# Patient Record
Sex: Male | Born: 1950 | Race: White | Hispanic: No | Marital: Married | Smoking: Former smoker
Health system: Southern US, Community
[De-identification: ages and names within clinical notes are randomized; demographics above are authoritative.]

## PROBLEM LIST (undated history)

## (undated) DIAGNOSIS — I472 Ventricular tachycardia: Secondary | ICD-10-CM

## (undated) DIAGNOSIS — N186 End stage renal disease: Secondary | ICD-10-CM

## (undated) DIAGNOSIS — D497 Neoplasm of unspecified behavior of endocrine glands and other parts of nervous system: Secondary | ICD-10-CM

## (undated) DIAGNOSIS — Z95810 Presence of automatic (implantable) cardiac defibrillator: Secondary | ICD-10-CM

## (undated) DIAGNOSIS — E1142 Type 2 diabetes mellitus with diabetic polyneuropathy: Secondary | ICD-10-CM

## (undated) DIAGNOSIS — E1149 Type 2 diabetes mellitus with other diabetic neurological complication: Secondary | ICD-10-CM

## (undated) DIAGNOSIS — F32A Depression, unspecified: Secondary | ICD-10-CM

## (undated) DIAGNOSIS — I428 Other cardiomyopathies: Secondary | ICD-10-CM

## (undated) DIAGNOSIS — N184 Chronic kidney disease, stage 4 (severe): Secondary | ICD-10-CM

## (undated) DIAGNOSIS — IMO0002 Reserved for concepts with insufficient information to code with codable children: Secondary | ICD-10-CM

## (undated) DIAGNOSIS — F4310 Post-traumatic stress disorder, unspecified: Secondary | ICD-10-CM

## (undated) DIAGNOSIS — R55 Syncope and collapse: Secondary | ICD-10-CM

## (undated) DIAGNOSIS — Z992 Dependence on renal dialysis: Secondary | ICD-10-CM

## (undated) DIAGNOSIS — Z8719 Personal history of other diseases of the digestive system: Secondary | ICD-10-CM

## (undated) DIAGNOSIS — I2510 Atherosclerotic heart disease of native coronary artery without angina pectoris: Secondary | ICD-10-CM

## (undated) DIAGNOSIS — L89153 Pressure ulcer of sacral region, stage 3: Secondary | ICD-10-CM

## (undated) DIAGNOSIS — E669 Obesity, unspecified: Secondary | ICD-10-CM

## (undated) DIAGNOSIS — Z950 Presence of cardiac pacemaker: Secondary | ICD-10-CM

## (undated) DIAGNOSIS — F40240 Claustrophobia: Secondary | ICD-10-CM

## (undated) DIAGNOSIS — D472 Monoclonal gammopathy: Secondary | ICD-10-CM

## (undated) DIAGNOSIS — M1990 Unspecified osteoarthritis, unspecified site: Secondary | ICD-10-CM

## (undated) DIAGNOSIS — J9610 Chronic respiratory failure, unspecified whether with hypoxia or hypercapnia: Secondary | ICD-10-CM

## (undated) DIAGNOSIS — I739 Peripheral vascular disease, unspecified: Secondary | ICD-10-CM

## (undated) DIAGNOSIS — I480 Paroxysmal atrial fibrillation: Secondary | ICD-10-CM

## (undated) DIAGNOSIS — D649 Anemia, unspecified: Secondary | ICD-10-CM

## (undated) DIAGNOSIS — N529 Male erectile dysfunction, unspecified: Secondary | ICD-10-CM

## (undated) DIAGNOSIS — R760 Raised antibody titer: Secondary | ICD-10-CM

## (undated) DIAGNOSIS — J449 Chronic obstructive pulmonary disease, unspecified: Secondary | ICD-10-CM

## (undated) DIAGNOSIS — I219 Acute myocardial infarction, unspecified: Secondary | ICD-10-CM

## (undated) DIAGNOSIS — I639 Cerebral infarction, unspecified: Secondary | ICD-10-CM

## (undated) DIAGNOSIS — M549 Dorsalgia, unspecified: Secondary | ICD-10-CM

## (undated) DIAGNOSIS — I272 Pulmonary hypertension, unspecified: Secondary | ICD-10-CM

## (undated) DIAGNOSIS — D751 Secondary polycythemia: Secondary | ICD-10-CM

## (undated) DIAGNOSIS — E230 Hypopituitarism: Secondary | ICD-10-CM

## (undated) DIAGNOSIS — G4733 Obstructive sleep apnea (adult) (pediatric): Secondary | ICD-10-CM

## (undated) DIAGNOSIS — Z9289 Personal history of other medical treatment: Secondary | ICD-10-CM

## (undated) DIAGNOSIS — E039 Hypothyroidism, unspecified: Secondary | ICD-10-CM

## (undated) DIAGNOSIS — F329 Major depressive disorder, single episode, unspecified: Secondary | ICD-10-CM

## (undated) DIAGNOSIS — E11329 Type 2 diabetes mellitus with mild nonproliferative diabetic retinopathy without macular edema: Secondary | ICD-10-CM

## (undated) DIAGNOSIS — I10 Essential (primary) hypertension: Secondary | ICD-10-CM

## (undated) DIAGNOSIS — C44601 Unspecified malignant neoplasm of skin of unspecified upper limb, including shoulder: Secondary | ICD-10-CM

## (undated) DIAGNOSIS — E7800 Pure hypercholesterolemia, unspecified: Secondary | ICD-10-CM

## (undated) DIAGNOSIS — M542 Cervicalgia: Secondary | ICD-10-CM

## (undated) HISTORY — PX: COLONOSCOPY: SHX174

## (undated) HISTORY — DX: Ventricular tachycardia: I47.2

## (undated) HISTORY — DX: Type 2 diabetes mellitus with mild nonproliferative diabetic retinopathy without macular edema: E11.329

## (undated) HISTORY — PX: CARDIAC CATHETERIZATION: SHX172

## (undated) HISTORY — DX: Paroxysmal atrial fibrillation: I48.0

## (undated) HISTORY — DX: Obstructive sleep apnea (adult) (pediatric): G47.33

## (undated) HISTORY — DX: Depression, unspecified: F32.A

## (undated) HISTORY — DX: Pure hypercholesterolemia, unspecified: E78.00

## (undated) HISTORY — DX: Obesity, unspecified: E66.9

## (undated) HISTORY — PX: CORONARY ANGIOPLASTY: SHX604

## (undated) HISTORY — DX: Cervicalgia: M54.2

## (undated) HISTORY — DX: Reserved for concepts with insufficient information to code with codable children: IMO0002

## (undated) HISTORY — DX: Male erectile dysfunction, unspecified: N52.9

## (undated) HISTORY — DX: Major depressive disorder, single episode, unspecified: F32.9

## (undated) HISTORY — DX: Atherosclerotic heart disease of native coronary artery without angina pectoris: I25.10

## (undated) HISTORY — DX: Hypopituitarism: E23.0

## (undated) HISTORY — DX: Neoplasm of unspecified behavior of endocrine glands and other parts of nervous system: D49.7

## (undated) HISTORY — DX: Secondary polycythemia: D75.1

## (undated) HISTORY — PX: CATARACT EXTRACTION W/ INTRAOCULAR LENS  IMPLANT, BILATERAL: SHX1307

## (undated) HISTORY — DX: Type 2 diabetes mellitus with diabetic polyneuropathy: E11.42

## (undated) HISTORY — PX: INSERT / REPLACE / REMOVE PACEMAKER: SUR710

## (undated) HISTORY — PX: FRACTURE SURGERY: SHX138

## (undated) HISTORY — DX: Dorsalgia, unspecified: M54.9

## (undated) HISTORY — PX: PITUITARY EXCISION: SHX745

## (undated) HISTORY — PX: TONSILLECTOMY: SUR1361

## (undated) HISTORY — DX: Cerebral infarction, unspecified: I63.9

## (undated) HISTORY — DX: Type 2 diabetes mellitus with other diabetic neurological complication: E11.49

## (undated) HISTORY — DX: Personal history of other diseases of the digestive system: Z87.19

## (undated) HISTORY — DX: Monoclonal gammopathy: D47.2

---

## 2001-03-01 ENCOUNTER — Encounter: Admission: RE | Admit: 2001-03-01 | Discharge: 2001-05-30 | Payer: Self-pay | Admitting: *Deleted

## 2003-02-06 ENCOUNTER — Encounter: Admission: RE | Admit: 2003-02-06 | Discharge: 2003-05-07 | Payer: Self-pay | Admitting: *Deleted

## 2003-05-07 ENCOUNTER — Encounter: Admission: RE | Admit: 2003-05-07 | Discharge: 2003-08-05 | Payer: Self-pay | Admitting: *Deleted

## 2005-02-08 DIAGNOSIS — I639 Cerebral infarction, unspecified: Secondary | ICD-10-CM

## 2005-02-08 HISTORY — DX: Cerebral infarction, unspecified: I63.9

## 2005-09-07 ENCOUNTER — Encounter: Admission: RE | Admit: 2005-09-07 | Discharge: 2005-09-07 | Payer: Self-pay | Admitting: Endocrinology

## 2006-02-21 ENCOUNTER — Encounter: Admission: RE | Admit: 2006-02-21 | Discharge: 2006-02-21 | Payer: Self-pay | Admitting: Endocrinology

## 2007-03-17 ENCOUNTER — Encounter: Admission: RE | Admit: 2007-03-17 | Discharge: 2007-03-17 | Payer: Self-pay | Admitting: Endocrinology

## 2007-07-31 ENCOUNTER — Encounter: Admission: RE | Admit: 2007-07-31 | Discharge: 2007-07-31 | Payer: Self-pay | Admitting: Interventional Cardiology

## 2007-10-23 ENCOUNTER — Ambulatory Visit (HOSPITAL_COMMUNITY): Admission: RE | Admit: 2007-10-23 | Discharge: 2007-10-23 | Payer: Self-pay | Admitting: Interventional Cardiology

## 2007-12-19 ENCOUNTER — Encounter: Admission: RE | Admit: 2007-12-19 | Discharge: 2007-12-19 | Payer: Self-pay | Admitting: Neurosurgery

## 2008-07-27 ENCOUNTER — Emergency Department (HOSPITAL_COMMUNITY): Admission: EM | Admit: 2008-07-27 | Discharge: 2008-07-27 | Payer: Self-pay | Admitting: Emergency Medicine

## 2008-09-26 ENCOUNTER — Ambulatory Visit (HOSPITAL_COMMUNITY): Admission: RE | Admit: 2008-09-26 | Discharge: 2008-09-26 | Payer: Self-pay | Admitting: Family Medicine

## 2008-10-24 ENCOUNTER — Encounter: Admission: RE | Admit: 2008-10-24 | Discharge: 2008-10-24 | Payer: Self-pay | Admitting: Orthopedic Surgery

## 2009-12-26 ENCOUNTER — Encounter: Payer: Self-pay | Admitting: Internal Medicine

## 2010-01-21 ENCOUNTER — Ambulatory Visit: Payer: Self-pay | Admitting: Internal Medicine

## 2010-01-21 ENCOUNTER — Encounter: Payer: Self-pay | Admitting: Internal Medicine

## 2010-01-21 DIAGNOSIS — E669 Obesity, unspecified: Secondary | ICD-10-CM

## 2010-01-21 DIAGNOSIS — I5022 Chronic systolic (congestive) heart failure: Secondary | ICD-10-CM | POA: Insufficient documentation

## 2010-01-21 DIAGNOSIS — I428 Other cardiomyopathies: Secondary | ICD-10-CM | POA: Insufficient documentation

## 2010-01-21 DIAGNOSIS — R0602 Shortness of breath: Secondary | ICD-10-CM

## 2010-01-27 ENCOUNTER — Telehealth: Payer: Self-pay | Admitting: Internal Medicine

## 2010-02-05 ENCOUNTER — Encounter: Payer: Self-pay | Admitting: Internal Medicine

## 2010-02-10 ENCOUNTER — Encounter: Payer: Self-pay | Admitting: Internal Medicine

## 2010-02-12 ENCOUNTER — Other Ambulatory Visit: Payer: Self-pay | Admitting: Internal Medicine

## 2010-02-12 ENCOUNTER — Ambulatory Visit
Admission: RE | Admit: 2010-02-12 | Discharge: 2010-02-12 | Payer: Self-pay | Source: Home / Self Care | Attending: Internal Medicine | Admitting: Internal Medicine

## 2010-02-12 LAB — CBC WITH DIFFERENTIAL/PLATELET
Basophils Absolute: 0.1 10*3/uL (ref 0.0–0.1)
Basophils Relative: 1.1 % (ref 0.0–3.0)
Eosinophils Absolute: 0.2 10*3/uL (ref 0.0–0.7)
Eosinophils Relative: 3.6 % (ref 0.0–5.0)
HCT: 39.2 % (ref 39.0–52.0)
Hemoglobin: 13.6 g/dL (ref 13.0–17.0)
Lymphocytes Relative: 33.3 % (ref 12.0–46.0)
Lymphs Abs: 2.2 10*3/uL (ref 0.7–4.0)
MCHC: 34.8 g/dL (ref 30.0–36.0)
MCV: 96.6 fl (ref 78.0–100.0)
Monocytes Absolute: 0.7 10*3/uL (ref 0.1–1.0)
Monocytes Relative: 11 % (ref 3.0–12.0)
Neutro Abs: 3.4 10*3/uL (ref 1.4–7.7)
Neutrophils Relative %: 51 % (ref 43.0–77.0)
Platelets: 189 10*3/uL (ref 150.0–400.0)
RBC: 4.05 Mil/uL — ABNORMAL LOW (ref 4.22–5.81)
RDW: 14.1 % (ref 11.5–14.6)
WBC: 6.6 10*3/uL (ref 4.5–10.5)

## 2010-02-12 LAB — PROTIME-INR
INR: 1 ratio (ref 0.8–1.0)
Prothrombin Time: 10.7 s (ref 9.7–11.8)

## 2010-02-12 LAB — BASIC METABOLIC PANEL
BUN: 35 mg/dL — ABNORMAL HIGH (ref 6–23)
CO2: 30 mEq/L (ref 19–32)
Calcium: 8.7 mg/dL (ref 8.4–10.5)
Chloride: 100 mEq/L (ref 96–112)
Creatinine, Ser: 1.7 mg/dL — ABNORMAL HIGH (ref 0.4–1.5)
GFR: 45.83 mL/min — ABNORMAL LOW (ref >60.00)
Glucose, Bld: 155 mg/dL — ABNORMAL HIGH (ref 70–99)
Potassium: 4.8 mEq/L (ref 3.5–5.1)
Sodium: 137 mEq/L (ref 135–145)

## 2010-02-12 LAB — APTT: aPTT: 24.2 s (ref 21.7–28.8)

## 2010-02-16 ENCOUNTER — Telehealth: Payer: Self-pay | Admitting: Internal Medicine

## 2010-02-19 ENCOUNTER — Ambulatory Visit (HOSPITAL_COMMUNITY)
Admission: RE | Admit: 2010-02-19 | Discharge: 2010-02-20 | Payer: Self-pay | Source: Home / Self Care | Attending: Internal Medicine | Admitting: Internal Medicine

## 2010-02-19 HISTORY — PX: CARDIAC DEFIBRILLATOR PLACEMENT: SHX171

## 2010-02-23 ENCOUNTER — Encounter: Payer: Self-pay | Admitting: Internal Medicine

## 2010-02-23 LAB — GLUCOSE, CAPILLARY
Glucose-Capillary: 151 mg/dL — ABNORMAL HIGH (ref 70–99)
Glucose-Capillary: 159 mg/dL — ABNORMAL HIGH (ref 70–99)
Glucose-Capillary: 125 mg/dL — ABNORMAL HIGH (ref 70–99)
Glucose-Capillary: 235 mg/dL — ABNORMAL HIGH (ref 70–99)
Glucose-Capillary: 162 mg/dL — ABNORMAL HIGH (ref 70–99)
Glucose-Capillary: 225 mg/dL — ABNORMAL HIGH (ref 70–99)

## 2010-02-23 LAB — SURGICAL PCR SCREEN: MRSA, PCR: NEGATIVE

## 2010-03-05 ENCOUNTER — Encounter: Payer: Self-pay | Admitting: Internal Medicine

## 2010-03-05 ENCOUNTER — Ambulatory Visit: Admission: RE | Admit: 2010-03-05 | Discharge: 2010-03-05 | Payer: Self-pay | Source: Home / Self Care

## 2010-03-06 ENCOUNTER — Telehealth: Payer: Self-pay | Admitting: Internal Medicine

## 2010-03-12 NOTE — Progress Notes (Signed)
Summary: question about procedure  Phone Note Call from Patient Call back at Home Phone [phone]   Caller: Patient Summary of Call: question about procedure Initial call taken by: Judie Grieve,  February 16, 2010 4:16 PM  Follow-up for Phone Call        unable to reach pt or leave a message Deliah Goody, RN  February 16, 2010 5:12 PM  Pt returning call Judie Grieve  February 17, 2010 2:20 PM discussed w pt  everything is ok  he will be there at 10 and hold his Insulin and Furosemide prior Dennis Bast, RN, BSN  February 17, 2010 2:26 PM

## 2010-03-12 NOTE — Miscellaneous (Signed)
Summary: Device preload  Clinical Lists Changes  Observations: Added new observation of ICD INDICATN: NICM (02/23/2010 9:21) Added new observation of ICDLEADSTAT2: active (02/23/2010 9:21) Added new observation of ICDLEADSER2: [id number] (02/23/2010 9:21) Added new observation of ICDLEADMOD2: [id number] (02/23/2010 9:21) Added new observation of ICDLEADLOC2: RV (02/23/2010 9:21) Added new observation of ICDLEADSTAT1: active (02/23/2010 9:21) Added new observation of ICDLEADSER1: [id number] (02/23/2010 9:21) Added new observation of ICDLEADMOD1: [id number] (02/23/2010 9:21) Added new observation of ICDLEADLOC1: RA (02/23/2010 9:21) Added new observation of ICD IMP MD: Hillis Range, MD (02/23/2010 9:21) Added new observation of ICD RS STUDY: SJ-4 (02/23/2010 9:21) Added new observation of ICDLEADDOI2: 02/19/2010 (02/23/2010 9:21) Added new observation of ICDLEADDOI1: 02/19/2010 (02/23/2010 9:21) Added new observation of ICD IMPL DTE: 02/19/2010 (02/23/2010 9:21) Added new observation of ICD SERL#: [id number]  (02/23/2010 9:21) Added new observation of ICD MODL#: [id number]  (02/23/2010 8:41) Added new observation of ICDMANUFACTR: St Jude  (02/23/2010 9:21) Added new observation of ICD MD: Hillis Range, MD  (02/23/2010 9:21)       ICD Specifications Following MD:  Hillis Range, MD     ICD Vendor:  St Jude     ICD Model Number:  LK4401     ICD Serial Number:  [id number] ICD DOI:  02/19/2010     ICD Implanting MD:  Hillis Range, MD Research Study Name SJ-4  Lead 1:    Location: RA     DOI: 02/19/2010     Model #: [id number]     Serial #: [id number]     Status: active Lead 2:    Location: RV     DOI: 02/19/2010     Model #: [id number]     Serial #: [id number]     Status: active  Indications::  NICM

## 2010-03-12 NOTE — Letter (Signed)
Summary: Implantable Device Instructions  Architectural technologist, Main Office  [address]   [place], [place] [address]   Phone: [phone]  Fax: [phone]      Implantable Device Instructions  You are scheduled for:   _____ Implantable Cardioverter Defibrillator   on 02/19/2010  with Dr. Johney Frame.  1.  Please arrive at the Short Stay Center at [facility] at 10:00am on the day of your procedure.  2.  Do not eat or drink after midnight the night before your procedure.  3.  Complete lab work on 02/12/2010.    You do not have to be fasting.  4.  You may take your medications with small sip of water   5.  Plan for an overnight stay.  Bring your insurance cards and a list of your medications.  6.  Wash your chest and neck with antibacterial soap (any brand) the evening before and the morning of your procedure.  Rinse well.  7.  Education material received:             ICD _____            *If you have ANY questions after you get home, please call the office [phone]. Anselm Pancoast  *Every attempt is made to prevent procedures from being rescheduled.  Due to the nauture of Electrophysiology, rescheduling can happen.  The physician is always aware and directs the staff when this occurs.    Appended Document: Implantable Device Instructions will hold Furosemide and an Insulin

## 2010-03-12 NOTE — Progress Notes (Signed)
Summary: calling to schedule procedure/ defibrillator  Phone Note Call from Patient Call back at Home Phone [phone] Call back at cell [phone]   Caller: Patient Summary of Call: Pt calling to schedule procedure Initial call taken by: Judie Grieve,  January 27, 2010 2:26 PM  Follow-up for Phone Call        pt would like to talk to a nurse. re  defibrillator procedure. Follow-up by: Roe Coombs,  January 28, 2010 3:51 PM  Additional Follow-up for Phone Call Additional follow up Details #1::        will have OCD impalnat on 02/19/10 labs 02/12/10 at 10:00 Dennis Bast, RN, BSN  January 30, 2010 5:47 PM

## 2010-03-12 NOTE — Progress Notes (Signed)
Summary: question on meds  Phone Note Call from Patient Call back at Home Phone [phone]   Caller: Patient Reason for Call: Talk to Nurse Summary of Call: PT HAS QUESTION RE MEDS. PT IS HAVING SURGERY ON 03/11/10.  Pt needs to know if he can be off his aspirin for five days. Initial call taken by: Roe Coombs,  March 06, 2010 11:52 AM  Follow-up for Phone Call        lmom that pt needs to call his primary cardiologist Dr Frederich Cha but ok from our standpoint Dennis Bast, RN, BSN  March 06, 2010 12:30 PM

## 2010-03-12 NOTE — Cardiovascular Report (Signed)
Summary: Pre-Op Orders  Pre-Op Orders   Imported By: Marylou Mccoy 02/23/2010 15:08:47  _____________________________________________________________________  External Attachment:    Type:   Image     Comment:   External Document

## 2010-03-12 NOTE — Assessment & Plan Note (Signed)
Summary: NEP/EVAL FOR ICD PLACEMENT   Visit Type:  Initial Consult Referring Provider:  Dr Eldridge Dace Primary Provider:  Catha Gosselin, MD   History of Present Illness: Mr Larry [name] is a pleasant 60 yo WM with a h/o nonischemic CM (EF 25%), NYHA CLass II/III CHF, and DM who presents for EP consultation regarding risks of sudden death.  He reports initially being diagnosed with a "weak heart" in 2009.  He underwent LHC at that time which revealed EF 20% with nonobstructive CAD.  He has been treated with optimal medical therapy since that time, but continues to have a depressed ejection fraction. Presently, he remains active, but finds that he is SOB and fatigued upon walkingh 500 yards.  He is also limited by backpain.  He has 1-2 pillow orthopnea and occasional PND.  He reports that this was particularly worse last year.  He denies lower extremity edema.   The patient denies symptoms of palpitations, chest pain, dizziness, presyncope, syncope, or neurologic sequela. The patient is tolerating medications without difficulties and is otherwise without complaint today.   Current Medications (verified): 1)  Spironolactone 25 Mg Tabs (Spironolactone) .... Take One Tablet By Mouth Daily 2)  Caduet 5-40 Mg Tabs (Amlodipine-Atorvastatin) .... Once Daily 3)  Cymbalta 30 Mg Cpep (Duloxetine Hcl) .... 3 Caps Daily 4)  Metoprolol Succinate 100 Mg Xr24h-Tab (Metoprolol Succinate) .... Take One Tablet By Mouth Daily 5)  Androgel Pump 1.25 Gm/act (1%) Gel (Testosterone) .... Uad 6)  Diovan 160 Mg Tabs (Valsartan) .... Take One Tablet By Mouth Daily 7)  Furosemide 80 Mg Tabs (Furosemide) .... Take 2 Tablets By Mouth Daily. 8)  Oxycodone-Acetaminophen 10-325 Mg Tabs (Oxycodone-Acetaminophen) .... Uad 9)  Multivitamins   Tabs (Multiple Vitamin) .... Daily 10)  Aspirin 81 Mg Tbec (Aspirin) .... Take One Tablet By Mouth Daily 11)  Temazepam 30 Mg Caps (Temazepam) .... Uad 12)  Novolog 100 Unit/ml Soln (Insulin  Aspart) .... Uad 13)  Levemir 100 Unit/ml Soln (Insulin Detemir) .... 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   Vital Signs:  Patient profile:   60 year old male Height:      70 inches Weight:      253 pounds BMI:     36.43 Pulse rate:   59 / minute BP sitting:   120 / 82  (left arm)  Vitals Entered By: Laurance Flatten CMA (January 21, 2010 3:28 PM)  Physical Exam  General:  overweight, NAD Head:  normocephalic and atraumatic Eyes:  PERRLA/EOM intact; conjunctiva and lids normal. Mouth:  Teeth, gums and palate normal. Oral mucosa normal. Neck:  supple Lungs:  Clear bilaterally to auscultation and percussion. Heart:  Non-displaced PMI, chest non-tender; regular rate and rhythm, S1, S2 without murmurs, rubs or gallops. Carotid upstroke normal, no bruit. Normal abdominal aortic size, no bruits. Femorals normal pulses, no bruits. Pedals normal pulses. No edema, no varicosities. Abdomen:  Bowel  sounds positive; abdomen soft and non-tender without masses, organomegaly, or hernias noted. No hepatosplenomegaly. Msk:  Back normal, normal gait. Muscle strength and tone normal. Extremities:  No clubbing or cyanosis. Neurologic:  Alert and oriented x 3. Skin:  Intact without lesions or rashes. Psych:  Normal affect.   Echocardiogram  Procedure date:  12/26/2009  Findings:      LVEF 25-30% severe global  LV dysfunction normal RV size and function mild MR biatrial enlargement restrictive physiology   Comments:      Performed by Dr Sheilah Pigeon Cardiology  Echocardiogram  Procedure date:  12/26/2009  Findings:      sinus bradycardia 52 bpm, PR 220, QRS 102, QTc 460, inferior infarction  Echocardiogram  Procedure date:  01/21/2010  Findings:      sinus bradycardia 59 bpm PR 220, QRS 98, QTc 480, infereior infarction, otherwise normal ekg  Impression & Recommendations:  Problem # 1:  CHRONIC SYSTOLIC HEART FAILURE (ICD-428.22) Assessment Deteriorated The patient has a dilated ideopathic CM (EF 25-30%) and NYHA Class II/III CHF.  He meets SCD-HeFT criteria for ICD implantation for primary prevention of sudden death.  Risks, benefits, alternatives to ICD implantation were discussed in detail with the patient today.  He understands that the risks include but are not limited to bleeding, infection, pneumothorax, perforation, tamponade, vascular damage, renal failure, MI, stroke, death, inappropriate shocks, and lead dislodgement.  Given bradycardia, I would anticipate a dual chamber device.  He does not meet indication for resynchronization at this time. The patient states that he has an MRI of his back planned to evaluate back pain.  I have informed him that he cannot have an MRI after ICD implantation.  I have therefore encouraged him to have his back MRI obtained and then contact our office to arrange ICD implantation at the next time.  Patient Instructions: 1)  Your  physician has recommended that you have a defibrillator inserted.  An implantable cardioverter defibrillator (ICD) is a small device that is placed in your chest or, in rare cases, your abdomen. This device uses electrical pulses or shocks to help control life-threatening, irregular heartbeats that could lead the heart to suddenly stop beating (sudden cardiac arrest). Leads are attached to the ICD that goes into your heart. This is done in the hospital and usually requires an overnight stay. Please see the instruction sheet given to you today for more information. 2)  Patient needs MRI of back  wiil call us after he gets this

## 2010-03-18 NOTE — Procedures (Signed)
Summary: wch/st jude/lg   Current Medications (verified): 1)  Spironolactone 25 Mg Tabs (Spironolactone) .... Take One Tablet By Mouth Daily 2)  Caduet 5-40 Mg Tabs (Amlodipine-Atorvastatin) .... Once Daily 3)  Cymbalta 30 Mg Cpep (Duloxetine Hcl) .... 3 Caps Daily 4)  Metoprolol Succinate 100 Mg Xr24h-Tab (Metoprolol Succinate) .... Take One Tablet By Mouth Daily 5)  Androgel Pump 1.25 Gm/act (1%) Gel (Testosterone) .... Uad 6)  Diovan 160 Mg Tabs (Valsartan) .... Take One Tablet By Mouth Daily 7)  Furosemide 80 Mg Tabs (Furosemide) .... Take 2 Tablets By Mouth Daily. 8)  Oxycodone-Acetaminophen 10-325 Mg Tabs (Oxycodone-Acetaminophen) .... Uad 9)  Multivitamins   Tabs (Multiple Vitamin) .... Daily 10)  Aspirin 81 Mg Tbec (Aspirin) .... Take One Tablet By Mouth Daily 11)  Temazepam 30 Mg Caps (Temazepam) .... Uad 12)  Novolog 100 Unit/ml Soln (Insulin Aspart) .... Uad 13)  Levemir 100 Unit/ml Soln (Insulin Detemir) .... Uad  Allergies (verified): No Known Drug Allergies   ICD Specifications Following MD:  Hillis Range, MD     ICD Vendor:  St Jude     ICD Model Number:  [id number]     ICD Serial Number:  [id number] ICD DOI:  02/19/2010     ICD Implanting MD:  Hillis Range, MD Research Study Name SJ-4  Lead 1:    Location: RA     DOI: 02/19/2010     Model #: [id number]     Serial #: [id number]     Status: active Lead 2:    Location: RV     DOI: 02/19/2010     Model #: [id number]     Serial #: [id number]     Status: active  Indications::  NICM   ICD Follow Up Battery Voltage:  95% V     Charge Time:  8.3 seconds     Battery Est. Longevity:  5.2-6.8 yrs Underlying rhythm:  SR   ICD Device Measurements Atrium:  Amplitude: 4.7 mV, Impedance: 480 ohms, Threshold: 0.75 V at 0.5 msec Right Ventricle:  Amplitude: 11.3 mV, Impedance: 490 ohms, Threshold: 0.5 V at 0.5 msec Shock Impedance: 70 ohms   Episodes MS Episodes:  0     Shock:  0     ATP:  0     Nonsustained:  0     Atrial Therapies:  0 Atrial  Pacing:  73%     Ventricular Pacing:  <1%  Brady Parameters Mode DDI     Lower Rate Limit:  60     Upper Rate Limit 350  Tachy Zones VF:  222     VT:  179     Next Cardiology Appt Due:  05/11/2010 Tech Comments:  WOUND CHECK---STERI STRIPS REMOVED.  NO REDNESS OR SWELLING AT SITE.  NORMAL DEVICE FUNCTION. NO CHANGES MADE.  PT FEELS BETTER W/DEVICE--MORE ENERGY. ENROLLED PT IN [place].  ROV IN 3 MTHS W/JA. Vella Kohler  March 05, 2010 9:27 AM

## 2010-03-18 NOTE — Cardiovascular Report (Signed)
Summary: Office Visit   Office Visit   Imported By: Roderic Ovens 03/09/2010 16:05:08  _____________________________________________________________________  External Attachment:    Type:   Image     Comment:   External Document

## 2010-03-19 NOTE — Discharge Summary (Signed)
NAME:  Larry [name], Larry [name] NO.:  [id number]  MEDICAL RECORD NO.:  [id number]          PATIENT TYPE:  OIB  LOCATION:  4739                         FACILITY:  [facility]  PHYSICIAN:  Doylene Canning. Ladona Ridgel, MD    DATE OF BIRTH:  August 13, 1950  DATE OF ADMISSION:  02/19/2010 DATE OF DISCHARGE:  02/20/2010                              DISCHARGE SUMMARY   PRIMARY CARDIOLOGIST:  Corky Crafts, MD  ELECTROPHYSIOLOGIST:  Hillis Range, MD  PRIMARY CARE PROVIDER:  Anna Genre. Little, MD  DISCHARGE DIAGNOSES: 1. Nonischemic cardiomyopathy status post dual-chamber automatic     implantable cardioverter-defibrillator placement this admission,     chronic systolic congestive heart failure. 2. History of nonobstructive coronary artery disease by     catheterization in 2009. 3. History of cerebrovascular accident in 2007, without residual     deficit. 4. Chronic renal insufficiency. 5. Obesity. 6. History of pituitary tumor status post gamma knife surgery at [facility] in 2009. 7. Depression. 8. Erectile dysfunction. 9. Degenerative disk disease. 10.Hypogonadotropic hypogonadism. 11.Obstructive sleep apnea with continuous positive airway pressure     noncompliance. 12.History of neuropathy and retinopathy.  ALLERGIES:  No known drug allergies.  PROCEDURES:  Successful placement of a St. Jude Medical Fortify DR [phone] dual-chamber AICD.  HISTORY OF PRESENT ILLNESS:  A 60 year old male with prior history of nonischemic cardiomyopathy with an EF of approximately 25%.  He has been maintained on optimal medical therapy and most recently follow up with Dr. Johney Frame in clinic on January 21, 2010.  It was determined that given continue reduction of LV function despite medical therapy, the patient met criteria for ICD implantation.  HOSPITAL COURSE:  The patient was sent to the electrophysiologic lab on February 19, 2010, where he underwent successful placement of a St.  Jude Medical Fortify DR [phone] dual-chamber AICD serial [phone].  The patient tolerated procedure well and postprocedure has been ambulating without difficulty.  Postprocedure chest x-ray shows no evidence of pneumothorax and device interrogation shows normal function.  The patient will be discharged home today in good condition.  DISCHARGE LABS:  None.  DISPOSITION:  Patient is being discharged home today in good condition.  FOLLOWUP PLANS AND APPOINTMENTS:  The patient will follow up in [facility] on March 05, 2010, at 9 a.m.  Follow up with Dr. Johney Frame in approximately 3 months.  Follow up with Dr. Eldridge Dace and Dr. Clarene Duke as previously scheduled.  DISCHARGE MEDICATIONS: 1. Lasix 80 mg 2 tablets daily. 2. AndroGel 1.62% 12 pumps daily. 3. Aspirin 81 mg daily. 4. Caduet 5/40 mg daily. 5. Cymbalta 30 mg t.i.d. 6. Diovan 160 mg daily. 7. Levemir 60 units subcu daily. 8. Toprol-XL 100 mg bedtime. 9. Multivitamin 1 tab daily. 10.NovoLog FlexPen sliding scale t.i.d. with meals. 11.Oxycodone/acetaminophen 10/325 mg q.i.d. p.r.n. 12.Spironolactone 25 mg daily. 13.Temazepam 30 mg bedtime.  OUTSTANDING LABORATORY STUDIES:  None.  DURATION OF DISCHARGE ENCOUNTER:  Forty five minutes including physician time.     Nicolasa Ducking, ANP   ______________________________ Doylene Canning. Ladona Ridgel, MD    CB/MEDQ  D:  02/20/2010  T:  02/21/2010  Job:  [id number]  cc:   Corky Crafts, MD Anna Genre Little, M.D.  Electronically Signed by Nicolasa Ducking ANP on 03/09/2010 12:21:38 PM Electronically Signed by Lewayne Bunting MD on 03/19/2010 05:13:56 PM

## 2010-04-01 NOTE — Letter (Signed)
[facility] - 2009-2011  [facility] - 2009-2011   Imported By: Marylou Mccoy 03/23/2010 11:21:38  _____________________________________________________________________  External Attachment:    Type:   Image     Comment:   External Document

## 2010-04-16 ENCOUNTER — Telehealth (INDEPENDENT_AMBULATORY_CARE_PROVIDER_SITE_OTHER): Payer: Self-pay | Admitting: *Deleted

## 2010-04-21 NOTE — Progress Notes (Signed)
  DDS Request Received sent to [facility]. Cala Bradford Mesiemore  April 16, 2010 12:07 PM

## 2010-05-16 LAB — GLUCOSE, CAPILLARY: Glucose-Capillary: 269 mg/dL — ABNORMAL HIGH (ref 70–99)

## 2010-05-18 LAB — D-DIMER, QUANTITATIVE: D-Dimer, Quant: 0.48 ug/mL-FEU (ref 0.00–0.48)

## 2010-06-03 ENCOUNTER — Encounter: Payer: Self-pay | Admitting: Internal Medicine

## 2010-06-04 ENCOUNTER — Encounter: Payer: Self-pay | Admitting: Internal Medicine

## 2010-06-04 ENCOUNTER — Ambulatory Visit (INDEPENDENT_AMBULATORY_CARE_PROVIDER_SITE_OTHER): Payer: 59 | Admitting: Internal Medicine

## 2010-06-04 VITALS — BP 118/78 | HR 68 | Resp 18 | Ht 70.0 in | Wt 258.1 lb

## 2010-06-04 DIAGNOSIS — I428 Other cardiomyopathies: Secondary | ICD-10-CM

## 2010-06-04 DIAGNOSIS — I4891 Unspecified atrial fibrillation: Secondary | ICD-10-CM

## 2010-06-04 DIAGNOSIS — I10 Essential (primary) hypertension: Secondary | ICD-10-CM | POA: Insufficient documentation

## 2010-06-04 DIAGNOSIS — E669 Obesity, unspecified: Secondary | ICD-10-CM

## 2010-06-04 DIAGNOSIS — I4819 Other persistent atrial fibrillation: Secondary | ICD-10-CM | POA: Insufficient documentation

## 2010-06-04 DIAGNOSIS — I480 Paroxysmal atrial fibrillation: Secondary | ICD-10-CM

## 2010-06-04 DIAGNOSIS — I5022 Chronic systolic (congestive) heart failure: Secondary | ICD-10-CM

## 2010-06-04 HISTORY — DX: Paroxysmal atrial fibrillation: I48.0

## 2010-06-04 LAB — BASIC METABOLIC PANEL
BUN: 46 mg/dL — ABNORMAL HIGH (ref 6–23)
CO2: 30 mEq/L (ref 19–32)
Calcium: 9.1 mg/dL (ref 8.4–10.5)
Chloride: 101 mEq/L (ref 96–112)
Creatinine, Ser: 1.6 mg/dL — ABNORMAL HIGH (ref 0.4–1.5)
GFR: 48.13 mL/min — ABNORMAL LOW (ref >60.00)
Glucose, Bld: 119 mg/dL — ABNORMAL HIGH (ref 70–99)
Potassium: 4.7 mEq/L (ref 3.5–5.1)
Sodium: 140 mEq/L (ref 135–145)

## 2010-06-04 MED ORDER — RIVAROXABAN 20 MG PO TABS: 20.0000 mg | ORAL_TABLET | Freq: Every day | ORAL | Status: DC

## 2010-06-04 NOTE — Assessment & Plan Note (Signed)
Weight loss advised 

## 2010-06-04 NOTE — Patient Instructions (Signed)
Your physician recommends that you schedule a follow-up appointment in: Jan 2013 with Dr Johney Frame  Your physician has recommended you make the following change in your medication: start Xarelto 20mg  daily  Your physician recommends that you return for lab work in: [facility]

## 2010-06-04 NOTE — Progress Notes (Signed)
The patient presents today for routine electrophysiology followup.  Since last being seen in our clinic, the patient reports doing very well.  He continues to have dyspnea with moderate activity.  His ICD has healed without difficulty.  Though his ICD reveals episodes of afib, he is unaware of this.  Today, he denies symptoms of palpitations, chest pain, orthopnea, PND, lower extremity edema, dizziness, presyncope, syncope, or neurologic sequela.  The patient feels that he is tolerating medications without difficulties and is otherwise without complaint today.   Past Medical History  Diagnosis Date  . Cardiomyopathy     nonischemic (EF 25%)  . CAD (coronary artery disease)     nonobstructive CAD by cath 2009  . Obesity   . CVA (cerebral vascular accident)     CVA 2007 without residual deficit  . CRI (chronic renal insufficiency)     CRI (baseline creatinine 1.6)  . Pituitary tumor      (nonfunctionging pituitary microadenoma) s/p gamma knife surgery at [facility] 2009 with neuropathy and retinopathy  . Depression   . Erectile dysfunction   . DDD (degenerative disc disease)   . OSA (obstructive sleep apnea)   . Monoclonal gammopathy   . Atrial fibrillation 06/04/10    paroxysmal   Past Surgical History  Procedure Date  . Gamma knife surgery for pituitary tumor   . Tonsillectomy   . Back surgery   . Cardiac defibrillator placement 02/19/10    by [facility]    Current Outpatient Prescriptions  Medication Sig Dispense Refill  . amLODipine-atorvastatin (CADUET) 5-40 MG per tablet Take 1 tablet by mouth daily.        [name] aspirin 81 MG tablet Take 81 mg by mouth daily.        . DULoxetine (CYMBALTA) 30 MG capsule Take 30 mg by mouth 3 (three) times daily.        . furosemide (LASIX) 80 MG tablet 2 tab po qd       . insulin aspart (NOVOLOG) 100 UNIT/ML injection Inject into the skin as directed.        . insulin detemir (LEVEMIR) 100 UNIT/ML injection Inject into the skin as directed.        .  metoprolol (TOPROL-XL) 100 MG 24 hr tablet Take 100 mg by mouth daily.        . Multiple Vitamin (MULTIVITAMIN) capsule Take 1 capsule by mouth daily.        [name] oxyCODONE-acetaminophen (PERCOCET) 10-325 MG per tablet Take 1 tablet by mouth every 4 (four) hours as needed.        [name] spironolactone (ALDACTONE) 25 MG tablet Take 25 mg by mouth daily.        . temazepam (RESTORIL) 30 MG capsule Take 30 mg by mouth at bedtime as needed.        . Testosterone (ANDROGEL PUMP TD) Place onto the skin.        . valsartan (DIOVAN) 160 MG tablet Take 160 mg by mouth daily.        . Rivaroxaban (XARELTO) 20 MG TABS Take 20 mg by mouth daily.  90 tablet  3    Allergies not on file  History   Social History  . Marital Status: Married    Spouse Name: N/A    Number of Children: N/A  . Years of Education: N/A   Occupational History  . Not on file.   Social History Main Topics  . Smoking status: Former Smoker    Types:  Cigars  . Smokeless tobacco: Not on file   Comment: 25 yrs ago  . Alcohol Use: No  . Drug Use: No  . Sexually Active: Not on file   Other Topics Concern  . Not on file   Social History Narrative  . No narrative on file    Family History  Problem Relation Age of Onset  . Lung cancer      ROS-  All systems are reviewed and are negative except as outlined in the HPI above    Physical Exam: Filed Vitals:   06/04/10 1210  BP: 118/78  Pulse: 68  Resp: 18  Height: 5\' 10"  (1.778 m)  Weight: 258 lb 1.9 oz (117.082 kg)    GEN- The patient is well appearing, alert and oriented x 3 today.   Head- normocephalic, atraumatic Eyes-  Sclera clear, conjunctiva pink Ears- hearing intact Oropharynx- clear Neck- supple, no JVP Lymph- no cervical lymphadenopathy Lungs- Clear to ausculation bilaterally, normal work of breathing Chest- ICD pocket is well healed Heart- Regular rate and rhythm, no murmurs, rubs or gallops, PMI not laterally displaced GI- soft, NT, ND, +  BS Extremities- no clubbing, cyanosis, or edema MS- no significant deformity or atrophy Skin- no rash or lesion Psych- euthymic mood, full affect Neuro- strength and sensation are intact  ICD interrogation- reviewed in detail today,  See PACEART report  Assessment and Plan:

## 2010-06-04 NOTE — Assessment & Plan Note (Signed)
ICD interrogation today reveals episodes of atrial fibrillation.  He is rate controlled and minimally symptomatic.  We will therefore not start AAD at this time. His CHADS2 score is 2 (CHF and HTN).  AHA/ACC guidelines therefore recommend anticoagulation with coumadin or a novel anticoagulant.  We discussed the pros and cons of warfarin, pradaxa, and xarelto at [facility] today.  The patient is clear that his desire is to start Xarelto.  We will check CrCl today and start Xarelto 20mg  daily if CrCl > 50.

## 2010-06-04 NOTE — Assessment & Plan Note (Signed)
Stable No change required today We will check BMET today

## 2010-06-04 NOTE — Assessment & Plan Note (Signed)
Stable on good medical therapy Normal device function See Pace Art report No changes today

## 2010-06-05 ENCOUNTER — Other Ambulatory Visit: Payer: Self-pay | Admitting: Family Medicine

## 2010-06-08 ENCOUNTER — Other Ambulatory Visit: Payer: Self-pay | Admitting: *Deleted

## 2010-06-08 DIAGNOSIS — I4891 Unspecified atrial fibrillation: Secondary | ICD-10-CM

## 2010-06-08 DIAGNOSIS — I10 Essential (primary) hypertension: Secondary | ICD-10-CM

## 2010-06-08 DIAGNOSIS — I428 Other cardiomyopathies: Secondary | ICD-10-CM

## 2010-06-08 DIAGNOSIS — E669 Obesity, unspecified: Secondary | ICD-10-CM

## 2010-06-08 DIAGNOSIS — I5022 Chronic systolic (congestive) heart failure: Secondary | ICD-10-CM

## 2010-06-08 MED ORDER — RIVAROXABAN 20 MG PO TABS: 20.0000 mg | ORAL_TABLET | Freq: Every day | ORAL | Status: DC

## 2010-07-19 ENCOUNTER — Emergency Department (HOSPITAL_BASED_OUTPATIENT_CLINIC_OR_DEPARTMENT_OTHER)
Admission: EM | Admit: 2010-07-19 | Discharge: 2010-07-20 | Disposition: A | Discharge status: Home / Self Care | Payer: 59 | Attending: Emergency Medicine | Admitting: Emergency Medicine

## 2010-07-19 DIAGNOSIS — M549 Dorsalgia, unspecified: Secondary | ICD-10-CM | POA: Insufficient documentation

## 2010-07-19 DIAGNOSIS — G8929 Other chronic pain: Secondary | ICD-10-CM | POA: Insufficient documentation

## 2010-07-19 DIAGNOSIS — K5732 Diverticulitis of large intestine without perforation or abscess without bleeding: Secondary | ICD-10-CM | POA: Insufficient documentation

## 2010-07-19 DIAGNOSIS — M542 Cervicalgia: Secondary | ICD-10-CM | POA: Insufficient documentation

## 2010-07-19 DIAGNOSIS — Z794 Long term (current) use of insulin: Secondary | ICD-10-CM | POA: Insufficient documentation

## 2010-07-19 DIAGNOSIS — E119 Type 2 diabetes mellitus without complications: Secondary | ICD-10-CM | POA: Insufficient documentation

## 2010-07-19 DIAGNOSIS — I10 Essential (primary) hypertension: Secondary | ICD-10-CM | POA: Insufficient documentation

## 2010-07-19 DIAGNOSIS — Z79899 Other long term (current) drug therapy: Secondary | ICD-10-CM | POA: Insufficient documentation

## 2010-07-20 ENCOUNTER — Emergency Department (INDEPENDENT_AMBULATORY_CARE_PROVIDER_SITE_OTHER): Payer: 59

## 2010-07-20 DIAGNOSIS — K5732 Diverticulitis of large intestine without perforation or abscess without bleeding: Secondary | ICD-10-CM

## 2010-07-20 DIAGNOSIS — R109 Unspecified abdominal pain: Secondary | ICD-10-CM

## 2010-07-20 LAB — DIFFERENTIAL
Lymphocytes Relative: 9 % — ABNORMAL LOW (ref 12–46)
Lymphs Abs: 1 10*3/uL (ref 0.7–4.0)
Monocytes Absolute: 1.3 10*3/uL — ABNORMAL HIGH (ref 0.1–1.0)
Monocytes Relative: 11 % (ref 3–12)
Neutro Abs: 9.2 10*3/uL — ABNORMAL HIGH (ref 1.7–7.7)

## 2010-07-20 LAB — CBC
HCT: 41.4 % (ref 39.0–52.0)
Hemoglobin: 14.3 g/dL (ref 13.0–17.0)
MCH: 32.9 pg (ref 26.0–34.0)
MCHC: 34.5 g/dL (ref 30.0–36.0)
MCV: 95.4 fL (ref 78.0–100.0)

## 2010-07-20 LAB — COMPREHENSIVE METABOLIC PANEL
Alkaline Phosphatase: 75 U/L (ref 39–117)
BUN: 24 mg/dL — ABNORMAL HIGH (ref 6–23)
Calcium: 9.7 mg/dL (ref 8.4–10.5)
GFR calc Af Amer: 58 mL/min — ABNORMAL LOW (ref >60)
Glucose, Bld: 327 mg/dL — ABNORMAL HIGH (ref 70–99)
Potassium: 4.5 mEq/L (ref 3.5–5.1)
Total Protein: 7.5 g/dL (ref 6.0–8.3)

## 2010-07-20 LAB — URINALYSIS, ROUTINE W REFLEX MICROSCOPIC
Glucose, UA: >1000 mg/dL — AB
Hgb urine dipstick: NEGATIVE
Ketones, ur: 15 mg/dL — AB
Protein, ur: 30 mg/dL — AB
Urobilinogen, UA: 0.2 mg/dL (ref 0.0–1.0)

## 2010-07-20 LAB — LIPASE, BLOOD: Lipase: 14 U/L (ref 11–59)

## 2010-07-20 LAB — URINE MICROSCOPIC-ADD ON

## 2010-07-20 MED ORDER — IOHEXOL 300 MG/ML  SOLN
100.0000 mL | Freq: Once | INTRAMUSCULAR | Status: AC | PRN
  Administered 2010-07-20: 100 mL via INTRAVENOUS

## 2010-09-09 ENCOUNTER — Other Ambulatory Visit: Payer: Self-pay | Admitting: Physical Medicine and Rehabilitation

## 2010-09-09 DIAGNOSIS — M545 Low back pain, unspecified: Secondary | ICD-10-CM

## 2010-09-09 DIAGNOSIS — IMO0002 Reserved for concepts with insufficient information to code with codable children: Secondary | ICD-10-CM

## 2010-09-11 ENCOUNTER — Ambulatory Visit
Admission: RE | Admit: 2010-09-11 | Discharge: 2010-09-11 | Disposition: A | Discharge status: Home / Self Care | Payer: 59 | Source: Ambulatory Visit | Attending: Physical Medicine and Rehabilitation | Admitting: Physical Medicine and Rehabilitation

## 2010-09-11 DIAGNOSIS — M545 Low back pain: Secondary | ICD-10-CM

## 2010-09-11 DIAGNOSIS — IMO0002 Reserved for concepts with insufficient information to code with codable children: Secondary | ICD-10-CM

## 2010-09-11 MED ORDER — IOHEXOL 180 MG/ML  SOLN
1.0000 mL | Freq: Once | INTRAMUSCULAR | Status: AC | PRN
  Administered 2010-09-11: 1 mL via EPIDURAL

## 2010-09-11 MED ORDER — METHYLPREDNISOLONE ACETATE 40 MG/ML INJ SUSP (RADIOLOG
120.0000 mg | Freq: Once | INTRAMUSCULAR | Status: AC
  Administered 2010-09-11: 120 mg via EPIDURAL

## 2010-11-11 LAB — GLUCOSE, CAPILLARY
Glucose-Capillary: 134 — ABNORMAL HIGH
Glucose-Capillary: 151 — ABNORMAL HIGH

## 2010-12-16 ENCOUNTER — Emergency Department (HOSPITAL_BASED_OUTPATIENT_CLINIC_OR_DEPARTMENT_OTHER)
Admission: EM | Admit: 2010-12-16 | Discharge: 2010-12-16 | Disposition: A | Discharge status: Home / Self Care | Payer: 59 | Attending: Emergency Medicine | Admitting: Emergency Medicine

## 2010-12-16 ENCOUNTER — Encounter (HOSPITAL_BASED_OUTPATIENT_CLINIC_OR_DEPARTMENT_OTHER): Payer: Self-pay | Admitting: *Deleted

## 2010-12-16 DIAGNOSIS — M5412 Radiculopathy, cervical region: Secondary | ICD-10-CM | POA: Insufficient documentation

## 2010-12-16 DIAGNOSIS — N189 Chronic kidney disease, unspecified: Secondary | ICD-10-CM | POA: Insufficient documentation

## 2010-12-16 DIAGNOSIS — E669 Obesity, unspecified: Secondary | ICD-10-CM | POA: Insufficient documentation

## 2010-12-16 DIAGNOSIS — F3289 Other specified depressive episodes: Secondary | ICD-10-CM | POA: Insufficient documentation

## 2010-12-16 DIAGNOSIS — G4733 Obstructive sleep apnea (adult) (pediatric): Secondary | ICD-10-CM | POA: Insufficient documentation

## 2010-12-16 DIAGNOSIS — M542 Cervicalgia: Secondary | ICD-10-CM | POA: Insufficient documentation

## 2010-12-16 DIAGNOSIS — I2510 Atherosclerotic heart disease of native coronary artery without angina pectoris: Secondary | ICD-10-CM | POA: Insufficient documentation

## 2010-12-16 DIAGNOSIS — F329 Major depressive disorder, single episode, unspecified: Secondary | ICD-10-CM | POA: Insufficient documentation

## 2010-12-16 DIAGNOSIS — I4891 Unspecified atrial fibrillation: Secondary | ICD-10-CM | POA: Insufficient documentation

## 2010-12-16 MED ORDER — HYDROMORPHONE HCL PF 2 MG/ML IJ SOLN
2.0000 mg | Freq: Once | INTRAMUSCULAR | Status: AC
Start: 2010-12-16 — End: 2010-12-16
  Administered 2010-12-16: 2 mg via INTRAMUSCULAR
  Filled 2010-12-16: qty 1

## 2010-12-16 MED ORDER — OXYCODONE HCL 20 MG PO TB12: 20.0000 mg | ORAL_TABLET | Freq: Two times a day (BID) | ORAL | Status: AC

## 2010-12-16 NOTE — ED Notes (Signed)
Pt took oxycodone 1 tablet around 1am for pain relief.  Pt states has chronic mid-low back pain.  Pt rates pain as 10/10 currently

## 2010-12-16 NOTE — ED Notes (Signed)
Pt presents to ED today with c/o head and neck pain that started about 1 week ago.  Pt states "I just got tired of waiting for it to get better"  Pt reports no injuries

## 2010-12-16 NOTE — ED Provider Notes (Addendum)
History     CSN: [id number] Arrival date & time: 12/16/2010  5:40 AM   First MD Initiated Contact with Patient 12/16/10 0543      Chief Complaint  Patient presents with  .  Neck pain     (Consider location/radiation/quality/duration/timing/severity/associated sxs/prior treatment) HPI This is a 60 year old white male with history of chronic lower back pain. He complains of a about a week's history of pain in about the left C5 dermatomal distribution. The pain is sharp, has become severe and is worse with movement of the neck or palpation of the left neck or shoulder. There is no associated numbness or weakness. He has not had an injury to his neck. The pain has prevented him from sleeping the past several days. As an incidental note he had laser surgery on his left eye yesterday.  Past Medical History  Diagnosis Date  . Cardiomyopathy     nonischemic (EF 25%)  . CAD (coronary artery disease)     nonobstructive CAD by cath 2009  . Obesity   . CVA (cerebral vascular accident)     CVA 2007 without residual deficit  . CRI (chronic renal insufficiency)     CRI (baseline creatinine 1.6)  . 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 Depression   . Erectile dysfunction   . DDD (degenerative disc disease)   . OSA (obstructive sleep apnea)   . Monoclonal gammopathy   . Atrial fibrillation 06/04/10    paroxysmal    Past Surgical History  Procedure Date  . Gamma knife surgery for pituitary tumor   . Tonsillectomy   . Back surgery   . Cardiac defibrillator placement 02/19/10    by Fawn Kirk    Family History  Problem Relation Age of Onset  . Lung cancer      History  Substance Use Topics  . Smoking status: Former Smoker    Types: Cigars  . Smokeless tobacco: Not on file   Comment: 25 yrs ago  . Alcohol Use: No      Review of Systems  All other systems reviewed and are  negative.    Allergies  Review of patient's allergies indicates no known allergies.  Home Medications   Current Outpatient Rx  Name Route Sig Dispense Refill  . AMLODIPINE-ATORVASTATIN 5-40 MG PO TABS  TAKE 1 TABLET DAILY 90 tablet 2  . ASPIRIN 81 MG PO TABS Oral Take 81 mg by mouth daily.      . DULOXETINE HCL 30 MG PO CPEP Oral Take 30 mg by mouth 3 (three) times daily.      . FUROSEMIDE 80 MG PO TABS  2 tab po qd     . INSULIN ASPART 100 UNIT/ML [place] SOLN Subcutaneous Inject into the skin as directed.      . INSULIN DETEMIR 100 UNIT/ML [place] SOLN Subcutaneous Inject into the skin as directed.      [name] METOPROLOL SUCCINATE 100 MG PO TB24 Oral Take 100 mg by mouth daily.      . MULTIVITAMINS PO CAPS Oral Take 1 capsule by mouth daily.      . OXYCODONE-ACETAMINOPHEN 10-325 MG PO TABS Oral Take 1 tablet by mouth every 4 (four) hours as needed.      [name] RIVAROXABAN 20 MG PO TABS Oral Take 20 mg by mouth daily. 90 tablet 3  . SPIRONOLACTONE 25 MG PO TABS Oral Take 25 mg by  mouth daily.      [name] TEMAZEPAM 30 MG PO CAPS Oral Take 30 mg by mouth at bedtime as needed.      . ANDROGEL PUMP TD Transdermal Place onto the skin.      [name] VALSARTAN 160 MG PO TABS Oral Take 160 mg by mouth daily.        BP 133/81  Pulse 82  Temp(Src) 98.2 F (36.8 C) (Oral)  Resp 18  SpO2 93%  Physical Exam General: Well-developed, well-nourished male in no acute distress; appearance consistent with age of record HENT: normocephalic, atraumatic Eyes: Normal appearance; right pupil 3 mm and reactive, left pupil 4 mm and sluggish; extraocular muscles intact Neck: Decreased range of motion due to pain; neck pressure being held slightly rotated to the left; moderate to severe tenderness on the left side of the neck and shoulder in about the C5 dermatome Heart: regular rate and rhythm Lungs: clear to auscultation bilaterally Abdomen: soft; nondistended Extremities: No deformity; full range of motion Neurologic: Awake,  alert and oriented;motor function intact in all extremities and symmetric; no facial droop Skin: Warm and dry Psychiatric: Flat    ED Course  Procedures (including critical care time)     MDM  The patient is established with Dr. Ethelene Hal. He has an appointment pending but requests pain relief in the meantime.        Hanley Seamen, MD 12/16/10 0600  Hanley Seamen, MD 12/16/10 [phone]

## 2011-01-22 ENCOUNTER — Other Ambulatory Visit: Payer: Self-pay | Admitting: Orthopedic Surgery

## 2011-01-22 ENCOUNTER — Telehealth: Payer: Self-pay | Admitting: Internal Medicine

## 2011-01-22 DIAGNOSIS — M4802 Spinal stenosis, cervical region: Secondary | ICD-10-CM

## 2011-01-22 DIAGNOSIS — M545 Low back pain: Secondary | ICD-10-CM

## 2011-01-22 NOTE — Telephone Encounter (Signed)
Walk in pt Form " Pt Would Like Permission to Stop His Blood Medication" sent to [facility]  01/22/11/km

## 2011-01-26 ENCOUNTER — Other Ambulatory Visit: Payer: 59

## 2011-01-27 ENCOUNTER — Ambulatory Visit
Admission: RE | Admit: 2011-01-27 | Discharge: 2011-01-27 | Disposition: A | Discharge status: Home / Self Care | Payer: 59 | Source: Ambulatory Visit | Attending: Orthopedic Surgery | Admitting: Orthopedic Surgery

## 2011-01-27 VITALS — BP 135/72 | HR 102

## 2011-01-27 DIAGNOSIS — M545 Low back pain: Secondary | ICD-10-CM

## 2011-01-27 DIAGNOSIS — M4802 Spinal stenosis, cervical region: Secondary | ICD-10-CM

## 2011-01-27 MED ORDER — IOHEXOL 300 MG/ML  SOLN
10.0000 mL | Freq: Once | INTRAMUSCULAR | Status: AC | PRN
  Administered 2011-01-27: 10 mL via INTRATHECAL

## 2011-01-27 MED ORDER — DIAZEPAM 5 MG PO TABS
10.0000 mg | ORAL_TABLET | Freq: Once | ORAL | Status: AC
  Administered 2011-01-27: 10 mg via ORAL

## 2011-01-27 MED ORDER — ONDANSETRON HCL 4 MG/2ML IJ SOLN
4.0000 mg | Freq: Once | INTRAMUSCULAR | Status: AC
  Administered 2011-01-27: 4 mg via INTRAMUSCULAR

## 2011-01-27 MED ORDER — HYDROMORPHONE HCL PF 2 MG/ML IJ SOLN
2.0000 mg | Freq: Once | INTRAMUSCULAR | Status: AC
  Administered 2011-01-27: 2 mg via INTRAMUSCULAR

## 2011-01-27 NOTE — Progress Notes (Signed)
Patient states he has been off Xarelto for the past day and Cymbalta for the past two days.   jkl

## 2011-01-27 NOTE — Patient Instructions (Signed)
Myelogram  Discharge Instructions  1. Go home and rest quietly for the next 24 hours.  It is important to lie flat for the next 24 hours.  Get up only to go to the restroom.  You may lie in the bed or on a couch on your back, your stomach, your left side or your right side.  You may have one pillow under your head.  You may have pillows between your knees while you are on your side or under your knees while you are on your back.  2. DO NOT drive today.  Recline the seat as far back as it will go, while still wearing your seat belt, on the way home.  3. You may get up to go to the bathroom as needed.  You may sit up for 10 minutes to eat.  You may resume your normal diet and medications unless otherwise indicated.  4. The incidence of headache, nausea, or vomiting is about 5% (one in 20 patients).  If you develop a headache, lie flat and drink plenty of fluids until the headache goes away.  Caffeinated beverages may be helpful.  If you develop severe nausea and vomiting or a headache that does not go away with flat bed rest, call [phone].  5. You may resume normal activities after your 24 hours of bed rest is over; however, do not exert yourself strongly or do any heavy lifting tomorrow.  6. Call your physician for a follow-up appointment.  The results of your myelogram will be sent directly to your physician by the following day.  7. If you have any questions or if complications develop after you arrive home, please call [phone].  Discharge instructions have been explained to the patient.  The patient, or the person responsible for the patient, fully understands these instructions.   Resume xarelto today. Resume cymbalta on 01/28/11, after 9:30 am.

## 2011-02-07 ENCOUNTER — Other Ambulatory Visit: Payer: Self-pay | Admitting: Family Medicine

## 2011-02-15 ENCOUNTER — Ambulatory Visit (INDEPENDENT_AMBULATORY_CARE_PROVIDER_SITE_OTHER): Payer: 59 | Admitting: Internal Medicine

## 2011-02-15 ENCOUNTER — Encounter: Payer: Self-pay | Admitting: Internal Medicine

## 2011-02-15 DIAGNOSIS — I5022 Chronic systolic (congestive) heart failure: Secondary | ICD-10-CM

## 2011-02-15 DIAGNOSIS — I10 Essential (primary) hypertension: Secondary | ICD-10-CM

## 2011-02-15 DIAGNOSIS — I428 Other cardiomyopathies: Secondary | ICD-10-CM

## 2011-02-15 DIAGNOSIS — I4891 Unspecified atrial fibrillation: Secondary | ICD-10-CM

## 2011-02-15 LAB — ICD DEVICE OBSERVATION
AL AMPLITUDE: 2.8 mv
AL IMPEDENCE ICD: 450 Ohm
ATRIAL PACING ICD: 22 pct
DEVICE MODEL ICD: 622169
FVT: 0
HV IMPEDENCE: 65 Ohm
PACEART VT: 0
RV LEAD AMPLITUDE: 11.6 mv
RV LEAD IMPEDENCE ICD: 412.5 Ohm
TOT-0008: 0
TOT-0009: 1
TOT-0010: 4
TZON-0003SLOWVT: 335 ms
TZON-0010SLOWVT: 40 ms
VF: 0

## 2011-02-15 MED ORDER — METOPROLOL SUCCINATE ER 100 MG PO TB24: ORAL_TABLET | ORAL | Status: DC

## 2011-02-15 NOTE — Assessment & Plan Note (Signed)
Stable No change required today  

## 2011-02-15 NOTE — Assessment & Plan Note (Signed)
Normal ICD function See Pace Art report No changes today  Merlin checks every 3 months, return in 12 months

## 2011-02-15 NOTE — Progress Notes (Signed)
The patient presents today for routine electrophysiology followup.  Since last being seen in our clinic, the patient reports doing very well. He remains asymptomatic with afib.  Today, he denies symptoms of palpitations, chest pain, orthopnea, PND, lower extremity edema, dizziness, presyncope, syncope, or neurologic sequela.  His exertional SOB is stable. The patient feels that he is tolerating medications without difficulties and is otherwise without complaint today.   Past Medical History  Diagnosis Date  . Cardiomyopathy     nonischemic (EF 25%)  . CAD (coronary artery disease)     nonobstructive CAD by cath 2009  . Obesity   . CVA (cerebral vascular accident)     CVA 2007 without residual deficit  . CRI (chronic renal insufficiency)     CRI (baseline creatinine 1.6)  . 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 Depression   . Erectile dysfunction   . DDD (degenerative disc disease)   . OSA (obstructive sleep apnea)   . Monoclonal gammopathy   . Atrial fibrillation 06/04/10    paroxysmal   Past Surgical History  Procedure Date  . Gamma knife surgery for pituitary tumor   . Tonsillectomy   . Back surgery   . Cardiac defibrillator placement 02/19/10    by [facility]    Current Outpatient Prescriptions  Medication Sig Dispense Refill  . amLODipine-atorvastatin (CADUET) 5-40 MG per tablet TAKE 1 TABLET DAILY  90 tablet  2  . DULoxetine (CYMBALTA) 30 MG capsule 4 TIMES PO QD      . furosemide (LASIX) 80 MG tablet 2 tab po qd       . insulin aspart (NOVOLOG) 100 UNIT/ML injection Inject into the skin as directed.        . insulin detemir (LEVEMIR) 100 UNIT/ML injection Inject into the skin as directed.        . metoprolol (TOPROL-XL) 100 MG 24 hr tablet Take one tablet by mouth in the morning and take 1/2 tablet by mouth at night  135 tablet  3  . Multiple Vitamin (MULTIVITAMIN) capsule Take  1 capsule by mouth daily.        [name] oxyCODONE-acetaminophen (PERCOCET) 10-325 MG per tablet Take 1 tablet by mouth every 4 (four) hours as needed.        . Rivaroxaban (XARELTO) 20 MG TABS Take 20 mg by mouth daily.  90 tablet  3  . spironolactone (ALDACTONE) 25 MG tablet Take 25 mg by mouth daily.        . temazepam (RESTORIL) 30 MG capsule Take 30 mg by mouth at bedtime as needed.        . Testosterone (ANDROGEL PUMP TD) Place onto the skin.        . valsartan (DIOVAN) 160 MG tablet Take 160 mg by mouth daily.        [name] DISCONTD: metoprolol (TOPROL-XL) 100 MG 24 hr tablet Take 100 mg by mouth daily.          No Known Allergies  History   Social History  . Marital Status: Married    Spouse Name: N/A    Number of Children: N/A  . Years of Education: N/A   Occupational History  . Not on file.   Social History Main Topics  . Smoking status: Former Smoker    Types: Cigars  . Smokeless tobacco: Not on file   Comment: 25 yrs ago  . Alcohol  Use: No  . Drug Use: No  . Sexually Active: Not on file   Other Topics Concern  . Not on file   Social History Narrative  . No narrative on file    Family History  Problem Relation Age of Onset  . Lung cancer      ROS-  All systems are reviewed and are negative except as outlined in the HPI above    Physical Exam: Filed Vitals:   02/15/11 1222  BP: 120/78  Pulse: 70  Height: 5\' 10"  (1.778 m)  Weight: 266 lb (120.657 kg)    GEN- The patient is well appearing, alert and oriented x 3 today.   Head- normocephalic, atraumatic Eyes-  Sclera clear, conjunctiva pink Ears- hearing intact Oropharynx- clear Neck- supple, no JVP Lymph- no cervical lymphadenopathy Lungs- Clear to ausculation bilaterally, normal work of breathing Chest- ICD pocket is well healed Heart- Regular rate and rhythm, no murmurs, rubs or gallops, PMI not laterally displaced GI- soft, NT, ND, + BS Extremities- no clubbing, cyanosis, or edema MS- no significant  deformity or atrophy Skin- no rash or lesion Psych- euthymic mood, full affect Neuro- strength and sensation are intact  ICD interrogation- reviewed in detail today,  See PACEART report  Assessment and Plan:

## 2011-02-15 NOTE — Assessment & Plan Note (Signed)
By ICD interrogation, afib burden is low (2%) however during afib, his has significantly elevated heart rates  Increase toprol XL to 150mg  daily Continue xarelto for stroke prevention

## 2011-02-15 NOTE — Patient Instructions (Signed)
Remote monitoring is used to monitor your Pacemaker of ICD from home. This monitoring reduces the number of office visits required to check your device to one time per year. It allows [place] to keep an eye on the functioning of your device to ensure it is working properly. You are scheduled for a device check from home on 05/20/2011. You may send your transmission at any time that day. If you have a wireless device, the transmission will be sent automatically. After your physician reviews your transmission, you will receive a postcard with your next transmission date.  Your physician wants you to follow-up in: 12 months with Dr Jacquiline Doe will receive a reminder letter in the mail two months in advance. If you don't receive a letter, please call our office to schedule the follow-up appointment.  Your physician has recommended you make the following change in your medication:  1) Increase Metoprolol to 100mg  in the morning and 50mg  (1/2 tablet) at night

## 2011-05-20 ENCOUNTER — Ambulatory Visit (INDEPENDENT_AMBULATORY_CARE_PROVIDER_SITE_OTHER): Payer: 59 | Admitting: *Deleted

## 2011-05-20 ENCOUNTER — Encounter: Payer: Self-pay | Admitting: Internal Medicine

## 2011-05-20 DIAGNOSIS — I5022 Chronic systolic (congestive) heart failure: Secondary | ICD-10-CM

## 2011-05-20 DIAGNOSIS — I428 Other cardiomyopathies: Secondary | ICD-10-CM

## 2011-05-20 DIAGNOSIS — I4891 Unspecified atrial fibrillation: Secondary | ICD-10-CM

## 2011-05-21 LAB — REMOTE ICD DEVICE
AL IMPEDENCE ICD: 440 Ohm
DEV-0020ICD: NEGATIVE
TZON-0004SLOWVT: 25
TZON-0005SLOWVT: 6
TZON-0010SLOWVT: 40 ms
VENTRICULAR PACING ICD: 1 pct

## 2011-05-27 NOTE — Progress Notes (Signed)
Remote icd check w/icm  

## 2011-06-01 ENCOUNTER — Other Ambulatory Visit: Payer: Self-pay

## 2011-06-01 DIAGNOSIS — I5022 Chronic systolic (congestive) heart failure: Secondary | ICD-10-CM

## 2011-06-01 DIAGNOSIS — I428 Other cardiomyopathies: Secondary | ICD-10-CM

## 2011-06-01 DIAGNOSIS — I10 Essential (primary) hypertension: Secondary | ICD-10-CM

## 2011-06-01 DIAGNOSIS — I4891 Unspecified atrial fibrillation: Secondary | ICD-10-CM

## 2011-06-01 DIAGNOSIS — E669 Obesity, unspecified: Secondary | ICD-10-CM

## 2011-06-01 MED ORDER — RIVAROXABAN 20 MG PO TABS: 20.0000 mg | ORAL_TABLET | Freq: Every day | ORAL | Status: DC

## 2011-06-01 NOTE — Telephone Encounter (Signed)
..   Requested Prescriptions   Signed Prescriptions Disp Refills  . Rivaroxaban (XARELTO) 20 MG TABS 90 tablet 3    Sig: Take 20 mg by mouth daily.    Authorizing Provider: Hillis Range    Ordering User: Lacie Scotts

## 2011-06-11 ENCOUNTER — Encounter: Payer: Self-pay | Admitting: *Deleted

## 2011-06-18 ENCOUNTER — Emergency Department (HOSPITAL_COMMUNITY)
Admission: EM | Admit: 2011-06-18 | Discharge: 2011-06-19 | Disposition: A | Discharge status: Home / Self Care | Payer: 59 | Attending: Emergency Medicine | Admitting: Emergency Medicine

## 2011-06-18 ENCOUNTER — Encounter (HOSPITAL_COMMUNITY): Payer: Self-pay | Admitting: *Deleted

## 2011-06-18 DIAGNOSIS — G4733 Obstructive sleep apnea (adult) (pediatric): Secondary | ICD-10-CM | POA: Insufficient documentation

## 2011-06-18 DIAGNOSIS — I2510 Atherosclerotic heart disease of native coronary artery without angina pectoris: Secondary | ICD-10-CM | POA: Insufficient documentation

## 2011-06-18 DIAGNOSIS — F3289 Other specified depressive episodes: Secondary | ICD-10-CM | POA: Insufficient documentation

## 2011-06-18 DIAGNOSIS — R070 Pain in throat: Secondary | ICD-10-CM | POA: Insufficient documentation

## 2011-06-18 DIAGNOSIS — Z794 Long term (current) use of insulin: Secondary | ICD-10-CM | POA: Insufficient documentation

## 2011-06-18 DIAGNOSIS — E119 Type 2 diabetes mellitus without complications: Secondary | ICD-10-CM | POA: Insufficient documentation

## 2011-06-18 DIAGNOSIS — J189 Pneumonia, unspecified organism: Secondary | ICD-10-CM | POA: Insufficient documentation

## 2011-06-18 DIAGNOSIS — I4891 Unspecified atrial fibrillation: Secondary | ICD-10-CM | POA: Insufficient documentation

## 2011-06-18 DIAGNOSIS — Z79899 Other long term (current) drug therapy: Secondary | ICD-10-CM | POA: Insufficient documentation

## 2011-06-18 DIAGNOSIS — R059 Cough, unspecified: Secondary | ICD-10-CM | POA: Insufficient documentation

## 2011-06-18 DIAGNOSIS — R0602 Shortness of breath: Secondary | ICD-10-CM | POA: Insufficient documentation

## 2011-06-18 DIAGNOSIS — G8929 Other chronic pain: Secondary | ICD-10-CM | POA: Insufficient documentation

## 2011-06-18 DIAGNOSIS — F329 Major depressive disorder, single episode, unspecified: Secondary | ICD-10-CM | POA: Insufficient documentation

## 2011-06-18 DIAGNOSIS — M549 Dorsalgia, unspecified: Secondary | ICD-10-CM | POA: Insufficient documentation

## 2011-06-18 DIAGNOSIS — R509 Fever, unspecified: Secondary | ICD-10-CM | POA: Insufficient documentation

## 2011-06-18 DIAGNOSIS — R51 Headache: Secondary | ICD-10-CM | POA: Insufficient documentation

## 2011-06-18 DIAGNOSIS — R05 Cough: Secondary | ICD-10-CM | POA: Insufficient documentation

## 2011-06-18 DIAGNOSIS — R5381 Other malaise: Secondary | ICD-10-CM | POA: Insufficient documentation

## 2011-06-18 DIAGNOSIS — Z8673 Personal history of transient ischemic attack (TIA), and cerebral infarction without residual deficits: Secondary | ICD-10-CM | POA: Insufficient documentation

## 2011-06-18 LAB — GLUCOSE, CAPILLARY: Glucose-Capillary: 115 mg/dL — ABNORMAL HIGH (ref 70–99)

## 2011-06-18 LAB — CBC
HCT: 54.5 % — ABNORMAL HIGH (ref 39.0–52.0)
MCV: 91.6 fL (ref 78.0–100.0)
RDW: 15.7 % — ABNORMAL HIGH (ref 11.5–15.5)
WBC: 12.3 10*3/uL — ABNORMAL HIGH (ref 4.0–10.5)

## 2011-06-18 LAB — DIFFERENTIAL
Basophils Absolute: 0 10*3/uL (ref 0.0–0.1)
Eosinophils Relative: 1 % (ref 0–5)
Lymphocytes Relative: 8 % — ABNORMAL LOW (ref 12–46)
Lymphs Abs: 0.9 10*3/uL (ref 0.7–4.0)
Monocytes Absolute: 1.5 10*3/uL — ABNORMAL HIGH (ref 0.1–1.0)

## 2011-06-18 MED ORDER — OXYCODONE-ACETAMINOPHEN 5-325 MG PO TABS
2.0000 | ORAL_TABLET | Freq: Once | ORAL | Status: AC
  Administered 2011-06-18: 2 via ORAL
  Filled 2011-06-18: qty 2

## 2011-06-18 NOTE — ED Notes (Signed)
Pt c/o back pain, n/v. "pressure" in shoulders, states is diabetic and has not eaten today.

## 2011-06-18 NOTE — ED Notes (Signed)
Pt unable to use the restroom at this time.  

## 2011-06-19 ENCOUNTER — Emergency Department (HOSPITAL_COMMUNITY): Payer: 59

## 2011-06-19 LAB — BASIC METABOLIC PANEL
CO2: 25 mEq/L (ref 19–32)
Chloride: 94 mEq/L — ABNORMAL LOW (ref 96–112)
Creatinine, Ser: 1.41 mg/dL — ABNORMAL HIGH (ref 0.50–1.35)
Glucose, Bld: 149 mg/dL — ABNORMAL HIGH (ref 70–99)

## 2011-06-19 LAB — URINALYSIS, ROUTINE W REFLEX MICROSCOPIC
Glucose, UA: NEGATIVE mg/dL
Ketones, ur: 15 mg/dL — AB
pH: 6 (ref 5.0–8.0)

## 2011-06-19 LAB — URINE MICROSCOPIC-ADD ON

## 2011-06-19 MED ORDER — BENZONATATE 100 MG PO CAPS: 100.0000 mg | ORAL_CAPSULE | Freq: Three times a day (TID) | ORAL | Status: AC | PRN

## 2011-06-19 MED ORDER — MOXIFLOXACIN HCL 400 MG PO TABS
400.0000 mg | ORAL_TABLET | Freq: Once | ORAL | Status: AC
  Administered 2011-06-19: 400 mg via ORAL
  Filled 2011-06-19: qty 1

## 2011-06-19 MED ORDER — MOXIFLOXACIN HCL 400 MG PO TABS: 400.0000 mg | ORAL_TABLET | Freq: Every day | ORAL | Status: AC

## 2011-06-19 NOTE — ED Provider Notes (Signed)
History     CSN: [id number]  Arrival date & time 06/18/11  2044   First MD Initiated Contact with Patient 06/18/11 2258      Chief Complaint  Patient presents with  . Headache    (Consider location/radiation/quality/duration/timing/severity/associated sxs/prior treatment) HPI T61-year-old male presents to emergency department with complaint of 2 days of pressure and tightness across his back along with cough and congestion. Patient has had subjective fevers. Patient reports overall feeling tired. He has not eaten today due to sleeping most of the day. He denies any sick contacts. Patient reports he's had streaks of blood in his cough today, reports sore throat both secondary to coughing and with swallowing. Patient reports he has chronic back pain, has not taken his normal pain medicines do to being asleep today. Patient denies any anterior chest pain. He has been short of breath at times but not currently. He denies any leg swelling history of blood clots. Patient has a history of intermittent A. fib and is on anticoagulants for this. Past Medical History  Diagnosis Date  . Cardiomyopathy     nonischemic (EF 25%)  . CAD (coronary artery disease)     nonobstructive CAD by cath 2009  . Obesity   . CVA (cerebral vascular accident)     CVA 2007 without residual deficit  . CRI (chronic renal insufficiency)     CRI (baseline creatinine 1.6)  . 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 Depression   . Erectile dysfunction   . DDD (degenerative disc disease)   . OSA (obstructive sleep apnea)   . Monoclonal gammopathy   . Atrial fibrillation 06/04/10    paroxysmal  . Diabetes mellitus     Past Surgical History  Procedure Date  . Gamma knife surgery for pituitary tumor   . Tonsillectomy   . Back surgery   . Cardiac defibrillator placement 02/19/10    by Fawn Kirk  . Cardiac defibrillator  placement     Family History  Problem Relation Age of Onset  . Lung cancer      History  Substance Use Topics  . Smoking status: Former Smoker    Types: Cigars  . Smokeless tobacco: Not on file   Comment: 25 yrs ago  . Alcohol Use: No      Review of Systems  All other systems reviewed and are negative.    Allergies  Review of patient's allergies indicates no known allergies.  Home Medications   Current Outpatient Rx  Name Route Sig Dispense Refill  . AMLODIPINE-ATORVASTATIN 5-40 MG PO TABS Oral Take 1 tablet by mouth 4 (four) times daily.     . DULOXETINE HCL 30 MG PO CPEP Oral Take 30 mg by mouth 4 (four) times daily.     . FUROSEMIDE 80 MG PO TABS Oral Take 80 mg by mouth daily.     . INSULIN ASPART 100 UNIT/ML [place] SOLN Subcutaneous Inject 83 Units into the skin daily.     . INSULIN DETEMIR 100 UNIT/ML [place] SOLN Subcutaneous Inject 38 Units into the skin 3 (three) times daily. Per sliding scale    . METOPROLOL SUCCINATE ER 100 MG PO TB24 Oral Take 100 mg by mouth daily.     . ADULT MULTIVITAMIN W/MINERALS CH Oral Take 1 tablet by mouth daily.    . OXYCODONE-ACETAMINOPHEN 10-325 MG PO TABS Oral Take 1 tablet by mouth every 4 (four)  hours as needed. For pain    . RIVAROXABAN 20 MG PO TABS Oral Take 20 mg by mouth daily.    [name] SPIRONOLACTONE 25 MG PO TABS Oral Take 25 mg by mouth daily.      [name] VALSARTAN 160 MG PO TABS Oral Take 160 mg by mouth 2 (two) times daily.     [name] ZOLPIDEM TARTRATE 10 MG PO TABS Oral Take 10 mg by mouth at bedtime as needed. For sleep      BP 144/98  Pulse 87  Temp(Src) 99.1 F (37.3 C) (Oral)  Resp 16  SpO2 93%  Physical Exam  Nursing note and vitals reviewed. Constitutional: He is oriented to person, place, and time. He appears well-developed and well-nourished.       Fatigued appearing  HENT:  Head: Normocephalic and atraumatic.  Nose: Nose normal.  Mouth/Throat: Oropharynx is clear and moist.  Neck: Normal range of motion. Neck supple.  No JVD present. No tracheal deviation present. No thyromegaly present.  Cardiovascular: Normal rate, regular rhythm, normal heart sounds and intact distal pulses.  Exam reveals no gallop and no friction rub.   No murmur heard. Pulmonary/Chest: Effort normal and breath sounds normal. No stridor. No respiratory distress. He has no wheezes. He has no rales. He exhibits no tenderness.       Coughing noted  Abdominal: Soft. Bowel sounds are normal. He exhibits no distension and no mass. There is no tenderness. There is no rebound and no guarding.  Musculoskeletal: Normal range of motion. He exhibits no edema and no tenderness.  Lymphadenopathy:    He has no cervical adenopathy.  Neurological: He is alert and oriented to person, place, and time. He exhibits normal muscle tone. Coordination normal.  Skin: Skin is warm and dry. No rash noted. No erythema. No pallor.  Psychiatric: He has a normal mood and affect. His behavior is normal. Judgment and thought content normal.    ED Course  Procedures (including critical care time)  Labs Reviewed  GLUCOSE, CAPILLARY - Abnormal; Notable for the following:    Glucose-Capillary 115 (*)    All other components within normal limits  BASIC METABOLIC PANEL - Abnormal; Notable for the following:    Sodium 132 (*)    Chloride 94 (*)    Glucose, Bld 149 (*)    Creatinine, Ser 1.41 (*)    GFR calc non Af Amer 53 (*)    GFR calc Af Amer 62 (*)    All other components within normal limits  CBC - Abnormal; Notable for the following:    WBC 12.3 (*)    RBC 5.95 (*)    Hemoglobin 19.3 (*)    HCT 54.5 (*)    RDW 15.7 (*)    All other components within normal limits  DIFFERENTIAL - Abnormal; Notable for the following:    Neutrophils Relative 79 (*)    Neutro Abs 9.7 (*)    Lymphocytes Relative 8 (*)    Monocytes Relative 13 (*)    Monocytes Absolute 1.5 (*)    All other components within normal limits  URINALYSIS, ROUTINE W REFLEX MICROSCOPIC - Abnormal;  Notable for the following:    Color, Urine AMBER (*) BIOCHEMICALS MAY BE AFFECTED BY COLOR   APPearance CLOUDY (*)    Hgb urine dipstick SMALL (*)    Bilirubin Urine SMALL (*)    Ketones, ur 15 (*)    Protein, ur >300 (*)    Leukocytes, UA TRACE (*)  All other components within normal limits  LACTIC ACID, PLASMA  URINE MICROSCOPIC-ADD ON   Dg Chest 2 View  06/19/2011  *RADIOLOGY REPORT*  Clinical Data: Productive cough with hemoptysis.  CHEST - 2 VIEW  Comparison: 02/20/2010  Findings: Two lead cardiac pacemaker without change in position. Borderline heart size with normal pulmonary vascularity.  Interval development of focal infiltration in the left mid lung with linear collection that could represent infiltration or fluid in the fissure.  Pneumonia is suggested. The given history of hemorrhage, focal alveolar hemorrhage is not excluded.  No blunting of costophrenic angles.  No pneumothorax.  IMPRESSION: Interval development of focal airspace disease in the left mid lung most suggestive of pneumonia.  Original Report Authenticated By: Marlon Pel, M.D.    Date: 06/18/2011  Rate: 88  Rhythm: sinus arrhythmia withpvc, 1st degree block  QRS Axis: left  Intervals: PR prolonged  ST/T Wave abnormalities: normal  Conduction Disutrbances:first-degree A-V block   Narrative Interpretation:   Old EKG Reviewed: unchanged    1. Community acquired pneumonia       MDM  61 year old male with back pain cough subjective fevers concern for pneumonia. We'll check lab work to include lactate. Will need chest x-ray. EKG without significant changes.  1:17 AM Chest x-ray consistent with pneumonia. We'll treat with Avelox and follow up with primary care doctor on Monday.        Olivia Mackie, MD 06/19/11 [phone]

## 2011-06-19 NOTE — Discharge Instructions (Signed)
Take antibiotic as prescribed. Followup with your doctor on Monday for recheck. Return to the emergency department for worsening condition or new concerning symptoms  Pneumonia, Adult Pneumonia is an infection of the lungs.  CAUSES Pneumonia may be caused by bacteria or a virus. Usually, these infections are caused by breathing infectious particles into the lungs (respiratory tract). SYMPTOMS   Cough.   Fever.   Chest pain.   Increased rate of breathing.   Wheezing.   Mucus production.  DIAGNOSIS  If you have the common symptoms of pneumonia, your caregiver will typically confirm the diagnosis with a chest X-ray. The X-ray will show an abnormality in the lung (pulmonary infiltrate) if you have pneumonia. Other tests of your blood, urine, or sputum may be done to find the specific cause of your pneumonia. Your caregiver may also do tests (blood gases or pulse oximetry) to see how well your lungs are working. TREATMENT  Some forms of pneumonia may be spread to other people when you cough or sneeze. You may be asked to wear a mask before and during your exam. Pneumonia that is caused by bacteria is treated with antibiotic medicine. Pneumonia that is caused by the influenza virus may be treated with an antiviral medicine. Most other viral infections must run their course. These infections will not respond to antibiotics.  PREVENTION A pneumococcal shot (vaccine) is available to prevent a common bacterial cause of pneumonia. This is usually suggested for:  People over 23 years old.   Patients on chemotherapy.   People with chronic lung problems, such as bronchitis or emphysema.   People with immune system problems.  If you are over 65 or have a high risk condition, you may receive the pneumococcal vaccine if you have not received it before. In some countries, a routine influenza vaccine is also recommended. This vaccine can help prevent some cases of pneumonia.You may be offered the  influenza vaccine as part of your care. If you smoke, it is time to quit. You may receive instructions on how to stop smoking. Your caregiver can provide medicines and counseling to help you quit. HOME CARE INSTRUCTIONS   Cough suppressants may be used if you are losing too much rest. However, coughing protects you by clearing your lungs. You should avoid using cough suppressants if you can.   Your caregiver may have prescribed medicine if he or she thinks your pneumonia is caused by a bacteria or influenza. Finish your medicine even if you start to feel better.   Your caregiver may also prescribe an expectorant. This loosens the mucus to be coughed up.   Only take over-the-counter or prescription medicines for pain, discomfort, or fever as directed by your caregiver.   Do not smoke. Smoking is a common cause of bronchitis and can contribute to pneumonia. If you are a smoker and continue to smoke, your cough may last several weeks after your pneumonia has cleared.   A cold steam vaporizer or humidifier in your room or home may help loosen mucus.   Coughing is often worse at night. Sleeping in a semi-upright position in a recliner or using a couple pillows under your head will help with this.   Get rest as you feel it is needed. Your body will usually let you know when you need to rest.  SEEK IMMEDIATE MEDICAL CARE IF:   Your illness becomes worse. This is especially true if you are elderly or weakened from any other disease.   You cannot control  your cough with suppressants and are losing sleep.   You begin coughing up blood.   You develop pain which is getting worse or is uncontrolled with medicines.   You have a fever.   Any of the symptoms which initially brought you in for treatment are getting worse rather than better.   You develop shortness of breath or chest pain.  MAKE SURE YOU:   Understand these instructions.   Will watch your condition.   Will get help right away  if you are not doing well or get worse.  Document Released: 01/25/2005 Document Revised: 01/14/2011 Document Reviewed: 04/16/2010 [facility] Patient Information 2012 [place], [place].

## 2011-08-26 ENCOUNTER — Ambulatory Visit (INDEPENDENT_AMBULATORY_CARE_PROVIDER_SITE_OTHER): Payer: 59 | Admitting: *Deleted

## 2011-08-26 DIAGNOSIS — I428 Other cardiomyopathies: Secondary | ICD-10-CM

## 2011-08-27 ENCOUNTER — Encounter: Payer: Self-pay | Admitting: Internal Medicine

## 2011-08-27 LAB — REMOTE ICD DEVICE
AL AMPLITUDE: 4.4 mv
ATRIAL PACING ICD: 37 pct
DEVICE MODEL ICD: 622169
HV IMPEDENCE: 81 Ohm
RV LEAD IMPEDENCE ICD: 450 Ohm
TZON-0004SLOWVT: 25
TZON-0010SLOWVT: 40 ms

## 2011-09-03 ENCOUNTER — Other Ambulatory Visit: Payer: Self-pay | Admitting: Orthopedic Surgery

## 2011-09-03 DIAGNOSIS — M4800 Spinal stenosis, site unspecified: Secondary | ICD-10-CM

## 2011-09-06 ENCOUNTER — Other Ambulatory Visit: Payer: Self-pay | Admitting: Orthopedic Surgery

## 2011-09-06 ENCOUNTER — Encounter: Payer: Self-pay | Admitting: *Deleted

## 2011-09-06 DIAGNOSIS — M4800 Spinal stenosis, site unspecified: Secondary | ICD-10-CM

## 2011-09-06 DIAGNOSIS — M5030 Other cervical disc degeneration, unspecified cervical region: Secondary | ICD-10-CM

## 2011-09-13 ENCOUNTER — Ambulatory Visit
Admission: RE | Admit: 2011-09-13 | Discharge: 2011-09-13 | Disposition: A | Discharge status: Home / Self Care | Payer: 59 | Source: Ambulatory Visit | Attending: Orthopedic Surgery | Admitting: Orthopedic Surgery

## 2011-09-13 VITALS — BP 112/70 | HR 74

## 2011-09-13 DIAGNOSIS — M4800 Spinal stenosis, site unspecified: Secondary | ICD-10-CM

## 2011-09-13 DIAGNOSIS — M5030 Other cervical disc degeneration, unspecified cervical region: Secondary | ICD-10-CM

## 2011-09-13 MED ORDER — DIAZEPAM 5 MG PO TABS
10.0000 mg | ORAL_TABLET | Freq: Once | ORAL | Status: AC
  Administered 2011-09-13: 10 mg via ORAL

## 2011-09-13 MED ORDER — MORPHINE SULFATE 4 MG/ML IJ SOLN
6.0000 mg | Freq: Once | INTRAMUSCULAR | Status: AC
  Administered 2011-09-13: 6 mg via INTRAMUSCULAR

## 2011-09-13 MED ORDER — HYDROXYZINE HCL 50 MG/ML IM SOLN
25.0000 mg | Freq: Once | INTRAMUSCULAR | Status: AC
  Administered 2011-09-13: 25 mg via INTRAMUSCULAR

## 2011-09-13 MED ORDER — IOHEXOL 300 MG/ML  SOLN: 10.0000 mL | Freq: Once | INTRAMUSCULAR | Status: DC | PRN

## 2011-09-13 NOTE — Progress Notes (Signed)
Patient states he has been off Xarelto for one day and Cymbalta for the past two days.  Discharge instructions explained to patient.  Donell Sievert, RN

## 2011-10-13 ENCOUNTER — Telehealth: Payer: Self-pay | Admitting: Internal Medicine

## 2011-10-13 NOTE — Telephone Encounter (Signed)
Walk In pt Form " Pt Needs someone to call [facility]" Sent to [facility] 10/13/11/KM

## 2011-10-21 ENCOUNTER — Telehealth: Payer: Self-pay | Admitting: Internal Medicine

## 2011-10-21 NOTE — Telephone Encounter (Signed)
Pt is awaiting a call back from [place] regarding his ability to get a steroid injection in his back.

## 2011-10-21 NOTE — Telephone Encounter (Signed)
New Problem:    Patient called in wanting to know what the status was for clearance his pain management shot.  Please call back.

## 2011-10-25 NOTE — Telephone Encounter (Signed)
This was faxed on 10/19/11 and we have refaxed today

## 2011-11-25 ENCOUNTER — Ambulatory Visit: Payer: 59 | Attending: Physical Medicine and Rehabilitation | Admitting: Physical Therapy

## 2011-11-25 DIAGNOSIS — M2550 Pain in unspecified joint: Secondary | ICD-10-CM | POA: Insufficient documentation

## 2011-11-25 DIAGNOSIS — R293 Abnormal posture: Secondary | ICD-10-CM | POA: Insufficient documentation

## 2011-11-25 DIAGNOSIS — M2560 Stiffness of unspecified joint, not elsewhere classified: Secondary | ICD-10-CM | POA: Insufficient documentation

## 2011-11-25 DIAGNOSIS — IMO0001 Reserved for inherently not codable concepts without codable children: Secondary | ICD-10-CM | POA: Insufficient documentation

## 2011-11-29 ENCOUNTER — Encounter: Payer: Self-pay | Admitting: Internal Medicine

## 2011-11-29 ENCOUNTER — Encounter: Payer: Self-pay | Admitting: *Deleted

## 2011-11-29 ENCOUNTER — Ambulatory Visit (INDEPENDENT_AMBULATORY_CARE_PROVIDER_SITE_OTHER): Payer: 59 | Admitting: *Deleted

## 2011-11-29 DIAGNOSIS — I5022 Chronic systolic (congestive) heart failure: Secondary | ICD-10-CM

## 2011-11-29 DIAGNOSIS — Z95810 Presence of automatic (implantable) cardiac defibrillator: Secondary | ICD-10-CM | POA: Insufficient documentation

## 2011-11-29 DIAGNOSIS — I428 Other cardiomyopathies: Secondary | ICD-10-CM

## 2011-11-29 LAB — REMOTE ICD DEVICE
AL AMPLITUDE: 2.9 mv
AL IMPEDENCE ICD: 380 Ohm
ATRIAL PACING ICD: 31 pct
BAMS-0001: 170 {beats}/min
DEV-0020ICD: NEGATIVE
DEVICE MODEL ICD: 622169
TZON-0003SLOWVT: 335 ms
TZON-0004SLOWVT: 25

## 2011-12-02 ENCOUNTER — Ambulatory Visit: Payer: 59 | Admitting: Physical Therapy

## 2011-12-06 ENCOUNTER — Encounter: Payer: Self-pay | Admitting: *Deleted

## 2011-12-06 ENCOUNTER — Ambulatory Visit: Payer: 59 | Admitting: Physical Therapy

## 2011-12-08 ENCOUNTER — Ambulatory Visit: Payer: 59 | Admitting: Physical Therapy

## 2011-12-13 ENCOUNTER — Encounter: Payer: 59 | Admitting: Physical Therapy

## 2011-12-16 ENCOUNTER — Ambulatory Visit: Payer: 59 | Attending: Physical Medicine and Rehabilitation | Admitting: Physical Therapy

## 2011-12-16 DIAGNOSIS — M2550 Pain in unspecified joint: Secondary | ICD-10-CM | POA: Insufficient documentation

## 2011-12-16 DIAGNOSIS — IMO0001 Reserved for inherently not codable concepts without codable children: Secondary | ICD-10-CM | POA: Insufficient documentation

## 2011-12-16 DIAGNOSIS — R293 Abnormal posture: Secondary | ICD-10-CM | POA: Insufficient documentation

## 2011-12-16 DIAGNOSIS — M2560 Stiffness of unspecified joint, not elsewhere classified: Secondary | ICD-10-CM | POA: Insufficient documentation

## 2011-12-20 ENCOUNTER — Encounter: Payer: 59 | Admitting: Physical Therapy

## 2011-12-21 ENCOUNTER — Ambulatory Visit: Payer: 59 | Admitting: Physical Therapy

## 2011-12-23 ENCOUNTER — Ambulatory Visit: Payer: 59 | Admitting: Physical Therapy

## 2011-12-28 ENCOUNTER — Other Ambulatory Visit: Payer: Self-pay | Admitting: *Deleted

## 2011-12-28 ENCOUNTER — Ambulatory Visit: Payer: 59 | Admitting: Physical Therapy

## 2011-12-28 MED ORDER — METOPROLOL SUCCINATE ER 100 MG PO TB24: ORAL_TABLET | ORAL | Status: DC

## 2011-12-30 ENCOUNTER — Encounter: Payer: 59 | Admitting: Physical Therapy

## 2012-01-04 ENCOUNTER — Ambulatory Visit: Payer: 59 | Admitting: Physical Therapy

## 2012-02-15 ENCOUNTER — Telehealth: Payer: Self-pay | Admitting: Internal Medicine

## 2012-02-15 ENCOUNTER — Other Ambulatory Visit: Payer: Self-pay | Admitting: *Deleted

## 2012-02-15 MED ORDER — METOPROLOL SUCCINATE ER 100 MG PO TB24: ORAL_TABLET | ORAL | Status: DC

## 2012-02-15 NOTE — Telephone Encounter (Signed)
Walk In pt Form " [facility]" Dropped off by Pt sent to Sherri Customer service manager) T[facility] End[facility] Out  02/15/12/KM

## 2012-03-09 ENCOUNTER — Ambulatory Visit (INDEPENDENT_AMBULATORY_CARE_PROVIDER_SITE_OTHER): Payer: 59 | Admitting: Internal Medicine

## 2012-03-09 ENCOUNTER — Encounter: Payer: Self-pay | Admitting: Internal Medicine

## 2012-03-09 VITALS — BP 122/80 | HR 80 | Ht 70.0 in | Wt 238.1 lb

## 2012-03-09 DIAGNOSIS — I5022 Chronic systolic (congestive) heart failure: Secondary | ICD-10-CM

## 2012-03-09 DIAGNOSIS — I10 Essential (primary) hypertension: Secondary | ICD-10-CM

## 2012-03-09 DIAGNOSIS — I428 Other cardiomyopathies: Secondary | ICD-10-CM

## 2012-03-09 DIAGNOSIS — I4891 Unspecified atrial fibrillation: Secondary | ICD-10-CM

## 2012-03-09 LAB — ICD DEVICE OBSERVATION
AL IMPEDENCE ICD: 412.5 Ohm
ATRIAL PACING ICD: 37 pct
BAMS-0001: 170 {beats}/min
DEV-0020ICD: NEGATIVE
RV LEAD AMPLITUDE: 11.3 mv
RV LEAD IMPEDENCE ICD: 350 Ohm
TOT-0006: 20120112000000
TOT-0007: 1
TOT-0008: 0
TOT-0009: 1
TZON-0003SLOWVT: 335 ms
TZON-0004SLOWVT: 25
TZON-0005SLOWVT: 6
VENTRICULAR PACING ICD: 1.2 pct

## 2012-03-09 NOTE — Assessment & Plan Note (Signed)
By ICD interrogation, afib burden is 9.7% V rates are improved  Continue xarelto for stroke prevention

## 2012-03-09 NOTE — Assessment & Plan Note (Signed)
Stable No change required today  

## 2012-03-09 NOTE — Assessment & Plan Note (Signed)
Normal ICD function See Pace Art report No changes today  Merlin every 3 months Return in 1year

## 2012-03-09 NOTE — Progress Notes (Signed)
PCP: Mickie Hillier, MD Primary Cardiologist:  Dr Eldridge Dace  The patient presents today for routine electrophysiology followup.  Since last being seen in our clinic, the patient reports doing very well. He remains asymptomatic with afib (afib burden is 9.7%).  He buys/ [employer] and is really enjoying this recently.   Today, he denies symptoms of palpitations, chest pain, orthopnea, PND, lower extremity edema, dizziness, presyncope, syncope, or neurologic sequela.  His exertional SOB is stable. The patient feels that he is tolerating medications without difficulties and is otherwise without complaint today.   Past Medical History  Diagnosis Date  . Cardiomyopathy     nonischemic (EF 25%)  . CAD (coronary artery disease)     nonobstructive CAD by cath 2009  . Obesity   . CVA (cerebral vascular accident)     CVA 2007 without residual deficit  . CRI (chronic renal insufficiency)     CRI (baseline creatinine 1.6)  . 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 Depression   . Erectile dysfunction   . DDD (degenerative disc disease)   . OSA (obstructive sleep apnea)   . Monoclonal gammopathy   . Atrial fibrillation 06/04/10    paroxysmal  . Diabetes mellitus    Past Surgical History  Procedure Date  . Gamma knife surgery for pituitary tumor   . Tonsillectomy   . Back surgery   . Cardiac defibrillator placement 02/19/10    by Fawn Kirk  . Cardiac defibrillator placement     Current Outpatient Prescriptions  Medication Sig Dispense Refill  . ALPRAZolam (XANAX) 0.5 MG tablet       . amLODipine-atorvastatin (CADUET) 5-40 MG per tablet Take 1 tablet by mouth 4 (four) times daily.       . B-D ULTRAFINE III SHORT PEN 31G X 8 MM MISC       . DULoxetine (CYMBALTA) 30 MG capsule Take 30 mg by mouth 4 (four) times daily.       . furosemide (LASIX) 80 MG tablet Take 80 mg by mouth daily.       [name]  HUMALOG KWIKPEN 100 UNIT/ML injection       . insulin detemir (LEVEMIR) 100 UNIT/ML injection Inject 38 Units into the skin 3 (three) times daily. Per sliding scale      . INVOKANA 300 MG TABS       . metoprolol succinate (TOPROL-XL) 100 MG 24 hr tablet Take one tablet in the morning and one half tablet at night by mouth daily  135 tablet  3  . Multiple Vitamin (MULITIVITAMIN WITH MINERALS) TABS Take 1 tablet by mouth daily.      [name] oxyCODONE-acetaminophen (PERCOCET) 10-325 MG per tablet Take 1 tablet by mouth every 4 (four) hours as needed. For pain      . Rivaroxaban 20 MG TABS Take 20 mg by mouth daily.      [name] spironolactone (ALDACTONE) 25 MG tablet Take 25 mg by mouth daily.        . traZODone (DESYREL) 50 MG tablet       . zolpidem (AMBIEN) 10 MG tablet Take 10 mg by mouth at bedtime as needed. For sleep        No Known Allergies  History   Social History  . Marital Status: Married    Spouse Name: N/A    Number of Children: N/A  . Years of Education: N/A  Occupational History  . Not on file.   Social History Main Topics  . Smoking status: Former Smoker    Types: Cigars  . Smokeless tobacco: Not on file     Comment: 25 yrs ago  . Alcohol Use: No  . Drug Use: No  . Sexually Active: Not on file   Other Topics Concern  . Not on file   Social History Narrative  . No narrative on file    Family History  Problem Relation Age of Onset  . Lung cancer       Physical Exam: Filed Vitals:   03/09/12 1003  BP: 122/80  Pulse: 80  Height: 5\' 10"  (1.778 m)  Weight: 238 lb 1.9 oz (108.011 kg)    GEN- The patient is well appearing, alert and oriented x 3 today.   Head- normocephalic, atraumatic Eyes-  Sclera clear, conjunctiva pink Ears- hearing intact Oropharynx- clear Neck- supple, no JVP Lymph- no cervical lymphadenopathy Lungs- Clear to ausculation bilaterally, normal work of breathing Chest- ICD pocket is well healed Heart- Regular rate and rhythm, no murmurs,  rubs or gallops, PMI not laterally displaced GI- soft, NT, ND, + BS Extremities- no clubbing, cyanosis, or edema MS- no significant deformity or atrophy Skin- no rash or lesion Psych- euthymic mood, full affect Neuro- strength and sensation are intact  ICD interrogation- reviewed in detail today,  See PACEART report  Assessment and Plan:

## 2012-03-09 NOTE — Patient Instructions (Addendum)
Your physician wants you to follow-up in: 12 months with Dr Jacquiline Doe will receive a reminder letter in the mail two months in advance. If you don't receive a letter, please call our office to schedule the follow-up appointment.    Remote monitoring is used to monitor your Pacemaker of ICD from home. This monitoring reduces the number of office visits required to check your device to one time per year. It allows [place] to keep an eye on the functioning of your device to ensure it is working properly. You are scheduled for a device check from home on 06/05/2012. You may send your transmission at any time that day. If you have a wireless device, the transmission will be sent automatically. After your physician reviews your transmission, you will receive a postcard with your next transmission date.

## 2012-04-11 ENCOUNTER — Other Ambulatory Visit: Payer: Self-pay | Admitting: Emergency Medicine

## 2012-04-11 MED ORDER — RIVAROXABAN 20 MG PO TABS: 20.0000 mg | ORAL_TABLET | Freq: Every day | ORAL | Status: DC

## 2012-04-17 ENCOUNTER — Telehealth: Payer: Self-pay | Admitting: Internal Medicine

## 2012-04-17 NOTE — Telephone Encounter (Signed)
New Problem:    Patient called in because his pharmacy claims they never received his Rivaroxaban (XARELTO) 20 MG TABS refill request that was sent out on 04/11/12.

## 2012-04-17 NOTE — Telephone Encounter (Signed)
Called patient to verify Pharmacy. In chart states refill was [facility] mail [facility]. But did not get an answer.   Micki Riley, CMA

## 2012-05-10 ENCOUNTER — Telehealth: Payer: Self-pay | Admitting: Internal Medicine

## 2012-05-10 NOTE — Telephone Encounter (Signed)
Needs to go to his primary cardiologist Dr Eldridge Dace

## 2012-05-10 NOTE — Telephone Encounter (Signed)
New problem    Upcoming epidural injection need to stop xarelto 5 days. Need something in written

## 2012-06-05 ENCOUNTER — Encounter: Payer: Self-pay | Admitting: Internal Medicine

## 2012-06-05 ENCOUNTER — Other Ambulatory Visit: Payer: Self-pay | Admitting: Internal Medicine

## 2012-06-05 ENCOUNTER — Ambulatory Visit (INDEPENDENT_AMBULATORY_CARE_PROVIDER_SITE_OTHER): Payer: 59 | Admitting: *Deleted

## 2012-06-05 DIAGNOSIS — I428 Other cardiomyopathies: Secondary | ICD-10-CM

## 2012-06-05 DIAGNOSIS — Z95810 Presence of automatic (implantable) cardiac defibrillator: Secondary | ICD-10-CM

## 2012-06-05 DIAGNOSIS — I5022 Chronic systolic (congestive) heart failure: Secondary | ICD-10-CM

## 2012-06-05 LAB — REMOTE ICD DEVICE
AL IMPEDENCE ICD: 430 Ohm
HV IMPEDENCE: 87 Ohm
RV LEAD AMPLITUDE: 11.3 mv
TZON-0004SLOWVT: 25
TZON-0005SLOWVT: 6

## 2012-06-28 ENCOUNTER — Encounter: Payer: Self-pay | Admitting: *Deleted

## 2012-09-04 ENCOUNTER — Encounter: Payer: Self-pay | Admitting: Internal Medicine

## 2012-09-04 ENCOUNTER — Ambulatory Visit (INDEPENDENT_AMBULATORY_CARE_PROVIDER_SITE_OTHER): Payer: 59 | Admitting: *Deleted

## 2012-09-04 DIAGNOSIS — Z95810 Presence of automatic (implantable) cardiac defibrillator: Secondary | ICD-10-CM

## 2012-09-04 DIAGNOSIS — I5022 Chronic systolic (congestive) heart failure: Secondary | ICD-10-CM

## 2012-09-04 DIAGNOSIS — I428 Other cardiomyopathies: Secondary | ICD-10-CM

## 2012-09-04 LAB — REMOTE ICD DEVICE
AL IMPEDENCE ICD: 440 Ohm
RV LEAD AMPLITUDE: 11.3 mv
RV LEAD IMPEDENCE ICD: 380 Ohm
TZON-0005SLOWVT: 6

## 2012-12-04 ENCOUNTER — Ambulatory Visit (INDEPENDENT_AMBULATORY_CARE_PROVIDER_SITE_OTHER): Payer: 59 | Admitting: *Deleted

## 2012-12-04 DIAGNOSIS — I5022 Chronic systolic (congestive) heart failure: Secondary | ICD-10-CM

## 2012-12-04 DIAGNOSIS — I4891 Unspecified atrial fibrillation: Secondary | ICD-10-CM

## 2012-12-04 DIAGNOSIS — I428 Other cardiomyopathies: Secondary | ICD-10-CM

## 2012-12-11 ENCOUNTER — Ambulatory Visit (INDEPENDENT_AMBULATORY_CARE_PROVIDER_SITE_OTHER): Payer: 59 | Admitting: *Deleted

## 2012-12-11 ENCOUNTER — Encounter: Payer: Self-pay | Admitting: Internal Medicine

## 2012-12-11 DIAGNOSIS — I4891 Unspecified atrial fibrillation: Secondary | ICD-10-CM

## 2012-12-11 DIAGNOSIS — I428 Other cardiomyopathies: Secondary | ICD-10-CM

## 2012-12-11 DIAGNOSIS — I5022 Chronic systolic (congestive) heart failure: Secondary | ICD-10-CM

## 2012-12-11 LAB — REMOTE ICD DEVICE
AL AMPLITUDE: 3 mv
ATRIAL PACING ICD: 59 pct
DEVICE MODEL ICD: 622169
HV IMPEDENCE: 64 Ohm
RV LEAD IMPEDENCE ICD: 330 Ohm
TZON-0010SLOWVT: 40 ms

## 2012-12-12 ENCOUNTER — Telehealth: Payer: Self-pay

## 2012-12-12 NOTE — Telephone Encounter (Signed)
New problem     Need a letter stating patient to come off xarelto for epidural steroid injection-  Lumbar .

## 2012-12-12 NOTE — Telephone Encounter (Signed)
To Dr. Varanasi. 

## 2012-12-14 NOTE — Progress Notes (Signed)
Remote defib received  

## 2012-12-15 NOTE — Telephone Encounter (Signed)
F/u    Calling about previous message. Please advise

## 2012-12-15 NOTE — Telephone Encounter (Signed)
Spoke to Vivian and let her know this has been sent to Dr Eldridge Dace .  This has not been scheduled yet

## 2012-12-18 ENCOUNTER — Encounter: Payer: Self-pay | Admitting: *Deleted

## 2012-12-18 NOTE — Telephone Encounter (Signed)
Ok to stop Xarelto 3 days prior to injection.

## 2012-12-19 NOTE — Telephone Encounter (Signed)
[facility] Manon [facility]

## 2013-01-01 ENCOUNTER — Telehealth: Payer: Self-pay | Admitting: *Deleted

## 2013-01-01 NOTE — Telephone Encounter (Signed)
[facility] xarelto tablet for 1 year, PA # [id number]

## 2013-02-14 ENCOUNTER — Encounter: Payer: Self-pay | Admitting: Internal Medicine

## 2013-02-15 ENCOUNTER — Encounter: Payer: 59 | Admitting: Internal Medicine

## 2013-02-15 ENCOUNTER — Encounter: Payer: Self-pay | Admitting: Internal Medicine

## 2013-02-15 ENCOUNTER — Ambulatory Visit (INDEPENDENT_AMBULATORY_CARE_PROVIDER_SITE_OTHER): Payer: 59 | Admitting: Internal Medicine

## 2013-02-15 VITALS — BP 119/72 | HR 64 | Ht 70.0 in | Wt 247.4 lb

## 2013-02-15 DIAGNOSIS — I4891 Unspecified atrial fibrillation: Secondary | ICD-10-CM

## 2013-02-15 DIAGNOSIS — Z95810 Presence of automatic (implantable) cardiac defibrillator: Secondary | ICD-10-CM

## 2013-02-15 DIAGNOSIS — I10 Essential (primary) hypertension: Secondary | ICD-10-CM

## 2013-02-15 DIAGNOSIS — I5022 Chronic systolic (congestive) heart failure: Secondary | ICD-10-CM

## 2013-02-15 NOTE — Progress Notes (Signed)
PCP: [name], MD Primary Cardiologist: Manual Navarra is a 63 y.o. male who presents today for routine electrophysiology followup.  Since last being seen in our clinic, the patient reports doing very well.  He did choke on a piece of steak about 2 weeks ago and received the Heimleich maneuver from a friend.  He remains asymptomatic with his atrial fibrillation (6.5%).  He does state that his energy level has decreased recently.  He is A pacing 60% of the time and rate response was turned on today.   Today, he denies symptoms of palpitations, chest pain,  lower extremity edema, dizziness, presyncope, syncope, or ICD shocks.  The patient is otherwise without complaint today.   Past Medical History  Diagnosis Date  . Cardiomyopathy     nonischemic (EF 25%)  . CAD (coronary artery disease)     nonobstructive CAD by cath 2009  . Obesity   . CVA (cerebral vascular accident)     CVA 2007 without residual deficit  . 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 Depression   . Erectile dysfunction   . DDD (degenerative disc disease)   . OSA (obstructive sleep apnea)   . Monoclonal gammopathy   . CRI (chronic renal insufficiency)     CRI (baseline creatinine 1.6)  . CKD (chronic kidney disease), stage III     moderate  . 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 Hypogonadotropic hypogonadism   . Subclinical hypothyroidism   . Polycythemia, secondary     improved/resolved  . Hypercholesterolemia   . Back pain     F/B [name]  . Monoclonal gammopathy of unknown significance     per [name]  . 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 Neck pain     F/B [name]  . PAF (paroxysmal atrial fibrillation) 06/04/10    Hx of, has device  . History of diverticulitis of colon   . Type II or unspecified type diabetes mellitus with  neurological manifestations, uncontrolled(250.62) 2000  . 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 Gamma knife surgery for pituitary tumor    . Tonsillectomy    . Back surgery    . Cardiac defibrillator placement  02/19/10    By JA.     Current Outpatient Prescriptions  Medication Sig Dispense Refill  . ALPRAZolam (XANAX) 0.5 MG tablet Take 0.5 mg by mouth 3 (three) times daily as needed.       [name] amLODipine-atorvastatin (CADUET) 5-40 MG per tablet Take 1 tablet by mouth daily.       . DULoxetine (CYMBALTA) 30 MG capsule Take 30 mg by mouth 3 (three) times daily.       . furosemide (LASIX) 80 MG tablet Take 80 mg by mouth 2 (two) times daily.       . insulin lispro protamine-lispro (HUMALOG 50/50 MIX) (50-50) 100 UNIT/ML SUSP injection Inject 60 Units into the skin 3 (three) times daily before meals. Pt only taking once a day most days      . INVOKANA 300 MG TABS Take 1 tablet by mouth every morning.       . metoprolol succinate (TOPROL-XL) 100 MG 24 hr tablet Take one tablet in the morning and one half tablet at night by mouth daily  135 tablet  3  . Multiple Vitamin (MULITIVITAMIN WITH MINERALS) TABS Take 1 tablet by mouth daily.      [name] oxyCODONE-acetaminophen (PERCOCET) 10-325 MG per tablet Take 1 tablet by mouth every 4 (four) hours as needed. For pain      . Rivaroxaban (XARELTO) 20 MG TABS Take 1 tablet (20 mg total) by mouth daily.  30 tablet  5  . spironolactone (ALDACTONE) 25 MG tablet Take 25 mg by mouth every other day.       . traZODone (DESYREL) 50 MG tablet Take 50 mg by mouth at bedtime.       . valsartan (DIOVAN) 160 MG tablet Take 160 mg by mouth daily.      [name] zolpidem (AMBIEN) 10 MG tablet Take 10 mg by mouth at bedtime as needed. For sleep       No current facility-administered medications for this visit.    Physical Exam: Filed Vitals:   02/15/13 1225  BP: 119/72  Pulse: 64  Height: 5\' 10"  (1.778 m)   Weight: 247 lb 6.4 oz (112.22 kg)    GEN- The patient is well appearing, alert and oriented x 3 today.   Head- normocephalic, atraumatic Eyes-  Sclera clear, conjunctiva pink Ears- hearing intact Oropharynx- clear Lungs- Clear to ausculation bilaterally, normal work of breathing Chest- ICD pocket is well healed Heart- Regular rate and rhythm, no murmurs, rubs or gallops, PMI not laterally displaced GI- soft, NT, ND, + BS Extremities- no clubbing, cyanosis, or edema  ICD interrogation- reviewed in detail today,  See PACEART report  Assessment and Plan:  1.  Chronic systolic dysfunction euvolemic today Stable on an appropriate medical regimen Normal ICD function See Pace Art report No changes today  2.  Hypertension Stable No change required today  3.  Atrial fibrillation Continue xarelto for stroke prevention (CHADS2VASC score is at least 5) Should have CBC and BMET obtained by PCP at least twice per year.  Merlin Return in 1 year

## 2013-02-15 NOTE — Patient Instructions (Signed)
Remote monitoring is used to monitor your Pacemaker of ICD from home. This monitoring reduces the number of office visits required to check your device to one time per year. It allows [place] to keep an eye on the functioning of your device to ensure it is working properly. You are scheduled for a device check from home on March 19, 2013 (heart failure nurse transmission). You may send your transmission at any time that day. If you have a wireless device, the transmission will be sent automatically. After your physician reviews your transmission, you will receive a postcard with your next transmission date.  Your physician wants you to follow-up in: 1 year with Dr Rayann Heman. You will receive a reminder letter in the mail two months in advance. If you don't receive a letter, please call our office to schedule the follow-up appointment.

## 2013-03-19 ENCOUNTER — Ambulatory Visit (INDEPENDENT_AMBULATORY_CARE_PROVIDER_SITE_OTHER): Payer: 59 | Admitting: *Deleted

## 2013-03-19 DIAGNOSIS — I5022 Chronic systolic (congestive) heart failure: Secondary | ICD-10-CM

## 2013-03-19 DIAGNOSIS — Z95810 Presence of automatic (implantable) cardiac defibrillator: Secondary | ICD-10-CM

## 2013-03-19 NOTE — Progress Notes (Signed)
EPIC Encounter for ICM Monitoring  Patient Name: Larry [name] is a 63 y.o. male Date: 03/19/2013 Primary Care Physican: [name], MD Primary Cardiologist: Irish Lack Electrophysiologist: Allred Dry Weight: 244 lbs       In the past month, have you:  1. Gained more than 2 pounds in a day or more than 5 pounds in a week? Possibly. The patient does not weigh daily, but reports that his weights were around 228 lbs in November. He admits to some dietary indiscretion over the holidays, but has noticed that since the beginning of the year, his weights are steadily increasing. He reports edema to his lower extremities during the day, but props them on pillows at night and this seems to resolve for him. He does feel that his clothes are fitting tighter, but feels he is maintaining an appropriate diet. His corvue levels were up for about 14 days from mid to late January to early February. He is currently taking furosemide 80 mg BID and spironolactone 25 mg one tablet every other day.   2. Had changes in your medications (with verification of current medications)? no  3. Had more shortness of breath than is usual for you? Yes, progressively worse with activity.   4. Limited your activity because of shortness of breath? no  5. Not been able to sleep because of shortness of breath? no  6. Had increased swelling in your feet or ankles? no  7. Had symptoms of dehydration (dizziness, dry mouth, increased thirst, decreased urine output) no  8. Had changes in sodium restriction? no  9. Been compliant with medication? Yes   ICM trend:   Follow-up plan: ICM clinic phone appointment: 04/19/13  ( Will forward to Dr. Rayann Heman and Dr. Irish Lack for review of patient symptoms and recommendations).   Copy of note sent to patient's primary care physician, primary cardiologist, and device following physician.  Alvis Lemmings, RN, BSN 03/19/2013 12:06 PM

## 2013-03-23 ENCOUNTER — Other Ambulatory Visit: Payer: Self-pay | Admitting: Internal Medicine

## 2013-03-28 NOTE — Progress Notes (Signed)
Is he reporting SHOB?

## 2013-03-29 NOTE — Progress Notes (Signed)
lmtrc

## 2013-03-30 NOTE — Progress Notes (Signed)
Spoke with pt and he is feeling pretty good. He states he has gained 20 lbs since October. Pt has SOB with exertion, but he is fine with short walks. Pt has swelling during the day and it will go down throughout the night when he sleeps. Pt is taking Lasix 40 mg 2 tablets daily. Pt thinks he has a follow up with [place] next month.

## 2013-03-30 NOTE — Progress Notes (Signed)
Pt has appt 04/23/13.

## 2013-04-06 NOTE — Progress Notes (Signed)
Pt notified to keep appt on 04/23/13.

## 2013-04-13 ENCOUNTER — Encounter: Payer: Self-pay | Admitting: Interventional Cardiology

## 2013-04-13 ENCOUNTER — Encounter (INDEPENDENT_AMBULATORY_CARE_PROVIDER_SITE_OTHER): Payer: Self-pay

## 2013-04-13 ENCOUNTER — Ambulatory Visit (INDEPENDENT_AMBULATORY_CARE_PROVIDER_SITE_OTHER): Payer: 59 | Admitting: Interventional Cardiology

## 2013-04-13 VITALS — BP 142/69 | HR 74 | Ht 70.0 in | Wt 253.2 lb

## 2013-04-13 DIAGNOSIS — E669 Obesity, unspecified: Secondary | ICD-10-CM

## 2013-04-13 DIAGNOSIS — R0609 Other forms of dyspnea: Secondary | ICD-10-CM

## 2013-04-13 DIAGNOSIS — R0989 Other specified symptoms and signs involving the circulatory and respiratory systems: Secondary | ICD-10-CM

## 2013-04-13 DIAGNOSIS — I10 Essential (primary) hypertension: Secondary | ICD-10-CM

## 2013-04-13 DIAGNOSIS — R0600 Dyspnea, unspecified: Secondary | ICD-10-CM

## 2013-04-13 DIAGNOSIS — I428 Other cardiomyopathies: Secondary | ICD-10-CM

## 2013-04-13 DIAGNOSIS — I4891 Unspecified atrial fibrillation: Secondary | ICD-10-CM

## 2013-04-13 LAB — BASIC METABOLIC PANEL
BUN: 25 mg/dL — AB (ref 6–23)
CO2: 28 mEq/L (ref 19–32)
CREATININE: 1.5 mg/dL (ref 0.4–1.5)
Calcium: 9.2 mg/dL (ref 8.4–10.5)
Chloride: 101 mEq/L (ref 96–112)
GFR: 51.4 mL/min — AB (ref >60.00)
GLUCOSE: 217 mg/dL — AB (ref 70–99)
POTASSIUM: 4.1 meq/L (ref 3.5–5.1)
Sodium: 136 mEq/L (ref 135–145)

## 2013-04-13 LAB — BRAIN NATRIURETIC PEPTIDE: PRO B NATRI PEPTIDE: 164 pg/mL — AB (ref 0.0–100.0)

## 2013-04-13 NOTE — Progress Notes (Signed)
Patient ID: Larry [name], male   DOB: Oct 12, 1950, 63 y.o.   MRN: [id number]    [name], [name] [name], [name]  [address] Phone: [phone] Fax:  [phone]  Date:  04/13/2013   ID:  Larry [name], DOB January 10, 1951, MRN [id number]  PCP:  [name], MD      History of Present Illness: Larry [name] is a 63 y.o. male who has a nonischemic cardiomyopathy. He has gained weight since his last visit. He has difficultly with his diabetes. He is following up with Dr. Buddy Duty. Diet is better. No bleeding problems.  AICD showed increaesd fluid in January. He has DOE with walking up steps.  He takes his meds well and follows a low salt diet. Unsure why he has gained weight.  He continues to walk regularly.  Cardiomyopathy:  c/o Leg edema more at the end of the day.  Denies : Chest pain.  Shortness of breath.  Dizziness.  Orthopnea.  Paroxysmal nocturnal dyspnea.  Syncope.  walks regularly when weather is good.    Wt Readings from Last 3 Encounters:  04/13/13 253 lb 3.2 oz (114.851 kg)  02/15/13 247 lb 6.4 oz (112.22 kg)  03/09/12 238 lb 1.9 oz (108.011 kg)     Past Medical History  Diagnosis Date  . Cardiomyopathy     nonischemic (EF 25%)  . CAD (coronary artery disease)     nonobstructive CAD by cath 2009  . Obesity   . CVA (cerebral vascular accident)     CVA 2007 without residual deficit  . 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 Depression   . Erectile dysfunction   . DDD (degenerative disc disease)   . OSA (obstructive sleep apnea)   . Monoclonal gammopathy   . CRI (chronic renal insufficiency)     CRI (baseline creatinine 1.6)  . CKD (chronic kidney disease), stage III     moderate  . 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 Hypogonadotropic hypogonadism   . Subclinical hypothyroidism   . Polycythemia, secondary     improved/resolved  .  Hypercholesterolemia   . Back pain     F/B [name]  . Monoclonal gammopathy of unknown significance     per [name]  . 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 Neck pain     F/B [name]  . PAF (paroxysmal atrial fibrillation) 06/04/10    Hx of, has device  . History of diverticulitis of colon   . Type II or unspecified type diabetes mellitus with neurological manifestations, uncontrolled 2000  . Nonproliferative diabetic retinopathy XLK(440.10)     Current Outpatient Prescriptions  Medication Sig Dispense Refill  . ALPRAZolam (XANAX) 0.5 MG tablet Take 0.5 mg by mouth 3 (three) times daily as needed.       [name] amLODipine-atorvastatin (CADUET) 5-40 MG per tablet Take 1 tablet by mouth daily.       . DULoxetine (CYMBALTA) 30 MG capsule Take 30 mg by mouth 3 (three) times daily.       . furosemide (LASIX) 80 MG tablet Take 80 mg by mouth 2 (two) times daily.       . insulin lispro (HUMALOG) 100 UNIT/ML injection Inject 30 Units into the skin daily.      . INVOKANA 300 MG TABS Take 1 tablet by mouth every  morning.       . metoprolol succinate (TOPROL-XL) 100 MG 24 hr tablet TAKE 1 TABLET IN THE MORNING AND ONE-HALF TABLET AT NIGHT DAILY  135 tablet  2  . Multiple Vitamin (MULITIVITAMIN WITH MINERALS) TABS Take 1 tablet by mouth daily.      [name] oxyCODONE-acetaminophen (PERCOCET) 10-325 MG per tablet Take 1 tablet by mouth every 4 (four) hours as needed. For pain      . Rivaroxaban (XARELTO) 20 MG TABS Take 1 tablet (20 mg total) by mouth daily.  30 tablet  5  . spironolactone (ALDACTONE) 25 MG tablet Take 25 mg by mouth every other day.       . traZODone (DESYREL) 50 MG tablet Take 50 mg by mouth at bedtime.       . valsartan (DIOVAN) 160 MG tablet Take 160 mg by mouth daily.      [name] zolpidem (AMBIEN) 10 MG tablet Take 10 mg by mouth at bedtime as needed. For sleep       No current facility-administered medications for this visit.    Allergies:      Allergies  Allergen Reactions  . Bydureon [Exenatide] Other (See Comments)    sweating  . Losartan Potassium Other (See Comments)    insomnia    Social History:  The patient  reports that he quit smoking about 30 years ago. His smoking use included Cigars. He does not have any smokeless tobacco history on file. He reports that he does not drink alcohol or use illicit drugs.   Family History:  The patient's family history includes Cancer in his mother; Dementia in his mother; Depression in his mother; Diabetes in his brother and paternal uncle; Heart disease in his brother and father; Hypertension in his brother, father, and sister; Lung cancer in his father, paternal uncle, and another family member; Obesity in his brother, father, and sister.   ROS:  Please see the history of present illness.  No nausea, vomiting.  No fevers, chills.  No focal weakness.  No dysuria. Obesity, weight gain.  All other systems reviewed and negative.   PHYSICAL EXAM: VS:  BP 142/69  Pulse 74  Ht 5\' 10"  (1.778 m)  Wt 253 lb 3.2 oz (114.851 kg)  BMI 36.33 kg/m2 Well nourished, well developed, in no acute distress HEENT: normal Neck: no JVD, no carotid bruits Cardiac:  normal S1, S2; RRR;  Lungs:  clear to auscultation bilaterally, no wheezing, rhonchi or rales Abd: soft, nontender, no hepatomegaly Ext: no edema Skin: warm and dry Neuro:   no focal abnormalities noted  EKG:  Atrial pacemaker, IVCD, poor R wave progression    ASSESSMENT AND PLAN:  Cardiomyopathy  Continue Lasix Tablet, 80 mg, 1 tab, Orally, Twice a day Continue Diovan Tablet, 160 MG, 1 tablet, Orally, Once a day Continue Spironolactone Tablet, 25 MG, 1 tablet, Orally, Once a day Continue Metoprolol Succinate Tablet Extended Release 24 Hour, 100 MG, 1 tab am and 1/2 tab in the pm, Orally, as directed Notes: AICD has been placed because of cardiomyopathy. DOE may be more from deconditioning. No orthopnea or PND. No overt heart failure.   I don't think his weight increase is from fluid.    2. Mixed hyperlipidemia  Refill Metoprolol Succinate Tablet Extended Release 24 Hour, 100 MG, 1 tab am and 1/2 tab in the pm, Orally, as directed Continue Caduet Tablet, 5-40 MG, 1 tablet, Orally, Twice a day Notes: LDL 103, TG increased at last check. COntinue caduet.  Only insulin has changed. COnsider fish oil to help to reduce TG.  LDL 103, triglycerides 294 in March 2014. Is being managed by Dr. Buddy Duty.    3. Obesity  Notes: COntinue to try to lose weight. Gained 15 lbs recently. Following diabetic diet will be beneficial for heart and weight loss. He was concerned that the recent weight gain was fluid. He denies any significant lower extremity edema. He has no orthopnea.    4. Atrial fibrillation  Notes: Xarelto for anticoagulation to decrease risk of stroke. Need to dose Xarelto based on Creatine clearance.  Check labs today.    Preventive Medicine  Adult topics discussed:  Exercise: at least 30 minutes of aerobic exercise, 5 days a week.    Follow Up     Signed, Mina Marble, MD, [facility] 04/13/2013 12:03 PM

## 2013-04-13 NOTE — Patient Instructions (Signed)
Your physician recommends that you return for lab work today for BNP and BMet.  Your physician has requested that you have an echocardiogram. Echocardiography is a painless test that uses sound waves to create images of your heart. It provides your doctor with information about the size and shape of your heart and how well your heart's chambers and valves are working. This procedure takes approximately one hour. There are no restrictions for this procedure.

## 2013-04-17 ENCOUNTER — Other Ambulatory Visit: Payer: Self-pay | Admitting: Interventional Cardiology

## 2013-04-23 ENCOUNTER — Ambulatory Visit: Payer: 59 | Admitting: Interventional Cardiology

## 2013-04-26 ENCOUNTER — Ambulatory Visit (INDEPENDENT_AMBULATORY_CARE_PROVIDER_SITE_OTHER): Payer: 59 | Admitting: *Deleted

## 2013-04-26 DIAGNOSIS — Z95810 Presence of automatic (implantable) cardiac defibrillator: Secondary | ICD-10-CM

## 2013-04-26 DIAGNOSIS — I5022 Chronic systolic (congestive) heart failure: Secondary | ICD-10-CM

## 2013-04-26 NOTE — Progress Notes (Signed)
EPIC Encounter for ICM Monitoring  Patient Name: Larry [name] is a 63 y.o. male Date: 04/26/2013 Primary Care Physican: [name], MD Primary Cardiologist: Irish Lack Electrophysiologist: Allred Dry Weight: 253 lbs       In the past month, have you:  1. Gained more than 2 pounds in a day or more than 5 pounds in a week? No. (Per the patient's report, he continues to have a steady increase in his weights. This was discussed with Dr. Irish Lack at his most recent office visit. Echo pending in 05/02/13).  2. Had changes in your medications (with verification of current medications)? no  3. Had more shortness of breath than is usual for you? No. (Does have continued SOB, but not worse).   4. Limited your activity because of shortness of breath? no  5. Not been able to sleep because of shortness of breath? no  6. Had increased swelling in your feet or ankles? no  7. Had symptoms of dehydration (dizziness, dry mouth, increased thirst, decreased urine output) no  8. Had changes in sodium restriction? no  9. Been compliant with medication? Yes   ICM trend:   Follow-up plan: ICM clinic phone appointment: 05/28/13. Echo pending- 05/02/13.  Copy of note sent to patient's primary care physician, primary cardiologist, and device following physician.  Alvis Lemmings, RN, BSN 04/26/2013 3:22 PM

## 2013-05-02 ENCOUNTER — Ambulatory Visit (HOSPITAL_COMMUNITY): Payer: 59 | Attending: Cardiology | Admitting: Cardiology

## 2013-05-02 ENCOUNTER — Other Ambulatory Visit (HOSPITAL_COMMUNITY): Payer: 59 | Admitting: *Deleted

## 2013-05-02 DIAGNOSIS — R0600 Dyspnea, unspecified: Secondary | ICD-10-CM

## 2013-05-02 DIAGNOSIS — I428 Other cardiomyopathies: Secondary | ICD-10-CM | POA: Insufficient documentation

## 2013-05-02 DIAGNOSIS — I4891 Unspecified atrial fibrillation: Secondary | ICD-10-CM | POA: Insufficient documentation

## 2013-05-02 DIAGNOSIS — R0609 Other forms of dyspnea: Secondary | ICD-10-CM | POA: Insufficient documentation

## 2013-05-02 DIAGNOSIS — I5022 Chronic systolic (congestive) heart failure: Secondary | ICD-10-CM | POA: Insufficient documentation

## 2013-05-02 DIAGNOSIS — R0989 Other specified symptoms and signs involving the circulatory and respiratory systems: Principal | ICD-10-CM | POA: Insufficient documentation

## 2013-05-02 MED ORDER — PERFLUTREN PROTEIN A MICROSPH IV SUSP
3.0000 mL | Freq: Once | INTRAVENOUS | Status: AC
  Administered 2013-05-02: 3 mL via INTRAVENOUS

## 2013-05-02 NOTE — Progress Notes (Signed)
Echo performed. 

## 2013-05-03 ENCOUNTER — Telehealth: Payer: Self-pay | Admitting: Internal Medicine

## 2013-05-03 NOTE — Telephone Encounter (Signed)
New message     Need to come off xarelto - lumbar esi .

## 2013-05-04 ENCOUNTER — Telehealth: Payer: Self-pay | Admitting: Cardiology

## 2013-05-04 DIAGNOSIS — I428 Other cardiomyopathies: Secondary | ICD-10-CM

## 2013-05-04 DIAGNOSIS — I5022 Chronic systolic (congestive) heart failure: Secondary | ICD-10-CM

## 2013-05-04 NOTE — Telephone Encounter (Signed)
Message copied by Alcario Drought on Fri May 04, 2013  3:02 PM ------      Message from: Jettie Booze      Created: Fri May 04, 2013  2:15 PM       EF still low.  Please refer to Dr. Haroldine Laws non-urgent heart failure eval. ------

## 2013-05-04 NOTE — Telephone Encounter (Signed)
Ok to come off for a short period of time. What would you recommend

## 2013-05-04 NOTE — Telephone Encounter (Signed)
Ok to follow Larry [name]'s instructions.

## 2013-05-04 NOTE — Telephone Encounter (Signed)
Referral sent 

## 2013-05-04 NOTE — Telephone Encounter (Signed)
Hold Xarelto 2 days prior to epidural injection.  Would wait a full 24 hours before restarting Xarelto.  Restart Xarelto the following day (take a night with food).

## 2013-05-04 NOTE — Telephone Encounter (Signed)
To Dr. Varanasi, please advise.  

## 2013-05-05 ENCOUNTER — Other Ambulatory Visit: Payer: Self-pay | Admitting: Internal Medicine

## 2013-05-07 NOTE — Telephone Encounter (Signed)
Pt notified. Faxed to [facility].

## 2013-05-08 ENCOUNTER — Telehealth: Payer: Self-pay | Admitting: Internal Medicine

## 2013-05-08 NOTE — Telephone Encounter (Signed)
New problem   Want to know the status of the request that was sent over on 04/05/13 concerning pt coming off of Blood Thinner for an steroid injection. Please call

## 2013-05-08 NOTE — Telephone Encounter (Signed)
Faxed info yesterday

## 2013-05-08 NOTE — Telephone Encounter (Signed)
Lm with [facility].

## 2013-05-09 ENCOUNTER — Other Ambulatory Visit: Payer: Self-pay

## 2013-05-09 MED ORDER — RIVAROXABAN 20 MG PO TABS: ORAL_TABLET | ORAL | Status: DC

## 2013-05-15 ENCOUNTER — Ambulatory Visit (HOSPITAL_COMMUNITY)
Admission: RE | Admit: 2013-05-15 | Discharge: 2013-05-15 | Disposition: A | Discharge status: Home / Self Care | Payer: 59 | Source: Ambulatory Visit | Attending: Cardiology | Admitting: Cardiology

## 2013-05-15 ENCOUNTER — Encounter (HOSPITAL_COMMUNITY): Payer: Self-pay

## 2013-05-15 VITALS — BP 129/62 | HR 71 | Resp 20 | Wt 255.0 lb

## 2013-05-15 DIAGNOSIS — N183 Chronic kidney disease, stage 3 unspecified: Secondary | ICD-10-CM | POA: Insufficient documentation

## 2013-05-15 DIAGNOSIS — I5022 Chronic systolic (congestive) heart failure: Secondary | ICD-10-CM | POA: Insufficient documentation

## 2013-05-15 DIAGNOSIS — I4891 Unspecified atrial fibrillation: Secondary | ICD-10-CM | POA: Insufficient documentation

## 2013-05-15 MED ORDER — METOLAZONE 5 MG PO TABS: ORAL_TABLET | ORAL | Status: DC

## 2013-05-15 MED ORDER — METOPROLOL SUCCINATE ER 100 MG PO TB24: 100.0000 mg | ORAL_TABLET | Freq: Two times a day (BID) | ORAL | Status: DC

## 2013-05-15 MED ORDER — POTASSIUM CHLORIDE ER 10 MEQ PO TBCR: EXTENDED_RELEASE_TABLET | ORAL | Status: DC

## 2013-05-15 NOTE — Patient Instructions (Addendum)
Your Toprol-XL dosge has changed-100mg  twice daily.  START Metolazone 2.5mg  take one Wed 4/8 and Thurs 4/9, then take twice a week on Tuesdays and Fridays  Potassium 20 MeQ on Tuesdays and Fridays   Moderate exercise 30 minutes a day and a low sodium diet with less than 2L fluid intake is key to optimal care of heart failure.  [facility] test. WITH LABS-BMET, pBNP  Follow up with [place] in 2 WEEKS

## 2013-05-15 NOTE — Progress Notes (Signed)
Patient ID: Larry [name], male   DOB: 17-Jan-1951, 63 y.o.   MRN: [id number] PCP: Dr. Rex Kras Referring MD: Dr. Irish Lack  63 yo with history of chronic systolic CHF (nonischemic cardiomyopathy), CKD, prior CVA, and paroxysmal atrial fibrillation presents for CHF clinic evaluation.  Patient says that he has been known to have a low EF for years.  He has a [employer] ICD.  He had LHC in 2009 with nonobstructive disease. Last echo in 3/15 showed moderate LV dilation with EF 25-30%.  Patient states that his weight is up 30 lbs since 10/14.  Some of this he attributes to changes in his diabetes meds and to dietary indiscretion/lack of exercise.  He can walk several hundred feet in stores then has to stop with fatigue and dyspnea.  He is short of breath after walking up 1 flight of steps.  No bendopnea, orthopnea, or PND.  No chest pain.  Symptoms are relatively stable.  He has a lot of daytime fatigue and takes frequent naps.  He has OSA but was unable to tolerate Bipap/CPAP.  No tachypalpitations to suggest atrial fibrillation recurrence.    I checked patient's Corevue today.  This showed a steady downtrend in the thoracic impedance over the last few weeks.  ICD check showed 2.8% atrial fibrillation, no VT/VF.   Labs (2/15): LDL 103 Labs (3/15): K 4.1, creatinine 1.5, BNP 164  ECG (3/15): a-paced, narrow QRS, poor RWP, inferior Qs.  PMH: 1. Chronic systolic CHF: Nonischemic cardiomyopathy.  Most recent echo in 3/15 with EF 25-30%, moderate LV dilation, mild LVH, moderate diastolic dysfunction, IVC normal, mildly decreased RV systolic function.  Prior echo in 2011 also with EF 25-30%.  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  [employer] Jude ICD.  2. Type II diabetes 3. CKD 4. Monoclonal gammopathy of uncertain etiology.  5. CVA 2007 6. Degenerative disc disease.  7. Pituitary microadenoma: Nonfunctioning, s/p surgery in 2009.  8. OSA: Unable to tolerate Bipap/CPAP. 9. Depression 10. Hypothyroidism 11.  Hyperlipidemia 12. Atrial fibrillation: Paroxysmal.  Patient is on Xarelto.   SH: Married, former cigar smoker, unemployed.   FH: Father with MI x 2 and CHF, strong history of CAD on his father's side.   ROS: All systems reviewed and negative except as per HPI.   Current Outpatient Prescriptions  Medication Sig Dispense Refill  . ALPRAZolam (XANAX) 0.5 MG tablet Take 0.5 mg by mouth 3 (three) times daily as needed.       [name] amLODipine-atorvastatin (CADUET) 5-40 MG per tablet Take 1 tablet by mouth daily.       . DULoxetine (CYMBALTA) 30 MG capsule Take 30 mg by mouth 3 (three) times daily.       . furosemide (LASIX) 80 MG tablet Take 80 mg by mouth 2 (two) times daily.       . insulin lispro (HUMALOG) 100 UNIT/ML injection Inject 30 Units into the skin daily.      . INVOKANA 300 MG TABS Take 1 tablet by mouth every morning.       . metoprolol succinate (TOPROL-XL) 100 MG 24 hr tablet Take 1 tablet (100 mg total) by mouth 2 (two) times daily.  135 tablet  2  . Multiple Vitamin (MULITIVITAMIN WITH MINERALS) TABS Take 1 tablet by mouth daily.      [name] oxyCODONE-acetaminophen (PERCOCET) 10-325 MG per tablet Take 1 tablet by mouth every 4 (four) hours as needed. For pain      . Rivaroxaban (XARELTO) 20 MG TABS tablet  TAKE 1 TABLET DAILY  90 tablet  3  . spironolactone (ALDACTONE) 25 MG tablet Take 25 mg by mouth every other day.       . traZODone (DESYREL) 50 MG tablet Take 50 mg by mouth at bedtime.       . valsartan (DIOVAN) 160 MG tablet TAKE 1 TABLET DAILY  90 tablet  2  . zolpidem (AMBIEN) 10 MG tablet Take 10 mg by mouth at bedtime as needed. For sleep      . metolazone (ZAROXOLYN) 5 MG tablet TAKE ONE-HALF EVERY Tuesday AND Thursday 30 MINS PRIOR TO LASIX DOSE  90 tablet  3  . potassium chloride (K-DUR) 10 MEQ tablet TAKE TWO TABS (20 MEQ) ON TUESDAYS AND THURSDAYS  15 tablet  3   No current facility-administered medications for this encounter.    BP 129/62  Pulse 71  Resp 20  Wt 255  lb (115.667 kg)  SpO2 98% General: NAD Neck: Thick, JVP difficult but cannot detect elevation, no thyromegaly or thyroid nodule.  Lungs: Clear to auscultation bilaterally with normal respiratory effort. CV: Nondisplaced PMI.  Heart regular S1/S2, no S3/S4, no murmur.  No peripheral edema.  No carotid bruit.  Normal pedal pulses.  Abdomen: Soft, nontender, no hepatosplenomegaly, no distention.  Skin: Intact without lesions or rashes.  Neurologic: Alert and oriented x 3.  Psych: Normal affect. Extremities: No clubbing or cyanosis.   Assessment/plan: 1. Chronic systolic CHF: Nonischemic cardiomyopathy, known for years.  EF has been stable in the 25-30% range.  NYHA class III symptoms, relatively stable.  However, he has gained 30 lbs since 10/14.  Exam did not indicate significant volume overload, but Corevue showed declining thoracici impedance suggestive of fluid buildup. Diet is fairly high in sodium.  - Can increase Toprol XL to 100 mg bid (goal dose) - Continue Current valsartan and spironolactone.  - We discussed trying to cut back on dietary sodium and the need for regular exercise (ideally 30 minute walk 5 days/week).   - Given volume overload by Corevue and weight gain, I will have him take 2.5 mg metolazone 30 minutes before morning Lasix tomorrow and Thursday.  He will then take metolazone every Tuesday and Friday (twice a week).    - I will set him up for CPX to get objective evaluation of functional status.  He is relatively stable now but may need advanced therapies in the future.  - BMET next week, followup in office in 2 wks.  2. CKD: Will repeat BMET next week with use of metolazone.  If GFR decreases further, will need to decrease Xarelto to 15 mg daily.  3. Atrial fibrillation: Paroxysmal.  Patient had 2.8% atrial fibrillation on ICD interrogation today.  Continue Xarelto.   Loralie Champagne 05/15/2013

## 2013-05-16 ENCOUNTER — Other Ambulatory Visit (HOSPITAL_COMMUNITY): Payer: Self-pay | Admitting: *Deleted

## 2013-05-16 MED ORDER — METOPROLOL SUCCINATE ER 100 MG PO TB24: 100.0000 mg | ORAL_TABLET | Freq: Two times a day (BID) | ORAL | Status: DC

## 2013-05-19 ENCOUNTER — Emergency Department (HOSPITAL_COMMUNITY): Payer: 59

## 2013-05-19 ENCOUNTER — Encounter (HOSPITAL_COMMUNITY): Payer: Self-pay | Admitting: Emergency Medicine

## 2013-05-19 ENCOUNTER — Observation Stay (HOSPITAL_COMMUNITY)
Admission: EM | Admit: 2013-05-19 | Discharge: 2013-05-22 | Disposition: A | Discharge status: Home / Self Care | Payer: 59 | Attending: Internal Medicine | Admitting: Internal Medicine

## 2013-05-19 ENCOUNTER — Telehealth: Payer: Self-pay | Admitting: Physician Assistant

## 2013-05-19 DIAGNOSIS — Z794 Long term (current) use of insulin: Secondary | ICD-10-CM | POA: Insufficient documentation

## 2013-05-19 DIAGNOSIS — Z79899 Other long term (current) drug therapy: Secondary | ICD-10-CM | POA: Insufficient documentation

## 2013-05-19 DIAGNOSIS — I10 Essential (primary) hypertension: Secondary | ICD-10-CM | POA: Diagnosis present

## 2013-05-19 DIAGNOSIS — Z8673 Personal history of transient ischemic attack (TIA), and cerebral infarction without residual deficits: Secondary | ICD-10-CM | POA: Insufficient documentation

## 2013-05-19 DIAGNOSIS — Y939 Activity, unspecified: Secondary | ICD-10-CM | POA: Insufficient documentation

## 2013-05-19 DIAGNOSIS — W2209XA Striking against other stationary object, initial encounter: Secondary | ICD-10-CM | POA: Insufficient documentation

## 2013-05-19 DIAGNOSIS — F3289 Other specified depressive episodes: Secondary | ICD-10-CM | POA: Insufficient documentation

## 2013-05-19 DIAGNOSIS — I4891 Unspecified atrial fibrillation: Secondary | ICD-10-CM

## 2013-05-19 DIAGNOSIS — Z87891 Personal history of nicotine dependence: Secondary | ICD-10-CM | POA: Insufficient documentation

## 2013-05-19 DIAGNOSIS — S0990XA Unspecified injury of head, initial encounter: Secondary | ICD-10-CM | POA: Insufficient documentation

## 2013-05-19 DIAGNOSIS — E7800 Pure hypercholesterolemia, unspecified: Secondary | ICD-10-CM | POA: Insufficient documentation

## 2013-05-19 DIAGNOSIS — F411 Generalized anxiety disorder: Secondary | ICD-10-CM | POA: Insufficient documentation

## 2013-05-19 DIAGNOSIS — I5022 Chronic systolic (congestive) heart failure: Secondary | ICD-10-CM

## 2013-05-19 DIAGNOSIS — N529 Male erectile dysfunction, unspecified: Secondary | ICD-10-CM | POA: Insufficient documentation

## 2013-05-19 DIAGNOSIS — F329 Major depressive disorder, single episode, unspecified: Secondary | ICD-10-CM | POA: Insufficient documentation

## 2013-05-19 DIAGNOSIS — I428 Other cardiomyopathies: Secondary | ICD-10-CM | POA: Insufficient documentation

## 2013-05-19 DIAGNOSIS — G4733 Obstructive sleep apnea (adult) (pediatric): Secondary | ICD-10-CM | POA: Insufficient documentation

## 2013-05-19 DIAGNOSIS — D751 Secondary polycythemia: Secondary | ICD-10-CM | POA: Insufficient documentation

## 2013-05-19 DIAGNOSIS — E11329 Type 2 diabetes mellitus with mild nonproliferative diabetic retinopathy without macular edema: Secondary | ICD-10-CM | POA: Insufficient documentation

## 2013-05-19 DIAGNOSIS — R55 Syncope and collapse: Principal | ICD-10-CM

## 2013-05-19 DIAGNOSIS — N183 Chronic kidney disease, stage 3 unspecified: Secondary | ICD-10-CM

## 2013-05-19 DIAGNOSIS — IMO0002 Reserved for concepts with insufficient information to code with codable children: Secondary | ICD-10-CM | POA: Insufficient documentation

## 2013-05-19 DIAGNOSIS — N289 Disorder of kidney and ureter, unspecified: Secondary | ICD-10-CM

## 2013-05-19 DIAGNOSIS — E119 Type 2 diabetes mellitus without complications: Secondary | ICD-10-CM | POA: Diagnosis present

## 2013-05-19 DIAGNOSIS — E1149 Type 2 diabetes mellitus with other diabetic neurological complication: Secondary | ICD-10-CM

## 2013-05-19 DIAGNOSIS — I2510 Atherosclerotic heart disease of native coronary artery without angina pectoris: Secondary | ICD-10-CM | POA: Insufficient documentation

## 2013-05-19 DIAGNOSIS — D472 Monoclonal gammopathy: Secondary | ICD-10-CM | POA: Insufficient documentation

## 2013-05-19 DIAGNOSIS — E038 Other specified hypothyroidism: Secondary | ICD-10-CM | POA: Insufficient documentation

## 2013-05-19 DIAGNOSIS — I959 Hypotension, unspecified: Secondary | ICD-10-CM

## 2013-05-19 DIAGNOSIS — R0602 Shortness of breath: Secondary | ICD-10-CM

## 2013-05-19 DIAGNOSIS — Y929 Unspecified place or not applicable: Secondary | ICD-10-CM | POA: Insufficient documentation

## 2013-05-19 DIAGNOSIS — E236 Other disorders of pituitary gland: Secondary | ICD-10-CM | POA: Insufficient documentation

## 2013-05-19 DIAGNOSIS — E669 Obesity, unspecified: Secondary | ICD-10-CM | POA: Insufficient documentation

## 2013-05-19 DIAGNOSIS — R42 Dizziness and giddiness: Secondary | ICD-10-CM | POA: Insufficient documentation

## 2013-05-19 DIAGNOSIS — E1142 Type 2 diabetes mellitus with diabetic polyneuropathy: Secondary | ICD-10-CM | POA: Insufficient documentation

## 2013-05-19 DIAGNOSIS — D497 Neoplasm of unspecified behavior of endocrine glands and other parts of nervous system: Secondary | ICD-10-CM | POA: Insufficient documentation

## 2013-05-19 LAB — URINALYSIS, ROUTINE W REFLEX MICROSCOPIC
BILIRUBIN URINE: NEGATIVE
GLUCOSE, UA: 250 mg/dL — AB
Hgb urine dipstick: NEGATIVE
KETONES UR: NEGATIVE mg/dL
Leukocytes, UA: NEGATIVE
NITRITE: NEGATIVE
PH: 5.5 (ref 5.0–8.0)
Protein, ur: NEGATIVE mg/dL
Specific Gravity, Urine: 1.015 (ref 1.005–1.030)
UROBILINOGEN UA: 0.2 mg/dL (ref 0.0–1.0)

## 2013-05-19 LAB — HEPATIC FUNCTION PANEL
ALK PHOS: 80 U/L (ref 39–117)
ALT: 17 U/L (ref 0–53)
AST: 14 U/L (ref 0–37)
Albumin: 3.9 g/dL (ref 3.5–5.2)
Total Bilirubin: 0.4 mg/dL (ref 0.3–1.2)
Total Protein: 7.9 g/dL (ref 6.0–8.3)

## 2013-05-19 LAB — CBC
HEMATOCRIT: 46 % (ref 39.0–52.0)
Hemoglobin: 16.5 g/dL (ref 13.0–17.0)
MCH: 33.8 pg (ref 26.0–34.0)
MCHC: 35.9 g/dL (ref 30.0–36.0)
MCV: 94.3 fL (ref 78.0–100.0)
Platelets: 232 10*3/uL (ref 150–400)
RBC: 4.88 MIL/uL (ref 4.22–5.81)
RDW: 12.9 % (ref 11.5–15.5)
WBC: 8.7 10*3/uL (ref 4.0–10.5)

## 2013-05-19 LAB — BASIC METABOLIC PANEL
BUN: 76 mg/dL — AB (ref 6–23)
CHLORIDE: 90 meq/L — AB (ref 96–112)
CO2: 22 mEq/L (ref 19–32)
Calcium: 9.4 mg/dL (ref 8.4–10.5)
Creatinine, Ser: 3.28 mg/dL — ABNORMAL HIGH (ref 0.50–1.35)
GFR calc Af Amer: 22 mL/min — ABNORMAL LOW (ref >90)
GFR, EST NON AFRICAN AMERICAN: 19 mL/min — AB (ref >90)
GLUCOSE: 202 mg/dL — AB (ref 70–99)
Potassium: 3.6 mEq/L — ABNORMAL LOW (ref 3.7–5.3)
Sodium: 135 mEq/L — ABNORMAL LOW (ref 137–147)

## 2013-05-19 LAB — GLUCOSE, CAPILLARY: Glucose-Capillary: 93 mg/dL (ref 70–99)

## 2013-05-19 LAB — PROTIME-INR
INR: 1.55 — AB (ref 0.00–1.49)
Prothrombin Time: 18.2 seconds — ABNORMAL HIGH (ref 11.6–15.2)

## 2013-05-19 LAB — I-STAT TROPONIN, ED: Troponin i, poc: 0.06 ng/mL (ref 0.00–0.08)

## 2013-05-19 MED ORDER — SODIUM CHLORIDE 0.9 % IV BOLUS (SEPSIS)
500.0000 mL | Freq: Once | INTRAVENOUS | Status: AC
  Administered 2013-05-19: 500 mL via INTRAVENOUS

## 2013-05-19 MED ORDER — ADULT MULTIVITAMIN W/MINERALS CH
1.0000 | ORAL_TABLET | Freq: Every day | ORAL | Status: DC
  Administered 2013-05-20 – 2013-05-22 (×3): 1 via ORAL
  Filled 2013-05-19 (×3): qty 1

## 2013-05-19 MED ORDER — OXYCODONE-ACETAMINOPHEN 10-325 MG PO TABS: 1.0000 | ORAL_TABLET | ORAL | Status: DC | PRN

## 2013-05-19 MED ORDER — ZOLPIDEM TARTRATE 5 MG PO TABS: 5.0000 mg | ORAL_TABLET | Freq: Every evening | ORAL | Status: DC | PRN

## 2013-05-19 MED ORDER — ALPRAZOLAM 0.5 MG PO TABS: 0.5000 mg | ORAL_TABLET | Freq: Three times a day (TID) | ORAL | Status: DC | PRN

## 2013-05-19 MED ORDER — OXYCODONE-ACETAMINOPHEN 5-325 MG PO TABS
1.0000 | ORAL_TABLET | ORAL | Status: DC | PRN
  Administered 2013-05-19 – 2013-05-21 (×6): 1 via ORAL
  Filled 2013-05-19 (×6): qty 1

## 2013-05-19 MED ORDER — INSULIN ASPART 100 UNIT/ML ~~LOC~~ SOLN: 0.0000 [IU] | Freq: Every day | SUBCUTANEOUS | Status: DC

## 2013-05-19 MED ORDER — RIVAROXABAN 15 MG PO TABS
15.0000 mg | ORAL_TABLET | Freq: Every day | ORAL | Status: DC
  Administered 2013-05-19 – 2013-05-21 (×3): 15 mg via ORAL
  Filled 2013-05-19 (×4): qty 1

## 2013-05-19 MED ORDER — DULOXETINE HCL 30 MG PO CPEP
30.0000 mg | ORAL_CAPSULE | Freq: Three times a day (TID) | ORAL | Status: DC
  Administered 2013-05-19 – 2013-05-22 (×8): 30 mg via ORAL
  Filled 2013-05-19 (×10): qty 1

## 2013-05-19 MED ORDER — METOPROLOL SUCCINATE ER 100 MG PO TB24
100.0000 mg | ORAL_TABLET | Freq: Two times a day (BID) | ORAL | Status: DC
  Administered 2013-05-19: 50 mg via ORAL
  Administered 2013-05-20 – 2013-05-22 (×5): 100 mg via ORAL
  Filled 2013-05-19 (×7): qty 1

## 2013-05-19 MED ORDER — ATORVASTATIN CALCIUM 40 MG PO TABS
40.0000 mg | ORAL_TABLET | Freq: Every day | ORAL | Status: DC
  Administered 2013-05-20 – 2013-05-22 (×3): 40 mg via ORAL
  Filled 2013-05-19 (×3): qty 1

## 2013-05-19 MED ORDER — OXYCODONE HCL 5 MG PO TABS
5.0000 mg | ORAL_TABLET | ORAL | Status: DC | PRN
  Administered 2013-05-19 – 2013-05-21 (×6): 5 mg via ORAL
  Filled 2013-05-19 (×6): qty 1

## 2013-05-19 MED ORDER — LORAZEPAM 1 MG PO TABS
0.5000 mg | ORAL_TABLET | Freq: Once | ORAL | Status: AC
  Administered 2013-05-19: 0.5 mg via ORAL
  Filled 2013-05-19: qty 1

## 2013-05-19 MED ORDER — AMLODIPINE BESYLATE 5 MG PO TABS
5.0000 mg | ORAL_TABLET | Freq: Every day | ORAL | Status: DC
  Administered 2013-05-20 – 2013-05-22 (×3): 5 mg via ORAL
  Filled 2013-05-19 (×3): qty 1

## 2013-05-19 MED ORDER — INSULIN ASPART 100 UNIT/ML ~~LOC~~ SOLN
0.0000 [IU] | Freq: Three times a day (TID) | SUBCUTANEOUS | Status: DC
  Administered 2013-05-20: 2 [IU] via SUBCUTANEOUS
  Administered 2013-05-20: 3 [IU] via SUBCUTANEOUS
  Administered 2013-05-20 – 2013-05-21 (×3): 2 [IU] via SUBCUTANEOUS
  Administered 2013-05-21: 3 [IU] via SUBCUTANEOUS
  Administered 2013-05-22: 5 [IU] via SUBCUTANEOUS

## 2013-05-19 NOTE — H&P (Signed)
History and Physical  Patient ID: Larry [name] MRN: [id number], DOB: [birth year] Date of Encounter: 05/19/2013, 8:09 PM Primary Physician: [name], MD Primary Cardiologist:   Chief Complaint: Syncope Reason for Admission: AKI , hypotension   HPI: 63 yo with history of chronic systolic CHF (nonischemic cardiomyopathy, EF 25-30 % ), CKD, prior CVA, and paroxysmal atrial fibrillation presents with syncope    Pt recently seen by Dr Aundra Dubin in heart failure clinic. Pt apparently complainedned of weight gain , and did not tell his physician that he had indavertently been taking his lasix 80 QD instead of the prescribed 80 mg BID. The office prescribed metolazone. Additionally pt realized his mistake and started taking his lasix at the original dose.  He felt excessively dizzy, lightheaded and orthostatic . Today while standing up he had syncopal event and decided to come to the ER . Pt denied any ICD shocks , palpitations or chest pain during this .  Pt  Also denies any SOB , orthopnea, PND , LE edema , Syncope ,claudcation , focal weakness, or bleeding diathesis .     Past Medical History  Diagnosis Date  . Cardiomyopathy     nonischemic (EF 25%)  . CAD (coronary artery disease)     nonobstructive CAD by cath 2009  . Obesity   . CVA (cerebral vascular accident)     CVA 2007 without residual deficit  . Pituitary tumor      (nonfunctionging pituitary microadenoma) s/p gamma knife surgery at [facility] 2009 with neuropathy and retinopathy  . Depression   . Erectile dysfunction   . DDD (degenerative disc disease)   . OSA (obstructive sleep apnea)   . Monoclonal gammopathy   . CRI (chronic renal insufficiency)     CRI (baseline creatinine 1.6)  . CKD (chronic kidney disease), stage III     moderate  . 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 Hypogonadotropic hypogonadism   . Subclinical hypothyroidism   . Polycythemia, secondary     improved/resolved  .  Hypercholesterolemia   . Back pain     F/B [name]  . Monoclonal gammopathy of unknown significance     per [name]  . 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 Neck pain     F/B [name]  . PAF (paroxysmal atrial fibrillation) 06/04/10    Hx of, has device  . History of diverticulitis of colon   . Type II or unspecified type diabetes mellitus with neurological manifestations, uncontrolled 2000  . Nonproliferative diabetic retinopathy [id number])      Most Recent Cardiac Studies:    Surgical History:  Past Surgical History  Procedure Laterality Date  . Gamma knife surgery for pituitary tumor    . Tonsillectomy    . Back surgery    . Cardiac defibrillator placement  02/19/10    By JA.      Home Meds: Prior to Admission medications   Medication Sig Start Date End Date Taking? Authorizing Provider  ALPRAZolam Duanne Moron) 0.5 MG tablet Take 0.5 mg by mouth 3 (three) times daily as needed for anxiety.  02/18/12  Yes Historical Provider, MD  amLODipine-atorvastatin (CADUET) 5-40 MG per tablet Take 1 tablet by mouth daily.    Yes Historical Provider, MD  DULoxetine (CYMBALTA) 30 MG capsule Take 30 mg by mouth 3 (three) times daily.    Yes Historical Provider, MD  furosemide (LASIX) 80 MG tablet Take 80 mg by mouth  2 (two) times daily.    Yes Historical Provider, MD  insulin lispro (HUMALOG) 100 UNIT/ML injection Inject 30 Units into the skin daily.   Yes Historical Provider, MD  INVOKANA 300 MG TABS Take 300 mg by mouth every morning.  12/11/11  Yes Historical Provider, MD  metolazone (ZAROXOLYN) 5 MG tablet Take 2.5 mg by mouth 2 (two) times a week. On tuesdays and thursdays 30 mins prior to lasix   Yes Historical Provider, MD  metoprolol succinate (TOPROL-XL) 100 MG 24 hr tablet Take 1 tablet (100 mg total) by mouth 2 (two) times daily. 05/16/13  Yes [name], MD  Multiple Vitamin (MULITIVITAMIN WITH MINERALS) TABS Take 1 tablet by mouth  daily.   Yes Historical Provider, MD  oxyCODONE-acetaminophen (PERCOCET) 10-325 MG per tablet Take 1 tablet by mouth every 4 (four) hours as needed. For pain   Yes Historical Provider, MD  potassium chloride (K-DUR) 10 MEQ tablet Take 20 mEq by mouth 2 (two) times a week. On tuesdays and thursdays   Yes Historical Provider, MD  Rivaroxaban (XARELTO) 20 MG TABS tablet Take 20 mg by mouth daily with supper.   Yes Historical Provider, MD  spironolactone (ALDACTONE) 25 MG tablet Take 25 mg by mouth every other day.    Yes Historical Provider, MD  valsartan (DIOVAN) 160 MG tablet Take 160 mg by mouth daily.   Yes Historical Provider, MD  zolpidem (AMBIEN) 10 MG tablet Take 10 mg by mouth at bedtime as needed. For sleep   Yes Historical Provider, MD    Allergies:  Allergies  Allergen Reactions  . Bydureon [Exenatide] Other (See Comments)    sweating  . Losartan Potassium Other (See Comments)    insomnia    History   Social History  . Marital Status: Married    Spouse Name: N/A    Number of Children: N/A  . Years of Education: N/A   Occupational History  . Not on file.   Social History Main Topics  . Smoking status: Former Smoker    Types: Cigars    Quit date: 02/09/1983  . Smokeless tobacco: Not on file     Comment: 25 yrs ago  . Alcohol Use: No  . Drug Use: No  . Sexual Activity: Not on file   Other Topics Concern  . Not on file   Social History Narrative  . No narrative on file     Family History  Problem Relation Age of Onset  . Lung cancer    . Cancer Mother     skin  . Dementia Mother   . Depression Mother   . Heart disease Father   . Hypertension Father   . Lung cancer Father   . Obesity Father   . Obesity Sister   . Hypertension Sister   . Diabetes Brother   . Hypertension Brother   . Heart disease Brother   . Obesity Brother   . Lung cancer Paternal Uncle   . Diabetes Paternal Uncle     requiring leg amputations     Review of Systems: General:  negative for chills, fever, night sweats or weight changes.  Cardiovascular: see HPI  Dermatological: negative for rash Respiratory: negative for cough or wheezing Urologic: negative for hematuria Abdominal: negative for nausea, vomiting, diarrhea, bright red blood per rectum, melena, or hematemesis Neurologic: negative for visual changes, All other systems reviewed and are otherwise negative except as noted above.  Labs:   Lab Results  Component Value Date  WBC 8.7 05/19/2013   HGB 16.5 05/19/2013   HCT 46.0 05/19/2013   MCV 94.3 05/19/2013   PLT 232 05/19/2013    Recent Labs Lab 05/19/13 1623 05/19/13 1646  NA 135*  --   K 3.6*  --   CL 90*  --   CO2 22  --   BUN 76*  --   CREATININE 3.28*  --   CALCIUM 9.4  --   PROT  --  7.9  BILITOT  --  0.4  ALKPHOS  --  80  ALT  --  17  AST  --  14  GLUCOSE 202*  --    No results found for this basename: CKTOTAL, CKMB, TROPONINI,  in the last 72 hours No results found for this basename: CHOL, HDL, LDLCALC, TRIG   Lab Results  Component Value Date   DDIMER  Value: 0.48        AT THE INHOUSE ESTABLISHED CUTOFF VALUE OF 0.48 ug/mL FEU, THIS ASSAY HAS BEEN DOCUMENTED IN THE LITERATURE TO HAVE A SENSITIVITY AND NEGATIVE PREDICTIVE VALUE OF AT LEAST 98 TO 99%.  THE TEST RESULT SHOULD BE CORRELATED WITH AN ASSESSMENT OF THE CLINICAL PROBABILITY OF DVT / VTE. 07/27/2008    Radiology/Studies:  Dg Chest 2 View  05/19/2013   CLINICAL DATA:  Syncope today  EXAM: CHEST  2 VIEW  COMPARISON:  DG CHEST 2V dated 02/15/2012  FINDINGS: Stable mild cardiac enlargement. Two lead pacer stable. Vascular pattern normal. Lungs clear.  IMPRESSION: No active cardiopulmonary disease.   Electronically Signed   By: Skipper Cliche M.D.   On: 05/19/2013 18:02   Ct Head Wo Contrast  05/19/2013   CLINICAL DATA:  Syncope, head injury, + Xeralto  EXAM: CT HEAD WITHOUT CONTRAST  TECHNIQUE: Contiguous axial images were obtained from the base of the skull through the  vertex without intravenous contrast.  COMPARISON:  CT C SPINE W/CM dated 09/13/2011; CT C SPINE W/CM dated 01/27/2011; MR HEAD WO/W CM dated 12/19/2007  FINDINGS: There is no evidence of acute hemorrhage, mass effect nor hydrocephalus. An area of encephalomalacic change within the right posterior parietal region consistent with an area of chronic MCA distribution infarction. There are areas of mild low attenuation within the subcortical, deep, and periventricular white matter regions consistent with mild small vessel white matter ischaemia. The patient is pituitary adenoma is again appreciated.  The calvarium is unremarkable.  The visualized paranasal sinuses and mastoid air cells are patent.  IMPRESSION: Chronic and involutional changes without evidence of acute abnormalities.   Electronically Signed   By: Margaree Mackintosh M.D.   On: 05/19/2013 19:53     EKG:   Physical Exam: Blood pressure 96/59, pulse 61, temperature 97.3 F (36.3 C), temperature source Oral, resp. rate 11, height 5' 10.5" (1.791 m), weight 113.399 kg (250 lb), SpO2 97.00%. General: Well developed, well nourished, in no acute distress. Head: Normocephalic, atraumatic, sclera non-icteric, no xanthomas, nares are without discharge.  Neck: Negative for carotid bruits. JVD not elevated. Lungs: Clear bilaterally to auscultation without wheezes, rales, or rhonchi. Breathing is unlabored. Heart: RRR with S1 S2. No murmurs, rubs, or gallops appreciated. Abdomen: Soft, non-tender, non-distended with normoactive bowel sounds. No hepatomegaly. No rebound/guarding. No obvious abdominal masses. Msk:  Strength and tone appear normal for age. Extremities: No clubbing or cyanosis. No edema.  Distal pedal pulses are 2+ and equal bilaterally. Neuro: Alert and oriented X 3. No focal deficit. No facial asymmetry. Moves all extremities spontaneously. Psych:  Responds to questions appropriately with a normal affect.    ASSESSMENT AND PLAN:   Ass Syncope  CMP  AKI  DM type II HTN    Plan  -Hold diuretics , ARB and aldactone in face of AKI , hypotension  -Monitor Cr , recheck orthostatics in am and will start medication accordingly  -decrease Xarelto to 15 mg QD, if GFR decreases further will need to hold this    Signed, Grafton Folk M.D  05/19/2013, 8:09 PM

## 2013-05-19 NOTE — ED Notes (Addendum)
Pt wanted to correct Metoprolol increase he previously stated earlier.  The cardiologist wanted to increase med but called pt back and told him to stay on previous dose which is 100 mg in am, 50 mg in pm. PA made aware.

## 2013-05-19 NOTE — ED Notes (Signed)
When standing patient for ortho vitals he became very dizzy and agitated had to sit before we could complete

## 2013-05-19 NOTE — ED Provider Notes (Signed)
CSN: [id number]     Arrival date & time 05/19/13  1614 History   First MD Initiated Contact with Patient 05/19/13 1639     Chief Complaint  Patient presents with  . Loss of Consciousness     (Consider location/radiation/quality/duration/timing/severity/associated sxs/prior Treatment) HPI  63 yo with history of chronic systolic CHF (nonischemic cardiomyopathy), CKD, prior CVA, and paroxysmal atrial fibrillation presents for evaluation of a syncopal episode prior to arrival. At his recent cardiology visit on 05/15/2013 his medications changes are as below.  Per patient and Family member Increased 40 mg lasix BID --> 80 mg lasix BID Increased Torprol from 75 mg BID  --> 100 mg BID Added on Metolazone 5mg  half dose on Tues and Thurs before lasix.  He reports that he has been feeling dizzy and lightheaded since then. Today he noticed his blood pressures were low. He went from sitting to standing, felt very dizzy, paused, then took a couple of steps and synopsized, hitting his head, according to wife. He immediately awoke and was mildly confused for < 5 seconds according to wife. Since then he has been back at baseline. He has no complaints aside from feeling thirsty. His CBG is 360 in the ED. He admits that he does not typically check his sugar but has not seen it this high before.  Pt has a pacemaker and denies feeling any shocks.  Past Medical History  Diagnosis Date  . Cardiomyopathy     nonischemic (EF 25%)  . CAD (coronary artery disease)     nonobstructive CAD by cath 2009  . Obesity   . CVA (cerebral vascular accident)     CVA 2007 without residual deficit  . Pituitary tumor      (nonfunctionging pituitary microadenoma) s/p gamma knife surgery at [facility] 2009 with neuropathy and retinopathy  . Depression   . Erectile dysfunction   . DDD (degenerative disc disease)   . OSA (obstructive sleep apnea)   . Monoclonal gammopathy   . CRI (chronic renal insufficiency)     CRI  (baseline creatinine 1.6)  . CKD (chronic kidney disease), stage III     moderate  . 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 Hypogonadotropic hypogonadism   . Subclinical hypothyroidism   . Polycythemia, secondary     improved/resolved  . Hypercholesterolemia   . Back pain     F/B [name]  . Monoclonal gammopathy of unknown significance     per [name]  . 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 Neck pain     F/B [name]  . PAF (paroxysmal atrial fibrillation) 06/04/10    Hx of, has device  . History of diverticulitis of colon   . Type II or unspecified type diabetes mellitus with neurological manifestations, uncontrolled 2000  . Nonproliferative diabetic retinopathy QQP(619.50)    Past Surgical History  Procedure Laterality Date  . Gamma knife surgery for pituitary tumor    . Tonsillectomy    . Back surgery    . Cardiac defibrillator placement  02/19/10    By JA.    Family History  Problem Relation Age of Onset  . Lung cancer    . Cancer Mother     skin  . Dementia Mother   . Depression Mother   . Heart disease Father   . Hypertension Father   . Lung cancer Father   . Obesity Father   . Obesity Sister   .  Hypertension Sister   . Diabetes Brother   . Hypertension Brother   . Heart disease Brother   . Obesity Brother   . Lung cancer Paternal Uncle   . Diabetes Paternal Uncle     requiring leg amputations    History  Substance Use Topics  . Smoking status: Former Smoker    Types: Cigars    Quit date: 02/09/1983  . Smokeless tobacco: Not on file     Comment: 25 yrs ago  . Alcohol Use: No    Review of Systems   Review of Systems  Gen: no weight loss, fevers, chills, night sweats, +syncope Eyes: no discharge or drainage, no occular pain or visual changes  Nose: no epistaxis or rhinorrhea  Mouth: no dental pain, no sore throat  Neck: no neck pain  Lungs:No wheezing, coughing or hemoptysis CV: no chest pain,  palpitations, dependent edema or orthopnea  Abd: no abdominal pain, nausea, vomiting, diarrhea GU: no dysuria or gross hematuria  MSK:  No muscle weakness or pain Neuro: no headache, no focal neurologic deficits, +head injury Skin: no rash or wounds Psyche: + anxiety    Allergies  Bydureon and Losartan potassium  Home Medications   Current Outpatient Rx  Name  Route  Sig  Dispense  Refill  . ALPRAZolam (XANAX) 0.5 MG tablet   Oral   Take 0.5 mg by mouth 3 (three) times daily as needed for anxiety.          [name] amLODipine-atorvastatin (CADUET) 5-40 MG per tablet   Oral   Take 1 tablet by mouth daily.          . DULoxetine (CYMBALTA) 30 MG capsule   Oral   Take 30 mg by mouth 3 (three) times daily.          . furosemide (LASIX) 80 MG tablet   Oral   Take 80 mg by mouth 2 (two) times daily.          . insulin lispro (HUMALOG) 100 UNIT/ML injection   Subcutaneous   Inject 30 Units into the skin daily.         . INVOKANA 300 MG TABS   Oral   Take 300 mg by mouth every morning.          . metolazone (ZAROXOLYN) 5 MG tablet   Oral   Take 2.5 mg by mouth 2 (two) times a week. On tuesdays and thursdays 30 mins prior to lasix         . metoprolol succinate (TOPROL-XL) 100 MG 24 hr tablet   Oral   Take 1 tablet (100 mg total) by mouth 2 (two) times daily.   180 tablet   2   . Multiple Vitamin (MULITIVITAMIN WITH MINERALS) TABS   Oral   Take 1 tablet by mouth daily.         [name] oxyCODONE-acetaminophen (PERCOCET) 10-325 MG per tablet   Oral   Take 1 tablet by mouth every 4 (four) hours as needed. For pain         . potassium chloride (K-DUR) 10 MEQ tablet   Oral   Take 20 mEq by mouth 2 (two) times a week. On tuesdays and thursdays         . Rivaroxaban (XARELTO) 20 MG TABS tablet   Oral   Take 20 mg by mouth daily with supper.         [name] spironolactone (ALDACTONE) 25 MG tablet   Oral   Take 25  mg by mouth every other day.          .  valsartan (DIOVAN) 160 MG tablet   Oral   Take 160 mg by mouth daily.         [name] zolpidem (AMBIEN) 10 MG tablet   Oral   Take 10 mg by mouth at bedtime as needed. For sleep          BP 96/54  Pulse 64  Temp(Src) 97.3 F (36.3 C) (Oral)  Resp 9  Ht 5' 10.5" (1.791 m)  Wt 250 lb (113.399 kg)  BMI 35.35 kg/m2  SpO2 98% Physical Exam  Nursing note and vitals reviewed. Constitutional: He is oriented to person, place, and time. He appears well-developed and well-nourished. No distress.  HENT:  Head: Normocephalic and atraumatic.  Eyes: Pupils are equal, round, and reactive to light.  Neck: Normal range of motion. Neck supple.  Cardiovascular: Normal rate and regular rhythm.   Pt is orthostatic  Pulmonary/Chest: Effort normal. No accessory muscle usage. No respiratory distress. He has no decreased breath sounds. He has no rales.  No coughing  Abdominal: Soft.  Musculoskeletal:  No significant lower extremity edema  Neurological: He is alert and oriented to person, place, and time. No cranial nerve deficit or sensory deficit.  Pt is generally weak. Unable to tolerate standing  Skin: Skin is warm and dry.  Psychiatric:  Pt is anxious. Pts wife reports this is baseline for him    ED Course  Procedures (including critical care time) Labs Review Labs Reviewed  BASIC METABOLIC PANEL - Abnormal; Notable for the following:    Sodium 135 (*)    Potassium 3.6 (*)    Chloride 90 (*)    Glucose, Bld 202 (*)    BUN 76 (*)    Creatinine, Ser 3.28 (*)    GFR calc non Af Amer 19 (*)    GFR calc Af Amer 22 (*)    All other components within normal limits  PROTIME-INR - Abnormal; Notable for the following:    Prothrombin Time 18.2 (*)    INR 1.55 (*)    All other components within normal limits  CBC  HEPATIC FUNCTION PANEL  URINALYSIS, ROUTINE W REFLEX MICROSCOPIC  I-STAT TROPOININ, ED   Imaging Review Dg Chest 2 View  05/19/2013   CLINICAL DATA:  Syncope today  EXAM: CHEST   2 VIEW  COMPARISON:  DG CHEST 2V dated 02/15/2012  FINDINGS: Stable mild cardiac enlargement. Two lead pacer stable. Vascular pattern normal. Lungs clear.  IMPRESSION: No active cardiopulmonary disease.   Electronically Signed   By: Skipper Cliche M.D.   On: 05/19/2013 18:02     EKG Interpretation None     CHANDAN, BEAULIEU [id number] 19-May-2013 19:09:00 [facility] -[facility]/ED ROUTINE RECORD [id number] yr)Room:E47C  Male Caucasian  Sinus rhythm Ventricular premature complex Prolonged PR interval Extensive anterior infarct, old Prolonged QT interval 34mm/s 15mm/mV 100Hz  8.0.1 CID: [id number] Referred by: Unconfirmed Vent. rate 61 BPM PR interval 293 ms QRS duration 113 ms QT/QTc 574/578 ms P-R-T axes 50 226 18 Loc:11 Technician: [id number] Test ind: EID: EDT: ORDER: ACCOUNT: [id number]  MDM   Final diagnoses:  Renal insufficiency  Syncope  Hypotension     The patients blood pressure has been responding well to gentle fluids. He was given 511ml IV saline and then re-evaluated. NO coughing, no SOB, no CP. Therefore a second 500 mL saline IV ordered. Pt has been monitored very closely.  His renal function is decreased, pt most likely dry from increased beta blocker and diuretic. I spoke with cardiology who recommended not to give more than a liter of fluids, since patients EF is so low, risk of fluid overload. He has agreed to come see the patient. Delay in Head CT due to patient being anxious and needing to return to room from scan. Given 0.5 mg PO Ativan for this. Pt will retry scan when able. Head CT ordered due to being on Xeralto and wife reports he hit his head when he fell.  7:07pm Cardiology will advise if they plan to admit or if they will ask me to contact medicine for admission.   7: 43 pm End of shift, patient hand off to [employer], PA-C. Waiting for cardiology to advise if they will admit or if we need to have medicine admit.   Linus Mako,  PA-C 05/19/13 1944

## 2013-05-19 NOTE — ED Notes (Signed)
Patient transported to CT 

## 2013-05-19 NOTE — Telephone Encounter (Signed)
Larry [name] is a 63 y.o. male with a hx of systolic CHF, NICM, AFib.  He established in the CHF clinic recently. His Toprol was adjusted and metolazone was added to his diuretic regimen for volume excess. Patient has become weak. His wife called the answering service today. He had difficulty walking in the store today. Of note, he did have breakfast this morning that consisted of pancakes, bacon and hash browns. He is not complaining of increased dyspnea. He has been dizzy. He fell earlier today. It is not clear if he lost consciousness. His sugar was in the 300s. Blood pressure was recorded at 80/64. I have recommended that the patient to the emergency room for further evaluation. His wife agrees with this plan. Richardson Dopp, PA-C   05/19/2013 3:15 PM

## 2013-05-19 NOTE — ED Notes (Signed)
Patient transported to X-ray 

## 2013-05-19 NOTE — ED Notes (Signed)
Pt feels he is ready to go to CT.  Called and notified Mickel Baas in [place].

## 2013-05-19 NOTE — ED Notes (Signed)
Pt has felt tired this past week since medication changes and additions.  Onset 2:30p pt got up and when started walking pt lost consciousness and fell on floor, hitting back of head on carpeted floor.  LOC for several seconds.  After LOC wife took BP 95/68, BS 360. At Cardiologist office Tuesday MD added medications: Metolazone  Mg 1/2 tab q Tuesday and Thursday' Klor Kon 10 meq 2 tabs on  Tues and Thur and increased Metropolol 100 mg to 2 tabs q day from 1.5 tabs qd.

## 2013-05-19 NOTE — ED Notes (Signed)
Nurse tech doing orthostatic VS, on the standing VS pt got dizzy, diaphoretic.  Pt back to lying position and after few minutes starts to feel little better.  PA made aware.

## 2013-05-19 NOTE — ED Notes (Signed)
Pt reports syncopal episode around 1430 today. Pt reports that his heart Dr recently changed some of his medications and since he has been experiencing dizziness and lightheadedness upon standing. Pt reports feeling weak this morning when he woke up today. Pt currently AAOx4. Visitor reports that pt had low BP this morning and elevated blood glucose 360 this afternoon around 1500.

## 2013-05-20 DIAGNOSIS — I4891 Unspecified atrial fibrillation: Secondary | ICD-10-CM

## 2013-05-20 DIAGNOSIS — I5022 Chronic systolic (congestive) heart failure: Secondary | ICD-10-CM

## 2013-05-20 DIAGNOSIS — N183 Chronic kidney disease, stage 3 unspecified: Secondary | ICD-10-CM

## 2013-05-20 DIAGNOSIS — N289 Disorder of kidney and ureter, unspecified: Secondary | ICD-10-CM

## 2013-05-20 LAB — GLUCOSE, CAPILLARY
Glucose-Capillary: 196 mg/dL — ABNORMAL HIGH (ref 70–99)
Glucose-Capillary: 197 mg/dL — ABNORMAL HIGH (ref 70–99)
Glucose-Capillary: 205 mg/dL — ABNORMAL HIGH (ref 70–99)
Glucose-Capillary: 182 mg/dL — ABNORMAL HIGH (ref 70–99)

## 2013-05-20 LAB — CBC
HCT: 43.1 % (ref 39.0–52.0)
Hemoglobin: 15.2 g/dL (ref 13.0–17.0)
MCH: 33.7 pg (ref 26.0–34.0)
MCHC: 35.3 g/dL (ref 30.0–36.0)
MCV: 95.6 fL (ref 78.0–100.0)
PLATELETS: 195 10*3/uL (ref 150–400)
RBC: 4.51 MIL/uL (ref 4.22–5.81)
RDW: 12.8 % (ref 11.5–15.5)
WBC: 9.4 10*3/uL (ref 4.0–10.5)

## 2013-05-20 LAB — BASIC METABOLIC PANEL
BUN: 81 mg/dL — AB (ref 6–23)
CALCIUM: 9 mg/dL (ref 8.4–10.5)
CO2: 24 mEq/L (ref 19–32)
Chloride: 95 mEq/L — ABNORMAL LOW (ref 96–112)
Creatinine, Ser: 2.67 mg/dL — ABNORMAL HIGH (ref 0.50–1.35)
GFR calc Af Amer: 28 mL/min — ABNORMAL LOW (ref >90)
GFR, EST NON AFRICAN AMERICAN: 24 mL/min — AB (ref >90)
Glucose, Bld: 206 mg/dL — ABNORMAL HIGH (ref 70–99)
Potassium: 3.9 mEq/L (ref 3.7–5.3)
Sodium: 137 mEq/L (ref 137–147)

## 2013-05-20 NOTE — Progress Notes (Signed)
UR Completed.  Larry [name] [id number] 05/20/2013

## 2013-05-20 NOTE — Progress Notes (Signed)
Subjective: No complaints other than back pain.   Objective: Vital signs in last 24 hours: Temp:  [97.3 F (36.3 C)-98.3 F (36.8 C)] 98 F (36.7 C) (04/12 0500) Pulse Rate:  [58-79] 78 (04/12 1026) Resp:  [7-19] 16 (04/12 0500) BP: (80-131)/(54-81) 107/69 mmHg (04/12 1026) SpO2:  [94 %-99 %] 95 % (04/12 0500) Weight:  [245 lb (111.131 kg)-250 lb (113.399 kg)] 245 lb (111.131 kg) (04/12 0500)    Intake/Output from previous day: 04/11 0701 - 04/12 0700 In: 1360 [P.O.:360; I.V.:1000] Out: 1700 [Urine:1700] Intake/Output this shift:   Medications . amLODipine  5 mg Oral Daily  . atorvastatin  40 mg Oral q1800  . DULoxetine  30 mg Oral TID  . insulin aspart  0-5 Units Subcutaneous QHS  . insulin aspart  0-9 Units Subcutaneous TID WC  . metoprolol succinate  100 mg Oral BID  . multivitamin with minerals  1 tablet Oral Daily  . Rivaroxaban  15 mg Oral Q supper   PE: General appearance: alert, cooperative and no distress Lungs: clear to auscultation bilaterally Heart: regular rate and rhythm, S1, S2 normal, no murmur, click, rub or gallop Abdomen: +BS nontender Extremities: No LEE Pulses: 2+ and symmetric Skin: Warm and dry Neurologic: Grossly normal  Lab Results:   Recent Labs  05/19/13 1623 05/20/13 0550  WBC 8.7 9.4  HGB 16.5 15.2  HCT 46.0 43.1  PLT 232 195   BMET  Recent Labs  05/19/13 1623 05/20/13 0550  NA 135* 137  K 3.6* 3.9  CL 90* 95*  CO2 22 24  GLUCOSE 202* 206*  BUN 76* 81*  CREATININE 3.28* 2.67*  CALCIUM 9.4 9.0   PT/INR  Recent Labs  05/19/13 1646  LABPROT 18.2*  INR 1.55*   Echo: 05/02/2013 Study Conclusions  - Left ventricle: The cavity size was moderately dilated. Wall thickness was increased in a pattern of mild LVH. Systolic function was severely reduced. The estimated ejection fraction was in the range of 25% to 30%. Diffuse hypokinesis. Features are consistent with a pseudonormal left ventricular filling pattern,  with concomitant abnormal relaxation and increased filling pressure (grade 2 diastolic dysfunction). E/medial e' > 15 suggests LV end diastolic pressure at least 20 mmHg. - Aortic valve: There was no stenosis. Trivial regurgitation. - Aorta: Aortic root dimension:60mm (ED). - Mitral valve: Mildly calcified annulus. Mildly calcified leaflets . Trivial regurgitation. - Left atrium: The atrium was moderately dilated. - Right ventricle: The cavity size was normal. Pacer wire or catheter noted in right ventricle. Systolic function was mildly reduced. - Right atrium: The atrium was mildly dilated. - Pulmonary arteries: No complete TR doppler jet so unable to estimate PA systolic pressure. - Inferior vena cava: The vessel was normal in size; the respirophasic diameter changes were in the normal range (= 50%); findings are consistent with normal central venous pressure. Impressions:  - Moderately dilated LV with mild LV hypertrophy. Global hypokinesis with EF 25-30%. Moderate diastolic dysfunction with evidence for elevated LV filling pressure. Normal RV size with mildly decreased systolic function. No significant valvular abnormalities.     Assessment/Plan  "Larry [name] is a 63 y.o. male with a hx of systolic CHF, NICM, AFib. He established in the CHF clinic recently. His Toprol was adjusted and metolazone was added to his diuretic regimen for volume excess. Patient has become weak. His wife called the answering service today. He had difficulty walking in the store today. Of note, he did have breakfast this morning that  consisted of pancakes, bacon and hash browns. He is not complaining of increased dyspnea. He has been dizzy. He fell earlier today. It is not clear if he lost consciousness. His sugar was in the 300s. Blood pressure was recorded at 80/64. I have recommended that the patient to the emergency room for further evaluation. His wife agrees with this plan."   Active  Problems:   Syncope and collapse   Hypotension   Acute on Chronic Kidney injury   Hypokalemia   DM   Plan:  Syncope likely from orthostatic hypotension.  Feels better.  Holding diuretics, ARB and aldactone in face of AKI, hypotension.  Recheck orthostatic vitals now.   Xarelto was decreased to 15mg  due to AKI.  As kidney function improves will need to readjust.  SCr has improved with IV fluids.  3.28>>2.67.   BP improved.  Getting amlodipine 5 and Toprol 100 bid.  K+ improved.  CT head negative for acute changes.   SS insulin.  We discussed daily weight monitoring.  Will need to readjust diuretic therapy at discharge.   Strict I/O.     LOS: 1 day   Tarri Fuller PA-C 05/20/2013 11:44 AM  The patient was seen, examined and discussed with Tarri Fuller, PA-C and I agree with the above.   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 He has acute on chronic CKD due to dehydration with some improvement with overnight hydration 3.28  --> 2.67 (baseline on 04/13/13 1.5). We will continue careful hydration and follow crea.   Dorothy Spark 05/20/2013

## 2013-05-20 NOTE — Discharge Instructions (Addendum)
Lab work on Friday, 04/17, BMET and BNP. Scheduled at [facility], [address], [address]. Come anytime, earlier is better, do not have to be fasting. Can take all usual morning medications.  Information on my medicine - XARELTO (Rivaroxaban)  This medication education was reviewed with me or my healthcare representative as part of my discharge preparation.  The pharmacist that spoke with me during my hospital stay was:  Jefferson, RPH  Why was Xarelto prescribed for you? Xarelto was prescribed for you to reduce the risk of a blood clot forming that can cause a stroke if you have a medical condition called atrial fibrillation (a type of irregular heartbeat).  What do you need to know about xarelto ? Take your Xarelto ONCE DAILY at the same time every day with your evening meal. If you have difficulty swallowing the tablet whole, you may crush it and mix in applesauce just prior to taking your dose.  Take Xarelto exactly as prescribed by your doctor and DO NOT stop taking Xarelto without talking to the doctor who prescribed the medication.  Stopping without other stroke prevention medication to take the place of Xarelto may increase your risk of developing a clot that causes a stroke.  Refill your prescription before you run out.  After discharge, you should have regular check-up appointments with your healthcare provider that is prescribing your Xarelto.  In the future your dose may need to be changed if your kidney function or weight changes by a significant amount.  What do you do if you miss a dose? If you are taking Xarelto ONCE DAILY and you miss a dose, take it as soon as you remember on the same day then continue your regularly scheduled once daily regimen the next day. Do not take two doses of Xarelto at the same time or on the same day.   Important Safety Information A possible side effect of Xarelto is bleeding. You should call your healthcare provider right away  if you experience any of the following:   Bleeding from an injury or your nose that does not stop.   Unusual colored urine (red or dark brown) or unusual colored stools (red or black).   Unusual bruising for unknown reasons.   A serious fall or if you hit your head (even if there is no bleeding).  Some medicines may interact with Xarelto and might increase your risk of bleeding while on Xarelto. To help avoid this, consult your healthcare provider or pharmacist prior to using any new prescription or non-prescription medications, including herbals, vitamins, non-steroidal anti-inflammatory drugs (NSAIDs) and supplements.  This website has more information on Xarelto: [email or web address]

## 2013-05-20 NOTE — ED Provider Notes (Signed)
Medical screening examination/treatment/procedure(s) were performed by non-physician practitioner and as supervising physician I was immediately available for consultation/collaboration.   EKG Interpretation   Date/Time:  Saturday May 19 2013 19:09:00 EDT Ventricular Rate:  61 PR Interval:  293 QRS Duration: 113 QT Interval:  574 QTC Calculation: 578 R Axis:   -134 Text Interpretation:  Sinus rhythm Ventricular premature complex Prolonged  PR interval Extensive anterior infarct, old Prolonged QT interval  Confirmed by Beau Fanny  MD, Rosabelle Jupin ([id number]) on 05/20/2013 10:58:44 AM       Veryl Speak, MD 05/20/13 1059

## 2013-05-21 LAB — GLUCOSE, CAPILLARY
GLUCOSE-CAPILLARY: 178 mg/dL — AB (ref 70–99)
GLUCOSE-CAPILLARY: 189 mg/dL — AB (ref 70–99)
Glucose-Capillary: 213 mg/dL — ABNORMAL HIGH (ref 70–99)
Glucose-Capillary: 291 mg/dL — ABNORMAL HIGH (ref 70–99)

## 2013-05-21 LAB — BASIC METABOLIC PANEL
BUN: 80 mg/dL — ABNORMAL HIGH (ref 6–23)
CALCIUM: 9.6 mg/dL (ref 8.4–10.5)
CO2: 27 meq/L (ref 19–32)
CREATININE: 2.12 mg/dL — AB (ref 0.50–1.35)
Chloride: 94 mEq/L — ABNORMAL LOW (ref 96–112)
GFR calc Af Amer: 37 mL/min — ABNORMAL LOW (ref >90)
GFR calc non Af Amer: 32 mL/min — ABNORMAL LOW (ref >90)
GLUCOSE: 281 mg/dL — AB (ref 70–99)
Potassium: 4.9 mEq/L (ref 3.7–5.3)
Sodium: 137 mEq/L (ref 137–147)

## 2013-05-21 MED ORDER — SODIUM CHLORIDE 0.45 % IV SOLN
INTRAVENOUS | Status: AC
  Administered 2013-05-21: 10:00:00 via INTRAVENOUS

## 2013-05-21 NOTE — Progress Notes (Signed)
Patient ID: Larry [name], male   DOB: [birth year], 63 y.o.   MRN: [id number]   Subjective: No complaints  Wants to go home   Objective: Vital signs in last 24 hours: Temp:  [98 F (36.7 C)-99.1 F (37.3 C)] 98 F (36.7 C) (04/13 0500) Pulse Rate:  [60-78] 70 (04/13 0500) Resp:  [16-18] 16 (04/13 0500) BP: (95-112)/(52-77) 108/75 mmHg (04/13 0500) SpO2:  [95 %-96 %] 96 % (04/13 0500) Weight:  [243 lb (110.224 kg)] 243 lb (110.224 kg) (04/13 0500)    Intake/Output from previous day: 04/12 0701 - 04/13 0700 In: 480 [P.O.:480] Out: 850 [Urine:850] Intake/Output this shift:   Medications . amLODipine  5 mg Oral Daily  . atorvastatin  40 mg Oral q1800  . DULoxetine  30 mg Oral TID  . insulin aspart  0-5 Units Subcutaneous QHS  . insulin aspart  0-9 Units Subcutaneous TID WC  . metoprolol succinate  100 mg Oral BID  . multivitamin with minerals  1 tablet Oral Daily  . Rivaroxaban  15 mg Oral Q supper   PE: General appearance: alert, cooperative and no distress Lungs: clear to auscultation bilaterally Heart: regular rate and rhythm, S1, S2 normal, no murmur, click, rub or gallop Abdomen: +BS nontender Extremities: No LEE Pulses: 2+ and symmetric Skin: Warm and dry Neurologic: Grossly normal  Lab Results:   Recent Labs  05/19/13 1623 05/20/13 0550  WBC 8.7 9.4  HGB 16.5 15.2  HCT 46.0 43.1  PLT 232 195   BMET  Recent Labs  05/19/13 1623 05/20/13 0550  NA 135* 137  K 3.6* 3.9  CL 90* 95*  CO2 22 24  GLUCOSE 202* 206*  BUN 76* 81*  CREATININE 3.28* 2.67*  CALCIUM 9.4 9.0   PT/INR  Recent Labs  05/19/13 1646  LABPROT 18.2*  INR 1.55*   Echo: 05/02/2013 Study Conclusions  - Left ventricle: The cavity size was moderately dilated. Wall thickness was increased in a pattern of mild LVH. Systolic function was severely reduced. The estimated ejection fraction was in the range of 25% to 30%. Diffuse hypokinesis. Features are consistent with a  pseudonormal left ventricular filling pattern, with concomitant abnormal relaxation and increased filling pressure (grade 2 diastolic dysfunction). E/medial e' > 15 suggests LV end diastolic pressure at least 20 mmHg. - Aortic valve: There was no stenosis. Trivial regurgitation. - Aorta: Aortic root dimension:49mm (ED). - Mitral valve: Mildly calcified annulus. Mildly calcified leaflets . Trivial regurgitation. - Left atrium: The atrium was moderately dilated. - Right ventricle: The cavity size was normal. Pacer wire or catheter noted in right ventricle. Systolic function was mildly reduced. - Right atrium: The atrium was mildly dilated. - Pulmonary arteries: No complete TR doppler jet so unable to estimate PA systolic pressure. - Inferior vena cava: The vessel was normal in size; the respirophasic diameter changes were in the normal range (= 50%); findings are consistent with normal central venous pressure. Impressions:  - Moderately dilated LV with mild LV hypertrophy. Global hypokinesis with EF 25-30%. Moderate diastolic dysfunction with evidence for elevated LV filling pressure. Normal RV size with mildly decreased systolic function. No significant valvular abnormalities.     Assessment/Plan  "Larry [name] is a 63 y.o. male with a hx of systolic CHF, NICM, AFib. He established in the CHF clinic recently. His Toprol was adjusted and metolazone was added to his diuretic regimen for volume excess. Patient has become weak. His wife called the answering service today. He had difficulty  walking in the store today. Of note, he did have breakfast this morning that consisted of pancakes, bacon and hash browns. He is not complaining of increased dyspnea. He has been dizzy. He fell earlier today. It is not clear if he lost consciousness. His sugar was in the 300s. Blood pressure was recorded at 80/64. I have recommended that the patient to the emergency room for further evaluation. His  wife agrees with this plan."   Active Problems:   Syncope and collapse   Hypotension   Acute on Chronic Kidney injury   Hypokalemia   DM   Plan:  Losartan, and both diuretics still held  Will hydrate for 12 hours today and if Cr less than 2 d/c in am  Will likely d/c with ARB and slowly add back to normal lasix dose with no zaroxyln

## 2013-05-21 NOTE — Progress Notes (Signed)
Inpatient Diabetes Program Recommendations  AACE/ADA: New Consensus Statement on Inpatient Glycemic Control (2013)  Target Ranges:  Prepandial:   less than 140 mg/dL      Peak postprandial:   less than 180 mg/dL (1-2 hours)      Critically ill patients:  140 - 180 mg/dL     Results for Larry [name], Larry [name] (MRN [id number]) as of 05/21/2013 08:23  Ref. Range 05/20/2013 07:53 05/20/2013 11:45 05/20/2013 16:44 05/20/2013 21:20  Glucose-Capillary Latest Range: 70-99 mg/dL 196 (H) 197 (H) 205 (H) 182 (H)    Results for Larry [name], Larry [name] (MRN [id number]) as of 05/21/2013 08:23  Ref. Range 05/21/2013 07:36  Glucose-Capillary Latest Range: 70-99 mg/dL 178 (H)     Admitted with syncope and hypotension.  Has history of DM2, CKD3, CVA, CHF.  Home DM meds: Humalog 30 units daily + Invokana 300 mg daily   MD- Please consider increasing Novolog SSI to Moderate scale tid ac + HS (curently getting Novolog Sensitive SSI)   Will follow Wyn Quaker RN, MSN, CDE Diabetes Coordinator Inpatient Diabetes Program Team Pager: [phone] (8a-10p)

## 2013-05-22 ENCOUNTER — Other Ambulatory Visit: Payer: Self-pay | Admitting: Physician Assistant

## 2013-05-22 DIAGNOSIS — N189 Chronic kidney disease, unspecified: Secondary | ICD-10-CM

## 2013-05-22 DIAGNOSIS — E119 Type 2 diabetes mellitus without complications: Secondary | ICD-10-CM | POA: Diagnosis present

## 2013-05-22 LAB — BASIC METABOLIC PANEL
BUN: 79 mg/dL — ABNORMAL HIGH (ref 6–23)
CO2: 26 meq/L (ref 19–32)
Calcium: 9.5 mg/dL (ref 8.4–10.5)
Chloride: 93 mEq/L — ABNORMAL LOW (ref 96–112)
Creatinine, Ser: 1.94 mg/dL — ABNORMAL HIGH (ref 0.50–1.35)
GFR calc non Af Amer: 36 mL/min — ABNORMAL LOW (ref >90)
GFR, EST AFRICAN AMERICAN: 42 mL/min — AB (ref >90)
Glucose, Bld: 290 mg/dL — ABNORMAL HIGH (ref 70–99)
POTASSIUM: 4.8 meq/L (ref 3.7–5.3)
SODIUM: 133 meq/L — AB (ref 137–147)

## 2013-05-22 LAB — GLUCOSE, CAPILLARY
Glucose-Capillary: 253 mg/dL — ABNORMAL HIGH (ref 70–99)
Glucose-Capillary: 243 mg/dL — ABNORMAL HIGH (ref 70–99)

## 2013-05-22 MED ORDER — VALSARTAN 160 MG PO TABS: 160.0000 mg | ORAL_TABLET | Freq: Every day | ORAL | Status: DC

## 2013-05-22 MED ORDER — VALSARTAN 160 MG PO TABS: 160.0000 mg | ORAL_TABLET | ORAL | Status: DC

## 2013-05-22 MED ORDER — RIVAROXABAN 15 MG PO TABS: 15.0000 mg | ORAL_TABLET | Freq: Every day | ORAL | Status: DC

## 2013-05-22 NOTE — Progress Notes (Signed)
D/c orders received;IV removed with gauze on, pt remains in stable condition, pt meds and instructions reviewed and given to pt; pt d/c to home 

## 2013-05-22 NOTE — Progress Notes (Signed)
Patient ID: Larry [name], male   DOB: [birth year], 63 y.o.   MRN: [id number]   Subjective: No complaints  Wants to go home   Objective: Vital signs in last 24 hours: Temp:  [98.2 F (36.8 C)-98.3 F (36.8 C)] 98.2 F (36.8 C) (04/14 0500) Pulse Rate:  [63-92] 92 (04/14 0500) Resp:  [16-17] 16 (04/14 0500) BP: (108-120)/(62-85) 114/85 mmHg (04/14 0500) SpO2:  [96 %-98 %] 98 % (04/14 0500) Weight:  [245 lb (111.131 kg)] 245 lb (111.131 kg) (04/14 0500) Last BM Date: 05/22/13  Intake/Output from previous day: 04/13 0701 - 04/14 0700 In: 710 [P.O.:710] Out: -  Intake/Output this shift:   Medications . amLODipine  5 mg Oral Daily  . atorvastatin  40 mg Oral q1800  . DULoxetine  30 mg Oral TID  . insulin aspart  0-5 Units Subcutaneous QHS  . insulin aspart  0-9 Units Subcutaneous TID WC  . metoprolol succinate  100 mg Oral BID  . multivitamin with minerals  1 tablet Oral Daily  . rivaroxaban  15 mg Oral Q supper   PE: General appearance: alert, cooperative and no distress Lungs: clear to auscultation bilaterally Heart: regular rate and rhythm, S1, S2 normal, no murmur, click, rub or gallop Abdomen: +BS nontender Extremities: No LEE Pulses: 2+ and symmetric Skin: Warm and dry Neurologic: Grossly normal  Lab Results:   Recent Labs  05/19/13 1623 05/20/13 0550  WBC 8.7 9.4  HGB 16.5 15.2  HCT 46.0 43.1  PLT 232 195   BMET  Recent Labs  05/19/13 1623 05/20/13 0550 05/21/13 0946  NA 135* 137 137  K 3.6* 3.9 4.9  CL 90* 95* 94*  CO2 22 24 27   GLUCOSE 202* 206* 281*  BUN 76* 81* 80*  CREATININE 3.28* 2.67* 2.12*  CALCIUM 9.4 9.0 9.6   PT/INR  Recent Labs  05/19/13 1646  LABPROT 18.2*  INR 1.55*   Echo: 05/02/2013 Study Conclusions  - Left ventricle: The cavity size was moderately dilated. Wall thickness was increased in a pattern of mild LVH. Systolic function was severely reduced. The estimated ejection fraction was in the range of 25% to 30%.  Diffuse hypokinesis. Features are consistent with a pseudonormal left ventricular filling pattern, with concomitant abnormal relaxation and increased filling pressure (grade 2 diastolic dysfunction). E/medial e' > 15 suggests LV end diastolic pressure at least 20 mmHg. - Aortic valve: There was no stenosis. Trivial regurgitation. - Aorta: Aortic root dimension:34mm (ED). - Mitral valve: Mildly calcified annulus. Mildly calcified leaflets . Trivial regurgitation. - Left atrium: The atrium was moderately dilated. - Right ventricle: The cavity size was normal. Pacer wire or catheter noted in right ventricle. Systolic function was mildly reduced. - Right atrium: The atrium was mildly dilated. - Pulmonary arteries: No complete TR doppler jet so unable to estimate PA systolic pressure. - Inferior vena cava: The vessel was normal in size; the respirophasic diameter changes were in the normal range (= 50%); findings are consistent with normal central venous pressure. Impressions:  - Moderately dilated LV with mild LV hypertrophy. Global hypokinesis with EF 25-30%. Moderate diastolic dysfunction with evidence for elevated LV filling pressure. Normal RV size with mildly decreased systolic function. No significant valvular abnormalities.     Assessment/Plan  "Larry [name] is a 63 y.o. male with a hx of systolic CHF, NICM, AFib. He established in the CHF clinic recently. His Toprol was adjusted and metolazone was added to his diuretic regimen for volume excess. Patient has  become weak. His wife called the answering service today. He had difficulty walking in the store today. Of note, he did have breakfast this morning that consisted of pancakes, bacon and hash browns. He is not complaining of increased dyspnea. He has been dizzy. He fell earlier today. It is not clear if he lost consciousness. His sugar was in the 300s. Blood pressure was recorded at 80/64. I have recommended that the  patient to the emergency room for further evaluation. His wife agrees with this plan."   Active Problems:   Syncope and collapse   Hypotension   Acute on Chronic Kidney injury   Hypokalemia   DM   Plan:  BMET pending but should be fine with the bit of hydration he got yesterday  D/C home on previous dose of lasix 80 bid  Aldactone 25 qod and diovan 160 daily No zaroxyln F/U BMET and BNP next week.  And JV next available

## 2013-05-22 NOTE — Discharge Summary (Signed)
CARDIOLOGY DISCHARGE SUMMARY   Patient ID: Larry [name] MRN: [id number] DOB/AGE: 04/30/1950 63 y.o.  Admit date: 05/19/2013 Discharge date: 05/22/2013  PCP: [name], MD Primary Cardiologist: JV/DM   Primary Discharge Diagnosis:   Syncope and collapse - possibly orthostatic in nature secondary to dehydration  Secondary Discharge Diagnosis:    Essential hypertension, benign   Acute on chronic kidney disease, secondary to dehydration   CKD (chronic kidney disease) stage 3, GFR 30-59 ml/min   Type II or unspecified type diabetes mellitus with neurological manifestations, uncontrolled  Procedures: CT of the head without contrast  Hospital Course: Larry [name] is a 63 y.o. male with a history of CAD, ICM, CHF. He had recently been placed on Toprol and had metolazone added when necessary for volume overload. He developed weakness and fell, possibly with a loss of consciousness. He was transported to the emergency room with a systolic blood pressure in the 80s and a blood sugar in the 300s. He was admitted for further evaluation and treatment.  Orthostatics were performed and were positive. His BUN and creatinine were higher than usual. He was hydrated gently and his blood pressure improved. His BUN and creatinine improved slightly also. This will have to be followed closely as an outpatient. His blood sugars were controlled with combination of home medications and sliding scale insulin. He will be encouraged to stick more closely to a low sodium diabetic diet. He admits to poor dietary choices and agreed to attend outpatient DM classes.   His Lasix, Aldactone, Diovan and Zaroxolyn were all held on admission. It was decided to take him off the Zaroxolyn completely at this time. He was taking potassium with the Zaroxolyn and it was discontinued as well. He was felt able to continue on the beta blocker, Lasix and other home medications.  On 4/14, he was seen by Dr. Johnsie Cancel in  all data were reviewed. Final medication adjustments were made and no further inpatient workup was indicated. He is considered stable for discharge, to follow up closely with laboratory later this week and then see Dr. Beau Fanny.  Labs:   Lab Results  Component Value Date   WBC 9.4 05/20/2013   HGB 15.2 05/20/2013   HCT 43.1 05/20/2013   MCV 95.6 05/20/2013   PLT 195 05/20/2013    Recent Labs Lab 05/19/13 1646  05/22/13 0845  NA  --   < > 133*  K  --   < > 4.8  CL  --   < > 93*  CO2  --   < > 26  BUN  --   < > 79*  CREATININE  --   < > 1.94*  CALCIUM  --   < > 9.5  PROT 7.9  --   --   BILITOT 0.4  --   --   ALKPHOS 80  --   --   ALT 17  --   --   AST 14  --   --   GLUCOSE  --   < > 290*  < > = values in this interval not displayed.   Recent Labs  05/19/13 1646  INR 1.55*    Radiology: Dg Chest 2 View 05/19/2013   CLINICAL DATA:  Syncope today  EXAM: CHEST  2 VIEW  COMPARISON:  DG CHEST 2V dated 02/15/2012  FINDINGS: Stable mild cardiac enlargement. Two lead pacer stable. Vascular pattern normal. Lungs clear.  IMPRESSION: No active cardiopulmonary disease.   Electronically Signed  By: Skipper Cliche M.D.   On: 05/19/2013 18:02   Ct Head Wo Contrast 05/19/2013   CLINICAL DATA:  Syncope, head injury, + Xeralto  EXAM: CT HEAD WITHOUT CONTRAST  TECHNIQUE: Contiguous axial images were obtained from the base of the skull through the vertex without intravenous contrast.  COMPARISON:  CT C SPINE W/CM dated 09/13/2011; CT C SPINE W/CM dated 01/27/2011; MR HEAD WO/W CM dated 12/19/2007  FINDINGS: There is no evidence of acute hemorrhage, mass effect nor hydrocephalus. An area of encephalomalacic change within the right posterior parietal region consistent with an area of chronic MCA distribution infarction. There are areas of mild low attenuation within the subcortical, deep, and periventricular white matter regions consistent with mild small vessel white matter ischaemia. The patient is  pituitary adenoma is again appreciated.  The calvarium is unremarkable.  The visualized paranasal sinuses and mastoid air cells are patent.  IMPRESSION: Chronic and involutional changes without evidence of acute abnormalities.   Electronically Signed   By: Margaree Mackintosh M.D.   On: 05/19/2013 19:53   EKG: 19-May-2013 19:09:00   Sinus rhythm Ventricular premature complex Prolonged PR interval Extensive anterior infarct, old Prolonged QT interval Vent. rate 61 BPM PR interval 293 ms QRS duration 113 ms QT/QTc 574/578 ms P-R-T axes 50 226 18  FOLLOW UP PLANS AND APPOINTMENTS Allergies  Allergen Reactions  . Bydureon [Exenatide] Other (See Comments)    sweating  . Losartan Potassium Other (See Comments)    insomnia     Medication List    STOP taking these medications       metolazone 5 MG tablet  Commonly known as:  ZAROXOLYN     potassium chloride 10 MEQ tablet  Commonly known as:  K-DUR      TAKE these medications       ALPRAZolam 0.5 MG tablet  Commonly known as:  XANAX  Take 0.5 mg by mouth 3 (three) times daily as needed for anxiety.     amLODipine-atorvastatin 5-40 MG per tablet  Commonly known as:  CADUET  Take 1 tablet by mouth daily.     DULoxetine 30 MG capsule  Commonly known as:  CYMBALTA  Take 30 mg by mouth 3 (three) times daily.     furosemide 80 MG tablet  Commonly known as:  LASIX  Take 80 mg by mouth 2 (two) times daily.     insulin lispro 100 UNIT/ML injection  Commonly known as:  HUMALOG  Inject 30 Units into the skin daily.     INVOKANA 300 MG Tabs  Generic drug:  Canagliflozin  Take 300 mg by mouth every morning.     metoprolol succinate 100 MG 24 hr tablet  Commonly known as:  TOPROL-XL  Take 1 tablet (100 mg total) by mouth 2 (two) times daily.     multivitamin with minerals Tabs tablet  Take 1 tablet by mouth daily.     oxyCODONE-acetaminophen 10-325 MG per tablet  Commonly known as:  PERCOCET  Take 1 tablet by mouth every 4  (four) hours as needed. For pain     Rivaroxaban 15 MG Tabs tablet  Commonly known as:  XARELTO  Take 1 tablet (15 mg total) by mouth daily with supper.     spironolactone 25 MG tablet  Commonly known as:  ALDACTONE  Take 25 mg by mouth every other day.     valsartan 160 MG tablet  Commonly known as:  DIOVAN  Take 1 tablet (160 mg total) by  mouth daily.     zolpidem 10 MG tablet  Commonly known as:  AMBIEN  Take 10 mg by mouth at bedtime as needed. For sleep        Discharge Orders   Future Appointments Provider Department Dept Phone   05/28/2013 11:25 AM [facility] [place] [phone]   05/31/2013 9:40 AM [place] [phone]   06/04/2013 8:30 AM [facility] [place] CARDIOPULMONARY [phone]   06/07/2013 9:30 AM Jettie Booze, MD [facility] [phone]   Future Orders Complete By Expires   (HEART FAILURE PATIENTS) Call MD:  Anytime you have any of the following symptoms: 1) 3 pound weight gain in 24 hours or 5 pounds in 1 week 2) shortness of breath, with or without a dry hacking cough 3) swelling in the hands, feet or stomach 4) if you have to sleep on extra pillows at night in order to breathe.  As directed    Diet - low sodium heart healthy  As directed    Diet Carb Modified  As directed    Increase activity slowly  As directed      Follow-up Information   Follow up with Jettie Booze., MD On 06/07/2013. (at 9:30 am)    Specialty:  Interventional Cardiology   Contact information:   [address] [place] [address] [phone]       Follow up with [facility] On 05/31/2013. (at 9:40 am)    Specialty:  Cardiology   Contact information:   [address] [phone] [place]  [phone]      Follow up with [facility] On 05/28/2013.  (Remote device check at 9:40 am)       Follow up with [facility] On 06/04/2013. (at 8:30 am)       BRING ALL MEDICATIONS WITH YOU TO FOLLOW UP APPOINTMENTS  Time spent with patient to include physician time: 46 min Signed: Lonn Georgia, PA-C 05/22/2013, 11:39 AM [facility] MD

## 2013-05-25 ENCOUNTER — Encounter: Payer: Self-pay | Admitting: Internal Medicine

## 2013-05-25 ENCOUNTER — Other Ambulatory Visit (INDEPENDENT_AMBULATORY_CARE_PROVIDER_SITE_OTHER): Payer: 59

## 2013-05-25 DIAGNOSIS — N189 Chronic kidney disease, unspecified: Secondary | ICD-10-CM

## 2013-05-25 LAB — BASIC METABOLIC PANEL
BUN: 62 mg/dL — ABNORMAL HIGH (ref 6–23)
CALCIUM: 9 mg/dL (ref 8.4–10.5)
CO2: 27 mEq/L (ref 19–32)
Chloride: 95 mEq/L — ABNORMAL LOW (ref 96–112)
Creatinine, Ser: 2.1 mg/dL — ABNORMAL HIGH (ref 0.4–1.5)
GFR: 33.94 mL/min — AB (ref >60.00)
GLUCOSE: 227 mg/dL — AB (ref 70–99)
POTASSIUM: 3.5 meq/L (ref 3.5–5.1)
SODIUM: 133 meq/L — AB (ref 135–145)

## 2013-05-25 LAB — BRAIN NATRIURETIC PEPTIDE: PRO B NATRI PEPTIDE: 96 pg/mL (ref 0.0–100.0)

## 2013-05-28 ENCOUNTER — Ambulatory Visit (INDEPENDENT_AMBULATORY_CARE_PROVIDER_SITE_OTHER): Payer: 59 | Admitting: *Deleted

## 2013-05-28 ENCOUNTER — Encounter (HOSPITAL_COMMUNITY): Payer: 59

## 2013-05-28 ENCOUNTER — Encounter: Payer: Self-pay | Admitting: Internal Medicine

## 2013-05-28 DIAGNOSIS — I5022 Chronic systolic (congestive) heart failure: Secondary | ICD-10-CM

## 2013-05-28 DIAGNOSIS — I428 Other cardiomyopathies: Secondary | ICD-10-CM

## 2013-05-28 LAB — MDC_IDC_ENUM_SESS_TYPE_REMOTE
Battery Remaining Longevity: 59 mo
Brady Statistic AP VP Percent: 7.3 %
Brady Statistic AP VS Percent: 74 %
Brady Statistic AS VP Percent: 1 %
Brady Statistic RA Percent Paced: 81 %
Brady Statistic RV Percent Paced: 7.8 %
HighPow Impedance: 74 Ohm
HighPow Impedance: 74 Ohm
Implantable Pulse Generator Serial Number: 622169
Lead Channel Impedance Value: 450 Ohm
Lead Channel Pacing Threshold Amplitude: 0.75 V
Lead Channel Pacing Threshold Pulse Width: 0.5 ms
Lead Channel Pacing Threshold Pulse Width: 0.5 ms
Lead Channel Sensing Intrinsic Amplitude: 11.3 mV
Lead Channel Setting Pacing Amplitude: 2 V
MDC IDC MSMT BATTERY REMAINING PERCENTAGE: 65 %
MDC IDC MSMT BATTERY VOLTAGE: 2.95 V
MDC IDC MSMT LEADCHNL RA SENSING INTR AMPL: 4.4 mV
MDC IDC MSMT LEADCHNL RV IMPEDANCE VALUE: 340 Ohm
MDC IDC MSMT LEADCHNL RV PACING THRESHOLD AMPLITUDE: 1 V
MDC IDC SESS DTM: 20150420060020
MDC IDC SET LEADCHNL RV PACING AMPLITUDE: 2.5 V
MDC IDC SET LEADCHNL RV PACING PULSEWIDTH: 0.5 ms
MDC IDC SET LEADCHNL RV SENSING SENSITIVITY: 0.5 mV
MDC IDC STAT BRADY AS VS PERCENT: 17 %
Zone Setting Detection Interval: 270 ms
Zone Setting Detection Interval: 335 ms

## 2013-05-31 ENCOUNTER — Ambulatory Visit (HOSPITAL_COMMUNITY)
Admission: RE | Admit: 2013-05-31 | Discharge: 2013-05-31 | Disposition: A | Discharge status: Home / Self Care | Payer: 59 | Source: Ambulatory Visit | Attending: Cardiology | Admitting: Cardiology

## 2013-05-31 ENCOUNTER — Encounter: Payer: Self-pay | Admitting: *Deleted

## 2013-05-31 VITALS — BP 108/64 | HR 68 | Wt 253.0 lb

## 2013-05-31 DIAGNOSIS — I4891 Unspecified atrial fibrillation: Secondary | ICD-10-CM

## 2013-05-31 DIAGNOSIS — N183 Chronic kidney disease, stage 3 unspecified: Secondary | ICD-10-CM

## 2013-05-31 DIAGNOSIS — I5022 Chronic systolic (congestive) heart failure: Secondary | ICD-10-CM

## 2013-05-31 MED ORDER — RIVAROXABAN 15 MG PO TABS: 15.0000 mg | ORAL_TABLET | Freq: Every day | ORAL | Status: DC

## 2013-05-31 MED ORDER — FUROSEMIDE 80 MG PO TABS: ORAL_TABLET | ORAL | Status: DC

## 2013-05-31 NOTE — Patient Instructions (Signed)
Change Xarelto to 15 mg daily  Decrease Furosemide (Lasix) to 80 mg in AM and 40 mg (1/2 tab) in PM  Lab in 1 week with CPX  Your physician recommends that you schedule a follow-up appointment in: 1 month

## 2013-05-31 NOTE — Progress Notes (Signed)
Patient ID: Larry [name], male   DOB: [birth year], 63 y.o.   MRN: [id number] PCP: Dr. Rex Kras Referring MD: Dr. Irish Lack  63 yo with history of chronic systolic CHF (nonischemic cardiomyopathy), CKD, prior CVA, and paroxysmal atrial fibrillation presents for CHF clinic evaluation.  Patient says that he has been known to have a low EF for years.  He has a [employer], political [employer] ICD.  He had LHC in 2009 with nonobstructive disease. Last echo in 3/15 showed moderate LV dilation with EF 25-30%.  At last appointment, he reported NYHA class III symptoms.  Corevue evaluation suggested volume overload.  His medication list indicated that he was taking Lasix 80 mg bid.  Therefore, I started him on metolazone 2.5 mg twice a week.  When he got home, he realized he had only been taking Lasix 80 mg once a day.  He started taking Lasix bid at that time and also started the metolazone.  The combination led to dehydration with orthostatic symptoms and a syncopal episode.  He also had a syncopal episode and developed AKI.  He was admitted, and metolazone was stopped.  He was sent home taking Lasix 80 mg bid.    Patient currently has no dyspnea walking on flat ground.  He is short of breath walking up steps.  No bendopnea, orthopnea, or PND.  No chest pain.  Weight is down 2 lbs.  He has a lot of daytime fatigue and takes frequent naps.  He has OSA but was unable to tolerate Bipap/CPAP.  No tachypalpitations to suggest atrial fibrillation recurrence.    Corevue checked earlier this week did not suggest volume overload.    Labs (2/15): LDL 103 Labs (3/15): K 4.1, creatinine 1.5, BNP 164 Labs (4/15): K 3.5, creatinine 3.3 => 1.9 => 2.1, BNP 96  PMH: 1. Chronic systolic CHF: Nonischemic cardiomyopathy.  Most recent echo in 3/15 with EF 25-30%, moderate LV dilation, mild LVH, moderate diastolic dysfunction, IVC normal, mildly decreased RV systolic function.  Prior echo in 2011 also with EF 25-30%.  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  [employer] ICD.  2. Type II diabetes 3. CKD 4. Monoclonal gammopathy of uncertain etiology.  5. CVA 2007 6. Degenerative disc disease.  7. Pituitary microadenoma: Nonfunctioning, s/p surgery in 2009.  8. OSA: Unable to tolerate Bipap/CPAP. 9. Depression 10. Hypothyroidism 11. Hyperlipidemia 12. Atrial fibrillation: Paroxysmal.  Patient is on Xarelto.   SH: Married, former cigar smoker, unemployed.   FH: Father with MI x 2 and CHF, strong history of CAD on his father's side.   ROS: All systems reviewed and negative except as per HPI.   Current Outpatient Prescriptions  Medication Sig Dispense Refill  . ALPRAZolam (XANAX) 0.5 MG tablet Take 0.5 mg by mouth 3 (three) times daily as needed for anxiety.       [name] amLODipine-atorvastatin (CADUET) 5-40 MG per tablet Take 1 tablet by mouth daily.       . DULoxetine (CYMBALTA) 30 MG capsule Take 60 mg by mouth 2 (two) times daily.       . furosemide (LASIX) 80 MG tablet Take 1 tab in AM and 1/2 tab in PM      . insulin glargine (LANTUS) 100 UNIT/ML injection Inject 70 Units into the skin daily with supper.      . insulin lispro (HUMALOG) 100 UNIT/ML injection Inject 30 Units into the skin daily.      . metoprolol succinate (TOPROL-XL) 100 MG 24 hr tablet Take 1 tab  in AM and 1/2 tab in PM      . Multiple Vitamin (MULITIVITAMIN WITH MINERALS) TABS Take 1 tablet by mouth daily.      [name] oxyCODONE-acetaminophen (PERCOCET) 10-325 MG per tablet Take 1 tablet by mouth every 4 (four) hours as needed. For pain      . rivaroxaban (XARELTO) 15 MG TABS tablet Take 1 tablet (15 mg total) by mouth daily with supper.  30 tablet    . spironolactone (ALDACTONE) 25 MG tablet Take 25 mg by mouth every other day.       . valsartan (DIOVAN) 160 MG tablet Take 1 tablet (160 mg total) by mouth daily.  30 tablet  11  . zolpidem (AMBIEN) 10 MG tablet Take 10 mg by mouth at bedtime as needed. For sleep       No current facility-administered medications for this encounter.     BP 108/64  Pulse 68  Wt 253 lb (114.76 kg)  SpO2 97% General: NAD Neck: Thick, JVP difficult but cannot detect elevation, no thyromegaly or thyroid nodule.  Lungs: Clear to auscultation bilaterally with normal respiratory effort. CV: Nondisplaced PMI.  Heart regular S1/S2, no S3/S4, no murmur.  No peripheral edema.  No carotid bruit.  Normal pedal pulses.  Abdomen: Soft, nontender, no hepatosplenomegaly, no distention.  Skin: Intact without lesions or rashes.  Neurologic: Alert and oriented x 3.  Psych: Normal affect. Extremities: No clubbing or cyanosis.   Assessment/plan: 1. Chronic systolic CHF: Nonischemic cardiomyopathy, known for years.  EF has been stable in the 25-30% range.  NYHA class III symptoms, relatively stable.  Exam and Corevue evaluation do not suggest volume overload.  It is going to be important in the future that we have a good handle on how [name] is taking his medications given recent confusion with Lasix.   - Continue current Toprol XL, valsartan, and spironolactone.  - As creatinine remains up from baseline, will cut back Lasix to 80 qam, and 40 qpm.  BMET in 1 week. - We again discussed trying to cut back on dietary sodium and the need for regular exercise (ideally 30 minute walk 5 days/week).   - He is scheduled for CPX to get objective evaluation of functional status.  He is relatively stable now but may need advanced therapies in the future.  2. CKD: Creatinine worsened in setting of dehydration.  It has improved but is not yet back to baseline. 3. Atrial fibrillation: Paroxysmal.  Continue Xarelto.  As GFR < 50, will cut dose back to 15 mg daily.    [name] 05/31/2013

## 2013-06-04 ENCOUNTER — Ambulatory Visit (HOSPITAL_COMMUNITY): Payer: 59

## 2013-06-04 ENCOUNTER — Ambulatory Visit (HOSPITAL_COMMUNITY)
Admission: RE | Admit: 2013-06-04 | Discharge: 2013-06-04 | Disposition: A | Discharge status: Home / Self Care | Payer: 59 | Source: Ambulatory Visit | Attending: Internal Medicine | Admitting: Internal Medicine

## 2013-06-04 DIAGNOSIS — I5022 Chronic systolic (congestive) heart failure: Secondary | ICD-10-CM

## 2013-06-04 LAB — BASIC METABOLIC PANEL
BUN: 31 mg/dL — ABNORMAL HIGH (ref 6–23)
CO2: 24 mEq/L (ref 19–32)
Calcium: 9.2 mg/dL (ref 8.4–10.5)
Chloride: 93 mEq/L — ABNORMAL LOW (ref 96–112)
Creatinine, Ser: 1.43 mg/dL — ABNORMAL HIGH (ref 0.50–1.35)
GFR calc non Af Amer: 52 mL/min — ABNORMAL LOW (ref >90)
GFR, EST AFRICAN AMERICAN: 60 mL/min — AB (ref >90)
Glucose, Bld: 308 mg/dL — ABNORMAL HIGH (ref 70–99)
Potassium: 4.3 mEq/L (ref 3.7–5.3)
Sodium: 135 mEq/L — ABNORMAL LOW (ref 137–147)

## 2013-06-05 ENCOUNTER — Encounter: Payer: Self-pay | Admitting: *Deleted

## 2013-06-05 DIAGNOSIS — I5022 Chronic systolic (congestive) heart failure: Secondary | ICD-10-CM

## 2013-06-07 ENCOUNTER — Ambulatory Visit (INDEPENDENT_AMBULATORY_CARE_PROVIDER_SITE_OTHER): Payer: 59 | Admitting: Interventional Cardiology

## 2013-06-07 ENCOUNTER — Encounter: Payer: Self-pay | Admitting: Interventional Cardiology

## 2013-06-07 VITALS — BP 120/68 | HR 66 | Ht 70.0 in | Wt 256.0 lb

## 2013-06-07 DIAGNOSIS — I10 Essential (primary) hypertension: Secondary | ICD-10-CM

## 2013-06-07 DIAGNOSIS — I5022 Chronic systolic (congestive) heart failure: Secondary | ICD-10-CM

## 2013-06-07 DIAGNOSIS — I428 Other cardiomyopathies: Secondary | ICD-10-CM

## 2013-06-07 NOTE — Progress Notes (Signed)
Patient ID: Larry [name], male   DOB: [birth year], 63 y.o.   MRN: [id number] Patient ID: Larry [name], male   DOB: 03-Aug-1950, 63 y.o.   MRN: [id number]    [place], [place], [place]  [address] Phone: [phone] Fax:  [phone]  Date:  06/07/2013   ID:  Larry [name], DOB Apr 03, 1950, MRN [id number]  PCP:  [name], MD      History of Present Illness: Larry [name] is a 63 y.o. male who has a nonischemic cardiomyopathy. He has gained weight since his last visit. He has difficultly with his diabetes. He is following up with Dr. Buddy Duty. Diet is better. No bleeding problems.  AICD showed increaesd fluid in January. He has DOE with walking up steps.  He takes his meds well and follows a low salt diet. Unsure why he has gained weight.  He continues to walk regularly.  Cardiomyopathy:  c/o Leg edema more at the end of the day.  Denies : Chest pain.  Shortness of breath.  Dizziness.  Orthopnea.  Paroxysmal nocturnal dyspnea.  Syncope.  walks regularly when weather is good.  He was hospitalized for syncope recently, likely related to dehydration.    Wt Readings from Last 3 Encounters:  06/07/13 256 lb (116.121 kg)  05/31/13 253 lb (114.76 kg)  05/22/13 245 lb (111.131 kg)     Past Medical History  Diagnosis Date  . Cardiomyopathy     nonischemic (EF 25%)  . CAD (coronary artery disease)     nonobstructive CAD by cath 2009  . Obesity   . CVA (cerebral vascular accident)     CVA 2007 without residual deficit  . Pituitary tumor      (nonfunctionging pituitary microadenoma) s/p gamma knife surgery at [facility] 2009 with neuropathy and retinopathy  . Depression   . Erectile dysfunction   . DDD (degenerative disc disease)   . OSA (obstructive sleep apnea)   . Monoclonal gammopathy   . CRI (chronic renal insufficiency)     CRI (baseline creatinine 1.6)  . CKD (chronic kidney disease), stage III     moderate  . Polyneuropathy in diabetes(357.2)    . Hypogonadotropic hypogonadism   . Subclinical hypothyroidism   . Polycythemia, secondary     improved/resolved  . Hypercholesterolemia   . Back pain     F/B [name]  . Monoclonal gammopathy of unknown significance     per [name]  . 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 Neck pain     F/B [name]  . PAF (paroxysmal atrial fibrillation) 06/04/10    Hx of, has device  . History of diverticulitis of colon   . Type II or unspecified type diabetes mellitus with neurological manifestations, uncontrolled 2000  . Nonproliferative diabetic retinopathy PYP(950.93)     Current Outpatient Prescriptions  Medication Sig Dispense Refill  . ALPRAZolam (XANAX) 0.5 MG tablet Take 0.5 mg by mouth 3 (three) times daily as needed for anxiety.       [name] amLODipine-atorvastatin (CADUET) 5-40 MG per tablet Take 1 tablet by mouth daily.       . DULoxetine (CYMBALTA) 30 MG capsule Take 60 mg by mouth 2 (two) times daily.       . furosemide (LASIX) 80 MG tablet Take 1 tab in AM and 1/2 tab in PM      . insulin glargine (LANTUS) 100 UNIT/ML injection  Inject 70 Units into the skin daily with supper.      . insulin lispro (HUMALOG) 100 UNIT/ML injection Inject 30 Units into the skin daily.      . metoprolol succinate (TOPROL-XL) 100 MG 24 hr tablet Take 1 tab in AM and 1/2 tab in PM      . Multiple Vitamin (MULITIVITAMIN WITH MINERALS) TABS Take 1 tablet by mouth daily.      [name] oxyCODONE-acetaminophen (PERCOCET) 10-325 MG per tablet Take 1 tablet by mouth every 4 (four) hours as needed. For pain      . rivaroxaban (XARELTO) 15 MG TABS tablet Take 1 tablet (15 mg total) by mouth daily with supper.  30 tablet    . spironolactone (ALDACTONE) 25 MG tablet Take 25 mg by mouth every other day.       . valsartan (DIOVAN) 160 MG tablet Take 1 tablet (160 mg total) by mouth daily.  30 tablet  11  . zolpidem (AMBIEN) 10 MG tablet Take 10 mg by mouth at bedtime as needed. For  sleep       No current facility-administered medications for this visit.    Allergies:    Allergies  Allergen Reactions  . Bydureon [Exenatide] Other (See Comments)    sweating  . Losartan Potassium Other (See Comments)    insomnia    Social History:  The patient  reports that he quit smoking about 30 years ago. His smoking use included Cigars. He does not have any smokeless tobacco history on file. He reports that he does not drink alcohol or use illicit drugs.   Family History:  The patient's family history includes Cancer in his mother; Dementia in his mother; Depression in his mother; Diabetes in his brother and paternal uncle; Heart disease in his brother and father; Hypertension in his brother, father, and sister; Lung cancer in his father, paternal uncle, and another family member; Obesity in his brother, father, and sister.   ROS:  Please see the history of present illness.  No nausea, vomiting.  No fevers, chills.  No focal weakness.  No dysuria. Obesity, weight gain.  All other systems reviewed and negative.   PHYSICAL EXAM: VS:  BP 120/68  Pulse 66  Ht 5\' 10"  (1.778 m)  Wt 256 lb (116.121 kg)  BMI 36.73 kg/m2 Well nourished, well developed, in no acute distress HEENT: normal Neck: no JVD, no carotid bruits Cardiac:  normal S1, S2; RRR;  Lungs:  clear to auscultation bilaterally, no wheezing, rhonchi or rales Abd: soft, nontender, no hepatomegaly Ext: no edema Skin: warm and dry Neuro:   no focal abnormalities noted  EKG:  Atrial pacemaker, IVCD, poor R wave progression    ASSESSMENT AND PLAN:  Cardiomyopathy  Continue Lasix Tablet, 80 mg, 1 tab, Orally, Twice a day Continue Diovan Tablet, 160 MG, 1 tablet, Orally, Once a day Continue Spironolactone Tablet, 25 MG, 1 tablet, Orally, Once a day Continue Metoprolol Succinate Tablet Extended Release 24 Hour, 100 MG, 1 tab am and 1/2 tab in the pm, Orally, as directed Notes: AICD has been placed because of  cardiomyopathy. Fluid overload by AICD check, followed by the CHF clinic.  Appears euvolemic.  No lightheadedness.   2. Mixed hyperlipidemia  Refill Metoprolol Succinate Tablet Extended Release 24 Hour, 100 MG, 1 tab am and 1/2 tab in the pm, Orally, as directed Continue Caduet Tablet, 5-40 MG, 1 tablet, Orally, Twice a day Notes: LDL 103, TG increased at last check. COntinue caduet. Only  insulin has changed. COnsider fish oil to help to reduce TG.  LDL 103, triglycerides 294 in March 2014. Is being managed by Dr. Buddy Duty.    3. Obesity  Notes: COntinue to try to lose weight. Gained weight since hospital discharge recently. Following diabetic diet will be beneficial for heart and weight loss. He was concerned that the recent weight gain was fluid. He denies any significant lower extremity edema. He has no orthopnea.  He does not feel signfiicant fluid overload.   4. Atrial fibrillation  Notes: Xarelto for anticoagulation to decrease risk of stroke. Need to dose Xarelto based on Creatine clearance.     Preventive Medicine  Adult topics discussed:  Exercise: at least 30 minutes of aerobic exercise, 5 days a week.    Follow  With CHF clinic     Signed, Mina Marble, MD, [facility] 06/07/2013 10:06 AM

## 2013-06-07 NOTE — Patient Instructions (Signed)
Your physician recommends that you follow up with CHF clinic as scheduled.  Your physician recommends that you continue on your current medications as directed. Please refer to the Current Medication list given to you today.

## 2013-06-11 NOTE — Progress Notes (Signed)
ICD remote with ICM 

## 2013-06-13 ENCOUNTER — Other Ambulatory Visit: Payer: Self-pay | Admitting: Interventional Cardiology

## 2013-06-18 ENCOUNTER — Encounter (HOSPITAL_COMMUNITY): Payer: 59

## 2013-06-25 ENCOUNTER — Encounter: Payer: Self-pay | Admitting: Internal Medicine

## 2013-07-04 ENCOUNTER — Encounter (HOSPITAL_COMMUNITY): Payer: Self-pay

## 2013-07-04 ENCOUNTER — Ambulatory Visit (HOSPITAL_COMMUNITY)
Admission: RE | Admit: 2013-07-04 | Discharge: 2013-07-04 | Disposition: A | Discharge status: Home / Self Care | Payer: 59 | Source: Ambulatory Visit | Attending: Internal Medicine | Admitting: Internal Medicine

## 2013-07-04 VITALS — BP 114/68 | HR 98 | Wt 258.5 lb

## 2013-07-04 DIAGNOSIS — E669 Obesity, unspecified: Secondary | ICD-10-CM | POA: Insufficient documentation

## 2013-07-04 DIAGNOSIS — I509 Heart failure, unspecified: Secondary | ICD-10-CM | POA: Insufficient documentation

## 2013-07-04 DIAGNOSIS — N189 Chronic kidney disease, unspecified: Secondary | ICD-10-CM | POA: Insufficient documentation

## 2013-07-04 DIAGNOSIS — Z87891 Personal history of nicotine dependence: Secondary | ICD-10-CM | POA: Insufficient documentation

## 2013-07-04 DIAGNOSIS — Z8673 Personal history of transient ischemic attack (TIA), and cerebral infarction without residual deficits: Secondary | ICD-10-CM | POA: Insufficient documentation

## 2013-07-04 DIAGNOSIS — I5022 Chronic systolic (congestive) heart failure: Secondary | ICD-10-CM | POA: Insufficient documentation

## 2013-07-04 DIAGNOSIS — D472 Monoclonal gammopathy: Secondary | ICD-10-CM | POA: Insufficient documentation

## 2013-07-04 DIAGNOSIS — E039 Hypothyroidism, unspecified: Secondary | ICD-10-CM | POA: Insufficient documentation

## 2013-07-04 DIAGNOSIS — Z7901 Long term (current) use of anticoagulants: Secondary | ICD-10-CM | POA: Insufficient documentation

## 2013-07-04 DIAGNOSIS — F3289 Other specified depressive episodes: Secondary | ICD-10-CM | POA: Insufficient documentation

## 2013-07-04 DIAGNOSIS — E119 Type 2 diabetes mellitus without complications: Secondary | ICD-10-CM | POA: Insufficient documentation

## 2013-07-04 DIAGNOSIS — Z794 Long term (current) use of insulin: Secondary | ICD-10-CM | POA: Insufficient documentation

## 2013-07-04 DIAGNOSIS — I4891 Unspecified atrial fibrillation: Secondary | ICD-10-CM | POA: Insufficient documentation

## 2013-07-04 DIAGNOSIS — I428 Other cardiomyopathies: Secondary | ICD-10-CM | POA: Insufficient documentation

## 2013-07-04 DIAGNOSIS — F329 Major depressive disorder, single episode, unspecified: Secondary | ICD-10-CM | POA: Insufficient documentation

## 2013-07-04 DIAGNOSIS — G4733 Obstructive sleep apnea (adult) (pediatric): Secondary | ICD-10-CM | POA: Insufficient documentation

## 2013-07-04 LAB — BASIC METABOLIC PANEL
BUN: 22 mg/dL (ref 6–23)
CHLORIDE: 96 meq/L (ref 96–112)
CO2: 30 mEq/L (ref 19–32)
Calcium: 9.5 mg/dL (ref 8.4–10.5)
Creatinine, Ser: 1.35 mg/dL (ref 0.50–1.35)
GFR calc non Af Amer: 56 mL/min — ABNORMAL LOW (ref >90)
GFR, EST AFRICAN AMERICAN: 64 mL/min — AB (ref >90)
Glucose, Bld: 287 mg/dL — ABNORMAL HIGH (ref 70–99)
POTASSIUM: 5.1 meq/L (ref 3.7–5.3)
Sodium: 140 mEq/L (ref 137–147)

## 2013-07-04 MED ORDER — METOPROLOL SUCCINATE ER 100 MG PO TB24: 100.0000 mg | ORAL_TABLET | Freq: Two times a day (BID) | ORAL | Status: DC

## 2013-07-04 NOTE — Patient Instructions (Signed)
Increase your Toprol to 100 mg (1 tablet) in the morning and 100 mg (1 tablet) in the evening. Call if you have any dizziness.  If your weight starts trending up take an extra 40 mg of lasix in the afternoon. Call if you are needing to take extra lasix frequently because we will need to check your kidney function.  Try to be as active as possible.  Watch your diet and try to cut back on carbs and watch your portion control  Bring all your medications to all your visits.  Follow up in 1 month  Do the following things EVERYDAY: 1) Weigh yourself in the morning before breakfast. Write it down and keep it in a log. 2) Take your medicines as prescribed 3) Eat low salt foods-Limit salt (sodium) to 2000 mg per day.  4) Stay as active as you can everyday 5) Limit all fluids for the day to less than 2 liters 6)

## 2013-07-04 NOTE — Progress Notes (Signed)
Patient ID: Larry [name], male   DOB: [birth year], 63 y.o.   MRN: [id number] PCP: Dr. Rex Kras Referring MD: Dr. Irish Lack  63 yo with history of chronic systolic CHF (nonischemic cardiomyopathy), CKD, prior CVA, and paroxysmal atrial fibrillation.  Patient says that he has been known to have a low EF for years.  He has a Research officer, political party ICD.  He had LHC in 2009 with nonobstructive disease. Last echo in 3/15 showed moderate LV dilation with EF 25-30%.    CPX 06/05/13: FVC 3.89 (82%), FEV1 2.99 (83%), FEV1/FVC 77%, Peak HR 118 (74% predicted max), Peak VO2 12.4 (51% of predicted; when corrected to IBW =17.7), VE/VCO2 29.9, OUES 1.39, Peak RER 1.21  Follow up for Heart Failure: Overall doing ok. SOB with minimal movement or activity. Not able to go around the whole grocery store without stopping. Can go about 12 steps. Denies PND, orthopnea or CP. Not using Bipap at night. Has not weighed in a couple of weeks. Trying to follow a low salt diet and drinking less than 2L day. Once he was discharged from the hospital was supposed to be on Toprol 100 mg BID, however has been taking 100 mg q am and 50 mg q pm.    Labs (2/15): LDL 103 Labs (3/15): K 4.1, creatinine 1.5, BNP 164 Labs (4/15): K 3.5, creatinine 3.3 => 1.9 => 2.1, BNP 96 Labs (4/15): K 4.3, creatinine 1.43, glucose 308  PMH: 1. Chronic systolic CHF: Nonischemic cardiomyopathy.  Most recent echo in 3/15 with EF 25-30%, moderate LV dilation, mild LVH, moderate diastolic dysfunction, IVC normal, mildly decreased RV systolic function.  Prior echo in 2011 also with EF 25-30%.  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  [employer] Jude ICD. CPX 05/2013: FVC 3.89 (82%), FEV1 2.99 (83%), FEV1/FVC 77%, Peak HR 118 (74% predicted max), Peak VO2 12.4 (51% of predicted; when corrected to IBW =17.7), VE/VCO2 29.9, OUES 1.39, Peak RER 1.21 2. Type II diabetes 3. CKD 4. Monoclonal gammopathy of uncertain etiology.  5. CVA 2007 6. Degenerative disc disease.  7. Pituitary  microadenoma: Nonfunctioning, s/p surgery in 2009.  8. OSA: Unable to tolerate Bipap/CPAP. 9. Depression 10. Hypothyroidism 11. Hyperlipidemia 12. Atrial fibrillation: Paroxysmal.  Patient is on Xarelto.   SH: Married, former cigar smoker, unemployed.   FH: Father with MI x 2 and CHF, strong history of CAD on his father's side.   ROS: All systems reviewed and negative except as per HPI.   Current Outpatient Prescriptions  Medication Sig Dispense Refill  . ALPRAZolam (XANAX) 0.5 MG tablet Take 0.5 mg by mouth 3 (three) times daily as needed for anxiety.       [name] amLODipine-atorvastatin (CADUET) 5-40 MG per tablet Take 1 tablet by mouth daily.       . DULoxetine (CYMBALTA) 30 MG capsule Take 60 mg by mouth 2 (two) times daily.       . furosemide (LASIX) 80 MG tablet Take 1 tab in AM and 1/2 tab in PM      . insulin glargine (LANTUS) 100 UNIT/ML injection Inject 70 Units into the skin daily with supper.      . insulin lispro (HUMALOG) 100 UNIT/ML injection Inject 40 Units into the skin daily.       . metoprolol succinate (TOPROL-XL) 100 MG 24 hr tablet Take 1 tab in AM and 1/2 tab in PM      . Multiple Vitamin (MULITIVITAMIN WITH MINERALS) TABS Take 1 tablet by mouth daily.      [name]  oxyCODONE-acetaminophen (PERCOCET) 10-325 MG per tablet Take 1 tablet by mouth every 4 (four) hours as needed. For pain      . rivaroxaban (XARELTO) 15 MG TABS tablet Take 1 tablet (15 mg total) by mouth daily with supper.  30 tablet    . spironolactone (ALDACTONE) 25 MG tablet Take 25 mg by mouth every other day.      . valsartan (DIOVAN) 160 MG tablet Take 1 tablet (160 mg total) by mouth daily.  30 tablet  11  . zolpidem (AMBIEN) 10 MG tablet Take 10 mg by mouth at bedtime as needed. For sleep       No current facility-administered medications for this encounter.    Filed Vitals:   07/04/13 1036  BP: 114/68  Pulse: 98  Weight: 258 lb 8 oz (117.255 kg)  SpO2: 96%   General: NAD Neck: Thick, JVP  difficult to assess d/t body habitus but appears mildly elevated; no thyromegaly or thyroid nodule.  Lungs: Clear to auscultation bilaterally with normal respiratory effort. CV: Nondisplaced PMI.  Heart regular S1/S2, no S3/S4, no murmur.  No peripheral edema.  No carotid bruit.  Normal pedal pulses.  Abdomen: Soft, tender to palpation, obese, no hepatosplenomegaly, mildly distented  Skin: Intact without lesions or rashes.  Neurologic: Alert and oriented x 3.  Psych: Normal affect. Extremities: No clubbing or cyanosis.   Assessment/plan: 1. Chronic systolic CHF: Nonischemic cardiomyopathy, known for years.  EF has been stable in the 25-30% (04/2013) NYHA class III symptoms and volume status stable. Will continue lasix 80 mg q am and 40 mg q pm. Discussed sliding scale diuretics and the need to weigh daily. If weight starts trending up can take an extra 40 mg of lasix.  - Will increase Toprol XL to 100 mg BID. Continue valsartan and spironolactone at current doses. Can try to increase valsartan next visit. Check BMET today. - Dr. Haroldine Laws discussed CPX test today with patient and wife - Reinforced the need and importance of daily weights, a low sodium diet, and fluid restriction (less than 2 L a day). Instructed to call the HF clinic if weight increases more than 3 lbs overnight or 5 lbs in a week.  2. CKD: Had AKI in 05/2013 d/t dehydration and metolazone was stopped. Last Cr stable will recheck today.  3. Atrial fibrillation: Paroxysmal.  Continue Xarelto. If GFR >50 will increase Xarelto back to 50 mg daily.   4. Obesity: - Discussed to try and cut back on portion control and to follow low carb diet. 5. DM2 - Blood sugars have been elevated and is supposed to see diabetes educator. Continue to watch sugars closely and follow with PCP.    F/U 1 month Rande Brunt NP 07/04/2013  Patient seen and examined with Junie Bame, NP. We discussed all aspects of the encounter. I agree with the  assessment and plan as stated above. Overall doing well. CPX reviewed with him and his wife at length. He has moderate limitation mostly due to his obesity and component of HF. Long talk about need to diet (low carb diet) and lose weight. Agree with increasing Toprol. Consider increasing valsartan to 160/80 at next visit as tolerated.   Total time personally spent 40 minutes. Over half that time spent discussing above.   Shaune Pascal Neyland Pettengill,MD 5:33 PM

## 2013-07-10 ENCOUNTER — Telehealth: Payer: Self-pay | Admitting: Interventional Cardiology

## 2013-07-10 NOTE — Telephone Encounter (Signed)
New message     Faxed clearance for pt to stop xarelto prior to a shot-----he needs to be off xarelto for 48hrs prior to shot.  Is this ok?  Fax to [phone]

## 2013-07-10 NOTE — Telephone Encounter (Signed)
Called Tiffany at [facility] to let them know Dr. Irish Lack is not in clinic today but message request will be forwarded to him.

## 2013-07-11 ENCOUNTER — Encounter: Payer: 59 | Attending: Internal Medicine | Admitting: *Deleted

## 2013-07-11 ENCOUNTER — Encounter: Payer: Self-pay | Admitting: *Deleted

## 2013-07-11 VITALS — Ht 70.0 in | Wt 254.7 lb

## 2013-07-11 DIAGNOSIS — F3289 Other specified depressive episodes: Secondary | ICD-10-CM | POA: Insufficient documentation

## 2013-07-11 DIAGNOSIS — E1149 Type 2 diabetes mellitus with other diabetic neurological complication: Secondary | ICD-10-CM

## 2013-07-11 DIAGNOSIS — F411 Generalized anxiety disorder: Secondary | ICD-10-CM | POA: Insufficient documentation

## 2013-07-11 DIAGNOSIS — E119 Type 2 diabetes mellitus without complications: Secondary | ICD-10-CM | POA: Insufficient documentation

## 2013-07-11 DIAGNOSIS — F329 Major depressive disorder, single episode, unspecified: Secondary | ICD-10-CM | POA: Insufficient documentation

## 2013-07-11 DIAGNOSIS — Z713 Dietary counseling and surveillance: Secondary | ICD-10-CM | POA: Insufficient documentation

## 2013-07-11 NOTE — Telephone Encounter (Signed)
OK to stop Xarelto for 48 hours prior to injection.

## 2013-07-11 NOTE — Progress Notes (Signed)
Appt start time: 1000 end time:  1100.  Unable to have complete session due to depression and anxiety.  Assessment:  Patient was seen on  07/11/13 for individual diabetes education. He presents with his wife. Larry [name] was recently hospitalized with BP and DM concerns. He was referred here "to lose weight" in relationship to DM management  "I Want to try better". He is on medical disability. High Anxiety, panic attacks,  utilizes Xanax PRN and @ HS.The family has experience a High degree of personal loss. They were both tearful as they discussed their losses. 45 minutes into our appointment Larry [name] was feeling anxious and "sweaty". Glucose was 340 mg/dl .  We opened the door, repositioned him to be able to look out the door and he took a Xanax. Within 15 minutes he requested to go home due to his anxiety. He would like to return for a f/u visit soon. Larry [name] does most of the shopping and cooking. Due to his wife's late work schedule to do eat out  a good amount.   Current HbA1c: 7.4%  Per patient.    Preferred Learning Style:   No preference indicated   Learning Readiness:   Contemplating  MEDICATIONS: Lantus, Humalog  DIETARY INTAKE: Incomplete due to shortened session.  B ( AM): [employer] Mc[employer]uffin  Snk ( AM):   L ( PM): cheese burger, fries Snk ( PM):  D ( PM):  Snk ( PM):  Beverages: water, regular coke  Usual physical activity: none, not motivated, lack energy    Intervention:  Nutrition counseling provided.  Discussed diabetes disease process and treatment options.  Discussed physiology of diabetes and role of obesity on insulin resistance.  Encouraged moderate weight reduction to improve glucose levels.  Discussed role of medications and diet in glucose control  Provided education on macronutrients on glucose levels.  Provided education on carb counting, importance of regularly scheduled meals/snacks, and meal planning  Discussed effects of physical activity on glucose levels and  long-term glucose control.  Recommended 150 minutes of physical activity/week.  Reviewed patient medications.  Discussed role of medication on blood glucose and possible side effects  Discussed blood glucose monitoring and interpretation.  Discussed recommended target ranges and individual ranges.    Described short-term complications: hyper- and hypo-glycemia.  Discussed causes,symptoms, and treatment options.  Discussed prevention, detection, and treatment of long-term complications.  Discussed the role of prolonged elevated glucose levels on body systems.  Discussed role of stress on blood glucose levels and discussed strategies to manage psychosocial issues.  Discussed recommendations for long-term diabetes self-care.  Established checklist for medical, dental, and emotional self-care.  Plan:  Aim for 3 Carb Choices per meal (45 grams) +/- 1 either way  Aim for 0-15 Carbs per snack if hungry  Include protein in moderation with your meals and snacks Consider reading food labels for Total Carbohydrate and Fat Grams of foods Consider  increasing your activity level by walking for 10 minutes daily as tolerated Consider checking BG at alternate times per day as directed by MD  Continue taking medication as directed by MD  Walk in the morning for 10 minutes about 2 times per week to gradual increase of 10 minutes 5 times per week. Begin testing glucose 1 time per day and log in book. Consider Fasting and/or 2 hrs after a meal.  Teaching Method Utilized:  Visual Auditory Hands on  Handouts given during visit include: Carb Counting and Food Label handouts Meal Plan Card My Plate Snack Sheet  Glucose log book  Barriers to learning/adherence to lifestyle change: Motivation, depression, anxiety  Diabetes self-care support plan:   [facility] support group  Demonstrated degree of understanding via:  Teach Back   Monitoring/Evaluation:  Dietary intake, exercise, test glucose, and body  weight in 4 week(s).

## 2013-07-11 NOTE — Patient Instructions (Signed)
Plan:  Aim for 3 Carb Choices per meal (45 grams) +/- 1 either way  Aim for 0-15 Carbs per snack if hungry  Include protein in moderation with your meals and snacks Consider reading food labels for Total Carbohydrate and Fat Grams of foods Consider  increasing your activity level by walking for 10 minutes daily as tolerated Consider checking BG at alternate times per day as directed by MD  Continue taking medication as directed by MD  Walk in the morning for 10 minutes about 2 times per week to gradual increase of current of of 10 minutes 5 times per week. Begin testing glucose 1 time per day and log in book. Consider Fasting and/or 2 hrs after a meal.

## 2013-07-12 ENCOUNTER — Other Ambulatory Visit (HOSPITAL_COMMUNITY): Payer: Self-pay

## 2013-07-12 NOTE — Telephone Encounter (Signed)
Lm at [facility].

## 2013-07-12 NOTE — Telephone Encounter (Signed)
Faxed to [facility].

## 2013-07-31 NOTE — Progress Notes (Signed)
Patient ID: Larry [name], male   DOB: Apr 25, 1950, 63 y.o.   MRN: [id number] PCP: Dr. Rex Kras Referring MD: Dr. Irish Lack Endocrinologist: Dr Buddy Duty  63 yo with history of chronic systolic CHF (nonischemic cardiomyopathy), CKD, prior CVA, and paroxysmal atrial fibrillation.  Patient says that he has been known to have a low EF for years.  He has a Research officer, political party ICD.  He had LHC in 2009 with nonobstructive disease. Last echo in 3/15 showed moderate LV dilation with EF 25-30%.    He returns for follow up. Last visit Toprol XL increased to 100 mg bid. Complains of fatigue. Remains SOB with exertion. Occasionally SOB with ADLs. Able  towalk 400 feet. Weight at home 244-250 pounds. Not weighing daily. Appetite ok. Snaking through out the day. Limiting fluid intake to < 2 liters per day. Taking all medications. Has follow up with Dr Buddy Duty next week. Can not use CPAP due to claustrophobia. Disabled since 2007.    CPX 06/05/13: FVC 3.89 (82%), FEV1 2.99 (83%), FEV1/FVC 77%, Peak HR 118 (74% predicted max), Peak VO2 12.4 (51% of predicted; when corrected to IBW =17.7), VE/VCO2 29.9, OUES 1.39, Peak RER 1.21   Labs (2/15): LDL 103 Labs (3/15): K 4.1, creatinine 1.5, BNP 164 Labs (4/15): K 3.5, creatinine 3.3 => 1.9 => 2.1, BNP 96 Labs (4/15): K 4.3, creatinine 1.43, glucose 308 Labs 07/04/13 K 5.1 Creatinine 1.35   PMH: 1. Chronic systolic CHF: Nonischemic cardiomyopathy.  Most recent echo in 3/15 with EF 25-30%, moderate LV dilation, mild LVH, moderate diastolic dysfunction, IVC normal, mildly decreased RV systolic function.  Prior echo in 2011 also with EF 25-30%.  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  St Jude ICD. CPX 05/2013: FVC 3.89 (82%), FEV1 2.99 (83%), FEV1/FVC 77%, Peak HR 118 (74% predicted max), Peak VO2 12.4 (51% of predicted; when corrected to IBW =17.7), VE/VCO2 29.9, OUES 1.39, Peak RER 1.21 2. Type II diabetes 3. CKD 4. Monoclonal gammopathy of uncertain etiology.  5. CVA 2007 6. Degenerative disc  disease.  7. Pituitary microadenoma: Nonfunctioning, s/p surgery in 2009.  8. OSA: Unable to tolerate Bipap/CPAP. 9. Depression 10. Hypothyroidism 11. Hyperlipidemia 12. Atrial fibrillation: Paroxysmal.  Patient is on Xarelto.   SH: Married, former cigar smoker, unemployed.   FH: Father with MI x 2 and CHF, strong history of CAD on his father's side.   ROS: All systems reviewed and negative except as per HPI.   Current Outpatient Prescriptions  Medication Sig Dispense Refill  . ALPRAZolam (XANAX) 0.5 MG tablet Take 0.5 mg by mouth 3 (three) times daily as needed for anxiety.       [name] amLODipine-atorvastatin (CADUET) 5-40 MG per tablet Take 1 tablet by mouth daily.       . DULoxetine (CYMBALTA) 30 MG capsule Take 60 mg by mouth 2 (two) times daily.       . furosemide (LASIX) 80 MG tablet 80 mg 2 (two) times daily.      . insulin glargine (LANTUS) 100 UNIT/ML injection Inject 70 Units into the skin daily with supper.      . insulin lispro (HUMALOG) 100 UNIT/ML injection Inject 40 Units into the skin daily.       . metoprolol succinate (TOPROL-XL) 100 MG 24 hr tablet Take 1 tablet (100 mg total) by mouth 2 (two) times daily.  60 tablet  3  . Multiple Vitamin (MULITIVITAMIN WITH MINERALS) TABS Take 1 tablet by mouth daily.      [name] oxyCODONE-acetaminophen (PERCOCET) 10-325  MG per tablet Take 1 tablet by mouth every 4 (four) hours as needed. For pain      . rivaroxaban (XARELTO) 15 MG TABS tablet Take 1 tablet (15 mg total) by mouth daily with supper.  30 tablet    . spironolactone (ALDACTONE) 25 MG tablet Take 25 mg by mouth every other day.      . valsartan (DIOVAN) 160 MG tablet Take 1 tablet (160 mg total) by mouth daily.  30 tablet  11  . zolpidem (AMBIEN) 10 MG tablet Take 10 mg by mouth at bedtime as needed. For sleep       No current facility-administered medications for this encounter.    Filed Vitals:   08/01/13 1123  BP: 134/87  Pulse: 84  Weight: 254 lb (115.214 kg)  SpO2:  96%   General: NAD Neck: Thick, JVP difficult to assess d/t body habitus but appears mildly elevated; no thyromegaly or thyroid nodule.  Lungs: Clear to auscultation bilaterally with normal respiratory effort. CV: Nondisplaced PMI.  Heart regular S1/S2, no S3/S4, no murmur.  No peripheral edema.  No carotid bruit.  Normal pedal pulses.  Abdomen: Obese, Soft, tender to palpation, obese, no hepatosplenomegaly, non distented  Skin: Intact without lesions or rashes.  Neurologic: Alert and oriented x 3.  Psych: Flat affect. Extremities: No clubbing or cyanosis.   Assessment/plan: 1. Chronic systolic CHF: Nonischemic cardiomyopathy, known for years.  EF has been stable in the 25-30% (04/2013) NYHA class III symptoms and volume status stable. Will continue lasix 80 mg q am and 40 mg q pm. He is currently noncompliant with daily weights.  Encouraged with weigh and record daily.  - Continue Toprol XL to 100 mg BID.  Continue valsartan and spironolactone at current doses will not increase as most recent potassium 5.1  - Reinforced the need and importance of daily weights, a low sodium diet, and fluid restriction (less than 2 L a day). Instructed to call the HF clinic if weight increases more than 3 lbs overnight or 5 lbs in a week.  2. CKD: Had AKI in 05/2013 d/t dehydration and metolazone was stopped. 3. Atrial fibrillation: Paroxysmal.  Continue Xarelto 15 mg daily.  4. Obesity: - Discussed to try and cut back on portion control and to follow low carb diet. Suggested [employer].  5. DM2- has follow up with Dr Buddy Duty  Next week.  6. Depression- referred to HFSW. Has follow up with therapist in the next few weeks.  -   Follow up in 6 weeks and check BMET at that time.  CLEGG,AMY NP-C 08/01/2013

## 2013-08-01 ENCOUNTER — Encounter (HOSPITAL_COMMUNITY): Payer: Self-pay

## 2013-08-01 ENCOUNTER — Ambulatory Visit (HOSPITAL_COMMUNITY)
Admission: RE | Admit: 2013-08-01 | Discharge: 2013-08-01 | Disposition: A | Discharge status: Home / Self Care | Payer: 59 | Source: Ambulatory Visit | Attending: Adult Health | Admitting: Adult Health

## 2013-08-01 ENCOUNTER — Inpatient Hospital Stay (HOSPITAL_BASED_OUTPATIENT_CLINIC_OR_DEPARTMENT_OTHER)
Admission: RE | Admit: 2013-08-01 | Discharge: 2013-08-01 | Disposition: A | Discharge status: Home / Self Care | Payer: 59 | Source: Ambulatory Visit

## 2013-08-01 ENCOUNTER — Encounter: Payer: Self-pay | Admitting: Licensed Clinical Social Worker

## 2013-08-01 VITALS — BP 134/76 | HR 84 | Resp 18 | Wt 254.0 lb

## 2013-08-01 VITALS — BP 134/87 | HR 84 | Wt 254.0 lb

## 2013-08-01 DIAGNOSIS — N183 Chronic kidney disease, stage 3 unspecified: Secondary | ICD-10-CM

## 2013-08-01 DIAGNOSIS — E039 Hypothyroidism, unspecified: Secondary | ICD-10-CM | POA: Insufficient documentation

## 2013-08-01 DIAGNOSIS — N189 Chronic kidney disease, unspecified: Secondary | ICD-10-CM | POA: Insufficient documentation

## 2013-08-01 DIAGNOSIS — E669 Obesity, unspecified: Secondary | ICD-10-CM

## 2013-08-01 DIAGNOSIS — I5022 Chronic systolic (congestive) heart failure: Secondary | ICD-10-CM | POA: Insufficient documentation

## 2013-08-01 DIAGNOSIS — I4891 Unspecified atrial fibrillation: Secondary | ICD-10-CM | POA: Insufficient documentation

## 2013-08-01 DIAGNOSIS — Z7982 Long term (current) use of aspirin: Secondary | ICD-10-CM | POA: Insufficient documentation

## 2013-08-01 DIAGNOSIS — I480 Paroxysmal atrial fibrillation: Secondary | ICD-10-CM

## 2013-08-01 DIAGNOSIS — Z8673 Personal history of transient ischemic attack (TIA), and cerebral infarction without residual deficits: Secondary | ICD-10-CM | POA: Insufficient documentation

## 2013-08-01 DIAGNOSIS — E785 Hyperlipidemia, unspecified: Secondary | ICD-10-CM | POA: Insufficient documentation

## 2013-08-01 DIAGNOSIS — Z87891 Personal history of nicotine dependence: Secondary | ICD-10-CM | POA: Insufficient documentation

## 2013-08-01 DIAGNOSIS — E119 Type 2 diabetes mellitus without complications: Secondary | ICD-10-CM | POA: Insufficient documentation

## 2013-08-01 DIAGNOSIS — F418 Other specified anxiety disorders: Secondary | ICD-10-CM

## 2013-08-01 DIAGNOSIS — I2589 Other forms of chronic ischemic heart disease: Secondary | ICD-10-CM | POA: Insufficient documentation

## 2013-08-01 DIAGNOSIS — E1149 Type 2 diabetes mellitus with other diabetic neurological complication: Secondary | ICD-10-CM

## 2013-08-01 DIAGNOSIS — Z79899 Other long term (current) drug therapy: Secondary | ICD-10-CM | POA: Insufficient documentation

## 2013-08-01 DIAGNOSIS — F341 Dysthymic disorder: Secondary | ICD-10-CM

## 2013-08-01 DIAGNOSIS — I509 Heart failure, unspecified: Secondary | ICD-10-CM | POA: Insufficient documentation

## 2013-08-01 DIAGNOSIS — Z8249 Family history of ischemic heart disease and other diseases of the circulatory system: Secondary | ICD-10-CM | POA: Insufficient documentation

## 2013-08-01 NOTE — Patient Instructions (Signed)
Follow up in 6 weeks  Do the following things EVERYDAY: 1) Weigh yourself in the morning before breakfast. Write it down and keep it in a log. 2) Take your medicines as prescribed 3) Eat low salt foods-Limit salt (sodium) to 2000 mg per day.  4) Stay as active as you can everyday 5) Limit all fluids for the day to less than 2 liters 

## 2013-08-01 NOTE — Progress Notes (Signed)
CSW referred to assist with coping strategies due to depression. Patient was seen in clinic with his wife present. Patient states that he is depressed and is currently on medication. Patient shared multiple stressors of loss and disability. Patient and wife became tearful during visit as they were sharing life events and current health status. Patient reports he sits home most days and lacks energy or motivation. 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 Patient stated he had a dog of 14 years who passed away a year ago and was very difficult. Patient spoke of various social activities that he previously attended but has slowly reduced his involvement. Wife very supportive in conversation and encouraging patient to reengage in some of these activities. CSW provided supportive intervention and discussed coping strategies as well. Patient states that he was previously seeing a psychologist although retired and patient has appointment in August with new psychologist. Patient verbalizes understanding of visit and coping strategies discussed. Patient appreciative of time spent and will call CSW if further needs arise. Raquel Sarna, [place]

## 2013-08-29 ENCOUNTER — Ambulatory Visit (INDEPENDENT_AMBULATORY_CARE_PROVIDER_SITE_OTHER): Payer: 59 | Admitting: *Deleted

## 2013-08-29 ENCOUNTER — Ambulatory Visit: Payer: 59 | Attending: Anesthesiology | Admitting: Physical Therapy

## 2013-08-29 DIAGNOSIS — M542 Cervicalgia: Secondary | ICD-10-CM | POA: Insufficient documentation

## 2013-08-29 DIAGNOSIS — M545 Low back pain, unspecified: Secondary | ICD-10-CM | POA: Insufficient documentation

## 2013-08-29 DIAGNOSIS — I5022 Chronic systolic (congestive) heart failure: Secondary | ICD-10-CM

## 2013-08-29 DIAGNOSIS — I428 Other cardiomyopathies: Secondary | ICD-10-CM

## 2013-08-29 LAB — MDC_IDC_ENUM_SESS_TYPE_REMOTE
Battery Remaining Longevity: 58 mo
Battery Remaining Percentage: 62 %
Battery Voltage: 2.95 V
Brady Statistic AP VP Percent: 6.7 %
Brady Statistic AS VP Percent: 1 %
Brady Statistic AS VS Percent: 18 %
Brady Statistic RV Percent Paced: 7 %
Date Time Interrogation Session: 20150722074447
HIGH POWER IMPEDANCE MEASURED VALUE: 74 Ohm
HIGH POWER IMPEDANCE MEASURED VALUE: 74 Ohm
Implantable Pulse Generator Serial Number: 622169
Lead Channel Impedance Value: 380 Ohm
Lead Channel Impedance Value: 490 Ohm
Lead Channel Sensing Intrinsic Amplitude: 11.3 mV
Lead Channel Sensing Intrinsic Amplitude: 5 mV
Lead Channel Setting Pacing Amplitude: 2 V
Lead Channel Setting Pacing Amplitude: 2.5 V
Lead Channel Setting Pacing Pulse Width: 0.5 ms
Lead Channel Setting Sensing Sensitivity: 0.5 mV
MDC IDC SET ZONE DETECTION INTERVAL: 270 ms
MDC IDC STAT BRADY AP VS PERCENT: 75 %
MDC IDC STAT BRADY RA PERCENT PACED: 80 %
Zone Setting Detection Interval: 335 ms

## 2013-08-29 NOTE — Progress Notes (Signed)
Remote ICD transmission.   

## 2013-08-30 ENCOUNTER — Encounter: Payer: Self-pay | Admitting: Internal Medicine

## 2013-08-31 ENCOUNTER — Encounter: Payer: Self-pay | Admitting: *Deleted

## 2013-09-03 ENCOUNTER — Other Ambulatory Visit: Payer: Self-pay | Admitting: Physical Medicine and Rehabilitation

## 2013-09-04 ENCOUNTER — Ambulatory Visit: Payer: 59 | Admitting: Physical Therapy

## 2013-09-04 DIAGNOSIS — M542 Cervicalgia: Secondary | ICD-10-CM | POA: Diagnosis not present

## 2013-09-07 ENCOUNTER — Ambulatory Visit: Payer: 59 | Admitting: Physical Therapy

## 2013-09-07 ENCOUNTER — Other Ambulatory Visit: Payer: Self-pay | Admitting: Physical Medicine and Rehabilitation

## 2013-09-07 DIAGNOSIS — M545 Low back pain, unspecified: Secondary | ICD-10-CM

## 2013-09-07 DIAGNOSIS — M542 Cervicalgia: Secondary | ICD-10-CM | POA: Diagnosis not present

## 2013-09-11 ENCOUNTER — Ambulatory Visit
Admission: RE | Admit: 2013-09-11 | Discharge: 2013-09-11 | Disposition: A | Discharge status: Home / Self Care | Payer: 59 | Source: Ambulatory Visit | Attending: Physical Medicine and Rehabilitation | Admitting: Physical Medicine and Rehabilitation

## 2013-09-11 ENCOUNTER — Telehealth (HOSPITAL_COMMUNITY): Payer: Self-pay | Admitting: Vascular Surgery

## 2013-09-11 DIAGNOSIS — M545 Low back pain, unspecified: Secondary | ICD-10-CM

## 2013-09-11 NOTE — Telephone Encounter (Signed)
Per Junie Bame, NP hold Arlyce Harman tomorrow and keep f/u as sch on 8/6, pt's wife is aware and agreeable she states his SOB seems to be better, advised it could be r/t pain as his wt is down and he has no edema, she will continue to monitor and bring him to his appt on Thur

## 2013-09-11 NOTE — Telephone Encounter (Signed)
Spoke w/pt's wife she states pt has started coughing and having increased SOB with activity, he is also having a lot of back pain and is sch for CT scan today, he does not weigh himself daily but did weigh yesterday and wt was 245 which is down several lbs for him, she denies edema or abd distention, she state he occasionally gets dizzy with standing, does not check BP at home.  He is currently taking Lasix 80 mg in Am and 40 mg in PM and spiro 25 mg QOD, he is sch for f/u with [place] on 8/6 will discuss with MD and call her back

## 2013-09-11 NOTE — Telephone Encounter (Signed)
PT wife call pt is coughing,dizzy and sob .[name] Please Advise

## 2013-09-12 NOTE — Progress Notes (Signed)
Patient ID: Larry [name], male   DOB: 08/30/1950, 63 y.o.   MRN: [id number] PCP: Dr. Rex Kras Referring MD: Dr. Irish Lack Endocrinologist: Dr Buddy Duty  63 yo with history of chronic systolic CHF (nonischemic cardiomyopathy), CKD, prior CVA, and paroxysmal atrial fibrillation.  Patient says that he has been known to have a low EF for years.  He has a [employer], political [employer] ICD.  He had LHC in 2009 with nonobstructive disease. Last echo in 3/15 showed moderate LV dilation with EF 25-30%.    He returns for follow up.  Earlier this week he had an acute episode of dyspnea that resolved spontaneously.  Overall feels bad. SOB with steps. SOB with ADLs that just started one week ago. Not weighing daily. Able to walk slowly 400 feet. Limiting fluid intake to < 2 liters per day. Taking all medications.  Can not use CPAP due to claustrophobia. Disabled since 2007.    CPX 06/05/13: FVC 3.89 (82%), FEV1 2.99 (83%), FEV1/FVC 77%, Peak HR 118 (74% predicted max), Peak VO2 12.4 (51% of predicted; when corrected to IBW =17.7), VE/VCO2 29.9, OUES 1.39, Peak RER 1.21  Labs (2/15): LDL 103 Labs (3/15): K 4.1, creatinine 1.5, BNP 164 Labs (4/15): K 3.5, creatinine 3.3 => 1.9 => 2.1, BNP 96 Labs (4/15): K 4.3, creatinine 1.43, glucose 308 Labs 07/04/13 K 5.1 Creatinine 1.35   PMH: 1. Chronic systolic CHF: Nonischemic cardiomyopathy.  Most recent echo in 3/15 with EF 25-30%, moderate LV dilation, mild LVH, moderate diastolic dysfunction, IVC normal, mildly decreased RV systolic function.  Prior echo in 2011 also with EF 25-30%.  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  St Jude ICD. CPX 05/2013: FVC 3.89 (82%), FEV1 2.99 (83%), FEV1/FVC 77%, Peak HR 118 (74% predicted max), Peak VO2 12.4 (51% of predicted; when corrected to IBW =17.7), VE/VCO2 29.9, OUES 1.39, Peak RER 1.21 2. Type II diabetes 3. CKD 4. Monoclonal gammopathy of uncertain etiology.  5. CVA 2007 6. Degenerative disc disease.  7. Pituitary microadenoma: Nonfunctioning, s/p  surgery in 2009.  8. OSA: Unable to tolerate Bipap/CPAP. 9. Depression 10. Hypothyroidism 11. Hyperlipidemia 12. Atrial fibrillation: Paroxysmal.  Patient is on Xarelto.   SH: Married, former cigar smoker, unemployed.   FH: Father with MI x 2 and CHF, strong history of CAD on his father's side.   ROS: All systems reviewed and negative except as per HPI.   Current Outpatient Prescriptions  Medication Sig Dispense Refill  . ALPRAZolam (XANAX) 0.5 MG tablet Take 0.5 mg by mouth 3 (three) times daily as needed for anxiety.       [name] amLODipine-atorvastatin (CADUET) 5-40 MG per tablet Take 1 tablet by mouth daily.       . DULoxetine (CYMBALTA) 30 MG capsule Take 60 mg by mouth 2 (two) times daily.       . furosemide (LASIX) 80 MG tablet 80 mg 2 (two) times daily.      . insulin glargine (LANTUS) 100 UNIT/ML injection Inject 70 Units into the skin daily with supper.      . insulin lispro (HUMALOG) 100 UNIT/ML injection Inject 40 Units into the skin daily.       . metoprolol succinate (TOPROL-XL) 100 MG 24 hr tablet Take 1 tablet (100 mg total) by mouth 2 (two) times daily.  60 tablet  3  . Multiple Vitamin (MULITIVITAMIN WITH MINERALS) TABS Take 1 tablet by mouth daily.      [name] oxyCODONE-acetaminophen (PERCOCET) 10-325 MG per tablet Take 1 tablet by mouth every  4 (four) hours as needed. For pain      . rivaroxaban (XARELTO) 15 MG TABS tablet Take 1 tablet (15 mg total) by mouth daily with supper.  30 tablet    . senna (SENOKOT) 8.6 MG tablet Take 1 tablet by mouth 2 (two) times daily.      [name] spironolactone (ALDACTONE) 25 MG tablet Take 25 mg by mouth every other day.      . valsartan (DIOVAN) 160 MG tablet Take 1 tablet (160 mg total) by mouth daily.  30 tablet  11  . zolpidem (AMBIEN) 10 MG tablet Take 10 mg by mouth at bedtime as needed. For sleep       No current facility-administered medications for this encounter.    Filed Vitals:   09/13/13 0938  BP: 120/80  Pulse: 104  Weight: 250  lb 6.4 oz (113.581 kg)  SpO2: 94%   General: NAD Wife present  Neck: Thick, JVP difficult to assess d/t body habitus but does not appear elelvated. No thyromegaly or thyroid nodule.  Lungs: Clear to auscultation bilaterally with normal respiratory effort. CV: Nondisplaced PMI.  Heart regular S1/S2, no S3/S4, no murmur.  No peripheral edema.  No carotid bruit.  Normal pedal pulses.  Abdomen: Obese, Soft, tender to palpation, obese, no hepatosplenomegaly, non distented  Skin: Intact without lesions or rashes.  Neurologic: Alert and oriented x 3.  Psych: Flat affect. Extremities: No clubbing or cyanosis.   Assessment/plan: 1. Chronic systolic CHF: Nonischemic cardiomyopathy, known for years.  EF has been stable in the 25-30% (04/2013). Has ST Jude ICD.  Today he is reporting a functional decline over the last week. Now with dyspnea with ADLs and exertion. On exam he does not appear to have volume overload. Will set up [place] for next week to evaluate hemodynamics. This could represent low output and if that is the case will admit post cath. He and his wife understand and agreeable with RHC next week.  Check Pre-cath labs now. 09/13/13 K 4.8 Creatinine 1.22 Pro BNP 2047 INR 1.47  - Continue lasix 80 mg q am and 40 mg q pm as well as 25 mg spironolactone daily. He is currently noncompliant with daily weights. I have stressed the importance of daily weights.   - Continue Toprol XL to 100 mg BID.  - Continue valsartan 160 mg daily. - Reinforced the need and importance of daily weights, a low sodium diet, and fluid restriction (less than 2 L a day). Instructed to call the HF clinic if weight increases more than 3 lbs overnight or 5 lbs in a week.  2. CKD: Had AKI in 05/2013 d/t dehydration and metolazone was stopped. Followed by Dr Mercy Moore.  3. Atrial fibrillation: Paroxysmal.  Continue Xarelto 15 mg daily.  -ST Jude Device Interrogated. Had an episode of A fib July 29 with controlled rate. No ventricular  arrythmia's noted Fluid ok.  4. Obesity:- Needs to lose weight.  Asked him to cut back on his portions ans try low carb diet. 5. DM2- has follow up with Dr Buddy Duty. Glucose uncontrolled at home.  Today glucose 565 but he has not had insulin today. I have asked to take insulin as ordered and make an appointment with Dr Buddy Duty.  6. Depression- Followed by therapy  Follow up in 4 weeks with Dr Precious Bard NP-C 09/13/2013

## 2013-09-13 ENCOUNTER — Encounter (HOSPITAL_COMMUNITY): Payer: Self-pay | Admitting: Surgery

## 2013-09-13 ENCOUNTER — Ambulatory Visit (HOSPITAL_COMMUNITY)
Admission: RE | Admit: 2013-09-13 | Discharge: 2013-09-13 | Disposition: A | Discharge status: Home / Self Care | Payer: 59 | Source: Ambulatory Visit | Attending: Cardiology | Admitting: Cardiology

## 2013-09-13 ENCOUNTER — Encounter (HOSPITAL_COMMUNITY): Payer: Self-pay

## 2013-09-13 ENCOUNTER — Telehealth (HOSPITAL_COMMUNITY): Payer: Self-pay | Admitting: Surgery

## 2013-09-13 VITALS — BP 120/80 | HR 104 | Wt 250.4 lb

## 2013-09-13 DIAGNOSIS — I10 Essential (primary) hypertension: Secondary | ICD-10-CM

## 2013-09-13 DIAGNOSIS — I4891 Unspecified atrial fibrillation: Secondary | ICD-10-CM

## 2013-09-13 DIAGNOSIS — N183 Chronic kidney disease, stage 3 unspecified: Secondary | ICD-10-CM

## 2013-09-13 DIAGNOSIS — I5022 Chronic systolic (congestive) heart failure: Secondary | ICD-10-CM | POA: Insufficient documentation

## 2013-09-13 DIAGNOSIS — F341 Dysthymic disorder: Secondary | ICD-10-CM

## 2013-09-13 DIAGNOSIS — R0602 Shortness of breath: Secondary | ICD-10-CM

## 2013-09-13 DIAGNOSIS — I480 Paroxysmal atrial fibrillation: Secondary | ICD-10-CM

## 2013-09-13 DIAGNOSIS — F418 Other specified anxiety disorders: Secondary | ICD-10-CM

## 2013-09-13 LAB — CBC
HEMATOCRIT: 41.2 % (ref 39.0–52.0)
HEMOGLOBIN: 14.2 g/dL (ref 13.0–17.0)
MCH: 33.6 pg (ref 26.0–34.0)
MCHC: 34.5 g/dL (ref 30.0–36.0)
MCV: 97.4 fL (ref 78.0–100.0)
Platelets: 180 10*3/uL (ref 150–400)
RBC: 4.23 MIL/uL (ref 4.22–5.81)
RDW: 13.2 % (ref 11.5–15.5)
WBC: 6.2 10*3/uL (ref 4.0–10.5)

## 2013-09-13 LAB — BASIC METABOLIC PANEL
Anion gap: 13 (ref 5–15)
BUN: 22 mg/dL (ref 6–23)
CHLORIDE: 92 meq/L — AB (ref 96–112)
CO2: 25 meq/L (ref 19–32)
Calcium: 9.1 mg/dL (ref 8.4–10.5)
Creatinine, Ser: 1.22 mg/dL (ref 0.50–1.35)
GFR calc Af Amer: 72 mL/min — ABNORMAL LOW (ref >90)
GFR, EST NON AFRICAN AMERICAN: 62 mL/min — AB (ref >90)
GLUCOSE: 565 mg/dL — AB (ref 70–99)
POTASSIUM: 4.8 meq/L (ref 3.7–5.3)
SODIUM: 130 meq/L — AB (ref 137–147)

## 2013-09-13 LAB — PROTIME-INR
INR: 1.47 (ref 0.00–1.49)
PROTHROMBIN TIME: 17.8 s — AB (ref 11.6–15.2)

## 2013-09-13 LAB — PRO B NATRIURETIC PEPTIDE: PRO B NATRI PEPTIDE: 2047 pg/mL — AB (ref 0–125)

## 2013-09-13 NOTE — Telephone Encounter (Signed)
Called to inform patient about his blood sugar resulted labs collected at todays office visit and was elevated at 565.  He says that he had not taken his insulin today.  He was encouraged to take his insulin at next appropriate time and follow up with Dr. Buddy Duty.  He acknowledges understanding.

## 2013-09-13 NOTE — Patient Instructions (Signed)
Follow up in 4 weeks   Do the following things EVERYDAY: 1) Weigh yourself in the morning before breakfast. Write it down and keep it in a log. 2) Take your medicines as prescribed 3) Eat low salt foods-Limit salt (sodium) to 2000 mg per day.  4) Stay as active as you can everyday 5) Limit all fluids for the day to less than 2 liters

## 2013-09-14 ENCOUNTER — Telehealth (HOSPITAL_COMMUNITY): Payer: Self-pay | Admitting: Cardiology

## 2013-09-14 ENCOUNTER — Encounter (HOSPITAL_COMMUNITY): Payer: Self-pay | Admitting: Pharmacy Technician

## 2013-09-14 NOTE — Telephone Encounter (Signed)
Pt scheduled for RHC on 09/17/13 Cpt code 93453 icd 9 425.4 With pts current insurance -[employer] No pre cert is required

## 2013-09-17 ENCOUNTER — Telehealth: Payer: Self-pay | Admitting: Internal Medicine

## 2013-09-17 ENCOUNTER — Ambulatory Visit (HOSPITAL_COMMUNITY)
Admission: RE | Admit: 2013-09-17 | Discharge: 2013-09-17 | Disposition: A | Discharge status: Home / Self Care | Payer: 59 | Source: Ambulatory Visit | Attending: Cardiology | Admitting: Cardiology

## 2013-09-17 ENCOUNTER — Encounter (HOSPITAL_COMMUNITY)
Admission: RE | Disposition: A | Discharge status: Home / Self Care | Payer: Self-pay | Source: Ambulatory Visit | Attending: Cardiology

## 2013-09-17 DIAGNOSIS — I4891 Unspecified atrial fibrillation: Secondary | ICD-10-CM | POA: Insufficient documentation

## 2013-09-17 DIAGNOSIS — E785 Hyperlipidemia, unspecified: Secondary | ICD-10-CM | POA: Insufficient documentation

## 2013-09-17 DIAGNOSIS — Z87891 Personal history of nicotine dependence: Secondary | ICD-10-CM | POA: Diagnosis not present

## 2013-09-17 DIAGNOSIS — Z79899 Other long term (current) drug therapy: Secondary | ICD-10-CM | POA: Insufficient documentation

## 2013-09-17 DIAGNOSIS — E119 Type 2 diabetes mellitus without complications: Secondary | ICD-10-CM | POA: Diagnosis not present

## 2013-09-17 DIAGNOSIS — I509 Heart failure, unspecified: Secondary | ICD-10-CM | POA: Insufficient documentation

## 2013-09-17 DIAGNOSIS — E039 Hypothyroidism, unspecified: Secondary | ICD-10-CM | POA: Insufficient documentation

## 2013-09-17 DIAGNOSIS — I5022 Chronic systolic (congestive) heart failure: Secondary | ICD-10-CM | POA: Diagnosis not present

## 2013-09-17 DIAGNOSIS — Z6835 Body mass index (BMI) 35.0-35.9, adult: Secondary | ICD-10-CM | POA: Diagnosis not present

## 2013-09-17 DIAGNOSIS — G4733 Obstructive sleep apnea (adult) (pediatric): Secondary | ICD-10-CM | POA: Insufficient documentation

## 2013-09-17 DIAGNOSIS — F329 Major depressive disorder, single episode, unspecified: Secondary | ICD-10-CM | POA: Insufficient documentation

## 2013-09-17 DIAGNOSIS — Z8249 Family history of ischemic heart disease and other diseases of the circulatory system: Secondary | ICD-10-CM | POA: Insufficient documentation

## 2013-09-17 DIAGNOSIS — D472 Monoclonal gammopathy: Secondary | ICD-10-CM | POA: Diagnosis not present

## 2013-09-17 DIAGNOSIS — Z8673 Personal history of transient ischemic attack (TIA), and cerebral infarction without residual deficits: Secondary | ICD-10-CM | POA: Diagnosis not present

## 2013-09-17 DIAGNOSIS — F3289 Other specified depressive episodes: Secondary | ICD-10-CM | POA: Diagnosis not present

## 2013-09-17 DIAGNOSIS — I2789 Other specified pulmonary heart diseases: Secondary | ICD-10-CM | POA: Insufficient documentation

## 2013-09-17 DIAGNOSIS — N189 Chronic kidney disease, unspecified: Secondary | ICD-10-CM | POA: Insufficient documentation

## 2013-09-17 DIAGNOSIS — I428 Other cardiomyopathies: Secondary | ICD-10-CM | POA: Insufficient documentation

## 2013-09-17 DIAGNOSIS — Z95810 Presence of automatic (implantable) cardiac defibrillator: Secondary | ICD-10-CM | POA: Diagnosis not present

## 2013-09-17 DIAGNOSIS — E669 Obesity, unspecified: Secondary | ICD-10-CM | POA: Insufficient documentation

## 2013-09-17 DIAGNOSIS — Z7901 Long term (current) use of anticoagulants: Secondary | ICD-10-CM | POA: Diagnosis not present

## 2013-09-17 DIAGNOSIS — Z794 Long term (current) use of insulin: Secondary | ICD-10-CM | POA: Diagnosis not present

## 2013-09-17 HISTORY — PX: RIGHT HEART CATHETERIZATION: SHX5447

## 2013-09-17 LAB — BASIC METABOLIC PANEL
ANION GAP: 12 (ref 5–15)
BUN: 23 mg/dL (ref 6–23)
CHLORIDE: 99 meq/L (ref 96–112)
CO2: 27 mEq/L (ref 19–32)
Calcium: 9 mg/dL (ref 8.4–10.5)
Creatinine, Ser: 1.19 mg/dL (ref 0.50–1.35)
GFR calc Af Amer: 74 mL/min — ABNORMAL LOW (ref >90)
GFR calc non Af Amer: 64 mL/min — ABNORMAL LOW (ref >90)
Glucose, Bld: 297 mg/dL — ABNORMAL HIGH (ref 70–99)
Potassium: 5 mEq/L (ref 3.7–5.3)
SODIUM: 138 meq/L (ref 137–147)

## 2013-09-17 LAB — CBC
HCT: 38.5 % — ABNORMAL LOW (ref 39.0–52.0)
Hemoglobin: 13.3 g/dL (ref 13.0–17.0)
MCH: 32.9 pg (ref 26.0–34.0)
MCHC: 34.5 g/dL (ref 30.0–36.0)
MCV: 95.3 fL (ref 78.0–100.0)
Platelets: 200 K/uL (ref 150–400)
RBC: 4.04 MIL/uL — ABNORMAL LOW (ref 4.22–5.81)
RDW: 13 % (ref 11.5–15.5)
WBC: 5.3 K/uL (ref 4.0–10.5)

## 2013-09-17 LAB — POCT I-STAT 3, VENOUS BLOOD GAS (G3P V)
Acid-Base Excess: 1 mmol/L (ref 0.0–2.0)
Bicarbonate: 26.8 mEq/L — ABNORMAL HIGH (ref 20.0–24.0)
O2 Saturation: 58 %
PH VEN: 7.367 — AB (ref 7.250–7.300)
TCO2: 28 mmol/L (ref 0–100)
pCO2, Ven: 46.7 mmHg (ref 45.0–50.0)
pO2, Ven: 32 mmHg (ref 30.0–45.0)

## 2013-09-17 LAB — GLUCOSE, CAPILLARY: GLUCOSE-CAPILLARY: 347 mg/dL — AB (ref 70–99)

## 2013-09-17 LAB — PROTIME-INR
INR: 1.07 (ref 0.00–1.49)
Prothrombin Time: 13.9 s (ref 11.6–15.2)

## 2013-09-17 SURGERY — RIGHT HEART CATH
Anesthesia: LOCAL

## 2013-09-17 MED ORDER — ONDANSETRON HCL 4 MG/2ML IJ SOLN: 4.0000 mg | Freq: Four times a day (QID) | INTRAMUSCULAR | Status: DC | PRN

## 2013-09-17 MED ORDER — SODIUM CHLORIDE 0.9 % IV SOLN: 250.0000 mL | INTRAVENOUS | Status: DC | PRN

## 2013-09-17 MED ORDER — SODIUM CHLORIDE 0.9 % IJ SOLN: 3.0000 mL | Freq: Two times a day (BID) | INTRAMUSCULAR | Status: DC

## 2013-09-17 MED ORDER — SODIUM CHLORIDE 0.9 % IV SOLN: INTRAVENOUS | Status: DC

## 2013-09-17 MED ORDER — SODIUM CHLORIDE 0.9 % IJ SOLN: 3.0000 mL | INTRAMUSCULAR | Status: DC | PRN

## 2013-09-17 MED ORDER — MIDAZOLAM HCL 2 MG/2ML IJ SOLN
INTRAMUSCULAR | Status: AC
  Filled 2013-09-17: qty 2

## 2013-09-17 MED ORDER — FENTANYL CITRATE 0.05 MG/ML IJ SOLN
INTRAMUSCULAR | Status: AC
  Filled 2013-09-17: qty 2

## 2013-09-17 MED ORDER — LIDOCAINE HCL (PF) 1 % IJ SOLN
INTRAMUSCULAR | Status: AC
  Filled 2013-09-17: qty 30

## 2013-09-17 MED ORDER — ACETAMINOPHEN 325 MG PO TABS: 650.0000 mg | ORAL_TABLET | ORAL | Status: DC | PRN

## 2013-09-17 NOTE — CV Procedure (Addendum)
    Cardiac Catheterization Procedure Note  Name: Larry [name] MRN: [id number] DOB: [birth year]  Procedure: Right Heart Cath  Indication: Cardiomyopathy, assess filling pressure and output.    Procedural Details: The right AC was prepped, draped, and anesthetized with 1% lidocaine. There was a pre-existing peripheral IV in the right AC.  This was exchanged out for a 5 French venous sheath. A Swan-Ganz catheter was used for the right heart catheterization. Standard protocol was followed for recording of right heart pressures and sampling of oxygen saturations. Fick cardiac output was calculated. There were no immediate procedural complications. The patient was transferred to the post catheterization recovery area for further monitoring.  Procedural Findings: Hemodynamics (mmHg) RA mean 6 RV 64/7 PA 60/21, mean 36 PCWP mean 21  Oxygen saturations: PA 58% AO 96% PVR   Cardiac Output (Fick) 4.15  Cardiac Index (Fick) 1.81  PVR 3.6 WU  Final Conclusions:  Mildly elevated left heart filling pressure with moderate pulmonary hypertension.  By PVR, this is probably primarily pulmonary venous hypertension.  Cardiac output is low.   Recommendations: Given low output, I am going to start the patient on digoxin 0.125 mg daily.  I am also going to stop Lasix 80 mg po bid and have him start torsemide 80 mg daily to see if we can lower his PCWP further.  He will need followup in 1 week in CHF clinic with BMET.   May restart Xarelto tonight.   Loralie Champagne 09/17/2013, 10:17 AM

## 2013-09-17 NOTE — Interval H&P Note (Signed)
History and Physical Interval Note:  09/17/2013 9:49 AM  Larry [name]  has presented today for surgery, with the diagnosis of heart failure  The various methods of treatment have been discussed with the patient and family. After consideration of risks, benefits and other options for treatment, the patient has consented to  Procedure(s): RIGHT HEART CATH (N/A) as a surgical intervention .  The patient's history has been reviewed, patient examined, no change in status, stable for surgery.  I have reviewed the patient's chart and labs.  Questions were answered to the patient's satisfaction.     [name] [employer]

## 2013-09-17 NOTE — Telephone Encounter (Signed)
New message          Office is requesting that pt come off xarelto 2 days prior to his procedure occuring on 9/17 / Is this ok? / Office needs letter with written approval faxed to [phone]

## 2013-09-17 NOTE — Telephone Encounter (Signed)
Spoke with Tiffany at [facility] and pt scheduled for radiofrequency ablation for back pain. Procedure date of 9/17 and fax number confirmed. Tiffany aware Dr. Rayann Heman out of office until later this week and note will be sent to him to address.

## 2013-09-17 NOTE — H&P (View-Only) (Signed)
Patient ID: Larry [name], male   DOB: 1951-02-08, 63 y.o.   MRN: [id number] PCP: Dr. Rex Kras Referring MD: Dr. Irish Lack Endocrinologist: Dr Buddy Duty  63 yo with history of chronic systolic CHF (nonischemic cardiomyopathy), CKD, prior CVA, and paroxysmal atrial fibrillation.  Patient says that he has been known to have a low EF for years.  He has a [employer] officer, political party ICD.  He had LHC in 2009 with nonobstructive disease. Last echo in 3/15 showed moderate LV dilation with EF 25-30%.    He returns for follow up.  Earlier this week he had an acute episode of dyspnea that resolved spontaneously.  Overall feels bad. SOB with steps. SOB with ADLs that just started one week ago. Not weighing daily. Able to walk slowly 400 feet. Limiting fluid intake to < 2 liters per day. Taking all medications.  Can not use CPAP due to claustrophobia. Disabled since 2007.    CPX 06/05/13: FVC 3.89 (82%), FEV1 2.99 (83%), FEV1/FVC 77%, Peak HR 118 (74% predicted max), Peak VO2 12.4 (51% of predicted; when corrected to IBW =17.7), VE/VCO2 29.9, OUES 1.39, Peak RER 1.21  Labs (2/15): LDL 103 Labs (3/15): K 4.1, creatinine 1.5, BNP 164 Labs (4/15): K 3.5, creatinine 3.3 => 1.9 => 2.1, BNP 96 Labs (4/15): K 4.3, creatinine 1.43, glucose 308 Labs 07/04/13 K 5.1 Creatinine 1.35   PMH: 1. Chronic systolic CHF: Nonischemic cardiomyopathy.  Most recent echo in 3/15 with EF 25-30%, moderate LV dilation, mild LVH, moderate diastolic dysfunction, IVC normal, mildly decreased RV systolic function.  Prior echo in 2011 also with EF 25-30%.  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  St Jude ICD. CPX 05/2013: FVC 3.89 (82%), FEV1 2.99 (83%), FEV1/FVC 77%, Peak HR 118 (74% predicted max), Peak VO2 12.4 (51% of predicted; when corrected to IBW =17.7), VE/VCO2 29.9, OUES 1.39, Peak RER 1.21 2. Type II diabetes 3. CKD 4. Monoclonal gammopathy of uncertain etiology.  5. CVA 2007 6. Degenerative disc disease.  7. Pituitary microadenoma: Nonfunctioning, s/p  surgery in 2009.  8. OSA: Unable to tolerate Bipap/CPAP. 9. Depression 10. Hypothyroidism 11. Hyperlipidemia 12. Atrial fibrillation: Paroxysmal.  Patient is on Xarelto.   SH: Married, former cigar smoker, unemployed.   FH: Father with MI x 2 and CHF, strong history of CAD on his father's side.   ROS: All systems reviewed and negative except as per HPI.   Current Outpatient Prescriptions  Medication Sig Dispense Refill  . ALPRAZolam (XANAX) 0.5 MG tablet Take 0.5 mg by mouth 3 (three) times daily as needed for anxiety.       [name] amLODipine-atorvastatin (CADUET) 5-40 MG per tablet Take 1 tablet by mouth daily.       . DULoxetine (CYMBALTA) 30 MG capsule Take 60 mg by mouth 2 (two) times daily.       . furosemide (LASIX) 80 MG tablet 80 mg 2 (two) times daily.      . insulin glargine (LANTUS) 100 UNIT/ML injection Inject 70 Units into the skin daily with supper.      . insulin lispro (HUMALOG) 100 UNIT/ML injection Inject 40 Units into the skin daily.       . metoprolol succinate (TOPROL-XL) 100 MG 24 hr tablet Take 1 tablet (100 mg total) by mouth 2 (two) times daily.  60 tablet  3  . Multiple Vitamin (MULITIVITAMIN WITH MINERALS) TABS Take 1 tablet by mouth daily.      [name] oxyCODONE-acetaminophen (PERCOCET) 10-325 MG per tablet Take 1 tablet by mouth every  4 (four) hours as needed. For pain      . rivaroxaban (XARELTO) 15 MG TABS tablet Take 1 tablet (15 mg total) by mouth daily with supper.  30 tablet    . senna (SENOKOT) 8.6 MG tablet Take 1 tablet by mouth 2 (two) times daily.      [name] spironolactone (ALDACTONE) 25 MG tablet Take 25 mg by mouth every other day.      . valsartan (DIOVAN) 160 MG tablet Take 1 tablet (160 mg total) by mouth daily.  30 tablet  11  . zolpidem (AMBIEN) 10 MG tablet Take 10 mg by mouth at bedtime as needed. For sleep       No current facility-administered medications for this encounter.    Filed Vitals:   09/13/13 0938  BP: 120/80  Pulse: 104  Weight: 250  lb 6.4 oz (113.581 kg)  SpO2: 94%   General: NAD Wife present  Neck: Thick, JVP difficult to assess d/t body habitus but does not appear elelvated. No thyromegaly or thyroid nodule.  Lungs: Clear to auscultation bilaterally with normal respiratory effort. CV: Nondisplaced PMI.  Heart regular S1/S2, no S3/S4, no murmur.  No peripheral edema.  No carotid bruit.  Normal pedal pulses.  Abdomen: Obese, Soft, tender to palpation, obese, no hepatosplenomegaly, non distented  Skin: Intact without lesions or rashes.  Neurologic: Alert and oriented x 3.  Psych: Flat affect. Extremities: No clubbing or cyanosis.   Assessment/plan: 1. Chronic systolic CHF: Nonischemic cardiomyopathy, known for years.  EF has been stable in the 25-30% (04/2013). Has ST Jude ICD.  Today he is reporting a functional decline over the last week. Now with dyspnea with ADLs and exertion. On exam he does not appear to have volume overload. Will set up [place] for next week to evaluate hemodynamics. This could represent low output and if that is the case will admit post cath. He and his wife understand and agreeable with RHC next week.  Check Pre-cath labs now. 09/13/13 K 4.8 Creatinine 1.22 Pro BNP 2047 INR 1.47  - Continue lasix 80 mg q am and 40 mg q pm as well as 25 mg spironolactone daily. He is currently noncompliant with daily weights. I have stressed the importance of daily weights.   - Continue Toprol XL to 100 mg BID.  - Continue valsartan 160 mg daily. - Reinforced the need and importance of daily weights, a low sodium diet, and fluid restriction (less than 2 L a day). Instructed to call the HF clinic if weight increases more than 3 lbs overnight or 5 lbs in a week.  2. CKD: Had AKI in 05/2013 d/t dehydration and metolazone was stopped. Followed by Dr Mercy Moore.  3. Atrial fibrillation: Paroxysmal.  Continue Xarelto 15 mg daily.  -ST Jude Device Interrogated. Had an episode of A fib July 29 with controlled rate. No ventricular  arrythmia's noted Fluid ok.  4. Obesity:- Needs to lose weight.  Asked him to cut back on his portions ans try low carb diet. 5. DM2- has follow up with Dr Buddy Duty. Glucose uncontrolled at home.  Today glucose 565 but he has not had insulin today. I have asked to take insulin as ordered and make an appointment with Dr Buddy Duty.  6. Depression- Followed by therapy  Follow up in 4 weeks with Dr Precious Bard NP-C 09/13/2013

## 2013-09-17 NOTE — Discharge Instructions (Signed)
Arteriogram Care After These instructions give you information on caring for yourself after your procedure. Your doctor may also give you more specific instructions. Call your doctor if you have any problems or questions after your procedure. HOME CARE  Do not bathe, swim, or use a hot tub until directed by your doctor. You can shower.  Do not lift anything heavier than 10 pounds (about a gallon of milk) for 2 days.  Return to normal activities in 24 hours or as told by your doctor. Finding out the results of your test Ask when your test results will be ready. Make sure you get your test results. GET HELP RIGHT AWAY IF:   You have fever.  The site that was cut is:  Bleeding.  Puffy (swollen) or red.  Cold.  Pale or changes color.  Weak.  Tingly or numb. If you go to the Emergency Room, tell your nurse that you have had an arteriogram. Take this paper with you to show the nurse. MAKE SURE YOU:  Understand these instructions.  Will watch your condition.  Will get help right away if you are not doing well or get worse. Document Released: 04/23/2008 Document Revised: 01/30/2013 Document Reviewed: 04/23/2008 [facility] Patient Information 2015 [place], [place]. This information is not intended to replace advice given to you by your health care provider. Make sure you discuss any questions you have with your health care provider.

## 2013-09-20 ENCOUNTER — Encounter: Payer: Self-pay | Admitting: Cardiology

## 2013-09-23 NOTE — Telephone Encounter (Signed)
Dr Irish Lack is his primary cardiologist.  I will forward to him.

## 2013-09-24 ENCOUNTER — Ambulatory Visit (HOSPITAL_COMMUNITY)
Admit: 2013-09-24 | Discharge: 2013-09-24 | Disposition: A | Discharge status: Home / Self Care | Payer: 59 | Source: Ambulatory Visit | Attending: Cardiology | Admitting: Cardiology

## 2013-09-24 ENCOUNTER — Encounter (HOSPITAL_COMMUNITY): Payer: Self-pay

## 2013-09-24 ENCOUNTER — Encounter: Payer: Self-pay | Admitting: Cardiology

## 2013-09-24 VITALS — BP 118/74 | HR 88 | Wt 244.8 lb

## 2013-09-24 DIAGNOSIS — N189 Chronic kidney disease, unspecified: Secondary | ICD-10-CM | POA: Insufficient documentation

## 2013-09-24 DIAGNOSIS — E785 Hyperlipidemia, unspecified: Secondary | ICD-10-CM | POA: Diagnosis not present

## 2013-09-24 DIAGNOSIS — I480 Paroxysmal atrial fibrillation: Secondary | ICD-10-CM

## 2013-09-24 DIAGNOSIS — F329 Major depressive disorder, single episode, unspecified: Secondary | ICD-10-CM | POA: Diagnosis not present

## 2013-09-24 DIAGNOSIS — I5022 Chronic systolic (congestive) heart failure: Secondary | ICD-10-CM | POA: Insufficient documentation

## 2013-09-24 DIAGNOSIS — R5381 Other malaise: Secondary | ICD-10-CM | POA: Insufficient documentation

## 2013-09-24 DIAGNOSIS — E669 Obesity, unspecified: Secondary | ICD-10-CM | POA: Insufficient documentation

## 2013-09-24 DIAGNOSIS — F3289 Other specified depressive episodes: Secondary | ICD-10-CM | POA: Diagnosis not present

## 2013-09-24 DIAGNOSIS — I428 Other cardiomyopathies: Secondary | ICD-10-CM | POA: Diagnosis not present

## 2013-09-24 DIAGNOSIS — G4733 Obstructive sleep apnea (adult) (pediatric): Secondary | ICD-10-CM | POA: Insufficient documentation

## 2013-09-24 DIAGNOSIS — I4891 Unspecified atrial fibrillation: Secondary | ICD-10-CM | POA: Diagnosis not present

## 2013-09-24 DIAGNOSIS — Z794 Long term (current) use of insulin: Secondary | ICD-10-CM | POA: Diagnosis not present

## 2013-09-24 DIAGNOSIS — R5383 Other fatigue: Secondary | ICD-10-CM

## 2013-09-24 DIAGNOSIS — D353 Benign neoplasm of craniopharyngeal duct: Secondary | ICD-10-CM

## 2013-09-24 DIAGNOSIS — Z8673 Personal history of transient ischemic attack (TIA), and cerebral infarction without residual deficits: Secondary | ICD-10-CM | POA: Insufficient documentation

## 2013-09-24 DIAGNOSIS — D352 Benign neoplasm of pituitary gland: Secondary | ICD-10-CM | POA: Diagnosis not present

## 2013-09-24 DIAGNOSIS — Z87891 Personal history of nicotine dependence: Secondary | ICD-10-CM | POA: Diagnosis not present

## 2013-09-24 DIAGNOSIS — D472 Monoclonal gammopathy: Secondary | ICD-10-CM | POA: Diagnosis not present

## 2013-09-24 DIAGNOSIS — Z95810 Presence of automatic (implantable) cardiac defibrillator: Secondary | ICD-10-CM | POA: Diagnosis not present

## 2013-09-24 DIAGNOSIS — E039 Hypothyroidism, unspecified: Secondary | ICD-10-CM | POA: Insufficient documentation

## 2013-09-24 DIAGNOSIS — N183 Chronic kidney disease, stage 3 unspecified: Secondary | ICD-10-CM

## 2013-09-24 DIAGNOSIS — E119 Type 2 diabetes mellitus without complications: Secondary | ICD-10-CM | POA: Diagnosis not present

## 2013-09-24 DIAGNOSIS — IMO0002 Reserved for concepts with insufficient information to code with codable children: Secondary | ICD-10-CM | POA: Insufficient documentation

## 2013-09-24 DIAGNOSIS — I509 Heart failure, unspecified: Secondary | ICD-10-CM | POA: Insufficient documentation

## 2013-09-24 LAB — BASIC METABOLIC PANEL
Anion gap: 15 (ref 5–15)
BUN: 44 mg/dL — AB (ref 6–23)
CO2: 24 mEq/L (ref 19–32)
CREATININE: 1.57 mg/dL — AB (ref 0.50–1.35)
Calcium: 9.5 mg/dL (ref 8.4–10.5)
Chloride: 95 mEq/L — ABNORMAL LOW (ref 96–112)
GFR calc non Af Amer: 46 mL/min — ABNORMAL LOW (ref >90)
GFR, EST AFRICAN AMERICAN: 53 mL/min — AB (ref >90)
Glucose, Bld: 382 mg/dL — ABNORMAL HIGH (ref 70–99)
POTASSIUM: 4.7 meq/L (ref 3.7–5.3)
Sodium: 134 mEq/L — ABNORMAL LOW (ref 137–147)

## 2013-09-24 LAB — DIGOXIN LEVEL: Digoxin Level: 1.1 ng/mL (ref 0.8–2.0)

## 2013-09-24 MED ORDER — ATORVASTATIN CALCIUM 40 MG PO TABS
40.0000 mg | ORAL_TABLET | Freq: Every day | ORAL | Status: DC
Start: 2013-09-24 — End: 2013-10-11

## 2013-09-24 MED ORDER — TORSEMIDE 20 MG PO TABS: 60.0000 mg | ORAL_TABLET | Freq: Every day | ORAL | Status: DC

## 2013-09-24 NOTE — Patient Instructions (Signed)
Stop Caduet  Start Atorvastatin 40 mg daily  Decrease Torsemide to 60 mg (3 tabs) daily  Labs today  Your physician recommends that you schedule a follow-up appointment in: 2 weeks

## 2013-09-24 NOTE — Telephone Encounter (Signed)
OK with me. I will send to Dr. Aundra Dubin who seees him for heart failure, to get his approval.

## 2013-09-24 NOTE — Telephone Encounter (Signed)
Spoke with him today, should be ok.

## 2013-09-24 NOTE — Progress Notes (Signed)
Patient ID: Larry [name], male   DOB: [birth year], 63 y.o.   MRN: [id number] PCP: Dr. Rex Kras Referring MD: Dr. Irish Lack Endocrinologist: Dr Buddy Duty  63 yo with history of chronic systolic CHF (nonischemic cardiomyopathy), CKD, prior CVA, and paroxysmal atrial fibrillation.  Patient says that he has been known to have a low EF for years.  He has a Research officer, political party ICD.  He had LHC in 2009 with nonobstructive disease. Last echo in 3/15 showed moderate LV dilation with EF 25-30%.    Patient had RHC earlier this month showing low cardiac output, mildly elevated PCWP and primarily pulmonary venous hypertension.  He was transitioned from Lasix to torsemide and started on digoxin.  Patient thinks that his breathing has improved a bit.  He is now short of breath after walking about 500 feet.  He is still profoundly fatigued and also has daytime sleepiness.  Can not use CPAP due to claustrophobia.  He has been getting lightheaded with standing since I switched him from Lasix to torsemide.  Labs today showed that creatinine is up.  Disabled since 2007.  In general, he is not very active due to back pain.   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 Chronic systolic CHF: Nonischemic cardiomyopathy.  Most recent echo in 3/15 with EF 25-30%, moderate LV dilation, mild LVH, moderate diastolic dysfunction, IVC normal, mildly decreased RV systolic function.  Prior echo in 2011 also with EF 25-30%.  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  [employer] Jude ICD. CPX 05/2013: FVC 3.89 (82%), FEV1 2.99 (83%), FEV1/FVC 77%, Peak HR 118 (74% predicted max), Peak VO2 12.4 (51% of predicted; when corrected to IBW =17.7), VE/VCO2 29.9, OUES 1.39, Peak RER 1.21.  RHC  (8/15) with mean RA 6, PA 60/21 mean 36, mean PCWP 21, CI 1.81, PVR 3.6 WU.  2. Type II diabetes 3. CKD 4. Monoclonal gammopathy of uncertain etiology.  5. CVA 2007 6. Degenerative disc disease.  7. Pituitary microadenoma: Nonfunctioning, s/p surgery in 2009.  8. OSA: Unable to tolerate Bipap/CPAP. 9. Depression 10. Hypothyroidism 11. Hyperlipidemia 12. Atrial fibrillation: Paroxysmal.  Patient is on Xarelto.   SH: Married, former cigar smoker, unemployed.   FH: Father with MI x 2 and CHF, strong history of CAD on his father's side.   ROS: All systems reviewed and negative except as per HPI.   Current Outpatient Prescriptions  Medication Sig Dispense Refill  . ALPRAZolam (XANAX) 0.5 MG tablet Take 0.5 mg by mouth 3 (three) times daily as needed for anxiety.       . digoxin (LANOXIN) 0.125 MG tablet Take 0.125 mg by mouth daily.      . DULoxetine (CYMBALTA) 30 MG capsule Take 60 mg by mouth 2 (two) times daily.       . insulin aspart (NOVOLOG) 100 UNIT/ML injection Inject 60 Units into the skin 3 (three) times daily before meals.      . metoprolol succinate (TOPROL-XL) 100 MG 24 hr tablet Take 100 mg by mouth daily. 100 mg in am and 50 mg in pm      . Multiple Vitamin (MULITIVITAMIN WITH MINERALS) TABS Take 1 tablet by mouth daily.      [name]  oxyCODONE-acetaminophen (PERCOCET) 10-325 MG per tablet Take 1 tablet by mouth every 4 (four) hours as needed for pain. For pain      . rivaroxaban (XARELTO) 15 MG TABS tablet Take 1 tablet (15 mg total) by mouth daily with supper.  30 tablet    . senna (SENOKOT) 8.6 MG tablet Take 1 tablet by mouth 2 (two) times daily.      [name] spironolactone (ALDACTONE) 25 MG tablet Take 25 mg by mouth every other day.      . torsemide (DEMADEX) 20 MG tablet Take 3 tablets (60 mg total) by mouth daily.      . valsartan (DIOVAN) 160 MG tablet Take 1 tablet (160 mg total) by mouth daily.  30 tablet  11  . zolpidem (AMBIEN) 10 MG tablet Take 10 mg by mouth at bedtime as  needed for sleep. For sleep      . atorvastatin (LIPITOR) 40 MG tablet Take 1 tablet (40 mg total) by mouth daily.  30 tablet  3   No current facility-administered medications for this encounter.    Filed Vitals:   09/24/13 1005  BP: 118/74  Pulse: 88  Weight: 244 lb 12.8 oz (111.041 kg)  SpO2: 96%   General: NAD Wife present  Neck: Thick, JVP difficult to assess d/t body habitus but does not appear elevated. No thyromegaly or thyroid nodule.  Lungs: Clear to auscultation bilaterally with normal respiratory effort. CV: Nondisplaced PMI.  Heart regular S1/S2, no S3/S4, no murmur.  No peripheral edema.  No carotid bruit.  Normal pedal pulses.  Abdomen: Obese, Soft, tender to palpation, obese, no hepatosplenomegaly, non distented  Skin: Intact without lesions or rashes.  Neurologic: Alert and oriented x 3.  Psych: Flat affect. Extremities: No clubbing or cyanosis.   Assessment/plan: 1. Chronic systolic CHF: Nonischemic cardiomyopathy, known for years.  EF has been stable at 25-30% (04/2013). Has ST Jude ICD, narrow QRS so not CRT candidate. CPX VO2 low but slope ok.  NYHA class II-III symptoms (some improvement with torsemide).  RHC with low cardiac output, started on digoxin.  Today, he is not volume overloaded on exam and with orthostatic dizziness may be a bit overdiuresed.  Creatinine is elevated from his baseline.  - Hold torsemide for a day and decrease to 40 mg daily.  - Continue digoxin give low output on RHC.  Digoxin level 1.1 but creatinine is up.  Would recheck when creatinine back to baseline.  - Continue Toprol XL 100 qam/50 qpm. - Continue valsartan 160 mg daily for now, when creatinine is stabilized would like to transition to [place]. - Continue current spironolactone, K is ok today.  - With orthostatic symptoms, I am going to have him stop [place] 5/40 and take just atorvastatin 40 mg daily instead.  - We discussed the fact that he may be nearing the need for advanced  therapies.  Weight will be an issue for transplant.  Will see how he does with med adjustments but may need to move towards LVAD vs transplant in near future.  - Reinforced the need and importance of daily weights, a low sodium diet, and fluid restriction (less than 2 L a day). Instructed to call the HF clinic if weight increases more than 3 lbs overnight or 5 lbs in a week.  2. CKD: As above, cutting back on torsemide and will repeat BMET at 2 wk followup.  3. Atrial fibrillation: Paroxysmal.  Continue Xarelto 15 mg daily. He is not in atrial fibrillation  today.  4. Obesity:  Needs to lose weight.  Asked him to cut back on his portions and try low carb diet. 5. DM2: Continue to follow with endocrinology.   6. Depression: Seems to be ongoing issue.  7. Fatigue: In addition to CHF, I think that OSA (that is untreated as he is unable to wear CPAP) and depression play a large role.   Followup in 2 wks.   Loralie Champagne 09/24/2013

## 2013-09-24 NOTE — Telephone Encounter (Signed)
Faxed to [place] [facility].

## 2013-09-25 ENCOUNTER — Telehealth (HOSPITAL_COMMUNITY): Payer: Self-pay | Admitting: *Deleted

## 2013-09-25 DIAGNOSIS — I5022 Chronic systolic (congestive) heart failure: Secondary | ICD-10-CM

## 2013-09-25 MED ORDER — TORSEMIDE 20 MG PO TABS: 40.0000 mg | ORAL_TABLET | Freq: Every day | ORAL | Status: DC

## 2013-09-25 NOTE — Telephone Encounter (Signed)
Message copied by Scarlette Calico on Tue Sep 25, 2013 10:30 AM ------      Message from: [name]      Created: Mon Sep 24, 2013 12:28 PM       Creatinine higher.  Hold torsemide tomorrow and then decrease torsemide to 40 mg daily.  Will need BMET next week. ------

## 2013-09-25 NOTE — Telephone Encounter (Signed)
Pt agreeable, order placed

## 2013-10-02 ENCOUNTER — Other Ambulatory Visit (INDEPENDENT_AMBULATORY_CARE_PROVIDER_SITE_OTHER): Payer: 59

## 2013-10-02 DIAGNOSIS — I5022 Chronic systolic (congestive) heart failure: Secondary | ICD-10-CM

## 2013-10-02 LAB — BASIC METABOLIC PANEL
BUN: 25 mg/dL — AB (ref 6–23)
CALCIUM: 8.9 mg/dL (ref 8.4–10.5)
CO2: 28 mEq/L (ref 19–32)
Chloride: 98 mEq/L (ref 96–112)
Creatinine, Ser: 1.6 mg/dL — ABNORMAL HIGH (ref 0.4–1.5)
GFR: 45.58 mL/min — ABNORMAL LOW (ref >60.00)
GLUCOSE: 271 mg/dL — AB (ref 70–99)
Potassium: 3.8 mEq/L (ref 3.5–5.1)
Sodium: 136 mEq/L (ref 135–145)

## 2013-10-03 ENCOUNTER — Other Ambulatory Visit: Payer: 59

## 2013-10-03 ENCOUNTER — Encounter: Payer: Self-pay | Admitting: Internal Medicine

## 2013-10-09 ENCOUNTER — Ambulatory Visit (HOSPITAL_COMMUNITY)
Admission: RE | Admit: 2013-10-09 | Discharge: 2013-10-09 | Disposition: A | Discharge status: Home / Self Care | Payer: 59 | Source: Ambulatory Visit | Attending: Cardiology | Admitting: Cardiology

## 2013-10-09 ENCOUNTER — Encounter (HOSPITAL_COMMUNITY): Payer: Self-pay

## 2013-10-09 VITALS — BP 124/78 | HR 82 | Wt 247.4 lb

## 2013-10-09 DIAGNOSIS — I509 Heart failure, unspecified: Secondary | ICD-10-CM | POA: Insufficient documentation

## 2013-10-09 DIAGNOSIS — G4733 Obstructive sleep apnea (adult) (pediatric): Secondary | ICD-10-CM | POA: Diagnosis not present

## 2013-10-09 DIAGNOSIS — F329 Major depressive disorder, single episode, unspecified: Secondary | ICD-10-CM | POA: Insufficient documentation

## 2013-10-09 DIAGNOSIS — R0602 Shortness of breath: Secondary | ICD-10-CM

## 2013-10-09 DIAGNOSIS — E669 Obesity, unspecified: Secondary | ICD-10-CM | POA: Insufficient documentation

## 2013-10-09 DIAGNOSIS — Z87891 Personal history of nicotine dependence: Secondary | ICD-10-CM | POA: Diagnosis not present

## 2013-10-09 DIAGNOSIS — N183 Chronic kidney disease, stage 3 unspecified: Secondary | ICD-10-CM | POA: Diagnosis not present

## 2013-10-09 DIAGNOSIS — I5022 Chronic systolic (congestive) heart failure: Secondary | ICD-10-CM | POA: Insufficient documentation

## 2013-10-09 DIAGNOSIS — R5383 Other fatigue: Secondary | ICD-10-CM

## 2013-10-09 DIAGNOSIS — F3289 Other specified depressive episodes: Secondary | ICD-10-CM | POA: Diagnosis not present

## 2013-10-09 DIAGNOSIS — Z8673 Personal history of transient ischemic attack (TIA), and cerebral infarction without residual deficits: Secondary | ICD-10-CM | POA: Diagnosis not present

## 2013-10-09 DIAGNOSIS — R5381 Other malaise: Secondary | ICD-10-CM | POA: Insufficient documentation

## 2013-10-09 DIAGNOSIS — I4891 Unspecified atrial fibrillation: Secondary | ICD-10-CM | POA: Diagnosis not present

## 2013-10-09 DIAGNOSIS — E119 Type 2 diabetes mellitus without complications: Secondary | ICD-10-CM | POA: Diagnosis not present

## 2013-10-09 DIAGNOSIS — G4730 Sleep apnea, unspecified: Secondary | ICD-10-CM

## 2013-10-09 DIAGNOSIS — Z8249 Family history of ischemic heart disease and other diseases of the circulatory system: Secondary | ICD-10-CM | POA: Insufficient documentation

## 2013-10-09 LAB — DIGOXIN LEVEL: DIGOXIN LVL: 1.2 ng/mL (ref 0.8–2.0)

## 2013-10-09 LAB — BASIC METABOLIC PANEL
ANION GAP: 15 (ref 5–15)
BUN: 21 mg/dL (ref 6–23)
CALCIUM: 8.9 mg/dL (ref 8.4–10.5)
CO2: 28 mEq/L (ref 19–32)
CREATININE: 1.32 mg/dL (ref 0.50–1.35)
Chloride: 93 mEq/L — ABNORMAL LOW (ref 96–112)
GFR, EST AFRICAN AMERICAN: 66 mL/min — AB (ref >90)
GFR, EST NON AFRICAN AMERICAN: 57 mL/min — AB (ref >90)
Glucose, Bld: 385 mg/dL — ABNORMAL HIGH (ref 70–99)
Potassium: 4.1 mEq/L (ref 3.7–5.3)
Sodium: 136 mEq/L — ABNORMAL LOW (ref 137–147)

## 2013-10-09 LAB — PRO B NATRIURETIC PEPTIDE: Pro B Natriuretic peptide (BNP): 1561 pg/mL — ABNORMAL HIGH (ref 0–125)

## 2013-10-09 MED ORDER — SPIRONOLACTONE 25 MG PO TABS: 25.0000 mg | ORAL_TABLET | Freq: Every day | ORAL | Status: DC

## 2013-10-09 MED ORDER — SACUBITRIL-VALSARTAN 24-26 MG PO TABS: 1.0000 | ORAL_TABLET | Freq: Two times a day (BID) | ORAL | Status: DC

## 2013-10-09 NOTE — Patient Instructions (Signed)
Stop Valsartan  Start Entresto 24/26 mg Twice daily   Increase Spironolactone to 25 mg daily  Labs today and in 2 weeks (bmet) at [facility] have been referred to cardiac rehab, they will contact you to schedule appointment  Your physician recommends that you schedule a follow-up appointment in: 1 month

## 2013-10-09 NOTE — Progress Notes (Signed)
Patient ID: Larry [name], male   DOB: 06-Apr-1950, 63 y.o.   MRN: [id number] PCP: Dr. Rex Kras Referring MD: Dr. Irish Lack Endocrinologist: Dr Buddy Duty  63 yo with history of chronic systolic CHF (nonischemic cardiomyopathy), CKD, prior CVA, and paroxysmal atrial fibrillation.  Patient says that he has been known to have a low EF for years.  He has a Research officer, political party ICD.  He had LHC in 2009 with nonobstructive disease. Last echo in 3/15 showed moderate LV dilation with EF 25-30%.    Patient had [place] 8/15 showing low cardiac output, mildly elevated PCWP and primarily pulmonary venous hypertension.  He was transitioned from Lasix to torsemide and started on digoxin.  Creatinine rose and I had to cut back on his torsemide.  Currently, he is doing well.  Mild lightheadedness with standing on occasion.  No orthopnea/PND.  No chest pain.  No significant dyspnea walking on flat ground.  He does get fatigued walking up steps or walking around in the grocery store.  He has not been able to wear CPAP in the past.  Wife reports apneas at night up to 30 seconds.  Weight is up 3 lbs.   Labs (2/15): LDL 103 Labs (3/15): K 4.1, creatinine 1.5, BNP 164 Labs (4/15): K 3.5, creatinine 3.3 => 1.9 => 2.1, BNP 96 Labs (4/15): K 4.3, creatinine 1.43, glucose 308 Labs (5/15): K 5.1 Creatinine 1.35  Labs (8/15): K 5 => 4.7 => 3.8, creatinine 1.19 => 1.57 => 1.6, digoxin 1.1  PMH: 1. Chronic systolic CHF: Nonischemic cardiomyopathy.  Most recent echo in 3/15 with EF 25-30%, moderate LV dilation, mild LVH, moderate diastolic dysfunction, IVC normal, mildly decreased RV systolic function.  Prior echo in 2011 also with EF 25-30%.  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  St Jude ICD. CPX 05/2013: FVC 3.89 (82%), FEV1 2.99 (83%), FEV1/FVC 77%, Peak HR 118 (74% predicted max), Peak VO2 12.4 (51% of predicted; when corrected to IBW =17.7), VE/VCO2 29.9, OUES 1.39, Peak RER 1.21.  RHC (8/15) with mean RA 6, PA 60/21 mean 36, mean PCWP 21, CI 1.81, PVR  3.6 WU.  2. Type II diabetes 3. CKD 4. Monoclonal gammopathy of uncertain etiology.  5. CVA 2007 6. Degenerative disc disease.  7. Pituitary microadenoma: Nonfunctioning, s/p surgery in 2009.  8. OSA: Unable to tolerate Bipap/CPAP. 9. Depression 10. Hypothyroidism 11. Hyperlipidemia 12. Atrial fibrillation: Paroxysmal.  Patient is on Xarelto.   SH: Married, former cigar smoker, unemployed.   FH: Father with MI x 2 and CHF, strong history of CAD on his father's side.   ROS: All systems reviewed and negative except as per HPI.   Current Outpatient Prescriptions  Medication Sig Dispense Refill  . ALPRAZolam (XANAX) 0.5 MG tablet Take 0.5 mg by mouth 3 (three) times daily as needed for anxiety.       [name] atorvastatin (LIPITOR) 40 MG tablet Take 1 tablet (40 mg total) by mouth daily.  30 tablet  3  . digoxin (LANOXIN) 0.125 MG tablet Take 0.125 mg by mouth daily.      . DULoxetine (CYMBALTA) 30 MG capsule Take 60 mg by mouth 2 (two) times daily.       . insulin aspart (NOVOLOG) 100 UNIT/ML injection Inject 60 Units into the skin 3 (three) times daily before meals.      . metoprolol succinate (TOPROL-XL) 100 MG 24 hr tablet Take 100 mg by mouth daily. 100 mg in am and 50 mg in pm      .  Multiple Vitamin (MULITIVITAMIN WITH MINERALS) TABS Take 1 tablet by mouth daily.      [name] oxyCODONE-acetaminophen (PERCOCET) 10-325 MG per tablet Take 1 tablet by mouth every 4 (four) hours as needed for pain. For pain      . rivaroxaban (XARELTO) 15 MG TABS tablet Take 1 tablet (15 mg total) by mouth daily with supper.  30 tablet    . senna (SENOKOT) 8.6 MG tablet Take 1 tablet by mouth 2 (two) times daily.      [name] spironolactone (ALDACTONE) 25 MG tablet Take 1 tablet (25 mg total) by mouth daily.  30 tablet  6  . torsemide (DEMADEX) 20 MG tablet Take 2 tablets (40 mg total) by mouth daily.      [name] zolpidem (AMBIEN) 10 MG tablet Take 10 mg by mouth at bedtime as needed for sleep. For sleep      .  Sacubitril-Valsartan (ENTRESTO) 24-26 MG TABS Take 1 tablet by mouth 2 (two) times daily.  60 tablet  6   No current facility-administered medications for this encounter.    Filed Vitals:   10/09/13 0905  BP: 124/78  Pulse: 82  Weight: 247 lb 6.4 oz (112.22 kg)  SpO2: 95%   General: NAD Wife present  Neck: Thick, JVP difficult to assess d/t body habitus but does not appear elevated. No thyromegaly or thyroid nodule.  Lungs: Clear to auscultation bilaterally with normal respiratory effort. CV: Nondisplaced PMI.  Heart regular S1/S2, no S3/S4, no murmur.  No peripheral edema.  No carotid bruit.  Normal pedal pulses.  Abdomen: Obese, Soft, tender to palpation, obese, no hepatosplenomegaly, non distented  Skin: Intact without lesions or rashes.  Neurologic: Alert and oriented x 3.  Psych: Flat affect. Extremities: No clubbing or cyanosis.   Assessment/plan: 1. Chronic systolic CHF: Nonischemic cardiomyopathy, known for years.  EF has been stable at 25-30% (04/2013). Has ST Jude ICD, narrow QRS so not CRT candidate. CPX VO2 low but slope ok.  NYHA class II-III symptoms (some improvement with torsemide).  RHC with low cardiac output, started on digoxin.  Today, he is not significantly volume overloaded on exam but does have significant fatigue. - Continue torsemide 40 mg daily, BMET today.  - Continue digoxin give low output on RHC.  Digoxin level 1.1 but creatinine was up.  Recheck digoxin level today. - Continue Toprol XL 100 qam/50 qpm. - Continue valsartan 160 mg daily for now, will work on getting him transitioned to [employer] 24/26 bid (will stop valsartan when this is started).  - Increase spironolactone to 25 mg daily with BMET/BNP in 2 wks.   - We discussed the fact that he may be nearing the need for advanced therapies.  Weight will be an issue for transplant.  Will see how he does with med adjustments but may need to move towards LVAD vs transplant in near future.  - Reinforced the  need and importance of daily weights, a low sodium diet, and fluid restriction (less than 2 L a day). Instructed to call the HF clinic if weight increases more than 3 lbs overnight or 5 lbs in a week.  2. CKD: Most recent creatinine stable after rise, will repeat today.  3. Atrial fibrillation: Paroxysmal.  Continue Xarelto 15 mg daily. He is not in atrial fibrillation today.  4. Obesity:  Needs to lose weight.  Asked him to cut back on his portions and try low carb diet. 5. DM2: Continue to follow with endocrinology.   6. Depression:  Seems to be ongoing issue.  7. Fatigue: In addition to CHF, I think that OSA (that is untreated as he is unable to wear CPAP) and depression play a large role. I am going to have him go back to pulmonary to see if anything can be done for OSA (30 second apneas per wife).    Followup in 1 month  Loralie Champagne 10/09/2013

## 2013-10-11 ENCOUNTER — Other Ambulatory Visit: Payer: Self-pay

## 2013-10-11 ENCOUNTER — Encounter: Payer: Self-pay | Admitting: Internal Medicine

## 2013-10-11 ENCOUNTER — Telehealth (HOSPITAL_COMMUNITY): Payer: Self-pay | Admitting: *Deleted

## 2013-10-11 MED ORDER — ATORVASTATIN CALCIUM 40 MG PO TABS: 40.0000 mg | ORAL_TABLET | Freq: Every day | ORAL | Status: DC

## 2013-10-11 MED ORDER — DIGOXIN 125 MCG PO TABS: 0.1250 mg | ORAL_TABLET | Freq: Every day | ORAL | Status: DC

## 2013-10-11 MED ORDER — ATORVASTATIN CALCIUM 40 MG PO TABS
40.0000 mg | ORAL_TABLET | Freq: Every day | ORAL | Status: DC
Start: 2013-10-11 — End: 2013-10-11

## 2013-10-11 NOTE — Telephone Encounter (Signed)
Received form from pharmacy that pt needs PA for [facility] called insurance at [phone] (id # [id number]) med approved from 09/11/13-10/11/14 approval # [id number], [facility] aware

## 2013-10-12 ENCOUNTER — Telehealth (HOSPITAL_COMMUNITY): Payer: Self-pay | Admitting: *Deleted

## 2013-10-12 DIAGNOSIS — I5022 Chronic systolic (congestive) heart failure: Secondary | ICD-10-CM

## 2013-10-12 NOTE — Telephone Encounter (Signed)
Repeat 9/15 pt's wife aware and he will not take Digoxin that am

## 2013-10-12 NOTE — Telephone Encounter (Signed)
Message copied by Scarlette Calico on Fri Oct 12, 2013 10:27 AM ------      Message from: [name]      Created: Fri Oct 12, 2013  8:25 AM       Let's repeat digoxin level prior to his am dose some time in the next couple of weeks when convenient to him. ------

## 2013-10-23 ENCOUNTER — Telehealth: Payer: Self-pay | Admitting: *Deleted

## 2013-10-23 ENCOUNTER — Other Ambulatory Visit: Payer: Self-pay | Admitting: *Deleted

## 2013-10-23 ENCOUNTER — Other Ambulatory Visit (INDEPENDENT_AMBULATORY_CARE_PROVIDER_SITE_OTHER): Payer: 59

## 2013-10-23 DIAGNOSIS — I480 Paroxysmal atrial fibrillation: Secondary | ICD-10-CM

## 2013-10-23 DIAGNOSIS — I5022 Chronic systolic (congestive) heart failure: Secondary | ICD-10-CM

## 2013-10-23 DIAGNOSIS — R0602 Shortness of breath: Secondary | ICD-10-CM

## 2013-10-23 LAB — BASIC METABOLIC PANEL
BUN: 33 mg/dL — AB (ref 6–23)
CO2: 25 mEq/L (ref 19–32)
CREATININE: 1.9 mg/dL — AB (ref 0.4–1.5)
Calcium: 8.7 mg/dL (ref 8.4–10.5)
Chloride: 94 mEq/L — ABNORMAL LOW (ref 96–112)
GFR: 39.17 mL/min — AB (ref >60.00)
GLUCOSE: 572 mg/dL — AB (ref 70–99)
Potassium: 4.4 mEq/L (ref 3.5–5.1)
Sodium: 129 mEq/L — ABNORMAL LOW (ref 135–145)

## 2013-10-23 NOTE — Telephone Encounter (Signed)
Lab called with critical on pt with a BS of 572.  Reported this critical lab to pts Primary Cardiologist Dr Aundra Dubin and nurse at 4:57pm.

## 2013-10-23 NOTE — Telephone Encounter (Signed)
Per Dr Aundra Dubin repeat Digoxin level not needed in 1 week  since one was done today but not resulted yet.

## 2013-10-24 ENCOUNTER — Telehealth (HOSPITAL_COMMUNITY): Payer: Self-pay | Admitting: Vascular Surgery

## 2013-10-24 LAB — DIGOXIN LEVEL: Digoxin Level: 0.6 ng/mL — ABNORMAL LOW (ref 0.8–2.0)

## 2013-10-24 NOTE — Telephone Encounter (Signed)
Pt wife returning call to Nira Conn about husbands medication changes made yesterday due to lab results.[name]

## 2013-10-24 NOTE — Telephone Encounter (Signed)
Spoke w/pt's wife advised per Dr Aundra Dubin pt to hold Torsemide for 2 days then restart at 20 mg daily, she will make changes to pill box, she states pt called Dr Cindra Eves office last night regarding glucose and they made changes to his insulin and this AM glucose was 330

## 2013-10-25 ENCOUNTER — Telehealth: Payer: Self-pay | Admitting: *Deleted

## 2013-10-25 DIAGNOSIS — I472 Ventricular tachycardia, unspecified: Secondary | ICD-10-CM

## 2013-10-25 HISTORY — DX: Ventricular tachycardia: I47.2

## 2013-10-25 HISTORY — DX: Ventricular tachycardia, unspecified: I47.20

## 2013-10-25 NOTE — Telephone Encounter (Signed)
Pt received shock on 10-25-13 around 1242 am. Spoke w/pt--pt was sleeping before shock. EGM shows AF also. Pt is on Xarelto. Episode will be reviewed by JA on 10-26-13/kwm

## 2013-10-30 ENCOUNTER — Other Ambulatory Visit (INDEPENDENT_AMBULATORY_CARE_PROVIDER_SITE_OTHER): Payer: 59

## 2013-10-30 DIAGNOSIS — I5022 Chronic systolic (congestive) heart failure: Secondary | ICD-10-CM

## 2013-10-30 DIAGNOSIS — I4891 Unspecified atrial fibrillation: Secondary | ICD-10-CM

## 2013-10-30 DIAGNOSIS — I480 Paroxysmal atrial fibrillation: Secondary | ICD-10-CM

## 2013-10-30 LAB — BASIC METABOLIC PANEL
BUN: 14 mg/dL (ref 6–23)
CO2: 25 meq/L (ref 19–32)
CREATININE: 1.1 mg/dL (ref 0.4–1.5)
Calcium: 8.9 mg/dL (ref 8.4–10.5)
Chloride: 104 mEq/L (ref 96–112)
GFR: 70.77 mL/min (ref >60.00)
GLUCOSE: 160 mg/dL — AB (ref 70–99)
Potassium: 4.6 mEq/L (ref 3.5–5.1)
SODIUM: 137 meq/L (ref 135–145)

## 2013-10-31 LAB — DIGOXIN LEVEL: Digoxin Level: 1.2 ng/mL (ref 0.8–2.0)

## 2013-11-14 ENCOUNTER — Ambulatory Visit (HOSPITAL_COMMUNITY)
Admission: RE | Admit: 2013-11-14 | Discharge: 2013-11-14 | Disposition: A | Discharge status: Home / Self Care | Payer: 59 | Source: Ambulatory Visit | Attending: Internal Medicine | Admitting: Internal Medicine

## 2013-11-14 ENCOUNTER — Encounter (HOSPITAL_COMMUNITY): Payer: Self-pay

## 2013-11-14 VITALS — BP 116/68 | HR 79 | Wt 252.4 lb

## 2013-11-14 DIAGNOSIS — E118 Type 2 diabetes mellitus with unspecified complications: Secondary | ICD-10-CM | POA: Insufficient documentation

## 2013-11-14 DIAGNOSIS — R5383 Other fatigue: Secondary | ICD-10-CM | POA: Insufficient documentation

## 2013-11-14 DIAGNOSIS — E039 Hypothyroidism, unspecified: Secondary | ICD-10-CM | POA: Insufficient documentation

## 2013-11-14 DIAGNOSIS — M5135 Other intervertebral disc degeneration, thoracolumbar region: Secondary | ICD-10-CM | POA: Insufficient documentation

## 2013-11-14 DIAGNOSIS — N189 Chronic kidney disease, unspecified: Secondary | ICD-10-CM | POA: Insufficient documentation

## 2013-11-14 DIAGNOSIS — I429 Cardiomyopathy, unspecified: Secondary | ICD-10-CM | POA: Insufficient documentation

## 2013-11-14 DIAGNOSIS — E669 Obesity, unspecified: Secondary | ICD-10-CM | POA: Insufficient documentation

## 2013-11-14 DIAGNOSIS — E785 Hyperlipidemia, unspecified: Secondary | ICD-10-CM | POA: Insufficient documentation

## 2013-11-14 DIAGNOSIS — G4733 Obstructive sleep apnea (adult) (pediatric): Secondary | ICD-10-CM | POA: Diagnosis not present

## 2013-11-14 DIAGNOSIS — I129 Hypertensive chronic kidney disease with stage 1 through stage 4 chronic kidney disease, or unspecified chronic kidney disease: Secondary | ICD-10-CM | POA: Diagnosis not present

## 2013-11-14 DIAGNOSIS — N183 Chronic kidney disease, stage 3 unspecified: Secondary | ICD-10-CM

## 2013-11-14 DIAGNOSIS — D472 Monoclonal gammopathy: Secondary | ICD-10-CM | POA: Insufficient documentation

## 2013-11-14 DIAGNOSIS — I4891 Unspecified atrial fibrillation: Secondary | ICD-10-CM

## 2013-11-14 DIAGNOSIS — F329 Major depressive disorder, single episode, unspecified: Secondary | ICD-10-CM | POA: Diagnosis not present

## 2013-11-14 DIAGNOSIS — Z8673 Personal history of transient ischemic attack (TIA), and cerebral infarction without residual deficits: Secondary | ICD-10-CM | POA: Diagnosis not present

## 2013-11-14 DIAGNOSIS — Z794 Long term (current) use of insulin: Secondary | ICD-10-CM | POA: Insufficient documentation

## 2013-11-14 DIAGNOSIS — Z87891 Personal history of nicotine dependence: Secondary | ICD-10-CM | POA: Insufficient documentation

## 2013-11-14 DIAGNOSIS — I5022 Chronic systolic (congestive) heart failure: Secondary | ICD-10-CM

## 2013-11-14 LAB — BASIC METABOLIC PANEL
Anion gap: 11 (ref 5–15)
BUN: 23 mg/dL (ref 6–23)
CALCIUM: 9.2 mg/dL (ref 8.4–10.5)
CO2: 28 mEq/L (ref 19–32)
Chloride: 98 mEq/L (ref 96–112)
Creatinine, Ser: 1.36 mg/dL — ABNORMAL HIGH (ref 0.50–1.35)
GFR calc non Af Amer: 55 mL/min — ABNORMAL LOW (ref >90)
GFR, EST AFRICAN AMERICAN: 63 mL/min — AB (ref >90)
GLUCOSE: 281 mg/dL — AB (ref 70–99)
Potassium: 4.5 mEq/L (ref 3.7–5.3)
SODIUM: 137 meq/L (ref 137–147)

## 2013-11-14 LAB — PRO B NATRIURETIC PEPTIDE: Pro B Natriuretic peptide (BNP): 1261 pg/mL — ABNORMAL HIGH (ref 0–125)

## 2013-11-14 NOTE — Progress Notes (Signed)
Patient ID: Larry [name], male   DOB: 1950/12/21, 63 y.o.   MRN: [id number] PCP: Dr. Rex Kras Referring MD: Dr. Irish Lack Endocrinologist: Dr Buddy Duty  63 yo with history of chronic systolic CHF (nonischemic cardiomyopathy), CKD, prior CVA, and paroxysmal atrial fibrillation.  Patient says that he has been known to have a low EF for years.  He has a Research officer, political party ICD.  He had LHC in 2009 with nonobstructive disease. Last echo in 3/15 showed moderate LV dilation with EF 25-30%.    Patient had [place] 8/15 showing low cardiac output, mildly elevated PCWP and primarily pulmonary venous hypertension.  He was transitioned from Lasix to torsemide and started on digoxin.  At last appointment, [name] was started and valsartan stopped.    Creatinine rose after this in the setting of hyperglycemia but came back down. Currently, he is symptomatically stable.  Not lightheaded.  No orthopnea/PND.  No chest pain.  He gets fatigued/short of breath walking up steps or walking around in the grocery store.  He has not been able to wear CPAP in the past.  Wife reports apneas at night up to 30 seconds.  He is going to start cardiac rehab soon.   On 9/18, patient's ICD fired.  Per notes, it appears that interrogation showed atrial fibrillation.  He is not in atrial fibrillation today.   ECG: a-paced, v-sensed (versus v-paced also, ?small v-pacing spikes).    Labs (2/15): LDL 103 Labs (3/15): K 4.1, creatinine 1.5, BNP 164 Labs (4/15): K 3.5, creatinine 3.3 => 1.9 => 2.1, BNP 96 Labs (4/15): K 4.3, creatinine 1.43, glucose 308 Labs (5/15): K 5.1 Creatinine 1.35  Labs (8/15): K 5 => 4.7 => 3.8, creatinine 1.19 => 1.57 => 1.6, digoxin 1.1 Labs (9/15): K 4.6, creatinine 1.32 => 1.9 => 1.1, digoxin 0.6 => 1.2 (not trough)  PMH: 1. Chronic systolic CHF: Nonischemic cardiomyopathy.  Most recent echo in 3/15 with EF 25-30%, moderate LV dilation, mild LVH, moderate diastolic dysfunction, IVC normal, mildly decreased RV systolic  function.  Prior echo in 2011 also with EF 25-30%.  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  St Jude ICD. CPX 05/2013: FVC 3.89 (82%), FEV1 2.99 (83%), FEV1/FVC 77%, Peak HR 118 (74% predicted max), Peak VO2 12.4 (51% of predicted; when corrected to IBW =17.7), VE/VCO2 29.9, OUES 1.39, Peak RER 1.21.  RHC (8/15) with mean RA 6, PA 60/21 mean 36, mean PCWP 21, CI 1.81, PVR 3.6 WU.  2. Type II diabetes 3. CKD 4. Monoclonal gammopathy of uncertain etiology.  5. CVA 2007 6. Degenerative disc disease.  7. Pituitary microadenoma: Nonfunctioning, s/p surgery in 2009.  8. OSA: Unable to tolerate Bipap/CPAP. 9. Depression 10. Hypothyroidism 11. Hyperlipidemia 12. Atrial fibrillation: Paroxysmal.  Patient is on Xarelto.   SH: Married, former cigar smoker, unemployed.   FH: Father with MI x 2 and CHF, strong history of CAD on his father's side.   ROS: All systems reviewed and negative except as per HPI.   Current Outpatient Prescriptions  Medication Sig Dispense Refill  . ALPRAZolam (XANAX) 0.5 MG tablet Take 0.5 mg by mouth 3 (three) times daily as needed for anxiety.       [name] atorvastatin (LIPITOR) 40 MG tablet Take 1 tablet (40 mg total) by mouth daily.  90 tablet  1  . digoxin (LANOXIN) 0.125 MG tablet Take 1 tablet (0.125 mg total) by mouth daily.  90 tablet  1  . DULoxetine (CYMBALTA) 30 MG capsule Take 60 mg by mouth 2 (  two) times daily.       . insulin aspart (NOVOLOG) 100 UNIT/ML injection Inject 60 Units into the skin 3 (three) times daily before meals.      . metoprolol succinate (TOPROL-XL) 100 MG 24 hr tablet Take 100 mg by mouth daily. 100 mg in am and 50 mg in pm      . Multiple Vitamin (MULITIVITAMIN WITH MINERALS) TABS Take 1 tablet by mouth daily.      [name] oxyCODONE-acetaminophen (PERCOCET) 10-325 MG per tablet Take 1 tablet by mouth every 4 (four) hours as needed for pain. For pain      . rivaroxaban (XARELTO) 15 MG TABS tablet Take 1 tablet (15 mg total) by mouth daily with supper.  30  tablet    . Sacubitril-Valsartan (ENTRESTO) 24-26 MG TABS Take 1 tablet by mouth 2 (two) times daily.  60 tablet  6  . senna (SENOKOT) 8.6 MG tablet Take 1 tablet by mouth 2 (two) times daily.      [name] spironolactone (ALDACTONE) 25 MG tablet Take 1 tablet (25 mg total) by mouth daily.  30 tablet  6  . torsemide (DEMADEX) 20 MG tablet Take 1 tablet (20 mg total) by mouth daily.      [name] zolpidem (AMBIEN) 10 MG tablet Take 10 mg by mouth at bedtime as needed for sleep. For sleep       No current facility-administered medications for this encounter.    Filed Vitals:   11/14/13 0856  BP: 116/68  Pulse: 79  Weight: 252 lb 6.4 oz (114.488 kg)  SpO2: 94%   General: NAD Wife present  Neck: Thick, JVP difficult to assess d/t body habitus but does not appear elevated. No thyromegaly or thyroid nodule.  Lungs: Clear to auscultation bilaterally with normal respiratory effort. CV: Nondisplaced PMI.  Heart regular S1/S2, no S3/S4, no murmur.  No peripheral edema.  No carotid bruit.  Normal pedal pulses.  Abdomen: Obese, Soft, tender to palpation, obese, no hepatosplenomegaly, non-distended Skin: Intact without lesions or rashes.  Neurologic: Alert and oriented x 3.  Psych: Flat affect. Extremities: No clubbing or cyanosis.   Assessment/plan: 1. Chronic systolic CHF: Nonischemic cardiomyopathy, known for years.  EF has been stable at 25-30% (04/2013). Has ST Jude ICD, narrow QRS so not CRT candidate. CPX VO2 low but slope ok.  NYHA class III symptoms (some improvement with torsemide).  RHC with low cardiac output, started on digoxin.  Today, he is not significantly volume overloaded on exam but continues to have have significant fatigue. - Continue torsemide 40 mg daily, BMET today.  - Continue digoxin give low output on RHC.  Last digoxin level 1.2 but not a trough.  - Continue Toprol XL 100 qam/50 qpm and current spironolactone. - Continue Entresto, if creatinine is stable today will increase Entresto.    - We have discussed the fact that he may be nearing the need for advanced therapies.  Weight will be an issue for transplant.  Will see how he does with med adjustments but may need to move towards LVAD vs transplant in near future.  - It is hard to tell today if he is v-paced or not (probably not).  He sees Dr Rayann Heman Monday for ICD followup.  Will need to see if he is having a significant amount of RV pacing.  - Reinforced the need and importance of daily weights, a low sodium diet, and fluid restriction (less than 2 L a day). Instructed to call the HF clinic  if weight increases more than 3 lbs overnight or 5 lbs in a week.  2. CKD: Most recent creatinine stable after rise, will repeat today.  3. Atrial fibrillation: Paroxysmal.  Continue Xarelto 15 mg daily. He is not in atrial fibrillation today though it appears that he had a run of atrial fibrillation in 9/15 that triggered an ICD discharge.  He will see Dr Rayann Heman on Monday, may need reprogramming for better discrimination.  4. Obesity:  Needs to lose weight.  Asked him to cut back on his portions and try low carb diet.  He is going to do cardiac rehab.  5. DM2: Continue to follow with endocrinology.  Glucose control still not ideal.  6. Depression: Seems to be ongoing issue.  7. Fatigue: In addition to CHF, I think that OSA (that is untreated as he is unable to wear CPAP) and depression play a large role. He has an appointment to go back to pulmonary to see if anything can be done for OSA (30 second apneas per wife).    Followup in 2 months.   Loralie Champagne 11/14/2013

## 2013-11-14 NOTE — Patient Instructions (Signed)
Continue current medications  Labs today, we will call you with results  Your physician recommends that you schedule a follow-up appointment in: 2 months

## 2013-11-15 ENCOUNTER — Encounter (HOSPITAL_COMMUNITY): Payer: Self-pay | Admitting: Cardiology

## 2013-11-15 ENCOUNTER — Inpatient Hospital Stay (HOSPITAL_COMMUNITY): Admission: RE | Admit: 2013-11-15 | Payer: 59 | Source: Ambulatory Visit

## 2013-11-15 NOTE — Addendum Note (Signed)
Encounter addended by: Asencion Gowda, CCT on: 11/15/2013 11:49 AM<BR>     Documentation filed: Charges VN

## 2013-11-16 ENCOUNTER — Ambulatory Visit (INDEPENDENT_AMBULATORY_CARE_PROVIDER_SITE_OTHER): Payer: 59 | Admitting: Pulmonary Disease

## 2013-11-16 ENCOUNTER — Encounter: Payer: Self-pay | Admitting: Pulmonary Disease

## 2013-11-16 ENCOUNTER — Other Ambulatory Visit: Payer: Self-pay

## 2013-11-16 VITALS — BP 122/70 | HR 64 | Temp 98.3°F | Ht 70.5 in | Wt 257.0 lb

## 2013-11-16 DIAGNOSIS — G4733 Obstructive sleep apnea (adult) (pediatric): Secondary | ICD-10-CM

## 2013-11-16 NOTE — Progress Notes (Signed)
Chief Complaint  Patient presents with  . SLEEP CONSULT    Referred by Dr Aundra Dubin. Sleep study x 7 years ago. Tried and failed CPAP--claustrophic. Epworth Score: 6    History of Present Illness: Larry [name] is a 63 y.o. male for evaluation of sleep problems.  She is followed by cardiology for ischemic cardiomyopathy with systolic heart failure.  She had a sleep study done about 6 years ago which showed sleep apnea.  She was tried on CPAP, but was not able to tolerate this.  As a result she has not been on therapy for sleep apnea for several years.  Her cardiologist was concerned about how her sleep disordered breathing could be affecting her heart function, and she was advised to have further assessment.  She snores, and has been told she stops breathing while asleep.  She will have to sometimes sleep in a recliner.  She can fall asleep when sitting quiet.  He goes to sleep at 11 pm.  He falls asleep after 20 minutes.  He wakes up two times to use the bathroom.  He gets out of bed at 9 am.  He feels tired in the morning.  He denies morning headache.  He does not use anything to help him stay awake.  She will take xanax and ambien before going to bed.  He denies sleep walking, sleep talking, bruxism, or nightmares.  There is no history of restless legs.  He denies sleep hallucinations, sleep paralysis, or cataplexy.  The Epworth score is 6 out of 24.  Tests: Echo 05/02/13 >> EF 25 to 93%, grade 2 diastolic dysfx  Larry [name]  has a past medical history of Cardiomyopathy; CAD (coronary artery disease); Obesity; CVA (cerebral vascular accident); Pituitary tumor; Depression; Erectile dysfunction; DDD (degenerative disc disease); OSA (obstructive sleep apnea); Monoclonal gammopathy; CRI (chronic renal insufficiency); CKD (chronic kidney disease), stage III; Polyneuropathy in diabetes(357.2); Hypogonadotropic hypogonadism; Subclinical hypothyroidism; Polycythemia, secondary;  Hypercholesterolemia; Back pain; Monoclonal gammopathy of unknown significance; CHF (congestive heart failure); Neck pain; PAF (paroxysmal atrial fibrillation) (06/04/10); History of diverticulitis of colon; Type II or unspecified type diabetes mellitus with neurological manifestations, uncontrolled (2000); and Nonproliferative diabetic retinopathy NOS(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  Larry [name]  has past surgical history that includes gamma knife surgery for pituitary tumor; Tonsillectomy; Back surgery; and Cardiac defibrillator placement (02/19/10).  Prior to Admission medications   Medication Sig Start Date End Date Taking? Authorizing Provider  ALPRAZolam Duanne Moron) 0.5 MG tablet Take 0.5 mg by mouth 3 (three) times daily as needed for anxiety.  02/18/12  Yes Historical Provider, MD  atorvastatin (LIPITOR) 40 MG tablet Take 1 tablet (40 mg total) by mouth daily. 10/11/13  Yes [name], MD  digoxin (LANOXIN) 0.125 MG tablet Take 1 tablet (0.125 mg total) by mouth daily. 10/11/13  Yes [name], MD  DULoxetine (CYMBALTA) 30 MG capsule Take 60 mg by mouth 2 (two) times daily.    Yes Historical Provider, MD  insulin aspart (NOVOLOG) 100 UNIT/ML injection Inject 60 Units into the skin 3 (three) times daily before meals.   Yes Historical Provider, MD  metoprolol succinate (TOPROL-XL) 100 MG 24 hr tablet 100 mg. 100 mg in am and 50 mg in pm 07/04/13  Yes Rande Brunt, NP  Multiple Vitamin (MULITIVITAMIN WITH MINERALS) TABS Take 1 tablet by mouth daily.   Yes Historical Provider, MD  oxyCODONE-acetaminophen (PERCOCET) 10-325 MG per tablet Take 1 tablet by mouth every 4 (four) hours as needed for pain.  For pain   Yes Historical Provider, MD  rivaroxaban (XARELTO) 15 MG TABS tablet Take 1 tablet (15 mg total) by mouth daily with supper. 05/31/13  Yes [name], MD  Sacubitril-Valsartan (ENTRESTO) 24-26 MG TABS Take 1 tablet by mouth 2 (two) times daily. 10/09/13  Yes [name], MD  senna (SENOKOT) 8.6 MG tablet  Take 1 tablet by mouth 2 (two) times daily.   Yes Historical Provider, MD  spironolactone (ALDACTONE) 25 MG tablet Take 1 tablet (25 mg total) by mouth daily. 10/09/13  Yes [name], MD  torsemide (DEMADEX) 20 MG tablet Take 1 tablet (20 mg total) by mouth daily. 10/23/13  Yes [name], MD  zolpidem (AMBIEN) 10 MG tablet Take 10 mg by mouth at bedtime as needed for sleep. For sleep   Yes Historical Provider, MD    Allergies  Allergen Reactions  . Bydureon [Exenatide] Other (See Comments)    sweating  . Losartan Potassium Other (See Comments)    insomnia    His family history includes Cancer in his mother; Dementia in his mother; Depression in his mother; Diabetes in his brother and paternal uncle; Heart disease in his brother and father; Hypertension in his brother, father, and sister; Lung cancer in his father, paternal uncle, and another family member; Obesity in his brother, father, and sister.  He  reports that he quit smoking about 30 years ago. His smoking use included Cigars. He does not have any smokeless tobacco history on file. He reports that he does not drink alcohol or use illicit drugs.  Review of Systems  Constitutional: Negative for fever and unexpected weight change.  HENT: Positive for dental problem. Negative for congestion, ear pain, nosebleeds, postnasal drip, rhinorrhea, sinus pressure, sneezing, sore throat and trouble swallowing.   Eyes: Negative for redness and itching.  Respiratory: Positive for shortness of breath. Negative for cough, chest tightness and wheezing.   Cardiovascular: Negative for palpitations and leg swelling.  Gastrointestinal: Negative for nausea and vomiting.  Genitourinary: Negative for dysuria.  Musculoskeletal: Positive for arthralgias and joint swelling.  Skin: Negative for rash.  Neurological: Negative for headaches.  Hematological: Does not bruise/bleed easily.  Psychiatric/Behavioral: Positive for dysphoric mood. The patient  is nervous/anxious.    Physical Exam:  General - No distress ENT - No sinus tenderness, no oral exudate, no LAN, no thyromegaly, TM clear, pupils equal/reactive, poor dentition, MP 3, scalloped tongue Cardiac - s1s2 regular, no murmur, pulses symmetric Chest - No wheeze/rales/dullness, good air entry, normal respiratory excursion Back - No focal tenderness Abd - Soft, non-tender, no organomegaly, + bowel sounds Ext - No edema Neuro - Normal strength, cranial nerves intact Skin - No rashes Psych - Normal mood, and behavior  Assessment/plan:  Chesley Mires, M.D. Pager [phone]

## 2013-11-16 NOTE — Progress Notes (Deleted)
   Subjective:    Patient ID: Larry [name], male    DOB: November 20, 1950, 63 y.o.   MRN: [id number]  HPI    Review of Systems  Constitutional: Negative for fever and unexpected weight change.  HENT: Positive for dental problem. Negative for congestion, ear pain, nosebleeds, postnasal drip, rhinorrhea, sinus pressure, sneezing, sore throat and trouble swallowing.   Eyes: Negative for redness and itching.  Respiratory: Positive for shortness of breath. Negative for cough, chest tightness and wheezing.   Cardiovascular: Negative for palpitations and leg swelling.  Gastrointestinal: Negative for nausea and vomiting.  Genitourinary: Negative for dysuria.  Musculoskeletal: Positive for arthralgias and joint swelling.  Skin: Negative for rash.  Neurological: Negative for headaches.  Hematological: Does not bruise/bleed easily.  Psychiatric/Behavioral: Positive for dysphoric mood. The patient is nervous/anxious.        Objective:   Physical Exam        Assessment & Plan:

## 2013-11-16 NOTE — Patient Instructions (Signed)
Will arrange for sleep study Will call to arrange for follow up after sleep study reviewed 

## 2013-11-18 DIAGNOSIS — G4733 Obstructive sleep apnea (adult) (pediatric): Secondary | ICD-10-CM | POA: Insufficient documentation

## 2013-11-18 NOTE — Assessment & Plan Note (Signed)
She has snoring, sleep disruption, witnessed apnea, and daytime sleepiness.  She has hx of CAD, HTN, a fib, and systolic heart failure.  She has prior history of sleep apnea.  She has gained weight since her original sleep study.  I am concerned that she still has obstructive sleep apnea, and could also have central sleep apnea in the setting of systolic heart failure.  We discussed how sleep apnea can affect various health problems including risks for hypertension, cardiovascular disease, and diabetes.  We also discussed how sleep disruption can increase risks for accident, such as while driving.  Weight loss as a means of improving sleep apnea was also reviewed.  Additional treatment options discussed were CPAP therapy, oral appliance, and surgical intervention.  To further assess will arrange for in lab sleep study.

## 2013-11-19 ENCOUNTER — Encounter: Payer: Self-pay | Admitting: Internal Medicine

## 2013-11-19 ENCOUNTER — Ambulatory Visit (INDEPENDENT_AMBULATORY_CARE_PROVIDER_SITE_OTHER): Payer: 59 | Admitting: Internal Medicine

## 2013-11-19 VITALS — BP 120/70 | HR 66 | Ht 70.0 in | Wt 251.0 lb

## 2013-11-19 DIAGNOSIS — I5022 Chronic systolic (congestive) heart failure: Secondary | ICD-10-CM

## 2013-11-19 DIAGNOSIS — Z95810 Presence of automatic (implantable) cardiac defibrillator: Secondary | ICD-10-CM

## 2013-11-19 DIAGNOSIS — G4733 Obstructive sleep apnea (adult) (pediatric): Secondary | ICD-10-CM

## 2013-11-19 DIAGNOSIS — I472 Ventricular tachycardia, unspecified: Secondary | ICD-10-CM

## 2013-11-19 DIAGNOSIS — E669 Obesity, unspecified: Secondary | ICD-10-CM

## 2013-11-19 DIAGNOSIS — I480 Paroxysmal atrial fibrillation: Secondary | ICD-10-CM

## 2013-11-19 LAB — MDC_IDC_ENUM_SESS_TYPE_INCLINIC
Brady Statistic RA Percent Paced: 74 %
Date Time Interrogation Session: 20151012155610
HIGH POWER IMPEDANCE MEASURED VALUE: 56.25 Ohm
Implantable Pulse Generator Serial Number: 622169
Lead Channel Impedance Value: 337.5 Ohm
Lead Channel Pacing Threshold Amplitude: 0.75 V
Lead Channel Pacing Threshold Amplitude: 1 V
Lead Channel Pacing Threshold Amplitude: 1 V
Lead Channel Pacing Threshold Pulse Width: 0.5 ms
Lead Channel Pacing Threshold Pulse Width: 0.5 ms
Lead Channel Sensing Intrinsic Amplitude: 11.3 mV
Lead Channel Sensing Intrinsic Amplitude: 3.5 mV
Lead Channel Setting Sensing Sensitivity: 0.5 mV
MDC IDC MSMT BATTERY REMAINING LONGEVITY: 56.4 mo
MDC IDC MSMT LEADCHNL RA IMPEDANCE VALUE: 425 Ohm
MDC IDC MSMT LEADCHNL RA PACING THRESHOLD AMPLITUDE: 0.75 V
MDC IDC MSMT LEADCHNL RA PACING THRESHOLD PULSEWIDTH: 0.5 ms
MDC IDC MSMT LEADCHNL RV PACING THRESHOLD PULSEWIDTH: 0.5 ms
MDC IDC SET LEADCHNL RA PACING AMPLITUDE: 2 V
MDC IDC SET LEADCHNL RV PACING AMPLITUDE: 2.5 V
MDC IDC SET LEADCHNL RV PACING PULSEWIDTH: 0.5 ms
MDC IDC STAT BRADY RV PERCENT PACED: 20 %
Zone Setting Detection Interval: 270 ms
Zone Setting Detection Interval: 335 ms

## 2013-11-19 NOTE — Patient Instructions (Signed)
Your physician wants you to follow-up in: 6 months with Dr Vallery Ridge will receive a reminder letter in the mail two months in advance. If you don't receive a letter, please call our office to schedule the follow-up appointment.  Remote monitoring is used to monitor your Pacemaker or ICD from home. This monitoring reduces the number of office visits required to check your device to one time per year. It allows [place] to keep an eye on the functioning of your device to ensure it is working properly. You are scheduled for a device check from home on 02/20/14. You may send your transmission at any time that day. If you have a wireless device, the transmission will be sent automatically. After your physician reviews your transmission, you will receive a postcard with your next transmission date.

## 2013-11-19 NOTE — Progress Notes (Signed)
PCP: [name], MD Primary Cardiologist: Irish Lack Also seen by Dr Aundra Dubin in the CHF clinic  Larry [name] is a 63 y.o. male who presents today for electrophysiology followup.  Since last being seen in our clinic, the patient reports doing reasonably  well.  His energy level remains depressed.  He has been evaluated by CHF clinic and has sleep study planned.  His CHF continues to be an issue.  He was prescribed entresto but could not take this medicine due to insurance coverage issues.  He is starting cardiac rehab soon and is excited about this.  SOB remains his primary concern. He did have an ICD shock 10/25/13 which woke him from sleep.  He reports feeling well at the time.  He has had no arrhythmias or shocks since that time. Today, he denies symptoms of palpitations or chest pain..  The patient is otherwise without complaint today.   Past Medical History  Diagnosis Date  . Cardiomyopathy     nonischemic (EF 25%)  . CAD (coronary artery disease)     nonobstructive CAD by cath 2009  . Obesity   . CVA (cerebral vascular accident)     CVA 2007 without residual deficit  . Pituitary tumor      (nonfunctionging pituitary microadenoma) s/p gamma knife surgery at [facility] 2009 with neuropathy and retinopathy  . Depression   . Erectile dysfunction   . DDD (degenerative disc disease)   . OSA (obstructive sleep apnea)   . Monoclonal gammopathy   . CRI (chronic renal insufficiency)     CRI (baseline creatinine 1.6)  . CKD (chronic kidney disease), stage III     moderate  . 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 Hypogonadotropic hypogonadism   . Subclinical hypothyroidism   . Polycythemia, secondary     improved/resolved  . Hypercholesterolemia   . Back pain     F/B [name]  . Monoclonal gammopathy of unknown significance     per [name]  . 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 Neck pain     F/B [name]  . PAF (paroxysmal  atrial fibrillation) 06/04/10    Hx of, has device  . History of diverticulitis of colon   . Type II or unspecified type diabetes mellitus with neurological manifestations, uncontrolled 2000  . Nonproliferative diabetic retinopathy RKY(706.23)    Past Surgical History  Procedure Laterality Date  . Gamma knife surgery for pituitary tumor    . Tonsillectomy    . Back surgery    . Cardiac defibrillator placement  02/19/10    By JA.     Current Outpatient Prescriptions  Medication Sig Dispense Refill  . ALPRAZolam (XANAX) 0.5 MG tablet Take 0.5 mg by mouth 3 (three) times daily as needed for anxiety.       [name] atorvastatin (LIPITOR) 40 MG tablet Take 1 tablet (40 mg total) by mouth daily.  90 tablet  1  . digoxin (LANOXIN) 0.125 MG tablet Take 1 tablet (0.125 mg total) by mouth daily.  90 tablet  1  . DULoxetine (CYMBALTA) 30 MG capsule Take 60 mg by mouth 2 (two) times daily.       . insulin aspart (NOVOLOG) 100 UNIT/ML injection Inject 60 Units into the skin 3 (three) times daily before meals.      [name] LANTUS SOLOSTAR 100 UNIT/ML Solostar Pen Inject 60 Units into the skin daily.      . metoprolol succinate (  TOPROL-XL) 100 MG 24 hr tablet 100 mg in am and 50 mg in pm      . Multiple Vitamin (MULITIVITAMIN WITH MINERALS) TABS Take 1 tablet by mouth daily.      [name] oxyCODONE-acetaminophen (PERCOCET) 10-325 MG per tablet Take 1 tablet by mouth every 4 (four) hours as needed for pain. For pain      . rivaroxaban (XARELTO) 15 MG TABS tablet Take 1 tablet (15 mg total) by mouth daily with supper.  30 tablet    . senna (SENOKOT) 8.6 MG tablet Take 1 tablet by mouth 2 (two) times daily.      [name] spironolactone (ALDACTONE) 25 MG tablet Take 1 tablet (25 mg total) by mouth daily.  30 tablet  6  . torsemide (DEMADEX) 20 MG tablet Take 1 tablet (20 mg total) by mouth daily.      [name] zolpidem (AMBIEN) 10 MG tablet Take 10 mg by mouth at bedtime as needed for sleep. For sleep      . Sacubitril-Valsartan (ENTRESTO)  24-26 MG TABS Take 1 tablet by mouth 2 (two) times daily.  60 tablet  6   No current facility-administered medications for this visit.    Physical Exam: Filed Vitals:   11/19/13 1142  BP: 120/70  Pulse: 66  Height: 5\' 10"  (1.778 m)  Weight: 251 lb (113.853 kg)    GEN- The patient is well appearing, alert and oriented x 3 today.   Head- normocephalic, atraumatic Eyes-  Sclera clear, conjunctiva pink Ears- hearing intact Oropharynx- clear Lungs- Clear to ausculation bilaterally, normal work of breathing Chest- ICD pocket is well healed Heart- Regular rate and rhythm, no murmurs, rubs or gallops, PMI not laterally displaced GI- soft, NT, ND, + BS Extremities- no clubbing, cyanosis, or edema  ICD interrogation- reviewed in detail today,  See PACEART report ekg today reveals atrial pacing at 66 bpm, PR 282, QRS 102, Qtc 429  Assessment and Plan:  1.  Chronic systolic dysfunction euvolemic today Stable on an appropriate medical regimen Normal ICD function  See Pace Art report No changes today V pacing is 20%  2.  Hypertension Stable No change required today  3.  Atrial fibrillation Continue xarelto for stroke prevention (CHADS2VASC score is at least 5)  4. VT- new diagnosis ICD interrogation from 10/25/13 reveals that he had underyling Afib but did also have VT with CL of 242 msec for which he was appropriately converted to sinus rhythm with a single 25J shock delivered No driving x 6 months (pt aware)   5. Morbid obesity Body mass index is 36.01 kg/(m^2). Weight loss is advised  Merlin Return in 6 months

## 2013-11-21 ENCOUNTER — Encounter (HOSPITAL_COMMUNITY): Payer: 59

## 2013-11-22 ENCOUNTER — Encounter: Payer: Self-pay | Admitting: Internal Medicine

## 2013-11-23 ENCOUNTER — Other Ambulatory Visit (HOSPITAL_COMMUNITY): Payer: Self-pay

## 2013-11-23 ENCOUNTER — Encounter (HOSPITAL_COMMUNITY): Payer: 59

## 2013-11-23 ENCOUNTER — Telehealth (HOSPITAL_COMMUNITY): Payer: Self-pay

## 2013-11-23 MED ORDER — SACUBITRIL-VALSARTAN 49-51 MG PO TABS: 1.0000 | ORAL_TABLET | Freq: Two times a day (BID) | ORAL | Status: DC

## 2013-11-23 NOTE — Telephone Encounter (Signed)
Left detailed message for patient to increase entresto dose per Dr. Aundra Dubin and that this was sent to pharmacy.  Asked to return call if he has any further questions. Renee Pain

## 2013-11-26 NOTE — Telephone Encounter (Signed)
Open in error

## 2013-11-28 ENCOUNTER — Encounter (HOSPITAL_COMMUNITY): Payer: 59

## 2013-11-29 ENCOUNTER — Encounter (HOSPITAL_COMMUNITY)
Admission: RE | Admit: 2013-11-29 | Discharge: 2013-11-29 | Disposition: A | Discharge status: Home / Self Care | Payer: 59 | Source: Ambulatory Visit | Attending: Cardiology | Admitting: Cardiology

## 2013-11-29 DIAGNOSIS — F329 Major depressive disorder, single episode, unspecified: Secondary | ICD-10-CM | POA: Insufficient documentation

## 2013-11-29 DIAGNOSIS — E118 Type 2 diabetes mellitus with unspecified complications: Secondary | ICD-10-CM | POA: Insufficient documentation

## 2013-11-29 DIAGNOSIS — R5383 Other fatigue: Secondary | ICD-10-CM | POA: Insufficient documentation

## 2013-11-29 DIAGNOSIS — Z5189 Encounter for other specified aftercare: Secondary | ICD-10-CM | POA: Insufficient documentation

## 2013-11-29 DIAGNOSIS — E039 Hypothyroidism, unspecified: Secondary | ICD-10-CM | POA: Insufficient documentation

## 2013-11-29 DIAGNOSIS — Z6836 Body mass index (BMI) 36.0-36.9, adult: Secondary | ICD-10-CM | POA: Insufficient documentation

## 2013-11-29 DIAGNOSIS — I5022 Chronic systolic (congestive) heart failure: Secondary | ICD-10-CM | POA: Insufficient documentation

## 2013-11-29 DIAGNOSIS — Z87891 Personal history of nicotine dependence: Secondary | ICD-10-CM | POA: Insufficient documentation

## 2013-11-29 DIAGNOSIS — Z95810 Presence of automatic (implantable) cardiac defibrillator: Secondary | ICD-10-CM | POA: Insufficient documentation

## 2013-11-29 DIAGNOSIS — Z8673 Personal history of transient ischemic attack (TIA), and cerebral infarction without residual deficits: Secondary | ICD-10-CM | POA: Insufficient documentation

## 2013-11-29 DIAGNOSIS — E785 Hyperlipidemia, unspecified: Secondary | ICD-10-CM | POA: Insufficient documentation

## 2013-11-29 DIAGNOSIS — I472 Ventricular tachycardia: Secondary | ICD-10-CM | POA: Insufficient documentation

## 2013-11-29 DIAGNOSIS — I429 Cardiomyopathy, unspecified: Secondary | ICD-10-CM | POA: Insufficient documentation

## 2013-11-29 DIAGNOSIS — Z7901 Long term (current) use of anticoagulants: Secondary | ICD-10-CM | POA: Insufficient documentation

## 2013-11-29 DIAGNOSIS — N189 Chronic kidney disease, unspecified: Secondary | ICD-10-CM | POA: Insufficient documentation

## 2013-11-29 DIAGNOSIS — I4891 Unspecified atrial fibrillation: Secondary | ICD-10-CM | POA: Insufficient documentation

## 2013-11-29 NOTE — Progress Notes (Signed)
Cardiac Rehab Medication Review by a Pharmacist  Does the patient  feel that his/her medications are working for him/her?  yes  Has the patient been experiencing any side effects to the medications prescribed?  Yes - hypoglycemia with associated sweating and lightheadedness.   Does the patient measure his/her own blood pressure or blood glucose at home?  yes   Does the patient have any problems obtaining medications due to transportation or finances?  no  Understanding of regimen: fair Understanding of indications: fair Potential of compliance: fair   Pharmacist comments: Patient feels that his medications are working fine for him and he has no questions or concerns.  Patient admitted to having blood glucose levels in the 500 recently, where he was hospitalized for dehydration.  He also says he has a few episodes of hypoglycemia with associated lightheadedness and sweating.  Patient has no barriers to obtaining his medications and measures his blood pressure and blood glucose at home.   Cassie L. Nicole Kindred, PharmD Clinical Pharmacy Resident Pager: [phone] 11/29/2013 8:22 AM

## 2013-11-30 ENCOUNTER — Encounter (HOSPITAL_COMMUNITY): Payer: 59

## 2013-12-03 ENCOUNTER — Encounter (HOSPITAL_COMMUNITY)
Admission: RE | Admit: 2013-12-03 | Discharge: 2013-12-03 | Disposition: A | Discharge status: Home / Self Care | Payer: 59 | Source: Ambulatory Visit | Attending: Cardiology | Admitting: Cardiology

## 2013-12-03 DIAGNOSIS — E039 Hypothyroidism, unspecified: Secondary | ICD-10-CM | POA: Diagnosis not present

## 2013-12-03 DIAGNOSIS — I472 Ventricular tachycardia: Secondary | ICD-10-CM | POA: Diagnosis not present

## 2013-12-03 DIAGNOSIS — Z95810 Presence of automatic (implantable) cardiac defibrillator: Secondary | ICD-10-CM | POA: Diagnosis not present

## 2013-12-03 DIAGNOSIS — Z5189 Encounter for other specified aftercare: Secondary | ICD-10-CM | POA: Diagnosis present

## 2013-12-03 DIAGNOSIS — Z7901 Long term (current) use of anticoagulants: Secondary | ICD-10-CM | POA: Diagnosis not present

## 2013-12-03 DIAGNOSIS — E118 Type 2 diabetes mellitus with unspecified complications: Secondary | ICD-10-CM | POA: Diagnosis not present

## 2013-12-03 DIAGNOSIS — I429 Cardiomyopathy, unspecified: Secondary | ICD-10-CM | POA: Diagnosis not present

## 2013-12-03 DIAGNOSIS — R5383 Other fatigue: Secondary | ICD-10-CM | POA: Diagnosis not present

## 2013-12-03 DIAGNOSIS — Z87891 Personal history of nicotine dependence: Secondary | ICD-10-CM | POA: Diagnosis not present

## 2013-12-03 DIAGNOSIS — Z6836 Body mass index (BMI) 36.0-36.9, adult: Secondary | ICD-10-CM | POA: Diagnosis not present

## 2013-12-03 DIAGNOSIS — F329 Major depressive disorder, single episode, unspecified: Secondary | ICD-10-CM | POA: Diagnosis not present

## 2013-12-03 DIAGNOSIS — Z8673 Personal history of transient ischemic attack (TIA), and cerebral infarction without residual deficits: Secondary | ICD-10-CM | POA: Diagnosis not present

## 2013-12-03 DIAGNOSIS — I5022 Chronic systolic (congestive) heart failure: Secondary | ICD-10-CM | POA: Diagnosis not present

## 2013-12-03 DIAGNOSIS — N189 Chronic kidney disease, unspecified: Secondary | ICD-10-CM | POA: Diagnosis not present

## 2013-12-03 DIAGNOSIS — E785 Hyperlipidemia, unspecified: Secondary | ICD-10-CM | POA: Diagnosis not present

## 2013-12-03 DIAGNOSIS — I4891 Unspecified atrial fibrillation: Secondary | ICD-10-CM | POA: Diagnosis not present

## 2013-12-03 LAB — GLUCOSE, CAPILLARY
Glucose-Capillary: 191 mg/dL — ABNORMAL HIGH (ref 70–99)
Glucose-Capillary: 137 mg/dL — ABNORMAL HIGH (ref 70–99)

## 2013-12-03 NOTE — Progress Notes (Addendum)
Pt started cardiac rehab today.  Pt tolerated light exercise without difficulty. Telemetry rhythm Sinus with a first degree heart block. Vital sign stable. Will continue to monitor the patient throughout  the program. Larry [name]'s PHQ score =1. Larry [name] says he had some depression but it is controlled with the antidepressant medication he takes on a daily basis. Larry [name] did complain of feeling tired while walking the track today. Patient encouraged to take sitting rest breaks as needed. Patient denied feeling dizzy this morning. Oxygen saturation 97% on room air. Larry [name]'s short and long term goals are to lose weight and to learn about his heart.

## 2013-12-05 ENCOUNTER — Encounter (HOSPITAL_COMMUNITY): Payer: 59

## 2013-12-05 ENCOUNTER — Encounter (HOSPITAL_COMMUNITY)
Admission: RE | Admit: 2013-12-05 | Discharge: 2013-12-05 | Disposition: A | Discharge status: Home / Self Care | Payer: 59 | Source: Ambulatory Visit | Attending: Cardiology | Admitting: Cardiology

## 2013-12-05 DIAGNOSIS — Z5189 Encounter for other specified aftercare: Secondary | ICD-10-CM | POA: Diagnosis not present

## 2013-12-05 LAB — GLUCOSE, CAPILLARY
Glucose-Capillary: 195 mg/dL — ABNORMAL HIGH (ref 70–99)
Glucose-Capillary: 190 mg/dL — ABNORMAL HIGH (ref 70–99)

## 2013-12-05 NOTE — Progress Notes (Signed)
Reviewed Larry [name]'s quality of life questionnaire he scored low in all areas.  Larry [name] he is dissatisfied with his health due to his heart and back problems. Larry [name] says he is not depressed and is seeing a psychiatrist. Larry [name] agreed to talk with out hospital chaplain. Appointment made for the patient to see Jeanella Craze on November 5 th. Permission granted by the patient to forward his quality of life questionnaire to Dr Ace Gins.

## 2013-12-07 ENCOUNTER — Encounter (HOSPITAL_COMMUNITY)
Admission: RE | Admit: 2013-12-07 | Discharge: 2013-12-07 | Disposition: A | Discharge status: Home / Self Care | Payer: 59 | Source: Ambulatory Visit | Attending: Cardiology | Admitting: Cardiology

## 2013-12-07 ENCOUNTER — Encounter (HOSPITAL_COMMUNITY): Payer: 59

## 2013-12-07 DIAGNOSIS — Z5189 Encounter for other specified aftercare: Secondary | ICD-10-CM | POA: Diagnosis not present

## 2013-12-07 LAB — GLUCOSE, CAPILLARY: Glucose-Capillary: 161 mg/dL — ABNORMAL HIGH (ref 70–99)

## 2013-12-10 ENCOUNTER — Encounter (HOSPITAL_COMMUNITY)
Admission: RE | Admit: 2013-12-10 | Discharge: 2013-12-10 | Disposition: A | Discharge status: Home / Self Care | Payer: 59 | Source: Ambulatory Visit | Attending: Cardiology | Admitting: Cardiology

## 2013-12-10 ENCOUNTER — Other Ambulatory Visit (HOSPITAL_COMMUNITY): Payer: Self-pay | Admitting: Cardiology

## 2013-12-10 DIAGNOSIS — I5022 Chronic systolic (congestive) heart failure: Secondary | ICD-10-CM | POA: Insufficient documentation

## 2013-12-10 DIAGNOSIS — Z87891 Personal history of nicotine dependence: Secondary | ICD-10-CM | POA: Insufficient documentation

## 2013-12-10 DIAGNOSIS — I472 Ventricular tachycardia: Secondary | ICD-10-CM | POA: Insufficient documentation

## 2013-12-10 DIAGNOSIS — E785 Hyperlipidemia, unspecified: Secondary | ICD-10-CM | POA: Diagnosis not present

## 2013-12-10 DIAGNOSIS — R5383 Other fatigue: Secondary | ICD-10-CM | POA: Diagnosis not present

## 2013-12-10 DIAGNOSIS — E118 Type 2 diabetes mellitus with unspecified complications: Secondary | ICD-10-CM | POA: Insufficient documentation

## 2013-12-10 DIAGNOSIS — E039 Hypothyroidism, unspecified: Secondary | ICD-10-CM | POA: Insufficient documentation

## 2013-12-10 DIAGNOSIS — I429 Cardiomyopathy, unspecified: Secondary | ICD-10-CM | POA: Diagnosis not present

## 2013-12-10 DIAGNOSIS — F329 Major depressive disorder, single episode, unspecified: Secondary | ICD-10-CM | POA: Diagnosis not present

## 2013-12-10 DIAGNOSIS — Z6836 Body mass index (BMI) 36.0-36.9, adult: Secondary | ICD-10-CM | POA: Diagnosis not present

## 2013-12-10 DIAGNOSIS — Z5189 Encounter for other specified aftercare: Secondary | ICD-10-CM | POA: Diagnosis present

## 2013-12-10 DIAGNOSIS — I4891 Unspecified atrial fibrillation: Secondary | ICD-10-CM | POA: Insufficient documentation

## 2013-12-10 DIAGNOSIS — Z8673 Personal history of transient ischemic attack (TIA), and cerebral infarction without residual deficits: Secondary | ICD-10-CM | POA: Diagnosis not present

## 2013-12-10 DIAGNOSIS — Z95810 Presence of automatic (implantable) cardiac defibrillator: Secondary | ICD-10-CM | POA: Insufficient documentation

## 2013-12-10 DIAGNOSIS — Z7901 Long term (current) use of anticoagulants: Secondary | ICD-10-CM | POA: Insufficient documentation

## 2013-12-10 DIAGNOSIS — N189 Chronic kidney disease, unspecified: Secondary | ICD-10-CM | POA: Insufficient documentation

## 2013-12-10 LAB — GLUCOSE, CAPILLARY
GLUCOSE-CAPILLARY: 265 mg/dL — AB (ref 70–99)
Glucose-Capillary: 180 mg/dL — ABNORMAL HIGH (ref 70–99)
Glucose-Capillary: 113 mg/dL — ABNORMAL HIGH (ref 70–99)

## 2013-12-10 MED ORDER — SACUBITRIL-VALSARTAN 49-51 MG PO TABS: 1.0000 | ORAL_TABLET | Freq: Two times a day (BID) | ORAL | Status: DC

## 2013-12-10 NOTE — Telephone Encounter (Signed)
pts wife called to request a new rx to reflect new doses. New rx sent into pharmacy

## 2013-12-12 ENCOUNTER — Encounter (HOSPITAL_COMMUNITY): Payer: 59

## 2013-12-12 ENCOUNTER — Encounter (HOSPITAL_COMMUNITY)
Admission: RE | Admit: 2013-12-12 | Discharge: 2013-12-12 | Disposition: A | Discharge status: Home / Self Care | Payer: 59 | Source: Ambulatory Visit | Attending: Cardiology | Admitting: Cardiology

## 2013-12-12 DIAGNOSIS — Z5189 Encounter for other specified aftercare: Secondary | ICD-10-CM | POA: Diagnosis not present

## 2013-12-12 LAB — GLUCOSE, CAPILLARY: GLUCOSE-CAPILLARY: 204 mg/dL — AB (ref 70–99)

## 2013-12-12 NOTE — Progress Notes (Signed)
Reviewed home exercise with pt today.  Pt plans to walk at home for exercise.  Reviewed THR, pulse, RPE, sign and symptoms, and when to call 911 or MD.  Pt voiced understanding. Athanasia Stanwood, MA, ACSM RCEP   

## 2013-12-14 ENCOUNTER — Encounter (HOSPITAL_COMMUNITY): Payer: 59

## 2013-12-14 ENCOUNTER — Encounter (HOSPITAL_COMMUNITY)
Admission: RE | Admit: 2013-12-14 | Discharge: 2013-12-14 | Disposition: A | Discharge status: Home / Self Care | Payer: 59 | Source: Ambulatory Visit | Attending: Cardiology | Admitting: Cardiology

## 2013-12-14 DIAGNOSIS — Z5189 Encounter for other specified aftercare: Secondary | ICD-10-CM | POA: Diagnosis not present

## 2013-12-17 LAB — GLUCOSE, CAPILLARY: GLUCOSE-CAPILLARY: 231 mg/dL — AB (ref 70–99)

## 2013-12-19 ENCOUNTER — Other Ambulatory Visit (HOSPITAL_COMMUNITY): Payer: Self-pay | Admitting: Cardiology

## 2013-12-19 ENCOUNTER — Encounter (HOSPITAL_COMMUNITY): Payer: 59

## 2013-12-19 ENCOUNTER — Encounter (HOSPITAL_COMMUNITY)
Admission: RE | Admit: 2013-12-19 | Discharge: 2013-12-19 | Disposition: A | Discharge status: Home / Self Care | Payer: 59 | Source: Ambulatory Visit | Attending: Cardiology | Admitting: Cardiology

## 2013-12-19 DIAGNOSIS — I5022 Chronic systolic (congestive) heart failure: Secondary | ICD-10-CM

## 2013-12-19 DIAGNOSIS — Z5189 Encounter for other specified aftercare: Secondary | ICD-10-CM | POA: Diagnosis not present

## 2013-12-19 LAB — GLUCOSE, CAPILLARY: GLUCOSE-CAPILLARY: 237 mg/dL — AB (ref 70–99)

## 2013-12-19 MED ORDER — SACUBITRIL-VALSARTAN 49-51 MG PO TABS: 1.0000 | ORAL_TABLET | Freq: Two times a day (BID) | ORAL | Status: DC

## 2013-12-19 NOTE — Progress Notes (Signed)
Larry [name] 63 y.o. male Nutrition Note Spoke with pt. Nutrition Plan and Nutrition Survey goals reviewed with pt. Pt is working toward following the Therapeutic Lifestyle Changes diet. Pt wants to lose wt. Wt loss tips reviewed.  Pt is diabetic. Last A1c indicates blood glucose poorly controlled. Pt states he checks his CBG's once daily "although I know I should probably be checking my sugar more." Fasting CBG's reportedly 120-130 mg/dL. Per discussion, pt has good insurance to cover his insulin. Pt reports injecting insulin into red welts on his abdomen. Pt educated re: the need to rotate sites on his belly and not to use welts to inject insulin. Pt expressed understanding. Barnet Pall, RN, notified of pt's reported welts. Per RN, pt abdomen examined and showed no signs of raised welts. This Probation officer went over Diabetes Education test results. Pt expressed understanding of the information reviewed. Pt aware of nutrition education classes offered.  Nutrition Diagnosis ? Food-and nutrition-related knowledge deficit related to lack of exposure to information as related to diagnosis of: ? CVD ? DM (A1c 11.1)  ? Obesity related to excessive energy intake as evidenced by a BMI of 35.5  Nutrition RX/ Estimated Daily Nutrition Needs for: wt loss  1700-2200 Kcal, 45-60 gm fat, 11-15 gm sat fat, 1.7-2.2 gm trans-fat, <1500 mg sodium, 250 gm CHO   Nutrition Intervention ? Pt's individual nutrition plan reviewed with pt. ? Benefits of adopting Therapeutic Lifestyle Changes discussed when Medficts reviewed. ? Pt to attend the Portion Distortion class ? Pt to attend the  ? Nutrition I class                     ? Nutrition II class        ? Diabetes Blitz class       ? Diabetes Q & A class ? Pt given handouts for: ? Nutrition I class ? Nutrition II class ? Diabetes Blitz class  ? Continue client-centered nutrition education by RD, as part of interdisciplinary care. Goal(s) ? Pt to identify and limit  food sources of saturated fat, trans fat, and cholesterol ? Pt to identify food quantities necessary to achieve: ? wt loss to a goal wt of 102.2-110.4 kg (224-242 lb) at graduation from cardiac rehab.  ? CBG concentrations in the normal range or as close to normal as is safely possible. Monitor and Evaluate progress toward nutrition goal with team. Nutrition Risk: Change to Moderate Derek Mound, M.Ed, RD, LDN, CDE 12/19/2013 10:40 AM

## 2013-12-21 ENCOUNTER — Encounter (HOSPITAL_COMMUNITY): Payer: 59

## 2013-12-21 ENCOUNTER — Encounter (HOSPITAL_COMMUNITY)
Admission: RE | Admit: 2013-12-21 | Discharge: 2013-12-21 | Disposition: A | Discharge status: Home / Self Care | Payer: 59 | Source: Ambulatory Visit | Attending: Cardiology | Admitting: Cardiology

## 2013-12-21 DIAGNOSIS — Z5189 Encounter for other specified aftercare: Secondary | ICD-10-CM | POA: Diagnosis not present

## 2013-12-21 LAB — GLUCOSE, CAPILLARY: Glucose-Capillary: 200 mg/dL — ABNORMAL HIGH (ref 70–99)

## 2013-12-26 ENCOUNTER — Encounter (HOSPITAL_COMMUNITY): Payer: 59

## 2013-12-28 ENCOUNTER — Encounter (HOSPITAL_COMMUNITY): Payer: 59

## 2013-12-28 ENCOUNTER — Encounter (HOSPITAL_COMMUNITY)
Admission: RE | Admit: 2013-12-28 | Discharge: 2013-12-28 | Disposition: A | Discharge status: Home / Self Care | Payer: 59 | Source: Ambulatory Visit | Attending: Cardiology | Admitting: Cardiology

## 2013-12-28 DIAGNOSIS — Z5189 Encounter for other specified aftercare: Secondary | ICD-10-CM | POA: Diagnosis not present

## 2013-12-28 LAB — GLUCOSE, CAPILLARY: GLUCOSE-CAPILLARY: 272 mg/dL — AB (ref 70–99)

## 2014-01-02 ENCOUNTER — Encounter (HOSPITAL_COMMUNITY): Payer: 59

## 2014-01-04 ENCOUNTER — Encounter (HOSPITAL_COMMUNITY): Payer: 59

## 2014-01-09 ENCOUNTER — Encounter (HOSPITAL_COMMUNITY): Payer: 59

## 2014-01-11 ENCOUNTER — Encounter (HOSPITAL_COMMUNITY): Payer: 59

## 2014-01-14 ENCOUNTER — Encounter (HOSPITAL_COMMUNITY): Payer: Self-pay

## 2014-01-14 ENCOUNTER — Ambulatory Visit (HOSPITAL_COMMUNITY)
Admission: RE | Admit: 2014-01-14 | Discharge: 2014-01-14 | Disposition: A | Discharge status: Home / Self Care | Payer: 59 | Source: Ambulatory Visit | Attending: Internal Medicine | Admitting: Internal Medicine

## 2014-01-14 VITALS — BP 164/90 | HR 90 | Wt 264.8 lb

## 2014-01-14 DIAGNOSIS — N189 Chronic kidney disease, unspecified: Secondary | ICD-10-CM | POA: Insufficient documentation

## 2014-01-14 DIAGNOSIS — D472 Monoclonal gammopathy: Secondary | ICD-10-CM | POA: Insufficient documentation

## 2014-01-14 DIAGNOSIS — I5022 Chronic systolic (congestive) heart failure: Secondary | ICD-10-CM | POA: Diagnosis not present

## 2014-01-14 DIAGNOSIS — I429 Cardiomyopathy, unspecified: Secondary | ICD-10-CM | POA: Insufficient documentation

## 2014-01-14 DIAGNOSIS — Z8249 Family history of ischemic heart disease and other diseases of the circulatory system: Secondary | ICD-10-CM | POA: Diagnosis not present

## 2014-01-14 DIAGNOSIS — I480 Paroxysmal atrial fibrillation: Secondary | ICD-10-CM | POA: Diagnosis not present

## 2014-01-14 DIAGNOSIS — Z794 Long term (current) use of insulin: Secondary | ICD-10-CM | POA: Insufficient documentation

## 2014-01-14 DIAGNOSIS — N183 Chronic kidney disease, stage 3 unspecified: Secondary | ICD-10-CM

## 2014-01-14 DIAGNOSIS — F329 Major depressive disorder, single episode, unspecified: Secondary | ICD-10-CM | POA: Diagnosis not present

## 2014-01-14 DIAGNOSIS — Z7901 Long term (current) use of anticoagulants: Secondary | ICD-10-CM | POA: Insufficient documentation

## 2014-01-14 DIAGNOSIS — E1165 Type 2 diabetes mellitus with hyperglycemia: Secondary | ICD-10-CM | POA: Insufficient documentation

## 2014-01-14 DIAGNOSIS — R079 Chest pain, unspecified: Secondary | ICD-10-CM | POA: Insufficient documentation

## 2014-01-14 DIAGNOSIS — I129 Hypertensive chronic kidney disease with stage 1 through stage 4 chronic kidney disease, or unspecified chronic kidney disease: Secondary | ICD-10-CM | POA: Insufficient documentation

## 2014-01-14 DIAGNOSIS — E785 Hyperlipidemia, unspecified: Secondary | ICD-10-CM | POA: Diagnosis not present

## 2014-01-14 DIAGNOSIS — Z87891 Personal history of nicotine dependence: Secondary | ICD-10-CM | POA: Diagnosis not present

## 2014-01-14 DIAGNOSIS — E669 Obesity, unspecified: Secondary | ICD-10-CM | POA: Insufficient documentation

## 2014-01-14 DIAGNOSIS — Z79899 Other long term (current) drug therapy: Secondary | ICD-10-CM | POA: Diagnosis not present

## 2014-01-14 DIAGNOSIS — G4733 Obstructive sleep apnea (adult) (pediatric): Secondary | ICD-10-CM

## 2014-01-14 DIAGNOSIS — E118 Type 2 diabetes mellitus with unspecified complications: Secondary | ICD-10-CM | POA: Insufficient documentation

## 2014-01-14 DIAGNOSIS — G8929 Other chronic pain: Secondary | ICD-10-CM

## 2014-01-14 DIAGNOSIS — Z8673 Personal history of transient ischemic attack (TIA), and cerebral infarction without residual deficits: Secondary | ICD-10-CM | POA: Diagnosis not present

## 2014-01-14 DIAGNOSIS — E039 Hypothyroidism, unspecified: Secondary | ICD-10-CM | POA: Diagnosis not present

## 2014-01-14 DIAGNOSIS — R0789 Other chest pain: Secondary | ICD-10-CM

## 2014-01-14 DIAGNOSIS — M549 Dorsalgia, unspecified: Secondary | ICD-10-CM

## 2014-01-14 DIAGNOSIS — E8770 Fluid overload, unspecified: Secondary | ICD-10-CM | POA: Diagnosis not present

## 2014-01-14 LAB — BASIC METABOLIC PANEL
ANION GAP: 14 (ref 5–15)
BUN: 24 mg/dL — ABNORMAL HIGH (ref 6–23)
CALCIUM: 8.6 mg/dL (ref 8.4–10.5)
CO2: 21 meq/L (ref 19–32)
CREATININE: 1.42 mg/dL — AB (ref 0.50–1.35)
Chloride: 101 mEq/L (ref 96–112)
GFR calc Af Amer: 60 mL/min — ABNORMAL LOW (ref >90)
GFR, EST NON AFRICAN AMERICAN: 52 mL/min — AB (ref >90)
Glucose, Bld: 378 mg/dL — ABNORMAL HIGH (ref 70–99)
Potassium: 4.6 mEq/L (ref 3.7–5.3)
Sodium: 136 mEq/L — ABNORMAL LOW (ref 137–147)

## 2014-01-14 LAB — C-REACTIVE PROTEIN: CRP: 0.7 mg/dL — AB (ref <0.60)

## 2014-01-14 LAB — RHEUMATOID FACTOR: Rhuematoid fact SerPl-aCnc: <10 IU/mL (ref <=14)

## 2014-01-14 LAB — PRO B NATRIURETIC PEPTIDE: Pro B Natriuretic peptide (BNP): 5769 pg/mL — ABNORMAL HIGH (ref 0–125)

## 2014-01-14 LAB — SEDIMENTATION RATE: Sed Rate: 28 mm/hr — ABNORMAL HIGH (ref 0–16)

## 2014-01-14 LAB — DIGOXIN LEVEL: Digoxin Level: 0.8 ng/mL (ref 0.8–2.0)

## 2014-01-14 MED ORDER — TORSEMIDE 20 MG PO TABS: 40.0000 mg | ORAL_TABLET | Freq: Every day | ORAL | Status: DC

## 2014-01-14 NOTE — Progress Notes (Signed)
Patient ID: Larry [name], male   DOB: 03/10/1950, 63 y.o.   MRN: [id number] PCP: Dr. Rex Kras Referring MD: Dr. Irish Lack Endocrinologist: Dr Buddy Duty  63 yo with history of chronic systolic CHF (nonischemic cardiomyopathy), CKD, prior CVA, and paroxysmal atrial fibrillation.  Patient says that he has been known to have a low EF for years.  He has a [employer], political party ICD.  He had LHC in 2009 with nonobstructive disease. Last echo in 3/15 showed moderate LV dilation with EF 25-30%.    Patient had [place] 8/15 showing low cardiac output, mildly elevated PCWP and primarily pulmonary venous hypertension.  He was transitioned from Lasix to torsemide and started on digoxin.  At a prior appointment, [name] was started and valsartan stopped.  He is currently taking only 20 mg torsemide, we had thought he was on 40 mg daily. Weight is up about 12 lbs.    Patient is short of breath after walking about 1/2 block.  He is short of breath walking around the grocery store.  No orthopnea/PND.  No lightheadedness or syncope.  He has been having left-sided aching chest pain that will last about 5 minutes at a time.  It happens about once a week and has not definite trigger (not clearly exertional).  He started cardiac rehab but is not able to drive now because of a cataract so had to drop out.  Blood glucose is still not under good control.  He is awaiting a repeat sleep study.   Labs (2/15): LDL 103 Labs (3/15): K 4.1, creatinine 1.5, BNP 164 Labs (4/15): K 3.5, creatinine 3.3 => 1.9 => 2.1, BNP 96 Labs (4/15): K 4.3, creatinine 1.43, glucose 308 Labs (5/15): K 5.1 Creatinine 1.35  Labs (8/15): K 5 => 4.7 => 3.8, creatinine 1.19 => 1.57 => 1.6, digoxin 1.1 Labs (9/15): K 4.6, creatinine 1.32 => 1.9 => 1.1, digoxin 0.6 => 1.2 (not trough) Labs (10/15): K 4.5, creatinine 1.36, pro-BNP 1261  PMH: 1. Chronic systolic CHF: Nonischemic cardiomyopathy.  Most recent echo in 3/15 with EF 25-30%, moderate LV dilation, mild LVH, moderate  diastolic dysfunction, IVC normal, mildly decreased RV systolic function.  Prior echo in 2011 also with EF 25-30%.  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  [employer] Jude ICD. CPX 05/2013: FVC 3.89 (82%), FEV1 2.99 (83%), FEV1/FVC 77%, Peak HR 118 (74% predicted max), Peak VO2 12.4 (51% of predicted; when corrected to IBW =17.7), VE/VCO2 29.9, OUES 1.39, Peak RER 1.21.  RHC (8/15) with mean RA 6, PA 60/21 mean 36, mean PCWP 21, CI 1.81, PVR 3.6 WU.  2. Type II diabetes 3. CKD 4. Monoclonal gammopathy of uncertain etiology.  5. CVA 2007 6. Degenerative disc disease.  7. Pituitary microadenoma: Nonfunctioning, s/p surgery in 2009.  8. OSA: Unable to tolerate Bipap/CPAP. 9. Depression 10. Hypothyroidism 11. Hyperlipidemia 12. Atrial fibrillation: Paroxysmal.  Patient is on Xarelto.   SH: Married, former cigar smoker, unemployed.   FH: Father with MI x 2 and CHF, strong history of CAD on his father's side.   ROS: All systems reviewed and negative except as per HPI.   Current Outpatient Prescriptions  Medication Sig Dispense Refill  . atorvastatin (LIPITOR) 40 MG tablet Take 1 tablet (40 mg total) by mouth daily. 90 tablet 1  . digoxin (LANOXIN) 0.125 MG tablet Take 1 tablet (0.125 mg total) by mouth daily. 90 tablet 1  . DULoxetine (CYMBALTA) 30 MG capsule Take 60 mg by mouth 2 (two) times daily.     [name]  gabapentin (NEURONTIN) 300 MG capsule Take 300 mg by mouth at bedtime. One to two tablets at bedtime as needed    . insulin aspart (NOVOLOG) 100 UNIT/ML injection Inject 60 Units into the skin 3 (three) times daily before meals.     [name] LANTUS SOLOSTAR 100 UNIT/ML Solostar Pen Inject 60 Units into the skin daily.    . metoprolol succinate (TOPROL-XL) 100 MG 24 hr tablet 100 mg in am and 50 mg in pm    . Multiple Vitamin (MULITIVITAMIN WITH MINERALS) TABS Take 1 tablet by mouth daily.    [name] oxyCODONE-acetaminophen (PERCOCET) 10-325 MG per tablet Take 1 tablet by mouth every 4 (four) hours as needed for pain.  For pain    . rivaroxaban (XARELTO) 20 MG TABS tablet Take 20 mg by mouth daily with supper.    . Sacubitril-Valsartan 49-51 MG TABS Take 1 tablet by mouth 2 (two) times daily. 180 tablet 2  . senna (SENOKOT) 8.6 MG tablet Take 1 tablet by mouth 2 (two) times daily.    [name] spironolactone (ALDACTONE) 25 MG tablet Take 1 tablet (25 mg total) by mouth daily. 30 tablet 6  . torsemide (DEMADEX) 20 MG tablet Take 2 tablets (40 mg total) by mouth daily.    [name] zolpidem (AMBIEN) 10 MG tablet Take 10 mg by mouth at bedtime as needed for sleep. For sleep     No current facility-administered medications for this encounter.    Filed Vitals:   01/14/14 1041  BP: 164/90  Pulse: 90  Weight: 264 lb 12.8 oz (120.112 kg)  SpO2: 93%   General: NAD Wife present  Neck: Thick, JVP 10 cm. No thyromegaly or thyroid nodule.  Lungs: Clear to auscultation bilaterally with normal respiratory effort. CV: Nondisplaced PMI.  Heart regular S1/S2, no S3/S4, no murmur.  1+ edema 1/2 up lower legs bilaterally.  No carotid bruit.  Normal pedal pulses.  Abdomen: Obese, Soft, tender to palpation, obese, no hepatosplenomegaly, non-distended Skin: Intact without lesions or rashes.  Neurologic: Alert and oriented x 3.  Psych: Flat affect. Extremities: No clubbing or cyanosis.   Assessment/plan: 1. Chronic systolic CHF: Nonischemic cardiomyopathy, known for years.  EF has been stable at 25-30% (04/2013). Has ST Jude ICD, narrow QRS so not CRT candidate. CPX VO2 low but slope ok.  NYHA class III symptoms, somewhat worsened with volume overload on exam.  Weight is up 12 lbs.  He is only taking 20 mg torsemide daily, which is less than I had thought he was taking.   - Increase torsemide to 60 mg daily x 4 days then decrease to 40 mg daily.   - Continue digoxin give low output on RHC.  Last digoxin level 1.2 but not a trough. Will check trough today.  - Continue Toprol XL 100 qam/50 qpm and current spironolactone. - Continue Entresto  49/51 bid.  - We have discussed the fact that he may be nearing the need for advanced therapies.  Weight will be an issue for transplant.  Will see how he does with med adjustments but may need to move towards LVAD vs transplant in near future.   - Reinforced the need and importance of daily weights, a low sodium diet, and fluid restriction (less than 2 L a day). Instructed to call the HF clinic if weight increases more than 3 lbs overnight or 5 lbs in a week.  2. CKD: Most recent creatinine stable, will repeat today and check again in 2 wks with increase in  torsemide.   3. Atrial fibrillation: Paroxysmal.  Continue Xarelto 20 mg daily. He is not in atrial fibrillation today though it appears that he had a run of atrial fibrillation in 9/15 by ICD interrogation.  4. Obesity:  Needs to lose weight.  Asked him to cut back on his portions and try low carb diet.   5. DM2: Continue to follow with endocrinology.  Glucose control still not ideal.  6. Depression: Seems to be ongoing issue.  7. Fatigue: In addition to CHF, I think that OSA (that is untreated as he has had a hard time with CPAP) and depression play a large role. He has an appointment for a repeat sleep study.   8. Chest pain: Patient has been having episodes of somewhat atypical chest pain.  I am going to arrange for ETT-Cardiolite to assess for ischemia given his multiple risk factors (uncontrolled DM, HTN, hyperlipidemia).   Followup in 2-3 week in office.   Loralie Champagne 01/14/2014

## 2014-01-14 NOTE — Patient Instructions (Signed)
Take Torsemide 60 mg (3 tabs) daily for 4 days then DECREASE to 40 mg (2 tabs) daily,  If you were not currently taking 20 mg (1 tab) daily at home please call us back and let us know  Labs today  Your physician has requested that you have en exercise stress myoview. For further information please visit [email or web address]. Please follow instruction sheet, as given.  Your physician recommends that you schedule a follow-up appointment in: 3 weeks with Dr Aundra Dubin

## 2014-01-15 LAB — ANA: Anti Nuclear Antibody(ANA): NEGATIVE

## 2014-01-16 ENCOUNTER — Encounter (HOSPITAL_COMMUNITY): Payer: 59

## 2014-01-17 ENCOUNTER — Ambulatory Visit (HOSPITAL_COMMUNITY): Payer: 59 | Attending: Cardiovascular Disease | Admitting: Radiology

## 2014-01-17 ENCOUNTER — Encounter (HOSPITAL_COMMUNITY): Payer: Self-pay | Admitting: Cardiology

## 2014-01-17 DIAGNOSIS — R079 Chest pain, unspecified: Secondary | ICD-10-CM | POA: Insufficient documentation

## 2014-01-17 DIAGNOSIS — I480 Paroxysmal atrial fibrillation: Secondary | ICD-10-CM

## 2014-01-17 DIAGNOSIS — R0789 Other chest pain: Secondary | ICD-10-CM

## 2014-01-17 MED ORDER — ADENOSINE (DIAGNOSTIC) 3 MG/ML IV SOLN
0.5600 mg/kg | Freq: Once | INTRAVENOUS | Status: AC
  Administered 2014-01-17: 60 mg via INTRAVENOUS

## 2014-01-17 MED ORDER — TECHNETIUM TC 99M SESTAMIBI GENERIC - CARDIOLITE
11.0000 | Freq: Once | INTRAVENOUS | Status: AC | PRN
  Administered 2014-01-17: 11 via INTRAVENOUS

## 2014-01-17 MED ORDER — TECHNETIUM TC 99M SESTAMIBI GENERIC - CARDIOLITE
33.0000 | Freq: Once | INTRAVENOUS | Status: AC | PRN
  Administered 2014-01-17: 33 via INTRAVENOUS

## 2014-01-17 NOTE — Progress Notes (Addendum)
[facility] NUCLEAR MED [address] [place], [place] [address] [phone]    Cardiology Nuclear Med Study  Larry [name] is a 63 y.o. male     MRN : [id number]     DOB: January 15, 1951  Procedure Date: 01/17/2014  Nuclear Med Background Indication for Stress Test:  Evaluation for Ischemia and Follow-Up CAD History:  CAD, MPI, AICD, Afib Cardiac Risk Factors: Hypertension and IDDM   Symptoms:  Chest Pain (last date of chest discomfort was two weeks ago) and DOE   Nuclear Pre-Procedure Caffeine/Decaff Intake:  None> 12 hrs NPO After: 8:00pm   Lungs:  clear O2 Sat: 93% on room air. IV 0.9% NS with Angio Cath:  22g  IV Site: R Antecubital x 1, tolerated well IV Started by:  Irven Baltimore, RN  Chest Size (in):  52 Cup Size: n/a  Height: 5\' 10"  (1.778 m)  Weight:  256 lb (116.121 kg)  BMI:  Body mass index is 36.73 kg/(m^2). Tech Comments:  1/2 dose Insulin last night, no insulin today. CBG on arrival was 166.Patient held Toprol x 24 hrs. Patient brought xanax tablets and took 2 tablets on arrival for claustrophobia. Wife to drive the patient home. Irven Baltimore, RN.    Nuclear Med Study 1 or 2 day study: 1 day  Stress Test Type:  Adenosine  Reading MD: N/A  Order Authorizing Provider:  Loralie Champagne, MD  Resting Radionuclide: Technetium 92m Sestamibi  Resting Radionuclide Dose: 11.0 mCi   Stress Radionuclide:  Technetium 66m Sestamibi  Stress Radionuclide Dose: 33.0 mCi           Stress Protocol Rest HR: 75 Stress HR: 97  Rest BP: 116/68 Stress BP: 110/69  Exercise Time (min): n/a METS: n/a   Predicted Max HR: 159 bpm % Max HR: 61.01 bpm Rate Pressure Product: 11252   Dose of Adenosine (mg):  60 mg Dose of Lexiscan: n/a mg  Dose of Atropine (mg): n/a Dose of Dobutamine: n/a mcg/kg/min (at max HR)  Stress Test Technologist: Glade Lloyd, BS-ES  Nuclear Technologist:  Annye Rusk, CNMT     Rest Procedure:  Myocardial perfusion imaging was performed at  rest 45 minutes following the intravenous administration of Technetium 71m Sestamibi. Rest ECG: Atrial fib, previous ant. MI  , demand pacing  Stress Procedure:  The patient received IV adenosine at 140 mcg/kg/min for 4 minutes.  Technetium 45m Sestamibi was injected at the 2 minute mark and quantitative spect images were obtained after a 45 minute delay.  During the infusion of Adenosine the patient complained of chest pain and fatigue. These symptoms resolved in recovery.  Stress ECG: No significant change from baseline ECG  QPS Raw Data Images:  Normal; no motion artifact; normal heart/lung ratio.  The LV is markedly enlarged.  Stress Images:  There is a large, severe defect in the distal anterior, apical, distal inferior and inferolateral walls.   Rest Images:  There is a large, severe defect in the distal anterior, apical, distal inferior and inferolateral walls. Subtraction (SDS):  No evidence of ischemia.  There is a large scar most consistent with a previous MI  Transient Ischemic Dilatation (Normal <1.22):  1.02 Lung/Heart Ratio (Normal <0.45):  0.43  Quantitative Gated Spect Images QGS EDV:  231 ml QGS ESV:  178 ml  Impression Exercise Capacity:  Adenosine study with no exercise. BP Response:  Normal blood pressure response. Clinical Symptoms:  No significant symptoms noted. ECG Impression:  No significant ST  segment change suggestive of ischemia. Comparison with Prior Nuclear Study: No images to compare  Overall Impression:  High risk stress nuclear study because of the LV dysfunction and previous large MI.  There is no ischemia..  LV Ejection Fraction: 23%.  LV Wall Motion:  The LV is markedly enlarged with distal anterior , apical, distal inferior and inferolaterlal akinesis.        Thayer Headings, Brooke Bonito., MD, [facility] 01/17/2014, 2:48 PM [address],  [place] Pager [phone]  Prior Infarct with no ischemia.  Please report to patient.     Loralie Champagne 01/20/2014

## 2014-01-18 ENCOUNTER — Encounter (HOSPITAL_COMMUNITY): Payer: 59

## 2014-01-21 ENCOUNTER — Telehealth: Payer: Self-pay

## 2014-01-21 ENCOUNTER — Ambulatory Visit (HOSPITAL_COMMUNITY)
Admission: RE | Admit: 2014-01-21 | Discharge: 2014-01-21 | Disposition: A | Discharge status: Home / Self Care | Payer: 59 | Source: Ambulatory Visit | Attending: Internal Medicine | Admitting: Internal Medicine

## 2014-01-21 ENCOUNTER — Telehealth (HOSPITAL_COMMUNITY): Payer: Self-pay | Admitting: *Deleted

## 2014-01-21 DIAGNOSIS — I5022 Chronic systolic (congestive) heart failure: Secondary | ICD-10-CM | POA: Insufficient documentation

## 2014-01-21 LAB — BASIC METABOLIC PANEL
Anion gap: 12 (ref 5–15)
BUN: 37 mg/dL — ABNORMAL HIGH (ref 6–23)
CHLORIDE: 100 meq/L (ref 96–112)
CO2: 26 mEq/L (ref 19–32)
Calcium: 8.9 mg/dL (ref 8.4–10.5)
Creatinine, Ser: 1.53 mg/dL — ABNORMAL HIGH (ref 0.50–1.35)
GFR calc Af Amer: 55 mL/min — ABNORMAL LOW (ref >90)
GFR, EST NON AFRICAN AMERICAN: 47 mL/min — AB (ref >90)
GLUCOSE: 196 mg/dL — AB (ref 70–99)
POTASSIUM: 3.9 meq/L (ref 3.7–5.3)
Sodium: 138 mEq/L (ref 137–147)

## 2014-01-21 NOTE — Telephone Encounter (Signed)
Discussed lab results with pt and his wife, she states another MD had recently stopped his Xanax and started him on Neurontin, advised that could part of the issue and recommended she f/u with that MD regarding med changes, pt seems ok at this time and is not confused, wife states it mainly is very early in the AM around 5 or 6 that he is confused, again they will f/u with other MD.  If edema is not better in a day or 2 they will call me back, we did have a long discuss on salt and fluid intake and it seems that he does go over 2 L daily, he will cut back.  Advised the CP and SOB could also be due to anxiety since Xanax was stopped and pt does report having a lot of anxiety and on the "verge of a panic attack" again they will f/u with other MD

## 2014-01-21 NOTE — Telephone Encounter (Signed)
Pt's wife called earlier this AM very concerned about pt, she states that he has been getting increasingly more confused since last week and this week has been really bad, she states that he is very confused on the time, doesn't know how to drive the car and seems like a person who is "drunk" she states his wt has not really changed any from last week and he is still very SOB with activity, she states he is sleeping a lot more, even riding in the car he falls a sleep, overall she feels he is going down hill and is very concerned.  Discussed with Dr Aundra Dubin and he would like pt to have stat bmet to check kidney function, if ok he may need to go to ER for eval, wife agreeable and will bring him in for labs

## 2014-01-21 NOTE — Progress Notes (Signed)
Pt's wife aware.

## 2014-01-21 NOTE — Telephone Encounter (Signed)
Labs drawn today show worsening renal functions from 1 week ago. BUN 37, Creatinine 1.53. Per Dr. Mare Ferrari. DOD, decrease Torsemide to 40mg  daily, alt with 20mg  qod. Has followup Bmet in 2 weeks. Spoke with wife, and she voiced good understanding. Also advised her that he needs to weigh daily. Will forward these labs to [name], nephrologist.

## 2014-01-22 ENCOUNTER — Encounter: Payer: Self-pay | Admitting: Internal Medicine

## 2014-01-22 ENCOUNTER — Telehealth (HOSPITAL_COMMUNITY): Payer: Self-pay | Admitting: Vascular Surgery

## 2014-01-22 ENCOUNTER — Telehealth (HOSPITAL_COMMUNITY): Payer: Self-pay | Admitting: *Deleted

## 2014-01-22 NOTE — Telephone Encounter (Signed)
Pt's wife called concerned, she states Dr Sherryl Barters office called her last night with recommendations from the lab work from yesterday and she was unsure if she should decrease Torsemide as they recommended or continue the 40 mg daily as Dr Aundra Dubin had instructed, advised that with his edema and weight he needs to continue the 40 mg daily as advised by Dr Aundra Dubin, kidney function slightly elevated but he was on a higher Torsemide dose last week, pt's wife aware and agreeable

## 2014-01-22 NOTE — Telephone Encounter (Signed)
Open in error

## 2014-01-23 ENCOUNTER — Encounter (HOSPITAL_COMMUNITY): Payer: 59

## 2014-01-24 ENCOUNTER — Telehealth (HOSPITAL_COMMUNITY): Payer: Self-pay | Admitting: *Deleted

## 2014-01-25 ENCOUNTER — Encounter (HOSPITAL_COMMUNITY): Payer: 59

## 2014-01-30 ENCOUNTER — Encounter (HOSPITAL_COMMUNITY): Payer: 59

## 2014-01-30 ENCOUNTER — Encounter (HOSPITAL_BASED_OUTPATIENT_CLINIC_OR_DEPARTMENT_OTHER): Payer: 59

## 2014-02-01 ENCOUNTER — Inpatient Hospital Stay (HOSPITAL_COMMUNITY)
Admission: EM | Admit: 2014-02-01 | Discharge: 2014-02-03 | DRG: 292 | Disposition: A | Discharge status: Home / Self Care | Payer: 59 | Attending: Cardiology | Admitting: Cardiology

## 2014-02-01 ENCOUNTER — Encounter (HOSPITAL_COMMUNITY): Payer: 59

## 2014-02-01 ENCOUNTER — Encounter (HOSPITAL_COMMUNITY): Payer: Self-pay | Admitting: Emergency Medicine

## 2014-02-01 ENCOUNTER — Emergency Department (HOSPITAL_COMMUNITY): Payer: 59

## 2014-02-01 DIAGNOSIS — E1142 Type 2 diabetes mellitus with diabetic polyneuropathy: Secondary | ICD-10-CM | POA: Diagnosis present

## 2014-02-01 DIAGNOSIS — Z8673 Personal history of transient ischemic attack (TIA), and cerebral infarction without residual deficits: Secondary | ICD-10-CM

## 2014-02-01 DIAGNOSIS — G4733 Obstructive sleep apnea (adult) (pediatric): Secondary | ICD-10-CM | POA: Diagnosis present

## 2014-02-01 DIAGNOSIS — I472 Ventricular tachycardia, unspecified: Secondary | ICD-10-CM

## 2014-02-01 DIAGNOSIS — F418 Other specified anxiety disorders: Secondary | ICD-10-CM | POA: Diagnosis present

## 2014-02-01 DIAGNOSIS — R0602 Shortness of breath: Secondary | ICD-10-CM

## 2014-02-01 DIAGNOSIS — Z794 Long term (current) use of insulin: Secondary | ICD-10-CM | POA: Diagnosis not present

## 2014-02-01 DIAGNOSIS — Z801 Family history of malignant neoplasm of trachea, bronchus and lung: Secondary | ICD-10-CM

## 2014-02-01 DIAGNOSIS — E039 Hypothyroidism, unspecified: Secondary | ICD-10-CM | POA: Diagnosis present

## 2014-02-01 DIAGNOSIS — N183 Chronic kidney disease, stage 3 unspecified: Secondary | ICD-10-CM | POA: Diagnosis present

## 2014-02-01 DIAGNOSIS — I2510 Atherosclerotic heart disease of native coronary artery without angina pectoris: Secondary | ICD-10-CM | POA: Diagnosis present

## 2014-02-01 DIAGNOSIS — Z7901 Long term (current) use of anticoagulants: Secondary | ICD-10-CM

## 2014-02-01 DIAGNOSIS — I4891 Unspecified atrial fibrillation: Secondary | ICD-10-CM | POA: Diagnosis not present

## 2014-02-01 DIAGNOSIS — R778 Other specified abnormalities of plasma proteins: Secondary | ICD-10-CM | POA: Insufficient documentation

## 2014-02-01 DIAGNOSIS — I5023 Acute on chronic systolic (congestive) heart failure: Principal | ICD-10-CM | POA: Diagnosis present

## 2014-02-01 DIAGNOSIS — I480 Paroxysmal atrial fibrillation: Secondary | ICD-10-CM | POA: Diagnosis present

## 2014-02-01 DIAGNOSIS — F419 Anxiety disorder, unspecified: Secondary | ICD-10-CM | POA: Diagnosis present

## 2014-02-01 DIAGNOSIS — R7989 Other specified abnormal findings of blood chemistry: Secondary | ICD-10-CM | POA: Diagnosis present

## 2014-02-01 DIAGNOSIS — Z8249 Family history of ischemic heart disease and other diseases of the circulatory system: Secondary | ICD-10-CM

## 2014-02-01 DIAGNOSIS — I420 Dilated cardiomyopathy: Secondary | ICD-10-CM | POA: Diagnosis present

## 2014-02-01 DIAGNOSIS — Z833 Family history of diabetes mellitus: Secondary | ICD-10-CM

## 2014-02-01 DIAGNOSIS — I272 Other secondary pulmonary hypertension: Secondary | ICD-10-CM | POA: Diagnosis present

## 2014-02-01 DIAGNOSIS — I5022 Chronic systolic (congestive) heart failure: Secondary | ICD-10-CM

## 2014-02-01 DIAGNOSIS — I509 Heart failure, unspecified: Secondary | ICD-10-CM

## 2014-02-01 DIAGNOSIS — E11649 Type 2 diabetes mellitus with hypoglycemia without coma: Secondary | ICD-10-CM | POA: Diagnosis present

## 2014-02-01 DIAGNOSIS — Z95810 Presence of automatic (implantable) cardiac defibrillator: Secondary | ICD-10-CM

## 2014-02-01 DIAGNOSIS — G629 Polyneuropathy, unspecified: Secondary | ICD-10-CM | POA: Diagnosis present

## 2014-02-01 DIAGNOSIS — R410 Disorientation, unspecified: Secondary | ICD-10-CM | POA: Diagnosis present

## 2014-02-01 DIAGNOSIS — Z808 Family history of malignant neoplasm of other organs or systems: Secondary | ICD-10-CM | POA: Diagnosis not present

## 2014-02-01 DIAGNOSIS — R0789 Other chest pain: Secondary | ICD-10-CM

## 2014-02-01 DIAGNOSIS — Z87891 Personal history of nicotine dependence: Secondary | ICD-10-CM

## 2014-02-01 DIAGNOSIS — I4819 Other persistent atrial fibrillation: Secondary | ICD-10-CM | POA: Diagnosis present

## 2014-02-01 DIAGNOSIS — E119 Type 2 diabetes mellitus without complications: Secondary | ICD-10-CM

## 2014-02-01 DIAGNOSIS — Z888 Allergy status to other drugs, medicaments and biological substances status: Secondary | ICD-10-CM | POA: Diagnosis not present

## 2014-02-01 DIAGNOSIS — R079 Chest pain, unspecified: Secondary | ICD-10-CM | POA: Diagnosis present

## 2014-02-01 DIAGNOSIS — F329 Major depressive disorder, single episode, unspecified: Secondary | ICD-10-CM | POA: Diagnosis present

## 2014-02-01 DIAGNOSIS — E780 Pure hypercholesterolemia: Secondary | ICD-10-CM | POA: Diagnosis present

## 2014-02-01 HISTORY — DX: Essential (primary) hypertension: I10

## 2014-02-01 HISTORY — DX: Presence of cardiac pacemaker: Z95.0

## 2014-02-01 HISTORY — DX: Presence of automatic (implantable) cardiac defibrillator: Z95.810

## 2014-02-01 LAB — CBC WITH DIFFERENTIAL/PLATELET
BASOS ABS: 0.1 10*3/uL (ref 0.0–0.1)
BASOS PCT: 1 % (ref 0–1)
Eosinophils Absolute: 0.2 10*3/uL (ref 0.0–0.7)
Eosinophils Relative: 3 % (ref 0–5)
HCT: 46 % (ref 39.0–52.0)
Hemoglobin: 14.6 g/dL (ref 13.0–17.0)
Lymphocytes Relative: 28 % (ref 12–46)
Lymphs Abs: 2.3 10*3/uL (ref 0.7–4.0)
MCH: 32 pg (ref 26.0–34.0)
MCHC: 31.7 g/dL (ref 30.0–36.0)
MCV: 100.9 fL — AB (ref 78.0–100.0)
MONO ABS: 0.7 10*3/uL (ref 0.1–1.0)
Monocytes Relative: 9 % (ref 3–12)
NEUTROS ABS: 4.9 10*3/uL (ref 1.7–7.7)
Neutrophils Relative %: 59 % (ref 43–77)
Platelets: 249 10*3/uL (ref 150–400)
RBC: 4.56 MIL/uL (ref 4.22–5.81)
RDW: 14.9 % (ref 11.5–15.5)
WBC: 8.2 10*3/uL (ref 4.0–10.5)

## 2014-02-01 LAB — COMPREHENSIVE METABOLIC PANEL
ALBUMIN: 3.9 g/dL (ref 3.5–5.2)
ALT: 44 U/L (ref 0–53)
ANION GAP: 10 (ref 5–15)
AST: 38 U/L — AB (ref 0–37)
Alkaline Phosphatase: 88 U/L (ref 39–117)
BILIRUBIN TOTAL: 0.8 mg/dL (ref 0.3–1.2)
BUN: 35 mg/dL — AB (ref 6–23)
CALCIUM: 9.7 mg/dL (ref 8.4–10.5)
CO2: 27 mmol/L (ref 19–32)
CREATININE: 1.73 mg/dL — AB (ref 0.50–1.35)
Chloride: 100 mEq/L (ref 96–112)
GFR calc Af Amer: 47 mL/min — ABNORMAL LOW (ref >90)
GFR calc non Af Amer: 41 mL/min — ABNORMAL LOW (ref >90)
Glucose, Bld: 142 mg/dL — ABNORMAL HIGH (ref 70–99)
Potassium: 4.7 mmol/L (ref 3.5–5.1)
Sodium: 137 mmol/L (ref 135–145)
Total Protein: 8.3 g/dL (ref 6.0–8.3)

## 2014-02-01 LAB — TSH: TSH: 7.088 u[IU]/mL — AB (ref 0.350–4.500)

## 2014-02-01 LAB — GLUCOSE, CAPILLARY
Glucose-Capillary: 150 mg/dL — ABNORMAL HIGH (ref 70–99)
Glucose-Capillary: 162 mg/dL — ABNORMAL HIGH (ref 70–99)

## 2014-02-01 LAB — URINE MICROSCOPIC-ADD ON

## 2014-02-01 LAB — URINALYSIS, ROUTINE W REFLEX MICROSCOPIC
BILIRUBIN URINE: NEGATIVE
GLUCOSE, UA: NEGATIVE mg/dL
Ketones, ur: NEGATIVE mg/dL
Leukocytes, UA: NEGATIVE
NITRITE: NEGATIVE
PH: 5.5 (ref 5.0–8.0)
Protein, ur: >300 mg/dL — AB
Specific Gravity, Urine: 1.015 (ref 1.005–1.030)
Urobilinogen, UA: 0.2 mg/dL (ref 0.0–1.0)

## 2014-02-01 LAB — TROPONIN I
Troponin I: 0.18 ng/mL — ABNORMAL HIGH (ref <0.031)
Troponin I: 0.12 ng/mL — ABNORMAL HIGH (ref <0.031)
Troponin I: 0.14 ng/mL — ABNORMAL HIGH (ref <0.031)

## 2014-02-01 LAB — DIGOXIN LEVEL: Digoxin Level: 0.5 ng/mL — ABNORMAL LOW (ref 0.8–2.0)

## 2014-02-01 LAB — BRAIN NATRIURETIC PEPTIDE: B NATRIURETIC PEPTIDE 5: 2312.7 pg/mL — AB (ref 0.0–100.0)

## 2014-02-01 LAB — MAGNESIUM: Magnesium: 2.2 mg/dL (ref 1.5–2.5)

## 2014-02-01 LAB — PROTIME-INR
INR: 1.47 (ref 0.00–1.49)
Prothrombin Time: 18 seconds — ABNORMAL HIGH (ref 11.6–15.2)

## 2014-02-01 LAB — CBG MONITORING, ED: GLUCOSE-CAPILLARY: 122 mg/dL — AB (ref 70–99)

## 2014-02-01 MED ORDER — OXYCODONE-ACETAMINOPHEN 10-325 MG PO TABS: 1.0000 | ORAL_TABLET | Freq: Three times a day (TID) | ORAL | Status: DC

## 2014-02-01 MED ORDER — INSULIN ASPART 100 UNIT/ML ~~LOC~~ SOLN
0.0000 [IU] | Freq: Three times a day (TID) | SUBCUTANEOUS | Status: DC
  Administered 2014-02-01: 2 [IU] via SUBCUTANEOUS
  Administered 2014-02-02: 3 [IU] via SUBCUTANEOUS
  Administered 2014-02-02: 2 [IU] via SUBCUTANEOUS
  Administered 2014-02-03 (×2): 3 [IU] via SUBCUTANEOUS
  Filled 2014-02-01: qty 1

## 2014-02-01 MED ORDER — ZOLPIDEM TARTRATE 5 MG PO TABS
10.0000 mg | ORAL_TABLET | Freq: Every day | ORAL | Status: DC
  Administered 2014-02-01 – 2014-02-02 (×2): 10 mg via ORAL
  Filled 2014-02-01 (×2): qty 2

## 2014-02-01 MED ORDER — ASPIRIN EC 81 MG PO TBEC
81.0000 mg | DELAYED_RELEASE_TABLET | Freq: Every day | ORAL | Status: DC
  Administered 2014-02-01: 81 mg via ORAL
  Filled 2014-02-01 (×2): qty 1

## 2014-02-01 MED ORDER — SENNA 8.6 MG PO TABS
1.0000 | ORAL_TABLET | Freq: Two times a day (BID) | ORAL | Status: DC
  Administered 2014-02-01 – 2014-02-03 (×3): 8.6 mg via ORAL
  Filled 2014-02-01 (×5): qty 1

## 2014-02-01 MED ORDER — SODIUM CHLORIDE 0.9 % IJ SOLN: 3.0000 mL | INTRAMUSCULAR | Status: DC | PRN

## 2014-02-01 MED ORDER — RIVAROXABAN 20 MG PO TABS
20.0000 mg | ORAL_TABLET | Freq: Every day | ORAL | Status: DC
  Administered 2014-02-01 – 2014-02-02 (×2): 20 mg via ORAL
  Filled 2014-02-01 (×3): qty 1

## 2014-02-01 MED ORDER — SPIRONOLACTONE 25 MG PO TABS
25.0000 mg | ORAL_TABLET | Freq: Every day | ORAL | Status: DC
  Administered 2014-02-01 – 2014-02-03 (×2): 25 mg via ORAL
  Filled 2014-02-01 (×3): qty 1

## 2014-02-01 MED ORDER — DIGOXIN 125 MCG PO TABS
0.1250 mg | ORAL_TABLET | Freq: Every day | ORAL | Status: DC
  Administered 2014-02-01 – 2014-02-03 (×3): 0.125 mg via ORAL
  Filled 2014-02-01 (×3): qty 1

## 2014-02-01 MED ORDER — OXYCODONE-ACETAMINOPHEN 5-325 MG PO TABS
1.0000 | ORAL_TABLET | Freq: Three times a day (TID) | ORAL | Status: DC
  Administered 2014-02-01 – 2014-02-03 (×6): 1 via ORAL
  Filled 2014-02-01 (×6): qty 1

## 2014-02-01 MED ORDER — ATORVASTATIN CALCIUM 40 MG PO TABS
40.0000 mg | ORAL_TABLET | Freq: Every day | ORAL | Status: DC
  Administered 2014-02-01 – 2014-02-02 (×2): 40 mg via ORAL
  Filled 2014-02-01 (×3): qty 1

## 2014-02-01 MED ORDER — INSULIN ASPART 100 UNIT/ML ~~LOC~~ SOLN: 0.0000 [IU] | Freq: Every day | SUBCUTANEOUS | Status: DC

## 2014-02-01 MED ORDER — DULOXETINE HCL 60 MG PO CPEP
60.0000 mg | ORAL_CAPSULE | Freq: Two times a day (BID) | ORAL | Status: DC
  Administered 2014-02-01 – 2014-02-03 (×4): 60 mg via ORAL
  Filled 2014-02-01 (×5): qty 1

## 2014-02-01 MED ORDER — OXYCODONE-ACETAMINOPHEN 5-325 MG PO TABS
1.0000 | ORAL_TABLET | Freq: Once | ORAL | Status: AC
  Administered 2014-02-01: 1 via ORAL
  Filled 2014-02-01: qty 1

## 2014-02-01 MED ORDER — METOPROLOL SUCCINATE ER 50 MG PO TB24
50.0000 mg | ORAL_TABLET | Freq: Every day | ORAL | Status: DC
  Administered 2014-02-01 – 2014-02-02 (×2): 50 mg via ORAL
  Filled 2014-02-01 (×3): qty 1

## 2014-02-01 MED ORDER — ACETAMINOPHEN 325 MG PO TABS: 650.0000 mg | ORAL_TABLET | ORAL | Status: DC | PRN

## 2014-02-01 MED ORDER — METOPROLOL SUCCINATE ER 100 MG PO TB24
100.0000 mg | ORAL_TABLET | Freq: Every day | ORAL | Status: DC
  Filled 2014-02-01: qty 1

## 2014-02-01 MED ORDER — OXYCODONE HCL 5 MG PO TABS
5.0000 mg | ORAL_TABLET | Freq: Three times a day (TID) | ORAL | Status: DC
  Administered 2014-02-01 – 2014-02-03 (×6): 5 mg via ORAL
  Filled 2014-02-01: qty 1
  Filled 2014-02-01: qty 2
  Filled 2014-02-01: qty 1
  Filled 2014-02-01: qty 2
  Filled 2014-02-01 (×3): qty 1

## 2014-02-01 MED ORDER — ONDANSETRON HCL 4 MG/2ML IJ SOLN: 4.0000 mg | Freq: Four times a day (QID) | INTRAMUSCULAR | Status: DC | PRN

## 2014-02-01 MED ORDER — SODIUM CHLORIDE 0.9 % IJ SOLN
3.0000 mL | Freq: Two times a day (BID) | INTRAMUSCULAR | Status: DC
  Administered 2014-02-01 – 2014-02-03 (×5): 3 mL via INTRAVENOUS

## 2014-02-01 MED ORDER — INSULIN GLARGINE 100 UNIT/ML ~~LOC~~ SOLN
60.0000 [IU] | Freq: Every day | SUBCUTANEOUS | Status: DC
  Administered 2014-02-01 – 2014-02-03 (×3): 60 [IU] via SUBCUTANEOUS
  Filled 2014-02-01 (×4): qty 0.6

## 2014-02-01 MED ORDER — SODIUM CHLORIDE 0.9 % IV SOLN: 250.0000 mL | INTRAVENOUS | Status: DC | PRN

## 2014-02-01 MED ORDER — FUROSEMIDE 10 MG/ML IJ SOLN
80.0000 mg | Freq: Two times a day (BID) | INTRAMUSCULAR | Status: DC
  Administered 2014-02-01 – 2014-02-03 (×5): 80 mg via INTRAVENOUS
  Filled 2014-02-01 (×7): qty 8

## 2014-02-01 MED ORDER — ADULT MULTIVITAMIN W/MINERALS CH
1.0000 | ORAL_TABLET | Freq: Every day | ORAL | Status: DC
  Administered 2014-02-01 – 2014-02-03 (×3): 1 via ORAL
  Filled 2014-02-01 (×3): qty 1

## 2014-02-01 MED ORDER — ALPRAZOLAM 0.5 MG PO TABS: 0.5000 mg | ORAL_TABLET | Freq: Three times a day (TID) | ORAL | Status: DC | PRN

## 2014-02-01 MED ORDER — SACUBITRIL-VALSARTAN 49-51 MG PO TABS
1.0000 | ORAL_TABLET | Freq: Two times a day (BID) | ORAL | Status: DC
  Administered 2014-02-01 – 2014-02-03 (×4): 1 via ORAL
  Filled 2014-02-01 (×5): qty 1

## 2014-02-01 NOTE — ED Notes (Signed)
Patient transported to X-ray 

## 2014-02-01 NOTE — ED Notes (Signed)
RN notified CBG 122

## 2014-02-01 NOTE — ED Notes (Signed)
Bilateral leg pain for several weeks, a/ox4, intermittent SHOB when climbing stairs. 8/10 pain

## 2014-02-01 NOTE — ED Notes (Signed)
Pt sitting in bed with legs elevated.

## 2014-02-01 NOTE — ED Provider Notes (Signed)
CSN: [id number]     Arrival date & time 02/01/14  2774 History   First MD Initiated Contact with Patient 02/01/14 [id number]     Chief Complaint  Patient presents with  . Ankle Pain  . Shortness of Breath     (Consider location/radiation/quality/duration/timing/severity/associated sxs/prior Treatment) Patient is a 63 y.o. male presenting with shortness of breath. The history is provided by the patient.  Shortness of Breath Severity:  Mild Onset quality:  Gradual Timing:  Constant Progression:  Worsening Chronicity: chronic w/ acute worsening. Context: activity   Relieved by:  Rest Worsened by:  Nothing tried Ineffective treatments:  None tried Associated symptoms: no abdominal pain, no chest pain, no cough, no headaches and no vomiting     Past Medical History  Diagnosis Date  . Cardiomyopathy     nonischemic (EF 25%)  . CAD (coronary artery disease)     nonobstructive CAD by cath 2009  . Obesity   . CVA (cerebral vascular accident)     CVA 2007 without residual deficit  . 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 Depression   . Erectile dysfunction   . DDD (degenerative disc disease)   . OSA (obstructive sleep apnea)   . Monoclonal gammopathy   . CRI (chronic renal insufficiency)     CRI (baseline creatinine 1.6)  . CKD (chronic kidney disease), stage III     moderate  . 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 Hypogonadotropic hypogonadism   . Subclinical hypothyroidism   . Polycythemia, secondary     improved/resolved  . Hypercholesterolemia   . Back pain     F/B [name]  . Monoclonal gammopathy of unknown significance     per [name]  . 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 Neck pain     F/B [name]  . PAF (paroxysmal atrial fibrillation) 06/04/10    Hx of, has device  . History of diverticulitis of colon   . Type  II or unspecified type diabetes mellitus with neurological manifestations, uncontrolled 2000  . Nonproliferative diabetic retinopathy NOS(362.03)   . Ventricular tachycardia 10/25/13    appropriate ICD shock, VT CL 230-240 msec   Past Surgical History  Procedure Laterality Date  . Gamma knife surgery for pituitary tumor    . Tonsillectomy    . Back surgery    . Cardiac defibrillator placement  02/19/10    By JA.   . Right heart catheterization N/A 09/17/2013    Procedure: RIGHT HEART CATH;  Surgeon: [name], MD;  Location: [facility] CATH LAB;  Service: Cardiovascular;  Laterality: N/A;   Family History  Problem Relation Age of Onset  . Lung cancer    . Cancer Mother     skin  . Dementia Mother   . Depression Mother   . Heart disease Father   . Hypertension Father   . Lung cancer Father   . Obesity Father   . Obesity Sister   . Hypertension Sister   . Diabetes Brother   . Hypertension Brother   . Heart disease Brother   . Obesity Brother   . Lung cancer Paternal Uncle   . Diabetes Paternal Uncle     requiring leg amputations    History  Substance Use Topics  . Smoking status: Former Smoker    Types: Cigars  Quit date: 02/09/1983  . Smokeless tobacco: Not on file     Comment: 25 yrs ago  . Alcohol Use: No    Review of Systems  HENT: Negative for drooling and rhinorrhea.   Eyes: Negative for pain.  Respiratory: Positive for chest tightness (with exertion) and shortness of breath. Negative for cough.   Cardiovascular: Negative for chest pain and leg swelling.  Gastrointestinal: Negative for nausea, vomiting, abdominal pain and diarrhea.  Genitourinary: Negative for dysuria and hematuria.  Musculoskeletal: Negative for gait problem.  Skin: Negative for color change.  Neurological: Negative for numbness and headaches.  Hematological: Negative for adenopathy.  Psychiatric/Behavioral: Negative for behavioral problems.  All other systems reviewed and are  negative.     Allergies  Bydureon and Losartan potassium  Home Medications   Prior to Admission medications   Medication Sig Start Date End Date Taking? Authorizing Provider  ALPRAZolam Duanne Moron) 0.5 MG tablet Take 0.5 mg by mouth 3 (three) times daily as needed for anxiety or sleep.   Yes Historical Provider, MD  atorvastatin (LIPITOR) 40 MG tablet Take 1 tablet (40 mg total) by mouth daily. 10/11/13  Yes [name], MD  digoxin (LANOXIN) 0.125 MG tablet Take 1 tablet (0.125 mg total) by mouth daily. 10/11/13  Yes [name], MD  DULoxetine (CYMBALTA) 30 MG capsule Take 60 mg by mouth 2 (two) times daily.    Yes Historical Provider, MD  insulin aspart (NOVOLOG) 100 UNIT/ML injection Inject 30 Units into the skin 3 (three) times daily before meals.    Yes Historical Provider, MD  LANTUS SOLOSTAR 100 UNIT/ML Solostar Pen Inject 60 Units into the skin daily. 11/03/13  Yes Historical Provider, MD  metoprolol succinate (TOPROL-XL) 100 MG 24 hr tablet 100 mg in am and 50 mg in pm 07/04/13  Yes Rande Brunt, NP  Multiple Vitamin (MULITIVITAMIN WITH MINERALS) TABS Take 1 tablet by mouth daily.   Yes Historical Provider, MD  oxyCODONE-acetaminophen (PERCOCET) 10-325 MG per tablet Take 1-2 tablets by mouth every 8 (eight) hours. For pain   Yes Historical Provider, MD  rivaroxaban (XARELTO) 20 MG TABS tablet Take 20 mg by mouth daily with supper.   Yes Historical Provider, MD  Sacubitril-Valsartan 49-51 MG TABS Take 1 tablet by mouth 2 (two) times daily. 12/19/13  Yes [name], MD  senna (SENOKOT) 8.6 MG tablet Take 1 tablet by mouth 2 (two) times daily.   Yes Historical Provider, MD  spironolactone (ALDACTONE) 25 MG tablet Take 1 tablet (25 mg total) by mouth daily. 10/09/13  Yes [name], MD  torsemide (DEMADEX) 20 MG tablet Take 2 tablets (40 mg total) by mouth daily. Patient taking differently: Take 20 mg by mouth 2 (two) times daily.  01/14/14  Yes [name], MD   zolpidem (AMBIEN) 10 MG tablet Take 10 mg by mouth at bedtime. For sleep   Yes Historical Provider, MD  gabapentin (NEURONTIN) 300 MG capsule Take 300 mg by mouth at bedtime. One to two tablets at bedtime as needed    Historical Provider, MD  GLUCAGON EMERGENCY 1 MG injection Inject 1 mg as directed once. For hypotension 01/30/14   Historical Provider, MD   BP 141/84 mmHg  Pulse 74  Temp(Src) 97.5 F (36.4 C) (Oral)  Resp 24  Ht 5\' 10"  (1.778 m)  Wt 275 lb (124.739 kg)  BMI 39.46 kg/m2  SpO2 96% Physical Exam  Constitutional: He is oriented to person, place, and time. He appears  well-developed and well-nourished.  obese  HENT:  Head: Normocephalic and atraumatic.  Right Ear: External ear normal.  Left Ear: External ear normal.  Nose: Nose normal.  Mouth/Throat: Oropharynx is clear and moist. No oropharyngeal exudate.  Eyes: Conjunctivae and EOM are normal. Pupils are equal, round, and reactive to light.  Neck: Normal range of motion. Neck supple.  Cardiovascular: Normal rate, regular rhythm, normal heart sounds and intact distal pulses.  Exam reveals no gallop and no friction rub.   No murmur heard. Pulmonary/Chest: Effort normal and breath sounds normal. No respiratory distress. He has no wheezes.  Abdominal: Soft. Bowel sounds are normal. He exhibits no distension. There is no tenderness. There is no rebound and no guarding.  Musculoskeletal: Normal range of motion. He exhibits edema (mild to moderate pitting edema bilateral lower extremities.). He exhibits no tenderness.  Neurological: He is alert and oriented to person, place, and time.  alert, oriented x3 speech: normal in context and clarity memory: intact grossly cranial nerves II-XII: intact motor strength: full proximally and distally no involuntary movements or tremors sensation: intact to light touch diffusely  cerebellar: finger-to-nose intact gait: normal forwards and backwards  Skin: Skin is warm and dry.   Psychiatric: He has a normal mood and affect. His behavior is normal.  Nursing note and vitals reviewed.   ED Course  Procedures (including critical care time) Labs Review Labs Reviewed  CBC WITH DIFFERENTIAL - Abnormal; Notable for the following:    MCV 100.9 (*)    All other components within normal limits  COMPREHENSIVE METABOLIC PANEL - Abnormal; Notable for the following:    Glucose, Bld 142 (*)    BUN 35 (*)    Creatinine, Ser 1.73 (*)    AST 38 (*)    GFR calc non Af Amer 41 (*)    GFR calc Af Amer 47 (*)    All other components within normal limits  TROPONIN I - Abnormal; Notable for the following:    Troponin I 0.18 (*)    All other components within normal limits  BRAIN NATRIURETIC PEPTIDE - Abnormal; Notable for the following:    B Natriuretic Peptide 2312.7 (*)    All other components within normal limits  URINALYSIS, ROUTINE W REFLEX MICROSCOPIC - Abnormal; Notable for the following:    Hgb urine dipstick SMALL (*)    Protein, ur >300 (*)    All other components within normal limits  DIGOXIN LEVEL - Abnormal; Notable for the following:    Digoxin Level 0.5 (*)    All other components within normal limits  URINE MICROSCOPIC-ADD ON - Abnormal; Notable for the following:    Casts HYALINE CASTS (*)    All other components within normal limits  CBG MONITORING, ED - Abnormal; Notable for the following:    Glucose-Capillary 122 (*)    All other components within normal limits    Imaging Review Dg Chest 2 View  02/01/2014   CLINICAL DATA:  Right ankle swelling, shortness of breath and chest pain  EXAM: CHEST  2 VIEW  COMPARISON:  05/19/2013  FINDINGS: Cardiac shadow is mildly enlarged. A pacing device is again seen and stable. Lungs are well aerated bilaterally without focal infiltrate or sizable effusion. No acute bony abnormality is seen.  IMPRESSION: No change from the prior exam.  No acute abnormality noted.   Electronically Signed   By: Inez Catalina M.D.   On:  02/01/2014 08:30   Ct Head Wo Contrast  02/01/2014  CLINICAL DATA:  Confusion.  EXAM: CT HEAD WITHOUT CONTRAST  TECHNIQUE: Contiguous axial images were obtained from the base of the skull through the vertex without intravenous contrast.  COMPARISON:  Head CT scan 05/19/2013.  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 There is no evidence of acute abnormality including infarct, hemorrhage, mass lesion, mass effect, midline shift or abnormal extra-axial fluid collection. The calvarium is intact. Imaged paranasal sinuses and mastoid air cells are clear.  IMPRESSION: No acute abnormality.  Pituitary adenoma, chronic microvascular ischemic change and remote right MCA territory infarct, unchanged.   Electronically Signed   By: Inge Rise M.D.   On: 02/01/2014 12:39     EKG Interpretation None      Date: 02/01/2014  Rate: 85  Rhythm: normal sinus rhythm  QRS Axis: left  Intervals: PR prolonged  ST/T Wave abnormalities: normal  Conduction Disutrbances:first-degree A-V block   Narrative Interpretation: no significant change  Old EKG Reviewed: unchanged  CRITICAL CARE Performed by: Pamella Pert, S Total critical care time: 30 min Critical care time was exclusive of separately billable procedures and treating other patients. Critical care was necessary to treat or prevent imminent or life-threatening deterioration. Critical care was time spent personally by me on the following activities: development of treatment plan with patient and/or surrogate as well as nursing, discussions with consultants, evaluation of patient's response to treatment, examination of patient, obtaining history from patient or surrogate, ordering and performing treatments and interventions, ordering and review of laboratory studies, ordering and review of radiographic studies, pulse oximetry and re-evaluation of patient's  condition.  MDM   Final diagnoses:  SOB (shortness of breath)  Confusion  CHF exacerbation  Elevated troponin    7:58 AM 63 y.o. male w hx of CAD, A fib on xarelto, CHF w/ ICD on digoxin, DM who presents with multiple complaints over the last few weeks. He notes increased shortness of breath with exertion and also chest tightness with exertion. He states that he has had orthopnea at night and is only able to lie down for several hours. His wife also notes occasional and intermittent confusion. He is a/o x 3 here w/ normal neuro exam, I suspect this is d/t hypoxia or metabolic in nature. He denies any fevers, cough, or chest pain currently. He states that his chest pain after exertion usually resolves after several minutes. He also notes increase peripheral edema on his lower extremities over the last few weeks. He appears well on exam, alert and oriented 3, neurologically intact. Vital signs are unremarkable here. We'll get screening labs and imaging.  Recent nuclear stress test 2 weeks ago showed: High risk stress nuclear study because of the LV dysfunction and previous large MI. There is no ischemia. LV Ejection Fraction: 23%. LV Wall Motion: The LV is markedly enlarged with distal anterior , apical, distal inferior and inferolaterlal akinesis.  Cards consulted who will admit. Newly elevated troponin.   Pamella Pert, MD 02/01/14 1332

## 2014-02-01 NOTE — ED Notes (Signed)
Ordered patient carb modified, low salt diet.

## 2014-02-01 NOTE — H&P (Signed)
Larry [name] is an 63 y.o. male.    Primary Cardiologist:Dr. Irish Lack Dr. Aundra Dubin in HF clinic Dr. Rayann Heman for EP  DGU:[id number] Marigene Ehlers, MD  Chief Complaint: Shortness of breath, leg pain  HPI: 63 yo with history of chronic systolic CHF (nonischemic cardiomyopathy), CKD, prior CVA, and paroxysmal atrial fibrillation. Patient says that he has been known to have a low EF for years. He has a [employer] officer, political party ICD. He had LHC in 2009 with nonobstructive disease. Last echo in 3/15 showed moderate LV dilation with EF 25-30%.   Patient had [place] 8/15 showing low cardiac output, mildly elevated PCWP and primarily pulmonary venous hypertension. He was transitioned from Lasix to torsemide and started on digoxin. Creatinine rose and Dr. Aundra Dubin had to cut back on his torsemide. He has not been able to wear CPAP in the past. Wife reports apneas at night up to 30 seconds. He had recent nuc study 01/17/14  High risk stress nuclear study because of the LV dysfunction and previous large MI. There is no ischemia..LV Ejection Fraction: 23%. LV Wall Motion: The LV is markedly enlarged with distal anterior , apical, distal inferior and inferolaterlal akinesis.  Pt presents to ER with complaints of DOE, not at rest, though approx 15 pound wt gain per pt. Wt 264.12 on the 7th now 275  Per records.  The swelling of abd and legs over last 3 weeks.  He is not aware of increased salt intake.  He does develop chest pressure with his DOE.  He is also diaphoretic.  He has been having lower glucose levels as well and recently saw Dr. Buddy Duty with decrease in insulin.  Another issue is recent confusion.  He had been placed on gabapentin for nueropathy and became confused so gabapentin was stopped but confusion continues at night.  He is also on xanax though this had been stopped on the gabapentin but now back on.  He takes ambien at [facility] and percocet for back pain.  He is also on Xarelto for PAF. (CHADS2VASC score is  at least 5)        EKG SR with large 1st degree AV block of 267 ms which is improved, no acute changes and he does pace at times. On last ICD check it had discharged once for VT.  In Sept.  He was instructed not to drive.     Dig level is 0.50. Troponin is 0.18.  Pro BNP 2312 CXR stable.   Past Medical History  Diagnosis Date  . Cardiomyopathy     nonischemic (EF 25%)  . CAD (coronary artery disease)     nonobstructive CAD by cath 2009  . Obesity   . CVA (cerebral vascular accident)     CVA 2007 without residual deficit  . 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 Depression   . Erectile dysfunction   . DDD (degenerative disc disease)   . OSA (obstructive sleep apnea)   . Monoclonal gammopathy   . CRI (chronic renal insufficiency)     CRI (baseline creatinine 1.6)  . CKD (chronic kidney disease), stage III     moderate  . 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 Hypogonadotropic hypogonadism   . Subclinical hypothyroidism   . Polycythemia, secondary     improved/resolved  . Hypercholesterolemia   . Back pain     F/B [name]  .  Monoclonal gammopathy of unknown significance     per [name]  . 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 Neck pain     F/B [name]  . PAF (paroxysmal atrial fibrillation) 06/04/10    Hx of, has device  . History of diverticulitis of colon   . Type II or unspecified type diabetes mellitus with neurological manifestations, uncontrolled 2000  . Nonproliferative diabetic retinopathy NOS(362.03)   . Ventricular tachycardia 10/25/13    appropriate ICD shock, VT CL 230-240 msec  . Confusion 02/01/2014    Past Surgical History  Procedure Laterality Date  . Gamma knife surgery for pituitary tumor    . Tonsillectomy    . Back surgery    . Cardiac defibrillator placement  02/19/10    By JA.   . Right heart  catheterization N/A 09/17/2013    Procedure: RIGHT HEART CATH;  Surgeon: [name], MD;  Location: [facility] CATH LAB;  Service: Cardiovascular;  Laterality: N/A;    Family History  Problem Relation Age of Onset  . Lung cancer    . Cancer Mother     skin  . Dementia Mother   . Depression Mother   . Heart disease Father   . Hypertension Father   . Lung cancer Father   . Obesity Father   . Obesity Sister   . Hypertension Sister   . Diabetes Brother   . Hypertension Brother   . Heart disease Brother   . Obesity Brother   . Lung cancer Paternal Uncle   . Diabetes Paternal Uncle     requiring leg amputations    Social History:  reports that he quit smoking about 31 years ago. His smoking use included Cigars. He does not have any smokeless tobacco history on file. He reports that he does not drink alcohol or use illicit drugs. Lives with his wife.  Allergies:  Allergies  Allergen Reactions  . Bydureon [Exenatide] Other (See Comments)    sweating  . Losartan Potassium Other (See Comments)    insomnia    OUT PT MEDS: No current facility-administered medications on file prior to encounter.   Current Outpatient Prescriptions on File Prior to Encounter  Medication Sig Dispense Refill  . atorvastatin (LIPITOR) 40 MG tablet Take 1 tablet (40 mg total) by mouth daily. 90 tablet 1  . digoxin (LANOXIN) 0.125 MG tablet Take 1 tablet (0.125 mg total) by mouth daily. 90 tablet 1  . DULoxetine (CYMBALTA) 30 MG capsule Take 60 mg by mouth 2 (two) times daily.     . insulin aspart (NOVOLOG) 100 UNIT/ML injection Inject 30 Units into the skin 3 (three) times daily before meals.     [name] LANTUS SOLOSTAR 100 UNIT/ML Solostar Pen Inject 60 Units into the skin daily.    . metoprolol succinate (TOPROL-XL) 100 MG 24 hr tablet 100 mg in am and 50 mg in pm    . Multiple Vitamin (MULITIVITAMIN WITH MINERALS) TABS Take 1 tablet by mouth daily.    [name] oxyCODONE-acetaminophen (PERCOCET) 10-325 MG per tablet  Take 1-2 tablets by mouth every 8 (eight) hours. For pain    . rivaroxaban (XARELTO) 20 MG TABS tablet Take 20 mg by mouth daily with supper.    . Sacubitril-Valsartan 49-51 MG TABS Take 1 tablet by mouth 2 (two) times daily. 180 tablet 2  . senna (SENOKOT) 8.6 MG tablet Take 1 tablet by mouth 2 (two) times daily.    [name]  spironolactone (ALDACTONE) 25 MG tablet Take 1 tablet (25 mg total) by mouth daily. 30 tablet 6  . torsemide (DEMADEX) 20 MG tablet Take 2 tablets (40 mg total) by mouth daily. (Patient taking differently: Take 20 mg by mouth 2 (two) times daily. )    . zolpidem (AMBIEN) 10 MG tablet Take 10 mg by mouth at bedtime. For sleep    . gabapentin (NEURONTIN) 300 MG capsule Take 300 mg by mouth at bedtime. One to two tablets at bedtime as needed     NO LONGER ON GABAPENTIN   Results for orders placed or performed during the hospital encounter of 02/01/14 (from the past 48 hour(s))  CBC with Differential     Status: Abnormal   Collection Time: 02/01/14  7:58 AM  Result Value Ref Range   WBC 8.2 4.0 - 10.5 K/uL   RBC 4.56 4.22 - 5.81 MIL/uL   Hemoglobin 14.6 13.0 - 17.0 g/dL   HCT 46.0 39.0 - 52.0 %   MCV 100.9 (H) 78.0 - 100.0 fL   MCH 32.0 26.0 - 34.0 pg   MCHC 31.7 30.0 - 36.0 g/dL   RDW 14.9 11.5 - 15.5 %   Platelets 249 150 - 400 K/uL   Neutrophils Relative % 59 43 - 77 %   Neutro Abs 4.9 1.7 - 7.7 K/uL   Lymphocytes Relative 28 12 - 46 %   Lymphs Abs 2.3 0.7 - 4.0 K/uL   Monocytes Relative 9 3 - 12 %   Monocytes Absolute 0.7 0.1 - 1.0 K/uL   Eosinophils Relative 3 0 - 5 %   Eosinophils Absolute 0.2 0.0 - 0.7 K/uL   Basophils Relative 1 0 - 1 %   Basophils Absolute 0.1 0.0 - 0.1 K/uL  Comprehensive metabolic panel     Status: Abnormal   Collection Time: 02/01/14  7:58 AM  Result Value Ref Range   Sodium 137 135 - 145 mmol/L    Comment: Please note change in reference range.   Potassium 4.7 3.5 - 5.1 mmol/L    Comment: Please note change in reference range.    Chloride 100 96 - 112 mEq/L   CO2 27 19 - 32 mmol/L   Glucose, Bld 142 (H) 70 - 99 mg/dL   BUN 35 (H) 6 - 23 mg/dL   Creatinine, Ser 1.73 (H) 0.50 - 1.35 mg/dL   Calcium 9.7 8.4 - 10.5 mg/dL   Total Protein 8.3 6.0 - 8.3 g/dL   Albumin 3.9 3.5 - 5.2 g/dL   AST 38 (H) 0 - 37 U/L   ALT 44 0 - 53 U/L   Alkaline Phosphatase 88 39 - 117 U/L   Total Bilirubin 0.8 0.3 - 1.2 mg/dL   GFR calc non Af Amer 41 (L) >90 mL/min   GFR calc Af Amer 47 (L) >90 mL/min    Comment: (NOTE) The eGFR has been calculated using the CKD EPI equation. This calculation has not been validated in all clinical situations. eGFR's persistently <90 mL/min signify possible Chronic Kidney Disease.    Anion gap 10 5 - 15  Troponin I     Status: Abnormal   Collection Time: 02/01/14  7:58 AM  Result Value Ref Range   Troponin I 0.18 (H) <0.031 ng/mL    Comment:        PERSISTENTLY INCREASED TROPONIN VALUES IN THE RANGE OF 0.04-0.49 ng/mL CAN BE SEEN IN:       -UNSTABLE ANGINA       -  CONGESTIVE HEART FAILURE       -MYOCARDITIS       -CHEST TRAUMA       -ARRYHTHMIAS       -LATE PRESENTING MYOCARDIAL INFARCTION       -COPD   CLINICAL FOLLOW-UP RECOMMENDED. Please note change in reference range.   Brain natriuretic peptide     Status: Abnormal   Collection Time: 02/01/14  7:58 AM  Result Value Ref Range   B Natriuretic Peptide 2312.7 (H) 0.0 - 100.0 pg/mL    Comment: Please note change in reference range.  Digoxin level     Status: Abnormal   Collection Time: 02/01/14  7:58 AM  Result Value Ref Range   Digoxin Level 0.5 (L) 0.8 - 2.0 ng/mL  Urinalysis, Routine w reflex microscopic     Status: Abnormal   Collection Time: 02/01/14  8:05 AM  Result Value Ref Range   Color, Urine YELLOW YELLOW   APPearance CLEAR CLEAR   Specific Gravity, Urine 1.015 1.005 - 1.030   pH 5.5 5.0 - 8.0   Glucose, UA NEGATIVE NEGATIVE mg/dL   Hgb urine dipstick SMALL (A) NEGATIVE   Bilirubin Urine NEGATIVE NEGATIVE   Ketones,  ur NEGATIVE NEGATIVE mg/dL   Protein, ur >300 (A) NEGATIVE mg/dL   Urobilinogen, UA 0.2 0.0 - 1.0 mg/dL   Nitrite NEGATIVE NEGATIVE   Leukocytes, UA NEGATIVE NEGATIVE  Urine microscopic-add on     Status: Abnormal   Collection Time: 02/01/14  8:05 AM  Result Value Ref Range   RBC / HPF 0-2 <3 RBC/hpf   Casts HYALINE CASTS (A) NEGATIVE   Dg Chest 2 View  02/01/2014   CLINICAL DATA:  Right ankle swelling, shortness of breath and chest pain  EXAM: CHEST  2 VIEW  COMPARISON:  05/19/2013  FINDINGS: Cardiac shadow is mildly enlarged. A pacing device is again seen and stable. Lungs are well aerated bilaterally without focal infiltrate or sizable effusion. No acute bony abnormality is seen.  IMPRESSION: No change from the prior exam.  No acute abnormality noted.   Electronically Signed   By: Inez Catalina M.D.   On: 02/01/2014 08:30    ROS: General:no colds or fevers, 15 lb weight increase per pt Skin:no rashes or ulcers, areas of irritation that pt picks HEENT:no blurred vision, no congestion CV:see HPI PUL:see HPI GI:+ diarrhea over last 1-2 days 4 stools a day, no constipation or melena, no indigestion GU:no hematuria, no dysuria MS:no joint pain, no claudication Neuro:no syncope, no lightheadedness Endo:+ hypoglycemic diabetes, no thyroid disease   Blood pressure 149/89, pulse 77, temperature 97.5 F (36.4 C), temperature source Oral, resp. rate 14, height $RemoveBe'5\' 10"'iPFHTCNNa$  (1.778 m), weight 275 lb (124.739 kg), SpO2 96 %. PE: General:Pleasant affect, NAD, not comfortable on stretcher, walking in room Skin:Warm and dry, brisk capillary refill HEENT:normocephalic, sclera clear, mucus membranes moist Neck:supple, mild JVD, no bruits, no adenopathy  Heart:muffled heart sounds S1S2 RRR without murmur, gallup, rub or click Lungs: with few rales in the bases, no rhonchi, or wheezes GQQ:PYPPJ, soft, non tender, + BS, do not palpate liver spleen or masses Ext:2+ lower ext edema to just below the knees,  2+ pedal pulses, 2+ radial pulses Neuro:alert and oriented X 3, MAE, follows commands, + facial symmetry, tongue midline, upper extremity strength strong and equal, lower ext. Strength strong and equal.   EKG:   EKG reveals normal sinus rhythm. There is no acute change.  Assessment/Plan Principal Problem:   Acute on chronic  systolic congestive heart failure, NYHA class 3 will admit and diuresis with IV lasix monitor kidney function  Active Problems:   Chest pain of uncertain etiology- elevated troponin may be demand ischemia, recent nuc study without ischemia, will follow tropnins.     Automatic implantable cardioverter-defibrillator in situ- last interrogated in OCt.     CKD (chronic kidney disease) stage 3, GFR 30-59 ml/min    DM2 (diabetes mellitus, type 2)- has been hypoglycemic, with decrease in insulin- will hold meal coverage for now and add SSI but if glucose elevated may need to resume meal coverage     Depression with anxiety- takes xanax and Cymbalta     OSA (obstructive sleep apnea), intolerant to CPAP/BIPAP    Ventricular tachycardia- with ICD shock in Sept- approp    Chronic anticoagulation, on Xarelto, CHADSVASC 5 score- continue    Confusion- new since he was placed on gabapentin, now this has been stopped but confused during the  Night  May be due to hypoxia then will monitor sp02 at night. Has not tolerated CPAP and BiPAP but may need home oxygen at HS, though he does forget things during the day as well. Check CT head to make sure no abnormalities, may need neurology consult.    History of paroxysmal atrial fibrillation.......... the patient is anticoagulated. Currently there is sinus rhythm.    [place] Practitioner Certified [place] Pager [phone] or after 5pm or weekends call [phone] 02/01/2014, 11:45 AM Patient seen and examined. I agree with the assessment and plan as detailed above. See also my additional thoughts  below.   I have added my thoughts to the assessment/plan above. The patient is definitely total body volume overloaded. I'm hopeful that we will be able to diurese him. CT of the head will be done to be sure that there is been no acute change as the basis of some of his mental status changes. I spoke with the patient and his wife in the room.   Dola Argyle, MD, [facility] 02/01/2014 12:18 PM

## 2014-02-01 NOTE — Progress Notes (Signed)
The patient had a 5 beat run of V tach.  The patient was asymptomatic and his VS are stable.  The MD was notified.

## 2014-02-02 DIAGNOSIS — R7989 Other specified abnormal findings of blood chemistry: Secondary | ICD-10-CM

## 2014-02-02 DIAGNOSIS — R778 Other specified abnormalities of plasma proteins: Secondary | ICD-10-CM | POA: Insufficient documentation

## 2014-02-02 DIAGNOSIS — N183 Chronic kidney disease, stage 3 (moderate): Secondary | ICD-10-CM

## 2014-02-02 LAB — BASIC METABOLIC PANEL
Anion gap: 9 (ref 5–15)
Anion gap: 7 (ref 5–15)
BUN: 35 mg/dL — ABNORMAL HIGH (ref 6–23)
BUN: 32 mg/dL — ABNORMAL HIGH (ref 6–23)
CALCIUM: 8.8 mg/dL (ref 8.4–10.5)
CO2: 27 mmol/L (ref 19–32)
CO2: 30 mmol/L (ref 19–32)
Calcium: 8.8 mg/dL (ref 8.4–10.5)
Chloride: 98 mEq/L (ref 96–112)
Chloride: 99 mEq/L (ref 96–112)
Creatinine, Ser: 1.63 mg/dL — ABNORMAL HIGH (ref 0.50–1.35)
Creatinine, Ser: 1.67 mg/dL — ABNORMAL HIGH (ref 0.50–1.35)
GFR calc Af Amer: 51 mL/min — ABNORMAL LOW (ref >90)
GFR calc Af Amer: 49 mL/min — ABNORMAL LOW (ref >90)
GFR calc non Af Amer: 43 mL/min — ABNORMAL LOW (ref >90)
GFR, EST NON AFRICAN AMERICAN: 44 mL/min — AB (ref >90)
GLUCOSE: 156 mg/dL — AB (ref 70–99)
Glucose, Bld: 174 mg/dL — ABNORMAL HIGH (ref 70–99)
POTASSIUM: 4.4 mmol/L (ref 3.5–5.1)
Potassium: 3.9 mmol/L (ref 3.5–5.1)
SODIUM: 134 mmol/L — AB (ref 135–145)
Sodium: 136 mmol/L (ref 135–145)

## 2014-02-02 LAB — DIGOXIN LEVEL: Digoxin Level: 0.6 ng/mL — ABNORMAL LOW (ref 0.8–2.0)

## 2014-02-02 LAB — GLUCOSE, CAPILLARY
GLUCOSE-CAPILLARY: 153 mg/dL — AB (ref 70–99)
GLUCOSE-CAPILLARY: 150 mg/dL — AB (ref 70–99)
GLUCOSE-CAPILLARY: 166 mg/dL — AB (ref 70–99)
Glucose-Capillary: 97 mg/dL (ref 70–99)

## 2014-02-02 LAB — CLOSTRIDIUM DIFFICILE BY PCR: Toxigenic C. Difficile by PCR: NEGATIVE

## 2014-02-02 LAB — TROPONIN I: Troponin I: 0.14 ng/mL — ABNORMAL HIGH (ref <0.031)

## 2014-02-02 MED ORDER — METOPROLOL SUCCINATE ER 100 MG PO TB24
100.0000 mg | ORAL_TABLET | Freq: Every day | ORAL | Status: DC
  Administered 2014-02-03: 100 mg via ORAL
  Filled 2014-02-02 (×2): qty 1

## 2014-02-02 MED ORDER — INFLUENZA VAC SPLIT QUAD 0.5 ML IM SUSY
0.5000 mL | PREFILLED_SYRINGE | INTRAMUSCULAR | Status: AC
  Administered 2014-02-03: 0.5 mL via INTRAMUSCULAR
  Filled 2014-02-02: qty 0.5

## 2014-02-02 NOTE — Progress Notes (Signed)
Echocardiogram 2D Echocardiogram has been performed.  Joelene Millin 02/02/2014, 10:45 AM

## 2014-02-02 NOTE — Progress Notes (Addendum)
Patient ID: Larry [name], male   DOB: [birth year], 63 y.o.   MRN: [id number]   SUBJECTIVE: Patient diuresed well yesterday, weight is down, creatinine stable.    Filed Vitals:   02/01/14 2206 02/02/14 0015 02/02/14 0510 02/02/14 0626  BP: 112/69 119/66  121/85  Pulse: 84 83  80  Temp: 97.6 F (36.4 C) 98.5 F (36.9 C)  97.1 F (36.2 C)  TempSrc: Oral Oral  Oral  Resp: 20     Height:      Weight:   262 lb 14.4 oz (119.251 kg)   SpO2: 95% 96%      Intake/Output Summary (Last 24 hours) at 02/02/14 0830 Last data filed at 02/02/14 0700  Gross per 24 hour  Intake    720 ml  Output   2425 ml  Net  -1705 ml    LABS: Basic Metabolic Panel:  Recent Labs  02/01/14 0758 02/01/14 1537 02/02/14 0139  NA 137  --  134*  K 4.7  --  4.4  CL 100  --  98  CO2 27  --  27  GLUCOSE 142*  --  156*  BUN 35*  --  35*  CREATININE 1.73*  --  1.63*  CALCIUM 9.7  --  8.8  MG  --  2.2  --    Liver Function Tests:  Recent Labs  02/01/14 0758  AST 38*  ALT 44  ALKPHOS 88  BILITOT 0.8  PROT 8.3  ALBUMIN 3.9   No results for input(s): LIPASE, AMYLASE in the last 72 hours. CBC:  Recent Labs  02/01/14 0758  WBC 8.2  NEUTROABS 4.9  HGB 14.6  HCT 46.0  MCV 100.9*  PLT 249   Cardiac Enzymes:  Recent Labs  02/01/14 1342 02/01/14 1947 02/02/14 0133  TROPONINI 0.12* 0.14* 0.14*   BNP: Invalid input(s): POCBNP D-Dimer: No results for input(s): DDIMER in the last 72 hours. Hemoglobin A1C: No results for input(s): HGBA1C in the last 72 hours. Fasting Lipid Panel: No results for input(s): CHOL, HDL, LDLCALC, TRIG, CHOLHDL, LDLDIRECT in the last 72 hours. Thyroid Function Tests:  Recent Labs  02/01/14 1537  TSH 7.088*   Anemia Panel: No results for input(s): VITAMINB12, FOLATE, FERRITIN, TIBC, IRON, RETICCTPCT in the last 72 hours.  RADIOLOGY: Dg Chest 2 View  02/01/2014   CLINICAL DATA:  Right ankle swelling, shortness of breath and chest pain  EXAM: CHEST  2  VIEW  COMPARISON:  05/19/2013  FINDINGS: Cardiac shadow is mildly enlarged. A pacing device is again seen and stable. Lungs are well aerated bilaterally without focal infiltrate or sizable effusion. No acute bony abnormality is seen.  IMPRESSION: No change from the prior exam.  No acute abnormality noted.   Electronically Signed   By: Inez Catalina M.D.   On: 02/01/2014 08:30   Ct Head Wo Contrast  02/01/2014   CLINICAL DATA:  Confusion.  EXAM: CT HEAD WITHOUT CONTRAST  TECHNIQUE: Contiguous axial images were obtained from the base of the skull through the vertex without intravenous contrast.  COMPARISON:  Head CT scan 05/19/2013.  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 There is no evidence of acute abnormality including infarct, hemorrhage, mass lesion, mass effect, midline shift or abnormal extra-axial fluid collection. The calvarium is intact. Imaged paranasal sinuses and mastoid air cells are clear.  IMPRESSION: No acute abnormality.  Pituitary adenoma, chronic microvascular ischemic change and remote right  MCA territory infarct, unchanged.   Electronically Signed   By: Inge Rise M.D.   On: 02/01/2014 12:39    PHYSICAL EXAM General: NAD Neck: JVP 10 cm, no thyromegaly or thyroid nodule.  Lungs: Mildly decreased breath sounds at bases. CV: Nondisplaced PMI.  Heart regular S1/S2, no S3/S4, no murmur.  1+ edema to knees.   Abdomen: Soft, nontender, no hepatosplenomegaly, no distention.  Neurologic: Alert and oriented x 3.  Psych: Normal affect. Extremities: No clubbing or cyanosis.   TELEMETRY: Reviewed telemetry pt in NSR  ASSESSMENT AND PLAN: 1. Acute on chronic systolic CHF: Nonischemic cardiomyopathy, known for years. EF has been stable at 25-30% (04/2013). Has ST Jude ICD, narrow QRS so not CRT candidate. CPX VO2 low but slope ok. Interestingly, he had a Cardiolite done earlier this  month with evidence for prior infarct but no ischemia (EF 23%).  Cardiac cath not done with CKD and no ischemia.  He was admitted on 12/25 with weight gain, dyspnea, and lower extremity swelling.  - Continue Lasix 80 mg IV bid.  Diuresed well yesterday, still volume overloaded.  Will likely need more diuretic at home. Will see how he does with diuresis, may need RHC prior to discharge.  - Continue digoxin give low output on prior RHC. Digoxin level ok this admission. - Continue Toprol XL 100 qam/50 qpm and current spironolactone. - Continue Entresto 49/51 bid.  - We have discussed the fact that he may be nearing the need for advanced therapies. Weight will be an issue for transplant. Will see how he does with med adjustments but may need to move towards LVAD vs transplant in near future.  2. CKD: Creatinine stable, follow closely.  3. Atrial fibrillation: Paroxysmal. He has not been in atrial fibrillation this admission.  Has prior history of CVA.  He is on Xarelto, creatinine clearance borderline, may need to decrease to 15 mg daily.  Will follow creatinine here.  4. DM2: Continue home regimen.  Control has not been ideal.  Will involve diabetes coordinator.   5. Depression: Seems to be ongoing issue.  6. OSA: Has not tolerated CPAP in the past.  Was supposed to have repeat sleep study 12/23 but rescheduled.  Will arrange for overnight oximetry in the hospital this admission, may be able to at least start him on nocturnal oxygen at home.  7. Elevated troponin: Mild elevation, no trend.  Suspect demand ischemia due to volume overload.  However, had large area of scar on recent Cardiolite.  Had been assumed to have nonischemic cardiomyopathy based on 2009 cath but I am concerned that he has significant CAD.  Given lack of ischemia on recent Cardiolite and no chest pain now, would not do angiography given elevated creatinine.  At some point in the future when creatinine stable, would be helpful to  assess coronaries.   Loralie Champagne 02/02/2014 8:38 AM

## 2014-02-03 DIAGNOSIS — I480 Paroxysmal atrial fibrillation: Secondary | ICD-10-CM

## 2014-02-03 DIAGNOSIS — I4891 Unspecified atrial fibrillation: Secondary | ICD-10-CM | POA: Diagnosis not present

## 2014-02-03 LAB — BASIC METABOLIC PANEL
Anion gap: 9 (ref 5–15)
BUN: 35 mg/dL — ABNORMAL HIGH (ref 6–23)
CO2: 34 mmol/L — ABNORMAL HIGH (ref 19–32)
Calcium: 9.4 mg/dL (ref 8.4–10.5)
Chloride: 96 mEq/L (ref 96–112)
Creatinine, Ser: 1.71 mg/dL — ABNORMAL HIGH (ref 0.50–1.35)
GFR, EST AFRICAN AMERICAN: 48 mL/min — AB (ref >90)
GFR, EST NON AFRICAN AMERICAN: 41 mL/min — AB (ref >90)
GLUCOSE: 178 mg/dL — AB (ref 70–99)
Potassium: 4.5 mmol/L (ref 3.5–5.1)
SODIUM: 139 mmol/L (ref 135–145)

## 2014-02-03 LAB — CBC
HCT: 44.1 % (ref 39.0–52.0)
Hemoglobin: 13.8 g/dL (ref 13.0–17.0)
MCH: 31.4 pg (ref 26.0–34.0)
MCHC: 31.3 g/dL (ref 30.0–36.0)
MCV: 100.5 fL — ABNORMAL HIGH (ref 78.0–100.0)
PLATELETS: 245 10*3/uL (ref 150–400)
RBC: 4.39 MIL/uL (ref 4.22–5.81)
RDW: 14.8 % (ref 11.5–15.5)
WBC: 6.2 10*3/uL (ref 4.0–10.5)

## 2014-02-03 LAB — T3, FREE: T3, Free: 2.9 pg/mL (ref 2.3–4.2)

## 2014-02-03 LAB — GLUCOSE, CAPILLARY
GLUCOSE-CAPILLARY: 162 mg/dL — AB (ref 70–99)
GLUCOSE-CAPILLARY: 170 mg/dL — AB (ref 70–99)

## 2014-02-03 LAB — TSH: TSH: 8.596 u[IU]/mL — AB (ref 0.350–4.500)

## 2014-02-03 LAB — T4, FREE: Free T4: 0.87 ng/dL (ref 0.80–1.80)

## 2014-02-03 MED ORDER — AMIODARONE HCL 200 MG PO TABS: 200.0000 mg | ORAL_TABLET | Freq: Two times a day (BID) | ORAL | Status: DC

## 2014-02-03 MED ORDER — AMIODARONE HCL 200 MG PO TABS
200.0000 mg | ORAL_TABLET | Freq: Two times a day (BID) | ORAL | Status: DC
  Administered 2014-02-03: 200 mg via ORAL
  Filled 2014-02-03 (×2): qty 1

## 2014-02-03 MED ORDER — TORSEMIDE 20 MG PO TABS: 60.0000 mg | ORAL_TABLET | Freq: Every day | ORAL | Status: DC

## 2014-02-03 MED ORDER — ACETAMINOPHEN 325 MG PO TABS: 650.0000 mg | ORAL_TABLET | ORAL | Status: DC | PRN

## 2014-02-03 MED ORDER — TORSEMIDE 20 MG PO TABS: 60.0000 mg | ORAL_TABLET | Freq: Two times a day (BID) | ORAL | Status: DC

## 2014-02-03 NOTE — Progress Notes (Signed)
Patient was placed on an overnight pulse ox at approximately 23:05.

## 2014-02-03 NOTE — Discharge Summary (Signed)
Physician Discharge Summary       Patient ID: Larry [name] MRN: [id number] DOB/AGE: [birth year] 63 y.o.  Admit date: 02/01/2014 Discharge date: 02/03/2014   Primary Cardiologist:Dr. Aundra Dubin   Discharge Diagnoses:  Principal Problem:   Acute on chronic systolic congestive heart failure, NYHA class 3 Active Problems:   Chest pain of uncertain etiology   PAF (paroxysmal atrial fibrillation)   Automatic implantable cardioverter-defibrillator in situ   CKD (chronic kidney disease) stage 3, GFR 30-59 ml/min   DM2 (diabetes mellitus, type 2)   Depression with anxiety   OSA (obstructive sleep apnea), intolerant to CPAP/BIPAP   Ventricular tachycardia   Chronic anticoagulation, on Xarelto, CHADSVASC 5 score   Confusion   Acute on chronic systolic HF (heart failure)   Elevated troponin   Atrial fibrillation- pt went into a fib at discharge 02/03/14   Discharged Condition: good  Procedures: none  Hospital Course: 63 yo with history of chronic systolic CHF (nonischemic cardiomyopathy), CKD, prior CVA, and paroxysmal atrial fibrillation. Patient says that he has been known to have a low EF for years. He has a [employer], [employer] ICD. He had LHC in 2009 with nonobstructive disease. Last echo in 3/15 showed moderate LV dilation with EF 25-30%.   Patient had [place] 8/15 showing low cardiac output, mildly elevated PCWP and primarily pulmonary venous hypertension. He was transitioned from Lasix to torsemide and started on digoxin. Creatinine rose and Dr. Aundra Dubin had to cut back on his torsemide. He has not been able to wear CPAP in the past. Wife reports apneas at night up to 30 seconds. He had recent nuc study 01/17/14 High risk stress nuclear study because of the LV dysfunction and previous large MI. There is no ischemia..LV Ejection Fraction: 23%. LV Wall Motion: The LV is markedly enlarged with distal anterior , apical, distal inferior and inferolaterlal akinesis.  Pt presented to ER  02/01/14 with complaints of DOE, not at rest, though approx 15 pound wt gain per pt. Wt 264.12 on the 7th now 275 Per records. The swelling of abd and legs over last 3 weeks. He is not aware of increased salt intake. He does develop chest pressure with his DOE. He is also diaphoretic. He has been having lower glucose levels as well and recently saw Dr. Buddy Duty with decrease in insulin. Another issue is recent confusion. He had been placed on gabapentin for nueropathy and became confused so gabapentin was stopped but confusion continues at night. He is also on xanax though this had been stopped on the gabapentin but now back on. He [facility] ambien at [facility] and percocet for back pain. He is also on Xarelto for PAF. (CHADS2VASC score is at least 5)     EKG SR with large 1st degree AV block of 267 ms which is improved, no acute changes and he does pace at times. On last ICD check it had discharged once for VT. In Sept. He was instructed not to drive.   Dig level is 0.50. Troponin is 0.18. Pro BNP 2312 CXR stable.  1. Acute on chronic systolic CHF: Nonischemic cardiomyopathy, known for years. Echo this admission with moderate to severe LV dilation, EF 20-25%. Has St Jude ICD, narrow QRS so not CRT candidate. CPX VO2 low but slope ok. Interestingly, he had a Cardiolite done earlier this month with evidence for prior infarct but no ischemia (EF 23%). Cardiac cath not done with CKD and no ischemia. He was admitted on 12/25 with weight gain, dyspnea, and lower  extremity swelling.  - He diuresed well yesterday, weight is down considerably and he feels better. Still some volume but improved, he wants to go home. Will give IV Lasix this morning then increase his home torsemide dose to 60 mg daily, starting tomorrow.  - Continue digoxin give low output on prior RHC. Digoxin level ok this admission. - Continue Toprol XL 100 qam/50 qpm and current spironolactone. - Continue Entresto 49/51 bid.  -  We have discussed the fact that he may be nearing the need for advanced therapies. Weight will be an issue for transplant. Will see how he does with med adjustments but may need to move towards LVAD vs transplant in near future.   2. CKD: Creatinine stable, follow closely.   3. Atrial fibrillation: Paroxysmal. He is on Xarelto. He is in atrial fibrillation with rate around 100 this morning (NSR at admission). By prior ICD interrogations, he has occasional episodes of atrial fibrillation that tend to be brief.He was started on amiodarone 200 mg bid. If he does not come out of atrial fibrillation on his own by followup in 1 week in the office, Dr. Vilinda Flake arrange DCCV. He has been on Xarelto without missing doses.   4. DM2: Continue home regimen. Control has not been ideal. Needs continued work.   5. Depression: Seems to be ongoing issue.   6. OSA: Has not tolerated CPAP in the past. Was supposed to have repeat sleep study 12/23 but rescheduled. Overnight oximetry in the hospital did not show sats < 94% on room air, so does not appear to be a role for nocturnal oxygen.   7. Elevated troponin: Mild elevation, no trend. Suspect demand ischemia due to volume overload. However, had large area of scar on recent Cardiolite. Had been assumed to have nonischemic cardiomyopathy based on 2009 cath but I am concerned that he has significant CAD. Given lack of ischemia on recent Cardiolite and no chest pain now, would not do angiography given elevated creatinine. At some point in the future when creatinine stable, would be helpful to assess coronaries.   8. Hypothyroidism: Mildly elevated TSH. Will repeat TSH and free T4/free T3- pending at discharge. Will need to follow closely with initiation of amiodarone, may need thyroid replacement.   9. Confusion- CT of his head without acute abnormalities.  10.  ICD  I&O for admit -5385  Discharge wt: 256.6 down from admit of 275   63  will follow up in 1 week with Heart Failure clinic.  Will need BMP at that time and review of his thyroid.  Labs pending at discharge.      Consults: cardiology  Significant Diagnostic Studies:  BMET    Component Value Date/Time   NA 139 02/03/2014 0355   K 4.5 02/03/2014 0355   CL 96 02/03/2014 0355   CO2 34* 02/03/2014 0355   GLUCOSE 178* 02/03/2014 0355   BUN 35* 02/03/2014 0355   CREATININE 1.71* 02/03/2014 0355   CALCIUM 9.4 02/03/2014 0355   GFRNONAA 41* 02/03/2014 0355   GFRAA 48* 02/03/2014 0355    CBC    Component Value Date/Time   WBC 6.2 02/03/2014 0355   RBC 4.39 02/03/2014 0355   HGB 13.8 02/03/2014 0355   HCT 44.1 02/03/2014 0355   PLT 245 02/03/2014 0355   MCV 100.5* 02/03/2014 0355   MCH 31.4 02/03/2014 0355   MCHC 31.3 02/03/2014 0355   RDW 14.8 02/03/2014 0355   LYMPHSABS 2.3 02/01/2014 0758   MONOABS 0.7 02/01/2014 0758  EOSABS 0.2 02/01/2014 0758   BASOSABS 0.1 02/01/2014 0758   Dig level 0.6  Troponin 0.28 to 0.14 see note above  TSH 7.088  Free T4 and T3 pending  BNP (last 3 results)  Recent Labs  10/09/13 0926 11/14/13 0926 01/14/14 1112  PROBNP 1561.0* 1261.0* 5769.0*   Hepatic Function Latest Ref Rng 02/01/2014 05/19/2013 07/20/2010  Total Protein 6.0 - 8.3 g/dL 8.3 7.9 7.5  Albumin 3.5 - 5.2 g/dL 3.9 3.9 3.8  AST 0 - 37 U/L 38(H) 14 15  ALT 0 - 53 U/L 44 17 21  Alk Phosphatase 39 - 117 U/L 88 80 75  Total Bilirubin 0.3 - 1.2 mg/dL 0.8 0.4 0.9  Bilirubin, Direct 0.0 - 0.3 mg/dL - <0.2 -      CRP 0.7  CT Head for confusion FINDINGS: Remote posterior MCA territory infarct on the right, chronic microvascular ischemic change and pituitary adenoma are again seen. There is no evidence of acute abnormality including infarct, hemorrhage, mass lesion, mass effect, midline shift or abnormal extra-axial fluid collection. The calvarium is intact. Imaged paranasal sinuses and mastoid air cells are clear. IMPRESSION: No acute  abnormality. Pituitary adenoma, chronic microvascular ischemic change and remote right MCA territory infarct, unchanged.   EKG: Atrial fibrillation with rapid ventricular response- new day of discharge Septal infarct , age undetermined Lateral infarct , age undetermined Inferior infarct , age undetermined Abnormal ECG   ECHO:  - Left ventricle: The cavity size was moderately to severely dilated. Systolic function was severely reduced. The estimated ejection fraction was in the range of 20% to 25%. Akinesis of the inferior myocardium. - Aorta: The aorta was mildly dilated. - Left atrium: The atrium was severely dilated.  Discharge Exam: Blood pressure 102/62, pulse 59, temperature 98.3 F (36.8 C), temperature source Oral, resp. rate 20, height 5' 10.5" (1.791 m), weight 256 lb 6.3 oz (116.3 kg), SpO2 97 %.  Disposition: 01-Home or Self Care     Medication List    TAKE these medications        acetaminophen 325 MG tablet  Commonly known as:  TYLENOL  Take 2 tablets (650 mg total) by mouth every 4 (four) hours as needed for headache or mild pain.     ALPRAZolam 0.5 MG tablet  Commonly known as:  XANAX  Take 0.5 mg by mouth 3 (three) times daily as needed for anxiety or sleep.     amiodarone 200 MG tablet  Commonly known as:  PACERONE  Take 1 tablet (200 mg total) by mouth 2 (two) times daily.     atorvastatin 40 MG tablet  Commonly known as:  LIPITOR  Take 1 tablet (40 mg total) by mouth daily.     digoxin 0.125 MG tablet  Commonly known as:  LANOXIN  Take 1 tablet (0.125 mg total) by mouth daily.     DULoxetine 30 MG capsule  Commonly known as:  CYMBALTA  Take 60 mg by mouth 2 (two) times daily.     GLUCAGON EMERGENCY 1 MG injection  Generic drug:  glucagon  Inject 1 mg as directed once. For hypotension     insulin aspart 100 UNIT/ML injection  Commonly known as:  novoLOG  Inject 30 Units into the skin 3 (three) times daily before meals.      LANTUS SOLOSTAR 100 UNIT/ML Solostar Pen  Generic drug:  Insulin Glargine  Inject 60 Units into the skin daily.     metoprolol succinate 100 MG 24 hr tablet  Commonly known as:  TOPROL-XL  100 mg in am and 50 mg in pm     multivitamin with minerals Tabs tablet  Take 1 tablet by mouth daily.     oxyCODONE-acetaminophen 10-325 MG per tablet  Commonly known as:  PERCOCET  Take 1-2 tablets by mouth every 8 (eight) hours. For pain     rivaroxaban 20 MG Tabs tablet  Commonly known as:  XARELTO  Take 20 mg by mouth daily with supper.     sacubitril-valsartan 49-51 MG  Commonly known as:  ENTRESTO  Take 1 tablet by mouth 2 (two) times daily.     senna 8.6 MG tablet  Commonly known as:  SENOKOT  Take 1 tablet by mouth 2 (two) times daily.     spironolactone 25 MG tablet  Commonly known as:  ALDACTONE  Take 1 tablet (25 mg total) by mouth daily.     torsemide 20 MG tablet  Commonly known as:  DEMADEX  Take 3 tablets (60 mg total) by mouth daily.     zolpidem 10 MG tablet  Commonly known as:  AMBIEN  Take 10 mg by mouth at bedtime. For sleep       Follow-up Information    Follow up with Loralie Champagne, MD.   Specialty:  Cardiology   Why:  you will be seen in one week.  they will call Monday and give your appt.      Contact information:   [address] [place] [id number] [phone]        Discharge Instructions: We added amiodarone to control your atrial fib and maybe it will convert.  Take twice a day.  Take your xarelto daily.  Your torsemide is now at 60 mg daily.   You will be seen in 1 week in the Heart Failure clinic.  They will call you tomorrow to arrange.  Weigh daily Call [phone] or [phone] if weight climbs more than 3 pounds in a day or 5 pounds in a week. No salt to very little salt in your diet.  No more than 2000 mg in a day. Call if increased shortness of breath or increased swelling.  Monitor your glucose closely, you may  need further adjustments with your insulin, if so notify Dr. Buddy Duty.  Any questions please call.   No torsemide today, begin tomorrow the 28th of December  Signed: Isaiah Serge Nurse Practitioner-Certified [facility]: [facility] 02/03/2014, 9:54 AM  Time spent on discharge : >35 minutes.

## 2014-02-03 NOTE — Progress Notes (Signed)
Patient ID: Larry [name], male   DOB: 1950-11-07, 63 y.o.   MRN: [id number]   SUBJECTIVE: Patient diuresed well yesterday, weight is down, creatinine stable.  He is breathing much better.  He is back in atrial fibrillation today, rate around 100 bpm.    Filed Vitals:   02/02/14 2033 02/03/14 0147 02/03/14 0630 02/03/14 0650  BP: 131/65 134/94 126/94 128/84  Pulse: 80 87 76   Temp: 98 F (36.7 C) 98 F (36.7 C) 98.3 F (36.8 C)   TempSrc: Oral Oral Oral   Resp: 20 20 20    Height:      Weight:   256 lb 6.3 oz (116.3 kg)   SpO2: 97% 97% 97%     Intake/Output Summary (Last 24 hours) at 02/03/14 9326 Last data filed at 02/03/14 0655  Gross per 24 hour  Intake    920 ml  Output   4600 ml  Net  -3680 ml    LABS: Basic Metabolic Panel:  Recent Labs  02/01/14 1537  02/02/14 0953 02/03/14 0355  NA  --   < > 136 139  K  --   < > 3.9 4.5  CL  --   < > 99 96  CO2  --   < > 30 34*  GLUCOSE  --   < > 174* 178*  BUN  --   < > 32* 35*  CREATININE  --   < > 1.67* 1.71*  CALCIUM  --   < > 8.8 9.4  MG 2.2  --   --   --   < > = values in this interval not displayed. Liver Function Tests:  Recent Labs  02/01/14 0758  AST 38*  ALT 44  ALKPHOS 88  BILITOT 0.8  PROT 8.3  ALBUMIN 3.9   No results for input(s): LIPASE, AMYLASE in the last 72 hours. CBC:  Recent Labs  02/01/14 0758 02/03/14 0355  WBC 8.2 6.2  NEUTROABS 4.9  --   HGB 14.6 13.8  HCT 46.0 44.1  MCV 100.9* 100.5*  PLT 249 245   Cardiac Enzymes:  Recent Labs  02/01/14 1342 02/01/14 1947 02/02/14 0133  TROPONINI 0.12* 0.14* 0.14*   BNP: Invalid input(s): POCBNP D-Dimer: No results for input(s): DDIMER in the last 72 hours. Hemoglobin A1C: No results for input(s): HGBA1C in the last 72 hours. Fasting Lipid Panel: No results for input(s): CHOL, HDL, LDLCALC, TRIG, CHOLHDL, LDLDIRECT in the last 72 hours. Thyroid Function Tests:  Recent Labs  02/01/14 1537  TSH 7.088*   Anemia Panel: No  results for input(s): VITAMINB12, FOLATE, FERRITIN, TIBC, IRON, RETICCTPCT in the last 72 hours.  RADIOLOGY: Dg Chest 2 View  02/01/2014   CLINICAL DATA:  Right ankle swelling, shortness of breath and chest pain  EXAM: CHEST  2 VIEW  COMPARISON:  05/19/2013  FINDINGS: Cardiac shadow is mildly enlarged. A pacing device is again seen and stable. Lungs are well aerated bilaterally without focal infiltrate or sizable effusion. No acute bony abnormality is seen.  IMPRESSION: No change from the prior exam.  No acute abnormality noted.   Electronically Signed   By: Inez Catalina M.D.   On: 02/01/2014 08:30   Ct Head Wo Contrast  02/01/2014   CLINICAL DATA:  Confusion.  EXAM: CT HEAD WITHOUT CONTRAST  TECHNIQUE: Contiguous axial images were obtained from the base of the skull through the vertex without intravenous contrast.  COMPARISON:  Head CT scan 05/19/2013.  FINDINGS: Remote posterior MCA  territory infarct on the right, chronic microvascular ischemic change and pituitary adenoma are again seen. There is no evidence of acute abnormality including infarct, hemorrhage, mass lesion, mass effect, midline shift or abnormal extra-axial fluid collection. The calvarium is intact. Imaged paranasal sinuses and mastoid air cells are clear.  IMPRESSION: No acute abnormality.  Pituitary adenoma, chronic microvascular ischemic change and remote right MCA territory infarct, unchanged.   Electronically Signed   By: Inge Rise M.D.   On: 02/01/2014 12:39    PHYSICAL EXAM General: NAD Neck: JVP 8 cm, no thyromegaly or thyroid nodule.  Lungs: Mildly decreased breath sounds at bases. CV: Nondisplaced PMI.  Heart regular S1/S2, no S3/S4, no murmur.  1+ edema 1/2 to knees bilaterally.   Abdomen: Soft, nontender, no hepatosplenomegaly, no distention.  Neurologic: Alert and oriented x 3.  Psych: Normal affect. Extremities: No clubbing or cyanosis.   TELEMETRY: Reviewed telemetry pt in NSR  ASSESSMENT AND PLAN: 1.  Acute on chronic systolic CHF: Nonischemic cardiomyopathy, known for years. Echo this admission with moderate to severe LV dilation, EF 20-25%.  Has St Jude ICD, narrow QRS so not CRT candidate. CPX VO2 low but slope ok. Interestingly, he had a Cardiolite done earlier this month with evidence for prior infarct but no ischemia (EF 23%).  Cardiac cath not done with CKD and no ischemia.  He was admitted on 12/25 with weight gain, dyspnea, and lower extremity swelling.  - He diuresed well yesterday, weight is down considerably and he feels better.  Still some volume but improved, he wants to go home.  Will give IV Lasix this morning then increase his home torsemide dose to 60 mg daily, starting tomorrow.  - Continue digoxin give low output on prior RHC. Digoxin level ok this admission. - Continue Toprol XL 100 qam/50 qpm and current spironolactone. - Continue Entresto 49/51 bid.  - We have discussed the fact that he may be nearing the need for advanced therapies. Weight will be an issue for transplant. Will see how he does with med adjustments but may need to move towards LVAD vs transplant in near future.  2. CKD: Creatinine stable, follow closely.  3. Atrial fibrillation: Paroxysmal. He is on Xarelto.  He is in atrial fibrillation with rate around 100 this morning (NSR at admission).  By prior ICD interrogations, he has occasional episodes of atrial fibrillation that tend to be brief.  I am going to start him on amiodarone 200 mg bid.  If he does not come out of atrial fibrillation on his own by followup in 1 week in the office, I will arrange DCCV.  He has been on Xarelto without missing doses.  4. DM2: Continue home regimen.  Control has not been ideal.  Needs continued work.  5. Depression: Seems to be ongoing issue.  6. OSA: Has not tolerated CPAP in the past.  Was supposed to have repeat sleep study 12/23 but rescheduled.  Overnight oximetry in the hospital did not show sats < 94% on room  air, so does not appear to be a role for nocturnal oxygen.  7. Elevated troponin: Mild elevation, no trend.  Suspect demand ischemia due to volume overload.  However, had large area of scar on recent Cardiolite.  Had been assumed to have nonischemic cardiomyopathy based on 2009 cath but I am concerned that he has significant CAD.  Given lack of ischemia on recent Cardiolite and no chest pain now, would not do angiography given elevated creatinine.  At some  point in the future when creatinine stable, would be helpful to assess coronaries.  8. Hypothyroidism: Mildly elevated TSH.  Will repeat TSH and free T4/free T3. Will need to follow closely with initiation of amiodarone, may need thyroid replacement.  9. Disposition: I will let him go home today.  He will continue prior home medications.  Only changes will be addition of amiodarone 200 mg bid and increase in home torsemide dose to 60 mg daily.  He will need to followup in CHF clinic in 1 week.   Loralie Champagne 02/03/2014 8:22 AM

## 2014-02-03 NOTE — Discharge Instructions (Signed)
We added amiodarone to control your atrial fib and maybe it will convert.  Take twice a day.  Take your xarelto daily.  Your torsemide is now at 60 mg daily.   You will be seen in 1 week in the Heart Failure clinic.  They will call you tomorrow to arrange.  Weigh daily Call [phone] or [phone] if weight climbs more than 3 pounds in a day or 5 pounds in a week. No salt to very little salt in your diet.  No more than 2000 mg in a day. Call if increased shortness of breath or increased swelling.  Monitor your glucose closely, you may need further adjustments with your insulin, if so notify Dr. Buddy Duty.  Any questions please call.   No torsemide today, begin tomorrow the 28th of December

## 2014-02-03 NOTE — Progress Notes (Signed)
The patient was on a continuous pulse ox overnight.  His low alarm was set at 91% and the machine never went off overnight.  His O2 sats were never below 94% when the RN checked on him while he was asleep.

## 2014-02-05 ENCOUNTER — Encounter: Payer: 59 | Attending: Internal Medicine | Admitting: *Deleted

## 2014-02-05 DIAGNOSIS — E119 Type 2 diabetes mellitus without complications: Secondary | ICD-10-CM | POA: Insufficient documentation

## 2014-02-05 DIAGNOSIS — Z4689 Encounter for fitting and adjustment of other specified devices: Secondary | ICD-10-CM | POA: Diagnosis not present

## 2014-02-05 DIAGNOSIS — E1121 Type 2 diabetes mellitus with diabetic nephropathy: Secondary | ICD-10-CM

## 2014-02-06 ENCOUNTER — Encounter (HOSPITAL_COMMUNITY): Payer: 59

## 2014-02-06 ENCOUNTER — Telehealth (HOSPITAL_COMMUNITY): Payer: Self-pay | Admitting: Vascular Surgery

## 2014-02-06 NOTE — Progress Notes (Signed)
UR completed - retro   Alexandre Lightsey K. Kaydin Karbowski, RN, BSN, [place], CCM  02/06/2014 5:38 PM

## 2014-02-06 NOTE — Telephone Encounter (Signed)
Pt wife called she wants to get a referral for husband to see a psychiatrist ASAP.[name] PLEASE ADVISE

## 2014-02-06 NOTE — Progress Notes (Signed)
Professional CGM Set Up Time arrived:1400  Time left: 1500  Patient here for placement of Dexcom Continuous Glucose Monitor  Explained to patient purpose of wearing the Dexcom per MD orders. They expressed verbal understanding.  Sensor inserted into skin at least 2 inches from any insulin injection sites. Patient instructed to check BG at least 2 times each day @ every 12 hours Explained to patient how to mark food intake, exercise and any diabetes medications on Dexcom Receiver.  Patient instructed to return on Tuesday, January 5th for removal of CGM Dexcom transmitter attached to sensor and taped down.  Patient informed that when Dexcom and sensor are connected, the system is waterproof and that they are to wear it consistently until they return to this office.  Patient to call this office if any questions or concerns prior to appointment to return the [facility] for downloading.  Patient had questions about insulin pump therapy so I provided Intro to Pumping information and demonstrated several pumps and infusion sets. He plans to discuss further with his MD before making a decision on moving towards pump therapy.

## 2014-02-07 ENCOUNTER — Ambulatory Visit (HOSPITAL_COMMUNITY)
Admission: RE | Admit: 2014-02-07 | Discharge: 2014-02-07 | Disposition: A | Discharge status: Home / Self Care | Payer: 59 | Source: Ambulatory Visit | Attending: Internal Medicine | Admitting: Internal Medicine

## 2014-02-07 ENCOUNTER — Telehealth (HOSPITAL_COMMUNITY): Payer: Self-pay | Admitting: Cardiology

## 2014-02-07 ENCOUNTER — Encounter (HOSPITAL_COMMUNITY): Payer: Self-pay

## 2014-02-07 VITALS — BP 88/58 | HR 76 | Wt 259.4 lb

## 2014-02-07 DIAGNOSIS — I5022 Chronic systolic (congestive) heart failure: Secondary | ICD-10-CM | POA: Diagnosis not present

## 2014-02-07 DIAGNOSIS — F419 Anxiety disorder, unspecified: Secondary | ICD-10-CM | POA: Diagnosis not present

## 2014-02-07 DIAGNOSIS — Z794 Long term (current) use of insulin: Secondary | ICD-10-CM | POA: Diagnosis not present

## 2014-02-07 DIAGNOSIS — G4733 Obstructive sleep apnea (adult) (pediatric): Secondary | ICD-10-CM | POA: Diagnosis not present

## 2014-02-07 DIAGNOSIS — F329 Major depressive disorder, single episode, unspecified: Secondary | ICD-10-CM | POA: Diagnosis not present

## 2014-02-07 DIAGNOSIS — I429 Cardiomyopathy, unspecified: Secondary | ICD-10-CM | POA: Insufficient documentation

## 2014-02-07 DIAGNOSIS — E119 Type 2 diabetes mellitus without complications: Secondary | ICD-10-CM | POA: Insufficient documentation

## 2014-02-07 DIAGNOSIS — I5023 Acute on chronic systolic (congestive) heart failure: Secondary | ICD-10-CM

## 2014-02-07 DIAGNOSIS — E669 Obesity, unspecified: Secondary | ICD-10-CM | POA: Diagnosis not present

## 2014-02-07 DIAGNOSIS — E039 Hypothyroidism, unspecified: Secondary | ICD-10-CM | POA: Diagnosis not present

## 2014-02-07 DIAGNOSIS — Z7901 Long term (current) use of anticoagulants: Secondary | ICD-10-CM | POA: Insufficient documentation

## 2014-02-07 DIAGNOSIS — N189 Chronic kidney disease, unspecified: Secondary | ICD-10-CM | POA: Insufficient documentation

## 2014-02-07 DIAGNOSIS — F4310 Post-traumatic stress disorder, unspecified: Secondary | ICD-10-CM | POA: Diagnosis not present

## 2014-02-07 DIAGNOSIS — Z87891 Personal history of nicotine dependence: Secondary | ICD-10-CM | POA: Diagnosis not present

## 2014-02-07 DIAGNOSIS — Z8673 Personal history of transient ischemic attack (TIA), and cerebral infarction without residual deficits: Secondary | ICD-10-CM | POA: Insufficient documentation

## 2014-02-07 DIAGNOSIS — Z79899 Other long term (current) drug therapy: Secondary | ICD-10-CM | POA: Insufficient documentation

## 2014-02-07 DIAGNOSIS — I480 Paroxysmal atrial fibrillation: Secondary | ICD-10-CM | POA: Diagnosis not present

## 2014-02-07 DIAGNOSIS — E785 Hyperlipidemia, unspecified: Secondary | ICD-10-CM | POA: Diagnosis not present

## 2014-02-07 LAB — BASIC METABOLIC PANEL
Anion gap: 9 (ref 5–15)
BUN: 44 mg/dL — AB (ref 6–23)
CALCIUM: 8.6 mg/dL (ref 8.4–10.5)
CO2: 27 mmol/L (ref 19–32)
Chloride: 98 mEq/L (ref 96–112)
Creatinine, Ser: 2.13 mg/dL — ABNORMAL HIGH (ref 0.50–1.35)
GFR calc Af Amer: 37 mL/min — ABNORMAL LOW (ref >90)
GFR, EST NON AFRICAN AMERICAN: 32 mL/min — AB (ref >90)
Glucose, Bld: 143 mg/dL — ABNORMAL HIGH (ref 70–99)
POTASSIUM: 4.7 mmol/L (ref 3.5–5.1)
Sodium: 134 mmol/L — ABNORMAL LOW (ref 135–145)

## 2014-02-07 MED ORDER — SACUBITRIL-VALSARTAN 24-26 MG PO TABS: 1.0000 | ORAL_TABLET | Freq: Two times a day (BID) | ORAL | Status: DC

## 2014-02-07 NOTE — Telephone Encounter (Signed)
Patient being seen in clinic today, will defer to Dr. Aundra Dubin. Renee Pain

## 2014-02-07 NOTE — Telephone Encounter (Signed)
Pt scheduled for DCCV Cpt 92960 With pts current insurance- [employer] No pre cert required

## 2014-02-07 NOTE — Patient Instructions (Addendum)
We have set you up for a cardioversion next Thursday 02/14/2014 at 1:00 pm. Check into main entrance A of hospital at 12 noon. Nothing to eat or drink after midnight day of procedure. HOLD INSULIN AND TORSEMIDE DAY OF PROCEDURE. TAKE XARELTO MORNING OF PROCEDURE.  Follow up in 2 weeks.  Do the following things EVERYDAY: 1) Weigh yourself in the morning before breakfast. Write it down and keep it in a log. 2) Take your medicines as prescribed 3) Eat low salt foods-Limit salt (sodium) to 2000 mg per day.  4) Stay as active as you can everyday 5) Limit all fluids for the day to less than 2 liters

## 2014-02-08 ENCOUNTER — Encounter (HOSPITAL_COMMUNITY): Payer: 59

## 2014-02-09 NOTE — Progress Notes (Signed)
Patient ID: DAEMON DOWTY, male   DOB: Sep 16, 1950, 64 y.o.   MRN: [id number] PCP: Dr. Rex Kras Referring MD: Dr. Irish Lack Endocrinologist: Dr Buddy Duty  64 yo with history of chronic systolic CHF (nonischemic cardiomyopathy), CKD, prior CVA, and paroxysmal atrial fibrillation.  Patient says that he has been known to have a low EF for years.  He has a Research officer, political party ICD.  He had LHC in 2009 with nonobstructive disease. Last echo in 3/15 showed moderate LV dilation with EF 25-30%.    Patient had [place] 8/15 showing low cardiac output, mildly elevated PCWP and primarily pulmonary venous hypertension.  He was transitioned from Lasix to torsemide and started on digoxin.  At a prior appointment, [name] was started and valsartan stopped.  He is currently taking only 20 mg torsemide, we had thought he was on 40 mg daily. Weight is up about 12 lbs.    After last appointment, adenosine Cardiolite was done showing large area of apical scar but no ischemia.  This raised concern for possible ischemic cardiomyopathy.  He was admitted later in 12/15 with acute on chronic systolic CHF.  He also was noted to have gone into atrial fibrillation during this hospitalization, amiodarone was started in preparation for possible DCCV.  Troponin was mildly elevated in the hospital in the absence of chest pain, likely demand ischemia.  He was diuresed with IV Lasix and was discharged soon after.   Today, weight is down 5 lbs compared to last appointment.  He is short of breath walking up a flight of steps but can get to the top. Dyspnea after walking about 500 feet. BP low today but no lightheadedness, BP usually higher.  Wife says he is very anxious and is unable to sleep.  She wants him to be referred to a psychiatrist.    Labs (2/15): LDL 103 Labs (3/15): K 4.1, creatinine 1.5, BNP 164 Labs (4/15): K 3.5, creatinine 3.3 => 1.9 => 2.1, BNP 96 Labs (4/15): K 4.3, creatinine 1.43, glucose 308 Labs (5/15): K 5.1 Creatinine 1.35  Labs  (8/15): K 5 => 4.7 => 3.8, creatinine 1.19 => 1.57 => 1.6, digoxin 1.1 Labs (9/15): K 4.6, creatinine 1.32 => 1.9 => 1.1, digoxin 0.6 => 1.2 (not trough) Labs (10/15): K 4.5, creatinine 1.36, pro-BNP 1261 Labs (12/15): TSH 8.6 (elevated), free T3/free T4 normal, K 4.5, creatinine 1.71, digoxin 0.6, HCT 44.1, AST 38, ALT 44  ECG: atrial fibrillation, v-paced  PMH: 1. Chronic systolic CHF: ? Nonischemic cardiomyopathy.  Most recent echo in 3/15 with EF 25-30%, moderate LV dilation, mild LVH, moderate diastolic dysfunction, IVC normal, mildly decreased RV systolic function.  Prior echo in 2011 also with EF 25-30%.  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  [employer] Jude ICD. CPX 05/2013: FVC 3.89 (82%), FEV1 2.99 (83%), FEV1/FVC 77%, Peak HR 118 (74% predicted max), Peak VO2 12.4 (51% of predicted; when corrected to IBW =17.7), VE/VCO2 29.9, OUES 1.39, Peak RER 1.21.  RHC (8/15) with mean RA 6, PA 60/21 mean 36, mean PCWP 21, CI 1.81, PVR 3.6 WU.  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   2. Type II diabetes 3. CKD 4. Monoclonal gammopathy of uncertain etiology.  5. CVA 2007 6. Degenerative disc disease.  7. Pituitary microadenoma: Nonfunctioning, s/p surgery in 2009.  8. OSA: Unable to tolerate Bipap/CPAP. 9. Depression 10. Hypothyroidism 11. Hyperlipidemia 12. Atrial fibrillation: Paroxysmal.  Patient is on Xarelto.  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   FH: Father with MI x 2 and CHF, strong history of CAD on his father's side.   ROS: All systems reviewed and negative except as per HPI.   Current Outpatient Prescriptions  Medication Sig Dispense Refill  . ALPRAZolam (XANAX) 0.5 MG tablet Take 0.5 mg by mouth 3 (three) times daily as needed for anxiety or sleep.    [name] amiodarone (PACERONE) 200 MG tablet Take 1 tablet (200 mg total) by mouth 2 (two) times  daily. 60 tablet 6  . atorvastatin (LIPITOR) 40 MG tablet Take 1 tablet (40 mg total) by mouth daily. 90 tablet 1  . digoxin (LANOXIN) 0.125 MG tablet Take 1 tablet (0.125 mg total) by mouth daily. 90 tablet 1  . DULoxetine (CYMBALTA) 30 MG capsule Take 60 mg by mouth 2 (two) times daily.     [name] GLUCAGON EMERGENCY 1 MG injection Inject 1 mg as directed once. For hypotension    . insulin aspart (NOVOLOG) 100 UNIT/ML injection Inject 30 Units into the skin 3 (three) times daily before meals.     [name] LANTUS SOLOSTAR 100 UNIT/ML Solostar Pen Inject 60 Units into the skin daily.    . metoprolol succinate (TOPROL-XL) 100 MG 24 hr tablet 100 mg in am and 50 mg in pm    . Multiple Vitamin (MULITIVITAMIN WITH MINERALS) TABS Take 1 tablet by mouth daily.    [name] oxyCODONE-acetaminophen (PERCOCET) 10-325 MG per tablet Take 1-2 tablets by mouth every 8 (eight) hours. For pain    . rivaroxaban (XARELTO) 20 MG TABS tablet Take 20 mg by mouth daily with supper.    . senna (SENOKOT) 8.6 MG tablet Take 1 tablet by mouth 2 (two) times daily.    [name] spironolactone (ALDACTONE) 25 MG tablet Take 1 tablet (25 mg total) by mouth daily. 30 tablet 6  . torsemide (DEMADEX) 20 MG tablet Take 3 tablets (60 mg total) by mouth daily. 90 tablet 6  . sacubitril-valsartan (ENTRESTO) 24-26 MG Take 1 tablet by mouth 2 (two) times daily. 60 tablet 6   No current facility-administered medications for this encounter.    Filed Vitals:   02/07/14 1503  BP: 88/58  Pulse: 76  Weight: 259 lb 6.4 oz (117.663 kg)  SpO2: 95%   General: NAD Wife present  Neck: Thick, JVP 8 cm. No thyromegaly or thyroid nodule.  Lungs: Clear to auscultation bilaterally with normal respiratory effort. CV: Nondisplaced PMI.  Heart regular S1/S2, no S3/S4, no murmur.  1+ ankle edema bilaterally.  No carotid bruit.  Normal pedal pulses.  Abdomen: Obese, Soft, tender to palpation, obese, no hepatosplenomegaly, non-distended Skin: Intact without lesions or  rashes.  Neurologic: Alert and oriented x 3.  Psych: Flat affect. Extremities: No clubbing or cyanosis.   Assessment/plan: 1. Chronic systolic CHF: Cardiomyopathy x years.  Thought to be nonischemic but Cardiolite in 12/15 showed apical scar (no ischemia).  EF has been stable at 25-30% (04/2013). Has [employer] Jude ICD, narrow QRS so not CRT candidate. CPX VO2 low but slope ok.  NYHA class III symptoms.  Weight is down since recent admission with diuresis.  - Continue current torsemide, 60 mg daily.  - Continue digoxin give low output on RHC.  Last digoxin level ok. - Continue Toprol XL 100 qam/50 qpm and current spironolactone. - Continue Entresto 49/51 bid and spironolactone.  - We have discussed the fact that he may be nearing the need for advanced therapies.  Weight will be an issue for transplant.  He may need LVAD in the near future.  - BMET today.   - Reinforced the need and importance of daily weights, a low sodium diet, and fluid restriction (less than 2 L a day). Instructed to call the HF clinic if weight increases more than 3 lbs overnight or 5 lbs in a week.  2. CKD: Last creatinine 1.7, repeat today.  If GFR < 50, may need to decrease Xarelto to 15 mg daily.   3. Atrial fibrillation: Paroxysmal.  Continue Xarelto. He remains in atrial fibrillation today.  He has not missed any doses.  I will arrange for DCCV next week.   Continue amiodarone to maintain NSR after DCCV.  T3 and T4 ok recently, mild elevation in ALT.  Will need to follow closely.  4. Obesity:  Needs to lose weight.  Asked him to cut back on his portions and try low carb diet.   5. DM2: Continue to follow with endocrinology.  Glucose control still not ideal.  6. Depression/anxiety: Seems to be ongoing issue.  He is very anxious and unable to sleep.  I agree that he needs evaluation by psychiatry.  I will try to arrange.  7. Fatigue: In addition to CHF, I think that OSA (that is untreated as he has had a hard time with CPAP) and  depression play a large role. He has an appointment for a repeat sleep study but not until March.   I will try to arrange sooner.   8. CAD: Though to have nonischemic CMP, but Cardiolite showed large apical scar.  Given no ischemia or chest pain, would not do cardiac cath at this time given CKD.  Continue statin.  Not on ASA given Xarelto use.   Followup in 2 wks  Loralie Champagne 02/09/2014

## 2014-02-11 ENCOUNTER — Telehealth (HOSPITAL_COMMUNITY): Payer: Self-pay

## 2014-02-11 ENCOUNTER — Other Ambulatory Visit (HOSPITAL_COMMUNITY): Payer: Self-pay

## 2014-02-11 ENCOUNTER — Telehealth (HOSPITAL_COMMUNITY): Payer: Self-pay | Admitting: Vascular Surgery

## 2014-02-11 MED ORDER — RIVAROXABAN 15 MG PO TABS: 15.0000 mg | ORAL_TABLET | Freq: Every day | ORAL | Status: DC

## 2014-02-11 NOTE — Telephone Encounter (Signed)
Spoke w/pt's wife she states pt is just not sleeping, he sleeps about 4 hours then is awake, she states they stopped the Ambien as Dr Aundra Dubin told them to, his sleep is still the same with or without the Ambien, she wants to try and give him Melatonin but wanted to make sure it was ok with other meds, advised ok to try, pt is also sch to see psych on Fri

## 2014-02-11 NOTE — Telephone Encounter (Signed)
Pt wife left message .[name] Pt is not sleeping she wanted to know if he can get something to help him sleep, pt is very weak he cis struggling with being extremely cold and hot... Please advise

## 2014-02-11 NOTE — Telephone Encounter (Signed)
Per Dr. Aundra Dubin, advised to decrease Xarelto to 15mg  once daily.  Rx updated to preferred pharmacy electronically.  Patient aware and agreeable.  Renee Pain

## 2014-02-13 ENCOUNTER — Encounter (HOSPITAL_COMMUNITY): Payer: 59

## 2014-02-13 MED ORDER — SODIUM CHLORIDE 0.9 % IV SOLN
INTRAVENOUS | Status: DC
  Administered 2014-02-14: 14:00:00 via INTRAVENOUS

## 2014-02-14 ENCOUNTER — Ambulatory Visit (HOSPITAL_COMMUNITY): Payer: 59 | Admitting: Anesthesiology

## 2014-02-14 ENCOUNTER — Ambulatory Visit (HOSPITAL_COMMUNITY)
Admission: RE | Admit: 2014-02-14 | Discharge: 2014-02-14 | Disposition: A | Discharge status: Home / Self Care | Payer: 59 | Source: Ambulatory Visit | Attending: Cardiology | Admitting: Cardiology

## 2014-02-14 ENCOUNTER — Encounter (HOSPITAL_COMMUNITY)
Admission: RE | Disposition: A | Discharge status: Home / Self Care | Payer: Self-pay | Source: Ambulatory Visit | Attending: Cardiology

## 2014-02-14 ENCOUNTER — Encounter (HOSPITAL_COMMUNITY): Payer: Self-pay | Admitting: *Deleted

## 2014-02-14 DIAGNOSIS — F4310 Post-traumatic stress disorder, unspecified: Secondary | ICD-10-CM | POA: Diagnosis not present

## 2014-02-14 DIAGNOSIS — Z87891 Personal history of nicotine dependence: Secondary | ICD-10-CM | POA: Diagnosis not present

## 2014-02-14 DIAGNOSIS — I4891 Unspecified atrial fibrillation: Secondary | ICD-10-CM

## 2014-02-14 DIAGNOSIS — E669 Obesity, unspecified: Secondary | ICD-10-CM | POA: Diagnosis not present

## 2014-02-14 DIAGNOSIS — Z8673 Personal history of transient ischemic attack (TIA), and cerebral infarction without residual deficits: Secondary | ICD-10-CM | POA: Diagnosis not present

## 2014-02-14 DIAGNOSIS — I2510 Atherosclerotic heart disease of native coronary artery without angina pectoris: Secondary | ICD-10-CM | POA: Insufficient documentation

## 2014-02-14 DIAGNOSIS — N289 Disorder of kidney and ureter, unspecified: Secondary | ICD-10-CM | POA: Insufficient documentation

## 2014-02-14 DIAGNOSIS — E039 Hypothyroidism, unspecified: Secondary | ICD-10-CM | POA: Diagnosis not present

## 2014-02-14 DIAGNOSIS — Z6838 Body mass index (BMI) 38.0-38.9, adult: Secondary | ICD-10-CM | POA: Insufficient documentation

## 2014-02-14 DIAGNOSIS — E785 Hyperlipidemia, unspecified: Secondary | ICD-10-CM | POA: Diagnosis not present

## 2014-02-14 DIAGNOSIS — E119 Type 2 diabetes mellitus without complications: Secondary | ICD-10-CM | POA: Insufficient documentation

## 2014-02-14 DIAGNOSIS — I429 Cardiomyopathy, unspecified: Secondary | ICD-10-CM | POA: Diagnosis not present

## 2014-02-14 DIAGNOSIS — G4733 Obstructive sleep apnea (adult) (pediatric): Secondary | ICD-10-CM | POA: Insufficient documentation

## 2014-02-14 DIAGNOSIS — Z794 Long term (current) use of insulin: Secondary | ICD-10-CM | POA: Diagnosis not present

## 2014-02-14 DIAGNOSIS — F418 Other specified anxiety disorders: Secondary | ICD-10-CM | POA: Diagnosis not present

## 2014-02-14 DIAGNOSIS — I509 Heart failure, unspecified: Secondary | ICD-10-CM | POA: Insufficient documentation

## 2014-02-14 HISTORY — DX: Peripheral vascular disease, unspecified: I73.9

## 2014-02-14 HISTORY — PX: CARDIOVERSION: SHX1299

## 2014-02-14 LAB — POCT I-STAT 4, (NA,K, GLUC, HGB,HCT)
GLUCOSE: 191 mg/dL — AB (ref 70–99)
HCT: 47 % (ref 39.0–52.0)
Hemoglobin: 16 g/dL (ref 13.0–17.0)
POTASSIUM: 5.3 mmol/L — AB (ref 3.5–5.1)
SODIUM: 137 mmol/L (ref 135–145)

## 2014-02-14 LAB — COMPREHENSIVE METABOLIC PANEL
ALT: 53 U/L (ref 0–53)
AST: 37 U/L (ref 0–37)
Albumin: 3.4 g/dL — ABNORMAL LOW (ref 3.5–5.2)
Alkaline Phosphatase: 81 U/L (ref 39–117)
Anion gap: 8 (ref 5–15)
BILIRUBIN TOTAL: 1 mg/dL (ref 0.3–1.2)
BUN: 37 mg/dL — AB (ref 6–23)
CO2: 26 mmol/L (ref 19–32)
CREATININE: 1.96 mg/dL — AB (ref 0.50–1.35)
Calcium: 8.7 mg/dL (ref 8.4–10.5)
Chloride: 99 mEq/L (ref 96–112)
GFR calc non Af Amer: 35 mL/min — ABNORMAL LOW (ref >90)
GFR, EST AFRICAN AMERICAN: 41 mL/min — AB (ref >90)
Glucose, Bld: 186 mg/dL — ABNORMAL HIGH (ref 70–99)
Potassium: 5.1 mmol/L (ref 3.5–5.1)
Sodium: 133 mmol/L — ABNORMAL LOW (ref 135–145)
TOTAL PROTEIN: 6.8 g/dL (ref 6.0–8.3)

## 2014-02-14 LAB — MAGNESIUM: MAGNESIUM: 2.5 mg/dL (ref 1.5–2.5)

## 2014-02-14 SURGERY — CARDIOVERSION
Anesthesia: Monitor Anesthesia Care

## 2014-02-14 MED ORDER — PROPOFOL 10 MG/ML IV BOLUS
INTRAVENOUS | Status: DC | PRN
Start: 2014-02-14 — End: 2014-02-14
  Administered 2014-02-14: 70 mg via INTRAVENOUS

## 2014-02-14 MED ORDER — FENTANYL CITRATE 0.05 MG/ML IJ SOLN: 25.0000 ug | INTRAMUSCULAR | Status: DC | PRN

## 2014-02-14 MED ORDER — MEPERIDINE HCL 100 MG/ML IJ SOLN: 6.2500 mg | INTRAMUSCULAR | Status: DC | PRN

## 2014-02-14 MED ORDER — LIDOCAINE HCL (CARDIAC) 20 MG/ML IV SOLN
INTRAVENOUS | Status: DC | PRN
  Administered 2014-02-14: 40 mg via INTRAVENOUS

## 2014-02-14 MED ORDER — PROMETHAZINE HCL 25 MG/ML IJ SOLN: 6.2500 mg | INTRAMUSCULAR | Status: DC | PRN

## 2014-02-14 NOTE — Transfer of Care (Signed)
Immediate Anesthesia Transfer of Care Note  Patient: Larry [name]  Procedure(s) Performed: Procedure(s): CARDIOVERSION (N/A)  Patient Location: Endoscopy Unit  Anesthesia Type:MAC  Level of Consciousness: awake, alert  and oriented  Airway & Oxygen Therapy: Patient Spontanous Breathing and Patient connected to nasal cannula oxygen  Post-op Assessment: Report given to PACU RN and Post -op Vital signs reviewed and stable  Post vital signs: Reviewed and stable  Complications: No apparent anesthesia complications

## 2014-02-14 NOTE — H&P (View-Only) (Signed)
Patient ID: Larry [name], male   DOB: [birth year], 64 y.o.   MRN: [id number] PCP: Dr. Rex Kras Referring MD: Dr. Irish Lack Endocrinologist: Dr Buddy Duty  64 yo with history of chronic systolic CHF (nonischemic cardiomyopathy), CKD, prior CVA, and paroxysmal atrial fibrillation.  Patient says that he has been known to have a low EF for years.  He has a Research officer, political party ICD.  He had LHC in 2009 with nonobstructive disease. Last echo in 3/15 showed moderate LV dilation with EF 25-30%.    Patient had [place] 8/15 showing low cardiac output, mildly elevated PCWP and primarily pulmonary venous hypertension.  He was transitioned from Lasix to torsemide and started on digoxin.  At a prior appointment, [name] was started and valsartan stopped.  He is currently taking only 20 mg torsemide, we had thought he was on 40 mg daily. Weight is up about 12 lbs.    After last appointment, adenosine Cardiolite was done showing large area of apical scar but no ischemia.  This raised concern for possible ischemic cardiomyopathy.  He was admitted later in 12/15 with acute on chronic systolic CHF.  He also was noted to have gone into atrial fibrillation during this hospitalization, amiodarone was started in preparation for possible DCCV.  Troponin was mildly elevated in the hospital in the absence of chest pain, likely demand ischemia.  He was diuresed with IV Lasix and was discharged soon after.   Today, weight is down 5 lbs compared to last appointment.  He is short of breath walking up a flight of steps but can get to the top. Dyspnea after walking about 500 feet. BP low today but no lightheadedness, BP usually higher.  Wife says he is very anxious and is unable to sleep.  She wants him to be referred to a psychiatrist.    Labs (2/15): LDL 103 Labs (3/15): K 4.1, creatinine 1.5, BNP 164 Labs (4/15): K 3.5, creatinine 3.3 => 1.9 => 2.1, BNP 96 Labs (4/15): K 4.3, creatinine 1.43, glucose 308 Labs (5/15): K 5.1 Creatinine 1.35  Labs  (8/15): K 5 => 4.7 => 3.8, creatinine 1.19 => 1.57 => 1.6, digoxin 1.1 Labs (9/15): K 4.6, creatinine 1.32 => 1.9 => 1.1, digoxin 0.6 => 1.2 (not trough) Labs (10/15): K 4.5, creatinine 1.36, pro-BNP 1261 Labs (12/15): TSH 8.6 (elevated), free T3/free T4 normal, K 4.5, creatinine 1.71, digoxin 0.6, HCT 44.1, AST 38, ALT 44  ECG: atrial fibrillation, v-paced  PMH: 1. Chronic systolic CHF: ? Nonischemic cardiomyopathy.  Most recent echo in 3/15 with EF 25-30%, moderate LV dilation, mild LVH, moderate diastolic dysfunction, IVC normal, mildly decreased RV systolic function.  Prior echo in 2011 also with EF 25-30%.  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  [employer] Jude ICD. CPX 05/2013: FVC 3.89 (82%), FEV1 2.99 (83%), FEV1/FVC 77%, Peak HR 118 (74% predicted max), Peak VO2 12.4 (51% of predicted; when corrected to IBW =17.7), VE/VCO2 29.9, OUES 1.39, Peak RER 1.21.  RHC (8/15) with mean RA 6, PA 60/21 mean 36, mean PCWP 21, CI 1.81, PVR 3.6 WU.  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   2. Type II diabetes 3. CKD 4. Monoclonal gammopathy of uncertain etiology.  5. CVA 2007 6. Degenerative disc disease.  7. Pituitary microadenoma: Nonfunctioning, s/p surgery in 2009.  8. OSA: Unable to tolerate Bipap/CPAP. 9. Depression 10. Hypothyroidism 11. Hyperlipidemia 12. Atrial fibrillation: Paroxysmal.  Patient is on Xarelto.  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   FH: Father with MI x 2 and CHF, strong history of CAD on his father's side.   ROS: All systems reviewed and negative except as per HPI.   Current Outpatient Prescriptions  Medication Sig Dispense Refill  . ALPRAZolam (XANAX) 0.5 MG tablet Take 0.5 mg by mouth 3 (three) times daily as needed for anxiety or sleep.    [name] amiodarone (PACERONE) 200 MG tablet Take 1 tablet (200 mg total) by mouth 2 (two) times  daily. 60 tablet 6  . atorvastatin (LIPITOR) 40 MG tablet Take 1 tablet (40 mg total) by mouth daily. 90 tablet 1  . digoxin (LANOXIN) 0.125 MG tablet Take 1 tablet (0.125 mg total) by mouth daily. 90 tablet 1  . DULoxetine (CYMBALTA) 30 MG capsule Take 60 mg by mouth 2 (two) times daily.     [name] GLUCAGON EMERGENCY 1 MG injection Inject 1 mg as directed once. For hypotension    . insulin aspart (NOVOLOG) 100 UNIT/ML injection Inject 30 Units into the skin 3 (three) times daily before meals.     [name] LANTUS SOLOSTAR 100 UNIT/ML Solostar Pen Inject 60 Units into the skin daily.    . metoprolol succinate (TOPROL-XL) 100 MG 24 hr tablet 100 mg in am and 50 mg in pm    . Multiple Vitamin (MULITIVITAMIN WITH MINERALS) TABS Take 1 tablet by mouth daily.    [name] oxyCODONE-acetaminophen (PERCOCET) 10-325 MG per tablet Take 1-2 tablets by mouth every 8 (eight) hours. For pain    . rivaroxaban (XARELTO) 20 MG TABS tablet Take 20 mg by mouth daily with supper.    . senna (SENOKOT) 8.6 MG tablet Take 1 tablet by mouth 2 (two) times daily.    [name] spironolactone (ALDACTONE) 25 MG tablet Take 1 tablet (25 mg total) by mouth daily. 30 tablet 6  . torsemide (DEMADEX) 20 MG tablet Take 3 tablets (60 mg total) by mouth daily. 90 tablet 6  . sacubitril-valsartan (ENTRESTO) 24-26 MG Take 1 tablet by mouth 2 (two) times daily. 60 tablet 6   No current facility-administered medications for this encounter.    Filed Vitals:   02/07/14 1503  BP: 88/58  Pulse: 76  Weight: 259 lb 6.4 oz (117.663 kg)  SpO2: 95%   General: NAD Wife present  Neck: Thick, JVP 8 cm. No thyromegaly or thyroid nodule.  Lungs: Clear to auscultation bilaterally with normal respiratory effort. CV: Nondisplaced PMI.  Heart regular S1/S2, no S3/S4, no murmur.  1+ ankle edema bilaterally.  No carotid bruit.  Normal pedal pulses.  Abdomen: Obese, Soft, tender to palpation, obese, no hepatosplenomegaly, non-distended Skin: Intact without lesions or  rashes.  Neurologic: Alert and oriented x 3.  Psych: Flat affect. Extremities: No clubbing or cyanosis.   Assessment/plan: 1. Chronic systolic CHF: Cardiomyopathy x years.  Thought to be nonischemic but Cardiolite in 12/15 showed apical scar (no ischemia).  EF has been stable at 25-30% (04/2013). Has [employer] Jude ICD, narrow QRS so not CRT candidate. CPX VO2 low but slope ok.  NYHA class III symptoms.  Weight is down since recent admission with diuresis.  - Continue current torsemide, 60 mg daily.  - Continue digoxin give low output on RHC.  Last digoxin level ok. - Continue Toprol XL 100 qam/50 qpm and current spironolactone. - Continue Entresto 49/51 bid and spironolactone.  - We have discussed the fact that he may be nearing the need for advanced therapies.  Weight will be an issue for transplant.  He may need LVAD in the near future.  - BMET today.   - Reinforced the need and importance of daily weights, a low sodium diet, and fluid restriction (less than 2 L a day). Instructed to call the HF clinic if weight increases more than 3 lbs overnight or 5 lbs in a week.  2. CKD: Last creatinine 1.7, repeat today.  If GFR < 50, may need to decrease Xarelto to 15 mg daily.   3. Atrial fibrillation: Paroxysmal.  Continue Xarelto. He remains in atrial fibrillation today.  He has not missed any doses.  I will arrange for DCCV next week.   Continue amiodarone to maintain NSR after DCCV.  T3 and T4 ok recently, mild elevation in ALT.  Will need to follow closely.  4. Obesity:  Needs to lose weight.  Asked him to cut back on his portions and try low carb diet.   5. DM2: Continue to follow with endocrinology.  Glucose control still not ideal.  6. Depression/anxiety: Seems to be ongoing issue.  He is very anxious and unable to sleep.  I agree that he needs evaluation by psychiatry.  I will try to arrange.  7. Fatigue: In addition to CHF, I think that OSA (that is untreated as he has had a hard time with CPAP) and  depression play a large role. He has an appointment for a repeat sleep study but not until March.   I will try to arrange sooner.   8. CAD: Though to have nonischemic CMP, but Cardiolite showed large apical scar.  Given no ischemia or chest pain, would not do cardiac cath at this time given CKD.  Continue statin.  Not on ASA given Xarelto use.   Followup in 2 wks  Loralie Champagne 02/09/2014

## 2014-02-14 NOTE — Anesthesia Postprocedure Evaluation (Signed)
  Anesthesia Post-op Note  Patient: Larry [name]  Procedure(s) Performed: Procedure(s): CARDIOVERSION (N/A)  Patient Location: PACU  Anesthesia Type:MAC  Level of Consciousness: awake  Airway and Oxygen Therapy: Patient Spontanous Breathing  Post-op Pain: none  Post-op Assessment: Post-op Vital signs reviewed  Post-op Vital Signs: Reviewed and stable  Last Vitals:  Filed Vitals:   02/14/14 1335  BP: 037/54  Resp:     Complications: No apparent anesthesia complications

## 2014-02-14 NOTE — Interval H&P Note (Signed)
History and Physical Interval Note:  02/14/2014 1:13 PM  Larry [name]  has presented today for surgery, with the diagnosis of a fib  The various methods of treatment have been discussed with the patient and family. After consideration of risks, benefits and other options for treatment, the patient has consented to  Procedure(s): CARDIOVERSION (N/A) as a surgical intervention .  The patient's history has been reviewed, patient examined, no change in status, stable for surgery.  I have reviewed the patient's chart and labs.  Questions were answered to the patient's satisfaction.     Tejah Brekke [employer]

## 2014-02-14 NOTE — Discharge Instructions (Signed)
Conscious Sedation Sedation is the use of medicines to promote relaxation and relieve discomfort and anxiety. Conscious sedation is a type of sedation. Under conscious sedation you are less alert than normal but are still able to respond to instructions or stimulation. Conscious sedation is used during short medical and dental procedures. It is milder than deep sedation or general anesthesia and allows you to return to your regular activities sooner.  LET [facility] CARE PROVIDER KNOW ABOUT:   Any allergies you have.  All medicines you are taking, including vitamins, herbs, eye drops, creams, and over-the-counter medicines.  Use of steroids (by mouth or creams).  Previous problems you or members of your family have had with the use of anesthetics.  Any blood disorders you have.  Previous surgeries you have had.  Medical conditions you have.  Possibility of pregnancy, if this applies.  Use of cigarettes, alcohol, or illegal drugs. RISKS AND COMPLICATIONS Generally, this is a safe procedure. However, as with any procedure, problems can occur. Possible problems include:  Oversedation.  Trouble breathing on your own. You may need to have a breathing tube until you are awake and breathing on your own.  Allergic reaction to any of the medicines used for the procedure. BEFORE THE PROCEDURE  You may have blood tests done. These tests can help show how well your kidneys and liver are working. They can also show how well your blood clots.  A physical exam will be done.  Only take medicines as directed by your health care provider. You may need to stop taking medicines (such as blood thinners, aspirin, or nonsteroidal anti-inflammatory drugs) before the procedure.   Do not eat or drink at least 6 hours before the procedure or as directed by your health care provider.  Arrange for a responsible adult, family member, or friend to take you home after the procedure. He or she should stay  with you for at least 24 hours after the procedure, until the medicine has worn off. PROCEDURE   An intravenous (IV) catheter will be inserted into one of your veins. Medicine will be able to flow directly into your body through this catheter. You may be given medicine through this tube to help prevent pain and help you relax.  The medical or dental procedure will be done. AFTER THE PROCEDURE  You will stay in a recovery area until the medicine has worn off. Your blood pressure and pulse will be checked.   Depending on the procedure you had, you may be allowed to go home when you can tolerate liquids and your pain is under control. Document Released: 10/20/2000 Document Revised: 01/30/2013 Document Reviewed: 10/02/2012 [facility] Patient Information 2015 [place], [place]. This information is not intended to replace advice given to you by your health care provider. Make sure you discuss any questions you have with your health care provider. Electrical Cardioversion Electrical cardioversion is the delivery of a jolt of electricity to change the rhythm of the heart. Sticky patches or metal paddles are placed on the chest to deliver the electricity from a device. This is done to restore a normal rhythm. A rhythm that is too fast or not regular keeps the heart from pumping well. Electrical cardioversion is done in an emergency if:   There is low or no blood pressure as a result of the heart rhythm.   Normal rhythm must be restored as fast as possible to protect the brain and heart from further damage.   It may save a  life. Cardioversion may be done for heart rhythms that are not immediately life threatening, such as atrial fibrillation or flutter, in which:   The heart is beating too fast or is not regular.   Medicine to change the rhythm has not worked.   It is safe to wait in order to allow time for preparation.  Symptoms of the abnormal rhythm are bothersome.  The risk of stroke and  other serious problems can be reduced. LET [facility] CARE PROVIDER KNOW ABOUT:   Any allergies you have.  All medicines you are taking, including vitamins, herbs, eye drops, creams, and over-the-counter medicines.  Previous problems you or members of your family have had with the use of anesthetics.   Any blood disorders you have.   Previous surgeries you have had.   Medical conditions you have. RISKS AND COMPLICATIONS  Generally, this is a safe procedure. However, problems can occur and include:   Breathing problems related to the anesthetic used.  A blood clot that breaks free and travels to other parts of your body. This could cause a stroke or other problems. The risk of this is lowered by use of blood-thinning medicine (anticoagulant) prior to the procedure.  Cardiac arrest (rare). BEFORE THE PROCEDURE   You may have tests to detect blood clots in your heart and to evaluate heart function.  You may start taking anticoagulants so your blood does not clot as easily.   Medicines may be given to help stabilize your heart rate and rhythm. PROCEDURE  You will be given medicine through an IV tube to reduce discomfort and make you sleepy (sedative).   An electrical shock will be delivered. AFTER THE PROCEDURE Your heart rhythm will be watched to make sure it does not change.  Document Released: 01/15/2002 Document Revised: 06/11/2013 Document Reviewed: 08/09/2012 [facility] Patient Information 2015 [place], [place]. This information is not intended to replace advice given to you by your health care provider. Make sure you discuss any questions you have with your health care provider.

## 2014-02-14 NOTE — Anesthesia Preprocedure Evaluation (Addendum)
Anesthesia Evaluation  Patient identified by MRN, date of birth, ID band Patient awake    Reviewed: Allergy & Precautions, NPO status , Patient's Chart, lab work & pertinent test results, reviewed documented beta blocker date and time   Airway Mallampati: II   Neck ROM: Full    Dental  (+) Missing, Chipped, Poor Dentition, Dental Advisory Given,    Pulmonary sleep apnea , former smoker (Quit years ago),  breath sounds clear to auscultation        Cardiovascular Exercise Tolerance: Poor hypertension, Pt. on medications + angina + CAD + dysrhythmias Atrial Fibrillation + pacemaker + Cardiac Defibrillator Rhythm:Irregular  ECHO 04/2013 EF 25-30%, non ischemic cardiomyopathy, CATH 04/2013 pulmonary HTN   Neuro/Psych Anxiety Depression CVA    GI/Hepatic   Endo/Other  diabetes, Poorly Controlled, Insulin Dependent  Renal/GU Renal InsufficiencyRenal diseaseGFR 48     Musculoskeletal   Abdominal (+) + obese,   Peds  Hematology   Anesthesia Other Findings   Reproductive/Obstetrics                           Anesthesia Physical Anesthesia Plan  ASA: III  Anesthesia Plan: MAC   Post-op Pain Management:    Induction: Intravenous  Airway Management Planned: Nasal Cannula  Additional Equipment:   Intra-op Plan:   Post-operative Plan:   Informed Consent: I have reviewed the patients History and Physical, chart, labs and discussed the procedure including the risks, benefits and alternatives for the proposed anesthesia with the patient or authorized representative who has indicated his/her understanding and acceptance.     Plan Discussed with:   Anesthesia Plan Comments:         Anesthesia Quick Evaluation

## 2014-02-14 NOTE — Anesthesia Postprocedure Evaluation (Signed)
  Anesthesia Post-op Note  Patient: Larry [name]  Procedure(s) Performed: Procedure(s): CARDIOVERSION (N/A)  Patient Location: Endoscopy Unit  Anesthesia Type:MAC  Level of Consciousness: awake, alert  and oriented  Airway and Oxygen Therapy: Patient Spontanous Breathing and Patient connected to nasal cannula oxygen  Post-op Pain: none  Post-op Assessment: Post-op Vital signs reviewed, Patient's Cardiovascular Status Stable, Respiratory Function Stable and Patent Airway  Post-op Vital Signs: Reviewed and stable  Last Vitals:  Filed Vitals:   02/14/14 1335  BP: 712/52  Resp:     Complications: No apparent anesthesia complications

## 2014-02-14 NOTE — Procedures (Signed)
Electrical Cardioversion Procedure Note Larry [name] [id number] 1950/09/21  Procedure: Electrical Cardioversion Indications:  Atrial Fibrillation  Procedure Details Consent: Risks of procedure as well as the alternatives and risks of each were explained to the (patient/caregiver).  Consent for procedure obtained. Time Out: Verified patient identification, verified procedure, site/side was marked, verified correct patient position, special equipment/implants available, medications/allergies/relevent history reviewed, required imaging and test results available.  Performed  Patient placed on cardiac monitor, pulse oximetry, supplemental oxygen as necessary.  Sedation given: Propofol per anesthesiology Pacer pads placed anterior and posterior chest.  Cardioverted 1 time(s).  Cardioverted at Larry [name].  Evaluation Findings: Post procedure EKG shows: a-paced, v-paced (out of atrial fibrillation).  Complications: None Patient did tolerate procedure well.  He is requiring RV pacing most of the time, will talk to Dr. Rayann Heman about BiV upgrade.    Larry [name] 02/14/2014, 1:51 PM

## 2014-02-15 ENCOUNTER — Encounter (HOSPITAL_COMMUNITY): Payer: 59

## 2014-02-15 ENCOUNTER — Other Ambulatory Visit: Payer: Self-pay | Admitting: Interventional Cardiology

## 2014-02-15 ENCOUNTER — Telehealth (HOSPITAL_COMMUNITY): Payer: Self-pay | Admitting: Vascular Surgery

## 2014-02-15 ENCOUNTER — Encounter (HOSPITAL_COMMUNITY): Payer: Self-pay | Admitting: Cardiology

## 2014-02-15 NOTE — Telephone Encounter (Signed)
Pt wife called she has question about pt medications.. Please advise

## 2014-02-20 ENCOUNTER — Ambulatory Visit (INDEPENDENT_AMBULATORY_CARE_PROVIDER_SITE_OTHER): Payer: 59 | Admitting: *Deleted

## 2014-02-20 ENCOUNTER — Encounter: Payer: Self-pay | Admitting: Internal Medicine

## 2014-02-20 ENCOUNTER — Encounter (HOSPITAL_COMMUNITY): Payer: 59

## 2014-02-20 DIAGNOSIS — I480 Paroxysmal atrial fibrillation: Secondary | ICD-10-CM

## 2014-02-20 DIAGNOSIS — I5022 Chronic systolic (congestive) heart failure: Secondary | ICD-10-CM

## 2014-02-20 DIAGNOSIS — Z95810 Presence of automatic (implantable) cardiac defibrillator: Secondary | ICD-10-CM

## 2014-02-20 LAB — MDC_IDC_ENUM_SESS_TYPE_REMOTE
Battery Remaining Longevity: 49 mo
Battery Voltage: 2.93 V
Brady Statistic AP VS Percent: 1 %
Brady Statistic RA Percent Paced: 80 %
Brady Statistic RV Percent Paced: 90 %
Date Time Interrogation Session: 20160113070011
HighPow Impedance: 60 Ohm
HighPow Impedance: 60 Ohm
Implantable Pulse Generator Serial Number: 622169
Lead Channel Impedance Value: 380 Ohm
Lead Channel Pacing Threshold Amplitude: 1 V
Lead Channel Pacing Threshold Pulse Width: 0.5 ms
Lead Channel Pacing Threshold Pulse Width: 0.5 ms
Lead Channel Sensing Intrinsic Amplitude: 11.3 mV
Lead Channel Setting Pacing Amplitude: 2.5 V
Lead Channel Setting Pacing Pulse Width: 0.5 ms
Lead Channel Setting Sensing Sensitivity: 0.5 mV
MDC IDC MSMT BATTERY REMAINING PERCENTAGE: 56 %
MDC IDC MSMT LEADCHNL RA PACING THRESHOLD AMPLITUDE: 0.75 V
MDC IDC MSMT LEADCHNL RA SENSING INTR AMPL: 2.6 mV
MDC IDC MSMT LEADCHNL RV IMPEDANCE VALUE: 340 Ohm
MDC IDC SET LEADCHNL RA PACING AMPLITUDE: 2 V
MDC IDC STAT BRADY AP VP PERCENT: 80 %
MDC IDC STAT BRADY AS VP PERCENT: 9.8 %
MDC IDC STAT BRADY AS VS PERCENT: 8.5 %
Zone Setting Detection Interval: 270 ms
Zone Setting Detection Interval: 335 ms

## 2014-02-20 NOTE — Progress Notes (Signed)
ICD remote received. Sensing, impedances consistent with previous device measurements. Histograms appropriate for patient and level of activity. 1.0% AT/AF + xarelto. No ventricular arrhythmias recorded. All other diagnostic data reviewed---CorVue abnormal late Nov/2015 x15d. Real time EGM demonstrates appropriate sensing and capture. Estimated longevity 4.35yrs. Due to see JA 4/16.

## 2014-02-22 ENCOUNTER — Encounter (HOSPITAL_COMMUNITY): Payer: 59

## 2014-02-22 ENCOUNTER — Ambulatory Visit (HOSPITAL_COMMUNITY)
Admission: RE | Admit: 2014-02-22 | Discharge: 2014-02-22 | Disposition: A | Discharge status: Home / Self Care | Payer: 59 | Source: Ambulatory Visit | Attending: Cardiology | Admitting: Cardiology

## 2014-02-22 VITALS — BP 96/60 | HR 60 | Wt 249.2 lb

## 2014-02-22 DIAGNOSIS — Z7901 Long term (current) use of anticoagulants: Secondary | ICD-10-CM | POA: Insufficient documentation

## 2014-02-22 DIAGNOSIS — N189 Chronic kidney disease, unspecified: Secondary | ICD-10-CM | POA: Insufficient documentation

## 2014-02-22 DIAGNOSIS — I480 Paroxysmal atrial fibrillation: Secondary | ICD-10-CM | POA: Insufficient documentation

## 2014-02-22 DIAGNOSIS — R5383 Other fatigue: Secondary | ICD-10-CM | POA: Insufficient documentation

## 2014-02-22 DIAGNOSIS — N183 Chronic kidney disease, stage 3 unspecified: Secondary | ICD-10-CM

## 2014-02-22 DIAGNOSIS — I2510 Atherosclerotic heart disease of native coronary artery without angina pectoris: Secondary | ICD-10-CM | POA: Diagnosis not present

## 2014-02-22 DIAGNOSIS — I5022 Chronic systolic (congestive) heart failure: Secondary | ICD-10-CM

## 2014-02-22 DIAGNOSIS — Z794 Long term (current) use of insulin: Secondary | ICD-10-CM | POA: Diagnosis not present

## 2014-02-22 DIAGNOSIS — E669 Obesity, unspecified: Secondary | ICD-10-CM | POA: Insufficient documentation

## 2014-02-22 DIAGNOSIS — F418 Other specified anxiety disorders: Secondary | ICD-10-CM | POA: Insufficient documentation

## 2014-02-22 DIAGNOSIS — G4733 Obstructive sleep apnea (adult) (pediatric): Secondary | ICD-10-CM

## 2014-02-22 DIAGNOSIS — E119 Type 2 diabetes mellitus without complications: Secondary | ICD-10-CM | POA: Diagnosis not present

## 2014-02-22 LAB — BASIC METABOLIC PANEL
ANION GAP: 11 (ref 5–15)
BUN: 33 mg/dL — AB (ref 6–23)
CO2: 27 mmol/L (ref 19–32)
Calcium: 8.9 mg/dL (ref 8.4–10.5)
Chloride: 96 mEq/L (ref 96–112)
Creatinine, Ser: 1.94 mg/dL — ABNORMAL HIGH (ref 0.50–1.35)
GFR, EST AFRICAN AMERICAN: 41 mL/min — AB (ref >90)
GFR, EST NON AFRICAN AMERICAN: 36 mL/min — AB (ref >90)
Glucose, Bld: 244 mg/dL — ABNORMAL HIGH (ref 70–99)
Potassium: 5.1 mmol/L (ref 3.5–5.1)
Sodium: 134 mmol/L — ABNORMAL LOW (ref 135–145)

## 2014-02-22 LAB — BRAIN NATRIURETIC PEPTIDE: B NATRIURETIC PEPTIDE 5: 952.2 pg/mL — AB (ref 0.0–100.0)

## 2014-02-22 MED ORDER — AMIODARONE HCL 200 MG PO TABS: 200.0000 mg | ORAL_TABLET | Freq: Every day | ORAL | Status: DC

## 2014-02-22 NOTE — Patient Instructions (Signed)
Decrease Amiodarone to 200 mg daily  Labs today  Your physician discussed the importance of regular exercise and recommended that you start walking for 15 minutes daily  Your physician recommends that you schedule a follow-up appointment in: 1 month

## 2014-02-22 NOTE — Progress Notes (Signed)
Patient ID: Larry [name], male   DOB: Nov 03, 1950, 64 y.o.   MRN: [id number] PCP: Dr. Rex Kras Referring MD: Dr. Irish Lack Endocrinologist: Dr Buddy Duty  64 yo with history of chronic systolic CHF (nonischemic cardiomyopathy), CKD, prior CVA, and paroxysmal atrial fibrillation.  Patient says that he has been known to have a low EF for years.  He has a [employer], [employer] ICD.  He had LHC in 2009 with nonobstructive disease. Last echo in 3/15 showed moderate LV dilation with EF 25-30%.    Patient had Scalp Level 8/15 showing low cardiac output, mildly elevated PCWP and primarily pulmonary venous hypertension.  He was transitioned from Lasix to torsemide and started on digoxin.  At a prior appointment, [name] was started and valsartan stopped.  He is currently taking only 20 mg torsemide, we had thought he was on 40 mg daily. Weight is up about 12 lbs.    After last appointment, adenosine Cardiolite was done showing large area of apical scar but no ischemia.  This raised concern for possible ischemic cardiomyopathy.  He was admitted later in 12/15 with acute on chronic systolic CHF.  He also was noted to have gone into atrial fibrillation during this hospitalization, amiodarone was started in preparation for possible DCCV.  Troponin was mildly elevated in the hospital in the absence of chest pain, likely demand ischemia.  He was diuresed with IV Lasix and was discharged soon after.  He was cardioverted back to NSR on 02/14/14.   Today, weight is down 10 lbs compared to last appointment.  He remains in NSR after recent DCCV.  His wife says he is doing better.  Less short of breath walking up steps. Dyspnea after walking about 500 feet. No chest pain. He has been seeing a psychologist, this seems to help his anxiety.     I had the [employer] representative interrogate his device today because he has been RV pacing a significant portion of the time.  Interrogation showed 84% RV pacing.  AV delay was increased with much less RV pacing.   Corevue showed impedance trending upwards.   Labs (2/15): LDL 103 Labs (3/15): K 4.1, creatinine 1.5, BNP 164 Labs (4/15): K 3.5, creatinine 3.3 => 1.9 => 2.1, BNP 96 Labs (4/15): K 4.3, creatinine 1.43, glucose 308 Labs (5/15): K 5.1 Creatinine 1.35  Labs (8/15): K 5 => 4.7 => 3.8, creatinine 1.19 => 1.57 => 1.6, digoxin 1.1 Labs (9/15): K 4.6, creatinine 1.32 => 1.9 => 1.1, digoxin 0.6 => 1.2 (not trough) Labs (10/15): K 4.5, creatinine 1.36, pro-BNP 1261 Labs (12/15): TSH 8.6 (elevated), free T3/free T4 normal, K 4.5, creatinine 1.71, digoxin 0.6, HCT 44.1, AST 38, ALT 44, TSH normal Labs (1/16): K 5.1, creatinine 1.96, LFTs normal  PMH: 1. Chronic systolic CHF: ? Nonischemic cardiomyopathy.  Most recent echo in 3/15 with EF 25-30%, moderate LV dilation, mild LVH, moderate diastolic dysfunction, IVC normal, mildly decreased RV systolic function.  Prior echo in 2011 also with EF 25-30%.  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  [employer] ICD. CPX 05/2013: FVC 3.89 (82%), FEV1 2.99 (83%), FEV1/FVC 77%, Peak HR 118 (74% predicted max), Peak VO2 12.4 (51% of predicted; when corrected to IBW =17.7), VE/VCO2 29.9, OUES 1.39, Peak RER 1.21.  RHC (8/15) with mean RA 6, PA 60/21 mean 36, mean PCWP 21, CI 1.81, PVR 3.6 WU.  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   2. Type II  diabetes 3. CKD 4. Monoclonal gammopathy of uncertain etiology.  5. CVA 2007 6. Degenerative disc disease.  7. Pituitary microadenoma: Nonfunctioning, s/p surgery in 2009.  8. OSA: Unable to tolerate Bipap/CPAP. 9. Depression 10. Hypothyroidism 11. Hyperlipidemia 12. Atrial fibrillation: Paroxysmal.  Patient is on Xarelto. DCCV 02/14/14 to NSR.  13. 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   FH: Father with MI x 2 and CHF, strong history of CAD on his father's side.   ROS: All systems  reviewed and negative except as per HPI.   Current Outpatient Prescriptions  Medication Sig Dispense Refill  . ALPRAZolam (XANAX) 0.5 MG tablet Take 0.5 mg by mouth 3 (three) times daily as needed for anxiety or sleep.    [name] amiodarone (PACERONE) 200 MG tablet Take 1 tablet (200 mg total) by mouth daily. 60 tablet 6  . atorvastatin (LIPITOR) 40 MG tablet Take 1 tablet (40 mg total) by mouth daily. 90 tablet 1  . digoxin (LANOXIN) 0.125 MG tablet Take 1 tablet (0.125 mg total) by mouth daily. 90 tablet 1  . DULoxetine (CYMBALTA) 30 MG capsule Take 60 mg by mouth 2 (two) times daily.     [name] GLUCAGON EMERGENCY 1 MG injection Inject 1 mg as directed once. For hypotension    . insulin aspart (NOVOLOG) 100 UNIT/ML injection Inject 30 Units into the skin 3 (three) times daily before meals.     [name] LANTUS SOLOSTAR 100 UNIT/ML Solostar Pen Inject 60 Units into the skin daily.    . metoprolol succinate (TOPROL-XL) 100 MG 24 hr tablet 100 mg in am and 50 mg in pm    . Multiple Vitamin (MULITIVITAMIN WITH MINERALS) TABS Take 1 tablet by mouth daily.    [name] oxyCODONE-acetaminophen (PERCOCET) 10-325 MG per tablet Take 1-2 tablets by mouth every 8 (eight) hours. For pain    . rivaroxaban (XARELTO) 15 MG TABS tablet Take 1 tablet (15 mg total) by mouth daily with supper. 30 tablet 3  . sacubitril-valsartan (ENTRESTO) 24-26 MG Take 1 tablet by mouth 2 (two) times daily. 60 tablet 6  . senna (SENOKOT) 8.6 MG tablet Take 1 tablet by mouth 2 (two) times daily.    [name] spironolactone (ALDACTONE) 25 MG tablet Take 1 tablet (25 mg total) by mouth daily. 30 tablet 6  . torsemide (DEMADEX) 20 MG tablet Take 3 tablets (60 mg total) by mouth daily. 90 tablet 6   No current facility-administered medications for this encounter.    Filed Vitals:   02/22/14 1119  BP: 96/60  Pulse: 60  Weight: 249 lb 4 oz (113.059 kg)  SpO2: 96%   General: NAD Wife present  Neck: Thick, JVP 7 cm. No thyromegaly or thyroid nodule.  Lungs:  Clear to auscultation bilaterally with normal respiratory effort. CV: Nondisplaced PMI.  Heart regular S1/S2, no S3/S4, no murmur.  No edema.  No carotid bruit.  Normal pedal pulses.  Abdomen: Obese, Soft, tender to palpation, obese, no hepatosplenomegaly, non-distended Skin: Intact without lesions or rashes.  Neurologic: Alert and oriented x 3.  Psych: Flat affect. Extremities: No clubbing or cyanosis.   Assessment/plan: 1. Chronic systolic CHF: Cardiomyopathy x years.  Thought to be nonischemic but Cardiolite in 12/15 showed apical scar (no ischemia).  EF has been stable at 25-30% (04/2013). Has [employer] ICD, narrow QRS so not CRT candidate. CPX VO2 low but slope ok.  NYHA class III symptoms, improved now that he is in NSR.  Weight is down and  exam and Corevue suggest improved volume status. He has been RV pacing 84% of the time.  - Pacemaker reprogrammed to limit RV pacing.   - Continue current torsemide, 60 mg daily.  - Continue digoxin give low output on RHC.  Last digoxin level ok. - Continue Toprol XL 100 qam/50 qpm and current spironolactone. - Continue Entresto 49/51 bid. - We have discussed the fact that he may be nearing the need for advanced therapies.  Weight will be an issue for transplant.  He may need LVAD in the near future.  - BMET/BNP today.   - Reinforced the need and importance of daily weights, a low sodium diet, and fluid restriction (less than 2 L a day). Instructed to call the HF clinic if weight increases more than 3 lbs overnight or 5 lbs in a week.  2. CKD: BMET today.    3. Atrial fibrillation: Paroxysmal.  Continue Xarelto (renally dosed). He is in NSR after DCCV.   Continue amiodarone to maintain NSR.  TSH and LFTs recently normal.  He will need a yearly eye exam. 4. Obesity:  Needs to lose weight.  Asked him to cut back on his portions and try low carb diet.   5. DM2: Continue to follow with endocrinology.  Glucose control still not ideal.  6. Depression/anxiety:  Seems to be ongoing issue.  He is very anxious and is now seeing a psychologist.  7. Fatigue: In addition to CHF, I think that OSA (that is untreated as he has had a hard time with CPAP) and depression play a large role. He has an appointment for a repeat sleep study but not until March.   I will try to arrange sooner.   8. CAD: Though to have nonischemic CMP, but Cardiolite showed large apical scar.  Given no ischemia or chest pain, would not do cardiac cath at this time given CKD.  Continue statin.  Not on ASA given Xarelto use.   Followup in 4 wks  Loralie Champagne 02/22/2014

## 2014-02-27 ENCOUNTER — Encounter (HOSPITAL_COMMUNITY): Payer: 59

## 2014-02-27 NOTE — Telephone Encounter (Signed)
Called pt to schedule Merlin remote transmission to recheck VP % per JA. Ok with pt for 03/13/14.

## 2014-03-01 ENCOUNTER — Encounter (HOSPITAL_COMMUNITY): Payer: 59

## 2014-03-06 ENCOUNTER — Encounter (HOSPITAL_COMMUNITY): Payer: 59

## 2014-03-08 ENCOUNTER — Encounter (HOSPITAL_COMMUNITY): Payer: 59

## 2014-03-13 ENCOUNTER — Telehealth: Payer: Self-pay | Admitting: *Deleted

## 2014-03-13 ENCOUNTER — Other Ambulatory Visit (HOSPITAL_COMMUNITY): Payer: Self-pay | Admitting: Cardiology

## 2014-03-13 ENCOUNTER — Encounter: Payer: Self-pay | Admitting: Internal Medicine

## 2014-03-13 ENCOUNTER — Encounter (HOSPITAL_COMMUNITY): Payer: 59

## 2014-03-13 ENCOUNTER — Ambulatory Visit (INDEPENDENT_AMBULATORY_CARE_PROVIDER_SITE_OTHER): Payer: 59 | Admitting: *Deleted

## 2014-03-13 DIAGNOSIS — I5023 Acute on chronic systolic (congestive) heart failure: Secondary | ICD-10-CM

## 2014-03-13 DIAGNOSIS — Z95810 Presence of automatic (implantable) cardiac defibrillator: Secondary | ICD-10-CM

## 2014-03-13 LAB — MDC_IDC_ENUM_SESS_TYPE_REMOTE
Battery Remaining Longevity: 50 mo
Battery Remaining Percentage: 55 %
Brady Statistic AS VP Percent: 4.2 %
Brady Statistic RA Percent Paced: 12 %
Brady Statistic RV Percent Paced: 75 %
Date Time Interrogation Session: 20160203085622
HighPow Impedance: 65 Ohm
HighPow Impedance: 65 Ohm
Implantable Pulse Generator Serial Number: 622169
Lead Channel Pacing Threshold Pulse Width: 0.5 ms
Lead Channel Pacing Threshold Pulse Width: 0.5 ms
Lead Channel Sensing Intrinsic Amplitude: 11.3 mV
Lead Channel Sensing Intrinsic Amplitude: 3.1 mV
Lead Channel Setting Pacing Amplitude: 2 V
Lead Channel Setting Pacing Amplitude: 2.5 V
Lead Channel Setting Pacing Pulse Width: 0.5 ms
Lead Channel Setting Sensing Sensitivity: 0.5 mV
MDC IDC MSMT BATTERY VOLTAGE: 2.93 V
MDC IDC MSMT LEADCHNL RA IMPEDANCE VALUE: 390 Ohm
MDC IDC MSMT LEADCHNL RA PACING THRESHOLD AMPLITUDE: 0.75 V
MDC IDC MSMT LEADCHNL RV IMPEDANCE VALUE: 350 Ohm
MDC IDC MSMT LEADCHNL RV PACING THRESHOLD AMPLITUDE: 1 V
MDC IDC SET ZONE DETECTION INTERVAL: 270 ms
MDC IDC SET ZONE DETECTION INTERVAL: 335 ms
MDC IDC STAT BRADY AP VP PERCENT: 11 %
MDC IDC STAT BRADY AP VS PERCENT: 63 %
MDC IDC STAT BRADY AS VS PERCENT: 22 %

## 2014-03-13 MED ORDER — SACUBITRIL-VALSARTAN 49-51 MG PO TABS: 1.0000 | ORAL_TABLET | Freq: Two times a day (BID) | ORAL | Status: DC

## 2014-03-13 NOTE — Telephone Encounter (Signed)
Left message regarding need for follow up with JA about remote transmission and to expect call from Lorenda Hatchet to schedule.

## 2014-03-13 NOTE — Progress Notes (Signed)
Remote ICD transmission.   

## 2014-03-15 ENCOUNTER — Encounter (HOSPITAL_COMMUNITY): Payer: 59

## 2014-03-17 ENCOUNTER — Other Ambulatory Visit: Payer: Self-pay | Admitting: Cardiology

## 2014-03-20 ENCOUNTER — Encounter (HOSPITAL_COMMUNITY): Payer: 59

## 2014-03-22 ENCOUNTER — Encounter (HOSPITAL_COMMUNITY): Payer: 59

## 2014-03-25 ENCOUNTER — Encounter: Payer: Self-pay | Admitting: Internal Medicine

## 2014-03-25 ENCOUNTER — Ambulatory Visit (INDEPENDENT_AMBULATORY_CARE_PROVIDER_SITE_OTHER): Payer: 59 | Admitting: Internal Medicine

## 2014-03-25 ENCOUNTER — Other Ambulatory Visit (HOSPITAL_COMMUNITY): Payer: Self-pay | Admitting: *Deleted

## 2014-03-25 VITALS — BP 110/72 | HR 71 | Ht 69.5 in | Wt 239.8 lb

## 2014-03-25 DIAGNOSIS — I5022 Chronic systolic (congestive) heart failure: Secondary | ICD-10-CM

## 2014-03-25 DIAGNOSIS — I4819 Other persistent atrial fibrillation: Secondary | ICD-10-CM

## 2014-03-25 DIAGNOSIS — I472 Ventricular tachycardia, unspecified: Secondary | ICD-10-CM

## 2014-03-25 DIAGNOSIS — I481 Persistent atrial fibrillation: Secondary | ICD-10-CM

## 2014-03-25 DIAGNOSIS — I480 Paroxysmal atrial fibrillation: Secondary | ICD-10-CM

## 2014-03-25 DIAGNOSIS — R55 Syncope and collapse: Secondary | ICD-10-CM

## 2014-03-25 DIAGNOSIS — Z7901 Long term (current) use of anticoagulants: Secondary | ICD-10-CM

## 2014-03-25 LAB — MDC_IDC_ENUM_SESS_TYPE_INCLINIC
Implantable Pulse Generator Serial Number: 622169
Lead Channel Pacing Threshold Pulse Width: 0.5 ms
Lead Channel Sensing Intrinsic Amplitude: 1.7 mV
Lead Channel Setting Pacing Amplitude: 2 V
Lead Channel Setting Pacing Amplitude: 2.5 V
Lead Channel Setting Pacing Pulse Width: 0.5 ms
MDC IDC MSMT LEADCHNL RV PACING THRESHOLD AMPLITUDE: 1 V
MDC IDC MSMT LEADCHNL RV SENSING INTR AMPL: 11.3 mV
MDC IDC SET LEADCHNL RV SENSING SENSITIVITY: 0.5 mV
Zone Setting Detection Interval: 270 ms
Zone Setting Detection Interval: 335 ms

## 2014-03-25 MED ORDER — METOPROLOL SUCCINATE ER 100 MG PO TB24: ORAL_TABLET | ORAL | Status: DC

## 2014-03-25 MED ORDER — AMIODARONE HCL 200 MG PO TABS: 200.0000 mg | ORAL_TABLET | Freq: Every day | ORAL | Status: DC

## 2014-03-25 MED ORDER — TORSEMIDE 20 MG PO TABS: 60.0000 mg | ORAL_TABLET | Freq: Every day | ORAL | Status: DC

## 2014-03-25 NOTE — Progress Notes (Signed)
Electrophysiology Office Note   Date:  03/25/2014   ID:  Larry [name], DOB 1950/04/15, MRN [id number]  PCP:  [name], MD  Cardiologist:  Dr Aundra Dubin Primary Electrophysiologist: Thompson Grayer, MD    Chief Complaint  Patient presents with  . Atrial Fibrillation     History of Present Illness: Larry [name] is a 64 y.o. male who presents today for electrophysiology evaluation.   The patient has had difficulty with atrial fibrillation recently.  He underwent cardioversion in early January after presenting with progressive CHF.  He has done "much better" since then but continues to have dypsnea and fatigue.  When clearly in sinus rhythm, he reported SOB with walking for 2-3 minutes.  He has since returned to afib but is actually unaware that he is back in afib at this time.  He does not have palpitations.  He has fatigue. Today, he denies symptoms of chest pain, shortness of breath, orthopnea, PND, lower extremity edema, claudication, dizziness, presyncope, syncope, bleeding, or neurologic sequela. The patient is tolerating medications without difficulties and is otherwise without complaint today.    Past Medical History  Diagnosis Date  . Cardiomyopathy     nonischemic (EF 25%)  . CAD (coronary artery disease)     nonobstructive CAD by cath 2009  . Obesity   . CVA (cerebral vascular accident)     CVA 2007 without residual deficit  . Pituitary tumor      (nonfunctionging pituitary microadenoma) s/p gamma knife surgery at [facility] 2009 with neuropathy and retinopathy  . Depression   . Erectile dysfunction   . DDD (degenerative disc disease)   . OSA (obstructive sleep apnea)   . Monoclonal gammopathy   . CRI (chronic renal insufficiency)     CRI (baseline creatinine 1.6)  . CKD (chronic kidney disease), stage III     moderate  . 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 Hypogonadotropic hypogonadism   . Polycythemia, secondary     improved/resolved  .  Hypercholesterolemia   . Back pain     F/B [name]  . Monoclonal gammopathy of unknown significance     per [name]  . 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 Neck pain     F/B [name]  . PAF (paroxysmal atrial fibrillation) 06/04/10    Hx of, has device  . History of diverticulitis of colon   . Type II or unspecified type diabetes mellitus with neurological manifestations, uncontrolled 2000  . Nonproliferative diabetic retinopathy NOS(362.03)   . Ventricular tachycardia 10/25/13    appropriate ICD shock, VT CL 230-240 msec  . Confusion 02/01/2014  . Hypertension   . Anginal pain   . Dysrhythmia   . AICD (automatic cardioverter/defibrillator) present   . Presence of permanent cardiac pacemaker   . Anxiety   . Shortness of breath dyspnea   . Peripheral vascular disease     2007   Past Surgical History  Procedure Laterality Date  . Gamma knife surgery for pituitary tumor    . Tonsillectomy    . Cardiac defibrillator placement  02/19/10    By JA.   . Right heart catheterization N/A 09/17/2013    Procedure: RIGHT HEART CATH;  Surgeon: [name], MD;  Location: [facility] CATH LAB;  Service: Cardiovascular;  Laterality: N/A;  . Cardiac catheterization    . Brain surgery    . Insert / replace / remove pacemaker    .  Cardioversion N/A 02/14/2014    Procedure: CARDIOVERSION;  Surgeon: [name], MD;  Location: [place];  Service: Cardiovascular;  Laterality: N/A;     Current Outpatient Prescriptions  Medication Sig Dispense Refill  . ALPRAZolam (XANAX) 0.5 MG tablet Take 0.5 mg by mouth 3 (three) times daily as needed for anxiety or sleep.    [name] amiodarone (PACERONE) 200 MG tablet Take 1 tablet (200 mg total) by mouth daily. 90 tablet 3  . atorvastatin (LIPITOR) 40 MG tablet TAKE 1 TABLET DAILY (Patient taking differently: TAKE 1 TABLET BY MOUTH DAILY) 90 tablet 3  . digoxin (LANOXIN) 0.125 MG tablet TAKE 1 TABLET DAILY (Patient  taking differently: TAKE 1 TABLET BY MOUTH DAILY) 90 tablet 3  . DULoxetine (CYMBALTA) 30 MG capsule Take 60 mg by mouth 2 (two) times daily.     [name] ENTRESTO 24-26 MG Take 1 tablet by mouth 2 (two) times daily.  6  . GLUCAGON EMERGENCY 1 MG injection Inject 1 mg as directed once. For hypotension    . insulin aspart (NOVOLOG) 100 UNIT/ML injection Inject 30 Units into the skin 3 (three) times daily before meals.     [name] LANTUS SOLOSTAR 100 UNIT/ML Solostar Pen Inject 60 Units into the skin daily.    [name] levothyroxine (SYNTHROID, LEVOTHROID) 25 MCG tablet Take 1 tablet by mouth 30 minutes prior to breakfast daily  5  . metoprolol succinate (TOPROL-XL) 100 MG 24 hr tablet Take 50 mg by mouth in the am and take 50 mg by mouth in the pm    . Multiple Vitamin (MULITIVITAMIN WITH MINERALS) TABS Take 1 tablet by mouth daily.    [name] oxyCODONE-acetaminophen (PERCOCET) 10-325 MG per tablet Take 1 tablet by mouth every 4 (four) hours as needed for pain. For pain    . rivaroxaban (XARELTO) 15 MG TABS tablet Take 1 tablet (15 mg total) by mouth daily with supper. 30 tablet 3  . senna (SENOKOT) 8.6 MG tablet Take 1 tablet by mouth 2 (two) times daily.    [name] spironolactone (ALDACTONE) 25 MG tablet Take 1 tablet (25 mg total) by mouth daily. 30 tablet 6  . torsemide (DEMADEX) 20 MG tablet Take 3 tablets (60 mg total) by mouth daily. 270 tablet 3  . brimonidine (ALPHAGAN) 0.2 % ophthalmic solution Place 1 drop into the left eye as directed. 48 hours prior to surgery  1  . ketorolac (ACULAR) 0.5 % ophthalmic solution Place 1 drop into the left eye as directed. Start 1 week prior to surgery  4  . ofloxacin (OCUFLOX) 0.3 % ophthalmic solution Place 1 drop into both eyes as directed. Start 72 hours prior to surgery  1  . prednisoLONE acetate (PRED FORTE) 1 % ophthalmic suspension Place 1 drop into the left eye as directed. Four times a day. Start after surgery  1   No current facility-administered medications for this visit.      Allergies:   Bydureon and Losartan potassium   Social History:  The patient  reports that he quit smoking about 31 years ago. His smoking use included Cigars. He does not have any smokeless tobacco history on file. He reports that he does not drink alcohol or use illicit drugs.   Family History:  The patient's family history includes Cancer in his mother; Dementia in his mother; Depression in his mother; Diabetes in his brother and paternal uncle; Heart disease in his brother and father; Hypertension in his brother, father, and sister; Lung cancer in his  father, paternal uncle, and another family member; Obesity in his brother, father, and sister.    ROS:  Please see the history of present illness.   All other systems are reviewed and negative.    PHYSICAL EXAM: VS:  BP 110/72 mmHg  Pulse 71  Ht 5' 9.5" (1.765 m)  Wt 239 lb 12.8 oz (108.773 kg)  BMI 34.92 kg/m2 , BMI Body mass index is 34.92 kg/(m^2). GEN: Well nourished, well developed, in no acute distress HEENT: normal Neck: no JVD, carotid bruits, or masses Cardiac: irregularly irregular  Respiratory:  clear to auscultation bilaterally, normal work of breathing GI: soft, nontender, nondistended, + BS MS: no deformity or atrophy Skin: warm and dry, device pocket is well healed Neuro:  Strength and sensation are intact Psych: euthymic mood, full affect  EKG:  EKG is ordered today. The ekg ordered today shows afib, V pacing  Device interrogation is reviewed today in detail.  See PaceArt for details.   Recent Labs: 01/14/2014: Pro B Natriuretic peptide (BNP) 5769.0* 02/03/2014: Platelets 245; TSH 8.596* 02/14/2014: ALT 53; Hemoglobin 16.0; Magnesium 2.5 02/22/2014: B Natriuretic Peptide 952.2*; BUN 33*; Creatinine 1.94*; Potassium 5.1; Sodium 134*    Lipid Panel  No results found for: CHOL, TRIG, HDL, CHOLHDL, VLDL, LDLCALC, LDLDIRECT   Wt Readings from Last 3 Encounters:  03/25/14 239 lb 12.8 oz (108.773 kg)  02/22/14  249 lb 4 oz (113.059 kg)  02/03/14 256 lb 6.3 oz (116.3 kg)     ASSESSMENT AND PLAN:  1.  Persistent afib This is a very complicated situation requiring a very high level of decision making.  Dr Aundra Dubin and I discussed the patient at length in the office today.  He has significant LA enlargement and has failed cardioversion on amiodarone.  I therefore worry that our long term ability to maintain sinus rhythm is quite low.  Therapeutic strategies for afib including medicine and ablation were discussed in detail with the patient today. Risk, benefits, and alternatives to EP study and radiofrequency ablation for afib were also discussed in detail today. I think that our options at this time are 1. Rate control long term, 2. GENETIC AF evaluation (as an alternative option), 3. Increase amiodarone to 200mg  BID x 2-3 weeks and then repeat cardioversion.  (If we decide on this approach then I would check an amiodarone level once stable on 200mg  BID as maybe he requires this dose to achieve a therapeutic level), or 4. Ablation.  After long discussion with the patient today, he would like to be evaluated for Genetic AF.  IF he qualifies then we will proceed according.  IF he does not then we will likely increase amiodarone to 200mg  BID and repeat cardioversion.  IF he fails repeat cardioversion then we will consider rate control vs ablation as our final strategy.  Continue anticoagulation  2. Chronic systolic dysfunction He has stable NYHA Class III CHF I have reduced lower pacing rate to 50 bpm today and reduced metoprolol to 50mg  BID to allow for intrinsic conduction as his intrinsic QRS is narrow Until we achieve/ maintain sinus, I do not feel that there is an indication for CRT.  3. Obesity Weight loss is advised  4. Hypertensive cardiovascular disease with CHF Reduce metoprolol as above  Follow-up with Dr Aundra Dubin in 2 weeks.  Either will be enrolled in Genetic AF by then or will need to increase  amidoarone on that visit  I will see again in 6 weeks to follow-up.  I  have introduced him to Roderic Palau NP today in case he needs her services in the future.  Current medicines are reviewed at length with the patient today.   The patient has concerns regarding his medicines.  The following changes were made today:  none  Labs/ tests ordered today include:  Orders Placed This Encounter  Procedures  . Implantable device check  . EKG 12-Lead     Signed, Thompson Grayer, MD  03/25/2014 5:06 PM     [facility] [address] [place] [address] [phone] (office) [phone] (fax)

## 2014-03-25 NOTE — Patient Instructions (Signed)
Your physician recommends that you schedule a follow-up appointment on 04/11/14 with Dr Aundra Dubin at the [facility]  Your physician has recommended you make the following change in your medication:  1) Decrease Metoprolol to 50mg  twice daily

## 2014-03-27 ENCOUNTER — Encounter (HOSPITAL_COMMUNITY): Payer: 59

## 2014-03-27 ENCOUNTER — Encounter: Payer: Self-pay | Admitting: Cardiology

## 2014-03-29 ENCOUNTER — Encounter (HOSPITAL_COMMUNITY): Payer: 59

## 2014-04-03 ENCOUNTER — Encounter (HOSPITAL_COMMUNITY): Payer: 59

## 2014-04-05 ENCOUNTER — Encounter (HOSPITAL_COMMUNITY): Payer: 59

## 2014-04-11 ENCOUNTER — Encounter (HOSPITAL_COMMUNITY): Payer: Self-pay

## 2014-04-11 ENCOUNTER — Ambulatory Visit (HOSPITAL_COMMUNITY)
Admission: RE | Admit: 2014-04-11 | Discharge: 2014-04-11 | Disposition: A | Discharge status: Home / Self Care | Payer: 59 | Source: Ambulatory Visit | Attending: Cardiology | Admitting: Cardiology

## 2014-04-11 VITALS — BP 100/60 | HR 60 | Wt 240.0 lb

## 2014-04-11 DIAGNOSIS — I2510 Atherosclerotic heart disease of native coronary artery without angina pectoris: Secondary | ICD-10-CM | POA: Diagnosis not present

## 2014-04-11 DIAGNOSIS — N189 Chronic kidney disease, unspecified: Secondary | ICD-10-CM | POA: Insufficient documentation

## 2014-04-11 DIAGNOSIS — I5022 Chronic systolic (congestive) heart failure: Secondary | ICD-10-CM

## 2014-04-11 DIAGNOSIS — R5383 Other fatigue: Secondary | ICD-10-CM | POA: Insufficient documentation

## 2014-04-11 DIAGNOSIS — E669 Obesity, unspecified: Secondary | ICD-10-CM | POA: Diagnosis not present

## 2014-04-11 DIAGNOSIS — F418 Other specified anxiety disorders: Secondary | ICD-10-CM | POA: Diagnosis not present

## 2014-04-11 DIAGNOSIS — I4891 Unspecified atrial fibrillation: Secondary | ICD-10-CM | POA: Diagnosis not present

## 2014-04-11 DIAGNOSIS — E119 Type 2 diabetes mellitus without complications: Secondary | ICD-10-CM | POA: Insufficient documentation

## 2014-04-11 DIAGNOSIS — I481 Persistent atrial fibrillation: Secondary | ICD-10-CM

## 2014-04-11 DIAGNOSIS — I4819 Other persistent atrial fibrillation: Secondary | ICD-10-CM

## 2014-04-11 MED ORDER — AMIODARONE HCL 200 MG PO TABS: 200.0000 mg | ORAL_TABLET | Freq: Two times a day (BID) | ORAL | Status: DC

## 2014-04-11 NOTE — Patient Instructions (Addendum)
Increase Amiodarone to 200 mg Twice daily   Your physician recommends that you schedule a follow-up appointment in: 2-3 weeks with Dr Aundra Dubin

## 2014-04-11 NOTE — Progress Notes (Signed)
Patient ID: Larry [name], male   DOB: [birth year], 64 y.o.   MRN: [id number] PCP: Dr. Rex Kras Referring MD: Dr. Irish Lack Endocrinologist: Dr Buddy Duty  64 yo with history of chronic systolic CHF (nonischemic cardiomyopathy), CKD, prior CVA, and paroxysmal atrial fibrillation.  Patient says that he has been known to have a low EF for years.  He has a [employer], [employer] ICD.  He had LHC in 2009 with nonobstructive disease. Last echo in 3/15 showed moderate LV dilation with EF 25-30%.    Patient had [place] 8/15 showing low cardiac output, mildly elevated PCWP and primarily pulmonary venous hypertension.  He was transitioned from Lasix to torsemide and started on digoxin.  At a prior appointment, [name] was started and valsartan stopped.  He is currently taking only 20 mg torsemide, we had thought he was on 40 mg daily. Weight is up about 12 lbs.    After last appointment, adenosine Cardiolite was done showing large area of apical scar but no ischemia.  This raised concern for possible ischemic cardiomyopathy.  He was admitted later in 12/15 with acute on chronic systolic CHF.  He also was noted to have gone into atrial fibrillation during this hospitalization, amiodarone was started in preparation for possible DCCV.  Troponin was mildly elevated in the hospital in the absence of chest pain, likely demand ischemia.  He was diuresed with IV Lasix and was discharged soon after.  He was cardioverted back to NSR on 02/14/14.   He returns for follow up. Limited mobility. Unable to walk 5 minutes at a time due to fatigue.  SOB with steps. Fatigue walking in the clinic. Sleep study plan for next week. Saw Dr Rayann Heman in mid February with discussed plan for repeat DC-CV vs genetic AF trial - they chose the latter.  Weight at home 239 pounds. Trying to cut back portions and watching salt intake.    Labs (2/15): LDL 103 Labs (3/15): K 4.1, creatinine 1.5, BNP 164 Labs (4/15): K 3.5, creatinine 3.3 => 1.9 => 2.1, BNP 96 Labs (4/15): K  4.3, creatinine 1.43, glucose 308 Labs (5/15): K 5.1 Creatinine 1.35  Labs (8/15): K 5 => 4.7 => 3.8, creatinine 1.19 => 1.57 => 1.6, digoxin 1.1 Labs (9/15): K 4.6, creatinine 1.32 => 1.9 => 1.1, digoxin 0.6 => 1.2 (not trough) Labs (10/15): K 4.5, creatinine 1.36, pro-BNP 1261 Labs (12/15): TSH 8.6 (elevated), free T3/free T4 normal, K 4.5, creatinine 1.71, digoxin 0.6, HCT 44.1, AST 38, ALT 44, TSH normal Labs (1/16): K 5.1, creatinine 1.96, LFTs normal  PMH: 1. Chronic systolic CHF: ? Nonischemic cardiomyopathy.  Most recent echo in 3/15 with EF 25-30%, moderate LV dilation, mild LVH, moderate diastolic dysfunction, IVC normal, mildly decreased RV systolic function.  Prior echo in 2011 also with EF 25-30%.  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  St Jude ICD. CPX 05/2013: FVC 3.89 (82%), FEV1 2.99 (83%), FEV1/FVC 77%, Peak HR 118 (74% predicted max), Peak VO2 12.4 (51% of predicted; when corrected to IBW =17.7), VE/VCO2 29.9, OUES 1.39, Peak RER 1.21.  RHC (8/15) with mean RA 6, PA 60/21 mean 36, mean PCWP 21, CI 1.81, PVR 3.6 WU.  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   2. Type II diabetes 3. CKD 4. Monoclonal gammopathy of uncertain etiology.  5. CVA 2007 6. Degenerative disc disease.  7. Pituitary microadenoma: Nonfunctioning, s/p surgery in 2009.  8. OSA: Unable to tolerate Bipap/CPAP.  9. Depression 10. Hypothyroidism 11. Hyperlipidemia 12. Atrial fibrillation:   Patient is on Xarelto. DCCV 02/14/14 to NSR.  13. 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   FH: Father with MI x 2 and CHF, strong history of CAD on his father's side.   ROS: All systems reviewed and negative except as per HPI.   Current Outpatient Prescriptions  Medication Sig Dispense Refill  . ALPRAZolam (XANAX) 0.5 MG tablet Take 0.5 mg by mouth 3 (three) times daily as needed for  anxiety or sleep.    [name] amiodarone (PACERONE) 200 MG tablet Take 1 tablet (200 mg total) by mouth daily. 90 tablet 3  . atorvastatin (LIPITOR) 40 MG tablet TAKE 1 TABLET DAILY (Patient taking differently: TAKE 1 TABLET BY MOUTH DAILY) 90 tablet 3  . brimonidine (ALPHAGAN) 0.2 % ophthalmic solution Place 1 drop into the left eye as directed. 48 hours prior to surgery  1  . digoxin (LANOXIN) 0.125 MG tablet TAKE 1 TABLET DAILY (Patient taking differently: TAKE 1 TABLET BY MOUTH DAILY) 90 tablet 3  . DULoxetine (CYMBALTA) 30 MG capsule Take 60 mg by mouth 2 (two) times daily.     [name] ENTRESTO 24-26 MG Take 1 tablet by mouth 2 (two) times daily.  6  . GLUCAGON EMERGENCY 1 MG injection Inject 1 mg as directed once. For hypotension    . insulin aspart (NOVOLOG) 100 UNIT/ML injection Inject 24 Units into the skin 3 (three) times daily before meals.     [name] ketorolac (ACULAR) 0.5 % ophthalmic solution Place 1 drop into the left eye as directed. Start 1 week prior to surgery  4  . LANTUS SOLOSTAR 100 UNIT/ML Solostar Pen Inject 36 Units into the skin daily.     [name] levothyroxine (SYNTHROID, LEVOTHROID) 25 MCG tablet Take 1 tablet by mouth 30 minutes prior to breakfast daily  5  . metoprolol succinate (TOPROL-XL) 100 MG 24 hr tablet Take 50 mg by mouth in the am and take 50 mg by mouth in the pm    . Multiple Vitamin (MULITIVITAMIN WITH MINERALS) TABS Take 1 tablet by mouth daily.    [name] ofloxacin (OCUFLOX) 0.3 % ophthalmic solution Place 1 drop into both eyes as directed. Start 72 hours prior to surgery  1  . oxyCODONE-acetaminophen (PERCOCET) 10-325 MG per tablet Take 1 tablet by mouth every 4 (four) hours as needed for pain. For pain    . prednisoLONE acetate (PRED FORTE) 1 % ophthalmic suspension Place 1 drop into the left eye as directed. Four times a day. Start after surgery  1  . rivaroxaban (XARELTO) 15 MG TABS tablet Take 1 tablet (15 mg total) by mouth daily with supper. 30 tablet 3  . senna (SENOKOT) 8.6  MG tablet Take 1 tablet by mouth 2 (two) times daily.    [name] spironolactone (ALDACTONE) 25 MG tablet Take 1 tablet (25 mg total) by mouth daily. 30 tablet 6  . torsemide (DEMADEX) 20 MG tablet Take 3 tablets (60 mg total) by mouth daily. 270 tablet 3  . zolpidem (AMBIEN) 10 MG tablet Take 10 mg by mouth at bedtime as needed for sleep.     No current facility-administered medications for this encounter.    Filed Vitals:   04/11/14 1455  BP: 100/60  Pulse: 60  Weight: 227 lb (102.967 kg)  SpO2: 97%   General: NAD Wife present  Neck: Thick, JVP 7 cm. No thyromegaly or thyroid nodule.  Lungs: Clear to  auscultation bilaterally with normal respiratory effort. CV: Nondisplaced PMI.  Heart regular S1/S2, no S3/S4, no murmur.  No edema.  No carotid bruit.  Normal pedal pulses.  Abdomen: Obese, Soft, tender to palpation, obese, no hepatosplenomegaly, non-distended Skin: Intact without lesions or rashes.  Neurologic: Alert and oriented x 3.  Psych: Flat affect. Extremities: No clubbing or cyanosis.   Assessment/plan: 1. Chronic systolic CHF: Cardiomyopathy x years.  Thought to be nonischemic but Cardiolite in 12/15 showed apical scar (no ischemia).  EF has been stable at 25-30% (04/2013). Has St Jude ICD, narrow QRS so not CRT candidate. CPX VO2 low but slope ok.  NYHA class III symptoms - Continue current torsemide, 60 mg daily.  - Continue digoxin give low output on RHC.  Last digoxin level ok. - Continue Toprol XL  50 mg twice a day.  - Continue 25 mg daily spironolactone. - Continue Entresto 24/26 mg twice a day.  - We have discussed the fact that he may be nearing the need for advanced therapies.  Weight will be an issue for transplant.  He may need LVAD in the near future.  - Reinforced the need and importance of daily weights, a low sodium diet, and fluid restriction (less than 2 L a day). Instructed to call the HF clinic if weight increases more than 3 lbs overnight or 5 lbs in a week.   2. CKD:  3. Atrial fibrillation: Back in A fib today.   Continue Xarelto (renally dosed). He is in NSR after DCCV.   Continue amiodarone to maintain NSR.  TSH and LFTs recently normal.  He will need a yearly eye exam. 4. Obesity:  Needs to lose weight.  Asked him to cut back on his portions and try low carb diet.   5. DM2: Continue to follow with endocrinology.  Glucose control still not ideal.  6. Depression/anxiety: Seems to be ongoing issue.  He is very anxious and is now seeing a psychologist.  7. Fatigue: In addition to CHF, I think that OSA (that is untreated as he has had a hard time with CPAP) and depression play a large role. He has an appointment for a repeat sleep study but not until March.   I will try to arrange sooner.   8. CAD: Though to have nonischemic CMP, but Cardiolite showed large apical scar.  Given no ischemia or chest pain, would not do cardiac cath at this time given CKD.  Continue statin.  Not on ASA given Xarelto use.   Follow up 3 weeks  CLEGG,AMY NP-C 04/11/2014  Patient seen and examined with Darrick Grinder, NP. We discussed all aspects of the encounter. I agree with the assessment and plan as stated above.   Very difficult situation. He has advanced HF symptoms now NYHA III-IIIB. He failed recent DC-CV and Drs. Allred and Aundra Dubin have been discussing options. He has considered GENETIC-AF but that is being deferred as he is quite symptomatic and repeat DC-CV is being planned. He is not felt to be candidate for AF ablation. He has a narrow QRS but is RV pacing > 80% of the time.  It is thought that restoring NSR may limit RV pacing and improve symptoms. However, he was fairly clear that when he was in NSR he didn't feel much better.   I have discussed the plan with Drs. Allred and Mclean. Will increase amio to 200 bid and plan repeat DC-CV in several weeks followed by RHC as needed. My suspicion is that  this may not be enough to ameliorate his symptoms even if he does hold  NSR. Although QRS is narrow if he continues with high burden of RV pacing may benefit from CRT upgrade. Finally, I talked to him and his wife about possible RHC and advanced therapies most likely IV inotropes. They would be willing to consider as needed. He is not Tx candidate and VAD team has been concerned about his candidacy due to depression.   Total time spent 45 minutes. Over half that time spent discussing above.   Benay Spice 5:47 PM

## 2014-04-13 NOTE — Addendum Note (Signed)
Encounter addended by: Georga Kaufmann, CCT on: 04/13/2014 11:12 AM<BR>     Documentation filed: Charges VN

## 2014-04-16 ENCOUNTER — Ambulatory Visit (HOSPITAL_BASED_OUTPATIENT_CLINIC_OR_DEPARTMENT_OTHER): Payer: 59 | Attending: Pulmonary Disease | Admitting: Radiology

## 2014-04-17 NOTE — Sleep Study (Unsigned)
Larry [name], Olaf      Was unable to do CPAP pressure of 5, 6, and 7 cm H2o.   He was switch over to Bipap pressure of 8/4 cm H20 with easy breath on.  He, still was unable to do the titration.  He said he cloud not breath with the mask.  The tech tried  Several mask ([place], Airfit  N-10,  Eason,  Mirage FX, Airfit  F-10 , Simplus FFM)  on him .   He would like  to come back to be desensitization so someone can show and help him with  breathing with the mask on.  Pt also said his back was hurting a lot and wanted to reschedule the study.

## 2014-04-23 ENCOUNTER — Ambulatory Visit (HOSPITAL_BASED_OUTPATIENT_CLINIC_OR_DEPARTMENT_OTHER): Payer: 59 | Admitting: Radiology

## 2014-04-26 ENCOUNTER — Telehealth: Payer: Self-pay | Admitting: Internal Medicine

## 2014-04-26 NOTE — Telephone Encounter (Signed)
Called pt and attempted to schedule new patient appt.  left message.   Dx: Chronic kidney disease, Stage III/IgG monoclonal gammopathy of uncertain significance.   Referring: [name]

## 2014-04-30 ENCOUNTER — Ambulatory Visit (HOSPITAL_COMMUNITY)
Admission: RE | Admit: 2014-04-30 | Discharge: 2014-04-30 | Disposition: A | Discharge status: Home / Self Care | Payer: 59 | Source: Ambulatory Visit | Attending: Cardiology | Admitting: Cardiology

## 2014-04-30 ENCOUNTER — Encounter (HOSPITAL_COMMUNITY): Payer: Self-pay

## 2014-04-30 VITALS — BP 112/68 | HR 60 | Wt 238.5 lb

## 2014-04-30 DIAGNOSIS — I481 Persistent atrial fibrillation: Secondary | ICD-10-CM

## 2014-04-30 DIAGNOSIS — E669 Obesity, unspecified: Secondary | ICD-10-CM | POA: Diagnosis not present

## 2014-04-30 DIAGNOSIS — N189 Chronic kidney disease, unspecified: Secondary | ICD-10-CM | POA: Insufficient documentation

## 2014-04-30 DIAGNOSIS — E119 Type 2 diabetes mellitus without complications: Secondary | ICD-10-CM | POA: Insufficient documentation

## 2014-04-30 DIAGNOSIS — R5383 Other fatigue: Secondary | ICD-10-CM | POA: Diagnosis not present

## 2014-04-30 DIAGNOSIS — I5022 Chronic systolic (congestive) heart failure: Secondary | ICD-10-CM | POA: Diagnosis present

## 2014-04-30 DIAGNOSIS — I472 Ventricular tachycardia, unspecified: Secondary | ICD-10-CM

## 2014-04-30 DIAGNOSIS — I4819 Other persistent atrial fibrillation: Secondary | ICD-10-CM

## 2014-04-30 DIAGNOSIS — I5023 Acute on chronic systolic (congestive) heart failure: Secondary | ICD-10-CM | POA: Diagnosis not present

## 2014-04-30 DIAGNOSIS — F418 Other specified anxiety disorders: Secondary | ICD-10-CM | POA: Diagnosis not present

## 2014-04-30 DIAGNOSIS — I4891 Unspecified atrial fibrillation: Secondary | ICD-10-CM | POA: Diagnosis not present

## 2014-04-30 DIAGNOSIS — N183 Chronic kidney disease, stage 3 unspecified: Secondary | ICD-10-CM

## 2014-04-30 DIAGNOSIS — I2510 Atherosclerotic heart disease of native coronary artery without angina pectoris: Secondary | ICD-10-CM | POA: Diagnosis not present

## 2014-04-30 LAB — CBC
HCT: 45.3 % (ref 39.0–52.0)
Hemoglobin: 15.1 g/dL (ref 13.0–17.0)
MCH: 31.5 pg (ref 26.0–34.0)
MCHC: 33.3 g/dL (ref 30.0–36.0)
MCV: 94.6 fL (ref 78.0–100.0)
Platelets: 181 10*3/uL (ref 150–400)
RBC: 4.79 MIL/uL (ref 4.22–5.81)
RDW: 14.7 % (ref 11.5–15.5)
WBC: 7.2 10*3/uL (ref 4.0–10.5)

## 2014-04-30 LAB — COMPREHENSIVE METABOLIC PANEL
ALT: 22 U/L (ref 0–53)
AST: 21 U/L (ref 0–37)
Albumin: 3.8 g/dL (ref 3.5–5.2)
Alkaline Phosphatase: 72 U/L (ref 39–117)
Anion gap: 9 (ref 5–15)
BILIRUBIN TOTAL: 0.7 mg/dL (ref 0.3–1.2)
BUN: 32 mg/dL — ABNORMAL HIGH (ref 6–23)
CHLORIDE: 99 mmol/L (ref 96–112)
CO2: 29 mmol/L (ref 19–32)
CREATININE: 2.35 mg/dL — AB (ref 0.50–1.35)
Calcium: 9.4 mg/dL (ref 8.4–10.5)
GFR calc Af Amer: 33 mL/min — ABNORMAL LOW (ref >90)
GFR, EST NON AFRICAN AMERICAN: 28 mL/min — AB (ref >90)
Glucose, Bld: 141 mg/dL — ABNORMAL HIGH (ref 70–99)
POTASSIUM: 4.7 mmol/L (ref 3.5–5.1)
Sodium: 137 mmol/L (ref 135–145)
Total Protein: 7.1 g/dL (ref 6.0–8.3)

## 2014-04-30 LAB — TSH: TSH: 16.87 u[IU]/mL — ABNORMAL HIGH (ref 0.350–4.500)

## 2014-04-30 LAB — BRAIN NATRIURETIC PEPTIDE: B Natriuretic Peptide: 780.1 pg/mL — ABNORMAL HIGH (ref 0.0–100.0)

## 2014-04-30 LAB — DIGOXIN LEVEL: Digoxin Level: 1.8 ng/mL (ref 0.8–2.0)

## 2014-04-30 NOTE — Progress Notes (Signed)
Patient ID: Larry [name], male   DOB: 11-Feb-1950, 64 y.o.   MRN: [id number] PCP: Dr. Rex Kras Endocrinologist: Dr Buddy Duty  64 yo with history of chronic systolic CHF (nonischemic cardiomyopathy), CKD, prior CVA, and paroxysmal atrial fibrillation.  Patient says that he has been known to have a low EF for years.  He has a [employer], [employer] ICD.  He had LHC in 2009 with nonobstructive disease. Last echo in 3/15 showed moderate LV dilation with EF 25-30%.    Patient had [place] 8/15 showing low cardiac output, mildly elevated PCWP and primarily pulmonary venous hypertension.  He was transitioned from Lasix to torsemide and started on digoxin.  At a prior appointment, [name] was started and valsartan stopped.   Adenosine Cardiolite was done in 12/15 showing large area of apical scar but no ischemia.  This raised concern for possible ischemic cardiomyopathy.  He was admitted later in 12/15 with acute on chronic systolic CHF.  He also was noted to have gone into atrial fibrillation during this hospitalization, amiodarone was started in preparation for possible DCCV.  Troponin was mildly elevated in the hospital in the absence of chest pain, likely demand ischemia.  He was diuresed with IV Lasix and was discharged soon after.  He was cardioverted back to NSR on 02/14/14.   He was back in atrial fibrillation at last appointment.  Today, he is in atrial fibrillation as well with some RV pacing.  He is still working on getting on CPAP, waiting for a new mask.  Generally feels a bit better symptomatically.  Weight is up 3 lbs but can walk about 500 feet before becoming short of breath.  Short of breath going up inclines.  No orthopnea/PND.  Mood is better.  No chest pain.    ECG: atrial fibrillation with RV pacing  Labs (2/15): LDL 103 Labs (3/15): K 4.1, creatinine 1.5, BNP 164 Labs (4/15): K 3.5, creatinine 3.3 => 1.9 => 2.1, BNP 96 Labs (4/15): K 4.3, creatinine 1.43, glucose 308 Labs (5/15): K 5.1 Creatinine 1.35  Labs  (8/15): K 5 => 4.7 => 3.8, creatinine 1.19 => 1.57 => 1.6, digoxin 1.1 Labs (9/15): K 4.6, creatinine 1.32 => 1.9 => 1.1, digoxin 0.6 => 1.2 (not trough) Labs (10/15): K 4.5, creatinine 1.36, pro-BNP 1261 Labs (12/15): TSH 8.6 (elevated), free T3/free T4 normal, K 4.5, creatinine 1.71, digoxin 0.6, HCT 44.1, AST 38, ALT 44, TSH normal Labs (1/16): K 5.1, creatinine 1.96, LFTs normal, BNP 952  PMH: 1. Chronic systolic CHF: ? Nonischemic cardiomyopathy.  Most recent echo in 3/15 with EF 25-30%, moderate LV dilation, mild LVH, moderate diastolic dysfunction, IVC normal, mildly decreased RV systolic function.  Prior echo in 2011 also with EF 25-30%.  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  St Jude ICD. CPX 05/2013: FVC 3.89 (82%), FEV1 2.99 (83%), FEV1/FVC 77%, Peak HR 118 (74% predicted max), Peak VO2 12.4 (51% of predicted; when corrected to IBW =17.7), VE/VCO2 29.9, OUES 1.39, Peak RER 1.21.  RHC (8/15) with mean RA 6, PA 60/21 mean 36, mean PCWP 21, CI 1.81, PVR 3.6 WU.  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   2. Type II diabetes 3. CKD 4. Monoclonal gammopathy of uncertain etiology.  5. CVA 2007 6. Degenerative disc disease.  7. Pituitary microadenoma: Nonfunctioning, s/p surgery in 2009.  8. OSA: Unable to tolerate Bipap/CPAP. 9. Depression 10. Hypothyroidism 11. Hyperlipidemia 12. Atrial fibrillation:   Patient  is on Xarelto. DCCV 02/14/14 to NSR.  13. 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   FH: Father with MI x 2 and CHF, strong history of CAD on his father's side.   ROS: All systems reviewed and negative except as per HPI.   Current Outpatient Prescriptions  Medication Sig Dispense Refill  . ALPRAZolam (XANAX) 0.5 MG tablet Take 0.5 mg by mouth 3 (three) times daily as needed for anxiety or sleep.    [name] amiodarone (PACERONE) 200 MG tablet Take 1  tablet (200 mg total) by mouth 2 (two) times daily. 60 tablet 3  . atorvastatin (LIPITOR) 40 MG tablet TAKE 1 TABLET DAILY (Patient taking differently: TAKE 1 TABLET BY MOUTH DAILY) 90 tablet 3  . brimonidine (ALPHAGAN) 0.2 % ophthalmic solution Place 1 drop into the left eye 2 (two) times daily. 48 hours prior to surgery  1  . digoxin (LANOXIN) 0.125 MG tablet TAKE 1 TABLET DAILY (Patient taking differently: TAKE 1 TABLET BY MOUTH DAILY) 90 tablet 3  . DULoxetine (CYMBALTA) 30 MG capsule Take 60 mg by mouth 2 (two) times daily.     [name] ENTRESTO 24-26 MG Take 1 tablet by mouth 2 (two) times daily.  6  . GLUCAGON EMERGENCY 1 MG injection Inject 1 mg as directed once. For hypotension    . insulin aspart (NOVOLOG) 100 UNIT/ML injection Inject 24 Units into the skin 3 (three) times daily before meals.     [name] ketorolac (ACULAR) 0.5 % ophthalmic solution Place 1 drop into the left eye 2 (two) times daily. Start 1 week prior to surgery  4  . LANTUS SOLOSTAR 100 UNIT/ML Solostar Pen Inject 36 Units into the skin at bedtime.     [name] levothyroxine (SYNTHROID, LEVOTHROID) 25 MCG tablet Take 50 mcg by mouth daily before breakfast.   5  . metoprolol succinate (TOPROL-XL) 100 MG 24 hr tablet Take 50 mg by mouth in the am and take 50 mg by mouth in the pm    . Multiple Vitamin (MULITIVITAMIN WITH MINERALS) TABS Take 1 tablet by mouth daily.    [name] ofloxacin (OCUFLOX) 0.3 % ophthalmic solution Place 1 drop into both eyes 2 (two) times daily. Start 72 hours prior to surgery  1  . oxyCODONE-acetaminophen (PERCOCET) 10-325 MG per tablet Take 1 tablet by mouth every 4 (four) hours as needed for pain. For pain    . prednisoLONE acetate (PRED FORTE) 1 % ophthalmic suspension Place 1 drop into the left eye 2 (two) times daily. Four times a day. Start after surgery  1  . rivaroxaban (XARELTO) 15 MG TABS tablet Take 1 tablet (15 mg total) by mouth daily with supper. 30 tablet 3  . senna (SENOKOT) 8.6 MG tablet Take 1 tablet by  mouth at bedtime.     [name] spironolactone (ALDACTONE) 25 MG tablet Take 1 tablet (25 mg total) by mouth daily. 30 tablet 6  . torsemide (DEMADEX) 20 MG tablet Take 3 tablets (60 mg total) by mouth daily. 270 tablet 3  . Melatonin 10 MG TABS Take 10 mg by mouth at bedtime.     No current facility-administered medications for this encounter.    Filed Vitals:   04/30/14 0829  BP: 112/68  Pulse: 60  Weight: 238 lb 8 oz (108.183 kg)  SpO2: 96%   General: NAD Wife present  Neck: Thick, JVP 7 cm. No thyromegaly or thyroid nodule.  Lungs: Clear to auscultation bilaterally with normal respiratory effort.  CV: Nondisplaced PMI.  Heart regular S1/S2, no S3/S4, no murmur.  No edema.  No carotid bruit.  Normal pedal pulses.  Abdomen: Obese, Soft, tender to palpation, obese, no hepatosplenomegaly, non-distended Skin: Intact without lesions or rashes.  Neurologic: Alert and oriented x 3.  Psych: Flat affect. Extremities: No clubbing or cyanosis.   Assessment/plan: 1. Chronic systolic CHF: Cardiomyopathy x years.  Thought to be nonischemic but Cardiolite in 12/15 showed apical scar (no ischemia).  EF has been stable at 25-30% (04/2013). Has St Jude ICD, narrow QRS so has not been CRT candidate.  However, now that he is in persistent atrial fibrillation, he is doing a significant amount of RV pacing. CPX (4/15) VO2 low but slope ok.  NYHA class III symptoms, seems a bit improved.  He does not appear volume overloaded on exam.  - Continue current torsemide, 60 mg daily.  - Continue digoxin give low output on RHC.  Check digoxin level today.  - Continue Toprol XL 50 mg twice a day.  - Continue 25 mg daily spironolactone. - Continue Entresto 24/26 mg twice a day.  - If we cannot keep him in NSR, given heavy burden of RV pacing would consider BiV upgrade (+/- AV nodal ablation).  He will follow with Dr. Rayann Heman. - We have discussed the fact that he may be nearing the need for advanced therapies.  Weight will  be an issue for transplant.  He may need LVAD in the near future => I am going to get repeat CPX and RHC next week (after cardioverting this week).   - Reinforced the need and importance of daily weights, a low sodium diet, and fluid restriction (less than 2 L a day). Instructed to call the HF clinic if weight increases more than 3 lbs overnight or 5 lbs in a week.  2. CKD: BMET today.  3. Atrial fibrillation: Persistent.  He has not missed Xarelto doses.  He is on amiodarone 200 mg bid.  Will check LFTs and TSH today, he will need regular eye exams.  Will get CBC today as well.  I will arrange for DCCV later this week.  If this fails, will need to consider CRT upgrade. 4. Obesity:  Needs to lose weight.  Asked him to cut back on his portions and try low carb diet.   5. DM2: Continue to follow with endocrinology.  Glucose control still not ideal.  6. Depression/anxiety: Seems to be ongoing issue.  Doing better now that he is seeing a psychologist.   7. Fatigue: In addition to CHF, I think that OSA (that is untreated as he has had a hard time with CPAP) and depression play a large role. He is still working on getting a CPAP mask that works.   8. CAD: Though to have nonischemic CMP, but Cardiolite showed large apical scar.  Given no ischemia or chest pain, would not do cardiac cath at this time given CKD.  Continue statin.  Not on ASA given Xarelto use.   Follow up 2 weeks  Loralie Champagne  04/30/2014

## 2014-04-30 NOTE — Patient Instructions (Addendum)
You have been scheduled for a Cardioversion on Thursday, March 24th at 11:30 am. No eating or drinking after midnight. Hold all morning medications that morning EXCEPT XARELTO (take this before you arrive!) Please arrive at Main Entr[facility] A of [facility] at 9:30 am. Make sure you have someone to drive you home after this procedure.  You have been scheduled for a Right Heart Catheterization on Wednesday, March 30th at 9:00 am. No eating or drinking after midnight. Hold ALL medications the morning of procedure INCLUDING Insulin. Only take HALF of Lantus insulin (18 units) day before procedure. Please arrive at [place] [facility] at 7:00 am. Make sure you have someone to drive you home after this procedure.  You have been scheduled for a Cardiopulmonary Exercise Test on Friday, April 8th at 10:30 am. Please eat a light breakfast and avoid caffeine 24 hours before this test. Wear comfortable clothes and shoes for this test.  Follow up 3 weeks.  Do the following things EVERYDAY: 1) Weigh yourself in the morning before breakfast. Write it down and keep it in a log. 2) Take your medicines as prescribed 3) Eat low salt foods-Limit salt (sodium) to 2000 mg per day.  4) Stay as active as you can everyday 5) Limit all fluids for the day to less than 2 liters

## 2014-05-01 ENCOUNTER — Telehealth (HOSPITAL_COMMUNITY): Payer: Self-pay | Admitting: Vascular Surgery

## 2014-05-01 DIAGNOSIS — I5022 Chronic systolic (congestive) heart failure: Secondary | ICD-10-CM

## 2014-05-01 DIAGNOSIS — I480 Paroxysmal atrial fibrillation: Secondary | ICD-10-CM

## 2014-05-01 MED ORDER — DIGOXIN 125 MCG PO TABS: 0.0625 mg | ORAL_TABLET | Freq: Every day | ORAL | Status: DC

## 2014-05-01 MED ORDER — SODIUM CHLORIDE 0.9 % IV SOLN
INTRAVENOUS | Status: DC
  Administered 2014-05-02: 500 mL via INTRAVENOUS

## 2014-05-01 MED ORDER — TORSEMIDE 20 MG PO TABS: ORAL_TABLET | ORAL | Status: DC

## 2014-05-01 NOTE — Telephone Encounter (Signed)
Left message for patient or wife to call us back.  We did not make any med changes at yesterdays appointment but may be asking about meds to hold before upcoming procedures we scheduled.  Renee Pain

## 2014-05-01 NOTE — Telephone Encounter (Signed)
Spoke w/pt's wife regarding lab results and med changes that need to be made, she is aware and agreeable, will recheck labs 3/30 when pt comes for cath

## 2014-05-01 NOTE — Telephone Encounter (Signed)
Pt wife called she would like to speak to a nurse about medication changes.. Please advise

## 2014-05-01 NOTE — Telephone Encounter (Signed)
Pt wife called she would like to speak to [facility] about medication changes before pt procedure tomorrow.. Please advise

## 2014-05-01 NOTE — Addendum Note (Signed)
Encounter addended by: Yehuda Mao on: 05/01/2014  8:42 AM<BR>     Documentation filed: Charges VN

## 2014-05-02 ENCOUNTER — Ambulatory Visit (HOSPITAL_COMMUNITY): Payer: 59 | Admitting: Anesthesiology

## 2014-05-02 ENCOUNTER — Encounter (HOSPITAL_COMMUNITY)
Admission: RE | Disposition: A | Discharge status: Home / Self Care | Payer: Self-pay | Source: Ambulatory Visit | Attending: Cardiology

## 2014-05-02 ENCOUNTER — Encounter (HOSPITAL_COMMUNITY): Payer: Self-pay | Admitting: *Deleted

## 2014-05-02 ENCOUNTER — Ambulatory Visit (HOSPITAL_COMMUNITY)
Admission: RE | Admit: 2014-05-02 | Discharge: 2014-05-02 | Disposition: A | Discharge status: Home / Self Care | Payer: 59 | Source: Ambulatory Visit | Attending: Cardiology | Admitting: Cardiology

## 2014-05-02 DIAGNOSIS — Z794 Long term (current) use of insulin: Secondary | ICD-10-CM | POA: Diagnosis not present

## 2014-05-02 DIAGNOSIS — I2510 Atherosclerotic heart disease of native coronary artery without angina pectoris: Secondary | ICD-10-CM | POA: Insufficient documentation

## 2014-05-02 DIAGNOSIS — I429 Cardiomyopathy, unspecified: Secondary | ICD-10-CM | POA: Insufficient documentation

## 2014-05-02 DIAGNOSIS — G4733 Obstructive sleep apnea (adult) (pediatric): Secondary | ICD-10-CM | POA: Insufficient documentation

## 2014-05-02 DIAGNOSIS — E669 Obesity, unspecified: Secondary | ICD-10-CM | POA: Diagnosis not present

## 2014-05-02 DIAGNOSIS — E039 Hypothyroidism, unspecified: Secondary | ICD-10-CM | POA: Insufficient documentation

## 2014-05-02 DIAGNOSIS — E785 Hyperlipidemia, unspecified: Secondary | ICD-10-CM | POA: Insufficient documentation

## 2014-05-02 DIAGNOSIS — E119 Type 2 diabetes mellitus without complications: Secondary | ICD-10-CM | POA: Diagnosis not present

## 2014-05-02 DIAGNOSIS — I739 Peripheral vascular disease, unspecified: Secondary | ICD-10-CM | POA: Diagnosis not present

## 2014-05-02 DIAGNOSIS — I5022 Chronic systolic (congestive) heart failure: Secondary | ICD-10-CM | POA: Insufficient documentation

## 2014-05-02 DIAGNOSIS — Z87891 Personal history of nicotine dependence: Secondary | ICD-10-CM | POA: Diagnosis not present

## 2014-05-02 DIAGNOSIS — Z8673 Personal history of transient ischemic attack (TIA), and cerebral infarction without residual deficits: Secondary | ICD-10-CM | POA: Diagnosis not present

## 2014-05-02 DIAGNOSIS — I4891 Unspecified atrial fibrillation: Secondary | ICD-10-CM

## 2014-05-02 DIAGNOSIS — I480 Paroxysmal atrial fibrillation: Secondary | ICD-10-CM | POA: Diagnosis not present

## 2014-05-02 DIAGNOSIS — F329 Major depressive disorder, single episode, unspecified: Secondary | ICD-10-CM | POA: Diagnosis not present

## 2014-05-02 DIAGNOSIS — F419 Anxiety disorder, unspecified: Secondary | ICD-10-CM | POA: Insufficient documentation

## 2014-05-02 DIAGNOSIS — N189 Chronic kidney disease, unspecified: Secondary | ICD-10-CM | POA: Insufficient documentation

## 2014-05-02 HISTORY — PX: CARDIOVERSION: SHX1299

## 2014-05-02 LAB — GLUCOSE, CAPILLARY: GLUCOSE-CAPILLARY: 228 mg/dL — AB (ref 70–99)

## 2014-05-02 SURGERY — CARDIOVERSION
Anesthesia: Monitor Anesthesia Care

## 2014-05-02 MED ORDER — SODIUM CHLORIDE 0.9 % IJ SOLN: 3.0000 mL | Freq: Two times a day (BID) | INTRAMUSCULAR | Status: DC

## 2014-05-02 MED ORDER — SODIUM CHLORIDE 0.9 % IV SOLN: 250.0000 mL | INTRAVENOUS | Status: DC

## 2014-05-02 MED ORDER — PROPOFOL 10 MG/ML IV BOLUS
INTRAVENOUS | Status: DC | PRN
  Administered 2014-05-02: 70 mg via INTRAVENOUS

## 2014-05-02 MED ORDER — LIDOCAINE HCL (CARDIAC) 20 MG/ML IV SOLN
INTRAVENOUS | Status: DC | PRN
  Administered 2014-05-02: 50 mg via INTRAVENOUS

## 2014-05-02 MED ORDER — SODIUM CHLORIDE 0.9 % IJ SOLN: 3.0000 mL | INTRAMUSCULAR | Status: DC | PRN

## 2014-05-02 MED ORDER — HYDROCORTISONE 1 % EX CREA
1.0000 "application " | TOPICAL_CREAM | Freq: Three times a day (TID) | CUTANEOUS | Status: DC | PRN
  Filled 2014-05-02: qty 28

## 2014-05-02 NOTE — Anesthesia Preprocedure Evaluation (Addendum)
Anesthesia Evaluation  Patient identified by MRN, date of birth, ID band Patient awake    Reviewed: Allergy & Precautions, NPO status , Patient's Chart, lab work & pertinent test results  Airway Mallampati: II  TM Distance: >3 FB Neck ROM: Full    Dental no notable dental hx.    Pulmonary neg pulmonary ROS, shortness of breath, sleep apnea , former smoker,  breath sounds clear to auscultation  Pulmonary exam normal       Cardiovascular hypertension, Pt. on home beta blockers and Pt. on medications + angina + CAD, + Peripheral Vascular Disease and +CHF + dysrhythmias + pacemaker + Cardiac Defibrillator Rhythm:Regular Rate:Normal     Neuro/Psych PSYCHIATRIC DISORDERS Anxiety Depression CVA    GI/Hepatic negative GI ROS, Neg liver ROS,   Endo/Other  negative endocrine ROSdiabetes, Type 2, Insulin Dependent, Oral Hypoglycemic Agents  Renal/GU CRFRenal disease     Musculoskeletal  (+) Arthritis -,   Abdominal   Peds  Hematology negative hematology ROS (+)   Anesthesia Other Findings   Reproductive/Obstetrics                            Anesthesia Physical Anesthesia Plan  ASA: III  Anesthesia Plan: MAC   Post-op Pain Management:    Induction: Intravenous  Airway Management Planned: Mask  Additional Equipment: None  Intra-op Plan:   Post-operative Plan: Extubation in OR  Informed Consent: I have reviewed the patients History and Physical, chart, labs and discussed the procedure including the risks, benefits and alternatives for the proposed anesthesia with the patient or authorized representative who has indicated his/her understanding and acceptance.   Dental advisory given  Plan Discussed with: CRNA  Anesthesia Plan Comments:         Anesthesia Quick Evaluation                                  Anesthesia Evaluation  Patient identified by MRN, date of birth, ID band Patient  awake    Reviewed: Allergy & Precautions, NPO status , Patient's Chart, lab work & pertinent test results, reviewed documented beta blocker date and time   Airway Mallampati: II   Neck ROM: Full    Dental  (+) Missing, Chipped, Poor Dentition, Dental Advisory Given,    Pulmonary sleep apnea , former smoker (Quit years ago),  breath sounds clear to auscultation        Cardiovascular Exercise Tolerance: Poor hypertension, Pt. on medications + angina + CAD + dysrhythmias Atrial Fibrillation + pacemaker + Cardiac Defibrillator Rhythm:Irregular  ECHO 04/2013 EF 25-30%, non ischemic cardiomyopathy, CATH 04/2013 pulmonary HTN   Neuro/Psych Anxiety Depression CVA    GI/Hepatic   Endo/Other  diabetes, Poorly Controlled, Insulin Dependent  Renal/GU Renal InsufficiencyRenal diseaseGFR 48     Musculoskeletal   Abdominal (+) + obese,   Peds  Hematology   Anesthesia Other Findings   Reproductive/Obstetrics                           Anesthesia Physical Anesthesia Plan  ASA: III  Anesthesia Plan: MAC   Post-op Pain Management:    Induction: Intravenous  Airway Management Planned: Nasal Cannula  Additional Equipment:   Intra-op Plan:   Post-operative Plan:   Informed Consent: I have reviewed the patients History and Physical, chart, labs and discussed the procedure including the  risks, benefits and alternatives for the proposed anesthesia with the patient or authorized representative who has indicated his/her understanding and acceptance.     Plan Discussed with:   Anesthesia Plan Comments:         Anesthesia Quick Evaluation                                   Anesthesia Evaluation  Patient identified by MRN, date of birth, ID band Patient awake    Reviewed: Allergy & Precautions, NPO status , Patient's Chart, lab work & pertinent test results, reviewed documented beta blocker date and time   Airway Mallampati:  II   Neck ROM: Full    Dental  (+) Missing, Chipped, Poor Dentition, Dental Advisory Given,    Pulmonary sleep apnea , former smoker (Quit years ago),  breath sounds clear to auscultation        Cardiovascular Exercise Tolerance: Poor hypertension, Pt. on medications + angina + CAD + dysrhythmias Atrial Fibrillation + pacemaker + Cardiac Defibrillator Rhythm:Irregular  ECHO 04/2013 EF 25-30%, non ischemic cardiomyopathy, CATH 04/2013 pulmonary HTN   Neuro/Psych Anxiety Depression CVA    GI/Hepatic   Endo/Other  diabetes, Poorly Controlled, Insulin Dependent  Renal/GU Renal InsufficiencyRenal diseaseGFR 48     Musculoskeletal   Abdominal (+) + obese,   Peds  Hematology   Anesthesia Other Findings   Reproductive/Obstetrics                           Anesthesia Physical Anesthesia Plan  ASA: III  Anesthesia Plan: MAC   Post-op Pain Management:    Induction: Intravenous  Airway Management Planned: Nasal Cannula  Additional Equipment:   Intra-op Plan:   Post-operative Plan:   Informed Consent: I have reviewed the patients History and Physical, chart, labs and discussed the procedure including the risks, benefits and alternatives for the proposed anesthesia with the patient or authorized representative who has indicated his/her understanding and acceptance.     Plan Discussed with:   Anesthesia Plan Comments:         Anesthesia Quick Evaluation                                   Anesthesia Evaluation  Patient identified by MRN, date of birth, ID band Patient awake    Reviewed: Allergy & Precautions, NPO status , Patient's Chart, lab work & pertinent test results, reviewed documented beta blocker date and time   Airway Mallampati: II   Neck ROM: Full    Dental  (+) Missing, Chipped, Poor Dentition, Dental Advisory Given,    Pulmonary sleep apnea , former smoker (Quit years ago),  breath sounds clear to  auscultation        Cardiovascular Exercise Tolerance: Poor hypertension, Pt. on medications + angina + CAD + dysrhythmias Atrial Fibrillation + pacemaker + Cardiac Defibrillator Rhythm:Irregular  ECHO 04/2013 EF 25-30%, non ischemic cardiomyopathy, CATH 04/2013 pulmonary HTN   Neuro/Psych Anxiety Depression CVA    GI/Hepatic   Endo/Other  diabetes, Poorly Controlled, Insulin Dependent  Renal/GU Renal InsufficiencyRenal diseaseGFR 48     Musculoskeletal   Abdominal (+) + obese,   Peds  Hematology   Anesthesia Other Findings   Reproductive/Obstetrics  Anesthesia Physical Anesthesia Plan  ASA: III  Anesthesia Plan: MAC   Post-op Pain Management:    Induction: Intravenous  Airway Management Planned: Nasal Cannula  Additional Equipment:   Intra-op Plan:   Post-operative Plan:   Informed Consent: I have reviewed the patients History and Physical, chart, labs and discussed the procedure including the risks, benefits and alternatives for the proposed anesthesia with the patient or authorized representative who has indicated his/her understanding and acceptance.     Plan Discussed with:   Anesthesia Plan Comments:         Anesthesia Quick Evaluation

## 2014-05-02 NOTE — H&P (View-Only) (Signed)
Patient ID: Larry [name], male   DOB: 12-03-1950, 64 y.o.   MRN: [id number] PCP: Dr. Rex [name] Endocrinologist: Dr Larry [name]  64 yo with history of chronic systolic CHF (nonischemic cardiomyopathy), CKD, prior CVA, and paroxysmal atrial fibrillation.  Patient says that he has been known to have a low EF for years.  He has a [employer], [employer] ICD.  He had LHC in 2009 with nonobstructive disease. Last echo in 3/15 showed moderate LV dilation with EF 25-30%.    Patient had [place] 8/15 showing low cardiac output, mildly elevated PCWP and primarily pulmonary venous hypertension.  He was transitioned from Lasix to torsemide and started on digoxin.  At a prior appointment, Larry [name] was started and valsartan stopped.   Adenosine Cardiolite was done in 12/15 showing large area of apical scar but no ischemia.  This raised concern for possible ischemic cardiomyopathy.  He was admitted later in 12/15 with acute on chronic systolic CHF.  He also was noted to have gone into atrial fibrillation during this hospitalization, amiodarone was started in preparation for possible DCCV.  Troponin was mildly elevated in the hospital in the absence of chest pain, likely demand ischemia.  He was diuresed with IV Lasix and was discharged soon after.  He was cardioverted back to NSR on 02/14/14.   He was back in atrial fibrillation at last appointment.  Today, he is in atrial fibrillation as well with some RV pacing.  He is still working on getting on CPAP, waiting for a new mask.  Generally feels a bit better symptomatically.  Weight is up 3 lbs but can walk about 500 feet before becoming short of breath.  Short of breath going up inclines.  No orthopnea/PND.  Mood is better.  No chest pain.    ECG: atrial fibrillation with RV pacing  Labs (2/15): LDL 103 Labs (3/15): K 4.1, creatinine 1.5, BNP 164 Labs (4/15): K 3.5, creatinine 3.3 => 1.9 => 2.1, BNP 96 Labs (4/15): K 4.3, creatinine 1.43, glucose 308 Labs (5/15): K 5.1 Creatinine 1.35  Labs  (8/15): K 5 => 4.7 => 3.8, creatinine 1.19 => 1.57 => 1.6, digoxin 1.1 Labs (9/15): K 4.6, creatinine 1.32 => 1.9 => 1.1, digoxin 0.6 => 1.2 (not trough) Labs (10/15): K 4.5, creatinine 1.36, pro-BNP 1261 Labs (12/15): TSH 8.6 (elevated), free T3/free T4 normal, K 4.5, creatinine 1.71, digoxin 0.6, HCT 44.1, AST 38, ALT 44, TSH normal Labs (1/16): K 5.1, creatinine 1.96, LFTs normal, BNP 952  PMH: 1. Chronic systolic CHF: ? Nonischemic cardiomyopathy.  Most recent echo in 3/15 with EF 25-30%, moderate LV dilation, mild LVH, moderate diastolic dysfunction, IVC normal, mildly decreased RV systolic function.  Prior echo in 2011 also with EF 25-30%.  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  St Jude ICD. CPX 05/2013: FVC 3.89 (82%), FEV1 2.99 (83%), FEV1/FVC 77%, Peak HR 118 (74% predicted max), Peak VO2 12.4 (51% of predicted; when corrected to IBW =17.7), VE/VCO2 29.9, OUES 1.39, Peak RER 1.21.  RHC (8/15) with mean RA 6, PA 60/21 mean 36, mean PCWP 21, CI 1.81, PVR 3.6 WU.  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   2. Type II diabetes 3. CKD 4. Monoclonal gammopathy of uncertain etiology.  5. CVA 2007 6. Degenerative disc disease.  7. Pituitary microadenoma: Nonfunctioning, s/p surgery in 2009.  8. OSA: Unable to tolerate Bipap/CPAP. 9. Depression 10. Hypothyroidism 11. Hyperlipidemia 12. Atrial fibrillation:   Patient  is on Xarelto. DCCV 02/14/14 to NSR.  13. 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   FH: Father with MI x 2 and CHF, strong history of CAD on his father's side.   ROS: All systems reviewed and negative except as per HPI.   Current Outpatient Prescriptions  Medication Sig Dispense Refill  . ALPRAZolam (XANAX) 0.5 MG tablet Take 0.5 mg by mouth 3 (three) times daily as needed for anxiety or sleep.    [name] amiodarone (PACERONE) 200 MG tablet Take 1  tablet (200 mg total) by mouth 2 (two) times daily. 60 tablet 3  . atorvastatin (LIPITOR) 40 MG tablet TAKE 1 TABLET DAILY (Patient taking differently: TAKE 1 TABLET BY MOUTH DAILY) 90 tablet 3  . brimonidine (ALPHAGAN) 0.2 % ophthalmic solution Place 1 drop into the left eye 2 (two) times daily. 48 hours prior to surgery  1  . digoxin (LANOXIN) 0.125 MG tablet TAKE 1 TABLET DAILY (Patient taking differently: TAKE 1 TABLET BY MOUTH DAILY) 90 tablet 3  . DULoxetine (CYMBALTA) 30 MG capsule Take 60 mg by mouth 2 (two) times daily.     [name] ENTRESTO 24-26 MG Take 1 tablet by mouth 2 (two) times daily.  6  . GLUCAGON EMERGENCY 1 MG injection Inject 1 mg as directed once. For hypotension    . insulin aspart (NOVOLOG) 100 UNIT/ML injection Inject 24 Units into the skin 3 (three) times daily before meals.     [name] ketorolac (ACULAR) 0.5 % ophthalmic solution Place 1 drop into the left eye 2 (two) times daily. Start 1 week prior to surgery  4  . LANTUS SOLOSTAR 100 UNIT/ML Solostar Pen Inject 36 Units into the skin at bedtime.     [name] levothyroxine (SYNTHROID, LEVOTHROID) 25 MCG tablet Take 50 mcg by mouth daily before breakfast.   5  . metoprolol succinate (TOPROL-XL) 100 MG 24 hr tablet Take 50 mg by mouth in the am and take 50 mg by mouth in the pm    . Multiple Vitamin (MULITIVITAMIN WITH MINERALS) TABS Take 1 tablet by mouth daily.    [name] ofloxacin (OCUFLOX) 0.3 % ophthalmic solution Place 1 drop into both eyes 2 (two) times daily. Start 72 hours prior to surgery  1  . oxyCODONE-acetaminophen (PERCOCET) 10-325 MG per tablet Take 1 tablet by mouth every 4 (four) hours as needed for pain. For pain    . prednisoLONE acetate (PRED FORTE) 1 % ophthalmic suspension Place 1 drop into the left eye 2 (two) times daily. Four times a day. Start after surgery  1  . rivaroxaban (XARELTO) 15 MG TABS tablet Take 1 tablet (15 mg total) by mouth daily with supper. 30 tablet 3  . senna (SENOKOT) 8.6 MG tablet Take 1 tablet by  mouth at bedtime.     [name] spironolactone (ALDACTONE) 25 MG tablet Take 1 tablet (25 mg total) by mouth daily. 30 tablet 6  . torsemide (DEMADEX) 20 MG tablet Take 3 tablets (60 mg total) by mouth daily. 270 tablet 3  . Melatonin 10 MG TABS Take 10 mg by mouth at bedtime.     No current facility-administered medications for this encounter.    Filed Vitals:   04/30/14 0829  BP: 112/68  Pulse: 60  Weight: 238 lb 8 oz (108.183 kg)  SpO2: 96%   General: NAD Wife present  Neck: Thick, JVP 7 cm. No thyromegaly or thyroid nodule.  Lungs: Clear to auscultation bilaterally with normal respiratory effort.  CV: Nondisplaced PMI.  Heart regular S1/S2, no S3/S4, no murmur.  No edema.  No carotid bruit.  Normal pedal pulses.  Abdomen: Obese, Soft, tender to palpation, obese, no hepatosplenomegaly, non-distended Skin: Intact without lesions or rashes.  Neurologic: Alert and oriented x 3.  Psych: Flat affect. Extremities: No clubbing or cyanosis.   Assessment/plan: 1. Chronic systolic CHF: Cardiomyopathy x years.  Thought to be nonischemic but Cardiolite in 12/15 showed apical scar (no ischemia).  EF has been stable at 25-30% (04/2013). Has St Jude ICD, narrow QRS so has not been CRT candidate.  However, now that he is in persistent atrial fibrillation, he is doing a significant amount of RV pacing. CPX (4/15) VO2 low but slope ok.  NYHA class III symptoms, seems a bit improved.  He does not appear volume overloaded on exam.  - Continue current torsemide, 60 mg daily.  - Continue digoxin give low output on RHC.  Check digoxin level today.  - Continue Toprol XL 50 mg twice a day.  - Continue 25 mg daily spironolactone. - Continue Entresto 24/26 mg twice a day.  - If we cannot keep him in NSR, given heavy burden of RV pacing would consider BiV upgrade (+/- AV nodal ablation).  He will follow with Dr. Rayann Heman. - We have discussed the fact that he may be nearing the need for advanced therapies.  Weight will  be an issue for transplant.  He may need LVAD in the near future => I am going to get repeat CPX and RHC next week (after cardioverting this week).   - Reinforced the need and importance of daily weights, a low sodium diet, and fluid restriction (less than 2 L a day). Instructed to call the HF clinic if weight increases more than 3 lbs overnight or 5 lbs in a week.  2. CKD: BMET today.  3. Atrial fibrillation: Persistent.  He has not missed Xarelto doses.  He is on amiodarone 200 mg bid.  Will check LFTs and TSH today, he will need regular eye exams.  Will get CBC today as well.  I will arrange for DCCV later this week.  If this fails, will need to consider CRT upgrade. 4. Obesity:  Needs to lose weight.  Asked him to cut back on his portions and try low carb diet.   5. DM2: Continue to follow with endocrinology.  Glucose control still not ideal.  6. Depression/anxiety: Seems to be ongoing issue.  Doing better now that he is seeing a psychologist.   7. Fatigue: In addition to CHF, I think that OSA (that is untreated as he has had a hard time with CPAP) and depression play a large role. He is still working on getting a CPAP mask that works.   8. CAD: Though to have nonischemic CMP, but Cardiolite showed large apical scar.  Given no ischemia or chest pain, would not do cardiac cath at this time given CKD.  Continue statin.  Not on ASA given Xarelto use.   Follow up 2 weeks  Loralie Champagne  04/30/2014

## 2014-05-02 NOTE — Anesthesia Postprocedure Evaluation (Signed)
Anesthesia Post Note  Patient: Larry [name]  Procedure(s) Performed: Procedure(s) (LRB): CARDIOVERSION (N/A)  Anesthesia type: General/mask  Patient location: PACU  Post pain: Pain level controlled  Post assessment: Post-op Vital signs reviewed  Last Vitals: BP 150/74 mmHg  Pulse 59  Temp(Src) 36.6 C (Oral)  Resp 17  SpO2 97%  Post vital signs: Reviewed  Level of consciousness: sedated  Complications: No apparent anesthesia complications

## 2014-05-02 NOTE — Transfer of Care (Signed)
Immediate Anesthesia Transfer of Care Note  Patient: Larry [name]  Procedure(s) Performed: Procedure(s): CARDIOVERSION (N/A)  Patient Location: Endoscopy Unit  Anesthesia Type:MAC  Level of Consciousness: awake, alert , oriented and patient cooperative  Airway & Oxygen Therapy: Patient Spontanous Breathing and Patient connected to nasal cannula oxygen  Post-op Assessment: Report given to RN and Post -op Vital signs reviewed and stable  Post vital signs: Reviewed  Last Vitals:  Filed Vitals:   05/02/14 1201  BP:   Pulse:   Temp:   Resp: 18    Complications: No apparent anesthesia complications

## 2014-05-02 NOTE — CV Procedure (Signed)
     DIRECT CURRENT CARDIOVERSION  NAME:  Larry [name]   MRN: [id number] DOB:  06/13/1950   ADMIT DATE: 05/02/2014   INDICATIONS: Atrial fibrillation    PROCEDURE:   Informed consent was obtained prior to the procedure. The risks, benefits and alternatives for the procedure were discussed and the patient comprehended these risks. Once an appropriate time out was taken, the patient had the defibrillator pads placed in the anterior and posterior position. The patient then underwent sedation by the anesthesia service. Once an appropriate level of sedation was achieved, the patient received a single biphasic, synchronized 200J shock with prompt conversion to sinus rhythm with AV pacing. No apparent complications.   CONLCUSION:   1. Successful DC-CV of AF.   Avacyn Kloosterman,MD 12:00 PM

## 2014-05-02 NOTE — Interval H&P Note (Signed)
History and Physical Interval Note:  05/02/2014 11:42 AM  Larry [name]  has presented today for surgery, with the diagnosis of AFIB  The various methods of treatment have been discussed with the patient and family. After consideration of risks, benefits and other options for treatment, the patient has consented to  Procedure(s): CARDIOVERSION (N/A) as a surgical intervention .  The patient's history has been reviewed, patient examined, no change in status, stable for surgery.  I have reviewed the patient's chart and labs.  Questions were answered to the patient's satisfaction.     Daniel Bensimhon

## 2014-05-02 NOTE — Anesthesia Procedure Notes (Signed)
Procedure Name: MAC Date/Time: 05/02/2014 11:53 AM Performed by: Jenne Campus Pre-anesthesia Checklist: Patient identified, Emergency Drugs available, Suction available, Patient being monitored and Timeout performed Patient Re-evaluated:Patient Re-evaluated prior to inductionOxygen Delivery Method: Ambu bag

## 2014-05-02 NOTE — Discharge Instructions (Signed)

## 2014-05-06 ENCOUNTER — Encounter (HOSPITAL_COMMUNITY): Payer: Self-pay | Admitting: Internal Medicine

## 2014-05-08 ENCOUNTER — Encounter (HOSPITAL_COMMUNITY)
Admission: RE | Disposition: A | Discharge status: Home / Self Care | Payer: Self-pay | Source: Ambulatory Visit | Attending: Cardiology

## 2014-05-08 ENCOUNTER — Encounter (HOSPITAL_COMMUNITY): Payer: Self-pay | Admitting: Cardiology

## 2014-05-08 ENCOUNTER — Ambulatory Visit (HOSPITAL_COMMUNITY)
Admission: RE | Admit: 2014-05-08 | Discharge: 2014-05-08 | Disposition: A | Discharge status: Home / Self Care | Payer: 59 | Source: Ambulatory Visit | Attending: Cardiology | Admitting: Cardiology

## 2014-05-08 DIAGNOSIS — I5022 Chronic systolic (congestive) heart failure: Secondary | ICD-10-CM | POA: Diagnosis not present

## 2014-05-08 DIAGNOSIS — I481 Persistent atrial fibrillation: Secondary | ICD-10-CM | POA: Insufficient documentation

## 2014-05-08 DIAGNOSIS — E119 Type 2 diabetes mellitus without complications: Secondary | ICD-10-CM | POA: Diagnosis not present

## 2014-05-08 DIAGNOSIS — I129 Hypertensive chronic kidney disease with stage 1 through stage 4 chronic kidney disease, or unspecified chronic kidney disease: Secondary | ICD-10-CM | POA: Insufficient documentation

## 2014-05-08 DIAGNOSIS — E039 Hypothyroidism, unspecified: Secondary | ICD-10-CM | POA: Insufficient documentation

## 2014-05-08 DIAGNOSIS — F329 Major depressive disorder, single episode, unspecified: Secondary | ICD-10-CM | POA: Diagnosis not present

## 2014-05-08 DIAGNOSIS — Z6833 Body mass index (BMI) 33.0-33.9, adult: Secondary | ICD-10-CM | POA: Insufficient documentation

## 2014-05-08 DIAGNOSIS — Z794 Long term (current) use of insulin: Secondary | ICD-10-CM | POA: Diagnosis not present

## 2014-05-08 DIAGNOSIS — F419 Anxiety disorder, unspecified: Secondary | ICD-10-CM | POA: Insufficient documentation

## 2014-05-08 DIAGNOSIS — Z87891 Personal history of nicotine dependence: Secondary | ICD-10-CM | POA: Insufficient documentation

## 2014-05-08 DIAGNOSIS — Z8673 Personal history of transient ischemic attack (TIA), and cerebral infarction without residual deficits: Secondary | ICD-10-CM | POA: Diagnosis not present

## 2014-05-08 DIAGNOSIS — I509 Heart failure, unspecified: Secondary | ICD-10-CM | POA: Diagnosis not present

## 2014-05-08 DIAGNOSIS — G4733 Obstructive sleep apnea (adult) (pediatric): Secondary | ICD-10-CM | POA: Insufficient documentation

## 2014-05-08 DIAGNOSIS — I4891 Unspecified atrial fibrillation: Secondary | ICD-10-CM | POA: Diagnosis present

## 2014-05-08 DIAGNOSIS — E785 Hyperlipidemia, unspecified: Secondary | ICD-10-CM | POA: Insufficient documentation

## 2014-05-08 DIAGNOSIS — I429 Cardiomyopathy, unspecified: Secondary | ICD-10-CM | POA: Insufficient documentation

## 2014-05-08 DIAGNOSIS — E669 Obesity, unspecified: Secondary | ICD-10-CM | POA: Diagnosis not present

## 2014-05-08 DIAGNOSIS — I2510 Atherosclerotic heart disease of native coronary artery without angina pectoris: Secondary | ICD-10-CM | POA: Diagnosis not present

## 2014-05-08 DIAGNOSIS — N189 Chronic kidney disease, unspecified: Secondary | ICD-10-CM | POA: Diagnosis not present

## 2014-05-08 HISTORY — PX: RIGHT HEART CATHETERIZATION: SHX5447

## 2014-05-08 LAB — POCT I-STAT 3, VENOUS BLOOD GAS (G3P V)
ACID-BASE EXCESS: 1 mmol/L (ref 0.0–2.0)
Acid-Base Excess: 2 mmol/L (ref 0.0–2.0)
BICARBONATE: 26.3 meq/L — AB (ref 20.0–24.0)
Bicarbonate: 26.8 mEq/L — ABNORMAL HIGH (ref 20.0–24.0)
O2 SAT: 55 %
O2 SAT: 56 %
PH VEN: 7.391 — AB (ref 7.250–7.300)
PO2 VEN: 30 mmHg (ref 30.0–45.0)
TCO2: 28 mmol/L (ref 0–100)
TCO2: 28 mmol/L (ref 0–100)
pCO2, Ven: 43.4 mmHg — ABNORMAL LOW (ref 45.0–50.0)
pCO2, Ven: 43.5 mmHg — ABNORMAL LOW (ref 45.0–50.0)
pH, Ven: 7.398 — ABNORMAL HIGH (ref 7.250–7.300)
pO2, Ven: 29 mmHg — CL (ref 30.0–45.0)

## 2014-05-08 LAB — BASIC METABOLIC PANEL
ANION GAP: 13 (ref 5–15)
BUN: 36 mg/dL — ABNORMAL HIGH (ref 6–23)
CHLORIDE: 99 mmol/L (ref 96–112)
CO2: 26 mmol/L (ref 19–32)
CREATININE: 2.06 mg/dL — AB (ref 0.50–1.35)
Calcium: 9.2 mg/dL (ref 8.4–10.5)
GFR calc Af Amer: 38 mL/min — ABNORMAL LOW (ref >90)
GFR, EST NON AFRICAN AMERICAN: 33 mL/min — AB (ref >90)
GLUCOSE: 179 mg/dL — AB (ref 70–99)
Potassium: 4.6 mmol/L (ref 3.5–5.1)
Sodium: 138 mmol/L (ref 135–145)

## 2014-05-08 LAB — PROTIME-INR
INR: 2.08 — AB (ref 0.00–1.49)
Prothrombin Time: 23.6 seconds — ABNORMAL HIGH (ref 11.6–15.2)

## 2014-05-08 LAB — GLUCOSE, CAPILLARY
GLUCOSE-CAPILLARY: 166 mg/dL — AB (ref 70–99)
Glucose-Capillary: 176 mg/dL — ABNORMAL HIGH (ref 70–99)

## 2014-05-08 SURGERY — RIGHT HEART CATH
Anesthesia: LOCAL

## 2014-05-08 MED ORDER — INSULIN ASPART 100 UNIT/ML ~~LOC~~ SOLN
0.0000 [IU] | SUBCUTANEOUS | Status: DC
  Administered 2014-05-08: 2 [IU] via SUBCUTANEOUS

## 2014-05-08 MED ORDER — ONDANSETRON HCL 4 MG/2ML IJ SOLN: 4.0000 mg | Freq: Four times a day (QID) | INTRAMUSCULAR | Status: DC | PRN

## 2014-05-08 MED ORDER — SODIUM CHLORIDE 0.9 % IV SOLN: 250.0000 mL | INTRAVENOUS | Status: DC | PRN

## 2014-05-08 MED ORDER — SODIUM CHLORIDE 0.9 % IJ SOLN: 3.0000 mL | INTRAMUSCULAR | Status: DC | PRN

## 2014-05-08 MED ORDER — FENTANYL CITRATE 0.05 MG/ML IJ SOLN
INTRAMUSCULAR | Status: AC
  Filled 2014-05-08: qty 2

## 2014-05-08 MED ORDER — SODIUM CHLORIDE 0.9 % IJ SOLN: 3.0000 mL | Freq: Two times a day (BID) | INTRAMUSCULAR | Status: DC

## 2014-05-08 MED ORDER — MIDAZOLAM HCL 2 MG/2ML IJ SOLN
INTRAMUSCULAR | Status: AC
  Filled 2014-05-08: qty 2

## 2014-05-08 MED ORDER — SODIUM CHLORIDE 0.9 % IJ SOLN
3.0000 mL | Freq: Two times a day (BID) | INTRAMUSCULAR | Status: DC
Start: 2014-05-08 — End: 2014-05-08

## 2014-05-08 MED ORDER — HEPARIN (PORCINE) IN NACL 2-0.9 UNIT/ML-% IJ SOLN
INTRAMUSCULAR | Status: AC
  Filled 2014-05-08: qty 1000

## 2014-05-08 MED ORDER — LIDOCAINE HCL (PF) 1 % IJ SOLN
INTRAMUSCULAR | Status: AC
  Filled 2014-05-08: qty 30

## 2014-05-08 MED ORDER — SODIUM CHLORIDE 0.9 % IV SOLN: INTRAVENOUS | Status: DC

## 2014-05-08 MED ORDER — ACETAMINOPHEN 325 MG PO TABS: 650.0000 mg | ORAL_TABLET | ORAL | Status: DC | PRN

## 2014-05-08 NOTE — CV Procedure (Signed)
    Cardiac Catheterization Procedure Note  Name: Larry [name] MRN: [id number] DOB: 03-02-1950  Procedure: Right Heart Cath  Indication: CHF   Procedural Details: The right brachial area was prepped, draped, and anesthetized with 1% lidocaine. There was a pre-existing peripheral IV in the right brachial area.  This was replaced with a 5 [place] sheath. A Swan-Ganz catheter was used for the right heart catheterization. Standard protocol was followed for recording of right heart pressures and sampling of oxygen saturations. Fick cardiac output was calculated. There were no immediate procedural complications. The patient was transferred to the post catheterization recovery area for further monitoring.  Procedural Findings: Hemodynamics (mmHg) RA mean 7 RV 80/12 PA 76/27, mean 43 PCWP mean 25  Oxygen saturations: PA 56% AO 93%  Cardiac Output (Fick) 3.89  Cardiac Index (Fick) 1.75  PVR 4.62  Final Conclusions:  Elevated left heart filling pressure with severe pulmonary hypertension.  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  I suspect that severe untreated OSA is playing a role.  Cardiac index is low, last RHC 1 year ago had index 1.8 so not much different.   Recommendations: We need to consider [name] for LVAD.  He is set up for CPX.  I am not going to start him on milrinone at this point, cardiac index is similar to prior 1 year ago and he is now holding NSR.  RV-pacing with atrial fibrillation has been a problem, and I am concerned he would be back in atrial fibrillation on milrinone.   - I will begin LVAD workup.  - Increase torsemide to 80 mg daily.  - Has repeat sleep study this week for CPAP titration.  This will be very important given elevated PA pressure and severe OSA.   Loralie Champagne 05/08/2014, 9:34 AM

## 2014-05-08 NOTE — Interval H&P Note (Signed)
History and Physical Interval Note:  05/08/2014 9:02 AM  Larry [name]  has presented today for surgery, with the diagnosis of heart failure   The various methods of treatment have been discussed with the patient and family. After consideration of risks, benefits and other options for treatment, the patient has consented to  Procedure(s): RIGHT HEART CATH (N/A) as a surgical intervention .  The patient's history has been reviewed, patient examined, no change in status, stable for surgery.  I have reviewed the patient's chart and labs.  Questions were answered to the patient's satisfaction.     Larry [name] [employer]

## 2014-05-08 NOTE — Progress Notes (Signed)
Site area: right brachial a 5 french venous brachial sheath was removed  Site Prior to Removal:  Level 0  Pressure Applied For 15 MINUTES    Minutes Beginning at 0945a  Manual:   Yes.    Patient Status During Pull:  stable  Post Pull Groin Site:  Level 0  Post Pull Instructions Given:  Yes.    Post Pull Pulses Present:  Yes.    Dressing Applied:  Yes.    Comments:  VS remain stable during sheath pull.  Pt denies any discomfort at site.

## 2014-05-08 NOTE — H&P (View-Only) (Signed)
Patient ID: Larry [name], male   DOB: 1950/03/05, 64 y.o.   MRN: [id number] PCP: Dr. Rex Kras Endocrinologist: Dr Buddy Duty  64 yo with history of chronic systolic CHF (nonischemic cardiomyopathy), CKD, prior CVA, and paroxysmal atrial fibrillation.  Patient says that he has been known to have a low EF for years.  He has a Research officer, political party ICD.  He had LHC in 2009 with nonobstructive disease. Last echo in 3/15 showed moderate LV dilation with EF 25-30%.    Patient had [place] 8/15 showing low cardiac output, mildly elevated PCWP and primarily pulmonary venous hypertension.  He was transitioned from Lasix to torsemide and started on digoxin.  At a prior appointment, [name] was started and valsartan stopped.   Adenosine Cardiolite was done in 12/15 showing large area of apical scar but no ischemia.  This raised concern for possible ischemic cardiomyopathy.  He was admitted later in 12/15 with acute on chronic systolic CHF.  He also was noted to have gone into atrial fibrillation during this hospitalization, amiodarone was started in preparation for possible DCCV.  Troponin was mildly elevated in the hospital in the absence of chest pain, likely demand ischemia.  He was diuresed with IV Lasix and was discharged soon after.  He was cardioverted back to NSR on 02/14/14.   He was back in atrial fibrillation at last appointment.  Today, he is in atrial fibrillation as well with some RV pacing.  He is still working on getting on CPAP, waiting for a new mask.  Generally feels a bit better symptomatically.  Weight is up 3 lbs but can walk about 500 feet before becoming short of breath.  Short of breath going up inclines.  No orthopnea/PND.  Mood is better.  No chest pain.    ECG: atrial fibrillation with RV pacing  Labs (2/15): LDL 103 Labs (3/15): K 4.1, creatinine 1.5, BNP 164 Labs (4/15): K 3.5, creatinine 3.3 => 1.9 => 2.1, BNP 96 Labs (4/15): K 4.3, creatinine 1.43, glucose 308 Labs (5/15): K 5.1 Creatinine 1.35  Labs  (8/15): K 5 => 4.7 => 3.8, creatinine 1.19 => 1.57 => 1.6, digoxin 1.1 Labs (9/15): K 4.6, creatinine 1.32 => 1.9 => 1.1, digoxin 0.6 => 1.2 (not trough) Labs (10/15): K 4.5, creatinine 1.36, pro-BNP 1261 Labs (12/15): TSH 8.6 (elevated), free T3/free T4 normal, K 4.5, creatinine 1.71, digoxin 0.6, HCT 44.1, AST 38, ALT 44, TSH normal Labs (1/16): K 5.1, creatinine 1.96, LFTs normal, BNP 952  PMH: 1. Chronic systolic CHF: ? Nonischemic cardiomyopathy.  Most recent echo in 3/15 with EF 25-30%, moderate LV dilation, mild LVH, moderate diastolic dysfunction, IVC normal, mildly decreased RV systolic function.  Prior echo in 2011 also with EF 25-30%.  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  St Jude ICD. CPX 05/2013: FVC 3.89 (82%), FEV1 2.99 (83%), FEV1/FVC 77%, Peak HR 118 (74% predicted max), Peak VO2 12.4 (51% of predicted; when corrected to IBW =17.7), VE/VCO2 29.9, OUES 1.39, Peak RER 1.21.  RHC (8/15) with mean RA 6, PA 60/21 mean 36, mean PCWP 21, CI 1.81, PVR 3.6 WU.  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   2. Type II diabetes 3. CKD 4. Monoclonal gammopathy of uncertain etiology.  5. CVA 2007 6. Degenerative disc disease.  7. Pituitary microadenoma: Nonfunctioning, s/p surgery in 2009.  8. OSA: Unable to tolerate Bipap/CPAP. 9. Depression 10. Hypothyroidism 11. Hyperlipidemia 12. Atrial fibrillation:   Patient  is on Xarelto. DCCV 02/14/14 to NSR.  13. 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   FH: Father with MI x 2 and CHF, strong history of CAD on his father's side.   ROS: All systems reviewed and negative except as per HPI.   Current Outpatient Prescriptions  Medication Sig Dispense Refill  . ALPRAZolam (XANAX) 0.5 MG tablet Take 0.5 mg by mouth 3 (three) times daily as needed for anxiety or sleep.    [name] amiodarone (PACERONE) 200 MG tablet Take 1  tablet (200 mg total) by mouth 2 (two) times daily. 60 tablet 3  . atorvastatin (LIPITOR) 40 MG tablet TAKE 1 TABLET DAILY (Patient taking differently: TAKE 1 TABLET BY MOUTH DAILY) 90 tablet 3  . brimonidine (ALPHAGAN) 0.2 % ophthalmic solution Place 1 drop into the left eye 2 (two) times daily. 48 hours prior to surgery  1  . digoxin (LANOXIN) 0.125 MG tablet TAKE 1 TABLET DAILY (Patient taking differently: TAKE 1 TABLET BY MOUTH DAILY) 90 tablet 3  . DULoxetine (CYMBALTA) 30 MG capsule Take 60 mg by mouth 2 (two) times daily.     [name] ENTRESTO 24-26 MG Take 1 tablet by mouth 2 (two) times daily.  6  . GLUCAGON EMERGENCY 1 MG injection Inject 1 mg as directed once. For hypotension    . insulin aspart (NOVOLOG) 100 UNIT/ML injection Inject 24 Units into the skin 3 (three) times daily before meals.     [name] ketorolac (ACULAR) 0.5 % ophthalmic solution Place 1 drop into the left eye 2 (two) times daily. Start 1 week prior to surgery  4  . LANTUS SOLOSTAR 100 UNIT/ML Solostar Pen Inject 36 Units into the skin at bedtime.     [name] levothyroxine (SYNTHROID, LEVOTHROID) 25 MCG tablet Take 50 mcg by mouth daily before breakfast.   5  . metoprolol succinate (TOPROL-XL) 100 MG 24 hr tablet Take 50 mg by mouth in the am and take 50 mg by mouth in the pm    . Multiple Vitamin (MULITIVITAMIN WITH MINERALS) TABS Take 1 tablet by mouth daily.    [name] ofloxacin (OCUFLOX) 0.3 % ophthalmic solution Place 1 drop into both eyes 2 (two) times daily. Start 72 hours prior to surgery  1  . oxyCODONE-acetaminophen (PERCOCET) 10-325 MG per tablet Take 1 tablet by mouth every 4 (four) hours as needed for pain. For pain    . prednisoLONE acetate (PRED FORTE) 1 % ophthalmic suspension Place 1 drop into the left eye 2 (two) times daily. Four times a day. Start after surgery  1  . rivaroxaban (XARELTO) 15 MG TABS tablet Take 1 tablet (15 mg total) by mouth daily with supper. 30 tablet 3  . senna (SENOKOT) 8.6 MG tablet Take 1 tablet by  mouth at bedtime.     [name] spironolactone (ALDACTONE) 25 MG tablet Take 1 tablet (25 mg total) by mouth daily. 30 tablet 6  . torsemide (DEMADEX) 20 MG tablet Take 3 tablets (60 mg total) by mouth daily. 270 tablet 3  . Melatonin 10 MG TABS Take 10 mg by mouth at bedtime.     No current facility-administered medications for this encounter.    Filed Vitals:   04/30/14 0829  BP: 112/68  Pulse: 60  Weight: 238 lb 8 oz (108.183 kg)  SpO2: 96%   General: NAD Wife present  Neck: Thick, JVP 7 cm. No thyromegaly or thyroid nodule.  Lungs: Clear to auscultation bilaterally with normal respiratory effort.  CV: Nondisplaced PMI.  Heart regular S1/S2, no S3/S4, no murmur.  No edema.  No carotid bruit.  Normal pedal pulses.  Abdomen: Obese, Soft, tender to palpation, obese, no hepatosplenomegaly, non-distended Skin: Intact without lesions or rashes.  Neurologic: Alert and oriented x 3.  Psych: Flat affect. Extremities: No clubbing or cyanosis.   Assessment/plan: 1. Chronic systolic CHF: Cardiomyopathy x years.  Thought to be nonischemic but Cardiolite in 12/15 showed apical scar (no ischemia).  EF has been stable at 25-30% (04/2013). Has St Jude ICD, narrow QRS so has not been CRT candidate.  However, now that he is in persistent atrial fibrillation, he is doing a significant amount of RV pacing. CPX (4/15) VO2 low but slope ok.  NYHA class III symptoms, seems a bit improved.  He does not appear volume overloaded on exam.  - Continue current torsemide, 60 mg daily.  - Continue digoxin give low output on RHC.  Check digoxin level today.  - Continue Toprol XL 50 mg twice a day.  - Continue 25 mg daily spironolactone. - Continue Entresto 24/26 mg twice a day.  - If we cannot keep him in NSR, given heavy burden of RV pacing would consider BiV upgrade (+/- AV nodal ablation).  He will follow with Dr. Rayann Heman. - We have discussed the fact that he may be nearing the need for advanced therapies.  Weight will  be an issue for transplant.  He may need LVAD in the near future => I am going to get repeat CPX and RHC next week (after cardioverting this week).   - Reinforced the need and importance of daily weights, a low sodium diet, and fluid restriction (less than 2 L a day). Instructed to call the HF clinic if weight increases more than 3 lbs overnight or 5 lbs in a week.  2. CKD: BMET today.  3. Atrial fibrillation: Persistent.  He has not missed Xarelto doses.  He is on amiodarone 200 mg bid.  Will check LFTs and TSH today, he will need regular eye exams.  Will get CBC today as well.  I will arrange for DCCV later this week.  If this fails, will need to consider CRT upgrade. 4. Obesity:  Needs to lose weight.  Asked him to cut back on his portions and try low carb diet.   5. DM2: Continue to follow with endocrinology.  Glucose control still not ideal.  6. Depression/anxiety: Seems to be ongoing issue.  Doing better now that he is seeing a psychologist.   7. Fatigue: In addition to CHF, I think that OSA (that is untreated as he has had a hard time with CPAP) and depression play a large role. He is still working on getting a CPAP mask that works.   8. CAD: Though to have nonischemic CMP, but Cardiolite showed large apical scar.  Given no ischemia or chest pain, would not do cardiac cath at this time given CKD.  Continue statin.  Not on ASA given Xarelto use.   Follow up 2 weeks  Loralie Champagne  04/30/2014

## 2014-05-08 NOTE — Discharge Instructions (Signed)
CALL DR MCLEAN'S OFFICE IF ANY PROBLEMS,QUESTIONS OR CONCERNS; CALL IF ANY BLEEDING,DRAINAGE,FEVER,PAIN,SWELLING OR REDNESS RIGHT BRACHIAL SITE

## 2014-05-09 ENCOUNTER — Telehealth: Payer: Self-pay | Admitting: Internal Medicine

## 2014-05-09 NOTE — Telephone Encounter (Signed)
Called pt to attempt to r/s appt he has on 06/04/14 @ 11am.  left message.

## 2014-05-16 ENCOUNTER — Encounter: Payer: Self-pay | Admitting: Cardiology

## 2014-05-17 ENCOUNTER — Telehealth (HOSPITAL_COMMUNITY): Payer: Self-pay | Admitting: Infectious Diseases

## 2014-05-17 ENCOUNTER — Ambulatory Visit (HOSPITAL_COMMUNITY): Payer: 59 | Attending: Family Medicine

## 2014-05-17 ENCOUNTER — Telehealth: Payer: Self-pay | Admitting: Internal Medicine

## 2014-05-17 DIAGNOSIS — I5022 Chronic systolic (congestive) heart failure: Secondary | ICD-10-CM | POA: Diagnosis not present

## 2014-05-17 NOTE — Telephone Encounter (Signed)
Called patient to apologize for not being able to meet with him this afternoon after CPX to discuss VAD therapy. Requested a call back to reschedule.

## 2014-05-17 NOTE — Telephone Encounter (Signed)
LEFT MESSAGE FOR PATIENT TO RETURN CALL TO RESCHEDULE NP APPT DUE TO MD ON-CALL CONTACT INFORMATION WAS LEFT.

## 2014-05-22 ENCOUNTER — Ambulatory Visit (HOSPITAL_COMMUNITY)
Admission: RE | Admit: 2014-05-22 | Discharge: 2014-05-22 | Disposition: A | Discharge status: Home / Self Care | Payer: 59 | Source: Ambulatory Visit | Attending: Internal Medicine | Admitting: Internal Medicine

## 2014-05-22 ENCOUNTER — Telehealth (HOSPITAL_COMMUNITY): Payer: Self-pay | Admitting: *Deleted

## 2014-05-22 ENCOUNTER — Encounter (HOSPITAL_COMMUNITY): Payer: 59

## 2014-05-22 ENCOUNTER — Encounter (HOSPITAL_COMMUNITY): Payer: Self-pay

## 2014-05-22 ENCOUNTER — Ambulatory Visit (HOSPITAL_BASED_OUTPATIENT_CLINIC_OR_DEPARTMENT_OTHER): Payer: 59 | Admitting: Radiology

## 2014-05-22 VITALS — Ht 70.5 in | Wt 240.0 lb

## 2014-05-22 VITALS — BP 110/62 | HR 61 | Wt 240.5 lb

## 2014-05-22 DIAGNOSIS — E119 Type 2 diabetes mellitus without complications: Secondary | ICD-10-CM | POA: Diagnosis not present

## 2014-05-22 DIAGNOSIS — I5022 Chronic systolic (congestive) heart failure: Secondary | ICD-10-CM | POA: Diagnosis not present

## 2014-05-22 DIAGNOSIS — G4733 Obstructive sleep apnea (adult) (pediatric): Secondary | ICD-10-CM

## 2014-05-22 DIAGNOSIS — I480 Paroxysmal atrial fibrillation: Secondary | ICD-10-CM | POA: Diagnosis not present

## 2014-05-22 DIAGNOSIS — Z7902 Long term (current) use of antithrombotics/antiplatelets: Secondary | ICD-10-CM | POA: Diagnosis not present

## 2014-05-22 DIAGNOSIS — Z8249 Family history of ischemic heart disease and other diseases of the circulatory system: Secondary | ICD-10-CM | POA: Diagnosis not present

## 2014-05-22 DIAGNOSIS — I429 Cardiomyopathy, unspecified: Secondary | ICD-10-CM | POA: Diagnosis not present

## 2014-05-22 DIAGNOSIS — E785 Hyperlipidemia, unspecified: Secondary | ICD-10-CM | POA: Diagnosis not present

## 2014-05-22 DIAGNOSIS — N183 Chronic kidney disease, stage 3 unspecified: Secondary | ICD-10-CM

## 2014-05-22 DIAGNOSIS — I2510 Atherosclerotic heart disease of native coronary artery without angina pectoris: Secondary | ICD-10-CM | POA: Diagnosis not present

## 2014-05-22 DIAGNOSIS — Z8673 Personal history of transient ischemic attack (TIA), and cerebral infarction without residual deficits: Secondary | ICD-10-CM | POA: Insufficient documentation

## 2014-05-22 DIAGNOSIS — R079 Chest pain, unspecified: Secondary | ICD-10-CM

## 2014-05-22 DIAGNOSIS — Z6834 Body mass index (BMI) 34.0-34.9, adult: Secondary | ICD-10-CM | POA: Diagnosis not present

## 2014-05-22 DIAGNOSIS — E669 Obesity, unspecified: Secondary | ICD-10-CM | POA: Diagnosis not present

## 2014-05-22 DIAGNOSIS — I481 Persistent atrial fibrillation: Secondary | ICD-10-CM | POA: Diagnosis not present

## 2014-05-22 DIAGNOSIS — D472 Monoclonal gammopathy: Secondary | ICD-10-CM | POA: Diagnosis not present

## 2014-05-22 DIAGNOSIS — E039 Hypothyroidism, unspecified: Secondary | ICD-10-CM | POA: Insufficient documentation

## 2014-05-22 DIAGNOSIS — R0789 Other chest pain: Secondary | ICD-10-CM

## 2014-05-22 DIAGNOSIS — I4819 Other persistent atrial fibrillation: Secondary | ICD-10-CM

## 2014-05-22 DIAGNOSIS — N189 Chronic kidney disease, unspecified: Secondary | ICD-10-CM | POA: Diagnosis not present

## 2014-05-22 LAB — BASIC METABOLIC PANEL
ANION GAP: 6 (ref 5–15)
BUN: 43 mg/dL — ABNORMAL HIGH (ref 6–23)
CO2: 30 mmol/L (ref 19–32)
CREATININE: 2.28 mg/dL — AB (ref 0.50–1.35)
Calcium: 9.1 mg/dL (ref 8.4–10.5)
Chloride: 100 mmol/L (ref 96–112)
GFR calc non Af Amer: 29 mL/min — ABNORMAL LOW (ref >90)
GFR, EST AFRICAN AMERICAN: 34 mL/min — AB (ref >90)
Glucose, Bld: 118 mg/dL — ABNORMAL HIGH (ref 70–99)
Potassium: 4.3 mmol/L (ref 3.5–5.1)
Sodium: 136 mmol/L (ref 135–145)

## 2014-05-22 LAB — DIGOXIN LEVEL: Digoxin Level: 0.5 ng/mL — ABNORMAL LOW (ref 0.8–2.0)

## 2014-05-22 LAB — BRAIN NATRIURETIC PEPTIDE: B NATRIURETIC PEPTIDE 5: 935.2 pg/mL — AB (ref 0.0–100.0)

## 2014-05-22 MED ORDER — ISOSORBIDE MONONITRATE ER 30 MG PO TB24: 30.0000 mg | ORAL_TABLET | Freq: Every day | ORAL | Status: DC

## 2014-05-22 MED ORDER — HYDRALAZINE HCL 25 MG PO TABS: 25.0000 mg | ORAL_TABLET | Freq: Three times a day (TID) | ORAL | Status: DC

## 2014-05-22 MED ORDER — METOPROLOL SUCCINATE ER 50 MG PO TB24: 50.0000 mg | ORAL_TABLET | Freq: Every day | ORAL | Status: DC

## 2014-05-22 NOTE — Patient Instructions (Signed)
YOUR provider has requested you be admitted to [facility] Monday 05/27/2014.  DECREASE Toprol to 50 mg daily.  LABS today. (digoxin, bmet,bnp)

## 2014-05-22 NOTE — Telephone Encounter (Signed)
-----   Message from [name], MD sent at 05/22/2014  4:50 PM EDT ----- Digoxin level ok, creatinine higher.  For now, let's have him stop Entresto and start on hydralazine 25 mg tid + Imdur 30 daily.  Please call today.

## 2014-05-22 NOTE — Progress Notes (Signed)
Patient ID: Larry [name], male   DOB: September 03, 1950, 64 y.o.   MRN: [id number] PCP: Dr. Rex Kras Endocrinologist: Dr Buddy Duty  64 yo with history of chronic systolic CHF (nonischemic cardiomyopathy), CKD, prior CVA, and paroxysmal atrial fibrillation.  Patient says that he has been known to have a low EF for years.  He has a Research officer, political party ICD.  He had LHC in 2009 with nonobstructive disease. Last echo in 12/15 showed EF 20-25% with normal RV size and systolic function.     Patient had [place] 8/15 showing low cardiac output, mildly elevated PCWP and primarily pulmonary venous hypertension.  He was transitioned from Lasix to torsemide and started on digoxin.  Entresto was started and valsartan stopped.   Adenosine Cardiolite was done in 12/15 showing large area of apical scar but no ischemia.  This raised concern for possible ischemic cardiomyopathy.  He was admitted later in 12/15 with acute on chronic systolic CHF.  He also was noted to have gone into atrial fibrillation during this hospitalization, amiodarone was started in preparation for possible DCCV.  Troponin was mildly elevated in the hospital in the absence of chest pain, likely demand ischemia.  He was diuresed with IV Lasix and was discharged soon after.  He was cardioverted back to NSR on 02/14/14.  He was cardioverted again to NSR in 3/16.  He had RHC done 05/08/14 with mixed pulmonary hypertension and PWCP 25, CI 1.75.  CPX done 4/16 showed severe functional limitation.   He returns for followup today.  He is A-V sequential pacing.  Weight is up 2 lbs.  He feels about the same.  He is short of breath walking up steps, walking in the grocery store, and with walking about 1/2 block.  No orthopnea/PND.  No palpitations.  No lightheadedness/syncope.  No chest pain.  Digoxin recently decreased due to elevated serum level.   ECG: A-V sequential pacing  Labs (2/15): LDL 103 Labs (3/15): K 4.1, creatinine 1.5, BNP 164 Labs (4/15): K 3.5, creatinine 3.3 => 1.9 =>  2.1, BNP 96 Labs (4/15): K 4.3, creatinine 1.43, glucose 308 Labs (5/15): K 5.1 Creatinine 1.35  Labs (8/15): K 5 => 4.7 => 3.8, creatinine 1.19 => 1.57 => 1.6, digoxin 1.1 Labs (9/15): K 4.6, creatinine 1.32 => 1.9 => 1.1, digoxin 0.6 => 1.2 (not trough) Labs (10/15): K 4.5, creatinine 1.36, pro-BNP 1261 Labs (12/15): TSH 8.6 (elevated), free T3/free T4 normal, K 4.5, creatinine 1.71, digoxin 0.6, HCT 44.1, AST 38, ALT 44, TSH normal Labs (1/16): K 5.1, creatinine 1.96, LFTs normal, BNP 952 Labs (3/16): K 4.6, creatinine 2.06, LFTs normal, digoxin 1.8 (decreased digoxin to 0.0625)  PMH: 1. Chronic systolic CHF: ? Nonischemic cardiomyopathy.  Most recent echo in 3/15 with EF 25-30%, moderate LV dilation, mild LVH, moderate diastolic dysfunction, IVC normal, mildly decreased RV systolic function.  Prior echo in 2011 also with EF 25-30%.  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  St Jude ICD. CPX 05/2013: FVC 3.89 (82%), FEV1 2.99 (83%), FEV1/FVC 77%, Peak HR 118 (74% predicted max), Peak VO2 12.4 (51% of predicted; when corrected to IBW =17.7), VE/VCO2 29.9, OUES 1.39, Peak RER 1.21.  RHC (8/15) with mean RA 6, PA 60/21 mean 36, mean PCWP 21, CI 1.81, PVR 3.6 WU.  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  Echo (12/15) with EF 20-25%, inferior akinesis, normal RV size and systolic function.  RHC (3/16) with mean RA  7, PA 76/24 mean 43, mean PCWP 25, CI 1.75, PVR 4.62. CPX (4/16) with RER 0.99, peak VO2 7.1, slope 36 => severely decreased functional capacity though submaximal.  2. Type II diabetes 3. CKD 4. Monoclonal gammopathy of uncertain etiology.  5. CVA 2007 6. Degenerative disc disease.  7. Pituitary microadenoma: Nonfunctioning, s/p surgery in 2009.  8. OSA: Unable to tolerate Bipap/CPAP. 9. Depression 10. Hypothyroidism 11. Hyperlipidemia 12. Atrial fibrillation:   Patient is on Xarelto. DCCV  02/14/14 to NSR.  13. 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   FH: Father with MI x 2 and CHF, strong history of CAD on his father's side.   ROS: All systems reviewed and negative except as per HPI.   Current Outpatient Prescriptions  Medication Sig Dispense Refill  . ALPRAZolam (XANAX) 0.5 MG tablet Take 0.5 mg by mouth 3 (three) times daily as needed for anxiety or sleep.    [name] amiodarone (PACERONE) 200 MG tablet Take 1 tablet (200 mg total) by mouth 2 (two) times daily. 60 tablet 3  . atorvastatin (LIPITOR) 40 MG tablet TAKE 1 TABLET DAILY (Patient taking differently: TAKE 1 TABLET BY MOUTH DAILY) 90 tablet 3  . digoxin (LANOXIN) 0.125 MG tablet Take 0.5 tablets (0.0625 mg total) by mouth daily. 90 tablet 3  . DULoxetine (CYMBALTA) 30 MG capsule Take 60 mg by mouth 2 (two) times daily.     [name] ENTRESTO 24-26 MG Take 1 tablet by mouth 2 (two) times daily.  6  . GLUCAGON EMERGENCY 1 MG injection Inject 1 mg as directed once. For hypotension    . insulin aspart (NOVOLOG) 100 UNIT/ML injection Inject 24 Units into the skin 3 (three) times daily before meals.     [name] ketorolac (ACULAR) 0.5 % ophthalmic solution Place 1 drop into the left eye 2 (two) times daily. Start 1 week prior to surgery  4  . LANTUS SOLOSTAR 100 UNIT/ML Solostar Pen Inject 36 Units into the skin at bedtime.     [name] levothyroxine (SYNTHROID, LEVOTHROID) 25 MCG tablet Take 50 mcg by mouth daily before breakfast.   5  . Melatonin 10 MG TABS Take 10 mg by mouth at bedtime.    . metoprolol succinate (TOPROL-XL) 50 MG 24 hr tablet Take 1 tablet (50 mg total) by mouth daily. Take 50 mg by mouth in the am and take 50 mg by mouth in the pm 30 tablet 3  . Multiple Vitamin (MULITIVITAMIN WITH MINERALS) TABS Take 1 tablet by mouth daily.    [name] ofloxacin (OCUFLOX) 0.3 % ophthalmic solution Place 1 drop into both eyes 2 (two) times daily. Start 72 hours prior to surgery  1  . oxyCODONE-acetaminophen  (PERCOCET) 10-325 MG per tablet Take 1 tablet by mouth every 4 (four) hours as needed for pain. For pain    . prednisoLONE acetate (PRED FORTE) 1 % ophthalmic suspension Place 1 drop into the left eye 2 (two) times daily. Four times a day. Start after surgery  1  . rivaroxaban (XARELTO) 15 MG TABS tablet Take 1 tablet (15 mg total) by mouth daily with supper. 30 tablet 3  . senna (SENOKOT) 8.6 MG tablet Take 1 tablet by mouth at bedtime.     [name] spironolactone (ALDACTONE) 25 MG tablet Take 1 tablet (25 mg total) by mouth daily. 30 tablet 6  . torsemide (DEMADEX) 20 MG tablet Take 2 tabs (11m) every other day alternating with 3 tabs (631m every other day 270  tablet 3  . brimonidine (ALPHAGAN) 0.2 % ophthalmic solution Place 1 drop into the left eye 2 (two) times daily. 48 hours prior to surgery  1   No current facility-administered medications for this encounter.    Filed Vitals:   05/22/14 0911  BP: 110/62  Pulse: 61  Weight: 240 lb 8 oz (109.09 kg)  SpO2: 96%   General: NAD Wife present  Neck: Thick, JVP 7 cm. No thyromegaly or thyroid nodule.  Lungs: Clear to auscultation bilaterally with normal respiratory effort. CV: Nondisplaced PMI.  Heart regular S1/S2, no S3/S4, no murmur.  No edema.  No carotid bruit.  Normal pedal pulses.  Abdomen: Obese, Soft, tender to palpation, obese, no hepatosplenomegaly, non-distended Skin: Intact without lesions or rashes.  Neurologic: Alert and oriented x 3.  Psych: Flat affect. Extremities: No clubbing or cyanosis.   Assessment/plan: 1. Chronic systolic CHF: Cardiomyopathy x years.  Thought to be nonischemic but Cardiolite in 12/15 showed apical scar (no ischemia).  EF 20-25% with normal RV size and systolic function on 16/07 echo. Has St Jude ICD, narrow QRS at baseline so has not been CRT candidate.  When he was in atrial fibrillation, he was doing a lot of RV pacing so we cardioverted him.  He is now out of atrial fibrillation but is AV  sequentially paced (still RV pacing).  RHC in 3/16 showed low cardiac output with mixed pulmonary venous and pulmonary arterial hypertension ([place] may be due to vascular remodeling over time).  PCWP was elevated but not markedly so.  RA pressure was normal, suggesting preserved RV function.  CPX in 4/16 showed severe functional impairment but was submaximal.  NYHA class III symptoms, stable.  He does not appear volume overloaded on exam.  Creatinine has started to slowly rise, concern for progressive cardiorenal syndrome.  - I think we have reached the end of our ability to manage him with po medications. He will meet with Dr Prescott Gum. He met with the LVAD coordinator today and had a long discussion.  He is interested in pursuing LVAD.  He is not a transplant candidate at this point (weight, pulmonary hypertension).  Given low output with rising creatinine, I am going to bring him in on Monday to place PICC and initiate milrinone.  Will see how his renal function responds to milrinone and repeat RHC in a few weeks to see if PA pressure comes down.  Revatio would be an option as well. Would like to see PA pressure lower and creatinine lower for LVAD.  His RV function appears preserved by echo and by RHC assessment.  - Continue current torsemide, 60 mg daily.  - Continue lower dose of digoxin, check digoxin level today.   - Given low output and chronotropic incompetence on CPX, decrease Toprol XL to 50 mg daily.  - Continue 25 mg daily spironolactone. - Check BMET today.  If creatinine higher than prior level, will stop Entresto and start hydralazine 25 mg tid + Imdur 30 daily.  - I have spoken with Dr Rayann Heman about upgrading to CRT-D.  We had thought that keeping him in NSR would keep him from RV pacing.  Unfortunately, he is currently out of atrial fibrillation and AV sequential pacing (80+% RV pacing by interrogation today).  I do not think that CRT upgrade, however, is likely to help [place] enough to avoid LVAD.   2. CKD: BMET today.  As above, will stop Entresto and transition to hydralazine/nitrates if creatinine is higher.  3. Atrial fibrillation: He is now out of atrial fibrillation.  He is on Xarelto and amiodarone 200 mg bid.  Recent LFTs ok.  TSH high but followed by endocrinology for known hypothyroidism.  There is a good chance that he will go back into atrial fibrillation on milrinone, but I do not think that maintenance of NSR over the long-term is going to do enough to avoid advanced therapies.  4. Obesity:  Needs to lose weight.  Asked him to cut back on his portions and try low carb diet.   5. DM2: Continue to follow with endocrinology.   6. Depression/anxiety:  Doing better now that he is seeing a psychologist.   7. Fatigue: In addition to CHF, I think that OSA (that is untreated as he has had a hard time with CPAP) and depression play a large role. He is still working on getting a CPAP mask that works.  He has a sleep study this week.  8. CAD: Though to have nonischemic CMP, but Cardiolite showed large apical scar.  No chest pain.  Continue statin.  Not on ASA given Xarelto use.  If creatinine improves significantly on milrinone, may do coronary angiography with RHC in the future.  However, would like to avoid the dye load if possible. Will discuss with colleagues.   Followup on Monday for milrinone admission.   Loralie Champagne  05/22/2014

## 2014-05-22 NOTE — Progress Notes (Signed)
  Larry [name] is a 64 y.o. with  has a past medical history of Cardiomyopathy; CAD (coronary artery disease); Obesity; CVA (cerebral vascular accident); Pituitary tumor; Depression; Erectile dysfunction; DDD (degenerative disc disease); OSA (obstructive sleep apnea); Monoclonal gammopathy; CRI (chronic renal insufficiency); CKD (chronic kidney disease), stage III; Polyneuropathy in diabetes(357.2); Hypogonadotropic hypogonadism; Polycythemia, secondary; Hypercholesterolemia; Back pain; Monoclonal gammopathy of unknown significance; CHF (congestive heart failure); Neck pain; PAF (paroxysmal atrial fibrillation) (06/04/10); History of diverticulitis of colon; Type II or unspecified type diabetes mellitus with neurological manifestations, uncontrolled (2000); 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 Currently being evaluated for advanced therapies which include Left Ventricular Assist Device implantation.   Have had lengthy discussion with patient and wife about the indications and alternatives to Heartmate II VAD placement. I have reviewed in full the following: caregiver role, life style changes, follow up care, discharge planning, rehabilitation, VAD dressing supplies and driveline management care. The patient was provided with educational resources to take home and read more about the device offered. The patient understands that from this discussion it does not mean that they will receive the device, but that depends on an extensive evaluation process. The patient is aware of the fact that if at anytime they want to stop the evaluation process they can.   Zada Girt, RN

## 2014-05-22 NOTE — Telephone Encounter (Signed)
Pt's wife aware, new rx sent in

## 2014-05-23 NOTE — Addendum Note (Signed)
Encounter addended by: Martie Lee on: 05/23/2014 10:07 AM<BR>     Documentation filed: Charges VN

## 2014-05-24 ENCOUNTER — Encounter (HOSPITAL_COMMUNITY): Payer: 59

## 2014-05-27 ENCOUNTER — Observation Stay (HOSPITAL_COMMUNITY): Payer: 59

## 2014-05-27 ENCOUNTER — Other Ambulatory Visit (HOSPITAL_COMMUNITY): Payer: Self-pay | Admitting: *Deleted

## 2014-05-27 ENCOUNTER — Encounter (HOSPITAL_COMMUNITY): Payer: Self-pay | Admitting: *Deleted

## 2014-05-27 ENCOUNTER — Observation Stay (HOSPITAL_COMMUNITY)
Admission: AD | Admit: 2014-05-27 | Discharge: 2014-05-28 | Disposition: A | Discharge status: Home / Self Care | Payer: 59 | Source: Ambulatory Visit | Attending: Cardiology | Admitting: Cardiology

## 2014-05-27 DIAGNOSIS — Z8673 Personal history of transient ischemic attack (TIA), and cerebral infarction without residual deficits: Secondary | ICD-10-CM | POA: Diagnosis not present

## 2014-05-27 DIAGNOSIS — G4733 Obstructive sleep apnea (adult) (pediatric): Secondary | ICD-10-CM | POA: Insufficient documentation

## 2014-05-27 DIAGNOSIS — I5022 Chronic systolic (congestive) heart failure: Secondary | ICD-10-CM | POA: Diagnosis not present

## 2014-05-27 DIAGNOSIS — I480 Paroxysmal atrial fibrillation: Secondary | ICD-10-CM | POA: Diagnosis not present

## 2014-05-27 DIAGNOSIS — Z7901 Long term (current) use of anticoagulants: Secondary | ICD-10-CM | POA: Diagnosis not present

## 2014-05-27 DIAGNOSIS — F418 Other specified anxiety disorders: Secondary | ICD-10-CM | POA: Insufficient documentation

## 2014-05-27 DIAGNOSIS — F411 Generalized anxiety disorder: Secondary | ICD-10-CM | POA: Diagnosis present

## 2014-05-27 DIAGNOSIS — Z87891 Personal history of nicotine dependence: Secondary | ICD-10-CM | POA: Diagnosis not present

## 2014-05-27 DIAGNOSIS — I129 Hypertensive chronic kidney disease with stage 1 through stage 4 chronic kidney disease, or unspecified chronic kidney disease: Secondary | ICD-10-CM | POA: Insufficient documentation

## 2014-05-27 DIAGNOSIS — Z950 Presence of cardiac pacemaker: Secondary | ICD-10-CM | POA: Diagnosis not present

## 2014-05-27 DIAGNOSIS — I5023 Acute on chronic systolic (congestive) heart failure: Secondary | ICD-10-CM | POA: Diagnosis present

## 2014-05-27 DIAGNOSIS — E669 Obesity, unspecified: Secondary | ICD-10-CM | POA: Insufficient documentation

## 2014-05-27 DIAGNOSIS — E780 Pure hypercholesterolemia: Secondary | ICD-10-CM | POA: Diagnosis not present

## 2014-05-27 DIAGNOSIS — I2510 Atherosclerotic heart disease of native coronary artery without angina pectoris: Secondary | ICD-10-CM | POA: Insufficient documentation

## 2014-05-27 DIAGNOSIS — I429 Cardiomyopathy, unspecified: Secondary | ICD-10-CM | POA: Insufficient documentation

## 2014-05-27 DIAGNOSIS — I739 Peripheral vascular disease, unspecified: Secondary | ICD-10-CM | POA: Insufficient documentation

## 2014-05-27 DIAGNOSIS — N183 Chronic kidney disease, stage 3 (moderate): Secondary | ICD-10-CM | POA: Diagnosis not present

## 2014-05-27 DIAGNOSIS — Z794 Long term (current) use of insulin: Secondary | ICD-10-CM | POA: Diagnosis not present

## 2014-05-27 DIAGNOSIS — E119 Type 2 diabetes mellitus without complications: Secondary | ICD-10-CM | POA: Insufficient documentation

## 2014-05-27 DIAGNOSIS — Z515 Encounter for palliative care: Secondary | ICD-10-CM

## 2014-05-27 DIAGNOSIS — Z7189 Other specified counseling: Secondary | ICD-10-CM

## 2014-05-27 DIAGNOSIS — Z01818 Encounter for other preprocedural examination: Secondary | ICD-10-CM

## 2014-05-27 LAB — CBC WITH DIFFERENTIAL/PLATELET
BASOS ABS: 0.1 10*3/uL (ref 0.0–0.1)
BASOS PCT: 1 % (ref 0–1)
Eosinophils Absolute: 0.2 10*3/uL (ref 0.0–0.7)
Eosinophils Relative: 3 % (ref 0–5)
HEMATOCRIT: 36.9 % — AB (ref 39.0–52.0)
Hemoglobin: 12.1 g/dL — ABNORMAL LOW (ref 13.0–17.0)
Lymphocytes Relative: 21 % (ref 12–46)
Lymphs Abs: 1.6 10*3/uL (ref 0.7–4.0)
MCH: 31.9 pg (ref 26.0–34.0)
MCHC: 32.8 g/dL (ref 30.0–36.0)
MCV: 97.4 fL (ref 78.0–100.0)
Monocytes Absolute: 0.8 10*3/uL (ref 0.1–1.0)
Monocytes Relative: 11 % (ref 3–12)
NEUTROS ABS: 4.9 10*3/uL (ref 1.7–7.7)
Neutrophils Relative %: 64 % (ref 43–77)
PLATELETS: 207 10*3/uL (ref 150–400)
RBC: 3.79 MIL/uL — ABNORMAL LOW (ref 4.22–5.81)
RDW: 16.3 % — AB (ref 11.5–15.5)
WBC: 7.6 10*3/uL (ref 4.0–10.5)

## 2014-05-27 LAB — COMPREHENSIVE METABOLIC PANEL
ALT: 15 U/L (ref 0–53)
ANION GAP: 9 (ref 5–15)
AST: 21 U/L (ref 0–37)
Albumin: 3.4 g/dL — ABNORMAL LOW (ref 3.5–5.2)
Alkaline Phosphatase: 63 U/L (ref 39–117)
BILIRUBIN TOTAL: 1.2 mg/dL (ref 0.3–1.2)
BUN: 35 mg/dL — ABNORMAL HIGH (ref 6–23)
CALCIUM: 8.8 mg/dL (ref 8.4–10.5)
CHLORIDE: 99 mmol/L (ref 96–112)
CO2: 28 mmol/L (ref 19–32)
Creatinine, Ser: 1.95 mg/dL — ABNORMAL HIGH (ref 0.50–1.35)
GFR calc Af Amer: 41 mL/min — ABNORMAL LOW (ref >90)
GFR, EST NON AFRICAN AMERICAN: 35 mL/min — AB (ref >90)
Glucose, Bld: 163 mg/dL — ABNORMAL HIGH (ref 70–99)
Potassium: 4.3 mmol/L (ref 3.5–5.1)
Sodium: 136 mmol/L (ref 135–145)
Total Protein: 6.7 g/dL (ref 6.0–8.3)

## 2014-05-27 LAB — TSH: TSH: 10.12 u[IU]/mL — ABNORMAL HIGH (ref 0.350–4.500)

## 2014-05-27 LAB — GLUCOSE, CAPILLARY
Glucose-Capillary: 131 mg/dL — ABNORMAL HIGH (ref 70–99)
Glucose-Capillary: 76 mg/dL (ref 70–99)
Glucose-Capillary: 93 mg/dL (ref 70–99)

## 2014-05-27 LAB — MAGNESIUM: Magnesium: 2.3 mg/dL (ref 1.5–2.5)

## 2014-05-27 LAB — TYPE AND SCREEN
ABO/RH(D): A POS
Antibody Screen: NEGATIVE

## 2014-05-27 LAB — ABO/RH: ABO/RH(D): A POS

## 2014-05-27 LAB — DIGOXIN LEVEL: Digoxin Level: 0.6 ng/mL — ABNORMAL LOW (ref 0.8–2.0)

## 2014-05-27 LAB — MRSA PCR SCREENING: MRSA by PCR: NEGATIVE

## 2014-05-27 LAB — BRAIN NATRIURETIC PEPTIDE: B Natriuretic Peptide: 1627.8 pg/mL — ABNORMAL HIGH (ref 0.0–100.0)

## 2014-05-27 MED ORDER — BRIMONIDINE TARTRATE 0.2 % OP SOLN: 1.0000 [drp] | Freq: Two times a day (BID) | OPHTHALMIC | Status: DC

## 2014-05-27 MED ORDER — ONDANSETRON HCL 4 MG/2ML IJ SOLN: 4.0000 mg | Freq: Four times a day (QID) | INTRAMUSCULAR | Status: DC | PRN

## 2014-05-27 MED ORDER — METOPROLOL SUCCINATE ER 25 MG PO TB24
25.0000 mg | ORAL_TABLET | Freq: Two times a day (BID) | ORAL | Status: DC
  Administered 2014-05-27 – 2014-05-28 (×2): 25 mg via ORAL
  Filled 2014-05-27 (×4): qty 1

## 2014-05-27 MED ORDER — SENNOSIDES 8.6 MG PO TABS: 1.0000 | ORAL_TABLET | Freq: Every day | ORAL | Status: DC

## 2014-05-27 MED ORDER — MELATONIN 10 MG PO TABS: 10.0000 mg | ORAL_TABLET | Freq: Every day | ORAL | Status: DC

## 2014-05-27 MED ORDER — SODIUM CHLORIDE 0.9 % IJ SOLN: 10.0000 mL | INTRAMUSCULAR | Status: DC | PRN

## 2014-05-27 MED ORDER — INSULIN GLARGINE 100 UNIT/ML SOLOSTAR PEN: 36.0000 [IU] | PEN_INJECTOR | Freq: Every day | SUBCUTANEOUS | Status: DC

## 2014-05-27 MED ORDER — SODIUM CHLORIDE 0.9 % IJ SOLN
10.0000 mL | Freq: Two times a day (BID) | INTRAMUSCULAR | Status: DC
  Administered 2014-05-27 (×2): 10 mL

## 2014-05-27 MED ORDER — INSULIN ASPART 100 UNIT/ML ~~LOC~~ SOLN: 0.0000 [IU] | Freq: Three times a day (TID) | SUBCUTANEOUS | Status: DC

## 2014-05-27 MED ORDER — SODIUM CHLORIDE 0.9 % IV SOLN
250.0000 mL | INTRAVENOUS | Status: DC | PRN
  Administered 2014-05-27: 500 mL via INTRAVENOUS

## 2014-05-27 MED ORDER — KETOROLAC TROMETHAMINE 0.5 % OP SOLN
1.0000 [drp] | Freq: Two times a day (BID) | OPHTHALMIC | Status: DC
  Administered 2014-05-27 – 2014-05-28 (×2): 1 [drp] via OPHTHALMIC
  Filled 2014-05-27: qty 3

## 2014-05-27 MED ORDER — ADULT MULTIVITAMIN W/MINERALS CH
1.0000 | ORAL_TABLET | Freq: Every day | ORAL | Status: DC
  Administered 2014-05-28: 1 via ORAL
  Filled 2014-05-27: qty 1

## 2014-05-27 MED ORDER — DULOXETINE HCL 60 MG PO CPEP
60.0000 mg | ORAL_CAPSULE | Freq: Two times a day (BID) | ORAL | Status: DC
  Administered 2014-05-27 – 2014-05-28 (×2): 60 mg via ORAL
  Filled 2014-05-27 (×4): qty 1

## 2014-05-27 MED ORDER — METOPROLOL SUCCINATE ER 50 MG PO TB24: 50.0000 mg | ORAL_TABLET | Freq: Every day | ORAL | Status: DC

## 2014-05-27 MED ORDER — PREDNISOLONE ACETATE 1 % OP SUSP: 1.0000 [drp] | Freq: Two times a day (BID) | OPHTHALMIC | Status: DC

## 2014-05-27 MED ORDER — OXYCODONE HCL 5 MG PO TABS
5.0000 mg | ORAL_TABLET | ORAL | Status: DC | PRN
  Administered 2014-05-27 – 2014-05-28 (×2): 5 mg via ORAL
  Filled 2014-05-27 (×2): qty 1

## 2014-05-27 MED ORDER — ALPRAZOLAM 0.5 MG PO TABS
0.5000 mg | ORAL_TABLET | Freq: Three times a day (TID) | ORAL | Status: DC | PRN
  Administered 2014-05-27 (×2): 0.5 mg via ORAL
  Filled 2014-05-27 (×2): qty 1

## 2014-05-27 MED ORDER — RIVAROXABAN 15 MG PO TABS
15.0000 mg | ORAL_TABLET | Freq: Every day | ORAL | Status: DC
  Administered 2014-05-27: 15 mg via ORAL
  Filled 2014-05-27 (×2): qty 1

## 2014-05-27 MED ORDER — OFLOXACIN 0.3 % OP SOLN
1.0000 [drp] | Freq: Two times a day (BID) | OPHTHALMIC | Status: DC
Start: 2014-05-27 — End: 2014-05-28
  Administered 2014-05-27 – 2014-05-28 (×2): 1 [drp] via OPHTHALMIC
  Filled 2014-05-27: qty 5

## 2014-05-27 MED ORDER — INSULIN GLARGINE 100 UNIT/ML ~~LOC~~ SOLN
32.0000 [IU] | Freq: Every day | SUBCUTANEOUS | Status: DC
  Administered 2014-05-27: 32 [IU] via SUBCUTANEOUS
  Filled 2014-05-27 (×3): qty 0.32

## 2014-05-27 MED ORDER — INSULIN ASPART 100 UNIT/ML ~~LOC~~ SOLN
30.0000 [IU] | Freq: Three times a day (TID) | SUBCUTANEOUS | Status: DC
Start: 2014-05-27 — End: 2014-05-28
  Administered 2014-05-27 – 2014-05-28 (×4): 30 [IU] via SUBCUTANEOUS

## 2014-05-27 MED ORDER — OXYCODONE-ACETAMINOPHEN 5-325 MG PO TABS
1.0000 | ORAL_TABLET | ORAL | Status: DC | PRN
  Administered 2014-05-27 – 2014-05-28 (×2): 1 via ORAL
  Filled 2014-05-27 (×2): qty 1

## 2014-05-27 MED ORDER — TORSEMIDE 20 MG PO TABS
20.0000 mg | ORAL_TABLET | Freq: Every day | ORAL | Status: DC
Start: 2014-05-27 — End: 2014-05-27

## 2014-05-27 MED ORDER — MILRINONE IN DEXTROSE 20 MG/100ML IV SOLN
0.2500 ug/kg/min | INTRAVENOUS | Status: DC
  Administered 2014-05-27 – 2014-05-28 (×3): 0.25 ug/kg/min via INTRAVENOUS
  Filled 2014-05-27 (×3): qty 100

## 2014-05-27 MED ORDER — TORSEMIDE 20 MG PO TABS
40.0000 mg | ORAL_TABLET | Freq: Every day | ORAL | Status: DC
  Administered 2014-05-28: 40 mg via ORAL
  Filled 2014-05-27: qty 2

## 2014-05-27 MED ORDER — ISOSORBIDE MONONITRATE ER 30 MG PO TB24
30.0000 mg | ORAL_TABLET | Freq: Every day | ORAL | Status: DC
  Administered 2014-05-28: 30 mg via ORAL
  Filled 2014-05-27: qty 1

## 2014-05-27 MED ORDER — SODIUM CHLORIDE 0.9 % IJ SOLN
3.0000 mL | Freq: Two times a day (BID) | INTRAMUSCULAR | Status: DC
  Administered 2014-05-27: 3 mL via INTRAVENOUS

## 2014-05-27 MED ORDER — SENNA 8.6 MG PO TABS
1.0000 | ORAL_TABLET | Freq: Every day | ORAL | Status: DC
  Administered 2014-05-27: 8.6 mg via ORAL
  Filled 2014-05-27 (×2): qty 1

## 2014-05-27 MED ORDER — SPIRONOLACTONE 25 MG PO TABS
25.0000 mg | ORAL_TABLET | Freq: Every day | ORAL | Status: DC
  Filled 2014-05-27: qty 1

## 2014-05-27 MED ORDER — ACETAMINOPHEN 325 MG PO TABS: 650.0000 mg | ORAL_TABLET | ORAL | Status: DC | PRN

## 2014-05-27 MED ORDER — OXYCODONE-ACETAMINOPHEN 10-325 MG PO TABS: 1.0000 | ORAL_TABLET | ORAL | Status: DC | PRN

## 2014-05-27 MED ORDER — DIGOXIN 0.0625 MG HALF TABLET
0.0625 mg | ORAL_TABLET | Freq: Every day | ORAL | Status: DC
  Administered 2014-05-28: 0.0625 mg via ORAL
  Filled 2014-05-27: qty 1

## 2014-05-27 MED ORDER — LEVOTHYROXINE SODIUM 50 MCG PO TABS
50.0000 ug | ORAL_TABLET | Freq: Every day | ORAL | Status: DC
  Administered 2014-05-28: 50 ug via ORAL
  Filled 2014-05-27 (×2): qty 1

## 2014-05-27 MED ORDER — SODIUM CHLORIDE 0.9 % IJ SOLN: 3.0000 mL | INTRAMUSCULAR | Status: DC | PRN

## 2014-05-27 MED ORDER — AMIODARONE HCL 200 MG PO TABS
200.0000 mg | ORAL_TABLET | Freq: Two times a day (BID) | ORAL | Status: DC
  Administered 2014-05-27 – 2014-05-28 (×2): 200 mg via ORAL
  Filled 2014-05-27 (×3): qty 1

## 2014-05-27 MED ORDER — HYDRALAZINE HCL 25 MG PO TABS
25.0000 mg | ORAL_TABLET | Freq: Three times a day (TID) | ORAL | Status: DC
  Administered 2014-05-27 – 2014-05-28 (×3): 25 mg via ORAL
  Filled 2014-05-27 (×5): qty 1

## 2014-05-27 NOTE — Progress Notes (Signed)
[facility]  Patient Status: New patient for [facility] this admission  [facility] is providing the following services: HHRN and Home Infusion [facility] Team for Home Milrinone. [facility] hospital infusion coordinator will provide in hospital teaching regarding POC for home milrinone.  [facility] hospital team will follow Larry [name] while inpatient to support transition home.  Teaching visit planned for 11 AM 05-28-14 on with pt and wife   If patient discharges after hours, please call [phone].   Larry [name] 05/27/2014, 5:22 PM

## 2014-05-27 NOTE — Progress Notes (Signed)
Peripherally Inserted Central Catheter/Midline Placement  The IV Nurse has discussed with the patient and/or persons authorized to consent for the patient, the purpose of this procedure and the potential benefits and risks involved with this procedure.  The benefits include less needle sticks, lab draws from the catheter and patient may be discharged home with the catheter.  Risks include, but not limited to, infection, bleeding, blood clot (thrombus formation), and puncture of an artery; nerve damage and irregular heat beat.  Alternatives to this procedure were also discussed.  PICC/Midline Placement Documentation  PICC / Midline Double Lumen 84/13/24 PICC Right Basilic 42 cm 0 cm (Active)  Indication for Insertion or Continuance of Line Vasoactive infusions 05/27/2014 11:00 AM  Exposed Catheter (cm) 0 cm 05/27/2014 11:00 AM  Dressing Change Due 06/03/14 05/27/2014 11:00 AM       Larry [name] 05/27/2014, 11:34 AM

## 2014-05-27 NOTE — Progress Notes (Addendum)
CARDIAC REHAB PHASE I   PRE:  Rate/Rhythm:60 SR  BP:  Sitting: 128/67        SaO2: 95 RA  MODE:  Ambulation: 500 ft   POST:  Rate/Rhythm: 68 SR  BP:  Sitting: 150/80         SaO2: 95 RA  Pt ambulated 6 minute walk test, 500 ft, one assist, IV, mostly steady gait, tolerated fair. Standing rest x 2 at 250 for 1 minute and 450 ft for 30 seconds. Pt c/o SOB and feeling like his "legs are gonna give out."  Pt states this feels "normal" for him.  VSS. Pt wife at bedside, pt returned to chair with call light within reach.  [phone]   Lenna Sciara, RN, BSN 05/27/2014 3:28 PM

## 2014-05-27 NOTE — Progress Notes (Deleted)
Ambulated 1050 feet with pt around unit. Denied any SOB and was able to hold a conversation. Heart rate remained stable (in the 70's) throughout the walk. Pt stated he felt a little tired towards the end, but insisted on finishing the lap without sitting down. Returned pt to room and to bed with call light within reach.

## 2014-05-27 NOTE — Care Management Note (Addendum)
    Page 1 of 2   05/28/2014     11:10:14 AM CARE MANAGEMENT NOTE 05/28/2014  Patient:  Larry [name], Larry [name]   Account Number:  [id number]  Date Initiated:  05/27/2014  Documentation initiated by:  Elissa Hefty  Subjective/Objective Assessment:   adm w heart failure     Action/Plan:   lives w wife, pcp dr Lennette Bihari little   Anticipated DC Date:  05/28/2014   Anticipated DC Plan:  [place]  CM consult      [place] Choice  [place]   Choice offered to / List presented to:  C-1 Patient   DME arranged  IV PUMP/EQUIPMENT      DME agency  [place] arranged  HH-1 RN  [place].   Status of service:   Medicare Important Message given?   (If response is "NO", the following Medicare IM given date fields will be blank) Date Medicare IM given:   Medicare IM given by:   Date Additional Medicare IM given:   Additional Medicare IM given by:    Discharge Disposition:  [place]  Per UR Regulation:  Reviewed for med. necessity/level of care/duration of stay  If discussed at [place] of Stay Meetings, dates discussed:    Comments:  4/19  1108 debbie Zakk Borgen rn,bsn spoke w pam w [facility]. they are aware pt for dc home w iv milrinone today. [facility] will hook up to home pump prior to leaving hosp.  4/18 1501 debbie Shandy Checo rn,bsn spoke w pt and wife. went over hhc agency list. no pref as long as hhc agency take [facility] ins. ref to debbie and pam w [facility] for home iv milrinone.

## 2014-05-27 NOTE — H&P (Signed)
Advanced [facility] Failure Team History and Physical Note   Primary Physician: Dr Rex Kras  Primary Cardiologist:  Dr Aundra Dubin   Reason for Admission: Milrinone/LVAD work up   HPI:   64 yo with history of chronic systolic CHF (nonischemic cardiomyopathy), CKD, prior CVA, and paroxysmal atrial fibrillation. Patient says that he has been known to have a low EF for years. He has a [employer], [employer] ICD. He had LHC in 2009 with nonobstructive disease. Last echo in 12/15 showed EF 20-25% with normal RV size and systolic function.   Patient had [facility] 8/15 showing low cardiac output, mildly elevated PCWP and primarily pulmonary venous hypertension. He was transitioned from Lasix to torsemide and started on digoxin. Entresto was started and valsartan stopped.   Adenosine Cardiolite was done in 12/15 showing large area of apical scar but no ischemia. This raised concern for possible ischemic cardiomyopathy. He was admitted later in 12/15 with acute on chronic systolic CHF. He also was noted to have gone into atrial fibrillation during this hospitalization, amiodarone was started in preparation for possible DCCV. Troponin was mildly elevated in the hospital in the absence of chest pain, likely demand ischemia. He was diuresed with IV Lasix and was discharged soon after. He was cardioverted back to NSR on 02/14/14. He was cardioverted again to NSR in 3/16. He had RHC done 05/08/14 with mixed pulmonary hypertension and PWCP 25, CI 1.75. CPX done 4/16 showed severe functional limitation.   Admitted today to start milrinone and continue LVAD work up. Remains fatigued and dyspneic with exertion. Lives at home with wife and she assists with medications. He has met with LVAD coordinators and would like to pursue LVAD if indicated. . Weight at home  230-238 pounds. Complains of fatigue and dyspnea with exertion.    RHC 05/08/2014 RA mean 7 RV 80/12 PA 76/27, mean 43 PCWP mean 25 Oxygen saturations: PA 56% AO  93% Cardiac Output (Fick) 3.89  Cardiac Index (Fick) 1.75 PVR 4.62  Review of Systems: [y] = yes, [ ] = no   General: Weight gain [ ]; Weight loss [ ]; Anorexia [ ]; Fatigue [Y ]; Fever [ ]; Chills [ ]; Weakness [Y ]  Cardiac: Chest pain/pressure [ ]; Resting SOB [ ]; Exertional SOB [Y ]; Orthopnea [ ]; Pedal Edema [ ]; Palpitations [ ]; Syncope [ ]; Presyncope [ ]; Paroxysmal nocturnal dyspnea[ ]  Pulmonary: Cough [ ]; Wheezing[ ]; Hemoptysis[ ]; Sputum [ ]; Snoring [ ]  GI: Vomiting[ ]; Dysphagia[ ]; Melena[ ]; Hematochezia [ ]; Heartburn[ ]; Abdominal pain [ ]; Constipation [ ]; Diarrhea [ ]; BRBPR [ ]  GU: Hematuria[ ]; Dysuria [ ]; Nocturia[ ]  Vascular: Pain in legs with walking [ ]; Pain in feet with lying flat [ ]; Non-healing sores [ ]; Stroke [ ]; TIA [ ]; Slurred speech [ ];  Neuro: Headaches[ ]; Vertigo[ ]; Seizures[ ]; Paresthesias[ ];Blurred vision [ ]; Diplopia [ ]; Vision changes [ ]  Ortho/Skin: Arthritis [ ]; Joint pain Jazmín.Cullens ]; Muscle pain [ ]; Joint swelling [ ]; Back Pain [ ]; Rash [ ]  Psych: Depression[Y ]; Anxiety[Y ]  Heme: Bleeding problems [ ]; Clotting disorders [ ]; Anemia [ ]  Endocrine: Diabetes [Y ]; Thyroid dysfunction[ ]  Home Medications Prior to Admission medications   Medication Sig Start Date End Date Taking? Authorizing Provider  ALPRAZolam Duanne Moron) 0.5 MG tablet Take 0.5 mg by mouth 3 (three) times daily as needed for anxiety or  sleep.    Historical Provider, MD  amiodarone (PACERONE) 200 MG tablet Take 1 tablet (200 mg total) by mouth 2 (two) times daily. 04/11/14   [name], MD  atorvastatin (LIPITOR) 40 MG tablet TAKE 1 TABLET DAILY Patient taking differently: TAKE 1 TABLET BY MOUTH DAILY 03/18/14   [name], MD  brimonidine (ALPHAGAN) 0.2 % ophthalmic solution Place 1 drop into the left eye 2 (two) times daily. 48 hours prior to surgery 02/26/14   Historical Provider, MD  digoxin (LANOXIN) 0.125 MG tablet Take 0.5 tablets  (0.0625 mg total) by mouth daily. 05/01/14   [name], MD  DULoxetine (CYMBALTA) 30 MG capsule Take 60 mg by mouth 2 (two) times daily.     Historical Provider, MD  GLUCAGON EMERGENCY 1 MG injection Inject 1 mg as directed once. For hypotension 01/30/14   Historical Provider, MD  hydrALAZINE (APRESOLINE) 25 MG tablet Take 1 tablet (25 mg total) by mouth 3 (three) times daily. 05/22/14   [name], MD  insulin aspart (NOVOLOG) 100 UNIT/ML injection Inject 24 Units into the skin 3 (three) times daily before meals.     Historical Provider, MD  isosorbide mononitrate (IMDUR) 30 MG 24 hr tablet Take 1 tablet (30 mg total) by mouth daily. 05/22/14   [name], MD  ketorolac (ACULAR) 0.5 % ophthalmic solution Place 1 drop into the left eye 2 (two) times daily. Start 1 week prior to surgery 02/26/14   Historical Provider, MD  LANTUS SOLOSTAR 100 UNIT/ML Solostar Pen Inject 36 Units into the skin at bedtime.  11/03/13   Historical Provider, MD  levothyroxine (SYNTHROID, LEVOTHROID) 25 MCG tablet Take 50 mcg by mouth daily before breakfast.  03/06/14   Historical Provider, MD  Melatonin 10 MG TABS Take 10 mg by mouth at bedtime.    Historical Provider, MD  metoprolol succinate (TOPROL-XL) 50 MG 24 hr tablet Take 1 tablet (50 mg total) by mouth daily. Take 50 mg by mouth in the am and take 50 mg by mouth in the pm 05/27/14   [name], MD  Multiple Vitamin (MULITIVITAMIN WITH MINERALS) TABS Take 1 tablet by mouth daily.    Historical Provider, MD  ofloxacin (OCUFLOX) 0.3 % ophthalmic solution Place 1 drop into both eyes 2 (two) times daily. Start 72 hours prior to surgery 02/26/14   Historical Provider, MD  oxyCODONE-acetaminophen (PERCOCET) 10-325 MG per tablet Take 1 tablet by mouth every 4 (four) hours as needed for pain. For pain    Historical Provider, MD  prednisoLONE acetate (PRED FORTE) 1 % ophthalmic suspension Place 1 drop into the left eye 2 (two) times daily. Four times a day. Start  after surgery 02/26/14   Historical Provider, MD  rivaroxaban (XARELTO) 15 MG TABS tablet Take 1 tablet (15 mg total) by mouth daily with supper. 02/11/14   [name], MD  senna (SENOKOT) 8.6 MG tablet Take 1 tablet by mouth at bedtime.     Historical Provider, MD  spironolactone (ALDACTONE) 25 MG tablet Take 1 tablet (25 mg total) by mouth daily. 10/09/13   [name], MD  torsemide (DEMADEX) 20 MG tablet Take 2 tabs (49m) every other day alternating with 3 tabs (667m every other day 05/01/14   DaLarey DresserMD    Past Medical History: Past Medical History  Diagnosis Date  . Cardiomyopathy     nonischemic (EF 25%)  . CAD (coronary artery disease)     nonobstructive  CAD by cath 2009  . Obesity   . CVA (cerebral vascular accident)     CVA 2007 without residual deficit  . 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 Depression   . Erectile dysfunction   . DDD (degenerative disc disease)   . OSA (obstructive sleep apnea)   . Monoclonal gammopathy   . CRI (chronic renal insufficiency)     CRI (baseline creatinine 1.6)  . CKD (chronic kidney disease), stage III     moderate  . 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 Hypogonadotropic hypogonadism   . Polycythemia, secondary     improved/resolved  . Hypercholesterolemia   . Back pain     F/B [name]  . Monoclonal gammopathy of unknown significance     per [name]  . 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 Neck pain     F/B [name]  . PAF (paroxysmal atrial fibrillation) 06/04/10    Hx of, has device  . History of diverticulitis of colon   . Type II or unspecified type diabetes mellitus with neurological manifestations, uncontrolled 2000  . Nonproliferative diabetic retinopathy NOS(362.03)   . Ventricular tachycardia 10/25/13    appropriate ICD shock, VT CL 230-240 msec  .  Confusion 02/01/2014  . Hypertension   . Anginal pain   . Dysrhythmia   . AICD (automatic cardioverter/defibrillator) present   . Presence of permanent cardiac pacemaker   . Anxiety   . Shortness of breath dyspnea   . Peripheral vascular disease     2007    Past Surgical History: Past Surgical History  Procedure Laterality Date  . Gamma knife surgery for pituitary tumor    . Tonsillectomy    . Cardiac defibrillator placement  02/19/10    By JA.   . Right heart catheterization N/A 09/17/2013    Procedure: RIGHT HEART CATH;  Surgeon: [name], MD;  Location: [facility] CATH LAB;  Service: Cardiovascular;  Laterality: N/A;  . Cardiac catheterization    . Brain surgery    . Insert / replace / remove pacemaker    . Cardioversion N/A 02/14/2014    Procedure: CARDIOVERSION;  Surgeon: [name], MD;  Location: [place];  Service: Cardiovascular;  Laterality: N/A;  . Cardioversion N/A 05/02/2014    Procedure: CARDIOVERSION;  Surgeon: [name], MD;  Location: [facility] ENDOSCOPY;  Service: Cardiovascular;  Laterality: N/A;  . Right heart catheterization N/A 05/08/2014    Procedure: RIGHT HEART CATH;  Surgeon: [name], MD;  Location: [facility] CATH LAB;  Service: Cardiovascular;  Laterality: N/A;    Family History: Family History  Problem Relation Age of Onset  . Lung cancer    . Cancer Mother     skin  . Dementia Mother   . Depression Mother   . Heart disease Father   . Hypertension Father   . Lung cancer Father   . Obesity Father   . Obesity Sister   . Hypertension Sister   . Diabetes Brother   . Hypertension Brother   . Heart disease Brother   . Obesity Brother   . Lung cancer Paternal Uncle   . Diabetes Paternal Uncle     requiring leg amputations     Social History: History   Social History  . Marital Status: Married    Spouse Name: N/A  .  Number of Children: N/A  . Years of Education: N/A   Occupational History  . Disabled    Social History Main  Topics  . Smoking status: Former Smoker    Types: Cigars    Quit date: 02/09/1983  . Smokeless tobacco: Not on file     Comment: 25 yrs ago  . Alcohol Use: No  . Drug Use: No  . Sexual Activity: No   Other Topics Concern  . Not on file   Social History Narrative    Allergies:  Allergies  Allergen Reactions  . Bydureon [Exenatide] Other (See Comments)    sweating  . Losartan Potassium Other (See Comments)    insomnia    Objective:    Vital Signs:   Temp:  [98.5 F (36.9 C)] 98.5 F (36.9 C) (04/18 0930) Pulse Rate:  [67] 67 (04/18 0930) Resp:  [15] 15 (04/18 0930) BP: (122)/(66) 122/66 mmHg (04/18 0930) SpO2:  [99 %] 99 % (04/18 0930) Weight:  [238 lb 8.6 oz (108.2 kg)] 238 lb 8.6 oz (108.2 kg) (04/18 0930)   Filed Weights   05/27/14 0930  Weight: 238 lb 8.6 oz (108.2 kg)    Physical Exam: General:  Well appearing. No resp difficulty HEENT: normal Neck: supple. JVP 5-6. Carotids 2+ bilat; no bruits. No lymphadenopathy or thryomegaly appreciated. Cor: PMI nondisplaced. Regular rate & rhythm. No rubs, gallops or murmurs. Lungs: clear Abdomen: obese, soft, nontender, nondistended. No hepatosplenomegaly. No bruits or masses. Good bowel sounds. Extremities: no cyanosis, clubbing, rash, edema Neuro: alert & orientedx3, cranial nerves grossly intact. moves all 4 extremities w/o difficulty. Affect pleasant  Telemetry: SR 60  Labs: Basic Metabolic Panel:  Recent Labs Lab 05/22/14 1028  NA 136  K 4.3  CL 100  CO2 30  GLUCOSE 118*  BUN 43*  CREATININE 2.28*  CALCIUM 9.1    Liver Function Tests: No results for input(s): AST, ALT, ALKPHOS, BILITOT, PROT, ALBUMIN in the last 168 hours. No results for input(s): LIPASE, AMYLASE in the last 168 hours. No results for input(s): AMMONIA in the last 168 hours.  CBC: No results for input(s): WBC, NEUTROABS, HGB, HCT, MCV, PLT in the last 168 hours.  Cardiac Enzymes: No results for input(s): CKTOTAL, CKMB,  CKMBINDEX, TROPONINI in the last 168 hours.  BNP: BNP (last 3 results)  Recent Labs  02/22/14 1135 04/30/14 0830 05/22/14 1029  BNP 952.2* 780.1* 935.2*    ProBNP (last 3 results)  Recent Labs  10/09/13 0926 11/14/13 0926 01/14/14 1112  PROBNP 1561.0* 1261.0* 5769.0*     CBG: No results for input(s): GLUCAP in the last 168 hours.  Coagulation Studies: No results for input(s): LABPROT, INR in the last 72 hours.  Other results: EKG:   Imaging:  No results found.      Assessment/Plan    Admitted for initiation of milrinone and to continue LVAD work up.   1. Chronic Systolic HF- nonischemic but Cardiolite in 12/15 showed apical scar (no ischemia). EF 20-25% with normal RV size and systolic function on 62/22 echo. Has [employer] ICD . Most recent [place] in 04/2014 showed low cardiac output with mixed pulmonary venous and pulmonary arterial hypertension ([place] may be due to vascular remodeling over time). CPX in 4/16 showed severe functional impairment but was submaximal. NYHA IIIB. Today we will place PICC for milrinone 0.25 mcg.  Check CO-OX after milrinone started. Will likely need home milrinone. Consult case manager for home milrinone.  - Hold BB for now. Continue  dig 0.125 mg dialy check dig level now.  - Continue hydralazine 25 mg tid and imdur 30 mg daily.  - No ace or spiro with ckd.  Check Labs now.  2. PAF: Maintaining NSR. On amiodarone 200 mg twice a day and Xarelto 15 mg daily 3. CKD: Check BMET now. No Ace or Spiro  4.  CAD: Cardiolite showed large apical scar. No chest pain. Continue statin. Not on ASA given Xarelto use. If creatinine improves significantly on milrinone, may do coronary angiography with RHC in the future. However, would like to avoid the dye load if possible. 5. DM2: Continue home regimen. Check hgb A1C. Sliding scale ordered 6. Depression/Anxiety: Continue home regimen  7. Obesity:   Length of Stay: 0  CLEGG,AMY NP-C  05/27/2014,  10:08 AM  Advanced Heart Failure Team Pager [phone] (M-F; [place])  Please contact [facility] for night-coverage after hours (4p -7a ) and weekends on [email or web address]  Patient seen with NP, agree with the above note.  He is here for milrinone initiation due to depressed cardiac index.  Creatinine better today off ACEI.  Will follow CVP and co-ox, home tomorrow if doing well on meds.   Will initiate LVAD workup.  Will need to get hold of cath films.  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  Cardiac amyloidosis less likely given chronicity of symptoms (EF about 20% since at least 2009).   Loralie Champagne 05/27/2014 2:13 PM

## 2014-05-28 ENCOUNTER — Observation Stay (HOSPITAL_COMMUNITY): Payer: 59

## 2014-05-28 DIAGNOSIS — Z7901 Long term (current) use of anticoagulants: Secondary | ICD-10-CM | POA: Diagnosis not present

## 2014-05-28 DIAGNOSIS — F411 Generalized anxiety disorder: Secondary | ICD-10-CM | POA: Diagnosis present

## 2014-05-28 DIAGNOSIS — Z515 Encounter for palliative care: Secondary | ICD-10-CM

## 2014-05-28 DIAGNOSIS — I480 Paroxysmal atrial fibrillation: Secondary | ICD-10-CM | POA: Diagnosis not present

## 2014-05-28 DIAGNOSIS — Z7189 Other specified counseling: Secondary | ICD-10-CM

## 2014-05-28 DIAGNOSIS — I2589 Other forms of chronic ischemic heart disease: Secondary | ICD-10-CM | POA: Diagnosis not present

## 2014-05-28 DIAGNOSIS — Z794 Long term (current) use of insulin: Secondary | ICD-10-CM | POA: Diagnosis not present

## 2014-05-28 DIAGNOSIS — N183 Chronic kidney disease, stage 3 (moderate): Secondary | ICD-10-CM | POA: Diagnosis not present

## 2014-05-28 DIAGNOSIS — I5022 Chronic systolic (congestive) heart failure: Secondary | ICD-10-CM | POA: Diagnosis not present

## 2014-05-28 LAB — GLUCOSE, CAPILLARY
Glucose-Capillary: 177 mg/dL — ABNORMAL HIGH (ref 70–99)
Glucose-Capillary: 171 mg/dL — ABNORMAL HIGH (ref 70–99)

## 2014-05-28 LAB — PROTIME-INR
INR: 2.69 — ABNORMAL HIGH (ref 0.00–1.49)
PROTHROMBIN TIME: 28.8 s — AB (ref 11.6–15.2)

## 2014-05-28 LAB — CARBOXYHEMOGLOBIN
Carboxyhemoglobin: 2 % — ABNORMAL HIGH (ref 0.5–1.5)
METHEMOGLOBIN: 1.2 % (ref 0.0–1.5)
O2 SAT: 74.4 %
Total hemoglobin: 12.3 g/dL — ABNORMAL LOW (ref 13.5–18.0)

## 2014-05-28 LAB — PULMONARY FUNCTION TEST
DL/VA % PRED: 83 %
DL/VA: 3.86 ml/min/mmHg/L
DLCO COR % PRED: 67 %
DLCO COR: 21.7 ml/min/mmHg
DLCO unc % pred: 61 %
DLCO unc: 20 ml/min/mmHg
FEF 25-75 Post: 2.6 L/sec
FEF 25-75 Pre: 1.93 L/sec
FEF2575-%Change-Post: 35 %
FEF2575-%PRED-POST: 90 %
FEF2575-%Pred-Pre: 66 %
FEV1-%Change-Post: 8 %
FEV1-%PRED-POST: 81 %
FEV1-%PRED-PRE: 75 %
FEV1-PRE: 2.67 L
FEV1-Post: 2.9 L
FEV1FVC-%Change-Post: 3 %
FEV1FVC-%PRED-PRE: 97 %
FEV6-%Change-Post: 4 %
FEV6-%Pred-Post: 84 %
FEV6-%Pred-Pre: 80 %
FEV6-Post: 3.8 L
FEV6-Pre: 3.62 L
FEV6FVC-%Change-Post: 0 %
FEV6FVC-%Pred-Post: 104 %
FEV6FVC-%Pred-Pre: 104 %
FVC-%Change-Post: 4 %
FVC-%PRED-PRE: 77 %
FVC-%Pred-Post: 81 %
FVC-Post: 3.82 L
FVC-Pre: 3.65 L
POST FEV1/FVC RATIO: 76 %
POST FEV6/FVC RATIO: 99 %
PRE FEV6/FVC RATIO: 99 %
Pre FEV1/FVC ratio: 73 %
RV % PRED: 150 %
RV: 3.43 L
TLC % PRED: 103 %
TLC: 7.23 L

## 2014-05-28 LAB — BASIC METABOLIC PANEL
ANION GAP: 9 (ref 5–15)
BUN: 36 mg/dL — AB (ref 6–23)
CALCIUM: 8.5 mg/dL (ref 8.4–10.5)
CHLORIDE: 98 mmol/L (ref 96–112)
CO2: 27 mmol/L (ref 19–32)
CREATININE: 1.83 mg/dL — AB (ref 0.50–1.35)
GFR, EST AFRICAN AMERICAN: 44 mL/min — AB (ref >90)
GFR, EST NON AFRICAN AMERICAN: 38 mL/min — AB (ref >90)
Glucose, Bld: 241 mg/dL — ABNORMAL HIGH (ref 70–99)
Potassium: 3.5 mmol/L (ref 3.5–5.1)
Sodium: 134 mmol/L — ABNORMAL LOW (ref 135–145)

## 2014-05-28 LAB — URIC ACID: URIC ACID, SERUM: 9 mg/dL — AB (ref 4.0–7.8)

## 2014-05-28 LAB — LACTATE DEHYDROGENASE: LDH: 176 U/L (ref 94–250)

## 2014-05-28 LAB — HEPATITIS C ANTIBODY: HCV Ab: NEGATIVE

## 2014-05-28 LAB — LIPID PANEL
CHOLESTEROL: 92 mg/dL (ref 0–200)
HDL: 28 mg/dL — ABNORMAL LOW (ref >39)
LDL Cholesterol: 37 mg/dL (ref 0–99)
Total CHOL/HDL Ratio: 3.3 RATIO
Triglycerides: 137 mg/dL (ref <150)
VLDL: 27 mg/dL (ref 0–40)

## 2014-05-28 LAB — ANTITHROMBIN III: AntiThromb III Func: 92 % (ref 75–120)

## 2014-05-28 LAB — HEMOGLOBIN A1C
Hgb A1c MFr Bld: 10.1 % — ABNORMAL HIGH (ref 4.8–5.6)
Mean Plasma Glucose: 243 mg/dL

## 2014-05-28 LAB — HIV ANTIBODY (ROUTINE TESTING W REFLEX): HIV Screen 4th Generation wRfx: NONREACTIVE

## 2014-05-28 LAB — HEPATITIS B CORE ANTIBODY, IGM: Hep B C IgM: NONREACTIVE

## 2014-05-28 LAB — HEPATITIS B SURFACE ANTIGEN: Hepatitis B Surface Ag: NEGATIVE

## 2014-05-28 LAB — T4, FREE: Free T4: 1.08 ng/dL (ref 0.80–1.80)

## 2014-05-28 MED ORDER — KETOROLAC TROMETHAMINE 0.5 % OP SOLN
1.0000 [drp] | Freq: Two times a day (BID) | OPHTHALMIC | Status: DC
  Filled 2014-05-28: qty 3

## 2014-05-28 MED ORDER — MILRINONE IN DEXTROSE 20 MG/100ML IV SOLN: 0.2500 ug/kg/min | INTRAVENOUS | Status: DC

## 2014-05-28 MED ORDER — ALBUTEROL SULFATE (2.5 MG/3ML) 0.083% IN NEBU
2.5000 mg | INHALATION_SOLUTION | Freq: Once | RESPIRATORY_TRACT | Status: AC
  Administered 2014-05-28: 2.5 mg via RESPIRATORY_TRACT

## 2014-05-28 MED ORDER — POTASSIUM CHLORIDE CRYS ER 20 MEQ PO TBCR
40.0000 meq | EXTENDED_RELEASE_TABLET | Freq: Once | ORAL | Status: AC
  Administered 2014-05-28: 40 meq via ORAL
  Filled 2014-05-28: qty 2

## 2014-05-28 MED ORDER — OFLOXACIN 0.3 % OP SOLN
1.0000 [drp] | Freq: Two times a day (BID) | OPHTHALMIC | Status: DC
  Filled 2014-05-28: qty 5

## 2014-05-28 MED ORDER — TORSEMIDE 20 MG PO TABS: 40.0000 mg | ORAL_TABLET | Freq: Every day | ORAL | Status: DC

## 2014-05-28 MED ORDER — METOPROLOL SUCCINATE ER 25 MG PO TB24: 25.0000 mg | ORAL_TABLET | Freq: Two times a day (BID) | ORAL | Status: DC

## 2014-05-28 MED ORDER — POTASSIUM CHLORIDE ER 20 MEQ PO TBCR: 20.0000 meq | EXTENDED_RELEASE_TABLET | Freq: Every day | ORAL | Status: DC

## 2014-05-28 NOTE — Progress Notes (Signed)
Advanced Heart Failure Rounding Note   Subjective:    Admitted from home for milrinone and PICC placement. Also for LVAD pre testing.    Denies SOB   CO-OX  74%   Objective:   Weight Range:  Vital Signs:   Temp:  [97.4 F (36.3 C)-98.5 F (36.9 C)] 97.9 F (36.6 C) (04/19 0300) Pulse Rate:  [60-67] 60 (04/18 1700) Resp:  [13-21] 16 (04/19 0743) BP: (90-150)/(38-83) 129/53 mmHg (04/19 0743) SpO2:  [89 %-100 %] 95 % (04/19 0743) Weight:  [238 lb 1.6 oz (108.001 kg)-238 lb 8.6 oz (108.2 kg)] 238 lb 1.6 oz (108.001 kg) (04/19 0300)    Weight change: Filed Weights   05/27/14 0930 05/28/14 0300  Weight: 238 lb 8.6 oz (108.2 kg) 238 lb 1.6 oz (108.001 kg)    Intake/Output:   Intake/Output Summary (Last 24 hours) at 05/28/14 0750 Last data filed at 05/28/14 0700  Gross per 24 hour  Intake 882.77 ml  Output   1300 ml  Net -417.23 ml     Physical Exam: CVP  General:  Well appearing. No resp difficulty. In Chair HEENT: normal Neck: supple. JVP flat. Carotids 2+ bilat; no bruits. No lymphadenopathy or thryomegaly appreciated. Cor: PMI nondisplaced. Regular rate & rhythm. No rubs, gallops or murmurs. Lungs: clear Abdomen: soft, nontender, nondistended. No hepatosplenomegaly. No bruits or masses. Good bowel sounds. Extremities: no cyanosis, clubbing, rash, edema RUE PICC Neuro: alert & orientedx3, cranial nerves grossly intact. moves all 4 extremities w/o difficulty. Affect pleasant  Telemetry: SR 60s   Labs: Basic Metabolic Panel:  Recent Labs Lab 05/22/14 1028 05/27/14 1126 05/28/14 0330  NA 136 136 134*  K 4.3 4.3 3.5  CL 100 99 98  CO2 30 28 27   GLUCOSE 118* 163* 241*  BUN 43* 35* 36*  CREATININE 2.28* 1.95* 1.83*  CALCIUM 9.1 8.8 8.5  MG  --  2.3  --     Liver Function Tests:  Recent Labs Lab 05/27/14 1126  AST 21  ALT 15  ALKPHOS 63  BILITOT 1.2  PROT 6.7  ALBUMIN 3.4*   No results for input(s): LIPASE, AMYLASE in the last 168 hours. No  results for input(s): AMMONIA in the last 168 hours.  CBC:  Recent Labs Lab 05/27/14 1126  WBC 7.6  NEUTROABS 4.9  HGB 12.1*  HCT 36.9*  MCV 97.4  PLT 207    Cardiac Enzymes: No results for input(s): CKTOTAL, CKMB, CKMBINDEX, TROPONINI in the last 168 hours.  BNP: BNP (last 3 results)  Recent Labs  04/30/14 0830 05/22/14 1029 05/27/14 1126  BNP 780.1* 935.2* 1627.8*    ProBNP (last 3 results)  Recent Labs  10/09/13 0926 11/14/13 0926 01/14/14 1112  PROBNP 1561.0* 1261.0* 5769.0*      Other results:  Imaging: Ct Abdomen Pelvis Wo Contrast  05/27/2014   CLINICAL DATA:  Preoperative evaluation prior to placement of left ventricular assist device  EXAM: CT CHEST, ABDOMEN AND PELVIS WITHOUT CONTRAST  TECHNIQUE: Multidetector CT imaging of the chest, abdomen and pelvis was performed following the standard protocol without IV contrast.  COMPARISON:  No similar prior exam is available at this institution for comparison or on [facility].  FINDINGS: CT CHEST FINDINGS  Mediastinum/Nodes: Left-sided pacer in place. Extensive coronary arterial calcification noted. Mild atheromatous aortic calcification. Great vessels are normal in caliber.  Mild cardiomegaly. Trace pericardial fluid. Pretracheal node measures 1.4 cm image 26. Representative AP window lymph node 1.0 cm image 26. No hilar lymphadenopathy.  Lungs/Pleura: Small bilateral pleural effusions are identified. Associated adjacent presumed compressive atelectasis is noted. Mild central bronchial wall thickening is identified. A few areas of interlobular septal thickening likely indicate mild interstitial edema. No alveolar consolidation, nodule, or mass.  Upper abdomen: Please see dedicated report below  Musculoskeletal: Mild disc degenerative change noted. No compression deformity. No acute intrathoracic osseous abnormality.  CT ABDOMEN AND PELVIS FINDINGS  Lower chest:  Please see dedicated report above  Hepatobiliary: Liver  and gallbladder appear normal.  Pancreas: Normal  Spleen: Normal  Adrenals/Urinary Tract: Mild bilateral nonspecific perinephric fluid. Adrenal glands are normal. No hydroureteronephrosis. 2.1 cm left lower renal pole exophytic lesion measuring 91 Hounsfield units is compatible with a hemorrhagic cyst by Hounsfield unit density criteria.  Stomach/Bowel: No bowel wall thickening or focal segmental dilatation is identified. Appendix appears normal. Stomach is normal in appearance. Colonic diverticuli noted without evidence for diverticulitis.  Vascular/Lymphatic: Moderate atheromatous aortic calcification without aneurysm. No lymphadenopathy.  Reproductive: Prostate appears normal.  Other: Fat containing umbilical hernia.  No free air or fluid.  Musculoskeletal: Lobar spine disc degenerative change noted.  IMPRESSION: Small bilateral pleural effusions and bibasilar minimal interlobular septal thickening likely indicating interstitial edema.  Mild mediastinal lymphadenopathy is most likely reactive in the setting of interstitial pulmonary edema and without any other evidence for systemic malignancy/metastatic disease elsewhere.  No acute intra-abdominal or pelvic pathology or focal abnormality.   Electronically Signed   By: Conchita Paris M.D.   On: 05/27/2014 16:55   Dg Orthopantogram  05/27/2014   CLINICAL DATA:  Preoperative evaluation  EXAM: ORTHOPANTOGRAM/PANORAMIC  COMPARISON:  None  FINDINGS: Scattered dental amalgam.  Suspect fractured LEFT maxillary tooth with missing substance, potentially #1 or #2.  Minimal scattered periodontal lucencies.  Diffuse osseous demineralization.  No other focal mandibular or maxillary abnormalities.  IMPRESSION: Osseous demineralization with minimal scattered periodontal lucency.  Suspected fracture with missing substance at a LEFT maxillary molar, question tooth #1 or #2.   Electronically Signed   By: Lavonia Dana M.D.   On: 05/27/2014 16:56   Ct Chest Wo  Contrast  05/27/2014   CLINICAL DATA:  Preoperative evaluation prior to placement of left ventricular assist device  EXAM: CT CHEST, ABDOMEN AND PELVIS WITHOUT CONTRAST  TECHNIQUE: Multidetector CT imaging of the chest, abdomen and pelvis was performed following the standard protocol without IV contrast.  COMPARISON:  No similar prior exam is available at this institution for comparison or on [facility].  FINDINGS: CT CHEST FINDINGS  Mediastinum/Nodes: Left-sided pacer in place. Extensive coronary arterial calcification noted. Mild atheromatous aortic calcification. Great vessels are normal in caliber.  Mild cardiomegaly. Trace pericardial fluid. Pretracheal node measures 1.4 cm image 26. Representative AP window lymph node 1.0 cm image 26. No hilar lymphadenopathy.  Lungs/Pleura: Small bilateral pleural effusions are identified. Associated adjacent presumed compressive atelectasis is noted. Mild central bronchial wall thickening is identified. A few areas of interlobular septal thickening likely indicate mild interstitial edema. No alveolar consolidation, nodule, or mass.  Upper abdomen: Please see dedicated report below  Musculoskeletal: Mild disc degenerative change noted. No compression deformity. No acute intrathoracic osseous abnormality.  CT ABDOMEN AND PELVIS FINDINGS  Lower chest:  Please see dedicated report above  Hepatobiliary: Liver and gallbladder appear normal.  Pancreas: Normal  Spleen: Normal  Adrenals/Urinary Tract: Mild bilateral nonspecific perinephric fluid. Adrenal glands are normal. No hydroureteronephrosis. 2.1 cm left lower renal pole exophytic lesion measuring 91 Hounsfield units is compatible with a hemorrhagic cyst by Hounsfield  unit density criteria.  Stomach/Bowel: No bowel wall thickening or focal segmental dilatation is identified. Appendix appears normal. Stomach is normal in appearance. Colonic diverticuli noted without evidence for diverticulitis.  Vascular/Lymphatic: Moderate  atheromatous aortic calcification without aneurysm. No lymphadenopathy.  Reproductive: Prostate appears normal.  Other: Fat containing umbilical hernia.  No free air or fluid.  Musculoskeletal: Lobar spine disc degenerative change noted.  IMPRESSION: Small bilateral pleural effusions and bibasilar minimal interlobular septal thickening likely indicating interstitial edema.  Mild mediastinal lymphadenopathy is most likely reactive in the setting of interstitial pulmonary edema and without any other evidence for systemic malignancy/metastatic disease elsewhere.  No acute intra-abdominal or pelvic pathology or focal abnormality.   Electronically Signed   By: Conchita Paris M.D.   On: 05/27/2014 16:55   Dg Chest Port 1 View  05/27/2014   CLINICAL DATA:  PICC line placement.  EXAM: PORTABLE CHEST - 1 VIEW  COMPARISON:  Two-view chest 02/01/2014  FINDINGS: The heart size is exaggerated by low lung volumes. Mild pulmonary vascular congestion is present. Pacing wires and AICD are stable in position. An a right-sided PICC line is in place. The tip is just above the cavoatrial junction. There is no pneumothorax. Mild dependent atelectasis is present bilaterally. The lungs are otherwise clear.  IMPRESSION: 1. Borderline cardiomegaly and mild pulmonary vascular congestion. 2. Interval placement of right-sided PICC line. The tip is just above the cavoatrial junction.   Electronically Signed   By: San Morelle M.D.   On: 05/27/2014 12:25      Medications:     Scheduled Medications: . amiodarone  200 mg Oral BID  . digoxin  0.0625 mg Oral Daily  . DULoxetine  60 mg Oral BID  . hydrALAZINE  25 mg Oral TID  . insulin aspart  30 Units Subcutaneous TID AC  . insulin glargine  32 Units Subcutaneous QHS  . isosorbide mononitrate  30 mg Oral Daily  . ketorolac  1 drop Left Eye BID  . levothyroxine  50 mcg Oral QAC breakfast  . metoprolol succinate  25 mg Oral BID AC  . multivitamin with minerals  1 tablet  Oral Daily  . ofloxacin  1 drop Both Eyes BID  . rivaroxaban  15 mg Oral Q supper  . senna  1 tablet Oral QHS  . sodium chloride  10-40 mL Intracatheter Q12H  . sodium chloride  3 mL Intravenous Q12H  . spironolactone  25 mg Oral Daily  . torsemide  40 mg Oral Daily     Infusions: . milrinone 0.25 mcg/kg/min (05/27/14 2253)     PRN Medications:  sodium chloride, acetaminophen, ALPRAZolam, ondansetron (ZOFRAN) IV, oxyCODONE-acetaminophen **AND** oxyCODONE, sodium chloride, sodium chloride   Assessment/Plan    1. Chronic Systolic HF- nonischemic but Cardiolite in 12/15 showed apical scar (no ischemia). EF 20-25% with normal RV size and systolic function on 13/24 echo. Has [employer] ICD . 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 CPX in 4/16 showed severe functional impairment but was submaximal. NYHA IIIB. Today we will place PICC for milrinone 0.25 mcg.  CO-OX after milrinone started. Will need home milrinone at 0.25 mcg.  - Can continue Toprol XL 25 bid. Continue dig 0.125 mg daily check dig level --> 0.6 .  - Continue hydralazine 25 mg tid and imdur 30 mg daily.  - No ace or spiro with ckd.  2. PAF:  Maintaining NSR. On amiodarone 200 mg twice a day and Xarelto 15 mg daily 3. CKD: No Ace or Spiro. Renal function a little better.   4. CAD: Cardiolite showed large apical scar. No chest pain. Continue statin. Not on ASA given Xarelto use. If creatinine improves significantly on milrinone, may do coronary angiography with RHC in the future. However, would like to avoid the dye load if possible. 5. DM2: Continue home regimen. Check hgb A1C. Sliding scale ordered 6. Depression/Anxiety: Continue home regimen  7. Obesity:   Disposition- Home today after PFTs and Home Milrinone set up. Will need milrinone 0.25 mcg. Home Health HF orders placed.   Length  of Stay: 1  CLEGG,AMY NP-C  05/28/2014, 7:50 AM  Advanced Heart Failure Team Pager [phone] (M-F; [place])  Please contact [facility] for night-coverage after hours (4p -7a ) and weekends on [email or web address]  Patient seen with NP, agree with the above note.  He is doing well today, remains in NSR on milrinone (not pacing).  Creatinine better.  Co-ox improved, he feels better.  CVP not elevated.  He will be getting PFTs today and then can go home later on today with home milrinone.  He will need followup in 1 week in CHF clinic and weekly labs.  He will continue his current home medication regimen, would leave on Toprol XL + amiodarone for now given propensity towards atrial fibrillation.  Will plan RHC in 1 month, will follow creatinine and discuss with colleagues regarding coronary angiography.  Plan to work towards LVAD.    Loralie Champagne 05/28/2014 8:34 AM

## 2014-05-28 NOTE — Consult Note (Signed)
[address]       [place] [address]             [phone]      Cardiothoracic Surgery Consultation   Reason for Consult: Non-ischemic cardiomyopathy with NYHA Class IIIB heartfailure: assess for LVAD Referring Physician: Dr. Loralie Champagne  Larry [name] is an 64 y.o. male.  HPI:   The patient has a history of non-ischemic cardiomyopathy and chronic systolic heart failure for many years. He had a left heart cath in 2009 that showed non-obstructive coronary disease. The first echo that is in [place] from 12/2009 at [facility] showed an EF of 25-30% with global hypokinesis. He reports doing well until about 8-9 months ago when he began having progressive exertional fatigue and tiredness. He denies shortness of breath or chest discomfort. He had a RHC in 09/2013 that showed a low cardiac index of 1.81 with a PA sat of 58%, PAP of 60/21 with a wedge of 21, RA 6, RV 64/7 and PVR of 3.6 WU. His medications were adjusted. An Adenosine Cardiolite in 01/2014 showed a large area of apical scar but no ischemia. He was admitted later that month with acute on chronic systolic heart failure and was noted to be in atrial fibrillation. He was started on amiodarone and improved with IV lasix. He was cardioverted to NSR on 02/14/2014. He was cardioverted again to NSR in 04/2014. His last RHC on 05/08/2014 showed a CI of 1.75 with PA 76/27, PCWP 25, RV 80/12, RA 7, PA sat 56%, PVR 4.62. He had a CPX done 05/17/2014 that showed severe functional limitation primarily due to severe cardiac impairment. He was admitted 05/27/2014 to start Milrinone and continued LVAD work up.  Past Medical History  Diagnosis Date  . Cardiomyopathy     nonischemic (EF 25%)  . CAD (coronary artery disease)     nonobstructive CAD by cath 2009  . Obesity   . CVA (cerebral vascular accident)     CVA 2007 without residual deficit  . 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 2009 with neuropathy and retinopathy  . Depression   . Erectile dysfunction   . DDD (degenerative disc disease)   . OSA (obstructive sleep apnea)   . Monoclonal gammopathy   . CRI (chronic renal insufficiency)     CRI (baseline creatinine 1.6)  . CKD (chronic kidney disease), stage III     moderate  . 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 Hypogonadotropic hypogonadism   . Polycythemia, secondary     improved/resolved  . Hypercholesterolemia   . Back pain     F/B [name]  . Monoclonal gammopathy of unknown significance     per [name]  . 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 Neck pain     F/B [name]  . PAF (paroxysmal atrial fibrillation) 06/04/10    Hx of, has device  . History of diverticulitis of colon   . Type II or unspecified type diabetes mellitus with neurological manifestations, uncontrolled 2000  . Nonproliferative diabetic retinopathy NOS(362.03)   . Ventricular tachycardia 10/25/13    appropriate ICD shock, VT CL 230-240 msec  . Confusion 02/01/2014  . Hypertension   . Anginal pain   . Dysrhythmia   . AICD (automatic cardioverter/defibrillator) present   . Presence of permanent cardiac pacemaker   .  Anxiety   . Shortness of breath dyspnea   . Peripheral vascular disease     2007    Past Surgical History  Procedure Laterality Date  . Gamma knife surgery for pituitary tumor    . Tonsillectomy    . Cardiac defibrillator placement  02/19/10    By JA.   . Right heart catheterization N/A 09/17/2013    Procedure: RIGHT HEART CATH;  Surgeon: [name], MD;  Location: [facility] CATH LAB;  Service: Cardiovascular;  Laterality: N/A;  . Cardiac catheterization    . Brain surgery    . Insert / replace / remove pacemaker    . Cardioversion N/A 02/14/2014    Procedure: CARDIOVERSION;  Surgeon: [name], MD;  Location: [facility] [place];  Service: Cardiovascular;  Laterality: N/A;  . Cardioversion  N/A 05/02/2014    Procedure: CARDIOVERSION;  Surgeon: [name], MD;  Location: [facility] ENDOSCOPY;  Service: Cardiovascular;  Laterality: N/A;  . Right heart catheterization N/A 05/08/2014    Procedure: RIGHT HEART CATH;  Surgeon: [name], MD;  Location: [facility] CATH LAB;  Service: Cardiovascular;  Laterality: N/A;    Family History  Problem Relation Age of Onset  . Lung cancer    . Cancer Mother     skin  . Dementia Mother   . Depression Mother   . Heart disease Father   . Hypertension Father   . Lung cancer Father   . Obesity Father   . Obesity Sister   . Hypertension Sister   . Diabetes Brother   . Hypertension Brother   . Heart disease Brother   . Obesity Brother   . Lung cancer Paternal Uncle   . Diabetes Paternal Uncle     requiring leg amputations     Social History:  reports that he quit smoking about 31 years ago. His smoking use included Cigars. He does not have any smokeless tobacco history on file. He reports that he rarely drinks alcohol now but did in the past. He takes 4 oxycodone ills every day for his chronic back pain.   Lives with his wife who is in good health, reliable, and would be his primary caregiver. He has been on disability due to cervical and lower back spine disease and cardiomyopathy  Allergies:  Allergies  Allergen Reactions  . Bydureon [Exenatide] Other (See Comments)    sweating  . Losartan Potassium Other (See Comments)    insomnia    Medications:  I have reviewed the patient's current medications. Prior to Admission:  Prescriptions prior to admission  Medication Sig Dispense Refill Last Dose  . ALPRAZolam (XANAX) 0.5 MG tablet Take 0.5 mg by mouth 3 (three) times daily as needed for anxiety or sleep.   05/26/2014 at Unknown time  . amiodarone (PACERONE) 200 MG tablet Take 1 tablet (200 mg total) by mouth 2 (two) times daily. 60 tablet 3 05/27/2014 at Unknown time  . atorvastatin (LIPITOR) 40 MG tablet TAKE 1 TABLET DAILY (Patient  taking differently: TAKE 1 TABLET BY MOUTH DAILY) 90 tablet 3 05/27/2014 at Unknown time  . digoxin (LANOXIN) 0.125 MG tablet Take 0.5 tablets (0.0625 mg total) by mouth daily. 90 tablet 3 05/27/2014 at Unknown time  . DULoxetine (CYMBALTA) 30 MG capsule Take 60 mg by mouth 2 (two) times daily.    05/26/2014 at Unknown time  . GLUCAGON EMERGENCY 1 MG injection Inject 1 mg as directed once. For hypotension   05/27/2014 at morning  . hydrALAZINE (APRESOLINE) 25  MG tablet Take 1 tablet (25 mg total) by mouth 3 (three) times daily. 90 tablet 3 05/27/2014 at Unknown time  . insulin aspart (NOVOLOG) 100 UNIT/ML injection Inject 30 Units into the skin 3 (three) times daily before meals.    05/27/2014 at Unknown time  . isosorbide mononitrate (IMDUR) 30 MG 24 hr tablet Take 1 tablet (30 mg total) by mouth daily. 30 tablet 3 05/27/2014 at Unknown time  . ketorolac (ACULAR) 0.5 % ophthalmic solution Place 1 drop into the left eye 2 (two) times daily. Start 1 week prior to surgery  4 05/27/2014 at Unknown time  . LANTUS SOLOSTAR 100 UNIT/ML Solostar Pen Inject 32 Units into the skin at bedtime.    05/26/2014 at Unknown time  . levothyroxine (SYNTHROID, LEVOTHROID) 25 MCG tablet Take 50 mcg by mouth daily before breakfast.   5 05/27/2014 at Unknown time  . Melatonin 10 MG TABS Take 10 mg by mouth at bedtime.   05/26/2014 at Unknown time  . metoprolol succinate (TOPROL-XL) 50 MG 24 hr tablet Take 1 tablet (50 mg total) by mouth daily. Take 50 mg by mouth in the am and take 50 mg by mouth in the pm (Patient taking differently: Take 50 mg by mouth 3 (three) times daily. ) 30 tablet 3 05/27/2014 at Unknown time  . Multiple Vitamin (MULITIVITAMIN WITH MINERALS) TABS Take 1 tablet by mouth daily.   05/27/2014 at Unknown time  . ofloxacin (OCUFLOX) 0.3 % ophthalmic solution Place 1 drop into the right eye 2 (two) times daily. Start 72 hours prior to surgery  1 05/27/2014 at Unknown time  . oxyCODONE-acetaminophen (PERCOCET) 10-325 MG  per tablet Take 1 tablet by mouth every 4 (four) hours as needed for pain. For pain   05/27/2014 at Unknown time  . rivaroxaban (XARELTO) 15 MG TABS tablet Take 1 tablet (15 mg total) by mouth daily with supper. 30 tablet 3 05/26/2014 at Unknown time  . senna (SENOKOT) 8.6 MG tablet Take 1 tablet by mouth at bedtime.    05/26/2014 at Unknown time  . spironolactone (ALDACTONE) 25 MG tablet Take 1 tablet (25 mg total) by mouth daily. 30 tablet 6 05/27/2014 at Unknown time  . [DISCONTINUED] torsemide (DEMADEX) 20 MG tablet Take 2 tabs ($Remov'40mg'MOiIZk$ ) every other day alternating with 3 tabs ($Remov'60mg'pLwqAw$ ) every other day 270 tablet 3 05/27/2014 at Unknown time   Scheduled: . amiodarone  200 mg Oral BID  . digoxin  0.0625 mg Oral Daily  . DULoxetine  60 mg Oral BID  . hydrALAZINE  25 mg Oral TID  . insulin aspart  30 Units Subcutaneous TID AC  . insulin glargine  32 Units Subcutaneous QHS  . isosorbide mononitrate  30 mg Oral Daily  . ketorolac  1 drop Both Eyes BID  . levothyroxine  50 mcg Oral QAC breakfast  . metoprolol succinate  25 mg Oral BID AC  . multivitamin with minerals  1 tablet Oral Daily  . ofloxacin  1 drop Right Eye BID  . rivaroxaban  15 mg Oral Q supper  . senna  1 tablet Oral QHS  . sodium chloride  10-40 mL Intracatheter Q12H  . sodium chloride  3 mL Intravenous Q12H  . torsemide  40 mg Oral Daily   Continuous: . milrinone 0.25 mcg/kg/min (05/28/14 1203)   RNH:AFBXUX chloride, acetaminophen, ALPRAZolam, ondansetron (ZOFRAN) IV, oxyCODONE-acetaminophen **AND** oxyCODONE, sodium chloride, sodium chloride Anti-infectives    None      Results for orders placed or performed  during the hospital encounter of 05/27/14 (from the past 48 hour(s))  MRSA PCR Screening     Status: None   Collection Time: 05/27/14  9:30 AM  Result Value Ref Range   MRSA by PCR NEGATIVE NEGATIVE    Comment:        The GeneXpert MRSA Assay (FDA approved for NASAL specimens only), is one component of  a comprehensive MRSA colonization surveillance program. It is not intended to diagnose MRSA infection nor to guide or monitor treatment for MRSA infections.   CBC WITH DIFFERENTIAL     Status: Abnormal   Collection Time: 05/27/14 11:26 AM  Result Value Ref Range   WBC 7.6 4.0 - 10.5 K/uL   RBC 3.79 (L) 4.22 - 5.81 MIL/uL   Hemoglobin 12.1 (L) 13.0 - 17.0 g/dL   HCT 36.9 (L) 39.0 - 52.0 %   MCV 97.4 78.0 - 100.0 fL   MCH 31.9 26.0 - 34.0 pg   MCHC 32.8 30.0 - 36.0 g/dL   RDW 16.3 (H) 11.5 - 15.5 %   Platelets 207 150 - 400 K/uL   Neutrophils Relative % 64 43 - 77 %   Neutro Abs 4.9 1.7 - 7.7 K/uL   Lymphocytes Relative 21 12 - 46 %   Lymphs Abs 1.6 0.7 - 4.0 K/uL   Monocytes Relative 11 3 - 12 %   Monocytes Absolute 0.8 0.1 - 1.0 K/uL   Eosinophils Relative 3 0 - 5 %   Eosinophils Absolute 0.2 0.0 - 0.7 K/uL   Basophils Relative 1 0 - 1 %   Basophils Absolute 0.1 0.0 - 0.1 K/uL  Comprehensive metabolic panel     Status: Abnormal   Collection Time: 05/27/14 11:26 AM  Result Value Ref Range   Sodium 136 135 - 145 mmol/L   Potassium 4.3 3.5 - 5.1 mmol/L   Chloride 99 96 - 112 mmol/L   CO2 28 19 - 32 mmol/L   Glucose, Bld 163 (H) 70 - 99 mg/dL   BUN 35 (H) 6 - 23 mg/dL   Creatinine, Ser 1.95 (H) 0.50 - 1.35 mg/dL   Calcium 8.8 8.4 - 10.5 mg/dL   Total Protein 6.7 6.0 - 8.3 g/dL   Albumin 3.4 (L) 3.5 - 5.2 g/dL   AST 21 0 - 37 U/L   ALT 15 0 - 53 U/L   Alkaline Phosphatase 63 39 - 117 U/L   Total Bilirubin 1.2 0.3 - 1.2 mg/dL   GFR calc non Af Amer 35 (L) >90 mL/min   GFR calc Af Amer 41 (L) >90 mL/min    Comment: (NOTE) The eGFR has been calculated using the CKD EPI equation. This calculation has not been validated in all clinical situations. eGFR's persistently <90 mL/min signify possible Chronic Kidney Disease.    Anion gap 9 5 - 15  Magnesium     Status: None   Collection Time: 05/27/14 11:26 AM  Result Value Ref Range   Magnesium 2.3 1.5 - 2.5 mg/dL  Brain  natriuretic peptide     Status: Abnormal   Collection Time: 05/27/14 11:26 AM  Result Value Ref Range   B Natriuretic Peptide 1627.8 (H) 0.0 - 100.0 pg/mL  Digoxin level     Status: Abnormal   Collection Time: 05/27/14 11:26 AM  Result Value Ref Range   Digoxin Level 0.6 (L) 0.8 - 2.0 ng/mL  TSH     Status: Abnormal   Collection Time: 05/27/14 11:26 AM  Result Value Ref Range  TSH 10.120 (H) 0.350 - 4.500 uIU/mL  Hemoglobin A1c     Status: Abnormal   Collection Time: 05/27/14 11:26 AM  Result Value Ref Range   Hgb A1c MFr Bld 10.1 (H) 4.8 - 5.6 %    Comment: (NOTE)         Pre-diabetes: 5.7 - 6.4         Diabetes: >6.4         Glycemic control for adults with diabetes: <7.0    Mean Plasma Glucose 243 mg/dL    Comment: (NOTE) Performed At: [facility] [place], [place] [id number] Lindon Romp MD [id number]   Glucose, capillary     Status: Abnormal   Collection Time: 05/27/14 11:57 AM  Result Value Ref Range   Glucose-Capillary 131 (H) 70 - 99 mg/dL   Comment 1 Capillary Specimen   Glucose, capillary     Status: None   Collection Time: 05/27/14  5:26 PM  Result Value Ref Range   Glucose-Capillary 76 70 - 99 mg/dL  Type and screen     Status: None   Collection Time: 05/27/14  8:00 PM  Result Value Ref Range   ABO/RH(D) A POS    Antibody Screen NEG    Sample Expiration 05/30/2014   ABO/Rh     Status: None   Collection Time: 05/27/14  8:00 PM  Result Value Ref Range   ABO/RH(D) A POS   Glucose, capillary     Status: None   Collection Time: 05/27/14  9:04 PM  Result Value Ref Range   Glucose-Capillary 93 70 - 99 mg/dL   Comment 1 Capillary Specimen   Basic metabolic panel     Status: Abnormal   Collection Time: 05/28/14  3:30 AM  Result Value Ref Range   Sodium 134 (L) 135 - 145 mmol/L   Potassium 3.5 3.5 - 5.1 mmol/L    Comment: DELTA CHECK NOTED   Chloride 98 96 - 112 mmol/L   CO2 27 19 - 32 mmol/L   Glucose, Bld 241 (H) 70 - 99  mg/dL   BUN 36 (H) 6 - 23 mg/dL   Creatinine, Ser 1.83 (H) 0.50 - 1.35 mg/dL   Calcium 8.5 8.4 - 10.5 mg/dL   GFR calc non Af Amer 38 (L) >90 mL/min   GFR calc Af Amer 44 (L) >90 mL/min    Comment: (NOTE) The eGFR has been calculated using the CKD EPI equation. This calculation has not been validated in all clinical situations. eGFR's persistently <90 mL/min signify possible Chronic Kidney Disease.    Anion gap 9 5 - 15  Lactate dehydrogenase     Status: None   Collection Time: 05/28/14  3:30 AM  Result Value Ref Range   LDH 176 94 - 250 U/L  Uric acid     Status: Abnormal   Collection Time: 05/28/14  3:30 AM  Result Value Ref Range   Uric Acid, Serum 9.0 (H) 4.0 - 7.8 mg/dL  T4, free     Status: None   Collection Time: 05/28/14  3:30 AM  Result Value Ref Range   Free T4 1.08 0.80 - 1.80 ng/dL    Comment: Performed at [facility]  Lipid panel     Status: Abnormal   Collection Time: 05/28/14  3:30 AM  Result Value Ref Range   Cholesterol 92 0 - 200 mg/dL   Triglycerides 137 <150 mg/dL   HDL 28 (L) >39 mg/dL   Total CHOL/HDL Ratio  3.3 RATIO   VLDL 27 0 - 40 mg/dL   LDL Cholesterol 37 0 - 99 mg/dL    Comment:        Total Cholesterol/HDL:CHD Risk Coronary Heart Disease Risk Table                     Men   Women  1/2 Average Risk   3.4   3.3  Average Risk       5.0   4.4  2 X Average Risk   9.6   7.1  3 X Average Risk  23.4   11.0        Use the calculated Patient Ratio above and the CHD Risk Table to determine the patient's CHD Risk.        ATP III CLASSIFICATION (LDL):  <100     mg/dL   Optimal  100-129  mg/dL   Near or Above                    Optimal  130-159  mg/dL   Borderline  160-189  mg/dL   High  >190     mg/dL   Very High   Antithrombin III     Status: None   Collection Time: 05/28/14  3:30 AM  Result Value Ref Range   AntiThromb III Func 92 75 - 120 %  Protime-INR     Status: Abnormal   Collection Time: 05/28/14  3:30 AM  Result Value Ref  Range   Prothrombin Time 28.8 (H) 11.6 - 15.2 seconds   INR 2.69 (H) 0.00 - 1.49  HIV antibody     Status: None   Collection Time: 05/28/14  3:30 AM  Result Value Ref Range   HIV Screen 4th Generation wRfx Non Reactive Non Reactive    Comment: (NOTE) Performed At: [facility] [address] [place], [place] [id number] Lindon Romp MD [id number]   Hepatitis B core antibody, IgM     Status: None   Collection Time: 05/28/14  3:30 AM  Result Value Ref Range   Hep B C IgM NON REACTIVE NON REACTIVE    Comment: (NOTE) High levels of Hepatitis B Core IgM antibody are detectable during the acute stage of Hepatitis B. This antibody is used to differentiate current from past HBV infection. Performed at [facility]   Hepatitis B surface antigen     Status: None   Collection Time: 05/28/14  3:30 AM  Result Value Ref Range   Hepatitis B Surface Ag NEGATIVE NEGATIVE    Comment: Performed at [facility]  Hepatitis C antibody     Status: None   Collection Time: 05/28/14  3:30 AM  Result Value Ref Range   HCV Ab NEGATIVE NEGATIVE    Comment: Performed at [facility]     Status: Abnormal   Collection Time: 05/28/14  3:55 AM  Result Value Ref Range   Total hemoglobin 12.3 (L) 13.5 - 18.0 g/dL   O2 Saturation 74.4 %   Carboxyhemoglobin 2.0 (H) 0.5 - 1.5 %   Methemoglobin 1.2 0.0 - 1.5 %  Glucose, capillary     Status: Abnormal   Collection Time: 05/28/14  7:55 AM  Result Value Ref Range   Glucose-Capillary 177 (H) 70 - 99 mg/dL   Comment 1 Capillary Specimen   Glucose, capillary     Status: Abnormal   Collection Time: 05/28/14 11:53 AM  Result Value Ref Range  Glucose-Capillary 171 (H) 70 - 99 mg/dL   Comment 1 Capillary Specimen     Ct Abdomen Pelvis Wo Contrast  05/27/2014   CLINICAL DATA:  Preoperative evaluation prior to placement of left ventricular assist device  EXAM: CT CHEST, ABDOMEN AND PELVIS WITHOUT CONTRAST   TECHNIQUE: Multidetector CT imaging of the chest, abdomen and pelvis was performed following the standard protocol without IV contrast.  COMPARISON:  No similar prior exam is available at this institution for comparison or on [facility].  FINDINGS: CT CHEST FINDINGS  Mediastinum/Nodes: Left-sided pacer in place. Extensive coronary arterial calcification noted. Mild atheromatous aortic calcification. Great vessels are normal in caliber.  Mild cardiomegaly. Trace pericardial fluid. Pretracheal node measures 1.4 cm image 26. Representative AP window lymph node 1.0 cm image 26. No hilar lymphadenopathy.  Lungs/Pleura: Small bilateral pleural effusions are identified. Associated adjacent presumed compressive atelectasis is noted. Mild central bronchial wall thickening is identified. A few areas of interlobular septal thickening likely indicate mild interstitial edema. No alveolar consolidation, nodule, or mass.  Upper abdomen: Please see dedicated report below  Musculoskeletal: Mild disc degenerative change noted. No compression deformity. No acute intrathoracic osseous abnormality.  CT ABDOMEN AND PELVIS FINDINGS  Lower chest:  Please see dedicated report above  Hepatobiliary: Liver and gallbladder appear normal.  Pancreas: Normal  Spleen: Normal  Adrenals/Urinary Tract: Mild bilateral nonspecific perinephric fluid. Adrenal glands are normal. No hydroureteronephrosis. 2.1 cm left lower renal pole exophytic lesion measuring 91 Hounsfield units is compatible with a hemorrhagic cyst by Hounsfield unit density criteria.  Stomach/Bowel: No bowel wall thickening or focal segmental dilatation is identified. Appendix appears normal. Stomach is normal in appearance. Colonic diverticuli noted without evidence for diverticulitis.  Vascular/Lymphatic: Moderate atheromatous aortic calcification without aneurysm. No lymphadenopathy.  Reproductive: Prostate appears normal.  Other: Fat containing umbilical hernia.  No free air or  fluid.  Musculoskeletal: Lobar spine disc degenerative change noted.  IMPRESSION: Small bilateral pleural effusions and bibasilar minimal interlobular septal thickening likely indicating interstitial edema.  Mild mediastinal lymphadenopathy is most likely reactive in the setting of interstitial pulmonary edema and without any other evidence for systemic malignancy/metastatic disease elsewhere.  No acute intra-abdominal or pelvic pathology or focal abnormality.   Electronically Signed   By: Conchita Paris M.D.   On: 05/27/2014 16:55   Dg Orthopantogram  05/27/2014   CLINICAL DATA:  Preoperative evaluation  EXAM: ORTHOPANTOGRAM/PANORAMIC  COMPARISON:  None  FINDINGS: Scattered dental amalgam.  Suspect fractured LEFT maxillary tooth with missing substance, potentially #1 or #2.  Minimal scattered periodontal lucencies.  Diffuse osseous demineralization.  No other focal mandibular or maxillary abnormalities.  IMPRESSION: Osseous demineralization with minimal scattered periodontal lucency.  Suspected fracture with missing substance at a LEFT maxillary molar, question tooth #1 or #2.   Electronically Signed   By: Lavonia Dana M.D.   On: 05/27/2014 16:56   Ct Chest Wo Contrast  05/27/2014   CLINICAL DATA:  Preoperative evaluation prior to placement of left ventricular assist device  EXAM: CT CHEST, ABDOMEN AND PELVIS WITHOUT CONTRAST  TECHNIQUE: Multidetector CT imaging of the chest, abdomen and pelvis was performed following the standard protocol without IV contrast.  COMPARISON:  No similar prior exam is available at this institution for comparison or on [facility].  FINDINGS: CT CHEST FINDINGS  Mediastinum/Nodes: Left-sided pacer in place. Extensive coronary arterial calcification noted. Mild atheromatous aortic calcification. Great vessels are normal in caliber.  Mild cardiomegaly. Trace pericardial fluid. Pretracheal node measures 1.4 cm image  26. Representative AP window lymph node 1.0 cm image 26. No hilar  lymphadenopathy.  Lungs/Pleura: Small bilateral pleural effusions are identified. Associated adjacent presumed compressive atelectasis is noted. Mild central bronchial wall thickening is identified. A few areas of interlobular septal thickening likely indicate mild interstitial edema. No alveolar consolidation, nodule, or mass.  Upper abdomen: Please see dedicated report below  Musculoskeletal: Mild disc degenerative change noted. No compression deformity. No acute intrathoracic osseous abnormality.  CT ABDOMEN AND PELVIS FINDINGS  Lower chest:  Please see dedicated report above  Hepatobiliary: Liver and gallbladder appear normal.  Pancreas: Normal  Spleen: Normal  Adrenals/Urinary Tract: Mild bilateral nonspecific perinephric fluid. Adrenal glands are normal. No hydroureteronephrosis. 2.1 cm left lower renal pole exophytic lesion measuring 91 Hounsfield units is compatible with a hemorrhagic cyst by Hounsfield unit density criteria.  Stomach/Bowel: No bowel wall thickening or focal segmental dilatation is identified. Appendix appears normal. Stomach is normal in appearance. Colonic diverticuli noted without evidence for diverticulitis.  Vascular/Lymphatic: Moderate atheromatous aortic calcification without aneurysm. No lymphadenopathy.  Reproductive: Prostate appears normal.  Other: Fat containing umbilical hernia.  No free air or fluid.  Musculoskeletal: Lobar spine disc degenerative change noted.  IMPRESSION: Small bilateral pleural effusions and bibasilar minimal interlobular septal thickening likely indicating interstitial edema.  Mild mediastinal lymphadenopathy is most likely reactive in the setting of interstitial pulmonary edema and without any other evidence for systemic malignancy/metastatic disease elsewhere.  No acute intra-abdominal or pelvic pathology or focal abnormality.   Electronically Signed   By: Conchita Paris M.D.   On: 05/27/2014 16:55   Dg Chest Port 1 View  05/27/2014   CLINICAL DATA:   PICC line placement.  EXAM: PORTABLE CHEST - 1 VIEW  COMPARISON:  Two-view chest 02/01/2014  FINDINGS: The heart size is exaggerated by low lung volumes. Mild pulmonary vascular congestion is present. Pacing wires and AICD are stable in position. An a right-sided PICC line is in place. The tip is just above the cavoatrial junction. There is no pneumothorax. Mild dependent atelectasis is present bilaterally. The lungs are otherwise clear.  IMPRESSION: 1. Borderline cardiomegaly and mild pulmonary vascular congestion. 2. Interval placement of right-sided PICC line. The tip is just above the cavoatrial junction.   Electronically Signed   By: San Morelle M.D.   On: 05/27/2014 12:25    Review of Systems  Constitutional: Positive for weight loss and malaise/fatigue. Negative for fever and chills.       Has lost about 40 lbs over the last year by watching diet closely.  HENT:       Has not seen a dentist in years. Was told in the past that he had gingivitis and needed to have his teeth pulled and dentures made but did not do this due to the cost.  Eyes: Negative.   Respiratory:       He probably has exertional dyspnea but refers to it as being exhausted with ambulation of less than 100 ft.  Cardiovascular: Positive for palpitations. Negative for chest pain, orthopnea, claudication, leg swelling and PND.  Gastrointestinal: Negative for abdominal pain, diarrhea, constipation, blood in stool and melena.       Had colonoscopy in the past but probably 7 years ago.  Genitourinary: Negative.   Musculoskeletal: Positive for back pain and neck pain.       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   Neurological: Negative for dizziness and focal weakness.  Endo/Heme/Allergies: Negative.   Psychiatric/Behavioral:  History of depression   Blood pressure 114/63, pulse 60, temperature 98.4 F (36.9 C), temperature source Oral, resp. rate 19, height $RemoveBe'5\' 10"'mSvhXvjww$  (1.778 m), weight  108.001 kg (238 lb 1.6 oz), SpO2 98 %. Physical Exam  Constitutional: He is oriented to person, place, and time.  Obese white male in no distress  HENT:  Head: Normocephalic and atraumatic.  Multiple missing teeth, in poor condition  Eyes: EOM are normal. Pupils are equal, round, and reactive to light.  Neck: Normal range of motion. Neck supple. No JVD present. No thyromegaly present.  Cardiovascular: Normal rate, regular rhythm, normal heart sounds and intact distal pulses.   No murmur heard. Respiratory: Effort normal and breath sounds normal. No respiratory distress. He has no wheezes. He has no rales.  GI: Soft. Bowel sounds are normal. He exhibits no distension. There is no tenderness.  obese  Musculoskeletal: Normal range of motion. He exhibits no edema or tenderness.  Lymphadenopathy:    He has no cervical adenopathy.  Neurological: He is alert and oriented to person, place, and time. He has normal strength. No cranial nerve deficit or sensory deficit.  Skin: Skin is warm and dry. No rash noted. No erythema.  Psychiatric: He has a normal mood and affect.   Referred for: Functional Assessment of CHF  Procedure: This patient underwent staged symptom-limited exercise testing using a individualized bike protocol with expired gas analysis metabolic evaluation during exercise.  Demographics  Age: 76 Ht. (in.) 71 Wt. (lb) 235 BMI: 33.7   Predicted Peak VO2: 22.5 ml/kg/min  Gender: Male Ht (cm) 177.8 Wt. (kg) 106.6    Results  Pre-Exercise PFTs   FVC 3.78 (86%)     FEV1 2.74 (78%)      FEV1/FVC 72 (92%)      MVV 110 (79%)      Exercise Time:  3:11   Watts: 30  RPE: 17  Reason stopped: Patient ended test due to dyspnea (9/10) and chest pain (2/5).   Additional symptoms: Patient had low back pain that was also limiting during exercise (6/10).   Resting HR: 60 Peak HR: 66  (42% age predicted max HR)  BP rest: 96/64 BP peak: 90/58  Peak  VO2: 7.1 (31.6% predicted peak VO2)  VE/VCO2 slope: 36  OUES: 0.84  Peak RER: 0.99  Ventilatory Threshold: Indeterminate   Peak RR 28  Peak Ventilation: 28.4  VE/MVV: 25.8%  PETCO2 at peak: 29  O2pulse:  11 (77% predicted O2pulse)   Interpretation  Notes: Patient gave a good effort although dyspnea and coughing occurred early in the exercise resulting in early test termination. Pulse-oximetry remained 98-100% throughout the exercise. Exercise was performed on a cycle ergometer starting at [facility] and increasing by 10W/min.  ECG: Resting ECG dual chamber paced in sinus rhythm with right bundle branch block and left bifascicular block. BP response was severely blunted (not likely beta-blockade factor). Patient was mostly RV paced pre and during exercise. BP was hypotensive at rest, during exercise and increased above resting during recovery.   PFT: Pre-exercise spirometry demonstrates a very mild obstruction. MVV was just below normal and suggests a mild restrictive pattern.   CPX: Exercise testing with gas exchange demonstrates a serverely reduced peak VO2 of 7.1 ml/kg/min (31.6% of the age/gender/weight matched sedentary norms). The RER of 0.99 indicates a submaximal effort. When adjusted to the patient's ideal body weight of 175.8 lb (79.8 kg) the peak VO2 is 9.5 ml/kg (ibw)/min (30.5% of the ibw-adjusted predicted). The VE/VCO2 slope  is elevated and indicates excessive dead space ventilation. The oxygen uptake efficiency slope (OUES) is severely reduced and is reflective of the patient's measured functional capacity. The VO2 at the ventilatory threshold was indeterminate with the given workload and time of exercise. At peak exercise, the ventilation reached 25.8% of the measured MVV indicating ventilatory reserve remained. The O2pulse (a surrogate for stroke volume) increased at the onset of exercise but remained flat through the exercise at 11 ml/beat (77%  predicted) .    Conclusion: Interpretation of this test is limited by a submaximal effort. Based on available data, exercise testing with gas-exchange demonstrates a severe functional impairment due primarily to a severe cardiac limitation. There was severe chronotropic incompetence with little change in resting and exercise HR and a flat BP response. Consideration should be given to advanced HF therapies.     Test, report and preliminary impression by: Landis Martins, MS, ACSM-RCEP 05/17/2014 4:44 PM  I have edited the conclusion with my interpretation.   Daniel Bensimhon,MD 2:05 PM  -----------------------------------------------------------------------------------------------------------------------------------------------------------          *[address], [place] [address]              [id number]  ------------------------------------------------------------------- Transthoracic Echocardiography  Patient:  Calvyn, Kurtzman MR #:    [id number] Study Date: 02/02/2014 Gender:   M Age:    34 Height:   177.8 cm Weight:   118.8 kg BSA:    2.47 m^2 Pt. Status: Room:    3E21C  ATTENDING  Dola Argyle, MD ORDERING   Cecilie Kicks R REFERRING  [place], [place], [place] SONOGRAPHER Jimmy Reel, RDCS PERFORMING  [facility], Inpatient  cc:  ------------------------------------------------------------------- LV EF: 20% -  25%  ------------------------------------------------------------------- Indications:   CHF - 428.0. Dyspnea 786.09.  ------------------------------------------------------------------- History:  PMH:  Atrial fibrillation. Congestive heart failure. Risk factors: Hypertension. Diabetes mellitus.  Dyslipidemia.  ------------------------------------------------------------------- Study Conclusions  - Left ventricle: The cavity size was moderately to severely dilated. Systolic function was severely reduced. The estimated ejection fraction was in the range of 20% to 25%. Akinesis of the inferior myocardium. - Aorta: The aorta was mildly dilated. - Left atrium: The atrium was severely dilated.  ------------------------------------------------------------------- Labs, prior tests, procedures, and surgery: Permanent pacemaker system implantation.  Transthoracic echocardiography. M-mode, complete 2D, spectral Doppler, and color Doppler. Birthdate: Patient birthdate: 01-15-1951. Age: Patient is 64 yr old. Sex: Gender: male. BMI: 37.6 kg/m^2. Blood pressure:   121/85 Patient status: Inpatient. Study date: Study date: 02/02/2014. Study time: 10:49 AM. Location: Bedside.  -------------------------------------------------------------------  ------------------------------------------------------------------- Left ventricle: The cavity size was moderately to severely dilated. Systolic function was severely reduced. The estimated ejection fraction was in the range of 20% to 25%. Regional wall motion abnormalities:  Akinesis of the inferior myocardium.  ------------------------------------------------------------------- Aortic valve:  Trileaflet; normal thickness, mildly calcified leaflets. Mobility was not restricted. Doppler: Transvalvular velocity was within the normal range. There was no stenosis. There was no regurgitation.  ------------------------------------------------------------------- Aorta: The aorta was mildly dilated. Aortic root: The aortic root was normal in size.  ------------------------------------------------------------------- Mitral valve:  Structurally normal valve.  Mobility was not restricted. Doppler: Transvalvular velocity  was within the normal range. There was no evidence for stenosis. There was no regurgitation.  ------------------------------------------------------------------- Left atrium: The atrium was  severely dilated.  ------------------------------------------------------------------- Right ventricle: The cavity size was normal. Wall thickness was normal. Pacer wire or catheter noted in right ventricle. Systolic function was normal.  ------------------------------------------------------------------- Pulmonic valve:  Not well visualized. The valve appears to be grossly normal.  Doppler: Transvalvular velocity was within the normal range. There was no evidence for stenosis.  ------------------------------------------------------------------- Tricuspid valve: Not well visualized. The valve appears to be grossly normal.  Doppler: Transvalvular velocity was within the normal range. There was no regurgitation.  ------------------------------------------------------------------- Pulmonary artery:  The main pulmonary artery was normal-sized.  ------------------------------------------------------------------- Right atrium: The atrium was normal in size.  ------------------------------------------------------------------- Pericardium: There was no pericardial effusion.  ------------------------------------------------------------------- Systemic veins: Inferior vena cava: The vessel was normal in size.  ------------------------------------------------------------------- Post procedure conclusions Ascending Aorta:  - The aorta was mildly dilated.  ------------------------------------------------------------------- Measurements  Left ventricle             Value    Reference LV ID, ED, PLAX chordal    (H)   65.2 mm   43 - 52 LV ID, ES, PLAX chordal    (H)   58.7 mm   23 - 38 LV fx shortening, PLAX chordal (L)   10  %   >=29 LV  PW thickness, ED          12.4 mm   --------- IVS/LV PW ratio, ED          1.01     <=1.3  Ventricular septum           Value    Reference IVS thickness, ED           12.5 mm   ---------  Aorta                 Value    Reference Aortic root ID, ED           37  mm   ---------  Left atrium              Value    Reference LA ID, A-P, ES             47  mm   --------- LA ID/bsa, A-P             1.9  cm/m^2 <=2.2 LA volume, ES, 1-p A4C         102  ml   --------- LA volume/bsa, ES, 1-p A4C       41.3 ml/m^2 ---------  Systemic veins             Value    Reference Estimated CVP             3   mm Hg ---------  Legend: (L) and (H) mark values outside specified reference range.  ------------------------------------------------------------------- Prepared and Electronically Authenticated by  Ezzard Standing, MD [facility] 2015-12-26T12:10:26  -------------------------------------------------------------------------------------------------------------------------------------------------------  Cardiac Catheterization Procedure Note  Name: TERRAN KLINKE MRN: [id number] DOB: 21-Feb-1950  Procedure: Right Heart Cath  Indication: CHF  Procedural Details: The right brachial area was prepped, draped, and anesthetized with 1% lidocaine. There was a pre-existing peripheral IV in the right brachial area. This was replaced with a 5 [place] sheath. A Swan-Ganz catheter was used for the right heart catheterization. Standard protocol was followed for recording of right heart pressures and sampling of oxygen saturations. Fick cardiac output was calculated. There were no immediate procedural complications. The patient was transferred to the post catheterization recovery area for further  monitoring.  Procedural  Findings: Hemodynamics (mmHg) RA mean 7 RV 80/12 PA 76/27, mean 43 PCWP mean 25  Oxygen saturations: PA 56% AO 93%  Cardiac Output (Fick) 3.89  Cardiac Index (Fick) 1.75 PVR 4.62  Final Conclusions: Elevated left heart filling pressure with severe pulmonary hypertension. 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 I suspect that severe untreated OSA is playing a role. Cardiac index is low, last RHC 1 year ago had index 1.8 so not much different.   Recommendations: We need to consider Mr Colomb for LVAD. He is set up for CPX. I am not going to start him on milrinone at this point, cardiac index is similar to prior 1 year ago and he is now holding NSR. RV-pacing with atrial fibrillation has been a problem, and I am concerned he would be back in atrial fibrillation on milrinone.  - I will begin LVAD workup.  - Increase torsemide to 80 mg daily.  - Has repeat sleep study this week for CPAP titration. This will be very important given elevated PA pressure and severe OSA.   Marca Ancona 05/08/2014, 9:34 AM             Ref Range 8:06 AM    FVC-Pre L 3.65P   FVC-%Pred-Pre % 77P   FVC-Post L 3.82P   FVC-%Pred-Post % 81P   FVC-%Change-Post % 4P   FEV1-Pre L 2.67P   FEV1-%Pred-Pre % 75P   FEV1-Post L 2.90P   FEV1-%Pred-Post % 81P   FEV1-%Change-Post % 8P   FEV6-Pre L 3.62P   FEV6-%Pred-Pre % 80P   FEV6-Post L 3.80P   FEV6-%Pred-Post % 84P   FEV6-%Change-Post % 4P   Pre FEV1/FVC ratio % 73P   FEV1FVC-%Pred-Pre % 97P   Post FEV1/FVC ratio % 76P   FEV1FVC-%Change-Post % 3P   Pre FEV6/FVC Ratio % 99P   FEV6FVC-%Pred-Pre % 104P   Post FEV6/FVC ratio % 99P   FEV6FVC-%Pred-Post % 104P   FEV6FVC-%Change-Post % 0P   FEF 25-75 Pre L/sec 1.93P   FEF2575-%Pred-Pre % 66P   FEF 25-75 Post L/sec 2.60P   FEF2575-%Pred-Post % 90P   FEF2575-%Change-Post % 35P    RV L 3.43P   RV % pred % 150P   TLC L 7.23P   TLC % pred % 103P   DLCO unc ml/min/mmHg 20.00P   DLCO unc % pred % 61P   DLCO cor ml/min/mmHg 21.70P   DLCO cor % pred % 67P   DL/VA ml/min/mmHg/L 3.38V   DL/VA % pred % 77Q   Resulting Agency [facility]       Specimen Collected: 05/28/14 8:06 AM Last Resulted: 05/28/14 1:35 PM         BMET    Component Value Date/Time   NA 134* 05/28/2014 0330   K 3.5 05/28/2014 0330   CL 98 05/28/2014 0330   CO2 27 05/28/2014 0330   GLUCOSE 241* 05/28/2014 0330   BUN 36* 05/28/2014 0330   CREATININE 1.83* 05/28/2014 0330   CALCIUM 8.5 05/28/2014 0330   GFRNONAA 38* 05/28/2014 0330   GFRAA 44* 05/28/2014 0330    CBC    Component Value Date/Time   WBC 7.6 05/27/2014 1126   RBC 3.79* 05/27/2014 1126   HGB 12.1* 05/27/2014 1126   HCT 36.9* 05/27/2014 1126   PLT 207 05/27/2014 1126   MCV 97.4 05/27/2014 1126   MCH 31.9 05/27/2014 1126   MCHC 32.8 05/27/2014 1126   RDW 16.3* 05/27/2014 1126   LYMPHSABS 1.6 05/27/2014 1126  MONOABS 0.8 05/27/2014 1126   EOSABS 0.2 05/27/2014 1126   BASOSABS 0.1 05/27/2014 1126    CMP Latest Ref Rng 05/28/2014 05/27/2014 05/22/2014  Glucose 70 - 99 mg/dL 241(H) 163(H) 118(H)  BUN 6 - 23 mg/dL 36(H) 35(H) 43(H)  Creatinine 0.50 - 1.35 mg/dL 1.83(H) 1.95(H) 2.28(H)  Sodium 135 - 145 mmol/L 134(L) 136 136  Potassium 3.5 - 5.1 mmol/L 3.5 4.3 4.3  Chloride 96 - 112 mmol/L 98 99 100  CO2 19 - 32 mmol/L $RemoveB'27 28 30  'wnbMNdBY$ Calcium 8.4 - 10.5 mg/dL 8.5 8.8 9.1  Total Protein 6.0 - 8.3 g/dL - 6.7 -  Total Bilirubin 0.3 - 1.2 mg/dL - 1.2 -  Alkaline Phos 39 - 117 U/L - 63 -  AST 0 - 37 U/L - 21 -  ALT 0 - 53 U/L - 15 -     HgbA1c 10.1 on 05/27/2014  ------------------------------------------------------------------------------------------------------------------------------------------------------  CLINICAL DATA: Preoperative evaluation prior to placement of left ventricular assist  device  EXAM: CT CHEST, ABDOMEN AND PELVIS WITHOUT CONTRAST  TECHNIQUE: Multidetector CT imaging of the chest, abdomen and pelvis was performed following the standard protocol without IV contrast.  COMPARISON: No similar prior exam is available at this institution for comparison or on [facility].  FINDINGS: CT CHEST FINDINGS  Mediastinum/Nodes: Left-sided pacer in place. Extensive coronary arterial calcification noted. Mild atheromatous aortic calcification. Great vessels are normal in caliber.  Mild cardiomegaly. Trace pericardial fluid. Pretracheal node measures 1.4 cm image 26. Representative AP window lymph node 1.0 cm image 26. No hilar lymphadenopathy.  Lungs/Pleura: Small bilateral pleural effusions are identified. Associated adjacent presumed compressive atelectasis is noted. Mild central bronchial wall thickening is identified. A few areas of interlobular septal thickening likely indicate mild interstitial edema. No alveolar consolidation, nodule, or mass.  Upper abdomen: Please see dedicated report below  Musculoskeletal: Mild disc degenerative change noted. No compression deformity. No acute intrathoracic osseous abnormality.  CT ABDOMEN AND PELVIS FINDINGS  Lower chest: Please see dedicated report above  Hepatobiliary: Liver and gallbladder appear normal.  Pancreas: Normal  Spleen: Normal  Adrenals/Urinary Tract: Mild bilateral nonspecific perinephric fluid. Adrenal glands are normal. No hydroureteronephrosis. 2.1 cm left lower renal pole exophytic lesion measuring 91 Hounsfield units is compatible with a hemorrhagic cyst by Hounsfield unit density criteria.  Stomach/Bowel: No bowel wall thickening or focal segmental dilatation is identified. Appendix appears normal. Stomach is normal in appearance. Colonic diverticuli noted without evidence for diverticulitis.  Vascular/Lymphatic: Moderate atheromatous aortic  calcification without aneurysm. No lymphadenopathy.  Reproductive: Prostate appears normal.  Other: Fat containing umbilical hernia. No free air or fluid.  Musculoskeletal: Lobar spine disc degenerative change noted.  IMPRESSION: Small bilateral pleural effusions and bibasilar minimal interlobular septal thickening likely indicating interstitial edema.  Mild mediastinal lymphadenopathy is most likely reactive in the setting of interstitial pulmonary edema and without any other evidence for systemic malignancy/metastatic disease elsewhere.  No acute intra-abdominal or pelvic pathology or focal abnormality.   Electronically Signed  By: Conchita Paris M.D.  On: 05/27/2014 16:55  CLINICAL DATA: Confusion.  EXAM: CT HEAD WITHOUT CONTRAST  TECHNIQUE: Contiguous axial images were obtained from the base of the skull through the vertex without intravenous contrast.  COMPARISON: Head CT scan 05/19/2013.  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 There is no evidence of acute abnormality including infarct, hemorrhage, mass lesion, mass effect, midline shift or abnormal extra-axial fluid collection. The calvarium is intact.  Imaged paranasal sinuses and mastoid air cells are clear.  IMPRESSION: No acute abnormality.  Pituitary adenoma, chronic microvascular ischemic change and remote right MCA territory infarct, unchanged.   Electronically Signed  By: Inge Rise M.D.  On: 02/01/2014 12:39   -------------------------------------------------------------------------------------------------------------------------------------------------------------   CLINICAL DATA: Preoperative evaluation  EXAM: ORTHOPANTOGRAM/PANORAMIC  COMPARISON: None  FINDINGS: Scattered dental amalgam.  Suspect fractured LEFT maxillary tooth with missing  substance, potentially #1 or #2.  Minimal scattered periodontal lucencies.  Diffuse osseous demineralization.  No other focal mandibular or maxillary abnormalities.  IMPRESSION: Osseous demineralization with minimal scattered periodontal lucency.  Suspected fracture with missing substance at a LEFT maxillary molar, question tooth #1 or #2.   Electronically Signed  By: Lavonia Dana M.D.  On: 05/27/2014 16:56       Assessment/Plan:  He has a long history of non-ischemic cardiomyopathy with progressive symptoms of exertional fatigue and dyspnea and tiredness that are now NYHA class IIIB. He has severe functional limitation on CPX due to cardiac disease and his last RHC showed low cardiac output and pulmonary hypertension. He has been started on Milrinone and says that he feels better already with more energy. He is fairly young and would be an acceptable LVAD candidate for destination therapy although at some increased risk due to obesity, poorly controlled DM, stage III chronic kidney disease and possible sleep apnea. For these same reasons I don't think he would be a heart transplant candidate. Hopefully a period on home Milrinone will allow him to be tuned up with better diabetes control, improvement in his kidney function and more mobilization. He does have fractured molar and poor dentition overall and has not seen a dentist in years so he should be seen by Dr. Enrique Sack for dental evaluation. We need to get his records from prior colonoscopy. His chest CT shows severe left main and proximal coronary artery calcification but he only had apical scar on his Cardiolyte exam in December. If his creatinine improves a left heart cath could be done but we will need to think about that. He does have calcified plaque in the ascending aorta that may make clamping and anastomosis of the outflow graft more difficult and increase stroke risk. I spent a long time discussing LVAD therapy and the  surgical procedure with the patient and his wife. I answered all of their questions and told them that I would see them again in the heart failure clinic when they return. They are both very interested in pursuing LVAD therapy if the team decides that he is a candidate.  I spent 80 minutes performing this consultation and > 50% of this time was spent face to face counseling and coordinating the care of this patient's chronic systolic heart failure.  Gaye Pollack 05/28/2014, 3:25 PM

## 2014-05-28 NOTE — Consult Note (Signed)
Patient Larry [name]:VVOHY A Fulfer      DOB: 01-20-1951      WVP:[id number]     Consult Note from the Palliative Medicine Team at [facility] Requested by: Dr. Aundra Dubin     PCP: [name], MD Reason for Consultation: VAD eval     Phone Number:[phone]  Assessment of patients Current state: I met today with Larry [name] and his wife at bedside. We discussed VAD risks/benefits/expectations. Wife is very tearful as we discuss advance directives as she says her brother just passed about a month ago and they had to make the decision to take him off of life support. This is all very fresh in their minds. I offered support as they told me what a bad experience they had with him. I explained that if Larry [name] were to find himself in that situation at any point in time it would not have to be like that - encouraged them that myself as well as the heart failure team can be very instrumental/supportive to make end of life as comfortable and peaceful as possible. With this recent experience they are hesitant to make decisions and Larry [name] is worried if they complete a Advance Directive care may be terminated or delayed and I explained that the purpose is for the patient to have a voice in the direction of care and make wishes known and also may take some of the burden off her shoulders for this to be his decision. Encouraged her to express her wishes as well to normalize the conversation. Larry [name] is very quite during this conversation. 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    Goals of Care: 1.  Code Status: FULL   2. Disposition: Home when stable   3. Symptom Management:   1. Chronic lower back pain: Continue percocet 10-325 mg every 4 hours prn. Continue cymbalta.  2. Sleep Disturbance: Hoping that when he is set up with CPAP  sleep will improve - f/u with pulmonary for recommendations.  3. Anxiety: Agree with alprazolam 0.5 mg TID prn.  4. Dyspnea: Continue management/milrinone per heart failure team.   4. Psychosocial: Emotional support provided to patient and family at bedside. They have one daughter 73 yo living in [place].    Patient Documents Completed or Given: Document Given Completed  Advanced Directives Pkt yes   MOST    DNR    Gone from [facility]    Hard Choices      Brief HPI: 64 year old with a chronic systolic CHF (nonischemic cardiomyopathy), CKD, prior CVA, and paroxysmal atrial fibrillation. He has a Research officer, political party ICD. He had LHC in 2009 with nonobstructive disease. Last echo in 12/15 showed EF 20-25% with normal RV size and systolic function.Admitted for initiation of milrinone and pre LVAD testing. PICC line placed in the RUE by IV therapy and milrinone was started at 0.25 mcg. Increased CO-OX and cardiac output noted. From HF perspective he remained stable. Plan home with milrinone and continued evaluation for VAD. PMH reviewed.    ROS: + anxiety, + sleep disturbance, + chronic lower back pain    PMH:  Past Medical History  Diagnosis Date  . Cardiomyopathy     nonischemic (EF 25%)  . CAD (coronary artery disease)     nonobstructive CAD  by cath 2009  . Obesity   . CVA (cerebral vascular accident)     CVA 2007 without residual deficit  . Pituitary tumor      (nonfunctionging pituitary microadenoma) s/p gamma knife surgery at [facility] 2009 with neuropathy and retinopathy  . Depression   . Erectile dysfunction   . DDD (degenerative disc disease)   . OSA (obstructive sleep apnea)   . Monoclonal gammopathy   . CRI (chronic renal insufficiency)     CRI (baseline creatinine 1.6)  . CKD (chronic kidney disease), stage III     moderate  . 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 Hypogonadotropic hypogonadism   . Polycythemia, secondary     improved/resolved  . Hypercholesterolemia    . Back pain     F/B [name]  . Monoclonal gammopathy of unknown significance     per [name]  . 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 Neck pain     F/B [name]  . PAF (paroxysmal atrial fibrillation) 06/04/10    Hx of, has device  . History of diverticulitis of colon   . Type II or unspecified type diabetes mellitus with neurological manifestations, uncontrolled 2000  . Nonproliferative diabetic retinopathy NOS(362.03)   . Ventricular tachycardia 10/25/13    appropriate ICD shock, VT CL 230-240 msec  . Confusion 02/01/2014  . Hypertension   . Anginal pain   . Dysrhythmia   . AICD (automatic cardioverter/defibrillator) present   . Presence of permanent cardiac pacemaker   . Anxiety   . Shortness of breath dyspnea   . Peripheral vascular disease     2007     PSH: Past Surgical History  Procedure Laterality Date  . Gamma knife surgery for pituitary tumor    . Tonsillectomy    . Cardiac defibrillator placement  02/19/10    By JA.   . Right heart catheterization N/A 09/17/2013    Procedure: RIGHT HEART CATH;  Surgeon: [name], MD;  Location: [facility] CATH LAB;  Service: Cardiovascular;  Laterality: N/A;  . Cardiac catheterization    . Brain surgery    . Insert / replace / remove pacemaker    . Cardioversion N/A 02/14/2014    Procedure: CARDIOVERSION;  Surgeon: [name], MD;  Location: [place];  Service: Cardiovascular;  Laterality: N/A;  . Cardioversion N/A 05/02/2014    Procedure: CARDIOVERSION;  Surgeon: [name], MD;  Location: [facility] ENDOSCOPY;  Service: Cardiovascular;  Laterality: N/A;  . Right heart catheterization N/A 05/08/2014    Procedure: RIGHT HEART CATH;  Surgeon: [name], MD;  Location: [facility] CATH LAB;  Service: Cardiovascular;  Laterality: N/A;   I have reviewed the FH and SH and  If appropriate update it with new information. Allergies  Allergen Reactions  . Bydureon [Exenatide] Other (See  Comments)    sweating  . Losartan Potassium Other (See Comments)    insomnia   Scheduled Meds: . amiodarone  200 mg Oral BID  . digoxin  0.0625 mg Oral Daily  . DULoxetine  60 mg Oral BID  . hydrALAZINE  25 mg Oral TID  . insulin aspart  30 Units Subcutaneous TID AC  . insulin glargine  32 Units Subcutaneous QHS  . isosorbide mononitrate  30 mg Oral Daily  . ketorolac  1 drop Both Eyes BID  . levothyroxine  50 mcg Oral QAC breakfast  . metoprolol succinate  25 mg Oral BID  AC  . multivitamin with minerals  1 tablet Oral Daily  . ofloxacin  1 drop Right Eye BID  . rivaroxaban  15 mg Oral Q supper  . senna  1 tablet Oral QHS  . sodium chloride  10-40 mL Intracatheter Q12H  . sodium chloride  3 mL Intravenous Q12H  . torsemide  40 mg Oral Daily   Continuous Infusions: . milrinone 0.25 mcg/kg/min (05/28/14 1203)   PRN Meds:.sodium chloride, acetaminophen, ALPRAZolam, ondansetron (ZOFRAN) IV, oxyCODONE-acetaminophen **AND** oxyCODONE, sodium chloride, sodium chloride    BP 114/63 mmHg  Pulse 60  Temp(Src) 98.4 F (36.9 C) (Oral)  Resp 19  Ht $R'5\' 10"'Rk$  (1.778 m)  Wt 108.001 kg (238 lb 1.6 oz)  BMI 34.16 kg/m2  SpO2 98%   PPS: 50%   Intake/Output Summary (Last 24 hours) at 05/28/14 1339 Last data filed at 05/28/14 1200  Gross per 24 hour  Intake 863.27 ml  Output    600 ml  Net 263.27 ml    Physical Exam:  General: NAD, sitting up in recliner HEENT:  [place]/AT, no JVD, moist mucous membranes Chest: CTA throughout, no labored breathing, symmetric CVS: RRR Abdomen: Soft, NT, ND Ext: MAE, no edema, warm to touch Neuro: Awake, alert, oriented x 3, affect pleasant but somewhat flat  Labs: CBC    Component Value Date/Time   WBC 7.6 05/27/2014 1126   RBC 3.79* 05/27/2014 1126   HGB 12.1* 05/27/2014 1126   HCT 36.9* 05/27/2014 1126   PLT 207 05/27/2014 1126   MCV 97.4 05/27/2014 1126   MCH 31.9 05/27/2014 1126   MCHC 32.8 05/27/2014 1126   RDW 16.3* 05/27/2014 1126    LYMPHSABS 1.6 05/27/2014 1126   MONOABS 0.8 05/27/2014 1126   EOSABS 0.2 05/27/2014 1126   BASOSABS 0.1 05/27/2014 1126    BMET    Component Value Date/Time   NA 134* 05/28/2014 0330   K 3.5 05/28/2014 0330   CL 98 05/28/2014 0330   CO2 27 05/28/2014 0330   GLUCOSE 241* 05/28/2014 0330   BUN 36* 05/28/2014 0330   CREATININE 1.83* 05/28/2014 0330   CALCIUM 8.5 05/28/2014 0330   GFRNONAA 38* 05/28/2014 0330   GFRAA 44* 05/28/2014 0330    CMP     Component Value Date/Time   NA 134* 05/28/2014 0330   K 3.5 05/28/2014 0330   CL 98 05/28/2014 0330   CO2 27 05/28/2014 0330   GLUCOSE 241* 05/28/2014 0330   BUN 36* 05/28/2014 0330   CREATININE 1.83* 05/28/2014 0330   CALCIUM 8.5 05/28/2014 0330   PROT 6.7 05/27/2014 1126   ALBUMIN 3.4* 05/27/2014 1126   AST 21 05/27/2014 1126   ALT 15 05/27/2014 1126   ALKPHOS 63 05/27/2014 1126   BILITOT 1.2 05/27/2014 1126   GFRNONAA 38* 05/28/2014 0330   GFRAA 44* 05/28/2014 0330    Time In Time Out Total Time Spent with Patient Total Overall Time  0940 1100 62min 33min    Greater than 50%  of this time was spent counseling and coordinating care related to the above assessment and plan.  Vinie Sill, NP Palliative Medicine Team Pager # [phone] (M-F 8a-5p) Team Phone # [phone] (Nights/Weekends)

## 2014-05-28 NOTE — Progress Notes (Signed)
Discharge instructions went over very thoroughly with patient and his wife. Patient discharged home. Home with milrinone.

## 2014-05-28 NOTE — Progress Notes (Signed)
CSW met with patient and wife at bedside to discuss LVAD evaluation. Patient with various tests today and other assessments in progress. CSW discussed completing LVAD assessment on outpatient appointment next week. Patient and wife agreeable to meet with CSW at that time. CSW scheduled outpatient LVAD assessment for next week. Raquel Sarna, [place]

## 2014-05-28 NOTE — Progress Notes (Signed)
UR completed 

## 2014-05-28 NOTE — Discharge Summary (Signed)
Advanced Heart Failure Team  Discharge Summary   Patient ID: Larry [name] MRN: [id number], DOB/AGE: [birth year] 64 y.o. Admit date: 05/27/2014 D/C date:     05/28/2014   Primary Discharge Diagnoses:  1. Chronic Systolic HF- nonischemic but Cardiolite in 12/15 showed apical scar (no ischemia). EF 20-25% with normal RV size and systolic function on 18/84 echo. Has [employer] ICD .  2. PAF: Maintained NSR. On amiodarone 200 mg twice a day and Xarelto 15 mg daily 3. CKD: No Ace or Spiro. 4. CAD: Cardiolite showed large apical scar.  5. DM2: Continue home regimen. Check hgb A1C. Sliding scale ordered 6. Depression/Anxiety: Continue home regimen  7. Obesity  Hospital Course:  Mr Ribeiro is a 64 year old with a historchronic systolic CHF (nonischemic cardiomyopathy), CKD, prior CVA, and paroxysmal atrial fibrillation. Patient says that he has been known to have a low EF for years. He has a Research officer, political party ICD. He had LHC in 2009 with nonobstructive disease. Last echo in 12/15 showed EF 20-25% with normal RV size and systolic function.  Admitted for initiation of milrinone and pre LVAD testing. PICC line placed in the RUE by IV therapy and milrinone was started at 0.25 mcg.  Increased CO-OX and cardiac output noted. From HF perspective he remained stable. He is being discharged on a reduced metoprolol xl 25 mg twice a day and torsemide 40 mg daily (CVP 5).  Also spiro was discontinued with CKD. He will continue on hydralazine and imdur for afterload reduction.    Pre LVAD testing was completed and will be reviewed by Dr Aundra Dubin and Dr Cyndia Bent. He will follow up with HF SW next week to complete evaluation. He will be discussed at [place] next week.   He maintained at NSR and will continue on amiodarone 200 mg twice a day + Xarleto 15 mg daily.   He was referred to [facility] for home milrinone at 0.25 mcg. He will also receive weekly bmet. He will contine to be followed closely in the HF  clinic and has follow up next week.   Discharge Weight Range:  238 pounds.  Discharge Vitals: Blood pressure 114/63, pulse 60, temperature 98.4 F (36.9 C), temperature source Oral, resp. rate 19, height 5\' 10"  (1.778 m), weight 238 lb 1.6 oz (108.001 kg), SpO2 98 %.  Labs: Lab Results  Component Value Date   WBC 7.6 05/27/2014   HGB 12.1* 05/27/2014   HCT 36.9* 05/27/2014   MCV 97.4 05/27/2014   PLT 207 05/27/2014     Recent Labs Lab 05/27/14 1126 05/28/14 0330  NA 136 134*  K 4.3 3.5  CL 99 98  CO2 28 27  BUN 35* 36*  CREATININE 1.95* 1.83*  CALCIUM 8.8 8.5  PROT 6.7  --   BILITOT 1.2  --   ALKPHOS 63  --   ALT 15  --   AST 21  --   GLUCOSE 163* 241*   Lab Results  Component Value Date   CHOL 92 05/28/2014   HDL 28* 05/28/2014   LDLCALC 37 05/28/2014   TRIG 137 05/28/2014   BNP (last 3 results)  Recent Labs  04/30/14 0830 05/22/14 1029 05/27/14 1126  BNP 780.1* 935.2* 1627.8*    ProBNP (last 3 results)  Recent Labs  10/09/13 0926 11/14/13 0926 01/14/14 1112  PROBNP 1561.0* 1261.0* 5769.0*     Diagnostic Studies/Procedures   Ct Abdomen Pelvis Wo Contrast  05/27/2014   CLINICAL DATA:  Preoperative  evaluation prior to placement of left ventricular assist device  EXAM: CT CHEST, ABDOMEN AND PELVIS WITHOUT CONTRAST  TECHNIQUE: Multidetector CT imaging of the chest, abdomen and pelvis was performed following the standard protocol without IV contrast.  COMPARISON:  No similar prior exam is available at this institution for comparison or on [facility].  FINDINGS: CT CHEST FINDINGS  Mediastinum/Nodes: Left-sided pacer in place. Extensive coronary arterial calcification noted. Mild atheromatous aortic calcification. Great vessels are normal in caliber.  Mild cardiomegaly. Trace pericardial fluid. Pretracheal node measures 1.4 cm image 26. Representative AP window lymph node 1.0 cm image 26. No hilar lymphadenopathy.  Lungs/Pleura: Small bilateral pleural  effusions are identified. Associated adjacent presumed compressive atelectasis is noted. Mild central bronchial wall thickening is identified. A few areas of interlobular septal thickening likely indicate mild interstitial edema. No alveolar consolidation, nodule, or mass.  Upper abdomen: Please see dedicated report below  Musculoskeletal: Mild disc degenerative change noted. No compression deformity. No acute intrathoracic osseous abnormality.  CT ABDOMEN AND PELVIS FINDINGS  Lower chest:  Please see dedicated report above  Hepatobiliary: Liver and gallbladder appear normal.  Pancreas: Normal  Spleen: Normal  Adrenals/Urinary Tract: Mild bilateral nonspecific perinephric fluid. Adrenal glands are normal. No hydroureteronephrosis. 2.1 cm left lower renal pole exophytic lesion measuring 91 Hounsfield units is compatible with a hemorrhagic cyst by Hounsfield unit density criteria.  Stomach/Bowel: No bowel wall thickening or focal segmental dilatation is identified. Appendix appears normal. Stomach is normal in appearance. Colonic diverticuli noted without evidence for diverticulitis.  Vascular/Lymphatic: Moderate atheromatous aortic calcification without aneurysm. No lymphadenopathy.  Reproductive: Prostate appears normal.  Other: Fat containing umbilical hernia.  No free air or fluid.  Musculoskeletal: Lobar spine disc degenerative change noted.  IMPRESSION: Small bilateral pleural effusions and bibasilar minimal interlobular septal thickening likely indicating interstitial edema.  Mild mediastinal lymphadenopathy is most likely reactive in the setting of interstitial pulmonary edema and without any other evidence for systemic malignancy/metastatic disease elsewhere.  No acute intra-abdominal or pelvic pathology or focal abnormality.   Electronically Signed   By: Conchita Paris M.D.   On: 05/27/2014 16:55   Dg Orthopantogram  05/27/2014   CLINICAL DATA:  Preoperative evaluation  EXAM: ORTHOPANTOGRAM/PANORAMIC   COMPARISON:  None  FINDINGS: Scattered dental amalgam.  Suspect fractured LEFT maxillary tooth with missing substance, potentially #1 or #2.  Minimal scattered periodontal lucencies.  Diffuse osseous demineralization.  No other focal mandibular or maxillary abnormalities.  IMPRESSION: Osseous demineralization with minimal scattered periodontal lucency.  Suspected fracture with missing substance at a LEFT maxillary molar, question tooth #1 or #2.   Electronically Signed   By: Lavonia Dana M.D.   On: 05/27/2014 16:56   Ct Chest Wo Contrast  05/27/2014   CLINICAL DATA:  Preoperative evaluation prior to placement of left ventricular assist device  EXAM: CT CHEST, ABDOMEN AND PELVIS WITHOUT CONTRAST  TECHNIQUE: Multidetector CT imaging of the chest, abdomen and pelvis was performed following the standard protocol without IV contrast.  COMPARISON:  No similar prior exam is available at this institution for comparison or on [facility].  FINDINGS: CT CHEST FINDINGS  Mediastinum/Nodes: Left-sided pacer in place. Extensive coronary arterial calcification noted. Mild atheromatous aortic calcification. Great vessels are normal in caliber.  Mild cardiomegaly. Trace pericardial fluid. Pretracheal node measures 1.4 cm image 26. Representative AP window lymph node 1.0 cm image 26. No hilar lymphadenopathy.  Lungs/Pleura: Small bilateral pleural effusions are identified. Associated adjacent presumed compressive atelectasis is noted. Mild central  bronchial wall thickening is identified. A few areas of interlobular septal thickening likely indicate mild interstitial edema. No alveolar consolidation, nodule, or mass.  Upper abdomen: Please see dedicated report below  Musculoskeletal: Mild disc degenerative change noted. No compression deformity. No acute intrathoracic osseous abnormality.  CT ABDOMEN AND PELVIS FINDINGS  Lower chest:  Please see dedicated report above  Hepatobiliary: Liver and gallbladder appear normal.  Pancreas:  Normal  Spleen: Normal  Adrenals/Urinary Tract: Mild bilateral nonspecific perinephric fluid. Adrenal glands are normal. No hydroureteronephrosis. 2.1 cm left lower renal pole exophytic lesion measuring 91 Hounsfield units is compatible with a hemorrhagic cyst by Hounsfield unit density criteria.  Stomach/Bowel: No bowel wall thickening or focal segmental dilatation is identified. Appendix appears normal. Stomach is normal in appearance. Colonic diverticuli noted without evidence for diverticulitis.  Vascular/Lymphatic: Moderate atheromatous aortic calcification without aneurysm. No lymphadenopathy.  Reproductive: Prostate appears normal.  Other: Fat containing umbilical hernia.  No free air or fluid.  Musculoskeletal: Lobar spine disc degenerative change noted.  IMPRESSION: Small bilateral pleural effusions and bibasilar minimal interlobular septal thickening likely indicating interstitial edema.  Mild mediastinal lymphadenopathy is most likely reactive in the setting of interstitial pulmonary edema and without any other evidence for systemic malignancy/metastatic disease elsewhere.  No acute intra-abdominal or pelvic pathology or focal abnormality.   Electronically Signed   By: Conchita Paris M.D.   On: 05/27/2014 16:55   Dg Chest Port 1 View  05/27/2014   CLINICAL DATA:  PICC line placement.  EXAM: PORTABLE CHEST - 1 VIEW  COMPARISON:  Two-view chest 02/01/2014  FINDINGS: The heart size is exaggerated by low lung volumes. Mild pulmonary vascular congestion is present. Pacing wires and AICD are stable in position. An a right-sided PICC line is in place. The tip is just above the cavoatrial junction. There is no pneumothorax. Mild dependent atelectasis is present bilaterally. The lungs are otherwise clear.  IMPRESSION: 1. Borderline cardiomegaly and mild pulmonary vascular congestion. 2. Interval placement of right-sided PICC line. The tip is just above the cavoatrial junction.   Electronically Signed   By:  San Morelle M.D.   On: 05/27/2014 12:25    Discharge Medications     Medication List    STOP taking these medications        spironolactone 25 MG tablet  Commonly known as:  ALDACTONE      TAKE these medications        ALPRAZolam 0.5 MG tablet  Commonly known as:  XANAX  Take 0.5 mg by mouth 3 (three) times daily as needed for anxiety or sleep.     amiodarone 200 MG tablet  Commonly known as:  PACERONE  Take 1 tablet (200 mg total) by mouth 2 (two) times daily.     atorvastatin 40 MG tablet  Commonly known as:  LIPITOR  TAKE 1 TABLET DAILY     digoxin 0.125 MG tablet  Commonly known as:  LANOXIN  Take 0.5 tablets (0.0625 mg total) by mouth daily.     DULoxetine 30 MG capsule  Commonly known as:  CYMBALTA  Take 60 mg by mouth 2 (two) times daily.     GLUCAGON EMERGENCY 1 MG injection  Generic drug:  glucagon  Inject 1 mg as directed once. For hypotension     hydrALAZINE 25 MG tablet  Commonly known as:  APRESOLINE  Take 1 tablet (25 mg total) by mouth 3 (three) times daily.     insulin aspart 100 UNIT/ML injection  Commonly  known as:  novoLOG  Inject 30 Units into the skin 3 (three) times daily before meals.     isosorbide mononitrate 30 MG 24 hr tablet  Commonly known as:  IMDUR  Take 1 tablet (30 mg total) by mouth daily.     ketorolac 0.5 % ophthalmic solution  Commonly known as:  ACULAR  Place 1 drop into the left eye 2 (two) times daily. Start 1 week prior to surgery     LANTUS SOLOSTAR 100 UNIT/ML Solostar Pen  Generic drug:  Insulin Glargine  Inject 32 Units into the skin at bedtime.     levothyroxine 25 MCG tablet  Commonly known as:  SYNTHROID, LEVOTHROID  Take 50 mcg by mouth daily before breakfast.     Melatonin 10 MG Tabs  Take 10 mg by mouth at bedtime.     metoprolol succinate 25 MG 24 hr tablet  Commonly known as:  TOPROL-XL  Take 1 tablet (25 mg total) by mouth 2 (two) times daily before a meal.     milrinone 20 MG/100ML  Soln infusion  Commonly known as:  PRIMACOR  Inject 27.05 mcg/min into the vein continuous.     multivitamin with minerals Tabs tablet  Take 1 tablet by mouth daily.     ofloxacin 0.3 % ophthalmic solution  Commonly known as:  OCUFLOX  Place 1 drop into the right eye 2 (two) times daily. Start 72 hours prior to surgery     oxyCODONE-acetaminophen 10-325 MG per tablet  Commonly known as:  PERCOCET  Take 1 tablet by mouth every 4 (four) hours as needed for pain. For pain     Potassium Chloride ER 20 MEQ Tbcr  Take 20 mEq by mouth daily. 20 meq daily and an extra 20 meq if extra torsemide given.     Rivaroxaban 15 MG Tabs tablet  Commonly known as:  XARELTO  Take 1 tablet (15 mg total) by mouth daily with supper.     senna 8.6 MG tablet  Commonly known as:  SENOKOT  Take 1 tablet by mouth at bedtime.     torsemide 20 MG tablet  Commonly known as:  DEMADEX  Take 2 tablets (40 mg total) by mouth daily.        Disposition   The patient will be discharged in stable condition to home. Discharge Instructions    ACE Inhibitor / ARB already ordered    Complete by:  As directed      Diet - low sodium heart healthy    Complete by:  As directed      Heart Failure patients record your daily weight using the same scale at the same time of day    Complete by:  As directed      Increase activity slowly    Complete by:  As directed           Follow-up Information    Follow up with Loralie Champagne, MD On 06/04/2014.   Specialty:  Cardiology   Why:  at 3:40pm in the Advanced Heart Failure Clinic--gate code 0007--please bring all medications    Contact information:   [address] [place] [address] [phone]         Duration of Discharge Encounter: Greater than 35 minutes   Signed, CLEGG,AMY  NP-C  05/28/2014, 3:03 PM

## 2014-05-28 NOTE — Progress Notes (Addendum)
VASCULAR LAB PRELIMINARY  PRELIMINARY  PRELIMINARY  PRELIMINARY  Pre-op Cardiac Surgery  Carotid Findings:  1-39% carotid stenosis bilaterally.  Vertebral arteries antegrade bilaterally. BLEV:   Negative DVT bilaterally.  Edson Snowball RVT 05/28/2014  Upper Extremity Right Left  Brachial Pressures Restricted arm band 137 triphasic        Lower  Extremity Right Left  Dorsalis Pedis 114 triphasic 147 triphasic  Anterior Tibial    Posterior Tibial 152 triphasic 120 triphasic  Ankle/Brachial Indices >1.0 >1.0    Findings:     BIGGS, SANDRA, RVT 05/28/2014, 2:42 PM

## 2014-05-28 NOTE — Progress Notes (Signed)
INITIAL NUTRITION ASSESSMENT  DOCUMENTATION CODES Per approved criteria  -Obesity Unspecified   INTERVENTION: -Continue with current nutrition plan of care  NUTRITION DIAGNOSIS: Less than optimal intake of types of carbohydrate related to hx of uncontrolled DM as evidenced by Hgb A1c: 10.6.   Goal: Pt will meet >90% of estimated nutritional needs  Monitor:  PO intake, labs, weight changes, I/O's  Reason for Assessment: Consult for LVAD evaluation  64 y.o. male  Admitting Dx: <principal problem not specified>  30 yo with history of chronic systolic CHF (nonischemic cardiomyopathy), CKD, prior CVA, and paroxysmal atrial fibrillation. Patient says that he has been known to have a low EF for years. He has a [employer] officer, political party ICD. He had LHC in 2009 with nonobstructive disease. Last echo in 12/15 showed EF 20-25% with normal RV size and systolic function.  Admitted today to start milrinone and continue LVAD work up. Remains fatigued and dyspneic with exertion. Lives at home with wife and she assists with medications. He has met with LVAD coordinators and would like to pursue LVAD if indicated. . Weight at home 230-238 pounds. Complains of fatigue and dyspnea with exertion.   ASSESSMENT: RD received consult for LVAD evaluation. Attempted to see pt x 3, however in with other members of healthcare team at times of visit (MD, Palliative Care and EKG). Pt is expect to be discharged home later today once home amiodarone is arranged.  Pt with good appetite. Noted 100% meal completion.  Noted wt loss trend over the past 6 months, which is intentional. Pt has been seen by both RD at [place] and [place] in 01/2014 and received education on nutritional management for heart failure, weight loss, and diabetes. Per [facility] note, pt is contemplating transitioning to an insulin pump. Pt with hx of poorly controlled DM (Hgb A1c: 10.6).  Unable to complete  nutrition-focused physical exam at this time, however, do not expect pt has any evidence for subcutaneous fat or muscle depletion. Labs reviewed. Na: 134, BUN/Creat: 36/1.83, Glucose: 241. CBGS: 76-131.   Height: Ht Readings from Last 1 Encounters:  05/27/14 $RemoveB'5\' 10"'WeMkYhEq$  (1.778 m)    Weight: Wt Readings from Last 1 Encounters:  05/28/14 238 lb 1.6 oz (108.001 kg)    Ideal Body Weight: 166#  % Ideal Body Weight: 143%  Wt Readings from Last 20 Encounters:  05/28/14 238 lb 1.6 oz (108.001 kg)  05/22/14 240 lb (108.863 kg)  04/16/14 237 lb (107.502 kg)  03/25/14 239 lb 12.8 oz (108.773 kg)  02/22/14 249 lb 4 oz (113.059 kg)  02/03/14 256 lb 6.3 oz (116.3 kg)  01/17/14 256 lb (116.121 kg)  11/29/13 249 lb 5.4 oz (113.1 kg)  11/19/13 251 lb (113.853 kg)  11/16/13 257 lb (116.574 kg)  09/24/13 244 lb 12.8 oz (111.041 kg)  09/17/13 250 lb (113.399 kg)  08/01/13 254 lb (115.214 kg)  07/11/13 254 lb 11.2 oz (115.531 kg)  06/07/13 256 lb (116.121 kg)  05/22/13 245 lb (111.131 kg)  04/13/13 253 lb 3.2 oz (114.851 kg)  02/15/13 247 lb 6.4 oz (112.22 kg)  03/09/12 238 lb 1.9 oz (108.011 kg)  02/15/11 266 lb (120.657 kg)   Usual Body Weight: 250#  % Usual Body Weight: 95%  BMI:  Body mass index is 34.16 kg/(m^2). Obesity, class I  Estimated Nutritional Needs: Kcal: 2000-2200 Protein: 105-115 grams Fluid: 2.0-2.2 L  Skin: WDL  Diet Order: Diet 2 gram sodium Room service appropriate?: Yes; Fluid  consistency:: Thin Diet Carb Modified Fluid consistency:: Thin; Room service appropriate?: Yes; Fluid restriction:: 2000 mL Fluid  EDUCATION NEEDS: -No education needs identified at this time   Intake/Output Summary (Last 24 hours) at 05/28/14 0956 Last data filed at 05/28/14 0800  Gross per 24 hour  Intake 1050.87 ml  Output   1300 ml  Net -249.13 ml    Last BM: PTA  Labs:   Recent Labs Lab 05/22/14 1028 05/27/14 1126 05/28/14 0330  NA 136 136 134*  K 4.3 4.3 3.5  CL  100 99 98  CO2 $Re'30 28 27  'bBJ$ BUN 43* 35* 36*  CREATININE 2.28* 1.95* 1.83*  CALCIUM 9.1 8.8 8.5  MG  --  2.3  --   GLUCOSE 118* 163* 241*    CBG (last 3)   Recent Labs  05/27/14 1157 05/27/14 1726 05/27/14 2104  GLUCAP 131* 76 93   Lab Results  Component Value Date   HGBA1C 10.1* 05/27/2014   Scheduled Meds: . amiodarone  200 mg Oral BID  . digoxin  0.0625 mg Oral Daily  . DULoxetine  60 mg Oral BID  . hydrALAZINE  25 mg Oral TID  . insulin aspart  30 Units Subcutaneous TID AC  . insulin glargine  32 Units Subcutaneous QHS  . isosorbide mononitrate  30 mg Oral Daily  . ketorolac  1 drop Left Eye BID  . levothyroxine  50 mcg Oral QAC breakfast  . metoprolol succinate  25 mg Oral BID AC  . multivitamin with minerals  1 tablet Oral Daily  . ofloxacin  1 drop Both Eyes BID  . rivaroxaban  15 mg Oral Q supper  . senna  1 tablet Oral QHS  . sodium chloride  10-40 mL Intracatheter Q12H  . sodium chloride  3 mL Intravenous Q12H  . torsemide  40 mg Oral Daily    Continuous Infusions: . milrinone 0.25 mcg/kg/min (05/27/14 2253)    Past Medical History  Diagnosis Date  . Cardiomyopathy     nonischemic (EF 25%)  . CAD (coronary artery disease)     nonobstructive CAD by cath 2009  . Obesity   . CVA (cerebral vascular accident)     CVA 2007 without residual deficit  . 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 Depression   . Erectile dysfunction   . DDD (degenerative disc disease)   . OSA (obstructive sleep apnea)   . Monoclonal gammopathy   . CRI (chronic renal insufficiency)     CRI (baseline creatinine 1.6)  . CKD (chronic kidney disease), stage III     moderate  . 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 Hypogonadotropic hypogonadism   . Polycythemia, secondary     improved/resolved  . Hypercholesterolemia   . Back pain     F/B [name]  . Monoclonal gammopathy  of unknown significance     per [name]  . 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 Neck pain     F/B [name]  . PAF (paroxysmal atrial fibrillation) 06/04/10    Hx of, has device  . History of diverticulitis of colon   . Type II or unspecified type diabetes mellitus with neurological manifestations, uncontrolled 2000  . Nonproliferative diabetic retinopathy NOS(362.03)   . Ventricular tachycardia 10/25/13    appropriate ICD shock, VT CL 230-240 msec  . Confusion  02/01/2014  . Hypertension   . Anginal pain   . Dysrhythmia   . AICD (automatic cardioverter/defibrillator) present   . Presence of permanent cardiac pacemaker   . Anxiety   . Shortness of breath dyspnea   . Peripheral vascular disease     2007    Past Surgical History  Procedure Laterality Date  . Gamma knife surgery for pituitary tumor    . Tonsillectomy    . Cardiac defibrillator placement  02/19/10    By JA.   . Right heart catheterization N/A 09/17/2013    Procedure: RIGHT HEART CATH;  Surgeon: [name], MD;  Location: [facility] CATH LAB;  Service: Cardiovascular;  Laterality: N/A;  . Cardiac catheterization    . Brain surgery    . Insert / replace / remove pacemaker    . Cardioversion N/A 02/14/2014    Procedure: CARDIOVERSION;  Surgeon: [name], MD;  Location: [place];  Service: Cardiovascular;  Laterality: N/A;  . Cardioversion N/A 05/02/2014    Procedure: CARDIOVERSION;  Surgeon: [name], MD;  Location: [facility] ENDOSCOPY;  Service: Cardiovascular;  Laterality: N/A;  . Right heart catheterization N/A 05/08/2014    Procedure: RIGHT HEART CATH;  Surgeon: [name], MD;  Location: [facility] CATH LAB;  Service: Cardiovascular;  Laterality: N/A;    Baer Hinton A. Jimmye Norman, RD, LDN, CDE Pager: [phone] After hours Pager: [phone]

## 2014-05-29 ENCOUNTER — Encounter: Payer: 59 | Admitting: Cardiothoracic Surgery

## 2014-05-29 ENCOUNTER — Other Ambulatory Visit (HOSPITAL_COMMUNITY): Payer: Self-pay | Admitting: *Deleted

## 2014-05-29 LAB — HEPATITIS B SURFACE ANTIBODY, QUANTITATIVE: Hepatitis B-Post: <3.1 m[IU]/mL — ABNORMAL LOW (ref >9.9)

## 2014-05-29 LAB — PSA: PSA: 0.18 ng/mL (ref <=4.00)

## 2014-05-29 LAB — PREALBUMIN: PREALBUMIN: 20 mg/dL — AB (ref 21–43)

## 2014-05-29 MED ORDER — METOPROLOL SUCCINATE ER 25 MG PO TB24: 25.0000 mg | ORAL_TABLET | Freq: Two times a day (BID) | ORAL | Status: DC

## 2014-05-30 ENCOUNTER — Other Ambulatory Visit (HOSPITAL_COMMUNITY): Payer: Self-pay | Admitting: *Deleted

## 2014-05-30 ENCOUNTER — Telehealth: Payer: Self-pay | Admitting: Pulmonary Disease

## 2014-05-30 ENCOUNTER — Encounter: Payer: 59 | Admitting: Cardiothoracic Surgery

## 2014-05-30 DIAGNOSIS — K032 Erosion of teeth: Secondary | ICD-10-CM

## 2014-05-30 DIAGNOSIS — G4733 Obstructive sleep apnea (adult) (pediatric): Secondary | ICD-10-CM | POA: Diagnosis not present

## 2014-05-30 LAB — LUPUS ANTICOAGULANT PANEL
DRVVT: 79.3 s — ABNORMAL HIGH (ref 0.0–55.1)
PTT LA: 59.6 s — AB (ref 0.0–50.0)

## 2014-05-30 LAB — DRVVT CONFIRM: DRVVT CONFIRM: 0.8 ratio (ref 0.0–1.4)

## 2014-05-30 LAB — DRVVT MIX: dRVVT Mix: 66.1 s — ABNORMAL HIGH (ref 0.0–45.4)

## 2014-05-30 LAB — PTT-LA MIX: PTT-LA MIX: 45.5 s (ref 0.0–50.0)

## 2014-05-30 LAB — PTT-LA INCUB MIX: PTT-LA INCUB MIX: 61.4 s — AB (ref 0.0–50.0)

## 2014-05-30 LAB — HEXAGONAL PHASE PHOSPHOLIPID: Hexagonal Phase Phospholipid: 15.6 s — ABNORMAL HIGH (ref 0.0–8.0)

## 2014-05-30 NOTE — Telephone Encounter (Signed)
PSG 05/22/14 >> AHI 62.4, SaO2 low 76%.  Sup-optimal titration >> OSA/CSA with CPAP, not enough time with BiPAP.  Will have my nurse inform pt that he has severe complex sleep apnea.  Options at this time are 1) in lab BiPAP titration study now, or 2) ROV first.  If he is agreeable to titration study, then send order to do titration starting starting with BiPAP 13/9 cm H2O, titrate up as needed, and add supplemental oxygen as needed.

## 2014-05-30 NOTE — Sleep Study (Signed)
[place]  NAME: FAIRLEY COPHER DATE OF BIRTH:  26-Mar-1950 MEDICAL RECORD NUMBER [id number]  LOCATION: [facility]  PHYSICIAN: Chesley Mires, M.D. DATE OF STUDY: 05/22/2014  SLEEP STUDY TYPE: Split night protocol               REFERRING PHYSICIAN: Chesley Mires, MD  INDICATION FOR STUDY:  Larry [name] is a 64 y.o. male who presents to the sleep lab for evaluation of hypersomnia with obstructive sleep apnea.  He reports snoring, sleep disruption, apnea, and daytime sleepiness.  He has hx of CAD, HTN, A fib, and systolic heart failure.  EPWORTH SLEEPINESS SCORE: 19. HEIGHT: 5' 10.5" (179.1 cm)  WEIGHT: 240 lb (108.863 kg)    Body mass index is 33.94 kg/(m^2).  NECK SIZE: 16.75 in.  MEDICATIONS:  Current Outpatient Prescriptions on File Prior to Visit  Medication Sig Dispense Refill  . ALPRAZolam (XANAX) 0.5 MG tablet Take 0.5 mg by mouth 3 (three) times daily as needed for anxiety or sleep.    [name] amiodarone (PACERONE) 200 MG tablet Take 1 tablet (200 mg total) by mouth 2 (two) times daily. 60 tablet 3  . atorvastatin (LIPITOR) 40 MG tablet TAKE 1 TABLET DAILY (Patient taking differently: TAKE 1 TABLET BY MOUTH DAILY) 90 tablet 3  . digoxin (LANOXIN) 0.125 MG tablet Take 0.5 tablets (0.0625 mg total) by mouth daily. 90 tablet 3  . DULoxetine (CYMBALTA) 30 MG capsule Take 60 mg by mouth 2 (two) times daily.     [name] GLUCAGON EMERGENCY 1 MG injection Inject 1 mg as directed once. For hypotension    . insulin aspart (NOVOLOG) 100 UNIT/ML injection Inject 30 Units into the skin 3 (three) times daily before meals.     [name] ketorolac (ACULAR) 0.5 % ophthalmic solution Place 1 drop into the left eye 2 (two) times daily. Start 1 week prior to surgery  4  . LANTUS SOLOSTAR 100 UNIT/ML Solostar Pen Inject 32 Units into the skin at bedtime.     [name] levothyroxine (SYNTHROID, LEVOTHROID) 25 MCG tablet Take 50 mcg by mouth daily before breakfast.   5  . Melatonin  10 MG TABS Take 10 mg by mouth at bedtime.    . Multiple Vitamin (MULITIVITAMIN WITH MINERALS) TABS Take 1 tablet by mouth daily.    [name] ofloxacin (OCUFLOX) 0.3 % ophthalmic solution Place 1 drop into the right eye 2 (two) times daily. Start 72 hours prior to surgery  1  . oxyCODONE-acetaminophen (PERCOCET) 10-325 MG per tablet Take 1 tablet by mouth every 4 (four) hours as needed for pain. For pain    . rivaroxaban (XARELTO) 15 MG TABS tablet Take 1 tablet (15 mg total) by mouth daily with supper. 30 tablet 3  . senna (SENOKOT) 8.6 MG tablet Take 1 tablet by mouth at bedtime.      No current facility-administered medications on file prior to visit.    SLEEP ARCHITECTURE:  Diagnostic portion: Total recording time: 161 minutes.  Total sleep time was: 123 minutes.  Sleep efficiency: 76.4%.  Sleep latency: 18 minutes.  REM latency: N/A minutes.  Stage N1: 14.5%.  Stage N2: 108.5%.  Stage N3: 0%.  Stage R:  0%.  Supine sleep: 0 minutes.  Non-supine sleep: 123 minutes.  Titration portion: Total recording time: 247 minutes.  Total sleep time was: 151.5 minutes.  Sleep efficiency: 61.3%.  Sleep latency: 32.5 minutes.  REM latency: 191.5 minutes.  Stage N1: 16.5%.  Stage N2:  80.9%.  Stage N3: 0%.  Stage R:  2.6%.  Supine sleep: 92.5 minutes.  Non-supine sleep: 59 minutes.  CARDIAC DATA:  Average heart rate: 60 beats per minute. Rhythm strip: sinus rhythm with PVCs.  RESPIRATORY DATA: Average respiratory rate: 19. Snoring: mild.  Diagnostic portion: Average AHI: 62.4.   Apnea index: 27.3.  Hypopnea index: 35.1. Obstructive apnea index: 13.2.  Central apnea index: 12.7.  Mixed apnea index: 1.5. REM AHI: N/A.  NREM AHI: 62.4. Supine AHI: N/A. Non-supine AHI: 62.4.  Titration portion: He was started on CPAP 5 and increased to 12 cm H2O.  He had persistent obstructive events, but also developed central events with higher pressure settings.    He was transitioned to BiPAP starting at 8/4  and increased to 16/7 cm H2O.  He again had persistent obstructive and central events.  MOVEMENT/PARASOMNIA:  Diagnostic portion: Periodic limb movement: 0.  Period limb movements with arousals: 0.  Titration portion: Periodic limb movement: 0.  Period limb movements with arousals: 0.  Restroom trips: 1.  OXYGEN DATA:  Baseline oxygenation: 91%. Lowest SaO2: 76%. Time spent below SaO2 90%: 135.9 minutes. Supplemental oxygen used: none.  IMPRESSION/ RECOMMENDATION:   This study shows severe complex sleep apnea with an AHI of 62.4, and SaO2 low of 76%.  He had both obstructive and central apneic events.  He had a sub-optimal titration portion of the study.  CPAP was proven to be ineffective for him since he had persistence of obstructive events, but also developed worsening central events with increasing CPAP pressures.  He was not adequately titrated with BiPAP due to the time constraints during split night protocol.  I recommend he return to the sleep lab for full night titration study.  He should start on BiPAP 13/9 cm H2O and adjust up as needed.  Could also, then determine if he would benefit from supplemental oxygen at night.     Chesley Mires, M.D. Diplomate, Tax adviser of Sleep Medicine  ELECTRONICALLY SIGNED ON:  05/30/2014, 10:58 AM [place] PH: [phone]   FX: [phone] [place]

## 2014-05-31 LAB — FACTOR 5 LEIDEN

## 2014-06-03 ENCOUNTER — Ambulatory Visit (HOSPITAL_BASED_OUTPATIENT_CLINIC_OR_DEPARTMENT_OTHER): Payer: 59

## 2014-06-03 ENCOUNTER — Ambulatory Visit (HOSPITAL_BASED_OUTPATIENT_CLINIC_OR_DEPARTMENT_OTHER): Payer: 59 | Admitting: Hematology and Oncology

## 2014-06-03 VITALS — BP 116/67 | HR 70 | Temp 97.4°F | Resp 18 | Ht 70.0 in | Wt 236.6 lb

## 2014-06-03 DIAGNOSIS — R760 Raised antibody titer: Secondary | ICD-10-CM

## 2014-06-03 NOTE — Progress Notes (Signed)
[name] CONSULT NOTE  Patient Care Team: Hulan Fess, MD as PCP - General (Family Medicine)  CHIEF COMPLAINTS/PURPOSE OF CONSULTATION:  Antiphospholipid antibody  HISTORY OF PRESENTING ILLNESS:  Larry [name] 65 y.o. male is here because of recent blood work done by nephrology which revealed antiphospholipid antibody. Patient has a history of heart disease for which he was recently admitted with congestive heart failure. He is currently on milrinone drip. He is accompanied today by his wife. They appeared to be very anxious and worried about coming to the cancer center. She had a family member who died of lung cancer. Patient has a prior history of stroke diagnosed in 2007 and was initially treated with aspirin therapy. Later anticoagulation with Coumadin therapy was started and over the past 3 years he was switched to Zarontin for ease of use. He has never had a blood clot in his life. Never had a DVT in his arm and leg or lungs. He appears to understand fully the risks and benefits of anticoagulation and is willing to continue with it for life.  On recent blood work by his nephrologist, he underwent testing for antiphospholipid antibodies which came back positive. He has been referred to [place] to discuss the significance of this antibodies.  I reviewed her records extensively and collaborated the history with the patient.  MEDICAL HISTORY:  Past Medical History  Diagnosis Date  . Cardiomyopathy     nonischemic (EF 25%)  . CAD (coronary artery disease)     nonobstructive CAD by cath 2009  . Obesity   . CVA (cerebral vascular accident)     CVA 2007 without residual deficit  . Pituitary tumor      (nonfunctionging pituitary microadenoma) s/p gamma knife surgery at [facility] 2009 with neuropathy and retinopathy  . Depression   . Erectile dysfunction   . DDD (degenerative disc disease)   . OSA (obstructive sleep apnea)   . Monoclonal gammopathy   . CRI (chronic  renal insufficiency)     CRI (baseline creatinine 1.6)  . CKD (chronic kidney disease), stage III     moderate  . 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 Hypogonadotropic hypogonadism   . Polycythemia, secondary     improved/resolved  . Hypercholesterolemia   . Back pain     F/B [name]  . Monoclonal gammopathy of unknown significance     per [name]  . 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 Neck pain     F/B [name]  . PAF (paroxysmal atrial fibrillation) 06/04/10    Hx of, has device  . History of diverticulitis of colon   . Type II or unspecified type diabetes mellitus with neurological manifestations, uncontrolled 2000  . Nonproliferative diabetic retinopathy NOS(362.03)   . Ventricular tachycardia 10/25/13    appropriate ICD shock, VT CL 230-240 msec  . Confusion 02/01/2014  . Hypertension   . Anginal pain   . Dysrhythmia   . AICD (automatic cardioverter/defibrillator) present   . Presence of permanent cardiac pacemaker   . Anxiety   . Shortness of breath dyspnea   . Peripheral vascular disease     2007    SURGICAL HISTORY: Past Surgical History  Procedure Laterality Date  . Gamma knife surgery for pituitary tumor    . Tonsillectomy    . Cardiac defibrillator placement  02/19/10    By JA.   . Right heart catheterization N/A 09/17/2013  Procedure: RIGHT HEART CATH;  Surgeon: [name], MD;  Location: [facility] CATH LAB;  Service: Cardiovascular;  Laterality: N/A;  . Cardiac catheterization    . Brain surgery    . Insert / replace / remove pacemaker    . Cardioversion N/A 02/14/2014    Procedure: CARDIOVERSION;  Surgeon: [name], MD;  Location: [place];  Service: Cardiovascular;  Laterality: N/A;  . Cardioversion N/A 05/02/2014    Procedure: CARDIOVERSION;  Surgeon: [name], MD;  Location: [facility] ENDOSCOPY;  Service: Cardiovascular;  Laterality: N/A;  . Right heart catheterization N/A 05/08/2014     Procedure: RIGHT HEART CATH;  Surgeon: [name], MD;  Location: [facility] CATH LAB;  Service: Cardiovascular;  Laterality: N/A;    SOCIAL HISTORY: History   Social History  . Marital Status: Married    Spouse Name: N/A  . Number of Children: N/A  . Years of Education: N/A   Occupational History  . Disabled    Social History Main Topics  . Smoking status: Former Smoker    Types: Cigars    Quit date: 02/09/1983  . Smokeless tobacco: Not on file     Comment: 25 yrs ago  . Alcohol Use: No  . Drug Use: No  . Sexual Activity: No   Other Topics Concern  . Not on file   Social History Narrative    FAMILY HISTORY: Family History  Problem Relation Age of Onset  . Lung cancer    . Cancer Mother     skin  . Dementia Mother   . Depression Mother   . Heart disease Father   . Hypertension Father   . Lung cancer Father   . Obesity Father   . Obesity Sister   . Hypertension Sister   . Diabetes Brother   . Hypertension Brother   . Heart disease Brother   . Obesity Brother   . Lung cancer Paternal Uncle   . Diabetes Paternal Uncle     requiring leg amputations     ALLERGIES:  is allergic to bydureon and losartan potassium.  MEDICATIONS:  Current Outpatient Prescriptions  Medication Sig Dispense Refill  . ALPRAZolam (XANAX) 0.5 MG tablet Take 0.5 mg by mouth 3 (three) times daily as needed for anxiety or sleep.    [name] amiodarone (PACERONE) 200 MG tablet Take 1 tablet (200 mg total) by mouth 2 (two) times daily. 60 tablet 3  . atorvastatin (LIPITOR) 40 MG tablet TAKE 1 TABLET DAILY (Patient taking differently: TAKE 1 TABLET BY MOUTH DAILY) 90 tablet 3  . digoxin (LANOXIN) 0.125 MG tablet Take 0.5 tablets (0.0625 mg total) by mouth daily. 90 tablet 3  . DULoxetine (CYMBALTA) 30 MG capsule Take 60 mg by mouth 2 (two) times daily.     [name] GLUCAGON EMERGENCY 1 MG injection Inject 1 mg as directed once. For hypotension    . hydrALAZINE (APRESOLINE) 25 MG tablet Take 1 tablet (25 mg  total) by mouth 3 (three) times daily. 90 tablet 3  . insulin aspart (NOVOLOG) 100 UNIT/ML injection Inject 30 Units into the skin 3 (three) times daily before meals.     . isosorbide mononitrate (IMDUR) 30 MG 24 hr tablet Take 1 tablet (30 mg total) by mouth daily. 30 tablet 3  . ketorolac (ACULAR) 0.5 % ophthalmic solution Place 1 drop into the left eye 2 (two) times daily. Start 1 week prior to surgery  4  . LANTUS SOLOSTAR 100 UNIT/ML Solostar Pen Inject 32 Units  into the skin at bedtime.     [name] levothyroxine (SYNTHROID, LEVOTHROID) 25 MCG tablet Take 50 mcg by mouth daily before breakfast.   5  . Melatonin 10 MG TABS Take 10 mg by mouth at bedtime.    . metoprolol succinate (TOPROL-XL) 25 MG 24 hr tablet Take 1 tablet (25 mg total) by mouth 2 (two) times daily before a meal. 60 tablet 6  . milrinone (PRIMACOR) 20 MG/100ML SOLN infusion Inject 27.05 mcg/min into the vein continuous. 100 mL 6  . Multiple Vitamin (MULITIVITAMIN WITH MINERALS) TABS Take 1 tablet by mouth daily.    [name] ofloxacin (OCUFLOX) 0.3 % ophthalmic solution Place 1 drop into the right eye 2 (two) times daily. Start 72 hours prior to surgery  1  . oxyCODONE-acetaminophen (PERCOCET) 10-325 MG per tablet Take 1 tablet by mouth every 4 (four) hours as needed for pain. For pain    . potassium chloride 20 MEQ TBCR Take 20 mEq by mouth daily. 20 meq daily and an extra 20 meq if extra torsemide given. 45 tablet 6  . rivaroxaban (XARELTO) 15 MG TABS tablet Take 1 tablet (15 mg total) by mouth daily with supper. 30 tablet 3  . senna (SENOKOT) 8.6 MG tablet Take 1 tablet by mouth at bedtime.     . torsemide (DEMADEX) 20 MG tablet Take 2 tablets (40 mg total) by mouth daily. 270 tablet 3   No current facility-administered medications for this visit.    REVIEW OF SYSTEMS:   Constitutional: Denies fevers, chills or abnormal night sweats Eyes: Denies blurriness of vision, double vision or watery eyes Ears, nose, mouth, throat, and face:  Denies mucositis or sore throat Respiratory: Denies cough, dyspnea or wheezes Cardiovascular: shortness of breath, cough Gastrointestinal:  Denies nausea, heartburn or change in bowel habits Skin: Denies abnormal skin rashes Lymphatics: Denies new lymphadenopathy or easy bruising Neurological:Denies numbness, tingling or new weaknesses Behavioral/Psych: Mood is stable, no new changes  All other systems were reviewed with the patient and are negative.  PHYSICAL EXAMINATION: ECOG PERFORMANCE STATUS: 1 - Symptomatic but completely ambulatory  Filed Vitals:   06/03/14 1255  BP: 116/67  Pulse: 70  Temp: 97.4 F (36.3 C)  Resp: 18   Filed Weights   06/03/14 1255  Weight: 236 lb 9 oz (107.304 kg)    GENERAL:alert, no distress and comfortable SKIN: skin color, texture, turgor are normal, no rashes or significant lesions EYES: normal, conjunctiva are pink and non-injected, sclera clear OROPHARYNX:no exudate, no erythema and lips, buccal mucosa, and tongue normal  NECK: supple, thyroid normal size, non-tender, without nodularity LYMPH:  no palpable lymphadenopathy in the cervical, axillary or inguinal LUNGS: clear to auscultation and percussion with normal breathing effort HEART: pansystolic murmur ABDOMEN:abdomen soft, non-tender and normal bowel sounds Musculoskeletal:no cyanosis of digits and no clubbing  PSYCH: alert & oriented x 3 with fluent speech NEURO: no focal motor/sensory deficits   LABORATORY DATA:  I have reviewed the data as listed Lab Results  Component Value Date   WBC 7.6 05/27/2014   HGB 12.1* 05/27/2014   HCT 36.9* 05/27/2014   MCV 97.4 05/27/2014   PLT 207 05/27/2014   Lab Results  Component Value Date   NA 134* 05/28/2014   K 3.5 05/28/2014   CL 98 05/28/2014   CO2 27 05/28/2014    ASSESSMENT AND PLAN:  Antiphospholipid antibody positive Antiphospholipid antibody: I discussed with the patient extensively about the significance of  antiphospholipid antibodies. I also discussed with them  how they increase the risk of clots. Presence of antibody puts the patient at a slightly higher risk of blood clots. He has never had any prior DVT. He did have a stroke in 2007. It is unclear the etiology of stroke as it could be related to underlying cardiomyopathy/irregular heart rate. So I would not call the patient as having antiphospholipid antibody syndrome. I explained to the patient the difference between the syndrome and the antibody  Plan: Since the patient will remain on anticoagulation for life there is no need to recheck his antibody levels in the future. I instructed that if he needs to come off anticoagulation he may want to consider using low molecular weight heparin. The patient also understands that not everyone with the antiphospholipid antibody will dull up blood clots.  The remainder of hypercoagulability panel including antithrombin III as well as factor V Leiden were normal. Patient has multiple comorbidities including hypertension, cardiomyopathy, congestive heart failure on milrinone drip. Patient might undergo additional surgical procedures in the future for his heart.  I would see the patient on as-needed basis. Thank you very much for your consultation.   All questions were answered. The patient knows to call the clinic with any problems, questions or concerns.    Rulon Eisenmenger, MD 1:31 PM

## 2014-06-03 NOTE — Assessment & Plan Note (Signed)
Antiphospholipid antibody: I discussed with the patient extensively about the significance of antiphospholipid antibodies. I also discussed with them how they increase the risk of clots. Presence of antibody puts the patient at a slightly higher risk of blood clots. He has never had any prior DVT. He did have a stroke in 2007. It is unclear the etiology of stroke as it could be related to underlying cardiomyopathy/irregular heart rate. So I would not call the patient as having antiphospholipid antibody syndrome. I explained to the patient the difference between the syndrome and the antibody  Plan: Since the patient will remain on anticoagulation for life there is no need to recheck his antibody levels in the future. I instructed that if he needs to come off anticoagulation he may want to consider using low molecular weight heparin. The patient also understands that not everyone with the antiphospholipid antibody will dull up blood clots.  The remainder of hypercoagulability panel including antithrombin III as well as factor V Leiden were normal. Patient has multiple comorbidities including hypertension, cardiomyopathy, congestive heart failure on milrinone drip. Patient might undergo additional surgical procedures in the future for his heart.  I would see the patient on as-needed basis. Thank you very much for your consultation.

## 2014-06-04 ENCOUNTER — Ambulatory Visit: Payer: 59

## 2014-06-04 ENCOUNTER — Ambulatory Visit (HOSPITAL_COMMUNITY)
Admission: RE | Admit: 2014-06-04 | Discharge: 2014-06-04 | Disposition: A | Discharge status: Home / Self Care | Payer: 59 | Source: Ambulatory Visit | Attending: Cardiology | Admitting: Cardiology

## 2014-06-04 ENCOUNTER — Encounter (HOSPITAL_COMMUNITY): Payer: Self-pay

## 2014-06-04 ENCOUNTER — Ambulatory Visit: Payer: 59 | Admitting: Internal Medicine

## 2014-06-04 ENCOUNTER — Other Ambulatory Visit: Payer: 59

## 2014-06-04 VITALS — BP 122/42 | HR 72 | Wt 238.4 lb

## 2014-06-04 DIAGNOSIS — Z794 Long term (current) use of insulin: Secondary | ICD-10-CM | POA: Diagnosis not present

## 2014-06-04 DIAGNOSIS — E119 Type 2 diabetes mellitus without complications: Secondary | ICD-10-CM | POA: Diagnosis not present

## 2014-06-04 DIAGNOSIS — Z95810 Presence of automatic (implantable) cardiac defibrillator: Secondary | ICD-10-CM | POA: Insufficient documentation

## 2014-06-04 DIAGNOSIS — I10 Essential (primary) hypertension: Secondary | ICD-10-CM

## 2014-06-04 DIAGNOSIS — Z79899 Other long term (current) drug therapy: Secondary | ICD-10-CM | POA: Diagnosis not present

## 2014-06-04 DIAGNOSIS — I2510 Atherosclerotic heart disease of native coronary artery without angina pectoris: Secondary | ICD-10-CM | POA: Diagnosis not present

## 2014-06-04 DIAGNOSIS — Z87891 Personal history of nicotine dependence: Secondary | ICD-10-CM | POA: Diagnosis not present

## 2014-06-04 DIAGNOSIS — F419 Anxiety disorder, unspecified: Secondary | ICD-10-CM | POA: Insufficient documentation

## 2014-06-04 DIAGNOSIS — Z7902 Long term (current) use of antithrombotics/antiplatelets: Secondary | ICD-10-CM | POA: Diagnosis not present

## 2014-06-04 DIAGNOSIS — F329 Major depressive disorder, single episode, unspecified: Secondary | ICD-10-CM | POA: Insufficient documentation

## 2014-06-04 DIAGNOSIS — N189 Chronic kidney disease, unspecified: Secondary | ICD-10-CM | POA: Diagnosis not present

## 2014-06-04 DIAGNOSIS — E039 Hypothyroidism, unspecified: Secondary | ICD-10-CM | POA: Diagnosis not present

## 2014-06-04 DIAGNOSIS — E669 Obesity, unspecified: Secondary | ICD-10-CM | POA: Diagnosis not present

## 2014-06-04 DIAGNOSIS — N183 Chronic kidney disease, stage 3 unspecified: Secondary | ICD-10-CM

## 2014-06-04 DIAGNOSIS — I5022 Chronic systolic (congestive) heart failure: Secondary | ICD-10-CM | POA: Diagnosis not present

## 2014-06-04 DIAGNOSIS — G4733 Obstructive sleep apnea (adult) (pediatric): Secondary | ICD-10-CM | POA: Diagnosis not present

## 2014-06-04 DIAGNOSIS — I440 Atrioventricular block, first degree: Secondary | ICD-10-CM | POA: Diagnosis not present

## 2014-06-04 DIAGNOSIS — Z8673 Personal history of transient ischemic attack (TIA), and cerebral infarction without residual deficits: Secondary | ICD-10-CM | POA: Insufficient documentation

## 2014-06-04 DIAGNOSIS — I5023 Acute on chronic systolic (congestive) heart failure: Secondary | ICD-10-CM

## 2014-06-04 DIAGNOSIS — E785 Hyperlipidemia, unspecified: Secondary | ICD-10-CM | POA: Insufficient documentation

## 2014-06-04 DIAGNOSIS — I4891 Unspecified atrial fibrillation: Secondary | ICD-10-CM | POA: Diagnosis not present

## 2014-06-04 DIAGNOSIS — R531 Weakness: Secondary | ICD-10-CM | POA: Diagnosis not present

## 2014-06-04 DIAGNOSIS — I429 Cardiomyopathy, unspecified: Secondary | ICD-10-CM | POA: Diagnosis not present

## 2014-06-04 MED ORDER — ZOLPIDEM TARTRATE 5 MG PO TABS: 5.0000 mg | ORAL_TABLET | Freq: Every evening | ORAL | Status: DC | PRN

## 2014-06-04 NOTE — Patient Instructions (Signed)
We have scheduled you for a right and left heart cath Wednesday, May 18th at 8:30 am. See instruction sheet for further details.  Will order home health physical therapy with Larry [name].  Follow up 2 weeks.  Do the following things EVERYDAY: 1) Weigh yourself in the morning before breakfast. Write it down and keep it in a log. 2) Take your medicines as prescribed 3) Eat low salt foods-Limit salt (sodium) to 2000 mg per day.  4) Stay as active as you can everyday 5) Limit all fluids for the day to less than 2 liters

## 2014-06-04 NOTE — Progress Notes (Signed)
Patient ID: Larry [name], male   DOB: January 22, 1951, 64 y.o.   MRN: [id number] PCP: Dr. Rex Kras Endocrinologist: Dr Buddy Duty  64 yo with history of chronic systolic CHF (nonischemic cardiomyopathy), CKD, prior CVA, and paroxysmal atrial fibrillation.  Patient says that he has been known to have a low EF for years.  He has a [employer], [employer] ICD.  He had LHC in 2009 with nonobstructive disease. Last echo in 12/15 showed EF 20-25% with normal RV size and systolic function.     Patient had [place] 8/15 showing low cardiac output, mildly elevated PCWP and primarily pulmonary venous hypertension.  He was transitioned from Lasix to torsemide and started on digoxin.  Entresto was started and valsartan stopped.   Adenosine Cardiolite was done in 12/15 showing large area of apical scar but no ischemia.  This raised concern for possible ischemic cardiomyopathy.  He was admitted later in 12/15 with acute on chronic systolic CHF.  He also was noted to have gone into atrial fibrillation during this hospitalization, amiodarone was started in preparation for possible DCCV.  Troponin was mildly elevated in the hospital in the absence of chest pain, likely demand ischemia.  He was diuresed with IV Lasix and was discharged soon after.  He was cardioverted back to NSR on 02/14/14.  He was cardioverted again to NSR in 3/16.  He had RHC done 05/08/14 with mixed pulmonary hypertension and PWCP 25, CI 1.75.  CPX done 4/16 showed severe functional limitation.  In 4/16, he was started on home milrinone via PICC at 0.25 mcg/kg/min.  Co-ox increased from 55% => 72%.  Entresto stopped with elevated creatinine.   He returns for followup today.  He remains in NSR.  He feels like the milrinone helps.  He is more alert.  No dyspnea walking on flat ground, some dyspnea going up steps.  He is able to walk around stores.  No orthopnea/PND.  No chest pain.  He has chronic low back pain controlled with Percocet. Weight is down 2 lbs.    ECG: NSR, 1st degree AV  block, IVCD  Labs (2/15): LDL 103 Labs (3/15): K 4.1, creatinine 1.5, BNP 164 Labs (4/15): K 3.5, creatinine 3.3 => 1.9 => 2.1, BNP 96 Labs (4/15): K 4.3, creatinine 1.43, glucose 308 Labs (5/15): K 5.1 Creatinine 1.35  Labs (8/15): K 5 => 4.7 => 3.8, creatinine 1.19 => 1.57 => 1.6, digoxin 1.1 Labs (9/15): K 4.6, creatinine 1.32 => 1.9 => 1.1, digoxin 0.6 => 1.2 (not trough) Labs (10/15): K 4.5, creatinine 1.36, pro-BNP 1261 Labs (12/15): TSH 8.6 (elevated), free T3/free T4 normal, K 4.5, creatinine 1.71, digoxin 0.6, HCT 44.1, AST 38, ALT 44, TSH normal Labs (1/16): K 5.1, creatinine 1.96, LFTs normal, BNP 952 Labs (3/16): K 4.6, creatinine 2.06, LFTs normal, digoxin 1.8 (decreased digoxin to 0.0625) Labs (4/16): K 3.5, creatinine 1.83, LDL 37, HDL 28, HIV negative, lupus anticoagulant +.   PMH: 1. Chronic systolic CHF: ? Nonischemic cardiomyopathy.  Most recent echo in 3/15 with EF 25-30%, moderate LV dilation, mild LVH, moderate diastolic dysfunction, IVC normal, mildly decreased RV systolic function.  Prior echo in 2011 also with EF 25-30%.  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  [employer] ICD. CPX 05/2013: FVC 3.89 (82%), FEV1 2.99 (83%), FEV1/FVC 77%, Peak HR 118 (74% predicted max), Peak VO2 12.4 (51% of predicted; when corrected to IBW =17.7), VE/VCO2 29.9, OUES 1.39, Peak RER 1.21.  RHC (8/15) with mean RA 6, PA 60/21 mean 36, mean PCWP  21, CI 1.81, PVR 3.6 WU.  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  Echo (12/15) with EF 20-25%, inferior akinesis, normal RV size and systolic function.  RHC (3/16) with mean RA 7, PA 76/24 mean 43, mean PCWP 25, CI 1.75, PVR 4.62. CPX (4/16) with RER 0.99, peak VO2 7.1, slope 36 => severely decreased functional capacity though submaximal. Home milrinone at 0.25 mcg/kg/min begun 4/16.  2. Type II diabetes 3. CKD 4. Monoclonal gammopathy of uncertain  etiology.  5. CVA 2007 6. Degenerative disc disease: Chronic low back pain.   7. Pituitary microadenoma: Nonfunctioning, s/p surgery in 2009.  8. OSA: Unable to tolerate Bipap/CPAP. 9. Depression 10. Hypothyroidism 11. Hyperlipidemia 12. Atrial fibrillation:   Patient is on Xarelto. DCCV 02/14/14 to NSR.  13. 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  Saw Dr Lindi Adie with hematology.   SH: Married, former cigar smoker, unemployed.   FH: Father with MI x 2 and CHF, strong history of CAD on his father's side.   ROS: All systems reviewed and negative except as per HPI.   Current Outpatient Prescriptions  Medication Sig Dispense Refill  . ALPRAZolam (XANAX) 0.5 MG tablet Take 0.5 mg by mouth 3 (three) times daily as needed for anxiety or sleep.    [name] amiodarone (PACERONE) 200 MG tablet Take 1 tablet (200 mg total) by mouth 2 (two) times daily. 60 tablet 3  . atorvastatin (LIPITOR) 40 MG tablet TAKE 1 TABLET DAILY (Patient taking differently: TAKE 1 TABLET BY MOUTH DAILY) 90 tablet 3  . digoxin (LANOXIN) 0.125 MG tablet Take 0.5 tablets (0.0625 mg total) by mouth daily. 90 tablet 3  . DULoxetine (CYMBALTA) 30 MG capsule Take 60 mg by mouth 2 (two) times daily.     [name] GLUCAGON EMERGENCY 1 MG injection Inject 1 mg as directed once. For hypotension    . hydrALAZINE (APRESOLINE) 25 MG tablet Take 1 tablet (25 mg total) by mouth 3 (three) times daily. 90 tablet 3  . insulin aspart (NOVOLOG) 100 UNIT/ML injection Inject 30 Units into the skin 3 (three) times daily before meals.     . isosorbide mononitrate (IMDUR) 30 MG 24 hr tablet Take 1 tablet (30 mg total) by mouth daily. 30 tablet 3  . ketorolac (ACULAR) 0.5 % ophthalmic solution Place 1 drop into the left eye 2 (two) times daily. Start 1 week prior to surgery  4  . LANTUS SOLOSTAR 100 UNIT/ML Solostar Pen Inject 32 Units into the skin at bedtime.     [name] levothyroxine (SYNTHROID, LEVOTHROID) 25 MCG tablet Take 50 mcg  by mouth daily before breakfast.   5  . Melatonin 10 MG TABS Take 10 mg by mouth at bedtime.    . metoprolol succinate (TOPROL-XL) 25 MG 24 hr tablet Take 1 tablet (25 mg total) by mouth 2 (two) times daily before a meal. 60 tablet 6  . milrinone (PRIMACOR) 20 MG/100ML SOLN infusion Inject 27.05 mcg/min into the vein continuous. 100 mL 6  . Multiple Vitamin (MULITIVITAMIN WITH MINERALS) TABS Take 1 tablet by mouth daily.    [name] ofloxacin (OCUFLOX) 0.3 % ophthalmic solution Place 1 drop into the right eye 2 (two) times daily. Start 72 hours prior to surgery  1  . oxyCODONE-acetaminophen (PERCOCET) 10-325 MG per tablet Take 1 tablet by mouth every 4 (four) hours as needed for pain. For pain    . potassium  chloride 20 MEQ TBCR Take 20 mEq by mouth daily. 20 meq daily and an extra 20 meq if extra torsemide given. 45 tablet 6  . rivaroxaban (XARELTO) 15 MG TABS tablet Take 1 tablet (15 mg total) by mouth daily with supper. 30 tablet 3  . senna (SENOKOT) 8.6 MG tablet Take 1 tablet by mouth at bedtime.     . torsemide (DEMADEX) 20 MG tablet Take 2 tablets (40 mg total) by mouth daily. 270 tablet 3  . zolpidem (AMBIEN) 5 MG tablet Take 1 tablet (5 mg total) by mouth at bedtime as needed for sleep. 30 tablet 0   No current facility-administered medications for this encounter.    Filed Vitals:   06/04/14 1248  BP: 122/42  Pulse: 72  Weight: 238 lb 6.4 oz (108.138 kg)  SpO2: 94%   General: NAD Wife present  Neck: Thick, JVP 7 cm. No thyromegaly or thyroid nodule.  Lungs: Clear to auscultation bilaterally with normal respiratory effort. CV: Lateral PMI.  Heart regular S1/S2, no S3/S4, no murmur.  No edema.  No carotid bruit.  Normal pedal pulses.  Abdomen: Obese, Soft, tender to palpation, obese, no hepatosplenomegaly, non-distended Skin: Intact without lesions or rashes.  Neurologic: Alert and oriented x 3.  Psych: Flat affect. Extremities: No clubbing or cyanosis.   Assessment/plan: 1. Chronic  systolic CHF: Cardiomyopathy x years.  Thought to be nonischemic but Cardiolite in 12/15 showed apical scar (no ischemia).  EF 20-25% with normal RV size and systolic function on 79/02 echo. Has [employer] ICD, narrow QRS at baseline, more recently nonspecific IVCD so has not been a good CRT candidate.  RHC in 3/16 showed low cardiac output with mixed pulmonary venous and pulmonary arterial hypertension ([place] may be due to vascular remodeling over time in the face of persistently elevated left atrial pressure).  PCWP was elevated but not markedly so.  RA pressure was normal, suggesting preserved RV function.  CPX in 4/16 showed severe functional impairment but was submaximal.  He has started on milrinone gtt in 4/16 with improvement in symptoms and creatinine, now NYHA class II-III symptoms. - We discussed LVAD workup further today.  He is not a transplant candidate at this point (weight, pulmonary hypertension). I will continue the milrinone for a month then bring him back in for repeat RHC to see if PA pressure improves.  Revatio would be an option if PA pressure remains high.  His RV function appears preserved by echo and by last RHC assessment. If creatinine also improves, I will do coronary angiography as well to rule out significant new coronary disease prior to LVAD (last cath in 2009).  Barriers to LVAD are pulmonary HTN (as above), elevated creatinine (seems to be improving with milrinone), and chronic back pain.  The back pain appears controllable with Percocent currently.  Finally, I would like to see him walking more (suggested 15 minutes twice a day).  - Continue current torsemide, 40 mg daily.  - Continue current digoxin, Toprol XL, and hydralazine/Imdur (off Entresto with elevated creatinine).     - Check BMET today.   2. CKD: BMET today. Now off Entresto.  Renal function seems to be starting to improve with milrinone.  3. Atrial fibrillation: He is now out of atrial fibrillation.  He is on Xarelto  and amiodarone 200 mg bid.  Recent LFTs ok.  TSH high but followed by endocrinology for known hypothyroidism.  There is a good chance that he will go back into atrial  fibrillation on milrinone, but I do not think that maintenance of NSR over the long-term is going to do enough to avoid advanced therapies.  4. Obesity:  Needs to lose weight.  Asked him to cut back on his portions and try low carb diet.   5. DM2: Continue to follow with endocrinology.   6. Depression/anxiety:  Doing better now that he is seeing a psychologist.   7. Fatigue: In addition to CHF, I think that OSA (that is untreated as he has had a hard time with CPAP) and depression play a large role. He is still working on getting a CPAP mask that works.   8. CAD: Though to have nonischemic CMP, but Cardiolite showed large apical scar.  No chest pain.  Continue statin.  Not on ASA given Xarelto use.  Plan coronary angiography prior to LVAD.  Will give 1 month of milrinone to hopefully allow improved renal function, then will plan to hold Xarelto for 2 days followed by cath.  We discussed risks/benefits of this including risk of renal function worsening.   Loralie Champagne  06/04/2014

## 2014-06-05 ENCOUNTER — Ambulatory Visit (HOSPITAL_COMMUNITY): Payer: Self-pay | Admitting: Dentistry

## 2014-06-05 ENCOUNTER — Encounter (HOSPITAL_COMMUNITY): Payer: Self-pay | Admitting: Dentistry

## 2014-06-05 VITALS — BP 135/62 | HR 75 | Temp 97.7°F

## 2014-06-05 DIAGNOSIS — M270 Developmental disorders of jaws: Secondary | ICD-10-CM | POA: Insufficient documentation

## 2014-06-05 DIAGNOSIS — K045 Chronic apical periodontitis: Secondary | ICD-10-CM

## 2014-06-05 DIAGNOSIS — K029 Dental caries, unspecified: Secondary | ICD-10-CM | POA: Insufficient documentation

## 2014-06-05 DIAGNOSIS — K036 Deposits [accretions] on teeth: Secondary | ICD-10-CM

## 2014-06-05 DIAGNOSIS — K08409 Partial loss of teeth, unspecified cause, unspecified class: Secondary | ICD-10-CM

## 2014-06-05 DIAGNOSIS — Z01818 Encounter for other preprocedural examination: Secondary | ICD-10-CM

## 2014-06-05 DIAGNOSIS — K0530 Chronic periodontitis, unspecified: Secondary | ICD-10-CM

## 2014-06-05 DIAGNOSIS — Z7901 Long term (current) use of anticoagulants: Secondary | ICD-10-CM

## 2014-06-05 DIAGNOSIS — IMO0002 Reserved for concepts with insufficient information to code with codable children: Secondary | ICD-10-CM

## 2014-06-05 DIAGNOSIS — K083 Retained dental root: Secondary | ICD-10-CM

## 2014-06-05 DIAGNOSIS — I428 Other cardiomyopathies: Secondary | ICD-10-CM

## 2014-06-05 DIAGNOSIS — I429 Cardiomyopathy, unspecified: Secondary | ICD-10-CM

## 2014-06-05 DIAGNOSIS — M264 Malocclusion, unspecified: Secondary | ICD-10-CM

## 2014-06-05 DIAGNOSIS — K0889 Other specified disorders of teeth and supporting structures: Secondary | ICD-10-CM

## 2014-06-05 DIAGNOSIS — Z9189 Other specified personal risk factors, not elsewhere classified: Secondary | ICD-10-CM

## 2014-06-05 NOTE — Progress Notes (Signed)
DENTAL CONSULTATION  Date of Consultation:  06/05/2014 Patient Name:   Larry [name] Date of Birth:   Mar 12, 1950 Medical Record Number: [id number]  VITALS: BP 135/62 mmHg  Pulse 75  Temp(Src) 97.7 F (36.5 C) (Oral)  CHIEF COMPLAINT: Patient referred by Dr. Cyndia Bent for a dental consultation.  HPI: Larry [name] is a 64 year old male with cardiomyopathy and chronic systolic heart failure. Patient with anticipated LVAD placement. Patient is now seen as part of a medically necessary pre-LVAD placement dental protocol examination rule out dental infection that may affect the patient's systemic health and anticipated LVAD placement.  The patient currently denies acute toothaches, swellings, or abscesses. The patient was last seen by a dentist proximally 5 years ago to have a tooth pulled. Patient denies any complications from that dental extraction. Prior to that the patient had not been seen for an additional 5 years. Patient indicates that he saw Dr. Apolinar Junes at that time. Patient does have a history of periodontal therapy with scaling and root planing with a periodontist. Patient denies having any partial dentures. Patient did have a conversation with a prosthodontist about implant therapy and complete denture fabrication but could not afford the $20,000 required for that dental care by his report. He patient is not interested in implant therapy at this time. The patient is not interested in referral to a prosthodontist at this time.  PROBLEM LIST: Patient Active Problem List   Diagnosis Date Noted  . Retained dental root 06/05/2014  . Chronic apical periodontitis 06/05/2014  . Dental caries 06/05/2014  . Bilateral mandibular lingual tori 06/05/2014  . Chronic periodontitis 06/05/2014  . Antiphospholipid antibody positive 06/03/2014  . Acute on chronic systolic CHF (congestive heart failure), NYHA class 3 05/27/2014  . Elevated troponin   . Acute on chronic systolic congestive  heart failure, NYHA class 3 02/01/2014  . Chronic anticoagulation, on Xarelto, CHADSVASC 5 score 02/01/2014  . Confusion 02/01/2014  . Acute on chronic systolic HF (heart failure) 02/01/2014  . Chest pain of uncertain etiology 97/35/3299  . Ventricular tachycardia 11/19/2013  . OSA (obstructive sleep apnea), intolerant to CPAP/BIPAP 11/18/2013  . Depression with anxiety 08/01/2013  . DM2 (diabetes mellitus, type 2)   . Syncope and collapse  05/19/2013  . CKD (chronic kidney disease) stage 3, GFR 30-59 ml/min 05/15/2013  . Automatic implantable cardioverter-defibrillator in situ 11/29/2011  . Essential hypertension, benign 06/04/2010  . Persistent atrial fibrillation 06/04/2010  . Obesity 01/21/2010  . CARDIOMYOPATHY 01/21/2010  . CHRONIC SYSTOLIC HEART FAILURE 24/26/8341  . SHORTNESS OF BREATH 01/21/2010    PMH: Past Medical History  Diagnosis Date  . Cardiomyopathy     nonischemic (EF 25%)  . CAD (coronary artery disease)     nonobstructive CAD by cath 2009  . Obesity   . CVA (cerebral vascular accident)     CVA 2007 without residual deficit  . 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 Depression   . Erectile dysfunction   . DDD (degenerative disc disease)   . OSA (obstructive sleep apnea)   . Monoclonal gammopathy   . CRI (chronic renal insufficiency)     CRI (baseline creatinine 1.6)  . CKD (chronic kidney disease), stage III     moderate  . 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 Hypogonadotropic hypogonadism   . Polycythemia, secondary     improved/resolved  . Hypercholesterolemia   . Back pain  F/B [name]  . Monoclonal gammopathy of unknown significance     per [name]  . 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 Neck pain     F/B [name]  . PAF (paroxysmal atrial fibrillation) 06/04/10    Hx of, has device  .  History of diverticulitis of colon   . Type II or unspecified type diabetes mellitus with neurological manifestations, uncontrolled 2000  . Nonproliferative diabetic retinopathy NOS(362.03)   . Ventricular tachycardia 10/25/13    appropriate ICD shock, VT CL 230-240 msec  . Confusion 02/01/2014  . Hypertension   . Anginal pain   . Dysrhythmia   . AICD (automatic cardioverter/defibrillator) present   . Presence of permanent cardiac pacemaker   . Anxiety   . Shortness of breath dyspnea   . Peripheral vascular disease     2007    PSH: Past Surgical History  Procedure Laterality Date  . Gamma knife surgery for pituitary tumor    . Tonsillectomy    . Cardiac defibrillator placement  02/19/10    By JA.   . Right heart catheterization N/A 09/17/2013    Procedure: RIGHT HEART CATH;  Surgeon: [name], MD;  Location: [facility] CATH LAB;  Service: Cardiovascular;  Laterality: N/A;  . Cardiac catheterization    . Brain surgery    . Insert / replace / remove pacemaker    . Cardioversion N/A 02/14/2014    Procedure: CARDIOVERSION;  Surgeon: [name], MD;  Location: [place];  Service: Cardiovascular;  Laterality: N/A;  . Cardioversion N/A 05/02/2014    Procedure: CARDIOVERSION;  Surgeon: [name], MD;  Location: [facility] ENDOSCOPY;  Service: Cardiovascular;  Laterality: N/A;  . Right heart catheterization N/A 05/08/2014    Procedure: RIGHT HEART CATH;  Surgeon: [name], MD;  Location: [facility] CATH LAB;  Service: Cardiovascular;  Laterality: N/A;    ALLERGIES: Allergies  Allergen Reactions  . Bydureon [Exenatide] Other (See Comments)    sweating  . Losartan Potassium Other (See Comments)    insomnia    MEDICATIONS: Current Outpatient Prescriptions  Medication Sig Dispense Refill  . ALPRAZolam (XANAX) 0.5 MG tablet Take 0.5 mg by mouth 3 (three) times daily as needed for anxiety or sleep.    [name] amiodarone (PACERONE) 200 MG tablet Take 1 tablet (200 mg total) by mouth 2  (two) times daily. 60 tablet 3  . atorvastatin (LIPITOR) 40 MG tablet TAKE 1 TABLET DAILY (Patient taking differently: TAKE 1 TABLET BY MOUTH DAILY) 90 tablet 3  . digoxin (LANOXIN) 0.125 MG tablet Take 0.5 tablets (0.0625 mg total) by mouth daily. 90 tablet 3  . DULoxetine (CYMBALTA) 30 MG capsule Take 60 mg by mouth 2 (two) times daily.     [name] GLUCAGON EMERGENCY 1 MG injection Inject 1 mg as directed once. For hypotension    . hydrALAZINE (APRESOLINE) 25 MG tablet Take 1 tablet (25 mg total) by mouth 3 (three) times daily. 90 tablet 3  . insulin aspart (NOVOLOG) 100 UNIT/ML injection Inject 30 Units into the skin 3 (three) times daily before meals.     . isosorbide mononitrate (IMDUR) 30 MG 24 hr tablet Take 1 tablet (30 mg total) by mouth daily. 30 tablet 3  . ketorolac (ACULAR) 0.5 % ophthalmic solution Place 1 drop into the left eye 2 (two) times daily. Start 1 week prior to surgery  4  . LANTUS SOLOSTAR 100 UNIT/ML Solostar Pen  Inject 32 Units into the skin at bedtime.     [name] levothyroxine (SYNTHROID, LEVOTHROID) 25 MCG tablet Take 50 mcg by mouth daily before breakfast.   5  . Melatonin 10 MG TABS Take 10 mg by mouth at bedtime.    . metoprolol succinate (TOPROL-XL) 25 MG 24 hr tablet Take 1 tablet (25 mg total) by mouth 2 (two) times daily before a meal. 60 tablet 6  . milrinone (PRIMACOR) 20 MG/100ML SOLN infusion Inject 27.05 mcg/min into the vein continuous. 100 mL 6  . Multiple Vitamin (MULITIVITAMIN WITH MINERALS) TABS Take 1 tablet by mouth daily.    [name] ofloxacin (OCUFLOX) 0.3 % ophthalmic solution Place 1 drop into the right eye 2 (two) times daily. Start 72 hours prior to surgery  1  . oxyCODONE-acetaminophen (PERCOCET) 10-325 MG per tablet Take 1 tablet by mouth every 4 (four) hours as needed for pain. For pain    . potassium chloride 20 MEQ TBCR Take 20 mEq by mouth daily. 20 meq daily and an extra 20 meq if extra torsemide given. 45 tablet 6  . rivaroxaban (XARELTO) 15 MG TABS  tablet Take 1 tablet (15 mg total) by mouth daily with supper. 30 tablet 3  . senna (SENOKOT) 8.6 MG tablet Take 1 tablet by mouth at bedtime.     . torsemide (DEMADEX) 20 MG tablet Take 2 tablets (40 mg total) by mouth daily. 270 tablet 3  . zolpidem (AMBIEN) 5 MG tablet Take 1 tablet (5 mg total) by mouth at bedtime as needed for sleep. 30 tablet 0   No current facility-administered medications for this visit.    LABS: Lab Results  Component Value Date   WBC 7.6 05/27/2014   HGB 12.1* 05/27/2014   HCT 36.9* 05/27/2014   MCV 97.4 05/27/2014   PLT 207 05/27/2014      Component Value Date/Time   NA 134* 05/28/2014 0330   K 3.5 05/28/2014 0330   CL 98 05/28/2014 0330   CO2 27 05/28/2014 0330   GLUCOSE 241* 05/28/2014 0330   BUN 36* 05/28/2014 0330   CREATININE 1.83* 05/28/2014 0330   CALCIUM 8.5 05/28/2014 0330   GFRNONAA 38* 05/28/2014 0330   GFRAA 44* 05/28/2014 0330   Lab Results  Component Value Date   INR 2.69* 05/28/2014   INR 2.08* 05/08/2014   INR 1.47 02/01/2014   No results found for: PTT  SOCIAL HISTORY: History   Social History  . Marital Status: Married    Spouse Name: N/A  . Number of Children: 1  . Years of Education: N/A   Occupational History  . Disabled    Social History Main Topics  . Smoking status: Former Smoker    Types: Cigars    Quit date: 02/09/1983  . Smokeless tobacco: Never Used     Comment: 25 yrs ago  . Alcohol Use: No  . Drug Use: No  . Sexual Activity: No   Other Topics Concern  . Not on file   Social History Narrative    FAMILY HISTORY: Family History  Problem Relation Age of Onset  . Lung cancer    . Cancer Mother     skin  . Dementia Mother   . Depression Mother   . Heart disease Father   . Hypertension Father   . Lung cancer Father   . Obesity Father   . Obesity Sister   . Hypertension Sister   . Diabetes Brother   . Hypertension Brother   .  Heart disease Brother   . Obesity Brother   . Lung cancer  Paternal Uncle   . Diabetes Paternal Uncle     requiring leg amputations     REVIEW OF SYSTEMS: Reviewed with the patient and is included in the dental record.  DENTAL HISTORY: CHIEF COMPLAINT: Patient referred by Dr. Cyndia Bent for a dental consultation.  HPI: Larry [name] is a 64 year old male with cardiomyopathy and chronic systolic heart failure. Patient with anticipated LVAD placement. Patient is now seen as part of a medically necessary pre-LVAD placement dental protocol examination rule out dental infection that may affect the patient's systemic health and anticipated LVAD placement.  The patient currently denies acute toothaches, swellings, or abscesses. The patient was last seen by a dentist proximally 5 years ago to have a tooth pulled. Patient denies any complications from that dental extraction. Prior to that the patient had not been seen for an additional 5 years. Patient indicates that he saw Dr. Apolinar Junes at that time. Patient does have a history of periodontal therapy with scaling and root planing with a periodontist. Patient denies having any partial dentures. Patient did have a conversation with a prosthodontist about implant therapy and complete denture fabrication but could not afford the $20,000 required for that dental care by his report. He patient is not interested in implant therapy at this time. The patient is not interested in referral to a prosthodontist at this time.  DENTAL EXAMINATION: GENERAL: The patient is a well-developed, well-nourished male in no acute distress. HEAD AND NECK: There is no palpable submandibular lymphadenopathy. The patient denies acute TMJ symptoms. INTRAORAL EXAM: The patient has normal saliva. Patient has bilateral mandibular lingual tori. I do not see any evidence of oral abscess formation. DENTITION: The patient is missing tooth numbers 1, 2, 4, 8, 15, 16, 17, 18, 19, 24, 25, 26, 31, and 32. There are retained roots in the area tooth  #3. PERIODONTAL: Patient has chronic, advanced periodontal disease with plaque and calculus accumulations, generalized gingival recession, and generalized tooth mobility as per dental charting form. There is moderate to severe bone loss noted. DENTAL CARIES/SUBOPTIMAL RESTORATIONS: Dental caries are noted.  ENDODONTIC: Patient currently denies acute pulpitis symptoms. The patient does have multiple areas of periapical pathology and radiolucency. Patient apparently had a previous root canal therapy associated with tooth #3 that is now exposed to the oral environment. CROWN AND BRIDGE: There are no crown restorations noted. PROSTHODONTIC: Patient denies having any partial dentures. OCCLUSION: Patient has a poor occlusal scheme secondary to multiple missing teeth, retained root segments, and lack of replacement of the missing teeth with dental prostheses.  RADIOGRAPHIC INTERPRETATION: An orthopantogram was taken and supplemented with a full series of dental radiographs. There are multiple missing teeth. There are retained roots in the area tooth #3. There are multiple areas of periapical pathology and radiolucency. Dental caries are noted. There is supra-eruption and drifting of the unopposed teeth into the edentulous areas. There is moderate to severe bone loss. Multiple diastemas are noted. There are radiopacities of the lower arch consistent with bilateral mandibular lingual tori.  ASSESSMENTS: 1. Cardiomyopathy 2. Pre-LVAD dental protocol examination 3. Xarelto therapy with the risk for bleeding with invasive dental procedures 4. Multiple retained root segments 5. Chronic apical periodontitis 6. Dental caries 7. Bilateral mandibular lingual tori 8. Chronic periodontitis with bone loss 9. Generalized gingival recession 10. Generalized tooth mobility 11. Multiple missing teeth 12. Supra-eruption and drifting of the unopposed teeth into the edentulous areas  13. No history of partial  dentures 14. Poor occlusal scheme and malocclusion 15. Significant cardiovascular compromise with the risk for complications up to and including death with anticipated dental extraction procedures in the operating room with general anesthesia   PLAN/RECOMMENDATIONS: 1. I discussed the risks, benefits, and complications of various treatment options with the patient in relationship to his medical and dental conditions, cardiomyopathy, anticipated LVAD placement, anticoagulant therapy, and risk for endocarditis. We discussed various treatment options to include no treatment, multiple extractions with alveoloplasty, pre-prosthetic surgery as indicated, periodontal therapy, dental restorations, root canal therapy, crown and bridge therapy, implant therapy, and replacement of missing teeth as indicated. We also discussed referral to an oral surgeon if so desired. The patient currently wishes to proceed with extraction of remaining teeth with alveoloplasty and pre-prosthetic surgery as indicated in the operating room with general anesthesia. This has been scheduled to coordinate with the cardiac catheterization currently scheduled for 06/26/2014 with Dr. Benjamine Mola. The patient will then be admitted by Dr. Aundra Dubin with dental extractions procedures to occur on Thursday, 06/27/2014 at 7:30 AM. Patient will then be kept for an additional night of observation before discharge due to his history of sleep apnea. Xarelto will be withheld 2 days prior to the cardiac catheterization and continued to be held until after the dental extractions. Patient will then follow-up with a dentist of his choice for fabrication of upper lower complete dentures after adequate healing and once medically stable from the anticipated LVAD placement.   2. Discussion of findings with medical team and coordination of future medical and dental care as needed.  I spent in excess of  120 minutes during the conduct of this consultation and >50% of  this time involved direct face-to-face encounter for counseling and/or coordination of the patient's care.    Lenn Cal, DDS

## 2014-06-05 NOTE — Patient Instructions (Signed)
[place]    Department of Dental Medicine     DR. KULINSKI      HEART DISEASE AND MOUTH CARE:  FACTS:   If you have any infection in your mouth, it can infect your heart valve or LVAD.  If you heart valve or LVAD is infected, you will be seriously ill.  Infections in the mouth can be SILENT and do not always cause pain.  Examples of infections in the mouth are gum disease, dental cavities, and abscesses.  Some possible signs of infection are: Bad breath, bleeding gums, or teeth that are sensitive to sweets, hot, and/or cold. There are many other signs as well.  WHAT YOU HAVE TO DO:   Brush your teeth after meals and at bedtime. Spend at least 2 minutes brushing well, especially behind your back teeth and all around your teeth that stand alone. Brush at the gumline also.  Do not go to bed without brushing your teeth and flossing.  If you gums bleed when you brush or floss, do NOT stop brushing or flossing. It usually means that your gums need more attention and better cleaning.   If your Dentist or Dr. Enrique Sack gave you a prescription mouthwash to use, make sure to use it as directed. If you run out of the medication, get a refill at the pharmacy.   If you were given any other medications or directions by your Dentist, please follow them. If you did not understand the directions or forget what you were told, please call. We will be happy to refresh her memory.  If you need antibiotics before dental procedures, make sure you take them one hour prior to every dental visit as directed.   Get a dental checkup every 4-6 months in order to keep your mouth healthy, or to find and treat any new infection. You will most likely need your teeth cleaned or gums treated at the same time.  If you are not able to come in for your scheduled appointment, call your Dentist as soon as possible to reschedule.  If you have a problem in between dental visits, call your Dentist.

## 2014-06-07 ENCOUNTER — Telehealth (HOSPITAL_COMMUNITY): Payer: Self-pay | Admitting: Surgery

## 2014-06-07 NOTE — Telephone Encounter (Signed)
Placed a phone call to [facility] per Dr. Aundra Dubin regarding patient's lab results from Monday lab draw.  Per [facility] the labs were drawn and samples were rejected by the lab--however the labs have not been redrawn yet.  CBC and Cmet to be redrawn tomorrow --Saturday 06/08/14.  Dr. Aundra Dubin made aware.

## 2014-06-07 NOTE — Telephone Encounter (Signed)
LMTCB x 1 

## 2014-06-10 NOTE — Telephone Encounter (Signed)
Pt aware of results. Requests to see Dr Halford Chessman before any additional testing.  Pt scheduled for appt with [facility] on 07/03/14 at 2pm. Will send to Dr Halford Chessman as Juluis Rainier. Nothing further needed.

## 2014-06-18 ENCOUNTER — Telehealth (HOSPITAL_COMMUNITY): Payer: Self-pay | Admitting: Vascular Surgery

## 2014-06-18 NOTE — Telephone Encounter (Signed)
Nurse from [facility] nurse was unable to get blood from his PICC line from either [place] .Larry [name] PICC flushing fine and medication is infusing fine... Pt is going to be in the office tomorrow nurse wants to know if we can try to get BMET.Larry [name] PLEASE ADVISE

## 2014-06-18 NOTE — Telephone Encounter (Signed)
Pt is sch to see [place] in the AM will attempt to get labs then

## 2014-06-19 ENCOUNTER — Encounter: Payer: Self-pay | Admitting: Licensed Clinical Social Worker

## 2014-06-19 ENCOUNTER — Encounter (HOSPITAL_COMMUNITY): Payer: Self-pay

## 2014-06-19 ENCOUNTER — Ambulatory Visit (HOSPITAL_COMMUNITY)
Admission: RE | Admit: 2014-06-19 | Discharge: 2014-06-19 | Disposition: A | Discharge status: Home / Self Care | Payer: 59 | Source: Ambulatory Visit | Attending: Cardiology | Admitting: Cardiology

## 2014-06-19 ENCOUNTER — Telehealth (HOSPITAL_COMMUNITY): Payer: Self-pay | Admitting: Cardiology

## 2014-06-19 ENCOUNTER — Other Ambulatory Visit (HOSPITAL_COMMUNITY): Payer: Self-pay

## 2014-06-19 VITALS — BP 131/69 | HR 67 | Resp 18 | Wt 245.2 lb

## 2014-06-19 DIAGNOSIS — N183 Chronic kidney disease, stage 3 unspecified: Secondary | ICD-10-CM

## 2014-06-19 DIAGNOSIS — I2510 Atherosclerotic heart disease of native coronary artery without angina pectoris: Secondary | ICD-10-CM | POA: Diagnosis not present

## 2014-06-19 DIAGNOSIS — E785 Hyperlipidemia, unspecified: Secondary | ICD-10-CM | POA: Insufficient documentation

## 2014-06-19 DIAGNOSIS — F329 Major depressive disorder, single episode, unspecified: Secondary | ICD-10-CM | POA: Diagnosis not present

## 2014-06-19 DIAGNOSIS — Z87891 Personal history of nicotine dependence: Secondary | ICD-10-CM | POA: Diagnosis not present

## 2014-06-19 DIAGNOSIS — N189 Chronic kidney disease, unspecified: Secondary | ICD-10-CM | POA: Insufficient documentation

## 2014-06-19 DIAGNOSIS — R0602 Shortness of breath: Secondary | ICD-10-CM

## 2014-06-19 DIAGNOSIS — E039 Hypothyroidism, unspecified: Secondary | ICD-10-CM | POA: Insufficient documentation

## 2014-06-19 DIAGNOSIS — M545 Low back pain: Secondary | ICD-10-CM | POA: Diagnosis not present

## 2014-06-19 DIAGNOSIS — G4733 Obstructive sleep apnea (adult) (pediatric): Secondary | ICD-10-CM | POA: Diagnosis not present

## 2014-06-19 DIAGNOSIS — F419 Anxiety disorder, unspecified: Secondary | ICD-10-CM | POA: Insufficient documentation

## 2014-06-19 DIAGNOSIS — I480 Paroxysmal atrial fibrillation: Secondary | ICD-10-CM | POA: Diagnosis not present

## 2014-06-19 DIAGNOSIS — Z794 Long term (current) use of insulin: Secondary | ICD-10-CM | POA: Diagnosis not present

## 2014-06-19 DIAGNOSIS — Z7902 Long term (current) use of antithrombotics/antiplatelets: Secondary | ICD-10-CM | POA: Diagnosis not present

## 2014-06-19 DIAGNOSIS — E669 Obesity, unspecified: Secondary | ICD-10-CM | POA: Diagnosis not present

## 2014-06-19 DIAGNOSIS — D472 Monoclonal gammopathy: Secondary | ICD-10-CM | POA: Insufficient documentation

## 2014-06-19 DIAGNOSIS — I5022 Chronic systolic (congestive) heart failure: Secondary | ICD-10-CM | POA: Diagnosis present

## 2014-06-19 DIAGNOSIS — I429 Cardiomyopathy, unspecified: Secondary | ICD-10-CM | POA: Diagnosis not present

## 2014-06-19 DIAGNOSIS — T82898A Other specified complication of vascular prosthetic devices, implants and grafts, initial encounter: Secondary | ICD-10-CM

## 2014-06-19 DIAGNOSIS — Z8249 Family history of ischemic heart disease and other diseases of the circulatory system: Secondary | ICD-10-CM | POA: Insufficient documentation

## 2014-06-19 DIAGNOSIS — E119 Type 2 diabetes mellitus without complications: Secondary | ICD-10-CM | POA: Insufficient documentation

## 2014-06-19 DIAGNOSIS — I272 Other secondary pulmonary hypertension: Secondary | ICD-10-CM | POA: Insufficient documentation

## 2014-06-19 DIAGNOSIS — Z79899 Other long term (current) drug therapy: Secondary | ICD-10-CM | POA: Diagnosis not present

## 2014-06-19 DIAGNOSIS — Z8673 Personal history of transient ischemic attack (TIA), and cerebral infarction without residual deficits: Secondary | ICD-10-CM | POA: Diagnosis not present

## 2014-06-19 LAB — BASIC METABOLIC PANEL
ANION GAP: 14 (ref 5–15)
BUN: 31 mg/dL — ABNORMAL HIGH (ref 6–20)
CHLORIDE: 101 mmol/L (ref 101–111)
CO2: 22 mmol/L (ref 22–32)
Calcium: 9.1 mg/dL (ref 8.9–10.3)
Creatinine, Ser: 1.87 mg/dL — ABNORMAL HIGH (ref 0.61–1.24)
GFR calc Af Amer: 43 mL/min — ABNORMAL LOW (ref >60)
GFR calc non Af Amer: 37 mL/min — ABNORMAL LOW (ref >60)
Glucose, Bld: 136 mg/dL — ABNORMAL HIGH (ref 70–99)
POTASSIUM: 4.8 mmol/L (ref 3.5–5.1)
Sodium: 137 mmol/L (ref 135–145)

## 2014-06-19 LAB — HEPATIC FUNCTION PANEL
ALBUMIN: 3.6 g/dL (ref 3.5–5.0)
ALK PHOS: 82 U/L (ref 38–126)
ALT: 31 U/L (ref 17–63)
AST: 23 U/L (ref 15–41)
BILIRUBIN DIRECT: 0.3 mg/dL (ref 0.1–0.5)
BILIRUBIN TOTAL: 1.2 mg/dL (ref 0.3–1.2)
Indirect Bilirubin: 0.9 mg/dL (ref 0.3–0.9)
Total Protein: 8.3 g/dL — ABNORMAL HIGH (ref 6.5–8.1)

## 2014-06-19 LAB — BRAIN NATRIURETIC PEPTIDE: B Natriuretic Peptide: 738.9 pg/mL — ABNORMAL HIGH (ref 0.0–100.0)

## 2014-06-19 LAB — DIGOXIN LEVEL: DIGOXIN LVL: 0.8 ng/mL (ref 0.8–2.0)

## 2014-06-19 MED ORDER — TORSEMIDE 20 MG PO TABS: 60.0000 mg | ORAL_TABLET | Freq: Every day | ORAL | Status: DC

## 2014-06-19 MED ORDER — AMIODARONE HCL 200 MG PO TABS: 200.0000 mg | ORAL_TABLET | Freq: Every day | ORAL | Status: DC

## 2014-06-19 MED ORDER — HYDRALAZINE HCL 25 MG PO TABS: 37.5000 mg | ORAL_TABLET | Freq: Three times a day (TID) | ORAL | Status: DC

## 2014-06-19 MED ORDER — ALTEPLASE 100 MG IV SOLR: 2.0000 mg | Freq: Once | INTRAVENOUS | Status: DC

## 2014-06-19 MED ORDER — ISOSORBIDE MONONITRATE ER 60 MG PO TB24: 60.0000 mg | ORAL_TABLET | Freq: Every day | ORAL | Status: DC

## 2014-06-19 NOTE — Telephone Encounter (Signed)
Pt scheduled for R/L HC on 06/26/2014 with Dr. Aundra Dubin Cpt [phone] With pts current insurance- [employer] No pre cert required

## 2014-06-19 NOTE — Progress Notes (Signed)
Patient ID: Larry [name], male   DOB: [birth year], 64 y.o.   MRN: [id number] PCP: Dr. Rex Kras Endocrinologist: Dr Buddy Duty  64 yo with history of chronic systolic CHF (nonischemic cardiomyopathy), CKD, prior CVA, and paroxysmal atrial fibrillation.  Patient says that he has been known to have a low EF for years.  He has a [employer], [employer] ICD.  He had LHC in 2009 with nonobstructive disease. Last echo in 12/15 showed EF 20-25% with normal RV size and systolic function.     Patient had [place] 8/15 showing low cardiac output, mildly elevated PCWP and primarily pulmonary venous hypertension.  He was transitioned from Lasix to torsemide and started on digoxin.  Entresto was started and valsartan stopped.   Adenosine Cardiolite was done in 12/15 showing large area of apical scar but no ischemia.  This raised concern for possible ischemic cardiomyopathy.  He was admitted later in 12/15 with acute on chronic systolic CHF.  He also was noted to have gone into atrial fibrillation during this hospitalization, amiodarone was started in preparation for possible DCCV.  Troponin was mildly elevated in the hospital in the absence of chest pain, likely demand ischemia.  He was diuresed with IV Lasix and was discharged soon after.  He was cardioverted back to NSR on 02/14/14.  He was cardioverted again to NSR in 3/16.  He had RHC done 05/08/14 with mixed pulmonary hypertension and PWCP 25, CI 1.75.  CPX done 4/16 showed severe functional limitation.  In 4/16, he was started on home milrinone via PICC at 0.25 mcg/kg/min.  Co-ox increased from 55% => 72%.  Entresto stopped with elevated creatinine.   He returns for followup today.  He remains in NSR.  He remains on milrinone gtt.  He has been more short of breath over the last couple of weeks.  He is tired/dyspneic after walking 100-200 feet. Short of breath with steps. He is on a lower torsemide dose than in the past.  Weight is up 7 lbs.  No chest pain.  Stable low back pain, still difficult  to lie flat.  He sleeps on his side.  He has significant anxiety and has been seeing a psychologist Haematologist).   ECG: NSR, inferior/anterior/anterolateral Qs  Labs (2/15): LDL 103 Labs (3/15): K 4.1, creatinine 1.5, BNP 164 Labs (4/15): K 3.5, creatinine 3.3 => 1.9 => 2.1, BNP 96 Labs (4/15): K 4.3, creatinine 1.43, glucose 308 Labs (5/15): K 5.1 Creatinine 1.35  Labs (8/15): K 5 => 4.7 => 3.8, creatinine 1.19 => 1.57 => 1.6, digoxin 1.1 Labs (9/15): K 4.6, creatinine 1.32 => 1.9 => 1.1, digoxin 0.6 => 1.2 (not trough) Labs (10/15): K 4.5, creatinine 1.36, pro-BNP 1261 Labs (12/15): TSH 8.6 (elevated), free T3/free T4 normal, K 4.5, creatinine 1.71, digoxin 0.6, HCT 44.1, AST 38, ALT 44, TSH normal Labs (1/16): K 5.1, creatinine 1.96, LFTs normal, BNP 952 Labs (3/16): K 4.6, creatinine 2.06, LFTs normal, digoxin 1.8 (decreased digoxin to 0.0625) Labs (4/16): K 3.5, creatinine 1.83, LDL 37, HDL 28, HIV negative, lupus anticoagulant +.  Labs (5/16): K 4.6, creatinine 1.9  PMH: 1. Chronic systolic CHF: ? Nonischemic cardiomyopathy.  Most recent echo in 3/15 with EF 25-30%, moderate LV dilation, mild LVH, moderate diastolic dysfunction, IVC normal, mildly decreased RV systolic function.  Prior echo in 2011 also with EF 25-30%.  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  [employer] Jude ICD. CPX 05/2013: FVC 3.89 (82%), FEV1 2.99 (83%), FEV1/FVC 77%, Peak HR 118 (74% predicted max),  Peak VO2 12.4 (51% of predicted; when corrected to IBW =17.7), VE/VCO2 29.9, OUES 1.39, Peak RER 1.21.  RHC (8/15) with mean RA 6, PA 60/21 mean 36, mean PCWP 21, CI 1.81, PVR 3.6 WU.  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  Echo (12/15) with EF 20-25%, inferior akinesis, normal RV size and systolic function.  RHC (3/16) with mean RA 7, PA 76/24 mean 43, mean PCWP 25, CI 1.75, PVR 4.62. CPX (4/16) with RER 0.99, peak VO2 7.1,  slope 36 => severely decreased functional capacity though submaximal. Home milrinone at 0.25 mcg/kg/min begun 4/16.  2. Type II diabetes 3. CKD 4. Monoclonal gammopathy of uncertain etiology.  5. CVA 2007 6. Degenerative disc disease: Chronic low back pain.   7. Pituitary microadenoma: Nonfunctioning, s/p surgery in 2009.  8. OSA: 4/16 sleep study with severe OSA, needs Bipap 9. Depression 10. Hypothyroidism 11. Hyperlipidemia 12. Atrial fibrillation:   Patient is on Xarelto. DCCV 02/14/14 to NSR.  13. 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  Saw Dr Lindi Adie with hematology.   SH: Married, former cigar smoker, unemployed.   FH: Father with MI x 2 and CHF, strong history of CAD on his father's side.   ROS: All systems reviewed and negative except as per HPI.   Current Outpatient Prescriptions  Medication Sig Dispense Refill  . ALPRAZolam (XANAX) 0.5 MG tablet Take 0.5 mg by mouth 3 (three) times daily as needed for anxiety or sleep.    [name] amiodarone (PACERONE) 200 MG tablet Take 1 tablet (200 mg total) by mouth daily. 30 tablet 3  . atorvastatin (LIPITOR) 40 MG tablet TAKE 1 TABLET DAILY (Patient taking differently: TAKE 1 TABLET BY MOUTH DAILY) 90 tablet 3  . digoxin (LANOXIN) 0.125 MG tablet Take 0.5 tablets (0.0625 mg total) by mouth daily. 90 tablet 3  . DULoxetine (CYMBALTA) 30 MG capsule Take 60 mg by mouth 2 (two) times daily.     [name] GLUCAGON EMERGENCY 1 MG injection Inject 1 mg as directed once. For hypotension    . hydrALAZINE (APRESOLINE) 25 MG tablet Take 1.5 tablets (37.5 mg total) by mouth 3 (three) times daily. 135 tablet 3  . insulin aspart (NOVOLOG) 100 UNIT/ML injection Inject 30 Units into the skin 3 (three) times daily before meals.     . isosorbide mononitrate (IMDUR) 60 MG 24 hr tablet Take 1 tablet (60 mg total) by mouth daily. 30 tablet 3  . ketorolac (ACULAR) 0.5 % ophthalmic solution Place 1 drop into the left eye 2 (two) times  daily. Start 1 week prior to surgery  4  . LANTUS SOLOSTAR 100 UNIT/ML Solostar Pen Inject 32 Units into the skin at bedtime.     [name] levothyroxine (SYNTHROID, LEVOTHROID) 25 MCG tablet Take 50 mcg by mouth daily before breakfast.   5  . Melatonin 10 MG TABS Take 10 mg by mouth at bedtime.    . metoprolol succinate (TOPROL-XL) 25 MG 24 hr tablet Take 1 tablet (25 mg total) by mouth 2 (two) times daily before a meal. 60 tablet 6  . milrinone (PRIMACOR) 20 MG/100ML SOLN infusion Inject 27.05 mcg/min into the vein continuous. 100 mL 6  . Multiple Vitamin (MULITIVITAMIN WITH MINERALS) TABS Take 1 tablet by mouth daily.    [name] ofloxacin (OCUFLOX) 0.3 % ophthalmic solution Place 1 drop into the right eye 2 (two) times daily. Start 72 hours prior  to surgery  1  . oxyCODONE-acetaminophen (PERCOCET) 10-325 MG per tablet Take 1 tablet by mouth every 4 (four) hours as needed for pain. For pain    . potassium chloride 20 MEQ TBCR Take 20 mEq by mouth daily. 20 meq daily and an extra 20 meq if extra torsemide given. 45 tablet 6  . rivaroxaban (XARELTO) 15 MG TABS tablet Take 1 tablet (15 mg total) by mouth daily with supper. 30 tablet 3  . senna (SENOKOT) 8.6 MG tablet Take 1 tablet by mouth at bedtime.     . torsemide (DEMADEX) 20 MG tablet Take 3 tablets (60 mg total) by mouth daily. 270 tablet 3  . zolpidem (AMBIEN) 5 MG tablet Take 1 tablet (5 mg total) by mouth at bedtime as needed for sleep. 30 tablet 0   No current facility-administered medications for this encounter.   Facility-Administered Medications Ordered in Other Encounters  Medication Dose Route Frequency Provider Last Rate Last Dose  . [START ON 06/20/2014] alteplase (ACTIVASE) injection 2 mg  2 mg Intracatheter Once [name], MD        Filed Vitals:   06/19/14 0927  BP: 131/69  Pulse: 67  Resp: 18  Weight: 245 lb 4 oz (111.245 kg)  SpO2: 91%   General: NAD Wife present  Neck: Thick, JVP 9-10 cm. No thyromegaly or thyroid nodule.   Lungs: Clear to auscultation bilaterally with normal respiratory effort. CV: Lateral PMI.  Heart regular S1/S2, no S3/S4, no murmur.  1+ ankle edema.  No carotid bruit.  Normal pedal pulses.  Abdomen: Obese, Soft, tender to palpation, obese, no hepatosplenomegaly, non-distended Skin: Intact without lesions or rashes.  Neurologic: Alert and oriented x 3.  Psych: Flat affect. Extremities: No clubbing or cyanosis.   Assessment/plan: 1. Chronic systolic CHF: Cardiomyopathy x years.  Thought to be nonischemic but Cardiolite in 12/15 showed apical scar (no ischemia).  EF 20-25% with normal RV size and systolic function on 83/66 echo. Has St Jude ICD, narrow QRS at baseline, more recently nonspecific IVCD so has not been a good CRT candidate.  RHC in 3/16 showed low cardiac output with mixed pulmonary venous and pulmonary arterial hypertension (Downs may be due to vascular remodeling over time in the face of persistently elevated left atrial pressure).  PCWP was elevated but not markedly so.  RA pressure was normal, suggesting preserved RV function.  CPX in 4/16 showed severe functional impairment but was submaximal.  He has started on milrinone gtt in 4/16 with some improvement in symptoms and creatinine.  Currently NYHA class III symptoms => he appears more volume overloaded on exam today (he is on a lower dose of torsemide than in the past). - We spent additional time discussing LVAD today.  He is not a transplant candidate at this point (weight, pulmonary hypertension). He also has significant barriers to LVAD (elevated creatinine, severe anxiety, severe low back pain, pulmonary hypertension).  He has been on milrinone for about a month now.  I will bring him in next week for repeat RHC to see if PA pressure has improved.  Revatio would be an option if PA pressure remains high.  His RV function appears preserved by echo and by last RHC assessment.  I will also do coronary angiography along with RHC to rule  out significant new coronary disease prior to LVAD (last cath in 2009, he had possible evidence for scar on Cardiolite).  We discussed risks/benefits of cath => he understands these (especially risk  of renal function worsening) and is willing to go forward with the study.  He will hold Xarelto 2 days prior to cath.  - Increase torsemide to 60 mg daily. BMET/BNP today and repeat BMET in 10 days.    - Continue current digoxin and Toprol XL - Increase hydralazine to 37.5 mg tid, increase Imdur to 60 daily.  - Off Entresto with elevated creatinine.   2. CKD: BMET today. Now off Entresto.  Renal function now < 2 on milrinone but still high enough that it is concerning in terms of LVAD placement.   3. Atrial fibrillation: He is now out of atrial fibrillation.  He is on Xarelto and amiodarone 200 mg bid.  Can decrease amiodarone to 200 mg daily. Check LFTs today.  TSH high but followed by endocrinology for known hypothyroidism.  There is a good chance that he will go back into atrial fibrillation on milrinone, but I do not think that maintenance of NSR over the long-term is going to do enough to avoid advanced therapies.  4. Obesity:  Needs to lose weight.  Asked him to cut back on his portions and try low carb diet.   5. DM2: Continue to follow with endocrinology.   6. Depression/anxiety:  Doing better now that he is seeing a psychologist.  On Xanax and Cymbalta.   7. Fatigue: In addition to CHF, I think that OSA (that is untreated as he has had a hard time with CPAP) and depression play a large role. Severe OSA on sleep study, he needs to be set up with Bipap.     8. CAD: Though to have nonischemic CMP, but Cardiolite showed large apical scar.  No chest pain.  Continue statin.  Not on ASA given Xarelto use.  Plan coronary angiography prior to LVAD.    Loralie Champagne  06/19/2014

## 2014-06-19 NOTE — Patient Instructions (Signed)
DECREASE Amiodarone to 200mg  (1 tablet) once daily.  INCREASE Hydralazine to 37.5mg  (1.5 tablets) three times daily.  INCREASE Imdur to 60mg  once daily.  INCREASE Torsemide to 40mg  twice daily TODAY ONLY, then tomorrow to 60mg  (3 tablets) once daily.  Follow up 2 weeks.  Do the following things EVERYDAY: 1) Weigh yourself in the morning before breakfast. Write it down and keep it in a log. 2) Take your medicines as prescribed 3) Eat low salt foods-Limit salt (sodium) to 2000 mg per day.  4) Stay as active as you can everyday 5) Limit all fluids for the day to less than 2 liters

## 2014-06-20 ENCOUNTER — Ambulatory Visit (HOSPITAL_COMMUNITY)
Admission: RE | Admit: 2014-06-20 | Discharge: 2014-06-20 | Disposition: A | Discharge status: Home / Self Care | Payer: 59 | Source: Ambulatory Visit | Attending: Cardiology | Admitting: Cardiology

## 2014-06-20 ENCOUNTER — Encounter: Payer: Self-pay | Admitting: Licensed Clinical Social Worker

## 2014-06-20 ENCOUNTER — Telehealth (HOSPITAL_COMMUNITY): Payer: Self-pay | Admitting: *Deleted

## 2014-06-20 DIAGNOSIS — Z4689 Encounter for fitting and adjustment of other specified devices: Secondary | ICD-10-CM | POA: Diagnosis not present

## 2014-06-20 MED ORDER — ALTEPLASE 100 MG IV SOLR
2.0000 mg | Freq: Once | INTRAVENOUS | Status: AC
  Administered 2014-06-20: 2 mg
  Filled 2014-06-20 (×3): qty 2

## 2014-06-20 NOTE — Progress Notes (Signed)
CSW met with patient and wife in the clinic to complete the LVAD Assessment yesterday. CSW introduced patient and wife to current LVAD patient Larry [name] and LVAD caregiver Larry [name] with their permission. Patient and wife appeared grateful for the opportunity to ask questions and meet with a LVAD patient. CSW will enter complete LVAD assessment in separate encounter. Larry [name], Larry [name]

## 2014-06-20 NOTE — Telephone Encounter (Signed)
Pts wife said today during pts picc line [place] she was told he should use saline instead of heparin bc he has a procedure next week (only procedure i saw was a heart cath). I messaged Melissa with [facility] about what to do about getting them saline or if it was even needed. Awaiting response from [facility].

## 2014-06-21 ENCOUNTER — Telehealth: Payer: Self-pay | Admitting: Pulmonary Disease

## 2014-06-21 NOTE — Telephone Encounter (Signed)
lmtcb x1 for pt's wife. 

## 2014-06-24 ENCOUNTER — Ambulatory Visit (INDEPENDENT_AMBULATORY_CARE_PROVIDER_SITE_OTHER): Payer: 59 | Admitting: *Deleted

## 2014-06-24 ENCOUNTER — Encounter: Payer: Self-pay | Admitting: Internal Medicine

## 2014-06-24 DIAGNOSIS — I472 Ventricular tachycardia, unspecified: Secondary | ICD-10-CM

## 2014-06-24 DIAGNOSIS — I5022 Chronic systolic (congestive) heart failure: Secondary | ICD-10-CM | POA: Diagnosis not present

## 2014-06-24 NOTE — Telephone Encounter (Signed)
lmtcb x2 for pt's wife. 

## 2014-06-24 NOTE — Progress Notes (Signed)
Remote ICD transmission.   

## 2014-06-25 NOTE — Telephone Encounter (Signed)
lmtcb for pt.  

## 2014-06-26 ENCOUNTER — Encounter (HOSPITAL_COMMUNITY)
Admission: RE | Disposition: A | Discharge status: Home / Self Care | Payer: 59 | Source: Ambulatory Visit | Attending: Internal Medicine

## 2014-06-26 ENCOUNTER — Inpatient Hospital Stay (HOSPITAL_COMMUNITY)
Admission: RE | Admit: 2014-06-26 | Discharge: 2014-06-29 | DRG: 287 | Disposition: A | Discharge status: Home / Self Care | Payer: 59 | Source: Ambulatory Visit | Attending: Internal Medicine | Admitting: Internal Medicine

## 2014-06-26 ENCOUNTER — Encounter (HOSPITAL_COMMUNITY): Payer: Self-pay | Admitting: Certified Registered Nurse Anesthetist

## 2014-06-26 ENCOUNTER — Ambulatory Visit (HOSPITAL_COMMUNITY): Admission: RE | Admit: 2014-06-26 | Payer: 59 | Source: Ambulatory Visit | Admitting: Cardiology

## 2014-06-26 ENCOUNTER — Encounter (HOSPITAL_COMMUNITY): Admission: RE | Payer: Self-pay | Source: Ambulatory Visit

## 2014-06-26 ENCOUNTER — Encounter (HOSPITAL_COMMUNITY): Payer: Self-pay | Admitting: Cardiology

## 2014-06-26 DIAGNOSIS — F419 Anxiety disorder, unspecified: Secondary | ICD-10-CM | POA: Diagnosis present

## 2014-06-26 DIAGNOSIS — Z79891 Long term (current) use of opiate analgesic: Secondary | ICD-10-CM

## 2014-06-26 DIAGNOSIS — Z8249 Family history of ischemic heart disease and other diseases of the circulatory system: Secondary | ICD-10-CM

## 2014-06-26 DIAGNOSIS — I2510 Atherosclerotic heart disease of native coronary artery without angina pectoris: Secondary | ICD-10-CM | POA: Diagnosis present

## 2014-06-26 DIAGNOSIS — I270 Primary pulmonary hypertension: Secondary | ICD-10-CM | POA: Diagnosis not present

## 2014-06-26 DIAGNOSIS — Z7901 Long term (current) use of anticoagulants: Secondary | ICD-10-CM

## 2014-06-26 DIAGNOSIS — Z87891 Personal history of nicotine dependence: Secondary | ICD-10-CM

## 2014-06-26 DIAGNOSIS — N183 Chronic kidney disease, stage 3 (moderate): Secondary | ICD-10-CM | POA: Diagnosis present

## 2014-06-26 DIAGNOSIS — I480 Paroxysmal atrial fibrillation: Secondary | ICD-10-CM | POA: Diagnosis present

## 2014-06-26 DIAGNOSIS — Z794 Long term (current) use of insulin: Secondary | ICD-10-CM

## 2014-06-26 DIAGNOSIS — N189 Chronic kidney disease, unspecified: Secondary | ICD-10-CM | POA: Diagnosis present

## 2014-06-26 DIAGNOSIS — E119 Type 2 diabetes mellitus without complications: Secondary | ICD-10-CM | POA: Diagnosis present

## 2014-06-26 DIAGNOSIS — G4733 Obstructive sleep apnea (adult) (pediatric): Secondary | ICD-10-CM | POA: Diagnosis present

## 2014-06-26 DIAGNOSIS — E039 Hypothyroidism, unspecified: Secondary | ICD-10-CM | POA: Diagnosis present

## 2014-06-26 DIAGNOSIS — E785 Hyperlipidemia, unspecified: Secondary | ICD-10-CM | POA: Diagnosis present

## 2014-06-26 DIAGNOSIS — I129 Hypertensive chronic kidney disease with stage 1 through stage 4 chronic kidney disease, or unspecified chronic kidney disease: Secondary | ICD-10-CM | POA: Diagnosis present

## 2014-06-26 DIAGNOSIS — I5022 Chronic systolic (congestive) heart failure: Secondary | ICD-10-CM | POA: Diagnosis present

## 2014-06-26 DIAGNOSIS — I5023 Acute on chronic systolic (congestive) heart failure: Secondary | ICD-10-CM | POA: Diagnosis present

## 2014-06-26 DIAGNOSIS — Z8673 Personal history of transient ischemic attack (TIA), and cerebral infarction without residual deficits: Secondary | ICD-10-CM | POA: Diagnosis not present

## 2014-06-26 DIAGNOSIS — I429 Cardiomyopathy, unspecified: Secondary | ICD-10-CM | POA: Diagnosis present

## 2014-06-26 DIAGNOSIS — I272 Other secondary pulmonary hypertension: Secondary | ICD-10-CM | POA: Diagnosis present

## 2014-06-26 DIAGNOSIS — Z79899 Other long term (current) drug therapy: Secondary | ICD-10-CM | POA: Diagnosis not present

## 2014-06-26 DIAGNOSIS — E669 Obesity, unspecified: Secondary | ICD-10-CM | POA: Diagnosis present

## 2014-06-26 DIAGNOSIS — F329 Major depressive disorder, single episode, unspecified: Secondary | ICD-10-CM | POA: Diagnosis present

## 2014-06-26 DIAGNOSIS — M545 Low back pain: Secondary | ICD-10-CM | POA: Diagnosis present

## 2014-06-26 DIAGNOSIS — I509 Heart failure, unspecified: Secondary | ICD-10-CM | POA: Diagnosis not present

## 2014-06-26 HISTORY — DX: Post-traumatic stress disorder, unspecified: F43.10

## 2014-06-26 HISTORY — PX: CARDIAC CATHETERIZATION: SHX172

## 2014-06-26 HISTORY — DX: Pulmonary hypertension, unspecified: I27.20

## 2014-06-26 HISTORY — DX: Hypothyroidism, unspecified: E03.9

## 2014-06-26 HISTORY — DX: Other cardiomyopathies: I42.8

## 2014-06-26 HISTORY — DX: Raised antibody titer: R76.0

## 2014-06-26 LAB — POCT I-STAT 3, VENOUS BLOOD GAS (G3P V)
ACID-BASE EXCESS: 1 mmol/L (ref 0.0–2.0)
Acid-Base Excess: 1 mmol/L (ref 0.0–2.0)
BICARBONATE: 26.2 meq/L — AB (ref 20.0–24.0)
Bicarbonate: 26.1 mEq/L — ABNORMAL HIGH (ref 20.0–24.0)
O2 Saturation: 47 %
O2 Saturation: 50 %
PH VEN: 7.403 — AB (ref 7.250–7.300)
PO2 VEN: 26 mmHg — AB (ref 30.0–45.0)
PO2 VEN: 27 mmHg — AB (ref 30.0–45.0)
TCO2: 27 mmol/L (ref 0–100)
TCO2: 27 mmol/L (ref 0–100)
pCO2, Ven: 41.4 mmHg — ABNORMAL LOW (ref 45.0–50.0)
pCO2, Ven: 42 mmHg — ABNORMAL LOW (ref 45.0–50.0)
pH, Ven: 7.408 — ABNORMAL HIGH (ref 7.250–7.300)

## 2014-06-26 LAB — CUP PACEART REMOTE DEVICE CHECK
Brady Statistic RV Percent Paced: 49 %
Lead Channel Setting Pacing Amplitude: 2 V
Lead Channel Setting Pacing Amplitude: 2.5 V
Lead Channel Setting Pacing Pulse Width: 0.5 ms
Lead Channel Setting Sensing Sensitivity: 0.5 mV
MDC IDC MSMT LEADCHNL RA SENSING INTR AMPL: 1.6 mV
MDC IDC MSMT LEADCHNL RV SENSING INTR AMPL: 11.3 mV
MDC IDC PG SERIAL: 622169
MDC IDC SESS DTM: 20160518093511
MDC IDC SET ZONE DETECTION INTERVAL: 270 ms
MDC IDC SET ZONE DETECTION INTERVAL: 335 ms
MDC IDC STAT BRADY RA PERCENT PACED: 39 %

## 2014-06-26 LAB — GLUCOSE, CAPILLARY
GLUCOSE-CAPILLARY: 215 mg/dL — AB (ref 65–99)
GLUCOSE-CAPILLARY: 115 mg/dL — AB (ref 65–99)
Glucose-Capillary: 187 mg/dL — ABNORMAL HIGH (ref 65–99)
Glucose-Capillary: 252 mg/dL — ABNORMAL HIGH (ref 65–99)

## 2014-06-26 LAB — BASIC METABOLIC PANEL
Anion gap: 11 (ref 5–15)
BUN: 33 mg/dL — ABNORMAL HIGH (ref 6–20)
CHLORIDE: 97 mmol/L — AB (ref 101–111)
CO2: 26 mmol/L (ref 22–32)
Calcium: 9 mg/dL (ref 8.9–10.3)
Creatinine, Ser: 2.17 mg/dL — ABNORMAL HIGH (ref 0.61–1.24)
GFR calc Af Amer: 36 mL/min — ABNORMAL LOW (ref >60)
GFR calc non Af Amer: 31 mL/min — ABNORMAL LOW (ref >60)
Glucose, Bld: 240 mg/dL — ABNORMAL HIGH (ref 65–99)
POTASSIUM: 3.9 mmol/L (ref 3.5–5.1)
SODIUM: 134 mmol/L — AB (ref 135–145)

## 2014-06-26 LAB — CBC
HEMATOCRIT: 34 % — AB (ref 39.0–52.0)
HEMOGLOBIN: 10.9 g/dL — AB (ref 13.0–17.0)
MCH: 32.3 pg (ref 26.0–34.0)
MCHC: 32.1 g/dL (ref 30.0–36.0)
MCV: 100.9 fL — ABNORMAL HIGH (ref 78.0–100.0)
Platelets: 286 10*3/uL (ref 150–400)
RBC: 3.37 MIL/uL — ABNORMAL LOW (ref 4.22–5.81)
RDW: 16.4 % — ABNORMAL HIGH (ref 11.5–15.5)
WBC: 7.1 10*3/uL (ref 4.0–10.5)

## 2014-06-26 LAB — POCT ACTIVATED CLOTTING TIME: Activated Clotting Time: 153 seconds

## 2014-06-26 SURGERY — RIGHT/LEFT HEART CATH AND CORONARY ANGIOGRAPHY

## 2014-06-26 SURGERY — LEFT AND RIGHT HEART CATHETERIZATION WITH CORONARY ANGIOGRAM
Anesthesia: LOCAL

## 2014-06-26 MED ORDER — ATORVASTATIN CALCIUM 40 MG PO TABS
40.0000 mg | ORAL_TABLET | Freq: Every day | ORAL | Status: DC
  Administered 2014-06-27 – 2014-06-29 (×3): 40 mg via ORAL
  Filled 2014-06-26 (×3): qty 1

## 2014-06-26 MED ORDER — DULOXETINE HCL 60 MG PO CPEP
60.0000 mg | ORAL_CAPSULE | Freq: Two times a day (BID) | ORAL | Status: DC
  Administered 2014-06-26 – 2014-06-29 (×6): 60 mg via ORAL
  Filled 2014-06-26 (×7): qty 1

## 2014-06-26 MED ORDER — SODIUM CHLORIDE 0.9 % IJ SOLN: 3.0000 mL | INTRAMUSCULAR | Status: DC | PRN

## 2014-06-26 MED ORDER — SENNA 8.6 MG PO TABS
1.0000 | ORAL_TABLET | Freq: Every day | ORAL | Status: DC
  Filled 2014-06-26 (×4): qty 1

## 2014-06-26 MED ORDER — ADULT MULTIVITAMIN W/MINERALS CH
1.0000 | ORAL_TABLET | Freq: Every day | ORAL | Status: DC
  Administered 2014-06-27 – 2014-06-29 (×3): 1 via ORAL
  Filled 2014-06-26 (×3): qty 1

## 2014-06-26 MED ORDER — INSULIN GLARGINE 100 UNIT/ML ~~LOC~~ SOLN
32.0000 [IU] | Freq: Every day | SUBCUTANEOUS | Status: DC
  Administered 2014-06-27: 15 [IU] via SUBCUTANEOUS
  Administered 2014-06-27 – 2014-06-28 (×2): 32 [IU] via SUBCUTANEOUS
  Filled 2014-06-26 (×4): qty 0.32

## 2014-06-26 MED ORDER — OXYCODONE HCL 5 MG PO TABS
5.0000 mg | ORAL_TABLET | ORAL | Status: DC | PRN
  Administered 2014-06-26 – 2014-06-28 (×7): 5 mg via ORAL
  Filled 2014-06-26 (×7): qty 1

## 2014-06-26 MED ORDER — LIDOCAINE HCL (PF) 1 % IJ SOLN
INTRAMUSCULAR | Status: AC
  Filled 2014-06-26: qty 30

## 2014-06-26 MED ORDER — MELATONIN 10 MG PO TABS: 10.0000 mg | ORAL_TABLET | Freq: Every day | ORAL | Status: DC

## 2014-06-26 MED ORDER — HYDRALAZINE HCL 25 MG PO TABS
37.5000 mg | ORAL_TABLET | Freq: Three times a day (TID) | ORAL | Status: DC
  Administered 2014-06-26: 37.5 mg via ORAL
  Filled 2014-06-26 (×3): qty 1.5

## 2014-06-26 MED ORDER — OFLOXACIN 0.3 % OP SOLN
1.0000 [drp] | Freq: Two times a day (BID) | OPHTHALMIC | Status: DC
  Administered 2014-06-26 – 2014-06-29 (×6): 1 [drp] via OPHTHALMIC
  Filled 2014-06-26: qty 5

## 2014-06-26 MED ORDER — ALPRAZOLAM 0.5 MG PO TABS: 0.5000 mg | ORAL_TABLET | Freq: Three times a day (TID) | ORAL | Status: DC | PRN

## 2014-06-26 MED ORDER — ACETAMINOPHEN 325 MG PO TABS: 650.0000 mg | ORAL_TABLET | ORAL | Status: DC | PRN

## 2014-06-26 MED ORDER — HEPARIN SODIUM (PORCINE) 1000 UNIT/ML IJ SOLN
INTRAMUSCULAR | Status: AC
  Filled 2014-06-26: qty 1

## 2014-06-26 MED ORDER — SODIUM CHLORIDE 0.9 % IV SOLN: INTRAVENOUS | Status: DC

## 2014-06-26 MED ORDER — HYDRALAZINE HCL 50 MG PO TABS
50.0000 mg | ORAL_TABLET | Freq: Three times a day (TID) | ORAL | Status: DC
  Administered 2014-06-26 – 2014-06-27 (×4): 50 mg via ORAL
  Filled 2014-06-26 (×7): qty 1

## 2014-06-26 MED ORDER — OXYCODONE-ACETAMINOPHEN 5-325 MG PO TABS
1.0000 | ORAL_TABLET | ORAL | Status: DC | PRN
  Administered 2014-06-26 – 2014-06-29 (×9): 1 via ORAL
  Filled 2014-06-26 (×9): qty 1

## 2014-06-26 MED ORDER — FENTANYL CITRATE (PF) 100 MCG/2ML IJ SOLN
INTRAMUSCULAR | Status: DC | PRN
  Administered 2014-06-26 (×3): 25 ug via INTRAVENOUS

## 2014-06-26 MED ORDER — SODIUM CHLORIDE 0.9 % IV SOLN: 250.0000 mL | INTRAVENOUS | Status: DC | PRN

## 2014-06-26 MED ORDER — DIGOXIN 0.0625 MG HALF TABLET
0.0625 mg | ORAL_TABLET | Freq: Every day | ORAL | Status: DC
  Administered 2014-06-27 – 2014-06-29 (×3): 0.0625 mg via ORAL
  Filled 2014-06-26 (×3): qty 1

## 2014-06-26 MED ORDER — ASPIRIN 81 MG PO CHEW: 324.0000 mg | CHEWABLE_TABLET | ORAL | Status: DC

## 2014-06-26 MED ORDER — MILRINONE IN DEXTROSE 20 MG/100ML IV SOLN
0.3750 ug/kg/min | INTRAVENOUS | Status: DC
  Administered 2014-06-26 – 2014-06-29 (×8): 0.375 ug/kg/min via INTRAVENOUS
  Filled 2014-06-26 (×10): qty 100

## 2014-06-26 MED ORDER — AMIODARONE HCL 200 MG PO TABS
200.0000 mg | ORAL_TABLET | Freq: Every day | ORAL | Status: DC
  Administered 2014-06-27 – 2014-06-29 (×3): 200 mg via ORAL
  Filled 2014-06-26 (×3): qty 1

## 2014-06-26 MED ORDER — INSULIN ASPART 100 UNIT/ML ~~LOC~~ SOLN
0.0000 [IU] | Freq: Three times a day (TID) | SUBCUTANEOUS | Status: DC
  Administered 2014-06-26: 5 [IU] via SUBCUTANEOUS
  Administered 2014-06-26: 8 [IU] via SUBCUTANEOUS
  Administered 2014-06-27 – 2014-06-29 (×6): 3 [IU] via SUBCUTANEOUS
  Filled 2014-06-26: qty 0.15

## 2014-06-26 MED ORDER — RIVAROXABAN 15 MG PO TABS: 15.0000 mg | ORAL_TABLET | Freq: Every day | ORAL | Status: DC

## 2014-06-26 MED ORDER — MIDAZOLAM HCL 2 MG/2ML IJ SOLN
INTRAMUSCULAR | Status: DC | PRN
  Administered 2014-06-26: 1 mg via INTRAVENOUS

## 2014-06-26 MED ORDER — MIDAZOLAM HCL 2 MG/2ML IJ SOLN
INTRAMUSCULAR | Status: AC
  Filled 2014-06-26: qty 2

## 2014-06-26 MED ORDER — LEVOTHYROXINE SODIUM 50 MCG PO TABS
50.0000 ug | ORAL_TABLET | Freq: Every day | ORAL | Status: DC
  Administered 2014-06-27 – 2014-06-29 (×3): 50 ug via ORAL
  Filled 2014-06-26 (×4): qty 1

## 2014-06-26 MED ORDER — SENNOSIDES 8.6 MG PO TABS: 1.0000 | ORAL_TABLET | Freq: Every day | ORAL | Status: DC

## 2014-06-26 MED ORDER — HEPARIN (PORCINE) IN NACL 2-0.9 UNIT/ML-% IJ SOLN
INTRAMUSCULAR | Status: AC
  Filled 2014-06-26: qty 1000

## 2014-06-26 MED ORDER — FUROSEMIDE 10 MG/ML IJ SOLN
10.0000 mg/h | INTRAVENOUS | Status: DC
  Administered 2014-06-26 – 2014-06-27 (×2): 10 mg/h via INTRAVENOUS
  Filled 2014-06-26 (×5): qty 25

## 2014-06-26 MED ORDER — INSULIN ASPART 100 UNIT/ML ~~LOC~~ SOLN
30.0000 [IU] | Freq: Three times a day (TID) | SUBCUTANEOUS | Status: DC
  Administered 2014-06-26 – 2014-06-29 (×9): 30 [IU] via SUBCUTANEOUS

## 2014-06-26 MED ORDER — SODIUM CHLORIDE 0.9 % IJ SOLN: 3.0000 mL | Freq: Two times a day (BID) | INTRAMUSCULAR | Status: DC

## 2014-06-26 MED ORDER — RIVAROXABAN 15 MG PO TABS
15.0000 mg | ORAL_TABLET | Freq: Every day | ORAL | Status: DC
  Administered 2014-06-26: 15 mg via ORAL
  Filled 2014-06-26 (×2): qty 1

## 2014-06-26 MED ORDER — INSULIN GLARGINE 100 UNIT/ML SOLOSTAR PEN: 32.0000 [IU] | PEN_INJECTOR | Freq: Every day | SUBCUTANEOUS | Status: DC

## 2014-06-26 MED ORDER — KETOROLAC TROMETHAMINE 0.5 % OP SOLN
1.0000 [drp] | Freq: Two times a day (BID) | OPHTHALMIC | Status: DC
  Administered 2014-06-26 – 2014-06-29 (×6): 1 [drp] via OPHTHALMIC
  Filled 2014-06-26: qty 3

## 2014-06-26 MED ORDER — HYDRALAZINE HCL 50 MG PO TABS
50.0000 mg | ORAL_TABLET | Freq: Once | ORAL | Status: AC
  Administered 2014-06-26: 25 mg via ORAL
  Filled 2014-06-26: qty 1

## 2014-06-26 MED ORDER — HEPARIN SODIUM (PORCINE) 1000 UNIT/ML IJ SOLN
INTRAMUSCULAR | Status: DC | PRN
  Administered 2014-06-26: 5000 [IU] via INTRAVENOUS

## 2014-06-26 MED ORDER — INSULIN ASPART 100 UNIT/ML ~~LOC~~ SOLN
SUBCUTANEOUS | Status: AC
  Filled 2014-06-26: qty 1

## 2014-06-26 MED ORDER — SODIUM CHLORIDE 0.9 % IJ SOLN
3.0000 mL | Freq: Two times a day (BID) | INTRAMUSCULAR | Status: DC
Start: 2014-06-26 — End: 2014-06-26

## 2014-06-26 MED ORDER — SODIUM CHLORIDE 0.9 % IJ SOLN
3.0000 mL | Freq: Two times a day (BID) | INTRAMUSCULAR | Status: DC
  Administered 2014-06-26: 3 mL via INTRAVENOUS

## 2014-06-26 MED ORDER — FENTANYL CITRATE (PF) 100 MCG/2ML IJ SOLN
INTRAMUSCULAR | Status: AC
  Filled 2014-06-26: qty 2

## 2014-06-26 MED ORDER — POTASSIUM CHLORIDE ER 10 MEQ PO TBCR
20.0000 meq | EXTENDED_RELEASE_TABLET | Freq: Two times a day (BID) | ORAL | Status: DC
  Administered 2014-06-26 – 2014-06-29 (×6): 20 meq via ORAL
  Filled 2014-06-26 (×9): qty 2

## 2014-06-26 MED ORDER — ZOLPIDEM TARTRATE 5 MG PO TABS
5.0000 mg | ORAL_TABLET | Freq: Every evening | ORAL | Status: DC | PRN
  Administered 2014-06-26: 5 mg via ORAL
  Filled 2014-06-26: qty 1

## 2014-06-26 MED ORDER — IOHEXOL 350 MG/ML SOLN
INTRAVENOUS | Status: DC | PRN
  Administered 2014-06-26: 30 mL via INTRA_ARTERIAL

## 2014-06-26 MED ORDER — CEFAZOLIN SODIUM-DEXTROSE 2-3 GM-% IV SOLR: 2.0000 g | INTRAVENOUS | Status: DC

## 2014-06-26 MED ORDER — ONDANSETRON HCL 4 MG/2ML IJ SOLN: 4.0000 mg | Freq: Four times a day (QID) | INTRAMUSCULAR | Status: DC | PRN

## 2014-06-26 MED ORDER — VERAPAMIL HCL 2.5 MG/ML IV SOLN
INTRAVENOUS | Status: DC | PRN
  Administered 2014-06-26: 09:00:00 via INTRA_ARTERIAL

## 2014-06-26 MED ORDER — VERAPAMIL HCL 2.5 MG/ML IV SOLN
INTRAVENOUS | Status: AC
  Filled 2014-06-26: qty 2

## 2014-06-26 MED ORDER — NITROGLYCERIN 1 MG/10 ML FOR IR/CATH LAB
INTRA_ARTERIAL | Status: AC
  Filled 2014-06-26: qty 10

## 2014-06-26 MED ORDER — HYDRALAZINE HCL 25 MG PO TABS
12.5000 mg | ORAL_TABLET | Freq: Once | ORAL | Status: AC
  Administered 2014-06-26: 12.5 mg via ORAL
  Filled 2014-06-26: qty 0.5

## 2014-06-26 MED ORDER — FUROSEMIDE 10 MG/ML IJ SOLN: 10.0000 mg/h | INTRAMUSCULAR | Status: DC

## 2014-06-26 MED ORDER — ISOSORBIDE MONONITRATE ER 60 MG PO TB24: 60.0000 mg | ORAL_TABLET | Freq: Every day | ORAL | Status: DC

## 2014-06-26 MED ORDER — OXYCODONE-ACETAMINOPHEN 10-325 MG PO TABS: 1.0000 | ORAL_TABLET | ORAL | Status: DC | PRN

## 2014-06-26 MED ORDER — SILDENAFIL CITRATE 20 MG PO TABS
20.0000 mg | ORAL_TABLET | Freq: Three times a day (TID) | ORAL | Status: DC
  Administered 2014-06-26 (×2): 20 mg via ORAL
  Filled 2014-06-26 (×5): qty 1

## 2014-06-26 MED ORDER — METOPROLOL SUCCINATE ER 25 MG PO TB24
25.0000 mg | ORAL_TABLET | Freq: Two times a day (BID) | ORAL | Status: DC
  Administered 2014-06-26 – 2014-06-29 (×7): 25 mg via ORAL
  Filled 2014-06-26 (×8): qty 1

## 2014-06-26 SURGICAL SUPPLY — 19 items
CATH BALLN WEDGE 5F 110CM (CATHETERS) ×3 IMPLANT
CATH INFINITI 5 FR JL3.5 (CATHETERS) ×3 IMPLANT
CATH INFINITI 5FR ANG PIGTAIL (CATHETERS) ×3 IMPLANT
CATH INFINITI JR4 5F (CATHETERS) ×3 IMPLANT
CATH SWAN GANZ 7F STRAIGHT (CATHETERS) ×3 IMPLANT
DEVICE RAD COMP TR BAND LRG (VASCULAR PRODUCTS) ×3 IMPLANT
GLIDESHEATH SLEND SS 6F .021 (SHEATH) ×3 IMPLANT
GUIDEWIRE .025 260CM (WIRE) ×3 IMPLANT
KIT HEART LEFT (KITS) ×3 IMPLANT
KIT HEART RIGHT NAMIC (KITS) ×3 IMPLANT
PACK CARDIAC CATHETERIZATION (CUSTOM PROCEDURE TRAY) ×3 IMPLANT
SHEATH FAST CATH BRACH 5F 5CM (SHEATH) ×3 IMPLANT
SHEATH PINNACLE 7F 10CM (SHEATH) ×3 IMPLANT
SYR MEDRAD MARK V 150ML (SYRINGE) ×3 IMPLANT
TRANSDUCER W/STOPCOCK (MISCELLANEOUS) ×3 IMPLANT
TUBING CIL FLEX 10 FLL-RA (TUBING) ×3 IMPLANT
WIRE EMERALD 3MM-J .025X260CM (WIRE) ×3 IMPLANT
WIRE HI TORQ VERSACORE-J 145CM (WIRE) ×3 IMPLANT
WIRE SAFE-T 1.5MM-J .035X260CM (WIRE) ×6 IMPLANT

## 2014-06-26 NOTE — Care Management Note (Signed)
Case Management Note  Patient Details  Name: Larry [name] MRN: [id number] Date of Birth: October 03, 1950  Subjective/Objective:     Adm w heart failure              Action/Plan: lives w wife, pcp dr Lennette Bihari [place]   Expected Discharge Date:                  Expected Discharge Plan:  [place]  In-House Referral:     Discharge planning Services  CM Consult  Post Acute Care Choice:  Resumption of Svcs/PTA Provider Choice offered to:     DME Arranged:  IV pump/equipment DME Agency:  [place]:  RN (social work) [place]:  [place]  Status of Service:     Medicare Important Message Given:    Date Medicare IM Given:    Medicare IM give by:    Date Additional Medicare IM Given:    Additional Medicare Important Message give by:     If discussed at [place] of Stay Meetings, dates discussed:    Additional Comments:ur review done, alerted donna w adv homecare of pt's adm. Pt get home milrinone and sw .  Lacretia Leigh, RN 06/26/2014, 1:35 PM

## 2014-06-26 NOTE — H&P (View-Only) (Signed)
Patient ID: Larry [name], male   DOB: 1950/04/04, 64 y.o.   MRN: [id number] PCP: Dr. Rex Kras Endocrinologist: Dr Buddy Duty  64 yo with history of chronic systolic CHF (nonischemic cardiomyopathy), CKD, prior CVA, and paroxysmal atrial fibrillation.  Patient says that he has been known to have a low EF for years.  He has a Research officer, political party ICD.  He had LHC in 2009 with nonobstructive disease. Last echo in 12/15 showed EF 20-25% with normal RV size and systolic function.     Patient had Ricardo 8/15 showing low cardiac output, mildly elevated PCWP and primarily pulmonary venous hypertension.  He was transitioned from Lasix to torsemide and started on digoxin.  Entresto was started and valsartan stopped.   Adenosine Cardiolite was done in 12/15 showing large area of apical scar but no ischemia.  This raised concern for possible ischemic cardiomyopathy.  He was admitted later in 12/15 with acute on chronic systolic CHF.  He also was noted to have gone into atrial fibrillation during this hospitalization, amiodarone was started in preparation for possible DCCV.  Troponin was mildly elevated in the hospital in the absence of chest pain, likely demand ischemia.  He was diuresed with IV Lasix and was discharged soon after.  He was cardioverted back to NSR on 02/14/14.  He was cardioverted again to NSR in 3/16.  He had RHC done 05/08/14 with mixed pulmonary hypertension and PWCP 25, CI 1.75.  CPX done 4/16 showed severe functional limitation.  In 4/16, he was started on home milrinone via PICC at 0.25 mcg/kg/min.  Co-ox increased from 55% => 72%.  Entresto stopped with elevated creatinine.   He returns for followup today.  He remains in NSR.  He remains on milrinone gtt.  He has been more short of breath over the last couple of weeks.  He is tired/dyspneic after walking 100-200 feet. Short of breath with steps. He is on a lower torsemide dose than in the past.  Weight is up 7 lbs.  No chest pain.  Stable low back pain, still difficult  to lie flat.  He sleeps on his side.  He has significant anxiety and has been seeing a psychologist Haematologist).   ECG: NSR, inferior/anterior/anterolateral Qs  Labs (2/15): LDL 103 Labs (3/15): K 4.1, creatinine 1.5, BNP 164 Labs (4/15): K 3.5, creatinine 3.3 => 1.9 => 2.1, BNP 96 Labs (4/15): K 4.3, creatinine 1.43, glucose 308 Labs (5/15): K 5.1 Creatinine 1.35  Labs (8/15): K 5 => 4.7 => 3.8, creatinine 1.19 => 1.57 => 1.6, digoxin 1.1 Labs (9/15): K 4.6, creatinine 1.32 => 1.9 => 1.1, digoxin 0.6 => 1.2 (not trough) Labs (10/15): K 4.5, creatinine 1.36, pro-BNP 1261 Labs (12/15): TSH 8.6 (elevated), free T3/free T4 normal, K 4.5, creatinine 1.71, digoxin 0.6, HCT 44.1, AST 38, ALT 44, TSH normal Labs (1/16): K 5.1, creatinine 1.96, LFTs normal, BNP 952 Labs (3/16): K 4.6, creatinine 2.06, LFTs normal, digoxin 1.8 (decreased digoxin to 0.0625) Labs (4/16): K 3.5, creatinine 1.83, LDL 37, HDL 28, HIV negative, lupus anticoagulant +.  Labs (5/16): K 4.6, creatinine 1.9  PMH: 1. Chronic systolic CHF: ? Nonischemic cardiomyopathy.  Most recent echo in 3/15 with EF 25-30%, moderate LV dilation, mild LVH, moderate diastolic dysfunction, IVC normal, mildly decreased RV systolic function.  Prior echo in 2011 also with EF 25-30%.  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  St Jude ICD. CPX 05/2013: FVC 3.89 (82%), FEV1 2.99 (83%), FEV1/FVC 77%, Peak HR 118 (74% predicted max),  Peak VO2 12.4 (51% of predicted; when corrected to IBW =17.7), VE/VCO2 29.9, OUES 1.39, Peak RER 1.21.  RHC (8/15) with mean RA 6, PA 60/21 mean 36, mean PCWP 21, CI 1.81, PVR 3.6 WU.  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  Echo (12/15) with EF 20-25%, inferior akinesis, normal RV size and systolic function.  RHC (3/16) with mean RA 7, PA 76/24 mean 43, mean PCWP 25, CI 1.75, PVR 4.62. CPX (4/16) with RER 0.99, peak VO2 7.1,  slope 36 => severely decreased functional capacity though submaximal. Home milrinone at 0.25 mcg/kg/min begun 4/16.  2. Type II diabetes 3. CKD 4. Monoclonal gammopathy of uncertain etiology.  5. CVA 2007 6. Degenerative disc disease: Chronic low back pain.   7. Pituitary microadenoma: Nonfunctioning, s/p surgery in 2009.  8. OSA: 4/16 sleep study with severe OSA, needs Bipap 9. Depression 10. Hypothyroidism 11. Hyperlipidemia 12. Atrial fibrillation:   Patient is on Xarelto. DCCV 02/14/14 to NSR.  13. 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  Saw Dr Lindi Adie with hematology.   SH: Married, former cigar smoker, unemployed.   FH: Father with MI x 2 and CHF, strong history of CAD on his father's side.   ROS: All systems reviewed and negative except as per HPI.   Current Outpatient Prescriptions  Medication Sig Dispense Refill  . ALPRAZolam (XANAX) 0.5 MG tablet Take 0.5 mg by mouth 3 (three) times daily as needed for anxiety or sleep.    [name] amiodarone (PACERONE) 200 MG tablet Take 1 tablet (200 mg total) by mouth daily. 30 tablet 3  . atorvastatin (LIPITOR) 40 MG tablet TAKE 1 TABLET DAILY (Patient taking differently: TAKE 1 TABLET BY MOUTH DAILY) 90 tablet 3  . digoxin (LANOXIN) 0.125 MG tablet Take 0.5 tablets (0.0625 mg total) by mouth daily. 90 tablet 3  . DULoxetine (CYMBALTA) 30 MG capsule Take 60 mg by mouth 2 (two) times daily.     [name] GLUCAGON EMERGENCY 1 MG injection Inject 1 mg as directed once. For hypotension    . hydrALAZINE (APRESOLINE) 25 MG tablet Take 1.5 tablets (37.5 mg total) by mouth 3 (three) times daily. 135 tablet 3  . insulin aspart (NOVOLOG) 100 UNIT/ML injection Inject 30 Units into the skin 3 (three) times daily before meals.     . isosorbide mononitrate (IMDUR) 60 MG 24 hr tablet Take 1 tablet (60 mg total) by mouth daily. 30 tablet 3  . ketorolac (ACULAR) 0.5 % ophthalmic solution Place 1 drop into the left eye 2 (two) times  daily. Start 1 week prior to surgery  4  . LANTUS SOLOSTAR 100 UNIT/ML Solostar Pen Inject 32 Units into the skin at bedtime.     [name] levothyroxine (SYNTHROID, LEVOTHROID) 25 MCG tablet Take 50 mcg by mouth daily before breakfast.   5  . Melatonin 10 MG TABS Take 10 mg by mouth at bedtime.    . metoprolol succinate (TOPROL-XL) 25 MG 24 hr tablet Take 1 tablet (25 mg total) by mouth 2 (two) times daily before a meal. 60 tablet 6  . milrinone (PRIMACOR) 20 MG/100ML SOLN infusion Inject 27.05 mcg/min into the vein continuous. 100 mL 6  . Multiple Vitamin (MULITIVITAMIN WITH MINERALS) TABS Take 1 tablet by mouth daily.    [name] ofloxacin (OCUFLOX) 0.3 % ophthalmic solution Place 1 drop into the right eye 2 (two) times daily. Start 72 hours prior  to surgery  1  . oxyCODONE-acetaminophen (PERCOCET) 10-325 MG per tablet Take 1 tablet by mouth every 4 (four) hours as needed for pain. For pain    . potassium chloride 20 MEQ TBCR Take 20 mEq by mouth daily. 20 meq daily and an extra 20 meq if extra torsemide given. 45 tablet 6  . rivaroxaban (XARELTO) 15 MG TABS tablet Take 1 tablet (15 mg total) by mouth daily with supper. 30 tablet 3  . senna (SENOKOT) 8.6 MG tablet Take 1 tablet by mouth at bedtime.     . torsemide (DEMADEX) 20 MG tablet Take 3 tablets (60 mg total) by mouth daily. 270 tablet 3  . zolpidem (AMBIEN) 5 MG tablet Take 1 tablet (5 mg total) by mouth at bedtime as needed for sleep. 30 tablet 0   No current facility-administered medications for this encounter.   Facility-Administered Medications Ordered in Other Encounters  Medication Dose Route Frequency Provider Last Rate Last Dose  . [START ON 06/20/2014] alteplase (ACTIVASE) injection 2 mg  2 mg Intracatheter Once [name], MD        Filed Vitals:   06/19/14 0927  BP: 131/69  Pulse: 67  Resp: 18  Weight: 245 lb 4 oz (111.245 kg)  SpO2: 91%   General: NAD Wife present  Neck: Thick, JVP 9-10 cm. No thyromegaly or thyroid nodule.   Lungs: Clear to auscultation bilaterally with normal respiratory effort. CV: Lateral PMI.  Heart regular S1/S2, no S3/S4, no murmur.  1+ ankle edema.  No carotid bruit.  Normal pedal pulses.  Abdomen: Obese, Soft, tender to palpation, obese, no hepatosplenomegaly, non-distended Skin: Intact without lesions or rashes.  Neurologic: Alert and oriented x 3.  Psych: Flat affect. Extremities: No clubbing or cyanosis.   Assessment/plan: 1. Chronic systolic CHF: Cardiomyopathy x years.  Thought to be nonischemic but Cardiolite in 12/15 showed apical scar (no ischemia).  EF 20-25% with normal RV size and systolic function on 61/60 echo. Has St Jude ICD, narrow QRS at baseline, more recently nonspecific IVCD so has not been a good CRT candidate.  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  PCWP was elevated but not markedly so.  RA pressure was normal, suggesting preserved RV function.  CPX in 4/16 showed severe functional impairment but was submaximal.  He has started on milrinone gtt in 4/16 with some improvement in symptoms and creatinine.  Currently NYHA class III symptoms => he appears more volume overloaded on exam today (he is on a lower dose of torsemide than in the past). - We spent additional time discussing LVAD today.  He is not a transplant candidate at this point (weight, pulmonary hypertension). He also has significant barriers to LVAD (elevated creatinine, severe anxiety, severe low back pain, pulmonary hypertension).  He has been on milrinone for about a month now.  I will bring him in next week for repeat RHC to see if PA pressure has improved.  Revatio would be an option if PA pressure remains high.  His RV function appears preserved by echo and by last RHC assessment.  I will also do coronary angiography along with RHC to rule  out significant new coronary disease prior to LVAD (last cath in 2009, he had possible evidence for scar on Cardiolite).  We discussed risks/benefits of cath => he understands these (especially risk  of renal function worsening) and is willing to go forward with the study.  He will hold Xarelto 2 days prior to cath.  - Increase torsemide to 60 mg daily. BMET/BNP today and repeat BMET in 10 days.    - Continue current digoxin and Toprol XL - Increase hydralazine to 37.5 mg tid, increase Imdur to 60 daily.  - Off Entresto with elevated creatinine.   2. CKD: BMET today. Now off Entresto.  Renal function now < 2 on milrinone but still high enough that it is concerning in terms of LVAD placement.   3. Atrial fibrillation: He is now out of atrial fibrillation.  He is on Xarelto and amiodarone 200 mg bid.  Can decrease amiodarone to 200 mg daily. Check LFTs today.  TSH high but followed by endocrinology for known hypothyroidism.  There is a good chance that he will go back into atrial fibrillation on milrinone, but I do not think that maintenance of NSR over the long-term is going to do enough to avoid advanced therapies.  4. Obesity:  Needs to lose weight.  Asked him to cut back on his portions and try low carb diet.   5. DM2: Continue to follow with endocrinology.   6. Depression/anxiety:  Doing better now that he is seeing a psychologist.  On Xanax and Cymbalta.   7. Fatigue: In addition to CHF, I think that OSA (that is untreated as he has had a hard time with CPAP) and depression play a large role. Severe OSA on sleep study, he needs to be set up with Bipap.     8. CAD: Though to have nonischemic CMP, but Cardiolite showed large apical scar.  No chest pain.  Continue statin.  Not on ASA given Xarelto use.  Plan coronary angiography prior to LVAD.    Loralie Champagne  06/19/2014

## 2014-06-26 NOTE — Progress Notes (Signed)
ANTICOAGULATION CONSULT NOTE - Initial Consult  Pharmacy Consult for xarelto Indication: atrial fibrillation  Allergies  Allergen Reactions  . Bydureon [Exenatide] Other (See Comments)    sweating  . Losartan Potassium Other (See Comments)    insomnia    Patient Measurements:     Vital Signs: Temp: 97.5 F (36.4 C) (05/18 0700) Temp Source: Oral (05/18 0700) BP: 147/124 mmHg (05/18 1110) Pulse Rate: 69 (05/18 1110)  Labs:  Recent Labs  06/26/14 0733 06/26/14 0748  HGB 10.9*  --   HCT 34.0*  --   PLT 286  --   CREATININE  --  2.17*    Estimated Creatinine Clearance: 44.2 mL/min (by C-G formula based on Cr of 2.17).   Medical History: Past Medical History  Diagnosis Date  . Cardiomyopathy     nonischemic (EF 25%)  . CAD (coronary artery disease)     nonobstructive CAD by cath 2009  . Obesity   . CVA (cerebral vascular accident)     CVA 2007 without residual deficit  . Pituitary tumor      (nonfunctionging pituitary microadenoma) s/p gamma knife surgery at [facility] 2009 with neuropathy and retinopathy  . Depression   . Erectile dysfunction   . DDD (degenerative disc disease)   . OSA (obstructive sleep apnea)   . Monoclonal gammopathy   . CRI (chronic renal insufficiency)     CRI (baseline creatinine 1.6)  . CKD (chronic kidney disease), stage III     moderate  . 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 Hypogonadotropic hypogonadism   . Polycythemia, secondary     improved/resolved  . Hypercholesterolemia   . Back pain     F/B [name]  . Monoclonal gammopathy of unknown significance     per [name]  . 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 Neck pain     F/B [name]  . PAF (paroxysmal atrial fibrillation) 06/04/10    Hx of, has device  . History of diverticulitis of colon   . Type II or unspecified type diabetes mellitus with neurological manifestations, uncontrolled 2000  . Nonproliferative  diabetic retinopathy NOS(362.03)   . Ventricular tachycardia 10/25/13    appropriate ICD shock, VT CL 230-240 msec  . Confusion 02/01/2014  . Hypertension   . Anginal pain   . Dysrhythmia   . AICD (automatic cardioverter/defibrillator) present   . Presence of permanent cardiac pacemaker   . Anxiety   . Shortness of breath dyspnea   . Peripheral vascular disease     2007   Assessment: 64 yo with history of chronic systolic CHF (nonischemic cardiomyopathy), CKD, prior CVA, and paroxysmal atrial fibrillation. Patient says that he has been known to have a low EF for years. He has a Research officer, political party ICD. He had LHC in 2009 with nonobstructive disease. Last echo in 12/15 showed EF 20-25% with normal RV size and systolic function.   Patient was admitted post heart cath to CCU. Patient is on reduced dose xarelto for his PAF. Orders to restart his xarelto 8 hours post sheath pull (~1000). No bleeding/hematoma noted post cath. Baseline hgb 10.9. CKD noted with scr 2.17; est crcl ~55 - will discuss increasing dose with cardiology.  This patients CHA2DS2-VASc Score and unadjusted Ischemic Stroke Rate (% per year) is equal to 7.2 % stroke rate/year from a score of 5  Above score calculated as 1 point each if present [CHF, HTN, DM, Vascular=MI/PAD/Aortic Plaque,  Age if 18-74, or Male] Above score calculated as 2 points each if present [Age > 75, or Stroke/TIA/TE]  Goal of Therapy:  Monitor platelets by anticoagulation protocol: Yes   Plan:  Resume Xarelto 15mg  with supper daily CBC in am  Erin Hearing PharmD., BCPS Clinical Pharmacist Pager [phone] 06/26/2014 2:09 PM

## 2014-06-26 NOTE — Interval H&P Note (Signed)
History and Physical Interval Note:  06/26/2014 8:38 AM  Larry [name]  has presented today for surgery, with the diagnosis of hf  The various methods of treatment have been discussed with the patient and family. After consideration of risks, benefits and other options for treatment, the patient has consented to  Procedure(s): Right/Left Heart Cath And Coronary Angiography (N/A) as a surgical intervention .  The patient's history has been reviewed, patient examined, no change in status, stable for surgery.  I have reviewed the patient's chart and labs.  Questions were answered to the patient's satisfaction.     Bonham Zingale [employer]

## 2014-06-26 NOTE — Progress Notes (Signed)
TR BAND REMOVAL  LOCATION:    right radial   DEFLATED PER PROTOCOL:    Yes.    TIME BAND OFF / DRESSING APPLIED:    1300   SITE UPON ARRIVAL:    Level 0  SITE AFTER BAND REMOVAL:    Level 0  REVERSE ALLEN'S TEST:     positive  CIRCULATION SENSATION AND MOVEMENT:    Within Normal Limits   Yes.    COMMENTS:   VS remain stable No problem at site.

## 2014-06-26 NOTE — Progress Notes (Signed)
Patient stated Picc Line dressing changed today 06-26-2014. Documented next change date in flowsheet for 07/03/2014. Will continue to monitor closely.

## 2014-06-26 NOTE — Telephone Encounter (Signed)
lmtcb X3

## 2014-06-26 NOTE — Progress Notes (Signed)
Site area:right groin a 7 french venous sheath was removed  Site Prior to Removal:  Level 0  Pressure Applied For 15 MINUTES    Minutes Beginning at 1040a  Manual:   Yes.    Patient Status During Pull:  stable  Post Pull Groin Site:  Level 0  Post Pull Instructions Given:  Yes.    Post Pull Pulses Present:  Yes.    Dressing Applied:  Yes.    Comments:  VS remain stable during sheath pull.  Pt denies any discomfort at site at this time

## 2014-06-26 NOTE — Progress Notes (Signed)
Site area: Left brachial 5 french venous sheath was removed  Site Prior to Removal:  Level 0  Pressure Applied For 10 MINUTES    Minutes Beginning at 1020a  Manual:   Yes.    Patient Status During Pull:  stable  Post Pull Groin Site:  Level 0  Post Pull Instructions Given:  Yes.    Post Pull Pulses Present:  Yes.    Dressing Applied:  Yes.    Comments:  VS remain stable during sheath pull.  Pt denies any discomfort at site at this time

## 2014-06-27 ENCOUNTER — Telehealth: Payer: Self-pay | Admitting: Pulmonary Disease

## 2014-06-27 ENCOUNTER — Inpatient Hospital Stay (HOSPITAL_COMMUNITY): Admission: RE | Admit: 2014-06-27 | Payer: 59 | Source: Ambulatory Visit | Admitting: Dentistry

## 2014-06-27 ENCOUNTER — Encounter (HOSPITAL_COMMUNITY): Admission: RE | Payer: Self-pay | Source: Ambulatory Visit

## 2014-06-27 DIAGNOSIS — N183 Chronic kidney disease, stage 3 (moderate): Secondary | ICD-10-CM

## 2014-06-27 DIAGNOSIS — I270 Primary pulmonary hypertension: Secondary | ICD-10-CM

## 2014-06-27 DIAGNOSIS — I5023 Acute on chronic systolic (congestive) heart failure: Principal | ICD-10-CM

## 2014-06-27 LAB — CBC WITH DIFFERENTIAL/PLATELET
BASOS PCT: 1 % (ref 0–1)
Basophils Absolute: 0.1 10*3/uL (ref 0.0–0.1)
EOS ABS: 0.4 10*3/uL (ref 0.0–0.7)
EOS PCT: 6 % — AB (ref 0–5)
HEMATOCRIT: 31.9 % — AB (ref 39.0–52.0)
Hemoglobin: 10.1 g/dL — ABNORMAL LOW (ref 13.0–17.0)
LYMPHS ABS: 0.8 10*3/uL (ref 0.7–4.0)
Lymphocytes Relative: 10 % — ABNORMAL LOW (ref 12–46)
MCH: 32.2 pg (ref 26.0–34.0)
MCHC: 31.7 g/dL (ref 30.0–36.0)
MCV: 101.6 fL — AB (ref 78.0–100.0)
MONO ABS: 0.8 10*3/uL (ref 0.1–1.0)
MONOS PCT: 10 % (ref 3–12)
Neutro Abs: 5.7 10*3/uL (ref 1.7–7.7)
Neutrophils Relative %: 73 % (ref 43–77)
Platelets: 254 10*3/uL (ref 150–400)
RBC: 3.14 MIL/uL — ABNORMAL LOW (ref 4.22–5.81)
RDW: 16.4 % — ABNORMAL HIGH (ref 11.5–15.5)
WBC: 7.8 10*3/uL (ref 4.0–10.5)

## 2014-06-27 LAB — BASIC METABOLIC PANEL
Anion gap: 10 (ref 5–15)
BUN: 26 mg/dL — ABNORMAL HIGH (ref 6–20)
CALCIUM: 7.7 mg/dL — AB (ref 8.9–10.3)
CO2: 24 mmol/L (ref 22–32)
Chloride: 93 mmol/L — ABNORMAL LOW (ref 101–111)
Creatinine, Ser: 1.61 mg/dL — ABNORMAL HIGH (ref 0.61–1.24)
GFR calc non Af Amer: 45 mL/min — ABNORMAL LOW (ref >60)
GFR, EST AFRICAN AMERICAN: 52 mL/min — AB (ref >60)
Glucose, Bld: 404 mg/dL — ABNORMAL HIGH (ref 65–99)
POTASSIUM: 3.5 mmol/L (ref 3.5–5.1)
Sodium: 127 mmol/L — ABNORMAL LOW (ref 135–145)

## 2014-06-27 LAB — GLUCOSE, RANDOM: Glucose, Bld: 209 mg/dL — ABNORMAL HIGH (ref 65–99)

## 2014-06-27 LAB — GLUCOSE, CAPILLARY
GLUCOSE-CAPILLARY: 155 mg/dL — AB (ref 65–99)
Glucose-Capillary: 190 mg/dL — ABNORMAL HIGH (ref 65–99)
Glucose-Capillary: 182 mg/dL — ABNORMAL HIGH (ref 65–99)
Glucose-Capillary: 169 mg/dL — ABNORMAL HIGH (ref 65–99)

## 2014-06-27 LAB — CARBOXYHEMOGLOBIN
CARBOXYHEMOGLOBIN: 1.8 % — AB (ref 0.5–1.5)
METHEMOGLOBIN: 1 % (ref 0.0–1.5)
O2 Saturation: 59.4 %
TOTAL HEMOGLOBIN: 10.4 g/dL — AB (ref 13.5–18.0)

## 2014-06-27 LAB — HEMOGLOBIN A1C
HEMOGLOBIN A1C: 7.7 % — AB (ref 4.8–5.6)
Mean Plasma Glucose: 174 mg/dL

## 2014-06-27 SURGERY — RIGHT/LEFT HEART CATH AND CORONARY ANGIOGRAPHY

## 2014-06-27 SURGERY — DENTAL RESTORATION/EXTRACTIONS
Anesthesia: General

## 2014-06-27 MED ORDER — RIVAROXABAN 20 MG PO TABS
20.0000 mg | ORAL_TABLET | Freq: Every day | ORAL | Status: DC
  Administered 2014-06-27 – 2014-06-29 (×3): 20 mg via ORAL
  Filled 2014-06-27 (×3): qty 1

## 2014-06-27 MED ORDER — SILDENAFIL CITRATE 20 MG PO TABS
40.0000 mg | ORAL_TABLET | Freq: Three times a day (TID) | ORAL | Status: DC
  Administered 2014-06-27 – 2014-06-29 (×8): 40 mg via ORAL
  Filled 2014-06-27 (×9): qty 2

## 2014-06-27 MED ORDER — POTASSIUM CHLORIDE CRYS ER 20 MEQ PO TBCR
40.0000 meq | EXTENDED_RELEASE_TABLET | Freq: Once | ORAL | Status: AC
  Administered 2014-06-27: 40 meq via ORAL

## 2014-06-27 MED FILL — Lidocaine HCl Local Preservative Free (PF) Inj 1%: INTRAMUSCULAR | Qty: 60 | Status: AC

## 2014-06-27 MED FILL — Heparin Sodium (Porcine) 2 Unit/ML in Sodium Chloride 0.9%: INTRAMUSCULAR | Qty: 1000 | Status: AC

## 2014-06-27 NOTE — Telephone Encounter (Signed)
lmtcb for pt's wife on both numbers. Will sign off per triage protocol.

## 2014-06-27 NOTE — Progress Notes (Signed)
Patient ID: Larry [name], male   DOB: [birth year], 64 y.o.   MRN: [id number]   SUBJECTIVE: Feeling better this morning.  Co-ox 59%, CVP still high around 12 this morning.  He diuresed well yesterday, weight down 3 lbs.  Unable to measure UOP accurately yesterday, doing better on this today.  Creatinine lower today.   RHC/LHC:  Coronary angiography: Nonobstructive CAD RHC Hemodynamics (mmHg) RA mean 17 RV 105/18 PA 102/37, mean 60 PCWP mean 37 LV 134/32 AO 122/72 Oxygen saturations: PA 48% AO 94% Cardiac Output (Fick) 4.41 Cardiac Index (Fick) 1.95 PVR 5.2 WU   Scheduled Meds: . amiodarone  200 mg Oral Daily  . atorvastatin  40 mg Oral Daily  . digoxin  0.0625 mg Oral Daily  . DULoxetine  60 mg Oral BID  . hydrALAZINE  50 mg Oral TID  . insulin aspart  0-15 Units Subcutaneous TID WC  . insulin aspart  30 Units Subcutaneous TID AC  . insulin glargine  32 Units Subcutaneous QHS  . ketorolac  1 drop Left Eye BID  . levothyroxine  50 mcg Oral QAC breakfast  . metoprolol succinate  25 mg Oral BID AC  . multivitamin with minerals  1 tablet Oral Daily  . ofloxacin  1 drop Right Eye BID  . potassium chloride  20 mEq Oral BID  . potassium chloride  40 mEq Oral Once  . rivaroxaban  15 mg Oral Q supper  . senna  1 tablet Oral QHS  . sildenafil  40 mg Oral TID   Continuous Infusions: . furosemide (LASIX) infusion 10 mg/hr (06/26/14 1340)  . milrinone 0.375 mcg/kg/min (06/27/14 0500)   PRN Meds:.acetaminophen, ALPRAZolam, ondansetron (ZOFRAN) IV, oxyCODONE-acetaminophen **AND** oxyCODONE, zolpidem    Filed Vitals:   06/27/14 0028 06/27/14 0344 06/27/14 0400 06/27/14 0500  BP:  123/46    Pulse: 72 70 70   Temp:  97.3 F (36.3 C)    TempSrc:  Oral    Resp: 24 20 19    Height:      Weight:    238 lb 15.7 oz (108.4 kg)  SpO2: 95% 93% 92%     Intake/Output Summary (Last 24 hours) at 06/27/14 0750 Last data filed at 06/27/14 0700  Gross per 24 hour  Intake 698.83 ml    Output   1151 ml  Net -452.17 ml    LABS: Basic Metabolic Panel:  Recent Labs  06/26/14 0748 06/27/14 0410 06/27/14 0546  NA 134* 127*  --   K 3.9 3.5  --   CL 97* 93*  --   CO2 26 24  --   GLUCOSE 240* 404* 209*  BUN 33* 26*  --   CREATININE 2.17* 1.61*  --   CALCIUM 9.0 7.7*  --    Liver Function Tests: No results for input(s): AST, ALT, ALKPHOS, BILITOT, PROT, ALBUMIN in the last 72 hours. No results for input(s): LIPASE, AMYLASE in the last 72 hours. CBC:  Recent Labs  06/26/14 0733 06/27/14 0410  WBC 7.1 7.8  NEUTROABS  --  5.7  HGB 10.9* 10.1*  HCT 34.0* 31.9*  MCV 100.9* 101.6*  PLT 286 254   Cardiac Enzymes: No results for input(s): CKTOTAL, CKMB, CKMBINDEX, TROPONINI in the last 72 hours. BNP: Invalid input(s): POCBNP D-Dimer: No results for input(s): DDIMER in the last 72 hours. Hemoglobin A1C: No results for input(s): HGBA1C in the last 72 hours. Fasting Lipid Panel: No results for input(s): CHOL, HDL, LDLCALC, TRIG, CHOLHDL, LDLDIRECT in the  last 72 hours. Thyroid Function Tests: No results for input(s): TSH, T4TOTAL, T3FREE, THYROIDAB in the last 72 hours.  Invalid input(s): FREET3 Anemia Panel: No results for input(s): VITAMINB12, FOLATE, FERRITIN, TIBC, IRON, RETICCTPCT in the last 72 hours.  RADIOLOGY: No results found.  PHYSICAL EXAM General: NAD Neck: JVP 10-12 cm, no thyromegaly or thyroid nodule.  Lungs: Clear to auscultation bilaterally with normal respiratory effort. CV: Nondisplaced PMI.  Heart regular S1/S2, no S3/S4, no murmur.  Trace ankle edema.   Abdomen: Soft, nontender, no hepatosplenomegaly, no distention.  Neurologic: Alert and oriented x 3.  Psych: Normal affect. Extremities: No clubbing or cyanosis.   TELEMETRY: Reviewed telemetry pt in NSR  ASSESSMENT AND PLAN: 64 yo with history of nonischemic cardiomyopathy/chronic systolic CHF on home milrinone, CKD, diabetes, paroxysmal atrial fibrillation was admitted on  5/18 after RHC/LHC with high filling pressures and pulmonary hypertension and persistently low cardiac index.  1. Acute on chronic systolic CHF: EF 56-38% on last echo.  Nonischemic cardiomyopathy confirmed by coronary angiography this admission.  Milrinone increased to 0.375 with co-ox 59% this morning.  CVP 17 but diuresing well and weight down 3 lbs.  Creatinine better today likely with decongestion.  - Continue current milrinone, co-ox in am.  - Continue Lasix gtt at 10 mg/hr - Continue hydralazine, digoxin, Toprol XL.  Will check digoxin level in am.  - Can add spironolactone 12.5 daily.  2. CKD: 25-30 cc contrast only with angiography yesterday.  Creatinine better today with decongestion and increased milrinone.  Continue to follow closely.  3. CAD: Nonobstructive.  Continue statin.  4. Atrial fibrillation: Paroxysmal.  He is in NSR, continue amiodarone.  5. 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  Now on Revatio, tolerated well.  Increase to 40 mg tid.  6. Diabetes: Will consult diabetes coordinator.  Check hemoglobin A1c.  7. Ambulate.   Loralie Champagne 06/27/2014 7:58 AM

## 2014-06-27 NOTE — Progress Notes (Signed)
Noted lab work today for glucose reading 404. Checked chem stick and read 190. Will advise day shift of difference and monitor patient closely.

## 2014-06-27 NOTE — Progress Notes (Signed)
Inpatient Diabetes Program Recommendations  AACE/ADA: New Consensus Statement on Inpatient Glycemic Control (2013)  Target Ranges:  Prepandial:   less than 140 mg/dL      Peak postprandial:   less than 180 mg/dL (1-2 hours)      Critically ill patients:  140 - 180 mg/dL   This coordinator met with patient to encourage good glycemic management at home.  Patient's A1C has improved in 1 month from 10.1 down to 7.7 and pt states he has been eating more "natural food" and is happy to hear that he has improved his A1C in such a short time.  He states that his insulin regimen has remained the same thus far.  No questions/concerns at the end of our visit. Thank you  Raoul Pitch BSN, RN,CDE Inpatient Diabetes Coordinator [phone] (team pager)

## 2014-06-27 NOTE — Telephone Encounter (Signed)
Spoke with patient's wife, advised of Dr. Juanetta Gosling recommendations.  Patient's wife says that they have an appointment with Dr. Halford Chessman on 07/03/2014 to discuss sleep study results.  She said that they will keep that appointment to go over what needs to be done.  Nothing further needed.

## 2014-06-27 NOTE — Progress Notes (Signed)
Random glucose sent to recheck level. Noted IV drips Milrinone and Lasix are mixed in D5W, They are attached to CVP line blood sample was drawn from. Monitoring closely.

## 2014-06-27 NOTE — Telephone Encounter (Signed)
Spoke with pt's wife.  Pt is in [facility] step down room 19 after having right and left heart cath with some post op fluid build up.  He was due to have complete dental extraction while he is in hospital but this was postponed due to complications. Dr Aundra Dubin thinks pt needs to be put on bipap while in hospital and especially if he has dental extraction because he will be under complete sedation.  Dr Aundra Dubin wants Dr Juanetta Gosling recommendations regarding settings.  Per Phone Note 05/30/14:    Expand All Collapse All   PSG 05/22/14 >> AHI 62.4, SaO2 low 76%.  Sup-optimal titration >> OSA/CSA with CPAP, not enough time with BiPAP.  Will have my nurse inform pt that he has severe complex sleep apnea.  Options at this time are 1) in lab BiPAP titration study now, or 2) ROV first.  If he is agreeable to titration study, then send order to do titration starting starting with BiPAP 13/9 cm H2O, titrate up as needed, and add supplemental oxygen as needed.   Please advise Dr Halford Chessman

## 2014-06-27 NOTE — Progress Notes (Addendum)
Patient stated he was not going to take scheduled dose of Lantus due to blood sugar reading of 115. He stated if he was home he would take 1/2 of that dose. Only 15 units given tonight.Dr. Aundra Dubin notified with text.  Will continue to monitor closely.

## 2014-06-27 NOTE — Progress Notes (Signed)
[facility]  Patient Status: Active pt with [facility] prior to this admission  [facility] is providing the following services: HHRN and [facility] team for home Milrinone prior to this readmission. [facility] hospital team will follow [name] while inpatient and support with home care services as ordered at [place].  If patient discharges after hours, please call [phone].   Larry [name] 06/27/2014, 8:19 AM

## 2014-06-27 NOTE — Telephone Encounter (Signed)
Sent message to Dr. Aundra Dubin about sleep study results.  Advised that pulmonary consult can be requested while Larry [name] is in hospital to assist with therapy.  Otherwise, will need BiPAP titration study scheduled once he is discharged from hospital.

## 2014-06-28 LAB — CARBOXYHEMOGLOBIN
CARBOXYHEMOGLOBIN: 1.9 % — AB (ref 0.5–1.5)
Methemoglobin: 1.2 % (ref 0.0–1.5)
O2 Saturation: 63.6 %
TOTAL HEMOGLOBIN: 11.1 g/dL — AB (ref 13.5–18.0)

## 2014-06-28 LAB — GLUCOSE, CAPILLARY
GLUCOSE-CAPILLARY: 97 mg/dL (ref 65–99)
Glucose-Capillary: 189 mg/dL — ABNORMAL HIGH (ref 65–99)
Glucose-Capillary: 158 mg/dL — ABNORMAL HIGH (ref 65–99)

## 2014-06-28 LAB — BASIC METABOLIC PANEL
Anion gap: 8 (ref 5–15)
BUN: 31 mg/dL — ABNORMAL HIGH (ref 6–20)
CO2: 24 mmol/L (ref 22–32)
Calcium: 8.9 mg/dL (ref 8.9–10.3)
Chloride: 100 mmol/L — ABNORMAL LOW (ref 101–111)
Creatinine, Ser: 1.97 mg/dL — ABNORMAL HIGH (ref 0.61–1.24)
GFR calc Af Amer: 40 mL/min — ABNORMAL LOW (ref >60)
GFR calc non Af Amer: 35 mL/min — ABNORMAL LOW (ref >60)
Glucose, Bld: 264 mg/dL — ABNORMAL HIGH (ref 65–99)
Potassium: 3.9 mmol/L (ref 3.5–5.1)
SODIUM: 132 mmol/L — AB (ref 135–145)

## 2014-06-28 LAB — CBC
HCT: 32.7 % — ABNORMAL LOW (ref 39.0–52.0)
Hemoglobin: 10.6 g/dL — ABNORMAL LOW (ref 13.0–17.0)
MCH: 32.5 pg (ref 26.0–34.0)
MCHC: 32.4 g/dL (ref 30.0–36.0)
MCV: 100.3 fL — AB (ref 78.0–100.0)
PLATELETS: 268 10*3/uL (ref 150–400)
RBC: 3.26 MIL/uL — ABNORMAL LOW (ref 4.22–5.81)
RDW: 16 % — ABNORMAL HIGH (ref 11.5–15.5)
WBC: 7 10*3/uL (ref 4.0–10.5)

## 2014-06-28 LAB — DIGOXIN LEVEL: Digoxin Level: 0.4 ng/mL — ABNORMAL LOW (ref 0.8–2.0)

## 2014-06-28 LAB — CLOSTRIDIUM DIFFICILE BY PCR: Toxigenic C. Difficile by PCR: NEGATIVE

## 2014-06-28 MED ORDER — TORSEMIDE 20 MG PO TABS
60.0000 mg | ORAL_TABLET | Freq: Every day | ORAL | Status: DC
  Administered 2014-06-28 – 2014-06-29 (×2): 60 mg via ORAL
  Filled 2014-06-28 (×2): qty 3

## 2014-06-28 MED ORDER — SPIRONOLACTONE 12.5 MG HALF TABLET
12.5000 mg | ORAL_TABLET | Freq: Every day | ORAL | Status: DC
  Administered 2014-06-28 – 2014-06-29 (×2): 12.5 mg via ORAL
  Filled 2014-06-28 (×2): qty 1

## 2014-06-28 MED ORDER — HYDRALAZINE HCL 50 MG PO TABS
75.0000 mg | ORAL_TABLET | Freq: Three times a day (TID) | ORAL | Status: DC
  Administered 2014-06-28 – 2014-06-29 (×5): 75 mg via ORAL
  Filled 2014-06-28 (×6): qty 1

## 2014-06-28 NOTE — Progress Notes (Signed)
Pt. Refused bipap. RT informed pt. To notify if he changes his mind.

## 2014-06-28 NOTE — Progress Notes (Signed)
5/20 1025a left pt 0.00 copay card for brand name revatio. No copay til end 2017 for revatio. Card left in room.

## 2014-06-28 NOTE — Progress Notes (Signed)
Patient ID: Larry [name], male   DOB: 1950-10-24, 64 y.o.   MRN: [id number]   SUBJECTIVE: Feeling better this morning.  Co-ox 64%, CVP 5 today.  I/Os very difficult to record but weight down another 5 lbs.  Creatinine 1.97.   RHC/LHC:  Coronary angiography: Nonobstructive CAD RHC Hemodynamics (mmHg) RA mean 17 RV 105/18 PA 102/37, mean 60 PCWP mean 37 LV 134/32 AO 122/72 Oxygen saturations: PA 48% AO 94% Cardiac Output (Fick) 4.41 Cardiac Index (Fick) 1.95 PVR 5.2 WU   Scheduled Meds: . amiodarone  200 mg Oral Daily  . atorvastatin  40 mg Oral Daily  . digoxin  0.0625 mg Oral Daily  . DULoxetine  60 mg Oral BID  . hydrALAZINE  75 mg Oral TID  . insulin aspart  0-15 Units Subcutaneous TID WC  . insulin aspart  30 Units Subcutaneous TID AC  . insulin glargine  32 Units Subcutaneous QHS  . ketorolac  1 drop Left Eye BID  . levothyroxine  50 mcg Oral QAC breakfast  . metoprolol succinate  25 mg Oral BID AC  . multivitamin with minerals  1 tablet Oral Daily  . ofloxacin  1 drop Right Eye BID  . potassium chloride  20 mEq Oral BID  . rivaroxaban  20 mg Oral Q supper  . senna  1 tablet Oral QHS  . sildenafil  40 mg Oral TID  . torsemide  60 mg Oral Daily   Continuous Infusions: . milrinone 0.375 mcg/kg/min (06/28/14 0506)   PRN Meds:.acetaminophen, ALPRAZolam, ondansetron (ZOFRAN) IV, oxyCODONE-acetaminophen **AND** oxyCODONE, zolpidem    Filed Vitals:   06/28/14 0440 06/28/14 0518 06/28/14 0748 06/28/14 0805  BP:   160/60 130/90  Pulse: 77 74 74 74  Temp:   97.7 F (36.5 C)   TempSrc:   Oral   Resp: 19 19 17    Height:      Weight:      SpO2: 97% 89% 96%     Intake/Output Summary (Last 24 hours) at 06/28/14 0846 Last data filed at 06/28/14 0400  Gross per 24 hour  Intake    566 ml  Output   1300 ml  Net   -734 ml    LABS: Basic Metabolic Panel:  Recent Labs  06/27/14 0410 06/27/14 0546 06/28/14 0430  NA 127*  --  132*  K 3.5  --  3.9  CL 93*   --  100*  CO2 24  --  24  GLUCOSE 404* 209* 264*  BUN 26*  --  31*  CREATININE 1.61*  --  1.97*  CALCIUM 7.7*  --  8.9   Liver Function Tests: No results for input(s): AST, ALT, ALKPHOS, BILITOT, PROT, ALBUMIN in the last 72 hours. No results for input(s): LIPASE, AMYLASE in the last 72 hours. CBC:  Recent Labs  06/27/14 0410 06/28/14 0430  WBC 7.8 7.0  NEUTROABS 5.7  --   HGB 10.1* 10.6*  HCT 31.9* 32.7*  MCV 101.6* 100.3*  PLT 254 268   Cardiac Enzymes: No results for input(s): CKTOTAL, CKMB, CKMBINDEX, TROPONINI in the last 72 hours. BNP: Invalid input(s): POCBNP D-Dimer: No results for input(s): DDIMER in the last 72 hours. Hemoglobin A1C:  Recent Labs  06/26/14 1645  HGBA1C 7.7*   Fasting Lipid Panel: No results for input(s): CHOL, HDL, LDLCALC, TRIG, CHOLHDL, LDLDIRECT in the last 72 hours. Thyroid Function Tests: No results for input(s): TSH, T4TOTAL, T3FREE, THYROIDAB in the last 72 hours.  Invalid input(s): FREET3 Anemia  Panel: No results for input(s): VITAMINB12, FOLATE, FERRITIN, TIBC, IRON, RETICCTPCT in the last 72 hours.  RADIOLOGY: No results found.  PHYSICAL EXAM General: NAD Neck: JVP 10-12 cm, no thyromegaly or thyroid nodule.  Lungs: Clear to auscultation bilaterally with normal respiratory effort. CV: Nondisplaced PMI.  Heart regular S1/S2, no S3/S4, no murmur.  Trace ankle edema.   Abdomen: Soft, nontender, no hepatosplenomegaly, no distention.  Neurologic: Alert and oriented x 3.  Psych: Normal affect. Extremities: No clubbing or cyanosis.   TELEMETRY: Reviewed telemetry pt in NSR  ASSESSMENT AND PLAN: 64 yo with history of nonischemic cardiomyopathy/chronic systolic CHF on home milrinone, CKD, diabetes, paroxysmal atrial fibrillation was admitted on 5/18 after RHC/LHC with high filling pressures and pulmonary hypertension and persistently low cardiac index.  1. Acute on chronic systolic CHF: EF 22-33% on last echo.  Nonischemic  cardiomyopathy confirmed by coronary angiography this admission.  Milrinone increased to 0.375 with co-ox 64% this morning.  CVP 5 today and weight down another 5 lbs.   - Continue current milrinone, co-ox in am.  - Stop Lasix, transition to torsemide 60 mg daily for home.  - Continue digoxin, Toprol XL.  Digoxin level ok.  - Can add spironolactone 12.5 daily.  - Increase hydralazine to 75 mg tid.  - We have been considering him for LVAD.  Elevated creatinine and very high PA pressure on RHC this admission are barriers.  2. CKD: 25-30 cc contrast only with angiography this admission.  Creatinine remains below his initial creatinine (2.1).  3. CAD: Nonobstructive.  Continue statin.  4. Atrial fibrillation: Paroxysmal.  He is in NSR, continue amiodarone.  5. 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  Now on Revatio at 40 mg tid, tolerated well.    6. Diabetes: Will consult diabetes coordinator.  Check hemoglobin A1c.  7. Ambulate.  Hopefully home tomorrow morning.  Will plan to continue him on new medication regimen and repeat RHC to look for improvement after about a month.   Loralie Champagne 06/28/2014 8:46 AM

## 2014-06-28 NOTE — Discharge Instructions (Signed)

## 2014-06-28 NOTE — Progress Notes (Signed)
Placed pt on 14/10 BIPAP per MD order. Pt wore for a few minutes and stated to take it off because it he was having surgery soon on his teeth and it was putting to much pressure on them. Attempted to adjust mask and make it looser for patient. Again pt tolerated for a few minutes before asking to take the mask off again. Pt stated he cant tolerate it and that he would rather not wear it and talk to his MD in the morning. RT made pt aware that wearing BIPAP was important and pt still refused. Wife at bedside. RT made pt aware that if he would like to try again later to call. RT will continue to monitor.

## 2014-06-29 ENCOUNTER — Encounter (HOSPITAL_COMMUNITY): Payer: Self-pay | Admitting: Physician Assistant

## 2014-06-29 LAB — CBC
HEMATOCRIT: 34 % — AB (ref 39.0–52.0)
Hemoglobin: 10.8 g/dL — ABNORMAL LOW (ref 13.0–17.0)
MCH: 31.8 pg (ref 26.0–34.0)
MCHC: 31.8 g/dL (ref 30.0–36.0)
MCV: 100 fL (ref 78.0–100.0)
Platelets: 264 10*3/uL (ref 150–400)
RBC: 3.4 MIL/uL — ABNORMAL LOW (ref 4.22–5.81)
RDW: 16 % — AB (ref 11.5–15.5)
WBC: 6.4 10*3/uL (ref 4.0–10.5)

## 2014-06-29 LAB — BASIC METABOLIC PANEL
ANION GAP: 11 (ref 5–15)
BUN: 35 mg/dL — ABNORMAL HIGH (ref 6–20)
CO2: 29 mmol/L (ref 22–32)
CREATININE: 1.8 mg/dL — AB (ref 0.61–1.24)
Calcium: 8.8 mg/dL — ABNORMAL LOW (ref 8.9–10.3)
Chloride: 97 mmol/L — ABNORMAL LOW (ref 101–111)
GFR calc Af Amer: 45 mL/min — ABNORMAL LOW (ref >60)
GFR, EST NON AFRICAN AMERICAN: 39 mL/min — AB (ref >60)
Glucose, Bld: 166 mg/dL — ABNORMAL HIGH (ref 65–99)
Potassium: 3.3 mmol/L — ABNORMAL LOW (ref 3.5–5.1)
Sodium: 137 mmol/L (ref 135–145)

## 2014-06-29 LAB — GLUCOSE, CAPILLARY
GLUCOSE-CAPILLARY: 91 mg/dL (ref 65–99)
GLUCOSE-CAPILLARY: 191 mg/dL — AB (ref 65–99)
Glucose-Capillary: 77 mg/dL (ref 65–99)
Glucose-Capillary: 185 mg/dL — ABNORMAL HIGH (ref 65–99)
Glucose-Capillary: 103 mg/dL — ABNORMAL HIGH (ref 65–99)

## 2014-06-29 LAB — CARBOXYHEMOGLOBIN
CARBOXYHEMOGLOBIN: 2.2 % — AB (ref 0.5–1.5)
Methemoglobin: 1.2 % (ref 0.0–1.5)
O2 SAT: 71.2 %
Total hemoglobin: 10.8 g/dL — ABNORMAL LOW (ref 13.5–18.0)

## 2014-06-29 MED ORDER — RIVAROXABAN 20 MG PO TABS: 20.0000 mg | ORAL_TABLET | Freq: Every day | ORAL | Status: DC

## 2014-06-29 MED ORDER — POTASSIUM CHLORIDE ER 20 MEQ PO TBCR: 20.0000 meq | EXTENDED_RELEASE_TABLET | Freq: Two times a day (BID) | ORAL | Status: DC

## 2014-06-29 MED ORDER — HYDRALAZINE HCL 50 MG PO TABS: 75.0000 mg | ORAL_TABLET | Freq: Three times a day (TID) | ORAL | Status: DC

## 2014-06-29 MED ORDER — SILDENAFIL CITRATE 20 MG PO TABS: 40.0000 mg | ORAL_TABLET | Freq: Three times a day (TID) | ORAL | Status: DC

## 2014-06-29 MED ORDER — MILRINONE IN DEXTROSE 20 MG/100ML IV SOLN: 0.3750 ug/kg/min | INTRAVENOUS | Status: DC

## 2014-06-29 MED ORDER — SPIRONOLACTONE 25 MG PO TABS: 12.5000 mg | ORAL_TABLET | Freq: Every day | ORAL | Status: DC

## 2014-06-29 NOTE — Progress Notes (Signed)
Patient ID: Larry [name], male   DOB: [birth year], 64 y.o.   MRN: [id number]   SUBJECTIVE: Feels a lot better this morning.  Co-ox 71%, CVP 3 today.  I/Os very difficult to record but weight down another pound.  Creatinine down to 1.8   RHC/LHC:  Coronary angiography: Nonobstructive CAD RHC Hemodynamics (mmHg) RA mean 17 RV 105/18 PA 102/37, mean 60 PCWP mean 37 LV 134/32 AO 122/72 Oxygen saturations: PA 48% AO 94% Cardiac Output (Fick) 4.41 Cardiac Index (Fick) 1.95 PVR 5.2 WU   Scheduled Meds: . amiodarone  200 mg Oral Daily  . atorvastatin  40 mg Oral Daily  . digoxin  0.0625 mg Oral Daily  . DULoxetine  60 mg Oral BID  . hydrALAZINE  75 mg Oral TID  . insulin aspart  0-15 Units Subcutaneous TID WC  . insulin aspart  30 Units Subcutaneous TID AC  . insulin glargine  32 Units Subcutaneous QHS  . ketorolac  1 drop Left Eye BID  . levothyroxine  50 mcg Oral QAC breakfast  . metoprolol succinate  25 mg Oral BID AC  . multivitamin with minerals  1 tablet Oral Daily  . ofloxacin  1 drop Right Eye BID  . potassium chloride  20 mEq Oral BID  . rivaroxaban  20 mg Oral Q supper  . senna  1 tablet Oral QHS  . sildenafil  40 mg Oral TID  . spironolactone  12.5 mg Oral Daily  . torsemide  60 mg Oral Daily   Continuous Infusions: . milrinone 0.375 mcg/kg/min (06/29/14 1100)   PRN Meds:.acetaminophen, ALPRAZolam, ondansetron (ZOFRAN) IV, oxyCODONE-acetaminophen **AND** oxyCODONE, zolpidem    Filed Vitals:   06/29/14 0800 06/29/14 0835 06/29/14 1050 06/29/14 1223  BP:  124/70 113/50 113/40  Pulse: 78 82  77  Temp:  97.7 F (36.5 C)  98.5 F (36.9 C)  TempSrc:  Oral  Oral  Resp: 18 17  21   Height:      Weight:      SpO2: 93% 94%  96%    Intake/Output Summary (Last 24 hours) at 06/29/14 1505 Last data filed at 06/29/14 1300  Gross per 24 hour  Intake  750.6 ml  Output   1700 ml  Net -949.4 ml    LABS: Basic Metabolic Panel:  Recent Labs  06/28/14 0430  06/29/14 0500  NA 132* 137  K 3.9 3.3*  CL 100* 97*  CO2 24 29  GLUCOSE 264* 166*  BUN 31* 35*  CREATININE 1.97* 1.80*  CALCIUM 8.9 8.8*   Liver Function Tests: No results for input(s): AST, ALT, ALKPHOS, BILITOT, PROT, ALBUMIN in the last 72 hours. No results for input(s): LIPASE, AMYLASE in the last 72 hours. CBC:  Recent Labs  06/27/14 0410 06/28/14 0430 06/29/14 0830  WBC 7.8 7.0 6.4  NEUTROABS 5.7  --   --   HGB 10.1* 10.6* 10.8*  HCT 31.9* 32.7* 34.0*  MCV 101.6* 100.3* 100.0  PLT 254 268 264   Cardiac Enzymes: No results for input(s): CKTOTAL, CKMB, CKMBINDEX, TROPONINI in the last 72 hours. BNP: Invalid input(s): POCBNP D-Dimer: No results for input(s): DDIMER in the last 72 hours. Hemoglobin A1C:  Recent Labs  06/26/14 1645  HGBA1C 7.7*   Fasting Lipid Panel: No results for input(s): CHOL, HDL, LDLCALC, TRIG, CHOLHDL, LDLDIRECT in the last 72 hours. Thyroid Function Tests: No results for input(s): TSH, T4TOTAL, T3FREE, THYROIDAB in the last 72 hours.  Invalid input(s): FREET3 Anemia Panel: No results for  input(s): VITAMINB12, FOLATE, FERRITIN, TIBC, IRON, RETICCTPCT in the last 72 hours.  RADIOLOGY: No results found.  PHYSICAL EXAM General: NAD Neck: JVP flat cm, no thyromegaly or thyroid nodule.  Lungs: Clear to auscultation bilaterally with normal respiratory effort. CV: Nondisplaced PMI.  Heart regular S1/S2, no S3/S4, no murmur.  Trace ankle edema.   Abdomen: Soft, nontender, no hepatosplenomegaly, no distention.  Neurologic: Alert and oriented x 3.  Psych: Normal affect. Extremities: No clubbing or cyanosis.   TELEMETRY: Reviewed telemetry pt in NSR  ASSESSMENT AND PLAN: 64 yo with history of nonischemic cardiomyopathy/chronic systolic CHF on home milrinone, CKD, diabetes, paroxysmal atrial fibrillation was admitted on 5/18 after RHC/LHC with high filling pressures and pulmonary hypertension and persistently low cardiac index.  1. Acute  on chronic systolic CHF: EF 09-98% on last echo.  Nonischemic cardiomyopathy confirmed by coronary angiography this admission.  Milrinone increased to 0.375 with co-ox 64% this morning.  CVP 3 today and weight down to 232. Co-ox great.  - Can go home today on torsemide 60 mg daily for home. Take metolazone 2.5mg  with kcl 20 for weight 236 or greater.  - Continue digoxin, Toprol XL.  Digoxin level ok.  - Continue spironolactone 12.5 daily (new) - Hydralazine increased to 75 mg tid.  - We have been considering him for LVAD.  Elevated creatinine and very high PA pressure on RHC this admission are barriers. Will continue home milrinone and continue to follow in [facility] Clinic. 2. CKD: 25-30 cc contrast only with angiography this admission.  Creatinine remains below his initial creatinine (2.1).  3. CAD: Nonobstructive by cath this admit  Continue statin.  4. Atrial fibrillation: Paroxysmal.  He is in NSR, continue amiodarone.  5. 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  Now on Revatio at 40 mg tid, tolerated well.    6. Diabetes: Will consult diabetes coordinator.  Check hemoglobin A1c.  7. Dispo: Home today.  Will plan to continue him on new medication regimen and repeat RHC to look for improvement after about a month.   Glori Bickers MD 06/29/2014 3:05 PM

## 2014-06-29 NOTE — Discharge Summary (Signed)
Discharge Summary   Patient ID: XAIDYN KEPNER MRN: [id number], DOB/AGE: 1950/05/05 64 y.o. Admit date: 06/26/2014 D/C date:     06/29/2014  Primary Care Provider: [name], MD Primary Cardiologist: [facility]  Primary Discharge Diagnoses:  1. Acute on chronic systolic CHF 2. CKD stage III  3. 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 Paroxysmal atrial fibrillation, maintaining NSR on amiodarone 5. 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  6. Diabetes mellitus 7. H/o ICD implantation  Comprehensive PMH  Past Medical History  Diagnosis Date  . NICM (nonischemic cardiomyopathy)     a. NICM. EF 2011 25-30%. b. LHC 2009 nonobstructive CAD. c. h/o [employer] Jude ICD 2012. d. Echo 04/2013 - EF 25-30% with mildly decreased RV systolic function. e. Echo 01/2014: EF 20-25%, inf akinesis, RHC. e. Home milrinone started 4/16.  [name] CAD (coronary artery disease)     a. nonobstructive CAD by cath 2009 & 06/2014.  . Obesity   . CVA (cerebral vascular accident)     CVA 2007 without residual deficit  . Pituitary tumor      (nonfunctionging pituitary microadenoma) s/p gamma knife surgery at [facility] 2009 with neuropathy and retinopathy  . Depression   . Erectile dysfunction   . DDD (degenerative disc disease)     Chronic low back pain.   . OSA (obstructive sleep apnea)     a. 4/16 sleep study with severe OSA  . Monoclonal gammopathy   . CKD (chronic kidney disease), stage III   . 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 Hypogonadotropic hypogonadism   . Polycythemia, secondary     improved/resolved  . Hypercholesterolemia   . Back pain     F/B [name]  . Monoclonal gammopathy of unknown significance     per [name]  . Neck pain     F/B [name]  . PAF (paroxysmal atrial fibrillation) 06/04/10    a. On Xarelto. b. DCCV 02/2014 to NSR.  [name] History of diverticulitis of colon   . Type II or unspecified type diabetes mellitus with  neurological manifestations, uncontrolled   . Nonproliferative diabetic retinopathy NOS(362.03)   . Ventricular tachycardia 10/25/13    appropriate ICD shock, VT CL 230-240 msec  . Confusion 02/01/2014  . Hypertension   . AICD (automatic cardioverter/defibrillator) present   . Anxiety   . Peripheral vascular disease     2007  . Chronic systolic CHF (congestive heart failure)   . Hypothyroidism   . PTSD (post-traumatic stress disorder)     a. Anxiety/PTSD from ICD shock  . Lupus anticoagulant positive     a. No h/o DVT. Saw Dr Lindi Adie with hematology.   . Pulmonary hypertension     Hospital Course: Mr. Hiltner is a 64 y/o M with history of chronic systolic CHF (nonischemic cardiomyopathy) s/p ICD, CKD III, prior CVA, paroxysmal atrial fibrillation, lupus anticoagulant positive, anxiety/PTSD from ICD shock, severe OSA, DDD, monoclonal gammopathy of uncertain etiology, DM, hypothyroidism, HLD, nonobstructive CAD by cath in 2009 who was admitted 06/26/2014 with dyspnea.  Patient had [place] 8/15 showing low cardiac output, mildly elevated PCWP and primarily pulmonary venous hypertension. He was transitioned from Lasix to torsemide and started on digoxin. Entresto was started and valsartan stopped.   Adenosine Cardiolite was done in 12/15 showing large area of apical scar but no ischemia. This raised concern for possible ischemic cardiomyopathy. He was admitted later in 12/15 with acute on chronic systolic CHF. He also was noted  to have gone into atrial fibrillation during this hospitalization, amiodarone was started in preparation for possible DCCV. Troponin was mildly elevated in the hospital in the absence of chest pain, likely demand ischemia. He was diuresed with IV Lasix and was discharged soon after. He was cardioverted back to NSR on 02/14/14. He was cardioverted again to NSR in 3/16. He had RHC done 05/08/14 with mixed pulmonary hypertension and PWCP 25, CI 1.75. CPX done 4/16 showed  severe functional limitation. In 4/16, he was started on home milrinone via PICC at 0.25 mcg/kg/min. Co-ox increased from 55% => 72%. Entresto stopped with elevated creatinine.   He presented to the office 06/19/14 with increasing SOB over the last few weeks. He reported fatigue and dyspnea after walking 100-200 feet. Short of breath with steps. He was on a lower torsemide dose than in the past. Weight was up 7 lbs. No chest pain.He has significant anxiety and has been seeing a psychologist Haematologist). Dr. Aundra Dubin spent time discusing LVAD with the patient. Dr. Aundra Dubin reported he is not a transplant candidate at this point (weight, pulmonary hypertension). He also has significant barriers to LVAD (elevated creatinine, severe anxiety, severe low back pain, pulmonary hypertension).Dr. Aundra Dubin increased tosemide, hydralazine and Imdur and brought the patient in for [facility] on 06/26/14 which showed:  1. Nonobstructive CAD: Nonischemic cardiomyopathy.  2. Elevated left and right heart filling pressures. Cardiac index by Fick remains quite low (thermodilution was very variable so think Fick more accurate).  3. 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  4. 30 cc contrast used RHC Hemodynamics (mmHg) RA mean 17 RV 105/18 PA 102/37, mean 60 PCWP mean 37 LV 134/32 AO 122/72 Oxygen saturations: PA 48% AO 94% Cardiac Output (Fick) 4.41 Cardiac Index (Fick) 1.95 PVR 5.2 WU   Dr. Aundra Dubin recommended to admit, increase milrinone, diurese and add Revatio TID. Imdur stopped because Revatio started. He was placed on a Lasix drip. Spironolactone 12.5mg  dialy was added. Revatio was further titrated. Yesterday he had diuresed well and was able to transition back to oral torsmide. Hydralazine was further. Dr. Aundra Dubin reported that with respect to LVAD, elevated creatinine and very high PA pressure on RHC this admission are barriers. His Cr remained stable during admission, at times lower  than baseline of 2.1. Today his CVP is 3 and Co-Ox 71%. I/O's have been difficult to measure but weight went from 241 on admission to 232 by day of discharge. The plan is to continue him on the new regimen and repeat RHC in about a month to look for improvement. He will get close follow-up in the HF clinic. I have sent a message to the HF clinic nurse/scheduler requesting a follow-up appointment, and office will call the patient with this information. He will go home on 0.351mcg/kg/min of milrinone.  Of note, given the patient's weight and age, his CrCl is actually 73ml/min and pharmacy has increased his Xarelto to 20mg  qsupper. I verified the plan to continue this higher dose at discharge with Dr. Haroldine Laws since he now meets parameters for this. Since the patient uses a Investment banker, corporate, we discussed the best way to arrange his home medications is to give him a printed 30 day supply. If he is doing well on this new regimen at follow-up he will need rx sent in to his mail-order. The prescriptions that were changed are hydralazine, potassium, Xarelto, sildenafil, spironolactone. Milrinone was changed.   Discharge Vitals: Blood pressure 127/50, pulse 77, temperature 98.5 F (36.9 C), temperature  source Oral, resp. rate 21, height 5\' 10"  (1.778 m), weight 232 lb 4.8 oz (105.371 kg), SpO2 96 %.  Labs: Lab Results  Component Value Date   WBC 6.4 06/29/2014   HGB 10.8* 06/29/2014   HCT 34.0* 06/29/2014   MCV 100.0 06/29/2014   PLT 264 06/29/2014    Recent Labs Lab 06/29/14 0500  NA 137  K 3.3*  CL 97*  CO2 29  BUN 35*  CREATININE 1.80*  CALCIUM 8.8*  GLUCOSE 166*    Lab Results  Component Value Date   CHOL 92 05/28/2014   HDL 28* 05/28/2014   LDLCALC 37 05/28/2014   TRIG 137 05/28/2014     Diagnostic Studies/Procedures   L/RHC as above.  Discharge Medications   Current Discharge Medication List    START taking these medications   Details  sildenafil (REVATIO) 20 MG  tablet Take 2 tablets (40 mg total) by mouth 3 (three) times daily. Qty: 180 tablet, Refills: 0    spironolactone (ALDACTONE) 25 MG tablet Take 0.5 tablets (12.5 mg total) by mouth daily. Qty: 30 tablet, Refills: 0      CONTINUE these medications which have CHANGED   Details  hydrALAZINE (APRESOLINE) 50 MG tablet Take 1.5 tablets (75 mg total) by mouth 3 (three) times daily. Qty: 135 tablet, Refills: 0    milrinone (PRIMACOR) 20 MG/100ML SOLN infusion Inject 39.525 mcg/min into the vein continuous. (0.346mcg/kg/min)    Potassium Chloride ER 20 MEQ TBCR Take 20 mEq by mouth 2 (two) times daily. Qty: 60 tablet, Refills: 0    Rivaroxaban (XARELTO) 20 MG TABS tablet Take 1 tablet (20 mg total) by mouth daily with supper. Qty: 30 tablet, Refills: 0      CONTINUE these medications which have NOT CHANGED   Details  ALPRAZolam (XANAX) 0.5 MG tablet Take 0.5 mg by mouth 3 (three) times daily as needed for anxiety or sleep.    amiodarone (PACERONE) 200 MG tablet Take 1 tablet (200 mg total) by mouth daily.     atorvastatin (LIPITOR) 40 MG tablet TAKE 1 TABLET DAILY     digoxin (LANOXIN) 0.125 MG tablet Take 0.5 tablets (0.0625 mg total) by mouth daily.     DULoxetine (CYMBALTA) 30 MG capsule Take 60 mg by mouth 2 (two) times daily.     insulin aspart (NOVOLOG) 100 UNIT/ML injection Inject 30 Units into the skin 3 (three) times daily before meals.     ketorolac (ACULAR) 0.5 % ophthalmic solution Place 1 drop into the left eye 2 (two) times daily. Start 1 week prior to surgery     LANTUS SOLOSTAR 100 UNIT/ML Solostar Pen Inject 32 Units into the skin at bedtime.     levothyroxine (SYNTHROID, LEVOTHROID) 25 MCG tablet Take 50 mcg by mouth daily before breakfast.      Melatonin 10 MG TABS Take 10 mg by mouth at bedtime.    metoprolol succinate (TOPROL-XL) 25 MG 24 hr tablet Take 1 tablet (25 mg total) by mouth 2 (two) times daily before a meal.     Multiple Vitamin (MULITIVITAMIN  WITH MINERALS) TABS Take 1 tablet by mouth daily.    ofloxacin (OCUFLOX) 0.3 % ophthalmic solution Place 1 drop into the right eye 2 (two) times daily. Start 72 hours prior to surgery     oxyCODONE-acetaminophen (PERCOCET) 10-325 MG per tablet Take 1 tablet by mouth every 4 (four) hours as needed for pain. For pain    senna (SENOKOT) 8.6 MG tablet Take 1 tablet  by mouth at bedtime.     torsemide (DEMADEX) 20 MG tablet Take 3 tablets (60 mg total) by mouth daily.    Associated Diagnoses: Chronic systolic heart failure; Paroxysmal atrial fibrillation    zolpidem (AMBIEN) 5 MG tablet Take 1 tablet (5 mg total) by mouth at bedtime as needed for sleep.     GLUCAGON EMERGENCY 1 MG injection Inject 1 mg as directed once. For hypotension      STOP taking these medications     isosorbide mononitrate (IMDUR) 60 MG 24 hr tablet         Disposition   The patient will be discharged in stable condition to home. Discharge Instructions    Diet - low sodium heart healthy    Complete by:  As directed      Increase activity slowly    Complete by:  As directed   No lifting over 5 lbs for 1 week. No sexual activity for 1 week. Keep procedure site clean & dry. If you notice increased pain, swelling, bleeding or pus, call/return!  You may shower, but no soaking baths/hot tubs/pools for 1 week.  The following medication doses have changed: Xarelto, milrinone, potassium, hydralazine The following medications are new: spironolactone and Revatio. Your IMDUR (isosorbide mononitrate) was stopped. Do not take ANY medications that contain nitroglycerin or nitrates.          Follow-up Information    Follow up with Loralie Champagne, MD.   Specialty:  Cardiology   Why:  Office will call you for your followup appointment. Call office if you have not heard back in 3 days.   Contact information:   [address] [place] [address] [phone]         Duration of Discharge Encounter:  Greater than 30 minutes including physician and PA time.  SignedMelina Copa PA-C 06/29/2014, 4:31 PM  Patient seen and examined with Melina Copa, PA-C. We discussed all aspects of the encounter. I agree with the assessment and plan as stated above.   Agree. He is ready for discharge on increased dose of milrinone. Will f/u in HF Clinic.   Bensimhon, Daniel,MD 3:08 PM

## 2014-06-29 NOTE — Care Management Note (Signed)
Case Management Note  Patient Details  Name: Larry [name] MRN: [id number] Date of Birth: 04-08-1950  Subjective/Objective:                    Action/Plan:   Expected Discharge Date:                  Expected Discharge Plan:  [place]  In-House Referral:     Discharge planning Services  CM Consult  Post Acute Care Choice:  Resumption of Svcs/PTA Provider Choice offered to:     DME Arranged:  IV pump/equipment DME Agency:  [place]:  RN (social work) [place]:  [place]  Status of Service:     Medicare Important Message Given:    Date Medicare IM Given:    Medicare IM give by:    Date Additional Medicare IM Given:    Additional Medicare Important Message give by:     If discussed at [place] of Stay Meetings, dates discussed:    Additional Comments: CM met Dr. B on unit who placed the gtt rate on the [facility] order for resumption of milrinone.  Cm spoke with [facility] rep, tiffany who is arranging with Pam chandler the Rn to come to the unit to hook up pt to milrinone so pt can be discharged.  No other CM needs were communicated. Dellie Catholic, RN 06/29/2014, 4:22 PM

## 2014-06-29 NOTE — Progress Notes (Signed)
[place] for xarelto Indication: atrial fibrillation  Allergies  Allergen Reactions  . Bydureon [Exenatide] Other (See Comments)    sweating  . Losartan Potassium Other (See Comments)    insomnia    Patient Measurements: Height: 5\' 10"  (177.8 cm) Weight: 232 lb 4.8 oz (105.371 kg) IBW/kg (Calculated) : 73   Vital Signs: Temp: 97.7 F (36.5 C) (05/21 0835) Temp Source: Oral (05/21 0835) BP: 113/50 mmHg (05/21 1050) Pulse Rate: 82 (05/21 0835)  Labs:  Recent Labs  06/27/14 0410 06/28/14 0430 06/29/14 0500 06/29/14 0830  HGB 10.1* 10.6*  --  10.8*  HCT 31.9* 32.7*  --  34.0*  PLT 254 268  --  264  CREATININE 1.61* 1.97* 1.80*  --     Estimated Creatinine Clearance: 52.4 mL/min (by C-G formula based on Cr of 1.8).   Medical History: Past Medical History  Diagnosis Date  . Cardiomyopathy     nonischemic (EF 25%)  . CAD (coronary artery disease)     nonobstructive CAD by cath 2009  . Obesity   . CVA (cerebral vascular accident)     CVA 2007 without residual deficit  . Pituitary tumor      (nonfunctionging pituitary microadenoma) s/p gamma knife surgery at [facility] 2009 with neuropathy and retinopathy  . Depression   . Erectile dysfunction   . DDD (degenerative disc disease)   . OSA (obstructive sleep apnea)   . Monoclonal gammopathy   . CRI (chronic renal insufficiency)     CRI (baseline creatinine 1.6)  . CKD (chronic kidney disease), stage III     moderate  . 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 Hypogonadotropic hypogonadism   . Polycythemia, secondary     improved/resolved  . Hypercholesterolemia   . Back pain     F/B [name]  . Monoclonal gammopathy of unknown significance     per [name]  . 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 Neck pain     F/B [name]  . PAF (paroxysmal atrial fibrillation) 06/04/10    Hx of, has device  . History of diverticulitis  of colon   . Type II or unspecified type diabetes mellitus with neurological manifestations, uncontrolled 2000  . Nonproliferative diabetic retinopathy NOS(362.03)   . Ventricular tachycardia 10/25/13    appropriate ICD shock, VT CL 230-240 msec  . Confusion 02/01/2014  . Hypertension   . Anginal pain   . Dysrhythmia   . AICD (automatic cardioverter/defibrillator) present   . Presence of permanent cardiac pacemaker   . Anxiety   . Shortness of breath dyspnea   . Peripheral vascular disease     2007   Assessment: 64 yo continuing on xarelto for PAF hx post heart cath. Patient is on reduced dose xarelto for his PAF pta. No bleeding/hematoma noted post cath. Baseline hgb 10.9. CKD noted with scr 2.17 on admit; est crcl ~55 - Discussed increasing dose with cards and agreed. CHADSVASC 5  SCr fluctuating, decreased on 5/21, 2.1>>1.6>>1.97>>1.8, CrCl~52, UOP good 0.9. CBC stable. No bleed noted.  Continue to monitor renal function and decrease back to pta 15 mg daily dose if appropriate   Goal of Therapy:  Monitor platelets by anticoagulation protocol: Yes   Plan:  Xarelto 20mg  qsupper - Mon SCr trend and decrease xarelto back to 15 if appropriate (home dose 15mg  qd)  CBC q72h  Mon s/sx bleed   Elicia Lamp, PharmD  Clinical Pharmacist - Resident Pager [phone] 06/29/2014 12:15 PM

## 2014-06-29 NOTE — Progress Notes (Addendum)
Capped of cvp line, and stopped Milrinone as Home health RN hooked up home milrinone drip.  Pt alert and oriented will be transported down to car via wheelchair with RN. Larry [name]

## 2014-07-02 NOTE — Progress Notes (Signed)
[place] CLINICAL SOCIAL WORK DOCUMENTATION LVAD (Left Ventricular Assist Device) Psychosocial Screening Please remember that all information is confidential within the members of the VAD team and [facility]  06/19/2014 8:30AM  Patient:  DERRIS MILLAN  MRN:  [id number]  Account:  [id number]  Clinical Social Worker:  Ulla Gallo, LCSW Date/Time Initiated:   06/19/2014 08:30 AM Referral Source:    Zada Girt, [place] Coordinator  Referral Reason:   LVAD Placement  Source of Information:   Patient, wife and medical record   PATIENT DEMOGRAPHICS NAME:   Gurshan Settlemire     DOB:  Dec 03, 1950  SS#:  [id number] Address:   [address]  [place]  [place] [address] Home Phone:  [phone]  Cell Phone:   [phone] Marital Status:   married     Primary Language:  ENGLISH  Faith:  [employer] Adherence with Medical Regimen:   compliant  Medication Adherence:   compliant  Physician appointment attendance:   compliant   Do you have a Living Will or Medical POA?  N Would you like to complete a Living Will and Medical POA prior to surgery?  Y Are you currently a DNR?  N Do you have a MOST form?  N Would you like to review one?  N Do you have goals of care?  Y   Have you had a consult with the palliative care team at [facility]?  Y Comment:  Patient and wife working on Financial controller.  Psychological Health Appearance:   Patient was dressed neatly and well groomed.  Mental status:   alert and oriented  Eye Contact:   good  Thought Content:   Coherent  Speech:   monotone  Mood:   Patient was interactive with discussion but flat.  Affect:   Patient has flat affect  Insight:   Patient asked appropriate questions.  Judgment:   Good  Interaction Style:   Patient asked appropriate questions.   Family/Social Information Who lives in your home? Name9  Georganna Skeans    Relationship to you9  wife (married 32 years)    Do you have a plan for  child care if relevant?   n/a  List family members outside the home (parents, friends, Theme park manager, etc..) Name2  Amber Donia Ast  Ed Dunne  Margaret Wilmouth   Age2  64yo   Relationship to [place]  daughter Lady Gary)  sister  brother in law  mother in law    Please list people who give you emotional support (family, parents, friends, pastor, etc..) Name3  Ray Calhoun    Relationship to [phone]  pastor    Who is your primary and backup support pre and post-surgery? Explain the relationships i.e. strengths/weakness, etc.:   Patient's wife Peter Congo will be the primary caregiver. Patient and wife appear to have a very close relationship and have been married for 35 years.  Secondary Caregiver identified:   Patient and wife report multiple back up caregivers: Daughter, sister and mother in law.   Legal Do you currently have any legal issues/problems?   Yes, Patient is POA for elderly mother and has a pending lawsuit over money borrowed by family member from mother.  Durable POA or Legal Next of Kin:   wife- Clements Toro  [phone]   Living situation Travel distance to [facility]:  23 miles  Second Hand Smoke Exposure:   no  Self- Care level:   Independent  Ambulation:   Independent  Transportation:  drives self  Limitations:   no  Barriers impacting ability to participate in care:   no   Community Are you active with community agencies/resources?   [facility]  Are you active in a church, synagogue, mosque, or other faith based community?   [employer]  What other sources do you have for spiritual support?   none  Are you active in any clubs or social organizations?   none  What do you do for fun? hobbies, interests?  play golf, shoot guns   Education/Work information What is the last grade of school you completed?  12th  Preferred method of learning? (Written, verbal, hands on):   Combo- hands on and written   Do you have any problems with reading or writing?  no  Are you currently employed? If no, when were you last employed?   2007 last employed  Name of employer:   [employer]  Please describe what kind of work you do/did?   repaired knitting machines and loaded trucks  How long have you worked there?   30 years  If you are not currently working, do you plan to return to work after VAD surgery?   no  If yes, what type of employment do you hope to find?  Are you interested in job training or learning new skills?   no  Did you serve in the [place]? If so, what branch?   no   Financial Information What is your source of income?    SSD and wife is employed  Do you have difficulty meeting your monthly expenses?   no  If yes, which ones?   How do you usually cope with this?    n/a  Primary Health Insurance:    [employer]:    pending Medicare  Have you applied for Medicaid?    no  Have you applied for Social Security Disability?    receives  Do you have prescription coverage?    yes  What are your prescription co-pays?    varies  Are you required to use a certain pharmacy?    [facility]  Do you have a mail order option for your prescriptions?    [facility]  If yes, what pharmacy do you use for mail order?    [facility]  Have you ever refused medication due to cost?    no  Discuss monthly cost for dressing supplies post procedure $150-300    Discussed and reviewed  Can you budget for this monthly expense?    yes   Medical Information Briefly describe your medical history, surgeries and why you are here for evaluation.    Patient and wife shared long history beginning with DM in 2006. Patient suffered a stroke in 2007 along with a brain tumor. he was diagnosed with HTN at age 82. He had a cath in 2009, pacemaker in 2010 and decline in heart failure lead to milirone in 2016.  Are you able to complete your ADL's?    Independent  Do you have any  history of emotional, medical, physical or verbal trauma?    none noted  Do you have any family history of heart problems?    Mother and Father had heart disease. Brother died at age 42 from MI.  Do you smoke? If so, what is the amount and frequency?    no  Do you drink alcohol? If so, how many drinks a day/week?    No- quit in 2010.  he states he was "very social on weekends". Drink of choice was crown royal.  Have you ever used illegal drugs or misused medications?    no  If yes, what drugs do you use and how often?   Have you ever been treated for substance abuse?    no  If yes, where and when were you treated?   Are you currently using illegal substances?    no   Mental Health History Have you ever had problems with depression, anxiety or other mental health issues?   Patient reports significant history of Depression and Anxiety. Patient also mentioned sleep issues and claustrophobia.  Wife reports that in October, 2015 he started OCD like behaviors. She states he has a nightly routine and "things must always be in order". Patient does not have a diagnosis of OCD and wife attributes some of it to his anxiety.  If yes, have you seen a counselor, psychiatrist or therapist?    Patient reports he is seeing Dr. Enis Gash (psychologist) monthly for therapy.  If you are currently experiencing problems are you interested in talking with a professional?    Already in active therapy  Would you be interested in participating in an LVAD support group?  Y LVAD support group for: Patient Caregivers patient Have you or are you taking any medications for anxiety/depression or any mental health concerns?    Yes for depression and anxiety.  If yes, Please list the medications?    Cymbalta and Xanax  How have you been feeling in the past year?    "terrible, tired and high anxiety"  How do you handle stressful situations?   "turn up the air conditioning, take a xanax and do my breathing"  What are your  coping strategies? Please List:    move to a different temperature and utilize breathing exercises.  Have you had any past or current thoughts of suicide?    no  How many hours do you sleep at night?    4-5 hours  How is your appetite?   good  PHQ2- Depression Screen:    1  PHQ9 Depression screen (only complete if the PHQ2 is positive):    n/a   Plan for VAD Implementation Do you know and understand what happens during VAD surgery?  Please describe your thoughts:   Patient verbalizes understanding of what happens during surgery and post op.  What do you know about the risks of any major surgery or use of general anesthesia?    Patient verbalizes understanding of risks of stroke, infection and death.  What do you know about the risks and side effects associated with VAD surgery?    Patient verbalizes understanding of stroke, infection and death. Patient's understanding was accurate and appropriate.  Explain what will happen right after surgery?    Patient understands transfer from OR to recovery to ICU. He understands intubation and recovery process at length. Patient shared concerns about his anxiety, back pain and claustrophobia as it relates to intubation and recovery. Patient concerned about lengthy time spent on his back.  Information obtained from:    Patient, wife, MD, LVAD Coordinator and brochures.  What is your plan for transportation for the first 8 weeks post-surgery? (Patients are not recommended to drive post-surgery for 8 weeks)    Patient's wife will be primary driver.  Driver: Archie Patten  Valid license:    yes  Working Conservator, museum/gallery:    2007 Silverado  2014 Nissan Altima  Airbags:  yes  Do you plan to disarm the airbags- there is a risk of discharging the device if the airbag were to deploy.   will consider disarming.  What do you know about your diet post-surgery?    heart healthy  How do you plan to monitor your medications, current and  future?    pill box wife prepours  How do you plan to complete ADL's post-surgery (Shower, dress, etc.)?    wife assists  Will it be difficult to ask for help for your caregivers? If so, explain:    no  Please explain what you hope will be improved about your life as a result of receiving the VAD    "will be able to do more"  Please tell me your biggest concern or fear about receiving the VAD?    "won't work" "it willl fail" "infections"  How do you cope with your concerns/fears?    "I am ok as long as I am not hurting"  Please explain your understanding of how your body will change? Are you worried about these changes?    "I had some anxiety at first after my pacemaker but the longer I had it the easier it became."  Do you see any barriers to your surgery or follow up? If yes, please explain:   Patient denies any barriers.     Understanding of LVAD Surgical procedures and risks:    Discussed and reviewed  Electrical need for LVAD:    Discussed and reviewed  Safety precautions with LVAD (Water, etc.):   Discussed and reviewed  Potential side effects with LVAD:    Discussed and reviewed  Types of Advanced heart failure therapies available:    Discussed and reviewed  LVAD daily self-care (dressing changes, computer check, extra supplies):    Discussed and reviewed  Outpatient follow-up (follow-up in LVAD clinic; monitoring blood thinners)    discussed and reviewed  Need for emergency planning:    Discussed and reviewed  Expectations for LVAD:    "get up and jump around"  Current level of motivation to prepare for LVAD:    Patient is willing and motivated  Patient's perception of need for LVAD:    Patient is ready  Present level of consent for LVAD:    Motivated and ready  Reasons for seeking LVAD:    " a better life"   Psychosocial Protective Factors  Patient has a loving and supportive wife who is very involved with his care needs. He reports he is compliant with medical  regimen and denies any concerns with follow up. He is in process of completing Advanced Directives and has met with Palliative Care. He is well groomed and despite flat affect was very interactive and asked appropriate questions regarding LVAD implantation. He has good family support to assist with back up caregiving according to his wife. He has no issues with transportation.  He is involved with his church for spiritual support. He denies any financial concerns as he receives SSD and wife is employed and carries insurance. He has a good prescription plan and denies any concerns with obtaining medications. He does not smoke and quit drinking alcohol in 2010. He denies any history of trauma or any substance abuse. Psychosocial Risk Factors  Patient admits to significant history of anxiety and depression. Although he scored a 1 on the PHQ2 he sees a psychologist monthly. He takes cymbalta and xanax and states that he also has claustrophobia at times. He carries his xanax and a bottle  of water at all times in the event of an anxiety attack. He was very open about his mental health and feels he has good strategies to cope with his illness. Wife states that she too has some anxiety/depression and takes zoloft and "another pill".  Clinical Intervention: CSW recommends further discussion with patient's psychologist Dr. Enis Gash to gather additional information on mental health and needed coping mechanisms. CSW will provide ongoing support to patient  and wife and encourage attendance at support group.   Educated patient/family on the following Caregiver(s) role responsibilities:   Discussed and reviewed. Wife appears motivated as she states "I am tired of all the balls in the air" She is motivated to support him to "feel better".  Financial planning for LVAD:    discussed and reviewed  Role of Clinical Social Worker:    discussed and reviewed  Signs of depression and anxiety:    Discussed and reviewed. Patient  well aware of signs and has plans in place for coping.  Support planning for LVAD:    Discussed and reviewed  LVAD process:    Discussed and reviewed  Caregiver contract/agreement:   Discussed and reviewed. Contract agreement reviewed with patient and caregiver by LVAD Coordinator.  Discussed Referral(s) to:    CSW discussed support group and contact with Dr. Enis Gash for additional information and support.  Community Resources:    Discussed and patient denies need at this time but open to attendance at support group post implantation.  Clinical Impression Recommendations:    Mr. Waybright is a good candidate for LVAD implantation. CSW requests additional support and coordination with psychologist Dr. Enis Gash to help support patient in recovery process due to anxiety and depression. His wife is very supportive and motivated for recovery and "a better life". Wife is able to take FMLA for limited time (2 weeks) but noted additional family support. CSW will continue to monitor and provide support throughout recovery. Raquel Sarna, [place]

## 2014-07-03 ENCOUNTER — Encounter: Payer: Self-pay | Admitting: Cardiology

## 2014-07-03 ENCOUNTER — Telehealth: Payer: Self-pay

## 2014-07-03 ENCOUNTER — Encounter: Payer: Self-pay | Admitting: Pulmonary Disease

## 2014-07-03 ENCOUNTER — Ambulatory Visit (INDEPENDENT_AMBULATORY_CARE_PROVIDER_SITE_OTHER): Payer: 59 | Admitting: Pulmonary Disease

## 2014-07-03 VITALS — BP 142/82 | HR 69 | Ht 70.0 in | Wt 237.4 lb

## 2014-07-03 DIAGNOSIS — G4737 Central sleep apnea in conditions classified elsewhere: Secondary | ICD-10-CM | POA: Diagnosis not present

## 2014-07-03 DIAGNOSIS — G4733 Obstructive sleep apnea (adult) (pediatric): Secondary | ICD-10-CM | POA: Diagnosis not present

## 2014-07-03 DIAGNOSIS — G4731 Primary central sleep apnea: Secondary | ICD-10-CM

## 2014-07-03 NOTE — Telephone Encounter (Signed)
Prior auth obtained for Sildenafil 20mg  tabs thru [facility]. Sent to his local pharmacy.

## 2014-07-03 NOTE — Patient Instructions (Signed)
Will arrange for BiPAP titration study in sleep lab Will call to schedule follow up after sleep study reviewed

## 2014-07-03 NOTE — Progress Notes (Signed)
Chief Complaint  Patient presents with  . Follow-up    Review sleep study from 05/22/2014. Still using Ambien 5mg  as needed alternating with Melatonin.     History of Present Illness: Larry [name] is a 64 y.o. male with obstructive sleep apnea.  He is here to review his sleep study.  This showed severe sleep apnea.  He had sub optimal titration due to obstructive and central events.   TESTS: Echo 05/02/13 >> EF 25 to 14%, grade 2 diastolic dysfx PSG 4/81/85 >> AHI 62.4, SaO2 low 76%.  Sup-optimal titration >> OSA/CSA with CPAP, not enough time with BiPAP.  Past medical hx >> NICM, CAD, s/p AICD, HLD, PAF, HTN, PVD, CVA, Pituitary adenoma, Depression, MUGA, CKD 3, DM with neuropathy, Hypothyroidism, Lupus anticoagulant positive  Past surgical hx, Medications, Allergies, Family hx, Social hx all reviewed.   Physical Exam: Blood pressure 142/82, pulse 69, height 5\' 10"  (1.778 m), weight 237 lb 6.4 oz (107.684 kg), SpO2 96 %. Body mass index is 34.06 kg/(m^2).  General - No distress ENT - No sinus tenderness, no oral exudate, no LAN Cardiac - s1s2 regular, no murmur Chest - No wheeze/rales/dullness Back - No focal tenderness Abd - Soft, non-tender Ext - No edema Neuro - Normal strength Skin - No rashes Psych - normal mood, and behavior   Assessment/Plan:  Complex sleep apnea. I have reviewed the recent sleep study results with the patient.  We discussed how sleep apnea can affect various health problems including risks for hypertension, cardiovascular disease, and diabetes.  We also discussed how sleep disruption can increase risks for accident, such as while driving.  Weight loss as a means of improving sleep apnea was also reviewed.  Additional treatment options discussed were CPAP therapy, oral appliance, and surgical intervention. Plan: - will arrange for in lab BiPAP titration study   Chesley Mires, MD [place] Pager:   [phone]

## 2014-07-04 ENCOUNTER — Ambulatory Visit (HOSPITAL_COMMUNITY)
Admission: RE | Admit: 2014-07-04 | Discharge: 2014-07-04 | Disposition: A | Discharge status: Home / Self Care | Payer: 59 | Source: Ambulatory Visit | Attending: Cardiology | Admitting: Cardiology

## 2014-07-04 ENCOUNTER — Telehealth (HOSPITAL_COMMUNITY): Payer: Self-pay | Admitting: *Deleted

## 2014-07-04 VITALS — BP 136/58 | HR 72 | Wt 238.2 lb

## 2014-07-04 DIAGNOSIS — N183 Chronic kidney disease, stage 3 unspecified: Secondary | ICD-10-CM

## 2014-07-04 DIAGNOSIS — R9431 Abnormal electrocardiogram [ECG] [EKG]: Secondary | ICD-10-CM | POA: Diagnosis not present

## 2014-07-04 DIAGNOSIS — I5022 Chronic systolic (congestive) heart failure: Secondary | ICD-10-CM

## 2014-07-04 DIAGNOSIS — I480 Paroxysmal atrial fibrillation: Secondary | ICD-10-CM

## 2014-07-04 NOTE — Progress Notes (Signed)
Patient ID: Larry [name], male   DOB: 1950/02/11, 64 y.o.   MRN: [id number] PCP: Dr. Rex Kras Endocrinologist: Dr Buddy Duty  64 yo with history of chronic systolic CHF (nonischemic cardiomyopathy), CKD, prior CVA, and paroxysmal atrial fibrillation.  Patient says that he has been known to have a low EF for years.  He has a [employer], political party ICD.  He had LHC in 2009 with nonobstructive disease. Last echo in 12/15 showed EF 20-25% with normal RV size and systolic function.     Patient had [place] 8/15 showing low cardiac output, mildly elevated PCWP and primarily pulmonary venous hypertension.  He was transitioned from Lasix to torsemide and started on digoxin.  Entresto was started and valsartan stopped.   Adenosine Cardiolite was done in 12/15 showing large area of apical scar but no ischemia.  This raised concern for possible ischemic cardiomyopathy.  He was admitted later in 12/15 with acute on chronic systolic CHF.  He also was noted to have gone into atrial fibrillation during this hospitalization, amiodarone was started in preparation for possible DCCV.  Troponin was mildly elevated in the hospital in the absence of chest pain, likely demand ischemia.  He was diuresed with IV Lasix and was discharged soon after.  He was cardioverted back to NSR on 02/14/14.  He was cardioverted again to NSR in 3/16.  He had RHC done 05/08/14 with mixed pulmonary hypertension and PWCP 25, CI 1.75.  CPX done 4/16 showed severe functional limitation.  In 4/16, he was started on home milrinone via PICC at 0.25 mcg/kg/min.  Co-ox increased from 55% => 72%.  Entresto stopped with elevated creatinine.   Larry [name] initially remained breathless with NYHA class III symptoms even after starting milrinone.  He came back for LHC/RHC in 5/16.  This showed nonobstructive CAD and elevated filling pressures including severe pulmonary hypertension.  CI was 1.95 by Fick.  I increased milrinone to 0.375, started him on Revatio, and diuresed him.   Today,  weight is down 7 lbs. He feels much better.  No dyspnea walking on flat ground. He can climb steps without much problem.  He is still waiting to get approval for Revatio at home. He has complex sleep apnea and needs Bipap titration.  No orthopnea/PND.  No chest pain. He remains in NSR today.    ECG: NSR, inferior and anterior Qs  Labs (2/15): LDL 103 Labs (3/15): K 4.1, creatinine 1.5, BNP 164 Labs (4/15): K 3.5, creatinine 3.3 => 1.9 => 2.1, BNP 96 Labs (4/15): K 4.3, creatinine 1.43, glucose 308 Labs (5/15): K 5.1 Creatinine 1.35  Labs (8/15): K 5 => 4.7 => 3.8, creatinine 1.19 => 1.57 => 1.6, digoxin 1.1 Labs (9/15): K 4.6, creatinine 1.32 => 1.9 => 1.1, digoxin 0.6 => 1.2 (not trough) Labs (10/15): K 4.5, creatinine 1.36, pro-BNP 1261 Labs (12/15): TSH 8.6 (elevated), free T3/free T4 normal, K 4.5, creatinine 1.71, digoxin 0.6, HCT 44.1, AST 38, ALT 44, TSH normal Labs (1/16): K 5.1, creatinine 1.96, LFTs normal, BNP 952 Labs (3/16): K 4.6, creatinine 2.06, LFTs normal, digoxin 1.8 (decreased digoxin to 0.0625) Labs (4/16): K 3.5, creatinine 1.83, LDL 37, HDL 28, HIV negative, lupus anticoagulant +.  Labs (5/16): K 3.3, creatinine 1.8, digoxin 0.4, HCT 34  PMH: 1. Chronic systolic CHF: ? Nonischemic cardiomyopathy.  Most recent echo in 3/15 with EF 25-30%, moderate LV dilation, mild LVH, moderate diastolic dysfunction, IVC normal, mildly decreased RV systolic function.  Prior echo in 2011 also with EF  25-30%.  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  St Jude ICD. CPX 05/2013: FVC 3.89 (82%), FEV1 2.99 (83%), FEV1/FVC 77%, Peak HR 118 (74% predicted max), Peak VO2 12.4 (51% of predicted; when corrected to IBW =17.7), VE/VCO2 29.9, OUES 1.39, Peak RER 1.21.  RHC (8/15) with mean RA 6, PA 60/21 mean 36, mean PCWP 21, CI 1.81, PVR 3.6 WU.  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  Echo (12/15) with  EF 20-25%, inferior akinesis, normal RV size and systolic function.  RHC (3/16) with mean RA 7, PA 76/24 mean 43, mean PCWP 25, CI 1.75, PVR 4.62. CPX (4/16) with RER 0.99, peak VO2 7.1, slope 36 => severely decreased functional capacity though submaximal. Home milrinone at 0.25 mcg/kg/min begun 4/16, increased to 0.375 in 5/16.  LHC/RHC (5/16) with nonobstructive CAD, mean RA 17, PA 102/37 mean 60, mean PCWP 37, CI 1.95 Fick/2.59 thermo, PVR 5 WU.  2. Type II diabetes 3. CKD 4. Monoclonal gammopathy of uncertain etiology.  5. CVA 2007 6. Degenerative disc disease: Chronic low back pain.   7. Pituitary microadenoma: Nonfunctioning, s/p surgery in 2009.  8. OSA: Complex, awaiting Bipap titration. 9. Depression 10. Hypothyroidism 11. Hyperlipidemia 12. Atrial fibrillation:   Patient is on Xarelto. DCCV 02/14/14 to NSR.  13. 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  Saw Dr Lindi Adie with hematology.   SH: Married, former cigar smoker, unemployed.   FH: Father with MI x 2 and CHF, strong history of CAD on his father's side.   ROS: All systems reviewed and negative except as per HPI.   Current Outpatient Prescriptions  Medication Sig Dispense Refill  . ALPRAZolam (XANAX) 0.5 MG tablet Take 0.5 mg by mouth 3 (three) times daily as needed for anxiety or sleep.    [name] amiodarone (PACERONE) 200 MG tablet Take 1 tablet (200 mg total) by mouth daily. 30 tablet 3  . atorvastatin (LIPITOR) 40 MG tablet TAKE 1 TABLET DAILY 90 tablet 3  . digoxin (LANOXIN) 0.125 MG tablet Take 0.5 tablets (0.0625 mg total) by mouth daily. 90 tablet 3  . DULoxetine (CYMBALTA) 30 MG capsule Take 60 mg by mouth 2 (two) times daily.     [name] GLUCAGON EMERGENCY 1 MG injection Inject 1 mg as directed once. For hypotension    . hydrALAZINE (APRESOLINE) 50 MG tablet Take 1.5 tablets (75 mg total) by mouth 3 (three) times daily. 135 tablet 0  . insulin aspart (NOVOLOG) 100 UNIT/ML injection Inject 30  Units into the skin 3 (three) times daily before meals.     [name] ketorolac (ACULAR) 0.5 % ophthalmic solution Place 1 drop into the left eye 2 (two) times daily. Start 1 week prior to surgery  4  . LANTUS SOLOSTAR 100 UNIT/ML Solostar Pen Inject 32 Units into the skin at bedtime.     [name] levothyroxine (SYNTHROID, LEVOTHROID) 25 MCG tablet Take 50 mcg by mouth daily before breakfast.   5  . Melatonin 10 MG TABS Take 10 mg by mouth at bedtime.    . metoprolol succinate (TOPROL-XL) 25 MG 24 hr tablet Take 1 tablet (25 mg total) by mouth 2 (two) times daily before a meal. 60 tablet 6  . milrinone (PRIMACOR) 20 MG/100ML SOLN infusion Inject 39.525 mcg/min into the vein continuous. (0.375mcg/kg/min)    . Multiple Vitamin (MULITIVITAMIN WITH MINERALS) TABS Take 1 tablet by mouth daily.    [name] ofloxacin (  OCUFLOX) 0.3 % ophthalmic solution Place 1 drop into the right eye 2 (two) times daily. Start 72 hours prior to surgery  1  . oxyCODONE-acetaminophen (PERCOCET) 10-325 MG per tablet Take 1 tablet by mouth every 4 (four) hours as needed for pain. For pain    . Potassium Chloride ER 20 MEQ TBCR Take 20 mEq by mouth 2 (two) times daily. 60 tablet 0  . Rivaroxaban (XARELTO) 20 MG TABS tablet Take 1 tablet (20 mg total) by mouth daily with supper. 30 tablet 0  . senna (SENOKOT) 8.6 MG tablet Take 1 tablet by mouth at bedtime.     . sildenafil (REVATIO) 20 MG tablet Take 2 tablets (40 mg total) by mouth 3 (three) times daily. 180 tablet 0  . spironolactone (ALDACTONE) 25 MG tablet Take 0.5 tablets (12.5 mg total) by mouth daily. 30 tablet 0  . torsemide (DEMADEX) 20 MG tablet Take 3 tablets (60 mg total) by mouth daily. 270 tablet 3  . zolpidem (AMBIEN) 5 MG tablet Take 1 tablet (5 mg total) by mouth at bedtime as needed for sleep. 30 tablet 0   No current facility-administered medications for this encounter.   Facility-Administered Medications Ordered in Other Encounters  Medication Dose Route Frequency Provider  Last Rate Last Dose  . alteplase (ACTIVASE) injection 2 mg  2 mg Intracatheter Once [name], MD        Filed Vitals:   07/04/14 0934  BP: 136/58  Pulse: 72  Weight: 238 lb 4 oz (108.069 kg)  SpO2: 93%   General: NAD Wife present  Neck: Thick, JVP 7 cm. No thyromegaly or thyroid nodule.  Lungs: Clear to auscultation bilaterally with normal respiratory effort. CV: Lateral PMI.  Heart regular S1/S2, no S3/S4, no murmur.  No edema.  No carotid bruit.  Normal pedal pulses.  Abdomen: Obese, Soft, tender to palpation, obese, no hepatosplenomegaly, non-distended Skin: Intact without lesions or rashes.  Neurologic: Alert and oriented x 3.  Psych: Flat affect. Extremities: No clubbing or cyanosis.   Assessment/plan: 1. Chronic systolic CHF:  Nonischemic cardiomyopathy x years.  EF 20-25% with normal RV size and systolic function on 06/23 echo. Has St Jude ICD, narrow QRS so has not been a good CRT candidate.  RHC in 3/16 showed low cardiac output with mixed pulmonary venous and pulmonary arterial hypertension (O'Neill may be due to vascular remodeling over time in the face of persistently elevated left atrial pressure).   CPX in 4/16 showed severe functional impairment but was submaximal.  He has started on milrinone gtt in 4/16 but remained symptomatic.  He returned in 5/16 for RHC/LHC.  This showed nonobstructive CAD, severely elevated PCWP, and severe pulmonary hypertension (again, mixed PAH/PVH).  Milrinone was increased, Revatio was started, and he was diuresed.  He feels much better today and weight is down 8 lbs. He does not look volume overloaded.  NYHA class II symptoms. - We discussed LVAD workup further today. Barriers to LVAD at this point are pulmonary HTN (as above), elevated creatinine (seems to be improving with milrinone), and chronic back pain.  The back pain appears controllable with Percocent currently.  I am going to bring him back again in a month for RHC.  I will try to get  him on Revatio at home prior to that and get him on Bipap.  If PA pressures come down, then I think we may be able to proceed with LVAD.   - Continue current torsemide, 60 mg daily.  -  Continue current digoxin, Toprol XL, spironolactone, and hydralazine/Imdur (off Entresto with elevated creatinine).     - Had BMET yesterday with [facility], will get results.   2. CKD: I will get yesterday's BMET from [facility].   3. Atrial fibrillation: Paroxysmal.  He is in NSR.  He is on Xarelto and amiodarone 200 mg daily.  Recent LFTs ok.  TSH high but followed by endocrinology for known hypothyroidism.  There is a good chance that he will go back into atrial fibrillation on milrinone, but I do not think that maintenance of NSR over the long-term is going to do enough to avoid advanced therapies.  4. Obesity:  Needs to lose weight.  Asked him to cut back on his portions and try low carb diet.   5. DM2: Continue to follow with endocrinology.   6. Depression/anxiety:  Doing better now that he is seeing a psychologist.   7. OSA: Complex.  He is awaiting Bipap titration. I would like him on Bipap prior to repeat RHC in a month. I will contact Dr Halford Chessman to see if bipap titration can be expedited.  8. 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  Continue statin.  9. 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  PVR 5.  He has been diuresed, and weight is down 8 lbs.  He was started on Revatio in the hospital but we are still waiting on approval as outpatient.  Would like to see him on Revatio 40 mg tid prior to repeat RHC.  I also want him on Bipap at night prior to repeat RHC.  Need lower PA pressure prior to LVAD placement.   Followup with me in 1 month, will set up [facility] at that time.   Loralie Champagne  07/04/2014

## 2014-07-04 NOTE — Telephone Encounter (Signed)
Per pt's wife still unable to get Sildenafil, per telephone encounter in chart med was approved 5/25, called [facility] pharmacy they state med is still rejecting when they run it.  Called [facility] at [phone], med is approved but has to come from specialty pharmacy can not come from retail pharmacy.  Have completed enrollment form for [facility] specialty pharmacy and sent in, pt's wife is aware

## 2014-07-09 ENCOUNTER — Ambulatory Visit (HOSPITAL_BASED_OUTPATIENT_CLINIC_OR_DEPARTMENT_OTHER): Payer: 59 | Attending: Pulmonary Disease | Admitting: Radiology

## 2014-07-09 VITALS — Ht 70.0 in | Wt 230.0 lb

## 2014-07-10 ENCOUNTER — Telehealth: Payer: Self-pay | Admitting: Pulmonary Disease

## 2014-07-10 ENCOUNTER — Encounter: Payer: Self-pay | Admitting: Internal Medicine

## 2014-07-10 NOTE — Sleep Study (Unsigned)
07/09/2014 2250 Patient was unable to complete the study because of complaints of claustrophobia, high anxiety, tooth pain, and over all discomfort. Technologist tried multiple masks including the one that was used during patient's last study. However after being refitted with nasal pillows, nasal masks, and full face masks, patient decide that he would prefer to use the full face mask again a Resmed Airfit F-10  Size small, instead of the [employer] Pilairo Q used during the last study in April 2016. Also technologist tried several attempts to make patient comfortable including giving patient a wedge to prop up with and allowing patient to have extra time to process the mask and Bilevel due to his high anxiety, and feelings of claustrophobia. Note, after all of this patient still felt that he would not be able to complete study and decided that he would like to go home with the mask and build up his tolerance to wear the device before he would be able to complete having a Bilevel titration.  Note: Patient has been fitted with multiple masks and has even had a daytime desensitization with each time having to restart from scratch due to his feelings of claustrophobia and levels of high anxiety. Patient may need to be a one on one upon returning to allow patient time to adjust.

## 2014-07-10 NOTE — Telephone Encounter (Signed)
Received information from sleep lab that pt was not able to do titration study >> was not able to use mask.  Will have my nurse call to schedule him for ROV to discuss issues with using CPAP/BiPAP mask.

## 2014-07-11 ENCOUNTER — Encounter: Payer: Self-pay | Admitting: Cardiology

## 2014-07-12 NOTE — Telephone Encounter (Signed)
Results have been explained to patient, pt expressed understanding. Pt scheduled for OV 07/23/14 at 11:00 with VS to discuss.  Nothing further needed.

## 2014-07-16 ENCOUNTER — Encounter: Payer: Self-pay | Admitting: Cardiology

## 2014-07-16 ENCOUNTER — Telehealth (HOSPITAL_COMMUNITY): Payer: Self-pay | Admitting: *Deleted

## 2014-07-16 DIAGNOSIS — I480 Paroxysmal atrial fibrillation: Secondary | ICD-10-CM

## 2014-07-16 DIAGNOSIS — I5022 Chronic systolic (congestive) heart failure: Secondary | ICD-10-CM

## 2014-07-16 MED ORDER — TORSEMIDE 20 MG PO TABS: ORAL_TABLET | ORAL | Status: DC

## 2014-07-16 NOTE — Telephone Encounter (Signed)
-----   Message from [name], MD sent at 07/12/2014  9:15 PM EDT ----- Creatinine is higher.  Would alternate torsemide 60 mg daily with torsemide 40 mg daily.  BMET and digoxin level in 10 days.

## 2014-07-16 NOTE — Telephone Encounter (Signed)
Notes Recorded by Scarlette Calico, RN on 07/16/2014 at 3:19 PM Pt and his wife aware and will make changes, pt is getting weekly labs every Wed with [facility], will have them add dig level to next draw

## 2014-07-17 ENCOUNTER — Encounter: Payer: Self-pay | Admitting: Cardiology

## 2014-07-22 ENCOUNTER — Telehealth (HOSPITAL_COMMUNITY): Payer: Self-pay | Admitting: *Deleted

## 2014-07-22 NOTE — Telephone Encounter (Signed)
Pt's wife called concerned about pt, she states he finally received the Sildenafil on Wed and started taking it on Thursday (40 mg TID) and on Friday he started having abd pain/nausea/diarrhea.  She states these symptoms have continued all weekend and pt is not feeing well.  She states he feels ok when he first gets up but an hour or 2 after taking meds he begins to have the symptoms, diarrhea multiple times a day, small amounts each time and c/o feeling nauseated and of abd pain in the top part of his stomach.  She states his wt is stable running from 223-228, he is at 227 today, denies SOB or edema.  She is concerned symptoms are from Sildenafil, will send to Dr Aundra Dubin to review and call her back later today.

## 2014-07-22 NOTE — Telephone Encounter (Signed)
Spoke w/pt's wife, she is aware and agreeable to plan, will c/b if symptoms do not resolve

## 2014-07-22 NOTE — Telephone Encounter (Signed)
Left message to call back  

## 2014-07-22 NOTE — Telephone Encounter (Signed)
Drop sildenafil down to 20 mg tid and see how he does on lower dose.

## 2014-07-23 ENCOUNTER — Ambulatory Visit (INDEPENDENT_AMBULATORY_CARE_PROVIDER_SITE_OTHER): Payer: 59 | Admitting: Pulmonary Disease

## 2014-07-23 ENCOUNTER — Encounter: Payer: Self-pay | Admitting: Pulmonary Disease

## 2014-07-23 VITALS — BP 136/78 | HR 67 | Ht 70.0 in | Wt 230.8 lb

## 2014-07-23 DIAGNOSIS — G4737 Central sleep apnea in conditions classified elsewhere: Secondary | ICD-10-CM

## 2014-07-23 DIAGNOSIS — G4733 Obstructive sleep apnea (adult) (pediatric): Secondary | ICD-10-CM | POA: Diagnosis not present

## 2014-07-23 DIAGNOSIS — G4731 Primary central sleep apnea: Secondary | ICD-10-CM

## 2014-07-23 NOTE — Patient Instructions (Signed)
Will arrange for auto BiPAP machine set up Can look at following company websites for BiPAP masks: Resmed, [employer], [employer], [employer]  Follow up in 2 months

## 2014-07-23 NOTE — Progress Notes (Signed)
Chief Complaint  Patient presents with  . Follow-up    Discuss BiPAP mask and study.     History of Present Illness: SHA AMER is a 64 y.o. male with complex sleep apnea.  He went for BiPAP titration study.  He was not able to tolerate mask during study.  He was tried on nasal pillow, and then had chin strap added.  He felt like his jaw was getting shifted.  As a result he was not able to complete the in lab titration.  He continues to have snoring, sleep disruption, and daytime sleepiness.  He falls asleep during the day.   TESTS: Echo 05/02/13 >> EF 25 to 53%, grade 2 diastolic dysfx PSG 03/13/31 >> AHI 62.4, SaO2 low 76%.  Sup-optimal titration >> OSA/CSA with CPAP, not enough time with BiPAP.  Past medical hx >> NICM, CAD, s/p AICD, HLD, PAF, HTN, PVD, CVA, Pituitary adenoma, Depression, MUGA, CKD 3, DM with neuropathy, Hypothyroidism, Lupus anticoagulant positive  Past surgical hx, Medications, Allergies, Family hx, Social hx all reviewed.   Physical Exam: Blood pressure 136/78, pulse 67, height 5\' 10"  (1.778 m), weight 230 lb 12.8 oz (104.69 kg), SpO2 97 %. Body mass index is 33.12 kg/(m^2).  General - No distress ENT - No sinus tenderness, no oral exudate, no LAN, poor dentition Cardiac - s1s2 regular, no murmur Chest - No wheeze/rales/dullness Back - No focal tenderness Abd - Soft, non-tender Ext - No edema Neuro - Normal strength Skin - No rashes Psych - normal mood, and behavior  Discussion: He has severe sleep apnea.  During split night study in April 2016 he developed significant central apneas while using CPAP.  Plan was to proceed with in lab BiPAP titration study.  Unfortunately he was not able to tolerate mask used during study, and did not complete study.  Given severity of his systolic and diastolic heart failure, I advised that it is paramount that he get on therapy for his sleep apnea.  Assessment/Plan:  Complex sleep apnea. Plan: - will see if he  can get better set up at home with auto BiPAP range 5 to 20 cm H2O - will get download from BiPAP after he uses device for one month - have advised him to go to manufacturing web-sites to review mask options >> he can call to have order placed if he finds particular mask he would like to try  Obesity. Plan: - discussed importance of weight loss   Chesley Mires, MD [facility] Pager:  [phone]

## 2014-07-24 ENCOUNTER — Telehealth (HOSPITAL_COMMUNITY): Payer: Self-pay | Admitting: Cardiology

## 2014-07-24 NOTE — Telephone Encounter (Signed)
Called to request verbal order to continue seeing patient for [facility] services, home milrinone patient Verbal order given to continue services

## 2014-07-31 ENCOUNTER — Telehealth (HOSPITAL_COMMUNITY): Payer: Self-pay | Admitting: *Deleted

## 2014-07-31 NOTE — Telephone Encounter (Signed)
HHRN called stated pt had a 3lb weight gain overnight she drew labs today but pt is asymptomatic. Spoke with pt he said he feels "fine" and that he thinks the scale may be off.   Will wait to see what his weight is tomorrow and lab results.

## 2014-08-07 ENCOUNTER — Encounter: Payer: Self-pay | Admitting: Internal Medicine

## 2014-08-07 ENCOUNTER — Encounter (HOSPITAL_COMMUNITY): Payer: Self-pay | Admitting: *Deleted

## 2014-08-07 ENCOUNTER — Encounter (HOSPITAL_COMMUNITY): Payer: Self-pay

## 2014-08-07 ENCOUNTER — Other Ambulatory Visit (HOSPITAL_COMMUNITY): Payer: Self-pay | Admitting: Dentistry

## 2014-08-07 ENCOUNTER — Other Ambulatory Visit: Payer: Self-pay

## 2014-08-07 ENCOUNTER — Ambulatory Visit (HOSPITAL_COMMUNITY)
Admission: RE | Admit: 2014-08-07 | Discharge: 2014-08-07 | Disposition: A | Discharge status: Home / Self Care | Payer: 59 | Source: Ambulatory Visit | Attending: Cardiology | Admitting: Cardiology

## 2014-08-07 VITALS — BP 102/64 | HR 63 | Wt 227.8 lb

## 2014-08-07 DIAGNOSIS — I480 Paroxysmal atrial fibrillation: Secondary | ICD-10-CM | POA: Insufficient documentation

## 2014-08-07 DIAGNOSIS — N189 Chronic kidney disease, unspecified: Secondary | ICD-10-CM | POA: Diagnosis not present

## 2014-08-07 DIAGNOSIS — E039 Hypothyroidism, unspecified: Secondary | ICD-10-CM | POA: Diagnosis not present

## 2014-08-07 DIAGNOSIS — Z794 Long term (current) use of insulin: Secondary | ICD-10-CM | POA: Diagnosis not present

## 2014-08-07 DIAGNOSIS — G4733 Obstructive sleep apnea (adult) (pediatric): Secondary | ICD-10-CM | POA: Diagnosis not present

## 2014-08-07 DIAGNOSIS — I2510 Atherosclerotic heart disease of native coronary artery without angina pectoris: Secondary | ICD-10-CM | POA: Diagnosis not present

## 2014-08-07 DIAGNOSIS — D472 Monoclonal gammopathy: Secondary | ICD-10-CM | POA: Insufficient documentation

## 2014-08-07 DIAGNOSIS — I272 Other secondary pulmonary hypertension: Secondary | ICD-10-CM | POA: Insufficient documentation

## 2014-08-07 DIAGNOSIS — Z87891 Personal history of nicotine dependence: Secondary | ICD-10-CM | POA: Insufficient documentation

## 2014-08-07 DIAGNOSIS — I429 Cardiomyopathy, unspecified: Secondary | ICD-10-CM | POA: Diagnosis not present

## 2014-08-07 DIAGNOSIS — Z8673 Personal history of transient ischemic attack (TIA), and cerebral infarction without residual deficits: Secondary | ICD-10-CM | POA: Insufficient documentation

## 2014-08-07 DIAGNOSIS — N183 Chronic kidney disease, stage 3 unspecified: Secondary | ICD-10-CM

## 2014-08-07 DIAGNOSIS — Z79899 Other long term (current) drug therapy: Secondary | ICD-10-CM | POA: Insufficient documentation

## 2014-08-07 DIAGNOSIS — I5022 Chronic systolic (congestive) heart failure: Secondary | ICD-10-CM | POA: Diagnosis not present

## 2014-08-07 DIAGNOSIS — Z7902 Long term (current) use of antithrombotics/antiplatelets: Secondary | ICD-10-CM | POA: Insufficient documentation

## 2014-08-07 DIAGNOSIS — E119 Type 2 diabetes mellitus without complications: Secondary | ICD-10-CM | POA: Diagnosis not present

## 2014-08-07 DIAGNOSIS — I440 Atrioventricular block, first degree: Secondary | ICD-10-CM | POA: Diagnosis not present

## 2014-08-07 DIAGNOSIS — E785 Hyperlipidemia, unspecified: Secondary | ICD-10-CM | POA: Diagnosis not present

## 2014-08-07 DIAGNOSIS — Z8249 Family history of ischemic heart disease and other diseases of the circulatory system: Secondary | ICD-10-CM | POA: Insufficient documentation

## 2014-08-07 LAB — COMPREHENSIVE METABOLIC PANEL
ALBUMIN: 4.1 g/dL (ref 3.5–5.0)
ALK PHOS: 78 U/L (ref 38–126)
ALT: 30 U/L (ref 17–63)
ANION GAP: 10 (ref 5–15)
AST: 27 U/L (ref 15–41)
BUN: 41 mg/dL — AB (ref 6–20)
CO2: 26 mmol/L (ref 22–32)
Calcium: 9.4 mg/dL (ref 8.9–10.3)
Chloride: 99 mmol/L — ABNORMAL LOW (ref 101–111)
Creatinine, Ser: 2.4 mg/dL — ABNORMAL HIGH (ref 0.61–1.24)
GFR calc Af Amer: 32 mL/min — ABNORMAL LOW (ref >60)
GFR calc non Af Amer: 27 mL/min — ABNORMAL LOW (ref >60)
Glucose, Bld: 196 mg/dL — ABNORMAL HIGH (ref 65–99)
POTASSIUM: 5 mmol/L (ref 3.5–5.1)
SODIUM: 135 mmol/L (ref 135–145)
Total Bilirubin: 1 mg/dL (ref 0.3–1.2)
Total Protein: 8.6 g/dL — ABNORMAL HIGH (ref 6.5–8.1)

## 2014-08-07 LAB — DIGOXIN LEVEL: DIGOXIN LVL: 0.7 ng/mL — AB (ref 0.8–2.0)

## 2014-08-07 NOTE — Progress Notes (Signed)
VAD coordinator met with pt and wife during CHF clinic visit re: VAD evaluation.  Discussed ongoing issues for completion of evaluation:  1.  Home bipap per Dr. Halford Chessman; pt is looking at alternative mask options.  2.  Repeat RHC after starting Sildenafil.  3.  Admission at time of [facility] for dental extractions; wife states Dr. Enrique Sack says pt will need "bone reconstruction" and was told he will need "6 months to heal".    4.  Pt is "thinking about VAD" and wants to proceed if it is an option for him after completing evaluation.    Dr. Darcey Nora updated re: dental extractions and other oral surgery; he will speak with Dr. Enrique Sack about plan.  All questions answered, asked pt and wife to contact me or Janene Madeira if any questions re: VAD.

## 2014-08-07 NOTE — Progress Notes (Signed)
Patient ID: Larry [name], male   DOB: March 16, 1950, 64 y.o.   MRN: [id number] PCP: Dr. Rex Kras Endocrinologist: Dr Buddy Duty  64 yo with history of chronic systolic CHF (nonischemic cardiomyopathy), CKD, prior CVA, and paroxysmal atrial fibrillation.  Patient says that he has been known to have a low EF for years.  He has a [employer], [employer] ICD.  He had LHC in 2009 with nonobstructive disease. Last echo in 12/15 showed EF 20-25% with normal RV size and systolic function.     Patient had [place] 8/15 showing low cardiac output, mildly elevated PCWP and primarily pulmonary venous hypertension.  He was transitioned from Lasix to torsemide and started on digoxin.  Entresto was started and valsartan stopped.   Adenosine Cardiolite was done in 12/15 showing large area of apical scar but no ischemia.  This raised concern for possible ischemic cardiomyopathy.  He was admitted later in 12/15 with acute on chronic systolic CHF.  He also was noted to have gone into atrial fibrillation during this hospitalization, amiodarone was started in preparation for possible DCCV.  Troponin was mildly elevated in the hospital in the absence of chest pain, likely demand ischemia.  He was diuresed with IV Lasix and was discharged soon after.  He was cardioverted back to NSR on 02/14/14.  He was cardioverted again to NSR in 3/16.  He had RHC done 05/08/14 with mixed pulmonary hypertension and PWCP 25, CI 1.75.  CPX done 4/16 showed severe functional limitation.  In 4/16, he was started on home milrinone via PICC at 0.25 mcg/kg/min.  Co-ox increased from 55% => 72%.  Entresto stopped with elevated creatinine.   Mr Torre initially remained breathless with NYHA class III symptoms even after starting milrinone.  He came back for LHC/RHC in 5/16.  This showed nonobstructive CAD and elevated filling pressures including severe pulmonary hypertension.  CI was 1.95 by Fick.  I increased milrinone to 0.375, started him on Revatio, and diuresed him.   Today,  weight is down 11 lbs. He is stable clinically. No dyspnea walking on flat ground but tires after walking about 200 feet (stops 3 times walking 500 feet). Fatigue with inclines generally.  He can climb steps without much problem.  He has complex sleep apnea and still waiting for Bipap titration => will be soon.  No orthopnea/PND.  No chest pain. He remains in NSR today.    Corevue reviewed: Recent evidence for volume overload but appears to have resolved over the last few days.   ECG: NSR, 1st degree AVB, IVCD with QRS 166 msec  Labs (2/15): LDL 103 Labs (3/15): K 4.1, creatinine 1.5, BNP 164 Labs (4/15): K 3.5, creatinine 3.3 => 1.9 => 2.1, BNP 96 Labs (4/15): K 4.3, creatinine 1.43, glucose 308 Labs (5/15): K 5.1 Creatinine 1.35  Labs (8/15): K 5 => 4.7 => 3.8, creatinine 1.19 => 1.57 => 1.6, digoxin 1.1 Labs (9/15): K 4.6, creatinine 1.32 => 1.9 => 1.1, digoxin 0.6 => 1.2 (not trough) Labs (10/15): K 4.5, creatinine 1.36, pro-BNP 1261 Labs (12/15): TSH 8.6 (elevated), free T3/free T4 normal, K 4.5, creatinine 1.71, digoxin 0.6, HCT 44.1, AST 38, ALT 44, TSH normal Labs (1/16): K 5.1, creatinine 1.96, LFTs normal, BNP 952 Labs (3/16): K 4.6, creatinine 2.06, LFTs normal, digoxin 1.8 (decreased digoxin to 0.0625) Labs (4/16): K 3.5, creatinine 1.83, LDL 37, HDL 28, HIV negative, lupus anticoagulant +.  Labs (5/16): K 3.3, creatinine 1.8, digoxin 0.4, HCT 34 Labs (6/16): K 4.7 => 5,  creatinine 2.11 => 2.4, hgb 11.4   PMH: 1. Chronic systolic CHF: ? Nonischemic cardiomyopathy.  Most recent echo in 3/15 with EF 25-30%, moderate LV dilation, mild LVH, moderate diastolic dysfunction, IVC normal, mildly decreased RV systolic function.  Prior echo in 2011 also with EF 25-30%.  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  [employer] Jude ICD. CPX 05/2013: FVC 3.89 (82%), FEV1 2.99 (83%), FEV1/FVC 77%, Peak HR 118 (74% predicted max), Peak VO2 12.4 (51% of predicted; when corrected to IBW =17.7), VE/VCO2 29.9, OUES  1.39, Peak RER 1.21.  RHC (8/15) with mean RA 6, PA 60/21 mean 36, mean PCWP 21, CI 1.81, PVR 3.6 WU.  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  Echo (12/15) with EF 20-25%, inferior akinesis, normal RV size and systolic function.  RHC (3/16) with mean RA 7, PA 76/24 mean 43, mean PCWP 25, CI 1.75, PVR 4.62. CPX (4/16) with RER 0.99, peak VO2 7.1, slope 36 => severely decreased functional capacity though submaximal. Home milrinone at 0.25 mcg/kg/min begun 4/16, increased to 0.375 in 5/16.  LHC/RHC (5/16) with nonobstructive CAD, mean RA 17, PA 102/37 mean 60, mean PCWP 37, CI 1.95 Fick/2.59 thermo, PVR 5 WU.  2. Type II diabetes 3. CKD 4. Monoclonal gammopathy of uncertain etiology.  5. CVA 2007 6. Degenerative disc disease: Chronic low back pain.   7. Pituitary microadenoma: Nonfunctioning, s/p surgery in 2009.  8. OSA: Complex, awaiting Bipap titration. 9. Depression 10. Hypothyroidism 11. Hyperlipidemia 12. Atrial fibrillation:   Patient is on Xarelto. DCCV 02/14/14 to NSR.  13. 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  Saw Dr Lindi Adie with hematology.   SH: Married, former cigar smoker, unemployed.   FH: Father with MI x 2 and CHF, strong history of CAD on his father's side.   ROS: All systems reviewed and negative except as per HPI.   Current Outpatient Prescriptions  Medication Sig Dispense Refill  . ALPRAZolam (XANAX) 0.5 MG tablet Take 0.5 mg by mouth 3 (three) times daily as needed for anxiety or sleep.    [name] amiodarone (PACERONE) 200 MG tablet Take 1 tablet (200 mg total) by mouth daily. 30 tablet 3  . atorvastatin (LIPITOR) 40 MG tablet TAKE 1 TABLET DAILY 90 tablet 3  . digoxin (LANOXIN) 0.125 MG tablet Take 0.5 tablets (0.0625 mg total) by mouth daily. 90 tablet 3  . DULoxetine (CYMBALTA) 30 MG capsule Take 60 mg by mouth 2 (two) times daily.      [name] GLUCAGON EMERGENCY 1 MG injection Inject 1 mg as directed once. For hypotension    . hydrALAZINE (APRESOLINE) 50 MG tablet Take 1.5 tablets (75 mg total) by mouth 3 (three) times daily. 135 tablet 0  . insulin aspart (NOVOLOG) 100 UNIT/ML injection Inject 30 Units into the skin 3 (three) times daily before meals.     [name] ketorolac (ACULAR) 0.5 % ophthalmic solution Place 1 drop into the left eye 2 (two) times daily. Start 1 week prior to surgery  4  . LANTUS SOLOSTAR 100 UNIT/ML Solostar Pen Inject 32 Units into the skin at bedtime.     [name] levothyroxine (SYNTHROID, LEVOTHROID) 25 MCG tablet Take 50 mcg by mouth daily before breakfast.   5  . Melatonin 10 MG TABS Take 10 mg by mouth at bedtime.    . metoprolol succinate (TOPROL-XL) 25 MG 24 hr tablet Take 1 tablet (25 mg total) by  mouth 2 (two) times daily before a meal. 60 tablet 6  . milrinone (PRIMACOR) 20 MG/100ML SOLN infusion Inject 39.525 mcg/min into the vein continuous. (0.376mcg/kg/min)    . Multiple Vitamin (MULITIVITAMIN WITH MINERALS) TABS Take 1 tablet by mouth daily.    [name] ofloxacin (OCUFLOX) 0.3 % ophthalmic solution Place 1 drop into the right eye 2 (two) times daily. Start 72 hours prior to surgery  1  . oxyCODONE-acetaminophen (PERCOCET) 10-325 MG per tablet Take 1 tablet by mouth every 4 (four) hours as needed for pain. For pain    . Potassium Chloride ER 20 MEQ TBCR Take 20 mEq by mouth 2 (two) times daily. 60 tablet 0  . Rivaroxaban (XARELTO) 20 MG TABS tablet Take 1 tablet (20 mg total) by mouth daily with supper. 30 tablet 0  . senna (SENOKOT) 8.6 MG tablet Take 1 tablet by mouth at bedtime.     . sildenafil (REVATIO) 20 MG tablet Take 2 tablets (40 mg total) by mouth 3 (three) times daily. (Patient taking differently: Take 20 mg by mouth 3 (three) times daily. ) 180 tablet 0  . spironolactone (ALDACTONE) 25 MG tablet Take 0.5 tablets (12.5 mg total) by mouth daily. 30 tablet 0  . torsemide (DEMADEX) 20 MG tablet Take 3 tabs  every other day alternating with 2 tabs every other day 270 tablet 3  . zolpidem (AMBIEN) 5 MG tablet Take 1 tablet (5 mg total) by mouth at bedtime as needed for sleep. (Patient not taking: Reported on 08/07/2014) 30 tablet 0   No current facility-administered medications for this encounter.   Facility-Administered Medications Ordered in Other Encounters  Medication Dose Route Frequency Provider Last Rate Last Dose  . alteplase (ACTIVASE) injection 2 mg  2 mg Intracatheter Once [name], MD        Filed Vitals:   08/07/14 0850  BP: 102/64  Pulse: 63  Weight: 227 lb 12 oz (103.307 kg)  SpO2: 97%   General: NAD Wife present  Neck: Thick, no JVD. No thyromegaly or thyroid nodule.  Lungs: Clear to auscultation bilaterally with normal respiratory effort. CV: Lateral PMI.  Heart regular S1/S2, no S3/S4, no murmur.  No edema.  No carotid bruit.  Normal pedal pulses.  Abdomen: Obese, Soft, tender to palpation, obese, no hepatosplenomegaly, non-distended Skin: Intact without lesions or rashes.  Neurologic: Alert and oriented x 3.  Psych: Flat affect. Extremities: No clubbing or cyanosis.   Assessment/plan: 1. Chronic systolic CHF:  Nonischemic cardiomyopathy x years.  EF 20-25% with normal RV size and systolic function on 00/86 echo. Has St Jude ICD and has wide QRS (166 msec today) but not true LBBB pattern so suspect benefit from CRT upgrade would not be marked.  RHC in 3/16 showed low cardiac output with mixed pulmonary venous and pulmonary arterial hypertension ([place] may be due to vascular remodeling over time in the face of persistently elevated left atrial pressure).   CPX in 4/16 showed severe functional impairment but was submaximal.  He has started on milrinone gtt in 4/16 but remained symptomatic.  He returned in 5/16 for RHC/LHC.  This showed nonobstructive CAD, severely elevated PCWP, and severe pulmonary hypertension (again, mixed PAH/PVH).  Milrinone was increased, Revatio was  started, and he was diuresed.  Stable NYHA class III symptoms.  He is not volume overloaded on exam today, Corevue showed recently resolved fluid elevation.  His weight is coming down nicely.  Unfortunately, creatinine is higher today at 2.4. - Continue current  milrinone gtt 0.375.  - We discussed LVAD workup further today. Barriers to LVAD at this point are pulmonary HTN (as above), elevated creatinine (seems to be improving with milrinone), and chronic back pain.  The back pain appears controllable with Percocet currently.  I am going to arrange RHC in the next couple of weeks to be followed by tooth extractions.  If PA pressures have come down by RHC, then I think we may be able to proceed with LVAD if we can get his renal function to stabilize out.   - Decrease torsemide to 40 mg bid with elevated creatinine.   - Continue current digoxin, Toprol XL, spironolactone, and hydralazine/Imdur (off Entresto with elevated creatinine).  Check digoxin level today.    - Had BMET yesterday with [facility], will get results.   2. CKD: Creatinine unfortunately higher.  I will decrease torsemide as above and repeat BMET next week.    3. Atrial fibrillation: Paroxysmal.  He is in NSR.  He is on Xarelto and amiodarone 200 mg daily.  TSH followed by endocrinology for known hypothyroidism.  Check LFTs today.  Need to decrease Xarelto to 15 mg daily based on creatinine clearance.  There is a good chance that he will go back into atrial fibrillation on milrinone, but I do not think that maintenance of NSR over the long-term is going to do enough to avoid advanced therapies.  4. Obesity:  Weight is coming down.  5. DM2: Continue to follow with endocrinology.   6. Depression/anxiety:  Continue to see psychologist.    7. OSA: Complex.  He is awaiting Bipap titration, has seen Dr Halford Chessman.   8. 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  Continue statin.  9. 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  PVR 5.  He is now on Revatio.  He also needs to start on Bipap for OSA.  Needs lower PA pressure prior to LVAD placement. As above, will arrange repeat RHC.   Loralie Champagne  08/07/2014

## 2014-08-07 NOTE — Patient Instructions (Signed)
Routine lab work today. Will notify you of abnormal results, otherwise no news is good news!  You have been scheduled for a heart catheterization, followed by direct admission to hospital for next-day teeth extraction on Wednesday July 13th. See instruction sheet for further details.  Follow up 3 weeks.  Do the following things EVERYDAY: 1) Weigh yourself in the morning before breakfast. Write it down and keep it in a log. 2) Take your medicines as prescribed 3) Eat low salt foods-Limit salt (sodium) to 2000 mg per day.  4) Stay as active as you can everyday 5) Limit all fluids for the day to less than 2 liters

## 2014-08-10 ENCOUNTER — Other Ambulatory Visit (HOSPITAL_COMMUNITY): Payer: Self-pay | Admitting: Infectious Diseases

## 2014-08-12 ENCOUNTER — Other Ambulatory Visit: Payer: Self-pay | Admitting: Interventional Cardiology

## 2014-08-12 ENCOUNTER — Other Ambulatory Visit: Payer: Self-pay | Admitting: Physician Assistant

## 2014-08-13 ENCOUNTER — Telehealth (HOSPITAL_COMMUNITY): Payer: Self-pay | Admitting: *Deleted

## 2014-08-13 ENCOUNTER — Other Ambulatory Visit: Payer: Self-pay

## 2014-08-13 DIAGNOSIS — I480 Paroxysmal atrial fibrillation: Secondary | ICD-10-CM

## 2014-08-13 DIAGNOSIS — I5022 Chronic systolic (congestive) heart failure: Secondary | ICD-10-CM

## 2014-08-13 MED ORDER — TORSEMIDE 20 MG PO TABS: 40.0000 mg | ORAL_TABLET | Freq: Two times a day (BID) | ORAL | Status: DC

## 2014-08-13 MED ORDER — POTASSIUM CHLORIDE ER 20 MEQ PO TBCR: 20.0000 meq | EXTENDED_RELEASE_TABLET | Freq: Two times a day (BID) | ORAL | Status: DC

## 2014-08-13 MED ORDER — RIVAROXABAN 15 MG PO TABS: 15.0000 mg | ORAL_TABLET | Freq: Every day | ORAL | Status: DC

## 2014-08-13 NOTE — Telephone Encounter (Signed)
Notes Recorded by Scarlette Calico, RN on 08/13/2014 at 5:29 PM Pt's wife aware and agreeable, note sent to [facility] to check labs on 7/11

## 2014-08-13 NOTE — Telephone Encounter (Signed)
-----   Message from [name], MD sent at 08/07/2014 11:02 AM EDT ----- Decrease torsemide to 40 bid, decrease Xarelto to 15 mg daily.  Repeat BMET 1 week.

## 2014-08-14 ENCOUNTER — Telehealth (HOSPITAL_COMMUNITY): Payer: Self-pay | Admitting: Cardiology

## 2014-08-14 ENCOUNTER — Other Ambulatory Visit: Payer: Self-pay | Admitting: Physician Assistant

## 2014-08-14 NOTE — Telephone Encounter (Signed)
Pt scheduled for RHC per Dr.McLean on 08/21/14 Cpt code 93453 With pts current insurance- no pre cert required

## 2014-08-15 ENCOUNTER — Other Ambulatory Visit: Payer: Self-pay

## 2014-08-15 MED ORDER — POTASSIUM CHLORIDE ER 20 MEQ PO TBCR: 20.0000 meq | EXTENDED_RELEASE_TABLET | Freq: Two times a day (BID) | ORAL | Status: DC

## 2014-08-20 ENCOUNTER — Telehealth (HOSPITAL_COMMUNITY): Payer: Self-pay | Admitting: *Deleted

## 2014-08-20 MED ORDER — SILDENAFIL CITRATE 20 MG PO TABS: 20.0000 mg | ORAL_TABLET | Freq: Three times a day (TID) | ORAL | Status: DC

## 2014-08-20 NOTE — Telephone Encounter (Signed)
[facility] called to clarify pt's rx for Sildenafil, she states the rx they have is for 2 tabs Three times a day but the pt told them he was decreased to 1 tab Three times a day, per review of the chart med was decreased on 6/13 by Dr Aundra Dubin, new order given

## 2014-08-21 ENCOUNTER — Other Ambulatory Visit: Payer: Self-pay

## 2014-08-21 ENCOUNTER — Encounter (HOSPITAL_COMMUNITY)
Admission: RE | Disposition: A | Discharge status: Home Health | Payer: 59 | Source: Ambulatory Visit | Attending: Cardiology

## 2014-08-21 ENCOUNTER — Encounter: Payer: Self-pay | Admitting: Cardiology

## 2014-08-21 ENCOUNTER — Inpatient Hospital Stay (HOSPITAL_COMMUNITY)
Admission: RE | Admit: 2014-08-21 | Discharge: 2014-08-24 | DRG: 129 | Disposition: A | Discharge status: Home Health | Payer: Commercial Managed Care - HMO | Source: Ambulatory Visit | Attending: Cardiology | Admitting: Cardiology

## 2014-08-21 DIAGNOSIS — Z01811 Encounter for preprocedural respiratory examination: Secondary | ICD-10-CM

## 2014-08-21 DIAGNOSIS — D472 Monoclonal gammopathy: Secondary | ICD-10-CM | POA: Diagnosis present

## 2014-08-21 DIAGNOSIS — I129 Hypertensive chronic kidney disease with stage 1 through stage 4 chronic kidney disease, or unspecified chronic kidney disease: Secondary | ICD-10-CM | POA: Diagnosis present

## 2014-08-21 DIAGNOSIS — G4733 Obstructive sleep apnea (adult) (pediatric): Secondary | ICD-10-CM | POA: Diagnosis present

## 2014-08-21 DIAGNOSIS — Z8673 Personal history of transient ischemic attack (TIA), and cerebral infarction without residual deficits: Secondary | ICD-10-CM | POA: Diagnosis not present

## 2014-08-21 DIAGNOSIS — I272 Other secondary pulmonary hypertension: Secondary | ICD-10-CM | POA: Diagnosis present

## 2014-08-21 DIAGNOSIS — E669 Obesity, unspecified: Secondary | ICD-10-CM | POA: Diagnosis present

## 2014-08-21 DIAGNOSIS — I480 Paroxysmal atrial fibrillation: Secondary | ICD-10-CM | POA: Diagnosis present

## 2014-08-21 DIAGNOSIS — Z7902 Long term (current) use of antithrombotics/antiplatelets: Secondary | ICD-10-CM

## 2014-08-21 DIAGNOSIS — J9610 Chronic respiratory failure, unspecified whether with hypoxia or hypercapnia: Secondary | ICD-10-CM | POA: Diagnosis present

## 2014-08-21 DIAGNOSIS — Z87891 Personal history of nicotine dependence: Secondary | ICD-10-CM | POA: Diagnosis not present

## 2014-08-21 DIAGNOSIS — K045 Chronic apical periodontitis: Secondary | ICD-10-CM | POA: Diagnosis present

## 2014-08-21 DIAGNOSIS — E780 Pure hypercholesterolemia: Secondary | ICD-10-CM | POA: Diagnosis present

## 2014-08-21 DIAGNOSIS — Z6832 Body mass index (BMI) 32.0-32.9, adult: Secondary | ICD-10-CM | POA: Diagnosis not present

## 2014-08-21 DIAGNOSIS — I5023 Acute on chronic systolic (congestive) heart failure: Secondary | ICD-10-CM | POA: Diagnosis not present

## 2014-08-21 DIAGNOSIS — E1149 Type 2 diabetes mellitus with other diabetic neurological complication: Secondary | ICD-10-CM | POA: Diagnosis present

## 2014-08-21 DIAGNOSIS — N183 Chronic kidney disease, stage 3 (moderate): Secondary | ICD-10-CM | POA: Diagnosis present

## 2014-08-21 DIAGNOSIS — F4310 Post-traumatic stress disorder, unspecified: Secondary | ICD-10-CM | POA: Diagnosis present

## 2014-08-21 DIAGNOSIS — Z95810 Presence of automatic (implantable) cardiac defibrillator: Secondary | ICD-10-CM | POA: Diagnosis not present

## 2014-08-21 DIAGNOSIS — E230 Hypopituitarism: Secondary | ICD-10-CM | POA: Diagnosis present

## 2014-08-21 DIAGNOSIS — E11329 Type 2 diabetes mellitus with mild nonproliferative diabetic retinopathy without macular edema: Secondary | ICD-10-CM | POA: Diagnosis present

## 2014-08-21 DIAGNOSIS — I5022 Chronic systolic (congestive) heart failure: Secondary | ICD-10-CM | POA: Diagnosis present

## 2014-08-21 DIAGNOSIS — Z794 Long term (current) use of insulin: Secondary | ICD-10-CM

## 2014-08-21 DIAGNOSIS — K083 Retained dental root: Secondary | ICD-10-CM | POA: Diagnosis present

## 2014-08-21 DIAGNOSIS — I2510 Atherosclerotic heart disease of native coronary artery without angina pectoris: Secondary | ICD-10-CM | POA: Diagnosis present

## 2014-08-21 DIAGNOSIS — Z8249 Family history of ischemic heart disease and other diseases of the circulatory system: Secondary | ICD-10-CM

## 2014-08-21 DIAGNOSIS — I428 Other cardiomyopathies: Secondary | ICD-10-CM | POA: Diagnosis present

## 2014-08-21 DIAGNOSIS — IMO0002 Reserved for concepts with insufficient information to code with codable children: Secondary | ICD-10-CM

## 2014-08-21 DIAGNOSIS — F329 Major depressive disorder, single episode, unspecified: Secondary | ICD-10-CM | POA: Diagnosis present

## 2014-08-21 DIAGNOSIS — E1142 Type 2 diabetes mellitus with diabetic polyneuropathy: Secondary | ICD-10-CM | POA: Diagnosis present

## 2014-08-21 DIAGNOSIS — E039 Hypothyroidism, unspecified: Secondary | ICD-10-CM | POA: Diagnosis present

## 2014-08-21 DIAGNOSIS — I509 Heart failure, unspecified: Secondary | ICD-10-CM

## 2014-08-21 DIAGNOSIS — I270 Primary pulmonary hypertension: Secondary | ICD-10-CM

## 2014-08-21 DIAGNOSIS — F419 Anxiety disorder, unspecified: Secondary | ICD-10-CM | POA: Diagnosis present

## 2014-08-21 DIAGNOSIS — K029 Dental caries, unspecified: Secondary | ICD-10-CM | POA: Diagnosis present

## 2014-08-21 DIAGNOSIS — E1151 Type 2 diabetes mellitus with diabetic peripheral angiopathy without gangrene: Secondary | ICD-10-CM | POA: Diagnosis present

## 2014-08-21 DIAGNOSIS — M278 Other specified diseases of jaws: Secondary | ICD-10-CM | POA: Diagnosis present

## 2014-08-21 DIAGNOSIS — M5136 Other intervertebral disc degeneration, lumbar region: Secondary | ICD-10-CM | POA: Diagnosis present

## 2014-08-21 HISTORY — DX: Chronic respiratory failure, unspecified whether with hypoxia or hypercapnia: J96.10

## 2014-08-21 HISTORY — PX: CARDIAC CATHETERIZATION: SHX172

## 2014-08-21 LAB — POCT I-STAT 3, VENOUS BLOOD GAS (G3P V)
ACID-BASE DEFICIT: 2 mmol/L (ref 0.0–2.0)
ACID-BASE DEFICIT: 2 mmol/L (ref 0.0–2.0)
Bicarbonate: 23 mEq/L (ref 20.0–24.0)
Bicarbonate: 23.6 mEq/L (ref 20.0–24.0)
O2 SAT: 53 %
O2 SAT: 58 %
PO2 VEN: 32 mmHg (ref 30.0–45.0)
TCO2: 24 mmol/L (ref 0–100)
TCO2: 25 mmol/L (ref 0–100)
pCO2, Ven: 41.4 mmHg — ABNORMAL LOW (ref 45.0–50.0)
pCO2, Ven: 42.1 mmHg — ABNORMAL LOW (ref 45.0–50.0)
pH, Ven: 7.352 — ABNORMAL HIGH (ref 7.250–7.300)
pH, Ven: 7.358 — ABNORMAL HIGH (ref 7.250–7.300)
pO2, Ven: 30 mmHg (ref 30.0–45.0)

## 2014-08-21 LAB — BASIC METABOLIC PANEL
Anion gap: 9 (ref 5–15)
BUN: 39 mg/dL — ABNORMAL HIGH (ref 6–20)
CO2: 24 mmol/L (ref 22–32)
Calcium: 8.9 mg/dL (ref 8.9–10.3)
Chloride: 99 mmol/L — ABNORMAL LOW (ref 101–111)
Creatinine, Ser: 2.1 mg/dL — ABNORMAL HIGH (ref 0.61–1.24)
GFR calc Af Amer: 37 mL/min — ABNORMAL LOW (ref >60)
GFR, EST NON AFRICAN AMERICAN: 32 mL/min — AB (ref >60)
GLUCOSE: 247 mg/dL — AB (ref 65–99)
Potassium: 4 mmol/L (ref 3.5–5.1)
SODIUM: 132 mmol/L — AB (ref 135–145)

## 2014-08-21 LAB — GLUCOSE, CAPILLARY
GLUCOSE-CAPILLARY: 166 mg/dL — AB (ref 65–99)
GLUCOSE-CAPILLARY: 221 mg/dL — AB (ref 65–99)
Glucose-Capillary: 197 mg/dL — ABNORMAL HIGH (ref 65–99)

## 2014-08-21 LAB — MRSA PCR SCREENING: MRSA by PCR: NEGATIVE

## 2014-08-21 SURGERY — RIGHT HEART CATH

## 2014-08-21 MED ORDER — ZOLPIDEM TARTRATE 5 MG PO TABS
5.0000 mg | ORAL_TABLET | Freq: Every evening | ORAL | Status: DC | PRN
Start: 2014-08-21 — End: 2014-08-24
  Administered 2014-08-21 – 2014-08-22 (×2): 5 mg via ORAL
  Filled 2014-08-21 (×2): qty 1

## 2014-08-21 MED ORDER — HEPARIN (PORCINE) IN NACL 2-0.9 UNIT/ML-% IJ SOLN
INTRAMUSCULAR | Status: AC
  Filled 2014-08-21: qty 500

## 2014-08-21 MED ORDER — MIDAZOLAM HCL 2 MG/2ML IJ SOLN
INTRAMUSCULAR | Status: AC
  Filled 2014-08-21: qty 2

## 2014-08-21 MED ORDER — ALTEPLASE 2 MG IJ SOLR
2.0000 mg | Freq: Once | INTRAMUSCULAR | Status: AC
  Administered 2014-08-22: 2 mg
  Filled 2014-08-21: qty 2

## 2014-08-21 MED ORDER — INSULIN ASPART 100 UNIT/ML ~~LOC~~ SOLN
30.0000 [IU] | Freq: Three times a day (TID) | SUBCUTANEOUS | Status: DC
  Administered 2014-08-22 – 2014-08-24 (×3): 30 [IU] via SUBCUTANEOUS

## 2014-08-21 MED ORDER — SPIRONOLACTONE 25 MG PO TABS
25.0000 mg | ORAL_TABLET | Freq: Every day | ORAL | Status: DC
  Administered 2014-08-22 – 2014-08-24 (×3): 25 mg via ORAL
  Filled 2014-08-21 (×3): qty 1

## 2014-08-21 MED ORDER — SODIUM CHLORIDE 0.9 % IV SOLN: INTRAVENOUS | Status: DC

## 2014-08-21 MED ORDER — LIDOCAINE HCL (PF) 1 % IJ SOLN
INTRAMUSCULAR | Status: DC | PRN
  Administered 2014-08-21: 30 mL via SUBCUTANEOUS

## 2014-08-21 MED ORDER — MIDAZOLAM HCL 2 MG/2ML IJ SOLN
INTRAMUSCULAR | Status: DC | PRN
  Administered 2014-08-21: 1 mg via INTRAVENOUS

## 2014-08-21 MED ORDER — OXYCODONE HCL 5 MG PO TABS
5.0000 mg | ORAL_TABLET | ORAL | Status: DC | PRN
  Administered 2014-08-21 – 2014-08-24 (×10): 5 mg via ORAL
  Filled 2014-08-21 (×9): qty 1

## 2014-08-21 MED ORDER — FENTANYL CITRATE (PF) 100 MCG/2ML IJ SOLN
INTRAMUSCULAR | Status: DC | PRN
  Administered 2014-08-21: 25 ug via INTRAVENOUS

## 2014-08-21 MED ORDER — FUROSEMIDE 10 MG/ML IJ SOLN
INTRAMUSCULAR | Status: AC
  Filled 2014-08-21: qty 8

## 2014-08-21 MED ORDER — SODIUM CHLORIDE 0.9 % IJ SOLN
3.0000 mL | Freq: Two times a day (BID) | INTRAMUSCULAR | Status: DC
  Administered 2014-08-21: 3 mL via INTRAVENOUS

## 2014-08-21 MED ORDER — HYDRALAZINE HCL 50 MG PO TABS
75.0000 mg | ORAL_TABLET | Freq: Three times a day (TID) | ORAL | Status: DC
  Administered 2014-08-21 – 2014-08-22 (×3): 75 mg via ORAL
  Filled 2014-08-21 (×7): qty 1

## 2014-08-21 MED ORDER — ATORVASTATIN CALCIUM 40 MG PO TABS
40.0000 mg | ORAL_TABLET | Freq: Every day | ORAL | Status: DC
  Administered 2014-08-22 – 2014-08-24 (×2): 40 mg via ORAL
  Filled 2014-08-21 (×3): qty 1

## 2014-08-21 MED ORDER — ONDANSETRON HCL 4 MG/2ML IJ SOLN: 4.0000 mg | Freq: Four times a day (QID) | INTRAMUSCULAR | Status: DC | PRN

## 2014-08-21 MED ORDER — SODIUM CHLORIDE 0.9 % IJ SOLN: 3.0000 mL | Freq: Two times a day (BID) | INTRAMUSCULAR | Status: DC

## 2014-08-21 MED ORDER — ACETAMINOPHEN 325 MG PO TABS: 650.0000 mg | ORAL_TABLET | ORAL | Status: DC | PRN

## 2014-08-21 MED ORDER — SODIUM CHLORIDE 0.9 % IV SOLN: 250.0000 mL | INTRAVENOUS | Status: DC | PRN

## 2014-08-21 MED ORDER — LEVOTHYROXINE SODIUM 75 MCG PO TABS
75.0000 ug | ORAL_TABLET | Freq: Every day | ORAL | Status: DC
  Administered 2014-08-22 – 2014-08-24 (×3): 75 ug via ORAL
  Filled 2014-08-21 (×5): qty 1

## 2014-08-21 MED ORDER — ADULT MULTIVITAMIN W/MINERALS CH
1.0000 | ORAL_TABLET | Freq: Every day | ORAL | Status: DC
  Administered 2014-08-23: 1 via ORAL
  Filled 2014-08-21 (×3): qty 1

## 2014-08-21 MED ORDER — FENTANYL CITRATE (PF) 100 MCG/2ML IJ SOLN
25.0000 ug | Freq: Once | INTRAMUSCULAR | Status: AC
  Administered 2014-08-21: 25 ug via INTRAVENOUS

## 2014-08-21 MED ORDER — FENTANYL CITRATE (PF) 100 MCG/2ML IJ SOLN
INTRAMUSCULAR | Status: AC
  Filled 2014-08-21: qty 2

## 2014-08-21 MED ORDER — SODIUM CHLORIDE 0.9 % IJ SOLN: 3.0000 mL | INTRAMUSCULAR | Status: DC | PRN

## 2014-08-21 MED ORDER — SPIRONOLACTONE 25 MG PO TABS
25.0000 mg | ORAL_TABLET | Freq: Every day | ORAL | Status: DC
  Filled 2014-08-21: qty 1

## 2014-08-21 MED ORDER — DEXTROSE 5 % IV SOLN
3.0000 g | INTRAVENOUS | Status: DC
  Filled 2014-08-21: qty 3000

## 2014-08-21 MED ORDER — SODIUM CHLORIDE 0.9 % IJ SOLN
10.0000 mL | Freq: Two times a day (BID) | INTRAMUSCULAR | Status: DC
  Administered 2014-08-22 – 2014-08-23 (×2): 10 mL

## 2014-08-21 MED ORDER — INSULIN GLARGINE 100 UNIT/ML ~~LOC~~ SOLN
32.0000 [IU] | Freq: Every day | SUBCUTANEOUS | Status: DC
  Administered 2014-08-21 – 2014-08-23 (×3): 32 [IU] via SUBCUTANEOUS
  Filled 2014-08-21 (×4): qty 0.32

## 2014-08-21 MED ORDER — DIGOXIN 0.0625 MG HALF TABLET
0.0625 mg | ORAL_TABLET | Freq: Every day | ORAL | Status: DC
  Administered 2014-08-22 – 2014-08-24 (×3): 0.0625 mg via ORAL
  Filled 2014-08-21 (×3): qty 1

## 2014-08-21 MED ORDER — METOPROLOL SUCCINATE ER 25 MG PO TB24
25.0000 mg | ORAL_TABLET | Freq: Two times a day (BID) | ORAL | Status: DC
  Administered 2014-08-22 – 2014-08-24 (×5): 25 mg via ORAL
  Filled 2014-08-21 (×7): qty 1

## 2014-08-21 MED ORDER — SILDENAFIL CITRATE 20 MG PO TABS
40.0000 mg | ORAL_TABLET | Freq: Three times a day (TID) | ORAL | Status: DC
  Administered 2014-08-21: 40 mg via ORAL
  Filled 2014-08-21 (×4): qty 2

## 2014-08-21 MED ORDER — SENNA 8.6 MG PO TABS
1.0000 | ORAL_TABLET | Freq: Every day | ORAL | Status: DC
  Administered 2014-08-21 – 2014-08-23 (×2): 8.6 mg via ORAL
  Filled 2014-08-21 (×4): qty 1

## 2014-08-21 MED ORDER — POTASSIUM CHLORIDE ER 10 MEQ PO TBCR
20.0000 meq | EXTENDED_RELEASE_TABLET | Freq: Two times a day (BID) | ORAL | Status: DC
  Administered 2014-08-21 – 2014-08-24 (×6): 20 meq via ORAL
  Filled 2014-08-21 (×10): qty 2

## 2014-08-21 MED ORDER — OXYCODONE-ACETAMINOPHEN 5-325 MG PO TABS
1.0000 | ORAL_TABLET | ORAL | Status: DC | PRN
  Administered 2014-08-21 – 2014-08-24 (×10): 1 via ORAL
  Filled 2014-08-21 (×10): qty 1

## 2014-08-21 MED ORDER — SODIUM CHLORIDE 0.9 % IJ SOLN
3.0000 mL | Freq: Two times a day (BID) | INTRAMUSCULAR | Status: DC
  Administered 2014-08-21 – 2014-08-23 (×2): 3 mL via INTRAVENOUS

## 2014-08-21 MED ORDER — MILRINONE IN DEXTROSE 20 MG/100ML IV SOLN
0.5000 ug/kg/min | INTRAVENOUS | Status: DC
  Administered 2014-08-21 – 2014-08-22 (×3): 0.5 ug/kg/min via INTRAVENOUS
  Administered 2014-08-22: 08:00:00 via INTRAVENOUS
  Administered 2014-08-22 – 2014-08-24 (×6): 0.5 ug/kg/min via INTRAVENOUS
  Filled 2014-08-21 (×11): qty 100

## 2014-08-21 MED ORDER — INSULIN ASPART 100 UNIT/ML ~~LOC~~ SOLN: 0.0000 [IU] | SUBCUTANEOUS | Status: DC

## 2014-08-21 MED ORDER — MELATONIN 10 MG PO TABS: 10.0000 mg | ORAL_TABLET | Freq: Every day | ORAL | Status: DC

## 2014-08-21 MED ORDER — LIDOCAINE HCL (PF) 1 % IJ SOLN
INTRAMUSCULAR | Status: AC
  Filled 2014-08-21: qty 30

## 2014-08-21 MED ORDER — OXYCODONE-ACETAMINOPHEN 10-325 MG PO TABS: 1.0000 | ORAL_TABLET | ORAL | Status: DC | PRN

## 2014-08-21 MED ORDER — SODIUM CHLORIDE 0.9 % IJ SOLN
10.0000 mL | INTRAMUSCULAR | Status: DC | PRN
  Administered 2014-08-22: 10 mL
  Filled 2014-08-21: qty 40

## 2014-08-21 MED ORDER — ALPRAZOLAM 0.5 MG PO TABS
0.5000 mg | ORAL_TABLET | Freq: Three times a day (TID) | ORAL | Status: DC | PRN
  Administered 2014-08-23: 0.5 mg via ORAL
  Filled 2014-08-21: qty 1

## 2014-08-21 MED ORDER — SODIUM CHLORIDE 0.9 % IJ SOLN
3.0000 mL | Freq: Two times a day (BID) | INTRAMUSCULAR | Status: DC
Start: 2014-08-21 — End: 2014-08-22
  Administered 2014-08-21 – 2014-08-22 (×2): 3 mL via INTRAVENOUS

## 2014-08-21 MED ORDER — SODIUM CHLORIDE 0.9 % IJ SOLN
3.0000 mL | INTRAMUSCULAR | Status: DC | PRN
Start: 2014-08-21 — End: 2014-08-24

## 2014-08-21 MED ORDER — SODIUM CHLORIDE 0.9 % IJ SOLN
3.0000 mL | INTRAMUSCULAR | Status: DC | PRN
  Administered 2014-08-21: 3 mL via INTRAVENOUS
  Filled 2014-08-21: qty 3

## 2014-08-21 MED ORDER — DULOXETINE HCL 60 MG PO CPEP
60.0000 mg | ORAL_CAPSULE | Freq: Two times a day (BID) | ORAL | Status: DC
  Administered 2014-08-21 – 2014-08-24 (×6): 60 mg via ORAL
  Filled 2014-08-21 (×8): qty 1

## 2014-08-21 MED ORDER — AMIODARONE HCL 200 MG PO TABS
200.0000 mg | ORAL_TABLET | Freq: Every day | ORAL | Status: DC
  Administered 2014-08-22 – 2014-08-24 (×3): 200 mg via ORAL
  Filled 2014-08-21 (×3): qty 1

## 2014-08-21 MED ORDER — FUROSEMIDE 10 MG/ML IJ SOLN
80.0000 mg | Freq: Two times a day (BID) | INTRAMUSCULAR | Status: DC
  Administered 2014-08-21 – 2014-08-22 (×3): 80 mg via INTRAVENOUS
  Filled 2014-08-21 (×4): qty 8

## 2014-08-21 SURGICAL SUPPLY — 11 items
CATH SWAN GANZ 7F STRAIGHT (CATHETERS) ×3 IMPLANT
COVER PRB 48X5XTLSCP FOLD TPE (BAG) ×1 IMPLANT
COVER PROBE 5X48 (BAG) ×2
HOVERMATT SINGLE USE (MISCELLANEOUS) ×3 IMPLANT
KIT HEART LEFT (KITS) ×3 IMPLANT
PACK CARDIAC CATHETERIZATION (CUSTOM PROCEDURE TRAY) ×3 IMPLANT
PROTECTION STATION PRESSURIZED (MISCELLANEOUS) ×3
SHEATH PINNACLE 7F 10CM (SHEATH) ×3 IMPLANT
STATION PROTECTION PRESSURIZED (MISCELLANEOUS) ×1 IMPLANT
TRANSDUCER W/STOPCOCK (MISCELLANEOUS) ×3 IMPLANT
WIRE EMERALD 3MM-J .035X150CM (WIRE) ×3 IMPLANT

## 2014-08-21 NOTE — Interval H&P Note (Signed)
History and Physical Interval Note:  08/21/2014 4:04 PM  Larry [name]  has presented today for surgery, with the diagnosis of hf  The various methods of treatment have been discussed with the patient and family. After consideration of risks, benefits and other options for treatment, the patient has consented to  Procedure(s): Right Heart Cath (N/A) as a surgical intervention .  The patient's history has been reviewed, patient examined, no change in status, stable for surgery.  I have reviewed the patient's chart and labs.  Questions were answered to the patient's satisfaction.     [name] [employer]

## 2014-08-21 NOTE — H&P (View-Only) (Signed)
Patient ID: FARLEY CROOKER, male   DOB: [birth year], 64 y.o.   MRN: [id number] PCP: Dr. Rex Kras Endocrinologist: Dr Buddy Duty  64 yo with history of chronic systolic CHF (nonischemic cardiomyopathy), CKD, prior CVA, and paroxysmal atrial fibrillation.  Patient says that he has been known to have a low EF for years.  He has a [employer], [employer] ICD.  He had LHC in 2009 with nonobstructive disease. Last echo in 12/15 showed EF 20-25% with normal RV size and systolic function.     Patient had [place] 8/15 showing low cardiac output, mildly elevated PCWP and primarily pulmonary venous hypertension.  He was transitioned from Lasix to torsemide and started on digoxin.  Entresto was started and valsartan stopped.   Adenosine Cardiolite was done in 12/15 showing large area of apical scar but no ischemia.  This raised concern for possible ischemic cardiomyopathy.  He was admitted later in 12/15 with acute on chronic systolic CHF.  He also was noted to have gone into atrial fibrillation during this hospitalization, amiodarone was started in preparation for possible DCCV.  Troponin was mildly elevated in the hospital in the absence of chest pain, likely demand ischemia.  He was diuresed with IV Lasix and was discharged soon after.  He was cardioverted back to NSR on 02/14/14.  He was cardioverted again to NSR in 3/16.  He had RHC done 05/08/14 with mixed pulmonary hypertension and PWCP 25, CI 1.75.  CPX done 4/16 showed severe functional limitation.  In 4/16, he was started on home milrinone via PICC at 0.25 mcg/kg/min.  Co-ox increased from 55% => 72%.  Entresto stopped with elevated creatinine.   Mr Grieshop initially remained breathless with NYHA class III symptoms even after starting milrinone.  He came back for LHC/RHC in 5/16.  This showed nonobstructive CAD and elevated filling pressures including severe pulmonary hypertension.  CI was 1.95 by Fick.  I increased milrinone to 0.375, started him on Revatio, and diuresed him.   Today,  weight is down 11 lbs. He is stable clinically. No dyspnea walking on flat ground but tires after walking about 200 feet (stops 3 times walking 500 feet). Fatigue with inclines generally.  He can climb steps without much problem.  He has complex sleep apnea and still waiting for Bipap titration => will be soon.  No orthopnea/PND.  No chest pain. He remains in NSR today.    Corevue reviewed: Recent evidence for volume overload but appears to have resolved over the last few days.   ECG: NSR, 1st degree AVB, IVCD with QRS 166 msec  Labs (2/15): LDL 103 Labs (3/15): K 4.1, creatinine 1.5, BNP 164 Labs (4/15): K 3.5, creatinine 3.3 => 1.9 => 2.1, BNP 96 Labs (4/15): K 4.3, creatinine 1.43, glucose 308 Labs (5/15): K 5.1 Creatinine 1.35  Labs (8/15): K 5 => 4.7 => 3.8, creatinine 1.19 => 1.57 => 1.6, digoxin 1.1 Labs (9/15): K 4.6, creatinine 1.32 => 1.9 => 1.1, digoxin 0.6 => 1.2 (not trough) Labs (10/15): K 4.5, creatinine 1.36, pro-BNP 1261 Labs (12/15): TSH 8.6 (elevated), free T3/free T4 normal, K 4.5, creatinine 1.71, digoxin 0.6, HCT 44.1, AST 38, ALT 44, TSH normal Labs (1/16): K 5.1, creatinine 1.96, LFTs normal, BNP 952 Labs (3/16): K 4.6, creatinine 2.06, LFTs normal, digoxin 1.8 (decreased digoxin to 0.0625) Labs (4/16): K 3.5, creatinine 1.83, LDL 37, HDL 28, HIV negative, lupus anticoagulant +.  Labs (5/16): K 3.3, creatinine 1.8, digoxin 0.4, HCT 34 Labs (6/16): K 4.7 => 5,  creatinine 2.11 => 2.4, hgb 11.4   PMH: 1. Chronic systolic CHF: ? Nonischemic cardiomyopathy.  Most recent echo in 3/15 with EF 25-30%, moderate LV dilation, mild LVH, moderate diastolic dysfunction, IVC normal, mildly decreased RV systolic function.  Prior echo in 2011 also with EF 25-30%.  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  St Jude ICD. CPX 05/2013: FVC 3.89 (82%), FEV1 2.99 (83%), FEV1/FVC 77%, Peak HR 118 (74% predicted max), Peak VO2 12.4 (51% of predicted; when corrected to IBW =17.7), VE/VCO2 29.9, OUES  1.39, Peak RER 1.21.  RHC (8/15) with mean RA 6, PA 60/21 mean 36, mean PCWP 21, CI 1.81, PVR 3.6 WU.  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  Echo (12/15) with EF 20-25%, inferior akinesis, normal RV size and systolic function.  RHC (3/16) with mean RA 7, PA 76/24 mean 43, mean PCWP 25, CI 1.75, PVR 4.62. CPX (4/16) with RER 0.99, peak VO2 7.1, slope 36 => severely decreased functional capacity though submaximal. Home milrinone at 0.25 mcg/kg/min begun 4/16, increased to 0.375 in 5/16.  LHC/RHC (5/16) with nonobstructive CAD, mean RA 17, PA 102/37 mean 60, mean PCWP 37, CI 1.95 Fick/2.59 thermo, PVR 5 WU.  2. Type II diabetes 3. CKD 4. Monoclonal gammopathy of uncertain etiology.  5. CVA 2007 6. Degenerative disc disease: Chronic low back pain.   7. Pituitary microadenoma: Nonfunctioning, s/p surgery in 2009.  8. OSA: Complex, awaiting Bipap titration. 9. Depression 10. Hypothyroidism 11. Hyperlipidemia 12. Atrial fibrillation:   Patient is on Xarelto. DCCV 02/14/14 to NSR.  13. 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  Saw Dr Lindi Adie with hematology.   SH: Married, former cigar smoker, unemployed.   FH: Father with MI x 2 and CHF, strong history of CAD on his father's side.   ROS: All systems reviewed and negative except as per HPI.   Current Outpatient Prescriptions  Medication Sig Dispense Refill  . ALPRAZolam (XANAX) 0.5 MG tablet Take 0.5 mg by mouth 3 (three) times daily as needed for anxiety or sleep.    [name] amiodarone (PACERONE) 200 MG tablet Take 1 tablet (200 mg total) by mouth daily. 30 tablet 3  . atorvastatin (LIPITOR) 40 MG tablet TAKE 1 TABLET DAILY 90 tablet 3  . digoxin (LANOXIN) 0.125 MG tablet Take 0.5 tablets (0.0625 mg total) by mouth daily. 90 tablet 3  . DULoxetine (CYMBALTA) 30 MG capsule Take 60 mg by mouth 2 (two) times daily.      [name] GLUCAGON EMERGENCY 1 MG injection Inject 1 mg as directed once. For hypotension    . hydrALAZINE (APRESOLINE) 50 MG tablet Take 1.5 tablets (75 mg total) by mouth 3 (three) times daily. 135 tablet 0  . insulin aspart (NOVOLOG) 100 UNIT/ML injection Inject 30 Units into the skin 3 (three) times daily before meals.     [name] ketorolac (ACULAR) 0.5 % ophthalmic solution Place 1 drop into the left eye 2 (two) times daily. Start 1 week prior to surgery  4  . LANTUS SOLOSTAR 100 UNIT/ML Solostar Pen Inject 32 Units into the skin at bedtime.     [name] levothyroxine (SYNTHROID, LEVOTHROID) 25 MCG tablet Take 50 mcg by mouth daily before breakfast.   5  . Melatonin 10 MG TABS Take 10 mg by mouth at bedtime.    . metoprolol succinate (TOPROL-XL) 25 MG 24 hr tablet Take 1 tablet (25 mg total) by  mouth 2 (two) times daily before a meal. 60 tablet 6  . milrinone (PRIMACOR) 20 MG/100ML SOLN infusion Inject 39.525 mcg/min into the vein continuous. (0.358mcg/kg/min)    . Multiple Vitamin (MULITIVITAMIN WITH MINERALS) TABS Take 1 tablet by mouth daily.    [name] ofloxacin (OCUFLOX) 0.3 % ophthalmic solution Place 1 drop into the right eye 2 (two) times daily. Start 72 hours prior to surgery  1  . oxyCODONE-acetaminophen (PERCOCET) 10-325 MG per tablet Take 1 tablet by mouth every 4 (four) hours as needed for pain. For pain    . Potassium Chloride ER 20 MEQ TBCR Take 20 mEq by mouth 2 (two) times daily. 60 tablet 0  . Rivaroxaban (XARELTO) 20 MG TABS tablet Take 1 tablet (20 mg total) by mouth daily with supper. 30 tablet 0  . senna (SENOKOT) 8.6 MG tablet Take 1 tablet by mouth at bedtime.     . sildenafil (REVATIO) 20 MG tablet Take 2 tablets (40 mg total) by mouth 3 (three) times daily. (Patient taking differently: Take 20 mg by mouth 3 (three) times daily. ) 180 tablet 0  . spironolactone (ALDACTONE) 25 MG tablet Take 0.5 tablets (12.5 mg total) by mouth daily. 30 tablet 0  . torsemide (DEMADEX) 20 MG tablet Take 3 tabs  every other day alternating with 2 tabs every other day 270 tablet 3  . zolpidem (AMBIEN) 5 MG tablet Take 1 tablet (5 mg total) by mouth at bedtime as needed for sleep. (Patient not taking: Reported on 08/07/2014) 30 tablet 0   No current facility-administered medications for this encounter.   Facility-Administered Medications Ordered in Other Encounters  Medication Dose Route Frequency Provider Last Rate Last Dose  . alteplase (ACTIVASE) injection 2 mg  2 mg Intracatheter Once [name], MD        Filed Vitals:   08/07/14 0850  BP: 102/64  Pulse: 63  Weight: 227 lb 12 oz (103.307 kg)  SpO2: 97%   General: NAD Wife present  Neck: Thick, no JVD. No thyromegaly or thyroid nodule.  Lungs: Clear to auscultation bilaterally with normal respiratory effort. CV: Lateral PMI.  Heart regular S1/S2, no S3/S4, no murmur.  No edema.  No carotid bruit.  Normal pedal pulses.  Abdomen: Obese, Soft, tender to palpation, obese, no hepatosplenomegaly, non-distended Skin: Intact without lesions or rashes.  Neurologic: Alert and oriented x 3.  Psych: Flat affect. Extremities: No clubbing or cyanosis.   Assessment/plan: 1. Chronic systolic CHF:  Nonischemic cardiomyopathy x years.  EF 20-25% with normal RV size and systolic function on 81/85 echo. Has St Jude ICD and has wide QRS (166 msec today) but not true LBBB pattern so suspect benefit from CRT upgrade would not be marked.  RHC in 3/16 showed low cardiac output with mixed pulmonary venous and pulmonary arterial hypertension ([place] may be due to vascular remodeling over time in the face of persistently elevated left atrial pressure).   CPX in 4/16 showed severe functional impairment but was submaximal.  He has started on milrinone gtt in 4/16 but remained symptomatic.  He returned in 5/16 for RHC/LHC.  This showed nonobstructive CAD, severely elevated PCWP, and severe pulmonary hypertension (again, mixed PAH/PVH).  Milrinone was increased, Revatio was  started, and he was diuresed.  Stable NYHA class III symptoms.  He is not volume overloaded on exam today, Corevue showed recently resolved fluid elevation.  His weight is coming down nicely.  Unfortunately, creatinine is higher today at 2.4. - Continue current  milrinone gtt 0.375.  - We discussed LVAD workup further today. Barriers to LVAD at this point are pulmonary HTN (as above), elevated creatinine (seems to be improving with milrinone), and chronic back pain.  The back pain appears controllable with Percocet currently.  I am going to arrange RHC in the next couple of weeks to be followed by tooth extractions.  If PA pressures have come down by RHC, then I think we may be able to proceed with LVAD if we can get his renal function to stabilize out.   - Decrease torsemide to 40 mg bid with elevated creatinine.   - Continue current digoxin, Toprol XL, spironolactone, and hydralazine/Imdur (off Entresto with elevated creatinine).  Check digoxin level today.    - Had BMET yesterday with AHC, will get results.   2. CKD: Creatinine unfortunately higher.  I will decrease torsemide as above and repeat BMET next week.    3. Atrial fibrillation: Paroxysmal.  He is in NSR.  He is on Xarelto and amiodarone 200 mg daily.  TSH followed by endocrinology for known hypothyroidism.  Check LFTs today.  Need to decrease Xarelto to 15 mg daily based on creatinine clearance.  There is a good chance that he will go back into atrial fibrillation on milrinone, but I do not think that maintenance of NSR over the long-term is going to do enough to avoid advanced therapies.  4. Obesity:  Weight is coming down.  5. DM2: Continue to follow with endocrinology.   6. Depression/anxiety:  Continue to see psychologist.    7. OSA: Complex.  He is awaiting Bipap titration, has seen Dr Halford Chessman.   8. 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  Continue statin.  9. 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  PVR 5.  He is now on Revatio.  He also needs to start on Bipap for OSA.  Needs lower PA pressure prior to LVAD placement. As above, will arrange repeat RHC.   Loralie Champagne  08/07/2014

## 2014-08-21 NOTE — Progress Notes (Addendum)
Site area: right groin a 7 french venous sheath was removed  Site Prior to Removal:  Level 0  Pressure Applied For 15 MINUTES    Minutes Beginning at 1735am  Manual:   Yes.    Patient Status During Pull:  stable  Post Pull Groin Site:  Level 0  Post Pull Instructions Given:  Yes.    Post Pull Pulses Present:  Yes.    Dressing Applied:  Yes.    Comments:  VS remain stable during sheath pull..  Fentanyl 25 IV given for lower back pain. Pain level at level 6 after pain med.

## 2014-08-21 NOTE — Progress Notes (Signed)
Patient admitted after [place].    He will need dental extractions tomorrow.   He is being worked up for LVAD.  Please see RHC report.  Unfortunately, still has elevated filling pressures, severe pulmonary hypertension (mixed pulmonary venous and pulmonary arterial hypertension), and marginal cardiac output.   Plan:  1. Monitor CVP and co-ox via PICC, will go to step down.  2. Needs dental extractions tomorrow.  3. Increase milrinone to 0.5 mcg/kg/min 4. Lasix 80 mg IV bid  5. Will plan to titrate up on Revatio, possibly add ERA with severe pulmonary hypertension, PVR > 5 WU.   Loralie Champagne 08/21/2014 5:17 PM

## 2014-08-22 ENCOUNTER — Encounter (HOSPITAL_COMMUNITY)
Admission: RE | Disposition: A | Discharge status: Home Health | Payer: Self-pay | Source: Ambulatory Visit | Attending: Cardiology

## 2014-08-22 ENCOUNTER — Inpatient Hospital Stay (HOSPITAL_COMMUNITY): Payer: Commercial Managed Care - HMO | Admitting: Anesthesiology

## 2014-08-22 ENCOUNTER — Inpatient Hospital Stay (HOSPITAL_COMMUNITY): Payer: Commercial Managed Care - HMO

## 2014-08-22 ENCOUNTER — Inpatient Hospital Stay (HOSPITAL_COMMUNITY): Admission: RE | Admit: 2014-08-22 | Payer: 59 | Source: Ambulatory Visit | Admitting: Dentistry

## 2014-08-22 ENCOUNTER — Encounter (HOSPITAL_COMMUNITY): Payer: Self-pay | Admitting: Anesthesiology

## 2014-08-22 DIAGNOSIS — I270 Primary pulmonary hypertension: Secondary | ICD-10-CM

## 2014-08-22 DIAGNOSIS — K029 Dental caries, unspecified: Secondary | ICD-10-CM

## 2014-08-22 DIAGNOSIS — K045 Chronic apical periodontitis: Secondary | ICD-10-CM

## 2014-08-22 DIAGNOSIS — I5023 Acute on chronic systolic (congestive) heart failure: Secondary | ICD-10-CM

## 2014-08-22 HISTORY — PX: MULTIPLE EXTRACTIONS WITH ALVEOLOPLASTY: SHX5342

## 2014-08-22 LAB — CBC WITH DIFFERENTIAL/PLATELET
Basophils Absolute: 0 10*3/uL (ref 0.0–0.1)
Basophils Relative: 1 % (ref 0–1)
Eosinophils Absolute: 0.3 10*3/uL (ref 0.0–0.7)
Eosinophils Relative: 5 % (ref 0–5)
HCT: 30.3 % — ABNORMAL LOW (ref 39.0–52.0)
Hemoglobin: 9.7 g/dL — ABNORMAL LOW (ref 13.0–17.0)
Lymphocytes Relative: 18 % (ref 12–46)
Lymphs Abs: 0.9 10*3/uL (ref 0.7–4.0)
MCH: 31.9 pg (ref 26.0–34.0)
MCHC: 32 g/dL (ref 30.0–36.0)
MCV: 99.7 fL (ref 78.0–100.0)
Monocytes Absolute: 0.6 10*3/uL (ref 0.1–1.0)
Monocytes Relative: 11 % (ref 3–12)
NEUTROS ABS: 3.4 10*3/uL (ref 1.7–7.7)
Neutrophils Relative %: 65 % (ref 43–77)
PLATELETS: 235 10*3/uL (ref 150–400)
RBC: 3.04 MIL/uL — AB (ref 4.22–5.81)
RDW: 15.1 % (ref 11.5–15.5)
WBC: 5.1 10*3/uL (ref 4.0–10.5)

## 2014-08-22 LAB — GLUCOSE, CAPILLARY
GLUCOSE-CAPILLARY: 212 mg/dL — AB (ref 65–99)
GLUCOSE-CAPILLARY: 212 mg/dL — AB (ref 65–99)
Glucose-Capillary: 213 mg/dL — ABNORMAL HIGH (ref 65–99)
Glucose-Capillary: 103 mg/dL — ABNORMAL HIGH (ref 65–99)

## 2014-08-22 LAB — BASIC METABOLIC PANEL
ANION GAP: 8 (ref 5–15)
BUN: 39 mg/dL — ABNORMAL HIGH (ref 6–20)
CHLORIDE: 97 mmol/L — AB (ref 101–111)
CO2: 26 mmol/L (ref 22–32)
CREATININE: 2.06 mg/dL — AB (ref 0.61–1.24)
Calcium: 8.4 mg/dL — ABNORMAL LOW (ref 8.9–10.3)
GFR calc non Af Amer: 33 mL/min — ABNORMAL LOW (ref >60)
GFR, EST AFRICAN AMERICAN: 38 mL/min — AB (ref >60)
GLUCOSE: 255 mg/dL — AB (ref 65–99)
POTASSIUM: 3.7 mmol/L (ref 3.5–5.1)
SODIUM: 131 mmol/L — AB (ref 135–145)

## 2014-08-22 LAB — CARBOXYHEMOGLOBIN
CARBOXYHEMOGLOBIN: 1.6 % — AB (ref 0.5–1.5)
Methemoglobin: 0.9 % (ref 0.0–1.5)
O2 Saturation: 64.9 %
Total hemoglobin: 9.6 g/dL — ABNORMAL LOW (ref 13.5–18.0)

## 2014-08-22 LAB — DIGOXIN LEVEL: DIGOXIN LVL: 0.7 ng/mL — AB (ref 0.8–2.0)

## 2014-08-22 LAB — SURGICAL PCR SCREEN
MRSA, PCR: NEGATIVE
STAPHYLOCOCCUS AUREUS: NEGATIVE

## 2014-08-22 SURGERY — MULTIPLE EXTRACTION WITH ALVEOLOPLASTY
Anesthesia: General | Site: Mouth

## 2014-08-22 MED ORDER — FENTANYL CITRATE (PF) 100 MCG/2ML IJ SOLN
INTRAMUSCULAR | Status: DC | PRN
  Administered 2014-08-22: 100 ug via INTRAVENOUS
  Administered 2014-08-22 (×2): 50 ug via INTRAVENOUS

## 2014-08-22 MED ORDER — LIDOCAINE-EPINEPHRINE 2 %-1:100000 IJ SOLN
INTRAMUSCULAR | Status: AC
  Filled 2014-08-22: qty 10.2

## 2014-08-22 MED ORDER — EPHEDRINE SULFATE 50 MG/ML IJ SOLN
INTRAMUSCULAR | Status: AC
  Filled 2014-08-22: qty 1

## 2014-08-22 MED ORDER — FENTANYL CITRATE (PF) 100 MCG/2ML IJ SOLN: 25.0000 ug | INTRAMUSCULAR | Status: DC | PRN

## 2014-08-22 MED ORDER — SODIUM CHLORIDE 0.9 % IJ SOLN
INTRAMUSCULAR | Status: AC
  Filled 2014-08-22: qty 10

## 2014-08-22 MED ORDER — HEMOSTATIC AGENTS (NO CHARGE) OPTIME
TOPICAL | Status: DC | PRN
  Administered 2014-08-22: 1 via TOPICAL

## 2014-08-22 MED ORDER — ONDANSETRON HCL 4 MG/2ML IJ SOLN
INTRAMUSCULAR | Status: DC | PRN
  Administered 2014-08-22: 4 mg via INTRAVENOUS

## 2014-08-22 MED ORDER — BUPIVACAINE-EPINEPHRINE (PF) 0.5% -1:200000 IJ SOLN
INTRAMUSCULAR | Status: AC
  Filled 2014-08-22: qty 3.6

## 2014-08-22 MED ORDER — ROCURONIUM BROMIDE 100 MG/10ML IV SOLN
INTRAVENOUS | Status: DC | PRN
  Administered 2014-08-22: 20 mg via INTRAVENOUS

## 2014-08-22 MED ORDER — OXYCODONE HCL 5 MG PO TABS
5.0000 mg | ORAL_TABLET | Freq: Once | ORAL | Status: DC | PRN
  Filled 2014-08-22: qty 1

## 2014-08-22 MED ORDER — BUPIVACAINE-EPINEPHRINE 0.5% -1:200000 IJ SOLN
INTRAMUSCULAR | Status: DC | PRN
  Administered 2014-08-22: 1.7 mL

## 2014-08-22 MED ORDER — ONDANSETRON HCL 4 MG/2ML IJ SOLN: 4.0000 mg | Freq: Four times a day (QID) | INTRAMUSCULAR | Status: DC | PRN

## 2014-08-22 MED ORDER — CEFAZOLIN SODIUM 10 G IJ SOLR
3.0000 g | INTRAMUSCULAR | Status: DC | PRN
  Administered 2014-08-22: 3 g via INTRAVENOUS

## 2014-08-22 MED ORDER — ETOMIDATE 2 MG/ML IV SOLN
INTRAVENOUS | Status: AC
  Filled 2014-08-22: qty 10

## 2014-08-22 MED ORDER — ETOMIDATE 2 MG/ML IV SOLN
INTRAVENOUS | Status: DC | PRN
  Administered 2014-08-22: 20 mg via INTRAVENOUS

## 2014-08-22 MED ORDER — OXYMETAZOLINE HCL 0.05 % NA SOLN
NASAL | Status: AC
  Filled 2014-08-22: qty 15

## 2014-08-22 MED ORDER — LACTATED RINGERS IV SOLN: INTRAVENOUS | Status: DC

## 2014-08-22 MED ORDER — CETYLPYRIDINIUM CHLORIDE 0.05 % MT LIQD
7.0000 mL | Freq: Two times a day (BID) | OROMUCOSAL | Status: DC
  Administered 2014-08-23 (×2): 7 mL via OROMUCOSAL

## 2014-08-22 MED ORDER — PROPOFOL 10 MG/ML IV BOLUS
INTRAVENOUS | Status: AC
  Filled 2014-08-22: qty 20

## 2014-08-22 MED ORDER — GLYCOPYRROLATE 0.2 MG/ML IJ SOLN
INTRAMUSCULAR | Status: DC | PRN
  Administered 2014-08-22: 0.6 mg via INTRAVENOUS

## 2014-08-22 MED ORDER — ROCURONIUM BROMIDE 50 MG/5ML IV SOLN
INTRAVENOUS | Status: AC
  Filled 2014-08-22: qty 1

## 2014-08-22 MED ORDER — MORPHINE SULFATE 2 MG/ML IJ SOLN
2.0000 mg | INTRAMUSCULAR | Status: DC | PRN
  Administered 2014-08-23 (×2): 2 mg via INTRAVENOUS
  Filled 2014-08-22: qty 2

## 2014-08-22 MED ORDER — FENTANYL CITRATE (PF) 250 MCG/5ML IJ SOLN
INTRAMUSCULAR | Status: AC
  Filled 2014-08-22: qty 5

## 2014-08-22 MED ORDER — MIDAZOLAM HCL 5 MG/5ML IJ SOLN
INTRAMUSCULAR | Status: DC | PRN
Start: 2014-08-22 — End: 2014-08-22
  Administered 2014-08-22: 1 mg via INTRAVENOUS

## 2014-08-22 MED ORDER — NEOSTIGMINE METHYLSULFATE 10 MG/10ML IV SOLN
INTRAVENOUS | Status: DC | PRN
  Administered 2014-08-22: 4 mg via INTRAVENOUS

## 2014-08-22 MED ORDER — BOOST / RESOURCE BREEZE PO LIQD
1.0000 | Freq: Three times a day (TID) | ORAL | Status: DC
  Administered 2014-08-22 – 2014-08-23 (×3): 1 via ORAL

## 2014-08-22 MED ORDER — MIDAZOLAM HCL 2 MG/2ML IJ SOLN
INTRAMUSCULAR | Status: AC
  Filled 2014-08-22: qty 2

## 2014-08-22 MED ORDER — PHENYLEPHRINE 40 MCG/ML (10ML) SYRINGE FOR IV PUSH (FOR BLOOD PRESSURE SUPPORT)
PREFILLED_SYRINGE | INTRAVENOUS | Status: AC
  Filled 2014-08-22: qty 10

## 2014-08-22 MED ORDER — OXYCODONE HCL 5 MG/5ML PO SOLN: 5.0000 mg | Freq: Once | ORAL | Status: DC | PRN

## 2014-08-22 MED ORDER — LACTATED RINGERS IV SOLN
INTRAVENOUS | Status: DC | PRN
  Administered 2014-08-22: 07:00:00 via INTRAVENOUS

## 2014-08-22 MED ORDER — SUCCINYLCHOLINE CHLORIDE 20 MG/ML IJ SOLN
INTRAMUSCULAR | Status: AC
  Filled 2014-08-22: qty 2

## 2014-08-22 MED ORDER — ONDANSETRON HCL 4 MG/2ML IJ SOLN
INTRAMUSCULAR | Status: AC
  Filled 2014-08-22: qty 2

## 2014-08-22 MED ORDER — SUCCINYLCHOLINE CHLORIDE 20 MG/ML IJ SOLN
INTRAMUSCULAR | Status: DC | PRN
  Administered 2014-08-22: 120 mg via INTRAVENOUS

## 2014-08-22 MED ORDER — 0.9 % SODIUM CHLORIDE (POUR BTL) OPTIME
TOPICAL | Status: DC | PRN
  Administered 2014-08-22: 1000 mL

## 2014-08-22 MED ORDER — GLYCOPYRROLATE 0.2 MG/ML IJ SOLN
INTRAMUSCULAR | Status: AC
  Filled 2014-08-22: qty 3

## 2014-08-22 MED ORDER — PHENYLEPHRINE HCL 10 MG/ML IJ SOLN
10.0000 mg | INTRAMUSCULAR | Status: DC | PRN
  Administered 2014-08-22: 30 ug/min via INTRAVENOUS

## 2014-08-22 MED ORDER — CETYLPYRIDINIUM CHLORIDE 0.05 % MT LIQD
7.0000 mL | Freq: Two times a day (BID) | OROMUCOSAL | Status: DC
  Administered 2014-08-23 – 2014-08-24 (×3): 7 mL via OROMUCOSAL

## 2014-08-22 MED ORDER — LIDOCAINE-EPINEPHRINE 2 %-1:100000 IJ SOLN
INTRAMUSCULAR | Status: DC | PRN
Start: 2014-08-22 — End: 2014-08-22
  Administered 2014-08-22: 3.4 mL via INTRADERMAL

## 2014-08-22 MED ORDER — AMINOCAPROIC ACID SOLUTION 5% (50 MG/ML)
10.0000 mL | ORAL | Status: AC
  Administered 2014-08-22 (×5): 10 mL via ORAL
  Filled 2014-08-22 (×2): qty 100

## 2014-08-22 MED ORDER — SILDENAFIL CITRATE 20 MG PO TABS
60.0000 mg | ORAL_TABLET | Freq: Three times a day (TID) | ORAL | Status: DC
  Administered 2014-08-22 (×2): 60 mg via ORAL
  Filled 2014-08-22 (×5): qty 3

## 2014-08-22 MED ORDER — LIDOCAINE HCL (CARDIAC) 20 MG/ML IV SOLN
INTRAVENOUS | Status: AC
  Filled 2014-08-22: qty 5

## 2014-08-22 MED ORDER — ARTIFICIAL TEARS OP OINT
TOPICAL_OINTMENT | OPHTHALMIC | Status: DC | PRN
Start: 2014-08-22 — End: 2014-08-22
  Administered 2014-08-22: 1 via OPHTHALMIC

## 2014-08-22 MED FILL — Heparin Sodium (Porcine) 2 Unit/ML in Sodium Chloride 0.9%: INTRAMUSCULAR | Qty: 500 | Status: AC

## 2014-08-22 SURGICAL SUPPLY — 34 items
ALCOHOL 70% 16 OZ (MISCELLANEOUS) ×3 IMPLANT
ATTRACTOMAT 16X20 MAGNETIC DRP (DRAPES) ×3 IMPLANT
BLADE SURG 15 STRL LF DISP TIS (BLADE) ×2 IMPLANT
BLADE SURG 15 STRL SS (BLADE) ×4
COVER SURGICAL LIGHT HANDLE (MISCELLANEOUS) ×6 IMPLANT
GAUZE PACKING FOLDED 2  STR (GAUZE/BANDAGES/DRESSINGS) ×2
GAUZE PACKING FOLDED 2 STR (GAUZE/BANDAGES/DRESSINGS) ×1 IMPLANT
GAUZE SPONGE 4X4 16PLY XRAY LF (GAUZE/BANDAGES/DRESSINGS) ×6 IMPLANT
GLOVE BIOGEL PI IND STRL 6 (GLOVE) ×1 IMPLANT
GLOVE BIOGEL PI INDICATOR 6 (GLOVE) ×2
GLOVE SURG ORTHO 8.0 STRL STRW (GLOVE) ×3 IMPLANT
GLOVE SURG SS PI 6.0 STRL IVOR (GLOVE) ×3 IMPLANT
GOWN STRL REUS W/ TWL LRG LVL3 (GOWN DISPOSABLE) ×1 IMPLANT
GOWN STRL REUS W/TWL 2XL LVL3 (GOWN DISPOSABLE) ×3 IMPLANT
GOWN STRL REUS W/TWL LRG LVL3 (GOWN DISPOSABLE) ×2
HEMOSTAT SURGICEL 2X14 (HEMOSTASIS) ×3 IMPLANT
KIT BASIN OR (CUSTOM PROCEDURE TRAY) ×3 IMPLANT
KIT ROOM TURNOVER OR (KITS) ×3 IMPLANT
MANIFOLD NEPTUNE WASTE (CANNULA) ×3 IMPLANT
NEEDLE BLUNT 16X1.5 OR ONLY (NEEDLE) ×3 IMPLANT
NS IRRIG 1000ML POUR BTL (IV SOLUTION) ×3 IMPLANT
PACK EENT II TURBAN DRAPE (CUSTOM PROCEDURE TRAY) ×3 IMPLANT
PAD ARMBOARD 7.5X6 YLW CONV (MISCELLANEOUS) ×3 IMPLANT
SPONGE GAUZE 4X4 12PLY STER LF (GAUZE/BANDAGES/DRESSINGS) ×3 IMPLANT
SPONGE SURGIFOAM ABS GEL 100 (HEMOSTASIS) ×3 IMPLANT
SPONGE SURGIFOAM ABS GEL 12-7 (HEMOSTASIS) IMPLANT
SPONGE SURGIFOAM ABS GEL SZ50 (HEMOSTASIS) IMPLANT
SUCTION FRAZIER TIP 10 FR DISP (SUCTIONS) IMPLANT
SUT CHROMIC 3 0 PS 2 (SUTURE) ×12 IMPLANT
SYR 50ML SLIP (SYRINGE) ×3 IMPLANT
TOWEL OR 17X26 10 PK STRL BLUE (TOWEL DISPOSABLE) ×3 IMPLANT
TUBE CONNECTING 12'X1/4 (SUCTIONS) ×1
TUBE CONNECTING 12X1/4 (SUCTIONS) ×2 IMPLANT
YANKAUER SUCT BULB TIP NO VENT (SUCTIONS) ×3 IMPLANT

## 2014-08-22 NOTE — Progress Notes (Signed)
Advanced Heart Failure Rounding Note   Subjective:    Sitting up in bed, remains drowsy and in pain from dental extraction.  Denies any SOB,  Wife at bedside.  Said he had been fine at home.  Initial reason for this encounter RHC and dental extraction.  Weight appears down from weight in clinic several weeks ago.  227 lbs -> 225  RHC Procedural Findings: Hemodynamics (mmHg) RA mean 14 RV 92/21 PA 91/34, mean 51 PCWP mean 27 Oxygen saturations: PA 55% AO 97% Cardiac Output (Fick) 4.75  Cardiac Index (Fick) 2.18 PVR 5.1 WU Cardiac Output (Thermo) 4.18 Cardiac Index (Thermo) 1.92 PVR 5.7 WU   Objective:   Weight Range: 224 lb 14.4 oz (102.014 kg) Body mass index is 32.27 kg/(m^2).   Vital Signs:   Temp:  [97.6 F (36.4 C)-98.2 F (36.8 C)] 98 F (36.7 C) (07/14 1024) Pulse Rate:  [0-128] 78 (07/14 1145) Resp:  [7-22] 19 (07/14 1145) BP: (106-152)/(51-85) 130/63 mmHg (07/14 1025) SpO2:  [87 %-99 %] 96 % (07/14 1145) Arterial Line BP: (163-200)/(61-85) 179/67 mmHg (07/14 1145) Weight:  [222 lb (100.699 kg)-224 lb 14.4 oz (102.014 kg)] 224 lb 14.4 oz (102.014 kg) (07/14 0500) Last BM Date:  (PTA)  Weight change: Filed Weights   08/21/14 1350 08/21/14 1845 08/22/14 0500  Weight: 222 lb (100.699 kg) 224 lb 13.9 oz (102 kg) 224 lb 14.4 oz (102.014 kg)    Intake/Output:   Intake/Output Summary (Last 24 hours) at 08/22/14 1205 Last data filed at 08/22/14 1100  Gross per 24 hour  Intake 721.95 ml  Output   1925 ml  Net -1203.05 ml     Physical Exam: General: NAD. Neuro: Alert and oriented X 3. Moves all extremities spontaneously. Psych: Flat affect, appropriate s/p dental extraction HEENT: Slightly swollen s/p extraction. Dried blood at corner of mouth Neck: Supple without bruits. JVP not appreciated, but pt seated on end of bed currently. Lungs: Resp regular and unlabored. Bibasilar crackles, L > R.  Heart: RRR no s3, s4, or murmurs appreciated Abdomen:  Soft, non-tender, non-distended, BS + x 4.  Extremities: No clubbing, cyanosis. 0-trace edema bilaterally. DP/PT/Radials 2+ and equal bilaterally.   Telemetry: SR  Labs: CBC  Recent Labs  08/22/14 0506  WBC 5.1  NEUTROABS 3.4  HGB 9.7*  HCT 30.3*  MCV 99.7  PLT 829   Basic Metabolic Panel  Recent Labs  08/21/14 2235 08/22/14 0500  NA 132* 131*  K 4.0 3.7  CL 99* 97*  CO2 24 26  GLUCOSE 247* 255*  BUN 39* 39*  CALCIUM 8.9 8.4*   Liver Function Tests No results for input(s): AST, ALT, ALKPHOS, BILITOT, PROT, ALBUMIN in the last 72 hours. No results for input(s): LIPASE, AMYLASE in the last 72 hours. Cardiac Enzymes No results for input(s): CKTOTAL, CKMB, CKMBINDEX, TROPONINI in the last 72 hours.  BNP: BNP (last 3 results)  Recent Labs  05/22/14 1029 05/27/14 1126 06/19/14 1030  BNP 935.2* 1627.8* 738.9*    ProBNP (last 3 results)  Recent Labs  10/09/13 0926 11/14/13 0926 01/14/14 1112  PROBNP 1561.0* 1261.0* 5769.0*     D-Dimer No results for input(s): DDIMER in the last 72 hours. Hemoglobin A1C No results for input(s): HGBA1C in the last 72 hours. Fasting Lipid Panel No results for input(s): CHOL, HDL, LDLCALC, TRIG, CHOLHDL, LDLDIRECT in the last 72 hours. Thyroid Function Tests No results for input(s): TSH, T4TOTAL, T3FREE, THYROIDAB in the last 72 hours.  Invalid input(s): FREET3  Other results:     Imaging/Studies:  Dg Chest Port 1 View  08/22/2014   CLINICAL DATA:  Pre operative respiratory exam.  EXAM: PORTABLE CHEST - 1 VIEW  COMPARISON:  05/27/2014  FINDINGS: AICD in place. PICC tip in the superior vena cava in good position. Heart size and pulmonary vascularity are normal. Lungs are clear. No acute osseous abnormality.  IMPRESSION: No acute abnormality.   Electronically Signed   By: Lorriane Shire M.D.   On: 08/22/2014 07:07     Latest Echo  Latest Cath   Medications:     Scheduled Medications: . aminocaproic acid   10 mL Oral Q1H  . amiodarone  200 mg Oral Daily  . atorvastatin  40 mg Oral Daily  . digoxin  0.0625 mg Oral Daily  . DULoxetine  60 mg Oral BID  . furosemide  80 mg Intravenous BID  . hydrALAZINE  75 mg Oral TID  . insulin aspart  30 Units Subcutaneous TID AC  . insulin glargine  32 Units Subcutaneous QHS  . levothyroxine  75 mcg Oral QAC breakfast  . metoprolol succinate  25 mg Oral BID AC  . multivitamin with minerals  1 tablet Oral Daily  . potassium chloride  20 mEq Oral BID  . senna  1 tablet Oral QHS  . sildenafil  40 mg Oral TID  . sodium chloride  10-40 mL Intracatheter Q12H  . sodium chloride  3 mL Intravenous Q12H  . sodium chloride  3 mL Intravenous Q12H  . sodium chloride  3 mL Intravenous Q12H  . spironolactone  25 mg Oral Daily     Infusions: . lactated ringers    . milrinone 0.5 mcg/kg/min (08/22/14 1100)     PRN Medications:  sodium chloride, sodium chloride, sodium chloride, acetaminophen, ALPRAZolam, fentaNYL (SUBLIMAZE) injection, morphine injection, ondansetron (ZOFRAN) IV, ondansetron (ZOFRAN) IV, oxyCODONE-acetaminophen **AND** oxyCODONE, oxyCODONE **OR** oxyCODONE, sodium chloride, sodium chloride, sodium chloride, sodium chloride, zolpidem   Assessment/Plan   1. Chronic systolic CHF, Stable NYHA class III symptoms. NICM EF 20-25% with normal RV size and systolic function on 09/98 echo.  - No marked fluid overload - St Jude ICD has wide QRS  but no true LBBB pattern so benefit from CRT likely minimal - RHC yesterday continues to show severe Pulmonary hypertension with PVR > 5WU as below. - Milrinone increase to gtt 0.5.  - In process of LVAD work up. - Continue current digoxin, Toprol XL, spironolactone, and hydralazine/Imdur - No ACEI/ARB with renal insufficiency. Was on Entresto. - CVP 10 currently,  Coox 64.9  - Continue IV lasix 80 mg BID.  May be able to d/c with CVP 10, vs continuing today and decrease tomorrow. Will consult MD. 2. CKD,  Stage 3 - Cr 2.1 -> 2.06. Stable currently. - Will follow with diuresis  3. Atrial fibrillation: Paroxysmal.  - He is in NSRcurrently on Xarelto and amiodarone 200 mg daily.  4. DM2: - Continue home meds  5. Depression/anxiety:  6. OSA:  - Awaiting bipap at home. Will consult MD if anything can be started while inpatient.  Likely not a good time s/p dental extracion. 7. CAD:  -Nonobstructive on coronary angiography in 5/16.  - Continue statin.  8. Pulmonary hypertension:  - Mixed pulmonary venous hypertension and PAH - Plan to titrate up Revatio per Dr. Aundra Dubin, possibly add ERA with severe hypertension PVR > 5WU  Length of Stay: 1  Shirley Friar PA-C 08/22/2014, 12:05 PM  Advanced Heart Failure  Team Pager [phone] (M-F; [place])  Please contact [facility] for night-coverage after hours (4p -7a ) and weekends on [email or web address]  Patient seen with PA, agree with the above note.   He is being worked up for LVAD. Please see RHC report. Unfortunately, still has elevated filling pressures, severe pulmonary hypertension (mixed pulmonary venous and pulmonary arterial hypertension), and marginal cardiac output.   Creatinine down to 2.09 today.   Plan:  1. Monitor CVP and co-ox via PICC.  2. Dental extractions done, hopefully resume Xarelto in am.   3. Increased milrinone to 0.5 mcg/kg/min => co-ox better today at 66%.  4. Lasix 80 mg IV bid => Continue today, CVP 10.  5. Will plan to titrate up on Revatio (to 60 tid today), possibly add ERA with severe pulmonary hypertension, PVR > 5 WU.  6. Will keep him in hospital for now to optimize, possible repeat RHC early next week.   Loralie Champagne 08/22/2014 12:50 PM

## 2014-08-22 NOTE — Care Management Note (Signed)
Case Management Note  Patient Details  Name: Larry [name] MRN: [id number] Date of Birth: 09/29/1950  Subjective/Objective:      Adm w heart failure              Action/Plan: lives w fam, pcp dr Lennette Bihari [place]   Expected Discharge Date:                  Expected Discharge Plan:  [place]  In-House Referral:     Discharge planning Services  CM Consult  Post Acute Care Choice:  Resumption of Svcs/PTA Provider Choice offered to:     DME Arranged:    DME Agency:     HH Arranged:  RN, Disease Management [place] Agency:  [place]  Status of Service:     Medicare Important Message Given:    Date Medicare IM Given:    Medicare IM give by:    Date Additional Medicare IM Given:    Additional Medicare Important Message give by:     If discussed at [place] of Stay Meetings, dates discussed:    Additional Comments:ur review done, alerted donna w ahc that pt had been adm  Lacretia Leigh, RN 08/22/2014, 11:10 AM

## 2014-08-22 NOTE — Discharge Instructions (Signed)

## 2014-08-22 NOTE — Progress Notes (Signed)
Inpatient Diabetes Program Recommendations  AACE/ADA: New Consensus Statement on Inpatient Glycemic Control (2013)  Target Ranges:  Prepandial:   less than 140 mg/dL      Peak postprandial:   less than 180 mg/dL (1-2 hours)      Critically ill patients:  140 - 180 mg/dL   Results for DAMAREON, LANNI (MRN [id number]) as of 08/22/2014 09:25  Ref. Range 08/21/2014 14:00 08/21/2014 17:37 08/21/2014 22:23 08/22/2014 05:45  Glucose-Capillary Latest Ref Range: 65-99 mg/dL 197 (H) 166 (H) 221 (H) 212 (H)    Diabetes history: DM2 Outpatient Diabetes medications: Lantus 32 units QHS, Novolog 30 units TID with meals Current orders for Inpatient glycemic control: Lantus 32 units QHS, Novolog 30 units TID with meals  Inpatient Diabetes Program Recommendations Correction (SSI): While inpatient, please consider ordering Novolog correction scale (in addition to Novolog meal coverage).  Thanks, Barnie Alderman, RN, MSN, CCRN, CDE Diabetes Coordinator Inpatient Diabetes Program [phone] (Team Pager from [place] to [place]) [phone] ([facility] office) [phone] [facility] office) [phone] [facility] office)

## 2014-08-22 NOTE — Anesthesia Preprocedure Evaluation (Signed)
Anesthesia Evaluation  Patient identified by MRN, date of birth, ID band Patient awake    Reviewed: Allergy & Precautions, NPO status , Patient's Chart, lab work & pertinent test results  Airway Mallampati: II   Neck ROM: full    Dental   Pulmonary sleep apnea , former smoker,  Pulmonary HTN. breath sounds clear to auscultation        Cardiovascular hypertension, + CAD, + Peripheral Vascular Disease and +CHF + dysrhythmias Atrial Fibrillation + Cardiac Defibrillator Rhythm:regular Rate:Normal  Non-obstructive CAD.  Pt is candidate for VAD. LVEF 25%.   Neuro/Psych Anxiety Depression CVA, No Residual Symptoms    GI/Hepatic   Endo/Other  diabetes, Type 2Hypothyroidism obese  Renal/GU Renal InsufficiencyRenal disease     Musculoskeletal  (+) Arthritis -,   Abdominal   Peds  Hematology   Anesthesia Other Findings   Reproductive/Obstetrics                             Anesthesia Physical Anesthesia Plan  ASA: IV  Anesthesia Plan: General   Post-op Pain Management:    Induction: Intravenous  Airway Management Planned: Oral ETT  Additional Equipment:   Intra-op Plan:   Post-operative Plan: Extubation in OR  Informed Consent: I have reviewed the patients History and Physical, chart, labs and discussed the procedure including the risks, benefits and alternatives for the proposed anesthesia with the patient or authorized representative who has indicated his/her understanding and acceptance.     Plan Discussed with: CRNA, Anesthesiologist and Surgeon  Anesthesia Plan Comments:         Anesthesia Quick Evaluation

## 2014-08-22 NOTE — Progress Notes (Signed)
PRE-OPERATIVE NOTE:  08/22/2014   Sherrilee Gilles [id number]  VITALS: BP 133/58 mmHg  Pulse 65  Temp(Src) 97.6 F (36.4 C) (Oral)  Resp 20  Ht 5\' 10"  (1.778 m)  Wt 224 lb 14.4 oz (102.014 kg)  BMI 32.27 kg/m2  SpO2 92%  Lab Results  Component Value Date   WBC 5.1 08/22/2014   HGB 9.7* 08/22/2014   HCT 30.3* 08/22/2014   MCV 99.7 08/22/2014   PLT 235 08/22/2014   BMET    Component Value Date/Time   NA 131* 08/22/2014 0500   K 3.7 08/22/2014 0500   CL 97* 08/22/2014 0500   CO2 26 08/22/2014 0500   GLUCOSE 255* 08/22/2014 0500   BUN 39* 08/22/2014 0500   CREATININE 2.06* 08/22/2014 0500   CALCIUM 8.4* 08/22/2014 0500   GFRNONAA 33* 08/22/2014 0500   GFRAA 38* 08/22/2014 0500    Lab Results  Component Value Date   INR 2.69* 05/28/2014   INR 2.08* 05/08/2014   INR 1.47 02/01/2014   No results found for: PTT   Sherrilee Gilles presents for extraction remaining teeth with alveoloplasty and pre-prosthetic surgery as indicated in the operating room with general anesthesia.  Patient had cardiac catheterization with Dr. Aundra Dubin yesterday. Patient is cleared for dental extractions today.  SUBJECTIVE: The patient denies any acute medical or dental changes and agrees to proceed with treatment as planned.  EXAM: No sign of acute dental changes.  ASSESSMENT: Patient is affected by multiple retained root segments, chronic apical periodontitis, chronic periodontitis, dental caries, and bilateral mandibular lingual tori.  PLAN: Patient agrees to proceed with treatment as planned in the operating room as previously discussed and accepts the risks, benefits, and complications of the proposed treatment. Patient is aware of the risk for bleeding, bruising, swelling, infection, pain, nerve damage, soft tissue damage, sinus involvement, root tip fracture, mandible fracture, and the risks of complications associated with the anesthesia. Patient is aware of the potential for  complications up to and including death due to his overall cardiovascular and respiratory compromise. Patient also is aware of the potential for other complications not mentioned above.   Lenn Cal, DDS

## 2014-08-22 NOTE — Anesthesia Postprocedure Evaluation (Signed)
Anesthesia Post Note  Patient: Larry [name]  Procedure(s) Performed: Procedure(s) (LRB): Extraction of toothy #'s 3,5,6,7,9,10,11,12,13,14,20,21,22,23,27,28,29,30 with alveoloplasty and bilateral mandibular tori reductions (N/A)  Anesthesia type: General  Patient location: PACU  Post pain: Pain level controlled and Adequate analgesia  Post assessment: Post-op Vital signs reviewed, Patient's Cardiovascular Status Stable, Respiratory Function Stable, Patent Airway and Pain level controlled  Last Vitals:  Filed Vitals:   08/22/14 1024  BP:   Pulse:   Temp: 36.7 C  Resp:     Post vital signs: Reviewed and stable  Level of consciousness: awake, alert  and oriented  Complications: No apparent anesthesia complications

## 2014-08-22 NOTE — Transfer of Care (Signed)
Immediate Anesthesia Transfer of Care Note  Patient: Larry [name]  Procedure(s) Performed: Procedure(s): Extraction of toothy #'s 3,5,6,7,9,10,11,12,13,14,20,21,22,23,27,28,29,30 with alveoloplasty and bilateral mandibular tori reductions (N/A)  Patient Location: PACU  Anesthesia Type:General  Level of Consciousness: awake, alert  and oriented  Airway & Oxygen Therapy: Patient Spontanous Breathing and Patient connected to face mask oxygen  Post-op Assessment: Report given to RN and Post -op Vital signs reviewed and stable  Post vital signs: Reviewed and stable  Last Vitals:  Filed Vitals:   08/22/14 0609  BP:   Pulse: 65  Temp:   Resp:     Complications: No apparent anesthesia complications

## 2014-08-22 NOTE — Anesthesia Procedure Notes (Signed)
Procedure Name: Intubation Date/Time: 08/22/2014 7:42 AM Performed by: Susa Loffler Pre-anesthesia Checklist: Patient identified, Timeout performed, Emergency Drugs available, Suction available and Patient being monitored Patient Re-evaluated:Patient Re-evaluated prior to inductionOxygen Delivery Method: Circle system utilized Preoxygenation: Pre-oxygenation with 100% oxygen Intubation Type: IV induction Ventilation: Mask ventilation without difficulty and Oral airway inserted - appropriate to patient size Laryngoscope Size: Mac and 4 Grade View: Grade I Tube type: Oral Tube size: 7.5 mm Number of attempts: 1 Airway Equipment and Method: Stylet and Oral airway Placement Confirmation: ETT inserted through vocal cords under direct vision,  positive ETCO2 and breath sounds checked- equal and bilateral Secured at: 22 cm Tube secured with: Tape Dental Injury: Teeth and Oropharynx as per pre-operative assessment

## 2014-08-22 NOTE — Op Note (Signed)
OPERATIVE REPORT  Patient:            Larry [name] Date of Birth:  [birth year] MRN:                [id number]   DATE OF PROCEDURE:  08/22/2014  PREOPERATIVE DIAGNOSES: 1. Cardiomyopathy 2. Pre-LVAD placement dental protocol 3. Chronic apical periodontitis 4. Retained root segments 5. Dental caries 6. Chronic periodontitis 7. Tooth mobility 8. Bilateral mandibular lingual tori  POSTOPERATIVE DIAGNOSES: 1. Cardiomyopathy 2. Pre-LVAD placement dental protocol 3. Chronic apical periodontitis 4. Retained root segments 5. Dental caries 6. Chronic periodontitis 7. Tooth mobility 8. Bilateral mandibular lingual tori  OPERATIONS: 1. Multiple extraction of tooth numbers 3, 5, 6, 7, 9, 10, 11, 12, 13, 14, 20, 21, 22, 23, 27, 28, 29, and 30 2. 4 Quadrants of alveoloplasty 3. Bilateral mandibular lingual tori reductions   SURGEON: Lenn Cal, DDS  ASSISTANT: Camie Patience, (dental assistant)  ANESTHESIA: General anesthesia via oral endotracheal tube.  MEDICATIONS: 1. Ancef 3 g IV prior to invasive dental procedures. 2. Local anesthesia with a total utilization of 6 carpules each containing 34 mg of lidocaine with 0.017 mg of epinephrine as well as 2 carpules each containing 9 mg of bupivacaine with 0.009 mg of epinephrine.  SPECIMENS: There are 18 teeth that were discarded.  DRAINS: None  CULTURES: None  COMPLICATIONS: None   ESTIMATED BLOOD LOSS: 100 mLs.  INTRAVENOUS FLUIDS: 500 mLs of Lactated ringers solution.  INDICATIONS: The patient was previously diagnosed with significant cardiomyopathy requiring left ventricular assist device placement.   A medically necessary dental consultation was then requested to rule out dental infection that may affect the patient's systemic health and anticipated left ventricular assist device placement.  The patient was examined and treatment planned for extraction of remaining teeth with alveoloplasty and pre-prosthetic  surgery as indicated.    OPERATIVE FINDINGS: Patient was examined operating room number 11.  The teeth were identified for extraction. The patient was noted be affected by chronic periodontitis, chronic apical periodontitis, multiple retained root segments, dental caries, tooth mobility, and bilateral mandibular lingual tori.   DESCRIPTION OF PROCEDURE: Patient was brought to the main operating room number 11. Patient was then placed in the supine position on the operating table. General anesthesia was then induced per the anesthesia team. The patient was then prepped and draped in the usual manner for dental medicine procedure. A timeout was performed. The patient was identified and procedures were verified. A throat pack was placed at this time. The oral cavity was then thoroughly examined with the findings noted above. The patient was then ready for dental medicine procedure as follows:  Local anesthesia was then administered sequentially with a total utilization of 6 carpules each containing 34 mg of lidocaine with 0.017 mg of epinephrine as well as 2 carpules  each containing 9 mg bupivacaine with 0.009 mg of epinephrine.  The Maxillary left and right quadrants first approached. Anesthesia was then delivered utilizing infiltration with lidocaine with epinephrine. A #15 blade incision was then made from the maxillary right tuberosity and extended to the maxillary left tuberosity.  A  surgical flap was then carefully reflected. Appropriate amounts of buccal and interseptal bone were then removed utilizing a surgical handpiece and bur and copious amounts of sterile water.  The maxillary teeth were then subluxated with a series of straight elevators. Tooth numbers 3, 5, 6, 7, 9, 10, 11, 12, 13, 14 were then removed with a 150 forceps without  complications. Alveoloplasty was then performed utilizing a ronguers and bone file. The tissues were approximated and trimmed appropriately. The surgical site was  then irrigated with copious amounts of sterile saline. A piece of Surgicel was then placed in the extraction sockets appropriately. The maxillary right surgical site was then closed from the maxillary right tuberosity and extended the mesial #8 utilizing chromic gut suture in a continuous interrupted suture technique 1. The maxillary left surgical site was then closed from the maxillary left tuberosity and extended to the mesial #9 utilizing 3-0 chromic gut suture in a continuous interrupted suture technique 1. 2 individual interrupted sutures were then placed to further close the surgical site as needed.  At this point time, the mandibular quadrants were approached. The patient was given bilateral inferior alveolar nerve blocks and long buccal nerve blocks utilizing the bupivacaine with epinephrine. Further infiltration was then achieved utilizing the lidocaine with epinephrine. A 15 blade incision was then made from the distal of number 19 and extended to the distal of #31. A surgical flap was then carefully reflected. Appropriate amounts of buccal and interseptal bone were then removed utilizing a surgical handpiece and copious amount of sterile water. The teeth were then subluxated with a series straight elevators. Tooth #30 was then removed with a 17 forceps without complications. Tooth numbers 29, 28, 27, 23, 22, 21, and 20 were then removed with a 151 forceps without complications. Alveoloplasty was then performed utilizing a rongeurs and bone file. The tissues were approximated and trimmed appropriately. The mandibular right and mandibular left lingual tori then visualized and reduced utilizing a surgical handpiece and bur and copious postural saline. Further alveoloplasty was then performed utilizing a rongeur and bone file as needed. The mandibular left and mandibular right surgical site was then irrigated with copious amounts sterile saline 4. The tissues were then approximated and trimmed  appropriately. A piece of Surgicel was then placed in the extraction sockets appropriately. The mandibular left surgical site was then closed from the distal of  19 and extended to the mesial #24 utilizing 3-0 chromic gut suture in a continuous interrupted suture technique 1. The mandibular right surgical site was then closed from the distal of #31 and extended to the mesial #25 utilizing 3-0 chromic gut suture in a continuous interrupted suture technique 1.   At this point time, the entire mouth was irrigated with copious amounts of sterile saline. The patient was examined for complications, seeing none, the dental medicine procedure was deemed to be complete. The throat pack was removed at this time. An oral airway was then placed at the request of the anesthesia team. A series of 4 x 4 gauze moistened with Amicar 5% oral rinse was placed in the mouth to aid hemostasis. The patient was then handed over to the anesthesia team for final disposition. After an appropriate amount of time, the patient was extubated and taken to the postanesthsia care unit in good condition. All counts were correct for the dental medicine procedure. The patient is to continue Amicar 5% oral rinses for the next 10 hours. Patient is to rinse with 10 mLs every hour for 10 hours in a swish and spit manner.  The patient is to have anticoagulation therapy held until tomorrow and then restarted as per cardiology orders if no significant oral oozing is present. Patient to be kept overnight as an inpatient for observation and then discharged by Cardiology as indicated. Patient is to be discharged with Percocet 5/325 pain medication.Patient is  to use one to 2 tablets every 4 hours by mouth as needed for moderate to severe pain. Patient return to dental medicine for evaluation for suture removal in 7-10 days.   Lenn Cal, DDS.

## 2014-08-22 NOTE — Progress Notes (Signed)
Pt came to and dc from PACU on Milrinone drip at 0.5 mcg/kg/min.

## 2014-08-22 NOTE — Progress Notes (Signed)
Initial Nutrition Assessment  DOCUMENTATION CODES:   Obesity unspecified  INTERVENTION:   -Boost Breeze po TID, each supplement provides 250 kcal and 9 grams of protein -RD will follow for diet advancement and tolerance with textures as appropriate  NUTRITION DIAGNOSIS:   Inadequate oral intake related to  (masticatory difficulty) as evidenced by  (s/p tooth extractions, on clear liquid diet).   GOAL:   Patient will meet greater than or equal to 90% of their needs   MONITOR:   PO intake, Supplement acceptance, Diet advancement, Labs, Weight trends, Skin, I & O's  REASON FOR ASSESSMENT:   Consult  (edentulous)  ASSESSMENT:   Pt is a 64 year old male with hx of CHF, s/p rt heart cath. Per cardiolgy, being worked-up for LVAD.  Pt s/p rt heart cath on 08/21/14.   S/p Procedures on 08/22/14: 1. Multiple extraction of tooth numbers 3, 5, 6, 7, 9, 10, 11, 12, 13, 14, 20, 21, 22, 23, 27, 28, 29, and 30 2. 4 Quadrants of alveoloplasty 3. Bilateral mandibular lingual tori reductions   Pt very drowsy at time of visit, unable to participate in interview at this time.   Weight hx reviewed. UBW around 240#; per MD, notes weights trending down due to diuresis.   Nutrition-Focused physical exam completed. Findings are no fat depletion, no muscle depletion, and mild edema.   Diet Order:  Diet clear liquid Room service appropriate?: No; Fluid consistency:: Thin  Skin:  Reviewed, no issues  Last BM:  PTA  Height:   Ht Readings from Last 1 Encounters:  08/21/14 5\' 10"  (1.778 m)    Weight:   Wt Readings from Last 1 Encounters:  08/22/14 224 lb 14.4 oz (102.014 kg)    Ideal Body Weight:  74.5 kg  Wt Readings from Last 10 Encounters:  08/22/14 224 lb 14.4 oz (102.014 kg)  08/07/14 227 lb 12 oz (103.307 kg)  07/23/14 230 lb 12.8 oz (104.69 kg)  07/09/14 230 lb (104.327 kg)  07/04/14 238 lb 4 oz (108.069 kg)  07/03/14 237 lb 6.4 oz (107.684 kg)  06/29/14 232 lb 4.8 oz  (105.371 kg)  06/20/14 240 lb (108.863 kg)  06/19/14 245 lb 4 oz (111.245 kg)  06/04/14 238 lb 6.4 oz (108.138 kg)    BMI:  Body mass index is 32.27 kg/(m^2).  Estimated Nutritional Needs:   Kcal:  2000-2200  Protein:  105-115 grams  Fluid:  2.0-2.2 L  EDUCATION NEEDS:   No education needs identified at this time  Graylon Amory A. Jimmye Norman, RD, LDN, CDE Pager: [phone] After hours Pager: [phone]

## 2014-08-23 ENCOUNTER — Encounter (HOSPITAL_COMMUNITY): Payer: Self-pay | Admitting: Dentistry

## 2014-08-23 DIAGNOSIS — K08109 Complete loss of teeth, unspecified cause, unspecified class: Secondary | ICD-10-CM

## 2014-08-23 LAB — CBC WITH DIFFERENTIAL/PLATELET
Basophils Absolute: 0 10*3/uL (ref 0.0–0.1)
Basophils Relative: 1 % (ref 0–1)
Eosinophils Absolute: 0.3 10*3/uL (ref 0.0–0.7)
Eosinophils Relative: 4 % (ref 0–5)
HCT: 33 % — ABNORMAL LOW (ref 39.0–52.0)
Hemoglobin: 10.5 g/dL — ABNORMAL LOW (ref 13.0–17.0)
LYMPHS ABS: 0.6 10*3/uL — AB (ref 0.7–4.0)
Lymphocytes Relative: 9 % — ABNORMAL LOW (ref 12–46)
MCH: 31.3 pg (ref 26.0–34.0)
MCHC: 31.8 g/dL (ref 30.0–36.0)
MCV: 98.5 fL (ref 78.0–100.0)
Monocytes Absolute: 1.1 10*3/uL — ABNORMAL HIGH (ref 0.1–1.0)
Monocytes Relative: 15 % — ABNORMAL HIGH (ref 3–12)
NEUTROS PCT: 71 % (ref 43–77)
Neutro Abs: 5.2 10*3/uL (ref 1.7–7.7)
Platelets: 257 10*3/uL (ref 150–400)
RBC: 3.35 MIL/uL — AB (ref 4.22–5.81)
RDW: 15.1 % (ref 11.5–15.5)
WBC: 7.2 10*3/uL (ref 4.0–10.5)

## 2014-08-23 LAB — GLUCOSE, CAPILLARY
Glucose-Capillary: 223 mg/dL — ABNORMAL HIGH (ref 65–99)
Glucose-Capillary: 195 mg/dL — ABNORMAL HIGH (ref 65–99)
Glucose-Capillary: 99 mg/dL (ref 65–99)
Glucose-Capillary: 132 mg/dL — ABNORMAL HIGH (ref 65–99)

## 2014-08-23 LAB — BASIC METABOLIC PANEL
ANION GAP: 6 (ref 5–15)
BUN: 34 mg/dL — ABNORMAL HIGH (ref 6–20)
CALCIUM: 8.7 mg/dL — AB (ref 8.9–10.3)
CHLORIDE: 101 mmol/L (ref 101–111)
CO2: 27 mmol/L (ref 22–32)
CREATININE: 1.99 mg/dL — AB (ref 0.61–1.24)
GFR calc Af Amer: 40 mL/min — ABNORMAL LOW (ref >60)
GFR calc non Af Amer: 34 mL/min — ABNORMAL LOW (ref >60)
Glucose, Bld: 222 mg/dL — ABNORMAL HIGH (ref 65–99)
Potassium: 4.4 mmol/L (ref 3.5–5.1)
SODIUM: 134 mmol/L — AB (ref 135–145)

## 2014-08-23 LAB — CARBOXYHEMOGLOBIN
Carboxyhemoglobin: 1.4 % (ref 0.5–1.5)
METHEMOGLOBIN: 1.1 % (ref 0.0–1.5)
O2 Saturation: 70.3 %
Total hemoglobin: 10.7 g/dL — ABNORMAL LOW (ref 13.5–18.0)

## 2014-08-23 LAB — MAGNESIUM: MAGNESIUM: 2.3 mg/dL (ref 1.7–2.4)

## 2014-08-23 MED ORDER — RIVAROXABAN 15 MG PO TABS
15.0000 mg | ORAL_TABLET | Freq: Every day | ORAL | Status: DC
  Administered 2014-08-23: 15 mg via ORAL
  Filled 2014-08-23 (×2): qty 1

## 2014-08-23 MED ORDER — HYDRALAZINE HCL 50 MG PO TABS
100.0000 mg | ORAL_TABLET | Freq: Three times a day (TID) | ORAL | Status: DC
  Administered 2014-08-23 – 2014-08-24 (×4): 100 mg via ORAL
  Filled 2014-08-23 (×6): qty 2

## 2014-08-23 MED ORDER — TORSEMIDE 20 MG PO TABS
40.0000 mg | ORAL_TABLET | Freq: Every day | ORAL | Status: DC
  Administered 2014-08-23: 40 mg via ORAL
  Filled 2014-08-23 (×2): qty 2

## 2014-08-23 MED ORDER — BACITRACIN ZINC 500 UNIT/GM EX OINT
TOPICAL_OINTMENT | Freq: Three times a day (TID) | CUTANEOUS | Status: DC
  Administered 2014-08-23 – 2014-08-24 (×4): via TOPICAL
  Filled 2014-08-23: qty 28.35

## 2014-08-23 MED ORDER — SILDENAFIL CITRATE 20 MG PO TABS
80.0000 mg | ORAL_TABLET | Freq: Three times a day (TID) | ORAL | Status: DC
  Administered 2014-08-23 – 2014-08-24 (×4): 80 mg via ORAL
  Filled 2014-08-23 (×6): qty 4

## 2014-08-23 MED ORDER — TORSEMIDE 20 MG PO TABS
60.0000 mg | ORAL_TABLET | Freq: Every day | ORAL | Status: DC
  Administered 2014-08-23 – 2014-08-24 (×2): 60 mg via ORAL
  Filled 2014-08-23 (×3): qty 3

## 2014-08-23 MED ORDER — WHITE PETROLATUM GEL
Status: AC
  Administered 2014-08-23: 0.2
  Filled 2014-08-23: qty 1

## 2014-08-23 MED ORDER — HYDROMORPHONE HCL 1 MG/ML IJ SOLN
1.0000 mg | INTRAMUSCULAR | Status: DC | PRN
  Administered 2014-08-23 (×2): 1 mg via INTRAVENOUS
  Filled 2014-08-23 (×2): qty 1

## 2014-08-23 NOTE — Progress Notes (Signed)
Saw that pt maybe dc on sat 7-16. Have alerted pam c w adv homecare that may be dc home. On home iv milrinone.

## 2014-08-23 NOTE — Progress Notes (Signed)
Advanced Heart Failure Rounding Note   Subjective:    Says he is overall feeling worse today but only in regards to his pain from the dental extraction as his numbing has worn off.  Getting pain meds q4hr.  Denies any SOB, CP, palpitations.  Doesn't have any positional lightheadedness or dizziness.   227 lbs -> 224 lbs this admission.   I/O + yesterday with IV fluid from procedure.  Volume status stable.  Coox 70% this morning. CVP 6  RHC Procedural Findings: Hemodynamics (mmHg) RA mean 14 RV 92/21 PA 91/34, mean 51 PCWP mean 27 Oxygen saturations: PA 55% AO 97% Cardiac Output (Fick) 4.75  Cardiac Index (Fick) 2.18 PVR 5.1 WU Cardiac Output (Thermo) 4.18 Cardiac Index (Thermo) 1.92 PVR 5.7 WU   Objective:   Weight Range: 224 lb 4.8 oz (101.742 kg) Body mass index is 32.18 kg/(m^2).   Vital Signs:   Temp:  [97.5 F (36.4 C)-98.2 F (36.8 C)] 98.1 F (36.7 C) (07/15 0400) Pulse Rate:  [71-86] 77 (07/15 0600) Resp:  [13-23] 18 (07/15 0600) BP: (128-153)/(51-68) 145/51 mmHg (07/15 0400) SpO2:  [91 %-99 %] 93 % (07/15 0600) Arterial Line BP: (135-200)/(58-103) 147/59 mmHg (07/15 0600) Weight:  [224 lb 4.8 oz (101.742 kg)] 224 lb 4.8 oz (101.742 kg) (07/15 0400) Last BM Date: 08/21/14  Weight change: Filed Weights   08/21/14 1845 08/22/14 0500 08/23/14 0400  Weight: 224 lb 13.9 oz (102 kg) 224 lb 14.4 oz (102.014 kg) 224 lb 4.8 oz (101.742 kg)    Intake/Output:   Intake/Output Summary (Last 24 hours) at 08/23/14 0717 Last data filed at 08/23/14 0600  Gross per 24 hour  Intake   1341 ml  Output   1025 ml  Net    316 ml     Physical Exam: General: NAD. Neuro: Alert and oriented X 3. Moves all extremities spontaneously. Psych: Flat affect, appropriate s/p dental extraction HEENT: Slightly swollen s/p extraction. Dried blood at corner of mouth Neck: Supple without bruits. JVP not appreciated, but pt seated on end of bed currently. Lungs: Resp regular and  unlabored. Bibasilar crackles, L > R.  Heart: RRR no s3, s4, or murmurs appreciated Abdomen: Soft, non-tender, non-distended, BS + x 4.  Extremities: No clubbing, cyanosis. 0-trace edema bilaterally. DP/PT/Radials 2+ and equal bilaterally.   Telemetry: SR  Labs: CBC  Recent Labs  08/22/14 0506 08/23/14 0500  WBC 5.1 7.2  NEUTROABS 3.4 5.2  HGB 9.7* 10.5*  HCT 30.3* 33.0*  MCV 99.7 98.5  PLT 235 106   Basic Metabolic Panel  Recent Labs  08/22/14 0500 08/23/14 0500  NA 131* 134*  K 3.7 4.4  CL 97* 101  CO2 26 27  GLUCOSE 255* 222*  BUN 39* 34*  CALCIUM 8.4* 8.7*   Liver Function Tests No results for input(s): AST, ALT, ALKPHOS, BILITOT, PROT, ALBUMIN in the last 72 hours. No results for input(s): LIPASE, AMYLASE in the last 72 hours. Cardiac Enzymes No results for input(s): CKTOTAL, CKMB, CKMBINDEX, TROPONINI in the last 72 hours.  BNP: BNP (last 3 results)  Recent Labs  05/22/14 1029 05/27/14 1126 06/19/14 1030  BNP 935.2* 1627.8* 738.9*    ProBNP (last 3 results)  Recent Labs  10/09/13 0926 11/14/13 0926 01/14/14 1112  PROBNP 1561.0* 1261.0* 5769.0*     D-Dimer No results for input(s): DDIMER in the last 72 hours. Hemoglobin A1C No results for input(s): HGBA1C in the last 72 hours. Fasting Lipid Panel No results for input(s): CHOL,  HDL, LDLCALC, TRIG, CHOLHDL, LDLDIRECT in the last 72 hours. Thyroid Function Tests No results for input(s): TSH, T4TOTAL, T3FREE, THYROIDAB in the last 72 hours.  Invalid input(s): FREET3  Other results:     Imaging/Studies:  Dg Chest Port 1 View  08/22/2014   CLINICAL DATA:  Pre operative respiratory exam.  EXAM: PORTABLE CHEST - 1 VIEW  COMPARISON:  05/27/2014  FINDINGS: AICD in place. PICC tip in the superior vena cava in good position. Heart size and pulmonary vascularity are normal. Lungs are clear. No acute osseous abnormality.  IMPRESSION: No acute abnormality.   Electronically Signed   By: Lorriane Shire M.D.   On: 08/22/2014 07:07    Latest Echo  Latest Cath   Medications:     Scheduled Medications: . amiodarone  200 mg Oral Daily  . antiseptic oral rinse  7 mL Mouth Rinse q12n4p  . antiseptic oral rinse  7 mL Mouth Rinse q12n4p  . atorvastatin  40 mg Oral Daily  . digoxin  0.0625 mg Oral Daily  . DULoxetine  60 mg Oral BID  . feeding supplement  1 Container Oral TID BM  . furosemide  80 mg Intravenous BID  . hydrALAZINE  75 mg Oral TID  . insulin aspart  30 Units Subcutaneous TID AC  . insulin glargine  32 Units Subcutaneous QHS  . levothyroxine  75 mcg Oral QAC breakfast  . metoprolol succinate  25 mg Oral BID AC  . multivitamin with minerals  1 tablet Oral Daily  . potassium chloride  20 mEq Oral BID  . senna  1 tablet Oral QHS  . sildenafil  60 mg Oral TID  . sodium chloride  10-40 mL Intracatheter Q12H  . sodium chloride  3 mL Intravenous Q12H  . spironolactone  25 mg Oral Daily    Infusions: . lactated ringers    . milrinone 0.5 mcg/kg/min (08/23/14 0240)    PRN Medications: sodium chloride, acetaminophen, ALPRAZolam, morphine injection, ondansetron (ZOFRAN) IV, oxyCODONE-acetaminophen **AND** oxyCODONE, sodium chloride, sodium chloride, zolpidem   Assessment/Plan   1. Chronic systolic CHF, Stable NYHA class III symptoms. NICM EF 20-25% with normal RV size and systolic function on 00/93 echo.  - Volume status stable - St Jude ICD has wide QRS  but no true LBBB pattern so benefit from CRT likely minimal - Milrinone increased to gtt 0.5 08/21/14 - In process of LVAD work up. - RHC yesterday continues to show severe Pulmonary hypertension with PVR > 5WU as below. - Continue current digoxin, Toprol XL, spironolactone, and hydralazine/Imdur - No ACEI/ARB with renal insufficiency. Was on Entresto. - CVP 6  Coox 70.3% - Will place back on home diuresis, slightly increased.  Torsemide 60 mg am 40 mg pm. - K 4.4 2. CKD, Stage 3 - Cr 2.1 -> 2.06 -> 1.99.  Improving - Will follow with diuresis  3. Atrial fibrillation: Paroxysmal.  - He is in NSRcurrently on Xarelto and amiodarone 200 mg daily.  4. DM2: - Continue home meds  5. Depression/anxiety:  6. OSA:  - Awaiting bipap at home. Will consult MD if anything can be started while inpatient.  Likely would not tolerate s/p dental extraction. 7. CAD:  -Nonobstructive on coronary angiography in 5/16.  - Continue statin.  8. Pulmonary hypertension:  - Mixed pulmonary venous hypertension and PAH - Increase Revatio to 80 tid  - Possibly add ERA with severe hypertension PVR > 5WU  Length of Stay: 2  Shirley Friar PA-C 08/23/2014,  7:17 AM  Advanced Heart Failure Team Pager [phone] (M-F; 7a - 4p)  Please contact [facility] for night-coverage after hours (4p -7a ) and weekends on [email or web address]  Patient seen with PA, agree with the above note.   Co-ox up to 70% on higher dose of milrinone.  CVP down to 6, creatinine down to 1.99.  He is tolerating sildenafil 60 tid, will increase to 80 mg tid today.  Will try to aggressively treat PAH as outpatient as well, will aim to add ERA.  BP higher today, likely related to mouth pain.  Can increase hydralazine to 100 tid for afterload reduction. Will transition to po torsemide, 60 qam/40 qpm.   No significant mouth bleeding, restart Xarelto 15 mg daily.   I am going to observe Mr Miklos in the hospital today, likely home tomorrow.  On RHC at time of admission, still with PAH (+PVH) and low output.  Reticent to plan LVAD with PA pressure so high.  Creatinine now better, however.  - Needs to be on Bipap => should be getting next week.  - Now on higher dose milrinone and titrated up Revatio.  Will try to initiate ERA as well as outpatient.  - After he is on this regimen, will aim to bring back in, place swan, see if he is optimized enough for LVAD placement.  Will discuss with colleagues.    Loralie Champagne 08/23/2014 8:41 AM

## 2014-08-23 NOTE — Progress Notes (Signed)
Pt. States he does not wear bipap. Pt. States he has issues with anxiety and being claustrophobic. Pt. Also has soreness around his mouth from having teeth pulled along with some stitches as stated by pt. Pt. Will just wear his oxygen tonight. RT informed pt. To notify if he changes his mind and decides to try the bipap.

## 2014-08-23 NOTE — Progress Notes (Signed)
POST OPERATIVE NOTE:  08/23/2014   Larry [name] [id number]  VITALS: BP 143/55 mmHg  Pulse 77  Temp(Src) 98.2 F (36.8 C) (Oral)  Resp 18  Ht 5\' 10"  (1.778 m)  Wt 224 lb 4.8 oz (101.742 kg)  BMI 32.18 kg/m2  SpO2 93%  LABS:  Lab Results  Component Value Date   WBC 7.2 08/23/2014   HGB 10.5* 08/23/2014   HCT 33.0* 08/23/2014   MCV 98.5 08/23/2014   PLT 257 08/23/2014   BMET    Component Value Date/Time   NA 134* 08/23/2014 0500   K 4.4 08/23/2014 0500   CL 101 08/23/2014 0500   CO2 27 08/23/2014 0500   GLUCOSE 222* 08/23/2014 0500   BUN 34* 08/23/2014 0500   CREATININE 1.99* 08/23/2014 0500   CALCIUM 8.7* 08/23/2014 0500   GFRNONAA 34* 08/23/2014 0500   GFRAA 40* 08/23/2014 0500    Lab Results  Component Value Date   INR 2.69* 05/28/2014   INR 2.08* 05/08/2014   INR 1.47 02/01/2014   No results found for: PTT   Larry [name] is status post extraction of remaining teeth with alveoloplasty and pre-prosthetic surgery as indicated in the operating room with general anesthesia.  SUBJECTIVE: Patient is complaining of oral discomfort after the dental extractions. Patient denies having any significant bleeding. Lips are chapped.  EXAM: There is no sign of infection, heme, or ooze. Sutures are intact. Clots are present. Patient has intraoral and extraoral ecchymoses and swelling consistent with dental surgery. Lips are dry,chapped.   ASSESSMENT: Post operative course is consistent with dental procedures performed in the operating room. Patient is now edentulous. Patient with nutritional consultation scheduled for advancement to full liquids and soft diet as indicated   PLAN: 1. Chlorhexidine gluconate rinses twice daily after breakfast and at bedtime. 2. Salt water rinses every 2 hours in between the chlorhexidine rinses. 3. Advance diet as tolerated with assistance from nutritional consultation 4. Return to dental medicine for evaluation for suture  removal in 7-10 days.  5. Discharge per cardiology at their discretion. 6. Cardiology to provide prescription for pain medication at time of discharge: Usual prescription for dental extractions is Percocet 5/325:1-2 tablets every 4-6 hours as needed for pain dispense #40.   Lenn Cal, DDS

## 2014-08-24 ENCOUNTER — Encounter (HOSPITAL_COMMUNITY): Payer: Self-pay | Admitting: Physician Assistant

## 2014-08-24 DIAGNOSIS — N183 Chronic kidney disease, stage 3 (moderate): Secondary | ICD-10-CM

## 2014-08-24 LAB — CARBOXYHEMOGLOBIN
Carboxyhemoglobin: 1.5 % (ref 0.5–1.5)
Methemoglobin: 1.1 % (ref 0.0–1.5)
O2 Saturation: 65.4 %
TOTAL HEMOGLOBIN: 11.3 g/dL — AB (ref 13.5–18.0)

## 2014-08-24 LAB — CBC WITH DIFFERENTIAL/PLATELET
BASOS PCT: 1 % (ref 0–1)
Basophils Absolute: 0.1 10*3/uL (ref 0.0–0.1)
Eosinophils Absolute: 0.2 10*3/uL (ref 0.0–0.7)
Eosinophils Relative: 2 % (ref 0–5)
HEMATOCRIT: 33.3 % — AB (ref 39.0–52.0)
HEMOGLOBIN: 10.8 g/dL — AB (ref 13.0–17.0)
LYMPHS ABS: 0.7 10*3/uL (ref 0.7–4.0)
Lymphocytes Relative: 8 % — ABNORMAL LOW (ref 12–46)
MCH: 32 pg (ref 26.0–34.0)
MCHC: 32.4 g/dL (ref 30.0–36.0)
MCV: 98.8 fL (ref 78.0–100.0)
MONO ABS: 1.1 10*3/uL — AB (ref 0.1–1.0)
MONOS PCT: 12 % (ref 3–12)
NEUTROS PCT: 77 % (ref 43–77)
Neutro Abs: 7.2 10*3/uL (ref 1.7–7.7)
Platelets: 285 10*3/uL (ref 150–400)
RBC: 3.37 MIL/uL — AB (ref 4.22–5.81)
RDW: 14.9 % (ref 11.5–15.5)
WBC: 9.3 10*3/uL (ref 4.0–10.5)

## 2014-08-24 LAB — GLUCOSE, CAPILLARY
GLUCOSE-CAPILLARY: 266 mg/dL — AB (ref 65–99)
GLUCOSE-CAPILLARY: 135 mg/dL — AB (ref 65–99)
Glucose-Capillary: 215 mg/dL — ABNORMAL HIGH (ref 65–99)

## 2014-08-24 LAB — BASIC METABOLIC PANEL
ANION GAP: 7 (ref 5–15)
ANION GAP: 8 (ref 5–15)
BUN: 31 mg/dL — ABNORMAL HIGH (ref 6–20)
BUN: 31 mg/dL — ABNORMAL HIGH (ref 6–20)
CALCIUM: 8.8 mg/dL — AB (ref 8.9–10.3)
CO2: 26 mmol/L (ref 22–32)
CO2: 27 mmol/L (ref 22–32)
CREATININE: 1.87 mg/dL — AB (ref 0.61–1.24)
Calcium: 8.9 mg/dL (ref 8.9–10.3)
Chloride: 98 mmol/L — ABNORMAL LOW (ref 101–111)
Chloride: 95 mmol/L — ABNORMAL LOW (ref 101–111)
Creatinine, Ser: 1.81 mg/dL — ABNORMAL HIGH (ref 0.61–1.24)
GFR calc Af Amer: 43 mL/min — ABNORMAL LOW (ref >60)
GFR calc Af Amer: 45 mL/min — ABNORMAL LOW (ref >60)
GFR calc non Af Amer: 37 mL/min — ABNORMAL LOW (ref >60)
GFR, EST NON AFRICAN AMERICAN: 38 mL/min — AB (ref >60)
GLUCOSE: 232 mg/dL — AB (ref 65–99)
GLUCOSE: 280 mg/dL — AB (ref 65–99)
Potassium: 4.7 mmol/L (ref 3.5–5.1)
Potassium: 4.6 mmol/L (ref 3.5–5.1)
SODIUM: 130 mmol/L — AB (ref 135–145)
Sodium: 131 mmol/L — ABNORMAL LOW (ref 135–145)

## 2014-08-24 MED ORDER — TORSEMIDE 20 MG PO TABS: 60.0000 mg | ORAL_TABLET | Freq: Two times a day (BID) | ORAL | Status: DC

## 2014-08-24 MED ORDER — HYDRALAZINE HCL 100 MG PO TABS: 100.0000 mg | ORAL_TABLET | Freq: Three times a day (TID) | ORAL | Status: DC

## 2014-08-24 MED ORDER — MILRINONE IN DEXTROSE 20 MG/100ML IV SOLN: 0.5000 ug/kg/min | INTRAVENOUS | Status: DC

## 2014-08-24 MED ORDER — TORSEMIDE 20 MG PO TABS
60.0000 mg | ORAL_TABLET | Freq: Two times a day (BID) | ORAL | Status: DC
  Filled 2014-08-24 (×2): qty 3

## 2014-08-24 MED ORDER — CHLORHEXIDINE GLUCONATE 0.12 % MT SOLN: OROMUCOSAL | Status: AC

## 2014-08-24 MED ORDER — OXYCODONE-ACETAMINOPHEN 5-325 MG PO TABS: ORAL_TABLET | ORAL | Status: DC

## 2014-08-24 MED ORDER — SILDENAFIL CITRATE 20 MG PO TABS: 80.0000 mg | ORAL_TABLET | Freq: Three times a day (TID) | ORAL | Status: AC

## 2014-08-24 NOTE — Progress Notes (Signed)
[facility]  Active  Patient with [facility]. Mr. Nace is on home Milrinone with [facility]. Note:  VM message left for family that they will need to bring his pump and pouch as well as the bag of Milrinone at home to the hospital to hook up patient prior to DC home when ordered.  Attempted to get orders on Friday for weekend DC, but unable to obtain.  [facility] will need resumption of Milrinone script. Thank you.  If patient discharges after hours, please call [phone].   Larry Sierras 08/24/2014, 8:46 AM

## 2014-08-24 NOTE — Progress Notes (Signed)
Patient ID: Larry [name], male   DOB: 06-21-1950, 64 y.o.   MRN: [id number]  Advanced Heart Failure Rounding Note   Subjective:    Stable today.  Still with mouth pain s/p extractions.  Revatio titrated up to 80 mg tid and milrinone now up to 0.5.  Co-ox 65% today and CVP 8. Noted to desaturate with ambulation.  Creatinine down to 1.87.   RHC Procedural Findings: Hemodynamics (mmHg) RA mean 14 RV 92/21 PA 91/34, mean 51 PCWP mean 27 Oxygen saturations: PA 55% AO 97% Cardiac Output (Fick) 4.75  Cardiac Index (Fick) 2.18 PVR 5.1 WU Cardiac Output (Thermo) 4.18 Cardiac Index (Thermo) 1.92 PVR 5.7 WU   Objective:   Weight Range: 225 lb 12 oz (102.4 kg) Body mass index is 32.39 kg/(m^2).   Vital Signs:   Temp:  [97.5 F (36.4 C)-98.7 F (37.1 C)] 97.9 F (36.6 C) (07/16 0750) Pulse Rate:  [74-91] 77 (07/16 0750) Resp:  [16-22] 20 (07/16 0750) BP: (132-162)/(52-62) 162/60 mmHg (07/16 0750) SpO2:  [88 %-99 %] 92 % (07/16 0750) Arterial Line BP: (147)/(61) 147/61 mmHg (07/15 1100) Weight:  [225 lb 12 oz (102.4 kg)] 225 lb 12 oz (102.4 kg) (07/16 0500) Last BM Date: 08/21/14  Weight change: Filed Weights   08/22/14 0500 08/23/14 0400 08/24/14 0500  Weight: 224 lb 14.4 oz (102.014 kg) 224 lb 4.8 oz (101.742 kg) 225 lb 12 oz (102.4 kg)    Intake/Output:   Intake/Output Summary (Last 24 hours) at 08/24/14 0908 Last data filed at 08/24/14 0800  Gross per 24 hour  Intake 1354.2 ml  Output   1150 ml  Net  204.2 ml     Physical Exam: General: NAD. Neuro: Alert and oriented X 3. Moves all extremities spontaneously. Psych: Flat affect, appropriate s/p dental extraction HEENT: Slightly swollen s/p extraction. Dried blood at corner of mouth Neck: Supple without bruits. No JVD.  Lungs: Resp regular and unlabored. Slight crackles at bases bilaterally.   Heart: RRR no s3, s4, or murmurs appreciated Abdomen: Soft, non-tender, non-distended, BS + x 4.  Extremities:  No clubbing, cyanosis. No edema. DP/PT/Radials 2+ and equal bilaterally.   Telemetry: SR  Labs: CBC  Recent Labs  08/23/14 0500 08/24/14 0530  WBC 7.2 9.3  NEUTROABS 5.2 7.2  HGB 10.5* 10.8*  HCT 33.0* 33.3*  MCV 98.5 98.8  PLT 257 678   Basic Metabolic Panel  Recent Labs  08/23/14 0420 08/23/14 0500 08/24/14 0530  NA  --  134* 131*  K  --  4.4 4.7  CL  --  101 98*  CO2  --  27 26  GLUCOSE  --  222* 232*  BUN  --  34* 31*  CALCIUM  --  8.7* 8.9  MG 2.3  --   --    Liver Function Tests No results for input(s): AST, ALT, ALKPHOS, BILITOT, PROT, ALBUMIN in the last 72 hours. No results for input(s): LIPASE, AMYLASE in the last 72 hours. Cardiac Enzymes No results for input(s): CKTOTAL, CKMB, CKMBINDEX, TROPONINI in the last 72 hours.  BNP: BNP (last 3 results)  Recent Labs  05/22/14 1029 05/27/14 1126 06/19/14 1030  BNP 935.2* 1627.8* 738.9*    ProBNP (last 3 results)  Recent Labs  10/09/13 0926 11/14/13 0926 01/14/14 1112  PROBNP 1561.0* 1261.0* 5769.0*     D-Dimer No results for input(s): DDIMER in the last 72 hours. Hemoglobin A1C No results for input(s): HGBA1C in the last 72 hours. Fasting Lipid Panel  No results for input(s): CHOL, HDL, LDLCALC, TRIG, CHOLHDL, LDLDIRECT in the last 72 hours. Thyroid Function Tests No results for input(s): TSH, T4TOTAL, T3FREE, THYROIDAB in the last 72 hours.  Invalid input(s): FREET3  Other results:     Imaging/Studies:  No results found.  Latest Echo  Latest Cath   Medications:     Scheduled Medications: . amiodarone  200 mg Oral Daily  . antiseptic oral rinse  7 mL Mouth Rinse q12n4p  . antiseptic oral rinse  7 mL Mouth Rinse q12n4p  . atorvastatin  40 mg Oral Daily  . bacitracin   Topical TID  . digoxin  0.0625 mg Oral Daily  . DULoxetine  60 mg Oral BID  . feeding supplement  1 Container Oral TID BM  . hydrALAZINE  100 mg Oral TID  . insulin aspart  30 Units Subcutaneous TID AC   . insulin glargine  32 Units Subcutaneous QHS  . levothyroxine  75 mcg Oral QAC breakfast  . metoprolol succinate  25 mg Oral BID AC  . multivitamin with minerals  1 tablet Oral Daily  . potassium chloride  20 mEq Oral BID  . rivaroxaban  15 mg Oral Q supper  . senna  1 tablet Oral QHS  . sildenafil  80 mg Oral TID  . sodium chloride  10-40 mL Intracatheter Q12H  . sodium chloride  3 mL Intravenous Q12H  . spironolactone  25 mg Oral Daily  . torsemide  60 mg Oral BID    Infusions: . lactated ringers    . milrinone 0.5 mcg/kg/min (08/24/14 0548)    PRN Medications: sodium chloride, acetaminophen, ALPRAZolam, HYDROmorphone (DILAUDID) injection, morphine injection, ondansetron (ZOFRAN) IV, oxyCODONE-acetaminophen **AND** oxyCODONE, sodium chloride, sodium chloride, zolpidem   Assessment/Plan   1. Chronic systolic CHF:  Stable NYHA class III symptoms. NICM with EF 20-25% and normal RV size and systolic function on 90/24 echo. St Jude ICD has wide QRS  but no true LBBB pattern so benefit from CRT likely minimal.  Has been on home milrinone.  Admitted for RHC to evaluate for LVAD as well as teeth removal by Dr Enrique Sack.  Meds adjusted post-cath, increased milrinone, Revatio, hydralazine, and torsemide.  Today, CVP 8 and co-ox 65%.  - Continue milrinone 0.5.  - Continue current digoxin, Toprol XL, spironolactone, and hydralazine.  - No ACEI/ARB with renal insufficiency.  - Continue torsemide 60 mg bid (increased from home dose).  - Will need to continue the current regimen at home.  Need to bring down PA pressure prior to LVAD placement.  Think renal function is going to be ok.  May add ERA as outpatient.  We will discuss him Monday in [place].  May need to bring back in yet again for RHC/swan when meds are optimized to re-evaluate, arrange possible LVAD placement.   2. CKD, Stage 3: Creatinine down to 1.87 today.  Think renal function will not be limiting for LVAD.  3. Atrial  fibrillation: Paroxysmal. He is in NSRcurrently on Xarelto and amiodarone 200 mg daily.  4. DM2: Continue home meds  5. Depression/anxiety:  6. OSA: Awaiting bipap at home. Too much mouth pain to use last night.  Needs to get on this prior to next cath (severe OSA).  He also will need oxygen for exertion while at home (desaturates currently with exertion, may be related to pain meds for mouth).  7. 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 Continue statin.  8. Pulmonary hypertension: Mixed pulmonary venous hypertension  and PAH.  This is going to be an issue for LVAD, concerned about eventual RV failure if this cannot be brought down.  - Increased Revatio to 80 tid.  Will need to continue this dose as outpatient.  - Possibly add ERA with severe hypertension PVR > 5WU 9. Dental extractions: May send home on pain meds per dental note from yesterday. 10. Disposition: Home today.  Will need home health for IV milrinone (was on prior to admission but need to increase).  He will need home oxygen for exertion (at least for now).  Needs followup with me in 10 days in CHF clinic.  Meds for discharge: milrinone 0.5 mcg/kg/min, torsemide 60 mg bid, Revatio 80 mg tid, hydralazine 100 mg tid, amiodarone 200 daily, atorvastatin 40 daily, digoxin 0.0625 daily, KCl 20 bid, Toprol XL 25 bid, Xarelto 15 daily, spironolactone 25 daily.   Length of Stay: 3  Loralie Champagne 08/24/2014, 9:08 AM  Advanced Heart Failure Team Pager [phone] (M-F; 7a - 4p)  Please contact [facility] for night-coverage after hours (4p -7a ) and weekends on [email or web address]

## 2014-08-24 NOTE — Progress Notes (Signed)
SATURATION QUALIFICATIONS: (This note is used to comply with regulatory documentation for home oxygen)  Patient Saturations on Room Air at Rest = 90-94%  Patient Saturations on Room Air while Ambulating = 85%  Patient Saturations on 2 Liters of oxygen while Ambulating = 90-94%  Please briefly explain why patient needs home oxygen: Patient desaturates 85 with activity. Patient needs oxygen with activity.  Roxan Hockey, RN

## 2014-08-24 NOTE — Progress Notes (Signed)
Care management called to help assist with home oxygen and home [facility] needs.   Roxan Hockey, RN

## 2014-08-24 NOTE — Care Management Note (Signed)
Case Management Note  Patient Details  Name: Larry [name] MRN: [id number] Date of Birth: 07/02/1950  Subjective/Objective:                  Chronic systolic CHF  Action/Plan:  Discharge planning Expected Discharge Date:  08/24/14               Expected Discharge Plan:  [place]  In-House Referral:     Discharge planning Services  CM Consult  Post Acute Care Choice:  Resumption of Svcs/PTA Provider, Home Health Choice offered to:     DME Arranged:  Oxygen, IV pump/equipment DME Agency:  [place]:  RN, Disease Management [place] Agency:  [place]  Status of Service:     Medicare Important Message Given:    Date Medicare IM Given:    Medicare IM give by:    Date Additional Medicare IM Given:    Additional Medicare Important Message give by:     If discussed at [place] of Stay Meetings, dates discussed:    Additional Comments: CM received call from RN to please arrange milrinone resumption.   CM requested new prescription, orders for [facility] resumption, orders and pulmonary saturation note for home O2.  CM called [facility] rep, Tiffany to set up RN for in hospital hook up.  CM called daughter of pt, Ms. Larry [name] to please bring the milrinone to room for hook up.  CM called [facility] DME rep, Germaine to please deliver O2 to room for discharge. CM faxed new prescription with today's wt to [facility] pharmacy.  No other CM needs were communicated.  Dellie Catholic, RN 08/24/2014, 12:17 PM

## 2014-08-24 NOTE — Progress Notes (Signed)
Went over discharge instructions with patient and wife. Educated on medications, follow up appointments, and oxygen tank. Patient will go home with home O2 with activity. [facility] dropped off portable tank. [place] nurse from [facility] came to switch patient over to their milrinone IV equipment. p IV removed. PICC remains in place. Patient taken off the monitor. Patient discharged home with family.   Roxan Hockey, RN

## 2014-08-24 NOTE — Discharge Summary (Signed)
Discharge Summary   Patient ID: Larry [name] MRN: [id number], DOB/AGE: 1950-06-28 64 y.o. Admit date: 08/21/2014 D/C date:     08/24/2014  Primary Care Provider: [name], MD Primary Cardiologist: CHF clinic  Primary Discharge Diagnoses:  1. Chronic systolic CHF 2. CKD stage III 3. Paroxysmal atrial fibrillation requiring DCCV 02/2014, 04/2014 4. Diabetes mellitus type 2 5. Depression/anxiety 6. OSA, complex 7. Pulmonary HTN - Mixed pulmonary venous hypertension and PAH 8. Dental extractions this admission 9. Obesity Body mass index is 32.39 kg/(m^2). 10. Probable chronic respiratory failure - requiring home O2 with ambulation as of this admission  PMH:  Past Medical History  Diagnosis Date  . NICM (nonischemic cardiomyopathy)     a. NICM. EF 2011 25-30%. b. LHC 2009 nonobstructive CAD. c. h/o St. Jude ICD 2012. d. Echo 04/2013 - EF 25-30% with mildly decreased RV systolic function. e. Echo 01/2014: EF 20-25%, inf akinesis, RHC. e. Home milrinone started 4/16.  [name] CAD (coronary artery disease)     a. nonobstructive CAD by cath 2009 & 06/2014.  . Obesity   . CVA (cerebral vascular accident)     CVA 2007 without residual deficit  . 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 Depression   . Erectile dysfunction   . DDD (degenerative disc disease)     Chronic low back pain.   . OSA (obstructive sleep apnea)     a. 4/16 sleep study with severe OSA  . Monoclonal gammopathy   . CKD (chronic kidney disease), stage III   . 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 Hypogonadotropic hypogonadism   . Polycythemia, secondary     improved/resolved  . Hypercholesterolemia   . Back pain     F/B [name]  . Monoclonal gammopathy of unknown significance     per [name]  . Neck pain     F/B [name]  . PAF (paroxysmal atrial fibrillation) 06/04/10    a. On Xarelto. b. DCCV  02/2014, 04/2014 to NSR.  [name] History of diverticulitis of colon   . Type II or unspecified type diabetes mellitus with neurological manifestations, uncontrolled   . Nonproliferative diabetic retinopathy NOS(362.03)   . Ventricular tachycardia 10/25/13    appropriate ICD shock, VT CL 230-240 msec  . Confusion 02/01/2014  . Hypertension   . AICD (automatic cardioverter/defibrillator) present   . Anxiety   . Peripheral vascular disease     2007  . Chronic systolic CHF (congestive heart failure)   . Hypothyroidism   . PTSD (post-traumatic stress disorder)     a. Anxiety/PTSD from ICD shock  . Lupus anticoagulant positive     a. No h/o DVT. Saw Dr Lindi Adie with hematology.   . Pulmonary hypertension     a. Mixed pulmonary venous hypertension and PAH  . Chronic respiratory failure     a. As of 08/2014 requiring home O2 with ambulation due to desat.     Hospital Course: Larry [name] is a 64 y/o man with history of chronic systolic CHF (nonischemic cardiomyopathy) s/p St. Jude ICD, nonobstructive CAD by cath in 2009 & 06/2014, CKD, prior CVA, paroxysmal atrial fibrillation, and pulmonary HTN who was admitted 7/13-7/15.Patient says that he has been known to have a low EF for years. Last echo in 12/15 showed EF 20-25% with normal RV size and systolic function.RHC 8/15 showing low cardiac output, mildly elevated PCWP and primarily  pulmonary venous hypertension.He was transitioned from Lasix to torsemide and started on digoxin.Entresto was started and valsartan stopped. Adenosine Cardiolite was done in 12/15 showing large area of apical scar but no ischemia. This raised concern for possible ischemic cardiomyopathy. He was admitted later in 12/15 with acute on chronic systolic CHF. He also was noted to have gone into atrial fibrillation during this hospitalization, amiodarone was started in preparation for possible DCCV. Troponin was mildly elevated in the hospital in the absence of chest pain, likely  demand ischemia. He was diuresed with IV Lasix and was discharged soon after. He was cardioverted back to NSR on 02/14/14. He was cardioverted again to NSR in 3/16. He had RHC done 05/08/14 with mixed pulmonary hypertension and PWCP 25, CI 1.75. CPX done 4/16 showed severe functional limitation. In 4/16, he was started on home milrinone via PICC at 0.25 mcg/kg/min. Co-ox increased from 55% => 72%. Entresto stopped with elevated creatinine.   Larry [name] initially remained breathless with NYHA class III symptoms even after starting milrinone. He came back for LHC/RHC in 5/16. This showed nonobstructive CAD and elevated filling pressures including severe pulmonary hypertension. CI was 1.95 by Fick. We increased milrinone to 0.375, started him on Revatio, and diuresed him.   Dr. Aundra Dubin has been in recent discussions with him regarding LVAD. He arranged for admission for RHC and dental extractions. He was brought in for this on 08/21/14 (this admission) with findings below:  RHC Procedural Findings: Hemodynamics (mmHg) RA mean 14 RV 92/21 PA 91/34, mean 51 PCWP mean 27 Oxygen saturations: PA 55% AO 97% Cardiac Output (Fick) 4.75  Cardiac Index (Fick) 2.18 PVR 5.1 WU Cardiac Output (Thermo) 4.18 Cardiac Index (Thermo) 1.92 PVR 5.7 WU  It was felt that he required further med titration thus was placed on Lasix 80mg  IV BID, increased Revatio, and increased milrinone to 0.29mcg/kg/min. On 08/22/14 he underwent multiple extraction of tooth numbers 3, 5, 6, 7, 9, 10, 11, 12, 13, 14, 20, 21, 22, 23, 27, 28, 29, and 30, 4 Quadrants of alveoloplasty, and bilateral mandibular lingual tori reductions. By 08/23/14, his CVP and Cr were improving thus he was transitioned to oral torsemide. Today it is down to 1.81. He is noted to desat with ambulation down to 85% with activity thus will require 2L O2 with ambulation at home. Rest O2 sats 90-94%. Of note he is also currently already awaiting bipap arrangement  at home as well. Dr. Aundra Dubin recommends to continue the med changes that have occurred in-house, which include titration of hydralazine, milrinone, revatio, torsemide. Will need to bring down PA pressure prior to LVAD placement.He may add ERA as outpatient. He plans to discuss the patient Monday in [place] and may need to bring back in yet again for RHC/swan when meds are optimized to re-evaluate, arrange possible LVAD placement.Barriers to LVAD at this point are pulmonary HTN (as above), elevated creatinine (seems to be improving with milrinone), and chronic back pain. He will need follow-up in 10 days in CHF clinic. I have sent a message to CHF clinic requesting a follow-up appointment, and the office will call the patient with this information. Dr. Aundra Dubin has seen and examined the patient today and feels he is stable for discharge.  Dr. Enrique Sack left the following instructions, and also placed d/c instructions in the patient's AVS:  1. Chlorhexidine gluconate rinses twice daily after breakfast and at bedtime. 2. Salt water rinses every 2 hours in between the chlorhexidine rinses. 3. Advance diet  as tolerated with assistance from nutritional consultation 4. Return to dental medicine for evaluation for suture removal in 7-10 days. (he left office information on the chart for the patient to call) 5. Discharge per cardiology at their discretion. 6. Cardiology to provide prescription for pain medication at time of discharge: Usual prescription for dental extractions is Percocet 5/325:1-2 tablets every 4-6 hours as needed for pain dispense #40.  Discharge Vitals: Blood pressure 142/60, pulse 74, temperature 98 F (36.7 C), temperature source Oral, resp. rate 21, height 5\' 10"  (1.778 m), weight 225 lb 12 oz (102.4 kg), SpO2 92 %.  Labs: Lab Results  Component Value Date   WBC 9.3 08/24/2014   HGB 10.8* 08/24/2014   HCT 33.3* 08/24/2014   MCV 98.8 08/24/2014   PLT 285 08/24/2014    Recent  Labs Lab 08/24/14 1000  NA 130*  K 4.6  CL 95*  CO2 27  BUN 31*  CREATININE 1.81*  CALCIUM 8.8*  GLUCOSE 280*    Lab Results  Component Value Date   CHOL 92 05/28/2014   HDL 28* 05/28/2014   LDLCALC 37 05/28/2014   TRIG 137 05/28/2014    Diagnostic Studies/Procedures   Dg Chest Port 1 View  08/22/2014   CLINICAL DATA:  Pre operative respiratory exam.  EXAM: PORTABLE CHEST - 1 VIEW  COMPARISON:  05/27/2014  FINDINGS: AICD in place. PICC tip in the superior vena cava in good position. Heart size and pulmonary vascularity are normal. Lungs are clear. No acute osseous abnormality.  IMPRESSION: No acute abnormality.   Electronically Signed   By: Lorriane Shire M.D.   On: 08/22/2014 07:07    RHC this admission - see above  Dental extractions - see op report  Discharge Medications   Current Discharge Medication List    START taking these medications   Details  chlorhexidine (PERIDEX) 0.12 % solution Perform mouth rinses twice daily after breakfast and at bedtime. Qty: 120 mL, Refills: 0    oxyCODONE-acetaminophen (PERCOCET/ROXICET) 5-325 MG per tablet Take 1-2 tablets by mouth every 4-6 hours as needed for moderate-severe pain. Qty: 40 tablet, Refills: 0      CONTINUE these medications which have CHANGED   Details  hydrALAZINE (APRESOLINE) 100 MG tablet Take 1 tablet (100 mg total) by mouth 3 (three) times daily. Qty: 90 tablet, Refills: 1    milrinone (PRIMACOR) 20 MG/100ML SOLN infusion Inject 51.2 mcg/min into the vein continuous. (0.28mcg/kg/min)    sildenafil (REVATIO) 20 MG tablet Take 4 tablets (80 mg total) by mouth 3 (three) times daily. Qty: 360 tablet, Refills: 1    torsemide (DEMADEX) 20 MG tablet Take 3 tablets (60 mg total) by mouth 2 (two) times daily. Qty: 180 tablet, Refills: 1   Associated Diagnoses: Chronic systolic heart failure; Paroxysmal atrial fibrillation      CONTINUE these medications which have NOT CHANGED   Details  ALPRAZolam (XANAX)  0.5 MG tablet Take 0.5 mg by mouth 3 (three) times daily as needed for anxiety.    amiodarone (PACERONE) 200 MG tablet Take 1 tablet (200 mg total) by mouth daily.     atorvastatin (LIPITOR) 40 MG tablet TAKE 1 TABLET DAILY     digoxin (LANOXIN) 0.125 MG tablet Take 0.5 tablets (0.0625 mg total) by mouth daily.     DULoxetine (CYMBALTA) 30 MG capsule Take 60 mg by mouth 2 (two) times daily.     insulin aspart (NOVOLOG) 100 UNIT/ML injection Inject 30 Units into the skin 3 (three) times  daily before meals.     LANTUS SOLOSTAR 100 UNIT/ML Solostar Pen Inject 32 Units into the skin at bedtime.     levothyroxine (SYNTHROID, LEVOTHROID) 25 MCG tablet Take 75 mcg by mouth daily before breakfast.      metoprolol succinate (TOPROL-XL) 25 MG 24 hr tablet Take 1 tablet (25 mg total) by mouth 2 (two) times daily before a meal.     Multiple Vitamin (MULITIVITAMIN WITH MINERALS) TABS Take 1 tablet by mouth daily.    Potassium Chloride ER 20 MEQ TBCR Take 20 mEq by mouth 2 (two) times daily.     rivaroxaban (XARELTO) 15 MG TABS tablet Take 1 tablet (15 mg total) by mouth daily with supper.     senna (SENOKOT) 8.6 MG tablet Take 1 tablet by mouth at bedtime.     spironolactone (ALDACTONE) 25 MG tablet TAKE 1 TABLET DAILY     zolpidem (AMBIEN) 5 MG tablet Take 1 tablet (5 mg total) by mouth at bedtime as needed for sleep.     GLUCAGON EMERGENCY 1 MG injection Inject 1 mg as directed once. For hypotension    Melatonin 10 MG TABS Take 10 mg by mouth at bedtime.      STOP taking these medications     oxyCODONE-acetaminophen (PERCOCET) 10-325 MG per tablet - duplicate (see rx above)         Disposition   The patient will be discharged in stable condition to home. Discharge Instructions    Diet - low sodium heart healthy    Complete by:  As directed      Increase activity slowly    Complete by:  As directed   No lifting over 5 lbs for 1 week. No sexual activity for 1 week.  Keep  procedure site clean & dry. If you notice increased pain, swelling, bleeding or pus, call/return!  You may shower, but no soaking baths/hot tubs/pools for 1 week.   Your dentist gives you the following instructions: 1. Chlorhexidine gluconate rinses twice daily after breakfast and at bedtime. 2. Salt water rinses every 2 hours in between the chlorhexidine rinses. 3. Advance diet as tolerated.          Follow-up Information    Follow up with Lenn Cal, DDS. Schedule an appointment as soon as possible for a visit on 09/02/2014.   Specialty:  Dentistry   Why:  For suture removal   Contact information:   [place] [address] [phone]       Follow up with Loralie Champagne, MD.   Specialty:  Cardiology   Why:  Office will call you for your followup appointment. Call office if you have not heard back in 3 days.   Contact information:   [address] [place] [address] [phone]         Duration of Discharge Encounter: Greater than 30 minutes including physician and PA time.  Rudean Hitt, Aviendha Azbell PA-C 08/24/2014, 12:02 PM

## 2014-08-25 ENCOUNTER — Inpatient Hospital Stay (HOSPITAL_COMMUNITY): Payer: Commercial Managed Care - HMO

## 2014-08-25 ENCOUNTER — Inpatient Hospital Stay (HOSPITAL_COMMUNITY)
Admission: EM | Admit: 2014-08-25 | Discharge: 2014-10-11 | DRG: 480 | Disposition: A | Discharge status: Skilled Nursing Facility | Payer: Commercial Managed Care - HMO | Attending: Internal Medicine | Admitting: Internal Medicine

## 2014-08-25 ENCOUNTER — Emergency Department (HOSPITAL_COMMUNITY): Payer: Commercial Managed Care - HMO

## 2014-08-25 ENCOUNTER — Encounter (HOSPITAL_COMMUNITY): Payer: Self-pay | Admitting: Emergency Medicine

## 2014-08-25 ENCOUNTER — Other Ambulatory Visit: Payer: Self-pay

## 2014-08-25 ENCOUNTER — Other Ambulatory Visit (HOSPITAL_COMMUNITY): Payer: Self-pay

## 2014-08-25 DIAGNOSIS — I739 Peripheral vascular disease, unspecified: Secondary | ICD-10-CM | POA: Diagnosis present

## 2014-08-25 DIAGNOSIS — Z791 Long term (current) use of non-steroidal anti-inflammatories (NSAID): Secondary | ICD-10-CM

## 2014-08-25 DIAGNOSIS — I2510 Atherosclerotic heart disease of native coronary artery without angina pectoris: Secondary | ICD-10-CM | POA: Diagnosis present

## 2014-08-25 DIAGNOSIS — E861 Hypovolemia: Secondary | ICD-10-CM | POA: Diagnosis not present

## 2014-08-25 DIAGNOSIS — Z8673 Personal history of transient ischemic attack (TIA), and cerebral infarction without residual deficits: Secondary | ICD-10-CM

## 2014-08-25 DIAGNOSIS — E1165 Type 2 diabetes mellitus with hyperglycemia: Secondary | ICD-10-CM

## 2014-08-25 DIAGNOSIS — I482 Chronic atrial fibrillation, unspecified: Secondary | ICD-10-CM | POA: Insufficient documentation

## 2014-08-25 DIAGNOSIS — Z515 Encounter for palliative care: Secondary | ICD-10-CM | POA: Diagnosis not present

## 2014-08-25 DIAGNOSIS — I10 Essential (primary) hypertension: Secondary | ICD-10-CM | POA: Diagnosis not present

## 2014-08-25 DIAGNOSIS — E669 Obesity, unspecified: Secondary | ICD-10-CM | POA: Diagnosis present

## 2014-08-25 DIAGNOSIS — F410 Panic disorder [episodic paroxysmal anxiety] without agoraphobia: Secondary | ICD-10-CM | POA: Diagnosis present

## 2014-08-25 DIAGNOSIS — N179 Acute kidney failure, unspecified: Secondary | ICD-10-CM

## 2014-08-25 DIAGNOSIS — I5023 Acute on chronic systolic (congestive) heart failure: Secondary | ICD-10-CM | POA: Diagnosis present

## 2014-08-25 DIAGNOSIS — R578 Other shock: Secondary | ICD-10-CM | POA: Insufficient documentation

## 2014-08-25 DIAGNOSIS — W19XXXA Unspecified fall, initial encounter: Secondary | ICD-10-CM

## 2014-08-25 DIAGNOSIS — Z794 Long term (current) use of insulin: Secondary | ICD-10-CM

## 2014-08-25 DIAGNOSIS — N185 Chronic kidney disease, stage 5: Secondary | ICD-10-CM | POA: Diagnosis not present

## 2014-08-25 DIAGNOSIS — R112 Nausea with vomiting, unspecified: Secondary | ICD-10-CM

## 2014-08-25 DIAGNOSIS — E876 Hypokalemia: Secondary | ICD-10-CM | POA: Diagnosis present

## 2014-08-25 DIAGNOSIS — I472 Ventricular tachycardia, unspecified: Secondary | ICD-10-CM

## 2014-08-25 DIAGNOSIS — R238 Other skin changes: Secondary | ICD-10-CM | POA: Diagnosis not present

## 2014-08-25 DIAGNOSIS — E230 Hypopituitarism: Secondary | ICD-10-CM | POA: Diagnosis present

## 2014-08-25 DIAGNOSIS — Z833 Family history of diabetes mellitus: Secondary | ICD-10-CM | POA: Diagnosis not present

## 2014-08-25 DIAGNOSIS — T148XXA Other injury of unspecified body region, initial encounter: Secondary | ICD-10-CM

## 2014-08-25 DIAGNOSIS — N189 Chronic kidney disease, unspecified: Secondary | ICD-10-CM

## 2014-08-25 DIAGNOSIS — Z95810 Presence of automatic (implantable) cardiac defibrillator: Secondary | ICD-10-CM | POA: Diagnosis present

## 2014-08-25 DIAGNOSIS — I272 Other secondary pulmonary hypertension: Secondary | ICD-10-CM | POA: Diagnosis present

## 2014-08-25 DIAGNOSIS — I480 Paroxysmal atrial fibrillation: Secondary | ICD-10-CM | POA: Diagnosis present

## 2014-08-25 DIAGNOSIS — S72302A Unspecified fracture of shaft of left femur, initial encounter for closed fracture: Secondary | ICD-10-CM | POA: Diagnosis present

## 2014-08-25 DIAGNOSIS — G4700 Insomnia, unspecified: Secondary | ICD-10-CM | POA: Diagnosis present

## 2014-08-25 DIAGNOSIS — S72142A Displaced intertrochanteric fracture of left femur, initial encounter for closed fracture: Principal | ICD-10-CM

## 2014-08-25 DIAGNOSIS — F4310 Post-traumatic stress disorder, unspecified: Secondary | ICD-10-CM | POA: Diagnosis present

## 2014-08-25 DIAGNOSIS — E871 Hypo-osmolality and hyponatremia: Secondary | ICD-10-CM | POA: Diagnosis not present

## 2014-08-25 DIAGNOSIS — D472 Monoclonal gammopathy: Secondary | ICD-10-CM | POA: Diagnosis present

## 2014-08-25 DIAGNOSIS — Z87891 Personal history of nicotine dependence: Secondary | ICD-10-CM

## 2014-08-25 DIAGNOSIS — N170 Acute kidney failure with tubular necrosis: Secondary | ICD-10-CM | POA: Diagnosis not present

## 2014-08-25 DIAGNOSIS — Z79899 Other long term (current) drug therapy: Secondary | ICD-10-CM | POA: Diagnosis not present

## 2014-08-25 DIAGNOSIS — E11649 Type 2 diabetes mellitus with hypoglycemia without coma: Secondary | ICD-10-CM | POA: Diagnosis not present

## 2014-08-25 DIAGNOSIS — L89152 Pressure ulcer of sacral region, stage 2: Secondary | ICD-10-CM | POA: Diagnosis not present

## 2014-08-25 DIAGNOSIS — Z888 Allergy status to other drugs, medicaments and biological substances status: Secondary | ICD-10-CM

## 2014-08-25 DIAGNOSIS — E872 Acidosis: Secondary | ICD-10-CM | POA: Diagnosis not present

## 2014-08-25 DIAGNOSIS — I509 Heart failure, unspecified: Secondary | ICD-10-CM

## 2014-08-25 DIAGNOSIS — F329 Major depressive disorder, single episode, unspecified: Secondary | ICD-10-CM | POA: Diagnosis present

## 2014-08-25 DIAGNOSIS — Z8249 Family history of ischemic heart disease and other diseases of the circulatory system: Secondary | ICD-10-CM

## 2014-08-25 DIAGNOSIS — I5022 Chronic systolic (congestive) heart failure: Secondary | ICD-10-CM | POA: Diagnosis not present

## 2014-08-25 DIAGNOSIS — N184 Chronic kidney disease, stage 4 (severe): Secondary | ICD-10-CM | POA: Diagnosis not present

## 2014-08-25 DIAGNOSIS — I481 Persistent atrial fibrillation: Secondary | ICD-10-CM | POA: Diagnosis not present

## 2014-08-25 DIAGNOSIS — E1142 Type 2 diabetes mellitus with diabetic polyneuropathy: Secondary | ICD-10-CM | POA: Diagnosis present

## 2014-08-25 DIAGNOSIS — R0602 Shortness of breath: Secondary | ICD-10-CM

## 2014-08-25 DIAGNOSIS — E1149 Type 2 diabetes mellitus with other diabetic neurological complication: Secondary | ICD-10-CM | POA: Diagnosis present

## 2014-08-25 DIAGNOSIS — Z7901 Long term (current) use of anticoagulants: Secondary | ICD-10-CM

## 2014-08-25 DIAGNOSIS — Z01810 Encounter for preprocedural cardiovascular examination: Secondary | ICD-10-CM | POA: Diagnosis not present

## 2014-08-25 DIAGNOSIS — K913 Postprocedural intestinal obstruction: Secondary | ICD-10-CM | POA: Diagnosis not present

## 2014-08-25 DIAGNOSIS — W06XXXA Fall from bed, initial encounter: Secondary | ICD-10-CM | POA: Diagnosis present

## 2014-08-25 DIAGNOSIS — I429 Cardiomyopathy, unspecified: Secondary | ICD-10-CM | POA: Diagnosis present

## 2014-08-25 DIAGNOSIS — Z452 Encounter for adjustment and management of vascular access device: Secondary | ICD-10-CM

## 2014-08-25 DIAGNOSIS — K661 Hemoperitoneum: Secondary | ICD-10-CM | POA: Diagnosis not present

## 2014-08-25 DIAGNOSIS — A419 Sepsis, unspecified organism: Secondary | ICD-10-CM | POA: Diagnosis not present

## 2014-08-25 DIAGNOSIS — Z6832 Body mass index (BMI) 32.0-32.9, adult: Secondary | ICD-10-CM

## 2014-08-25 DIAGNOSIS — E875 Hyperkalemia: Secondary | ICD-10-CM | POA: Diagnosis not present

## 2014-08-25 DIAGNOSIS — Z79891 Long term (current) use of opiate analgesic: Secondary | ICD-10-CM

## 2014-08-25 DIAGNOSIS — E039 Hypothyroidism, unspecified: Secondary | ICD-10-CM | POA: Diagnosis present

## 2014-08-25 DIAGNOSIS — S72142S Displaced intertrochanteric fracture of left femur, sequela: Secondary | ICD-10-CM | POA: Diagnosis not present

## 2014-08-25 DIAGNOSIS — Z419 Encounter for procedure for purposes other than remedying health state, unspecified: Secondary | ICD-10-CM

## 2014-08-25 DIAGNOSIS — I132 Hypertensive heart and chronic kidney disease with heart failure and with stage 5 chronic kidney disease, or end stage renal disease: Secondary | ICD-10-CM | POA: Diagnosis present

## 2014-08-25 DIAGNOSIS — K5900 Constipation, unspecified: Secondary | ICD-10-CM

## 2014-08-25 DIAGNOSIS — J189 Pneumonia, unspecified organism: Secondary | ICD-10-CM | POA: Diagnosis not present

## 2014-08-25 DIAGNOSIS — F4322 Adjustment disorder with anxiety: Secondary | ICD-10-CM | POA: Diagnosis present

## 2014-08-25 DIAGNOSIS — D62 Acute posthemorrhagic anemia: Secondary | ICD-10-CM | POA: Diagnosis not present

## 2014-08-25 DIAGNOSIS — N186 End stage renal disease: Secondary | ICD-10-CM

## 2014-08-25 DIAGNOSIS — E860 Dehydration: Secondary | ICD-10-CM | POA: Diagnosis present

## 2014-08-25 DIAGNOSIS — S301XXA Contusion of abdominal wall, initial encounter: Secondary | ICD-10-CM | POA: Diagnosis not present

## 2014-08-25 DIAGNOSIS — I493 Ventricular premature depolarization: Secondary | ICD-10-CM | POA: Diagnosis not present

## 2014-08-25 DIAGNOSIS — D638 Anemia in other chronic diseases classified elsewhere: Secondary | ICD-10-CM | POA: Diagnosis present

## 2014-08-25 DIAGNOSIS — E780 Pure hypercholesterolemia: Secondary | ICD-10-CM | POA: Diagnosis present

## 2014-08-25 DIAGNOSIS — R579 Shock, unspecified: Secondary | ICD-10-CM | POA: Diagnosis not present

## 2014-08-25 DIAGNOSIS — G4733 Obstructive sleep apnea (adult) (pediatric): Secondary | ICD-10-CM | POA: Diagnosis present

## 2014-08-25 DIAGNOSIS — R0902 Hypoxemia: Secondary | ICD-10-CM

## 2014-08-25 DIAGNOSIS — S72009A Fracture of unspecified part of neck of unspecified femur, initial encounter for closed fracture: Secondary | ICD-10-CM

## 2014-08-25 DIAGNOSIS — Z9981 Dependence on supplemental oxygen: Secondary | ICD-10-CM

## 2014-08-25 DIAGNOSIS — R109 Unspecified abdominal pain: Secondary | ICD-10-CM

## 2014-08-25 DIAGNOSIS — I4819 Other persistent atrial fibrillation: Secondary | ICD-10-CM | POA: Diagnosis present

## 2014-08-25 DIAGNOSIS — S72142D Displaced intertrochanteric fracture of left femur, subsequent encounter for closed fracture with routine healing: Secondary | ICD-10-CM | POA: Diagnosis not present

## 2014-08-25 DIAGNOSIS — S72302D Unspecified fracture of shaft of left femur, subsequent encounter for closed fracture with routine healing: Secondary | ICD-10-CM | POA: Diagnosis not present

## 2014-08-25 DIAGNOSIS — S72002D Fracture of unspecified part of neck of left femur, subsequent encounter for closed fracture with routine healing: Secondary | ICD-10-CM | POA: Diagnosis not present

## 2014-08-25 DIAGNOSIS — E1121 Type 2 diabetes mellitus with diabetic nephropathy: Secondary | ICD-10-CM | POA: Diagnosis not present

## 2014-08-25 DIAGNOSIS — E119 Type 2 diabetes mellitus without complications: Secondary | ICD-10-CM

## 2014-08-25 DIAGNOSIS — R5381 Other malaise: Secondary | ICD-10-CM | POA: Insufficient documentation

## 2014-08-25 DIAGNOSIS — E11329 Type 2 diabetes mellitus with mild nonproliferative diabetic retinopathy without macular edema: Secondary | ICD-10-CM | POA: Diagnosis present

## 2014-08-25 DIAGNOSIS — S36892A Contusion of other intra-abdominal organs, initial encounter: Secondary | ICD-10-CM | POA: Diagnosis not present

## 2014-08-25 DIAGNOSIS — R34 Anuria and oliguria: Secondary | ICD-10-CM

## 2014-08-25 DIAGNOSIS — T82898A Other specified complication of vascular prosthetic devices, implants and grafts, initial encounter: Secondary | ICD-10-CM

## 2014-08-25 DIAGNOSIS — N390 Urinary tract infection, site not specified: Secondary | ICD-10-CM | POA: Diagnosis not present

## 2014-08-25 DIAGNOSIS — E1122 Type 2 diabetes mellitus with diabetic chronic kidney disease: Secondary | ICD-10-CM | POA: Diagnosis not present

## 2014-08-25 LAB — GLUCOSE, CAPILLARY
GLUCOSE-CAPILLARY: 194 mg/dL — AB (ref 65–99)
Glucose-Capillary: 206 mg/dL — ABNORMAL HIGH (ref 65–99)
Glucose-Capillary: 223 mg/dL — ABNORMAL HIGH (ref 65–99)

## 2014-08-25 LAB — CBC WITH DIFFERENTIAL/PLATELET
Basophils Absolute: 0 10*3/uL (ref 0.0–0.1)
Basophils Relative: 0 % (ref 0–1)
Eosinophils Absolute: 0.1 10*3/uL (ref 0.0–0.7)
Eosinophils Relative: 1 % (ref 0–5)
HCT: 30.7 % — ABNORMAL LOW (ref 39.0–52.0)
HEMOGLOBIN: 9.9 g/dL — AB (ref 13.0–17.0)
LYMPHS ABS: 0.7 10*3/uL (ref 0.7–4.0)
LYMPHS PCT: 9 % — AB (ref 12–46)
MCH: 30.8 pg (ref 26.0–34.0)
MCHC: 32.2 g/dL (ref 30.0–36.0)
MCV: 95.6 fL (ref 78.0–100.0)
Monocytes Absolute: 0.8 10*3/uL (ref 0.1–1.0)
Monocytes Relative: 10 % (ref 3–12)
NEUTROS ABS: 6.8 10*3/uL (ref 1.7–7.7)
NEUTROS PCT: 80 % — AB (ref 43–77)
PLATELETS: 241 10*3/uL (ref 150–400)
RBC: 3.21 MIL/uL — AB (ref 4.22–5.81)
RDW: 14.5 % (ref 11.5–15.5)
WBC: 8.5 10*3/uL (ref 4.0–10.5)

## 2014-08-25 LAB — URIC ACID: Uric Acid, Serum: 12 mg/dL — ABNORMAL HIGH (ref 4.4–7.6)

## 2014-08-25 LAB — BASIC METABOLIC PANEL
Anion gap: 11 (ref 5–15)
BUN: 44 mg/dL — AB (ref 6–20)
CO2: 23 mmol/L (ref 22–32)
CREATININE: 2.46 mg/dL — AB (ref 0.61–1.24)
Calcium: 8.9 mg/dL (ref 8.9–10.3)
Chloride: 93 mmol/L — ABNORMAL LOW (ref 101–111)
GFR calc Af Amer: 31 mL/min — ABNORMAL LOW (ref >60)
GFR, EST NON AFRICAN AMERICAN: 26 mL/min — AB (ref >60)
GLUCOSE: 233 mg/dL — AB (ref 65–99)
Potassium: 4.7 mmol/L (ref 3.5–5.1)
Sodium: 127 mmol/L — ABNORMAL LOW (ref 135–145)

## 2014-08-25 LAB — URINALYSIS, ROUTINE W REFLEX MICROSCOPIC
GLUCOSE, UA: NEGATIVE mg/dL
HGB URINE DIPSTICK: NEGATIVE
Ketones, ur: 15 mg/dL — AB
LEUKOCYTES UA: NEGATIVE
Nitrite: NEGATIVE
PROTEIN: NEGATIVE mg/dL
Specific Gravity, Urine: 1.015 (ref 1.005–1.030)
Urobilinogen, UA: 1 mg/dL (ref 0.0–1.0)
pH: 5 (ref 5.0–8.0)

## 2014-08-25 LAB — OSMOLALITY: Osmolality: 298 mOsm/kg (ref 275–300)

## 2014-08-25 LAB — PROTIME-INR
INR: 1.61 — AB (ref 0.00–1.49)
Prothrombin Time: 19.2 seconds — ABNORMAL HIGH (ref 11.6–15.2)

## 2014-08-25 LAB — SODIUM, URINE, RANDOM: Sodium, Ur: 18 mmol/L

## 2014-08-25 MED ORDER — CEFAZOLIN SODIUM-DEXTROSE 2-3 GM-% IV SOLR
2.0000 g | INTRAVENOUS | Status: AC
  Administered 2014-08-26: 2 g via INTRAVENOUS
  Filled 2014-08-25 (×2): qty 50

## 2014-08-25 MED ORDER — POLYETHYLENE GLYCOL 3350 17 G PO PACK
17.0000 g | PACK | Freq: Every day | ORAL | Status: DC | PRN
  Administered 2014-08-27: 17 g via ORAL
  Filled 2014-08-25 (×2): qty 1

## 2014-08-25 MED ORDER — MILRINONE IN DEXTROSE 20 MG/100ML IV SOLN
0.2500 ug/kg/min | INTRAVENOUS | Status: DC
  Administered 2014-08-25 – 2014-08-31 (×18): 0.5 ug/kg/min via INTRAVENOUS
  Filled 2014-08-25 (×20): qty 100

## 2014-08-25 MED ORDER — MILRINONE IN DEXTROSE 20 MG/100ML IV SOLN: 0.5000 ug/kg/min | INTRAVENOUS | Status: DC

## 2014-08-25 MED ORDER — ALPRAZOLAM 0.5 MG PO TABS
0.5000 mg | ORAL_TABLET | Freq: Every day | ORAL | Status: DC
  Administered 2014-08-25 – 2014-09-06 (×11): 0.5 mg via ORAL
  Filled 2014-08-25 (×11): qty 1

## 2014-08-25 MED ORDER — SODIUM CHLORIDE 0.9 % IV SOLN: INTRAVENOUS | Status: AC

## 2014-08-25 MED ORDER — HYDRALAZINE HCL 50 MG PO TABS
100.0000 mg | ORAL_TABLET | Freq: Three times a day (TID) | ORAL | Status: DC
  Administered 2014-08-25 – 2014-08-30 (×13): 100 mg via ORAL
  Filled 2014-08-25 (×16): qty 2

## 2014-08-25 MED ORDER — INSULIN ASPART 100 UNIT/ML ~~LOC~~ SOLN
0.0000 [IU] | SUBCUTANEOUS | Status: DC
  Administered 2014-08-25: 3 [IU] via SUBCUTANEOUS
  Administered 2014-08-25: 2 [IU] via SUBCUTANEOUS
  Administered 2014-08-26: 3 [IU] via SUBCUTANEOUS
  Administered 2014-08-26 – 2014-08-27 (×4): 2 [IU] via SUBCUTANEOUS
  Administered 2014-08-27: 3 [IU] via SUBCUTANEOUS

## 2014-08-25 MED ORDER — ALPRAZOLAM 0.5 MG PO TABS
0.5000 mg | ORAL_TABLET | Freq: Two times a day (BID) | ORAL | Status: DC | PRN
  Administered 2014-08-26 – 2014-09-06 (×4): 0.5 mg via ORAL
  Filled 2014-08-25 (×4): qty 1

## 2014-08-25 MED ORDER — POTASSIUM CHLORIDE IN NACL 20-0.45 MEQ/L-% IV SOLN
INTRAVENOUS | Status: DC
  Administered 2014-08-25 – 2014-08-26 (×2): via INTRAVENOUS
  Filled 2014-08-25 (×5): qty 1000

## 2014-08-25 MED ORDER — LEVOTHYROXINE SODIUM 75 MCG PO TABS
75.0000 ug | ORAL_TABLET | Freq: Every day | ORAL | Status: DC
  Administered 2014-08-26 – 2014-09-13 (×18): 75 ug via ORAL
  Filled 2014-08-25 (×23): qty 1

## 2014-08-25 MED ORDER — INSULIN GLARGINE 100 UNIT/ML ~~LOC~~ SOLN
15.0000 [IU] | Freq: Two times a day (BID) | SUBCUTANEOUS | Status: DC
  Administered 2014-08-26 – 2014-09-04 (×15): 15 [IU] via SUBCUTANEOUS
  Filled 2014-08-25 (×22): qty 0.15

## 2014-08-25 MED ORDER — METOPROLOL SUCCINATE ER 25 MG PO TB24
25.0000 mg | ORAL_TABLET | Freq: Two times a day (BID) | ORAL | Status: DC
  Administered 2014-08-25 – 2014-08-26 (×2): 25 mg via ORAL
  Filled 2014-08-25 (×4): qty 1

## 2014-08-25 MED ORDER — SILDENAFIL CITRATE 20 MG PO TABS
80.0000 mg | ORAL_TABLET | Freq: Three times a day (TID) | ORAL | Status: DC
  Administered 2014-08-25 – 2014-10-11 (×126): 80 mg via ORAL
  Filled 2014-08-25 (×143): qty 4

## 2014-08-25 MED ORDER — SODIUM CHLORIDE 0.9 % IV SOLN: INTRAVENOUS | Status: DC

## 2014-08-25 MED ORDER — ACETAMINOPHEN 500 MG PO TABS
1000.0000 mg | ORAL_TABLET | Freq: Once | ORAL | Status: AC
  Administered 2014-08-25: 1000 mg via ORAL
  Filled 2014-08-25: qty 2

## 2014-08-25 MED ORDER — MORPHINE SULFATE 2 MG/ML IJ SOLN
0.5000 mg | INTRAMUSCULAR | Status: DC | PRN
  Administered 2014-08-25 – 2014-09-15 (×8): 0.5 mg via INTRAVENOUS
  Filled 2014-08-25 (×12): qty 1

## 2014-08-25 MED ORDER — ALPRAZOLAM 0.5 MG PO TABS: 0.5000 mg | ORAL_TABLET | ORAL | Status: DC

## 2014-08-25 MED ORDER — HYDROMORPHONE HCL 1 MG/ML IJ SOLN
1.0000 mg | INTRAMUSCULAR | Status: AC | PRN
  Administered 2014-08-25 (×2): 1 mg via INTRAVENOUS
  Filled 2014-08-25 (×2): qty 1

## 2014-08-25 MED ORDER — DIGOXIN 0.0625 MG HALF TABLET
0.0625 mg | ORAL_TABLET | Freq: Every day | ORAL | Status: DC
  Administered 2014-08-25 – 2014-08-28 (×4): 0.0625 mg via ORAL
  Filled 2014-08-25 (×4): qty 1

## 2014-08-25 MED ORDER — ZOLPIDEM TARTRATE 5 MG PO TABS
5.0000 mg | ORAL_TABLET | Freq: Every evening | ORAL | Status: DC | PRN
  Administered 2014-08-26 – 2014-08-30 (×5): 5 mg via ORAL
  Filled 2014-08-25 (×5): qty 1

## 2014-08-25 MED ORDER — ONDANSETRON HCL 4 MG/2ML IJ SOLN
4.0000 mg | Freq: Once | INTRAMUSCULAR | Status: AC
  Administered 2014-08-25: 4 mg via INTRAVENOUS
  Filled 2014-08-25: qty 2

## 2014-08-25 MED ORDER — METOPROLOL SUCCINATE ER 25 MG PO TB24
25.0000 mg | ORAL_TABLET | Freq: Two times a day (BID) | ORAL | Status: DC
  Filled 2014-08-25: qty 1

## 2014-08-25 MED ORDER — CHLORHEXIDINE GLUCONATE 4 % EX LIQD
60.0000 mL | Freq: Once | CUTANEOUS | Status: AC
  Administered 2014-08-26: 4 via TOPICAL
  Filled 2014-08-25: qty 60

## 2014-08-25 MED ORDER — OXYCODONE-ACETAMINOPHEN 5-325 MG PO TABS
1.0000 | ORAL_TABLET | ORAL | Status: DC | PRN
  Administered 2014-08-26 (×2): 1 via ORAL
  Filled 2014-08-25 (×2): qty 1

## 2014-08-25 MED ORDER — TORSEMIDE 20 MG PO TABS
60.0000 mg | ORAL_TABLET | Freq: Two times a day (BID) | ORAL | Status: DC
  Administered 2014-08-26 – 2014-08-27 (×2): 60 mg via ORAL
  Filled 2014-08-25 (×5): qty 3

## 2014-08-25 MED ORDER — DULOXETINE HCL 60 MG PO CPEP
60.0000 mg | ORAL_CAPSULE | Freq: Two times a day (BID) | ORAL | Status: DC
  Administered 2014-08-25 – 2014-08-30 (×10): 60 mg via ORAL
  Filled 2014-08-25 (×13): qty 1

## 2014-08-25 MED ORDER — POTASSIUM CHLORIDE CRYS ER 20 MEQ PO TBCR
20.0000 meq | EXTENDED_RELEASE_TABLET | Freq: Two times a day (BID) | ORAL | Status: DC
  Administered 2014-08-25 – 2014-08-27 (×4): 20 meq via ORAL
  Filled 2014-08-25 (×9): qty 1

## 2014-08-25 MED ORDER — ONDANSETRON HCL 4 MG/2ML IJ SOLN
4.0000 mg | Freq: Four times a day (QID) | INTRAMUSCULAR | Status: DC | PRN
Start: 2014-08-25 — End: 2014-10-11
  Administered 2014-08-29 – 2014-10-11 (×27): 4 mg via INTRAVENOUS
  Filled 2014-08-25 (×27): qty 2

## 2014-08-25 MED ORDER — BISACODYL 5 MG PO TBEC
5.0000 mg | DELAYED_RELEASE_TABLET | Freq: Every day | ORAL | Status: DC
  Administered 2014-08-25 – 2014-09-01 (×8): 5 mg via ORAL
  Filled 2014-08-25 (×8): qty 1

## 2014-08-25 MED ORDER — ALBUTEROL SULFATE (2.5 MG/3ML) 0.083% IN NEBU: 2.5000 mg | INHALATION_SOLUTION | RESPIRATORY_TRACT | Status: DC | PRN

## 2014-08-25 MED ORDER — AMIODARONE HCL 200 MG PO TABS
200.0000 mg | ORAL_TABLET | Freq: Every day | ORAL | Status: DC
  Administered 2014-08-25 – 2014-08-26 (×2): 200 mg via ORAL
  Filled 2014-08-25 (×2): qty 1

## 2014-08-25 MED ORDER — SODIUM CHLORIDE 0.9 % IJ SOLN
10.0000 mL | INTRAMUSCULAR | Status: DC | PRN
  Administered 2014-08-25: 20 mL
  Administered 2014-08-26 – 2014-09-16 (×10): 10 mL
  Administered 2014-09-22: 20 mL
  Administered 2014-09-23: 10 mL
  Administered 2014-09-25 (×2): 20 mL
  Administered 2014-09-26 – 2014-10-10 (×4): 10 mL
  Filled 2014-08-25 (×19): qty 40

## 2014-08-25 NOTE — Progress Notes (Signed)
Patient came to the floor at 1830,  Alert and oriented, on milrinone gtt, v/s stable. Wife at bedside. First degree AV block with LBBB on the monitor. Will continue to monitor patient.

## 2014-08-25 NOTE — Consult Note (Signed)
ORTHOPAEDIC CONSULTATION  REQUESTING PHYSICIAN: Thurnell Lose, MD  Chief Complaint: Left hip fracture   HPI: Larry [name] is a 64 y.o. male who complains of left hip pain after sustaining a mechanical fall at home this morning.  He presented to the ER for evaluation after being unable to ambulate without pain.  Prior to the fall, the patient was abulatory without any assistive devices.    He has a significant cardiac history that makes him unable to walk more than 177f without feeling short of breath.  The patient has a dual chamber pacemaker.  He is on Xarelto for paroxysmal Afib.  He is also on a milrinone drip for chronic severe systolic heart failure. He has also recently undergone removal of his teeth in preporation of possible LVAD procedure.  The patient's medical history also includes DM type II, OSA, nonocclusive CAD with recent cath 06/2014, and chronic kidney disease stage IV.  Past Medical History  Diagnosis Date  . NICM (nonischemic cardiomyopathy)     a. NICM. EF 2011 25-30%. b. LHC 2009 nonobstructive CAD. c. h/o [employer] Jude ICD 2012. d. Echo 04/2013 - EF 25-30% with mildly decreased RV systolic function. e. Echo 01/2014: EF 20-25%, inf akinesis, RHC. e. Home milrinone started 4/16.  .Marland KitchenCAD (coronary artery disease)     a. nonobstructive CAD by cath 2009 & 06/2014.  . Obesity   . CVA (cerebral vascular accident)     CVA 2007 without residual deficit  . Pituitary tumor      (nonfunctionging pituitary microadenoma) [facility] with [facility] and [facility]opathy  . Depression   . Erectile dysfunction   . DDD (degenerative disc disease)     Chronic low back pain.   . OSA (obstructive sleep apnea)     a. 4/16 sleep study with severe OSA  . Monoclonal gammopathy   . CKD (chronic kidney disease), stage III   . 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 Hypogonadotropic hypogonadism   . Polycythemia, secondary     improved/resolved  .  Hypercholesterolemia   . Back pain     F/B Dr. RNelva Bush . Monoclonal gammopathy of unknown significance     per Dr. MMercy Moore . Neck pain     F/B Dr. RNelva Bush . PAF (paroxysmal atrial fibrillation) 06/04/10    a. On Xarelto. b. DCCV 02/2014, 04/2014 to NSR.  .Marland KitchenHistory of diverticulitis of colon   . Type II or unspecified type diabetes mellitus with neurological manifestations, uncontrolled   . Nonproliferative diabetic retinopathy NOS(362.03)   . Ventricular tachycardia 10/25/13    appropriate ICD shock, VT CL 230-240 msec  . Confusion 02/01/2014  . Hypertension   . AICD (automatic cardioverter/defibrillator) present   . Anxiety   . Peripheral vascular disease     2007  . Hypothyroidism   . PTSD (post-traumatic stress disorder)     a. Anxiety/PTSD from ICD shock  . Lupus anticoagulant positive     a. No h/o DVT. Saw Dr GLindi Adiewith hematology.   . Pulmonary hypertension     a. Mixed pulmonary venous hypertension and PAH  . Chronic respiratory failure     a. As of 08/2014 requiring home O2 with ambulation due to desat.   Past Surgical History  Procedure Laterality Date  . Gamma knife surgery for pituitary tumor    . Tonsillectomy    . Cardiac defibrillator placement  02/19/10    By JA.   .[name]  Right heart catheterization N/A 09/17/2013    Procedure: RIGHT HEART CATH;  Surgeon: [name], MD;  Location: [facility] CATH LAB;  Service: Cardiovascular;  Laterality: N/A;  . Cardiac catheterization    . Brain surgery    . Insert / replace / remove pacemaker    . Cardioversion N/A 02/14/2014    Procedure: CARDIOVERSION;  Surgeon: [name], MD;  Location: [place];  Service: Cardiovascular;  Laterality: N/A;  . Cardioversion N/A 05/02/2014    Procedure: CARDIOVERSION;  Surgeon: [name], MD;  Location: [facility] ENDOSCOPY;  Service: Cardiovascular;  Laterality: N/A;  . Right heart catheterization N/A 05/08/2014    Procedure: RIGHT HEART CATH;  Surgeon: [name], MD;  Location:  [facility] CATH LAB;  Service: Cardiovascular;  Laterality: N/A;  . Cardiac catheterization N/A 06/26/2014    Procedure: Right/Left Heart Cath And Coronary Angiography;  Surgeon: [name], MD;  Location: [place] CV LAB;  Service: Cardiovascular;  Laterality: N/A;  . Cardiac catheterization N/A 08/21/2014    Procedure: Right Heart Cath;  Surgeon: [name], MD;  Location: [place] CV LAB;  Service: Cardiovascular;  Laterality: N/A;  . Multiple extractions with alveoloplasty N/A 08/22/2014    Procedure: Extraction of toothy #'s 3,5,6,7,9,10,11,12,13,14,20,21,22,23,27,28,29,30 with alveoloplasty and bilateral mandibular tori reductions;  Surgeon: Lenn Cal, DDS;  Location: [place];  Service: Oral Surgery;  Laterality: N/A;  . Cataract extraction     History   Social History  . Marital Status: Married    Spouse Name: N/A  . Number of Children: 1  . Years of Education: N/A   Occupational History  . Disabled    Social History Main Topics  . Smoking status: Former Smoker    Types: Cigars    Quit date: 02/09/1983  . Smokeless tobacco: Never Used     Comment: 25 yrs ago  . Alcohol Use: No  . Drug Use: No  . Sexual Activity: No   Other Topics Concern  . None   Social History Narrative   Lives with wife.     Family History  Problem Relation Age of Onset  . Lung cancer    . Cancer Mother     skin  . Dementia Mother   . Depression Mother   . Heart disease Father 27  . Hypertension Father   . Lung cancer Father   . Obesity Father   . Obesity Sister   . Hypertension Sister   . Diabetes Brother   . Hypertension Brother   . Heart disease Brother 13    Died of stroke MI age 15  . Obesity Brother   . Lung cancer Paternal Uncle   . Diabetes Paternal Uncle     requiring leg amputations    Allergies  Allergen Reactions  . Bydureon [Exenatide] Other (See Comments)    sweating  . Losartan Potassium Other (See Comments)    insomnia   Prior to Admission  medications   Medication Sig Start Date End Date Taking? Authorizing Provider  ALPRAZolam Duanne Moron) 0.5 MG tablet Take 0.5 mg by mouth See admin instructions. Take 1 tablet (0.5 mg) every night, may take an additional tablet two more times during the day as needed for anxiety   Yes Historical Provider, MD  amiodarone (PACERONE) 200 MG tablet Take 1 tablet (200 mg total) by mouth daily. 06/19/14  Yes [name], MD  atorvastatin (LIPITOR) 40 MG tablet TAKE 1 TABLET DAILY 03/18/14  Yes Shaune Pascal Bensimhon,  MD  bisacodyl (DULCOLAX) 5 MG EC tablet Take 5 mg by mouth at bedtime.   Yes Historical Provider, MD  chlorhexidine (PERIDEX) 0.12 % solution Perform mouth rinses twice daily after breakfast and at bedtime. Patient taking differently: Use as directed in the mouth or throat 2 (two) times daily. Perform mouth rinses twice daily after breakfast and at bedtime (swish and spit) 08/24/14  Yes Dayna N Dunn, PA-C  digoxin (LANOXIN) 0.125 MG tablet Take 0.5 tablets (0.0625 mg total) by mouth daily. 05/01/14  Yes [name], MD  DULoxetine (CYMBALTA) 30 MG capsule Take 60 mg by mouth 2 (two) times daily.    Yes Historical Provider, MD  GLUCAGON EMERGENCY 1 MG injection Inject 1 mg as directed once as needed (hypotension).  01/30/14  Yes Historical Provider, MD  hydrALAZINE (APRESOLINE) 100 MG tablet Take 1 tablet (100 mg total) by mouth 3 (three) times daily. 08/24/14  Yes Dayna N Dunn, PA-C  insulin glargine (LANTUS) 100 unit/mL SOPN Inject 30 Units into the skin at bedtime.   Yes Historical Provider, MD  insulin lispro (HUMALOG KWIKPEN) 100 UNIT/ML KiwkPen Inject 30 Units into the skin 3 (three) times daily before meals.   Yes Historical Provider, MD  levothyroxine (SYNTHROID, LEVOTHROID) 75 MCG tablet TAKE 1 TABLET ON AN EMPTY STOMACH 30 MINUTES BEFORE BREAKFAST 07/13/14  Yes Historical Provider, MD  Melatonin 10 MG TABS Take 10 mg by mouth at bedtime as needed (sleep).    Yes Historical Provider, MD    metoprolol succinate (TOPROL-XL) 25 MG 24 hr tablet Take 1 tablet (25 mg total) by mouth 2 (two) times daily before a meal. 05/29/14  Yes Amy D Clegg, NP  milrinone (PRIMACOR) 20 MG/100ML SOLN infusion Inject 51.2 mcg/min into the vein continuous. (0.56mg/kg/min) 08/24/14  Yes Dayna N Dunn, PA-C  Multiple Vitamin (MULITIVITAMIN WITH MINERALS) TABS Take 1 tablet by mouth daily.   Yes Historical Provider, MD  oxyCODONE-acetaminophen (PERCOCET) 10-325 MG per tablet Take 1 tablet by mouth every 4 (four) hours as needed for pain.  08/08/14  Yes Historical Provider, MD  Potassium Chloride ER 20 MEQ TBCR Take 20 mEq by mouth 2 (two) times daily. 08/15/14  Yes Dayna N Dunn, PA-C  rivaroxaban (XARELTO) 15 MG TABS tablet Take 1 tablet (15 mg total) by mouth daily with supper. 08/13/14  Yes DLarey Dresser MD  sildenafil (REVATIO) 20 MG tablet Take 4 tablets (80 mg total) by mouth 3 (three) times daily. 08/24/14  Yes Dayna N Dunn, PA-C  spironolactone (ALDACTONE) 25 MG tablet TAKE 1 TABLET DAILY 08/14/14  Yes JJettie Booze MD  torsemide (DEMADEX) 20 MG tablet Take 3 tablets (60 mg total) by mouth 2 (two) times daily. 08/24/14  Yes Dayna N Dunn, PA-C  zolpidem (AMBIEN) 5 MG tablet Take 1 tablet (5 mg total) by mouth at bedtime as needed for sleep. 06/04/14  Yes DLarey Dresser MD  oxyCODONE-acetaminophen (PERCOCET/ROXICET) 5-325 MG per tablet Take 1-2 tablets by mouth every 4-6 hours as needed for moderate-severe pain. Patient not taking: Reported on 08/25/2014 08/24/14   Dayna N Dunn, PA-C   Ct Head Wo Contrast  08/25/2014   CLINICAL DATA:  FGolden Circleout of bed.  Left hip pain.  EXAM: CT HEAD WITHOUT CONTRAST  TECHNIQUE: Contiguous axial images were obtained from the base of the skull through the vertex without intravenous contrast.  COMPARISON:  02/01/2014  FINDINGS: Remote right posterior MCA territory infarct is identified with encephalomalacia involving the posterior right parietal lobe.  This appears similar to  previous exam. Mild low attenuation within the periventricular and subcortical white matter noted compatible with chronic microvascular disease. Prominence of the sulci and ventricles noted. No acute cortical infarct. No acute intracranial hemorrhage or mass. There is no abnormal extra-axial fluid collections identified. Paranasal sinuses and mastoid air cells are clear. The calvarium is intact.  IMPRESSION: 1. No acute intracranial abnormalities. 2. Remote right MCA territory infarct. 3. Small vessel ischemic disease and brain atrophy.   Electronically Signed   By: Kerby Moors M.D.   On: 08/25/2014 14:36   Chest Portable 1 View  08/25/2014   CLINICAL DATA:  Shortness of Breath  EXAM: PORTABLE CHEST - 1 VIEW  COMPARISON:  08/22/2014  FINDINGS: Cardiac shadow is enlarged. Pacing device is again seen and stable. A right-sided PICC line is again noted in the mid superior vena cava. The lungs are well aerated bilaterally without focal infiltrate or sizable effusion. No acute bony abnormality is seen.  IMPRESSION: No acute abnormality noted.  No change from the prior exam.   Electronically Signed   By: Inez Catalina M.D.   On: 08/25/2014 19:42   Dg Hip Unilat With Pelvis 2-3 Views Left  08/25/2014   CLINICAL DATA:  Status post fall.  Left hip pain.  EXAM: DG HIP (WITH OR WITHOUT PELVIS) 2-3V LEFT  COMPARISON:  None.  FINDINGS: There is generalized osteopenia. There is a nondisplaced femoral neck fracture which extends into the intertrochanteric region. There is no dislocation. There is no other fracture. 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 There are degenerative changes of bilateral sacroiliac joints.  IMPRESSION: Nondisplaced left femoral neck fracture which extends into the intertrochanteric region.   Electronically Signed   By: Kathreen Devoid   On: 08/25/2014 14:17    Positive ROS: All other systems have been reviewed and were otherwise negative with the exception of those mentioned in the HPI  and as above.  Labs cbc  Recent Labs  08/24/14 0530 08/25/14 1458  WBC 9.3 8.5  HGB 10.8* 9.9*  HCT 33.3* 30.7*  PLT 285 241    Labs inflam No results for input(s): CRP in the last 72 hours.  Invalid input(s): ESR  Labs coag  Recent Labs  08/25/14 1534  INR 1.61*     Recent Labs  08/24/14 1000 08/25/14 1458  NA 130* 127*  K 4.6 4.7  CL 95* 93*  CO2 27 23  GLUCOSE 280* 233*  BUN 31* 44*  CREATININE 1.81* 2.46*  CALCIUM 8.8* 8.9    Physical Exam: Filed Vitals:   08/25/14 1838  BP: 127/53  Pulse: 85  Temp: 97.9 F (36.6 C)  Resp: 20   General: Alert, no acute distress Cardiovascular: No pedal edema Respiratory: No cyanosis, no use of accessory musculature GI: No organomegaly, abdomen is soft and non-tender Skin: No lesions in the area of chief complaint other than those listed below in MSK exam.  Neurologic: Sensation intact distally Psychiatric: Patient is competent for consent with normal mood and affect Lymphatic: No axillary or cervical lymphadenopathy  MUSCULOSKELETAL:  Left hip tenderness to palpation and significant pain with ROM and ambulation.  Sensation intact with 2+ distal pulses. Other extremities are atraumatic with painless ROM and NVI.  Assessment: L hip fracture  Plan: We had a long discussion with the patient and his family.  Given his extensive cardiac history and kidney disease, he is high risk for surgery.  We explained that without surgical intervention, the  patient would likely be able to stand and transfer to a chair, but would have difficulty with ambulation.  Since the patient was ambulatory at baseline prior to the fall, the patient would like to proceed with surgical planning.  We will need to have cardiology see the patient and clear for surgery.  The tentative plan is to take to the OR tomorrow for IM nail of the L hip.   Weight Bearing Status: bedrest VTE px: SCD's and chemical prophylaxis on hold till  surgery   [facility] [phone]   08/25/2014 9:06 PM

## 2014-08-25 NOTE — ED Notes (Signed)
Pt has PICC line in Rupper arm. Placed at [facility]

## 2014-08-25 NOTE — H&P (Addendum)
Patient Demographics:    Larry [name], is a 64 y.o. male  MRN: [id number]   DOB - July 18, 1950  Admit Date - 08/25/2014  Outpatient Primary MD for the patient is [name], MD   With History of -  Past Medical History  Diagnosis Date  . NICM (nonischemic cardiomyopathy)     a. NICM. EF 2011 25-30%. b. LHC 2009 nonobstructive CAD. c. h/o [employer] Jude ICD 2012. d. Echo 04/2013 - EF 25-30% with mildly decreased RV systolic function. e. Echo 01/2014: EF 20-25%, inf akinesis, RHC. e. Home milrinone started 4/16.  [name] CAD (coronary artery disease)     a. nonobstructive CAD by cath 2009 & 06/2014.  . Obesity   . CVA (cerebral vascular accident)     CVA 2007 without residual deficit  . Pituitary tumor      (nonfunctionging pituitary microadenoma) s/p gamma knife surgery at [facility] 2009 with neuropathy and retinopathy  . Depression   . Erectile dysfunction   . DDD (degenerative disc disease)     Chronic low back pain.   . OSA (obstructive sleep apnea)     a. 4/16 sleep study with severe OSA  . Monoclonal gammopathy   . CKD (chronic kidney disease), stage III   . 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 Hypogonadotropic hypogonadism   . Polycythemia, secondary     improved/resolved  . Hypercholesterolemia   . Back pain     F/B [name]  . Monoclonal gammopathy of unknown significance     per [name]  . Neck pain     F/B [name]  . PAF (paroxysmal atrial fibrillation) 06/04/10    a. On Xarelto. b. DCCV 02/2014, 04/2014 to NSR.  [name] History of diverticulitis of colon   . Type II or unspecified type diabetes mellitus with neurological manifestations, uncontrolled   . Nonproliferative diabetic retinopathy NOS(362.03)   . Ventricular tachycardia 10/25/13    appropriate ICD shock, VT CL 230-240 msec  .  Confusion 02/01/2014  . Hypertension   . AICD (automatic cardioverter/defibrillator) present   . Anxiety   . Peripheral vascular disease     2007  . Hypothyroidism   . PTSD (post-traumatic stress disorder)     a. Anxiety/PTSD from ICD shock  . Lupus anticoagulant positive     a. No h/o DVT. Saw Dr Lindi Adie with hematology.   . Pulmonary hypertension     a. Mixed pulmonary venous hypertension and PAH  . Chronic respiratory failure     a. As of 08/2014 requiring home O2 with ambulation due to desat.      Past Surgical History  Procedure Laterality Date  . Gamma knife surgery for pituitary tumor    . Tonsillectomy    . Cardiac defibrillator placement  02/19/10    By JA.   . Right heart catheterization N/A 09/17/2013    Procedure: RIGHT HEART CATH;  Surgeon: [name],  MD;  Location: [place] CATH LAB;  Service: Cardiovascular;  Laterality: N/A;  . Cardiac catheterization    . Brain surgery    . Insert / replace / remove pacemaker    . Cardioversion N/A 02/14/2014    Procedure: CARDIOVERSION;  Surgeon: [name], MD;  Location: [place];  Service: Cardiovascular;  Laterality: N/A;  . Cardioversion N/A 05/02/2014    Procedure: CARDIOVERSION;  Surgeon: [name], MD;  Location: [facility] ENDOSCOPY;  Service: Cardiovascular;  Laterality: N/A;  . Right heart catheterization N/A 05/08/2014    Procedure: RIGHT HEART CATH;  Surgeon: [name], MD;  Location: [facility] CATH LAB;  Service: Cardiovascular;  Laterality: N/A;  . Cardiac catheterization N/A 06/26/2014    Procedure: Right/Left Heart Cath And Coronary Angiography;  Surgeon: [name], MD;  Location: [place] CV LAB;  Service: Cardiovascular;  Laterality: N/A;  . Cardiac catheterization N/A 08/21/2014    Procedure: Right Heart Cath;  Surgeon: [name], MD;  Location: [place] CV LAB;  Service: Cardiovascular;  Laterality: N/A;  . Multiple extractions with alveoloplasty N/A 08/22/2014    Procedure: Extraction of  toothy #'s 3,5,6,7,9,10,11,12,13,14,20,21,22,23,27,28,29,30 with alveoloplasty and bilateral mandibular tori reductions;  Surgeon: Lenn Cal, DDS;  Location: [place];  Service: Oral Surgery;  Laterality: N/A;  . Cataract extraction      in for   Chief Complaint  Patient presents with  . Hip Pain      HPI:    Larry [name]  is a 64 y.o. male,  non ischemic cardiomyopathy with severe chronicSystolic CHF EF 02% on home milrinone drip under the care of Dr. Loralie Champagne, V. tach has a dual-chamber pacemaker with AICD, paroxysmal atrial fibrillation on xaralto, insulin-dependent type 2 diabetes mellitus, history of lupus anti-coagulant positive, hypothyroidism, nonocclusive CAD per Cath 06/2014, nonfunctioning Pitutary microadenoma,  chronic kidney disease stage IV baseline creatinine close to 1.9, obstructive sleep apnea not on C Pap/BiPAP, recent total teeth extraction done 2 days ago for a possible LVAD procedure xaralto on hold for the last 2 days was supposed to start taking his xaralto on Saturday.  Patient with above history who lives at his home with his wife, has limited mobility due to severe CHF and can walk about 100 yards maximum without getting short of breath and a chronic basis comes to the hospital after a mechanical trip and fall at home early this morning sustaining left hip injury, continued to have pain and came to the ER where he was found to have left femoral intertrochanteric fracture, cardiology and orthopedics were consulted and I was requested to do the admission.  Patient currently denies any headache, no chest pain or palpitations, no cough and shortness of breath, no focal weakness. No recent fever or chills.     Review of systems:    In addition to the HPI above,   No Fever-chills, No Headache, No changes with Vision or hearing, No problems swallowing food or Liquids, No Chest pain, Cough or Shortness of Breath, No Abdominal pain, No Nausea or Vommitting,  Bowel movements are regular, No Blood in stool or Urine, No dysuria, No new skin rashes or bruises, No new joints pains-aches, except L hip pain as above No new weakness, tingling, numbness in any extremity, No recent weight gain or loss, No polyuria, polydypsia or polyphagia, No significant Mental Stressors.  A full 10 point Review of Systems was done, except as stated above, all other Review of Systems were negative.  Social History:     History  Substance Use Topics  . Smoking status: Former Smoker    Types: Cigars    Quit date: 02/09/1983  . Smokeless tobacco: Never Used     Comment: 25 yrs ago  . Alcohol Use: No    Lives - at home, walks by self      Family History :     Family History  Problem Relation Age of Onset  . Lung cancer    . Cancer Mother     skin  . Dementia Mother   . Depression Mother   . Heart disease Father 10  . Hypertension Father   . Lung cancer Father   . Obesity Father   . Obesity Sister   . Hypertension Sister   . Diabetes Brother   . Hypertension Brother   . Heart disease Brother 84    Died of stroke MI age 51  . Obesity Brother   . Lung cancer Paternal Uncle   . Diabetes Paternal Uncle     requiring leg amputations        Home Medications:   Prior to Admission medications   Medication Sig Start Date End Date Taking? Authorizing Provider  ALPRAZolam Duanne Moron) 0.5 MG tablet Take 0.5 mg by mouth See admin instructions. Take 1 tablet (0.5 mg) every night, may take an additional tablet two more times during the day as needed for anxiety   Yes Historical Provider, MD  amiodarone (PACERONE) 200 MG tablet Take 1 tablet (200 mg total) by mouth daily. 06/19/14  Yes [name], MD  atorvastatin (LIPITOR) 40 MG tablet TAKE 1 TABLET DAILY 03/18/14  Yes [name], MD  bisacodyl (DULCOLAX) 5 MG EC tablet Take 5 mg by mouth at bedtime.   Yes Historical Provider, MD  chlorhexidine (PERIDEX) 0.12 % solution Perform mouth rinses  twice daily after breakfast and at bedtime. Patient taking differently: Use as directed in the mouth or throat 2 (two) times daily. Perform mouth rinses twice daily after breakfast and at bedtime (swish and spit) 08/24/14  Yes Dayna N Dunn, PA-C  digoxin (LANOXIN) 0.125 MG tablet Take 0.5 tablets (0.0625 mg total) by mouth daily. 05/01/14  Yes [name], MD  DULoxetine (CYMBALTA) 30 MG capsule Take 60 mg by mouth 2 (two) times daily.    Yes Historical Provider, MD  GLUCAGON EMERGENCY 1 MG injection Inject 1 mg as directed once as needed (hypotension).  01/30/14  Yes Historical Provider, MD  hydrALAZINE (APRESOLINE) 100 MG tablet Take 1 tablet (100 mg total) by mouth 3 (three) times daily. 08/24/14  Yes Dayna N Dunn, PA-C  insulin glargine (LANTUS) 100 unit/mL SOPN Inject 30 Units into the skin at bedtime.   Yes Historical Provider, MD  insulin lispro (HUMALOG KWIKPEN) 100 UNIT/ML KiwkPen Inject 30 Units into the skin 3 (three) times daily before meals.   Yes Historical Provider, MD  levothyroxine (SYNTHROID, LEVOTHROID) 75 MCG tablet TAKE 1 TABLET ON AN EMPTY STOMACH 30 MINUTES BEFORE BREAKFAST 07/13/14  Yes Historical Provider, MD  Melatonin 10 MG TABS Take 10 mg by mouth at bedtime as needed (sleep).    Yes Historical Provider, MD  metoprolol succinate (TOPROL-XL) 25 MG 24 hr tablet Take 1 tablet (25 mg total) by mouth 2 (two) times daily before a meal. 05/29/14  Yes Amy D Clegg, NP  milrinone (PRIMACOR) 20 MG/100ML SOLN infusion Inject 51.2 mcg/min into the vein continuous. (0.64mcg/kg/min) 08/24/14  Yes Charlie Pitter, PA-C  Multiple Vitamin (MULITIVITAMIN WITH MINERALS) TABS Take 1 tablet by mouth daily.   Yes Historical Provider, MD  oxyCODONE-acetaminophen (PERCOCET) 10-325 MG per tablet Take 1 tablet by mouth every 4 (four) hours as needed for pain.  08/08/14  Yes Historical Provider, MD  Potassium Chloride ER 20 MEQ TBCR Take 20 mEq by mouth 2 (two) times daily. 08/15/14  Yes Dayna N Dunn, PA-C    rivaroxaban (XARELTO) 15 MG TABS tablet Take 1 tablet (15 mg total) by mouth daily with supper. 08/13/14  Yes [name], MD  sildenafil (REVATIO) 20 MG tablet Take 4 tablets (80 mg total) by mouth 3 (three) times daily. 08/24/14  Yes Dayna N Dunn, PA-C  spironolactone (ALDACTONE) 25 MG tablet TAKE 1 TABLET DAILY 08/14/14  Yes Jettie Booze, MD  torsemide (DEMADEX) 20 MG tablet Take 3 tablets (60 mg total) by mouth 2 (two) times daily. 08/24/14  Yes Dayna N Dunn, PA-C  zolpidem (AMBIEN) 5 MG tablet Take 1 tablet (5 mg total) by mouth at bedtime as needed for sleep. 06/04/14  Yes [name], MD  oxyCODONE-acetaminophen (PERCOCET/ROXICET) 5-325 MG per tablet Take 1-2 tablets by mouth every 4-6 hours as needed for moderate-severe pain. Patient not taking: Reported on 08/25/2014 08/24/14   Nedra Hai Dunn, PA-C     Allergies:     Allergies  Allergen Reactions  . Bydureon [Exenatide] Other (See Comments)    sweating  . Losartan Potassium Other (See Comments)    insomnia     Physical Exam:   Vitals  Blood pressure 131/53, pulse 83, temperature 98.3 F (36.8 C), temperature source Oral, resp. rate 20, height 5\' 10"  (1.778 m), weight 102.059 kg (225 lb), SpO2 94 %.   1. General middle aged white male lying in bed in NAD,    2. Normal affect and insight, Not Suicidal or Homicidal, Awake Alert, Oriented X 3.  3. No F.N deficits, ALL C.Nerves Intact, Strength 5/5 all 4 extremities, Sensation intact all 4 extremities, Plantars down going.  4. Ears and Eyes appear Normal, Conjunctivae clear, PERRLA. Moist Oral Mucosa.  5. Supple Neck, No JVD, No cervical lymphadenopathy appriciated, No Carotid Bruits.  6. Symmetrical Chest wall movement, Good air movement bilaterally, CTAB.  7. RRR, No Gallops, Rubs or Murmurs, No Parasternal Heave.  8. Positive Bowel Sounds, Abdomen Soft, No tenderness, No organomegaly appriciated,No rebound -guarding or rigidity.  9.  No Cyanosis, Normal Skin  Turgor, No Skin Rash or Bruise.  10. Good muscle tone,  joints appear normal , no effusions, Normal ROM.  11. No Palpable Lymph Nodes in Neck or Axillae      Data Review:    CBC  Recent Labs Lab 08/22/14 0506 08/23/14 0500 08/24/14 0530 08/25/14 1458  WBC 5.1 7.2 9.3 8.5  HGB 9.7* 10.5* 10.8* 9.9*  HCT 30.3* 33.0* 33.3* 30.7*  PLT 235 257 285 241  MCV 99.7 98.5 98.8 95.6  MCH 31.9 31.3 32.0 30.8  MCHC 32.0 31.8 32.4 32.2  RDW 15.1 15.1 14.9 14.5  LYMPHSABS 0.9 0.6* 0.7 0.7  MONOABS 0.6 1.1* 1.1* 0.8  EOSABS 0.3 0.3 0.2 0.1  BASOSABS 0.0 0.0 0.1 0.0   ------------------------------------------------------------------------------------------------------------------  Chemistries   Recent Labs Lab 08/22/14 0500 08/23/14 0420 08/23/14 0500 08/24/14 0530 08/24/14 1000 08/25/14 1458  NA 131*  --  134* 131* 130* 127*  K 3.7  --  4.4 4.7 4.6 4.7  CL 97*  --  101 98* 95* 93*  CO2 26  --  27 26 27 23   GLUCOSE 255*  --  222* 232* 280* 233*  BUN 39*  --  34* 31* 31* 44*  CREATININE 2.06*  --  1.99* 1.87* 1.81* 2.46*  CALCIUM 8.4*  --  8.7* 8.9 8.8* 8.9  MG  --  2.3  --   --   --   --    ------------------------------------------------------------------------------------------------------------------ estimated creatinine clearance is 37.3 mL/min (by C-G formula based on Cr of 2.46). ------------------------------------------------------------------------------------------------------------------ No results for input(s): TSH, T4TOTAL, T3FREE, THYROIDAB in the last 72 hours.  Invalid input(s): FREET3  Lab Results  Component Value Date   HGBA1C 7.7* 06/26/2014    Coagulation profile  Recent Labs Lab 08/25/14 1534  INR 1.61*   ------------------------------------------------------------------------------------------------------------------- No results for input(s): DDIMER in the last 72  hours. -------------------------------------------------------------------------------------------------------------------  Cardiac Enzymes No results for input(s): CKMB, TROPONINI, MYOGLOBIN in the last 168 hours.  Invalid input(s): CK ------------------------------------------------------------------------------------------------------------------ Invalid input(s): POCBNP   ---------------------------------------------------------------------------------------------------------------  Urinalysis    Component Value Date/Time   COLORURINE AMBER* 08/25/2014 1612   APPEARANCEUR CLEAR 08/25/2014 1612   LABSPEC 1.015 08/25/2014 1612   PHURINE 5.0 08/25/2014 1612   GLUCOSEU NEGATIVE 08/25/2014 1612   HGBUR NEGATIVE 08/25/2014 1612   BILIRUBINUR SMALL* 08/25/2014 1612   KETONESUR 15* 08/25/2014 1612   PROTEINUR NEGATIVE 08/25/2014 1612   UROBILINOGEN 1.0 08/25/2014 1612   NITRITE NEGATIVE 08/25/2014 1612   LEUKOCYTESUR NEGATIVE 08/25/2014 1612    ----------------------------------------------------------------------------------------------------------------   Imaging Results:    Ct Head Wo Contrast  08/25/2014   CLINICAL DATA:  [place] out of bed.  Left hip pain.  EXAM: CT HEAD WITHOUT CONTRAST  TECHNIQUE: Contiguous axial images were obtained from the base of the skull through the vertex without intravenous contrast.  COMPARISON:  02/01/2014  FINDINGS: Remote right posterior MCA territory infarct is identified with encephalomalacia involving the posterior right parietal lobe. This appears similar to previous exam. Mild low attenuation within the periventricular and subcortical white matter noted compatible with chronic microvascular disease. Prominence of the sulci and ventricles noted. No acute cortical infarct. No acute intracranial hemorrhage or mass. There is no abnormal extra-axial fluid collections identified. Paranasal sinuses and mastoid air cells are clear. The calvarium is  intact.  IMPRESSION: 1. No acute intracranial abnormalities. 2. Remote right MCA territory infarct. 3. Small vessel ischemic disease and brain atrophy.   Electronically Signed   By: Kerby Moors M.D.   On: 08/25/2014 14:36   Dg Hip Unilat With Pelvis 2-3 Views Left  08/25/2014   CLINICAL DATA:  Status post fall.  Left hip pain.  EXAM: DG HIP (WITH OR WITHOUT PELVIS) 2-3V LEFT  COMPARISON:  None.  FINDINGS: There is generalized osteopenia. There is a nondisplaced femoral neck fracture which extends into the intertrochanteric region. There is no dislocation. There is no other fracture. 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 There are degenerative changes of bilateral sacroiliac joints.  IMPRESSION: Nondisplaced left femoral neck fracture which extends into the intertrochanteric region.   Electronically Signed   By: Kathreen Devoid   On: 08/25/2014 14:17    My personal review of EKG: Rhythm NSR,  no Acute ST changes   Assessment & Plan:     1.Mechanical trip and fall with left femoral intertrochanteric fracture. Orthopedics has been consulted and patient is supposed to go to surgery tomorrow, he has been seen by cardiology already in the ER. With his multiple heart issues he will be a high-risk candidate for perioperative cardiopulmonary complication. This  has been clearly explained to the patient and wife both accept the risk and wanted to proceed with procedure. We will request surgeons and anesthesia team to kindly review cardiology recommendations closely.   2. Severe nonischemic cardiomyopathy with chronic systolic heart failure with an EF of 25%. History of V. tach. Patient has AICD, on home milrinone drip, currently appears compensated, home medications which include beta blocker, hydralazine, milrinone drip, diuretic (hold today), digoxin, amiodarone will all be continued unchanged. He is not on ACE/ARB.   3. Paroxysmal atrial fibrillation. Xaralto on hold since 08/23/2014, a new  beta blocker, amiodarone and digoxin. Cardiology on board. Telemetry monitor.    4. Recent teeth extraction 2 days ago. Xaralto on hold, was supposed to be started on Saturday. Will let cardiology decide on right timing of restarting his anticoagulation. His last dose was on 08/23/2014.   5.ARF on chronic kidney disease stage IV. Baseline creatinine appears to be 1.9, patient actually at this time appears to be dehydrated I think due to his recent surgery 2 days ago. We will skip Demadex dose today, gently hydrated with 50 mL of normal saline for 12 hours. Repeat BMP in the morning. Obtain urine electrolytes tonight. Avoid ACE/RB.    6. Hyponatremia. Plan as a #5 above.    7. Obstructive sleep apnea. Currently not on C Pap, apparently he was supposed to be started on BiPAP for better tolerance as he is claustrophobic in the near future.     8. DM type II. recent A1c of 7.7, will split Lantus dose into twice a day, every 4 HR sliding scale for now. Monitor CBGs closely.     9.Hypothyroidism and pituitary microadenoma. No acute issues, continue home dose Synthroid.       DVT Prophylaxis Xaralto - SCDs    AM Labs Ordered, also please review Full Orders  Family Communication: Admission, patients condition and plan of care including tests being ordered have been discussed with the patient and wife who indicate understanding and agree with the plan and Code Status.  Code Status Full  Likely DC to  SNF  Condition GUARDED     Time spent in minutes : 35    SINGH,PRASHANT K M.D on 08/25/2014 at 5:07 PM  Between 7am to 7pm - Pager - [phone]  After 7pm go to [email or web address] - password [facility]  [facility] - Office  [phone]

## 2014-08-25 NOTE — ED Notes (Signed)
Attempted report x1. 

## 2014-08-25 NOTE — Consult Note (Signed)
CARDIOLOGY CONSULT NOTE  Patient ID: Larry [name] MRN: [id number] DOB/AGE: 62-Jan-1952 62 y.o.  Admit date: 08/25/2014 Primary Physician [name], MD Primary Cardiologist Dr. Marigene Ehlers Chief Complaint   CHF  HPI:  The patient was discharged from the hospital yesterday after hospitalization for right heart cath and dental extractions in preparation for an LVAD.  He has a non ischemic CM with home milrinone.  He fell this morning and now has a left hip fracture.  He reports that he did not have syncope.  He has not had chest pain.  He has had no palpitations.  He denies any acute SOB.  He is now on home O2.  He is week and has been eating little after having more than 20 teeth removed.    Of note he did not yet restart his Xarelto following the teeth extraction.  During his recent hospitalization he did have his milrinone increased.   Hydralazine was increased, revatio was increased and torsemide was increased.  However, is creat is now elevated above the baseline.     Past Medical History  Diagnosis Date  . NICM (nonischemic cardiomyopathy)     a. NICM. EF 2011 25-30%. b. LHC 2009 nonobstructive CAD. c. h/o St. Jude ICD 2012. d. Echo 04/2013 - EF 25-30% with mildly decreased RV systolic function. e. Echo 01/2014: EF 20-25%, inf akinesis, RHC. e. Home milrinone started 4/16.  [name] CAD (coronary artery disease)     a. nonobstructive CAD by cath 2009 & 06/2014.  . Obesity   . CVA (cerebral vascular accident)     CVA 2007 without residual deficit  . Pituitary tumor      (nonfunctionging pituitary microadenoma) s/p gamma knife surgery at [facility] 2009 with neuropathy and retinopathy  . Depression   . Erectile dysfunction   . DDD (degenerative disc disease)     Chronic low back pain.   . OSA (obstructive sleep apnea)     a. 4/16 sleep study with severe OSA  . Monoclonal gammopathy   . CKD (chronic kidney disease), stage III   . 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  Hypogonadotropic hypogonadism   . Polycythemia, secondary     improved/resolved  . Hypercholesterolemia   . Back pain     F/B [name]  . Monoclonal gammopathy of unknown significance     per [name]  . Neck pain     F/B [name]  . PAF (paroxysmal atrial fibrillation) 06/04/10    a. On Xarelto. b. DCCV 02/2014, 04/2014 to NSR.  [name] History of diverticulitis of colon   . Type II or unspecified type diabetes mellitus with neurological manifestations, uncontrolled   . Nonproliferative diabetic retinopathy NOS(362.03)   . Ventricular tachycardia 10/25/13    appropriate ICD shock, VT CL 230-240 msec  . Confusion 02/01/2014  . Hypertension   . AICD (automatic cardioverter/defibrillator) present   . Anxiety   . Peripheral vascular disease     2007  . Hypothyroidism   . PTSD (post-traumatic stress disorder)     a. Anxiety/PTSD from ICD shock  . Lupus anticoagulant positive     a. No h/o DVT. Saw Dr Lindi Adie with hematology.   . Pulmonary hypertension     a. Mixed pulmonary venous hypertension and PAH  . Chronic respiratory failure     a. As of 08/2014 requiring home O2 with ambulation due to desat.    Past Surgical History  Procedure Laterality Date  . Gamma knife surgery for  pituitary tumor    . Tonsillectomy    . Cardiac defibrillator placement  02/19/10    By JA.   . Right heart catheterization N/A 09/17/2013    Procedure: RIGHT HEART CATH;  Surgeon: [name], MD;  Location: [facility] CATH LAB;  Service: Cardiovascular;  Laterality: N/A;  . Cardiac catheterization    . Brain surgery    . Insert / replace / remove pacemaker    . Cardioversion N/A 02/14/2014    Procedure: CARDIOVERSION;  Surgeon: [name], MD;  Location: [place];  Service: Cardiovascular;  Laterality: N/A;  . Cardioversion N/A 05/02/2014    Procedure: CARDIOVERSION;  Surgeon: [name], MD;  Location: [facility] ENDOSCOPY;  Service: Cardiovascular;  Laterality: N/A;  . Right heart catheterization N/A  05/08/2014    Procedure: RIGHT HEART CATH;  Surgeon: [name], MD;  Location: [facility] CATH LAB;  Service: Cardiovascular;  Laterality: N/A;  . Cardiac catheterization N/A 06/26/2014    Procedure: Right/Left Heart Cath And Coronary Angiography;  Surgeon: [name], MD;  Location: [place] CV LAB;  Service: Cardiovascular;  Laterality: N/A;  . Cardiac catheterization N/A 08/21/2014    Procedure: Right Heart Cath;  Surgeon: [name], MD;  Location: [place] CV LAB;  Service: Cardiovascular;  Laterality: N/A;  . Multiple extractions with alveoloplasty N/A 08/22/2014    Procedure: Extraction of toothy #'s 3,5,6,7,9,10,11,12,13,14,20,21,22,23,27,28,29,30 with alveoloplasty and bilateral mandibular tori reductions;  Surgeon: Lenn Cal, DDS;  Location: [place];  Service: Oral Surgery;  Laterality: N/A;  . Cataract extraction      Allergies  Allergen Reactions  . Bydureon [Exenatide] Other (See Comments)    sweating  . Losartan Potassium Other (See Comments)    insomnia    (Not in a hospital admission) Family History  Problem Relation Age of Onset  . Lung cancer    . Cancer Mother     skin  . Dementia Mother   . Depression Mother   . Heart disease Father 17  . Hypertension Father   . Lung cancer Father   . Obesity Father   . Obesity Sister   . Hypertension Sister   . Diabetes Brother   . Hypertension Brother   . Heart disease Brother 4    Died of stroke MI age 63  . Obesity Brother   . Lung cancer Paternal Uncle   . Diabetes Paternal Uncle     requiring leg amputations     History   Social History  . Marital Status: Married    Spouse Name: N/A  . Number of Children: 1  . Years of Education: N/A   Occupational History  . Disabled    Social History Main Topics  . Smoking status: Former Smoker    Types: Cigars    Quit date: 02/09/1983  . Smokeless tobacco: Never Used     Comment: 25 yrs ago  . Alcohol Use: No  . Drug Use: No  . Sexual Activity:  No   Other Topics Concern  . Not on file   Social History Narrative   Lives with wife.       ROS:  Back pain.  Otherwise  As stated in the HPI and negative for all other systems.  Physical Exam: Blood pressure 131/53, pulse 83, temperature 98.3 F (36.8 C), temperature source Oral, resp. rate 20, height 5\' 10"  (1.778 m), weight 225 lb (102.059 kg), SpO2 94 %.  GENERAL:  Chronically ill appearing HEENT:  Pupils  equal round and reactive, fundi not visualized, oral mucosa unremarkable NECK:  Jugular venous distention not assessed at the patient is lying flat, waveform within normal limits, carotid upstroke brisk and symmetric, no bruits, no thyromegaly LYMPHATICS:  No cervical, inguinal adenopathy LUNGS:  Clear to auscultation bilaterally BACK:  No CVA tenderness CHEST:  ICD pocket HEART:  PMI not displaced or sustained,S1 and S2 within normal limits, no S3, no S4, no clicks, no rubs, no murmurs ABD:  Flat, positive bowel sounds normal in frequency in pitch, no bruits, no rebound, no guarding, no midline pulsatile mass, no hepatomegaly, no splenomegaly EXT:  2 plus pulses throughout, trace edema, no cyanosis no clubbing SKIN:  No rashes no nodules NEURO:  Cranial nerves II through XII grossly intact, motor grossly intact throughout PSYCH:  Cognitively intact, oriented to person place and time, somnolent.     Labs: Lab Results  Component Value Date   BUN 44* 08/25/2014   Lab Results  Component Value Date   CREATININE 2.46* 08/25/2014   Lab Results  Component Value Date   NA 127* 08/25/2014   K 4.7 08/25/2014   CL 93* 08/25/2014   CO2 23 08/25/2014    Lab Results  Component Value Date   WBC 8.5 08/25/2014   HGB 9.9* 08/25/2014   HCT 30.7* 08/25/2014   MCV 95.6 08/25/2014   PLT 241 08/25/2014     EKG:  NSR, rate 83, RAD, IVCD, profound first degree AV block.  08/25/2014    ASSESSMENT AND PLAN:   PREOP:  The patient is at high risk for the procedure given his  severe CHF and cardiorenal syndrome.  I have talked to him and his wife about this.  We will follow for CHF management.  Continue milrinone at recently increased dose.    CKD:  Creat is elevated above the baseline.   I agree with holding his Torsemide tonight.  He has not been taking in POs and likely has low volume although this is certainly tenuous.  HF team will follow in the AM.      Signed: Minus Breeding 08/25/2014, 5:18 PM

## 2014-08-25 NOTE — ED Notes (Signed)
Attempted report x 2 

## 2014-08-25 NOTE — ED Notes (Signed)
Notified bed placement that pt needs bed on 3E per Cardiology.

## 2014-08-25 NOTE — ED Provider Notes (Signed)
CSN: [id number]     Arrival date & time 08/25/14  1308 History   First MD Initiated Contact with Patient 08/25/14 1315     Chief Complaint  Patient presents with  . Hip Pain      HPI  His emergency evaluation left hip pain. Has a history of nonischemic cardiomyopathy. On home 24-hour milrinone infusion. Recent admission, discharged yesterday. Evaluating for possibility of LVAD. Head left and right heart cath's. Was at home this morning. Started to get out of bed. Lost his balance and went backwards. Struck his left hip against the footboard of his bed. Complains of severe pain. Patient's wife, and a neighbor able to get him into bed. However he was unable to move due to severe pain. Transferred here by EMS complaining of severe left hip pain.  Past Medical History  Diagnosis Date  . NICM (nonischemic cardiomyopathy)     a. NICM. EF 2011 25-30%. b. LHC 2009 nonobstructive CAD. c. h/o St. Jude ICD 2012. d. Echo 04/2013 - EF 25-30% with mildly decreased RV systolic function. e. Echo 01/2014: EF 20-25%, inf akinesis, RHC. e. Home milrinone started 4/16.  [name] CAD (coronary artery disease)     a. nonobstructive CAD by cath 2009 & 06/2014.  . Obesity   . CVA (cerebral vascular accident)     CVA 2007 without residual deficit  . Pituitary tumor      (nonfunctionging pituitary microadenoma) s/p gamma knife surgery at [facility] 2009 with neuropathy and retinopathy  . Depression   . Erectile dysfunction   . DDD (degenerative disc disease)     Chronic low back pain.   . OSA (obstructive sleep apnea)     a. 4/16 sleep study with severe OSA  . Monoclonal gammopathy   . CKD (chronic kidney disease), stage III   . 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 Hypogonadotropic hypogonadism   . Polycythemia, secondary     improved/resolved  . Hypercholesterolemia   . Back pain     F/B [name]  . Monoclonal gammopathy of unknown significance     per [name]  . Neck pain     F/B [name]  .  PAF (paroxysmal atrial fibrillation) 06/04/10    a. On Xarelto. b. DCCV 02/2014, 04/2014 to NSR.  [name] History of diverticulitis of colon   . Type II or unspecified type diabetes mellitus with neurological manifestations, uncontrolled   . Nonproliferative diabetic retinopathy NOS(362.03)   . Ventricular tachycardia 10/25/13    appropriate ICD shock, VT CL 230-240 msec  . Confusion 02/01/2014  . Hypertension   . AICD (automatic cardioverter/defibrillator) present   . Anxiety   . Peripheral vascular disease     2007  . Chronic systolic CHF (congestive heart failure)   . Hypothyroidism   . PTSD (post-traumatic stress disorder)     a. Anxiety/PTSD from ICD shock  . Lupus anticoagulant positive     a. No h/o DVT. Saw Dr Lindi Adie with hematology.   . Pulmonary hypertension     a. Mixed pulmonary venous hypertension and PAH  . Chronic respiratory failure     a. As of 08/2014 requiring home O2 with ambulation due to desat.   Past Surgical History  Procedure Laterality Date  . Gamma knife surgery for pituitary tumor    . Tonsillectomy    . Cardiac defibrillator placement  02/19/10    By JA.   . Right heart catheterization N/A 09/17/2013    Procedure: RIGHT HEART CATH;  Surgeon: [name], MD;  Location: [facility] CATH LAB;  Service: Cardiovascular;  Laterality: N/A;  . Cardiac catheterization    . Brain surgery    . Insert / replace / remove pacemaker    . Cardioversion N/A 02/14/2014    Procedure: CARDIOVERSION;  Surgeon: [name], MD;  Location: [place];  Service: Cardiovascular;  Laterality: N/A;  . Cardioversion N/A 05/02/2014    Procedure: CARDIOVERSION;  Surgeon: [name], MD;  Location: [facility] ENDOSCOPY;  Service: Cardiovascular;  Laterality: N/A;  . Right heart catheterization N/A 05/08/2014    Procedure: RIGHT HEART CATH;  Surgeon: [name], MD;  Location: [facility] CATH LAB;  Service: Cardiovascular;  Laterality: N/A;  . Cardiac catheterization N/A 06/26/2014    Procedure:  Right/Left Heart Cath And Coronary Angiography;  Surgeon: [name], MD;  Location: [place] CV LAB;  Service: Cardiovascular;  Laterality: N/A;  . Cardiac catheterization N/A 08/21/2014    Procedure: Right Heart Cath;  Surgeon: [name], MD;  Location: [place] CV LAB;  Service: Cardiovascular;  Laterality: N/A;  . Multiple extractions with alveoloplasty N/A 08/22/2014    Procedure: Extraction of toothy #'s 3,5,6,7,9,10,11,12,13,14,20,21,22,23,27,28,29,30 with alveoloplasty and bilateral mandibular tori reductions;  Surgeon: Lenn Cal, DDS;  Location: [place];  Service: Oral Surgery;  Laterality: N/A;   Family History  Problem Relation Age of Onset  . Lung cancer    . Cancer Mother     skin  . Dementia Mother   . Depression Mother   . Heart disease Father   . Hypertension Father   . Lung cancer Father   . Obesity Father   . Obesity Sister   . Hypertension Sister   . Diabetes Brother   . Hypertension Brother   . Heart disease Brother   . Obesity Brother   . Lung cancer Paternal Uncle   . Diabetes Paternal Uncle     requiring leg amputations    History  Substance Use Topics  . Smoking status: Former Smoker    Types: Cigars    Quit date: 02/09/1983  . Smokeless tobacco: Never Used     Comment: 25 yrs ago  . Alcohol Use: No    Review of Systems  Constitutional: Negative for fever, chills, diaphoresis, appetite change and fatigue.  HENT: Negative for mouth sores, sore throat and trouble swallowing.   Eyes: Negative for visual disturbance.  Respiratory: Negative for cough, chest tightness, shortness of breath and wheezing.   Cardiovascular: Negative for chest pain.  Gastrointestinal: Negative for nausea, vomiting, abdominal pain, diarrhea and abdominal distention.  Endocrine: Negative for polydipsia, polyphagia and polyuria.  Genitourinary: Negative for dysuria, frequency and hematuria.  Musculoskeletal: Negative for gait problem.       Left hip pain.   Skin: Negative for color change, pallor and rash.  Neurological: Negative for dizziness, syncope, light-headedness and headaches.  Hematological: Does not bruise/bleed easily.  Psychiatric/Behavioral: Negative for behavioral problems and confusion.      Allergies  Bydureon and Losartan potassium  Home Medications   Prior to Admission medications   Medication Sig Start Date End Date Taking? Authorizing Provider  ALPRAZolam Duanne Moron) 0.5 MG tablet Take 0.5 mg by mouth 3 (three) times daily as needed for anxiety.    Historical Provider, MD  amiodarone (PACERONE) 200 MG tablet Take 1 tablet (200 mg total) by mouth daily. 06/19/14   [name], MD  atorvastatin (LIPITOR) 40 MG tablet TAKE 1 TABLET DAILY 03/18/14  [name], MD  chlorhexidine (PERIDEX) 0.12 % solution Perform mouth rinses twice daily after breakfast and at bedtime. 08/24/14   Dayna N Dunn, PA-C  digoxin (LANOXIN) 0.125 MG tablet Take 0.5 tablets (0.0625 mg total) by mouth daily. 05/01/14   [name], MD  DULoxetine (CYMBALTA) 30 MG capsule Take 60 mg by mouth 2 (two) times daily.     Historical Provider, MD  GLUCAGON EMERGENCY 1 MG injection Inject 1 mg as directed once. For hypotension 01/30/14   Historical Provider, MD  hydrALAZINE (APRESOLINE) 100 MG tablet Take 1 tablet (100 mg total) by mouth 3 (three) times daily. 08/24/14   Dayna N Dunn, PA-C  insulin aspart (NOVOLOG) 100 UNIT/ML injection Inject 30 Units into the skin 3 (three) times daily before meals.     Historical Provider, MD  LANTUS SOLOSTAR 100 UNIT/ML Solostar Pen Inject 32 Units into the skin at bedtime.  11/03/13   Historical Provider, MD  levothyroxine (SYNTHROID, LEVOTHROID) 25 MCG tablet Take 75 mcg by mouth daily before breakfast.  03/06/14   Historical Provider, MD  Melatonin 10 MG TABS Take 10 mg by mouth at bedtime.    Historical Provider, MD  metoprolol succinate (TOPROL-XL) 25 MG 24 hr tablet Take 1 tablet (25 mg total) by mouth 2 (two)  times daily before a meal. 05/29/14   Amy D Clegg, NP  milrinone (PRIMACOR) 20 MG/100ML SOLN infusion Inject 51.2 mcg/min into the vein continuous. (0.99mcg/kg/min) 08/24/14   Dayna N Dunn, PA-C  Multiple Vitamin (MULITIVITAMIN WITH MINERALS) TABS Take 1 tablet by mouth daily.    Historical Provider, MD  oxyCODONE-acetaminophen (PERCOCET/ROXICET) 5-325 MG per tablet Take 1-2 tablets by mouth every 4-6 hours as needed for moderate-severe pain. 08/24/14   Dayna N Dunn, PA-C  Potassium Chloride ER 20 MEQ TBCR Take 20 mEq by mouth 2 (two) times daily. 08/15/14   Dayna N Dunn, PA-C  rivaroxaban (XARELTO) 15 MG TABS tablet Take 1 tablet (15 mg total) by mouth daily with supper. 08/13/14   [name], MD  senna (SENOKOT) 8.6 MG tablet Take 1 tablet by mouth at bedtime.     Historical Provider, MD  sildenafil (REVATIO) 20 MG tablet Take 4 tablets (80 mg total) by mouth 3 (three) times daily. 08/24/14   Dayna N Dunn, PA-C  spironolactone (ALDACTONE) 25 MG tablet TAKE 1 TABLET DAILY 08/14/14   Jettie Booze, MD  torsemide (DEMADEX) 20 MG tablet Take 3 tablets (60 mg total) by mouth 2 (two) times daily. 08/24/14   Dayna N Dunn, PA-C  zolpidem (AMBIEN) 5 MG tablet Take 1 tablet (5 mg total) by mouth at bedtime as needed for sleep. 06/04/14   [name], MD   BP 131/56 mmHg  Pulse 84  Temp(Src) 98.3 F (36.8 C) (Oral)  Resp 25  Ht 5\' 10"  (1.778 m)  Wt 225 lb (102.059 kg)  BMI 32.28 kg/m2  SpO2 96% Physical Exam  Constitutional: He is oriented to person, place, and time. He appears well-developed and well-nourished. No distress.  HENT:  Head: Normocephalic.  Eyes: Conjunctivae are normal. Pupils are equal, round, and reactive to light. No scleral icterus.  Neck: Normal range of motion. Neck supple. No thyromegaly present.  Cardiovascular: Normal rate and regular rhythm.  Exam reveals no gallop and no friction rub.   No murmur heard. Pulmonary/Chest: Effort normal and breath sounds normal. No  respiratory distress. He has no wheezes. He has no rales.  Abdominal: Soft. Bowel sounds are  normal. He exhibits no distension. There is no tenderness. There is no rebound.  Musculoskeletal: Normal range of motion.  Terrace impaired left hip. Not foreshortened. Minimally externally rotated. Pain is almost any attempted movement of the left hip.  Neurological: He is alert and oriented to person, place, and time.  Skin: Skin is warm and dry. No rash noted.  Psychiatric: He has a normal mood and affect. His behavior is normal.    ED Course  Procedures (including critical care time) Labs Review Labs Reviewed  CBC WITH DIFFERENTIAL/PLATELET - Abnormal; Notable for the following:    RBC 3.21 (*)    Hemoglobin 9.9 (*)    HCT 30.7 (*)    Neutrophils Relative % 80 (*)    Lymphocytes Relative 9 (*)    All other components within normal limits  URINE CULTURE  BASIC METABOLIC PANEL  URINALYSIS, ROUTINE W REFLEX MICROSCOPIC (NOT AT [facility])  PROTIME-INR    Imaging Review Ct Head Wo Contrast  08/25/2014   CLINICAL DATA:  [place] out of bed.  Left hip pain.  EXAM: CT HEAD WITHOUT CONTRAST  TECHNIQUE: Contiguous axial images were obtained from the base of the skull through the vertex without intravenous contrast.  COMPARISON:  02/01/2014  FINDINGS: Remote right posterior MCA territory infarct is identified with encephalomalacia involving the posterior right parietal lobe. This appears similar to previous exam. Mild low attenuation within the periventricular and subcortical white matter noted compatible with chronic microvascular disease. Prominence of the sulci and ventricles noted. No acute cortical infarct. No acute intracranial hemorrhage or mass. There is no abnormal extra-axial fluid collections identified. Paranasal sinuses and mastoid air cells are clear. The calvarium is intact.  IMPRESSION: 1. No acute intracranial abnormalities. 2. Remote right MCA territory infarct. 3. Small vessel ischemic disease  and brain atrophy.   Electronically Signed   By: Kerby Moors M.D.   On: 08/25/2014 14:36   Dg Hip Unilat With Pelvis 2-3 Views Left  08/25/2014   CLINICAL DATA:  Status post fall.  Left hip pain.  EXAM: DG HIP (WITH OR WITHOUT PELVIS) 2-3V LEFT  COMPARISON:  None.  FINDINGS: There is generalized osteopenia. There is a nondisplaced femoral neck fracture which extends into the intertrochanteric region. There is no dislocation. There is no other fracture. 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 There are degenerative changes of bilateral sacroiliac joints.  IMPRESSION: Nondisplaced left femoral neck fracture which extends into the intertrochanteric region.   Electronically Signed   By: Kathreen Devoid   On: 08/25/2014 14:17     EKG Interpretation None      MDM   Final diagnoses:  Fall  Intertrochanteric fracture of left hip, closed, initial encounter    X-ray show a left intertrochanteric hip fracture. Minimally displaced. Patient given IV pain medications. Remainder of his workup is pending. Calls placed to orthopedic surgery, hospitalist, cardiology. He is stable.    Tanna Furry, MD 08/25/14 1534

## 2014-08-25 NOTE — ED Notes (Addendum)
Pt fell out of bed around 5:30 am . Pt complaining of L hip pain. Possibly dislocation per EMS. Pt is on milrinone drip and in process of being evaluated for LVAD. EMS gave 120mcg fentanyl IM. 88% on room air. Pt needs to be on home O2 but has not been wearing it. 97% on 4L [place]. BP 136/70, HR 84, CBG 290

## 2014-08-25 NOTE — Consult Note (Signed)
I have reviewed his x-rays and history.  Tentative plan is for IM nail of his left hip fracture on Monday.   Please make him NPO at midnight and hold anticoagulation tomorrow  Formal c/s to follow.    Edmonia Lynch D Cell: [phone]

## 2014-08-25 NOTE — ED Notes (Signed)
Phlebotomy at bedside to draw labs. Unable to use PICC due to milrinone drip.

## 2014-08-25 NOTE — ED Notes (Signed)
Pt in radiology 

## 2014-08-26 ENCOUNTER — Inpatient Hospital Stay (HOSPITAL_COMMUNITY): Payer: Commercial Managed Care - HMO

## 2014-08-26 ENCOUNTER — Encounter (HOSPITAL_COMMUNITY)
Admission: EM | Disposition: A | Discharge status: Skilled Nursing Facility | Payer: Self-pay | Source: Home / Self Care | Attending: Internal Medicine

## 2014-08-26 ENCOUNTER — Inpatient Hospital Stay (HOSPITAL_COMMUNITY): Payer: Commercial Managed Care - HMO | Admitting: Anesthesiology

## 2014-08-26 ENCOUNTER — Encounter (HOSPITAL_COMMUNITY): Payer: Self-pay | Admitting: Anesthesiology

## 2014-08-26 DIAGNOSIS — I480 Paroxysmal atrial fibrillation: Secondary | ICD-10-CM

## 2014-08-26 DIAGNOSIS — S72302D Unspecified fracture of shaft of left femur, subsequent encounter for closed fracture with routine healing: Secondary | ICD-10-CM

## 2014-08-26 HISTORY — PX: FEMUR IM NAIL: SHX1597

## 2014-08-26 LAB — BASIC METABOLIC PANEL
ANION GAP: 9 (ref 5–15)
BUN: 47 mg/dL — AB (ref 6–20)
CHLORIDE: 95 mmol/L — AB (ref 101–111)
CO2: 25 mmol/L (ref 22–32)
Calcium: 8.6 mg/dL — ABNORMAL LOW (ref 8.9–10.3)
Creatinine, Ser: 2.29 mg/dL — ABNORMAL HIGH (ref 0.61–1.24)
GFR calc Af Amer: 33 mL/min — ABNORMAL LOW (ref >60)
GFR calc non Af Amer: 29 mL/min — ABNORMAL LOW (ref >60)
GLUCOSE: 172 mg/dL — AB (ref 65–99)
Potassium: 4.6 mmol/L (ref 3.5–5.1)
Sodium: 129 mmol/L — ABNORMAL LOW (ref 135–145)

## 2014-08-26 LAB — GLUCOSE, CAPILLARY
GLUCOSE-CAPILLARY: 196 mg/dL — AB (ref 65–99)
GLUCOSE-CAPILLARY: 156 mg/dL — AB (ref 65–99)
GLUCOSE-CAPILLARY: 186 mg/dL — AB (ref 65–99)
GLUCOSE-CAPILLARY: 201 mg/dL — AB (ref 65–99)
Glucose-Capillary: 187 mg/dL — ABNORMAL HIGH (ref 65–99)
Glucose-Capillary: 184 mg/dL — ABNORMAL HIGH (ref 65–99)
Glucose-Capillary: 210 mg/dL — ABNORMAL HIGH (ref 65–99)

## 2014-08-26 LAB — CBC
HCT: 27.5 % — ABNORMAL LOW (ref 39.0–52.0)
Hemoglobin: 8.8 g/dL — ABNORMAL LOW (ref 13.0–17.0)
MCH: 30.3 pg (ref 26.0–34.0)
MCHC: 32 g/dL (ref 30.0–36.0)
MCV: 94.8 fL (ref 78.0–100.0)
Platelets: 209 10*3/uL (ref 150–400)
RBC: 2.9 MIL/uL — AB (ref 4.22–5.81)
RDW: 14.6 % (ref 11.5–15.5)
WBC: 7.5 10*3/uL (ref 4.0–10.5)

## 2014-08-26 LAB — URINE CULTURE: Culture: NO GROWTH

## 2014-08-26 LAB — PREPARE RBC (CROSSMATCH)

## 2014-08-26 LAB — MAGNESIUM: MAGNESIUM: 2.1 mg/dL (ref 1.7–2.4)

## 2014-08-26 SURGERY — INSERTION, INTRAMEDULLARY ROD, FEMUR
Anesthesia: General | Site: Hip | Laterality: Left

## 2014-08-26 MED ORDER — ALBUMIN HUMAN 5 % IV SOLN
INTRAVENOUS | Status: DC | PRN
  Administered 2014-08-26: 16:00:00 via INTRAVENOUS

## 2014-08-26 MED ORDER — SODIUM CHLORIDE 0.9 % IV SOLN: Freq: Once | INTRAVENOUS | Status: DC

## 2014-08-26 MED ORDER — CEFAZOLIN SODIUM-DEXTROSE 2-3 GM-% IV SOLR
2.0000 g | Freq: Four times a day (QID) | INTRAVENOUS | Status: AC
  Administered 2014-08-26 – 2014-08-27 (×2): 2 g via INTRAVENOUS
  Filled 2014-08-26 (×2): qty 50

## 2014-08-26 MED ORDER — PROMETHAZINE HCL 25 MG/ML IJ SOLN: 6.2500 mg | INTRAMUSCULAR | Status: DC | PRN

## 2014-08-26 MED ORDER — ONDANSETRON HCL 4 MG/2ML IJ SOLN
INTRAMUSCULAR | Status: DC | PRN
  Administered 2014-08-26: 4 mg via INTRAVENOUS

## 2014-08-26 MED ORDER — SUCCINYLCHOLINE CHLORIDE 20 MG/ML IJ SOLN
INTRAMUSCULAR | Status: DC | PRN
  Administered 2014-08-26: 100 mg via INTRAVENOUS

## 2014-08-26 MED ORDER — SODIUM CHLORIDE 0.9 % IV SOLN
INTRAVENOUS | Status: DC
  Administered 2014-08-26 – 2014-08-29 (×3): via INTRAVENOUS
  Administered 2014-09-15: 10 mL via INTRAVENOUS
  Administered 2014-10-01: 12:00:00 via INTRAVENOUS

## 2014-08-26 MED ORDER — MILRINONE IN DEXTROSE 20 MG/100ML IV SOLN
INTRAVENOUS | Status: DC | PRN
  Administered 2014-08-26: .499 ug/kg/min via INTRAVENOUS

## 2014-08-26 MED ORDER — PHENYLEPHRINE HCL 10 MG/ML IJ SOLN
INTRAMUSCULAR | Status: DC | PRN
  Administered 2014-08-26 (×2): 80 ug via INTRAVENOUS

## 2014-08-26 MED ORDER — FENTANYL CITRATE (PF) 250 MCG/5ML IJ SOLN
INTRAMUSCULAR | Status: AC
  Filled 2014-08-26: qty 5

## 2014-08-26 MED ORDER — FENTANYL CITRATE (PF) 100 MCG/2ML IJ SOLN
INTRAMUSCULAR | Status: DC | PRN
  Administered 2014-08-26: 100 ug via INTRAVENOUS

## 2014-08-26 MED ORDER — FUROSEMIDE 10 MG/ML IJ SOLN
40.0000 mg | Freq: Once | INTRAMUSCULAR | Status: AC
  Administered 2014-08-27: 40 mg via INTRAVENOUS
  Filled 2014-08-26: qty 4

## 2014-08-26 MED ORDER — FENTANYL CITRATE (PF) 100 MCG/2ML IJ SOLN
100.0000 ug | Freq: Once | INTRAMUSCULAR | Status: AC
  Administered 2014-08-26: 100 ug via INTRAVENOUS

## 2014-08-26 MED ORDER — 0.9 % SODIUM CHLORIDE (POUR BTL) OPTIME
TOPICAL | Status: DC | PRN
  Administered 2014-08-26: 1000 mL

## 2014-08-26 MED ORDER — ARTIFICIAL TEARS OP OINT
TOPICAL_OINTMENT | OPHTHALMIC | Status: DC | PRN
  Administered 2014-08-26: 1 via OPHTHALMIC

## 2014-08-26 MED ORDER — HYDROCODONE-ACETAMINOPHEN 5-325 MG PO TABS
1.0000 | ORAL_TABLET | Freq: Four times a day (QID) | ORAL | Status: DC | PRN
  Administered 2014-08-26 – 2014-08-27 (×2): 2 via ORAL
  Filled 2014-08-26 (×2): qty 2

## 2014-08-26 MED ORDER — MILRINONE IN DEXTROSE 20 MG/100ML IV SOLN
0.3750 ug/kg/min | INTRAVENOUS | Status: AC
  Filled 2014-08-26 (×2): qty 100

## 2014-08-26 MED ORDER — FENTANYL CITRATE (PF) 100 MCG/2ML IJ SOLN
INTRAMUSCULAR | Status: AC
  Filled 2014-08-26: qty 2

## 2014-08-26 MED ORDER — HYDROMORPHONE HCL 1 MG/ML IJ SOLN
0.2500 mg | INTRAMUSCULAR | Status: DC | PRN
  Administered 2014-08-26: 0.5 mg via INTRAVENOUS

## 2014-08-26 MED ORDER — GLYCOPYRROLATE 0.2 MG/ML IJ SOLN
INTRAMUSCULAR | Status: DC | PRN
  Administered 2014-08-26: 0.3 mg via INTRAVENOUS

## 2014-08-26 MED ORDER — HYDROCODONE-ACETAMINOPHEN 5-325 MG PO TABS: 1.0000 | ORAL_TABLET | Freq: Four times a day (QID) | ORAL | Status: DC | PRN

## 2014-08-26 MED ORDER — HYDROMORPHONE HCL 1 MG/ML IJ SOLN
INTRAMUSCULAR | Status: AC
  Filled 2014-08-26: qty 1

## 2014-08-26 MED ORDER — NEOSTIGMINE METHYLSULFATE 10 MG/10ML IV SOLN
INTRAVENOUS | Status: DC | PRN
  Administered 2014-08-26: 2.5 mg via INTRAVENOUS

## 2014-08-26 MED ORDER — VECURONIUM BROMIDE 10 MG IV SOLR
INTRAVENOUS | Status: DC | PRN
  Administered 2014-08-26: 2 mg via INTRAVENOUS

## 2014-08-26 MED ORDER — SODIUM CHLORIDE 0.9 % IV SOLN: 10.0000 mL/h | Freq: Once | INTRAVENOUS | Status: DC

## 2014-08-26 MED ORDER — AMIODARONE HCL 200 MG PO TABS
200.0000 mg | ORAL_TABLET | Freq: Every day | ORAL | Status: DC
  Administered 2014-08-27 – 2014-08-31 (×5): 200 mg via ORAL
  Filled 2014-08-26 (×6): qty 1

## 2014-08-26 MED ORDER — ETOMIDATE 2 MG/ML IV SOLN
INTRAVENOUS | Status: DC | PRN
  Administered 2014-08-26: 16 mg via INTRAVENOUS

## 2014-08-26 SURGICAL SUPPLY — 42 items
BNDG COHESIVE 6X5 TAN STRL LF (GAUZE/BANDAGES/DRESSINGS) ×3 IMPLANT
COVER PERINEAL POST (MISCELLANEOUS) ×3 IMPLANT
COVER SURGICAL LIGHT HANDLE (MISCELLANEOUS) ×3 IMPLANT
DRAPE STERI IOBAN 125X83 (DRAPES) IMPLANT
DRSG EMULSION OIL 3X3 NADH (GAUZE/BANDAGES/DRESSINGS) IMPLANT
DRSG MEPILEX BORDER 4X4 (GAUZE/BANDAGES/DRESSINGS) ×6 IMPLANT
DRSG TEGADERM 4X4.75 (GAUZE/BANDAGES/DRESSINGS) IMPLANT
DURAPREP 26ML APPLICATOR (WOUND CARE) ×3 IMPLANT
ELECT REM PT RETURN 9FT ADLT (ELECTROSURGICAL) ×3
ELECTRODE REM PT RTRN 9FT ADLT (ELECTROSURGICAL) ×1 IMPLANT
GAUZE SPONGE 4X4 12PLY STRL (GAUZE/BANDAGES/DRESSINGS) IMPLANT
GLOVE BIO SURGEON STRL SZ7 (GLOVE) ×3 IMPLANT
GLOVE BIO SURGEON STRL SZ7.5 (GLOVE) ×3 IMPLANT
GLOVE BIOGEL PI IND STRL 7.0 (GLOVE) ×1 IMPLANT
GLOVE BIOGEL PI IND STRL 8 (GLOVE) ×1 IMPLANT
GLOVE BIOGEL PI INDICATOR 7.0 (GLOVE) ×2
GLOVE BIOGEL PI INDICATOR 8 (GLOVE) ×2
GOWN STRL REUS W/ TWL LRG LVL3 (GOWN DISPOSABLE) ×1 IMPLANT
GOWN STRL REUS W/TWL LRG LVL3 (GOWN DISPOSABLE) ×2
GUIDEROD T2 3X1000 (ROD) ×3 IMPLANT
K-WIRE  3.2X450M STR (WIRE) ×4
K-WIRE 3.2X450M STR (WIRE) ×2
KIT BASIN OR (CUSTOM PROCEDURE TRAY) ×3 IMPLANT
KIT NAIL LONG 10X380MM (Orthopedic Implant) ×3 IMPLANT
KIT ROOM TURNOVER OR (KITS) ×3 IMPLANT
KWIRE 3.2X450M STR (WIRE) ×2 IMPLANT
LINER BOOT UNIVERSAL DISP (MISCELLANEOUS) IMPLANT
MANIFOLD NEPTUNE II (INSTRUMENTS) ×3 IMPLANT
NS IRRIG 1000ML POUR BTL (IV SOLUTION) ×3 IMPLANT
PACK GENERAL/GYN (CUSTOM PROCEDURE TRAY) ×3 IMPLANT
PAD ARMBOARD 7.5X6 YLW CONV (MISCELLANEOUS) ×6 IMPLANT
PADDING CAST COTTON 6X4 STRL (CAST SUPPLIES) ×3 IMPLANT
SCREW LAG GAMMA 3 95MM (Screw) ×3 IMPLANT
STAPLER VISISTAT 35W (STAPLE) ×3 IMPLANT
SUT ETHILON 3 0 PS 1 (SUTURE) ×6 IMPLANT
SUT MON AB 2-0 CT1 36 (SUTURE) ×3 IMPLANT
SUT VIC AB 0 CT1 27 (SUTURE) ×2
SUT VIC AB 0 CT1 27XBRD ANBCTR (SUTURE) ×1 IMPLANT
TOWEL OR 17X24 6PK STRL BLUE (TOWEL DISPOSABLE) ×3 IMPLANT
TOWEL OR 17X26 10 PK STRL BLUE (TOWEL DISPOSABLE) ×3 IMPLANT
TOWEL OR NON WOVEN STRL DISP B (DISPOSABLE) IMPLANT
WATER STERILE IRR 1000ML POUR (IV SOLUTION) ×3 IMPLANT

## 2014-08-26 NOTE — Addendum Note (Signed)
Addendum  created 08/26/14 1813 by Fidela Juneau, CRNA   Modules edited: Anesthesia LDA, Lines/Drains/Airways Properties Editor   Lines/Drains/Airways Properties Editor:  Properties of line/drain/airway/wound Arterial Line 08/26/14 Left Radial have been modified.

## 2014-08-26 NOTE — Anesthesia Procedure Notes (Signed)
Procedure Name: Intubation Date/Time: 08/26/2014 3:45 PM Performed by: Jacquiline Doe A Pre-anesthesia Checklist: Patient identified, Emergency Drugs available, Suction available, Patient being monitored and Timeout performed Patient Re-evaluated:Patient Re-evaluated prior to inductionOxygen Delivery Method: Circle system utilized Preoxygenation: Pre-oxygenation with 100% oxygen Intubation Type: IV induction and Cricoid Pressure applied Ventilation: Mask ventilation without difficulty Laryngoscope Size: Mac and 4 Grade View: Grade I Tube type: Oral Tube size: 7.5 mm Number of attempts: 1 Airway Equipment and Method: Stylet Placement Confirmation: ETT inserted through vocal cords under direct vision,  positive ETCO2 and breath sounds checked- equal and bilateral Secured at: 23 cm Tube secured with: Tape Dental Injury: Teeth and Oropharynx as per pre-operative assessment

## 2014-08-26 NOTE — Progress Notes (Signed)
Advanced Heart Failure Rounding Note   Subjective:    [name] is a 64 y/o man with history of chronic systolic CHF (nonischemic cardiomyopathy) s/p St. 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 He is on chronic milrinone 0.5 mcg.  Prior to admit he was being considered for LVAD.   Just dischaged 7/16 after dental procedures. Also had RHC with elevate pulmonary pressures. Revatio was increased to 80 mg tid.   Admitted with fall---> broken L  hip . Ortho involved and plan for surgery today. Pleasantly confused after receiving pain medications. Denies SOB.      Objective:   Weight Range:  Vital Signs:   Temp:  [97.9 F (36.6 C)-98.6 F (37 C)] 98.6 F (37 C) (07/18 0518) Pulse Rate:  [81-88] 81 (07/18 0518) Resp:  [18-27] 18 (07/18 0518) BP: (110-137)/(45-90) 127/61 mmHg (07/18 0518) SpO2:  [93 %-99 %] 96 % (07/18 0518) Weight:  [223 lb 12.3 oz (101.5 kg)-225 lb 12.8 oz (102.422 kg)] 225 lb 12.8 oz (102.422 kg) (07/18 0520) Last BM Date: 08/23/14 (pt's norm)  Weight change: Filed Weights   08/25/14 1312 08/25/14 1838 08/26/14 0520  Weight: 225 lb (102.059 kg) 223 lb 12.3 oz (101.5 kg) 225 lb 12.8 oz (102.422 kg)    Intake/Output:   Intake/Output Summary (Last 24 hours) at 08/26/14 1002 Last data filed at 08/26/14 0851  Gross per 24 hour  Intake 1330.63 ml  Output    550 ml  Net 780.63 ml     Physical Exam: General:  Well appearing. No resp difficulty. In bed. Wife at bed side.  HEENT: normal Neck: supple. JVP 6-7 . Carotids 2+ bilat; no bruits. No lymphadenopathy or thryomegaly appreciated. Cor: PMI nondisplaced. Regular rate & rhythm. No rubs, gallops or murmurs. Lungs: clear Abdomen: soft, nontender, nondistended. No hepatosplenomegaly. No bruits or masses. Good bowel sounds. Extremities: no cyanosis, clubbing, rash, edema. RUE PICC dual lumen  Neuro: Pleasantly confused. Cranial  nerves grossly intact. moves all 4 extremities w/o difficulty. Affect pleasant  Telemetry: NSR PVCs. Long PR   Labs: Basic Metabolic Panel:  Recent Labs Lab 08/23/14 0420 08/23/14 0500 08/24/14 0530 08/24/14 1000 08/25/14 1458 08/26/14 0605  NA  --  134* 131* 130* 127* 129*  K  --  4.4 4.7 4.6 4.7 4.6  CL  --  101 98* 95* 93* 95*  CO2  --  27 26 27 23 25   GLUCOSE  --  222* 232* 280* 233* 172*  BUN  --  34* 31* 31* 44* 47*  CREATININE  --  1.99* 1.87* 1.81* 2.46* 2.29*  CALCIUM  --  8.7* 8.9 8.8* 8.9 8.6*  MG 2.3  --   --   --   --  2.1    Liver Function Tests: No results for input(s): AST, ALT, ALKPHOS, BILITOT, PROT, ALBUMIN in the last 168 hours. No results for input(s): LIPASE, AMYLASE in the last 168 hours. No results for input(s): AMMONIA in the last 168 hours.  CBC:  Recent Labs Lab 08/22/14 0506 08/23/14 0500 08/24/14 0530 08/25/14 1458 08/26/14 0605  WBC 5.1 7.2 9.3 8.5 7.5  NEUTROABS 3.4 5.2 7.2 6.8  --   HGB 9.7* 10.5* 10.8* 9.9* 8.8*  HCT 30.3* 33.0* 33.3* 30.7* 27.5*  MCV 99.7 98.5 98.8 95.6 94.8  PLT 235 257 285 241 209    Cardiac Enzymes: No results for input(s): CKTOTAL, CKMB, CKMBINDEX, TROPONINI in the last  168 hours.  BNP: BNP (last 3 results)  Recent Labs  05/22/14 1029 05/27/14 1126 06/19/14 1030  BNP 935.2* 1627.8* 738.9*    ProBNP (last 3 results)  Recent Labs  10/09/13 0926 11/14/13 0926 01/14/14 1112  PROBNP 1561.0* 1261.0* 5769.0*      Other results:  Imaging: Ct Head Wo Contrast  08/25/2014   CLINICAL DATA:  [place] out of bed.  Left hip pain.  EXAM: CT HEAD WITHOUT CONTRAST  TECHNIQUE: Contiguous axial images were obtained from the base of the skull through the vertex without intravenous contrast.  COMPARISON:  02/01/2014  FINDINGS: Remote right posterior MCA territory infarct is identified with encephalomalacia involving the posterior right parietal lobe. This appears similar to previous exam. Mild low attenuation  within the periventricular and subcortical white matter noted compatible with chronic microvascular disease. Prominence of the sulci and ventricles noted. No acute cortical infarct. No acute intracranial hemorrhage or mass. There is no abnormal extra-axial fluid collections identified. Paranasal sinuses and mastoid air cells are clear. The calvarium is intact.  IMPRESSION: 1. No acute intracranial abnormalities. 2. Remote right MCA territory infarct. 3. Small vessel ischemic disease and brain atrophy.   Electronically Signed   By: Kerby Moors M.D.   On: 08/25/2014 14:36   Chest Portable 1 View  08/25/2014   CLINICAL DATA:  Shortness of Breath  EXAM: PORTABLE CHEST - 1 VIEW  COMPARISON:  08/22/2014  FINDINGS: Cardiac shadow is enlarged. Pacing device is again seen and stable. A right-sided PICC line is again noted in the mid superior vena cava. The lungs are well aerated bilaterally without focal infiltrate or sizable effusion. No acute bony abnormality is seen.  IMPRESSION: No acute abnormality noted.  No change from the prior exam.   Electronically Signed   By: Inez Catalina M.D.   On: 08/25/2014 19:42   Dg Hip Unilat With Pelvis 2-3 Views Left  08/25/2014   CLINICAL DATA:  Status post fall.  Left hip pain.  EXAM: DG HIP (WITH OR WITHOUT PELVIS) 2-3V LEFT  COMPARISON:  None.  FINDINGS: There is generalized osteopenia. There is a nondisplaced femoral neck fracture which extends into the intertrochanteric region. There is no dislocation. There is no other fracture. 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 There are degenerative changes of bilateral sacroiliac joints.  IMPRESSION: Nondisplaced left femoral neck fracture which extends into the intertrochanteric region.   Electronically Signed   By: Kathreen Devoid   On: 08/25/2014 14:17      Medications:     Scheduled Medications: . ALPRAZolam  0.5 mg Oral QHS  . amiodarone  200 mg Oral Daily  . bisacodyl  5 mg Oral QHS  .  ceFAZolin (ANCEF)  IV  2 g Intravenous To OR  . digoxin  0.0625 mg Oral Daily  . DULoxetine  60 mg Oral BID  . hydrALAZINE  100 mg Oral TID  . insulin aspart  0-9 Units Subcutaneous Q4H  . insulin glargine  15 Units Subcutaneous BID  . levothyroxine  75 mcg Oral QAC breakfast  . metoprolol succinate  25 mg Oral BID AC  . potassium chloride SA  20 mEq Oral BID  . sildenafil  80 mg Oral TID  . torsemide  60 mg Oral BID     Infusions: . 0.45 % NaCl with KCl 20 mEq / L 100 mL/hr at 08/26/14 0643  . milrinone 0.5 mcg/kg/min (08/26/14 0943)     PRN Medications:  albuterol, ALPRAZolam **AND** ALPRAZolam,  morphine injection, ondansetron (ZOFRAN) IV, oxyCODONE-acetaminophen, polyethylene glycol, sodium chloride, zolpidem   Assessment:  1. Left Hip Fracture  2. Chronic systolic CHF- on home milrinone 0.39mcg 3. CKD stage III-  4.  Paroxysmal atrial fibrillation requiring DCCV 02/2014, 04/2014- on chronic amio and xarelto. CHADS VASc Score- 5 5. Diabetes mellitus type 2 6. Depression/anxiety 7. OSA, complex- Needs Bipap at night but had difficulty after dental procedure.  8 Pulmonary HTN - Mixed pulmonary venous hypertension and PAH- on  9. Dental extractions 08/2014 by Dr Enrique Sack 10. Obesity Body mass index is 32.39 kg/(m^2). 11. Probable chronic respiratory failure - requiring home O2 with ambulation as of this admission 12. Hyponatremia - NA 129    Plan/Discussion:    Plan for  L Hip Fractrue repair today. He is at high surgical risk for surgery given advanced HF and elevated pulmonary pressures.    Post op will need ICU/stepdown on 2h. Xarelto and torsemide on hold for surgery. Restart xarelto once ok with ortho.   Continue milrinone 0.5 mcg, dig 0.0625 mg. Off diuretics for now. Will adjust diuretics after surgery .   Recent admit revatio was increased to 80 mg tid due elevated PVR >5 WU. Needs to try and use Bipap tonight. Hopefully pain from dental extractions wont be an issue.   Sinus Rhythm  with PVCs. Long PR interval noted on EKG . Stop metoprolol . Will address tomorrow.   Length of Stay: 1   CLEGG,AMY NP-C  08/26/2014, 10:02 AM  Advanced Heart Failure Team Pager [phone] (M-F; 7a - 4p)  Please contact [facility] for night-coverage after hours (4p -7a ) and weekends on [email or web address]  Patient seen and examined with Darrick Grinder, NP. We discussed all aspects of the encounter. I agree with the assessment and plan as stated above.   Given advanced HF and pulmonary HTN he is at high, but not prohibitive risk, for surgery to repair hip fracture. Given the fact that surgery is necessary to preserve his mobility we will have him proceed with surgery today. He was recently able to tolerate GA (on 08/22/14) for his teeth extractions without too much difficulty so that is somewhat reassuring. Risk of worsening HF and death discussed with him and his wife. Agree with continuing milrinone. Avoid fluid loading. Hold Xarelto for now.   Wahid Holley,MD 11:31 AM

## 2014-08-26 NOTE — H&P (View-Only) (Signed)
ORTHOPAEDIC CONSULTATION  REQUESTING PHYSICIAN: Thurnell Lose, MD  Chief Complaint: Left hip fracture   HPI: Larry [name] is a 64 y.o. male who complains of left hip pain after sustaining a mechanical fall at home this morning.  He presented to the ER for evaluation after being unable to ambulate without pain.  Prior to the fall, the patient was abulatory without any assistive devices.    He has a significant cardiac history that makes him unable to walk more than 177f without feeling short of breath.  The patient has a dual chamber pacemaker.  He is on Xarelto for paroxysmal Afib.  He is also on a milrinone drip for chronic severe systolic heart failure. He has also recently undergone removal of his teeth in preporation of possible LVAD procedure.  The patient's medical history also includes DM type II, OSA, nonocclusive CAD with recent cath 06/2014, and chronic kidney disease stage IV.  Past Medical History  Diagnosis Date  . NICM (nonischemic cardiomyopathy)     a. NICM. EF 2011 25-30%. b. LHC 2009 nonobstructive CAD. c. h/o St. Jude ICD 2012. d. Echo 04/2013 - EF 25-30% with mildly decreased RV systolic function. e. Echo 01/2014: EF 20-25%, inf akinesis, RHC. e. Home milrinone started 4/16.  .Marland KitchenCAD (coronary artery disease)     a. nonobstructive CAD by cath 2009 & 06/2014.  . Obesity   . CVA (cerebral vascular accident)     CVA 2007 without residual deficit  . Pituitary tumor      (nonfunctionging pituitary microadenoma) [facility] and [facility]opathy  . Depression   . Erectile dysfunction   . DDD (degenerative disc disease)     Chronic low back pain.   . OSA (obstructive sleep apnea)     a. 4/16 sleep study with severe OSA  . Monoclonal gammopathy   . CKD (chronic kidney disease), stage III   . 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 Hypogonadotropic hypogonadism   . Polycythemia, secondary     improved/resolved  .  Hypercholesterolemia   . Back pain     F/B Dr. RNelva Bush . Monoclonal gammopathy of unknown significance     per Dr. MMercy Moore . Neck pain     F/B Dr. RNelva Bush . PAF (paroxysmal atrial fibrillation) 06/04/10    a. On Xarelto. b. DCCV 02/2014, 04/2014 to NSR.  .Marland KitchenHistory of diverticulitis of colon   . Type II or unspecified type diabetes mellitus with neurological manifestations, uncontrolled   . Nonproliferative diabetic retinopathy NOS(362.03)   . Ventricular tachycardia 10/25/13    appropriate ICD shock, VT CL 230-240 msec  . Confusion 02/01/2014  . Hypertension   . AICD (automatic cardioverter/defibrillator) present   . Anxiety   . Peripheral vascular disease     2007  . Hypothyroidism   . PTSD (post-traumatic stress disorder)     a. Anxiety/PTSD from ICD shock  . Lupus anticoagulant positive     a. No h/o DVT. Saw Dr GLindi Adiewith hematology.   . Pulmonary hypertension     a. Mixed pulmonary venous hypertension and PAH  . Chronic respiratory failure     a. As of 08/2014 requiring home O2 with ambulation due to desat.   Past Surgical History  Procedure Laterality Date  . Gamma knife surgery for pituitary tumor    . Tonsillectomy    . Cardiac defibrillator placement  02/19/10    By JA.   .[name]  Right heart catheterization N/A 09/17/2013    Procedure: RIGHT HEART CATH;  Surgeon: [name], MD;  Location: [facility] CATH LAB;  Service: Cardiovascular;  Laterality: N/A;  . Cardiac catheterization    . Brain surgery    . Insert / replace / remove pacemaker    . Cardioversion N/A 02/14/2014    Procedure: CARDIOVERSION;  Surgeon: [name], MD;  Location: [place];  Service: Cardiovascular;  Laterality: N/A;  . Cardioversion N/A 05/02/2014    Procedure: CARDIOVERSION;  Surgeon: [name], MD;  Location: [facility] ENDOSCOPY;  Service: Cardiovascular;  Laterality: N/A;  . Right heart catheterization N/A 05/08/2014    Procedure: RIGHT HEART CATH;  Surgeon: [name], MD;  Location:  [facility] CATH LAB;  Service: Cardiovascular;  Laterality: N/A;  . Cardiac catheterization N/A 06/26/2014    Procedure: Right/Left Heart Cath And Coronary Angiography;  Surgeon: [name], MD;  Location: [place] CV LAB;  Service: Cardiovascular;  Laterality: N/A;  . Cardiac catheterization N/A 08/21/2014    Procedure: Right Heart Cath;  Surgeon: [name], MD;  Location: [place] CV LAB;  Service: Cardiovascular;  Laterality: N/A;  . Multiple extractions with alveoloplasty N/A 08/22/2014    Procedure: Extraction of toothy #'s 3,5,6,7,9,10,11,12,13,14,20,21,22,23,27,28,29,30 with alveoloplasty and bilateral mandibular tori reductions;  Surgeon: Lenn Cal, DDS;  Location: [place];  Service: Oral Surgery;  Laterality: N/A;  . Cataract extraction     History   Social History  . Marital Status: Married    Spouse Name: N/A  . Number of Children: 1  . Years of Education: N/A   Occupational History  . Disabled    Social History Main Topics  . Smoking status: Former Smoker    Types: Cigars    Quit date: 02/09/1983  . Smokeless tobacco: Never Used     Comment: 25 yrs ago  . Alcohol Use: No  . Drug Use: No  . Sexual Activity: No   Other Topics Concern  . None   Social History Narrative   Lives with wife.     Family History  Problem Relation Age of Onset  . Lung cancer    . Cancer Mother     skin  . Dementia Mother   . Depression Mother   . Heart disease Father 27  . Hypertension Father   . Lung cancer Father   . Obesity Father   . Obesity Sister   . Hypertension Sister   . Diabetes Brother   . Hypertension Brother   . Heart disease Brother 13    Died of stroke MI age 15  . Obesity Brother   . Lung cancer Paternal Uncle   . Diabetes Paternal Uncle     requiring leg amputations    Allergies  Allergen Reactions  . Bydureon [Exenatide] Other (See Comments)    sweating  . Losartan Potassium Other (See Comments)    insomnia   Prior to Admission  medications   Medication Sig Start Date End Date Taking? Authorizing Provider  ALPRAZolam Duanne Moron) 0.5 MG tablet Take 0.5 mg by mouth See admin instructions. Take 1 tablet (0.5 mg) every night, may take an additional tablet two more times during the day as needed for anxiety   Yes Historical Provider, MD  amiodarone (PACERONE) 200 MG tablet Take 1 tablet (200 mg total) by mouth daily. 06/19/14  Yes [name], MD  atorvastatin (LIPITOR) 40 MG tablet TAKE 1 TABLET DAILY 03/18/14  Yes Shaune Pascal Bensimhon,  MD  bisacodyl (DULCOLAX) 5 MG EC tablet Take 5 mg by mouth at bedtime.   Yes Historical Provider, MD  chlorhexidine (PERIDEX) 0.12 % solution Perform mouth rinses twice daily after breakfast and at bedtime. Patient taking differently: Use as directed in the mouth or throat 2 (two) times daily. Perform mouth rinses twice daily after breakfast and at bedtime (swish and spit) 08/24/14  Yes Dayna N Dunn, PA-C  digoxin (LANOXIN) 0.125 MG tablet Take 0.5 tablets (0.0625 mg total) by mouth daily. 05/01/14  Yes [name], MD  DULoxetine (CYMBALTA) 30 MG capsule Take 60 mg by mouth 2 (two) times daily.    Yes Historical Provider, MD  GLUCAGON EMERGENCY 1 MG injection Inject 1 mg as directed once as needed (hypotension).  01/30/14  Yes Historical Provider, MD  hydrALAZINE (APRESOLINE) 100 MG tablet Take 1 tablet (100 mg total) by mouth 3 (three) times daily. 08/24/14  Yes Dayna N Dunn, PA-C  insulin glargine (LANTUS) 100 unit/mL SOPN Inject 30 Units into the skin at bedtime.   Yes Historical Provider, MD  insulin lispro (HUMALOG KWIKPEN) 100 UNIT/ML KiwkPen Inject 30 Units into the skin 3 (three) times daily before meals.   Yes Historical Provider, MD  levothyroxine (SYNTHROID, LEVOTHROID) 75 MCG tablet TAKE 1 TABLET ON AN EMPTY STOMACH 30 MINUTES BEFORE BREAKFAST 07/13/14  Yes Historical Provider, MD  Melatonin 10 MG TABS Take 10 mg by mouth at bedtime as needed (sleep).    Yes Historical Provider, MD    metoprolol succinate (TOPROL-XL) 25 MG 24 hr tablet Take 1 tablet (25 mg total) by mouth 2 (two) times daily before a meal. 05/29/14  Yes Amy D Clegg, NP  milrinone (PRIMACOR) 20 MG/100ML SOLN infusion Inject 51.2 mcg/min into the vein continuous. (0.17mg/kg/min) 08/24/14  Yes Dayna N Dunn, PA-C  Multiple Vitamin (MULITIVITAMIN WITH MINERALS) TABS Take 1 tablet by mouth daily.   Yes Historical Provider, MD  oxyCODONE-acetaminophen (PERCOCET) 10-325 MG per tablet Take 1 tablet by mouth every 4 (four) hours as needed for pain.  08/08/14  Yes Historical Provider, MD  Potassium Chloride ER 20 MEQ TBCR Take 20 mEq by mouth 2 (two) times daily. 08/15/14  Yes Dayna N Dunn, PA-C  rivaroxaban (XARELTO) 15 MG TABS tablet Take 1 tablet (15 mg total) by mouth daily with supper. 08/13/14  Yes DLarey Dresser MD  sildenafil (REVATIO) 20 MG tablet Take 4 tablets (80 mg total) by mouth 3 (three) times daily. 08/24/14  Yes Dayna N Dunn, PA-C  spironolactone (ALDACTONE) 25 MG tablet TAKE 1 TABLET DAILY 08/14/14  Yes JJettie Booze MD  torsemide (DEMADEX) 20 MG tablet Take 3 tablets (60 mg total) by mouth 2 (two) times daily. 08/24/14  Yes Dayna N Dunn, PA-C  zolpidem (AMBIEN) 5 MG tablet Take 1 tablet (5 mg total) by mouth at bedtime as needed for sleep. 06/04/14  Yes DLarey Dresser MD  oxyCODONE-acetaminophen (PERCOCET/ROXICET) 5-325 MG per tablet Take 1-2 tablets by mouth every 4-6 hours as needed for moderate-severe pain. Patient not taking: Reported on 08/25/2014 08/24/14   Dayna N Dunn, PA-C   Ct Head Wo Contrast  08/25/2014   CLINICAL DATA:  F[place] Circleout of bed.  Left hip pain.  EXAM: CT HEAD WITHOUT CONTRAST  TECHNIQUE: Contiguous axial images were obtained from the base of the skull through the vertex without intravenous contrast.  COMPARISON:  02/01/2014  FINDINGS: Remote right posterior MCA territory infarct is identified with encephalomalacia involving the posterior right parietal lobe.  This appears similar to  previous exam. Mild low attenuation within the periventricular and subcortical white matter noted compatible with chronic microvascular disease. Prominence of the sulci and ventricles noted. No acute cortical infarct. No acute intracranial hemorrhage or mass. There is no abnormal extra-axial fluid collections identified. Paranasal sinuses and mastoid air cells are clear. The calvarium is intact.  IMPRESSION: 1. No acute intracranial abnormalities. 2. Remote right MCA territory infarct. 3. Small vessel ischemic disease and brain atrophy.   Electronically Signed   By: Kerby Moors M.D.   On: 08/25/2014 14:36   Chest Portable 1 View  08/25/2014   CLINICAL DATA:  Shortness of Breath  EXAM: PORTABLE CHEST - 1 VIEW  COMPARISON:  08/22/2014  FINDINGS: Cardiac shadow is enlarged. Pacing device is again seen and stable. A right-sided PICC line is again noted in the mid superior vena cava. The lungs are well aerated bilaterally without focal infiltrate or sizable effusion. No acute bony abnormality is seen.  IMPRESSION: No acute abnormality noted.  No change from the prior exam.   Electronically Signed   By: Inez Catalina M.D.   On: 08/25/2014 19:42   Dg Hip Unilat With Pelvis 2-3 Views Left  08/25/2014   CLINICAL DATA:  Status post fall.  Left hip pain.  EXAM: DG HIP (WITH OR WITHOUT PELVIS) 2-3V LEFT  COMPARISON:  None.  FINDINGS: There is generalized osteopenia. There is a nondisplaced femoral neck fracture which extends into the intertrochanteric region. There is no dislocation. There is no other fracture. 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 There are degenerative changes of bilateral sacroiliac joints.  IMPRESSION: Nondisplaced left femoral neck fracture which extends into the intertrochanteric region.   Electronically Signed   By: Kathreen Devoid   On: 08/25/2014 14:17    Positive ROS: All other systems have been reviewed and were otherwise negative with the exception of those mentioned in the HPI  and as above.  Labs cbc  Recent Labs  08/24/14 0530 08/25/14 1458  WBC 9.3 8.5  HGB 10.8* 9.9*  HCT 33.3* 30.7*  PLT 285 241    Labs inflam No results for input(s): CRP in the last 72 hours.  Invalid input(s): ESR  Labs coag  Recent Labs  08/25/14 1534  INR 1.61*     Recent Labs  08/24/14 1000 08/25/14 1458  NA 130* 127*  K 4.6 4.7  CL 95* 93*  CO2 27 23  GLUCOSE 280* 233*  BUN 31* 44*  CREATININE 1.81* 2.46*  CALCIUM 8.8* 8.9    Physical Exam: Filed Vitals:   08/25/14 1838  BP: 127/53  Pulse: 85  Temp: 97.9 F (36.6 C)  Resp: 20   General: Alert, no acute distress Cardiovascular: No pedal edema Respiratory: No cyanosis, no use of accessory musculature GI: No organomegaly, abdomen is soft and non-tender Skin: No lesions in the area of chief complaint other than those listed below in MSK exam.  Neurologic: Sensation intact distally Psychiatric: Patient is competent for consent with normal mood and affect Lymphatic: No axillary or cervical lymphadenopathy  MUSCULOSKELETAL:  Left hip tenderness to palpation and significant pain with ROM and ambulation.  Sensation intact with 2+ distal pulses. Other extremities are atraumatic with painless ROM and NVI.  Assessment: L hip fracture  Plan: We had a long discussion with the patient and his family.  Given his extensive cardiac history and kidney disease, he is high risk for surgery.  We explained that without surgical intervention, the  patient would likely be able to stand and transfer to a chair, but would have difficulty with ambulation.  Since the patient was ambulatory at baseline prior to the fall, the patient would like to proceed with surgical planning.  We will need to have cardiology see the patient and clear for surgery.  The tentative plan is to take to the OR tomorrow for IM nail of the L hip.   Weight Bearing Status: bedrest VTE px: SCD's and chemical prophylaxis on hold till  surgery   [facility] [phone]   08/25/2014 9:06 PM

## 2014-08-26 NOTE — Progress Notes (Signed)
Larry [name] returned from surgery with no issue. 1 unit of packed red blood cells infusing that was hung in PACU. Left Hip dsg clean dry and intact. A small area marked on upper hip dsg. Arterial Line removed at bedside for sending PACU RN. She held pressure and applied a pressure dsg.

## 2014-08-26 NOTE — Op Note (Signed)
DATE OF SURGERY:  08/26/2014  TIME: 5:51 PM  PATIENT NAME:  Larry [name]  AGE: 64 y.o.  PRE-OPERATIVE DIAGNOSIS:  left femur fracture  POST-OPERATIVE DIAGNOSIS:  SAME  PROCEDURE:  INTRAMEDULLARY (IM) NAIL FEMORAL  SURGEON:  MURPHY, TIMOTHY D  ASSISTANT:  Lovett Calender, PA-C, She was present and scrubbed throughout the case, critical for completion in a timely fashion, and for retraction, instrumentation, and closure.   OPERATIVE IMPLANTS: Stryker Gamma Nail with distal interlock screw  PREOPERATIVE INDICATIONS:  [name] is a 64 y.o. year old who fell and suffered a hip fracture. He was brought into the ER and then admitted and optimized and then elected for surgical intervention.    The risks benefits and alternatives were discussed with the patient including but not limited to the risks of nonoperative treatment, versus surgical intervention including infection, bleeding, nerve injury, malunion, nonunion, hardware prominence, hardware failure, need for hardware removal, blood clots, cardiopulmonary complications, morbidity, mortality, among others, and they were willing to proceed.    OPERATIVE PROCEDURE:  The patient was brought to the operating room and placed in the supine position. General anesthesia was administered, with a foley. He was placed on the fracture table.  Closed reduction was performed under C-arm guidance. The length of the femur was also measured using fluoroscopy. Time out was then performed after sterile prep and drape. He received preoperative antibiotics.  Incision was made proximal to the greater trochanter. A guidewire was placed in the appropriate position. Confirmation was made on AP and lateral views. The above-named nail was opened. I opened the proximal femur with a reamer. I then placed the nail by hand easily down. I did not need to ream the femur.  Once the nail was completely seated, I placed a guidepin into the femoral head into the  center center position. I measured the length, and then reamed the lateral cortex and up into the head. I then placed the lag screw. Slight compression was applied. Anatomic fixation achieved. Bone quality was mediocre.  I then secured the proximal interlocking bolt, and took off a half a turn, and then removed the instruments, and took final C-arm pictures AP and lateral the entire length of the leg.   Anatomic reconstruction was achieved, and the wounds were irrigated copiously and closed with Vicryl followed by staples and sterile gauze for the skin. The patient was awakened and returned to PACU in stable and satisfactory condition. There no complications and the patient tolerated the procedure well.  He will be weightbearing as tolerated, and will be on Xarelto post op   Edmonia Lynch, M.D.    This note was generated using a template and dragon dictation system. In light of that, I have reviewed the note and all aspects of it are applicable to this case. Any dictation errors are due to the computerized dictation system.

## 2014-08-26 NOTE — Discharge Instructions (Signed)
INSTRUCTIONS ° °o Remove items at home which could result in a fall. This includes throw rugs or furniture in walking pathways °o ICE to the affected joint every three hours while awake for 30 minutes at a time, for at least the first 3-5 days, and then as needed for pain and swelling.  Continue to use ice for pain and swelling. You may notice swelling that will progress down to the foot and ankle.  This is normal after surgery.  Elevate your leg when you are not up walking on it.   °o Continue to use the breathing machine you got in the hospital (incentive spirometer) which will help keep your temperature down.  It is common for your temperature to cycle up and down following surgery, especially at night when you are not up moving around and exerting yourself.  The breathing machine keeps your lungs expanded and your temperature down. ° ° °DIET:  As you were doing prior to hospitalization, we recommend a well-balanced diet. ° °DRESSING / WOUND CARE / SHOWERING ° °Keep the surgical dressing until follow up.  IF THE DRESSING FALLS OFF or the wound gets wet inside, change the dressing with sterile gauze.  Please use good hand washing techniques before changing the dressing.  Do not use any lotions or creams on the incision until instructed by your surgeon.   ° °ACTIVITY ° °o Increase activity slowly as tolerated, but follow the weight bearing instructions below.   °o No driving for 6 weeks or until further direction given by your physician.  You cannot drive while taking narcotics.  °o No lifting or carrying greater than 10 lbs. until further directed by your surgeon. °o Avoid periods of inactivity such as sitting longer than an hour when not asleep. This helps prevent blood clots.  °o You may return to work once you are authorized by your doctor.  ° ° °WEIGHT BEARING  ° °Weight bearing as tolerated with assist device (walker, cane, etc) as directed, use it as long as suggested by your surgeon or therapist, typically  at least 4-6 weeks. ° ° °CONSTIPATION ° °Constipation is defined medically as fewer than three stools per week and severe constipation as less than one stool per week.  Even if you have a regular bowel pattern at home, your normal regimen is likely to be disrupted due to multiple reasons following surgery.  Combination of anesthesia, postoperative narcotics, change in appetite and fluid intake all can affect your bowels.  ° °YOU MUST use at least one of the following options; they are listed in order of increasing strength to get the job done.  They are all available over the counter, and you may need to use some, POSSIBLY even all of these options:   ° °Drink plenty of fluids (prune juice may be helpful) and high fiber foods °Colace 100 mg by mouth twice a day  °Senokot for constipation as directed and as needed Dulcolax (bisacodyl), take with full glass of water  °Miralax (polyethylene glycol) once or twice a day as needed. ° °If you have tried all these things and are unable to have a bowel movement in the first 3-4 days after surgery call either your surgeon or your primary doctor.   ° °If you experience loose stools or diarrhea, hold the medications until you stool forms back up.  If your symptoms do not get better within 1 week or if they get worse, check with your doctor.  If you experience "the worst   abdominal pain ever" or develop nausea or vomiting, please contact the office immediately for further recommendations for treatment. ° ° °ITCHING:  If you experience itching with your medications, try taking only a single pain pill, or even half a pain pill at a time.  You can also use Benadryl over the counter for itching or also to help with sleep.  ° °TED HOSE STOCKINGS:  Use stockings on both legs until for at least 2 weeks or as directed by physician office. They may be removed at night for sleeping. ° °MEDICATIONS:  See your medication summary on the “After Visit Summary” that nursing will review with you.   You may have some home medications which will be placed on hold until you complete the course of blood thinner medication.  It is important for you to complete the blood thinner medication as prescribed. ° °PRECAUTIONS:  If you experience chest pain or shortness of breath - call 911 immediately for transfer to the hospital emergency department.  ° °If you develop a fever greater that 101 F, purulent drainage from wound, increased redness or drainage from wound, foul odor from the wound/dressing, or calf pain - CONTACT YOUR SURGEON.   °                                                °FOLLOW-UP APPOINTMENTS:  If you do not already have a post-op appointment, please call the office for an appointment to be seen by your surgeon.  Guidelines for how soon to be seen are listed in your “After Visit Summary”, but are typically between 1-4 weeks after surgery. ° °MAKE SURE YOU:  °• Understand these instructions.  °• Get help right away if you are not doing well or get worse.  ° ° °Thank you for letting us be a part of your medical care team.  It is a privilege we respect greatly.  We hope these instructions will help you stay on track for a fast and full recovery!  ° °

## 2014-08-26 NOTE — Transfer of Care (Signed)
Immediate Anesthesia Transfer of Care Note  Patient: Larry [name]  Procedure(s) Performed: Procedure(s): INTRAMEDULLARY (IM) NAIL FEMORAL (Left)  Patient Location: PACU  Anesthesia Type:General  Level of Consciousness: oriented, sedated, patient cooperative and responds to stimulation  Airway & Oxygen Therapy: Patient Spontanous Breathing and Patient connected to nasal cannula oxygen  Post-op Assessment: Report given to RN, Post -op Vital signs reviewed and stable, Patient moving all extremities and Patient moving all extremities X 4  Post vital signs: Reviewed and stable  Last Vitals:  Filed Vitals:   08/26/14 1350  BP: 116/58  Pulse: 84  Temp: 36.7 C  Resp: 18    Complications: No apparent anesthesia complications

## 2014-08-26 NOTE — Progress Notes (Signed)
[facility] PROGRESS NOTE  CARMINO OCAIN QMV:[id number] DOB: 22-Jun-1950 DOA: 08/25/2014 PCP: [name], MD  Assessment/Plan: 1.Mechanical trip and fall with left femoral intertrochanteric fracture. Orthopedics has been consulted and patient is supposed to go to surgery today 7/18 -high risk for perioperative cardiopulmonary complication -IS and flutter valve -will follow ortho post operative rec's  2. Severe nonischemic cardiomyopathy with chronic systolic heart failure with an EF of 25%. History of V. tach. Patient has AICD, on home milrinone drip, currently appears compensated, home medications which include beta blocker, hydralazine, milrinone drip, digoxin and amiodarone will all be continued and with unchanged dose.  -He is not on ACE/ARB due to renal failure -cardiology (heart failure) on board, will follow further rec's  3. Paroxysmal atrial fibrillation. Xarelto on hold since 08/23/2014, continue beta blocker, amiodarone and digoxin.  -Cardiology on board will follow rec's -continue telemetry monitoring.   4. Recent teeth extraction 2 days ago. Xarelto on hold, was supposed to be started on Saturday. Will let cardiology/ortho decide on right timing of restarting his anticoagulation (especially with hip surgery).  5.ARF on chronic kidney disease stage IV. Baseline creatinine appears to be 1.9, patient actually at this time appears to be dehydrated I think due to his recent dental surgery 2 days prior to [place].  -will hold torsemide -heart failure on board, will follow rec's -Avoid ACE/ARB.  -no IVF's -daily weight and strict I's and O's  6. Hyponatremia. Plan as a #5 above.   7. Obstructive sleep apnea. Currently not on C Pap, apparently he was supposed to be started on BiPAP for better tolerance as he is claustrophobic in the near future. -will continue oxygen [place] for now -will follow need to try BIPAP nasal prone while inpatient   8. DM type II. recent  A1c of 7.7 -continue Lantus and SSI -Monitor CBGs closely.   9.Hypothyroidism and pituitary microadenoma. No acute issues, continue home dose Synthroid.    Code Status: Full Family Communication: wife at bedside Disposition Plan: to be determine, remains inpatient    Consultants:  Ortho (Dr. Percell Miller)  HF team   Procedures:  Planned for intramedullary nail on left hip on 7/18  Antibiotics:  None   HPI/Subjective: Afebrile, no CP or SOB. Complaining of LLE pain.  Objective: Filed Vitals:   08/26/14 1350  BP: 116/58  Pulse: 84  Temp: 98.1 F (36.7 C)  Resp: 18    Intake/Output Summary (Last 24 hours) at 08/26/14 1424 Last data filed at 08/26/14 1237  Gross per 24 hour  Intake 1330.63 ml  Output    825 ml  Net 505.63 ml   Filed Weights   08/25/14 1312 08/25/14 1838 08/26/14 0520  Weight: 102.059 kg (225 lb) 101.5 kg (223 lb 12.3 oz) 102.422 kg (225 lb 12.8 oz)    Exam:   General:  Afebrile, slightly confused from pain meds, complaining of pain on his LLE  Cardiovascular:norubs, no gallops, RRR  Respiratory: no crackles, no wheezing   Abdomen: soft, NT, ND, positive BS  Musculoskeletal: no edema, no cyanosis, LLE with pain (worse with movement)  Data Reviewed: Basic Metabolic Panel:  Recent Labs Lab 08/23/14 0420 08/23/14 0500 08/24/14 0530 08/24/14 1000 08/25/14 1458 08/26/14 0605  NA  --  134* 131* 130* 127* 129*  K  --  4.4 4.7 4.6 4.7 4.6  CL  --  101 98* 95* 93* 95*  CO2  --  27 26 27 23 25   GLUCOSE  --  222* 232* 280*  233* 172*  BUN  --  34* 31* 31* 44* 47*  CREATININE  --  1.99* 1.87* 1.81* 2.46* 2.29*  CALCIUM  --  8.7* 8.9 8.8* 8.9 8.6*  MG 2.3  --   --   --   --  2.1   CBC:  Recent Labs Lab 08/22/14 0506 08/23/14 0500 08/24/14 0530 08/25/14 1458 08/26/14 0605  WBC 5.1 7.2 9.3 8.5 7.5  NEUTROABS 3.4 5.2 7.2 6.8  --   HGB 9.7* 10.5* 10.8* 9.9* 8.8*  HCT 30.3* 33.0* 33.3* 30.7* 27.5*  MCV 99.7 98.5 98.8 95.6 94.8   PLT 235 257 285 241 209   BNP (last 3 results)  Recent Labs  05/22/14 1029 05/27/14 1126 06/19/14 1030  BNP 935.2* 1627.8* 738.9*    ProBNP (last 3 results)  Recent Labs  10/09/13 0926 11/14/13 0926 01/14/14 1112  PROBNP 1561.0* 1261.0* 5769.0*   CBG:  Recent Labs Lab 08/25/14 2315 08/26/14 0233 08/26/14 0709 08/26/14 1201 08/26/14 1345  GLUCAP 194* 196* 156* 187* 186*    Recent Results (from the past 240 hour(s))  MRSA PCR Screening     Status: None   Collection Time: 08/21/14  6:50 PM  Result Value Ref Range Status   MRSA by PCR NEGATIVE NEGATIVE Final    Comment:        The GeneXpert MRSA Assay (FDA approved for NASAL specimens only), is one component of a comprehensive MRSA colonization surveillance program. It is not intended to diagnose MRSA infection nor to guide or monitor treatment for MRSA infections.   Surgical pcr screen     Status: None   Collection Time: 08/21/14 10:38 PM  Result Value Ref Range Status   MRSA, PCR NEGATIVE NEGATIVE Final   Staphylococcus aureus NEGATIVE NEGATIVE Final    Comment:        The Xpert SA Assay (FDA approved for NASAL specimens in patients over 62 years of age), is one component of a comprehensive surveillance program.  Test performance has been validated by [facility] for patients greater than or equal to 62 year old. It is not intended to diagnose infection nor to guide or monitor treatment.   Urine culture     Status: None   Collection Time: 08/25/14  4:12 PM  Result Value Ref Range Status   Specimen Description URINE, CLEAN CATCH  Final   Special Requests NONE  Final   Culture NO GROWTH 1 DAY  Final   Report Status 08/26/2014 FINAL  Final     Studies: Ct Head Wo Contrast  08/25/2014   CLINICAL DATA:  [place] out of bed.  Left hip pain.  EXAM: CT HEAD WITHOUT CONTRAST  TECHNIQUE: Contiguous axial images were obtained from the base of the skull through the vertex without intravenous contrast.   COMPARISON:  02/01/2014  FINDINGS: Remote right posterior MCA territory infarct is identified with encephalomalacia involving the posterior right parietal lobe. This appears similar to previous exam. Mild low attenuation within the periventricular and subcortical white matter noted compatible with chronic microvascular disease. Prominence of the sulci and ventricles noted. No acute cortical infarct. No acute intracranial hemorrhage or mass. There is no abnormal extra-axial fluid collections identified. Paranasal sinuses and mastoid air cells are clear. The calvarium is intact.  IMPRESSION: 1. No acute intracranial abnormalities. 2. Remote right MCA territory infarct. 3. Small vessel ischemic disease and brain atrophy.   Electronically Signed   By: Kerby Moors M.D.   On: 08/25/2014 14:36   Chest  Portable 1 View  08/25/2014   CLINICAL DATA:  Shortness of Breath  EXAM: PORTABLE CHEST - 1 VIEW  COMPARISON:  08/22/2014  FINDINGS: Cardiac shadow is enlarged. Pacing device is again seen and stable. A right-sided PICC line is again noted in the mid superior vena cava. The lungs are well aerated bilaterally without focal infiltrate or sizable effusion. No acute bony abnormality is seen.  IMPRESSION: No acute abnormality noted.  No change from the prior exam.   Electronically Signed   By: Inez Catalina M.D.   On: 08/25/2014 19:42   Dg Hip Unilat With Pelvis 2-3 Views Left  08/25/2014   CLINICAL DATA:  Status post fall.  Left hip pain.  EXAM: DG HIP (WITH OR WITHOUT PELVIS) 2-3V LEFT  COMPARISON:  None.  FINDINGS: There is generalized osteopenia. There is a nondisplaced femoral neck fracture which extends into the intertrochanteric region. There is no dislocation. There is no other fracture. 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 There are degenerative changes of bilateral sacroiliac joints.  IMPRESSION: Nondisplaced left femoral neck fracture which extends into the intertrochanteric region.    Electronically Signed   By: Kathreen Devoid   On: 08/25/2014 14:17    Scheduled Meds: . [MAR Hold] ALPRAZolam  0.5 mg Oral QHS  . [MAR Hold] amiodarone  200 mg Oral Daily  . [MAR Hold] bisacodyl  5 mg Oral QHS  .  ceFAZolin (ANCEF) IV  2 g Intravenous To OR  . [MAR Hold] digoxin  0.0625 mg Oral Daily  . [MAR Hold] DULoxetine  60 mg Oral BID  . [MAR Hold] hydrALAZINE  100 mg Oral TID  . [MAR Hold] insulin aspart  0-9 Units Subcutaneous Q4H  . [MAR Hold] insulin glargine  15 Units Subcutaneous BID  . [MAR Hold] levothyroxine  75 mcg Oral QAC breakfast  . [MAR Hold] potassium chloride SA  20 mEq Oral BID  . [MAR Hold] sildenafil  80 mg Oral TID  . [MAR Hold] torsemide  60 mg Oral BID   Continuous Infusions: . 0.45 % NaCl with KCl 20 mEq / L Stopped (08/26/14 1424)  . sodium chloride 10 mL/hr at 08/26/14 1423  . milrinone 0.5 mcg/kg/min (08/26/14 0943)    Principal Problem:   Fracture, intertrochanteric, left femur Active Problems:   Essential hypertension, benign   Persistent atrial fibrillation   Automatic implantable cardioverter-defibrillator in situ   DM2 (diabetes mellitus, type 2)   OSA (obstructive sleep apnea), intolerant to CPAP/BIPAP   Ventricular tachycardia   Chronic systolic CHF (congestive heart failure)   Hip fracture   Left femoral shaft fracture    Time spent: 30 minutes   Barton Dubois  [facility] Pager [phone]. If 7PM-7AM, please contact night-coverage at [email or web address], password [facility] 08/26/2014, 2:24 PM  LOS: 1 day

## 2014-08-26 NOTE — Progress Notes (Signed)
I have spoken with Cards and hospitalist and all agree that he is at high risk for surgery. I  Have spoken with the patient and his wife about this. They understand that surgery is high risk for him but in light of the benefits of stabilizing his hip fracture they would like to go forward with surgery today.    Wyn Nettle D

## 2014-08-26 NOTE — Anesthesia Preprocedure Evaluation (Addendum)
Anesthesia Evaluation  Patient identified by MRN, date of birth, ID band Patient awake    Reviewed: Allergy & Precautions, NPO status , Patient's Chart, lab work & pertinent test results  History of Anesthesia Complications (+) DIFFICULT IV STICK / SPECIAL LINE  Airway Mallampati: II  TM Distance: >3 FB Neck ROM: Full    Dental no notable dental hx.    Pulmonary sleep apnea , former smoker,  pulm htn breath sounds clear to auscultation  Pulmonary exam normal       Cardiovascular hypertension, + CAD, + Peripheral Vascular Disease and +CHF + Cardiac Defibrillator Rhythm:Regular Rate:Normal + Systolic murmurs    Neuro/Psych CVA negative psych ROS   GI/Hepatic negative GI ROS, Neg liver ROS,   Endo/Other  negative endocrine ROSdiabetesHypothyroidism   Renal/GU Renal InsufficiencyRenal diseasenegative Renal ROS  negative genitourinary   Musculoskeletal negative musculoskeletal ROS (+) Arthritis -,   Abdominal   Peds negative pediatric ROS (+)  Hematology negative hematology ROS (+)   Anesthesia Other Findings   Reproductive/Obstetrics negative OB ROS                            Anesthesia Physical Anesthesia Plan  ASA: IV  Anesthesia Plan: General   Post-op Pain Management:    Induction: Intravenous  Airway Management Planned: Oral ETT  Additional Equipment: Arterial line  Intra-op Plan:   Post-operative Plan: Extubation in OR and Possible Post-op intubation/ventilation  Informed Consent: I have reviewed the patients History and Physical, chart, labs and discussed the procedure including the risks, benefits and alternatives for the proposed anesthesia with the patient or authorized representative who has indicated his/her understanding and acceptance.   Dental advisory given  Plan Discussed with: CRNA and Surgeon  Anesthesia Plan Comments:        Anesthesia Quick  Evaluation

## 2014-08-26 NOTE — Interval H&P Note (Signed)
History and Physical Interval Note:  08/26/2014 7:45 AM  Larry [name]  has presented today for surgery, with the diagnosis of left femur fracture  The various methods of treatment have been discussed with the patient and family. After consideration of risks, benefits and other options for treatment, the patient has consented to  Procedure(s): INTRAMEDULLARY (IM) NAIL FEMORAL (Left) as a surgical intervention .  The patient's history has been reviewed, patient examined, no change in status, stable for surgery.  I have reviewed the patient's chart and labs.  Questions were answered to the patient's satisfaction.     Larry [name] D

## 2014-08-26 NOTE — Anesthesia Postprocedure Evaluation (Signed)
  Anesthesia Post-op Note  Patient: Larry [name]  Procedure(s) Performed: Procedure(s): INTRAMEDULLARY (IM) NAIL FEMORAL (Left)  Patient Location: PACU  Anesthesia Type:General  Level of Consciousness: awake  Airway and Oxygen Therapy: Patient Spontanous Breathing  Post-op Pain: none  Post-op Assessment: Post-op Vital signs reviewed, Patient's Cardiovascular Status Stable, Respiratory Function Stable, Patent Airway, No signs of Nausea or vomiting and Pain level controlled              Post-op Vital Signs: Reviewed and stable  Last Vitals:  Filed Vitals:   08/26/14 1735  BP:   Pulse: 78  Temp: 36.2 C  Resp: 21    Complications: No apparent anesthesia complications

## 2014-08-27 ENCOUNTER — Encounter (HOSPITAL_COMMUNITY): Payer: Self-pay | Admitting: Orthopedic Surgery

## 2014-08-27 DIAGNOSIS — D62 Acute posthemorrhagic anemia: Secondary | ICD-10-CM

## 2014-08-27 LAB — CBC
HCT: 29.7 % — ABNORMAL LOW (ref 39.0–52.0)
HEMOGLOBIN: 9.9 g/dL — AB (ref 13.0–17.0)
MCH: 30.9 pg (ref 26.0–34.0)
MCHC: 33.3 g/dL (ref 30.0–36.0)
MCV: 92.8 fL (ref 78.0–100.0)
PLATELETS: 184 10*3/uL (ref 150–400)
RBC: 3.2 MIL/uL — ABNORMAL LOW (ref 4.22–5.81)
RDW: 15.9 % — AB (ref 11.5–15.5)
WBC: 6.9 10*3/uL (ref 4.0–10.5)

## 2014-08-27 LAB — BASIC METABOLIC PANEL
Anion gap: 9 (ref 5–15)
BUN: 51 mg/dL — ABNORMAL HIGH (ref 6–20)
CALCIUM: 8.3 mg/dL — AB (ref 8.9–10.3)
CO2: 23 mmol/L (ref 22–32)
Chloride: 95 mmol/L — ABNORMAL LOW (ref 101–111)
Creatinine, Ser: 2.39 mg/dL — ABNORMAL HIGH (ref 0.61–1.24)
GFR calc non Af Amer: 27 mL/min — ABNORMAL LOW (ref >60)
GFR, EST AFRICAN AMERICAN: 32 mL/min — AB (ref >60)
Glucose, Bld: 211 mg/dL — ABNORMAL HIGH (ref 65–99)
POTASSIUM: 4.7 mmol/L (ref 3.5–5.1)
SODIUM: 127 mmol/L — AB (ref 135–145)

## 2014-08-27 LAB — GLUCOSE, CAPILLARY
GLUCOSE-CAPILLARY: 176 mg/dL — AB (ref 65–99)
GLUCOSE-CAPILLARY: 170 mg/dL — AB (ref 65–99)
Glucose-Capillary: 155 mg/dL — ABNORMAL HIGH (ref 65–99)
Glucose-Capillary: 152 mg/dL — ABNORMAL HIGH (ref 65–99)
Glucose-Capillary: 189 mg/dL — ABNORMAL HIGH (ref 65–99)

## 2014-08-27 MED ORDER — ATORVASTATIN CALCIUM 40 MG PO TABS
40.0000 mg | ORAL_TABLET | Freq: Every day | ORAL | Status: DC
  Administered 2014-08-27 – 2014-10-10 (×41): 40 mg via ORAL
  Filled 2014-08-27 (×51): qty 1

## 2014-08-27 MED ORDER — INSULIN ASPART 100 UNIT/ML ~~LOC~~ SOLN
0.0000 [IU] | Freq: Three times a day (TID) | SUBCUTANEOUS | Status: DC
  Administered 2014-08-27: 2 [IU] via SUBCUTANEOUS

## 2014-08-27 MED ORDER — INSULIN ASPART 100 UNIT/ML ~~LOC~~ SOLN
0.0000 [IU] | Freq: Three times a day (TID) | SUBCUTANEOUS | Status: DC
  Administered 2014-08-27 – 2014-08-28 (×3): 2 [IU] via SUBCUTANEOUS
  Administered 2014-08-28: 3 [IU] via SUBCUTANEOUS
  Administered 2014-08-28 – 2014-08-31 (×8): 2 [IU] via SUBCUTANEOUS
  Administered 2014-09-01: 1 [IU] via SUBCUTANEOUS

## 2014-08-27 MED ORDER — CETYLPYRIDINIUM CHLORIDE 0.05 % MT LIQD
7.0000 mL | Freq: Two times a day (BID) | OROMUCOSAL | Status: DC
  Administered 2014-08-27 – 2014-09-01 (×11): 7 mL via OROMUCOSAL

## 2014-08-27 MED ORDER — FUROSEMIDE 10 MG/ML IJ SOLN
80.0000 mg | Freq: Two times a day (BID) | INTRAMUSCULAR | Status: DC
  Administered 2014-08-27 – 2014-08-28 (×3): 80 mg via INTRAVENOUS
  Filled 2014-08-27 (×4): qty 8

## 2014-08-27 MED ORDER — RIVAROXABAN 15 MG PO TABS
15.0000 mg | ORAL_TABLET | Freq: Every day | ORAL | Status: DC
  Administered 2014-08-27 – 2014-08-29 (×3): 15 mg via ORAL
  Filled 2014-08-27 (×5): qty 1

## 2014-08-27 MED ORDER — OXYCODONE-ACETAMINOPHEN 5-325 MG PO TABS
1.0000 | ORAL_TABLET | ORAL | Status: DC | PRN
  Administered 2014-08-27 – 2014-10-11 (×144): 1 via ORAL
  Filled 2014-08-27 (×142): qty 1

## 2014-08-27 MED ORDER — OXYCODONE HCL 5 MG PO TABS
5.0000 mg | ORAL_TABLET | ORAL | Status: DC | PRN
  Administered 2014-08-27 – 2014-10-11 (×146): 5 mg via ORAL
  Filled 2014-08-27 (×144): qty 1

## 2014-08-27 NOTE — Progress Notes (Signed)
RT entered room to place patient on CPAP and per patients wife patient has recently had dental surgery and still has stitches in place. Due to pressure and tightness of mask CPAP will not be placed on patient at this time. Will continue to monitor.

## 2014-08-27 NOTE — Evaluation (Signed)
Occupational Therapy Evaluation Patient Details Name: Larry [name] MRN: [id number] DOB: January 16, 1951 Today's Date: 08/27/2014    History of Present Illness Larry [name] is a 64 y.o. male who complains of left hip pain after sustaining a mechanical fall at home. Pt with left intertrochanteric fx s/p IM nail. PMHx NICM, CAD, DDD, back pain, CVA, obesity   Clinical Impression   Pt with 10/10 back and L hip pain limiting movement and ability to participate in evaluation.  Demonstrating impaired cognition and requiring +2 assist for all mobility. Pt was independent prior to admission, but ambulated short distances and had occasional help for shoes and socks when his back was especially painful.  Will follow acutely.  Will likely need ST rehab in SNF.    Follow Up Recommendations  SNF;Supervision/Assistance - 24 hour    Equipment Recommendations  3 in 1 bedside comode;Hospital bed    Recommendations for Other Services       Precautions / Restrictions Precautions Precautions: Fall Restrictions Weight Bearing Restrictions: Yes LLE Weight Bearing: Weight bearing as tolerated      Mobility Bed Mobility Overal bed mobility: Needs Assistance Bed Mobility: Sit to Supine       Sit to supine: Max assist;+2 for physical assistance   General bed mobility comments: cues for sequence and to assist LEs  Transfers Overall transfer level: Needs assistance Equipment used: Rolling walker (2 wheeled) Transfers: Sit to/from Stand Sit to Stand: +2 physical assistance;Min assist              Balance     Sitting balance-Leahy Scale: Fair       Standing balance-Leahy Scale: Poor                              ADL Overall ADL's : Needs assistance/impaired Eating/Feeding: Independent;Sitting   Grooming: Wash/dry hands;Wash/dry face;Set up;Sitting   Upper Body Bathing: Minimal assitance;Sitting   Lower Body Bathing: Total assistance;Sit to/from stand;+2 for  physical assistance   Upper Body Dressing : Minimal assistance;Sitting   Lower Body Dressing: Total assistance;Sit to/from stand;+2 for physical assistance   Toilet Transfer: +2 for physical assistance;Minimal assistance;Ambulation;RW           Functional mobility during ADLs: +2 for physical assistance;Minimal assistance;Rolling walker       Vision     Perception     Praxis      Pertinent Vitals/Pain Pain Assessment: 0-10 Pain Score: 10-Worst pain ever Pain Location: back, L hip Pain Descriptors / Indicators: Aching;Operative site guarding;Grimacing;Guarding Pain Intervention(s): Limited activity within patient's tolerance;Monitored during session;Repositioned;Patient requesting pain meds-RN notified;RN gave pain meds during session     Hand Dominance Right   Extremity/Trunk Assessment Upper Extremity Assessment Upper Extremity Assessment: Overall WFL for tasks assessed   Lower Extremity Assessment Lower Extremity Assessment: Defer to PT evaluation   Cervical / Trunk Assessment Cervical / Trunk Assessment: Normal   Communication Communication Communication: No difficulties   Cognition Arousal/Alertness: Lethargic Behavior During Therapy: Restless Overall Cognitive Status: Impaired/Different from baseline Area of Impairment: Orientation;Memory;Attention;Following commands;Safety/judgement;Problem solving;Awareness Orientation Level: Disoriented to;Time;Situation Current Attention Level: Focused Memory: Decreased recall of precautions;Decreased short-term memory Following Commands: Follows one step commands inconsistently Safety/Judgement: Decreased awareness of safety;Decreased awareness of deficits   Problem Solving: Slow processing;Decreased initiation;Difficulty sequencing;Requires verbal cues;Requires tactile cues General Comments: pain interfering   General Comments       Exercises       Shoulder Instructions  Home Living Family/patient  expects to be discharged to:: Private residence Living Arrangements: Spouse/significant other Available Help at Discharge: Family;Available 24 hours/day Type of Home: House Home Access: Stairs to enter [employer] of Steps: 2 Entrance Stairs-Rails: None Home Layout: Two level;Bed/bath upstairs;1/2 bath on main level Alternate Level Stairs-Number of Steps: 14 Alternate Level Stairs-Rails: Right Bathroom Shower/Tub: Walk-in shower (on second floor)   Bathroom Toilet: Standard     Home Equipment: None          Prior Functioning/Environment Level of Independence: Independent        Comments: pt normally with limited gait distance secondary to back pain, assisted sometimes with LB dressing due to back pain    OT Diagnosis: Generalized weakness;Cognitive deficits;Acute pain   OT Problem List: Decreased strength;Decreased activity tolerance;Impaired balance (sitting and/or standing);Decreased cognition;Decreased knowledge of precautions;Decreased knowledge of use of DME or AE;Pain   OT Treatment/Interventions: Self-care/ADL training;DME and/or AE instruction;Therapeutic activities;Cognitive remediation/compensation;Patient/family education;Balance training    OT Goals(Current goals can be found in the care plan section) Acute Rehab OT Goals Patient Stated Goal: return home able to care for himself OT Goal Formulation: With patient Time For Goal Achievement: 09/10/14 Potential to Achieve Goals: Good ADL Goals Pt Will Perform Grooming: with supervision;standing Pt Will Perform Lower Body Bathing: with supervision;sit to/from stand;with adaptive equipment Pt Will Perform Lower Body Dressing: with supervision;sit to/from stand;with adaptive equipment Pt Will Transfer to Toilet: with supervision;ambulating;bedside commode (over toilet) Pt Will Perform Toileting - Clothing Manipulation and hygiene: with supervision;sit to/from stand Additional ADL Goal #1: Pt will perform  bed mobility with supervision.  OT Frequency: Min 2X/week   Barriers to D/C: Inaccessible home environment          Co-evaluation              End of Session Equipment Utilized During Treatment: Surveyor, mining Communication: Patient requests pain meds  Activity Tolerance: Patient limited by lethargy;Patient limited by pain Patient left: in bed;with call bell/phone within reach;with family/visitor present   Time: 6568-1275 OT Time Calculation (min): 38 min Charges:  OT General Charges $OT Visit: 1 Procedure OT Evaluation $Initial OT Evaluation Tier I: 1 Procedure OT Treatments $Therapeutic Activity: 8-22 mins G-Codes:    [name] 08/27/2014, 4:35 PM [phone]

## 2014-08-27 NOTE — Progress Notes (Signed)
Advanced Heart Failure Rounding Note   Subjective:    Larry [name] is a 64 y/o man with history of chronic systolic CHF (nonischemic cardiomyopathy) s/p 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 He is on chronic milrinone 0.5 mcg.  Prior to admit he was being considered for LVAD.   Just dischaged 7/16 after dental procedures. Also had RHC with elevate pulmonary pressures. Revatio was increased to 80 mg tid.   Admitted with fall---> broken L  hip . 7/18 had IM Nail L femur.   Over night had difficulty with agitation and pain. Denies SOB. Weight up 7 pounds.     Objective:   Weight Range:  Vital Signs:   Temp:  [97.2 F (36.2 C)-99.2 F (37.3 C)] 98.8 F (37.1 C) (07/19 0421) Pulse Rate:  [76-84] 84 (07/19 0421) Resp:  [16-29] 20 (07/19 0140) BP: (116-134)/(53-64) 134/64 mmHg (07/19 0421) SpO2:  [88 %-98 %] 94 % (07/19 0421) Arterial Line BP: (147-174)/(49-69) 162/57 mmHg (07/18 1754) Weight:  [230 lb 8 oz (104.554 kg)] 230 lb 8 oz (104.554 kg) (07/19 0430) Last BM Date: 08/23/14  Weight change: Filed Weights   08/25/14 1838 08/26/14 0520 08/27/14 0430  Weight: 223 lb 12.3 oz (101.5 kg) 225 lb 12.8 oz (102.422 kg) 230 lb 8 oz (104.554 kg)    Intake/Output:   Intake/Output Summary (Last 24 hours) at 08/27/14 1001 Last data filed at 08/27/14 0855  Gross per 24 hour  Intake 2236.39 ml  Output    775 ml  Net 1461.39 ml     Physical Exam: General:  Sitting in chair. Drowsy. Wife at bed side.  HEENT: normal Neck: supple. JVP ~10  . Carotids 2+ bilat; no bruits. No lymphadenopathy or thryomegaly appreciated. Cor: PMI nondisplaced. Regular rate & rhythm. No rubs, gallops or murmurs. Lungs: clear Abdomen: soft, nontender, nondistended. No hepatosplenomegaly. No bruits or masses. Good bowel sounds. Extremities: no cyanosis, clubbing, rash, edema. RUE PICC dual lumen . R Thigh lateral aspect  dressing intact x2.  Neuro: Pleasantly confused. Cranial nerves grossly intact. moves all 4 extremities w/o difficulty. Affect pleasant  Telemetry: NSR PVCs. Long PR   Labs: Basic Metabolic Panel:  Recent Labs Lab 08/23/14 0420  08/24/14 0530 08/24/14 1000 08/25/14 1458 08/26/14 0605 08/27/14 0506  NA  --   < > 131* 130* 127* 129* 127*  K  --   < > 4.7 4.6 4.7 4.6 4.7  CL  --   < > 98* 95* 93* 95* 95*  CO2  --   < > 26 27 23 25 23   GLUCOSE  --   < > 232* 280* 233* 172* 211*  BUN  --   < > 31* 31* 44* 47* 51*  CREATININE  --   < > 1.87* 1.81* 2.46* 2.29* 2.39*  CALCIUM  --   < > 8.9 8.8* 8.9 8.6* 8.3*  MG 2.3  --   --   --   --  2.1  --   < > = values in this interval not displayed.  Liver Function Tests: No results for input(s): AST, ALT, ALKPHOS, BILITOT, PROT, ALBUMIN in the last 168 hours. No results for input(s): LIPASE, AMYLASE in the last 168 hours. No results for input(s): AMMONIA in the last 168 hours.  CBC:  Recent Labs Lab 08/22/14 0506 08/23/14 0500 08/24/14 0530 08/25/14 1458 08/26/14 0605 08/27/14 0805  WBC 5.1 7.2 9.3 8.5 7.5  6.9  NEUTROABS 3.4 5.2 7.2 6.8  --   --   HGB 9.7* 10.5* 10.8* 9.9* 8.8* 9.9*  HCT 30.3* 33.0* 33.3* 30.7* 27.5* 29.7*  MCV 99.7 98.5 98.8 95.6 94.8 92.8  PLT 235 257 285 241 209 184    Cardiac Enzymes: No results for input(s): CKTOTAL, CKMB, CKMBINDEX, TROPONINI in the last 168 hours.  BNP: BNP (last 3 results)  Recent Labs  05/22/14 1029 05/27/14 1126 06/19/14 1030  BNP 935.2* 1627.8* 738.9*    ProBNP (last 3 results)  Recent Labs  10/09/13 0926 11/14/13 0926 01/14/14 1112  PROBNP 1561.0* 1261.0* 5769.0*      Other results:  Imaging: Ct Head Wo Contrast  08/25/2014   CLINICAL DATA:  [place] out of bed.  Left hip pain.  EXAM: CT HEAD WITHOUT CONTRAST  TECHNIQUE: Contiguous axial images were obtained from the base of the skull through the vertex without intravenous contrast.  COMPARISON:  02/01/2014   FINDINGS: Remote right posterior MCA territory infarct is identified with encephalomalacia involving the posterior right parietal lobe. This appears similar to previous exam. Mild low attenuation within the periventricular and subcortical white matter noted compatible with chronic microvascular disease. Prominence of the sulci and ventricles noted. No acute cortical infarct. No acute intracranial hemorrhage or mass. There is no abnormal extra-axial fluid collections identified. Paranasal sinuses and mastoid air cells are clear. The calvarium is intact.  IMPRESSION: 1. No acute intracranial abnormalities. 2. Remote right MCA territory infarct. 3. Small vessel ischemic disease and brain atrophy.   Electronically Signed   By: Kerby Moors M.D.   On: 08/25/2014 14:36   Pelvis Portable  08/26/2014   CLINICAL DATA:  LEFT femoral nail surgery, hip fracture  EXAM: PORTABLE PELVIS 1-2 VIEWS  COMPARISON:  Portable exam 2107 hours compared intraoperative images of 08/16/2014  FINDINGS: IM nail with compression screw at proximal LEFT femur post ORIF of a nondisplaced LEFT femoral neck fracture.  Distal extent of hardware not imaged.  Hip joint spaces mildly narrowed bilaterally.  Pelvis intact.  No additional fractures identified.  IMPRESSION: Post ORIF proximal LEFT femur.   Electronically Signed   By: Lavonia Dana M.D.   On: 08/26/2014 21:44   Chest Portable 1 View  08/25/2014   CLINICAL DATA:  Shortness of Breath  EXAM: PORTABLE CHEST - 1 VIEW  COMPARISON:  08/22/2014  FINDINGS: Cardiac shadow is enlarged. Pacing device is again seen and stable. A right-sided PICC line is again noted in the mid superior vena cava. The lungs are well aerated bilaterally without focal infiltrate or sizable effusion. No acute bony abnormality is seen.  IMPRESSION: No acute abnormality noted.  No change from the prior exam.   Electronically Signed   By: Inez Catalina M.D.   On: 08/25/2014 19:42   Dg C-arm 1-60 Min  08/26/2014   CLINICAL  DATA:  ORIF left femur.  EXAM: DG C-ARM 61-120 MIN  COMPARISON:  None.  FINDINGS: ORIF left femur. Good anatomic alignment. 0 minutes 55 seconds fluoroscopic time. Three images.   Electronically Signed   By: Marcello Moores  Register   On: 08/26/2014 17:08   Dg Hip Unilat With Pelvis 2-3 Views Left  08/25/2014   CLINICAL DATA:  Status post fall.  Left hip pain.  EXAM: DG HIP (WITH OR WITHOUT PELVIS) 2-3V LEFT  COMPARISON:  None.  FINDINGS: There is generalized osteopenia. There is a nondisplaced femoral neck fracture which extends into the intertrochanteric region. There is no dislocation. There is no  other fracture. 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 There are degenerative changes of bilateral sacroiliac joints.  IMPRESSION: Nondisplaced left femoral neck fracture which extends into the intertrochanteric region.   Electronically Signed   By: Kathreen Devoid   On: 08/25/2014 14:17   Dg Femur Min 2 Views Left  08/26/2014   CLINICAL DATA:  ORIF left femur.  EXAM: LEFT FEMUR 2 VIEWS  COMPARISON:  None.  FINDINGS: Open reduction internal fixation left femur with good anatomic alignment. Hardware intact. Vascular calcification .  IMPRESSION: ORIF left femur .   Electronically Signed   By: Marcello Moores  Register   On: 08/26/2014 17:07     Medications:     Scheduled Medications: . sodium chloride   Intravenous Once  . sodium chloride  10 mL/hr Intravenous Once  . ALPRAZolam  0.5 mg Oral QHS  . amiodarone  200 mg Oral Daily  . bisacodyl  5 mg Oral QHS  . digoxin  0.0625 mg Oral Daily  . DULoxetine  60 mg Oral BID  . furosemide  80 mg Intravenous BID  . hydrALAZINE  100 mg Oral TID  . insulin aspart  0-9 Units Subcutaneous Q4H  . insulin glargine  15 Units Subcutaneous BID  . levothyroxine  75 mcg Oral QAC breakfast  . milrinone  0.375 mcg/kg/min Intravenous To OR  . potassium chloride SA  20 mEq Oral BID  . sildenafil  80 mg Oral TID    Infusions: . sodium chloride Stopped (08/26/14 1645)  .  milrinone 0.5 mcg/kg/min (08/27/14 0851)    PRN Medications: albuterol, ALPRAZolam **AND** ALPRAZolam, HYDROcodone-acetaminophen, morphine injection, ondansetron (ZOFRAN) IV, oxyCODONE-acetaminophen, polyethylene glycol, sodium chloride, zolpidem   Assessment:  1. Left Hip Fracture  2. Chronic systolic CHF- on home milrinone 0.22mcg 3. CKD stage III-  4.  Paroxysmal atrial fibrillation requiring DCCV 02/2014, 04/2014- on chronic amio and xarelto. CHADS VASc Score- 5 5. Diabetes mellitus type 2 6. Depression/anxiety 7. OSA, complex- Needs Bipap at night but had difficulty after dental procedure.  8 Pulmonary HTN - Mixed pulmonary venous hypertension and PAH- on  9. Dental extractions 08/2014 by Dr Enrique Sack 10. Obesity Body mass index is 32.39 kg/(m^2). 11. Probable chronic respiratory failure - requiring home O2 with ambulation as of this admission 12. Hyponatremia - NA 129    Plan/Discussion:     Post op Day # 1--> IM Nail L femur. Ok to restart Xarelto per ortho.  Stable. Agitated over night. Adjust pain meds to home regimen.   Continue milrinone 0.5 mcg, dig 0.0625 mg. Weight up 7 pounds. Give 80 mg IV lasix twice daily. Stop torsemide.   Continue revatio 80 mg tid. Needs to try and use Bipap tonight. Hopefully pain from dental extractions wont be an issue.   Sinus Rhythm with PVCs. Long PR interval noted on EKG . Continue amio. Stop metoprolol .   Length of Stay: 2   CLEGG,AMY NP-C  08/27/2014, 10:01 AM  Advanced Heart Failure Team Pager [phone] (M-F; [place])  Please contact [facility] for night-coverage after hours (4p -7a ) and weekends on [email or web address]  Patient seen and examined with Darrick Grinder, NP. We discussed all aspects of the encounter. I agree with the assessment and plan as stated above.   He has gotten through surgery well. Volume status up slightly. Will give 1 day of IV lasix. Continue milrinone. Watch for urinary retention with pain meds. Will perform bladder  scan. Xarelto being restarted.   Bensimhon, Daniel,MD  6:19 PM

## 2014-08-27 NOTE — Progress Notes (Signed)
Patient has been up agitated from beginning through to middle of shift. Xanax, morphine, Vicodin, and ambien given per PRN order but patient continue to not be comfortable. Patient still complains of pain after pain med is given. Suggested K-Pad to help patient's back for he states it hurts due to fall and patient agreed. Patient is still mildly to moderate confused. Notified hospitalist of request for K-pad. Wife is at bedside Will continue to monitor patient to end of shift.

## 2014-08-27 NOTE — Progress Notes (Signed)
[facility]  Patient Status:  Active pt with [facility] prior to this admission  [facility] is providing the following services: [facility] and [facility] for home Milrinone.  [facility] hospital team will follow pt and support transition home when ordered.    If patient discharges after hours, please call [phone].   Larry Sierras 08/27/2014, 12:47 PM

## 2014-08-27 NOTE — Progress Notes (Signed)
     Subjective:  POD#1 IM Nail of L Femur. Patient reports pain as moderate.  Resting comfortably in bed.  Very agitated overnight.  The wife is now at the bedside.    Objective:   VITALS:   Filed Vitals:   08/27/14 0105 08/27/14 0140 08/27/14 0421 08/27/14 0430  BP: 127/58 124/53 134/64   Pulse: 84 84 84   Temp: 99.2 F (37.3 C) 98.7 F (37.1 C) 98.8 F (37.1 C)   TempSrc: Oral Oral Oral   Resp: 24 20    Height:      Weight:    104.554 kg (230 lb 8 oz)  SpO2: 93% 95% 94%     Neurologically intact ABD soft Neurovascular intact Sensation intact distally Intact pulses distally Dorsiflexion/Plantar flexion intact Incision: scant drainage   Lab Results  Component Value Date   WBC 6.9 08/27/2014   HGB 9.9* 08/27/2014   HCT 29.7* 08/27/2014   MCV 92.8 08/27/2014   PLT 184 08/27/2014   BMET    Component Value Date/Time   NA 127* 08/27/2014 0506   K 4.7 08/27/2014 0506   CL 95* 08/27/2014 0506   CO2 23 08/27/2014 0506   GLUCOSE 211* 08/27/2014 0506   BUN 51* 08/27/2014 0506   CREATININE 2.39* 08/27/2014 0506   CALCIUM 8.3* 08/27/2014 0506   GFRNONAA 27* 08/27/2014 0506   GFRAA 32* 08/27/2014 0506     Assessment/Plan: 1 Day Post-Op   Principal Problem:   Fracture, intertrochanteric, left femur Active Problems:   Essential hypertension, benign   Persistent atrial fibrillation   Automatic implantable cardioverter-defibrillator in situ   DM2 (diabetes mellitus, type 2)   OSA (obstructive sleep apnea), intolerant to CPAP/BIPAP   Ventricular tachycardia   Chronic systolic CHF (congestive heart failure)   Hip fracture   Left femoral shaft fracture   Up with therapy WBAT in the LLE Xarelto for DVT prophylaxis   Larry [name] [name] 08/27/2014, 9:40 AM Cell [phone]

## 2014-08-27 NOTE — Progress Notes (Signed)
[facility] PROGRESS NOTE  TAEVION SIKORA ZMO:[id number] DOB: 03-26-1950 DOA: 08/25/2014 PCP: [name], MD  Assessment/Plan: 1.Mechanical trip and fall with left femoral intertrochanteric fracture.  -status post intramedullary nail to left hip on 7/19 -high risk for perioperative cardiopulmonary complication -continue IS and flutter valve -will follow ortho post operative rec's -ok to resume xarelto -WBAT -PT/OT evaluation  2. Severe nonischemic cardiomyopathy with chronic systolic heart failure with an EF of 25%. History of V. tach. Patient has AICD, on home milrinone drip, currently appears compensated, home medications which include beta blocker, hydralazine, milrinone drip, digoxin and amiodarone will all be continued; except for metoprolol on hold as per rec's from Heart failure team -He is not on ACE/ARB due to renal failure -cardiology (heart failure) on board, will follow further rec's  3. Paroxysmal atrial fibrillation. Xarelto on hold since 08/23/2014, continue beta blocker, amiodarone and digoxin.  -Cardiology on board will follow rec's -continue telemetry monitoring.   4. Recent teeth extraction 2 days prior to admission.  -ok to resume xarelto from ortho stand point  5.ARF on chronic kidney disease stage IV. Baseline creatinine appears to be 1.9, patient actually at this time appears to be dehydrated I think due to his recent dental surgery 2 days prior to [place].  -will hold torsemide; plan is to use lasix -heart failure team on board, will follow rec's -Avoid ACE/ARB.  -no IVF's -daily weight and strict I's and O's -Cr (7/19) 2.39 will follow renal function trend   6. Hyponatremia.  -slightly better -patient now with 8 pounds up; most likely with some hypervolemia -following cardiology rec's will start lasix   7. Obstructive sleep apnea. Currently not on C Pap, apparently he was supposed to be started on BiPAP for better tolerance as he is  claustrophobic in the near future. -will continue oxygen [place] during the day -will order BIPAP nasal prone QHS while inpatient, starting today 7/19  8. DM type II. recent A1c of 7.7 -continue Lantus and SSI -Monitor CBGs closely.   9.Hypothyroidism and pituitary microadenoma. No acute issues, continue home dose Synthroid.  10-anemia of chronic disease with acute blood loss component -received 2 units of PRBC's on 7/18 -will follow Hgb trend  Code Status: Full Family Communication: wife at bedside Disposition Plan: to be determine, remains inpatient    Consultants:  Ortho (Dr. Percell Miller)  HF team   Procedures:  S/P intramedullary nail on left hip on 7/18  Antibiotics:  None   HPI/Subjective: Afebrile, no CP or SOB. Still Complaining of LLE pain, but better after surgery. Had very agitated night. Weight is up 8 pounds  Objective: Filed Vitals:   08/27/14 1623  BP: 121/56  Pulse: 80  Temp: 97.9 F (36.6 C)  Resp: 18    Intake/Output Summary (Last 24 hours) at 08/27/14 1708 Last data filed at 08/27/14 1530  Gross per 24 hour  Intake 1977.99 ml  Output    750 ml  Net 1227.99 ml   Filed Weights   08/25/14 1838 08/26/14 0520 08/27/14 0430  Weight: 101.5 kg (223 lb 12.3 oz) 102.422 kg (225 lb 12.8 oz) 104.554 kg (230 lb 8 oz)    Exam:   General:  Afebrile, slightly confused from pain meds, still complaining of pain on his LLE, but much better. Patient with very agitated night.   Cardiovascular:norubs, no gallops, RRR  Respiratory: no frank crackles, no wheezing   Abdomen: soft, NT, ND, positive BS  Musculoskeletal: no edema, no cyanosis, LLE with pain (  worse with movement)  Data Reviewed: Basic Metabolic Panel:  Recent Labs Lab 08/23/14 0420  08/24/14 0530 08/24/14 1000 08/25/14 1458 08/26/14 0605 08/27/14 0506  NA  --   < > 131* 130* 127* 129* 127*  K  --   < > 4.7 4.6 4.7 4.6 4.7  CL  --   < > 98* 95* 93* 95* 95*  CO2  --   < > 26 27 23 25 23    GLUCOSE  --   < > 232* 280* 233* 172* 211*  BUN  --   < > 31* 31* 44* 47* 51*  CREATININE  --   < > 1.87* 1.81* 2.46* 2.29* 2.39*  CALCIUM  --   < > 8.9 8.8* 8.9 8.6* 8.3*  MG 2.3  --   --   --   --  2.1  --   < > = values in this interval not displayed. CBC:  Recent Labs Lab 08/22/14 0506 08/23/14 0500 08/24/14 0530 08/25/14 1458 08/26/14 0605 08/27/14 0805  WBC 5.1 7.2 9.3 8.5 7.5 6.9  NEUTROABS 3.4 5.2 7.2 6.8  --   --   HGB 9.7* 10.5* 10.8* 9.9* 8.8* 9.9*  HCT 30.3* 33.0* 33.3* 30.7* 27.5* 29.7*  MCV 99.7 98.5 98.8 95.6 94.8 92.8  PLT 235 257 285 241 209 184   BNP (last 3 results)  Recent Labs  05/22/14 1029 05/27/14 1126 06/19/14 1030  BNP 935.2* 1627.8* 738.9*    ProBNP (last 3 results)  Recent Labs  10/09/13 0926 11/14/13 0926 01/14/14 1112  PROBNP 1561.0* 1261.0* 5769.0*   CBG:  Recent Labs Lab 08/26/14 1834 08/26/14 2303 08/27/14 0414 08/27/14 0821 08/27/14 1159  GLUCAP 210* 201* 176* 170* 155*    Recent Results (from the past 240 hour(s))  MRSA PCR Screening     Status: None   Collection Time: 08/21/14  6:50 PM  Result Value Ref Range Status   MRSA by PCR NEGATIVE NEGATIVE Final    Comment:        The GeneXpert MRSA Assay (FDA approved for NASAL specimens only), is one component of a comprehensive MRSA colonization surveillance program. It is not intended to diagnose MRSA infection nor to guide or monitor treatment for MRSA infections.   Surgical pcr screen     Status: None   Collection Time: 08/21/14 10:38 PM  Result Value Ref Range Status   MRSA, PCR NEGATIVE NEGATIVE Final   Staphylococcus aureus NEGATIVE NEGATIVE Final    Comment:        The Xpert SA Assay (FDA approved for NASAL specimens in patients over 2 years of age), is one component of a comprehensive surveillance program.  Test performance has been validated by [facility] for patients greater than or equal to 17 year old. It is not intended to diagnose  infection nor to guide or monitor treatment.   Urine culture     Status: None   Collection Time: 08/25/14  4:12 PM  Result Value Ref Range Status   Specimen Description URINE, CLEAN CATCH  Final   Special Requests NONE  Final   Culture NO GROWTH 1 DAY  Final   Report Status 08/26/2014 FINAL  Final     Studies: Pelvis Portable  08/26/2014   CLINICAL DATA:  LEFT femoral nail surgery, hip fracture  EXAM: PORTABLE PELVIS 1-2 VIEWS  COMPARISON:  Portable exam 2107 hours compared intraoperative images of 08/16/2014  FINDINGS: IM nail with compression screw at proximal LEFT femur post ORIF  of a nondisplaced LEFT femoral neck fracture.  Distal extent of hardware not imaged.  Hip joint spaces mildly narrowed bilaterally.  Pelvis intact.  No additional fractures identified.  IMPRESSION: Post ORIF proximal LEFT femur.   Electronically Signed   By: Lavonia Dana M.D.   On: 08/26/2014 21:44   Chest Portable 1 View  08/25/2014   CLINICAL DATA:  Shortness of Breath  EXAM: PORTABLE CHEST - 1 VIEW  COMPARISON:  08/22/2014  FINDINGS: Cardiac shadow is enlarged. Pacing device is again seen and stable. A right-sided PICC line is again noted in the mid superior vena cava. The lungs are well aerated bilaterally without focal infiltrate or sizable effusion. No acute bony abnormality is seen.  IMPRESSION: No acute abnormality noted.  No change from the prior exam.   Electronically Signed   By: Inez Catalina M.D.   On: 08/25/2014 19:42   Dg C-arm 1-60 Min  08/26/2014   CLINICAL DATA:  ORIF left femur.  EXAM: DG C-ARM 61-120 MIN  COMPARISON:  None.  FINDINGS: ORIF left femur. Good anatomic alignment. 0 minutes 55 seconds fluoroscopic time. Three images.   Electronically Signed   By: Marcello Moores  Register   On: 08/26/2014 17:08   Dg Femur Min 2 Views Left  08/26/2014   CLINICAL DATA:  ORIF left femur.  EXAM: LEFT FEMUR 2 VIEWS  COMPARISON:  None.  FINDINGS: Open reduction internal fixation left femur with good anatomic  alignment. Hardware intact. Vascular calcification .  IMPRESSION: ORIF left femur .   Electronically Signed   By: Marcello Moores  Register   On: 08/26/2014 17:07    Scheduled Meds: . sodium chloride   Intravenous Once  . sodium chloride  10 mL/hr Intravenous Once  . ALPRAZolam  0.5 mg Oral QHS  . amiodarone  200 mg Oral Daily  . atorvastatin  40 mg Oral q1800  . bisacodyl  5 mg Oral QHS  . digoxin  0.0625 mg Oral Daily  . DULoxetine  60 mg Oral BID  . furosemide  80 mg Intravenous BID  . hydrALAZINE  100 mg Oral TID  . insulin aspart  0-9 Units Subcutaneous TID AC & HS  . insulin glargine  15 Units Subcutaneous BID  . levothyroxine  75 mcg Oral QAC breakfast  . potassium chloride SA  20 mEq Oral BID  . rivaroxaban  15 mg Oral Q supper  . sildenafil  80 mg Oral TID   Continuous Infusions: . sodium chloride Stopped (08/26/14 1645)  . milrinone 0.5 mcg/kg/min (08/27/14 0851)    Principal Problem:   Fracture, intertrochanteric, left femur Active Problems:   Essential hypertension, benign   Persistent atrial fibrillation   Automatic implantable cardioverter-defibrillator in situ   DM2 (diabetes mellitus, type 2)   OSA (obstructive sleep apnea), intolerant to CPAP/BIPAP   Ventricular tachycardia   Chronic systolic CHF (congestive heart failure)   Hip fracture   Left femoral shaft fracture    Time spent: 30 minutes   Barton Dubois  [facility] Pager [phone]. If 7PM-7AM, please contact night-coverage at [email or web address], password [facility] 08/27/2014, 5:08 PM  LOS: 2 days

## 2014-08-27 NOTE — Evaluation (Signed)
Physical Therapy Evaluation Patient Details Name: Larry [name] MRN: [id number] DOB: [birth year] Today's Date: 08/27/2014   History of Present Illness  Larry [name] is a 64 y.o. male who complains of left hip pain after sustaining a mechanical fall at home. Pt with left intertrochanteric fx s/p IM nail. PMHx NICM, CAD, DDD, back pain, CVA, obesity  Clinical Impression  Pt lethargic with difficulty maintaining eyes open but also restless wanting to pull clothes off and move. Pt trying to get to side of bed on arrival with assist to mobilize stating need to urinate but unable to void in sitting or standing. Wife present throughout able to provide PLOf and home setup. Wife would prefer to aim for home but aware that pt current state he would require SNF. Will follow acutely to maximize mobility, function, balance, safety and gait to decrease burden of care.     Follow Up Recommendations SNF;Supervision for mobility/OOB    Equipment Recommendations  Rolling walker with 5" wheels;3in1 (PT)    Recommendations for Other Services       Precautions / Restrictions Precautions Precautions: Fall Restrictions LLE Weight Bearing: Weight bearing as tolerated      Mobility  Bed Mobility Overal bed mobility: Needs Assistance Bed Mobility: Supine to Sit     Supine to sit: Min assist     General bed mobility comments: cues for sequence with assist to bring legs off of bed and elevate trunk   Transfers Overall transfer level: Needs assistance   Transfers: Sit to/from Stand Sit to Stand: Min assist         General transfer comment: cues for hand placement, anterior translation and safety   Ambulation/Gait Ambulation/Gait assistance: Min assist;+2 safety/equipment Ambulation Distance (Feet): 5 Feet Assistive device: Rolling walker (2 wheeled) Gait Pattern/deviations: Step-to pattern   Gait velocity interpretation: Below normal speed for age/gender General Gait Details: max  sequential cues for RW use as well as safety  Stairs            Wheelchair Mobility    Modified Rankin (Stroke Patients Only)       Balance Overall balance assessment: Needs assistance   Sitting balance-Leahy Scale: Fair       Standing balance-Leahy Scale: Poor                               Pertinent Vitals/Pain Pain Assessment: No/denies pain  93% on 2.5 L Dropped to 87% on RA and returned to O2. HR 79    Home Living Family/patient expects to be discharged to:: Private residence Living Arrangements: Spouse/significant other Available Help at Discharge: Family;Available 24 hours/day Type of Home: House Home Access: Stairs to enter Entrance Stairs-Rails: None Entrance Stairs-Number of Steps: 2 Home Layout: Two level;Bed/bath upstairs Home Equipment: None      Prior Function Level of Independence: Independent         Comments: pt normally with limited gait distance secondary to back pain     Hand Dominance        Extremity/Trunk Assessment   Upper Extremity Assessment: Overall WFL for tasks assessed           Lower Extremity Assessment: Generalized weakness      Cervical / Trunk Assessment: Normal  Communication   Communication: No difficulties  Cognition Arousal/Alertness: Lethargic Behavior During Therapy: Restless Overall Cognitive Status: Impaired/Different from baseline Area of Impairment: Orientation;Memory;Attention;Following commands;Safety/judgement;Problem solving;Awareness Orientation Level: Disoriented to;Time;Situation Current Attention Level:  Focused Memory: Decreased recall of precautions;Decreased short-term memory Following Commands: Follows one step commands inconsistently Safety/Judgement: Decreased awareness of safety;Decreased awareness of deficits   Problem Solving: Slow processing;Decreased initiation;Difficulty sequencing;Requires verbal cues;Requires tactile cues      General Comments       Exercises        Assessment/Plan    PT Assessment Patient needs continued PT services  PT Diagnosis Difficulty walking;Altered mental status;Generalized weakness   PT Problem List Decreased strength;Decreased cognition;Decreased activity tolerance;Decreased balance;Decreased mobility;Cardiopulmonary status limiting activity;Decreased knowledge of use of DME;Decreased safety awareness  PT Treatment Interventions DME instruction;Gait training;Functional mobility training;Therapeutic activities;Therapeutic exercise;Stair training;Cognitive remediation;Patient/family education   PT Goals (Current goals can be found in the Care Plan section) Acute Rehab PT Goals Patient Stated Goal: return home able to care for himself PT Goal Formulation: With family Time For Goal Achievement: 09/10/14 Potential to Achieve Goals: Fair    Frequency Min 3X/week   Barriers to discharge Decreased caregiver support      Co-evaluation               End of Session Equipment Utilized During Treatment: Gait belt;Oxygen Activity Tolerance: Patient limited by lethargy Patient left: in chair;with call bell/phone within reach;with chair alarm set;with family/visitor present Nurse Communication: Mobility status;Precautions;Weight bearing status         Time: 0731-0801 PT Time Calculation (min) (ACUTE ONLY): 30 min   Charges:   PT Evaluation $Initial PT Evaluation Tier I: 1 Procedure PT Treatments $Gait Training: 8-22 mins   PT G CodesMelford Aase 08/27/2014, 10:06 AM [name], [place]

## 2014-08-28 DIAGNOSIS — E1121 Type 2 diabetes mellitus with diabetic nephropathy: Secondary | ICD-10-CM

## 2014-08-28 LAB — TYPE AND SCREEN
ABO/RH(D): A POS
ANTIBODY SCREEN: NEGATIVE
UNIT DIVISION: 0
Unit division: 0

## 2014-08-28 LAB — BASIC METABOLIC PANEL
Anion gap: 11 (ref 5–15)
BUN: 64 mg/dL — ABNORMAL HIGH (ref 6–20)
CHLORIDE: 95 mmol/L — AB (ref 101–111)
CO2: 23 mmol/L (ref 22–32)
CREATININE: 2.67 mg/dL — AB (ref 0.61–1.24)
Calcium: 8.6 mg/dL — ABNORMAL LOW (ref 8.9–10.3)
GFR calc Af Amer: 28 mL/min — ABNORMAL LOW (ref >60)
GFR, EST NON AFRICAN AMERICAN: 24 mL/min — AB (ref >60)
GLUCOSE: 159 mg/dL — AB (ref 65–99)
Potassium: 5.1 mmol/L (ref 3.5–5.1)
Sodium: 129 mmol/L — ABNORMAL LOW (ref 135–145)

## 2014-08-28 LAB — GLUCOSE, CAPILLARY
Glucose-Capillary: 160 mg/dL — ABNORMAL HIGH (ref 65–99)
Glucose-Capillary: 202 mg/dL — ABNORMAL HIGH (ref 65–99)
Glucose-Capillary: 193 mg/dL — ABNORMAL HIGH (ref 65–99)

## 2014-08-28 NOTE — Progress Notes (Signed)
     Subjective:  POD#2 IM nail of L femur. Patient reports pain as moderate.  Pain was worse overnight.  The patient was able to get up with PT yesterday.    Objective:   VITALS:   Filed Vitals:   08/27/14 1623 08/27/14 2051 08/28/14 0138 08/28/14 0613  BP: 121/56 135/59 130/64 117/47  Pulse: 80 90 91 88  Temp: 97.9 F (36.6 C) 98.6 F (37 C) 98.2 F (36.8 C) 98 F (36.7 C)  TempSrc: Oral Oral Oral Oral  Resp: 18 18 18 18   Height:      Weight:    105.098 kg (231 lb 11.2 oz)  SpO2: 94% 92% 93% 93%    Neurologically intact ABD soft Neurovascular intact Sensation intact distally Intact pulses distally Dorsiflexion/Plantar flexion intact Incision: scant drainage   Lab Results  Component Value Date   WBC 6.9 08/27/2014   HGB 9.9* 08/27/2014   HCT 29.7* 08/27/2014   MCV 92.8 08/27/2014   PLT 184 08/27/2014   BMET    Component Value Date/Time   NA 129* 08/28/2014 0536   K 5.1 08/28/2014 0536   CL 95* 08/28/2014 0536   CO2 23 08/28/2014 0536   GLUCOSE 159* 08/28/2014 0536   BUN 64* 08/28/2014 0536   CREATININE 2.67* 08/28/2014 0536   CALCIUM 8.6* 08/28/2014 0536   GFRNONAA 24* 08/28/2014 0536   GFRAA 28* 08/28/2014 0536     Assessment/Plan: 2 Days Post-Op   Principal Problem:   Fracture, intertrochanteric, left femur Active Problems:   Essential hypertension, benign   Persistent atrial fibrillation   Automatic implantable cardioverter-defibrillator in situ   DM2 (diabetes mellitus, type 2)   OSA (obstructive sleep apnea), intolerant to CPAP/BIPAP   Ventricular tachycardia   Chronic systolic CHF (congestive heart failure)   Hip fracture   Left femoral shaft fracture   Up with therapy WBAT in the LLE Xarelto for DVT prophylaxis   Hertha Gergen [name] 08/28/2014, 8:11 AM Cell [phone]

## 2014-08-28 NOTE — Clinical Social Work Note (Signed)
Clinical Social Work Assessment  Patient Details  Name: Larry [name] MRN: [id number] Date of Birth: 10-06-1950  Date of referral:  08/28/14               Reason for consult:  Facility Placement                Permission sought to share information with:  Facility Sport and exercise psychologist, Family Supports Permission granted to share information::  Yes, Verbal Permission Granted  Name::     Wife:  Ivo Moga  ([phone]   Daughter:  Verne Grain (c) [phone]   [place] [facility]  Agency::     Relationship::     Contact Information:     Housing/Transportation Living arrangements for the past 2 months:  [place] of Information:  Patient, Spouse Patient Interpreter Needed:  None Criminal Activity/Legal Involvement Pertinent to Current Situation/Hospitalization:  No - Comment as needed Significant Relationships:  Spouse, Adult Children Lives with:  Spouse Do you feel safe going back to the place where you live?  Yes Need for family participation in patient care:  Yes (Comment)  Care giving concerns:    Facilities manager / plan:   Employment status:  Disabled (Comment on whether or not currently receiving Disability) Insurance information:  Programmer, applications PT Recommendations:  [place] / Referral to community resources:  [place]  Patient/Family's Response to care:   Patient/Family's Understanding of and Emotional Response to Diagnosis, Current Treatment, and Prognosis:    Emotional Assessment Appearance:  Appears stated age Attitude/Demeanor/Rapport:  Sedated, Lethargic (Sleepy and would only arouse for short periods of time. ) Affect (typically observed):  Quiet, Unable to Assess (Very sleepy during visit. Spoke briely to CSW- was calm and pleasnat) Orientation:  Oriented to Self, Oriented to Place, Oriented to  Time Alcohol / Substance use:  Tobacco Use (Former smoker. 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 Possible future LVAD placement)   Offie Waide, Butch Penny T, LCSW 08/28/2014,5:00 PM

## 2014-08-28 NOTE — Plan of Care (Signed)
Problem: Phase III Progression Outcomes Goal: IV/normal saline lock discontinued Outcome: Not Applicable Date Met:  25/05/39 Patient is on continuous Milrinone here and at home

## 2014-08-28 NOTE — Procedures (Signed)
Pt refuses BiPAP for the evening but states that he will try it tomorrow with a nasal mask.

## 2014-08-28 NOTE — Progress Notes (Signed)
Physical Therapy Treatment Patient Details Name: Larry [name] MRN: [id number] DOB: [birth year] Today's Date: 08/28/2014    History of Present Illness Larry [name] is a 64 y.o. male who complains of left hip pain after sustaining a mechanical fall at home. Pt with left intertrochanteric fx s/p IM nail. PMHx NICM, CAD, DDD, back pain, CVA, obesity    PT Comments    Pt able to ambulate 5' x 2 again today with sitting rest break. Pt c/o more of fatigue/weakness in legs than pain during gait.  He requires multi-modal cueing for proper sequencing with gait and transitional movements.  Con't to recommend SNF.  Follow Up Recommendations  SNF;Supervision for mobility/OOB     Equipment Recommendations  Rolling walker with 5" wheels;3in1 (PT)    Recommendations for Other Services       Precautions / Restrictions Precautions Precautions: Fall Restrictions Weight Bearing Restrictions: Yes LLE Weight Bearing: Weight bearing as tolerated    Mobility  Bed Mobility Overal bed mobility: Needs Assistance Bed Mobility: Supine to Sit;Sit to Supine     Supine to sit: Mod assist;+2 for physical assistance Sit to supine: Max assist;+2 for physical assistance   General bed mobility comments: A for legs and for trunk  Transfers Overall transfer level: Needs assistance Equipment used: Rolling walker (2 wheeled) Transfers: Sit to/from Stand Sit to Stand: +2 physical assistance;Min assist         General transfer comment: Cues for hand placement and for technique  Ambulation/Gait Ambulation/Gait assistance: Min assist;+2 safety/equipment Ambulation Distance (Feet): 5 Feet (x2) Assistive device: Rolling walker (2 wheeled) Gait Pattern/deviations: Step-to pattern;Trunk flexed;Antalgic;Decreased stance time - left Gait velocity: decreased   General Gait Details: Pt ambulated 5' with 100% cueing for sequencing. Noted R leg weaknesswhen in stance phase. Sitting rest break, then stood and  turned and ambulated back to bed.  Turning was difficult for pt and needed heavy cueing for technique and directions.   Stairs            Wheelchair Mobility    Modified Rankin (Stroke Patients Only)       Balance Overall balance assessment: Needs assistance Sitting-balance support: Feet supported;Bilateral upper extremity supported Sitting balance-Leahy Scale: Fair     Standing balance support: Bilateral upper extremity supported Standing balance-Leahy Scale: Poor                      Cognition Arousal/Alertness: Lethargic Behavior During Therapy: WFL for tasks assessed/performed;Restless Overall Cognitive Status: Impaired/Different from baseline         Following Commands: Follows one step commands consistently;Follows one step commands with increased time Safety/Judgement: Decreased awareness of safety;Decreased awareness of deficits   Problem Solving: Slow processing;Decreased initiation;Difficulty sequencing;Requires verbal cues;Requires tactile cues      Exercises General Exercises - Lower Extremity Ankle Circles/Pumps: Strengthening;Both;10 reps Gluteal Sets: Strengthening;Supine;5 reps Heel Slides: AAROM;Left;5 reps;Supine Hip ABduction/ADduction: AAROM;5 reps;Supine    General Comments        Pertinent Vitals/Pain Pain Assessment: 0-10 Pain Score: 8  Pain Location: L hip, back Pain Descriptors / Indicators: Operative site guarding;Aching;Guarding Pain Intervention(s): Limited activity within patient's tolerance;Monitored during session;Repositioned    Home Living                      Prior Function            PT Goals (current goals can now be found in the care plan section) Acute Rehab PT Goals Patient Stated  Goal: return home able to care for himself PT Goal Formulation: With family Time For Goal Achievement: 09/10/14 Potential to Achieve Goals: Fair Progress towards PT goals: Progressing toward goals    Frequency   Min 3X/week    PT Plan Current plan remains appropriate    Co-evaluation             End of Session Equipment Utilized During Treatment: Gait belt;Oxygen Activity Tolerance: Patient limited by lethargy;Patient limited by fatigue Patient left: in bed;with call bell/phone within reach;with family/visitor present     Time: 0201-0226 PT Time Calculation (min) (ACUTE ONLY): 25 min  Charges:  $Gait Training: 8-22 mins $Therapeutic Exercise: 8-22 mins                    G Codes:      Larry [name] 08/28/2014, 3:54 PM

## 2014-08-28 NOTE — Clinical Social Work Placement (Signed)
   CLINICAL SOCIAL WORK PLACEMENT  NOTE  Date:  08/28/2014  Patient Details  Name: Larry [name] MRN: [id number] Date of Birth: 19-May-1950  Clinical Social Work is seeking post-discharge placement for this patient at the [facility] level of care (*CSW will initial, date and re-position this form in  chart as items are completed):  Yes   Patient/family provided with [facility] Work Department's list of facilities offering this level of care within the geographic area requested by the patient (or if unable, by the patient's family).  Yes   Patient/family informed of their freedom to choose among providers that offer the needed level of care, that participate in Medicare, Medicaid or managed care program needed by the patient, have an available bed and are willing to accept the patient.  Yes   Patient/family informed of [facility]'s ownership interest in [facility] and [facility], as well as of the fact that they are under no obligation to receive care at these facilities.  PASRR submitted to EDS on 08/21/14     PASRR number received on       Existing PASRR number confirmed on       FL2 transmitted to all facilities in geographic area requested by pt/family on 08/28/14 (On Milirone- only sent to SNF's that accept Milrinone)     FL2 transmitted to all facilities within larger geographic area on       Patient informed that his/her managed care company has contracts with or will negotiate with certain facilities, including the following:   [facility]         Patient/family informed of bed offers received.  Patient chooses bed at       Physician recommends and patient chooses bed at      Patient to be transferred to   on  .  Patient to be transferred to facility by Ambulance [facility])     Patient family notified on   of transfer.  Name of family member notified:        PHYSICIAN Please prepare priority discharge  summary, including medications, Please sign FL2, Please prepare prescriptions     Additional Comment:    _______________________________________________ Williemae Area, LCSW 08/28/2014, 5:20 PM [phone]

## 2014-08-28 NOTE — Progress Notes (Signed)
[facility] PROGRESS NOTE  Larry [name] WLN:[id number] DOB: Oct 08, 1950 DOA: 08/25/2014 PCP: [name], MD  Assessment/Plan: 1. Mechanical trip and fall with left femoral intertrochanteric fracture.  -status post intramedullary nail to left hip on 7/19 -Fortunately no immediate complications -WBAT -PT/OT evaluation -Plan for discharge to SNF when medically stable  2. Severe nonischemic cardiomyopathy with chronic systolic heart failure with an EF of 25%. History of V. tach. Patient has AICD, on home milrinone drip, currently appears compensated, home medications which include beta blocker, hydralazine, milrinone drip, digoxin and amiodarone will all be continued; except for metoprolol on hold as per rec's from Heart failure team -He is not on ACE/ARB due to renal failure -cardiology following  3. Paroxysmal atrial fibrillation.  -Xarelto restarted  4. ARF on chronic kidney disease stage IV. Baseline creatinine appears to be 1.9, patient actually at this time appears to be dehydrated I think due to his recent dental surgery 2 days prior to [place].  -will hold torsemide; plan is to use lasix -heart failure team on board, will follow rec's -Avoid ACE/ARB.  -no IVF's -daily weight and strict I's and O's -Cr (7/19) 2.39 will follow renal function trend   6. Hyponatremia.  -Sodium stable at 129  7. Obstructive sleep apnea.  -Continue daily at bedtime BiPAP  8. DM type II. recent A1c of 7.7 -continue Lantus and SSI -Monitor CBGs closely.   9.  Hypothyroidism and pituitary microadenoma. No acute issues, continue home dose Synthroid.   Code Status: Full Family Communication: wife at bedside Disposition Plan: to be determine, remains inpatient    Consultants:  Ortho (Dr. Percell Miller)  HF team   Procedures:  S/P intramedullary nail on left hip on 7/18  Antibiotics:  None   HPI/Subjective: Patient reports having some left hip pain, somewhat sedated  during my evaluation after receiving narcotic analgesia  Objective: Filed Vitals:   08/28/14 1057  BP:   Pulse: 85  Temp:   Resp: 16    Intake/Output Summary (Last 24 hours) at 08/28/14 1520 Last data filed at 08/28/14 1100  Gross per 24 hour  Intake 1235.6 ml  Output   1150 ml  Net   85.6 ml   Filed Weights   08/26/14 0520 08/27/14 0430 08/28/14 0613  Weight: 102.422 kg (225 lb 12.8 oz) 104.554 kg (230 lb 8 oz) 105.098 kg (231 lb 11.2 oz)    Exam:   General:  Afebrile, slightly confused from pain meds, still complaining of pain on his LLE, but much better. Patient with very agitated night.   Cardiovascular:norubs, no gallops, RRR  Respiratory: no frank crackles, no wheezing   Abdomen: soft, NT, ND, positive BS  Musculoskeletal: no edema, no cyanosis, LLE with pain (worse with movement). Surgical incision site appears clean evidence of infection or active bleeding  Data Reviewed: Basic Metabolic Panel:  Recent Labs Lab 08/23/14 0420  08/24/14 1000 08/25/14 1458 08/26/14 0605 08/27/14 0506 08/28/14 0536  NA  --   < > 130* 127* 129* 127* 129*  K  --   < > 4.6 4.7 4.6 4.7 5.1  CL  --   < > 95* 93* 95* 95* 95*  CO2  --   < > 27 23 25 23 23   GLUCOSE  --   < > 280* 233* 172* 211* 159*  BUN  --   < > 31* 44* 47* 51* 64*  CREATININE  --   < > 1.81* 2.46* 2.29* 2.39* 2.67*  CALCIUM  --   < >  8.8* 8.9 8.6* 8.3* 8.6*  MG 2.3  --   --   --  2.1  --   --   < > = values in this interval not displayed. CBC:  Recent Labs Lab 08/22/14 0506 08/23/14 0500 08/24/14 0530 08/25/14 1458 08/26/14 0605 08/27/14 0805  WBC 5.1 7.2 9.3 8.5 7.5 6.9  NEUTROABS 3.4 5.2 7.2 6.8  --   --   HGB 9.7* 10.5* 10.8* 9.9* 8.8* 9.9*  HCT 30.3* 33.0* 33.3* 30.7* 27.5* 29.7*  MCV 99.7 98.5 98.8 95.6 94.8 92.8  PLT 235 257 285 241 209 184   BNP (last 3 results)  Recent Labs  05/22/14 1029 05/27/14 1126 06/19/14 1030  BNP 935.2* 1627.8* 738.9*    ProBNP (last 3  results)  Recent Labs  10/09/13 0926 11/14/13 0926 01/14/14 1112  PROBNP 1561.0* 1261.0* 5769.0*   CBG:  Recent Labs Lab 08/27/14 0821 08/27/14 1159 08/27/14 1715 08/27/14 2041 08/28/14 0616  GLUCAP 170* 155* 152* 189* 160*    Recent Results (from the past 240 hour(s))  MRSA PCR Screening     Status: None   Collection Time: 08/21/14  6:50 PM  Result Value Ref Range Status   MRSA by PCR NEGATIVE NEGATIVE Final    Comment:        The GeneXpert MRSA Assay (FDA approved for NASAL specimens only), is one component of a comprehensive MRSA colonization surveillance program. It is not intended to diagnose MRSA infection nor to guide or monitor treatment for MRSA infections.   Surgical pcr screen     Status: None   Collection Time: 08/21/14 10:38 PM  Result Value Ref Range Status   MRSA, PCR NEGATIVE NEGATIVE Final   Staphylococcus aureus NEGATIVE NEGATIVE Final    Comment:        The Xpert SA Assay (FDA approved for NASAL specimens in patients over 61 years of age), is one component of a comprehensive surveillance program.  Test performance has been validated by [facility] for patients greater than or equal to 48 year old. It is not intended to diagnose infection nor to guide or monitor treatment.   Urine culture     Status: None   Collection Time: 08/25/14  4:12 PM  Result Value Ref Range Status   Specimen Description URINE, CLEAN CATCH  Final   Special Requests NONE  Final   Culture NO GROWTH 1 DAY  Final   Report Status 08/26/2014 FINAL  Final     Studies: Pelvis Portable  08/26/2014   CLINICAL DATA:  LEFT femoral nail surgery, hip fracture  EXAM: PORTABLE PELVIS 1-2 VIEWS  COMPARISON:  Portable exam 2107 hours compared intraoperative images of 08/16/2014  FINDINGS: IM nail with compression screw at proximal LEFT femur post ORIF of a nondisplaced LEFT femoral neck fracture.  Distal extent of hardware not imaged.  Hip joint spaces mildly narrowed  bilaterally.  Pelvis intact.  No additional fractures identified.  IMPRESSION: Post ORIF proximal LEFT femur.   Electronically Signed   By: Lavonia Dana M.D.   On: 08/26/2014 21:44   Dg C-arm 1-60 Min  08/26/2014   CLINICAL DATA:  ORIF left femur.  EXAM: DG C-ARM 61-120 MIN  COMPARISON:  None.  FINDINGS: ORIF left femur. Good anatomic alignment. 0 minutes 55 seconds fluoroscopic time. Three images.   Electronically Signed   By: [place]   On: 08/26/2014 17:08   Dg Femur Min 2 Views Left  08/26/2014   CLINICAL DATA:  ORIF  left femur.  EXAM: LEFT FEMUR 2 VIEWS  COMPARISON:  None.  FINDINGS: Open reduction internal fixation left femur with good anatomic alignment. Hardware intact. Vascular calcification .  IMPRESSION: ORIF left femur .   Electronically Signed   By: Marcello Moores  Register   On: 08/26/2014 17:07    Scheduled Meds: . sodium chloride   Intravenous Once  . sodium chloride  10 mL/hr Intravenous Once  . ALPRAZolam  0.5 mg Oral QHS  . amiodarone  200 mg Oral Daily  . antiseptic oral rinse  7 mL Mouth Rinse BID  . atorvastatin  40 mg Oral q1800  . bisacodyl  5 mg Oral QHS  . DULoxetine  60 mg Oral BID  . hydrALAZINE  100 mg Oral TID  . insulin aspart  0-9 Units Subcutaneous TID AC & HS  . insulin glargine  15 Units Subcutaneous BID  . levothyroxine  75 mcg Oral QAC breakfast  . rivaroxaban  15 mg Oral Q supper  . sildenafil  80 mg Oral TID   Continuous Infusions: . sodium chloride Stopped (08/26/14 1645)  . milrinone 0.5 mcg/kg/min (08/28/14 0925)    Principal Problem:   Fracture, intertrochanteric, left femur Active Problems:   Essential hypertension, benign   Persistent atrial fibrillation   Automatic implantable cardioverter-defibrillator in situ   DM2 (diabetes mellitus, type 2)   OSA (obstructive sleep apnea), intolerant to CPAP/BIPAP   Ventricular tachycardia   Chronic systolic CHF (congestive heart failure)   Hip fracture   Left femoral shaft fracture    Time  spent: 25 minutes   Kelvin Cellar  [facility] Pager [phone]. If 7PM-7AM, please contact night-coverage at [email or web address], password [facility] 08/28/2014, 3:20 PM  LOS: 3 days

## 2014-08-28 NOTE — Progress Notes (Signed)
Advanced Heart Failure Rounding Note   Subjective:    Larry [name] is a 64 y/o man with history of chronic systolic CHF (nonischemic cardiomyopathy) s/p [employer]. 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 He is on chronic milrinone 0.5 mcg.  Prior to admit he was being considered for LVAD.   Just dischaged 7/16 after dental procedures. Also had RHC with elevate pulmonary pressures. Revatio was increased to 80 mg tid.   Admitted with fall---> broken L  hip . 7/18 had IM Nail L femur.   Over night had difficulty with pain. Denies SOB. Says pain better.        Objective:   Weight Range:  Vital Signs:   Temp:  [97.9 F (36.6 C)-98.6 F (37 C)] 98.5 F (36.9 C) (07/20 1055) Pulse Rate:  [80-91] 85 (07/20 1057) Resp:  [16-18] 16 (07/20 1057) BP: (110-135)/(41-64) 116/51 mmHg (07/20 1055) SpO2:  [92 %-96 %] 95 % (07/20 1055) Weight:  [231 lb 11.2 oz (105.098 kg)] 231 lb 11.2 oz (105.098 kg) (07/20 0613) Last BM Date: 08/23/14 (given prn miralax)  Weight change: Filed Weights   08/26/14 0520 08/27/14 0430 08/28/14 0613  Weight: 225 lb 12.8 oz (102.422 kg) 230 lb 8 oz (104.554 kg) 231 lb 11.2 oz (105.098 kg)    Intake/Output:   Intake/Output Summary (Last 24 hours) at 08/28/14 1145 Last data filed at 08/28/14 0840  Gross per 24 hour  Intake 1159.6 ml  Output    850 ml  Net  309.6 ml     Physical Exam: General:  In bed. Wife at bed side.  HEENT: normal Neck: supple. JVP 8-9  . Carotids 2+ bilat; no bruits. No lymphadenopathy or thryomegaly appreciated. Cor: PMI nondisplaced. Regular rate & rhythm. No rubs, gallops or murmurs. Lungs: clear Abdomen: soft, nontender, nondistended. No hepatosplenomegaly. No bruits or masses. Good bowel sounds. Extremities: no cyanosis, clubbing, rash, edema. RUE PICC dual lumen . R Thigh lateral aspect dressing intact x2.  Neuro: Pleasantly confused. Cranial nerves  grossly intact. moves all 4 extremities w/o difficulty. Affect pleasant  Telemetry: NSR PVCs. Long PR   Labs: Basic Metabolic Panel:  Recent Labs Lab 08/23/14 0420  08/24/14 1000 08/25/14 1458 08/26/14 0605 08/27/14 0506 08/28/14 0536  NA  --   < > 130* 127* 129* 127* 129*  K  --   < > 4.6 4.7 4.6 4.7 5.1  CL  --   < > 95* 93* 95* 95* 95*  CO2  --   < > 27 23 25 23 23   GLUCOSE  --   < > 280* 233* 172* 211* 159*  BUN  --   < > 31* 44* 47* 51* 64*  CREATININE  --   < > 1.81* 2.46* 2.29* 2.39* 2.67*  CALCIUM  --   < > 8.8* 8.9 8.6* 8.3* 8.6*  MG 2.3  --   --   --  2.1  --   --   < > = values in this interval not displayed.  Liver Function Tests: No results for input(s): AST, ALT, ALKPHOS, BILITOT, PROT, ALBUMIN in the last 168 hours. No results for input(s): LIPASE, AMYLASE in the last 168 hours. No results for input(s): AMMONIA in the last 168 hours.  CBC:  Recent Labs Lab 08/22/14 0506 08/23/14 0500 08/24/14 0530 08/25/14 1458 08/26/14 0605 08/27/14 0805  WBC 5.1 7.2 9.3 8.5 7.5 6.9  NEUTROABS 3.4 5.2 7.2  6.8  --   --   HGB 9.7* 10.5* 10.8* 9.9* 8.8* 9.9*  HCT 30.3* 33.0* 33.3* 30.7* 27.5* 29.7*  MCV 99.7 98.5 98.8 95.6 94.8 92.8  PLT 235 257 285 241 209 184    Cardiac Enzymes: No results for input(s): CKTOTAL, CKMB, CKMBINDEX, TROPONINI in the last 168 hours.  BNP: BNP (last 3 results)  Recent Labs  05/22/14 1029 05/27/14 1126 06/19/14 1030  BNP 935.2* 1627.8* 738.9*    ProBNP (last 3 results)  Recent Labs  10/09/13 0926 11/14/13 0926 01/14/14 1112  PROBNP 1561.0* 1261.0* 5769.0*      Other results:  Imaging: Pelvis Portable  08/26/2014   CLINICAL DATA:  LEFT femoral nail surgery, hip fracture  EXAM: PORTABLE PELVIS 1-2 VIEWS  COMPARISON:  Portable exam 2107 hours compared intraoperative images of 08/16/2014  FINDINGS: IM nail with compression screw at proximal LEFT femur post ORIF of a nondisplaced LEFT femoral neck fracture.  Distal  extent of hardware not imaged.  Hip joint spaces mildly narrowed bilaterally.  Pelvis intact.  No additional fractures identified.  IMPRESSION: Post ORIF proximal LEFT femur.   Electronically Signed   By: Lavonia Dana M.D.   On: 08/26/2014 21:44   Dg C-arm 1-60 Min  08/26/2014   CLINICAL DATA:  ORIF left femur.  EXAM: DG C-ARM 61-120 MIN  COMPARISON:  None.  FINDINGS: ORIF left femur. Good anatomic alignment. 0 minutes 55 seconds fluoroscopic time. Three images.   Electronically Signed   By: Marcello Moores  Register   On: 08/26/2014 17:08   Dg Femur Min 2 Views Left  08/26/2014   CLINICAL DATA:  ORIF left femur.  EXAM: LEFT FEMUR 2 VIEWS  COMPARISON:  None.  FINDINGS: Open reduction internal fixation left femur with good anatomic alignment. Hardware intact. Vascular calcification .  IMPRESSION: ORIF left femur .   Electronically Signed   By: Marcello Moores  Register   On: 08/26/2014 17:07     Medications:     Scheduled Medications: . sodium chloride   Intravenous Once  . sodium chloride  10 mL/hr Intravenous Once  . ALPRAZolam  0.5 mg Oral QHS  . amiodarone  200 mg Oral Daily  . antiseptic oral rinse  7 mL Mouth Rinse BID  . atorvastatin  40 mg Oral q1800  . bisacodyl  5 mg Oral QHS  . digoxin  0.0625 mg Oral Daily  . DULoxetine  60 mg Oral BID  . furosemide  80 mg Intravenous BID  . hydrALAZINE  100 mg Oral TID  . insulin aspart  0-9 Units Subcutaneous TID AC & HS  . insulin glargine  15 Units Subcutaneous BID  . levothyroxine  75 mcg Oral QAC breakfast  . rivaroxaban  15 mg Oral Q supper  . sildenafil  80 mg Oral TID    Infusions: . sodium chloride Stopped (08/26/14 1645)  . milrinone 0.5 mcg/kg/min (08/28/14 0925)    PRN Medications: albuterol, ALPRAZolam **AND** ALPRAZolam, morphine injection, ondansetron (ZOFRAN) IV, oxyCODONE-acetaminophen **AND** oxyCODONE, polyethylene glycol, sodium chloride, zolpidem   Assessment:  1. Left Hip Fracture  2. Chronic systolic CHF- on home milrinone  0.15mcg 3. CKD stage III-  4.  Paroxysmal atrial fibrillation requiring DCCV 02/2014, 04/2014- on chronic amio and xarelto. CHADS VASc Score- 5 5. Diabetes mellitus type 2 6. Depression/anxiety 7. OSA, complex- Needs Bipap at night but had difficulty after dental procedure.  8 Pulmonary HTN - Mixed pulmonary venous hypertension and PAH- on  9. Dental extractions 08/2014 by Dr Enrique Sack  10. Obesity Body mass index is 32.39 kg/(m^2). 11. Probable chronic respiratory failure - requiring home O2 with ambulation as of this admission 12. Hyponatremia - NA 129    Plan/Discussion:     Post op Day # 3--> IM Nail L femur. On  Xarelto per ortho.    Continue milrinone 0.5 mcg,. Stop dig with elevated creatinine. Stop IV lasix. Check BMET in am.    Continue revatio 80 mg tid. Needs to try and use Bipap tonight. Hopefully pain from dental extractions wont be an issue.   Sinus Rhythm with PVCs. Long PR interval noted on EKG . Continue amio. Stop metoprolol .   Disposition- To SNF when stable. Next 24-48 hours. Wife plans to look at [facility].   Length of Stay: 3   CLEGG,AMY NP-C  08/28/2014, 11:45 AM  Advanced Heart Failure Team Pager [phone] (M-F; [place])  Please contact [facility] for night-coverage after hours (4p -7a ) and weekends on [email or web address]  Patient seen and examined with Darrick Grinder, NP. We discussed all aspects of the encounter. I agree with the assessment and plan as stated above.   Overall stable but creatinine slightly worse. Will hold dig and lasix. Continue milrinone. Follow closely.   Bensimhon, Daniel,MD 10:54 PM

## 2014-08-29 ENCOUNTER — Inpatient Hospital Stay (HOSPITAL_COMMUNITY): Payer: Commercial Managed Care - HMO

## 2014-08-29 DIAGNOSIS — N179 Acute kidney failure, unspecified: Secondary | ICD-10-CM

## 2014-08-29 DIAGNOSIS — N189 Chronic kidney disease, unspecified: Secondary | ICD-10-CM

## 2014-08-29 LAB — BASIC METABOLIC PANEL
Anion gap: 9 (ref 5–15)
BUN: 75 mg/dL — ABNORMAL HIGH (ref 6–20)
CO2: 23 mmol/L (ref 22–32)
CREATININE: 3.32 mg/dL — AB (ref 0.61–1.24)
Calcium: 8.9 mg/dL (ref 8.9–10.3)
Chloride: 96 mmol/L — ABNORMAL LOW (ref 101–111)
GFR calc Af Amer: 21 mL/min — ABNORMAL LOW (ref >60)
GFR calc non Af Amer: 18 mL/min — ABNORMAL LOW (ref >60)
GLUCOSE: 183 mg/dL — AB (ref 65–99)
POTASSIUM: 5.2 mmol/L — AB (ref 3.5–5.1)
Sodium: 128 mmol/L — ABNORMAL LOW (ref 135–145)

## 2014-08-29 LAB — CARBOXYHEMOGLOBIN
Carboxyhemoglobin: 1.5 % (ref 0.5–1.5)
Carboxyhemoglobin: 1.7 % — ABNORMAL HIGH (ref 0.5–1.5)
METHEMOGLOBIN: 1.1 % (ref 0.0–1.5)
METHEMOGLOBIN: 0.9 % (ref 0.0–1.5)
O2 Saturation: 86.1 %
O2 Saturation: 99.3 %
TOTAL HEMOGLOBIN: 8.9 g/dL — AB (ref 13.5–18.0)
Total hemoglobin: 9.3 g/dL — ABNORMAL LOW (ref 13.5–18.0)

## 2014-08-29 LAB — GLUCOSE, CAPILLARY
GLUCOSE-CAPILLARY: 173 mg/dL — AB (ref 65–99)
GLUCOSE-CAPILLARY: 164 mg/dL — AB (ref 65–99)
Glucose-Capillary: 199 mg/dL — ABNORMAL HIGH (ref 65–99)
Glucose-Capillary: 197 mg/dL — ABNORMAL HIGH (ref 65–99)

## 2014-08-29 LAB — CBC
HCT: 29.5 % — ABNORMAL LOW (ref 39.0–52.0)
HEMOGLOBIN: 9.6 g/dL — AB (ref 13.0–17.0)
MCH: 30.4 pg (ref 26.0–34.0)
MCHC: 32.5 g/dL (ref 30.0–36.0)
MCV: 93.4 fL (ref 78.0–100.0)
Platelets: 240 10*3/uL (ref 150–400)
RBC: 3.16 MIL/uL — ABNORMAL LOW (ref 4.22–5.81)
RDW: 15.6 % — ABNORMAL HIGH (ref 11.5–15.5)
WBC: 6.8 10*3/uL (ref 4.0–10.5)

## 2014-08-29 MED ORDER — BISACODYL 10 MG RE SUPP
10.0000 mg | Freq: Once | RECTAL | Status: AC
  Administered 2014-08-29: 10 mg via RECTAL
  Filled 2014-08-29: qty 1

## 2014-08-29 NOTE — Progress Notes (Signed)
Advanced Heart Failure Rounding Note   Subjective:    Larry [name] is a 64 y/o man with history of chronic systolic CHF (nonischemic cardiomyopathy) s/p St. 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 He is on chronic milrinone 0.5 mcg.  Prior to admit he was being considered for LVAD.   Dischaged 7/16 after dental procedures. Also had RHC with elevate pulmonary pressures. Revatio was increased to 80 mg tid during that stay.  Admitted with fall---> broken L  hip . 7/18 had IM Nail L femur.    Says pain from hip is much better controlled. Denies SOB, palpitations, lightheadedness, or CP. Has not peed yet today.  Pt and wife states he peed very little yesterday, and then peed 400+cc all at once yesterday evening.  Fluid level up overall this admission, but holding diuresis 2/2 kidney function.       Objective:   Weight Range:  Vital Signs:   Temp:  [98 F (36.7 C)-98.5 F (36.9 C)] 98.5 F (36.9 C) (07/21 0532) Pulse Rate:  [82-88] 83 (07/21 0532) Resp:  [16-18] 18 (07/21 0532) BP: (108-123)/(48-57) 120/52 mmHg (07/21 0532) SpO2:  [95 %-98 %] 98 % (07/21 0532) Weight:  [232 lb (105.235 kg)] 232 lb (105.235 kg) (07/21 0532) Last BM Date: 08/23/14  Weight change: Filed Weights   08/27/14 0430 08/28/14 0613 08/29/14 0532  Weight: 230 lb 8 oz (104.554 kg) 231 lb 11.2 oz (105.098 kg) 232 lb (105.235 kg)    Intake/Output:   Intake/Output Summary (Last 24 hours) at 08/29/14 0807 Last data filed at 08/29/14 0600  Gross per 24 hour  Intake 1517.63 ml  Output    775 ml  Net 742.63 ml     Physical Exam: General:  In bed. Wife at bed side.  HEENT: normal Neck: supple. JVP 8-9  . Carotids 2+ bilat; no bruits. No lymphadenopathy or thryomegaly appreciated. Cor: PMI nondisplaced. Regular rate & rhythm. No rubs, gallops or murmurs. Lungs: CTA Abdomen: soft, nontender, nondistended. No hepatosplenomegaly.  No bruits or masses. Good bowel sounds. Extremities: no cyanosis, clubbing, rash, edema noted. RUE PICC dual lumen . R Thigh lateral aspect dressing intact x2.  Neuro: Pleasantly confused. Cranial nerves grossly intact. moves all 4 extremities w/o difficulty. Affect pleasant  Telemetry: NSR 80s PVCs. Long PR. Several episodes of 4-5 beats of NSVT.  Labs: Basic Metabolic Panel:  Recent Labs Lab 08/23/14 0420  08/25/14 1458 08/26/14 0605 08/27/14 0506 08/28/14 0536 08/29/14 0500  NA  --   < > 127* 129* 127* 129* 128*  K  --   < > 4.7 4.6 4.7 5.1 5.2*  CL  --   < > 93* 95* 95* 95* 96*  CO2  --   < > 23 25 23 23 23   GLUCOSE  --   < > 233* 172* 211* 159* 183*  BUN  --   < > 44* 47* 51* 64* 75*  CREATININE  --   < > 2.46* 2.29* 2.39* 2.67* 3.32*  CALCIUM  --   < > 8.9 8.6* 8.3* 8.6* 8.9  MG 2.3  --   --  2.1  --   --   --   < > = values in this interval not displayed.  Liver Function Tests: No results for input(s): AST, ALT, ALKPHOS, BILITOT, PROT, ALBUMIN in the last 168 hours. No results for input(s): LIPASE, AMYLASE in the last 168 hours. No results  for input(s): AMMONIA in the last 168 hours.  CBC:  Recent Labs Lab 08/23/14 0500 08/24/14 0530 08/25/14 1458 08/26/14 0605 08/27/14 0805 08/29/14 0500  WBC 7.2 9.3 8.5 7.5 6.9 6.8  NEUTROABS 5.2 7.2 6.8  --   --   --   HGB 10.5* 10.8* 9.9* 8.8* 9.9* 9.6*  HCT 33.0* 33.3* 30.7* 27.5* 29.7* 29.5*  MCV 98.5 98.8 95.6 94.8 92.8 93.4  PLT 257 285 241 209 184 240    Cardiac Enzymes: No results for input(s): CKTOTAL, CKMB, CKMBINDEX, TROPONINI in the last 168 hours.  BNP: BNP (last 3 results)  Recent Labs  05/22/14 1029 05/27/14 1126 06/19/14 1030  BNP 935.2* 1627.8* 738.9*    ProBNP (last 3 results)  Recent Labs  10/09/13 0926 11/14/13 0926 01/14/14 1112  PROBNP 1561.0* 1261.0* 5769.0*      Other results:  Imaging: No results found.   Medications:     Scheduled Medications: . sodium chloride    Intravenous Once  . sodium chloride  10 mL/hr Intravenous Once  . ALPRAZolam  0.5 mg Oral QHS  . amiodarone  200 mg Oral Daily  . antiseptic oral rinse  7 mL Mouth Rinse BID  . atorvastatin  40 mg Oral q1800  . bisacodyl  5 mg Oral QHS  . DULoxetine  60 mg Oral BID  . hydrALAZINE  100 mg Oral TID  . insulin aspart  0-9 Units Subcutaneous TID AC & HS  . insulin glargine  15 Units Subcutaneous BID  . levothyroxine  75 mcg Oral QAC breakfast  . rivaroxaban  15 mg Oral Q supper  . sildenafil  80 mg Oral TID    Infusions: . sodium chloride 10 mL/hr at 08/29/14 0543  . milrinone 0.5 mcg/kg/min (08/29/14 0543)    PRN Medications: albuterol, ALPRAZolam **AND** ALPRAZolam, morphine injection, ondansetron (ZOFRAN) IV, oxyCODONE-acetaminophen **AND** oxyCODONE, polyethylene glycol, sodium chloride, zolpidem   Assessment:  1. Left Hip Fracture  2. Chronic systolic CHF- on home milrinone 0.50mcg 3. CKD stage III-  4.  Paroxysmal atrial fibrillation requiring DCCV 02/2014, 04/2014- on chronic amio and xarelto. CHADS VASc Score- 5 5. Diabetes mellitus type 2 6. Depression/anxiety 7. OSA, complex- Needs Bipap at night but had difficulty after dental procedure.  8 Pulmonary HTN - Mixed pulmonary venous hypertension and PAH- on  9. Dental extractions 08/2014 by Dr Enrique Sack 10. Obesity Body mass index is 32.39 kg/(m^2). 11. Probable chronic respiratory failure - requiring home O2 with ambulation as of this admission 12. Hyponatremia - NA 129    Plan/Discussion:     Post op Day # 3 (08/26/14) --> IM Nail L femur. On  Xarelto per ortho.    Continue milrinone 0.5 mcg. Continue to hold dig and lasix elevated creatinine. Follow BMETs. May need Neph consult.   Hyperkalemic at 5.2 today. Will consult MD for best management.  Continue revatio 80 mg tid. Refused Bipap last night but says he will try tonight with nasal mask if still here. Hopeful pain from dental extractions wont be an issue.   Sinus  Rhythm with PVCs. Long PR interval noted on EKG . Continue amio. Metoprolol stopped.  Disposition- Transfer to stepdown for CVP/Coox with worsening renal function.   Length of Stay: 4   Shirley Friar PA-C  08/29/2014, 8:07 AM  Advanced Heart Failure Team Pager [phone] (M-F; 7a - 4p)  Please contact [facility] for night-coverage after hours (4p -7a ) and weekends on [email or web address]   Patient seen and examined  with Oda Kilts, PA-C. We discussed all aspects of the encounter. I agree with the assessment and plan as stated above.   Renal function worse today. He was moved to SDU. Co-ox appears high unsure if it is accurate. CVP 15. Suspect he may have component of ATN. Will continue to hold lasix. Continue milrinone. Agree with treatment of ileus. Hopefully kidney function will improve soon.   Larry Azer,MD 5:26 PM

## 2014-08-29 NOTE — Progress Notes (Signed)
Report given to Judson Roch, RN 2H receiving nurse.

## 2014-08-29 NOTE — Progress Notes (Addendum)
   Nausea/Vomiting. ~500cc dark brown emesis. Hypoactive bowel sounds. No BM since 7/15.   KUB now.   NPO for now.   CLEGG,AMY NP-C  2:32 PM   Agree  Bensimhon, Daniel,MD 5:24 PM

## 2014-08-29 NOTE — Progress Notes (Signed)
Pt stated he has been sick all day, feeling queasy and would like to wait one more night before trying to wear cpap. RT informed pt that if he changes his mind during the night to call for RT and would come place him on CPAP.

## 2014-08-29 NOTE — Progress Notes (Signed)
[facility] PROGRESS NOTE  Larry [name] PPJ:[id number] DOB: 06-02-1950 DOA: 08/25/2014 PCP: [name], MD  Assessment/Plan:  1. Severe nonischemic cardiomyopathy with chronic systolic heart failure with an EF of 25%. History of V. tach. Patient has AICD, on home milrinone drip -Patient having a 3 kg weight gain since 08/26/2014. -Lab showing an upward trending creatinine to 3.32 from 2.67. His Lasix was held yesterday given deterioration of kidney function -Remains on milrinone drip.  -Heart failure team following  2. Nausea/vomiting -Patient having episode of nausea vomiting today, 500 cc of brown emesis noted -He was further worked up with KUB  -NG tube was placed as there is concern for ileus -Plan to repeat KUB in am.   3. Mechanical trip and fall with left femoral intertrochanteric fracture.  -status post intramedullary nail to left hip on 7/19 -WBAT -PT/OT evaluation -Plan for discharge to SNF when medically stable  4. Paroxysmal atrial fibrillation.  -Xarelto restarted  5. Hyponatremia.  -Lab showing sodium level of 128, it could be reflective of volume overload  6. Obstructive sleep apnea.  -Continue daily at bedtime BiPAP  7. DM type II. recent A1c of 7.7 -continue Lantus and SSI -Monitor CBGs closely.   8.  Hypothyroidism and pituitary microadenoma. No acute issues, continue home dose Synthroid.   Code Status: Full Family Communication: wife at bedside Disposition Plan: Patient was transferred to [facility] for closer monitoring   Consultants:  Ortho (Dr. Percell Miller)  HF team   Procedures:  S/P intramedullary nail on left hip on 7/18  Antibiotics:  None   HPI/Subjective: Patient denies worsening shortness of breath or chest pain. Stated hip pain better controlled  Objective: Filed Vitals:   08/29/14 1600  BP: 120/51  Pulse: 92  Temp:   Resp: 21    Intake/Output Summary (Last 24 hours) at 08/29/14 1633 Last data filed at 08/29/14  1600  Gross per 24 hour  Intake 1580.23 ml  Output   1325 ml  Net 255.23 ml   Filed Weights   08/27/14 0430 08/28/14 0613 08/29/14 0532  Weight: 104.554 kg (230 lb 8 oz) 105.098 kg (231 lb 11.2 oz) 105.235 kg (232 lb)    Exam:   General:  He is awake and alert, oriented, follows commands now  Cardiovascular:norubs, no gallops, RRR  Respiratory: Has bibasilar crackles, no wheezing rhonchi, normal respiratory effort  Abdomen: soft, NT, ND, positive BS Musculoskeletal: Surgical incision site appears clean, no evidence of infection. Has 1-2+ bilateral lower extremity pitting edema Data Reviewed: Basic Metabolic Panel:  Recent Labs Lab 08/23/14 0420  08/25/14 1458 08/26/14 0605 08/27/14 0506 08/28/14 0536 08/29/14 0500  NA  --   < > 127* 129* 127* 129* 128*  K  --   < > 4.7 4.6 4.7 5.1 5.2*  CL  --   < > 93* 95* 95* 95* 96*  CO2  --   < > 23 25 23 23 23   GLUCOSE  --   < > 233* 172* 211* 159* 183*  BUN  --   < > 44* 47* 51* 64* 75*  CREATININE  --   < > 2.46* 2.29* 2.39* 2.67* 3.32*  CALCIUM  --   < > 8.9 8.6* 8.3* 8.6* 8.9  MG 2.3  --   --  2.1  --   --   --   < > = values in this interval not displayed. CBC:  Recent Labs Lab 08/23/14 0500 08/24/14 0530 08/25/14 1458 08/26/14 5809 08/27/14 0805  08/29/14 0500  WBC 7.2 9.3 8.5 7.5 6.9 6.8  NEUTROABS 5.2 7.2 6.8  --   --   --   HGB 10.5* 10.8* 9.9* 8.8* 9.9* 9.6*  HCT 33.0* 33.3* 30.7* 27.5* 29.7* 29.5*  MCV 98.5 98.8 95.6 94.8 92.8 93.4  PLT 257 285 241 209 184 240   BNP (last 3 results)  Recent Labs  05/22/14 1029 05/27/14 1126 06/19/14 1030  BNP 935.2* 1627.8* 738.9*    ProBNP (last 3 results)  Recent Labs  10/09/13 0926 11/14/13 0926 01/14/14 1112  PROBNP 1561.0* 1261.0* 5769.0*   CBG:  Recent Labs Lab 08/28/14 0616 08/28/14 1735 08/28/14 2134 08/29/14 0615 08/29/14 1210  GLUCAP 160* 202* 193* 173* 199*    Recent Results (from the past 240 hour(s))  MRSA PCR Screening     Status:  None   Collection Time: 08/21/14  6:50 PM  Result Value Ref Range Status   MRSA by PCR NEGATIVE NEGATIVE Final    Comment:        The GeneXpert MRSA Assay (FDA approved for NASAL specimens only), is one component of a comprehensive MRSA colonization surveillance program. It is not intended to diagnose MRSA infection nor to guide or monitor treatment for MRSA infections.   Surgical pcr screen     Status: None   Collection Time: 08/21/14 10:38 PM  Result Value Ref Range Status   MRSA, PCR NEGATIVE NEGATIVE Final   Staphylococcus aureus NEGATIVE NEGATIVE Final    Comment:        The Xpert SA Assay (FDA approved for NASAL specimens in patients over 41 years of age), is one component of a comprehensive surveillance program.  Test performance has been validated by [facility] for patients greater than or equal to 63 year old. It is not intended to diagnose infection nor to guide or monitor treatment.   Urine culture     Status: None   Collection Time: 08/25/14  4:12 PM  Result Value Ref Range Status   Specimen Description URINE, CLEAN CATCH  Final   Special Requests NONE  Final   Culture NO GROWTH 1 DAY  Final   Report Status 08/26/2014 FINAL  Final     Studies: Dg Chest Port 1 View  08/29/2014   CLINICAL DATA:  Patient with congestive heart failure.  EXAM: PORTABLE CHEST - 1 VIEW  COMPARISON:  Chest radiograph 08/25/2014  FINDINGS: Multi lead pacer apparatus overlies the left hemi thorax, leads are stable in position. Right upper extremity PICC line tip projects over the superior vena cava. Monitoring leads project over the patient. Stable cardiomegaly. No consolidative pulmonary opacities. No pleural effusion or pneumothorax. Regional skeleton is unremarkable.  IMPRESSION: No acute cardiopulmonary process.   Electronically Signed   By: Lovey Newcomer M.D.   On: 08/29/2014 12:14   Dg Abd Portable 1v  08/29/2014   CLINICAL DATA:  64 year old male with nausea and vomiting.  Patient is 3 days status post left femoral ORIF.  EXAM: PORTABLE ABDOMEN - 1 VIEW  COMPARISON:  CT abdomen/ pelvis 05/27/2014; pelvic radiograph including lower abdomen 08/26/2014  FINDINGS: Mild gaseous distension of the colon. No significant distension of small bowel. Colonic stool burden within normal limits. Incompletely imaged left femoral hardware. No acute osseous abnormality.  IMPRESSION: Mild gaseous distension of the ascending and transverse colon without evidence of obstruction or small bowel involvement.   Electronically Signed   By: Jacqulynn Cadet M.D.   On: 08/29/2014 15:04    Scheduled Meds: .  sodium chloride   Intravenous Once  . sodium chloride  10 mL/hr Intravenous Once  . ALPRAZolam  0.5 mg Oral QHS  . amiodarone  200 mg Oral Daily  . antiseptic oral rinse  7 mL Mouth Rinse BID  . atorvastatin  40 mg Oral q1800  . bisacodyl  5 mg Oral QHS  . DULoxetine  60 mg Oral BID  . hydrALAZINE  100 mg Oral TID  . insulin aspart  0-9 Units Subcutaneous TID AC & HS  . insulin glargine  15 Units Subcutaneous BID  . levothyroxine  75 mcg Oral QAC breakfast  . rivaroxaban  15 mg Oral Q supper  . sildenafil  80 mg Oral TID   Continuous Infusions: . sodium chloride 10 mL/hr at 08/29/14 0543  . milrinone 0.5 mcg/kg/min (08/29/14 1100)    Principal Problem:   Fracture, intertrochanteric, left femur Active Problems:   Essential hypertension, benign   Persistent atrial fibrillation   Automatic implantable cardioverter-defibrillator in situ   DM2 (diabetes mellitus, type 2)   OSA (obstructive sleep apnea), intolerant to CPAP/BIPAP   Ventricular tachycardia   Chronic systolic CHF (congestive heart failure)   Hip fracture   Left femoral shaft fracture    Time spent: 35 minutes   Kelvin Cellar  [facility] Pager [phone]. If 7PM-7AM, please contact night-coverage at [email or web address], password [facility] 08/29/2014, 4:33 PM  LOS: 4 days

## 2014-08-29 NOTE — Progress Notes (Signed)
Occupational Therapy Treatment Patient Details Name: Larry [name] MRN: [id number] DOB: 1950-04-08 Today's Date: 08/29/2014    History of present illness Larry [name] is a 64 y.o. male who complains of left hip pain after sustaining a mechanical fall at home. Pt with left intertrochanteric fx s/p IM nail. PMHx NICM, CAD, DDD, back pain, CVA, obesity   OT comments  Pt now oriented and following commands. Improvement noted in mobility, although pt somewhat limited by sleepiness as a result of pain medications. Wife is planning to clear space on first floor of home in the event pt progresses well enough for a home discharge. Pt has a lift chair and would benefit from a hospital bed and BSC if he returns home.  At this point, continue to recommend SNF and will continue to follow.  Educated pt and wife in importance of OOB activity.  Follow Up Recommendations  SNF;Supervision/Assistance - 24 hour    Equipment Recommendations  3 in 1 bedside comode;Hospital bed    Recommendations for Other Services      Precautions / Restrictions Precautions Precautions: Fall Restrictions Weight Bearing Restrictions: Yes LLE Weight Bearing: Weight bearing as tolerated       Mobility Bed Mobility Overal bed mobility: Needs Assistance Bed Mobility: Supine to Sit;Sit to Supine     Supine to sit: Min assist Sit to supine: Min assist   General bed mobility comments: assist for LEs and verbal cues for sequence  Transfers   Equipment used: Rolling walker (2 wheeled) Transfers: Sit to/from Stand Sit to Stand: Min assist         General transfer comment: Cues for hand placement and for technique    Balance Overall balance assessment: Needs assistance   Sitting balance-Leahy Scale: Good       Standing balance-Leahy Scale: Fair Standing balance comment: able to release walker with one hand to manage urinal                   ADL Overall ADL's : Needs assistance/impaired      Grooming: Wash/dry hands;Wash/dry face;Brushing hair;Sitting                   Toilet Transfer: Minimal Insurance claims handler Details (indicate cue type and reason): stood to use urinal Toileting- Clothing Manipulation and Hygiene: Minimal assistance;Sit to/from stand         General ADL Comments: Educated pt and wife in availability and use of AE, but did not practice with this visit.      Vision                     Perception     Praxis      Cognition   Behavior During Therapy: [facility] for tasks assessed/performed Overall Cognitive Status: Within Functional Limits for tasks assessed                       Extremity/Trunk Assessment               Exercises General Exercises - Lower Extremity Long Arc Quad: AROM;Seated;10 reps;AAROM;Right;Left (AAROM on left) Hip Flexion/Marching: AROM;Seated;Both;10 reps   Shoulder Instructions       General Comments      Pertinent Vitals/ Pain       Pain Assessment: Faces Faces Pain Scale: Hurts even more Pain Location: L hip with movement Pain Descriptors / Indicators: Sore Pain Intervention(s): Monitored during session;Premedicated before session;Repositioned  Home Living  Prior Functioning/Environment              Frequency Min 2X/week     Progress Toward Goals  OT Goals(current goals can now be found in the care plan section)  Progress towards OT goals: Progressing toward goals  Acute Rehab OT Goals Patient Stated Goal: return home able to care for himself  Plan Discharge plan remains appropriate    Co-evaluation                 End of Session Equipment Utilized During Treatment: Rolling walker   Activity Tolerance Patient limited by lethargy   Patient Left in bed;with call bell/phone within reach;with family/visitor present   Nurse Communication Mobility status        Time: 4128-7867 OT Time  Calculation (min): 29 min  Charges: OT General Charges $OT Visit: 1 Procedure OT Treatments $Self Care/Home Management : 23-37 mins  [name] 08/29/2014, 1:16 PM  [phone]

## 2014-08-29 NOTE — Progress Notes (Addendum)
  KUB results reviewed with Dr Haroldine Laws.    Place NG to decompress. Continue NPO. Give dulcolax suppository now.   Check KUB in am .  CLEGG,AMY NP-C  3:49 PM  Agree  Akif Weldy,MD 5:24 PM

## 2014-08-29 NOTE — Progress Notes (Signed)
Physical Therapy Treatment Patient Details Name: Larry [name] MRN: [id number] DOB: [birth year] Today's Date: 08/29/2014    History of Present Illness Larry [name] is a 64 y.o. male who complains of left hip pain after sustaining a mechanical fall at home. Pt with left intertrochanteric fx s/p IM nail. PMHx NICM, CAD, DDD, back pain, CVA, obesity    PT Comments    Pt markedly improved with cognition and mobility from evaluation. Pt continues to require O2 with activity as on RA dropping to 88% but maintaining 93% on 2L with activity. Pt educated for sequence of transfers and gait but will still need to demonstrate increased mobility for possibility of home but improving. Educated for HEP as well and encouraged to mobilize daily with staff.   Follow Up Recommendations  SNF;Supervision for mobility/OOB     Equipment Recommendations  Rolling walker with 5" wheels;3in1 (PT)    Recommendations for Other Services       Precautions / Restrictions Precautions Precautions: Fall Restrictions LLE Weight Bearing: Weight bearing as tolerated    Mobility  Bed Mobility Overal bed mobility: Needs Assistance Bed Mobility: Supine to Sit;Sit to Supine     Supine to sit: Min assist Sit to supine: Min assist   General bed mobility comments: cues for sequence with assist to bring legs fully off of and onto bed   Transfers Overall transfer level: Needs assistance   Transfers: Sit to/from Stand Sit to Stand: Min assist         General transfer comment: Cues for hand placement and for technique  Ambulation/Gait Ambulation/Gait assistance: Min guard Ambulation Distance (Feet): 28 Feet Assistive device: Rolling walker (2 wheeled) Gait Pattern/deviations: Step-through pattern;Decreased stride length;Trunk flexed   Gait velocity interpretation: Below normal speed for age/gender General Gait Details: cues for sequence and posture, decreased cues and assist today with increased distance  but limited by fatigue   Stairs            Wheelchair Mobility    Modified Rankin (Stroke Patients Only)       Balance Overall balance assessment: Needs assistance   Sitting balance-Leahy Scale: Good       Standing balance-Leahy Scale: Fair                      Cognition Arousal/Alertness: Awake/alert Behavior During Therapy: WFL for tasks assessed/performed Overall Cognitive Status: Within Functional Limits for tasks assessed                      Exercises General Exercises - Lower Extremity Long Arc Quad: AROM;Seated;10 reps;AAROM;Right;Left (AAROM on left) Hip Flexion/Marching: AROM;Seated;Both;10 reps    General Comments        Pertinent Vitals/Pain Pain Score: 6  Pain Location: left hip Pain Descriptors / Indicators: Aching Pain Intervention(s): Repositioned  HR 86-93    Home Living                      Prior Function            PT Goals (current goals can now be found in the care plan section) Progress towards PT goals: Progressing toward goals    Frequency  Min 3X/week    PT Plan Current plan remains appropriate    Co-evaluation             End of Session Equipment Utilized During Treatment: Gait belt;Oxygen Activity Tolerance: Patient limited by fatigue Patient left: in bed;with call  bell/phone within reach;with nursing/sitter in room;with family/visitor present     Time: 0851-0913 PT Time Calculation (min) (ACUTE ONLY): 22 min  Charges:  $Gait Training: 8-22 mins                    G Codes:      [name] 02-Sep-2014, 9:18 AM [name], [place]

## 2014-08-29 NOTE — Progress Notes (Signed)
NG tube attempted. Pt repeated "I can't do it, I can't do it." over and over. Pt became combative pushing the tube out of his nose. Pt then began vomiting. Suppository was given. Pt had large BM. Pt reports passing a lot of gas. Will continue to monitor closely.

## 2014-08-29 NOTE — Progress Notes (Signed)
     Subjective:  POD#3 IM nail of L femur. Patient reports pain as mild.  Resting comfortably in bed.  Patient had bump in creatinine yesterday.  Transferred to [facility] for better monitoring. Was able to work well with PT this morning.   Objective:   VITALS:   Filed Vitals:   08/29/14 1115 08/29/14 1200 08/29/14 1210 08/29/14 1300  BP: 120/54 135/55 135/55 125/50  Pulse: 87 88 87 88  Temp:   98.4 F (36.9 C)   TempSrc:   Oral   Resp: 17 8 19 15   Height:      Weight:      SpO2: 97% 95% 95% 94%    Neurologically intact ABD soft Neurovascular intact Sensation intact distally Intact pulses distally Dorsiflexion/Plantar flexion intact Incision: scant drainage   Lab Results  Component Value Date   WBC 6.8 08/29/2014   HGB 9.6* 08/29/2014   HCT 29.5* 08/29/2014   MCV 93.4 08/29/2014   PLT 240 08/29/2014   BMET    Component Value Date/Time   NA 128* 08/29/2014 0500   K 5.2* 08/29/2014 0500   CL 96* 08/29/2014 0500   CO2 23 08/29/2014 0500   GLUCOSE 183* 08/29/2014 0500   BUN 75* 08/29/2014 0500   CREATININE 3.32* 08/29/2014 0500   CALCIUM 8.9 08/29/2014 0500   GFRNONAA 18* 08/29/2014 0500   GFRAA 21* 08/29/2014 0500     Assessment/Plan: 3 Days Post-Op   Principal Problem:   Fracture, intertrochanteric, left femur Active Problems:   Essential hypertension, benign   Persistent atrial fibrillation   Automatic implantable cardioverter-defibrillator in situ   DM2 (diabetes mellitus, type 2)   OSA (obstructive sleep apnea), intolerant to CPAP/BIPAP   Ventricular tachycardia   Chronic systolic CHF (congestive heart failure)   Hip fracture   Left femoral shaft fracture   Up with therapy WBAT in the LLE Xarelto for DVT prophylaxis   Larry [name] Larry [name] 08/29/2014, 2:28 PM Cell [phone]

## 2014-08-30 ENCOUNTER — Inpatient Hospital Stay (HOSPITAL_COMMUNITY): Payer: Commercial Managed Care - HMO

## 2014-08-30 LAB — CBC
HEMATOCRIT: 29.4 % — AB (ref 39.0–52.0)
HEMOGLOBIN: 9.8 g/dL — AB (ref 13.0–17.0)
MCH: 31.1 pg (ref 26.0–34.0)
MCHC: 33.3 g/dL (ref 30.0–36.0)
MCV: 93.3 fL (ref 78.0–100.0)
Platelets: 271 10*3/uL (ref 150–400)
RBC: 3.15 MIL/uL — ABNORMAL LOW (ref 4.22–5.81)
RDW: 15.6 % — ABNORMAL HIGH (ref 11.5–15.5)
WBC: 6.8 10*3/uL (ref 4.0–10.5)

## 2014-08-30 LAB — CBC WITH DIFFERENTIAL/PLATELET
Basophils Absolute: 0 10*3/uL (ref 0.0–0.1)
Basophils Relative: 0 % (ref 0–1)
Eosinophils Absolute: 0.2 10*3/uL (ref 0.0–0.7)
Eosinophils Relative: 2 % (ref 0–5)
HEMATOCRIT: 28.5 % — AB (ref 39.0–52.0)
HEMOGLOBIN: 9.2 g/dL — AB (ref 13.0–17.0)
Lymphocytes Relative: 12 % (ref 12–46)
Lymphs Abs: 0.9 10*3/uL (ref 0.7–4.0)
MCH: 30.4 pg (ref 26.0–34.0)
MCHC: 32.3 g/dL (ref 30.0–36.0)
MCV: 94.1 fL (ref 78.0–100.0)
MONOS PCT: 13 % — AB (ref 3–12)
Monocytes Absolute: 1 10*3/uL (ref 0.1–1.0)
Neutro Abs: 5.4 10*3/uL (ref 1.7–7.7)
Neutrophils Relative %: 73 % (ref 43–77)
PLATELETS: 258 10*3/uL (ref 150–400)
RBC: 3.03 MIL/uL — AB (ref 4.22–5.81)
RDW: 15.8 % — ABNORMAL HIGH (ref 11.5–15.5)
SMEAR REVIEW: ADEQUATE
WBC: 7.5 10*3/uL (ref 4.0–10.5)

## 2014-08-30 LAB — BASIC METABOLIC PANEL
Anion gap: 13 (ref 5–15)
BUN: 79 mg/dL — AB (ref 6–20)
CO2: 20 mmol/L — AB (ref 22–32)
CREATININE: 4.07 mg/dL — AB (ref 0.61–1.24)
Calcium: 8.6 mg/dL — ABNORMAL LOW (ref 8.9–10.3)
Chloride: 91 mmol/L — ABNORMAL LOW (ref 101–111)
GFR calc Af Amer: 17 mL/min — ABNORMAL LOW (ref >60)
GFR calc non Af Amer: 14 mL/min — ABNORMAL LOW (ref >60)
GLUCOSE: 262 mg/dL — AB (ref 65–99)
Potassium: 4.9 mmol/L (ref 3.5–5.1)
SODIUM: 124 mmol/L — AB (ref 135–145)

## 2014-08-30 LAB — RENAL FUNCTION PANEL
ANION GAP: 14 (ref 5–15)
Albumin: 3 g/dL — ABNORMAL LOW (ref 3.5–5.0)
BUN: 89 mg/dL — ABNORMAL HIGH (ref 6–20)
CALCIUM: 8.5 mg/dL — AB (ref 8.9–10.3)
CHLORIDE: 90 mmol/L — AB (ref 101–111)
CO2: 20 mmol/L — ABNORMAL LOW (ref 22–32)
CREATININE: 5.11 mg/dL — AB (ref 0.61–1.24)
GFR calc non Af Amer: 11 mL/min — ABNORMAL LOW (ref >60)
GFR, EST AFRICAN AMERICAN: 13 mL/min — AB (ref >60)
Glucose, Bld: 275 mg/dL — ABNORMAL HIGH (ref 65–99)
POTASSIUM: 5.1 mmol/L (ref 3.5–5.1)
Phosphorus: 6.2 mg/dL — ABNORMAL HIGH (ref 2.5–4.6)
SODIUM: 124 mmol/L — AB (ref 135–145)

## 2014-08-30 LAB — GLUCOSE, CAPILLARY
Glucose-Capillary: 189 mg/dL — ABNORMAL HIGH (ref 65–99)
Glucose-Capillary: 165 mg/dL — ABNORMAL HIGH (ref 65–99)
Glucose-Capillary: 159 mg/dL — ABNORMAL HIGH (ref 65–99)
Glucose-Capillary: 191 mg/dL — ABNORMAL HIGH (ref 65–99)

## 2014-08-30 LAB — IRON AND TIBC
IRON: 36 ug/dL — AB (ref 45–182)
SATURATION RATIOS: 13 % — AB (ref 17.9–39.5)
TIBC: 273 ug/dL (ref 250–450)
UIBC: 237 ug/dL

## 2014-08-30 LAB — CARBOXYHEMOGLOBIN
Carboxyhemoglobin: 1.6 % — ABNORMAL HIGH (ref 0.5–1.5)
Methemoglobin: 0.9 % (ref 0.0–1.5)
O2 SAT: 82.1 %
Total hemoglobin: 10 g/dL — ABNORMAL LOW (ref 13.5–18.0)

## 2014-08-30 LAB — TRANSFERRIN: Transferrin: 195 mg/dL (ref 180–329)

## 2014-08-30 LAB — FERRITIN: Ferritin: 253 ng/mL (ref 24–336)

## 2014-08-30 MED ORDER — DOCUSATE SODIUM 100 MG PO CAPS
100.0000 mg | ORAL_CAPSULE | Freq: Two times a day (BID) | ORAL | Status: DC
  Administered 2014-08-30 – 2014-09-02 (×7): 100 mg via ORAL
  Filled 2014-08-30 (×10): qty 1

## 2014-08-30 MED ORDER — FUROSEMIDE 10 MG/ML IJ SOLN
160.0000 mg | Freq: Four times a day (QID) | INTRAVENOUS | Status: DC
  Administered 2014-08-30 – 2014-08-31 (×5): 160 mg via INTRAVENOUS
  Filled 2014-08-30 (×8): qty 16

## 2014-08-30 MED ORDER — POLYETHYLENE GLYCOL 3350 17 G PO PACK
17.0000 g | PACK | Freq: Every day | ORAL | Status: DC
  Administered 2014-08-30 – 2014-09-01 (×3): 17 g via ORAL
  Filled 2014-08-30 (×3): qty 1

## 2014-08-30 MED ORDER — HEPARIN (PORCINE) IN NACL 100-0.45 UNIT/ML-% IJ SOLN
700.0000 [IU]/h | INTRAMUSCULAR | Status: DC
Start: 2014-08-30 — End: 2014-09-02
  Administered 2014-08-30: 1300 [IU]/h via INTRAVENOUS
  Administered 2014-09-01: 850 [IU]/h via INTRAVENOUS
  Filled 2014-08-30 (×7): qty 250

## 2014-08-30 NOTE — Progress Notes (Signed)
[facility] PROGRESS NOTE  Larry [name] FYB:[id number] DOB: 1950/12/12 DOA: 08/25/2014 PCP: [name], MD  Assessment/Plan:  1. Severe nonischemic cardiomyopathy with chronic systolic heart failure with an EF of 25%. History of V. tach. Patient has AICD, on home milrinone drip -Patient having a 4 kg weight gain since 08/26/2014, his weight increasing to 106.6 kg from 102kg -Lab showing an upward trending creatinine to 4.07 from 2.67. His Lasix is being held given deterioration of kidney function -Remains on milrinone drip. -Co-oximetry 82.1%  -Heart failure team following, spoke with Dr Haroldine Laws of Cardiology yesterday  2.  Acute on chronic renal failure -Creatinine continues to trend up to 4.07 from 3.32 on this mornings labs. He appears have a baseline creatinine near 2.0 His Lasix has been held by cardiology given upward trend in his creatinine. Patient appears fluid overloaded on exam, having 4 kg weight gain, +3.1 L fluid balance -Suspect cardiorenal syndrome -Nephrology consulted, spoke with Dr Deterding  3. Nausea/vomiting -Patient having episode of nausea vomiting today, 500 cc of brown emesis noted yesterday -He was further worked up with KUB  -NG tube was placed as there is concern for ileus -Patient reporting significant improvement after having bowel movement. -Asking to advance his diet   4. Mechanical trip and fall with left femoral intertrochanteric fracture.  -status post intramedullary nail to left hip on 7/19 -WBAT -PT/OT evaluation -Plan for discharge to SNF when medically stable  4. Paroxysmal atrial fibrillation.  -Xarelto restarted  5. Hyponatremia.  -Lab showing sodium level trended down to 124 from 128. Likely secondary to volume overload  6. Obstructive sleep apnea.  -Continue daily at bedtime BiPAP  7. DM type II. recent A1c of 7.7 -continue Lantus and SSI -Monitor CBGs closely.   8.  Hypothyroidism and pituitary microadenoma.  No acute issues, continue home dose Synthroid.   Code Status: Full Family Communication: wife at bedside Disposition Plan: Patient was transferred to [facility] for closer monitoring   Consultants:  Ortho (Dr. Percell Miller)  HF team   Nephrology  Procedures:  S/P intramedullary nail on left hip on 7/18  Antibiotics:  None   HPI/Subjective: Patient denies worsening shortness of breath or chest pain. Reports improvement as nausea vomiting, currently nothing by mouth and is asking to advance his diet. Reports left foot pain  Objective: Filed Vitals:   08/30/14 0807  BP: 120/38  Pulse: 90  Temp: 98.3 F (36.8 C)  Resp: 16    Intake/Output Summary (Last 24 hours) at 08/30/14 0832 Last data filed at 08/29/14 2000  Gross per 24 hour  Intake  840.2 ml  Output    850 ml  Net   -9.8 ml   Filed Weights   08/28/14 0613 08/29/14 0532 08/30/14 0400  Weight: 105.098 kg (231 lb 11.2 oz) 105.235 kg (232 lb) 106.595 kg (235 lb)    Exam:   General:  He is awake and alert, oriented, follows commands now  Cardiovascular:norubs, no gallops, RRR  Respiratory: Has bibasilar crackles, no wheezing rhonchi, normal respiratory effort  Abdomen: soft, NT, ND, positive BS Musculoskeletal: Surgical incision site appears clean, no evidence of infection. Has 1-2+ bilateral lower extremity pitting edema Data Reviewed: Basic Metabolic Panel:  Recent Labs Lab 08/26/14 0605 08/27/14 0506 08/28/14 0536 08/29/14 0500 08/30/14 0410  NA 129* 127* 129* 128* 124*  K 4.6 4.7 5.1 5.2* 4.9  CL 95* 95* 95* 96* 91*  CO2 25 23 23 23  20*  GLUCOSE 172* 211* 159* 183* 262*  BUN 47* 51* 64* 75* 79*  CREATININE 2.29* 2.39* 2.67* 3.32* 4.07*  CALCIUM 8.6* 8.3* 8.6* 8.9 8.6*  MG 2.1  --   --   --   --    CBC:  Recent Labs Lab 08/24/14 0530 08/25/14 1458 08/26/14 0605 08/27/14 0805 08/29/14 0500 08/30/14 0410  WBC 9.3 8.5 7.5 6.9 6.8 6.8  NEUTROABS 7.2 6.8  --   --   --   --   HGB 10.8* 9.9*  8.8* 9.9* 9.6* 9.8*  HCT 33.3* 30.7* 27.5* 29.7* 29.5* 29.4*  MCV 98.8 95.6 94.8 92.8 93.4 93.3  PLT 285 241 209 184 240 271   BNP (last 3 results)  Recent Labs  05/22/14 1029 05/27/14 1126 06/19/14 1030  BNP 935.2* 1627.8* 738.9*    ProBNP (last 3 results)  Recent Labs  10/09/13 0926 11/14/13 0926 01/14/14 1112  PROBNP 1561.0* 1261.0* 5769.0*   CBG:  Recent Labs Lab 08/28/14 2134 08/29/14 0615 08/29/14 1210 08/29/14 1736 08/29/14 2117  GLUCAP 193* 173* 199* 197* 164*    Recent Results (from the past 240 hour(s))  MRSA PCR Screening     Status: None   Collection Time: 08/21/14  6:50 PM  Result Value Ref Range Status   MRSA by PCR NEGATIVE NEGATIVE Final    Comment:        The GeneXpert MRSA Assay (FDA approved for NASAL specimens only), is one component of a comprehensive MRSA colonization surveillance program. It is not intended to diagnose MRSA infection nor to guide or monitor treatment for MRSA infections.   Surgical pcr screen     Status: None   Collection Time: 08/21/14 10:38 PM  Result Value Ref Range Status   MRSA, PCR NEGATIVE NEGATIVE Final   Staphylococcus aureus NEGATIVE NEGATIVE Final    Comment:        The Xpert SA Assay (FDA approved for NASAL specimens in patients over 46 years of age), is one component of a comprehensive surveillance program.  Test performance has been validated by [facility] for patients greater than or equal to 33 year old. It is not intended to diagnose infection nor to guide or monitor treatment.   Urine culture     Status: None   Collection Time: 08/25/14  4:12 PM  Result Value Ref Range Status   Specimen Description URINE, CLEAN CATCH  Final   Special Requests NONE  Final   Culture NO GROWTH 1 DAY  Final   Report Status 08/26/2014 FINAL  Final     Studies: Dg Chest Port 1 View  08/29/2014   CLINICAL DATA:  Patient with congestive heart failure.  EXAM: PORTABLE CHEST - 1 VIEW  COMPARISON:  Chest  radiograph 08/25/2014  FINDINGS: Multi lead pacer apparatus overlies the left hemi thorax, leads are stable in position. Right upper extremity PICC line tip projects over the superior vena cava. Monitoring leads project over the patient. Stable cardiomegaly. No consolidative pulmonary opacities. No pleural effusion or pneumothorax. Regional skeleton is unremarkable.  IMPRESSION: No acute cardiopulmonary process.   Electronically Signed   By: Lovey Newcomer M.D.   On: 08/29/2014 12:14   Dg Abd Portable 1v  08/30/2014   CLINICAL DATA:  Abdominal pain.  EXAM: PORTABLE ABDOMEN - 1 VIEW  COMPARISON:  08/28/2016.  FINDINGS: Distended loops of small and large bowel are again noted consistent adynamic ileus. Stool is noted colon. No free air is identified. Continued follow-up abdominal exams demonstrate resolution suggested. No acute bony abnormality.  IMPRESSION:  Persistent dilated loops of small and large bowel noted suggesting adynamic ileus. Continued upper abdominal series suggested to demonstrate resolution.   Electronically Signed   By: Marcello Moores  Register   On: 08/30/2014 07:15   Dg Abd Portable 1v  08/29/2014   CLINICAL DATA:  64 year old male with nausea and vomiting. Patient is 3 days status post left femoral ORIF.  EXAM: PORTABLE ABDOMEN - 1 VIEW  COMPARISON:  CT abdomen/ pelvis 05/27/2014; pelvic radiograph including lower abdomen 08/26/2014  FINDINGS: Mild gaseous distension of the colon. No significant distension of small bowel. Colonic stool burden within normal limits. Incompletely imaged left femoral hardware. No acute osseous abnormality.  IMPRESSION: Mild gaseous distension of the ascending and transverse colon without evidence of obstruction or small bowel involvement.   Electronically Signed   By: Jacqulynn Cadet M.D.   On: 08/29/2014 15:04    Scheduled Meds: . sodium chloride   Intravenous Once  . sodium chloride  10 mL/hr Intravenous Once  . ALPRAZolam  0.5 mg Oral QHS  . amiodarone  200 mg  Oral Daily  . antiseptic oral rinse  7 mL Mouth Rinse BID  . atorvastatin  40 mg Oral q1800  . bisacodyl  5 mg Oral QHS  . docusate sodium  100 mg Oral BID  . DULoxetine  60 mg Oral BID  . hydrALAZINE  100 mg Oral TID  . insulin aspart  0-9 Units Subcutaneous TID AC & HS  . insulin glargine  15 Units Subcutaneous BID  . levothyroxine  75 mcg Oral QAC breakfast  . polyethylene glycol  17 g Oral Daily  . rivaroxaban  15 mg Oral Q supper  . sildenafil  80 mg Oral TID   Continuous Infusions: . sodium chloride 10 mL/hr at 08/29/14 0543  . milrinone 0.5 mcg/kg/min (08/30/14 0730)    Principal Problem:   Fracture, intertrochanteric, left femur Active Problems:   Essential hypertension, benign   Persistent atrial fibrillation   Automatic implantable cardioverter-defibrillator in situ   DM2 (diabetes mellitus, type 2)   OSA (obstructive sleep apnea), intolerant to CPAP/BIPAP   Ventricular tachycardia   Chronic systolic CHF (congestive heart failure)   Hip fracture   Left femoral shaft fracture   Acute on chronic renal failure    Time spent: 35 minutes   Kelvin Cellar  [facility] Pager [phone]. If 7PM-7AM, please contact night-coverage at [email or web address], password [facility] 08/30/2014, 8:32 AM  LOS: 5 days

## 2014-08-30 NOTE — Progress Notes (Signed)
Advanced Heart Failure Rounding Note   Subjective:    Larry [name] is a 64 y/o man with history of chronic systolic CHF (nonischemic cardiomyopathy) s/p 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 He is on chronic milrinone 0.5 mcg.  Prior to admit he was being considered for LVAD.   Dischaged 7/16 after dental procedures. Also had RHC with elevate pulmonary pressures. Revatio was increased to 80 mg tid during that stay.  Admitted with fall---> broken L  hip . 7/18 had IM Nail L femur.   7/21 Had 500 cc of dark brown emesis yesterday with hypoactive bowel sounds and no constipation. KUB showed dilated colon with normal stool burden. Attempted NG to decompress.  Pt refused NG tube and became agitated during procedure. He had further vomiting. Was given a dulcolax suppository with large BM and felt better with lots of flatulence.     Continue with progressive renal failure. Poor urine output. ? Mild lethargy.   Coox 82, CVP 17    Objective:   Weight Range:  Vital Signs:   Temp:  [97.5 F (36.4 C)-98.4 F (36.9 C)] 97.5 F (36.4 C) (07/22 0403) Pulse Rate:  [86-92] 88 (07/22 0500) Resp:  [8-21] 17 (07/22 0500) BP: (81-140)/(31-56) 118/38 mmHg (07/22 0403) SpO2:  [91 %-100 %] 95 % (07/22 0500) Weight:  [235 lb (106.595 kg)] 235 lb (106.595 kg) (07/22 0400) Last BM Date: 08/29/14  Weight change: Filed Weights   08/28/14 0613 08/29/14 0532 08/30/14 0400  Weight: 231 lb 11.2 oz (105.098 kg) 232 lb (105.235 kg) 235 lb (106.595 kg)    Intake/Output:   Intake/Output Summary (Last 24 hours) at 08/30/14 0759 Last data filed at 08/29/14 2000  Gross per 24 hour  Intake  840.2 ml  Output    850 ml  Net   -9.8 ml     Physical Exam: General:  In bed. Wife at bed side.  HEENT: normal Neck: supple. JVP jaw. Carotids 2+ bilat; no bruits. No lymphadenopathy or thryomegaly appreciated. Cor: PMI  nondisplaced. Regular rate & rhythm. No rubs, gallops or murmurs. Lungs: Diminished both bases. Slight crackles L base. Abdomen: soft, nontender, nondistended. No hepatosplenomegaly. No bruits or masses. Good bowel sounds. Extremities: no cyanosis, clubbing, rash. RUE PICC dual lumen . R Thigh lateral aspect dressing intact x2. 2 + edema in ankles.  1+ edema to knees bilaterally. Neuro: Pleasantly confused. Cranial nerves grossly intact. Affect pleasant. Moves extremities without difficulty. L leg tender.  Telemetry: NSR 80s. Long PR. PVCs including several episodes of trigeminy.  Labs: Basic Metabolic Panel:  Recent Labs Lab 08/26/14 0605 08/27/14 0506 08/28/14 0536 08/29/14 0500 08/30/14 0410  NA 129* 127* 129* 128* 124*  K 4.6 4.7 5.1 5.2* 4.9  CL 95* 95* 95* 96* 91*  CO2 25 23 23 23  20*  GLUCOSE 172* 211* 159* 183* 262*  BUN 47* 51* 64* 75* 79*  CREATININE 2.29* 2.39* 2.67* 3.32* 4.07*  CALCIUM 8.6* 8.3* 8.6* 8.9 8.6*  MG 2.1  --   --   --   --     Liver Function Tests: No results for input(s): AST, ALT, ALKPHOS, BILITOT, PROT, ALBUMIN in the last 168 hours. No results for input(s): LIPASE, AMYLASE in the last 168 hours. No results for input(s): AMMONIA in the last 168 hours.  CBC:  Recent Labs Lab 08/24/14 0530 08/25/14 1458 08/26/14 0605 08/27/14 0805 08/29/14 0500 08/30/14 0410  WBC  9.3 8.5 7.5 6.9 6.8 6.8  NEUTROABS 7.2 6.8  --   --   --   --   HGB 10.8* 9.9* 8.8* 9.9* 9.6* 9.8*  HCT 33.3* 30.7* 27.5* 29.7* 29.5* 29.4*  MCV 98.8 95.6 94.8 92.8 93.4 93.3  PLT 285 241 209 184 240 271    Cardiac Enzymes: No results for input(s): CKTOTAL, CKMB, CKMBINDEX, TROPONINI in the last 168 hours.  BNP: BNP (last 3 results)  Recent Labs  05/22/14 1029 05/27/14 1126 06/19/14 1030  BNP 935.2* 1627.8* 738.9*    ProBNP (last 3 results)  Recent Labs  10/09/13 0926 11/14/13 0926 01/14/14 1112  PROBNP 1561.0* 1261.0* 5769.0*      Other  results:  Imaging: Dg Chest Port 1 View  08/29/2014   CLINICAL DATA:  Patient with congestive heart failure.  EXAM: PORTABLE CHEST - 1 VIEW  COMPARISON:  Chest radiograph 08/25/2014  FINDINGS: Multi lead pacer apparatus overlies the left hemi thorax, leads are stable in position. Right upper extremity PICC line tip projects over the superior vena cava. Monitoring leads project over the patient. Stable cardiomegaly. No consolidative pulmonary opacities. No pleural effusion or pneumothorax. Regional skeleton is unremarkable.  IMPRESSION: No acute cardiopulmonary process.   Electronically Signed   By: Lovey Newcomer M.D.   On: 08/29/2014 12:14   Dg Abd Portable 1v  08/30/2014   CLINICAL DATA:  Abdominal pain.  EXAM: PORTABLE ABDOMEN - 1 VIEW  COMPARISON:  08/28/2016.  FINDINGS: Distended loops of small and large bowel are again noted consistent adynamic ileus. Stool is noted colon. No free air is identified. Continued follow-up abdominal exams demonstrate resolution suggested. No acute bony abnormality.  IMPRESSION: Persistent dilated loops of small and large bowel noted suggesting adynamic ileus. Continued upper abdominal series suggested to demonstrate resolution.   Electronically Signed   By: Marcello Moores  Register   On: 08/30/2014 07:15   Dg Abd Portable 1v  08/29/2014   CLINICAL DATA:  64 year old male with nausea and vomiting. Patient is 3 days status post left femoral ORIF.  EXAM: PORTABLE ABDOMEN - 1 VIEW  COMPARISON:  CT abdomen/ pelvis 05/27/2014; pelvic radiograph including lower abdomen 08/26/2014  FINDINGS: Mild gaseous distension of the colon. No significant distension of small bowel. Colonic stool burden within normal limits. Incompletely imaged left femoral hardware. No acute osseous abnormality.  IMPRESSION: Mild gaseous distension of the ascending and transverse colon without evidence of obstruction or small bowel involvement.   Electronically Signed   By: Jacqulynn Cadet M.D.   On: 08/29/2014  15:04     Medications:     Scheduled Medications: . sodium chloride   Intravenous Once  . sodium chloride  10 mL/hr Intravenous Once  . ALPRAZolam  0.5 mg Oral QHS  . amiodarone  200 mg Oral Daily  . antiseptic oral rinse  7 mL Mouth Rinse BID  . atorvastatin  40 mg Oral q1800  . bisacodyl  5 mg Oral QHS  . DULoxetine  60 mg Oral BID  . hydrALAZINE  100 mg Oral TID  . insulin aspart  0-9 Units Subcutaneous TID AC & HS  . insulin glargine  15 Units Subcutaneous BID  . levothyroxine  75 mcg Oral QAC breakfast  . rivaroxaban  15 mg Oral Q supper  . sildenafil  80 mg Oral TID    Infusions: . sodium chloride 10 mL/hr at 08/29/14 0543  . milrinone 0.5 mcg/kg/min (08/30/14 0730)    PRN Medications: albuterol, ALPRAZolam **AND** ALPRAZolam, morphine injection,  ondansetron (ZOFRAN) IV, oxyCODONE-acetaminophen **AND** oxyCODONE, polyethylene glycol, sodium chloride, zolpidem   Assessment:  1. Left Hip Fracture  2. Chronic systolic CHF- on home milrinone 0.67mcg 3. Acute on chronic renal failure - suspect post-op ATN  4.  Paroxysmal atrial fibrillation requiring DCCV 02/2014, 04/2014- on chronic amio and xarelto. CHADS VASc Score- 5 5. Diabetes mellitus type 2 6. Depression/anxiety 7. OSA, complex- Needs Bipap at night but had difficulty after dental procedure.  8 Pulmonary HTN - Mixed pulmonary venous hypertension and PAH- on  9. Dental extractions 08/2014 by Dr Enrique Sack 10. Obesity Body mass index is 32.39 kg/(m^2). 11. Probable chronic respiratory failure - requiring home O2 with ambulation as of this admission 12. Hyponatremia - NA 124   Plan/Discussion:     Post op Day # 4 (08/26/14) --> IM Nail L femur. On  Xarelto per ortho.    Continue milrinone 0.5 mcg. Continue to hold dig and lasix elevated creatinine. Follow BMETs.  With continued worsening of Cr, likely need Neph consult.  Cr 2.39 -> 2.67 -> 3.32 -> 4.07  K slightly improved at 4.9.  Continue revatio 80 mg tid.  Refused Bipap again last night, said he didn't feel like it after having been sick all day. Encouraged to try tonight.  Sinus Rhythm with PVCs. Long PR interval noted on EKG . Continue amio. Off Metoprolol.   Length of Stay: 5   Larry Friar PA-C  08/30/2014, 7:59 AM  Advanced Heart Failure Team Pager [phone] (M-F; 7a - 4p)  Please contact [facility] for night-coverage after hours (4p -7a ) and weekends on [email or web address]  Patient seen and examined with Larry Kilts, PA-C. We discussed all aspects of the encounter. I agree with the assessment and plan as stated above.   He has progressive renal failure likely due to post-op ATN. Renal now following. May need CRRT tomorrow. CVP up but breathing ok. Continue milrinone. Ileus seems to be improving. Limit pain meds.   Larry Stoklosa,MD 3:23 PM

## 2014-08-30 NOTE — Progress Notes (Addendum)
ANTICOAGULATION CONSULT NOTE - Initial Consult  Pharmacy Consult for heparin  Indication: atrial fibrillation  Allergies  Allergen Reactions  . Bydureon [Exenatide] Other (See Comments)    sweating  . Losartan Potassium Other (See Comments)    insomnia    Patient Measurements: Height: 5\' 10"  (177.8 cm) Weight: 235 lb (106.595 kg) IBW/kg (Calculated) : 73 Heparin Dosing Weight: 96 kg  Vital Signs: Temp: 98.2 F (36.8 C) (07/22 1222) Temp Source: Oral (07/22 1222) BP: 104/36 mmHg (07/22 1222) Pulse Rate: 88 (07/22 1222)  Labs:  Recent Labs  08/28/14 0536 08/29/14 0500 08/30/14 0410  HGB  --  9.6* 9.8*  HCT  --  29.5* 29.4*  PLT  --  240 271  CREATININE 2.67* 3.32* 4.07*    Estimated Creatinine Clearance: 23 mL/min (by C-G formula based on Cr of 4.07).   Medical History: Past Medical History  Diagnosis Date  . NICM (nonischemic cardiomyopathy)     a. NICM. EF 2011 25-30%. b. LHC 2009 nonobstructive CAD. c. h/o [employer] ICD 2012. d. Echo 04/2013 - EF 25-30% with mildly decreased RV systolic function. e. Echo 01/2014: EF 20-25%, inf akinesis, RHC. e. Home milrinone started 4/16.  [name] CAD (coronary artery disease)     a. nonobstructive CAD by cath 2009 & 06/2014.  . Obesity   . CVA (cerebral vascular accident)     CVA 2007 without residual deficit  . 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 Depression   . Erectile dysfunction   . DDD (degenerative disc disease)     Chronic low back pain.   . OSA (obstructive sleep apnea)     a. 4/16 sleep study with severe OSA  . Monoclonal gammopathy   . CKD (chronic kidney disease), stage III   . 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 Hypogonadotropic hypogonadism   . Polycythemia, secondary     improved/resolved  . Hypercholesterolemia   . Back pain     F/B [name]  . Monoclonal gammopathy of unknown significance    per [name]  . Neck pain     F/B [name]  . PAF (paroxysmal atrial fibrillation) 06/04/10    a. On Xarelto. b. DCCV 02/2014, 04/2014 to NSR.  [name] History of diverticulitis of colon   . Type II or unspecified type diabetes mellitus with neurological manifestations, uncontrolled   . Nonproliferative diabetic retinopathy NOS(362.03)   . Ventricular tachycardia 10/25/13    appropriate ICD shock, VT CL 230-240 msec  . Confusion 02/01/2014  . Hypertension   . AICD (automatic cardioverter/defibrillator) present   . Anxiety   . Peripheral vascular disease     2007  . Hypothyroidism   . PTSD (post-traumatic stress disorder)     a. Anxiety/PTSD from ICD shock  . Lupus anticoagulant positive     a. No h/o DVT. Saw Dr Lindi Adie with hematology.   . Pulmonary hypertension     a. Mixed pulmonary venous hypertension and PAH  . Chronic respiratory failure     a. As of 08/2014 requiring home O2 with ambulation due to desat.    Medications:  Prescriptions prior to admission  Medication Sig Dispense Refill Last Dose  . ALPRAZolam (XANAX) 0.5 MG tablet Take 0.5 mg by mouth See admin instructions. Take 1 tablet (0.5 mg) every night, may take an additional tablet two more times during the day as needed for anxiety  08/24/2014 at Unknown time  . amiodarone (PACERONE) 200 MG tablet Take 1 tablet (200 mg total) by mouth daily. 30 tablet 3 prior to admission 08/21/14  . atorvastatin (LIPITOR) 40 MG tablet TAKE 1 TABLET DAILY 90 tablet 3 prior to admission 08/21/14  . bisacodyl (DULCOLAX) 5 MG EC tablet Take 5 mg by mouth at bedtime.   prior to admission 08/21/14  . chlorhexidine (PERIDEX) 0.12 % solution Perform mouth rinses twice daily after breakfast and at bedtime. (Patient taking differently: Use as directed in the mouth or throat 2 (two) times daily. Perform mouth rinses twice daily after breakfast and at bedtime (swish and spit)) 120 mL 0 08/25/2014 at am  . digoxin (LANOXIN) 0.125 MG tablet Take 0.5  tablets (0.0625 mg total) by mouth daily. 90 tablet 3 prior to admission 08/21/14  . DULoxetine (CYMBALTA) 30 MG capsule Take 60 mg by mouth 2 (two) times daily.    prior to admission 08/21/14  . GLUCAGON EMERGENCY 1 MG injection Inject 1 mg as directed once as needed (hypotension).    not yet needed  . hydrALAZINE (APRESOLINE) 100 MG tablet Take 1 tablet (100 mg total) by mouth 3 (three) times daily. 90 tablet 1 prior to admission 08/21/14  . insulin glargine (LANTUS) 100 unit/mL SOPN Inject 30 Units into the skin at bedtime.   prior to admission 08/21/14  . insulin lispro (HUMALOG KWIKPEN) 100 UNIT/ML KiwkPen Inject 30 Units into the skin 3 (three) times daily before meals.   prior to admission 08/21/14  . levothyroxine (SYNTHROID, LEVOTHROID) 75 MCG tablet TAKE 1 TABLET ON AN EMPTY STOMACH 30 MINUTES BEFORE BREAKFAST  5 prior to admission 08/21/14  . Melatonin 10 MG TABS Take 10 mg by mouth at bedtime as needed (sleep).    week ago  . metoprolol succinate (TOPROL-XL) 25 MG 24 hr tablet Take 1 tablet (25 mg total) by mouth 2 (two) times daily before a meal. 60 tablet 6 prior to admission 08/21/14 at unknown time  . milrinone (PRIMACOR) 20 MG/100ML SOLN infusion Inject 51.2 mcg/min into the vein continuous. (0.91mcg/kg/min)   08/25/2014 at continuous  . Multiple Vitamin (MULITIVITAMIN WITH MINERALS) TABS Take 1 tablet by mouth daily.   prior to admission 08/21/14  . oxyCODONE-acetaminophen (PERCOCET) 10-325 MG per tablet Take 1 tablet by mouth every 4 (four) hours as needed for pain.   0 08/25/2014 at 600  . Potassium Chloride ER 20 MEQ TBCR Take 20 mEq by mouth 2 (two) times daily. 60 tablet 0 prior to admission 08/21/14  . rivaroxaban (XARELTO) 15 MG TABS tablet Take 1 tablet (15 mg total) by mouth daily with supper. 30 tablet 3 prior to admission 08/21/14  . sildenafil (REVATIO) 20 MG tablet Take 4 tablets (80 mg total) by mouth 3 (three) times daily. 360 tablet 1 prior to admission 08/21/14  . spironolactone  (ALDACTONE) 25 MG tablet TAKE 1 TABLET DAILY 90 tablet 2 prior to admission 08/21/14  . torsemide (DEMADEX) 20 MG tablet Take 3 tablets (60 mg total) by mouth 2 (two) times daily. 180 tablet 1 prior to admission 08/21/14  . zolpidem (AMBIEN) 5 MG tablet Take 1 tablet (5 mg total) by mouth at bedtime as needed for sleep. 30 tablet 0 08/24/2014 at Unknown time  . oxyCODONE-acetaminophen (PERCOCET/ROXICET) 5-325 MG per tablet Take 1-2 tablets by mouth every 4-6 hours as needed for moderate-severe pain. (Patient not taking: Reported on 08/25/2014) 40 tablet 0 Not Taking at Unknown time    Assessment: 64 yo  male with history of pAFib on Xarelto. Xarelto will be held due to AKI, admitted with SCr 1.8, up to 4. CBC stable, hgb mildly low. Last dose of Xarelto was 1730 yesterday.  Goal of Therapy:  Heparin level 0.3-0.7 units/ml Monitor platelets by anticoagulation protocol: Yes   Plan:  Heparin 1300 units/hr Daily HL, CBC Monitor s/sx bleeding and renal fx Check level tonight     Hughes Better, PharmD, BCPS Clinical Pharmacist Pager: [phone] 08/30/2014 4:01 PM

## 2014-08-30 NOTE — Progress Notes (Signed)
     Subjective:  POD#4 IM nail of the L hip. Patient reports pain as mild to moderate.  Resting comfortably in bed.  NG inserted yesterday after 500cc dark brown emesis.   Suppository was given last night and was finally able to have BM.  No BM today yet.  Objective:   VITALS:   Filed Vitals:   08/30/14 0807 08/30/14 1200 08/30/14 1222 08/30/14 1610  BP: 120/38  104/36 97/28  Pulse: 90 87 88 87  Temp: 98.3 F (36.8 C)  98.2 F (36.8 C) 97.8 F (36.6 C)  TempSrc: Oral  Oral Oral  Resp: 16 18 18 21   Height:      Weight:      SpO2: 97% 90% 90% 94%    Neurologically intact ABD soft Neurovascular intact Sensation intact distally Intact pulses distally Dorsiflexion/Plantar flexion intact Incision: scant drainage   Lab Results  Component Value Date   WBC 6.8 08/30/2014   HGB 9.8* 08/30/2014   HCT 29.4* 08/30/2014   MCV 93.3 08/30/2014   PLT 271 08/30/2014   BMET    Component Value Date/Time   NA 124* 08/30/2014 0410   K 4.9 08/30/2014 0410   CL 91* 08/30/2014 0410   CO2 20* 08/30/2014 0410   GLUCOSE 262* 08/30/2014 0410   BUN 79* 08/30/2014 0410   CREATININE 4.07* 08/30/2014 0410   CALCIUM 8.6* 08/30/2014 0410   GFRNONAA 14* 08/30/2014 0410   GFRAA 17* 08/30/2014 0410     Assessment/Plan: 4 Days Post-Op   Principal Problem:   Fracture, intertrochanteric, left femur Active Problems:   Essential hypertension, benign   Persistent atrial fibrillation   Automatic implantable cardioverter-defibrillator in situ   DM2 (diabetes mellitus, type 2)   OSA (obstructive sleep apnea), intolerant to CPAP/BIPAP   Ventricular tachycardia   Chronic systolic CHF (congestive heart failure)   Hip fracture   Left femoral shaft fracture   Acute on chronic renal failure   Up with therapy WBAT in the LLE Xarelto for DVT prophylaxis Stable from ortho standpoint.  We will sign off today. We will continue to follow the patient in our outpatient clinic.  Please call with  any questions or concerns.    Zykiria Bruening Lelan Pons 08/30/2014, 4:13 PM Cell [phone]

## 2014-08-30 NOTE — Consult Note (Signed)
Reason for Consult: AKI Referring Physician: Dr. Coralyn Pear  HPI: [name] is a 64 yo male with PMHx of CKD stage III with baseline creatinine around 1.9, GFR 30-40, NICM, severe chronic systolic CHF with EF 79% on home milronone gtt, h/o Vtach with dual pacemaker with AICD, PAF on Xarelto, T2DM on insulin, h/o lupus anticoagulant positive, and hypothyroidism who presented to the ED on 08/25/14 after a fall at home and was found to have a left femoral intertrochanteric fracture. Patient underwent intramedullary nail placement on 7/19.   Patient has developed acute on chronic renal failure during admission with creatinine trending 1.8>2.46>2.29>2.39>2.67>3.32>4.07 in the setting of two recent surgeries (dental surgery and medullary nail placement), nausea/vomiting, and diuresis for heart failure. Nephrology has been consulted for assistance. Patient did receive cefazolin on 7/19 pre-op. Patient has not received any contrast.   Patient was seen and examined. Patient complains of fatigue, nausea, mild shortness of breath, and difficultly voiding. He denies any confusion or itching. Patient follows with [name] with [facility] for his CKD.    Past Medical History  Diagnosis Date  . NICM (nonischemic cardiomyopathy)     a. NICM. EF 2011 25-30%. b. LHC 2009 nonobstructive CAD. c. h/o [employer] Jude ICD 2012. d. Echo 04/2013 - EF 25-30% with mildly decreased RV systolic function. e. Echo 01/2014: EF 20-25%, inf akinesis, RHC. e. Home milrinone started 4/16.  [name] CAD (coronary artery disease)     a. nonobstructive CAD by cath 2009 & 06/2014.  . Obesity   . CVA (cerebral vascular accident)     CVA 2007 without residual deficit  . Pituitary tumor      (nonfunctionging pituitary microadenoma) s/p gamma knife surgery at [facility] 2009 with neuropathy and retinopathy  . Depression   . Erectile dysfunction   . DDD (degenerative disc disease)     Chronic low back pain.   . OSA (obstructive sleep apnea)     a.  4/16 sleep study with severe OSA  . Monoclonal gammopathy   . CKD (chronic kidney disease), stage III   . 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 Hypogonadotropic hypogonadism   . Polycythemia, secondary     improved/resolved  . Hypercholesterolemia   . Back pain     F/B [name]  . Monoclonal gammopathy of unknown significance     per [name]  . Neck pain     F/B [name]  . PAF (paroxysmal atrial fibrillation) 06/04/10    a. On Xarelto. b. DCCV 02/2014, 04/2014 to NSR.  [name] History of diverticulitis of colon   . Type II or unspecified type diabetes mellitus with neurological manifestations, uncontrolled   . Nonproliferative diabetic retinopathy NOS(362.03)   . Ventricular tachycardia 10/25/13    appropriate ICD shock, VT CL 230-240 msec  . Confusion 02/01/2014  . Hypertension   . AICD (automatic cardioverter/defibrillator) present   . Anxiety   . Peripheral vascular disease     2007  . Hypothyroidism   . PTSD (post-traumatic stress disorder)     a. Anxiety/PTSD from ICD shock  . Lupus anticoagulant positive     a. No h/o DVT. Saw Dr Lindi Adie with hematology.   . Pulmonary hypertension     a. Mixed pulmonary venous hypertension and PAH  . Chronic respiratory failure     a. As of 08/2014 requiring home O2 with ambulation due to desat.    Past Surgical History  Procedure Laterality Date  . Gamma knife surgery for pituitary tumor    .  Tonsillectomy    . Cardiac defibrillator placement  02/19/10    By JA.   . Right heart catheterization N/A 09/17/2013    Procedure: RIGHT HEART CATH;  Surgeon: [name], MD;  Location: [facility] CATH LAB;  Service: Cardiovascular;  Laterality: N/A;  . Cardiac catheterization    . Brain surgery    . Insert / replace / remove pacemaker    . Cardioversion N/A 02/14/2014    Procedure: CARDIOVERSION;  Surgeon: [name], MD;  Location: [place];  Service: Cardiovascular;  Laterality: N/A;  . Cardioversion N/A 05/02/2014    Procedure:  CARDIOVERSION;  Surgeon: [name], MD;  Location: [facility] ENDOSCOPY;  Service: Cardiovascular;  Laterality: N/A;  . Right heart catheterization N/A 05/08/2014    Procedure: RIGHT HEART CATH;  Surgeon: [name], MD;  Location: [facility] CATH LAB;  Service: Cardiovascular;  Laterality: N/A;  . Cardiac catheterization N/A 06/26/2014    Procedure: Right/Left Heart Cath And Coronary Angiography;  Surgeon: [name], MD;  Location: [place] CV LAB;  Service: Cardiovascular;  Laterality: N/A;  . Cardiac catheterization N/A 08/21/2014    Procedure: Right Heart Cath;  Surgeon: [name], MD;  Location: [place] CV LAB;  Service: Cardiovascular;  Laterality: N/A;  . Multiple extractions with alveoloplasty N/A 08/22/2014    Procedure: Extraction of toothy #'s 3,5,6,7,9,10,11,12,13,14,20,21,22,23,27,28,29,30 with alveoloplasty and bilateral mandibular tori reductions;  Surgeon: Lenn Cal, DDS;  Location: [place];  Service: Oral Surgery;  Laterality: N/A;  . Cataract extraction    . Femur im nail Left 08/26/2014    Procedure: INTRAMEDULLARY (IM) NAIL FEMORAL;  Surgeon: [name], MD;  Location: [place];  Service: Orthopedics;  Laterality: Left;    Family History  Problem Relation Age of Onset  . Lung cancer    . Cancer Mother     skin  . Dementia Mother   . Depression Mother   . Heart disease Father 46  . Hypertension Father   . Lung cancer Father   . Obesity Father   . Obesity Sister   . Hypertension Sister   . Diabetes Brother   . Hypertension Brother   . Heart disease Brother 1    Died of stroke MI age 69  . Obesity Brother   . Lung cancer Paternal Uncle   . Diabetes Paternal Uncle     requiring leg amputations    Social History:  reports that he quit smoking about 31 years ago. His smoking use included Cigars. He has never used smokeless tobacco. He reports that he does not drink alcohol or use illicit drugs.  Allergies:  Allergies  Allergen Reactions  .  Bydureon [Exenatide] Other (See Comments)    sweating  . Losartan Potassium Other (See Comments)    insomnia   Medications:  I have reviewed the patient's current medications. Prior to Admission:  Prescriptions prior to admission  Medication Sig Dispense Refill Last Dose  . ALPRAZolam (XANAX) 0.5 MG tablet Take 0.5 mg by mouth See admin instructions. Take 1 tablet (0.5 mg) every night, may take an additional tablet two more times during the day as needed for anxiety   08/24/2014 at Unknown time  . amiodarone (PACERONE) 200 MG tablet Take 1 tablet (200 mg total) by mouth daily. 30 tablet 3 prior to admission 08/21/14  . atorvastatin (LIPITOR) 40 MG tablet TAKE 1 TABLET DAILY 90 tablet 3 prior to admission 08/21/14  . bisacodyl (DULCOLAX) 5 MG EC tablet Take  5 mg by mouth at bedtime.   prior to admission 08/21/14  . chlorhexidine (PERIDEX) 0.12 % solution Perform mouth rinses twice daily after breakfast and at bedtime. (Patient taking differently: Use as directed in the mouth or throat 2 (two) times daily. Perform mouth rinses twice daily after breakfast and at bedtime (swish and spit)) 120 mL 0 08/25/2014 at am  . digoxin (LANOXIN) 0.125 MG tablet Take 0.5 tablets (0.0625 mg total) by mouth daily. 90 tablet 3 prior to admission 08/21/14  . DULoxetine (CYMBALTA) 30 MG capsule Take 60 mg by mouth 2 (two) times daily.    prior to admission 08/21/14  . GLUCAGON EMERGENCY 1 MG injection Inject 1 mg as directed once as needed (hypotension).    not yet needed  . hydrALAZINE (APRESOLINE) 100 MG tablet Take 1 tablet (100 mg total) by mouth 3 (three) times daily. 90 tablet 1 prior to admission 08/21/14  . insulin glargine (LANTUS) 100 unit/mL SOPN Inject 30 Units into the skin at bedtime.   prior to admission 08/21/14  . insulin lispro (HUMALOG KWIKPEN) 100 UNIT/ML KiwkPen Inject 30 Units into the skin 3 (three) times daily before meals.   prior to admission 08/21/14  . levothyroxine (SYNTHROID, LEVOTHROID) 75 MCG  tablet TAKE 1 TABLET ON AN EMPTY STOMACH 30 MINUTES BEFORE BREAKFAST  5 prior to admission 08/21/14  . Melatonin 10 MG TABS Take 10 mg by mouth at bedtime as needed (sleep).    week ago  . metoprolol succinate (TOPROL-XL) 25 MG 24 hr tablet Take 1 tablet (25 mg total) by mouth 2 (two) times daily before a meal. 60 tablet 6 prior to admission 08/21/14 at unknown time  . milrinone (PRIMACOR) 20 MG/100ML SOLN infusion Inject 51.2 mcg/min into the vein continuous. (0.28mcg/kg/min)   08/25/2014 at continuous  . Multiple Vitamin (MULITIVITAMIN WITH MINERALS) TABS Take 1 tablet by mouth daily.   prior to admission 08/21/14  . oxyCODONE-acetaminophen (PERCOCET) 10-325 MG per tablet Take 1 tablet by mouth every 4 (four) hours as needed for pain.   0 08/25/2014 at 600  . Potassium Chloride ER 20 MEQ TBCR Take 20 mEq by mouth 2 (two) times daily. 60 tablet 0 prior to admission 08/21/14  . rivaroxaban (XARELTO) 15 MG TABS tablet Take 1 tablet (15 mg total) by mouth daily with supper. 30 tablet 3 prior to admission 08/21/14  . sildenafil (REVATIO) 20 MG tablet Take 4 tablets (80 mg total) by mouth 3 (three) times daily. 360 tablet 1 prior to admission 08/21/14  . spironolactone (ALDACTONE) 25 MG tablet TAKE 1 TABLET DAILY 90 tablet 2 prior to admission 08/21/14  . torsemide (DEMADEX) 20 MG tablet Take 3 tablets (60 mg total) by mouth 2 (two) times daily. 180 tablet 1 prior to admission 08/21/14  . zolpidem (AMBIEN) 5 MG tablet Take 1 tablet (5 mg total) by mouth at bedtime as needed for sleep. 30 tablet 0 08/24/2014 at Unknown time  . oxyCODONE-acetaminophen (PERCOCET/ROXICET) 5-325 MG per tablet Take 1-2 tablets by mouth every 4-6 hours as needed for moderate-severe pain. (Patient not taking: Reported on 08/25/2014) 40 tablet 0 Not Taking at Unknown time   Scheduled: . sodium chloride   Intravenous Once  . sodium chloride  10 mL/hr Intravenous Once  . ALPRAZolam  0.5 mg Oral QHS  . amiodarone  200 mg Oral Daily  .  antiseptic oral rinse  7 mL Mouth Rinse BID  . atorvastatin  40 mg Oral q1800  . bisacodyl  5 mg  Oral QHS  . docusate sodium  100 mg Oral BID  . DULoxetine  60 mg Oral BID  . furosemide  160 mg Intravenous Q6H  . insulin aspart  0-9 Units Subcutaneous TID AC & HS  . insulin glargine  15 Units Subcutaneous BID  . levothyroxine  75 mcg Oral QAC breakfast  . polyethylene glycol  17 g Oral Daily  . sildenafil  80 mg Oral TID   Continuous: . sodium chloride 10 mL/hr at 08/29/14 0543  . milrinone 0.5 mcg/kg/min (08/30/14 1417)   JQG:BEEFEOFHQ, ALPRAZolam **AND** ALPRAZolam, morphine injection, ondansetron (ZOFRAN) IV, oxyCODONE-acetaminophen **AND** oxyCODONE, polyethylene glycol, sodium chloride, zolpidem  Results for orders placed or performed during the hospital encounter of 08/25/14 (from the past 48 hour(s))  Glucose, capillary     Status: Abnormal   Collection Time: 08/28/14  5:35 PM  Result Value Ref Range   Glucose-Capillary 202 (H) 65 - 99 mg/dL  Glucose, capillary     Status: Abnormal   Collection Time: 08/28/14  9:34 PM  Result Value Ref Range   Glucose-Capillary 193 (H) 65 - 99 mg/dL   Comment 1 Notify RN    Comment 2 Document in Chart   Basic metabolic panel     Status: Abnormal   Collection Time: 08/29/14  5:00 AM  Result Value Ref Range   Sodium 128 (L) 135 - 145 mmol/L   Potassium 5.2 (H) 3.5 - 5.1 mmol/L   Chloride 96 (L) 101 - 111 mmol/L   CO2 23 22 - 32 mmol/L   Glucose, Bld 183 (H) 65 - 99 mg/dL   BUN 75 (H) 6 - 20 mg/dL   Creatinine, Ser 3.32 (H) 0.61 - 1.24 mg/dL   Calcium 8.9 8.9 - 10.3 mg/dL   GFR calc non Af Amer 18 (L) >60 mL/min   GFR calc Af Amer 21 (L) >60 mL/min    Comment: (NOTE) The eGFR has been calculated using the CKD EPI equation. This calculation has not been validated in all clinical situations. eGFR's persistently <60 mL/min signify possible Chronic Kidney Disease.    Anion gap 9 5 - 15  CBC     Status: Abnormal   Collection Time:  08/29/14  5:00 AM  Result Value Ref Range   WBC 6.8 4.0 - 10.5 K/uL   RBC 3.16 (L) 4.22 - 5.81 MIL/uL   Hemoglobin 9.6 (L) 13.0 - 17.0 g/dL   HCT 29.5 (L) 39.0 - 52.0 %   MCV 93.4 78.0 - 100.0 fL   MCH 30.4 26.0 - 34.0 pg   MCHC 32.5 30.0 - 36.0 g/dL   RDW 15.6 (H) 11.5 - 15.5 %   Platelets 240 150 - 400 K/uL  Glucose, capillary     Status: Abnormal   Collection Time: 08/29/14  6:15 AM  Result Value Ref Range   Glucose-Capillary 173 (H) 65 - 99 mg/dL   Comment 1 Notify RN    Comment 2 Document in Chart   Carboxyhemoglobin     Status: Abnormal   Collection Time: 08/29/14 10:48 AM  Result Value Ref Range   Total hemoglobin 9.3 (L) 13.5 - 18.0 g/dL   O2 Saturation 86.1 %   Carboxyhemoglobin 1.5 0.5 - 1.5 %   Methemoglobin 1.1 0.0 - 1.5 %  Glucose, capillary     Status: Abnormal   Collection Time: 08/29/14 12:10 PM  Result Value Ref Range   Glucose-Capillary 199 (H) 65 - 99 mg/dL   Comment 1 Capillary Specimen   Carboxyhemoglobin  Status: Abnormal   Collection Time: 08/29/14 12:47 PM  Result Value Ref Range   Total hemoglobin 8.9 (L) 13.5 - 18.0 g/dL   O2 Saturation 85.7 %   Carboxyhemoglobin 1.7 (H) 0.5 - 1.5 %   Methemoglobin 0.9 0.0 - 1.5 %  Glucose, capillary     Status: Abnormal   Collection Time: 08/29/14  5:36 PM  Result Value Ref Range   Glucose-Capillary 197 (H) 65 - 99 mg/dL   Comment 1 Capillary Specimen   Glucose, capillary     Status: Abnormal   Collection Time: 08/29/14  9:17 PM  Result Value Ref Range   Glucose-Capillary 164 (H) 65 - 99 mg/dL   Comment 1 Capillary Specimen   Carboxyhemoglobin     Status: Abnormal   Collection Time: 08/30/14  3:44 AM  Result Value Ref Range   Total hemoglobin 10.0 (L) 13.5 - 18.0 g/dL   O2 Saturation 71.2 %   Carboxyhemoglobin 1.6 (H) 0.5 - 1.5 %   Methemoglobin 0.9 0.0 - 1.5 %  Basic metabolic panel     Status: Abnormal   Collection Time: 08/30/14  4:10 AM  Result Value Ref Range   Sodium 124 (L) 135 - 145 mmol/L    Potassium 4.9 3.5 - 5.1 mmol/L   Chloride 91 (L) 101 - 111 mmol/L   CO2 20 (L) 22 - 32 mmol/L   Glucose, Bld 262 (H) 65 - 99 mg/dL   BUN 79 (H) 6 - 20 mg/dL   Creatinine, Ser 7.75 (H) 0.61 - 1.24 mg/dL   Calcium 8.6 (L) 8.9 - 10.3 mg/dL   GFR calc non Af Amer 14 (L) >60 mL/min   GFR calc Af Amer 17 (L) >60 mL/min    Comment: (NOTE) The eGFR has been calculated using the CKD EPI equation. This calculation has not been validated in all clinical situations. eGFR's persistently <60 mL/min signify possible Chronic Kidney Disease.    Anion gap 13 5 - 15  CBC     Status: Abnormal   Collection Time: 08/30/14  4:10 AM  Result Value Ref Range   WBC 6.8 4.0 - 10.5 K/uL   RBC 3.15 (L) 4.22 - 5.81 MIL/uL   Hemoglobin 9.8 (L) 13.0 - 17.0 g/dL   HCT 42.6 (L) 64.0 - 59.9 %   MCV 93.3 78.0 - 100.0 fL   MCH 31.1 26.0 - 34.0 pg   MCHC 33.3 30.0 - 36.0 g/dL   RDW 54.2 (H) 82.2 - 14.0 %   Platelets 271 150 - 400 K/uL  Glucose, capillary     Status: Abnormal   Collection Time: 08/30/14  8:06 AM  Result Value Ref Range   Glucose-Capillary 189 (H) 65 - 99 mg/dL   Comment 1 Capillary Specimen   Glucose, capillary     Status: Abnormal   Collection Time: 08/30/14 12:25 PM  Result Value Ref Range   Glucose-Capillary 165 (H) 65 - 99 mg/dL   Comment 1 Capillary Specimen     Dg Chest Port 1 View  08/29/2014   CLINICAL DATA:  Patient with congestive heart failure.  EXAM: PORTABLE CHEST - 1 VIEW  COMPARISON:  Chest radiograph 08/25/2014  FINDINGS: Multi lead pacer apparatus overlies the left hemi thorax, leads are stable in position. Right upper extremity PICC line tip projects over the superior vena cava. Monitoring leads project over the patient. Stable cardiomegaly. No consolidative pulmonary opacities. No pleural effusion or pneumothorax. Regional skeleton is unremarkable.  IMPRESSION: No acute cardiopulmonary process.  Electronically Signed   By: Annia Belt M.D.   On: 08/29/2014 12:14   Dg Abd  Portable 1v  08/30/2014   CLINICAL DATA:  Abdominal pain.  EXAM: PORTABLE ABDOMEN - 1 VIEW  COMPARISON:  08/28/2016.  FINDINGS: Distended loops of small and large bowel are again noted consistent adynamic ileus. Stool is noted colon. No free air is identified. Continued follow-up abdominal exams demonstrate resolution suggested. No acute bony abnormality.  IMPRESSION: Persistent dilated loops of small and large bowel noted suggesting adynamic ileus. Continued upper abdominal series suggested to demonstrate resolution.   Electronically Signed   By: Maisie Fus  Register   On: 08/30/2014 07:15   Dg Abd Portable 1v  08/29/2014   CLINICAL DATA:  64 year old male with nausea and vomiting. Patient is 3 days status post left femoral ORIF.  EXAM: PORTABLE ABDOMEN - 1 VIEW  COMPARISON:  CT abdomen/ pelvis 05/27/2014; pelvic radiograph including lower abdomen 08/26/2014  FINDINGS: Mild gaseous distension of the colon. No significant distension of small bowel. Colonic stool burden within normal limits. Incompletely imaged left femoral hardware. No acute osseous abnormality.  IMPRESSION: Mild gaseous distension of the ascending and transverse colon without evidence of obstruction or small bowel involvement.   Electronically Signed   By: Malachy Moan M.D.   On: 08/29/2014 15:04   Review of Systems  Eyes: Positive for pain.    General: Admits to fatigue. Denies fever, chills, change in appetite and diaphoresis.  Respiratory: Admits to SOB. Denies cough, chest tightness, and wheezing.   Cardiovascular: Denies chest pain and palpitations.  Gastrointestinal: Admits to nausea, vomiting. Denies abdominal pain, diarrhea and abdominal distention.  Genitourinary: Admits to decreased urination. Denies dysuria, urgency, frequency, hematuria, suprapubic pain and flank pain. Neurological: Denies dizziness, headaches, weakness, lightheadedness  Physical Exam Filed Vitals:   08/30/14 0500 08/30/14 0807 08/30/14 1200 08/30/14  1222  BP:  120/38  104/36  Pulse: 88 90 87 88  Temp:  98.3 F (36.8 C)  98.2 F (36.8 C)  TempSrc:  Oral  Oral  Resp: 17 16 18 18   Height:      Weight:      SpO2: 95% 97% 90% 90%   General: Vital signs reviewed.  Patient is fatigued and somnolent, obese, in no acute distress and cooperative with exam. Falls asleep while eval. Cardiovascular: RRR, S1 normal, S2 normal Gr 2/6 holosys M Pulmonary/Chest: Clear to auscultation bilaterally, no wheezes, rales, or rhonchi. Decreased bs, rales in bases Abdominal: Soft, non-tender, non-distended, BS + liver down 6 cm Extremities: 2+ pitting edema to shins bilaterally 2+ pre sacral Skin: Warm, dry and intact. No rashes or erythema. Psychiatric: Normal mood and affect. speech and behavior is normal. Cognition and memory are normal.   Assessment/Plan:  Acute on Chronic Kidney Disease: Creatinine has trended up from 1.8 on admission to 4.0 this morning. AKI is likely secondary to hypoperfusion of the kidneys possibly due to intravascular depletions (N/V) and low blood pressures. DDx includes obstruction, interstitial disease from Ancef. AKI is unlikely related to lasix as patient was not diuresing; therefore, injury would not be from hypoperfusion due to lasix. Patient's last dose of lasix was 7/20. We will proceed with laboratory work up, renal ultrasound, and high dose diuretics. Continue to measure urine output and fluid restrict patient. We can hold off on placing a foley for now. We can continue to monitor patient overnight, but he will likely need CRRT in the near future. There is concern patient is developing uremia due  to his somnolence on physical exam. -Repeat renal function panel today and tomorrow morning -CBC with differential to look for eosinophils -Urine sodium and creatinine -Parathyroid hormone -Renal Ultrasound -Fluid restrict -Strict I/Os, measure UOP, hold off on foley for now, wait for renal ultrasound -Lasix 160 mg IV  Q6H -Stop Xarelto in setting of GFR of 14, switch to Heparin per pharmacy -Discontinue hydralazine due to low BP and hypoperfusion Suspect 3rd spacing, lower bp with surgery and xs meds with poor CO resulting in low RBF.  R/o obstruct, AIN, Need to adjust meds for low GFR.  Discussed poss CRRT.  Hypervolemic Hyponatremia: Likely secondary to acute on chronic kidney failure and CHF. We will proceed with work up. -Urine Osmolality -Serum Osmolality -Urine sodium  -Fluid restrict -Lasix 160 mg IV Q6H (to be given after labs are drawn)  Normocytic Anemia: Hgb 9.8, baseline appears to be 11 in the last year. Likely secondary to chronic kidney disease. Will proceed with iron studies to assess need for ESA.  -Iron -TIBC -Ferritin -Transferrin  Osa Craver, DO PGY-2 Internal Medicine Resident Pager # [phone] 08/30/2014 3:22 PM I have seen and examined this patient and agree with the plan of care seen, examined, thorough hx and review of chart.  Discussed with wife, resident, Dr. Sung Amabile .  Nazaire Cordial L 08/30/2014, 3:34 PM

## 2014-08-30 NOTE — Progress Notes (Signed)
Patient was transferred to [facility] and written/oral handoff provided to [place], LCSWA on 08/29/14.  Current DC plan remains in effect for short term SNF when medically stable.  Patient and wife will need to choose between [facility] and [facility] due to patient's chronic Milrinone needs.  CSW services will continue to provide support and will be available as needed for patient and wife.  [name]. [name], [place]

## 2014-08-31 ENCOUNTER — Inpatient Hospital Stay (HOSPITAL_COMMUNITY): Payer: Commercial Managed Care - HMO

## 2014-08-31 DIAGNOSIS — E871 Hypo-osmolality and hyponatremia: Secondary | ICD-10-CM

## 2014-08-31 LAB — RENAL FUNCTION PANEL
ANION GAP: 15 (ref 5–15)
Albumin: 3.1 g/dL — ABNORMAL LOW (ref 3.5–5.0)
Albumin: 3.2 g/dL — ABNORMAL LOW (ref 3.5–5.0)
Anion gap: 14 (ref 5–15)
BUN: 97 mg/dL — ABNORMAL HIGH (ref 6–20)
BUN: 78 mg/dL — ABNORMAL HIGH (ref 6–20)
CALCIUM: 8 mg/dL — AB (ref 8.9–10.3)
CHLORIDE: 91 mmol/L — AB (ref 101–111)
CO2: 18 mmol/L — AB (ref 22–32)
CO2: 19 mmol/L — ABNORMAL LOW (ref 22–32)
CREATININE: 4.93 mg/dL — AB (ref 0.61–1.24)
Calcium: 8.5 mg/dL — ABNORMAL LOW (ref 8.9–10.3)
Chloride: 90 mmol/L — ABNORMAL LOW (ref 101–111)
Creatinine, Ser: 5.9 mg/dL — ABNORMAL HIGH (ref 0.61–1.24)
GFR calc Af Amer: 11 mL/min — ABNORMAL LOW (ref >60)
GFR calc non Af Amer: 9 mL/min — ABNORMAL LOW (ref >60)
GFR calc non Af Amer: 11 mL/min — ABNORMAL LOW (ref >60)
GFR, EST AFRICAN AMERICAN: 13 mL/min — AB (ref >60)
GLUCOSE: 215 mg/dL — AB (ref 65–99)
GLUCOSE: 246 mg/dL — AB (ref 65–99)
PHOSPHORUS: 7.5 mg/dL — AB (ref 2.5–4.6)
PHOSPHORUS: 6.3 mg/dL — AB (ref 2.5–4.6)
POTASSIUM: 5.4 mmol/L — AB (ref 3.5–5.1)
Potassium: 4.9 mmol/L (ref 3.5–5.1)
SODIUM: 124 mmol/L — AB (ref 135–145)
SODIUM: 123 mmol/L — AB (ref 135–145)

## 2014-08-31 LAB — CBC
HEMATOCRIT: 28.2 % — AB (ref 39.0–52.0)
HEMOGLOBIN: 9.2 g/dL — AB (ref 13.0–17.0)
MCH: 30.8 pg (ref 26.0–34.0)
MCHC: 32.6 g/dL (ref 30.0–36.0)
MCV: 94.3 fL (ref 78.0–100.0)
Platelets: 274 10*3/uL (ref 150–400)
RBC: 2.99 MIL/uL — ABNORMAL LOW (ref 4.22–5.81)
RDW: 16 % — ABNORMAL HIGH (ref 11.5–15.5)
WBC: 7.1 10*3/uL (ref 4.0–10.5)

## 2014-08-31 LAB — URINALYSIS, ROUTINE W REFLEX MICROSCOPIC
GLUCOSE, UA: NEGATIVE mg/dL
Hgb urine dipstick: NEGATIVE
KETONES UR: 15 mg/dL — AB
Nitrite: NEGATIVE
PROTEIN: 100 mg/dL — AB
Specific Gravity, Urine: 1.019 (ref 1.005–1.030)
UROBILINOGEN UA: 1 mg/dL (ref 0.0–1.0)
pH: 5 (ref 5.0–8.0)

## 2014-08-31 LAB — GLUCOSE, CAPILLARY
GLUCOSE-CAPILLARY: 172 mg/dL — AB (ref 65–99)
Glucose-Capillary: 192 mg/dL — ABNORMAL HIGH (ref 65–99)
Glucose-Capillary: 241 mg/dL — ABNORMAL HIGH (ref 65–99)
Glucose-Capillary: 217 mg/dL — ABNORMAL HIGH (ref 65–99)

## 2014-08-31 LAB — OSMOLALITY: Osmolality: 299 mOsm/kg (ref 275–300)

## 2014-08-31 LAB — POCT ACTIVATED CLOTTING TIME
ACTIVATED CLOTTING TIME: 220 s
ACTIVATED CLOTTING TIME: 245 s
ACTIVATED CLOTTING TIME: 159 s
Activated Clotting Time: 269 seconds
Activated Clotting Time: 300 seconds
Activated Clotting Time: 275 seconds
Activated Clotting Time: 251 seconds
Activated Clotting Time: 153 seconds
Activated Clotting Time: 159 seconds
Activated Clotting Time: 159 seconds

## 2014-08-31 LAB — CARBOXYHEMOGLOBIN
Carboxyhemoglobin: 1.8 % — ABNORMAL HIGH (ref 0.5–1.5)
Methemoglobin: 1.4 % (ref 0.0–1.5)
O2 Saturation: 99.6 %
Total hemoglobin: 8.6 g/dL — ABNORMAL LOW (ref 13.5–18.0)

## 2014-08-31 LAB — SODIUM, URINE, RANDOM: SODIUM UR: 13 mmol/L

## 2014-08-31 LAB — HEPARIN LEVEL (UNFRACTIONATED)

## 2014-08-31 LAB — CREATININE, URINE, RANDOM: Creatinine, Urine: 165.06 mg/dL

## 2014-08-31 LAB — APTT
aPTT: 108 seconds — ABNORMAL HIGH (ref 24–37)
aPTT: >200 seconds (ref 24–37)
aPTT: 118 seconds — ABNORMAL HIGH (ref 24–37)

## 2014-08-31 LAB — URINE MICROSCOPIC-ADD ON

## 2014-08-31 MED ORDER — FENTANYL CITRATE (PF) 100 MCG/2ML IJ SOLN
INTRAMUSCULAR | Status: AC
  Administered 2014-08-31: 100 ug
  Filled 2014-08-31: qty 2

## 2014-08-31 MED ORDER — HEPARIN BOLUS VIA INFUSION (CRRT)
1000.0000 [IU] | INTRAVENOUS | Status: DC | PRN
  Filled 2014-08-31 (×4): qty 1000

## 2014-08-31 MED ORDER — PRISMASOL BGK 4/2.5 32-4-2.5 MEQ/L IV SOLN
INTRAVENOUS | Status: DC
  Administered 2014-08-31 – 2014-09-08 (×25): via INTRAVENOUS_CENTRAL
  Filled 2014-08-31 (×31): qty 5000

## 2014-08-31 MED ORDER — BISACODYL 10 MG RE SUPP
10.0000 mg | Freq: Once | RECTAL | Status: DC
  Filled 2014-08-31: qty 1

## 2014-08-31 MED ORDER — HEPARIN SODIUM (PORCINE) 5000 UNIT/ML IJ SOLN
250.0000 [IU]/h | INTRAMUSCULAR | Status: DC
  Administered 2014-08-31: 250 [IU]/h via INTRAVENOUS_CENTRAL
  Administered 2014-09-01 (×2): 1550 [IU]/h via INTRAVENOUS_CENTRAL
  Administered 2014-09-02: 1700 [IU]/h via INTRAVENOUS_CENTRAL
  Administered 2014-09-02: 2050 [IU]/h via INTRAVENOUS_CENTRAL
  Administered 2014-09-02: 1550 [IU]/h via INTRAVENOUS_CENTRAL
  Administered 2014-09-02: 1650 [IU]/h via INTRAVENOUS_CENTRAL
  Administered 2014-09-03 (×2): 3000 [IU]/h via INTRAVENOUS_CENTRAL
  Administered 2014-09-03: 2500 [IU]/h via INTRAVENOUS_CENTRAL
  Administered 2014-09-03: 3000 [IU]/h via INTRAVENOUS_CENTRAL
  Administered 2014-09-04: 2350 [IU]/h via INTRAVENOUS_CENTRAL
  Administered 2014-09-04: 1950 [IU]/h via INTRAVENOUS_CENTRAL
  Administered 2014-09-04: 2450 [IU]/h via INTRAVENOUS_CENTRAL
  Administered 2014-09-04 (×2): 2350 [IU]/h via INTRAVENOUS_CENTRAL
  Administered 2014-09-05 (×4): 1700 [IU]/h via INTRAVENOUS_CENTRAL
  Administered 2014-09-06: 1600 [IU]/h via INTRAVENOUS_CENTRAL
  Administered 2014-09-06: 1700 [IU]/h via INTRAVENOUS_CENTRAL
  Administered 2014-09-06: 1650 [IU]/h via INTRAVENOUS_CENTRAL
  Administered 2014-09-07 (×2): 1700 [IU]/h via INTRAVENOUS_CENTRAL
  Administered 2014-09-07 (×2): 1750 [IU]/h via INTRAVENOUS_CENTRAL
  Administered 2014-09-08: 1700 [IU]/h via INTRAVENOUS_CENTRAL
  Filled 2014-08-31 (×33): qty 2

## 2014-08-31 MED ORDER — HEPARIN SODIUM (PORCINE) 1000 UNIT/ML DIALYSIS
1000.0000 [IU] | INTRAMUSCULAR | Status: DC | PRN
  Administered 2014-08-31 (×2): 1200 [IU] via INTRAVENOUS_CENTRAL
  Administered 2014-09-08: 2400 [IU] via INTRAVENOUS_CENTRAL
  Filled 2014-08-31 (×2): qty 6
  Filled 2014-08-31 (×2): qty 3

## 2014-08-31 MED ORDER — LACTULOSE 10 GM/15ML PO SOLN
20.0000 g | Freq: Once | ORAL | Status: DC
  Filled 2014-08-31: qty 30

## 2014-08-31 MED ORDER — SODIUM CHLORIDE 0.9 % FOR CRRT: INTRAVENOUS_CENTRAL | Status: DC | PRN

## 2014-08-31 MED ORDER — PRISMASOL BGK 4/2.5 32-4-2.5 MEQ/L IV SOLN
INTRAVENOUS | Status: DC
  Administered 2014-08-31 – 2014-09-08 (×25): via INTRAVENOUS_CENTRAL
  Filled 2014-08-31 (×30): qty 5000

## 2014-08-31 MED ORDER — PRISMASOL BGK 4/2.5 32-4-2.5 MEQ/L IV SOLN
INTRAVENOUS | Status: DC
  Administered 2014-08-31 – 2014-09-08 (×45): via INTRAVENOUS_CENTRAL
  Filled 2014-08-31 (×70): qty 5000

## 2014-08-31 MED ORDER — MIDAZOLAM HCL 2 MG/2ML IJ SOLN
INTRAMUSCULAR | Status: AC
  Administered 2014-08-31: 2 mg
  Filled 2014-08-31: qty 2

## 2014-08-31 MED ORDER — DULOXETINE HCL 30 MG PO CPEP
30.0000 mg | ORAL_CAPSULE | Freq: Every day | ORAL | Status: DC
  Administered 2014-08-31 – 2014-10-11 (×42): 30 mg via ORAL
  Filled 2014-08-31 (×50): qty 1

## 2014-08-31 NOTE — Progress Notes (Signed)
Subjective: Interval History: has no complaint.  Objective: Vital signs in last 24 hours: Temp:  [97.5 F (36.4 C)-98.3 F (36.8 C)] 98.2 F (36.8 C) (07/23 0346) Pulse Rate:  [79-92] 83 (07/23 0400) Resp:  [15-25] 17 (07/23 0400) BP: (71-120)/(28-41) 110/41 mmHg (07/23 0353) SpO2:  [90 %-99 %] 99 % (07/23 0400) Weight:  [106.187 kg (234 lb 1.6 oz)] 106.187 kg (234 lb 1.6 oz) (07/23 0346) Weight change: -0.408 kg (-14.4 oz)  Intake/Output from previous day: 07/22 0701 - 07/23 0700 In: 716 [I.V.:650; IV Piggyback:66] Out: -  Intake/Output this shift:    General appearance: cooperative, pale and slowed mentation Resp: diminished breath sounds bilaterally and rales bibasilar Cardio: S1, S2 normal and systolic murmur: holosystolic 2/6, blowing at apex GI: obese,pos bs, soft Extremities: edema 2+  Lab Results:  Recent Labs  08/30/14 1744 08/31/14 0347  WBC 7.5 7.1  HGB 9.2* 9.2*  HCT 28.5* 28.2*  PLT 258 274   BMET:  Recent Labs  08/30/14 1501 08/31/14 0347  NA 124* 124*  K 5.1 5.4*  CL 90* 91*  CO2 20* 18*  GLUCOSE 275* 215*  BUN 89* 97*  CREATININE 5.11* 5.90*  CALCIUM 8.5* 8.5*   No results for input(s): PTH in the last 72 hours. Iron Studies:  Recent Labs  08/30/14 1744  IRON 36*  TIBC 273  TRANSFERRIN 195  FERRITIN 253    Studies/Results: Dg Chest Port 1 View  08/29/2014   CLINICAL DATA:  Patient with congestive heart failure.  EXAM: PORTABLE CHEST - 1 VIEW  COMPARISON:  Chest radiograph 08/25/2014  FINDINGS: Multi lead pacer apparatus overlies the left hemi thorax, leads are stable in position. Right upper extremity PICC line tip projects over the superior vena cava. Monitoring leads project over the patient. Stable cardiomegaly. No consolidative pulmonary opacities. No pleural effusion or pneumothorax. Regional skeleton is unremarkable.  IMPRESSION: No acute cardiopulmonary process.   Electronically Signed   By: Lovey Newcomer M.D.   On: 08/29/2014  12:14   Dg Abd Portable 1v  08/30/2014   CLINICAL DATA:  Abdominal pain.  EXAM: PORTABLE ABDOMEN - 1 VIEW  COMPARISON:  08/28/2016.  FINDINGS: Distended loops of small and large bowel are again noted consistent adynamic ileus. Stool is noted colon. No free air is identified. Continued follow-up abdominal exams demonstrate resolution suggested. No acute bony abnormality.  IMPRESSION: Persistent dilated loops of small and large bowel noted suggesting adynamic ileus. Continued upper abdominal series suggested to demonstrate resolution.   Electronically Signed   By: Marcello Moores  Register   On: 08/30/2014 07:15   Dg Abd Portable 1v  08/29/2014   CLINICAL DATA:  64 year old male with nausea and vomiting. Patient is 3 days status post left femoral ORIF.  EXAM: PORTABLE ABDOMEN - 1 VIEW  COMPARISON:  CT abdomen/ pelvis 05/27/2014; pelvic radiograph including lower abdomen 08/26/2014  FINDINGS: Mild gaseous distension of the colon. No significant distension of small bowel. Colonic stool burden within normal limits. Incompletely imaged left femoral hardware. No acute osseous abnormality.  IMPRESSION: Mild gaseous distension of the ascending and transverse colon without evidence of obstruction or small bowel involvement.   Electronically Signed   By: Jacqulynn Cadet M.D.   On: 08/29/2014 15:04    I have reviewed the patient's current medications.  Assessment/Plan: 1 CKF 3-4, AKI   Most likely secondary to ATN with hypoperfusion.  No urine but 250cc in bladder. Acidemic, hyponatremic and rapid rising solute.  Awaiting U/S and urine chem.  Needs CRRT 2 CM per cards 3  DM controlled 4 Anemic stable 5 HPTH pending 6 OSA 7 Ileus resolving 8 Hip fx 9 Dysrhythmias P CRRT, U/S, urine studies, control DM    LOS: 6 days   Shahira Fiske L 08/31/2014,7:31 AM

## 2014-08-31 NOTE — Procedures (Signed)
Central Venous Trialysis Catheter Insertion Procedure Note RICKARD KENNERLY [id number] 1950/06/29  Procedure: Insertion of Central Venous Trialysis Catheter Indications: Dialysis  Procedure Details Consent: Risks of procedure as well as the alternatives and risks of each were explained to the (patient/caregiver).  Consent for procedure obtained. Time Out: Verified patient identification, verified procedure, site/side was marked, verified correct patient position, special equipment/implants available, medications/allergies/relevent history reviewed, required imaging and test results available.  Performed  Maximum sterile technique was used including antiseptics, cap, gloves, gown, hand hygiene, mask and sheet. Skin prep: Chlorhexidine; local anesthetic administered A antimicrobial bonded/coated triple lumen trialysis catheter was placed in the right internal jugular vein using the Seldinger technique.  Evaluation Blood flow good Complications: No apparent complications Patient did tolerate procedure well. Chest X-ray ordered to verify placement.  CXR: pending.  Glori Bickers MD 08/31/2014, 9:14 AM

## 2014-08-31 NOTE — Progress Notes (Signed)
ANTICOAGULATION CONSULT NOTE - Follow Up Consult  Pharmacy Consult for Heparin (while Xarelto on hold) Indication: atrial fibrillation  Allergies  Allergen Reactions  . Bydureon [Exenatide] Other (See Comments)    sweating  . Losartan Potassium Other (See Comments)    insomnia    Patient Measurements: Height: 5\' 10"  (177.8 cm) Weight: 234 lb 1.6 oz (106.187 kg) IBW/kg (Calculated) : 73 Heparin Dosing Weight: 96 kg  Vital Signs: Temp: 96 F (35.6 C) (07/23 1900) Temp Source: Oral (07/23 1900) BP: 78/31 mmHg (07/23 1900) Pulse Rate: 71 (07/23 1900)  Labs:  Recent Labs  08/30/14 0410 08/30/14 1501 08/30/14 1744 08/31/14 0347 08/31/14 1520 08/31/14 1612 08/31/14 1810  HGB 9.8*  --  9.2* 9.2*  --   --   --   HCT 29.4*  --  28.5* 28.2*  --   --   --   PLT 271  --  258 274  --   --   --   APTT  --   --   --  108* >200*  --  118*  HEPARINUNFRC  --   --   --  >2.20*  --   --   --   CREATININE 4.07* 5.11*  --  5.90*  --  4.93*  --     Estimated Creatinine Clearance: 19 mL/min (by C-G formula based on Cr of 4.93).   Medications:  Heparin @ 1150 units/hr (11.5 ml/hr)  Assessment: 64 YOM on Xarelto PTA for hx Afib - held in the setting of AKI requiring CRRT and transitioned to heparin. aPTT this evening was originally drawn from the same port heparin was running (RN held and flushed line, then drew lab) and came back falsely elevated at >200. The lab was re-drawn via a peripheral stick and still resulted as SUPRAtherapeutic (aPTT 118, goal of 66-102). No overt s/sx of bleeding noted at this time.   Goal of Therapy:  Heparin level 0.3-0.7 units/ml aPTT 66-102 seconds Monitor platelets by anticoagulation protocol: Yes   Plan:  1. Reduce Heparin to 1000 units/hr (10 ml/hr) 2. Will continue to monitor for any signs/symptoms of bleeding and will follow up with heparin level in 8 hours   Alycia Rossetti, PharmD, BCPS Clinical Pharmacist Pager: [phone] 08/31/2014 7:52  PM

## 2014-08-31 NOTE — Progress Notes (Signed)
[facility] PROGRESS NOTE  Larry [name] TKP:[id number] DOB: Apr 18, 1950 DOA: 08/25/2014 PCP: [name], MD  Assessment/Plan:  1. Severe nonischemic cardiomyopathy with chronic systolic heart failure with an EF of 25%. History of V. tach. Patient has AICD, on home milrinone drip -Patient having a 4 kg weight gain since 08/26/2014, his weight increasing to 106.1 kg from 102kg -He was started on 160 mg of IV Lasix every 6 hours by nephrology having little urinary output in the past 24 hours. -He appears stable from a respiratory standpoint however has anasarca -Metoprolol stopped due to hypotension -Cardiology following  2.  Acute on chronic renal failure -Labs showing ongoing upward trend in his creatinine, rising to 5.9 from 4.07, a.m. labs also showing a bicarbonate of 18 with potassium of 5.4. He was started on IV Lasix yesterday by nephrology at 160 mg IV every 6 hours. Despite this intervention is had little urinary output. -Xarelto was discontinued and started on IV heparin -Nephrology was consulted yesterday, feeling renal failure likely represents ATN/hypoperfusion -Plan for CRRT today.   3. Nausea/vomiting -Patient having episode of nausea vomiting is secondary to postoperative ileus -Patient reporting significant improvement after having bowel movement. -He is on Mira lax, Colace, lactulose and suppository for constipation  4.  Hyponatremia -Labs showing downward trend and sodium to 124 -I suspect secondary to volume overload -Patient to start CRRT  5. Mechanical trip and fall with left femoral intertrochanteric fracture.  -status post intramedullary nail to left hip on 7/19 -WBAT -PT/OT evaluation -Plan for discharge to SNF when medically stable  6. Paroxysmal atrial fibrillation.  -Xarelto discontinued due to worsening kidney function, now on IV heparin  7. Hyponatremia.  -Lab showing sodium level trended down to 124 from 128. Likely secondary to  volume overload  8. Obstructive sleep apnea.  -Patient refusing BiPAP  9. DM type II. recent A1c of 7.7 -continue Lantus and SSI -Blood sugars remaining in the 100 range, on Lantus 15 units subcutaneous twice a day   Code Status: Full Family Communication: wife at bedside Disposition Plan: Patient was transferred to [facility] for closer monitoring   Consultants:  Ortho (Dr. Percell Miller)  HF team   Nephrology  Procedures:  S/P intramedullary nail on left hip on 7/18  Antibiotics:  None   HPI/Subjective: Patient denies worsening shortness of breath or chest pain. Has not had further episodes of N/V  Objective: Filed Vitals:   08/31/14 0744  BP: 109/34  Pulse: 79  Temp: 98.2 F (36.8 C)  Resp: 17    Intake/Output Summary (Last 24 hours) at 08/31/14 0800 Last data filed at 08/31/14 0500  Gross per 24 hour  Intake 716.02 ml  Output      0 ml  Net 716.02 ml   Filed Weights   08/29/14 0532 08/30/14 0400 08/31/14 0346  Weight: 105.235 kg (232 lb) 106.595 kg (235 lb) 106.187 kg (234 lb 1.6 oz)    Exam:   General:  He is awake and alert, oriented, follows commands now  Cardiovascular:norubs, no gallops, RRR  Respiratory: Has bibasilar crackles, no wheezing rhonchi, normal respiratory effort  Abdomen: soft, NT, ND, positive BS Musculoskeletal: Surgical incision site appears clean, no evidence of infection. Has 1-2+ bilateral lower extremity pitting edema Data Reviewed: Basic Metabolic Panel:  Recent Labs Lab 08/26/14 0605  08/28/14 0536 08/29/14 0500 08/30/14 0410 08/30/14 1501 08/31/14 0347  NA 129*  < > 129* 128* 124* 124* 124*  K 4.6  < > 5.1 5.2* 4.9 5.1  5.4*  CL 95*  < > 95* 96* 91* 90* 91*  CO2 25  < > 23 23 20* 20* 18*  GLUCOSE 172*  < > 159* 183* 262* 275* 215*  BUN 47*  < > 64* 75* 79* 89* 97*  CREATININE 2.29*  < > 2.67* 3.32* 4.07* 5.11* 5.90*  CALCIUM 8.6*  < > 8.6* 8.9 8.6* 8.5* 8.5*  MG 2.1  --   --   --   --   --   --   PHOS  --   --    --   --   --  6.2* 7.5*  < > = values in this interval not displayed. CBC:  Recent Labs Lab 08/25/14 1458  08/27/14 0805 08/29/14 0500 08/30/14 0410 08/30/14 1744 08/31/14 0347  WBC 8.5  < > 6.9 6.8 6.8 7.5 7.1  NEUTROABS 6.8  --   --   --   --  5.4  --   HGB 9.9*  < > 9.9* 9.6* 9.8* 9.2* 9.2*  HCT 30.7*  < > 29.7* 29.5* 29.4* 28.5* 28.2*  MCV 95.6  < > 92.8 93.4 93.3 94.1 94.3  PLT 241  < > 184 240 271 258 274  < > = values in this interval not displayed. BNP (last 3 results)  Recent Labs  05/22/14 1029 05/27/14 1126 06/19/14 1030  BNP 935.2* 1627.8* 738.9*    ProBNP (last 3 results)  Recent Labs  10/09/13 0926 11/14/13 0926 01/14/14 1112  PROBNP 1561.0* 1261.0* 5769.0*   CBG:  Recent Labs Lab 08/29/14 2117 08/30/14 0806 08/30/14 1225 08/30/14 1605 08/30/14 2146  GLUCAP 164* 189* 165* 159* 191*    Recent Results (from the past 240 hour(s))  MRSA PCR Screening     Status: None   Collection Time: 08/21/14  6:50 PM  Result Value Ref Range Status   MRSA by PCR NEGATIVE NEGATIVE Final    Comment:        The GeneXpert MRSA Assay (FDA approved for NASAL specimens only), is one component of a comprehensive MRSA colonization surveillance program. It is not intended to diagnose MRSA infection nor to guide or monitor treatment for MRSA infections.   Surgical pcr screen     Status: None   Collection Time: 08/21/14 10:38 PM  Result Value Ref Range Status   MRSA, PCR NEGATIVE NEGATIVE Final   Staphylococcus aureus NEGATIVE NEGATIVE Final    Comment:        The Xpert SA Assay (FDA approved for NASAL specimens in patients over 29 years of age), is one component of a comprehensive surveillance program.  Test performance has been validated by [facility] for patients greater than or equal to 94 year old. It is not intended to diagnose infection nor to guide or monitor treatment.   Urine culture     Status: None   Collection Time: 08/25/14  4:12 PM   Result Value Ref Range Status   Specimen Description URINE, CLEAN CATCH  Final   Special Requests NONE  Final   Culture NO GROWTH 1 DAY  Final   Report Status 08/26/2014 FINAL  Final     Studies: Dg Chest Port 1 View  08/29/2014   CLINICAL DATA:  Patient with congestive heart failure.  EXAM: PORTABLE CHEST - 1 VIEW  COMPARISON:  Chest radiograph 08/25/2014  FINDINGS: Multi lead pacer apparatus overlies the left hemi thorax, leads are stable in position. Right upper extremity PICC line tip projects over the superior vena  cava. Monitoring leads project over the patient. Stable cardiomegaly. No consolidative pulmonary opacities. No pleural effusion or pneumothorax. Regional skeleton is unremarkable.  IMPRESSION: No acute cardiopulmonary process.   Electronically Signed   By: Lovey Newcomer M.D.   On: 08/29/2014 12:14   Dg Abd Portable 1v  08/30/2014   CLINICAL DATA:  Abdominal pain.  EXAM: PORTABLE ABDOMEN - 1 VIEW  COMPARISON:  08/28/2016.  FINDINGS: Distended loops of small and large bowel are again noted consistent adynamic ileus. Stool is noted colon. No free air is identified. Continued follow-up abdominal exams demonstrate resolution suggested. No acute bony abnormality.  IMPRESSION: Persistent dilated loops of small and large bowel noted suggesting adynamic ileus. Continued upper abdominal series suggested to demonstrate resolution.   Electronically Signed   By: Marcello Moores  Register   On: 08/30/2014 07:15   Dg Abd Portable 1v  08/29/2014   CLINICAL DATA:  64 year old male with nausea and vomiting. Patient is 3 days status post left femoral ORIF.  EXAM: PORTABLE ABDOMEN - 1 VIEW  COMPARISON:  CT abdomen/ pelvis 05/27/2014; pelvic radiograph including lower abdomen 08/26/2014  FINDINGS: Mild gaseous distension of the colon. No significant distension of small bowel. Colonic stool burden within normal limits. Incompletely imaged left femoral hardware. No acute osseous abnormality.  IMPRESSION: Mild gaseous  distension of the ascending and transverse colon without evidence of obstruction or small bowel involvement.   Electronically Signed   By: Jacqulynn Cadet M.D.   On: 08/29/2014 15:04    Scheduled Meds: . sodium chloride   Intravenous Once  . sodium chloride  10 mL/hr Intravenous Once  . ALPRAZolam  0.5 mg Oral QHS  . amiodarone  200 mg Oral Daily  . antiseptic oral rinse  7 mL Mouth Rinse BID  . atorvastatin  40 mg Oral q1800  . bisacodyl  5 mg Oral QHS  . bisacodyl  10 mg Rectal Once  . docusate sodium  100 mg Oral BID  . DULoxetine  60 mg Oral BID  . furosemide  160 mg Intravenous Q6H  . insulin aspart  0-9 Units Subcutaneous TID AC & HS  . insulin glargine  15 Units Subcutaneous BID  . lactulose  20 g Oral Once  . levothyroxine  75 mcg Oral QAC breakfast  . polyethylene glycol  17 g Oral Daily  . sildenafil  80 mg Oral TID   Continuous Infusions: . sodium chloride 10 mL/hr at 08/29/14 0543  . heparin 10,000 units/ 20 mL infusion syringe    . heparin 1,150 Units/hr (08/31/14 0535)  . milrinone 0.5 mcg/kg/min (08/31/14 0437)  . dialysis replacement fluid (prismasate)    . dialysis replacement fluid (prismasate)    . dialysate (PRISMASATE)      Principal Problem:   Fracture, intertrochanteric, left femur Active Problems:   Essential hypertension, benign   Persistent atrial fibrillation   Automatic implantable cardioverter-defibrillator in situ   DM2 (diabetes mellitus, type 2)   OSA (obstructive sleep apnea), intolerant to CPAP/BIPAP   Ventricular tachycardia   Chronic systolic CHF (congestive heart failure)   Hip fracture   Left femoral shaft fracture   Acute on chronic renal failure    Time spent: 35 minutes   Kelvin Cellar  [facility] Pager [phone]. If 7PM-7AM, please contact night-coverage at [email or web address], password [facility] 08/31/2014, 8:00 AM  LOS: 6 days

## 2014-08-31 NOTE — Progress Notes (Signed)
Pt is currently on a 2 LPM [place]. He does not need to wear the bi-pap at this time. His sat was 96% when RT walked in the room to check on him. RT will continue to monitor.

## 2014-08-31 NOTE — Progress Notes (Signed)
Dr Deterding rounded on pt, wants a urinary catheter placed for accurate monitoring.  Pt having difficulty urinating, not sure if he is making urine, but bladder scan at 0530 showed 250cc per report from night shift.  Urinary catheter placed using sterile technique.  85F.  Pt tolerated procedure well, urine returned immediately.    Carol Ada, RN

## 2014-08-31 NOTE — Progress Notes (Signed)
ANTICOAGULATION CONSULT NOTE - Follow Up Consult  Pharmacy Consult for Heparin (xarelto on hold) Indication: atrial fibrillation  Allergies  Allergen Reactions  . Bydureon [Exenatide] Other (See Comments)    sweating  . Losartan Potassium Other (See Comments)    insomnia    Patient Measurements: Height: 5\' 10"  (177.8 cm) Weight: 234 lb 1.6 oz (106.187 kg) IBW/kg (Calculated) : 73   Vital Signs: Temp: 98.2 F (36.8 C) (07/23 0346) Temp Source: Oral (07/23 0346) BP: 108/35 mmHg (07/23 0030) Pulse Rate: 83 (07/23 0346)  Labs:  Recent Labs  08/30/14 0410 08/30/14 1501 08/30/14 1744 08/31/14 0347  HGB 9.8*  --  9.2* 9.2*  HCT 29.4*  --  28.5* 28.2*  PLT 271  --  258 274  APTT  --   --   --  108*  HEPARINUNFRC  --   --   --  >2.20*  CREATININE 4.07* 5.11*  --  5.90*    Estimated Creatinine Clearance: 15.8 mL/min (by C-G formula based on Cr of 5.9).  Assessment: Xarelto held due to renal failure, aPTT is slightly supra-therapeutic this AM at 108 (using aPTT to dose for now given Xarelto influence on anti-Xa level). No issues per RN.   Goal of Therapy:  Heparin level 0.3-0.7 units/ml aPTT 66-102 seconds Monitor platelets by anticoagulation protocol: Yes   Plan:  -Decrease heparin to 1150 units/hr -Check 1400 aPTT -Daily CBC/aPTT/HL -Monitor for bleeding  Narda Bonds 08/31/2014,5:26 AM

## 2014-08-31 NOTE — Progress Notes (Signed)
Pt having frequent pvcs and nonsustained vtach drTurner notified w/ order to check mg and pot. Level.

## 2014-08-31 NOTE — Progress Notes (Signed)
PT Cancellation Note  Patient Details Name: Larry [name] MRN: [id number] DOB: [birth year]   Cancelled Treatment:    Reason Eval/Treat Not Completed: Medical issues which prohibited therapy (pt transferred to ICU for initiation of CVVHD and not appropriate at this time)   [name] 08/31/2014, 10:21 AM [name], [place]

## 2014-08-31 NOTE — Progress Notes (Addendum)
Advanced Heart Failure Rounding Note   Subjective:    [name] is a 64 y/o man with history of chronic systolic CHF (nonischemic cardiomyopathy) s/p 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 He is on chronic milrinone 0.5 mcg.  Prior to admit he was being considered for LVAD.   Dischaged 7/16 after dental procedures. Also had RHC with elevate pulmonary pressures. Revatio was increased to 80 mg tid during that stay.  Admitted with fall---> broken L  hip . 7/18 had IM Nail L femur.  7/23 started CVVHD  Remains confused. Mildly short of breath.  Seen by Renal who is recommending CVVHD.    Objective:   Weight Range:  Vital Signs:   Temp:  [97.5 F (36.4 C)-98.3 F (36.8 C)] 98.2 F (36.8 C) (07/23 0744) Pulse Rate:  [79-92] 79 (07/23 0744) Resp:  [15-25] 17 (07/23 0744) BP: (71-120)/(28-41) 109/34 mmHg (07/23 0744) SpO2:  [90 %-100 %] 100 % (07/23 0744) Weight:  [106.187 kg (234 lb 1.6 oz)] 106.187 kg (234 lb 1.6 oz) (07/23 0346) Last BM Date: 08/29/14  Weight change: Filed Weights   08/29/14 0532 08/30/14 0400 08/31/14 0346  Weight: 105.235 kg (232 lb) 106.595 kg (235 lb) 106.187 kg (234 lb 1.6 oz)    Intake/Output:   Intake/Output Summary (Last 24 hours) at 08/31/14 0801 Last data filed at 08/31/14 0500  Gross per 24 hour  Intake 716.02 ml  Output      0 ml  Net 716.02 ml     Physical Exam: General:  In chair Wife at bed side.  HEENT: normal Neck: supple. JVP jawCarotids 2+ bilat; no bruits. No lymphadenopathy or thryomegaly appreciated. Cor: PMI nondisplaced. Regular rate & rhythm. No rubs, gallops or murmurs. Lungs: Diminished both bases with mild crackles Abdomen: soft, nontender, nondistended. No hepatosplenomegaly. No bruits or masses. Good bowel sounds. Extremities: no cyanosis, clubbing, rash. RUE PICC dual lumen . R Thigh lateral aspect dressing intact x2. 2-3+  edema Neuro: Awake. Lethargic but conversant,  Cranial nerves grossly intact. Affect pleasant. Moves extremities without difficulty. L leg tender.  Telemetry: NSR   Labs: Basic Metabolic Panel:  Recent Labs Lab 08/26/14 0605  08/28/14 0536 08/29/14 0500 08/30/14 0410 08/30/14 1501 08/31/14 0347  NA 129*  < > 129* 128* 124* 124* 124*  K 4.6  < > 5.1 5.2* 4.9 5.1 5.4*  CL 95*  < > 95* 96* 91* 90* 91*  CO2 25  < > 23 23 20* 20* 18*  GLUCOSE 172*  < > 159* 183* 262* 275* 215*  BUN 47*  < > 64* 75* 79* 89* 97*  CREATININE 2.29*  < > 2.67* 3.32* 4.07* 5.11* 5.90*  CALCIUM 8.6*  < > 8.6* 8.9 8.6* 8.5* 8.5*  MG 2.1  --   --   --   --   --   --   PHOS  --   --   --   --   --  6.2* 7.5*  < > = values in this interval not displayed.  Liver Function Tests:  Recent Labs Lab 08/30/14 1501 08/31/14 0347  ALBUMIN 3.0* 3.1*   No results for input(s): LIPASE, AMYLASE in the last 168 hours. No results for input(s): AMMONIA in the last 168 hours.  CBC:  Recent Labs Lab 08/25/14 1458  08/27/14 0805 08/29/14 0500 08/30/14 0410 08/30/14 1744 08/31/14 0347  WBC 8.5  < > 6.9 6.8  6.8 7.5 7.1  NEUTROABS 6.8  --   --   --   --  5.4  --   HGB 9.9*  < > 9.9* 9.6* 9.8* 9.2* 9.2*  HCT 30.7*  < > 29.7* 29.5* 29.4* 28.5* 28.2*  MCV 95.6  < > 92.8 93.4 93.3 94.1 94.3  PLT 241  < > 184 240 271 258 274  < > = values in this interval not displayed.  Cardiac Enzymes: No results for input(s): CKTOTAL, CKMB, CKMBINDEX, TROPONINI in the last 168 hours.  BNP: BNP (last 3 results)  Recent Labs  05/22/14 1029 05/27/14 1126 06/19/14 1030  BNP 935.2* 1627.8* 738.9*    ProBNP (last 3 results)  Recent Labs  10/09/13 0926 11/14/13 0926 01/14/14 1112  PROBNP 1561.0* 1261.0* 5769.0*      Other results:  Imaging: Dg Chest Port 1 View  08/29/2014   CLINICAL DATA:  Patient with congestive heart failure.  EXAM: PORTABLE CHEST - 1 VIEW  COMPARISON:  Chest radiograph 08/25/2014   FINDINGS: Multi lead pacer apparatus overlies the left hemi thorax, leads are stable in position. Right upper extremity PICC line tip projects over the superior vena cava. Monitoring leads project over the patient. Stable cardiomegaly. No consolidative pulmonary opacities. No pleural effusion or pneumothorax. Regional skeleton is unremarkable.  IMPRESSION: No acute cardiopulmonary process.   Electronically Signed   By: Lovey Newcomer M.D.   On: 08/29/2014 12:14   Dg Abd Portable 1v  08/30/2014   CLINICAL DATA:  Abdominal pain.  EXAM: PORTABLE ABDOMEN - 1 VIEW  COMPARISON:  08/28/2016.  FINDINGS: Distended loops of small and large bowel are again noted consistent adynamic ileus. Stool is noted colon. No free air is identified. Continued follow-up abdominal exams demonstrate resolution suggested. No acute bony abnormality.  IMPRESSION: Persistent dilated loops of small and large bowel noted suggesting adynamic ileus. Continued upper abdominal series suggested to demonstrate resolution.   Electronically Signed   By: Marcello Moores  Register   On: 08/30/2014 07:15   Dg Abd Portable 1v  08/29/2014   CLINICAL DATA:  64 year old male with nausea and vomiting. Patient is 3 days status post left femoral ORIF.  EXAM: PORTABLE ABDOMEN - 1 VIEW  COMPARISON:  CT abdomen/ pelvis 05/27/2014; pelvic radiograph including lower abdomen 08/26/2014  FINDINGS: Mild gaseous distension of the colon. No significant distension of small bowel. Colonic stool burden within normal limits. Incompletely imaged left femoral hardware. No acute osseous abnormality.  IMPRESSION: Mild gaseous distension of the ascending and transverse colon without evidence of obstruction or small bowel involvement.   Electronically Signed   By: Jacqulynn Cadet M.D.   On: 08/29/2014 15:04     Medications:     Scheduled Medications: . sodium chloride   Intravenous Once  . sodium chloride  10 mL/hr Intravenous Once  . ALPRAZolam  0.5 mg Oral QHS  . amiodarone   200 mg Oral Daily  . antiseptic oral rinse  7 mL Mouth Rinse BID  . atorvastatin  40 mg Oral q1800  . bisacodyl  5 mg Oral QHS  . bisacodyl  10 mg Rectal Once  . docusate sodium  100 mg Oral BID  . DULoxetine  60 mg Oral BID  . furosemide  160 mg Intravenous Q6H  . insulin aspart  0-9 Units Subcutaneous TID AC & HS  . insulin glargine  15 Units Subcutaneous BID  . lactulose  20 g Oral Once  . levothyroxine  75 mcg Oral QAC breakfast  .  polyethylene glycol  17 g Oral Daily  . sildenafil  80 mg Oral TID    Infusions: . sodium chloride 10 mL/hr at 08/29/14 0543  . heparin 10,000 units/ 20 mL infusion syringe    . heparin 1,150 Units/hr (08/31/14 0535)  . milrinone 0.5 mcg/kg/min (08/31/14 0437)  . dialysis replacement fluid (prismasate)    . dialysis replacement fluid (prismasate)    . dialysate (PRISMASATE)      PRN Medications: albuterol, ALPRAZolam **AND** ALPRAZolam, heparin, heparin, morphine injection, ondansetron (ZOFRAN) IV, oxyCODONE-acetaminophen **AND** oxyCODONE, polyethylene glycol, sodium chloride, sodium chloride, zolpidem   Assessment:  1. Left Hip Fracture s/p IM nail on 08/26/14 2. Chronic systolic CHF- on home milrinone 0.73mcg 3. Acute on chronic renal failure - suspect post-op ATN  4.  Paroxysmal atrial fibrillation requiring DCCV 02/2014, 04/2014- on chronic amio and xarelto. CHADS VASc Score- 5 5. Diabetes mellitus type 2 6. Depression/anxiety 7. OSA, complex- Needs Bipap at night but had difficulty after dental procedure.  8 Pulmonary HTN - Mixed pulmonary venous hypertension and PAH- on  9. Dental extractions 08/2014 by Dr Enrique Sack 10. Obesity Body mass index is 32.39 kg/(m^2). 11. Probable chronic respiratory failure - requiring home O2 with ambulation as of this admission 12. Hyponatremia -  13. NSVT  Plan/Discussion:    Has progressive renal failure with volume overload and hyperkalemia. Renal recommending CRRT. Will place Trialysis catheter.  Will  continue milrinone.   Sinus Rhythm with PVCs. Long PR interval noted on EKG . Continue amio. Off Metoprolol. Off Xarelto due to renal failure. Now on heparin. Pharmacy following.     Length of Stay: 6  Bensimhon, Daniel,MD 8:04 AM Advanced Heart Failure Team Pager [phone] (M-F; 7a - 4p)  Please contact [facility] for night-coverage after hours (4p -7a ) and weekends on [email or web address]

## 2014-09-01 DIAGNOSIS — I472 Ventricular tachycardia: Secondary | ICD-10-CM

## 2014-09-01 LAB — RENAL FUNCTION PANEL
ANION GAP: 7 (ref 5–15)
Albumin: 3.1 g/dL — ABNORMAL LOW (ref 3.5–5.0)
Albumin: 3.5 g/dL (ref 3.5–5.0)
Anion gap: 10 (ref 5–15)
BUN: 47 mg/dL — AB (ref 6–20)
BUN: 23 mg/dL — ABNORMAL HIGH (ref 6–20)
CALCIUM: 8.1 mg/dL — AB (ref 8.9–10.3)
CALCIUM: 8.8 mg/dL — AB (ref 8.9–10.3)
CO2: 23 mmol/L (ref 22–32)
CO2: 26 mmol/L (ref 22–32)
Chloride: 95 mmol/L — ABNORMAL LOW (ref 101–111)
Chloride: 100 mmol/L — ABNORMAL LOW (ref 101–111)
Creatinine, Ser: 3.36 mg/dL — ABNORMAL HIGH (ref 0.61–1.24)
Creatinine, Ser: 1.97 mg/dL — ABNORMAL HIGH (ref 0.61–1.24)
GFR calc Af Amer: 21 mL/min — ABNORMAL LOW (ref >60)
GFR calc non Af Amer: 18 mL/min — ABNORMAL LOW (ref >60)
GFR calc non Af Amer: 35 mL/min — ABNORMAL LOW (ref >60)
GFR, EST AFRICAN AMERICAN: 40 mL/min — AB (ref >60)
Glucose, Bld: 122 mg/dL — ABNORMAL HIGH (ref 65–99)
Glucose, Bld: 121 mg/dL — ABNORMAL HIGH (ref 65–99)
PHOSPHORUS: 2.3 mg/dL — AB (ref 2.5–4.6)
POTASSIUM: 4.7 mmol/L (ref 3.5–5.1)
Phosphorus: 4.4 mg/dL (ref 2.5–4.6)
Potassium: 4.9 mmol/L (ref 3.5–5.1)
SODIUM: 133 mmol/L — AB (ref 135–145)
Sodium: 128 mmol/L — ABNORMAL LOW (ref 135–145)

## 2014-09-01 LAB — CBC
HCT: 28.1 % — ABNORMAL LOW (ref 39.0–52.0)
Hemoglobin: 9.2 g/dL — ABNORMAL LOW (ref 13.0–17.0)
MCH: 31 pg (ref 26.0–34.0)
MCHC: 32.7 g/dL (ref 30.0–36.0)
MCV: 94.6 fL (ref 78.0–100.0)
Platelets: 258 10*3/uL (ref 150–400)
RBC: 2.97 MIL/uL — ABNORMAL LOW (ref 4.22–5.81)
RDW: 16 % — AB (ref 11.5–15.5)
WBC: 6 10*3/uL (ref 4.0–10.5)

## 2014-09-01 LAB — APTT
aPTT: 139 seconds — ABNORMAL HIGH (ref 24–37)
aPTT: >200 seconds (ref 24–37)
aPTT: >200 seconds (ref 24–37)
aPTT: >200 seconds (ref 24–37)

## 2014-09-01 LAB — POCT ACTIVATED CLOTTING TIME
ACTIVATED CLOTTING TIME: 159 s
ACTIVATED CLOTTING TIME: 165 s
ACTIVATED CLOTTING TIME: 171 s
ACTIVATED CLOTTING TIME: 177 s
ACTIVATED CLOTTING TIME: 196 s
ACTIVATED CLOTTING TIME: 202 s
Activated Clotting Time: 159 seconds
Activated Clotting Time: 159 seconds
Activated Clotting Time: 165 seconds
Activated Clotting Time: 165 seconds
Activated Clotting Time: 165 seconds
Activated Clotting Time: 171 seconds
Activated Clotting Time: 177 seconds
Activated Clotting Time: 177 seconds
Activated Clotting Time: 183 seconds
Activated Clotting Time: 190 seconds
Activated Clotting Time: 190 seconds
Activated Clotting Time: 190 seconds
Activated Clotting Time: 196 seconds
Activated Clotting Time: 202 seconds
Activated Clotting Time: 202 seconds

## 2014-09-01 LAB — CARBOXYHEMOGLOBIN
CARBOXYHEMOGLOBIN: 1.9 % — AB (ref 0.5–1.5)
Methemoglobin: 1.2 % (ref 0.0–1.5)
O2 SAT: 78.8 %
TOTAL HEMOGLOBIN: 9.4 g/dL — AB (ref 13.5–18.0)

## 2014-09-01 LAB — MAGNESIUM
Magnesium: 2.3 mg/dL (ref 1.7–2.4)
Magnesium: 2.5 mg/dL — ABNORMAL HIGH (ref 1.7–2.4)

## 2014-09-01 LAB — GLUCOSE, CAPILLARY
GLUCOSE-CAPILLARY: 148 mg/dL — AB (ref 65–99)
Glucose-Capillary: 98 mg/dL (ref 65–99)
Glucose-Capillary: 110 mg/dL — ABNORMAL HIGH (ref 65–99)
Glucose-Capillary: 110 mg/dL — ABNORMAL HIGH (ref 65–99)

## 2014-09-01 LAB — OSMOLALITY, URINE: Osmolality, Ur: 322 mOsm/kg — ABNORMAL LOW (ref 390–1090)

## 2014-09-01 LAB — POTASSIUM: POTASSIUM: 4.8 mmol/L (ref 3.5–5.1)

## 2014-09-01 LAB — HEPARIN LEVEL (UNFRACTIONATED): Heparin Unfractionated: >2.2 IU/mL — ABNORMAL HIGH (ref 0.30–0.70)

## 2014-09-01 MED ORDER — AMIODARONE HCL 200 MG PO TABS
200.0000 mg | ORAL_TABLET | Freq: Two times a day (BID) | ORAL | Status: DC
  Administered 2014-09-01 – 2014-09-02 (×4): 200 mg via ORAL
  Filled 2014-09-01 (×7): qty 1

## 2014-09-01 MED ORDER — MILRINONE IN DEXTROSE 20 MG/100ML IV SOLN
0.2500 ug/kg/min | INTRAVENOUS | Status: DC
  Administered 2014-09-01 – 2014-09-04 (×6): 0.25 ug/kg/min via INTRAVENOUS
  Filled 2014-09-01 (×6): qty 100

## 2014-09-01 NOTE — Progress Notes (Signed)
Subjective: Interval History: has no complaint.  Objective: Vital signs in last 24 hours: Temp:  [96 F (35.6 C)-98.2 F (36.8 C)] 98 F (36.7 C) (07/24 0430) Pulse Rate:  [50-95] 81 (07/24 0600) Resp:  [13-24] 16 (07/24 0600) BP: (72-109)/(23-50) 107/38 mmHg (07/24 0600) SpO2:  [92 %-100 %] 97 % (07/24 0600) Weight:  [105.3 kg (232 lb 2.3 oz)] 105.3 kg (232 lb 2.3 oz) (07/24 0300) Weight change: -0.887 kg (-1 lb 15.3 oz)  Intake/Output from previous day: 07/23 0701 - 07/24 0700 In: 608.4 [I.V.:476.4; IV Piggyback:132] Out: 1024 [Urine:265] Intake/Output this shift: Total I/O In: 153.2 [I.V.:153.2] Out: 290 [Urine:15; Other:275]  General appearance: alert, cooperative, mildly obese and pale Neck: RIJ cath Resp: diminished breath sounds bilaterally and rales bibasilar Cardio: S1, S2 normal and systolic murmur: holosystolic 2/6, blowing at apex GI: pos bs, liver down 6 cm Extremities: edema 2+  Lab Results:  Recent Labs  08/31/14 0347 09/01/14 0425  WBC 7.1 6.0  HGB 9.2* 9.2*  HCT 28.2* 28.1*  PLT 274 258   BMET:  Recent Labs  08/31/14 1612 08/31/14 2345 09/01/14 0426  NA 123*  --  128*  K 4.9 4.8 4.9  CL 90*  --  95*  CO2 19*  --  23  GLUCOSE 246*  --  122*  BUN 78*  --  47*  CREATININE 4.93*  --  3.36*  CALCIUM 8.0*  --  8.1*   No results for input(s): PTH in the last 72 hours. Iron Studies:  Recent Labs  08/30/14 1744  IRON 36*  TIBC 273  TRANSFERRIN 195  FERRITIN 253    Studies/Results: US Renal  08/31/2014   CLINICAL DATA:  Current history of hypertension and diabetes, presenting with acute kidney injury and oliguria.  EXAM: RENAL / URINARY TRACT ULTRASOUND COMPLETE  COMPARISON:  Unenhanced CT abdomen and pelvis 05/27/2014.  FINDINGS: Right Kidney:  Length: Approximately 11.1 cm. No hydronephrosis. Well-preserved cortex. No shadowing calculi. Normal parenchymal echotexture. No focal parenchymal abnormality.  Left Kidney:  Length: Approximate  1.5 cm. No hydronephrosis. Well-preserved cortex. No shadowing calculi. Normal parenchymal echotexture. Exophytic 1.8 x 2.2 x 1.7 cm hypoechoic mass with acoustic enhancement and no internal color Doppler flow, shown on the prior CT to represent a hemorrhagic cyst. No significant focal parenchymal abnormality.  Bladder:  Decompressed by Foley catheter.  IMPRESSION: No significant abnormalities. Approximate 2 cm cyst arising from the lower pole of the left kidney, unchanged from the CT in April, 2016.   Electronically Signed   By: Evangeline Dakin M.D.   On: 08/31/2014 17:04   Dg Chest Port 1 View  08/31/2014   CLINICAL DATA:  Status post central line placement  EXAM: PORTABLE CHEST - 1 VIEW  COMPARISON:  08/29/2014  FINDINGS: Cardiac shadow remains enlarged. A pacing device is again seen a right-sided PICC line is again noted in distal superior vena cava. A new right jugular central line has been placed an lies within the distal superior vena cava. No pneumothorax is noted. No focal infiltrate is seen. No bony abnormality is noted.  IMPRESSION: Status post jugular central line placement in satisfactory position. Remainder of the exam is stable from the previous study   Electronically Signed   By: Inez Catalina M.D.   On: 08/31/2014 09:37    I have reviewed the patient's current medications.  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  Secondary to hypoperfusion, low FENA.  Should recover if stable.  SNa improving. Solute/acid/base  improving.  Mechanics of CRRT ok.  2 DM controlled 3 CM stable 4 Anemia stable 5 Hip fx 6 OSA 7 VT mor stable P CRRT, follow urine, cont net neg stop furosemide    LOS: 7 days   Larry [name] L 09/01/2014,6:05 AM

## 2014-09-01 NOTE — Progress Notes (Signed)
[facility] PROGRESS NOTE  Larry [name] HKV:[id number] DOB: Mar 06, 1950 DOA: 08/25/2014 PCP: [name], MD  Assessment/Plan:  1. Severe nonischemic cardiomyopathy with chronic systolic heart failure with an EF of 25%. History of V. tach. Patient has AICD, on home milrinone drip -Patient having a 4 kg weight gain since 08/26/2014, his weight increasing to 106.1 kg from 102kg -Patient was started on CVVHD on 08/31/2014,  transfer to intensive care unit. -Milrinone was stopped overnight, restarted this morning -Labs showing downward trend in creatinine, sodium improved to 128 from 123  2.  Acute on chronic renal failure -Labs showing ongoing upward trend in his creatinine, rising to 5.9 from 4.07, a.m. labs also showing a bicarbonate of 18 with potassium of 5.4. He was started on IV Lasix yesterday by nephrology at 160 mg IV every 6 hours. Despite this intervention is had little urinary output. -Xarelto was discontinued and started on IV heparin -Nephrology was consulted yesterday, feeling renal failure likely represents ATN/hypoperfusion -CRRT was started on 08/31/2014 -Cr trending down to 3.36 from 4.93 -Nephrology following   3. Nausea/vomiting -Patient having episode of nausea vomiting is secondary to postoperative ileus -Patient reporting significant improvement after having bowel movement. -He is on Mira lax, Colace, lactulose and suppository for constipation  4.  Hyponatremia -Labs showing downward trend and sodium to 124 -I suspect secondary to volume overload -Since starting CRRT sodium trending trending up to 128  5. Mechanical trip and fall with left femoral intertrochanteric fracture.  -status post intramedullary nail to left hip on 7/19 -WBAT -PT/OT evaluation -Plan for discharge to SNF when medically stable  6. Paroxysmal atrial fibrillation.  -Xarelto discontinued due to worsening kidney function, now on IV heparin -On Amio  7. Hyponatremia.   -Sodium improved  8. Obstructive sleep apnea.  -Patient refusing BiPAP  9. DM type II. recent A1c of 7.7 -continue Lantus and SSI -Blood sugars remaining in the 100 range, on Lantus 15 units subcutaneous twice a day   Code Status: Full Family Communication: wife at bedside Disposition Plan: Patient was transferred to [facility] for closer monitoring   Consultants:  Ortho (Dr. Percell Miller)  HF team   Nephrology  Procedures:  S/P intramedullary nail on left hip on 7/18  Antibiotics:  None   HPI/Subjective: Patient states feeling a little better today. He has not had a BM in the last 24 hours. Denies SOB or CP  Objective: Filed Vitals:   09/01/14 1100  BP: 106/37  Pulse: 82  Temp:   Resp: 21    Intake/Output Summary (Last 24 hours) at 09/01/14 1130 Last data filed at 09/01/14 1100  Gross per 24 hour  Intake 807.92 ml  Output   1017 ml  Net -209.08 ml   Filed Weights   08/30/14 0400 08/31/14 0346 09/01/14 0300  Weight: 106.595 kg (235 lb) 106.187 kg (234 lb 1.6 oz) 105.3 kg (232 lb 2.3 oz)    Exam:   General:  He is awake and alert, oriented, follows commands now  Cardiovascular:norubs, no gallops, RRR  Respiratory: Has bibasilar crackles, no wheezing rhonchi, normal respiratory effort  Abdomen: soft, NT, ND, positive BS Musculoskeletal: Surgical incision site appears clean, no evidence of infection. Has 1-2+ bilateral lower extremity pitting edema Data Reviewed: Basic Metabolic Panel:  Recent Labs Lab 08/26/14 0605  08/30/14 0410 08/30/14 1501 08/31/14 0347 08/31/14 1612 08/31/14 2345 09/01/14 0425 09/01/14 0426  NA 129*  < > 124* 124* 124* 123*  --   --  128*  K  4.6  < > 4.9 5.1 5.4* 4.9 4.8  --  4.9  CL 95*  < > 91* 90* 91* 90*  --   --  95*  CO2 25  < > 20* 20* 18* 19*  --   --  23  GLUCOSE 172*  < > 262* 275* 215* 246*  --   --  122*  BUN 47*  < > 79* 89* 97* 78*  --   --  47*  CREATININE 2.29*  < > 4.07* 5.11* 5.90* 4.93*  --   --  3.36*   CALCIUM 8.6*  < > 8.6* 8.5* 8.5* 8.0*  --   --  8.1*  MG 2.1  --   --   --   --   --  2.5* 2.3  --   PHOS  --   --   --  6.2* 7.5* 6.3*  --   --  4.4  < > = values in this interval not displayed. CBC:  Recent Labs Lab 08/25/14 1458  08/29/14 0500 08/30/14 0410 08/30/14 1744 08/31/14 0347 09/01/14 0425  WBC 8.5  < > 6.8 6.8 7.5 7.1 6.0  NEUTROABS 6.8  --   --   --  5.4  --   --   HGB 9.9*  < > 9.6* 9.8* 9.2* 9.2* 9.2*  HCT 30.7*  < > 29.5* 29.4* 28.5* 28.2* 28.1*  MCV 95.6  < > 93.4 93.3 94.1 94.3 94.6  PLT 241  < > 240 271 258 274 258  < > = values in this interval not displayed. BNP (last 3 results)  Recent Labs  05/22/14 1029 05/27/14 1126 06/19/14 1030  BNP 935.2* 1627.8* 738.9*    ProBNP (last 3 results)  Recent Labs  10/09/13 0926 11/14/13 0926 01/14/14 1112  PROBNP 1561.0* 1261.0* 5769.0*   CBG:  Recent Labs Lab 08/31/14 0742 08/31/14 1205 08/31/14 1605 08/31/14 2138 09/01/14 0759  GLUCAP 192* 241* 217* 172* 98    Recent Results (from the past 240 hour(s))  Urine culture     Status: None   Collection Time: 08/25/14  4:12 PM  Result Value Ref Range Status   Specimen Description URINE, CLEAN CATCH  Final   Special Requests NONE  Final   Culture NO GROWTH 1 DAY  Final   Report Status 08/26/2014 FINAL  Final     Studies: US Renal  08/31/2014   CLINICAL DATA:  Current history of hypertension and diabetes, presenting with acute kidney injury and oliguria.  EXAM: RENAL / URINARY TRACT ULTRASOUND COMPLETE  COMPARISON:  Unenhanced CT abdomen and pelvis 05/27/2014.  FINDINGS: Right Kidney:  Length: Approximately 11.1 cm. No hydronephrosis. Well-preserved cortex. No shadowing calculi. Normal parenchymal echotexture. No focal parenchymal abnormality.  Left Kidney:  Length: Approximate 1.5 cm. No hydronephrosis. Well-preserved cortex. No shadowing calculi. Normal parenchymal echotexture. Exophytic 1.8 x 2.2 x 1.7 cm hypoechoic mass with acoustic enhancement  and no internal color Doppler flow, shown on the prior CT to represent a hemorrhagic cyst. No significant focal parenchymal abnormality.  Bladder:  Decompressed by Foley catheter.  IMPRESSION: No significant abnormalities. Approximate 2 cm cyst arising from the lower pole of the left kidney, unchanged from the CT in April, 2016.   Electronically Signed   By: Evangeline Dakin M.D.   On: 08/31/2014 17:04   Dg Chest Port 1 View  08/31/2014   CLINICAL DATA:  Status post central line placement  EXAM: PORTABLE CHEST - 1 VIEW  COMPARISON:  08/29/2014  FINDINGS: Cardiac shadow remains enlarged. A pacing device is again seen a right-sided PICC line is again noted in distal superior vena cava. A new right jugular central line has been placed an lies within the distal superior vena cava. No pneumothorax is noted. No focal infiltrate is seen. No bony abnormality is noted.  IMPRESSION: Status post jugular central line placement in satisfactory position. Remainder of the exam is stable from the previous study   Electronically Signed   By: Inez Catalina M.D.   On: 08/31/2014 09:37    Scheduled Meds: . sodium chloride   Intravenous Once  . sodium chloride  10 mL/hr Intravenous Once  . ALPRAZolam  0.5 mg Oral QHS  . amiodarone  200 mg Oral BID  . antiseptic oral rinse  7 mL Mouth Rinse BID  . atorvastatin  40 mg Oral q1800  . bisacodyl  5 mg Oral QHS  . bisacodyl  10 mg Rectal Once  . docusate sodium  100 mg Oral BID  . DULoxetine  30 mg Oral Daily  . insulin aspart  0-9 Units Subcutaneous TID AC & HS  . insulin glargine  15 Units Subcutaneous BID  . lactulose  20 g Oral Once  . levothyroxine  75 mcg Oral QAC breakfast  . polyethylene glycol  17 g Oral Daily  . sildenafil  80 mg Oral TID   Continuous Infusions: . sodium chloride 10 mL/hr at 08/29/14 0543  . heparin 10,000 units/ 20 mL infusion syringe 1,500 Units/hr (09/01/14 1100)  . heparin 850 Units/hr (09/01/14 0800)  . milrinone 0.25 mcg/kg/min  (09/01/14 0915)  . dialysis replacement fluid (prismasate) 700 mL/hr at 09/01/14 0928  . dialysis replacement fluid (prismasate) 700 mL/hr at 09/01/14 0928  . dialysate (PRISMASATE) 1,500 mL/hr at 09/01/14 1121    Principal Problem:   Fracture, intertrochanteric, left femur Active Problems:   Essential hypertension, benign   Persistent atrial fibrillation   Automatic implantable cardioverter-defibrillator in situ   DM2 (diabetes mellitus, type 2)   OSA (obstructive sleep apnea), intolerant to CPAP/BIPAP   Ventricular tachycardia   Chronic systolic CHF (congestive heart failure)   Hip fracture   Left femoral shaft fracture   Acute on chronic renal failure   Hyponatremia    Time spent: 25 minutes   Kelvin Cellar  [facility] Pager [phone]. If 7PM-7AM, please contact night-coverage at [email or web address], password [facility] 09/01/2014, 11:30 AM  LOS: 7 days

## 2014-09-01 NOTE — Progress Notes (Signed)
ANTICOAGULATION CONSULT NOTE - Follow Up Consult  Pharmacy Consult for Heparin (while Xarelto on hold) Indication: atrial fibrillation  Allergies  Allergen Reactions  . Bydureon [Exenatide] Other (See Comments)    sweating  . Losartan Potassium Other (See Comments)    insomnia    Patient Measurements: Height: 5\' 10"  (177.8 cm) Weight: 232 lb 2.3 oz (105.3 kg) IBW/kg (Calculated) : 73 Heparin Dosing Weight: 96 kg  Vital Signs: Temp: 97.4 F (36.3 C) (07/24 1605) Temp Source: Oral (07/24 1605) BP: 106/41 mmHg (07/24 1800) Pulse Rate: 81 (07/24 1800)  Labs:  Recent Labs  08/30/14 1744 08/31/14 0347  08/31/14 1612  09/01/14 0425 09/01/14 0426 09/01/14 1406 09/01/14 1525 09/01/14 1620 09/01/14 1740  HGB 9.2* 9.2*  --   --   --  9.2*  --   --   --   --   --   HCT 28.5* 28.2*  --   --   --  28.1*  --   --   --   --   --   PLT 258 274  --   --   --  258  --   --   --   --   --   APTT  --  108*  < >  --   < >  --  139* >200* >200*  --  >200*  HEPARINUNFRC  --  >2.20*  --   --   --  >2.20*  --   --   --   --   --   CREATININE  --  5.90*  --  4.93*  --   --  3.36*  --   --  1.97*  --   < > = values in this interval not displayed.  Estimated Creatinine Clearance: 47.2 mL/min (by C-G formula based on Cr of 1.97).   Medications:  Heparin @ 850 units/hr (8.5 ml/hr)  Assessment: 64 YOM on Xarelto PTA for hx Afib - held in the setting of AKI requiring CRRT and transitioned to heparin. The patient is noted to have both a PICC line and a RIJ HD catheter. Heparin is infusing in the PICC line and the aPTT labs are being drawn from the RIJ - both of which are being pulled from the central blood flow. 3 aPTT labs have been drawn on this patient today - the 1st from the CRRT machine pre-pump, the 2nd from the RIJ after holding heparin for 5 mins and flushing line, and the 3rd from RIJ again after holding heparin for 15 mins and flushing line. All of these levels have resulted as  SUPRAtherapeutic with aPTT>200. Given the patient's weight and heparin rate, it is very unlikely that the rate is too high for him currently - the elevated levels are likely related to the site where the labs are being drawn.   Anticoagulation and labs draws were discussed with Dr. Coralyn Pear - will attempt with the next lab draw to get a peripheral stick though the patient is edematous and this may not be possible. In the meantime, will reduce the drip rate slightly but not aggressively.   Goal of Therapy:  Heparin level 0.3-0.7 units/ml aPTT 66-102 seconds Monitor platelets by anticoagulation protocol: Yes   Plan:  1. Reduce Heparin to 700 units/hr (7 ml/hr) 2. Will continue to monitor for any signs/symptoms of bleeding and will follow up with heparin level in 8 hours   Alycia Rossetti, PharmD, BCPS Clinical Pharmacist Pager: [phone] 09/01/2014 6:44 PM

## 2014-09-01 NOTE — Progress Notes (Signed)
ANTICOAGULATION CONSULT NOTE - Follow Up Consult  Pharmacy Consult for Heparin (xarelto on hold) Indication: atrial fibrillation  Allergies  Allergen Reactions  . Bydureon [Exenatide] Other (See Comments)    sweating  . Losartan Potassium Other (See Comments)    insomnia    Patient Measurements: Height: 5\' 10"  (177.8 cm) Weight: 232 lb 2.3 oz (105.3 kg) IBW/kg (Calculated) : 73   Vital Signs: Temp: 98 F (36.7 C) (07/24 0430) Temp Source: Oral (07/24 0430) BP: 107/38 mmHg (07/24 0600) Pulse Rate: 81 (07/24 0600)  Labs:  Recent Labs  08/30/14 1744  08/31/14 0347 08/31/14 1520 08/31/14 1612 08/31/14 1810 09/01/14 0425 09/01/14 0426  HGB 9.2*  --  9.2*  --   --   --  9.2*  --   HCT 28.5*  --  28.2*  --   --   --  28.1*  --   PLT 258  --  274  --   --   --  258  --   APTT  --   < > 108* >200*  --  118*  --  139*  HEPARINUNFRC  --   --  >2.20*  --   --   --  >2.20*  --   CREATININE  --   --  5.90*  --  4.93*  --   --  3.36*  < > = values in this interval not displayed.  Estimated Creatinine Clearance: 27.7 mL/min (by C-G formula based on Cr of 3.36).  Assessment: Xarelto held due to renal failure, aPTT is supra-therapeutic this AM at 139 (using aPTT to dose for now given Xarelto influence on anti-Xa level). No issues per RN. Heparin level drawn from different access than heparin infusion.   Goal of Therapy:  Heparin level 0.3-0.7 units/ml aPTT 66-102 seconds Monitor platelets by anticoagulation protocol: Yes   Plan:  -Decrease heparin to 850 units/hr -Check 1430 aPTT -Daily CBC/aPTT/HL -Monitor for bleeding  Narda Bonds 09/01/2014,6:24 AM

## 2014-09-01 NOTE — Progress Notes (Signed)
Pt still w/ frequent PVCs-Dr Turner came by to see patients w/ order to stop milrinone for now and reevaluate later for possible use of pressor w/ amio gtt.

## 2014-09-01 NOTE — Progress Notes (Addendum)
Advanced Heart Failure Rounding Note   Subjective:    Mr. Meyn is a 64 y/o man with history of chronic systolic CHF (nonischemic cardiomyopathy) s/p 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 He is on chronic milrinone 0.5 mcg.  Prior to admit he was being considered for LVAD.   Dischaged 7/16 after dental procedures. Also had RHC with elevate pulmonary pressures. Revatio was increased to 80 mg tid during that stay.  Admitted with fall---> broken L  hip . 7/18 had IM Nail L femur.  7/23 started CVVHD  Tolerating CVVHD. More alert but somewhat confused. Had frequent NSVT overnight. Milrinone stopped by Dr. Radford Pax. Remains weak. Weight down 2 pounds. Remains anuric. CVP 16. Co-ox 79%    Objective:   Weight Range:  Vital Signs:   Temp:  [96 F (35.6 C)-98.1 F (36.7 C)] 98.1 F (36.7 C) (07/24 0748) Pulse Rate:  [50-95] 82 (07/24 0800) Resp:  [13-24] 15 (07/24 0800) BP: (72-116)/(23-50) 107/38 mmHg (07/24 0800) SpO2:  [92 %-100 %] 97 % (07/24 0800) Weight:  [105.3 kg (232 lb 2.3 oz)] 105.3 kg (232 lb 2.3 oz) (07/24 0300) Last BM Date: 08/29/14  Weight change: Filed Weights   08/30/14 0400 08/31/14 0346 09/01/14 0300  Weight: 106.595 kg (235 lb) 106.187 kg (234 lb 1.6 oz) 105.3 kg (232 lb 2.3 oz)    Intake/Output:   Intake/Output Summary (Last 24 hours) at 09/01/14 4166 Last data filed at 09/01/14 0800  Gross per 24 hour  Intake 635.39 ml  Output   1118 ml  Net -482.61 ml     Physical Exam: General:  In chair Wife at bed side.  HEENT: normal Neck: supple. JVP jaw. RIJ trialysis catheter.  Carotids 2+ bilat; no bruits. No lymphadenopathy or thryomegaly appreciated. Cor: PMI nondisplaced. Regular rate & rhythm. No rubs, gallops or murmurs. Lungs: Diminished both bases with mild crackles Abdomen: soft, nontender, nondistended. No hepatosplenomegaly. No bruits or masses. Good bowel  sounds. Extremities: no cyanosis, clubbing, rash. RUE PICC dual lumen . R Thigh lateral aspect dressing intact x2. 2+ edema Neuro: Awake. Lethargic but conversant,  Cranial nerves grossly intact. Affect pleasant. Moves extremities without difficulty. L leg tender.  Telemetry: NSR   Labs: Basic Metabolic Panel:  Recent Labs Lab 08/26/14 0605  08/30/14 0410 08/30/14 1501 08/31/14 0347 08/31/14 1612 08/31/14 2345 09/01/14 0425 09/01/14 0426  NA 129*  < > 124* 124* 124* 123*  --   --  128*  K 4.6  < > 4.9 5.1 5.4* 4.9 4.8  --  4.9  CL 95*  < > 91* 90* 91* 90*  --   --  95*  CO2 25  < > 20* 20* 18* 19*  --   --  23  GLUCOSE 172*  < > 262* 275* 215* 246*  --   --  122*  BUN 47*  < > 79* 89* 97* 78*  --   --  47*  CREATININE 2.29*  < > 4.07* 5.11* 5.90* 4.93*  --   --  3.36*  CALCIUM 8.6*  < > 8.6* 8.5* 8.5* 8.0*  --   --  8.1*  MG 2.1  --   --   --   --   --  2.5* 2.3  --   PHOS  --   --   --  6.2* 7.5* 6.3*  --   --  4.4  < > =  values in this interval not displayed.  Liver Function Tests:  Recent Labs Lab 08/30/14 1501 08/31/14 0347 08/31/14 1612 09/01/14 0426  ALBUMIN 3.0* 3.1* 3.2* 3.1*   No results for input(s): LIPASE, AMYLASE in the last 168 hours. No results for input(s): AMMONIA in the last 168 hours.  CBC:  Recent Labs Lab 08/25/14 1458  08/29/14 0500 08/30/14 0410 08/30/14 1744 08/31/14 0347 09/01/14 0425  WBC 8.5  < > 6.8 6.8 7.5 7.1 6.0  NEUTROABS 6.8  --   --   --  5.4  --   --   HGB 9.9*  < > 9.6* 9.8* 9.2* 9.2* 9.2*  HCT 30.7*  < > 29.5* 29.4* 28.5* 28.2* 28.1*  MCV 95.6  < > 93.4 93.3 94.1 94.3 94.6  PLT 241  < > 240 271 258 274 258  < > = values in this interval not displayed.  Cardiac Enzymes: No results for input(s): CKTOTAL, CKMB, CKMBINDEX, TROPONINI in the last 168 hours.  BNP: BNP (last 3 results)  Recent Labs  05/22/14 1029 05/27/14 1126 06/19/14 1030  BNP 935.2* 1627.8* 738.9*    ProBNP (last 3 results)  Recent Labs   10/09/13 0926 11/14/13 0926 01/14/14 1112  PROBNP 1561.0* 1261.0* 5769.0*      Other results:  Imaging: US Renal  08/31/2014   CLINICAL DATA:  Current history of hypertension and diabetes, presenting with acute kidney injury and oliguria.  EXAM: RENAL / URINARY TRACT ULTRASOUND COMPLETE  COMPARISON:  Unenhanced CT abdomen and pelvis 05/27/2014.  FINDINGS: Right Kidney:  Length: Approximately 11.1 cm. No hydronephrosis. Well-preserved cortex. No shadowing calculi. Normal parenchymal echotexture. No focal parenchymal abnormality.  Left Kidney:  Length: Approximate 1.5 cm. No hydronephrosis. Well-preserved cortex. No shadowing calculi. Normal parenchymal echotexture. Exophytic 1.8 x 2.2 x 1.7 cm hypoechoic mass with acoustic enhancement and no internal color Doppler flow, shown on the prior CT to represent a hemorrhagic cyst. No significant focal parenchymal abnormality.  Bladder:  Decompressed by Foley catheter.  IMPRESSION: No significant abnormalities. Approximate 2 cm cyst arising from the lower pole of the left kidney, unchanged from the CT in April, 2016.   Electronically Signed   By: Evangeline Dakin M.D.   On: 08/31/2014 17:04   Dg Chest Port 1 View  08/31/2014   CLINICAL DATA:  Status post central line placement  EXAM: PORTABLE CHEST - 1 VIEW  COMPARISON:  08/29/2014  FINDINGS: Cardiac shadow remains enlarged. A pacing device is again seen a right-sided PICC line is again noted in distal superior vena cava. A new right jugular central line has been placed an lies within the distal superior vena cava. No pneumothorax is noted. No focal infiltrate is seen. No bony abnormality is noted.  IMPRESSION: Status post jugular central line placement in satisfactory position. Remainder of the exam is stable from the previous study   Electronically Signed   By: Inez Catalina M.D.   On: 08/31/2014 09:37     Medications:     Scheduled Medications: . sodium chloride   Intravenous Once  . sodium  chloride  10 mL/hr Intravenous Once  . ALPRAZolam  0.5 mg Oral QHS  . amiodarone  200 mg Oral Daily  . antiseptic oral rinse  7 mL Mouth Rinse BID  . atorvastatin  40 mg Oral q1800  . bisacodyl  5 mg Oral QHS  . bisacodyl  10 mg Rectal Once  . docusate sodium  100 mg Oral BID  . DULoxetine  30 mg  Oral Daily  . insulin aspart  0-9 Units Subcutaneous TID AC & HS  . insulin glargine  15 Units Subcutaneous BID  . lactulose  20 g Oral Once  . levothyroxine  75 mcg Oral QAC breakfast  . polyethylene glycol  17 g Oral Daily  . sildenafil  80 mg Oral TID    Infusions: . sodium chloride 10 mL/hr at 08/29/14 0543  . heparin 10,000 units/ 20 mL infusion syringe 1,300 Units/hr (09/01/14 0807)  . heparin 850 Units/hr (09/01/14 0800)  . milrinone    . dialysis replacement fluid (prismasate) 700 mL/hr at 09/01/14 0208  . dialysis replacement fluid (prismasate) 700 mL/hr at 09/01/14 0208  . dialysate (PRISMASATE) 1,500 mL/hr at 09/01/14 0425    PRN Medications: albuterol, ALPRAZolam **AND** ALPRAZolam, heparin, heparin, morphine injection, ondansetron (ZOFRAN) IV, oxyCODONE-acetaminophen **AND** oxyCODONE, polyethylene glycol, sodium chloride, sodium chloride, zolpidem   Assessment:  1. Left Hip Fracture s/p IM nail on 08/26/14 2. Chronic systolic CHF- on home milrinone 0.25mcg 3. Acute on chronic renal failure - suspect post-op ATN  4.  Paroxysmal atrial fibrillation requiring DCCV 02/2014, 04/2014- on chronic amio and xarelto. CHADS VASc Score- 5 5. Diabetes mellitus type 2 6. Depression/anxiety 7. OSA, complex- Needs Bipap at night but had difficulty after dental procedure.  8 Pulmonary HTN - Mixed pulmonary venous hypertension and PAH- on  9. Dental extractions 08/2014 by Dr Enrique Sack 10. Obesity Body mass index is 32.39 kg/(m^2). 11. Probable chronic respiratory failure - requiring home O2 with ambulation as of this admission 12. Hyponatremia -  13. NSVT  Plan/Discussion:    Now on  CVVHD. CVP remains elevated. Fluid removal at 30/hr. Hopefully we can increase as BP tolerates. Appreciate Renal input.  Will restart milrinone at 0.25 (was on 0.5) and see how he tolerates. Co-ox 80% K and Mag ok. Increase amio to 200 bid.   Sinus Rhythm with PVCs. Long PR interval noted on EKG . Continue amio. Off Metoprolol. Off Xarelto due to renal failure. Now on heparin. Pharmacy following.   The patient is critically ill with multiple organ systems failure and requires high complexity decision making for assessment and support, frequent evaluation and titration of therapies, application of advanced monitoring technologies and extensive interpretation of multiple databases.   Critical Care Time devoted to patient care services described in this note is 35 Minutes.   Length of Stay: 7  Surie Suchocki,MD 9:07 AM Advanced Heart Failure Team Pager [phone] (M-F; 7a - 4p)  Please contact [facility] for night-coverage after hours (4p -7a ) and weekends on [email or web address]

## 2014-09-02 ENCOUNTER — Encounter: Payer: Self-pay | Admitting: Internal Medicine

## 2014-09-02 DIAGNOSIS — I5023 Acute on chronic systolic (congestive) heart failure: Secondary | ICD-10-CM

## 2014-09-02 DIAGNOSIS — K08109 Complete loss of teeth, unspecified cause, unspecified class: Secondary | ICD-10-CM

## 2014-09-02 DIAGNOSIS — S72142D Displaced intertrochanteric fracture of left femur, subsequent encounter for closed fracture with routine healing: Secondary | ICD-10-CM

## 2014-09-02 LAB — RENAL FUNCTION PANEL
ALBUMIN: 2.9 g/dL — AB (ref 3.5–5.0)
Albumin: 2.7 g/dL — ABNORMAL LOW (ref 3.5–5.0)
Anion gap: 7 (ref 5–15)
Anion gap: 6 (ref 5–15)
BUN: 19 mg/dL (ref 6–20)
BUN: 13 mg/dL (ref 6–20)
CHLORIDE: 98 mmol/L — AB (ref 101–111)
CO2: 26 mmol/L (ref 22–32)
CO2: 27 mmol/L (ref 22–32)
Calcium: 8 mg/dL — ABNORMAL LOW (ref 8.9–10.3)
Calcium: 8.2 mg/dL — ABNORMAL LOW (ref 8.9–10.3)
Chloride: 101 mmol/L (ref 101–111)
Creatinine, Ser: 2.12 mg/dL — ABNORMAL HIGH (ref 0.61–1.24)
Creatinine, Ser: 1.81 mg/dL — ABNORMAL HIGH (ref 0.61–1.24)
GFR calc Af Amer: 45 mL/min — ABNORMAL LOW (ref >60)
GFR, EST AFRICAN AMERICAN: 37 mL/min — AB (ref >60)
GFR, EST NON AFRICAN AMERICAN: 32 mL/min — AB (ref >60)
GFR, EST NON AFRICAN AMERICAN: 38 mL/min — AB (ref >60)
GLUCOSE: 174 mg/dL — AB (ref 65–99)
Glucose, Bld: 123 mg/dL — ABNORMAL HIGH (ref 65–99)
PHOSPHORUS: 3.4 mg/dL (ref 2.5–4.6)
POTASSIUM: 3.9 mmol/L (ref 3.5–5.1)
Phosphorus: 2.4 mg/dL — ABNORMAL LOW (ref 2.5–4.6)
Potassium: 4.4 mmol/L (ref 3.5–5.1)
SODIUM: 134 mmol/L — AB (ref 135–145)
Sodium: 131 mmol/L — ABNORMAL LOW (ref 135–145)

## 2014-09-02 LAB — CBC
HCT: 29.1 % — ABNORMAL LOW (ref 39.0–52.0)
Hemoglobin: 9.3 g/dL — ABNORMAL LOW (ref 13.0–17.0)
MCH: 31.5 pg (ref 26.0–34.0)
MCHC: 32 g/dL (ref 30.0–36.0)
MCV: 98.6 fL (ref 78.0–100.0)
Platelets: 258 10*3/uL (ref 150–400)
RBC: 2.95 MIL/uL — AB (ref 4.22–5.81)
RDW: 16.3 % — AB (ref 11.5–15.5)
WBC: 7.5 10*3/uL (ref 4.0–10.5)

## 2014-09-02 LAB — GLUCOSE, CAPILLARY
GLUCOSE-CAPILLARY: 106 mg/dL — AB (ref 65–99)
Glucose-Capillary: 92 mg/dL (ref 65–99)
Glucose-Capillary: 97 mg/dL (ref 65–99)
Glucose-Capillary: 84 mg/dL (ref 65–99)

## 2014-09-02 LAB — POCT ACTIVATED CLOTTING TIME
ACTIVATED CLOTTING TIME: 183 s
ACTIVATED CLOTTING TIME: 189 s
ACTIVATED CLOTTING TIME: 190 s
ACTIVATED CLOTTING TIME: 190 s
ACTIVATED CLOTTING TIME: 189 s
ACTIVATED CLOTTING TIME: 190 s
ACTIVATED CLOTTING TIME: 177 s
Activated Clotting Time: 190 seconds
Activated Clotting Time: 190 seconds
Activated Clotting Time: 177 seconds
Activated Clotting Time: 183 seconds
Activated Clotting Time: 177 seconds
Activated Clotting Time: 177 seconds

## 2014-09-02 LAB — APTT: aPTT: >200 seconds (ref 24–37)

## 2014-09-02 LAB — HEPARIN LEVEL (UNFRACTIONATED): Heparin Unfractionated: 1.5 IU/mL — ABNORMAL HIGH (ref 0.30–0.70)

## 2014-09-02 LAB — CARBOXYHEMOGLOBIN
Carboxyhemoglobin: 1.3 % (ref 0.5–1.5)
Methemoglobin: 0.8 % (ref 0.0–1.5)
O2 Saturation: 72.5 %
TOTAL HEMOGLOBIN: 9.8 g/dL — AB (ref 13.5–18.0)

## 2014-09-02 LAB — MAGNESIUM: MAGNESIUM: 2.4 mg/dL (ref 1.7–2.4)

## 2014-09-02 MED ORDER — SODIUM PHOSPHATE 3 MMOLE/ML IV SOLN
20.0000 mmol | Freq: Once | INTRAVENOUS | Status: AC
  Administered 2014-09-02: 20 mmol via INTRAVENOUS
  Filled 2014-09-02: qty 6.67

## 2014-09-02 MED ORDER — BISACODYL 10 MG RE SUPP
10.0000 mg | Freq: Once | RECTAL | Status: AC
  Administered 2014-09-02: 10 mg via RECTAL

## 2014-09-02 MED ORDER — CHLORHEXIDINE GLUCONATE 0.12 % MT SOLN
15.0000 mL | Freq: Two times a day (BID) | OROMUCOSAL | Status: DC
  Administered 2014-09-02 – 2014-09-21 (×33): 15 mL via OROMUCOSAL
  Filled 2014-09-02 (×32): qty 15

## 2014-09-02 MED ORDER — POLYETHYLENE GLYCOL 3350 17 G PO PACK: 17.0000 g | PACK | Freq: Every day | ORAL | Status: DC | PRN

## 2014-09-02 MED ORDER — MILK AND MOLASSES ENEMA
1.0000 | Freq: Once | RECTAL | Status: DC
  Filled 2014-09-02: qty 250

## 2014-09-02 MED ORDER — CHLORHEXIDINE GLUCONATE 0.12 % MT SOLN: 15.0000 mL | Freq: Two times a day (BID) | OROMUCOSAL | Status: DC

## 2014-09-02 MED ORDER — LACTULOSE 10 GM/15ML PO SOLN
20.0000 g | Freq: Two times a day (BID) | ORAL | Status: DC
Start: 2014-09-02 — End: 2014-09-03
  Administered 2014-09-02: 20 g via ORAL
  Filled 2014-09-02 (×2): qty 30

## 2014-09-02 MED ORDER — ZOLPIDEM TARTRATE 5 MG PO TABS
5.0000 mg | ORAL_TABLET | Freq: Every evening | ORAL | Status: DC | PRN
  Administered 2014-09-04 – 2014-09-05 (×2): 5 mg via ORAL
  Filled 2014-09-02 (×2): qty 1

## 2014-09-02 MED ORDER — INSULIN ASPART 100 UNIT/ML ~~LOC~~ SOLN
0.0000 [IU] | Freq: Three times a day (TID) | SUBCUTANEOUS | Status: DC
Start: 2014-09-02 — End: 2014-10-11
  Administered 2014-09-04: 9 [IU] via SUBCUTANEOUS
  Administered 2014-09-04: 2 [IU] via SUBCUTANEOUS
  Administered 2014-09-05 – 2014-09-07 (×6): 1 [IU] via SUBCUTANEOUS
  Administered 2014-09-08: 2 [IU] via SUBCUTANEOUS
  Administered 2014-09-09 – 2014-09-15 (×7): 1 [IU] via SUBCUTANEOUS
  Administered 2014-09-15 – 2014-09-16 (×4): 2 [IU] via SUBCUTANEOUS
  Administered 2014-09-16: 1 [IU] via SUBCUTANEOUS
  Administered 2014-09-16 – 2014-09-17 (×5): 2 [IU] via SUBCUTANEOUS
  Administered 2014-09-17 – 2014-09-18 (×3): 1 [IU] via SUBCUTANEOUS
  Administered 2014-09-18: 2 [IU] via SUBCUTANEOUS
  Administered 2014-09-19 (×2): 1 [IU] via SUBCUTANEOUS
  Administered 2014-09-19: 2 [IU] via SUBCUTANEOUS
  Administered 2014-09-20 – 2014-09-24 (×8): 1 [IU] via SUBCUTANEOUS
  Administered 2014-09-24: 2 [IU] via SUBCUTANEOUS
  Administered 2014-09-24 – 2014-09-25 (×2): 1 [IU] via SUBCUTANEOUS
  Administered 2014-09-26: 2 [IU] via SUBCUTANEOUS
  Administered 2014-09-26 – 2014-09-29 (×4): 1 [IU] via SUBCUTANEOUS
  Administered 2014-10-03: 2 [IU] via SUBCUTANEOUS
  Administered 2014-10-05 – 2014-10-10 (×8): 1 [IU] via SUBCUTANEOUS
  Administered 2014-10-10: 2 [IU] via SUBCUTANEOUS
  Administered 2014-10-11 (×2): 1 [IU] via SUBCUTANEOUS

## 2014-09-02 MED ORDER — SODIUM CHLORIDE 0.9 % IR SOLN
200.0000 mL | Status: DC
  Administered 2014-09-02 – 2014-09-05 (×23): 200 mL
  Administered 2014-09-05: 16:00:00
  Administered 2014-09-06 – 2014-09-16 (×51): 200 mL

## 2014-09-02 MED ORDER — SODIUM CHLORIDE 0.9 % IR SOLN
200.0000 mL | Status: DC
  Administered 2014-09-06 – 2014-09-11 (×7): 200 mL

## 2014-09-02 MED ORDER — HEPARIN (PORCINE) IN NACL 100-0.45 UNIT/ML-% IJ SOLN
600.0000 [IU]/h | INTRAMUSCULAR | Status: DC
  Filled 2014-09-02: qty 250

## 2014-09-02 NOTE — Progress Notes (Signed)
Pt requested that suppository and laxative be given in another couple of hours as he wants to sleep some more.

## 2014-09-02 NOTE — Progress Notes (Signed)
ANTICOAGULATION CONSULT NOTE - Follow Up Consult  Pharmacy Consult for Heparin (while Xarelto on hold) Indication: atrial fibrillation  Allergies  Allergen Reactions  . Bydureon [Exenatide] Other (See Comments)    sweating  . Losartan Potassium Other (See Comments)    insomnia    Patient Measurements: Height: 5\' 10"  (177.8 cm) Weight: 235 lb 14.3 oz (107 kg) IBW/kg (Calculated) : 73 Heparin Dosing Weight: 96 kg  Vital Signs: Temp: 98 F (36.7 C) (07/25 1500) Temp Source: Oral (07/25 1500) BP: 101/46 mmHg (07/25 1500) Pulse Rate: 75 (07/25 1500)  Labs:  Recent Labs  08/31/14 0347  09/01/14 0425 09/01/14 0426  09/01/14 1620 09/01/14 1740 09/02/14 0400 09/02/14 0435 09/02/14 1415  HGB 9.2*  --  9.2*  --   --   --   --  9.3*  --   --   HCT 28.2*  --  28.1*  --   --   --   --  29.1*  --   --   PLT 274  --  258  --   --   --   --  258  --   --   APTT 108*  < >  --  139*  < >  --  >200*  --  >200* >200*  HEPARINUNFRC >2.20*  --  >2.20*  --   --   --   --   --  1.50*  --   CREATININE 5.90*  < >  --  3.36*  --  1.97*  --  2.12*  --   --   < > = values in this interval not displayed.  Estimated Creatinine Clearance: 44.3 mL/min (by C-G formula based on Cr of 2.12).   Medications:  Heparin @ 600 units/hr (6 ml/hr)  Assessment: 64 YOM on Xarelto PTA for hx Afib - held in the setting of AKI requiring CRRT and transitioned to heparin. The patient is noted to have both a PICC line and a RIJ HD catheter. Heparin is infusing in the PICC line and the aPTT labs are being drawn from the RIJ - both of which are being pulled from the central blood flow. Not able to draw from peripheral site 2/2 edema.  APTT drawn from RIJ this afternoon remains >200 despite multiple rate decreases. At this point, will discontinue heparin gtt and reassess am HL/aPTT. If levels back to normal, will restart heparin gtt cautiously.   Goal of Therapy:  Heparin level 0.3-0.7 units/ml aPTT 66-102  seconds Monitor platelets by anticoagulation protocol: Yes   Plan:  - Hold heparin gtt - F/u am HL/aPTT - Monitor for bleeding  Brecklynn Jian K. Velva Harman, PharmD, [place] Clinical Pharmacist Pager: [phone] Phone: [phone] 09/02/2014 4:09 PM

## 2014-09-02 NOTE — Progress Notes (Signed)
CRITICAL VALUE ALERT  Critical value received:  APTT >200  Date of notification:  09/02/2014  Time of notification: 1031  Critical value read back:Yes.    Nurse who received alert:  Mendel Binsfeld GUFFEY RN   PHARMACIST MADE AWARE

## 2014-09-02 NOTE — Progress Notes (Signed)
POST OPERATIVE NOTE:  09/02/2014   Larry [name] [id number]  VITALS: BP 103/43 mmHg  Pulse 80  Temp(Src) 97.4 F (36.3 C) (Oral)  Resp 17  Ht 5\' 10"  (1.778 m)  Wt 235 lb 14.3 oz (107 kg)  BMI 33.85 kg/m2  SpO2 95%  LABS:  Lab Results  Component Value Date   WBC 7.5 09/02/2014   HGB 9.3* 09/02/2014   HCT 29.1* 09/02/2014   MCV 98.6 09/02/2014   PLT 258 09/02/2014   BMET    Component Value Date/Time   NA 131* 09/02/2014 0400   K 4.4 09/02/2014 0400   CL 98* 09/02/2014 0400   CO2 26 09/02/2014 0400   GLUCOSE 174* 09/02/2014 0400   BUN 19 09/02/2014 0400   CREATININE 2.12* 09/02/2014 0400   CALCIUM 8.0* 09/02/2014 0400   GFRNONAA 32* 09/02/2014 0400   GFRAA 37* 09/02/2014 0400    Lab Results  Component Value Date   INR 1.61* 08/25/2014   INR 2.69* 05/28/2014   INR 2.08* 05/08/2014   No results found for: PTT   Larry [name] is status post multiple extractions with alveoloplasty and pre-prosthetic surgery as indicated in the general anesthesia on 08/22/2014. Patient subsequently fell while at home and suffered a left femoral intratrochanteric fracture. The patient is status post intramedullary nail to the left hip on 08/27/2014.  SUBJECTIVE: Patient denies having any acute dental pain from the dental extraction sites. Patient indicates that multiple stitches are still present.  EXAM: There is no sign of oral infection, heme, or ooze. Sutures are loosely intact. Extraction sites are healing in by generalized primary closure but there are several molar extraction sites that are healing in by secondary intention.  ASSESSMENT: Post operative course is consistent with dental procedures performed in the operating room on 08/22/2014. The patient is now edentulous. There is atrophy of the edentulous alveolar ridges. Extraction sites appear to be healing in well.  PLAN: 1. Chlorhexidine gluconate rinses twice a day after breakfast and at bedtime. 2. Salt water  rinses every 2 hours while awake in between the chlorhexidine rinses 3. Advance diet as tolerated 4. Sutures to continue to dissolve on her own. 5. Return to dental medicine for evaluation of healing once medically stable.   Lenn Cal, DDS

## 2014-09-02 NOTE — Progress Notes (Signed)
[facility] PROGRESS NOTE  Larry [name] DPO:[id number] DOB: 11-20-1950 DOA: 08/25/2014 PCP: [name], MD  Assessment/Plan:  1. Severe nonischemic cardiomyopathy with chronic systolic heart failure with an EF of 25%. History of V. tach. Patient has AICD, on home milrinone drip -Patient having a 4 kg weight gain since 08/26/2014, his weight increasing to 106.1 kg from 102kg -Patient was started on CVVHD on 08/31/2014,  transfer to intensive care unit. -Cardiology managing milrinone -He is tolerating CRT  2.  Acute on chronic renal failure -Labs showing ongoing upward trend in his creatinine, rising to 5.9 from 4.07, a.m. labs also showing a bicarbonate of 18 with potassium of 5.4. He was started on IV Lasix yesterday by nephrology at 160 mg IV every 6 hours. Despite this intervention is had little urinary output. -Xarelto was discontinued and started on IV heparin -Nephrology was consulted yesterday, feeling renal failure likely represents postoperative ATN/hypoperfusion -CRRT was started on 08/31/2014 -A.m. labs showing creatinine of 2.12 -Has urine output of 68 mL overnight -Nephrology managing   3. Constipation -Patient having episode of nausea vomiting is secondary to postoperative ileus -Patient still has not had a bowel movement, will provide Miralax, Colace, Dulcolax suppository, lactulose, if he does not have a bowel movement with these agents can provide enema  4.  Hyponatremia -I suspect secondary to volume overload -Improved after starting CRT, sodium at 131  5. Mechanical trip and fall with left femoral intertrochanteric fracture.  -status post intramedullary nail to left hip on 7/19 -WBAT -PT/OT evaluation -Plan for discharge to SNF when medically stable  6. Paroxysmal atrial fibrillation.  -Xarelto discontinued due to worsening kidney function, now on IV heparin -On Amio  7. Obstructive sleep apnea.  -Patient refusing BiPAP  8. DM type II.  recent A1c of 7.7 -continue Lantus and SSI -Blood sugars remaining in the 100 range, on Lantus 15 units subcutaneous twice a day   Code Status: Full Family Communication: wife at bedside Disposition Plan: Patient was transferred to [facility] for closer monitoring   Consultants:  Ortho (Dr. Percell Miller)  HF team   Nephrology  Procedures:  S/P intramedullary nail on left hip on 7/18  Antibiotics:  None   HPI/Subjective: He denies chest pain or shortness of breath overall thinks he is feeling better, still has not had a bowel movement  Objective: Filed Vitals:   09/02/14 0700  BP: 103/43  Pulse: 80  Temp:   Resp: 17    Intake/Output Summary (Last 24 hours) at 09/02/14 0733 Last data filed at 09/02/14 0700  Gross per 24 hour  Intake 1115.88 ml  Output   1581 ml  Net -465.12 ml   Filed Weights   08/31/14 0346 09/01/14 0300 09/02/14 0500  Weight: 106.187 kg (234 lb 1.6 oz) 105.3 kg (232 lb 2.3 oz) 107 kg (235 lb 14.3 oz)    Exam:   General:  He is awake and alert, oriented, follows commands   Cardiovascular:norubs, no gallops, RRR  Respiratory: Normal respiratory effort, lungs are clear to auscultation bilaterally  Abdomen: soft, NT, ND, positive BS Musculoskeletal: Surgical incision site appears clean, no evidence of infection. Has 1-2+ bilateral lower extremity pitting edema Data Reviewed: Basic Metabolic Panel:  Recent Labs Lab 08/31/14 0347 08/31/14 1612 08/31/14 2345 09/01/14 0425 09/01/14 0426 09/01/14 1620 09/02/14 0400  NA 124* 123*  --   --  128* 133* 131*  K 5.4* 4.9 4.8  --  4.9 4.7 4.4  CL 91* 90*  --   --  95* 100* 98*  CO2 18* 19*  --   --  23 26 26   GLUCOSE 215* 246*  --   --  122* 121* 174*  BUN 97* 78*  --   --  47* 23* 19  CREATININE 5.90* 4.93*  --   --  3.36* 1.97* 2.12*  CALCIUM 8.5* 8.0*  --   --  8.1* 8.8* 8.0*  MG  --   --  2.5* 2.3  --   --  2.4  PHOS 7.5* 6.3*  --   --  4.4 2.3* 2.4*   CBC:  Recent Labs Lab 08/30/14 0410  08/30/14 1744 08/31/14 0347 09/01/14 0425 09/02/14 0400  WBC 6.8 7.5 7.1 6.0 7.5  NEUTROABS  --  5.4  --   --   --   HGB 9.8* 9.2* 9.2* 9.2* 9.3*  HCT 29.4* 28.5* 28.2* 28.1* 29.1*  MCV 93.3 94.1 94.3 94.6 98.6  PLT 271 258 274 258 258   BNP (last 3 results)  Recent Labs  05/22/14 1029 05/27/14 1126 06/19/14 1030  BNP 935.2* 1627.8* 738.9*    ProBNP (last 3 results)  Recent Labs  10/09/13 0926 11/14/13 0926 01/14/14 1112  PROBNP 1561.0* 1261.0* 5769.0*   CBG:  Recent Labs Lab 08/31/14 2138 09/01/14 0759 09/01/14 1154 09/01/14 1616 09/01/14 2128  GLUCAP 172* 98 110* 110* 148*    Recent Results (from the past 240 hour(s))  Urine culture     Status: None   Collection Time: 08/25/14  4:12 PM  Result Value Ref Range Status   Specimen Description URINE, CLEAN CATCH  Final   Special Requests NONE  Final   Culture NO GROWTH 1 DAY  Final   Report Status 08/26/2014 FINAL  Final     Studies: US Renal  08/31/2014   CLINICAL DATA:  Current history of hypertension and diabetes, presenting with acute kidney injury and oliguria.  EXAM: RENAL / URINARY TRACT ULTRASOUND COMPLETE  COMPARISON:  Unenhanced CT abdomen and pelvis 05/27/2014.  FINDINGS: Right Kidney:  Length: Approximately 11.1 cm. No hydronephrosis. Well-preserved cortex. No shadowing calculi. Normal parenchymal echotexture. No focal parenchymal abnormality.  Left Kidney:  Length: Approximate 1.5 cm. No hydronephrosis. Well-preserved cortex. No shadowing calculi. Normal parenchymal echotexture. Exophytic 1.8 x 2.2 x 1.7 cm hypoechoic mass with acoustic enhancement and no internal color Doppler flow, shown on the prior CT to represent a hemorrhagic cyst. No significant focal parenchymal abnormality.  Bladder:  Decompressed by Foley catheter.  IMPRESSION: No significant abnormalities. Approximate 2 cm cyst arising from the lower pole of the left kidney, unchanged from the CT in April, 2016.   Electronically Signed   By:  Evangeline Dakin M.D.   On: 08/31/2014 17:04   Dg Chest Port 1 View  08/31/2014   CLINICAL DATA:  Status post central line placement  EXAM: PORTABLE CHEST - 1 VIEW  COMPARISON:  08/29/2014  FINDINGS: Cardiac shadow remains enlarged. A pacing device is again seen a right-sided PICC line is again noted in distal superior vena cava. A new right jugular central line has been placed an lies within the distal superior vena cava. No pneumothorax is noted. No focal infiltrate is seen. No bony abnormality is noted.  IMPRESSION: Status post jugular central line placement in satisfactory position. Remainder of the exam is stable from the previous study   Electronically Signed   By: Inez Catalina M.D.   On: 08/31/2014 09:37    Scheduled Meds: . sodium chloride   Intravenous Once  .  sodium chloride  10 mL/hr Intravenous Once  . ALPRAZolam  0.5 mg Oral QHS  . amiodarone  200 mg Oral BID  . antiseptic oral rinse  7 mL Mouth Rinse BID  . atorvastatin  40 mg Oral q1800  . bisacodyl  5 mg Oral QHS  . bisacodyl  10 mg Rectal Once  . bisacodyl  10 mg Rectal Once  . docusate sodium  100 mg Oral BID  . DULoxetine  30 mg Oral Daily  . insulin aspart  0-9 Units Subcutaneous TID AC & HS  . insulin glargine  15 Units Subcutaneous BID  . lactulose  20 g Oral BID  . levothyroxine  75 mcg Oral QAC breakfast  . milk and molasses  1 enema Rectal Once  . polyethylene glycol  17 g Oral Daily  . sildenafil  80 mg Oral TID   Continuous Infusions: . sodium chloride 10 mL/hr at 08/29/14 0543  . heparin 10,000 units/ 20 mL infusion syringe 1,550 Units/hr (09/02/14 0700)  . heparin 600 Units/hr (09/02/14 0731)  . milrinone 0.25 mcg/kg/min (09/02/14 0100)  . dialysis replacement fluid (prismasate) 700 mL/hr at 09/02/14 0000  . dialysis replacement fluid (prismasate) 700 mL/hr at 09/02/14 0000  . dialysate (PRISMASATE) 1,500 mL/hr at 09/02/14 0430    Principal Problem:   Fracture, intertrochanteric, left femur Active  Problems:   Essential hypertension, benign   Persistent atrial fibrillation   Automatic implantable cardioverter-defibrillator in situ   DM2 (diabetes mellitus, type 2)   OSA (obstructive sleep apnea), intolerant to CPAP/BIPAP   Ventricular tachycardia   Chronic systolic CHF (congestive heart failure)   Hip fracture   Left femoral shaft fracture   Acute on chronic renal failure   Hyponatremia    Time spent: 25 minutes   Kelvin Cellar  [facility] Pager [phone]. If 7PM-7AM, please contact night-coverage at [email or web address], password [facility] 09/02/2014, 7:33 AM  LOS: 8 days

## 2014-09-02 NOTE — Progress Notes (Signed)
Pt requested that laxative and suppository be rescheduled for this afternoon as he wants to sleep.

## 2014-09-02 NOTE — Progress Notes (Signed)
Subjective: Larry [name] is a 64 yo male with PMHx of CKD stage III who was admitted with a left femoral intertrochanteric fracture and subsequently developed acute on chronic renal failure likely secondary to hypoperfusion.   Patient was seen and examined this morning. Patient is confused and anxious. Per nurse, patient has been having panic attacks. He denies any complaints.   Objective: Filed Vitals:   09/02/14 1000 09/02/14 1030 09/02/14 1100 09/02/14 1130  BP: 100/39 125/43 106/40 114/97  Pulse: 77 79 77 80  Temp:      TempSrc:      Resp: 17 17 17 18   Height:      Weight:      SpO2: 92% 96% 95% 95%   General: Vital signs reviewed. Patient is somnolent, in no acute distress and cooperative with exam. Cardiovascular: RRR, S1 normal, S2 normal  Pulmonary/Chest: Mild crackles, no wheezes, or rhonchi.  Abdominal: Soft, non-tender, non-distended, BS +  Extremities: 1-2+ pitting edema to shins bilaterally  Skin: Warm, dry and intact. No rashes or erythema. Psychiatric: Confused, disoriented.   Intake/Output from previous day: 07/24 0701 - 07/25 0700 In: 1115.9 [P.O.:765; I.V.:350.9] Out: 1581 [Urine:68] Intake/Output this shift: Total I/O In: 82.5 [P.O.:30; I.V.:52.5] Out: 278 [Urine:4; Other:274]  Lab Results:  Recent Labs  09/01/14 0425 09/02/14 0400  WBC 6.0 7.5  HGB 9.2* 9.3*  HCT 28.1* 29.1*  PLT 258 258   BMET:   Recent Labs  09/01/14 1620 09/02/14 0400  NA 133* 131*  K 4.7 4.4  CL 100* 98*  CO2 26 26  GLUCOSE 121* 174*  BUN 23* 19  CREATININE 1.97* 2.12*  CALCIUM 8.8* 8.0*   Iron Studies:   Recent Labs  08/30/14 1744  IRON 36*  TIBC 273  TRANSFERRIN 195  FERRITIN 253   CBG (last 3)   Recent Labs  09/01/14 1616 09/01/14 2128 09/02/14 0810  GLUCAP 110* 148* 92   Studies/Results: US Renal  08/31/2014   CLINICAL DATA:  Current history of hypertension and diabetes, presenting with acute kidney injury and oliguria.  EXAM: RENAL / URINARY  TRACT ULTRASOUND COMPLETE  COMPARISON:  Unenhanced CT abdomen and pelvis 05/27/2014.  FINDINGS: Right Kidney:  Length: Approximately 11.1 cm. No hydronephrosis. Well-preserved cortex. No shadowing calculi. Normal parenchymal echotexture. No focal parenchymal abnormality.  Left Kidney:  Length: Approximate 1.5 cm. No hydronephrosis. Well-preserved cortex. No shadowing calculi. Normal parenchymal echotexture. Exophytic 1.8 x 2.2 x 1.7 cm hypoechoic mass with acoustic enhancement and no internal color Doppler flow, shown on the prior CT to represent a hemorrhagic cyst. No significant focal parenchymal abnormality.  Bladder:  Decompressed by Foley catheter.  IMPRESSION: No significant abnormalities. Approximate 2 cm cyst arising from the lower pole of the left kidney, unchanged from the CT in April, 2016.   Electronically Signed   By: Evangeline Dakin M.D.   On: 08/31/2014 17:04    I have reviewed the patient's current medications. Prior to Admission:  Prescriptions prior to admission  Medication Sig Dispense Refill Last Dose  . ALPRAZolam (XANAX) 0.5 MG tablet Take 0.5 mg by mouth See admin instructions. Take 1 tablet (0.5 mg) every night, may take an additional tablet two more times during the day as needed for anxiety   08/24/2014 at Unknown time  . amiodarone (PACERONE) 200 MG tablet Take 1 tablet (200 mg total) by mouth daily. 30 tablet 3 prior to admission 08/21/14  . atorvastatin (LIPITOR) 40 MG tablet TAKE 1 TABLET DAILY 90 tablet 3 prior  to admission 08/21/14  . bisacodyl (DULCOLAX) 5 MG EC tablet Take 5 mg by mouth at bedtime.   prior to admission 08/21/14  . chlorhexidine (PERIDEX) 0.12 % solution Perform mouth rinses twice daily after breakfast and at bedtime. (Patient taking differently: Use as directed in the mouth or throat 2 (two) times daily. Perform mouth rinses twice daily after breakfast and at bedtime (swish and spit)) 120 mL 0 08/25/2014 at am  . digoxin (LANOXIN) 0.125 MG tablet Take 0.5  tablets (0.0625 mg total) by mouth daily. 90 tablet 3 prior to admission 08/21/14  . DULoxetine (CYMBALTA) 30 MG capsule Take 60 mg by mouth 2 (two) times daily.    prior to admission 08/21/14  . GLUCAGON EMERGENCY 1 MG injection Inject 1 mg as directed once as needed (hypotension).    not yet needed  . hydrALAZINE (APRESOLINE) 100 MG tablet Take 1 tablet (100 mg total) by mouth 3 (three) times daily. 90 tablet 1 prior to admission 08/21/14  . insulin glargine (LANTUS) 100 unit/mL SOPN Inject 30 Units into the skin at bedtime.   prior to admission 08/21/14  . insulin lispro (HUMALOG KWIKPEN) 100 UNIT/ML KiwkPen Inject 30 Units into the skin 3 (three) times daily before meals.   prior to admission 08/21/14  . levothyroxine (SYNTHROID, LEVOTHROID) 75 MCG tablet TAKE 1 TABLET ON AN EMPTY STOMACH 30 MINUTES BEFORE BREAKFAST  5 prior to admission 08/21/14  . Melatonin 10 MG TABS Take 10 mg by mouth at bedtime as needed (sleep).    week ago  . metoprolol succinate (TOPROL-XL) 25 MG 24 hr tablet Take 1 tablet (25 mg total) by mouth 2 (two) times daily before a meal. 60 tablet 6 prior to admission 08/21/14 at unknown time  . milrinone (PRIMACOR) 20 MG/100ML SOLN infusion Inject 51.2 mcg/min into the vein continuous. (0.45mcg/kg/min)   08/25/2014 at continuous  . Multiple Vitamin (MULITIVITAMIN WITH MINERALS) TABS Take 1 tablet by mouth daily.   prior to admission 08/21/14  . oxyCODONE-acetaminophen (PERCOCET) 10-325 MG per tablet Take 1 tablet by mouth every 4 (four) hours as needed for pain.   0 08/25/2014 at 600  . Potassium Chloride ER 20 MEQ TBCR Take 20 mEq by mouth 2 (two) times daily. 60 tablet 0 prior to admission 08/21/14  . rivaroxaban (XARELTO) 15 MG TABS tablet Take 1 tablet (15 mg total) by mouth daily with supper. 30 tablet 3 prior to admission 08/21/14  . sildenafil (REVATIO) 20 MG tablet Take 4 tablets (80 mg total) by mouth 3 (three) times daily. 360 tablet 1 prior to admission 08/21/14  . spironolactone  (ALDACTONE) 25 MG tablet TAKE 1 TABLET DAILY 90 tablet 2 prior to admission 08/21/14  . torsemide (DEMADEX) 20 MG tablet Take 3 tablets (60 mg total) by mouth 2 (two) times daily. 180 tablet 1 prior to admission 08/21/14  . zolpidem (AMBIEN) 5 MG tablet Take 1 tablet (5 mg total) by mouth at bedtime as needed for sleep. 30 tablet 0 08/24/2014 at Unknown time  . oxyCODONE-acetaminophen (PERCOCET/ROXICET) 5-325 MG per tablet Take 1-2 tablets by mouth every 4-6 hours as needed for moderate-severe pain. (Patient not taking: Reported on 08/25/2014) 40 tablet 0 Not Taking at Unknown time   Scheduled: . sodium chloride   Intravenous Once  . sodium chloride  10 mL/hr Intravenous Once  . ALPRAZolam  0.5 mg Oral QHS  . amiodarone  200 mg Oral BID  . atorvastatin  40 mg Oral q1800  . bisacodyl  5  mg Oral QHS  . bisacodyl  10 mg Rectal Once  . bisacodyl  10 mg Rectal Once  . chlorhexidine  15 mL Mouth/Throat BID  . docusate sodium  100 mg Oral BID  . DULoxetine  30 mg Oral Daily  . insulin aspart  0-9 Units Subcutaneous TID AC & HS  . insulin glargine  15 Units Subcutaneous BID  . lactulose  20 g Oral BID  . levothyroxine  75 mcg Oral QAC breakfast  . milk and molasses  1 enema Rectal Once  . sildenafil  80 mg Oral TID  . sodium chloride irrigation  200 mL Irrigation Q2H while awake   Continuous: . sodium chloride 10 mL/hr at 08/29/14 0543  . heparin 10,000 units/ 20 mL infusion syringe 1,650 Units/hr (09/02/14 1122)  . heparin 600 Units/hr (09/02/14 0800)  . milrinone 0.25 mcg/kg/min (09/02/14 0800)  . dialysis replacement fluid (prismasate) 700 mL/hr at 09/02/14 0733  . dialysis replacement fluid (prismasate) 700 mL/hr at 09/02/14 0733  . dialysate (PRISMASATE) 1,500 mL/hr at 09/02/14 1125  . sodium chloride irrigation     EBX:IDHWYSHUO, ALPRAZolam **AND** ALPRAZolam, heparin, heparin, morphine injection, ondansetron (ZOFRAN) IV, oxyCODONE-acetaminophen **AND** oxyCODONE, polyethylene glycol,  sodium chloride, sodium chloride, zolpidem  Assessment/Plan:   Acute on Chronic Kidney Disease: Likely secondary to ATN with hypoperfusion, low FENa. Renal US showed no significant abnormalities. Patient remains oliguric with -53 UOP yesterday. Creatinine has trended down from 4.0> 2.12, in the setting of CRRT. Appears volume overloaded on exam, pulling off fluid with CRRT. -Continue CRRT -Repeat renal function panel tomorrow morning -Fluid restrict -Strict I/Os  Hypophosphatemia: Phosphorus 2.4 this morning. -Replace with sodium phos 20 mmol  Hypervolemic Hyponatremia: Likely secondary to acute on chronic kidney failure and CHF. Serum Osm low, urine Osmolality >300, hypervolemic on examination, Urine sodium <20 at 13. Improving. -Repeat RFP tomorrow morning  Normocytic Anemia: Hgb stable. Likely secondary to chronic kidney disease.  Iron low at 36, TIBC normal at 273, Saturation ratio low at 13, ferritin and transferrin normal. -Monitor H/H intermittently   LOS: 8 days   Osa Craver, DO PGY-2 Internal Medicine Resident Pager # [phone] 09/02/2014 12:07 PM

## 2014-09-02 NOTE — Progress Notes (Signed)
ANTICOAGULATION CONSULT NOTE - Follow Up Consult  Pharmacy Consult for Heparin Indication: atrial fibrillation  Allergies  Allergen Reactions  . Bydureon [Exenatide] Other (See Comments)    sweating  . Losartan Potassium Other (See Comments)    insomnia    Patient Measurements: Height: 5\' 10"  (177.8 cm) Weight: 232 lb 2.3 oz (105.3 kg) IBW/kg (Calculated) : 73   Vital Signs: Temp: 97.4 F (36.3 C) (07/25 0400) Temp Source: Oral (07/25 0400) BP: 90/37 mmHg (07/25 0400) Pulse Rate: 76 (07/25 0400)  Labs:  Recent Labs  08/31/14 0347  09/01/14 0425 09/01/14 0426  09/01/14 1525 09/01/14 1620 09/01/14 1740 09/02/14 0400 09/02/14 0435  HGB 9.2*  --  9.2*  --   --   --   --   --  9.3*  --   HCT 28.2*  --  28.1*  --   --   --   --   --  29.1*  --   PLT 274  --  258  --   --   --   --   --  258  --   APTT 108*  < >  --  139*  < > >200*  --  >200*  --  >200*  HEPARINUNFRC >2.20*  --  >2.20*  --   --   --   --   --   --  1.50*  CREATININE 5.90*  < >  --  3.36*  --   --  1.97*  --  2.12*  --   < > = values in this interval not displayed.  Estimated Creatinine Clearance: 43.9 mL/min (by C-G formula based on Cr of 2.12).  Assessment: 64 y.o. male with h/o Afib, Xarelto on hold, for heparin.  Heparin level and aPTT drawn via peripheral stick this morning  Goal of Therapy:  Heparin level 0.3-0.7 units/ml aPTT 66-102 seconds Monitor platelets by anticoagulation protocol: Yes   Plan:  Hold heparin until 0730, then decrease heparin 600 units/hr Check heparin level in 6 hours.   Caryl Pina 09/02/2014,5:43 AM

## 2014-09-02 NOTE — Progress Notes (Signed)
PT Cancellation Note  Patient Details Name: Larry [name] MRN: [id number] DOB: [birth year]   Cancelled Treatment:    Reason Eval/Treat Not Completed: Medical issues which prohibited therapy (pt remains on CRRT and RN states to hold currently as pt sleeping. Encouraged bil LE ROM and activity in bed as able)   Lanetta Inch Beth 09/02/2014, 1:49 PM [name], [place]

## 2014-09-02 NOTE — Progress Notes (Signed)
CRITICAL VALUE ALERT  Critical value received:  APTT >200  Date of notification:  09/02/2014  Time of notification:  6122  Critical value read back:Yes.    Nurse who received alert:  Carleene Cooper  MD notified (1st page): Ileene Patrick, [place]  Time of first page:  1555 MD notified (2nd page):  Time of second page:  Responding MD: Pharmacist Ileene Patrick responded , orders received Time MD responded: 1555

## 2014-09-02 NOTE — Progress Notes (Signed)
Patient ID: Larry [name], male   DOB: 1950-03-18, 64 y.o.   MRN: [id number] Advanced Heart Failure Rounding Note   Subjective:    Larry [name] is a 64 y/o man with history of chronic systolic CHF (nonischemic cardiomyopathy) s/p 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 He is on chronic milrinone 0.5 mcg.  Prior to admit he was being considered for LVAD.   Dischaged 7/16 after dental procedures. Also had RHC with elevate pulmonary pressures. Revatio was increased to 80 mg tid during that stay.  Admitted with fall---> broken L  hip . 7/18 had IM Nail L femur.  7/23 started CVVHD.   Tolerating CVVHD. Alert/oriented this morning.  Milrinone decreased to 0.25 with NSVT, still with PVCs.  Co-ox 73%.  CVP 15.  68 cc UOP yesterday. No dyspnea.    Objective:   Weight Range:  Vital Signs:   Temp:  [97.1 F (36.2 C)-98.1 F (36.7 C)] 97.4 F (36.3 C) (07/25 0400) Pulse Rate:  [75-85] 80 (07/25 0700) Resp:  [14-25] 17 (07/25 0700) BP: (83-122)/(33-53) 103/43 mmHg (07/25 0700) SpO2:  [91 %-100 %] 95 % (07/25 0700) Weight:  [235 lb 14.3 oz (107 kg)] 235 lb 14.3 oz (107 kg) (07/25 0500) Last BM Date: 08/29/14  Weight change: Filed Weights   08/31/14 0346 09/01/14 0300 09/02/14 0500  Weight: 234 lb 1.6 oz (106.187 kg) 232 lb 2.3 oz (105.3 kg) 235 lb 14.3 oz (107 kg)    Intake/Output:   Intake/Output Summary (Last 24 hours) at 09/02/14 0720 Last data filed at 09/02/14 0700  Gross per 24 hour  Intake 1115.88 ml  Output   1581 ml  Net -465.12 ml     Physical Exam: General:  NAD  HEENT: normal Neck: supple. JVP jaw. RIJ trialysis catheter.  Carotids 2+ bilat; no bruits. No lymphadenopathy or thryomegaly appreciated. Cor: PMI nondisplaced. Regular rate & rhythm. No rubs, gallops or murmurs. Lungs: Diminished both bases with mild crackles Abdomen: soft, nontender, nondistended. No hepatosplenomegaly. No  bruits or masses. Good bowel sounds. Extremities: no cyanosis, clubbing, rash. RUE PICC dual lumen . R Thigh lateral aspect dressing intact x2. 2+ edema Neuro: Awake. Lethargic but conversant,  Cranial nerves grossly intact. Affect pleasant. Moves extremities without difficulty. L leg tender.  Telemetry: NSR with PVCs  Labs: Basic Metabolic Panel:  Recent Labs Lab 08/31/14 0347 08/31/14 1612 08/31/14 2345 09/01/14 0425 09/01/14 0426 09/01/14 1620 09/02/14 0400  NA 124* 123*  --   --  128* 133* 131*  K 5.4* 4.9 4.8  --  4.9 4.7 4.4  CL 91* 90*  --   --  95* 100* 98*  CO2 18* 19*  --   --  23 26 26   GLUCOSE 215* 246*  --   --  122* 121* 174*  BUN 97* 78*  --   --  47* 23* 19  CREATININE 5.90* 4.93*  --   --  3.36* 1.97* 2.12*  CALCIUM 8.5* 8.0*  --   --  8.1* 8.8* 8.0*  MG  --   --  2.5* 2.3  --   --  2.4  PHOS 7.5* 6.3*  --   --  4.4 2.3* 2.4*    Liver Function Tests:  Recent Labs Lab 08/31/14 0347 08/31/14 1612 09/01/14 0426 09/01/14 1620 09/02/14 0400  ALBUMIN 3.1* 3.2* 3.1* 3.5 2.9*   No results for input(s): LIPASE, AMYLASE in the last  168 hours. No results for input(s): AMMONIA in the last 168 hours.  CBC:  Recent Labs Lab 08/30/14 0410 08/30/14 1744 08/31/14 0347 09/01/14 0425 09/02/14 0400  WBC 6.8 7.5 7.1 6.0 7.5  NEUTROABS  --  5.4  --   --   --   HGB 9.8* 9.2* 9.2* 9.2* 9.3*  HCT 29.4* 28.5* 28.2* 28.1* 29.1*  MCV 93.3 94.1 94.3 94.6 98.6  PLT 271 258 274 258 258    Cardiac Enzymes: No results for input(s): CKTOTAL, CKMB, CKMBINDEX, TROPONINI in the last 168 hours.  BNP: BNP (last 3 results)  Recent Labs  05/22/14 1029 05/27/14 1126 06/19/14 1030  BNP 935.2* 1627.8* 738.9*    ProBNP (last 3 results)  Recent Labs  10/09/13 0926 11/14/13 0926 01/14/14 1112  PROBNP 1561.0* 1261.0* 5769.0*      Other results:  Imaging: US Renal  08/31/2014   CLINICAL DATA:  Current history of hypertension and diabetes, presenting with  acute kidney injury and oliguria.  EXAM: RENAL / URINARY TRACT ULTRASOUND COMPLETE  COMPARISON:  Unenhanced CT abdomen and pelvis 05/27/2014.  FINDINGS: Right Kidney:  Length: Approximately 11.1 cm. No hydronephrosis. Well-preserved cortex. No shadowing calculi. Normal parenchymal echotexture. No focal parenchymal abnormality.  Left Kidney:  Length: Approximate 1.5 cm. No hydronephrosis. Well-preserved cortex. No shadowing calculi. Normal parenchymal echotexture. Exophytic 1.8 x 2.2 x 1.7 cm hypoechoic mass with acoustic enhancement and no internal color Doppler flow, shown on the prior CT to represent a hemorrhagic cyst. No significant focal parenchymal abnormality.  Bladder:  Decompressed by Foley catheter.  IMPRESSION: No significant abnormalities. Approximate 2 cm cyst arising from the lower pole of the left kidney, unchanged from the CT in April, 2016.   Electronically Signed   By: Evangeline Dakin M.D.   On: 08/31/2014 17:04   Dg Chest Port 1 View  08/31/2014   CLINICAL DATA:  Status post central line placement  EXAM: PORTABLE CHEST - 1 VIEW  COMPARISON:  08/29/2014  FINDINGS: Cardiac shadow remains enlarged. A pacing device is again seen a right-sided PICC line is again noted in distal superior vena cava. A new right jugular central line has been placed an lies within the distal superior vena cava. No pneumothorax is noted. No focal infiltrate is seen. No bony abnormality is noted.  IMPRESSION: Status post jugular central line placement in satisfactory position. Remainder of the exam is stable from the previous study   Electronically Signed   By: Inez Catalina M.D.   On: 08/31/2014 09:37     Medications:     Scheduled Medications: . sodium chloride   Intravenous Once  . sodium chloride  10 mL/hr Intravenous Once  . ALPRAZolam  0.5 mg Oral QHS  . amiodarone  200 mg Oral BID  . antiseptic oral rinse  7 mL Mouth Rinse BID  . atorvastatin  40 mg Oral q1800  . bisacodyl  5 mg Oral QHS  . bisacodyl   10 mg Rectal Once  . docusate sodium  100 mg Oral BID  . DULoxetine  30 mg Oral Daily  . insulin aspart  0-9 Units Subcutaneous TID AC & HS  . insulin glargine  15 Units Subcutaneous BID  . lactulose  20 g Oral Once  . levothyroxine  75 mcg Oral QAC breakfast  . polyethylene glycol  17 g Oral Daily  . sildenafil  80 mg Oral TID    Infusions: . sodium chloride 10 mL/hr at 08/29/14 0543  . heparin 10,000 units/  20 mL infusion syringe 1,550 Units/hr (09/02/14 0700)  . heparin    . milrinone 0.25 mcg/kg/min (09/02/14 0100)  . dialysis replacement fluid (prismasate) 700 mL/hr at 09/02/14 0000  . dialysis replacement fluid (prismasate) 700 mL/hr at 09/02/14 0000  . dialysate (PRISMASATE) 1,500 mL/hr at 09/02/14 0430    PRN Medications: albuterol, ALPRAZolam **AND** ALPRAZolam, heparin, heparin, morphine injection, ondansetron (ZOFRAN) IV, oxyCODONE-acetaminophen **AND** oxyCODONE, polyethylene glycol, polyethylene glycol, sodium chloride, sodium chloride, zolpidem, zolpidem   Assessment:  1. Left Hip Fracture s/p IM nail on 08/26/14 2. Chronic systolic CHF- Nonischemic cardiomyopathy, on home milrinone 0.5 mcg/kg/min 3. Acute on chronic renal failure - suspect post-op ATN  4.  Paroxysmal atrial fibrillation requiring DCCV 02/2014, 04/2014- on chronic amio and xarelto. CHADS VASc Score- 5 5. Diabetes mellitus type 2 6. Depression/anxiety 7. OSA, complex- Needs Bipap at night but had difficulty after dental procedure.  8 Pulmonary HTN - Mixed pulmonary venous hypertension and PAH- on Revatio 80 mg tid 9. Dental extractions 08/2014 by Dr Enrique Sack 10. Obesity Body mass index is 32.39 kg/(m^2). 11. Probable chronic respiratory failure - requiring home O2 with ambulation as of this admission 12. Hyponatremia   13. NSVT  Plan/Discussion:    Now on CVVHD. Suspect post-op ATN, oliguric (68 cc/24 hrs).  CVP remains elevated at 15. Fluid removal at 25 cc/hr. Will try to take this up to 50 cc/hr  today. Appreciate renal input.  Continue milrinone at 0.25 (was on 0.5, decreased with NSVT). Co-ox 73%, K and Mag ok.  Continue higher dose amiodarone at 200 mg bid.   Sinus rhythm with PVCs, no atrial fibrillation. Long PR interval noted on EKG. Continue amiodarone. Off Metoprolol XL with soft BP. Off Xarelto due to renal failure. Now on heparin gtt. Pharmacy following.   Treat constipation.   The patient is critically ill with multiple organ systems failure and requires high complexity decision making for assessment and support, frequent evaluation and titration of therapies, application of advanced monitoring technologies and extensive interpretation of multiple databases.    Length of Stay: [place] Nitika Jackowski,MD 7:20 AM Advanced Heart Failure Team Pager [phone] (M-F; 7a - 4p)  Please contact [facility] for night-coverage after hours (4p -7a ) and weekends on [email or web address]

## 2014-09-03 DIAGNOSIS — I5023 Acute on chronic systolic (congestive) heart failure: Secondary | ICD-10-CM | POA: Insufficient documentation

## 2014-09-03 LAB — RENAL FUNCTION PANEL
ALBUMIN: 2.8 g/dL — AB (ref 3.5–5.0)
ANION GAP: 7 (ref 5–15)
Albumin: 2.7 g/dL — ABNORMAL LOW (ref 3.5–5.0)
Anion gap: 9 (ref 5–15)
BUN: 9 mg/dL (ref 6–20)
BUN: 7 mg/dL (ref 6–20)
CHLORIDE: 100 mmol/L — AB (ref 101–111)
CO2: 26 mmol/L (ref 22–32)
CO2: 26 mmol/L (ref 22–32)
CREATININE: 1.59 mg/dL — AB (ref 0.61–1.24)
Calcium: 8.2 mg/dL — ABNORMAL LOW (ref 8.9–10.3)
Calcium: 8.1 mg/dL — ABNORMAL LOW (ref 8.9–10.3)
Chloride: 98 mmol/L — ABNORMAL LOW (ref 101–111)
Creatinine, Ser: 1.58 mg/dL — ABNORMAL HIGH (ref 0.61–1.24)
GFR calc non Af Amer: 45 mL/min — ABNORMAL LOW (ref >60)
GFR calc non Af Amer: 45 mL/min — ABNORMAL LOW (ref >60)
GFR, EST AFRICAN AMERICAN: 52 mL/min — AB (ref >60)
GFR, EST AFRICAN AMERICAN: 52 mL/min — AB (ref >60)
GLUCOSE: 111 mg/dL — AB (ref 65–99)
Glucose, Bld: 156 mg/dL — ABNORMAL HIGH (ref 65–99)
Phosphorus: 2.8 mg/dL (ref 2.5–4.6)
Phosphorus: 2.4 mg/dL — ABNORMAL LOW (ref 2.5–4.6)
Potassium: 3.5 mmol/L (ref 3.5–5.1)
Potassium: 3.5 mmol/L (ref 3.5–5.1)
SODIUM: 133 mmol/L — AB (ref 135–145)
Sodium: 133 mmol/L — ABNORMAL LOW (ref 135–145)

## 2014-09-03 LAB — POCT ACTIVATED CLOTTING TIME
ACTIVATED CLOTTING TIME: 171 s
ACTIVATED CLOTTING TIME: 183 s
ACTIVATED CLOTTING TIME: 202 s
Activated Clotting Time: 177 seconds
Activated Clotting Time: 190 seconds
Activated Clotting Time: 214 seconds
Activated Clotting Time: 196 seconds

## 2014-09-03 LAB — BASIC METABOLIC PANEL
ANION GAP: 8 (ref 5–15)
BUN: 9 mg/dL (ref 6–20)
CO2: 27 mmol/L (ref 22–32)
Calcium: 8.2 mg/dL — ABNORMAL LOW (ref 8.9–10.3)
Chloride: 100 mmol/L — ABNORMAL LOW (ref 101–111)
Creatinine, Ser: 1.7 mg/dL — ABNORMAL HIGH (ref 0.61–1.24)
GFR calc Af Amer: 48 mL/min — ABNORMAL LOW (ref >60)
GFR, EST NON AFRICAN AMERICAN: 41 mL/min — AB (ref >60)
Glucose, Bld: 116 mg/dL — ABNORMAL HIGH (ref 65–99)
POTASSIUM: 3.6 mmol/L (ref 3.5–5.1)
Sodium: 135 mmol/L (ref 135–145)

## 2014-09-03 LAB — CBC
HCT: 30.9 % — ABNORMAL LOW (ref 39.0–52.0)
Hemoglobin: 9.7 g/dL — ABNORMAL LOW (ref 13.0–17.0)
MCH: 31.3 pg (ref 26.0–34.0)
MCHC: 31.4 g/dL (ref 30.0–36.0)
MCV: 99.7 fL (ref 78.0–100.0)
Platelets: 252 10*3/uL (ref 150–400)
RBC: 3.1 MIL/uL — AB (ref 4.22–5.81)
RDW: 16.2 % — AB (ref 11.5–15.5)
WBC: 7.4 10*3/uL (ref 4.0–10.5)

## 2014-09-03 LAB — GLUCOSE, CAPILLARY
GLUCOSE-CAPILLARY: 89 mg/dL (ref 65–99)
Glucose-Capillary: 90 mg/dL (ref 65–99)
Glucose-Capillary: 86 mg/dL (ref 65–99)
Glucose-Capillary: 85 mg/dL (ref 65–99)

## 2014-09-03 LAB — APTT

## 2014-09-03 LAB — CARBOXYHEMOGLOBIN
Carboxyhemoglobin: 1.9 % — ABNORMAL HIGH (ref 0.5–1.5)
METHEMOGLOBIN: 1.3 % (ref 0.0–1.5)
O2 SAT: 72.6 %
Total hemoglobin: 10 g/dL — ABNORMAL LOW (ref 13.5–18.0)

## 2014-09-03 LAB — CLOSTRIDIUM DIFFICILE BY PCR: Toxigenic C. Difficile by PCR: NEGATIVE

## 2014-09-03 LAB — MAGNESIUM: MAGNESIUM: 2.5 mg/dL — AB (ref 1.7–2.4)

## 2014-09-03 LAB — HEPARIN LEVEL (UNFRACTIONATED)
Heparin Unfractionated: 1.1 IU/mL — ABNORMAL HIGH (ref 0.30–0.70)
Heparin Unfractionated: 1.16 IU/mL — ABNORMAL HIGH (ref 0.30–0.70)

## 2014-09-03 MED ORDER — AMIODARONE HCL 200 MG PO TABS
400.0000 mg | ORAL_TABLET | Freq: Two times a day (BID) | ORAL | Status: DC
  Administered 2014-09-03 – 2014-09-06 (×8): 400 mg via ORAL
  Filled 2014-09-03 (×10): qty 2

## 2014-09-03 NOTE — Progress Notes (Signed)
Subjective: Larry [name] is a 64 yo male with PMHx of CKD stage III who was admitted with a left femoral intertrochanteric fracture and subsequently developed acute on chronic renal failure likely secondary to hypoperfusion.   Patient is somnolent this morning but denies any complaints.   Objective: Filed Vitals:   09/03/14 0830 09/03/14 0900 09/03/14 0930 09/03/14 1000  BP: 108/45 108/45 131/48 116/46  Pulse: 78 75 77 75  Temp:      TempSrc:      Resp: 21 15 14 15   Height:      Weight:      SpO2: 98% 95% 96% 96%   General: Vital signs reviewed. Patient is somnolent, in no acute distress and cooperative with exam. Cardiovascular: RRR, S1 normal, S2 normal, no MRG Pulmonary/Chest: Mild crackles, no wheezes, or rhonchi.  Abdominal: Soft, non-tender, non-distended, BS +  Extremities: Trace pitting edema to shins bilaterally  Skin: Warm, dry and intact. No rashes or erythema.  Intake/Output from previous day: 07/25 0701 - 07/26 0700 In: 1163 [P.O.:610; I.V.:245; IV Piggyback:258] Out: 2617 [Urine:85; Stool:175] Intake/Output this shift: Total I/O In: 143.7 [P.O.:120; I.V.:23.7] Out: 314 [Other:314]  Lab Results:  Recent Labs  09/02/14 0400 09/03/14 0430  WBC 7.5 7.4  HGB 9.3* 9.7*  HCT 29.1* 30.9*  PLT 258 252   BMET:   Recent Labs  09/02/14 1715 09/03/14 0430  NA 134* 135  133*  K 3.9 3.6  3.5  CL 101 100*  98*  CO2 27 27  26   GLUCOSE 123* 116*  156*  BUN 13 9  9   CREATININE 1.81* 1.70*  1.58*  CALCIUM 8.2* 8.2*  8.2*   CBG (last 3)   Recent Labs  09/02/14 1536 09/02/14 2131 09/03/14 0755  GLUCAP 84 106* 90   Studies/Results: No results found.  I have reviewed the patient's current medications. Prior to Admission:  Prescriptions prior to admission  Medication Sig Dispense Refill Last Dose  . ALPRAZolam (XANAX) 0.5 MG tablet Take 0.5 mg by mouth See admin instructions. Take 1 tablet (0.5 mg) every night, may take an additional tablet two  more times during the day as needed for anxiety   08/24/2014 at Unknown time  . amiodarone (PACERONE) 200 MG tablet Take 1 tablet (200 mg total) by mouth daily. 30 tablet 3 prior to admission 08/21/14  . atorvastatin (LIPITOR) 40 MG tablet TAKE 1 TABLET DAILY 90 tablet 3 prior to admission 08/21/14  . bisacodyl (DULCOLAX) 5 MG EC tablet Take 5 mg by mouth at bedtime.   prior to admission 08/21/14  . chlorhexidine (PERIDEX) 0.12 % solution Perform mouth rinses twice daily after breakfast and at bedtime. (Patient taking differently: Use as directed in the mouth or throat 2 (two) times daily. Perform mouth rinses twice daily after breakfast and at bedtime (swish and spit)) 120 mL 0 08/25/2014 at am  . digoxin (LANOXIN) 0.125 MG tablet Take 0.5 tablets (0.0625 mg total) by mouth daily. 90 tablet 3 prior to admission 08/21/14  . DULoxetine (CYMBALTA) 30 MG capsule Take 60 mg by mouth 2 (two) times daily.    prior to admission 08/21/14  . GLUCAGON EMERGENCY 1 MG injection Inject 1 mg as directed once as needed (hypotension).    not yet needed  . hydrALAZINE (APRESOLINE) 100 MG tablet Take 1 tablet (100 mg total) by mouth 3 (three) times daily. 90 tablet 1 prior to admission 08/21/14  . insulin glargine (LANTUS) 100 unit/mL SOPN Inject 30 Units into the skin  at bedtime.   prior to admission 08/21/14  . insulin lispro (HUMALOG KWIKPEN) 100 UNIT/ML KiwkPen Inject 30 Units into the skin 3 (three) times daily before meals.   prior to admission 08/21/14  . levothyroxine (SYNTHROID, LEVOTHROID) 75 MCG tablet TAKE 1 TABLET ON AN EMPTY STOMACH 30 MINUTES BEFORE BREAKFAST  5 prior to admission 08/21/14  . Melatonin 10 MG TABS Take 10 mg by mouth at bedtime as needed (sleep).    week ago  . metoprolol succinate (TOPROL-XL) 25 MG 24 hr tablet Take 1 tablet (25 mg total) by mouth 2 (two) times daily before a meal. 60 tablet 6 prior to admission 08/21/14 at unknown time  . milrinone (PRIMACOR) 20 MG/100ML SOLN infusion Inject 51.2  mcg/min into the vein continuous. (0.1mcg/kg/min)   08/25/2014 at continuous  . Multiple Vitamin (MULITIVITAMIN WITH MINERALS) TABS Take 1 tablet by mouth daily.   prior to admission 08/21/14  . oxyCODONE-acetaminophen (PERCOCET) 10-325 MG per tablet Take 1 tablet by mouth every 4 (four) hours as needed for pain.   0 08/25/2014 at 600  . Potassium Chloride ER 20 MEQ TBCR Take 20 mEq by mouth 2 (two) times daily. 60 tablet 0 prior to admission 08/21/14  . rivaroxaban (XARELTO) 15 MG TABS tablet Take 1 tablet (15 mg total) by mouth daily with supper. 30 tablet 3 prior to admission 08/21/14  . sildenafil (REVATIO) 20 MG tablet Take 4 tablets (80 mg total) by mouth 3 (three) times daily. 360 tablet 1 prior to admission 08/21/14  . spironolactone (ALDACTONE) 25 MG tablet TAKE 1 TABLET DAILY 90 tablet 2 prior to admission 08/21/14  . torsemide (DEMADEX) 20 MG tablet Take 3 tablets (60 mg total) by mouth 2 (two) times daily. 180 tablet 1 prior to admission 08/21/14  . zolpidem (AMBIEN) 5 MG tablet Take 1 tablet (5 mg total) by mouth at bedtime as needed for sleep. 30 tablet 0 08/24/2014 at Unknown time  . oxyCODONE-acetaminophen (PERCOCET/ROXICET) 5-325 MG per tablet Take 1-2 tablets by mouth every 4-6 hours as needed for moderate-severe pain. (Patient not taking: Reported on 08/25/2014) 40 tablet 0 Not Taking at Unknown time   Scheduled: . sodium chloride   Intravenous Once  . sodium chloride  10 mL/hr Intravenous Once  . ALPRAZolam  0.5 mg Oral QHS  . amiodarone  400 mg Oral BID  . atorvastatin  40 mg Oral q1800  . chlorhexidine  15 mL Mouth/Throat BID  . DULoxetine  30 mg Oral Daily  . insulin aspart  0-9 Units Subcutaneous TID AC & HS  . insulin glargine  15 Units Subcutaneous BID  . levothyroxine  75 mcg Oral QAC breakfast  . sildenafil  80 mg Oral TID  . sodium chloride irrigation  200 mL Irrigation Q2H while awake   Continuous: . sodium chloride 10 mL/hr at 08/29/14 0543  . heparin 10,000 units/ 20  mL infusion syringe 3,000 Units/hr (09/03/14 1000)  . milrinone 0.25 mcg/kg/min (09/03/14 0800)  . dialysis replacement fluid (prismasate) 700 mL/hr at 09/03/14 0603  . dialysis replacement fluid (prismasate) 700 mL/hr at 09/03/14 0602  . dialysate (PRISMASATE) 1,500 mL/hr at 09/03/14 0815  . sodium chloride irrigation     MGQ:[id number], ALPRAZolam **AND** ALPRAZolam, heparin, heparin, morphine injection, ondansetron (ZOFRAN) IV, oxyCODONE-acetaminophen **AND** oxyCODONE, sodium chloride, sodium chloride, zolpidem  Assessment/Plan:   Acute on Chronic Kidney Disease: Likely secondary to ATN with hypoperfusion, low FENa. Patient remains oliguric with -50 UOP yesterday. Creatinine has trended down from 4.0> 2.12> 1.70,  in the setting of CRRT. We will continue CRRT today and monitor. -Repeat renal function panel tomorrow morning -Continue CRRT -Fluid restrict -Strict I/Os  Hypophosphatemia: Resolved, phosphorus 2.8 this morning. -Repeat renal function panel tomorrow am  Hypervolemic Hyponatremia: Likely secondary to acute on chronic kidney failure and CHF. Improved, sodium 135 this morning. -Repeat RFP tomorrow morning  Normocytic Anemia: Hgb stable. Likely secondary to chronic kidney disease.  Iron low at 36, TIBC normal at 273, Saturation ratio low at 13, ferritin and transferrin normal. -Monitor H/H intermittently   LOS: 9 days   Osa Craver, DO PGY-2 Internal Medicine Resident Pager # [phone] 09/03/2014 10:34 AM

## 2014-09-03 NOTE — Progress Notes (Signed)
No current change in d/c plan. Will continue to seek SNF placement once medical stability has been obtained.  Will require continued Milrinone at d/c. Has multiple medical issues including progressive renal failure. CSW will continue to monitor and provide support to patient and wife.  [name]. [name], [place]

## 2014-09-03 NOTE — Progress Notes (Signed)
Patient refused Bi-Pap for tonight. Patient is stable and tolerating [place] 3L sat 97. RT will continue to monitor as needed.

## 2014-09-03 NOTE — Progress Notes (Addendum)
Patient ID: Larry [name], male   DOB: [birth year], 64 y.o.   MRN: [id number] Advanced Heart Failure Rounding Note   Subjective:    Larry [name] is a 64 y/o man with history of chronic systolic CHF (nonischemic cardiomyopathy) s/p 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 He is on chronic milrinone 0.5 mcg.  Prior to admit he was being considered for LVAD.   Dischaged 7/16 after dental procedures. Also had RHC with elevate pulmonary pressures. Revatio was increased to 80 mg tid during that stay.  Admitted with fall---> broken L  hip . 7/18 had IM Nail L femur.  7/23 started CVVHD.   Tolerating CVVHD. Awake but drowsy with some confusion this morning.  Milrinone decreased to 0.25 with NSVT, still with PVCs.  Co-ox 73% again this morning.  CVP 14.  85 cc UOP yesterday. No dyspnea.  UF at 50 cc/hr.   Got a lot of laxatives yesterday, developed diarrhea.   Objective:   Weight Range:  Vital Signs:   Temp:  [97.4 F (36.3 C)-98 F (36.7 C)] 97.4 F (36.3 C) (07/26 0400) Pulse Rate:  [73-86] 77 (07/26 0730) Resp:  [13-29] 18 (07/26 0730) BP: (85-125)/(34-97) 103/50 mmHg (07/26 0730) SpO2:  [92 %-99 %] 99 % (07/26 0730) Weight:  [230 lb 2.6 oz (104.4 kg)] 230 lb 2.6 oz (104.4 kg) (07/26 0500) Last BM Date: 09/02/14  Weight change: Filed Weights   09/01/14 0300 09/02/14 0500 09/03/14 0500  Weight: 232 lb 2.3 oz (105.3 kg) 235 lb 14.3 oz (107 kg) 230 lb 2.6 oz (104.4 kg)    Intake/Output:   Intake/Output Summary (Last 24 hours) at 09/03/14 0835 Last data filed at 09/03/14 0700  Gross per 24 hour  Intake 1122.2 ml  Output   2547 ml  Net -1424.8 ml     Physical Exam: General:  NAD  HEENT: normal Neck: supple. JVP 10 cm. RIJ trialysis catheter.  Carotids 2+ bilat; no bruits. No lymphadenopathy or thryomegaly appreciated. Cor: PMI nondisplaced. Regular rate & rhythm. No rubs, gallops or  murmurs. Lungs: Diminished both bases with mild crackles Abdomen: soft, nontender, nondistended. No hepatosplenomegaly. No bruits or masses. Good bowel sounds. Extremities: no cyanosis, clubbing, rash. RUE PICC dual lumen . R Thigh lateral aspect dressing intact x2. 1+ ankle edema. Neuro: Awake. Lethargic but conversant,  Cranial nerves grossly intact. Affect pleasant. Moves extremities without difficulty. L leg tender.  Telemetry: NSR with PVCs  Labs: Basic Metabolic Panel:  Recent Labs Lab 08/31/14 2345 09/01/14 0425 09/01/14 0426 09/01/14 1620 09/02/14 0400 09/02/14 1715 09/03/14 0430  NA  --   --  128* 133* 131* 134* 135  133*  K 4.8  --  4.9 4.7 4.4 3.9 3.6  3.5  CL  --   --  95* 100* 98* 101 100*  98*  CO2  --   --  23 26 26 27 27  26   GLUCOSE  --   --  122* 121* 174* 123* 116*  156*  BUN  --   --  47* 23* 19 13 9  9   CREATININE  --   --  3.36* 1.97* 2.12* 1.81* 1.70*  1.58*  CALCIUM  --   --  8.1* 8.8* 8.0* 8.2* 8.2*  8.2*  MG 2.5* 2.3  --   --  2.4  --  2.5*  PHOS  --   --  4.4 2.3* 2.4* 3.4 2.8  Liver Function Tests:  Recent Labs Lab 09/01/14 0426 09/01/14 1620 09/02/14 0400 09/02/14 1715 09/03/14 0430  ALBUMIN 3.1* 3.5 2.9* 2.7* 2.8*   No results for input(s): LIPASE, AMYLASE in the last 168 hours. No results for input(s): AMMONIA in the last 168 hours.  CBC:  Recent Labs Lab 08/30/14 1744 08/31/14 0347 09/01/14 0425 09/02/14 0400 09/03/14 0430  WBC 7.5 7.1 6.0 7.5 7.4  NEUTROABS 5.4  --   --   --   --   HGB 9.2* 9.2* 9.2* 9.3* 9.7*  HCT 28.5* 28.2* 28.1* 29.1* 30.9*  MCV 94.1 94.3 94.6 98.6 99.7  PLT 258 274 258 258 252    Cardiac Enzymes: No results for input(s): CKTOTAL, CKMB, CKMBINDEX, TROPONINI in the last 168 hours.  BNP: BNP (last 3 results)  Recent Labs  05/22/14 1029 05/27/14 1126 06/19/14 1030  BNP 935.2* 1627.8* 738.9*    ProBNP (last 3 results)  Recent Labs  10/09/13 0926 11/14/13 0926 01/14/14 1112   PROBNP 1561.0* 1261.0* 5769.0*      Other results:  Imaging: No results found.   Medications:     Scheduled Medications: . sodium chloride   Intravenous Once  . sodium chloride  10 mL/hr Intravenous Once  . ALPRAZolam  0.5 mg Oral QHS  . amiodarone  400 mg Oral BID  . atorvastatin  40 mg Oral q1800  . chlorhexidine  15 mL Mouth/Throat BID  . DULoxetine  30 mg Oral Daily  . insulin aspart  0-9 Units Subcutaneous TID AC & HS  . insulin glargine  15 Units Subcutaneous BID  . lactulose  20 g Oral BID  . levothyroxine  75 mcg Oral QAC breakfast  . sildenafil  80 mg Oral TID  . sodium chloride irrigation  200 mL Irrigation Q2H while awake    Infusions: . sodium chloride 10 mL/hr at 08/29/14 0543  . heparin 10,000 units/ 20 mL infusion syringe 3,000 Units/hr (09/03/14 0823)  . milrinone 0.25 mcg/kg/min (09/03/14 0325)  . dialysis replacement fluid (prismasate) 700 mL/hr at 09/03/14 0603  . dialysis replacement fluid (prismasate) 700 mL/hr at 09/03/14 0602  . dialysate (PRISMASATE) 1,500 mL/hr at 09/03/14 0815  . sodium chloride irrigation      PRN Medications: albuterol, ALPRAZolam **AND** ALPRAZolam, heparin, heparin, morphine injection, ondansetron (ZOFRAN) IV, oxyCODONE-acetaminophen **AND** oxyCODONE, sodium chloride, sodium chloride, zolpidem   Assessment:  1. Left Hip Fracture s/p IM nail on 08/26/14 2. Chronic systolic CHF- Nonischemic cardiomyopathy, on home milrinone 0.5 mcg/kg/min, decreased to 0.25 here.  3. Acute on chronic renal failure - suspect post-op ATN  4.  Paroxysmal atrial fibrillation requiring DCCV 02/2014, 04/2014- on chronic amio and xarelto. CHADS VASc Score- 5.  Remains in NSR.  5. Diabetes mellitus type 2 6. Depression/anxiety 7. OSA, complex- Needs Bipap at night but had difficulty after dental procedure.  8 Pulmonary HTN - Mixed pulmonary venous hypertension and PAH- on Revatio 80 mg tid 9. Dental extractions 08/2014 by Dr Enrique Sack 10.  Obesity Body mass index is 32.39 kg/(m^2). 11. Probable chronic respiratory failure - requiring home O2 with ambulation as of this admission 12. Hyponatremia   13. NSVT 14. Delirium  Plan/Discussion:    Now on CVVHD. Suspect post-op ATN, oliguric (68 cc/24 hrs).  CVP remains elevated at 14. Fluid removal at 50 cc/hr, will increase to 75 cc/hr today.  Awaiting renal recovery.  Appreciate nephrology input.  Continue milrinone at 0.25 (was on 0.5, decreased with NSVT and frequent ectopy). Milrinone ok for now  while on CVVH, but probably not ideal if he requires HD (?switch to dobutamine at that point).  Co-ox remains 73%, K and Mag ok.  Reload amiodarone with ventricular ectopy, will give at 400 mg bid for now.   Sinus rhythm with PVCs, no atrial fibrillation. Long PR interval noted on EKG. Continue amiodarone. Off Metoprolol XL with soft BP. Off Xarelto due to renal failure. Now on heparin gtt. Pharmacy following.   The patient is critically ill with multiple organ systems failure and requires high complexity decision making for assessment and support, frequent evaluation and titration of therapies, application of advanced monitoring technologies and extensive interpretation of multiple databases.    Length of Stay: 9  Tavares Levinson,MD 8:35 AM Advanced Heart Failure Team Pager [phone] (M-F; 7a - 4p)  Please contact [facility] for night-coverage after hours (4p -7a ) and weekends on [email or web address]

## 2014-09-03 NOTE — Progress Notes (Signed)
CRITICAL VALUE ALERT  Critical value received:  PTT >200  Date of notification:  09/03/2014 Time of notification:  0530  Critical value read back:Yes.    Nurse who received alert:  Fernand Parkins RN  Unchanged lab value from previous values.

## 2014-09-03 NOTE — Progress Notes (Signed)
CRITICAL VALUE ALERT  Critical value received:  APTT >200 Date of notification:  09/03/2014  Time of notification:  2751  Critical value read back:Yes.    Nurse who received alert:  Loni Muse  MD notified (1st page):  Pharmacy managing anticoagulation, Ileene Patrick PharmD notified  Time of first page:  73  MD notified (2nd page):  Time of second page:  Responding MD:  Ileene Patrick, Sherian Rein D  Time MD responded:  [phone]

## 2014-09-03 NOTE — Progress Notes (Signed)
[facility]  Patient Status:   Active pt with [facility] prior to this readmission  [facility] is providing the following services: [facility]HRN and [facility] for home milrinone.  [facility] team will continue to follow pt while inpatient and support milrinone for home or SNF upon DC.   If patient discharges after hours, please call [phone].   Larry Sierras 09/03/2014, 9:32 PM

## 2014-09-03 NOTE — Progress Notes (Signed)
OT Cancellation Note  Patient Details Name: Larry [name] MRN: [id number] DOB: [birth year]   Cancelled Treatment:    Reason Eval/Treat Not Completed: Medical issues which prohibited therapy (Pt on CRRT. Will continue to follow.)  [name] 09/03/2014, 11:19 AM  [phone]

## 2014-09-03 NOTE — Progress Notes (Signed)
Patient refused BI-PAP for tonight. RT explain the importance of wearing it. Patient still refused.

## 2014-09-03 NOTE — Progress Notes (Signed)
Spoke with renal oncall MD Dr. Posey Pronto. Informed Dr Posey Pronto about pts ACT's and dosage per CRRT machine. Pt is at max rate for heparin on machine and ACT's are not within desired range. MD asked if the filter was clotting, no issues with filter thus far. MD advised to keep current rate and do not titrate. Will continue to monitor.

## 2014-09-03 NOTE — Progress Notes (Addendum)
ANTICOAGULATION CONSULT NOTE - Follow Up Consult  Pharmacy Consult for Heparin (while Xarelto on hold) Indication: atrial fibrillation  Allergies  Allergen Reactions  . Bydureon [Exenatide] Other (See Comments)    sweating  . Losartan Potassium Other (See Comments)    insomnia    Patient Measurements: Height: 5\' 10"  (177.8 cm) Weight: 230 lb 2.6 oz (104.4 kg) IBW/kg (Calculated) : 73 Heparin Dosing Weight: 96 kg  Vital Signs: Temp: 96.8 F (36 C) (07/26 0800) Temp Source: Axillary (07/26 0800) BP: 108/45 mmHg (07/26 0900) Pulse Rate: 75 (07/26 0900)  Labs:  Recent Labs  09/01/14 0425  09/02/14 0400 09/02/14 0435 09/02/14 1415 09/02/14 1715 09/03/14 0430  HGB 9.2*  --  9.3*  --   --   --  9.7*  HCT 28.1*  --  29.1*  --   --   --  30.9*  PLT 258  --  258  --   --   --  252  APTT  --   < >  --  >200* >200*  --  >200*  HEPARINUNFRC >2.20*  --   --  1.50*  --   --  1.10*  CREATININE  --   < > 2.12*  --   --  1.81* 1.70*  1.58*  < > = values in this interval not displayed.  Estimated Creatinine Clearance: 58.7 mL/min (by C-G formula based on Cr of 1.58).   Medications:  Heparin on hold  Assessment: 64 YOM on Xarelto PTA for hx Afib - held in the setting of AKI requiring CRRT and transitioned to heparin. The patient is noted to have both a PICC line and a RIJ HD catheter. Heparin was infusing in the PICC line and the aPTT labs are being drawn from the RIJ - both of which are being pulled from the central blood flow. Not able to draw from peripheral site 2/2 edema.  Since aPTT/HL were still elevated yesterday, heparin was held. This am aPTT/HL remain elevated despite only receiving heparin via CRRT (site of lab draw unknown). After redraw from PICC line after flushing sufficiently, aPTT and HL remain elevated (possibly from effects of CRRT heparin). Will again reassess aPTT/HL with am labs.   Goal of Therapy:  Heparin level 0.3-0.7 units/ml aPTT 66-102  seconds Monitor platelets by anticoagulation protocol: Yes   Plan:  - Continue to hold heparin gtt - F/u am aPTT/HL  - Monitor for bleeding  Didi Ganaway K. Velva Harman, PharmD, [place] Clinical Pharmacist Pager: [phone] Phone: [phone] 09/03/2014 9:45 AM

## 2014-09-03 NOTE — Progress Notes (Signed)
[facility] PROGRESS NOTE  Larry [name]:[id number] DOB: [birth year] DOA: 08/25/2014 PCP: [name], MD   Interim summary Patient is a pleasant 64 year old gentleman with a past medical history of nonischemic cardiomyopathy having an ejection fraction of 25% who is on milrinone, history of ventricular tachycardia status post dual-chamber pacemaker implant, paroxysmal atrial fibrillation, diabetes, stage IV chronic kidney disease, admitted to the medicine service on 08/25/2014 after having a fall at home. He denied symptoms prior to fall. Imaging studies revealed a left femoral intertrochanteric fracture. Cardiology and orthopedic surgery were consulted. He was seen by Dr. Haroldine Laws and Dr. Aundra Dubin of the heart failure team. On 08/26/2014 he was taken to the operating room are again underwent intramedullary nail placement of left femur fracture. His postoperative course was complicated by acute on chronic renal failure with his creatinine peaking at 5.9 on 08/31/2014. At this time he was found to have an increase in potassium with a decrease in bicarbonate. Nephrology was consulted. Recommended initiating CRRT. A central venous catheter was placed on 08/31/2014 where and was started on CRRT.   Assessment/Plan:  1. Severe nonischemic cardiomyopathy with chronic systolic heart failure with an EF of 25%. History of V. tach. Patient has AICD, on home milrinone drip -Patient having a 4 kg weight gain since 08/26/2014, his weight increasing to 106.1 kg on 08/31/2014 from 102kg on admission -Patient was started on CVVHD on 08/31/2014,  transfer to intensive care unit. -Cardiology managing milrinone -He is tolerating CRT.  -Patient having urinary output of 85 mL  2.  Acute on chronic renal failure -Labs showing ongoing upward trend in his creatinine, rising to 5.9 from 4.07, a.m. labs also showing a bicarbonate of 18 with potassium of 5.4. He was started on IV Lasix yesterday by nephrology  at 160 mg IV every 6 hours. Despite this intervention is had little urinary output. -Xarelto was discontinued and started on IV heparin -Nephrology was consulted, feeling renal failure likely represents postoperative ATN/hypoperfusion from hypotension -CRRT was started on 08/31/2014 -A.m. labs on 09/03/2014 showing creatinine of 1.7 -Has urine output of 85 mL overnight -Nephrology managing   3. Constipation -Patient having several large bowel movements overnight  4.  Hyponatremia -I suspect secondary to volume overload -Improved after starting CRT, sodium at 135  5. Mechanical trip and fall with left femoral intertrochanteric fracture.  -status post intramedullary nail to left hip on 7/19 -WBAT -PT/OT evaluation -Plan for discharge to SNF when medically stable  6. Paroxysmal atrial fibrillation.  -Xarelto discontinued due to worsening kidney function, now on IV heparin -On Amio  7. Obstructive sleep apnea.  -Patient refusing BiPAP  8. DM type II. recent A1c of 7.7 -continue Lantus and SSI -Blood sugars remaining in the 100 range, on Lantus 15 units subcutaneous twice a day   Code Status: Full Family Communication: wife at bedside Disposition Plan: Patient was transferred to [facility] for closer monitoring   Consultants:  Ortho (Dr. Percell Miller)  HF team   Nephrology  Procedures:  S/P intramedullary nail on left hip on 7/18  Antibiotics:  None   HPI/Subjective: Today reports having multiple bowel movements overnight, didn't sleep well because of that. He was given Miralax, Colace, suppository and Lactulose yesterday for constipation.   Denies shortness of breath, reports improvement to lower extremity edema. Still not eating well   Objective: Filed Vitals:   09/03/14 0730  BP: 103/50  Pulse: 77  Temp:   Resp: 18    Intake/Output Summary (Last 24 hours) at  09/03/14 0810 Last data filed at 09/03/14 0700  Gross per 24 hour  Intake 1122.2 ml  Output   2547 ml   Net -1424.8 ml   Filed Weights   09/01/14 0300 09/02/14 0500 09/03/14 0500  Weight: 105.3 kg (232 lb 2.3 oz) 107 kg (235 lb 14.3 oz) 104.4 kg (230 lb 2.6 oz)    Exam:   General:  He is awake and alert, oriented, follows commands, chronically ill-appearing   Cardiovascular:norubs, no gallops, RRR  Respiratory: Normal respiratory effort, lungs are clear to auscultation bilaterally  Abdomen: soft, NT, ND, positive BS Musculoskeletal: Surgical incision site appears clean, no evidence of infection.  There is significant improvement to lower extremity pitting edema Data Reviewed: Basic Metabolic Panel:  Recent Labs Lab 08/31/14 2345 09/01/14 0425 09/01/14 0426 09/01/14 1620 09/02/14 0400 09/02/14 1715 09/03/14 0430  NA  --   --  128* 133* 131* 134* 135  133*  K 4.8  --  4.9 4.7 4.4 3.9 3.6  3.5  CL  --   --  95* 100* 98* 101 100*  98*  CO2  --   --  23 26 26 27 27  26   GLUCOSE  --   --  122* 121* 174* 123* 116*  156*  BUN  --   --  47* 23* 19 13 9  9   CREATININE  --   --  3.36* 1.97* 2.12* 1.81* 1.70*  1.58*  CALCIUM  --   --  8.1* 8.8* 8.0* 8.2* 8.2*  8.2*  MG 2.5* 2.3  --   --  2.4  --  2.5*  PHOS  --   --  4.4 2.3* 2.4* 3.4 2.8   CBC:  Recent Labs Lab 08/30/14 1744 08/31/14 0347 09/01/14 0425 09/02/14 0400 09/03/14 0430  WBC 7.5 7.1 6.0 7.5 7.4  NEUTROABS 5.4  --   --   --   --   HGB 9.2* 9.2* 9.2* 9.3* 9.7*  HCT 28.5* 28.2* 28.1* 29.1* 30.9*  MCV 94.1 94.3 94.6 98.6 99.7  PLT 258 274 258 258 252   BNP (last 3 results)  Recent Labs  05/22/14 1029 05/27/14 1126 06/19/14 1030  BNP 935.2* 1627.8* 738.9*    ProBNP (last 3 results)  Recent Labs  10/09/13 0926 11/14/13 0926 01/14/14 1112  PROBNP 1561.0* 1261.0* 5769.0*   CBG:  Recent Labs Lab 09/02/14 0810 09/02/14 1117 09/02/14 1536 09/02/14 2131 09/03/14 0755  GLUCAP 92 97 84 106* 90    Recent Results (from the past 240 hour(s))  Urine culture     Status: None   Collection  Time: 08/25/14  4:12 PM  Result Value Ref Range Status   Specimen Description URINE, CLEAN CATCH  Final   Special Requests NONE  Final   Culture NO GROWTH 1 DAY  Final   Report Status 08/26/2014 FINAL  Final     Studies: No results found.  Scheduled Meds: . sodium chloride   Intravenous Once  . sodium chloride  10 mL/hr Intravenous Once  . ALPRAZolam  0.5 mg Oral QHS  . amiodarone  200 mg Oral BID  . atorvastatin  40 mg Oral q1800  . chlorhexidine  15 mL Mouth/Throat BID  . DULoxetine  30 mg Oral Daily  . insulin aspart  0-9 Units Subcutaneous TID AC & HS  . insulin glargine  15 Units Subcutaneous BID  . lactulose  20 g Oral BID  . levothyroxine  75 mcg Oral QAC breakfast  . sildenafil  80 mg Oral TID  . sodium chloride irrigation  200 mL Irrigation Q2H while awake   Continuous Infusions: . sodium chloride 10 mL/hr at 08/29/14 0543  . heparin 10,000 units/ 20 mL infusion syringe 2,500 Units/hr (09/03/14 0700)  . milrinone 0.25 mcg/kg/min (09/03/14 0325)  . dialysis replacement fluid (prismasate) 700 mL/hr at 09/03/14 0603  . dialysis replacement fluid (prismasate) 700 mL/hr at 09/03/14 0602  . dialysate (PRISMASATE) 1,500 mL/hr at 09/03/14 0442  . sodium chloride irrigation      Principal Problem:   Fracture, intertrochanteric, left femur Active Problems:   Essential hypertension, benign   Persistent atrial fibrillation   Automatic implantable cardioverter-defibrillator in situ   DM2 (diabetes mellitus, type 2)   OSA (obstructive sleep apnea), intolerant to CPAP/BIPAP   Ventricular tachycardia   Chronic systolic CHF (congestive heart failure)   Hip fracture   Left femoral shaft fracture   Acute on chronic renal failure   Hyponatremia    Time spent: 25 minutes   Kelvin Cellar  [facility] Pager [phone]. If 7PM-7AM, please contact night-coverage at [email or web address], password [facility] 09/03/2014, 8:10 AM  LOS: 9 days

## 2014-09-04 ENCOUNTER — Inpatient Hospital Stay (HOSPITAL_COMMUNITY): Payer: Commercial Managed Care - HMO

## 2014-09-04 ENCOUNTER — Encounter (HOSPITAL_COMMUNITY): Payer: Self-pay

## 2014-09-04 DIAGNOSIS — N184 Chronic kidney disease, stage 4 (severe): Secondary | ICD-10-CM

## 2014-09-04 DIAGNOSIS — N179 Acute kidney failure, unspecified: Secondary | ICD-10-CM | POA: Insufficient documentation

## 2014-09-04 LAB — COMPREHENSIVE METABOLIC PANEL
ALBUMIN: 2.7 g/dL — AB (ref 3.5–5.0)
ALK PHOS: 68 U/L (ref 38–126)
ALT: 10 U/L — AB (ref 17–63)
AST: 26 U/L (ref 15–41)
Anion gap: 5 (ref 5–15)
BILIRUBIN TOTAL: 1.1 mg/dL (ref 0.3–1.2)
BUN: 6 mg/dL (ref 6–20)
CO2: 27 mmol/L (ref 22–32)
Calcium: 8.1 mg/dL — ABNORMAL LOW (ref 8.9–10.3)
Chloride: 99 mmol/L — ABNORMAL LOW (ref 101–111)
Creatinine, Ser: 1.47 mg/dL — ABNORMAL HIGH (ref 0.61–1.24)
GFR calc Af Amer: 57 mL/min — ABNORMAL LOW (ref >60)
GFR calc non Af Amer: 49 mL/min — ABNORMAL LOW (ref >60)
GLUCOSE: 119 mg/dL — AB (ref 65–99)
Potassium: 3.3 mmol/L — ABNORMAL LOW (ref 3.5–5.1)
Sodium: 131 mmol/L — ABNORMAL LOW (ref 135–145)
TOTAL PROTEIN: 6.7 g/dL (ref 6.5–8.1)

## 2014-09-04 LAB — GLUCOSE, CAPILLARY
GLUCOSE-CAPILLARY: 185 mg/dL — AB (ref 65–99)
Glucose-Capillary: 386 mg/dL — ABNORMAL HIGH (ref 65–99)
Glucose-Capillary: 39 mg/dL — CL (ref 65–99)
Glucose-Capillary: 48 mg/dL — ABNORMAL LOW (ref 65–99)
Glucose-Capillary: 79 mg/dL (ref 65–99)
Glucose-Capillary: 125 mg/dL — ABNORMAL HIGH (ref 65–99)
Glucose-Capillary: 279 mg/dL — ABNORMAL HIGH (ref 65–99)

## 2014-09-04 LAB — POCT ACTIVATED CLOTTING TIME
ACTIVATED CLOTTING TIME: 214 s
ACTIVATED CLOTTING TIME: 239 s
ACTIVATED CLOTTING TIME: 245 s
ACTIVATED CLOTTING TIME: 257 s
ACTIVATED CLOTTING TIME: 233 s
ACTIVATED CLOTTING TIME: 227 s
ACTIVATED CLOTTING TIME: 220 s
Activated Clotting Time: 177 seconds
Activated Clotting Time: 183 seconds
Activated Clotting Time: 190 seconds
Activated Clotting Time: 190 seconds
Activated Clotting Time: 208 seconds
Activated Clotting Time: 214 seconds
Activated Clotting Time: 214 seconds
Activated Clotting Time: 220 seconds
Activated Clotting Time: 220 seconds
Activated Clotting Time: 227 seconds
Activated Clotting Time: 227 seconds
Activated Clotting Time: 232 seconds
Activated Clotting Time: 232 seconds
Activated Clotting Time: 233 seconds
Activated Clotting Time: 245 seconds

## 2014-09-04 LAB — MAGNESIUM: MAGNESIUM: 2.3 mg/dL (ref 1.7–2.4)

## 2014-09-04 LAB — CBC
HEMATOCRIT: 31.7 % — AB (ref 39.0–52.0)
HEMOGLOBIN: 10 g/dL — AB (ref 13.0–17.0)
MCH: 31.2 pg (ref 26.0–34.0)
MCHC: 31.5 g/dL (ref 30.0–36.0)
MCV: 98.8 fL (ref 78.0–100.0)
PLATELETS: 281 10*3/uL (ref 150–400)
RBC: 3.21 MIL/uL — AB (ref 4.22–5.81)
RDW: 15.8 % — AB (ref 11.5–15.5)
WBC: 8.9 10*3/uL (ref 4.0–10.5)

## 2014-09-04 LAB — CARBOXYHEMOGLOBIN
CARBOXYHEMOGLOBIN: 2 % — AB (ref 0.5–1.5)
Carboxyhemoglobin: 1.9 % — ABNORMAL HIGH (ref 0.5–1.5)
METHEMOGLOBIN: 1.1 % (ref 0.0–1.5)
Methemoglobin: 1.3 % (ref 0.0–1.5)
O2 Saturation: 69.4 %
O2 Saturation: 77.6 %
TOTAL HEMOGLOBIN: 10.3 g/dL — AB (ref 13.5–18.0)
TOTAL HEMOGLOBIN: 10.7 g/dL — AB (ref 13.5–18.0)

## 2014-09-04 LAB — RENAL FUNCTION PANEL
Albumin: 3 g/dL — ABNORMAL LOW (ref 3.5–5.0)
Anion gap: 10 (ref 5–15)
BUN: <5 mg/dL — ABNORMAL LOW (ref 6–20)
CO2: 26 mmol/L (ref 22–32)
Calcium: 7.7 mg/dL — ABNORMAL LOW (ref 8.9–10.3)
Chloride: 97 mmol/L — ABNORMAL LOW (ref 101–111)
Creatinine, Ser: 1.24 mg/dL (ref 0.61–1.24)
GFR calc Af Amer: >60 mL/min (ref >60)
GFR calc non Af Amer: >60 mL/min (ref >60)
GLUCOSE: 152 mg/dL — AB (ref 65–99)
Phosphorus: 2.2 mg/dL — ABNORMAL LOW (ref 2.5–4.6)
Potassium: 3.9 mmol/L (ref 3.5–5.1)
SODIUM: 133 mmol/L — AB (ref 135–145)

## 2014-09-04 LAB — PARATHYROID HORMONE, INTACT (NO CA): PTH: 99 pg/mL — ABNORMAL HIGH (ref 15–65)

## 2014-09-04 LAB — HEPARIN LEVEL (UNFRACTIONATED): Heparin Unfractionated: 1.5 IU/mL — ABNORMAL HIGH (ref 0.30–0.70)

## 2014-09-04 LAB — APTT

## 2014-09-04 MED ORDER — SODIUM PHOSPHATE 3 MMOLE/ML IV SOLN
20.0000 mmol | Freq: Once | INTRAVENOUS | Status: AC
  Administered 2014-09-04: 20 mmol via INTRAVENOUS
  Filled 2014-09-04: qty 6.67

## 2014-09-04 MED ORDER — SODIUM CHLORIDE 0.9 % IV SOLN
INTRAVENOUS | Status: DC | PRN
  Administered 2014-09-08: 6 mL/h via INTRA_ARTERIAL

## 2014-09-04 MED ORDER — DEXTROSE 50 % IV SOLN
INTRAVENOUS | Status: AC
  Administered 2014-09-04: 50 mL
  Filled 2014-09-04: qty 50

## 2014-09-04 MED ORDER — DEXTROSE 5 % IV SOLN
0.0000 ug/min | INTRAVENOUS | Status: DC
  Filled 2014-09-04: qty 4

## 2014-09-04 MED ORDER — DOBUTAMINE IN D5W 4-5 MG/ML-% IV SOLN
2.5000 ug/kg/min | INTRAVENOUS | Status: DC
  Administered 2014-09-04 – 2014-09-12 (×4): 2.5 ug/kg/min via INTRAVENOUS
  Administered 2014-09-15: 2 ug/kg/min via INTRAVENOUS
  Administered 2014-09-17: 2.5 ug/kg/min via INTRAVENOUS
  Filled 2014-09-04 (×6): qty 250

## 2014-09-04 MED ORDER — NOREPINEPHRINE BITARTRATE 1 MG/ML IV SOLN
0.0000 ug/min | INTRAVENOUS | Status: DC
  Administered 2014-09-04: 10 ug/min via INTRAVENOUS
  Filled 2014-09-04: qty 4

## 2014-09-04 MED ORDER — POTASSIUM CHLORIDE CRYS ER 20 MEQ PO TBCR
40.0000 meq | EXTENDED_RELEASE_TABLET | Freq: Once | ORAL | Status: DC
  Filled 2014-09-04: qty 2

## 2014-09-04 MED ORDER — DEXTROSE 5 % IV SOLN
0.0000 ug/min | INTRAVENOUS | Status: DC
  Administered 2014-09-04: 16 ug/min via INTRAVENOUS
  Filled 2014-09-04: qty 16

## 2014-09-04 NOTE — Progress Notes (Signed)
Hypoglycemic Event  CBG: 39  Treatment: D50 IV 25 mL  Symptoms: Pale and Sweaty  Follow-up CBG: Time:1254 CBG Result:79  Possible Reasons for Event: Inadequate meal intake  Comments/MD notified:amy clegg,np    Larry [name]  Remember to initiate Hypoglycemia Order Set & complete

## 2014-09-04 NOTE — Progress Notes (Signed)
OT Cancellation Note  Patient Details Name: Larry [name] MRN: [id number] DOB: 1950-12-02   Cancelled Treatment:    Reason Eval/Treat Not Completed: Medical issues which prohibited therapy  [name] 09/04/2014, 2:15 PM

## 2014-09-04 NOTE — Progress Notes (Signed)
   09/04/14 1551  OT Visit Information  Last OT Received On 09/04/14  Assistance Needed +1 (bedlevel)  History of Present Illness ALEXI GEIBEL is a 64 y.o. male who complains of left hip pain after sustaining a mechanical fall at home. Pt with left intertrochanteric fx s/p IM nail. PMHx NICM, CAD, DDD, back pain, CVA, obesity  OT Time Calculation  OT Start Time (ACUTE ONLY) 1510  OT Stop Time (ACUTE ONLY) 1525  OT Time Calculation (min) 15 min  Precautions  Precautions Fall  Pain Assessment  Pain Assessment No/denies pain  Cognition  Arousal/Alertness Awake/alert  Behavior During Therapy Flat affect  Overall Cognitive Status Impaired/Different from baseline  Area of Impairment Orientation;Memory;Attention;Following commands;Safety/judgement;Problem solving;Awareness  Orientation Level Disoriented to;Time;Situation  Current Attention Level Sustained  Memory Decreased short-term memory  Following Commands Follows one step commands consistently;Follows one step commands with increased time  Safety/Judgement Decreased awareness of safety;Decreased awareness of deficits  Problem Solving Slow processing;Decreased initiation;Difficulty sequencing;Requires verbal cues;Requires tactile cues  ADL  Eating/Feeding Total assistance;Bed level  Restrictions  LLE Weight Bearing WBAT  Exercises  Exercises Other exercises;General Upper Extremity  General Exercises - Upper Extremity  Shoulder Flexion Strengthening;Both;20 reps;Seated;Bar weights/barbell  Shoulder ABduction Strengthening;Both;20 reps;Seated;Bar weights/barbell  Elbow Flexion Strengthening;Both;20 reps;Seated;Bar weights/barbell  Elbow Extension Strengthening;Both;20 reps;Seated;Bar weights/barbell  Bar Weights/Barbell (Shoulder Flexion) 1 lb  Bar Weights/Barbell (Shoulder Abduction) 1 lb  Bar Weights/Barbell (Elbow Flexion) 1 lb  Bar Weights/Barbell (Elbow Extension) 1 lb  OT - End of Session  Equipment Utilized During  Treatment Oxygen  Activity Tolerance Patient tolerated treatment well  Patient left in bed;with call bell/phone within reach;with family/visitor present;with nursing/sitter in room  OT Assessment/Plan  OT Plan Discharge plan remains appropriate  OT Frequency (ACUTE ONLY) Min 2X/week  Follow Up Recommendations SNF;Supervision/Assistance - 24 hour  OT Goal Progression  Progress towards OT goals Not progressing toward goals - comment (on CRRT)  OT General Charges  $OT Visit 1 Procedure  OT Treatments  $Therapeutic Exercise 8-22 mins  .09/04/2014 Nestor Lewandowsky, OTR/L Pager: [phone]

## 2014-09-04 NOTE — Progress Notes (Signed)
CRITICAL VALUE ALERT  Critical value received:  APTT >200  Date of notification:  09/04/2014  Time of notification:  0615  Critical value read back:Yes.    Nurse who received alert:  Mae Aninon RN  Unchanged value from previous APTT drawn.

## 2014-09-04 NOTE — Progress Notes (Signed)
CSW was asked to meet with wife by Darrick Grinder, NP as known patient from HF clinic. Wife very emotional due to progressive medical issues and decline in status. Wife shared concerns about the future and current health decline of husband. Wife spoke with her employer who she states is very supportive and assisting with short term disability/FMLA applications. Wife tearful during conversation and shared her feelings of hopelessness and lack of control of the future. CSW provided supportive intervention and encouraged wife to deal with today and the current situation. Wife appeared to feel comforted and calm down. She stated she plans to go home for a few hours this afternoon and try to get some things done and regroup. CSW will provide support as needed. Raquel Sarna, [place]

## 2014-09-04 NOTE — Progress Notes (Addendum)
ANTICOAGULATION CONSULT NOTE - Follow Up Consult  Pharmacy Consult for Heparin (while Xarelto on hold) Indication: atrial fibrillation  Allergies  Allergen Reactions  . Bydureon [Exenatide] Other (See Comments)    sweating  . Losartan Potassium Other (See Comments)    insomnia    Patient Measurements: Height: 5\' 10"  (177.8 cm) Weight: 225 lb 12 oz (102.4 kg) IBW/kg (Calculated) : 73 Heparin Dosing Weight: 96 kg  Vital Signs: Temp: 97.5 F (36.4 C) (07/27 0800) Temp Source: Oral (07/27 0800) BP: 102/52 mmHg (07/27 0900) Pulse Rate: 72 (07/27 0900)  Labs:  Recent Labs  09/02/14 0400  09/03/14 0430 09/03/14 1023 09/03/14 1628 09/04/14 0413  HGB 9.3*  --  9.7*  --   --  10.0*  HCT 29.1*  --  30.9*  --   --  31.7*  PLT 258  --  252  --   --  281  APTT  --   < > >200* >200*  --  >200*  HEPARINUNFRC  --   < > 1.10* 1.16*  --  1.50*  CREATININE 2.12*  < > 1.70*  1.58*  --  1.59* 1.47*  < > = values in this interval not displayed.  Estimated Creatinine Clearance: 62.5 mL/min (by C-G formula based on Cr of 1.47).   Medications:  Heparin on hold  Assessment: 64 YOM on Xarelto PTA for hx Afib - held in the setting of AKI requiring CRRT and transitioned to heparin. The patient is noted to have both a PICC line and a RIJ HD catheter. Heparin was infusing in the PICC line and the aPTT labs are being drawn from the RIJ - both of which are being pulled from the central blood flow. Not able to draw from peripheral site 2/2 edema.  APTT/HL remain elevated this am despite holding systemic heparin since 7/25 (still receiving with CRRT). Drawing labs from [facility] site since still too edematous to draw from peripheral site. Will again reassess aPTT/HL with am labs. CBC remains low but stable with no significant s/s bleeding reported.   Goal of Therapy:  Heparin level 0.3-0.7 units/ml aPTT 66-102 seconds Monitor platelets by anticoagulation protocol: Yes   Plan:  - Continue to hold  heparin gtt - F/u am aPTT/HL  - Monitor for bleeding  Randale Carvalho K. Velva Harman, PharmD, [place] Clinical Pharmacist Pager: [phone] Phone: [phone] 09/04/2014 10:15 AM

## 2014-09-04 NOTE — Progress Notes (Signed)
Pt has not been tolerating pulling off 75, Dr. Aundra Dubin aware and ok with pulling 25 an hour if bp tolerates.   Larry [name]

## 2014-09-04 NOTE — Progress Notes (Signed)
RT placed 40% venti mask on pt due to his sats dropping to mid 80's while sleeping. RT will continue to monitor.

## 2014-09-04 NOTE — Progress Notes (Signed)
  Called by nursing staff for hypotension and hypoglycemia.   Requiring additional oxygen. T 95. SBP in 70s Map 40s. Glucose 39.   Given amp of glucagon. Stop milrinone and start norepi. Once SBP improved add dobutamine 2.5 mcg. Stop lantus and continue sliding scale.   Check UA/CXR/CO-OX now.   Discussed with Dr Aundra Dubin and he agrees with plan.   CLEGG,AMY NP-C  1:45 PM

## 2014-09-04 NOTE — Procedures (Signed)
Arterial Catheter Insertion Procedure Note Larry [name] [id number] 02/06/1951  Procedure: Insertion of Arterial Catheter  Indications: Blood pressure monitoring and Frequent blood sampling  Procedure Details Consent: Risks of procedure as well as the alternatives and risks of each were explained to the (patient/caregiver).  Consent for procedure obtained. Time Out: Verified patient identification, verified procedure, site/side was marked, verified correct patient position, special equipment/implants available, medications/allergies/relevent history reviewed, required imaging and test results available.  Performed  Maximum sterile technique was used including antiseptics, cap, gloves, gown, hand hygiene, mask and sheet. Skin prep: Chlorhexidine; local anesthetic administered 20 gauge catheter was inserted into left radial artery using the Seldinger technique.  Evaluation Blood flow good; BP tracing good. Complications: No apparent complications.   Larry [name] 09/04/2014

## 2014-09-04 NOTE — Progress Notes (Signed)
Subjective: Larry [name] is a 64 yo male with PMHx of CKD stage III who was admitted with a left femoral intertrochanteric fracture and subsequently developed acute on chronic renal failure likely secondary to hypoperfusion.   Patient denies any complaints.   Objective: Filed Vitals:   09/04/14 0900 09/04/14 0930 09/04/14 1000 09/04/14 1030  BP: 102/52 80/36 102/38 85/35  Pulse: 72 75 69 67  Temp:      TempSrc:      Resp: 19 14 24 19   Height:      Weight:      SpO2: 96% 91% 96% 92%   General: Vital signs reviewed. Patient is in no acute distress and cooperative with exam.  Cardiovascular: RRR, S1 normal, S2 normal, no MRG Pulmonary/Chest: Mild crackles, no wheezes, or rhonchi.  Abdominal: Soft, non-tender, non-distended, BS +  Extremities: Trace pitting edema Skin: Warm, dry and intact. No rashes or erythema.  Intake/Output from previous day: 07/26 0701 - 07/27 0700 In: 1149.6 [P.O.:960; I.V.:189.6] Out: 3448 [Urine:70; Stool:352] Intake/Output this shift: Total I/O In: 83.7 [P.O.:60; I.V.:23.7] Out: 507 [Other:507]  Lab Results:  Recent Labs  09/03/14 0430 09/04/14 0413  WBC 7.4 8.9  HGB 9.7* 10.0*  HCT 30.9* 31.7*  PLT 252 281   BMET:   Recent Labs  09/03/14 1628 09/04/14 0413  NA 133* 131*  K 3.5 3.3*  CL 100* 99*  CO2 26 27  GLUCOSE 111* 119*  BUN 7 6  CREATININE 1.59* 1.47*  CALCIUM 8.1* 8.1*   CBG (last 3)   Recent Labs  09/03/14 2122 09/04/14 0756 09/04/14 0757  GLUCAP 85 >600* 386*   Studies/Results: No results found.  I have reviewed the patient's current medications. Prior to Admission:  Prescriptions prior to admission  Medication Sig Dispense Refill Last Dose  . ALPRAZolam (XANAX) 0.5 MG tablet Take 0.5 mg by mouth See admin instructions. Take 1 tablet (0.5 mg) every night, may take an additional tablet two more times during the day as needed for anxiety   08/24/2014 at Unknown time  . amiodarone (PACERONE) 200 MG tablet Take 1  tablet (200 mg total) by mouth daily. 30 tablet 3 prior to admission 08/21/14  . atorvastatin (LIPITOR) 40 MG tablet TAKE 1 TABLET DAILY 90 tablet 3 prior to admission 08/21/14  . bisacodyl (DULCOLAX) 5 MG EC tablet Take 5 mg by mouth at bedtime.   prior to admission 08/21/14  . chlorhexidine (PERIDEX) 0.12 % solution Perform mouth rinses twice daily after breakfast and at bedtime. (Patient taking differently: Use as directed in the mouth or throat 2 (two) times daily. Perform mouth rinses twice daily after breakfast and at bedtime (swish and spit)) 120 mL 0 08/25/2014 at am  . digoxin (LANOXIN) 0.125 MG tablet Take 0.5 tablets (0.0625 mg total) by mouth daily. 90 tablet 3 prior to admission 08/21/14  . DULoxetine (CYMBALTA) 30 MG capsule Take 60 mg by mouth 2 (two) times daily.    prior to admission 08/21/14  . GLUCAGON EMERGENCY 1 MG injection Inject 1 mg as directed once as needed (hypotension).    not yet needed  . hydrALAZINE (APRESOLINE) 100 MG tablet Take 1 tablet (100 mg total) by mouth 3 (three) times daily. 90 tablet 1 prior to admission 08/21/14  . insulin glargine (LANTUS) 100 unit/mL SOPN Inject 30 Units into the skin at bedtime.   prior to admission 08/21/14  . insulin lispro (HUMALOG KWIKPEN) 100 UNIT/ML KiwkPen Inject 30 Units into the skin 3 (three) times daily  before meals.   prior to admission 08/21/14  . levothyroxine (SYNTHROID, LEVOTHROID) 75 MCG tablet TAKE 1 TABLET ON AN EMPTY STOMACH 30 MINUTES BEFORE BREAKFAST  5 prior to admission 08/21/14  . Melatonin 10 MG TABS Take 10 mg by mouth at bedtime as needed (sleep).    week ago  . metoprolol succinate (TOPROL-XL) 25 MG 24 hr tablet Take 1 tablet (25 mg total) by mouth 2 (two) times daily before a meal. 60 tablet 6 prior to admission 08/21/14 at unknown time  . milrinone (PRIMACOR) 20 MG/100ML SOLN infusion Inject 51.2 mcg/min into the vein continuous. (0.73mcg/kg/min)   08/25/2014 at continuous  . Multiple Vitamin (MULITIVITAMIN WITH  MINERALS) TABS Take 1 tablet by mouth daily.   prior to admission 08/21/14  . oxyCODONE-acetaminophen (PERCOCET) 10-325 MG per tablet Take 1 tablet by mouth every 4 (four) hours as needed for pain.   0 08/25/2014 at 600  . Potassium Chloride ER 20 MEQ TBCR Take 20 mEq by mouth 2 (two) times daily. 60 tablet 0 prior to admission 08/21/14  . rivaroxaban (XARELTO) 15 MG TABS tablet Take 1 tablet (15 mg total) by mouth daily with supper. 30 tablet 3 prior to admission 08/21/14  . sildenafil (REVATIO) 20 MG tablet Take 4 tablets (80 mg total) by mouth 3 (three) times daily. 360 tablet 1 prior to admission 08/21/14  . spironolactone (ALDACTONE) 25 MG tablet TAKE 1 TABLET DAILY 90 tablet 2 prior to admission 08/21/14  . torsemide (DEMADEX) 20 MG tablet Take 3 tablets (60 mg total) by mouth 2 (two) times daily. 180 tablet 1 prior to admission 08/21/14  . zolpidem (AMBIEN) 5 MG tablet Take 1 tablet (5 mg total) by mouth at bedtime as needed for sleep. 30 tablet 0 08/24/2014 at Unknown time  . oxyCODONE-acetaminophen (PERCOCET/ROXICET) 5-325 MG per tablet Take 1-2 tablets by mouth every 4-6 hours as needed for moderate-severe pain. (Patient not taking: Reported on 08/25/2014) 40 tablet 0 Not Taking at Unknown time   Scheduled: . sodium chloride   Intravenous Once  . sodium chloride  10 mL/hr Intravenous Once  . ALPRAZolam  0.5 mg Oral QHS  . amiodarone  400 mg Oral BID  . atorvastatin  40 mg Oral q1800  . chlorhexidine  15 mL Mouth/Throat BID  . DULoxetine  30 mg Oral Daily  . insulin aspart  0-9 Units Subcutaneous TID AC & HS  . insulin glargine  15 Units Subcutaneous BID  . levothyroxine  75 mcg Oral QAC breakfast  . potassium chloride  40 mEq Oral Once  . sildenafil  80 mg Oral TID  . sodium chloride irrigation  200 mL Irrigation Q2H while awake  . sodium phosphate  Dextrose 5% IVPB  20 mmol Intravenous Once   Continuous: . sodium chloride 10 mL/hr at 08/29/14 0543  . heparin 10,000 units/ 20 mL infusion  syringe 2,400 Units/hr (09/04/14 1016)  . milrinone 0.25 mcg/kg/min (09/04/14 0505)  . dialysis replacement fluid (prismasate) 700 mL/hr at 09/04/14 1017  . dialysis replacement fluid (prismasate) 700 mL/hr at 09/04/14 1038  . dialysate (PRISMASATE) 1,500 mL/hr at 09/04/14 0904  . sodium chloride irrigation     ESP:[id number], ALPRAZolam **AND** ALPRAZolam, heparin, heparin, morphine injection, ondansetron (ZOFRAN) IV, oxyCODONE-acetaminophen **AND** oxyCODONE, sodium chloride, sodium chloride, zolpidem  Assessment/Plan:   Acute on Chronic Kidney Disease: Likely secondary to ATN with hypoperfusion, low FENa. Patient remains oliguric with -36 UOP yesterday. Creatinine has trended down from 4.0> 2.12> 1.70>1.47, in the setting of CRRT.  We will continue CRRT today and monitor. Unfortunately, it seems likely patient will end up requiring HD in the future.  -Repeat renal function panel tomorrow morning -Continue CRRT today, no changes -Fluid restrict -Strict I/Os  Hypophosphatemia: Low at 2.4 yesterday. -Replace with Phosphorous 20 mmol once -Repeat renal function panel tomorrow am  Hypokalemia: Potassium 3.3 this morning. -Kdur 40 mEq once  Hypervolemic Hyponatremia: Likely secondary to acute on chronic kidney failure and CHF. Trending back down from 135>133>131 this morning. -Repeat RFP tomorrow morning  Normocytic Anemia: Hgb stable. Likely secondary to chronic kidney disease.  -Monitor H/H intermittently   LOS: 10 days   Osa Craver, DO PGY-2 Internal Medicine Resident Pager # [phone] 09/04/2014 11:04 AM

## 2014-09-04 NOTE — Progress Notes (Signed)
[facility] PROGRESS NOTE  ASHTEN PRATS BSJ:[id number] DOB: 1950/02/15 DOA: 08/25/2014 PCP: [name], MD   Assessment/Plan:  1. Severe nonischemic cardiomyopathy with chronic systolic heart failure with an EF of 25%. History of V. tach. Patient has AICD, on home milrinone drip -Patient having a 4 kg weight gain since 08/26/2014, his weight increasing to 106.1 kg on 08/31/2014 from 102kg on admission -Patient was started on CVVHD on 08/31/2014 -Cardiology managing milrinone -He is tolerating CVVHD; but has remained oliguric -Patient having urinary output in the 85 mL range daily.  2.  Acute on chronic renal failure -Labs showing ongoing downward trend in his creatinine, i -Xarelto was discontinued and started on IV heparin -Nephrology was consulted, feeling renal failure likely represents postoperative ATN/hypoperfusion from hypotension -CVVH was started on 08/31/2014 -A.m. labs on 09/04/2014 showing creatinine of 1.4 -still oliguric -Nephrology managing  -might ended on permanent HD  3. Constipation -Patient having several large bowel movements overnight  4.  Hyponatremia -I suspect secondary to volume overload -Improved after starting CRT, sodium at 135  5. Mechanical trip and fall with left femoral intertrochanteric fracture.  -status post intramedullary nail to left hip on 7/19 -WBAT -PT/OT evaluation -Plan for discharge to SNF when medically stable  6. Paroxysmal atrial fibrillation.  -Xarelto discontinued due to worsening kidney function, now on IV heparin -On Amio  7. Obstructive sleep apnea.  -Patient refusing BiPAP  8. DM type II. recent A1c of 7.7 -continue Lantus and SSI -Blood sugars remaining in the 100 range, on Lantus 15 units subcutaneous twice a day   Code Status: Full Family Communication: wife at bedside Disposition Plan: Patient will remain on CCHF for now     Consultants:  Ortho (Dr. Percell Miller)  HF team    Nephrology  Procedures:  S/P intramedullary nail on left hip on 7/18  Antibiotics:  None   HPI/Subjective: Today reports having multiple bowel movements overnight, didn't sleep well because of that. He was given Miralax, Colace, suppository and Lactulose yesterday for constipation.   Denies shortness of breath, reports improvement to lower extremity edema. Still not eating well   Objective: Filed Vitals:   09/04/14 1230  BP: 106/42  Pulse: 61  Temp: 95 F (35 C)  Resp: 24    Intake/Output Summary (Last 24 hours) at 09/04/14 1253 Last data filed at 09/04/14 1237  Gross per 24 hour  Intake 1316.27 ml  Output   3501 ml  Net -2184.73 ml   Filed Weights   09/02/14 0500 09/03/14 0500 09/04/14 0800  Weight: 107 kg (235 lb 14.3 oz) 104.4 kg (230 lb 2.6 oz) 102.4 kg (225 lb 12 oz)    Exam:   General:  He is awake and alert, oriented, follows commands, chronically ill-appearing   Cardiovascular:norubs, no gallops, RRR  Respiratory: Normal respiratory effort, lungs are clear to auscultation bilaterally  Abdomen: soft, NT, ND, positive Bowel sounds  Musculoskeletal: Surgical incision site appears clean, no evidence of infection. here is significant improvement to lower extremity pitting edema  Data Reviewed: Basic Metabolic Panel:  Recent Labs Lab 08/31/14 2345 09/01/14 0425  09/01/14 1620 09/02/14 0400 09/02/14 1715 09/03/14 0430 09/03/14 1628 09/04/14 0413  NA  --   --   < > 133* 131* 134* 135  133* 133* 131*  K 4.8  --   < > 4.7 4.4 3.9 3.6  3.5 3.5 3.3*  CL  --   --   < > 100* 98* 101 100*  98* 100* 99*  CO2  --   --   < > 26 26 27 27  26 26 27   GLUCOSE  --   --   < > 121* 174* 123* 116*  156* 111* 119*  BUN  --   --   < > 23* 19 13 9  9 7 6   CREATININE  --   --   < > 1.97* 2.12* 1.81* 1.70*  1.58* 1.59* 1.47*  CALCIUM  --   --   < > 8.8* 8.0* 8.2* 8.2*  8.2* 8.1* 8.1*  MG 2.5* 2.3  --   --  2.4  --  2.5*  --  2.3  PHOS  --   --   < > 2.3*  2.4* 3.4 2.8 2.4*  --   < > = values in this interval not displayed. CBC:  Recent Labs Lab 08/30/14 1744 08/31/14 0347 09/01/14 0425 09/02/14 0400 09/03/14 0430 09/04/14 0413  WBC 7.5 7.1 6.0 7.5 7.4 8.9  NEUTROABS 5.4  --   --   --   --   --   HGB 9.2* 9.2* 9.2* 9.3* 9.7* 10.0*  HCT 28.5* 28.2* 28.1* 29.1* 30.9* 31.7*  MCV 94.1 94.3 94.6 98.6 99.7 98.8  PLT 258 274 258 258 252 281   BNP (last 3 results)  Recent Labs  05/22/14 1029 05/27/14 1126 06/19/14 1030  BNP 935.2* 1627.8* 738.9*    ProBNP (last 3 results)  Recent Labs  10/09/13 0926 11/14/13 0926 01/14/14 1112  PROBNP 1561.0* 1261.0* 5769.0*   CBG:  Recent Labs Lab 09/03/14 1231 09/03/14 1622 09/03/14 2122 09/04/14 0756 09/04/14 0757  GLUCAP 89 86 85 >600* 386*    Recent Results (from the past 240 hour(s))  Urine culture     Status: None   Collection Time: 08/25/14  4:12 PM  Result Value Ref Range Status   Specimen Description URINE, CLEAN CATCH  Final   Special Requests NONE  Final   Culture NO GROWTH 1 DAY  Final   Report Status 08/26/2014 FINAL  Final  Clostridium Difficile by PCR (not at [facility])     Status: None   Collection Time: 09/02/14 10:27 PM  Result Value Ref Range Status   C difficile by pcr NEGATIVE NEGATIVE Final     Studies: No results found.  Scheduled Meds: . sodium chloride   Intravenous Once  . sodium chloride  10 mL/hr Intravenous Once  . ALPRAZolam  0.5 mg Oral QHS  . amiodarone  400 mg Oral BID  . atorvastatin  40 mg Oral q1800  . chlorhexidine  15 mL Mouth/Throat BID  . DULoxetine  30 mg Oral Daily  . insulin aspart  0-9 Units Subcutaneous TID AC & HS  . insulin glargine  15 Units Subcutaneous BID  . levothyroxine  75 mcg Oral QAC breakfast  . potassium chloride  40 mEq Oral Once  . sildenafil  80 mg Oral TID  . sodium chloride irrigation  200 mL Irrigation Q2H while awake  . sodium phosphate  Dextrose 5% IVPB  20 mmol Intravenous Once   Continuous  Infusions: . sodium chloride 10 mL/hr at 08/29/14 0543  . heparin 10,000 units/ 20 mL infusion syringe 2,350 Units/hr (09/04/14 1234)  . milrinone 0.25 mcg/kg/min (09/04/14 0505)  . dialysis replacement fluid (prismasate) 700 mL/hr at 09/04/14 1017  . dialysis replacement fluid (prismasate) 700 mL/hr at 09/04/14 1038  . dialysate (PRISMASATE) 1,500 mL/hr at 09/04/14 1243  . sodium chloride irrigation  Principal Problem:   Fracture, intertrochanteric, left femur Active Problems:   Essential hypertension, benign   Persistent atrial fibrillation   Automatic implantable cardioverter-defibrillator in situ   DM2 (diabetes mellitus, type 2)   OSA (obstructive sleep apnea), intolerant to CPAP/BIPAP   Ventricular tachycardia   Chronic systolic CHF (congestive heart failure)   Hip fracture   Left femoral shaft fracture   Acute on chronic renal failure   Hyponatremia   Acute on chronic systolic CHF (congestive heart failure)    Time spent: 25 minutes   Barton Dubois  [facility] Pager [phone]. If 7PM-7AM, please contact night-coverage at [email or web address], password [facility] 09/04/2014, 12:53 PM  LOS: 10 days

## 2014-09-04 NOTE — Progress Notes (Addendum)
Patient ID: Larry [name], male   DOB: Jan 25, 1951, 64 y.o.   MRN: [id number] Advanced Heart Failure Rounding Note   Subjective:    Larry [name] is a 64 y/o man with history of chronic systolic CHF (nonischemic cardiomyopathy) s/p 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 He is on chronic milrinone 0.5 mcg.  Prior to admit he was being considered for LVAD.   Dischaged 7/16 after dental procedures. Also had RHC with elevate pulmonary pressures. Revatio was increased to 80 mg tid during that stay.  Admitted with fall---> broken L  hip . 7/18 had IM Nail L femur.  7/23 started CVVHD.   Tolerating CVVHD. Awake and oriented this morning.  Still some confusion after he gets pain meds.  Milrinone decreased to 0.25 with NSVT, still with PVCs but no VT.  Co-ox 69% this morning.  CVP 13.  70 cc UOP yesterday. No dyspnea.  UF at 75 cc/hr.    Objective:   Weight Range:  Vital Signs:   Temp:  [96 F (35.6 C)-97.8 F (36.6 C)] 97.6 F (36.4 C) (07/27 0400) Pulse Rate:  [71-81] 71 (07/27 0700) Resp:  [13-22] 13 (07/27 0700) BP: (74-131)/(27-59) 116/55 mmHg (07/27 0700) SpO2:  [93 %-99 %] 97 % (07/27 0700) Weight:  [225 lb 12 oz (102.4 kg)] 225 lb 12 oz (102.4 kg) (07/27 0800) Last BM Date: 09/03/14  Weight change: Filed Weights   09/02/14 0500 09/03/14 0500 09/04/14 0800  Weight: 235 lb 14.3 oz (107 kg) 230 lb 2.6 oz (104.4 kg) 225 lb 12 oz (102.4 kg)    Intake/Output:   Intake/Output Summary (Last 24 hours) at 09/04/14 0824 Last data filed at 09/04/14 0800  Gross per 24 hour  Intake 1081.7 ml  Output   3577 ml  Net -2495.3 ml     Physical Exam: General:  NAD  HEENT: normal Neck: supple. JVP 10 cm. RIJ trialysis catheter.  Carotids 2+ bilat; no bruits. No lymphadenopathy or thryomegaly appreciated. Cor: PMI nondisplaced. Regular rate & rhythm. No rubs, gallops or murmurs. Lungs: Diminished both bases  with mild crackles Abdomen: soft, nontender, nondistended. No hepatosplenomegaly. No bruits or masses. Good bowel sounds. Extremities: no cyanosis, clubbing, rash. RUE PICC dual lumen . R Thigh lateral aspect dressing intact x2. 1+ ankle edema. Neuro: Awake. Lethargic but conversant,  Cranial nerves grossly intact. Affect pleasant. Moves extremities without difficulty. L leg tender.  Telemetry: NSR with PVCs  Labs: Basic Metabolic Panel:  Recent Labs Lab 08/31/14 2345 09/01/14 0425  09/01/14 1620 09/02/14 0400 09/02/14 1715 09/03/14 0430 09/03/14 1628 09/04/14 0413  NA  --   --   < > 133* 131* 134* 135  133* 133* 131*  K 4.8  --   < > 4.7 4.4 3.9 3.6  3.5 3.5 3.3*  CL  --   --   < > 100* 98* 101 100*  98* 100* 99*  CO2  --   --   < > 26 26 27 27  26 26 27   GLUCOSE  --   --   < > 121* 174* 123* 116*  156* 111* 119*  BUN  --   --   < > 23* 19 13 9  9 7 6   CREATININE  --   --   < > 1.97* 2.12* 1.81* 1.70*  1.58* 1.59* 1.47*  CALCIUM  --   --   < > 8.8* 8.0*  8.2* 8.2*  8.2* 8.1* 8.1*  MG 2.5* 2.3  --   --  2.4  --  2.5*  --  2.3  PHOS  --   --   < > 2.3* 2.4* 3.4 2.8 2.4*  --   < > = values in this interval not displayed.  Liver Function Tests:  Recent Labs Lab 09/02/14 0400 09/02/14 1715 09/03/14 0430 09/03/14 1628 09/04/14 0413  AST  --   --   --   --  26  ALT  --   --   --   --  10*  ALKPHOS  --   --   --   --  68  BILITOT  --   --   --   --  1.1  PROT  --   --   --   --  6.7  ALBUMIN 2.9* 2.7* 2.8* 2.7* 2.7*   No results for input(s): LIPASE, AMYLASE in the last 168 hours. No results for input(s): AMMONIA in the last 168 hours.  CBC:  Recent Labs Lab 08/30/14 1744 08/31/14 0347 09/01/14 0425 09/02/14 0400 09/03/14 0430 09/04/14 0413  WBC 7.5 7.1 6.0 7.5 7.4 8.9  NEUTROABS 5.4  --   --   --   --   --   HGB 9.2* 9.2* 9.2* 9.3* 9.7* 10.0*  HCT 28.5* 28.2* 28.1* 29.1* 30.9* 31.7*  MCV 94.1 94.3 94.6 98.6 99.7 98.8  PLT 258 274 258 258 252 281     Cardiac Enzymes: No results for input(s): CKTOTAL, CKMB, CKMBINDEX, TROPONINI in the last 168 hours.  BNP: BNP (last 3 results)  Recent Labs  05/22/14 1029 05/27/14 1126 06/19/14 1030  BNP 935.2* 1627.8* 738.9*    ProBNP (last 3 results)  Recent Labs  10/09/13 0926 11/14/13 0926 01/14/14 1112  PROBNP 1561.0* 1261.0* 5769.0*      Other results:  Imaging: No results found.   Medications:     Scheduled Medications: . sodium chloride   Intravenous Once  . sodium chloride  10 mL/hr Intravenous Once  . ALPRAZolam  0.5 mg Oral QHS  . amiodarone  400 mg Oral BID  . atorvastatin  40 mg Oral q1800  . chlorhexidine  15 mL Mouth/Throat BID  . DULoxetine  30 mg Oral Daily  . insulin aspart  0-9 Units Subcutaneous TID AC & HS  . insulin glargine  15 Units Subcutaneous BID  . levothyroxine  75 mcg Oral QAC breakfast  . sildenafil  80 mg Oral TID  . sodium chloride irrigation  200 mL Irrigation Q2H while awake    Infusions: . sodium chloride 10 mL/hr at 08/29/14 0543  . heparin 10,000 units/ 20 mL infusion syringe 2,300 Units/hr (09/04/14 0700)  . milrinone 0.25 mcg/kg/min (09/04/14 0505)  . dialysis replacement fluid (prismasate) 700 mL/hr at 09/04/14 0313  . dialysis replacement fluid (prismasate) 700 mL/hr at 09/04/14 0313  . dialysate (PRISMASATE) 1,500 mL/hr at 09/04/14 0547  . sodium chloride irrigation      PRN Medications: albuterol, ALPRAZolam **AND** ALPRAZolam, heparin, heparin, morphine injection, ondansetron (ZOFRAN) IV, oxyCODONE-acetaminophen **AND** oxyCODONE, sodium chloride, sodium chloride, zolpidem   Assessment:  1. Left Hip Fracture s/p IM nail on 08/26/14 2. Chronic systolic CHF- Nonischemic cardiomyopathy, on home milrinone 0.5 mcg/kg/min, decreased to 0.25 here with NSVT.  3. Acute on chronic renal failure - suspect post-op ATN  4.  Paroxysmal atrial fibrillation requiring DCCV 02/2014, 04/2014- on chronic amio and xarelto. CHADS VASc  Score- 5.  Remains in  NSR.  5. Diabetes mellitus type 2 6. Depression/anxiety 7. OSA, complex- Needs Bipap at night but had difficulty after dental procedure.  8 Pulmonary HTN - Mixed pulmonary venous hypertension and PAH- on Revatio 80 mg tid 9. Dental extractions 08/2014 by Dr Enrique Sack 10. Obesity Body mass index is 32.39 kg/(m^2). 11. Probable chronic respiratory failure - requiring home O2 with ambulation as of this admission 12. Hyponatremia   13. NSVT 14. Delirium  Plan/Discussion:    Now on CVVHD. Suspect post-op ATN, oliguric (70 cc/24 hrs).  CVP remains mildly elevated at 13. Fluid removal at 75 cc/hr, Awaiting renal recovery.  BP now stable, would see if we can transition him over to HD to allow more mobility, await nephrology evaluation this morning.  Continue milrinone at 0.25 (was on 0.5, decreased with NSVT and frequent ectopy). Milrinone ok for now while on CVVH, but probably not ideal long-term if he requires HD (?switch to dobutamine if he will need long-term HD).  Co-ox remains 69%, K and Mag ok.  Reloading amiodarone with ventricular ectopy at 400 mg bid.   Sinus rhythm with PVCs, no atrial fibrillation. Long PR interval noted on EKG. Continue amiodarone. Off Metoprolol XL with soft BP. Off Xarelto due to renal failure. Now on heparin gtt. Pharmacy following.   Length of Stay: [place] Larry Struble,MD 8:24 AM Advanced Heart Failure Team Pager [phone] (M-F; 7a - 4p)  Please contact [facility] for night-coverage after hours (4p -7a ) and weekends on [email or web address]  Discussed with Dr Justin Mend, one more day CVVH to watch BP.   Loralie Champagne 09/04/2014

## 2014-09-04 NOTE — Progress Notes (Signed)
Physical Therapy Treatment Patient Details Name: Larry [name] MRN: [id number] DOB: 09/22/1950 Today's Date: 09/04/2014    History of Present Illness Larry [name] is a 64 y.o. male who complains of left hip pain after sustaining a mechanical fall at home. Pt with left intertrochanteric fx s/p IM nail. PMHx NICM, CAD, DDD, back pain, CVA, obesity    PT Comments    Pt participating well with LE there ex. Resistance activities on right side, AAROM on left. Pt tolerated bed in chair position in preparation for sitting EOB when cleared by MD. PT will continue to follow.   Follow Up Recommendations  SNF;Supervision for mobility/OOB     Equipment Recommendations  Rolling walker with 5" wheels;3in1 (PT)    Recommendations for Other Services       Precautions / Restrictions Precautions Precautions: Fall Restrictions Weight Bearing Restrictions: Yes LLE Weight Bearing: Weight bearing as tolerated    Mobility  Bed Mobility               General bed mobility comments: MD requested bed level exercises  Transfers                 General transfer comment: MD requested bed level exercises  Ambulation/Gait             General Gait Details: unable   Stairs            Wheelchair Mobility    Modified Rankin (Stroke Patients Only)       Balance                                    Cognition Arousal/Alertness: Lethargic;Suspect due to medications Behavior During Therapy: Flat affect Overall Cognitive Status: Impaired/Different from baseline Area of Impairment: Orientation;Memory;Attention;Following commands;Safety/judgement;Problem solving;Awareness Orientation Level: Disoriented to;Time;Situation Current Attention Level: Focused Memory: Decreased short-term memory Following Commands: Follows one step commands consistently;Follows one step commands with increased time       General Comments: pt appropriate during conversation  today and following directional cues, sometimes with increased time.     Exercises General Exercises - Lower Extremity Ankle Circles/Pumps: Strengthening;Both;AROM;20 reps;Supine Quad Sets: AROM;Both;10 reps;Supine Gluteal Sets: AROM;10 reps;Supine Short Arc Quad: AROM;Right;10 reps;Supine Heel Slides: AROM;Right;5 reps;Supine;AAROM;Left Hip ABduction/ADduction: AAROM;Left;10 reps;Supine Straight Leg Raises: AROM;AAROM;Right;Left;10 reps;Supine Other Exercises Other Exercises: manual resistance given during exercises on right side, including resistance for hip ext, knee ext, hip flex Other Exercises: hand strengthening combined with LE stretching with 5x pull right knee into chest with 5 sec hold Other Exercises: isometric hip ext LLE 5x    General Comments General comments (skin integrity, edema, etc.): Placed pt bed in chair position after exercises in preparation for sitting EOB when allowed. Pt tolerated flexion at left hip though his neck bothered him in this position. Reclined slightly and pt able to tolerate.       Pertinent Vitals/Pain Pain Assessment: No/denies pain Pain Score: 7  Pain Location: left hip Pain Descriptors / Indicators: Aching Pain Intervention(s): Limited activity within patient's tolerance;Monitored during session;Repositioned  VSS    Home Living                      Prior Function            PT Goals (current goals can now be found in the care plan section) Acute Rehab PT Goals Patient Stated Goal: return home able to care  for himself PT Goal Formulation: With family Time For Goal Achievement: 09/10/14 Potential to Achieve Goals: Fair Progress towards PT goals: Progressing toward goals    Frequency  Min 3X/week    PT Plan Current plan remains appropriate    Co-evaluation             End of Session Equipment Utilized During Treatment: Oxygen Activity Tolerance: Patient limited by fatigue;Patient limited by lethargy Patient  left: in bed;with call bell/phone within reach;with family/visitor present     Time: 1610-9604 PT Time Calculation (min) (ACUTE ONLY): 35 min  Charges:  $Therapeutic Exercise: 23-37 mins                    G Codes:     Leighton Roach, PT  Acute Rehab Services  [phone]  Leighton Roach 09/04/2014, 3:31 PM

## 2014-09-05 DIAGNOSIS — R0602 Shortness of breath: Secondary | ICD-10-CM

## 2014-09-05 LAB — RENAL FUNCTION PANEL
ANION GAP: 8 (ref 5–15)
Albumin: 2.7 g/dL — ABNORMAL LOW (ref 3.5–5.0)
Albumin: 2.7 g/dL — ABNORMAL LOW (ref 3.5–5.0)
Anion gap: 8 (ref 5–15)
BUN: 5 mg/dL — AB (ref 6–20)
BUN: <5 mg/dL — ABNORMAL LOW (ref 6–20)
CHLORIDE: 98 mmol/L — AB (ref 101–111)
CO2: 26 mmol/L (ref 22–32)
CO2: 27 mmol/L (ref 22–32)
CREATININE: 1.69 mg/dL — AB (ref 0.61–1.24)
Calcium: 8 mg/dL — ABNORMAL LOW (ref 8.9–10.3)
Calcium: 8.2 mg/dL — ABNORMAL LOW (ref 8.9–10.3)
Chloride: 98 mmol/L — ABNORMAL LOW (ref 101–111)
Creatinine, Ser: 1.7 mg/dL — ABNORMAL HIGH (ref 0.61–1.24)
GFR calc Af Amer: 48 mL/min — ABNORMAL LOW (ref >60)
GFR calc Af Amer: 48 mL/min — ABNORMAL LOW (ref >60)
GFR calc non Af Amer: 42 mL/min — ABNORMAL LOW (ref >60)
GFR, EST NON AFRICAN AMERICAN: 41 mL/min — AB (ref >60)
Glucose, Bld: 120 mg/dL — ABNORMAL HIGH (ref 65–99)
Glucose, Bld: 127 mg/dL — ABNORMAL HIGH (ref 65–99)
PHOSPHORUS: 2.3 mg/dL — AB (ref 2.5–4.6)
Phosphorus: 3.2 mg/dL (ref 2.5–4.6)
Potassium: 4 mmol/L (ref 3.5–5.1)
Potassium: 4.1 mmol/L (ref 3.5–5.1)
SODIUM: 132 mmol/L — AB (ref 135–145)
SODIUM: 133 mmol/L — AB (ref 135–145)

## 2014-09-05 LAB — CBC
HCT: 31.4 % — ABNORMAL LOW (ref 39.0–52.0)
HEMOGLOBIN: 10.2 g/dL — AB (ref 13.0–17.0)
MCH: 32 pg (ref 26.0–34.0)
MCHC: 32.5 g/dL (ref 30.0–36.0)
MCV: 98.4 fL (ref 78.0–100.0)
Platelets: 254 10*3/uL (ref 150–400)
RBC: 3.19 MIL/uL — ABNORMAL LOW (ref 4.22–5.81)
RDW: 15.8 % — ABNORMAL HIGH (ref 11.5–15.5)
WBC: 9.5 10*3/uL (ref 4.0–10.5)

## 2014-09-05 LAB — BASIC METABOLIC PANEL
Anion gap: 6 (ref 5–15)
CO2: 27 mmol/L (ref 22–32)
CREATININE: 1.62 mg/dL — AB (ref 0.61–1.24)
Calcium: 8 mg/dL — ABNORMAL LOW (ref 8.9–10.3)
Chloride: 99 mmol/L — ABNORMAL LOW (ref 101–111)
GFR calc non Af Amer: 44 mL/min — ABNORMAL LOW (ref >60)
GFR, EST AFRICAN AMERICAN: 51 mL/min — AB (ref >60)
Glucose, Bld: 120 mg/dL — ABNORMAL HIGH (ref 65–99)
POTASSIUM: 4 mmol/L (ref 3.5–5.1)
Sodium: 132 mmol/L — ABNORMAL LOW (ref 135–145)

## 2014-09-05 LAB — GLUCOSE, CAPILLARY
Glucose-Capillary: 199 mg/dL — ABNORMAL HIGH (ref 65–99)
Glucose-Capillary: 108 mg/dL — ABNORMAL HIGH (ref 65–99)
Glucose-Capillary: 117 mg/dL — ABNORMAL HIGH (ref 65–99)
Glucose-Capillary: 133 mg/dL — ABNORMAL HIGH (ref 65–99)
Glucose-Capillary: 111 mg/dL — ABNORMAL HIGH (ref 65–99)
Glucose-Capillary: 138 mg/dL — ABNORMAL HIGH (ref 65–99)

## 2014-09-05 LAB — POCT ACTIVATED CLOTTING TIME
ACTIVATED CLOTTING TIME: 208 s
ACTIVATED CLOTTING TIME: 220 s
ACTIVATED CLOTTING TIME: 227 s
Activated Clotting Time: 220 seconds
Activated Clotting Time: 214 seconds
Activated Clotting Time: 202 seconds
Activated Clotting Time: 214 seconds
Activated Clotting Time: 214 seconds

## 2014-09-05 LAB — CARBOXYHEMOGLOBIN
CARBOXYHEMOGLOBIN: 1.8 % — AB (ref 0.5–1.5)
Methemoglobin: 1.1 % (ref 0.0–1.5)
O2 Saturation: 80.6 %
TOTAL HEMOGLOBIN: 10.3 g/dL — AB (ref 13.5–18.0)

## 2014-09-05 LAB — HEPARIN LEVEL (UNFRACTIONATED)
Heparin Unfractionated: 0.3 IU/mL (ref 0.30–0.70)
Heparin Unfractionated: 0.48 IU/mL (ref 0.30–0.70)

## 2014-09-05 LAB — APTT
aPTT: >200 seconds (ref 24–37)
aPTT: 127 seconds — ABNORMAL HIGH (ref 24–37)

## 2014-09-05 LAB — MAGNESIUM: Magnesium: 2.2 mg/dL (ref 1.7–2.4)

## 2014-09-05 MED ORDER — SODIUM PHOSPHATE 3 MMOLE/ML IV SOLN
20.0000 mmol | Freq: Once | INTRAVENOUS | Status: AC
  Administered 2014-09-05: 20 mmol via INTRAVENOUS
  Filled 2014-09-05: qty 6.67

## 2014-09-05 MED ORDER — INSULIN GLARGINE 100 UNIT/ML ~~LOC~~ SOLN
5.0000 [IU] | Freq: Every day | SUBCUTANEOUS | Status: DC
  Filled 2014-09-05 (×8): qty 0.05

## 2014-09-05 MED ORDER — NEPRO/CARBSTEADY PO LIQD
237.0000 mL | Freq: Two times a day (BID) | ORAL | Status: DC
  Administered 2014-09-06: 120 mL via ORAL
  Administered 2014-09-13 – 2014-09-28 (×17): 237 mL via ORAL
  Filled 2014-09-05 (×52): qty 237

## 2014-09-05 NOTE — Progress Notes (Signed)
Subjective: Larry [name] is a 64 yo male with PMHx of CKD stage III who was admitted with a left femoral intertrochanteric fracture and subsequently developed acute on chronic renal failure likely secondary to hypoperfusion. Patient was seen and examined this morning and states that he feels better than yesterday. He denies shortness of breath.  Objective: Filed Vitals:   09/05/14 0737 09/05/14 0800 09/05/14 0900 09/05/14 1000  BP:      Pulse:  69 68 68  Temp: 97.1 F (36.2 C)     TempSrc: Oral     Resp:  15 19 15   Height:      Weight:      SpO2:  100% 100% 100%   General: Vital signs reviewed. Patient is in no acute distress and cooperative with exam.  Cardiovascular: RRR, S1 normal, S2 normal, no MRG Pulmonary/Chest: Mild crackles, no wheezes, or rhonchi.  Abdominal: Soft, non-tender, non-distended, BS +  Extremities: Trace pitting edema Skin: Warm, dry and intact. No rashes or erythema.  Intake/Output from previous day: 07/27 0701 - 07/28 0700 In: 1175.1 [P.O.:470; I.V.:448.4; IV Piggyback:256.7] Out: 2156 [Urine:25; Stool:80] Intake/Output this shift: Total I/O In: 190.4 [P.O.:120; I.V.:27.4; IV Piggyback:43] Out: 135 [Other:135]  Lab Results:  Recent Labs  09/04/14 0413 09/05/14 0415  WBC 8.9 9.5  HGB 10.0* 10.2*  HCT 31.7* 31.4*  PLT 281 254   BMET:   Recent Labs  09/04/14 1652 09/05/14 0415  NA 133* 132*  132*  K 3.9 4.0  4.0  CL 97* 99*  98*  CO2 26 27  26   GLUCOSE 152* 120*  120*  BUN <5* <5*  5*  CREATININE 1.24 1.62*  1.69*  CALCIUM 7.7* 8.0*  8.0*   CBG (last 3)   Recent Labs  09/04/14 2006 09/04/14 2213 09/05/14 0740  GLUCAP 199* 185* 108*   Studies/Results: Dg Chest Port 1 View  09/04/2014   CLINICAL DATA:  Shortness of breath/hypoxia  EXAM: PORTABLE CHEST - 1 VIEW  COMPARISON:  August 31, 2014  FINDINGS: Central catheter tip is in the superior vena cava. No pneumothorax. Pacemaker leads are attached to the right atrium and  right ventricle. Heart is prominent but stable. The pulmonary vascularity is normal. No edema or consolidation. No adenopathy.  IMPRESSION: No edema or consolidation. No change in cardiac silhouette. No pneumothorax.   Electronically Signed   By: Lowella Grip III M.D.   On: 09/04/2014 14:21    I have reviewed the patient's current medications. Prior to Admission:  Prescriptions prior to admission  Medication Sig Dispense Refill Last Dose  . ALPRAZolam (XANAX) 0.5 MG tablet Take 0.5 mg by mouth See admin instructions. Take 1 tablet (0.5 mg) every night, may take an additional tablet two more times during the day as needed for anxiety   08/24/2014 at Unknown time  . amiodarone (PACERONE) 200 MG tablet Take 1 tablet (200 mg total) by mouth daily. 30 tablet 3 prior to admission 08/21/14  . atorvastatin (LIPITOR) 40 MG tablet TAKE 1 TABLET DAILY 90 tablet 3 prior to admission 08/21/14  . bisacodyl (DULCOLAX) 5 MG EC tablet Take 5 mg by mouth at bedtime.   prior to admission 08/21/14  . chlorhexidine (PERIDEX) 0.12 % solution Perform mouth rinses twice daily after breakfast and at bedtime. (Patient taking differently: Use as directed in the mouth or throat 2 (two) times daily. Perform mouth rinses twice daily after breakfast and at bedtime (swish and spit)) 120 mL 0 08/25/2014 at am  .  digoxin (LANOXIN) 0.125 MG tablet Take 0.5 tablets (0.0625 mg total) by mouth daily. 90 tablet 3 prior to admission 08/21/14  . DULoxetine (CYMBALTA) 30 MG capsule Take 60 mg by mouth 2 (two) times daily.    prior to admission 08/21/14  . GLUCAGON EMERGENCY 1 MG injection Inject 1 mg as directed once as needed (hypotension).    not yet needed  . hydrALAZINE (APRESOLINE) 100 MG tablet Take 1 tablet (100 mg total) by mouth 3 (three) times daily. 90 tablet 1 prior to admission 08/21/14  . insulin glargine (LANTUS) 100 unit/mL SOPN Inject 30 Units into the skin at bedtime.   prior to admission 08/21/14  . insulin lispro (HUMALOG  KWIKPEN) 100 UNIT/ML KiwkPen Inject 30 Units into the skin 3 (three) times daily before meals.   prior to admission 08/21/14  . levothyroxine (SYNTHROID, LEVOTHROID) 75 MCG tablet TAKE 1 TABLET ON AN EMPTY STOMACH 30 MINUTES BEFORE BREAKFAST  5 prior to admission 08/21/14  . Melatonin 10 MG TABS Take 10 mg by mouth at bedtime as needed (sleep).    week ago  . metoprolol succinate (TOPROL-XL) 25 MG 24 hr tablet Take 1 tablet (25 mg total) by mouth 2 (two) times daily before a meal. 60 tablet 6 prior to admission 08/21/14 at unknown time  . milrinone (PRIMACOR) 20 MG/100ML SOLN infusion Inject 51.2 mcg/min into the vein continuous. (0.30mcg/kg/min)   08/25/2014 at continuous  . Multiple Vitamin (MULITIVITAMIN WITH MINERALS) TABS Take 1 tablet by mouth daily.   prior to admission 08/21/14  . oxyCODONE-acetaminophen (PERCOCET) 10-325 MG per tablet Take 1 tablet by mouth every 4 (four) hours as needed for pain.   0 08/25/2014 at 600  . Potassium Chloride ER 20 MEQ TBCR Take 20 mEq by mouth 2 (two) times daily. 60 tablet 0 prior to admission 08/21/14  . rivaroxaban (XARELTO) 15 MG TABS tablet Take 1 tablet (15 mg total) by mouth daily with supper. 30 tablet 3 prior to admission 08/21/14  . sildenafil (REVATIO) 20 MG tablet Take 4 tablets (80 mg total) by mouth 3 (three) times daily. 360 tablet 1 prior to admission 08/21/14  . spironolactone (ALDACTONE) 25 MG tablet TAKE 1 TABLET DAILY 90 tablet 2 prior to admission 08/21/14  . torsemide (DEMADEX) 20 MG tablet Take 3 tablets (60 mg total) by mouth 2 (two) times daily. 180 tablet 1 prior to admission 08/21/14  . zolpidem (AMBIEN) 5 MG tablet Take 1 tablet (5 mg total) by mouth at bedtime as needed for sleep. 30 tablet 0 08/24/2014 at Unknown time  . oxyCODONE-acetaminophen (PERCOCET/ROXICET) 5-325 MG per tablet Take 1-2 tablets by mouth every 4-6 hours as needed for moderate-severe pain. (Patient not taking: Reported on 08/25/2014) 40 tablet 0 Not Taking at Unknown time    Scheduled: . sodium chloride   Intravenous Once  . sodium chloride  10 mL/hr Intravenous Once  . ALPRAZolam  0.5 mg Oral QHS  . amiodarone  400 mg Oral BID  . atorvastatin  40 mg Oral q1800  . chlorhexidine  15 mL Mouth/Throat BID  . DULoxetine  30 mg Oral Daily  . feeding supplement (NEPRO CARB STEADY)  237 mL Oral BID BM  . insulin aspart  0-9 Units Subcutaneous TID AC & HS  . insulin glargine  5 Units Subcutaneous QHS  . levothyroxine  75 mcg Oral QAC breakfast  . potassium chloride  40 mEq Oral Once  . sildenafil  80 mg Oral TID  . sodium chloride irrigation  200 mL Irrigation Q2H while awake  . sodium phosphate  Dextrose 5% IVPB  20 mmol Intravenous Once   Continuous: . sodium chloride 10 mL/hr at 08/29/14 0543  . DOBUTamine 2.5 mcg/kg/min (09/05/14 0800)  . heparin 10,000 units/ 20 mL infusion syringe 1,700 Units/hr (09/05/14 0800)  . norepinephrine (LEVOPHED) Adult infusion Stopped (09/05/14 0615)  . dialysis replacement fluid (prismasate) 700 mL/hr at 09/05/14 0851  . dialysis replacement fluid (prismasate) 700 mL/hr at 09/05/14 0841  . dialysate (PRISMASATE) 1,500 mL/hr at 09/05/14 0845  . sodium chloride irrigation     HKU:VJDYN/XGZFPOIP arterial line **AND** sodium chloride, albuterol, ALPRAZolam **AND** ALPRAZolam, heparin, heparin, morphine injection, ondansetron (ZOFRAN) IV, oxyCODONE-acetaminophen **AND** oxyCODONE, sodium chloride, sodium chloride, zolpidem  Assessment/Plan:   Acute on Chronic Kidney Disease: Likely secondary to ATN with hypoperfusion, low FENa. Patient remains oliguric with -25 UOP yesterday. Creatinine has trended down from 4.0> 2.12> 1.70>1.47>1.62, in the setting of CRRT. Patient's BP was stable this morning off of pressors and CRRT pulling 70 cc/hr. We will continue CRRT. -Continue CRRT -Repeat renal function panel tomorrow morning -Fluid restrict -Strict I/Os  Hypophosphatemia: Low at 2.3 this morning. -Replace with Phosphorous 20 mmol  once -Repeat renal function panel tomorrow am  Hypokalemia: Resolved. Potassium 4.0 this morning. -Repeat RFP tomorrow am  Hypervolemic Hyponatremia: Likely secondary to acute on chronic kidney failure and CHF. Trending 189>842>103 this morning. -Repeat RFP tomorrow morning  Normocytic Anemia: Hgb stable. Likely secondary to chronic kidney disease.  -Monitor H/H intermittently   LOS: 11 days   Osa Craver, DO PGY-2 Internal Medicine Resident Pager # [phone] 09/05/2014 10:54 AM

## 2014-09-05 NOTE — Progress Notes (Signed)
  Critical value received: aPTT >200  Date of notification: 09/05/2014  Time of notification: 2876  Critical value read back:Yes.    Unchanged value from aPTT on 09/04/2014. MD aware.

## 2014-09-05 NOTE — Progress Notes (Signed)
Pt refuses to wear Bipap tonight. Pt's wife explained that he was supposed to receive his Bipap machine from home health around the time he was admitted to the hospital. Pt has just been newly diagnosed with sleep apnea. Wife stated he had attempted to wear Bipap several evenings ago and only wore for approximately 15 mintues. Pt states he does not wish to wear tonight. Pt resting well in no distress. RT will continue to monitor.

## 2014-09-05 NOTE — Progress Notes (Signed)
ANTICOAGULATION CONSULT NOTE - Follow Up Consult  Pharmacy Consult for Heparin (while Xarelto on hold) Indication: atrial fibrillation  Allergies  Allergen Reactions  . Bydureon [Exenatide] Other (See Comments)    sweating  . Losartan Potassium Other (See Comments)    insomnia    Patient Measurements: Height: 5\' 10"  (177.8 cm) Weight: 222 lb 14.2 oz (101.1 kg) IBW/kg (Calculated) : 73 Heparin Dosing Weight: 96 kg  Vital Signs: Temp: 97.1 F (36.2 C) (07/28 0737) Temp Source: Oral (07/28 0737) Pulse Rate: 70 (07/28 1100)  Labs:  Recent Labs  09/03/14 0430 09/03/14 1023  09/04/14 0413 09/04/14 1652 09/05/14 0415  HGB 9.7*  --   --  10.0*  --  10.2*  HCT 30.9*  --   --  31.7*  --  31.4*  PLT 252  --   --  281  --  254  APTT >200* >200*  --  >200*  --  >200*  HEPARINUNFRC 1.10* 1.16*  --  1.50*  --  0.30  CREATININE 1.70*  1.58*  --   < > 1.47* 1.24 1.62*  1.69*  < > = values in this interval not displayed.  Estimated Creatinine Clearance: 54 mL/min (by C-G formula based on Cr of 1.69).   Medications:  Heparin on hold  Assessment: 64 YOM on Xarelto PTA for hx Afib - held in the setting of AKI requiring CRRT and transitioned to heparin. The patient is noted to have both a PICC line and a RIJ HD catheter.  APTT remains elevated this am but HL is now therapeutic at 0.30 likely drawn from new a-line (still receiving with CRRT). Repeat aPTT remains elevated but has trended down, HL is therapeutic and has trended up to 0.48 (drawn from a-line in left arm) still off systemic heparin. CBC remains low but stable with no significant s/s bleeding reported.   Goal of Therapy:  Heparin level 0.3-0.7 units/ml aPTT 66-102 seconds Monitor platelets by anticoagulation protocol: Yes   Plan:  - Continue to hold heparin gtt - F/u AM aPTT/HL  - Monitor for bleeding  Jadasia Haws K. Velva Harman, PharmD, [place] Clinical Pharmacist Pager: [phone] Phone: [phone] 09/05/2014 11:14  AM

## 2014-09-05 NOTE — Progress Notes (Signed)
[facility] PROGRESS NOTE  MILIK GILREATH BPZ:[id number] DOB: 13-Sep-1950 DOA: 08/25/2014 PCP: [name], MD   Assessment/Plan:  1. Severe nonischemic cardiomyopathy with chronic systolic heart failure with an EF of 25%. History of V. tach. Patient has AICD, on home milrinone drip. Experienced hypotension requiring norepinephrine and now on dobutamine. -Patient was started on CVVHD on 08/31/2014 -Cardiology managing HF meds -He is tolerating CVVHD; but has remained oliguric and most likely will need permanent HD -Patient having urinary output in the 70-80 mL range daily.  2.  Acute on chronic renal failure -Labs showing ongoing downward trend in his creatinine -Xarelto was discontinued and started on IV heparin -Nephrology was consulted, feeling renal failure likely represents postoperative ATN/hypoperfusion from hypotension -CVVH was started on 08/31/2014 -A.m. labs on 09/04/2014 showing creatinine of 1.6 -still oliguric -Nephrology managing  -might ended on permanent HD  3. Constipation -Patient having several large bowel movements overnight  4.  Hyponatremia -I suspect secondary to volume overload and renal failure -Improved after starting CRT, sodium stable now  5. Mechanical trip and fall with left femoral intertrochanteric fracture.  -status post intramedullary nail to left hip on 7/19 -WBAT -PT/OT evaluation -Plan for discharge to SNF when medically stable  6. Paroxysmal atrial fibrillation.  -Xarelto discontinued due to worsening kidney function, now on IV heparin and with plans for coumadin eventually -On Amio  7. Obstructive sleep apnea.  -Patient refusing BiPAP  8. DM type II. recent A1c of 7.7 -continue SSI -lantus changed to 5 units only, he had hypoglycemia  Code Status: Full Family Communication: wife at bedside Disposition Plan: Patient will remain on CCHF for now     Consultants:  Ortho (Dr. Percell Miller)  HF team    Nephrology  Procedures:  S/P intramedullary nail on left hip on 7/18  CVVH   Antibiotics:  None   HPI/Subjective: Patient with decrease appetite and episode of hypoglycemia and hypotension on 7/27 evening. No fever and after medications adjusted, plus D50 amp given episode resolved. Now on dobutamine drip.   Objective: Filed Vitals:   09/05/14 0737  BP:   Pulse:   Temp: 97.1 F (36.2 C)  Resp:     Intake/Output Summary (Last 24 hours) at 09/05/14 0753 Last data filed at 09/05/14 0700  Gross per 24 hour  Intake 1175.1 ml  Output   2156 ml  Net -980.9 ml   Filed Weights   09/03/14 0500 09/04/14 0800 09/05/14 0500  Weight: 104.4 kg (230 lb 2.6 oz) 102.4 kg (225 lb 12 oz) 101.1 kg (222 lb 14.2 oz)    Exam:   General:  He is awake and alert, oriented X 3, follows commands, chronically ill-appearing; O2 supplementation on board through [place].   Cardiovascular:no rubs, no gallops, RRR  Respiratory: Normal respiratory effort, lungs are clear to auscultation bilaterally  Abdomen: soft, NT, ND, positive Bowel sounds  Musculoskeletal: Surgical incision site appears clean, no evidence of infection. Still with pedal edema and complaining of pain on his left knee cap and left proximal tight    Data Reviewed: Basic Metabolic Panel:  Recent Labs Lab 09/01/14 0425  09/02/14 0400 09/02/14 1715 09/03/14 0430 09/03/14 1628 09/04/14 0413 09/04/14 1652 09/05/14 0415  NA  --   < > 131* 134* 135  133* 133* 131* 133* 132*  132*  K  --   < > 4.4 3.9 3.6  3.5 3.5 3.3* 3.9 4.0  4.0  CL  --   < > 98* 101 100*  98* 100* 99* 97* 99*  98*  CO2  --   < > 26 27 27  26 26 27 26 27  26   GLUCOSE  --   < > 174* 123* 116*  156* 111* 119* 152* 120*  120*  BUN  --   < > 19 13 9  9 7 6  <5* <5*  5*  CREATININE  --   < > 2.12* 1.81* 1.70*  1.58* 1.59* 1.47* 1.24 1.62*  1.69*  CALCIUM  --   < > 8.0* 8.2* 8.2*  8.2* 8.1* 8.1* 7.7* 8.0*  8.0*  MG 2.3  --  2.4  --  2.5*  --   2.3  --  2.2  PHOS  --   < > 2.4* 3.4 2.8 2.4*  --  2.2* 2.3*  < > = values in this interval not displayed. CBC:  Recent Labs Lab 08/30/14 1744  09/01/14 0425 09/02/14 0400 09/03/14 0430 09/04/14 0413 09/05/14 0415  WBC 7.5  < > 6.0 7.5 7.4 8.9 9.5  NEUTROABS 5.4  --   --   --   --   --   --   HGB 9.2*  < > 9.2* 9.3* 9.7* 10.0* 10.2*  HCT 28.5*  < > 28.1* 29.1* 30.9* 31.7* 31.4*  MCV 94.1  < > 94.6 98.6 99.7 98.8 98.4  PLT 258  < > 258 258 252 281 254  < > = values in this interval not displayed. BNP (last 3 results)  Recent Labs  05/22/14 1029 05/27/14 1126 06/19/14 1030  BNP 935.2* 1627.8* 738.9*    ProBNP (last 3 results)  Recent Labs  10/09/13 0926 11/14/13 0926 01/14/14 1112  PROBNP 1561.0* 1261.0* 5769.0*   CBG:  Recent Labs Lab 09/04/14 1254 09/04/14 1427 09/04/14 1647 09/04/14 2006 09/04/14 2213  GLUCAP 79 125* 279* 199* 185*    Recent Results (from the past 240 hour(s))  Clostridium Difficile by PCR (not at [facility])     Status: None   Collection Time: 09/02/14 10:27 PM  Result Value Ref Range Status   C difficile by pcr NEGATIVE NEGATIVE Final     Studies: Dg Chest Port 1 View  09/04/2014   CLINICAL DATA:  Shortness of breath/hypoxia  EXAM: PORTABLE CHEST - 1 VIEW  COMPARISON:  August 31, 2014  FINDINGS: Central catheter tip is in the superior vena cava. No pneumothorax. Pacemaker leads are attached to the right atrium and right ventricle. Heart is prominent but stable. The pulmonary vascularity is normal. No edema or consolidation. No adenopathy.  IMPRESSION: No edema or consolidation. No change in cardiac silhouette. No pneumothorax.   Electronically Signed   By: Lowella Grip III M.D.   On: 09/04/2014 14:21    Scheduled Meds: . sodium chloride   Intravenous Once  . sodium chloride  10 mL/hr Intravenous Once  . ALPRAZolam  0.5 mg Oral QHS  . amiodarone  400 mg Oral BID  . atorvastatin  40 mg Oral q1800  . chlorhexidine  15 mL Mouth/Throat  BID  . DULoxetine  30 mg Oral Daily  . feeding supplement (NEPRO CARB STEADY)  237 mL Oral BID BM  . insulin aspart  0-9 Units Subcutaneous TID AC & HS  . insulin glargine  5 Units Subcutaneous QHS  . levothyroxine  75 mcg Oral QAC breakfast  . potassium chloride  40 mEq Oral Once  . sildenafil  80 mg Oral TID  . sodium chloride irrigation  200 mL Irrigation Q2H while  awake  . sodium phosphate  Dextrose 5% IVPB  20 mmol Intravenous Once   Continuous Infusions: . sodium chloride 10 mL/hr at 08/29/14 0543  . DOBUTamine 2.5 mcg/kg/min (09/04/14 1800)  . heparin 10,000 units/ 20 mL infusion syringe 1,700 Units/hr (09/05/14 0734)  . norepinephrine (LEVOPHED) Adult infusion Stopped (09/05/14 0615)  . dialysis replacement fluid (prismasate) 700 mL/hr at 09/05/14 0124  . dialysis replacement fluid (prismasate) 700 mL/hr at 09/05/14 0124  . dialysate (PRISMASATE) 1,500 mL/hr at 09/05/14 0620  . sodium chloride irrigation      Principal Problem:   Fracture, intertrochanteric, left femur Active Problems:   Essential hypertension, benign   Persistent atrial fibrillation   Automatic implantable cardioverter-defibrillator in situ   DM2 (diabetes mellitus, type 2)   OSA (obstructive sleep apnea), intolerant to CPAP/BIPAP   Ventricular tachycardia   Chronic systolic CHF (congestive heart failure)   Hip fracture   Left femoral shaft fracture   Acute on chronic renal failure   Hyponatremia   Acute on chronic systolic CHF (congestive heart failure)   Acute renal failure superimposed on stage 4 chronic kidney disease   Time spent: 30 minutes   Barton Dubois  [facility] Pager [phone]. If 7PM-7AM, please contact night-coverage at [email or web address], password [email or web address] 09/05/2014, 7:53 AM  LOS: 11 days

## 2014-09-05 NOTE — Progress Notes (Signed)
CSW met with wife and patient at bedside. Patient reports he is feeling better today and wife responded "that makes me feel better too". Wife reports she was able to get a good night sleep last night and made an appointment for tomorrow morning with her counselor/MD to address anxiety. Wife appears to be in better spirits today. CSW available as needed for support. CSW will follow thru outpatient HF clinic as needed. Raquel Sarna, [facility]

## 2014-09-05 NOTE — Progress Notes (Signed)
Patient ID: Larry [name], male   DOB: 1950/09/22, 64 y.o.   MRN: [id number] Advanced Heart Failure Rounding Note   Subjective:    Mr. Klugh is a 64 y/o man with history of chronic systolic CHF (nonischemic cardiomyopathy) s/p 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 He is on chronic milrinone 0.5 mcg.  Prior to admit he was being considered for LVAD.   Dischaged 7/16 after dental procedures. Also had RHC with elevate pulmonary pressures. Revatio was increased to 80 mg tid during that stay.  Admitted with fall---> broken L  hip . 7/18 had IM Nail L femur.  7/23 started CVVHD.   Hypotensive 7/28, stopped milrinone and started norepinephrine.  Pressure recovered, now off norepinephrine and on dobutamine 2.5 mcg/kg/min.  BP stable today.  UF at 70 cc/hr on CVVH.  He is awake and alert. CVP 11, co-ox 81%.  Still very poor UOP.    Objective:   Weight Range:  Vital Signs:   Temp:  [91 F (32.8 C)-98.2 F (36.8 C)] 97.1 F (36.2 C) (07/28 0737) Pulse Rate:  [49-107] 68 (07/28 0700) Resp:  [11-30] 12 (07/28 0700) BP: (71-137)/(32-102) 109/49 mmHg (07/27 2245) SpO2:  [86 %-100 %] 100 % (07/28 0700) Arterial Line BP: (97-134)/(35-59) 123/42 mmHg (07/28 0700) Weight:  [222 lb 14.2 oz (101.1 kg)-225 lb 12 oz (102.4 kg)] 222 lb 14.2 oz (101.1 kg) (07/28 0500) Last BM Date: 09/04/14  Weight change: Filed Weights   09/03/14 0500 09/04/14 0800 09/05/14 0500  Weight: 230 lb 2.6 oz (104.4 kg) 225 lb 12 oz (102.4 kg) 222 lb 14.2 oz (101.1 kg)    Intake/Output:   Intake/Output Summary (Last 24 hours) at 09/05/14 0746 Last data filed at 09/05/14 0700  Gross per 24 hour  Intake 1175.1 ml  Output   2156 ml  Net -980.9 ml     Physical Exam: General:  NAD  HEENT: normal Neck: supple. JVP 10 cm. RIJ trialysis catheter.  Carotids 2+ bilat; no bruits. No lymphadenopathy or thryomegaly appreciated. Cor: PMI  nondisplaced. Regular rate & rhythm. No rubs, gallops or murmurs. Lungs: Diminished both bases with mild crackles Abdomen: soft, nontender, nondistended. No hepatosplenomegaly. No bruits or masses. Good bowel sounds. Extremities: no cyanosis, clubbing, rash. RUE PICC dual lumen . R Thigh lateral aspect dressing intact x2. 1+ ankle edema. Neuro: Awake, alert.   Telemetry: NSR with PVCs  Labs: Basic Metabolic Panel:  Recent Labs Lab 09/01/14 0425  09/02/14 0400 09/02/14 1715 09/03/14 0430 09/03/14 1628 09/04/14 0413 09/04/14 1652 09/05/14 0415  NA  --   < > 131* 134* 135  133* 133* 131* 133* 132*  132*  K  --   < > 4.4 3.9 3.6  3.5 3.5 3.3* 3.9 4.0  4.0  CL  --   < > 98* 101 100*  98* 100* 99* 97* 99*  98*  CO2  --   < > 26 27 27  26 26 27 26 27  26   GLUCOSE  --   < > 174* 123* 116*  156* 111* 119* 152* 120*  120*  BUN  --   < > 19 13 9  9 7 6  <5* <5*  5*  CREATININE  --   < > 2.12* 1.81* 1.70*  1.58* 1.59* 1.47* 1.24 1.62*  1.69*  CALCIUM  --   < > 8.0* 8.2* 8.2*  8.2* 8.1* 8.1* 7.7* 8.0*  8.0*  MG 2.3  --  2.4  --  2.5*  --  2.3  --  2.2  PHOS  --   < > 2.4* 3.4 2.8 2.4*  --  2.2* 2.3*  < > = values in this interval not displayed.  Liver Function Tests:  Recent Labs Lab 09/03/14 0430 09/03/14 1628 09/04/14 0413 09/04/14 1652 09/05/14 0415  AST  --   --  26  --   --   ALT  --   --  10*  --   --   ALKPHOS  --   --  68  --   --   BILITOT  --   --  1.1  --   --   PROT  --   --  6.7  --   --   ALBUMIN 2.8* 2.7* 2.7* 3.0* 2.7*   No results for input(s): LIPASE, AMYLASE in the last 168 hours. No results for input(s): AMMONIA in the last 168 hours.  CBC:  Recent Labs Lab 08/30/14 1744  09/01/14 0425 09/02/14 0400 09/03/14 0430 09/04/14 0413 09/05/14 0415  WBC 7.5  < > 6.0 7.5 7.4 8.9 9.5  NEUTROABS 5.4  --   --   --   --   --   --   HGB 9.2*  < > 9.2* 9.3* 9.7* 10.0* 10.2*  HCT 28.5*  < > 28.1* 29.1* 30.9* 31.7* 31.4*  MCV 94.1  < > 94.6 98.6  99.7 98.8 98.4  PLT 258  < > 258 258 252 281 254  < > = values in this interval not displayed.  Cardiac Enzymes: No results for input(s): CKTOTAL, CKMB, CKMBINDEX, TROPONINI in the last 168 hours.  BNP: BNP (last 3 results)  Recent Labs  05/22/14 1029 05/27/14 1126 06/19/14 1030  BNP 935.2* 1627.8* 738.9*    ProBNP (last 3 results)  Recent Labs  10/09/13 0926 11/14/13 0926 01/14/14 1112  PROBNP 1561.0* 1261.0* 5769.0*      Other results:  Imaging: Dg Chest Port 1 View  09/04/2014   CLINICAL DATA:  Shortness of breath/hypoxia  EXAM: PORTABLE CHEST - 1 VIEW  COMPARISON:  August 31, 2014  FINDINGS: Central catheter tip is in the superior vena cava. No pneumothorax. Pacemaker leads are attached to the right atrium and right ventricle. Heart is prominent but stable. The pulmonary vascularity is normal. No edema or consolidation. No adenopathy.  IMPRESSION: No edema or consolidation. No change in cardiac silhouette. No pneumothorax.   Electronically Signed   By: Lowella Grip III M.D.   On: 09/04/2014 14:21     Medications:     Scheduled Medications: . sodium chloride   Intravenous Once  . sodium chloride  10 mL/hr Intravenous Once  . ALPRAZolam  0.5 mg Oral QHS  . amiodarone  400 mg Oral BID  . atorvastatin  40 mg Oral q1800  . chlorhexidine  15 mL Mouth/Throat BID  . DULoxetine  30 mg Oral Daily  . insulin aspart  0-9 Units Subcutaneous TID AC & HS  . levothyroxine  75 mcg Oral QAC breakfast  . potassium chloride  40 mEq Oral Once  . sildenafil  80 mg Oral TID  . sodium chloride irrigation  200 mL Irrigation Q2H while awake  . sodium phosphate  Dextrose 5% IVPB  20 mmol Intravenous Once    Infusions: . sodium chloride 10 mL/hr at 08/29/14 0543  . DOBUTamine 2.5 mcg/kg/min (09/04/14 1800)  . heparin 10,000 units/ 20 mL infusion syringe  1,700 Units/hr (09/05/14 0734)  . norepinephrine (LEVOPHED) Adult infusion Stopped (09/05/14 0615)  . dialysis replacement  fluid (prismasate) 700 mL/hr at 09/05/14 0124  . dialysis replacement fluid (prismasate) 700 mL/hr at 09/05/14 0124  . dialysate (PRISMASATE) 1,500 mL/hr at 09/05/14 0620  . sodium chloride irrigation      PRN Medications: Place/Maintain arterial line **AND** sodium chloride, albuterol, ALPRAZolam **AND** ALPRAZolam, heparin, heparin, morphine injection, ondansetron (ZOFRAN) IV, oxyCODONE-acetaminophen **AND** oxyCODONE, sodium chloride, sodium chloride, zolpidem   Assessment:  1. Left Hip Fracture s/p IM nail on 08/26/14 2. Chronic systolic CHF- Nonischemic cardiomyopathy, on home milrinone 0.5 mcg/kg/min, decreased to 0.25 here with NSVT. Became hypotensive, now on dobutamine 2.5.  3. Acute on chronic renal failure - suspect post-op ATN  4.  Paroxysmal atrial fibrillation requiring DCCV 02/2014, 04/2014- on chronic amio and xarelto. CHADS VASc Score- 5.  Remains in NSR.  5. Diabetes mellitus type 2 6. Depression/anxiety 7. OSA, complex- Needs Bipap at night but had difficulty after dental procedure.  8 Pulmonary HTN - Mixed pulmonary venous hypertension and PAH- on Revatio 80 mg tid 9. Dental extractions 08/2014 by Dr Enrique Sack 10. Obesity Body mass index is 32.39 kg/(m^2). 11. Probable chronic respiratory failure - requiring home O2 with ambulation as of this admission 12. Hyponatremia   13. NSVT 14. Delirium  Plan/Discussion:    Now on CVVHD. Suspect post-op ATN, oliguric (70 cc/24 hrs).  CVP remains mildly elevated at 11. Fluid removal at 70 cc/hr, Awaiting renal recovery but still very poor UOP.  Unfortunately, suspect he is going to have to go on HD.  BP now stable, can transition over to intermittent hemodialysis when renal thinks appropriate.  Switched over now to dobutamine, think this will be safer than milrinone with intermittent HD.  Co-ox 81%.    Reloading amiodarone with ventricular ectopy at 400 mg bid.   Sinus rhythm with PVCs, no atrial fibrillation. Long PR interval  noted on EKG. Continue amiodarone. Off Metoprolol XL with soft BP. Off Xarelto due to renal failure. Now on heparin gtt => eventually transition to warfarin. Pharmacy following.   Length of Stay: [place] Seth Friedlander,MD 7:46 AM Advanced Heart Failure Team Pager [phone] (M-F; 7a - 4p)  Please contact [facility] for night-coverage after hours (4p -7a ) and weekends on [email or web address]

## 2014-09-05 NOTE — Progress Notes (Signed)
Patient refused BiPAP for tonight.  

## 2014-09-06 DIAGNOSIS — G4700 Insomnia, unspecified: Secondary | ICD-10-CM

## 2014-09-06 LAB — CARBOXYHEMOGLOBIN
CARBOXYHEMOGLOBIN: 1.6 % — AB (ref 0.5–1.5)
METHEMOGLOBIN: 1.1 % (ref 0.0–1.5)
O2 Saturation: 71.9 %
Total hemoglobin: 10.2 g/dL — ABNORMAL LOW (ref 13.5–18.0)

## 2014-09-06 LAB — GLUCOSE, CAPILLARY
GLUCOSE-CAPILLARY: 143 mg/dL — AB (ref 65–99)
Glucose-Capillary: 118 mg/dL — ABNORMAL HIGH (ref 65–99)
Glucose-Capillary: 120 mg/dL — ABNORMAL HIGH (ref 65–99)

## 2014-09-06 LAB — RENAL FUNCTION PANEL
ANION GAP: 4 — AB (ref 5–15)
ANION GAP: 7 (ref 5–15)
Albumin: 2.7 g/dL — ABNORMAL LOW (ref 3.5–5.0)
Albumin: 2.7 g/dL — ABNORMAL LOW (ref 3.5–5.0)
BUN: 6 mg/dL (ref 6–20)
BUN: 6 mg/dL (ref 6–20)
CALCIUM: 8.4 mg/dL — AB (ref 8.9–10.3)
CHLORIDE: 101 mmol/L (ref 101–111)
CO2: 27 mmol/L (ref 22–32)
CO2: 27 mmol/L (ref 22–32)
Calcium: 8.3 mg/dL — ABNORMAL LOW (ref 8.9–10.3)
Chloride: 101 mmol/L (ref 101–111)
Creatinine, Ser: 1.69 mg/dL — ABNORMAL HIGH (ref 0.61–1.24)
Creatinine, Ser: 1.69 mg/dL — ABNORMAL HIGH (ref 0.61–1.24)
GFR calc Af Amer: 48 mL/min — ABNORMAL LOW (ref >60)
GFR, EST AFRICAN AMERICAN: 48 mL/min — AB (ref >60)
GFR, EST NON AFRICAN AMERICAN: 42 mL/min — AB (ref >60)
GFR, EST NON AFRICAN AMERICAN: 42 mL/min — AB (ref >60)
GLUCOSE: 140 mg/dL — AB (ref 65–99)
Glucose, Bld: 124 mg/dL — ABNORMAL HIGH (ref 65–99)
PHOSPHORUS: 2.9 mg/dL (ref 2.5–4.6)
PHOSPHORUS: 2.5 mg/dL (ref 2.5–4.6)
POTASSIUM: 4.3 mmol/L (ref 3.5–5.1)
Potassium: 4.3 mmol/L (ref 3.5–5.1)
Sodium: 132 mmol/L — ABNORMAL LOW (ref 135–145)
Sodium: 135 mmol/L (ref 135–145)

## 2014-09-06 LAB — POCT ACTIVATED CLOTTING TIME
ACTIVATED CLOTTING TIME: 196 s
ACTIVATED CLOTTING TIME: 196 s
ACTIVATED CLOTTING TIME: 202 s
ACTIVATED CLOTTING TIME: 202 s
ACTIVATED CLOTTING TIME: 208 s
ACTIVATED CLOTTING TIME: 251 s
Activated Clotting Time: 208 seconds
Activated Clotting Time: 202 seconds

## 2014-09-06 LAB — CBC
HCT: 32 % — ABNORMAL LOW (ref 39.0–52.0)
Hemoglobin: 10 g/dL — ABNORMAL LOW (ref 13.0–17.0)
MCH: 31.1 pg (ref 26.0–34.0)
MCHC: 31.3 g/dL (ref 30.0–36.0)
MCV: 99.4 fL (ref 78.0–100.0)
PLATELETS: 235 10*3/uL (ref 150–400)
RBC: 3.22 MIL/uL — ABNORMAL LOW (ref 4.22–5.81)
RDW: 16.1 % — AB (ref 11.5–15.5)
WBC: 7.1 10*3/uL (ref 4.0–10.5)

## 2014-09-06 LAB — BASIC METABOLIC PANEL
Anion gap: 7 (ref 5–15)
BUN: 6 mg/dL (ref 6–20)
CALCIUM: 8.3 mg/dL — AB (ref 8.9–10.3)
CO2: 26 mmol/L (ref 22–32)
Chloride: 99 mmol/L — ABNORMAL LOW (ref 101–111)
Creatinine, Ser: 1.8 mg/dL — ABNORMAL HIGH (ref 0.61–1.24)
GFR, EST AFRICAN AMERICAN: 45 mL/min — AB (ref >60)
GFR, EST NON AFRICAN AMERICAN: 39 mL/min — AB (ref >60)
GLUCOSE: 125 mg/dL — AB (ref 65–99)
Potassium: 4.3 mmol/L (ref 3.5–5.1)
Sodium: 132 mmol/L — ABNORMAL LOW (ref 135–145)

## 2014-09-06 LAB — MAGNESIUM: MAGNESIUM: 2.4 mg/dL (ref 1.7–2.4)

## 2014-09-06 LAB — HEPARIN LEVEL (UNFRACTIONATED): Heparin Unfractionated: 0.59 IU/mL (ref 0.30–0.70)

## 2014-09-06 LAB — APTT: aPTT: 138 seconds — ABNORMAL HIGH (ref 24–37)

## 2014-09-06 MED ORDER — TEMAZEPAM 7.5 MG PO CAPS
7.5000 mg | ORAL_CAPSULE | Freq: Every evening | ORAL | Status: DC | PRN
  Administered 2014-09-06 – 2014-10-10 (×34): 7.5 mg via ORAL
  Filled 2014-09-06 (×34): qty 1

## 2014-09-06 NOTE — Progress Notes (Signed)
Subjective: Larry [name] is a 64 yo male with PMHx of CKD stage III who was admitted with a left femoral intertrochanteric fracture and subsequently developed acute on chronic renal failure likely secondary to hypoperfusion. Patient was seen and examined this morning. Patient is awake and alert, he feels well overall despite hip and chronic back pain. He denies shortness of breath.  Objective: Filed Vitals:   09/06/14 0800 09/06/14 0900 09/06/14 1000 09/06/14 1200  BP:      Pulse: 62 64 64 64  Temp:    97.6 F (36.4 C)  TempSrc:    Oral  Resp: 13 16 26 12   Height:      Weight:      SpO2: 100% 100% 92% 99%   General: Vital signs reviewed. Patient is in no acute distress and cooperative with exam.  Cardiovascular: RRR, S1 normal, S2 normal, no MRG Pulmonary/Chest: CTA b/l  Abdominal: Soft, non-tender, non-distended, BS +  Extremities: Trace pitting edema Skin: Warm, dry and intact. No rashes or erythema.  Intake/Output from previous day: 07/28 0701 - 07/29 0700 In: 1247.2 [P.O.:565; I.V.:232.2; IV Piggyback:430] Out: 2967 [Urine:47; Stool:20] Intake/Output this shift: Total I/O In: 399 [P.O.:350; I.V.:49] Out: 842 [Urine:30; Other:812]  Lab Results:  Recent Labs  09/05/14 0415 09/06/14 0335  WBC 9.5 7.1  HGB 10.2* 10.0*  HCT 31.4* 32.0*  PLT 254 235   BMET:   Recent Labs  09/05/14 1450 09/06/14 0335  NA 133* 132*  132*  K 4.1 4.3  4.3  CL 98* 99*  101  CO2 27 26  27   GLUCOSE 127* 125*  124*  BUN <5* 6  6  CREATININE 1.70* 1.80*  1.69*  CALCIUM 8.2* 8.3*  8.3*   CBG (last 3)   Recent Labs  09/05/14 2138 09/06/14 0728 09/06/14 1126  GLUCAP 138* 118* 143*   Studies/Results: Dg Chest Port 1 View  09/04/2014   CLINICAL DATA:  Shortness of breath/hypoxia  EXAM: PORTABLE CHEST - 1 VIEW  COMPARISON:  August 31, 2014  FINDINGS: Central catheter tip is in the superior vena cava. No pneumothorax. Pacemaker leads are attached to the right atrium and right  ventricle. Heart is prominent but stable. The pulmonary vascularity is normal. No edema or consolidation. No adenopathy.  IMPRESSION: No edema or consolidation. No change in cardiac silhouette. No pneumothorax.   Electronically Signed   By: Lowella Grip III M.D.   On: 09/04/2014 14:21   I have reviewed the patient's current medications. Prior to Admission:  Prescriptions prior to admission  Medication Sig Dispense Refill Last Dose  . ALPRAZolam (XANAX) 0.5 MG tablet Take 0.5 mg by mouth See admin instructions. Take 1 tablet (0.5 mg) every night, may take an additional tablet two more times during the day as needed for anxiety   08/24/2014 at Unknown time  . amiodarone (PACERONE) 200 MG tablet Take 1 tablet (200 mg total) by mouth daily. 30 tablet 3 prior to admission 08/21/14  . atorvastatin (LIPITOR) 40 MG tablet TAKE 1 TABLET DAILY 90 tablet 3 prior to admission 08/21/14  . bisacodyl (DULCOLAX) 5 MG EC tablet Take 5 mg by mouth at bedtime.   prior to admission 08/21/14  . chlorhexidine (PERIDEX) 0.12 % solution Perform mouth rinses twice daily after breakfast and at bedtime. (Patient taking differently: Use as directed in the mouth or throat 2 (two) times daily. Perform mouth rinses twice daily after breakfast and at bedtime (swish and spit)) 120 mL 0 08/25/2014 at am  .  digoxin (LANOXIN) 0.125 MG tablet Take 0.5 tablets (0.0625 mg total) by mouth daily. 90 tablet 3 prior to admission 08/21/14  . DULoxetine (CYMBALTA) 30 MG capsule Take 60 mg by mouth 2 (two) times daily.    prior to admission 08/21/14  . GLUCAGON EMERGENCY 1 MG injection Inject 1 mg as directed once as needed (hypotension).    not yet needed  . hydrALAZINE (APRESOLINE) 100 MG tablet Take 1 tablet (100 mg total) by mouth 3 (three) times daily. 90 tablet 1 prior to admission 08/21/14  . insulin glargine (LANTUS) 100 unit/mL SOPN Inject 30 Units into the skin at bedtime.   prior to admission 08/21/14  . insulin lispro (HUMALOG KWIKPEN)  100 UNIT/ML KiwkPen Inject 30 Units into the skin 3 (three) times daily before meals.   prior to admission 08/21/14  . levothyroxine (SYNTHROID, LEVOTHROID) 75 MCG tablet TAKE 1 TABLET ON AN EMPTY STOMACH 30 MINUTES BEFORE BREAKFAST  5 prior to admission 08/21/14  . Melatonin 10 MG TABS Take 10 mg by mouth at bedtime as needed (sleep).    week ago  . metoprolol succinate (TOPROL-XL) 25 MG 24 hr tablet Take 1 tablet (25 mg total) by mouth 2 (two) times daily before a meal. 60 tablet 6 prior to admission 08/21/14 at unknown time  . milrinone (PRIMACOR) 20 MG/100ML SOLN infusion Inject 51.2 mcg/min into the vein continuous. (0.86mcg/kg/min)   08/25/2014 at continuous  . Multiple Vitamin (MULITIVITAMIN WITH MINERALS) TABS Take 1 tablet by mouth daily.   prior to admission 08/21/14  . oxyCODONE-acetaminophen (PERCOCET) 10-325 MG per tablet Take 1 tablet by mouth every 4 (four) hours as needed for pain.   0 08/25/2014 at 600  . Potassium Chloride ER 20 MEQ TBCR Take 20 mEq by mouth 2 (two) times daily. 60 tablet 0 prior to admission 08/21/14  . rivaroxaban (XARELTO) 15 MG TABS tablet Take 1 tablet (15 mg total) by mouth daily with supper. 30 tablet 3 prior to admission 08/21/14  . sildenafil (REVATIO) 20 MG tablet Take 4 tablets (80 mg total) by mouth 3 (three) times daily. 360 tablet 1 prior to admission 08/21/14  . spironolactone (ALDACTONE) 25 MG tablet TAKE 1 TABLET DAILY 90 tablet 2 prior to admission 08/21/14  . torsemide (DEMADEX) 20 MG tablet Take 3 tablets (60 mg total) by mouth 2 (two) times daily. 180 tablet 1 prior to admission 08/21/14  . zolpidem (AMBIEN) 5 MG tablet Take 1 tablet (5 mg total) by mouth at bedtime as needed for sleep. 30 tablet 0 08/24/2014 at Unknown time  . oxyCODONE-acetaminophen (PERCOCET/ROXICET) 5-325 MG per tablet Take 1-2 tablets by mouth every 4-6 hours as needed for moderate-severe pain. (Patient not taking: Reported on 08/25/2014) 40 tablet 0 Not Taking at Unknown time    Scheduled: . sodium chloride   Intravenous Once  . sodium chloride  10 mL/hr Intravenous Once  . ALPRAZolam  0.5 mg Oral QHS  . amiodarone  400 mg Oral BID  . atorvastatin  40 mg Oral q1800  . chlorhexidine  15 mL Mouth/Throat BID  . DULoxetine  30 mg Oral Daily  . feeding supplement (NEPRO CARB STEADY)  237 mL Oral BID BM  . insulin aspart  0-9 Units Subcutaneous TID AC & HS  . insulin glargine  5 Units Subcutaneous QHS  . levothyroxine  75 mcg Oral QAC breakfast  . potassium chloride  40 mEq Oral Once  . sildenafil  80 mg Oral TID  . sodium chloride irrigation  200 mL Irrigation Q2H while awake   Continuous: . sodium chloride 10 mL/hr at 08/29/14 0543  . DOBUTamine 2.5 mcg/kg/min (09/05/14 0800)  . heparin 10,000 units/ 20 mL infusion syringe 1,600 Units/hr (09/06/14 1200)  . norepinephrine (LEVOPHED) Adult infusion Stopped (09/05/14 0615)  . dialysis replacement fluid (prismasate) 700 mL/hr at 09/06/14 0745  . dialysis replacement fluid (prismasate) 700 mL/hr at 09/06/14 0745  . dialysate (PRISMASATE) 1,500 mL/hr at 09/06/14 1000  . sodium chloride irrigation     MPN:TIRWE/[id number] arterial line **AND** sodium chloride, albuterol, ALPRAZolam **AND** ALPRAZolam, heparin, heparin, morphine injection, ondansetron (ZOFRAN) IV, oxyCODONE-acetaminophen **AND** oxyCODONE, sodium chloride, sodium chloride, zolpidem  Assessment/Plan:   Acute on Chronic Kidney Disease: Likely secondary to ATN with hypoperfusion, low FENa. UOP continues to be an issues as patient remains oliguric with -66 UOP yesterday. Creatinine has trended down in the setting of CRRT, 1.69 this morning. We will continue CRRT today. -Continue CRRT -Consider intermittent HD -Repeat renal function panel tomorrow morning -Fluid restrict -Strict I/Os  Hypophosphatemia: Improved to 2.9 this morning. -Repeat renal function panel tomorrow am  Hypervolemic Hyponatremia: Likely secondary to acute on chronic kidney  failure and CHF. Stable on CRRT. -Repeat RFP tomorrow morning  Normocytic Anemia: Hgb stable. Likely secondary to chronic kidney disease.  -Monitor H/H intermittently   LOS: 12 days   Osa Craver, DO PGY-2 Internal Medicine Resident Pager # [phone] 09/06/2014 12:16 PM

## 2014-09-06 NOTE — Progress Notes (Signed)
Occupational Therapy Treatment Patient Details Name: Larry [name] MRN: [id number] DOB: [birth year] Today's Date: 09/06/2014    History of present illness ROMELLO HOEHN is a 64 y.o. male who complains of left hip pain after sustaining a mechanical fall at home. Pt with left intertrochanteric fx s/p IM nail. PMHx NICM, CAD, DDD, back pain, CVA, obesity   OT comments  Pt alert with improved attention and cognition.  Focus of session on UB exercise with 1 lb weight and grooming.  Activity remains bed level due to CRRT.  Pt reports having performed UB exercise with his wife's assist since last OT visit.  Instructed pt to perform 3 x per day.  Follow Up Recommendations  SNF;Supervision/Assistance - 24 hour    Equipment Recommendations       Recommendations for Other Services      Precautions / Restrictions Restrictions Weight Bearing Restrictions: Yes LLE Weight Bearing: Weight bearing as tolerated       Mobility Bed Mobility                  Transfers                      Balance                                   ADL Overall ADL's : Needs assistance/impaired Eating/Feeding: Set up;Bed level   Grooming: Wash/dry hands;Wash/dry face;Bed level;Set up                                        Vision                     Perception     Praxis      Cognition   Behavior During Therapy: Flat affect Overall Cognitive Status: Impaired/Different from baseline   Orientation Level: Disoriented to;Time Current Attention Level: Alternating Memory: Decreased short-term memory          General Comments: appropriate conversation, at times joking with friend. able to continue UE exercises while conversing.    Extremity/Trunk Assessment               Exercises General Exercises - Upper Extremity Shoulder Flexion: Strengthening;Both;20 reps;Seated;Bar weights/barbell Bar Weights/Barbell (Shoulder Flexion): 1  lb Shoulder ABduction: Strengthening;Both;20 reps;Seated;Bar weights/barbell Bar Weights/Barbell (Shoulder Abduction): 1 lb Elbow Flexion: Strengthening;Both;20 reps;Seated;Bar weights/barbell Bar Weights/Barbell (Elbow Flexion): 1 lb Elbow Extension: Strengthening;Both;20 reps;Seated;Bar weights/barbell Bar Weights/Barbell (Elbow Extension): 1 lb   Shoulder Instructions       General Comments      Pertinent Vitals/ Pain       Pain Assessment: Faces Faces Pain Scale: Hurts little more Pain Location: back, L hip Pain Descriptors / Indicators: Aching Pain Intervention(s): Limited activity within patient's tolerance;Repositioned;Premedicated before session  Home Living                                          Prior Functioning/Environment              Frequency Min 2X/week     Progress Toward Goals  OT Goals(current goals can now be found in the care plan section)  Progress towards OT goals: Progressing toward goals  Plan Discharge plan remains appropriate    Co-evaluation                 End of Session Equipment Utilized During Treatment: Oxygen   Activity Tolerance Patient tolerated treatment well   Patient Left in bed;with call bell/phone within reach;with family/visitor present;with nursing/sitter in room   Nurse Communication          Time: 4356-8616 OT Time Calculation (min): 22 min  Charges: OT General Charges $OT Visit: 1 Procedure OT Treatments $Therapeutic Exercise: 8-22 mins  [name] 09/06/2014, 10:11 AM  [phone]

## 2014-09-06 NOTE — Progress Notes (Signed)
ANTICOAGULATION CONSULT NOTE - Follow Up Consult  Pharmacy Consult for Heparin (while Xarelto on hold) Indication: atrial fibrillation  Allergies  Allergen Reactions  . Bydureon [Exenatide] Other (See Comments)    sweating  . Losartan Potassium Other (See Comments)    insomnia    Patient Measurements: Height: 5\' 10"  (177.8 cm) Weight: 218 lb 7.6 oz (99.1 kg) IBW/kg (Calculated) : 73 Heparin Dosing Weight: 96 kg  Vital Signs: Temp: 97.6 F (36.4 C) (07/29 1200) Temp Source: Oral (07/29 1200) Pulse Rate: 108 (07/29 1300)  Labs:  Recent Labs  09/04/14 0413  09/05/14 0415 09/05/14 1030 09/05/14 1037 09/05/14 1450 09/06/14 0335  HGB 10.0*  --  10.2*  --   --   --  10.0*  HCT 31.7*  --  31.4*  --   --   --  32.0*  PLT 281  --  254  --   --   --  235  APTT >200*  --  >200*  --  127*  --  138*  HEPARINUNFRC 1.50*  --  0.30 0.48  --   --  0.59  CREATININE 1.47*  < > 1.62*  1.69*  --   --  1.70* 1.80*  1.69*  < > = values in this interval not displayed.  Estimated Creatinine Clearance: 53.5 mL/min (by C-G formula based on Cr of 1.69).   Medications:  Heparin on hold  Assessment: 64 YOM on Xarelto PTA for hx Afib - held in the setting of AKI requiring CRRT and transitioned to heparin. The patient is noted to have both a PICC line and a RIJ HD catheter.  APTT remains elevated at 138 sec but HL is now therapeutic at 0.59.  He is still off systemic heparin but has heparin in CRRT. CBC remains low but stable with no significant s/s bleeding reported.   Goal of Therapy:  Heparin level 0.3-0.7 units/ml aPTT 66-102 seconds Monitor platelets by anticoagulation protocol: Yes   Plan:  - Continue to hold heparin gtt - F/u AM aPTT/HL  - Monitor for bleeding -F/U plan for long term AC   Bonnita Nasuti Pharm.D. CPP, BCPS Clinical Pharmacist [phone] 09/06/2014 2:30 PM

## 2014-09-06 NOTE — Progress Notes (Signed)
[facility] PROGRESS NOTE  Larry [name] NWG:[id number] DOB: [birth year] DOA: 08/25/2014 PCP: [name], MD   Assessment/Plan:  1. Severe nonischemic cardiomyopathy with chronic systolic heart failure with an EF of 25%. History of V. tach. Patient has AICD, on home milrinone drip. Experienced hypotension requiring norepinephrine and now on dobutamine; plan is to continue dobutamine instead of milrinone. -Patient was started on CVVHD on 08/31/2014 -Cardiology managing HF meds -He is tolerating CVVHD; but has remained oliguric and most likely will need permanent HD -Patient having urinary output in the 70-80 mL range daily.  2.  Acute on chronic renal failure -Labs showing ongoing downward trend in his creatinine -Xarelto was discontinued and started on IV heparin -Nephrology was consulted, feeling renal failure likely represents postoperative ATN/hypoperfusion from hypotension -CVVH was started on 08/31/2014 -A.m. labs on 09/04/2014 showing creatinine of 1.6 -still oliguric -Nephrology managing  -might ended on permanent HD  3. Constipation -Patient w/o diarrhea; reports BM are ok now.  4.  Hyponatremia -I suspect secondary to volume overload and renal failure -Improved after starting CRT, sodium stable now  5. Mechanical trip and fall with left femoral intertrochanteric fracture.  -status post intramedullary nail to left hip on 7/19 -WBAT -PT/OT evaluation -Plan for discharge to SNF when medically stable  6. Paroxysmal atrial fibrillation and pulmonary HTN.  -Xarelto discontinued due to worsening kidney function, now on IV heparin and with plans for coumadin eventually -On Amio continue revatio for pulm HTN  7. Obstructive sleep apnea.  -Patient refusing BiPAP  8. DM type II. recent A1c of 7.7 -lantus changed to 5 units along with SSI since 7/28, he had hypoglycemia on 7/27 -follow CBG's and adjust as needed  9. Insomnia: -will use restoril PRN low dose  and adjusted as needed  Code Status: Full Family Communication: wife at bedside Disposition Plan: Patient will remain on CCHF for now     Consultants:  Ortho (Dr. Percell Miller)  HF team   Nephrology  Procedures:  S/P intramedullary nail on left hip on 7/18  CVVH   Antibiotics:  None   HPI/Subjective: Patient is eating better, no further hypoglycemia or hypotensive episodes. He is afebrile. Now on dobutamine drip. Still with very poor urine output. Main complain is insomnia   Objective: Filed Vitals:   09/06/14 0800  BP:   Pulse: 62  Temp:   Resp: 13    Intake/Output Summary (Last 24 hours) at 09/06/14 0831 Last data filed at 09/06/14 0800  Gross per 24 hour  Intake 1240.6 ml  Output   3007 ml  Net -1766.4 ml   Filed Weights   09/04/14 0800 09/05/14 0500 09/06/14 0500  Weight: 102.4 kg (225 lb 12 oz) 101.1 kg (222 lb 14.2 oz) 99.1 kg (218 lb 7.6 oz)    Exam:   General:  He is awake and alert, oriented X 3, follows commands, chronically ill-appearing; O2 supplementation on board through [place]. Main complaint is insomnia.  Cardiovascular:no rubs, no gallops, RRR  Respiratory: Normal respiratory effort, lungs are clear to auscultation bilaterally  Abdomen: soft, NT, ND, positive Bowel sounds  Musculoskeletal: Surgical incision site appears clean, no evidence of infection. Still with pedal edema and complaining of pain on his left knee cap and left proximal tight    Data Reviewed: Basic Metabolic Panel:  Recent Labs Lab 09/02/14 0400  09/03/14 0430 09/03/14 1628 09/04/14 0413 09/04/14 1652 09/05/14 0415 09/05/14 1450 09/06/14 0335  NA 131*  < > 135  133* 133* 131* 133*  132*  132* 133* 132*  132*  K 4.4  < > 3.6  3.5 3.5 3.3* 3.9 4.0  4.0 4.1 4.3  4.3  CL 98*  < > 100*  98* 100* 99* 97* 99*  98* 98* 99*  101  CO2 26  < > 27  26 26 27 26 27  26 27 26  27   GLUCOSE 174*  < > 116*  156* 111* 119* 152* 120*  120* 127* 125*  124*  BUN 19  <  > 9  9 7 6  <5* <5*  5* <5* 6  6  CREATININE 2.12*  < > 1.70*  1.58* 1.59* 1.47* 1.24 1.62*  1.69* 1.70* 1.80*  1.69*  CALCIUM 8.0*  < > 8.2*  8.2* 8.1* 8.1* 7.7* 8.0*  8.0* 8.2* 8.3*  8.3*  MG 2.4  --  2.5*  --  2.3  --  2.2  --  2.4  PHOS 2.4*  < > 2.8 2.4*  --  2.2* 2.3* 3.2 2.9  < > = values in this interval not displayed. CBC:  Recent Labs Lab 08/30/14 1744  09/02/14 0400 09/03/14 0430 09/04/14 0413 09/05/14 0415 09/06/14 0335  WBC 7.5  < > 7.5 7.4 8.9 9.5 7.1  NEUTROABS 5.4  --   --   --   --   --   --   HGB 9.2*  < > 9.3* 9.7* 10.0* 10.2* 10.0*  HCT 28.5*  < > 29.1* 30.9* 31.7* 31.4* 32.0*  MCV 94.1  < > 98.6 99.7 98.8 98.4 99.4  PLT 258  < > 258 252 281 254 235  < > = values in this interval not displayed. BNP (last 3 results)  Recent Labs  05/22/14 1029 05/27/14 1126 06/19/14 1030  BNP 935.2* 1627.8* 738.9*    ProBNP (last 3 results)  Recent Labs  10/09/13 0926 11/14/13 0926 01/14/14 1112  PROBNP 1561.0* 1261.0* 5769.0*   CBG:  Recent Labs Lab 09/05/14 1150 09/05/14 1559 09/05/14 1751 09/05/14 2138 09/06/14 0728  GLUCAP 117* 133* 111* 138* 118*    Recent Results (from the past 240 hour(s))  Clostridium Difficile by PCR (not at [facility])     Status: None   Collection Time: 09/02/14 10:27 PM  Result Value Ref Range Status   C difficile by pcr NEGATIVE NEGATIVE Final     Studies: Dg Chest Port 1 View  09/04/2014   CLINICAL DATA:  Shortness of breath/hypoxia  EXAM: PORTABLE CHEST - 1 VIEW  COMPARISON:  August 31, 2014  FINDINGS: Central catheter tip is in the superior vena cava. No pneumothorax. Pacemaker leads are attached to the right atrium and right ventricle. Heart is prominent but stable. The pulmonary vascularity is normal. No edema or consolidation. No adenopathy.  IMPRESSION: No edema or consolidation. No change in cardiac silhouette. No pneumothorax.   Electronically Signed   By: Lowella Grip III M.D.   On: 09/04/2014 14:21     Scheduled Meds: . sodium chloride   Intravenous Once  . sodium chloride  10 mL/hr Intravenous Once  . ALPRAZolam  0.5 mg Oral QHS  . amiodarone  400 mg Oral BID  . atorvastatin  40 mg Oral q1800  . chlorhexidine  15 mL Mouth/Throat BID  . DULoxetine  30 mg Oral Daily  . feeding supplement (NEPRO CARB STEADY)  237 mL Oral BID BM  . insulin aspart  0-9 Units Subcutaneous TID AC & HS  . insulin glargine  5 Units Subcutaneous QHS  .  levothyroxine  75 mcg Oral QAC breakfast  . potassium chloride  40 mEq Oral Once  . sildenafil  80 mg Oral TID  . sodium chloride irrigation  200 mL Irrigation Q2H while awake   Continuous Infusions: . sodium chloride 10 mL/hr at 08/29/14 0543  . DOBUTamine 2.5 mcg/kg/min (09/05/14 0800)  . heparin 10,000 units/ 20 mL infusion syringe 1,600 Units/hr (09/06/14 0808)  . norepinephrine (LEVOPHED) Adult infusion Stopped (09/05/14 0615)  . dialysis replacement fluid (prismasate) 700 mL/hr at 09/06/14 0745  . dialysis replacement fluid (prismasate) 700 mL/hr at 09/06/14 0745  . dialysate (PRISMASATE) 1,500 mL/hr at 09/06/14 0648  . sodium chloride irrigation      Principal Problem:   Fracture, intertrochanteric, left femur Active Problems:   Essential hypertension, benign   Persistent atrial fibrillation   Automatic implantable cardioverter-defibrillator in situ   DM2 (diabetes mellitus, type 2)   OSA (obstructive sleep apnea), intolerant to CPAP/BIPAP   Ventricular tachycardia   Chronic systolic CHF (congestive heart failure)   Hip fracture   Left femoral shaft fracture   Acute on chronic renal failure   Hyponatremia   Acute on chronic systolic CHF (congestive heart failure)   Acute renal failure superimposed on stage 4 chronic kidney disease   SOB (shortness of breath)   Time spent: 30 minutes   Barton Dubois  [facility] Pager [phone]. If 7PM-7AM, please contact night-coverage at [email or web address], password [facility] 09/06/2014, 8:31 AM   LOS: 12 days

## 2014-09-06 NOTE — Progress Notes (Addendum)
Patient ID: Larry [name], male   DOB: August 05, 1950, 64 y.o.   MRN: [id number] Advanced Heart Failure Rounding Note   Subjective:    Mr. Aikey is a 64 y/o man with history of chronic systolic CHF (nonischemic cardiomyopathy) s/p 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 He is on chronic milrinone 0.5 mcg.  Prior to admit he was being considered for LVAD.   Dischaged 7/16 after dental procedures. Also had RHC with elevate pulmonary pressures. Revatio was increased to 80 mg tid during that stay.  Admitted with fall---> broken L  hip . 7/18 had IM Nail L femur.  7/23 started CVVHD.   Hypotensive 7/28, stopped milrinone and started norepinephrine.  Pressure recovered, now off norepinephrine and on dobutamine 2.5 mcg/kg/min.  BP stable today.  UF at 70 cc/hr on CVVH.  He is awake and alert. CVP 10, co-ox 72%.  Still very poor UOP.    Objective:   Weight Range:  Vital Signs:   Temp:  [97 F (36.1 C)-98.2 F (36.8 C)] 97 F (36.1 C) (07/29 0730) Pulse Rate:  [62-70] 62 (07/29 0800) Resp:  [10-19] 13 (07/29 0800) SpO2:  [100 %] 100 % (07/29 0800) Arterial Line BP: (110-135)/(43-54) 134/54 mmHg (07/29 0800) Weight:  [218 lb 7.6 oz (99.1 kg)] 218 lb 7.6 oz (99.1 kg) (07/29 0500) Last BM Date: 09/04/14  Weight change: Filed Weights   09/04/14 0800 09/05/14 0500 09/06/14 0500  Weight: 225 lb 12 oz (102.4 kg) 222 lb 14.2 oz (101.1 kg) 218 lb 7.6 oz (99.1 kg)    Intake/Output:   Intake/Output Summary (Last 24 hours) at 09/06/14 0848 Last data filed at 09/06/14 0800  Gross per 24 hour  Intake 1248.2 ml  Output   3007 ml  Net -1758.8 ml     Physical Exam: General:  NAD  HEENT: normal Neck: supple. JVP 10 cm. RIJ trialysis catheter.  Carotids 2+ bilat; no bruits. No lymphadenopathy or thryomegaly appreciated. Cor: PMI nondisplaced. Regular rate & rhythm. No rubs, gallops or murmurs. Lungs: Diminished  both bases with mild crackles Abdomen: soft, nontender, nondistended. No hepatosplenomegaly. No bruits or masses. Good bowel sounds. Extremities: no cyanosis, clubbing, rash. RUE PICC dual lumen . R Thigh lateral aspect dressing intact x2. 1+ ankle edema. Neuro: Awake, alert.   Telemetry: NSR with PVCs  Labs: Basic Metabolic Panel:  Recent Labs Lab 09/02/14 0400  09/03/14 0430 09/03/14 1628 09/04/14 0413 09/04/14 1652 09/05/14 0415 09/05/14 1450 09/06/14 0335  NA 131*  < > 135  133* 133* 131* 133* 132*  132* 133* 132*  132*  K 4.4  < > 3.6  3.5 3.5 3.3* 3.9 4.0  4.0 4.1 4.3  4.3  CL 98*  < > 100*  98* 100* 99* 97* 99*  98* 98* 99*  101  CO2 26  < > 27  26 26 27 26 27  26 27 26  27   GLUCOSE 174*  < > 116*  156* 111* 119* 152* 120*  120* 127* 125*  124*  BUN 19  < > 9  9 7 6  <5* <5*  5* <5* 6  6  CREATININE 2.12*  < > 1.70*  1.58* 1.59* 1.47* 1.24 1.62*  1.69* 1.70* 1.80*  1.69*  CALCIUM 8.0*  < > 8.2*  8.2* 8.1* 8.1* 7.7* 8.0*  8.0* 8.2* 8.3*  8.3*  MG 2.4  --  2.5*  --  2.3  --  2.2  --  2.4  PHOS 2.4*  < > 2.8 2.4*  --  2.2* 2.3* 3.2 2.9  < > = values in this interval not displayed.  Liver Function Tests:  Recent Labs Lab 09/04/14 0413 09/04/14 1652 09/05/14 0415 09/05/14 1450 09/06/14 0335  AST 26  --   --   --   --   ALT 10*  --   --   --   --   ALKPHOS 68  --   --   --   --   BILITOT 1.1  --   --   --   --   PROT 6.7  --   --   --   --   ALBUMIN 2.7* 3.0* 2.7* 2.7* 2.7*   No results for input(s): LIPASE, AMYLASE in the last 168 hours. No results for input(s): AMMONIA in the last 168 hours.  CBC:  Recent Labs Lab 08/30/14 1744  09/02/14 0400 09/03/14 0430 09/04/14 0413 09/05/14 0415 09/06/14 0335  WBC 7.5  < > 7.5 7.4 8.9 9.5 7.1  NEUTROABS 5.4  --   --   --   --   --   --   HGB 9.2*  < > 9.3* 9.7* 10.0* 10.2* 10.0*  HCT 28.5*  < > 29.1* 30.9* 31.7* 31.4* 32.0*  MCV 94.1  < > 98.6 99.7 98.8 98.4 99.4  PLT 258  < > 258 252  281 254 235  < > = values in this interval not displayed.  Cardiac Enzymes: No results for input(s): CKTOTAL, CKMB, CKMBINDEX, TROPONINI in the last 168 hours.  BNP: BNP (last 3 results)  Recent Labs  05/22/14 1029 05/27/14 1126 06/19/14 1030  BNP 935.2* 1627.8* 738.9*    ProBNP (last 3 results)  Recent Labs  10/09/13 0926 11/14/13 0926 01/14/14 1112  PROBNP 1561.0* 1261.0* 5769.0*      Other results:  Imaging: Dg Chest Port 1 View  09/04/2014   CLINICAL DATA:  Shortness of breath/hypoxia  EXAM: PORTABLE CHEST - 1 VIEW  COMPARISON:  August 31, 2014  FINDINGS: Central catheter tip is in the superior vena cava. No pneumothorax. Pacemaker leads are attached to the right atrium and right ventricle. Heart is prominent but stable. The pulmonary vascularity is normal. No edema or consolidation. No adenopathy.  IMPRESSION: No edema or consolidation. No change in cardiac silhouette. No pneumothorax.   Electronically Signed   By: Lowella Grip III M.D.   On: 09/04/2014 14:21     Medications:     Scheduled Medications: . sodium chloride   Intravenous Once  . sodium chloride  10 mL/hr Intravenous Once  . ALPRAZolam  0.5 mg Oral QHS  . amiodarone  400 mg Oral BID  . atorvastatin  40 mg Oral q1800  . chlorhexidine  15 mL Mouth/Throat BID  . DULoxetine  30 mg Oral Daily  . feeding supplement (NEPRO CARB STEADY)  237 mL Oral BID BM  . insulin aspart  0-9 Units Subcutaneous TID AC & HS  . insulin glargine  5 Units Subcutaneous QHS  . levothyroxine  75 mcg Oral QAC breakfast  . potassium chloride  40 mEq Oral Once  . sildenafil  80 mg Oral TID  . sodium chloride irrigation  200 mL Irrigation Q2H while awake    Infusions: . sodium chloride 10 mL/hr at 08/29/14 0543  . DOBUTamine 2.5 mcg/kg/min (09/05/14 0800)  . heparin 10,000 units/ 20 mL infusion syringe 1,600 Units/hr (09/06/14 0808)  . norepinephrine (LEVOPHED)  Adult infusion Stopped (09/05/14 0615)  . dialysis  replacement fluid (prismasate) 700 mL/hr at 09/06/14 0745  . dialysis replacement fluid (prismasate) 700 mL/hr at 09/06/14 0745  . dialysate (PRISMASATE) 1,500 mL/hr at 09/06/14 0648  . sodium chloride irrigation      PRN Medications: Place/Maintain arterial line **AND** sodium chloride, albuterol, ALPRAZolam **AND** ALPRAZolam, heparin, heparin, morphine injection, ondansetron (ZOFRAN) IV, oxyCODONE-acetaminophen **AND** oxyCODONE, sodium chloride, sodium chloride, zolpidem   Assessment:  1. Left Hip Fracture s/p IM nail on 08/26/14 2. Chronic systolic CHF- Nonischemic cardiomyopathy, on home milrinone 0.5 mcg/kg/min, decreased to 0.25 here with NSVT. Became hypotensive, now on dobutamine 2.5.  3. Acute on chronic renal failure - suspect post-op ATN  4.  Paroxysmal atrial fibrillation requiring DCCV 02/2014, 04/2014- on chronic amio and xarelto. CHADS VASc Score- 5.  Remains in NSR.  5. Diabetes mellitus type 2 6. Depression/anxiety 7. OSA, complex- Needs Bipap at night but had difficulty after dental procedure.  8 Pulmonary HTN - Mixed pulmonary venous hypertension and PAH- on Revatio 80 mg tid 9. Dental extractions 08/2014 by Dr Enrique Sack 10. Obesity- Body mass index is 32.39 kg/(m^2). 11. Probable chronic respiratory failure - requiring home O2 with ambulation as of this admission 12. Hyponatremia   13. NSVT 14. Delirium- Improved.   Plan/Discussion:    Now on CVVHD. Suspect post-op ATN, oliguric (47 cc/24 hrs).  CVP remains mildly elevated at 10. Fluid removal at 70 cc/hr, Awaiting renal recovery but still very poor UOP.  Unfortunately, suspect he is going to have to go on HD.  BP now stable, can transition over to intermittent hemodialysis when renal thinks appropriate.  Switched over now to dobutamine, think this will be safer than milrinone with intermittent HD.  Co-ox 72%.    Revatio for pulmonary hypertension.   Reloading amiodarone with ventricular ectopy at 400 mg bid.    Sinus rhythm with PVCs, no atrial fibrillation. Long PR interval noted on EKG. Continue amiodarone. Off Metoprolol XL with soft BP. Off Xarelto due to renal failure. Now on heparin gtt => eventually transition to warfarin. Pharmacy following.   Needs PT.   Length of Stay: 12  Rosalie Buenaventura,MD 8:48 AM Advanced Heart Failure Team Pager [phone] (M-F; 7a - 4p)  Please contact [facility] for night-coverage after hours (4p -7a ) and weekends on [email or web address]

## 2014-09-06 NOTE — Progress Notes (Signed)
Physical Therapy Treatment Patient Details Name: Larry [name] MRN: [id number] DOB: 1950-05-05 Today's Date: 09/06/2014    History of Present Illness Larry [name] is a 64 y.o. male who complains of left hip pain after sustaining a mechanical fall at home. Pt with left intertrochanteric fx s/p IM nail. PMHx NICM, CAD, DDD, back pain, CVA, obesity. 7/23 CRRT initiated    PT Comments    Pt pleasant, conversing and recalling events since admission. Pt encouraged to continue HEP throughout the day and states he is trying to move legs throughout the day but limited by positioning and back pain. Pt eager to get OOB and hopeful that CRRT will end soon so that he may. Will continue to follow.   Follow Up Recommendations  SNF;Supervision for mobility/OOB     Equipment Recommendations       Recommendations for Other Services       Precautions / Restrictions Precautions Precautions: Fall Precaution Comments: CRRT Restrictions Weight Bearing Restrictions: Yes LLE Weight Bearing: Weight bearing as tolerated    Mobility  Bed Mobility               General bed mobility comments: MD requested bed level exercises  Transfers                    Ambulation/Gait                 Stairs            Wheelchair Mobility    Modified Rankin (Stroke Patients Only)       Balance                                    Cognition Arousal/Alertness: Awake/alert Behavior During Therapy: Flat affect Overall Cognitive Status: Impaired/Different from baseline Area of Impairment: Orientation;Memory Orientation Level: Disoriented to;Time Current Attention Level: Alternating Memory: Decreased short-term memory         General Comments: appropriate conversation, at times joking with friend. able to continue UE exercises while conversing.    Exercises General Exercises - Upper Extremity Shoulder Flexion: Strengthening;Both;20 reps;Seated;Bar  weights/barbell Bar Weights/Barbell (Shoulder Flexion): 1 lb Shoulder ABduction: Strengthening;Both;20 reps;Seated;Bar weights/barbell Bar Weights/Barbell (Shoulder Abduction): 1 lb Elbow Flexion: Strengthening;Both;20 reps;Seated;Bar weights/barbell Bar Weights/Barbell (Elbow Flexion): 1 lb Elbow Extension: Strengthening;Both;20 reps;Seated;Bar weights/barbell Bar Weights/Barbell (Elbow Extension): 1 lb General Exercises - Lower Extremity Ankle Circles/Pumps: AROM;Both;15 reps;Supine Short Arc Quad: AROM;Right;Supine;15 reps;AAROM;Left (AROM on right, AAROM on LLE) Heel Slides: AROM;AAROM;Right;Left;15 reps;Supine (AROM on right, AAROM on LLE) Hip ABduction/ADduction: AROM;AAROM;Right;Left;15 reps;Supine (AROM on right, AAROM on LLE)    General Comments        Pertinent Vitals/Pain Pain Assessment: Faces Pain Score: 5  Faces Pain Scale: Hurts little more Pain Location: back and left hip with exercises Pain Descriptors / Indicators: Aching Pain Intervention(s): Premedicated before session;Repositioned  VSS    Home Living                      Prior Function            PT Goals (current goals can now be found in the care plan section) Progress towards PT goals: Not progressing toward goals - comment (secondary to medical complications and CRRT)    Frequency       PT Plan Current plan remains appropriate    Co-evaluation  End of Session Equipment Utilized During Treatment: Oxygen Activity Tolerance: Patient limited by fatigue Patient left: in bed;with call bell/phone within reach (in chair position which decreased pt's back pain)     Time: 0601-5615 PT Time Calculation (min) (ACUTE ONLY): 22 min  Charges:  $Therapeutic Exercise: 8-22 mins                    G Codes:      [name] 16-Sep-2014, 1:15 PM [name], [place]

## 2014-09-06 NOTE — Progress Notes (Signed)
Pt does not wish to wear CPAP tonight. Pt encouraged to call RT if pt changes mind. No distress noted. 

## 2014-09-07 LAB — CBC
HCT: 30.6 % — ABNORMAL LOW (ref 39.0–52.0)
HEMOGLOBIN: 9.3 g/dL — AB (ref 13.0–17.0)
MCH: 30.1 pg (ref 26.0–34.0)
MCHC: 30.4 g/dL (ref 30.0–36.0)
MCV: 99 fL (ref 78.0–100.0)
PLATELETS: 203 10*3/uL (ref 150–400)
RBC: 3.09 MIL/uL — ABNORMAL LOW (ref 4.22–5.81)
RDW: 16.3 % — ABNORMAL HIGH (ref 11.5–15.5)
WBC: 7.1 10*3/uL (ref 4.0–10.5)

## 2014-09-07 LAB — POCT ACTIVATED CLOTTING TIME
ACTIVATED CLOTTING TIME: 177 s
ACTIVATED CLOTTING TIME: 183 s
ACTIVATED CLOTTING TIME: 196 s
ACTIVATED CLOTTING TIME: 214 s
Activated Clotting Time: 190 seconds
Activated Clotting Time: 196 seconds
Activated Clotting Time: 208 seconds
Activated Clotting Time: 159 seconds
Activated Clotting Time: 165 seconds
Activated Clotting Time: 220 seconds
Activated Clotting Time: 232 seconds

## 2014-09-07 LAB — CARBOXYHEMOGLOBIN
Carboxyhemoglobin: 1.1 % (ref 0.5–1.5)
Carboxyhemoglobin: 1.3 % (ref 0.5–1.5)
METHEMOGLOBIN: 0.9 % (ref 0.0–1.5)
Methemoglobin: 0.9 % (ref 0.0–1.5)
O2 Saturation: 54 %
O2 Saturation: 60.4 %
TOTAL HEMOGLOBIN: 9.8 g/dL — AB (ref 13.5–18.0)
Total hemoglobin: 13.6 g/dL (ref 13.5–18.0)

## 2014-09-07 LAB — RENAL FUNCTION PANEL
Albumin: 2.7 g/dL — ABNORMAL LOW (ref 3.5–5.0)
Anion gap: 6 (ref 5–15)
BUN: 6 mg/dL (ref 6–20)
CO2: 28 mmol/L (ref 22–32)
Calcium: 8.5 mg/dL — ABNORMAL LOW (ref 8.9–10.3)
Chloride: 99 mmol/L — ABNORMAL LOW (ref 101–111)
Creatinine, Ser: 1.72 mg/dL — ABNORMAL HIGH (ref 0.61–1.24)
GFR, EST AFRICAN AMERICAN: 47 mL/min — AB (ref >60)
GFR, EST NON AFRICAN AMERICAN: 41 mL/min — AB (ref >60)
Glucose, Bld: 129 mg/dL — ABNORMAL HIGH (ref 65–99)
PHOSPHORUS: 2.4 mg/dL — AB (ref 2.5–4.6)
Potassium: 4.3 mmol/L (ref 3.5–5.1)
Sodium: 133 mmol/L — ABNORMAL LOW (ref 135–145)

## 2014-09-07 LAB — GLUCOSE, CAPILLARY
GLUCOSE-CAPILLARY: 124 mg/dL — AB (ref 65–99)
GLUCOSE-CAPILLARY: 120 mg/dL — AB (ref 65–99)
GLUCOSE-CAPILLARY: 137 mg/dL — AB (ref 65–99)
GLUCOSE-CAPILLARY: 124 mg/dL — AB (ref 65–99)
Glucose-Capillary: 127 mg/dL — ABNORMAL HIGH (ref 65–99)
Glucose-Capillary: 140 mg/dL — ABNORMAL HIGH (ref 65–99)

## 2014-09-07 LAB — PHOSPHORUS: Phosphorus: 2.4 mg/dL — ABNORMAL LOW (ref 2.5–4.6)

## 2014-09-07 LAB — COMPREHENSIVE METABOLIC PANEL
ALT: 12 U/L — ABNORMAL LOW (ref 17–63)
AST: 31 U/L (ref 15–41)
Albumin: 2.8 g/dL — ABNORMAL LOW (ref 3.5–5.0)
Alkaline Phosphatase: 79 U/L (ref 38–126)
Anion gap: 6 (ref 5–15)
BILIRUBIN TOTAL: 0.8 mg/dL (ref 0.3–1.2)
BUN: 7 mg/dL (ref 6–20)
CO2: 26 mmol/L (ref 22–32)
Calcium: 8.3 mg/dL — ABNORMAL LOW (ref 8.9–10.3)
Chloride: 100 mmol/L — ABNORMAL LOW (ref 101–111)
Creatinine, Ser: 1.74 mg/dL — ABNORMAL HIGH (ref 0.61–1.24)
GFR calc Af Amer: 47 mL/min — ABNORMAL LOW (ref >60)
GFR calc non Af Amer: 40 mL/min — ABNORMAL LOW (ref >60)
GLUCOSE: 147 mg/dL — AB (ref 65–99)
Potassium: 4.1 mmol/L (ref 3.5–5.1)
Sodium: 132 mmol/L — ABNORMAL LOW (ref 135–145)
TOTAL PROTEIN: 6.7 g/dL (ref 6.5–8.1)

## 2014-09-07 LAB — MAGNESIUM: Magnesium: 2.3 mg/dL (ref 1.7–2.4)

## 2014-09-07 LAB — APTT: APTT: 151 s — AB (ref 24–37)

## 2014-09-07 LAB — HEPARIN LEVEL (UNFRACTIONATED): HEPARIN UNFRACTIONATED: 0.51 [IU]/mL (ref 0.30–0.70)

## 2014-09-07 MED ORDER — AMIODARONE HCL 200 MG PO TABS
200.0000 mg | ORAL_TABLET | Freq: Two times a day (BID) | ORAL | Status: DC
  Administered 2014-09-07 – 2014-09-12 (×12): 200 mg via ORAL
  Filled 2014-09-07 (×14): qty 1

## 2014-09-07 NOTE — Progress Notes (Signed)
[facility] PROGRESS NOTE  Larry [name] PTW:[id number] DOB: [birth year] DOA: 08/25/2014 PCP: [name], MD   Assessment/Plan:  1. Severe nonischemic cardiomyopathy with chronic systolic heart failure with an EF of 25%. History of V. tach. Patient has AICD, on home milrinone drip. Experienced hypotension requiring norepinephrine and now on dobutamine; plan is to continue dobutamine instead of milrinone. -Patient was started on CVVHD on 08/31/2014 -Cardiology managing HF meds -He is tolerating CVVHD; but has remained oliguric and most likely will need permanent HD -Patient having urinary output in the 70-80 mL range daily.  2.  Acute on chronic renal failure -Labs showing ongoing downward trend in his creatinine -Xarelto was discontinued and started on IV heparin -Nephrology was consulted, feeling renal failure likely represents postoperative ATN/hypoperfusion from hypotension -CVVH was started on 08/31/2014 -A.m. labs on 09/04/2014 showing creatinine of 1.6 -still oliguric -Nephrology managing  -might ended on permanent HD  3. Constipation -Patient w/o diarrhea; reports BM are ok now.  4.  Hyponatremia -I suspect secondary to volume overload and renal failure -Improved after starting CRT, sodium stable now  5. Mechanical trip and fall with left femoral intertrochanteric fracture.  -status post intramedullary nail to left hip on 7/19 -WBAT -PT/OT evaluation -Plan for discharge to SNF when medically stable  6. Paroxysmal atrial fibrillation and pulmonary HTN.  -Xarelto discontinued due to worsening kidney function, now on IV heparin and with plans for coumadin eventually -On Amio continue revatio for pulm HTN  7. Obstructive sleep apnea.  -Patient refusing BiPAP  8. DM type II. recent A1c of 7.7 -lantus changed to 5 units along with SSI since 7/28, he had hypoglycemia on 7/27 -follow CBG's and adjust as needed  9. Insomnia: -will continue using restoril  PRN   Code Status: Full Family Communication: wife at bedside Disposition Plan: Patient will remain on CCHF for now     Consultants:  Ortho (Dr. Percell Miller)  HF team   Nephrology  Procedures:  S/P intramedullary nail on left hip on 7/18  CVVH   Antibiotics:  None   HPI/Subjective: Patient is eating better, no further hypoglycemia or hypotensive episodes. He is afebrile. Now on dobutamine drip. Still with very poor urine output and using CCVH. Good night sleep with use of restoril    Objective: Filed Vitals:   09/07/14 1400  BP:   Pulse: 59  Temp:   Resp: 12    Intake/Output Summary (Last 24 hours) at 09/07/14 1555 Last data filed at 09/07/14 1500  Gross per 24 hour  Intake  725.6 ml  Output   2466 ml  Net -1740.4 ml   Filed Weights   09/05/14 0500 09/06/14 0500 09/07/14 0500  Weight: 101.1 kg (222 lb 14.2 oz) 99.1 kg (218 lb 7.6 oz) 95.6 kg (210 lb 12.2 oz)    Exam:   General:  He is awake and alert, oriented X 3, follows commands, chronically ill-appearing; O2 supplementation on board through [place]. reprots good night sleep with use of restoril. VSS  Cardiovascular:no rubs, no gallops, RRR  Respiratory: Normal respiratory effort, lungs are clear to auscultation bilaterally  Abdomen: soft, NT, ND, positive Bowel sounds  Musculoskeletal: Surgical incision site appears clean, no evidence of infection. Still with pedal edema and complaining of pain on his left knee cap and left proximal tight    Data Reviewed: Basic Metabolic Panel:  Recent Labs Lab 09/03/14 0430  09/04/14 0413  09/05/14 0415 09/05/14 1450 09/06/14 0335 09/06/14 1600 09/07/14 0445  NA 135  133*  < > 131*  < > 132*  132* 133* 132*  132* 135 132*  K 3.6  3.5  < > 3.3*  < > 4.0  4.0 4.1 4.3  4.3 4.3 4.1  CL 100*  98*  < > 99*  < > 99*  98* 98* 99*  101 101 100*  CO2 27  26  < > 27  < > 27  26 27 26  27 27 26   GLUCOSE 116*  156*  < > 119*  < > 120*  120* 127* 125*  124*  140* 147*  BUN 9  9  < > 6  < > <5*  5* <5* 6  6 6 7   CREATININE 1.70*  1.58*  < > 1.47*  < > 1.62*  1.69* 1.70* 1.80*  1.69* 1.69* 1.74*  CALCIUM 8.2*  8.2*  < > 8.1*  < > 8.0*  8.0* 8.2* 8.3*  8.3* 8.4* 8.3*  MG 2.5*  --  2.3  --  2.2  --  2.4  --  2.3  PHOS 2.8  < >  --   < > 2.3* 3.2 2.9 2.5 2.4*  < > = values in this interval not displayed. CBC:  Recent Labs Lab 09/03/14 0430 09/04/14 0413 09/05/14 0415 09/06/14 0335 09/07/14 0445  WBC 7.4 8.9 9.5 7.1 7.1  HGB 9.7* 10.0* 10.2* 10.0* 9.3*  HCT 30.9* 31.7* 31.4* 32.0* 30.6*  MCV 99.7 98.8 98.4 99.4 99.0  PLT 252 281 254 235 203   BNP (last 3 results)  Recent Labs  05/22/14 1029 05/27/14 1126 06/19/14 1030  BNP 935.2* 1627.8* 738.9*    ProBNP (last 3 results)  Recent Labs  10/09/13 0926 11/14/13 0926 01/14/14 1112  PROBNP 1561.0* 1261.0* 5769.0*   CBG:  Recent Labs Lab 09/06/14 1126 09/06/14 1541 09/06/14 2215 09/07/14 0844 09/07/14 1214  GLUCAP 143* 120* 127* 124* 140*    Recent Results (from the past 240 hour(s))  Clostridium Difficile by PCR (not at [facility])     Status: None   Collection Time: 09/02/14 10:27 PM  Result Value Ref Range Status   C difficile by pcr NEGATIVE NEGATIVE Final     Studies: No results found.  Scheduled Meds: . sodium chloride   Intravenous Once  . sodium chloride  10 mL/hr Intravenous Once  . amiodarone  200 mg Oral BID  . atorvastatin  40 mg Oral q1800  . chlorhexidine  15 mL Mouth/Throat BID  . DULoxetine  30 mg Oral Daily  . feeding supplement (NEPRO CARB STEADY)  237 mL Oral BID BM  . insulin aspart  0-9 Units Subcutaneous TID AC & HS  . insulin glargine  5 Units Subcutaneous QHS  . levothyroxine  75 mcg Oral QAC breakfast  . potassium chloride  40 mEq Oral Once  . sildenafil  80 mg Oral TID  . sodium chloride irrigation  200 mL Irrigation Q2H while awake   Continuous Infusions: . sodium chloride 10 mL/hr at 08/29/14 0543  . DOBUTamine 2.5  mcg/kg/min (09/07/14 0835)  . heparin 10,000 units/ 20 mL infusion syringe 1,750 Units/hr (09/07/14 1400)  . norepinephrine (LEVOPHED) Adult infusion Stopped (09/05/14 0615)  . dialysis replacement fluid (prismasate) 700 mL/hr at 09/07/14 0552  . dialysis replacement fluid (prismasate) 700 mL/hr at 09/07/14 0556  . dialysate (PRISMASATE) 1,500 mL/hr at 09/07/14 1448  . sodium chloride irrigation      Principal Problem:   Fracture, intertrochanteric, left femur Active Problems:  Essential hypertension, benign   Persistent atrial fibrillation   Automatic implantable cardioverter-defibrillator in situ   DM2 (diabetes mellitus, type 2)   OSA (obstructive sleep apnea), intolerant to CPAP/BIPAP   Ventricular tachycardia   Chronic systolic CHF (congestive heart failure)   Hip fracture   Left femoral shaft fracture   Acute on chronic renal failure   Hyponatremia   Acute on chronic systolic CHF (congestive heart failure)   Acute renal failure superimposed on stage 4 chronic kidney disease   SOB (shortness of breath)   Insomnia   Time spent: 30 minutes   Barton Dubois  [facility] Pager [phone]. If 7PM-7AM, please contact night-coverage at [email or web address], password [facility] [place] 09/07/2014, 3:55 PM  LOS: 13 days

## 2014-09-07 NOTE — Progress Notes (Signed)
Patient ID: Larry [name], male   DOB: 11/01/1950, 64 y.o.   MRN: [id number] Advanced Heart Failure Rounding Note   Subjective:    Larry [name] is a 64 y/o man with history of chronic systolic CHF (nonischemic cardiomyopathy) s/p 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 He is on chronic milrinone 0.5 mcg.  Prior to admit he was being considered for LVAD.   Dischaged 7/16 after dental procedures. Also had RHC with elevate pulmonary pressures. Revatio was increased to 80 mg tid during that stay.  Admitted with fall---> broken L  hip . 7/18 had IM Nail L femur.  7/23 started CVVHD.   Hypotensive 7/28, stopped milrinone and started norepinephrine.  Pressure recovered, now off norepinephrine and on dobutamine 2.5 mcg/kg/min.  BP stable today.  UF at 70 cc/hr on CVVH.  He is awake and alert. CVP 12, co-ox 60%.  Still very poor UOP.    Objective:   Weight Range:  Vital Signs:   Temp:  [97 F (36.1 C)-97.8 F (36.6 C)] 97.7 F (36.5 C) (07/30 0400) Pulse Rate:  [59-108] 60 (07/30 0600) Resp:  [9-26] 9 (07/30 0600) SpO2:  [92 %-100 %] 100 % (07/30 0600) Arterial Line BP: (101-134)/(41-57) 119/49 mmHg (07/30 0600) Weight:  [210 lb 12.2 oz (95.6 kg)] 210 lb 12.2 oz (95.6 kg) (07/30 0500) Last BM Date: 09/04/14  Weight change: Filed Weights   09/05/14 0500 09/06/14 0500 09/07/14 0500  Weight: 222 lb 14.2 oz (101.1 kg) 218 lb 7.6 oz (99.1 kg) 210 lb 12.2 oz (95.6 kg)    Intake/Output:   Intake/Output Summary (Last 24 hours) at 09/07/14 0729 Last data filed at 09/07/14 0700  Gross per 24 hour  Intake 1085.4 ml  Output   3126 ml  Net -2040.6 ml     Physical Exam: General:  NAD  HEENT: normal Neck: supple. JVP 10 cm. RIJ trialysis catheter.  Carotids 2+ bilat; no bruits. No lymphadenopathy or thryomegaly appreciated. Cor: PMI nondisplaced. Regular rate & rhythm. No rubs, gallops or murmurs. Lungs:  Diminished both bases with mild crackles Abdomen: soft, nontender, nondistended. No hepatosplenomegaly. No bruits or masses. Good bowel sounds. Extremities: no cyanosis, clubbing, rash. RUE PICC dual lumen . R Thigh lateral aspect dressing intact x2. 1+ ankle edema. Neuro: Awake, alert.   Telemetry: NSR with PVCs  Labs: Basic Metabolic Panel:  Recent Labs Lab 09/03/14 0430  09/04/14 0413  09/05/14 0415 09/05/14 1450 09/06/14 0335 09/06/14 1600 09/07/14 0445  NA 135  133*  < > 131*  < > 132*  132* 133* 132*  132* 135 132*  K 3.6  3.5  < > 3.3*  < > 4.0  4.0 4.1 4.3  4.3 4.3 4.1  CL 100*  98*  < > 99*  < > 99*  98* 98* 99*  101 101 100*  CO2 27  26  < > 27  < > 27  26 27 26  27 27 26   GLUCOSE 116*  156*  < > 119*  < > 120*  120* 127* 125*  124* 140* 147*  BUN 9  9  < > 6  < > <5*  5* <5* 6  6 6 7   CREATININE 1.70*  1.58*  < > 1.47*  < > 1.62*  1.69* 1.70* 1.80*  1.69* 1.69* 1.74*  CALCIUM 8.2*  8.2*  < > 8.1*  < > 8.0*  8.0* 8.2*  8.3*  8.3* 8.4* 8.3*  MG 2.5*  --  2.3  --  2.2  --  2.4  --  2.3  PHOS 2.8  < >  --   < > 2.3* 3.2 2.9 2.5 2.4*  < > = values in this interval not displayed.  Liver Function Tests:  Recent Labs Lab 09/04/14 0413  09/05/14 0415 09/05/14 1450 09/06/14 0335 09/06/14 1600 09/07/14 0445  AST 26  --   --   --   --   --  31  ALT 10*  --   --   --   --   --  12*  ALKPHOS 68  --   --   --   --   --  79  BILITOT 1.1  --   --   --   --   --  0.8  PROT 6.7  --   --   --   --   --  6.7  ALBUMIN 2.7*  < > 2.7* 2.7* 2.7* 2.7* 2.8*  < > = values in this interval not displayed. No results for input(s): LIPASE, AMYLASE in the last 168 hours. No results for input(s): AMMONIA in the last 168 hours.  CBC:  Recent Labs Lab 09/03/14 0430 09/04/14 0413 09/05/14 0415 09/06/14 0335 09/07/14 0445  WBC 7.4 8.9 9.5 7.1 7.1  HGB 9.7* 10.0* 10.2* 10.0* 9.3*  HCT 30.9* 31.7* 31.4* 32.0* 30.6*  MCV 99.7 98.8 98.4 99.4 99.0  PLT 252 281  254 235 203    Cardiac Enzymes: No results for input(s): CKTOTAL, CKMB, CKMBINDEX, TROPONINI in the last 168 hours.  BNP: BNP (last 3 results)  Recent Labs  05/22/14 1029 05/27/14 1126 06/19/14 1030  BNP 935.2* 1627.8* 738.9*    ProBNP (last 3 results)  Recent Labs  10/09/13 0926 11/14/13 0926 01/14/14 1112  PROBNP 1561.0* 1261.0* 5769.0*      Other results:  Imaging: No results found.   Medications:     Scheduled Medications: . sodium chloride   Intravenous Once  . sodium chloride  10 mL/hr Intravenous Once  . amiodarone  200 mg Oral BID  . atorvastatin  40 mg Oral q1800  . chlorhexidine  15 mL Mouth/Throat BID  . DULoxetine  30 mg Oral Daily  . feeding supplement (NEPRO CARB STEADY)  237 mL Oral BID BM  . insulin aspart  0-9 Units Subcutaneous TID AC & HS  . insulin glargine  5 Units Subcutaneous QHS  . levothyroxine  75 mcg Oral QAC breakfast  . potassium chloride  40 mEq Oral Once  . sildenafil  80 mg Oral TID  . sodium chloride irrigation  200 mL Irrigation Q2H while awake    Infusions: . sodium chloride 10 mL/hr at 08/29/14 0543  . DOBUTamine 2.5 mcg/kg/min (09/05/14 0800)  . heparin 10,000 units/ 20 mL infusion syringe 1,700 Units/hr (09/07/14 0700)  . norepinephrine (LEVOPHED) Adult infusion Stopped (09/05/14 0615)  . dialysis replacement fluid (prismasate) 700 mL/hr at 09/07/14 0552  . dialysis replacement fluid (prismasate) 700 mL/hr at 09/07/14 0556  . dialysate (PRISMASATE) 1,500 mL/hr at 09/07/14 0124  . sodium chloride irrigation      PRN Medications: Place/Maintain arterial line **AND** sodium chloride, albuterol, heparin, heparin, morphine injection, ondansetron (ZOFRAN) IV, oxyCODONE-acetaminophen **AND** oxyCODONE, sodium chloride, sodium chloride, temazepam   Assessment:  1. Left Hip Fracture s/p IM nail on 08/26/14 2. Chronic systolic CHF- Nonischemic cardiomyopathy, on home milrinone 0.5 mcg/kg/min, decreased to 0.25 here with  NSVT.  Became hypotensive, now on dobutamine 2.5.  3. Acute on chronic renal failure - suspect post-op ATN  4.  Paroxysmal atrial fibrillation requiring DCCV 02/2014, 04/2014- on chronic amio and xarelto. CHADS VASc Score- 5.  Remains in NSR.  5. Diabetes mellitus type 2 6. Depression/anxiety 7. OSA, complex- Needs Bipap at night but had difficulty after dental procedure.  8 Pulmonary HTN - Mixed pulmonary venous hypertension and PAH- on Revatio 80 mg tid 9. Dental extractions 08/2014 by Dr Enrique Sack 10. Obesity- Body mass index is 32.39 kg/(m^2). 11. Probable chronic respiratory failure - requiring home O2 with ambulation as of this admission 12. Hyponatremia   13. NSVT 14. Delirium- Improved.   Plan/Discussion:    Now on CVVHD. Suspect post-op ATN, oliguric (47 cc/24 hrs).  CVP remains mildly elevated at 12. Fluid removal at 70 cc/hr, Awaiting renal recovery but still very poor UOP.  Unfortunately, suspect he is going to have to go on HD.  BP now stable, can transition over to intermittent hemodialysis now, he is going to need dobutamine long-term (has been on home milrinone).  Switched over now to dobutamine, think this will be safer than milrinone with intermittent HD.  Co-ox 60%.    Revatio for pulmonary hypertension.   Can decreased amiodarone to 200 mg bid.  Sinus rhythm with PVCs, no atrial fibrillation. Long PR interval noted on EKG. Continue amiodarone. Off Metoprolol XL with soft BP. Off Xarelto due to renal failure. Now on heparin gtt => eventually transition to warfarin. Pharmacy following.   Continue PT/OT.   Length of Stay: Hanna Randal Goens,MD 7:29 AM Advanced Heart Failure Team Pager [phone] (M-F; 7a - 4p)  Please contact [facility] for night-coverage after hours (4p -7a ) and weekends on [email or web address]

## 2014-09-07 NOTE — Progress Notes (Signed)
Subjective: Larry [name] is a 64 yo male with PMHx of CKD stage III who was admitted with a left femoral intertrochanteric fracture and subsequently developed acute on chronic renal failure likely secondary to hypoperfusion. Patient was seen and examined this morning. Patient denies any complaints.   Objective: Filed Vitals:   09/07/14 0600 09/07/14 0800 09/07/14 0900 09/07/14 1000  BP:      Pulse: 60 63 61 61  Temp:  97.3 F (36.3 C)    TempSrc:  Oral    Resp: 9 15 21 17   Height:      Weight:      SpO2: 100% 100% 100% 100%   General: Vital signs reviewed. Patient is in no acute distress and cooperative with exam.  Cardiovascular: RRR, S1 normal, S2 normal, no MRG Pulmonary/Chest: CTA b/l  Abdominal: Soft, non-tender, non-distended, BS +  Extremities: Trace pitting edema Skin: Warm, dry and intact. No rashes or erythema.  Intake/Output from previous day: 07/29 0701 - 07/30 0700 In: 1085.4 [P.O.:860; I.V.:225.4] Out: 3126 [Urine:94] Intake/Output this shift: Total I/O In: 209.4 [P.O.:180; I.V.:29.4] Out: 254 [Other:254]  Lab Results:  Recent Labs  09/06/14 0335 09/07/14 0445  WBC 7.1 7.1  HGB 10.0* 9.3*  HCT 32.0* 30.6*  PLT 235 203   BMET:   Recent Labs  09/06/14 1600 09/07/14 0445  NA 135 132*  K 4.3 4.1  CL 101 100*  CO2 27 26  GLUCOSE 140* 147*  BUN 6 7  CREATININE 1.69* 1.74*  CALCIUM 8.4* 8.3*   CBG (last 3)   Recent Labs  09/06/14 0728 09/06/14 1126 09/06/14 1541  GLUCAP 118* 143* 120*   Studies/Results: No results found. I have reviewed the patient's current medications. Prior to Admission:  Prescriptions prior to admission  Medication Sig Dispense Refill Last Dose  . ALPRAZolam (XANAX) 0.5 MG tablet Take 0.5 mg by mouth See admin instructions. Take 1 tablet (0.5 mg) every night, may take an additional tablet two more times during the day as needed for anxiety   08/24/2014 at Unknown time  . amiodarone (PACERONE) 200 MG tablet Take 1  tablet (200 mg total) by mouth daily. 30 tablet 3 prior to admission 08/21/14  . atorvastatin (LIPITOR) 40 MG tablet TAKE 1 TABLET DAILY 90 tablet 3 prior to admission 08/21/14  . bisacodyl (DULCOLAX) 5 MG EC tablet Take 5 mg by mouth at bedtime.   prior to admission 08/21/14  . chlorhexidine (PERIDEX) 0.12 % solution Perform mouth rinses twice daily after breakfast and at bedtime. (Patient taking differently: Use as directed in the mouth or throat 2 (two) times daily. Perform mouth rinses twice daily after breakfast and at bedtime (swish and spit)) 120 mL 0 08/25/2014 at am  . digoxin (LANOXIN) 0.125 MG tablet Take 0.5 tablets (0.0625 mg total) by mouth daily. 90 tablet 3 prior to admission 08/21/14  . DULoxetine (CYMBALTA) 30 MG capsule Take 60 mg by mouth 2 (two) times daily.    prior to admission 08/21/14  . GLUCAGON EMERGENCY 1 MG injection Inject 1 mg as directed once as needed (hypotension).    not yet needed  . hydrALAZINE (APRESOLINE) 100 MG tablet Take 1 tablet (100 mg total) by mouth 3 (three) times daily. 90 tablet 1 prior to admission 08/21/14  . insulin glargine (LANTUS) 100 unit/mL SOPN Inject 30 Units into the skin at bedtime.   prior to admission 08/21/14  . insulin lispro (HUMALOG KWIKPEN) 100 UNIT/ML KiwkPen Inject 30 Units into the skin 3 (three)  times daily before meals.   prior to admission 08/21/14  . levothyroxine (SYNTHROID, LEVOTHROID) 75 MCG tablet TAKE 1 TABLET ON AN EMPTY STOMACH 30 MINUTES BEFORE BREAKFAST  5 prior to admission 08/21/14  . Melatonin 10 MG TABS Take 10 mg by mouth at bedtime as needed (sleep).    week ago  . metoprolol succinate (TOPROL-XL) 25 MG 24 hr tablet Take 1 tablet (25 mg total) by mouth 2 (two) times daily before a meal. 60 tablet 6 prior to admission 08/21/14 at unknown time  . milrinone (PRIMACOR) 20 MG/100ML SOLN infusion Inject 51.2 mcg/min into the vein continuous. (0.39mcg/kg/min)   08/25/2014 at continuous  . Multiple Vitamin (MULITIVITAMIN WITH  MINERALS) TABS Take 1 tablet by mouth daily.   prior to admission 08/21/14  . oxyCODONE-acetaminophen (PERCOCET) 10-325 MG per tablet Take 1 tablet by mouth every 4 (four) hours as needed for pain.   0 08/25/2014 at 600  . Potassium Chloride ER 20 MEQ TBCR Take 20 mEq by mouth 2 (two) times daily. 60 tablet 0 prior to admission 08/21/14  . rivaroxaban (XARELTO) 15 MG TABS tablet Take 1 tablet (15 mg total) by mouth daily with supper. 30 tablet 3 prior to admission 08/21/14  . sildenafil (REVATIO) 20 MG tablet Take 4 tablets (80 mg total) by mouth 3 (three) times daily. 360 tablet 1 prior to admission 08/21/14  . spironolactone (ALDACTONE) 25 MG tablet TAKE 1 TABLET DAILY 90 tablet 2 prior to admission 08/21/14  . torsemide (DEMADEX) 20 MG tablet Take 3 tablets (60 mg total) by mouth 2 (two) times daily. 180 tablet 1 prior to admission 08/21/14  . zolpidem (AMBIEN) 5 MG tablet Take 1 tablet (5 mg total) by mouth at bedtime as needed for sleep. 30 tablet 0 08/24/2014 at Unknown time  . oxyCODONE-acetaminophen (PERCOCET/ROXICET) 5-325 MG per tablet Take 1-2 tablets by mouth every 4-6 hours as needed for moderate-severe pain. (Patient not taking: Reported on 08/25/2014) 40 tablet 0 Not Taking at Unknown time   Scheduled: . sodium chloride   Intravenous Once  . sodium chloride  10 mL/hr Intravenous Once  . amiodarone  200 mg Oral BID  . atorvastatin  40 mg Oral q1800  . chlorhexidine  15 mL Mouth/Throat BID  . DULoxetine  30 mg Oral Daily  . feeding supplement (NEPRO CARB STEADY)  237 mL Oral BID BM  . insulin aspart  0-9 Units Subcutaneous TID AC & HS  . insulin glargine  5 Units Subcutaneous QHS  . levothyroxine  75 mcg Oral QAC breakfast  . potassium chloride  40 mEq Oral Once  . sildenafil  80 mg Oral TID  . sodium chloride irrigation  200 mL Irrigation Q2H while awake   Continuous: . sodium chloride 10 mL/hr at 08/29/14 0543  . DOBUTamine 2.5 mcg/kg/min (09/07/14 0835)  . heparin 10,000 units/ 20  mL infusion syringe 1,750 Units/hr (09/07/14 1015)  . norepinephrine (LEVOPHED) Adult infusion Stopped (09/05/14 0615)  . dialysis replacement fluid (prismasate) 700 mL/hr at 09/07/14 0552  . dialysis replacement fluid (prismasate) 700 mL/hr at 09/07/14 0556  . dialysate (PRISMASATE) 1,500 mL/hr at 09/07/14 0124  . sodium chloride irrigation     CZY:SAYTK/ZSWFUXNA arterial line **AND** sodium chloride, albuterol, heparin, heparin, morphine injection, ondansetron (ZOFRAN) IV, oxyCODONE-acetaminophen **AND** oxyCODONE, sodium chloride, sodium chloride, temazepam  Assessment/Plan:   Acute on Chronic Kidney Disease: Likely secondary to ATN with hypoperfusion, low FENa. UOP minimally improved with -81 UOP yesterday. Creatinine stable at 1.74 this morning while  on CRRT. We will continue CRRT until tomorrow and then stop so that patient can participate in PT/OT. We will likely start intermittent HD on Monday.  -Repeat renal function panel tomorrow morning -Fluid restrict -Strict I/Os -Continue CRRT until tomorrow morning -Consider intermittent HD for Monday  Hypophosphatemia: 2.4 this morning. -Repeat renal function panel tomorrow am  Hypervolemic Hyponatremia: Likely secondary to acute on chronic kidney failure and CHF. Stable on CRRT. -Repeat RFP tomorrow morning  Normocytic Anemia: Hgb stable. Likely secondary to chronic kidney disease.  -Monitor H/H intermittently   LOS: 13 days   Osa Craver, DO PGY-2 Internal Medicine Resident Pager # [phone] 09/07/2014 10:56 AM

## 2014-09-07 NOTE — Progress Notes (Signed)
ANTICOAGULATION CONSULT NOTE - Follow Up Consult  Pharmacy Consult for Heparin (while Xarelto on hold) Indication: atrial fibrillation  Allergies  Allergen Reactions  . Bydureon [Exenatide] Other (See Comments)    sweating  . Losartan Potassium Other (See Comments)    insomnia    Patient Measurements: Height: 5\' 10"  (177.8 cm) Weight: 210 lb 12.2 oz (95.6 kg) IBW/kg (Calculated) : 73 Heparin Dosing Weight: 96 kg  Vital Signs: Temp: 97.7 F (36.5 C) (07/30 0400) Temp Source: Oral (07/30 0400) Pulse Rate: 60 (07/30 0600)  Labs:  Recent Labs  09/05/14 0415 09/05/14 1030 09/05/14 1037  09/06/14 0335 09/06/14 1600 09/07/14 0445  HGB 10.2*  --   --   --  10.0*  --  9.3*  HCT 31.4*  --   --   --  32.0*  --  30.6*  PLT 254  --   --   --  235  --  203  APTT >200*  --  127*  --  138*  --  151*  HEPARINUNFRC 0.30 0.48  --   --  0.59  --  0.51  CREATININE 1.62*  1.69*  --   --   < > 1.80*  1.69* 1.69* 1.74*  < > = values in this interval not displayed.  Estimated Creatinine Clearance: 51.1 mL/min (by C-G formula based on Cr of 1.74).   Medications:  Heparin on hold  Assessment: 23 YOM admitted 08/25/2014 with L hip fracture after fall on Xarelto PTA for hx Afib. Xarelto held in the setting of AKI requiring CRRT and transitioned to heparin. The patient is noted to have both a PICC line and a RIJ HD catheter.   PMH NICM, CAD, CVA 2007, AICD, hx of pituitary tumor, depression, CKD 3, PAF, DM II, HTN, lupus anticoagulant positive, hypothyroidism.  Anticoagulation/Heme: AFib APTT remains elevated at 151 sec (goal 66-102), HL continues to be therapeutic at 0.51 (goal 0.3-0.7).   Off systemic heparin but has heparin in CRRT.  CBC remains low but stable, no significant s/sx.   Goal of Therapy:  Heparin level 0.3-0.7 units/ml aPTT 66-102 seconds Monitor platelets by anticoagulation protocol: Yes   Plan:  - Continue to hold heparin gtt - F/u AM aPTT/HL  - Monitor for  bleeding - Long term anticoagulation to be switched to warfarin in future   Heloise Ochoa, PharmD  09/07/2014 7:49 AM

## 2014-09-08 DIAGNOSIS — E1122 Type 2 diabetes mellitus with diabetic chronic kidney disease: Secondary | ICD-10-CM

## 2014-09-08 LAB — RENAL FUNCTION PANEL
ALBUMIN: 2.7 g/dL — AB (ref 3.5–5.0)
ANION GAP: 7 (ref 5–15)
BUN: 7 mg/dL (ref 6–20)
CHLORIDE: 99 mmol/L — AB (ref 101–111)
CO2: 27 mmol/L (ref 22–32)
Calcium: 8.5 mg/dL — ABNORMAL LOW (ref 8.9–10.3)
Creatinine, Ser: 1.71 mg/dL — ABNORMAL HIGH (ref 0.61–1.24)
GFR calc Af Amer: 48 mL/min — ABNORMAL LOW (ref >60)
GFR, EST NON AFRICAN AMERICAN: 41 mL/min — AB (ref >60)
Glucose, Bld: 126 mg/dL — ABNORMAL HIGH (ref 65–99)
Phosphorus: 2.2 mg/dL — ABNORMAL LOW (ref 2.5–4.6)
Potassium: 4.4 mmol/L (ref 3.5–5.1)
SODIUM: 133 mmol/L — AB (ref 135–145)

## 2014-09-08 LAB — GLUCOSE, CAPILLARY
GLUCOSE-CAPILLARY: 105 mg/dL — AB (ref 65–99)
Glucose-Capillary: 110 mg/dL — ABNORMAL HIGH (ref 65–99)
Glucose-Capillary: 112 mg/dL — ABNORMAL HIGH (ref 65–99)
Glucose-Capillary: 164 mg/dL — ABNORMAL HIGH (ref 65–99)

## 2014-09-08 LAB — CBC
HCT: 31.1 % — ABNORMAL LOW (ref 39.0–52.0)
HEMOGLOBIN: 9.7 g/dL — AB (ref 13.0–17.0)
MCH: 31.3 pg (ref 26.0–34.0)
MCHC: 31.2 g/dL (ref 30.0–36.0)
MCV: 100.3 fL — ABNORMAL HIGH (ref 78.0–100.0)
PLATELETS: 191 10*3/uL (ref 150–400)
RBC: 3.1 MIL/uL — AB (ref 4.22–5.81)
RDW: 16.2 % — ABNORMAL HIGH (ref 11.5–15.5)
WBC: 7.2 10*3/uL (ref 4.0–10.5)

## 2014-09-08 LAB — CARBOXYHEMOGLOBIN
CARBOXYHEMOGLOBIN: 1.3 % (ref 0.5–1.5)
Methemoglobin: 0.8 % (ref 0.0–1.5)
O2 Saturation: 70.8 %
Total hemoglobin: 9.7 g/dL — ABNORMAL LOW (ref 13.5–18.0)

## 2014-09-08 LAB — POCT ACTIVATED CLOTTING TIME
Activated Clotting Time: 220 seconds
Activated Clotting Time: 227 seconds
Activated Clotting Time: 239 seconds

## 2014-09-08 LAB — APTT
aPTT: >200 seconds (ref 24–37)
aPTT: 40 seconds — ABNORMAL HIGH (ref 24–37)

## 2014-09-08 LAB — MAGNESIUM: Magnesium: 2.3 mg/dL (ref 1.7–2.4)

## 2014-09-08 LAB — HEPARIN LEVEL (UNFRACTIONATED): HEPARIN UNFRACTIONATED: 0.95 [IU]/mL — AB (ref 0.30–0.70)

## 2014-09-08 MED ORDER — HEPARIN (PORCINE) IN NACL 100-0.45 UNIT/ML-% IJ SOLN
1000.0000 [IU]/h | INTRAMUSCULAR | Status: DC
  Administered 2014-09-08: 800 [IU]/h via INTRAVENOUS
  Filled 2014-09-08 (×2): qty 250

## 2014-09-08 NOTE — Progress Notes (Signed)
Subjective: Mr. Mattern is a 64 yo male with PMHx of CKD stage III who was admitted with a left femoral intertrochanteric fracture and subsequently developed acute on chronic renal failure likely secondary to hypoperfusion. Patient was seen and examined this morning. He denies any complaints.   Objective: Filed Vitals:   09/08/14 0700 09/08/14 0800 09/08/14 0900 09/08/14 1000  BP:      Pulse: 59 60 63 67  Temp: 97.3 F (36.3 C)     TempSrc: Oral     Resp: 10 17 12 24   Height:      Weight:      SpO2: 100% 100% 100% 100%   General: Vital signs reviewed. Patient is in no acute distress and cooperative with exam.  Cardiovascular: RRR, S1 normal, S2 normal, no MRG Pulmonary/Chest: CTA b/l  Abdominal: Soft, non-tender, non-distended, BS +  Extremities: No lower extremity edema b/l Skin: Warm, dry and intact. No rashes or erythema.  Intake/Output from previous day: 07/30 0701 - 07/31 0700 In: 775.2 [P.O.:540; I.V.:235.2] Out: 1967 [Urine:18] Intake/Output this shift: Total I/O In: 29.4 [I.V.:29.4] Out: 175 [Urine:5; Other:170]  Lab Results:  Recent Labs  09/07/14 0445 09/08/14 0430  WBC 7.1 7.2  HGB 9.3* 9.7*  HCT 30.6* 31.1*  PLT 203 191   BMET:   Recent Labs  09/07/14 1750 09/08/14 0430  NA 133* 133*  K 4.3 4.4  CL 99* 99*  CO2 28 27  GLUCOSE 129* 126*  BUN 6 7  CREATININE 1.72* 1.71*  CALCIUM 8.5* 8.5*   CBG (last 3)   Recent Labs  09/07/14 1952 09/07/14 2143 09/08/14 0724  GLUCAP 137* 124* 105*   Studies/Results: No results found. I have reviewed the patient's current medications. Prior to Admission:  Prescriptions prior to admission  Medication Sig Dispense Refill Last Dose  . ALPRAZolam (XANAX) 0.5 MG tablet Take 0.5 mg by mouth See admin instructions. Take 1 tablet (0.5 mg) every night, may take an additional tablet two more times during the day as needed for anxiety   08/24/2014 at Unknown time  . amiodarone (PACERONE) 200 MG tablet Take 1  tablet (200 mg total) by mouth daily. 30 tablet 3 prior to admission 08/21/14  . atorvastatin (LIPITOR) 40 MG tablet TAKE 1 TABLET DAILY 90 tablet 3 prior to admission 08/21/14  . bisacodyl (DULCOLAX) 5 MG EC tablet Take 5 mg by mouth at bedtime.   prior to admission 08/21/14  . chlorhexidine (PERIDEX) 0.12 % solution Perform mouth rinses twice daily after breakfast and at bedtime. (Patient taking differently: Use as directed in the mouth or throat 2 (two) times daily. Perform mouth rinses twice daily after breakfast and at bedtime (swish and spit)) 120 mL 0 08/25/2014 at am  . digoxin (LANOXIN) 0.125 MG tablet Take 0.5 tablets (0.0625 mg total) by mouth daily. 90 tablet 3 prior to admission 08/21/14  . DULoxetine (CYMBALTA) 30 MG capsule Take 60 mg by mouth 2 (two) times daily.    prior to admission 08/21/14  . GLUCAGON EMERGENCY 1 MG injection Inject 1 mg as directed once as needed (hypotension).    not yet needed  . hydrALAZINE (APRESOLINE) 100 MG tablet Take 1 tablet (100 mg total) by mouth 3 (three) times daily. 90 tablet 1 prior to admission 08/21/14  . insulin glargine (LANTUS) 100 unit/mL SOPN Inject 30 Units into the skin at bedtime.   prior to admission 08/21/14  . insulin lispro (HUMALOG KWIKPEN) 100 UNIT/ML KiwkPen Inject 30 Units into the skin  3 (three) times daily before meals.   prior to admission 08/21/14  . levothyroxine (SYNTHROID, LEVOTHROID) 75 MCG tablet TAKE 1 TABLET ON AN EMPTY STOMACH 30 MINUTES BEFORE BREAKFAST  5 prior to admission 08/21/14  . Melatonin 10 MG TABS Take 10 mg by mouth at bedtime as needed (sleep).    week ago  . metoprolol succinate (TOPROL-XL) 25 MG 24 hr tablet Take 1 tablet (25 mg total) by mouth 2 (two) times daily before a meal. 60 tablet 6 prior to admission 08/21/14 at unknown time  . milrinone (PRIMACOR) 20 MG/100ML SOLN infusion Inject 51.2 mcg/min into the vein continuous. (0.33mcg/kg/min)   08/25/2014 at continuous  . Multiple Vitamin (MULITIVITAMIN WITH  MINERALS) TABS Take 1 tablet by mouth daily.   prior to admission 08/21/14  . oxyCODONE-acetaminophen (PERCOCET) 10-325 MG per tablet Take 1 tablet by mouth every 4 (four) hours as needed for pain.   0 08/25/2014 at 600  . Potassium Chloride ER 20 MEQ TBCR Take 20 mEq by mouth 2 (two) times daily. 60 tablet 0 prior to admission 08/21/14  . rivaroxaban (XARELTO) 15 MG TABS tablet Take 1 tablet (15 mg total) by mouth daily with supper. 30 tablet 3 prior to admission 08/21/14  . sildenafil (REVATIO) 20 MG tablet Take 4 tablets (80 mg total) by mouth 3 (three) times daily. 360 tablet 1 prior to admission 08/21/14  . spironolactone (ALDACTONE) 25 MG tablet TAKE 1 TABLET DAILY 90 tablet 2 prior to admission 08/21/14  . torsemide (DEMADEX) 20 MG tablet Take 3 tablets (60 mg total) by mouth 2 (two) times daily. 180 tablet 1 prior to admission 08/21/14  . zolpidem (AMBIEN) 5 MG tablet Take 1 tablet (5 mg total) by mouth at bedtime as needed for sleep. 30 tablet 0 08/24/2014 at Unknown time  . oxyCODONE-acetaminophen (PERCOCET/ROXICET) 5-325 MG per tablet Take 1-2 tablets by mouth every 4-6 hours as needed for moderate-severe pain. (Patient not taking: Reported on 08/25/2014) 40 tablet 0 Not Taking at Unknown time   Scheduled: . sodium chloride   Intravenous Once  . sodium chloride  10 mL/hr Intravenous Once  . amiodarone  200 mg Oral BID  . atorvastatin  40 mg Oral q1800  . chlorhexidine  15 mL Mouth/Throat BID  . DULoxetine  30 mg Oral Daily  . feeding supplement (NEPRO CARB STEADY)  237 mL Oral BID BM  . insulin aspart  0-9 Units Subcutaneous TID AC & HS  . insulin glargine  5 Units Subcutaneous QHS  . levothyroxine  75 mcg Oral QAC breakfast  . potassium chloride  40 mEq Oral Once  . sildenafil  80 mg Oral TID  . sodium chloride irrigation  200 mL Irrigation Q2H while awake   Continuous: . sodium chloride 10 mL/hr at 08/29/14 0543  . DOBUTamine 2.5 mcg/kg/min (09/07/14 0835)  . heparin 10,000 units/ 20  mL infusion syringe 1,550 Units/hr (09/08/14 0800)  . norepinephrine (LEVOPHED) Adult infusion Stopped (09/05/14 0615)  . dialysis replacement fluid (prismasate) 700 mL/hr at 09/08/14 0428  . dialysis replacement fluid (prismasate) 700 mL/hr at 09/08/14 0428  . dialysate (PRISMASATE) 1,500 mL/hr at 09/08/14 0445  . sodium chloride irrigation     MEQ:ASTMH/DQQIWLNL arterial line **AND** sodium chloride, albuterol, heparin, heparin, morphine injection, ondansetron (ZOFRAN) IV, oxyCODONE-acetaminophen **AND** oxyCODONE, sodium chloride, sodium chloride, temazepam  Assessment/Plan:   Acute on Chronic Kidney Disease: Likely secondary to ATN with hypoperfusion, low FENa. UOP -18 yesterday. Creatinine stable at 1.71 this morning while on CRRT.  We will discontinue CRRT, allow patient to participate in PT/OT, and likely start intermittent HD on Monday or Tuesday.  -Repeat renal function panel tomorrow morning -Fluid restrict -Strict I/Os -Stop CRRT  -Consider intermittent HD for Monday or Tuesday  Hypophosphatemia: 2.2 this morning. -Monitor -Repeat renal function panel tomorrow am  Hypervolemic Hyponatremia: Likely secondary to acute on chronic kidney failure and CHF. Stable on CRRT. -Repeat RFP tomorrow morning  Normocytic Anemia: Hgb stable. Likely secondary to chronic kidney disease.  -Monitor H/H intermittently   LOS: 14 days   Osa Craver, DO PGY-2 Internal Medicine Resident Pager # [phone] 09/08/2014 10:27 AM

## 2014-09-08 NOTE — Progress Notes (Signed)
ANTICOAGULATION CONSULT NOTE - Follow Up Consult  Pharmacy Consult for Heparin  Indication: atrial fibrillation  Allergies  Allergen Reactions  . Bydureon [Exenatide] Other (See Comments)    sweating  . Losartan Potassium Other (See Comments)    insomnia    Patient Measurements: Height: 5\' 10"  (177.8 cm) Weight: 216 lb 7.9 oz (98.2 kg) IBW/kg (Calculated) : 73 Heparin Dosing Weight: 96 kg  Vital Signs: Temp: 98 F (36.7 C) (07/31 1246) Temp Source: Oral (07/31 1246) Pulse Rate: 69 (07/31 1246)  Labs:  Recent Labs  09/06/14 0335  09/07/14 0445 09/07/14 1750 09/08/14 0430  HGB 10.0*  --  9.3*  --  9.7*  HCT 32.0*  --  30.6*  --  31.1*  PLT 235  --  203  --  191  APTT 138*  --  151*  --  >200*  HEPARINUNFRC 0.59  --  0.51  --  0.95*  CREATININE 1.80*  1.69*  < > 1.74* 1.72* 1.71*  < > = values in this interval not displayed.  Estimated Creatinine Clearance: 52.6 mL/min (by C-G formula based on Cr of 1.71).   Medications:  Medications Prior to Admission  Medication Sig Dispense Refill  . ALPRAZolam (XANAX) 0.5 MG tablet Take 0.5 mg by mouth See admin instructions. Take 1 tablet (0.5 mg) every night, may take an additional tablet two more times during the day as needed for anxiety    . amiodarone (PACERONE) 200 MG tablet Take 1 tablet (200 mg total) by mouth daily. 30 tablet 3  . atorvastatin (LIPITOR) 40 MG tablet TAKE 1 TABLET DAILY 90 tablet 3  . bisacodyl (DULCOLAX) 5 MG EC tablet Take 5 mg by mouth at bedtime.    . chlorhexidine (PERIDEX) 0.12 % solution Perform mouth rinses twice daily after breakfast and at bedtime. (Patient taking differently: Use as directed in the mouth or throat 2 (two) times daily. Perform mouth rinses twice daily after breakfast and at bedtime (swish and spit)) 120 mL 0  . digoxin (LANOXIN) 0.125 MG tablet Take 0.5 tablets (0.0625 mg total) by mouth daily. 90 tablet 3  . DULoxetine (CYMBALTA) 30 MG capsule Take 60 mg by mouth 2 (two)  times daily.     [name] GLUCAGON EMERGENCY 1 MG injection Inject 1 mg as directed once as needed (hypotension).     . hydrALAZINE (APRESOLINE) 100 MG tablet Take 1 tablet (100 mg total) by mouth 3 (three) times daily. 90 tablet 1  . insulin glargine (LANTUS) 100 unit/mL SOPN Inject 30 Units into the skin at bedtime.    . insulin lispro (HUMALOG KWIKPEN) 100 UNIT/ML KiwkPen Inject 30 Units into the skin 3 (three) times daily before meals.    [name] levothyroxine (SYNTHROID, LEVOTHROID) 75 MCG tablet TAKE 1 TABLET ON AN EMPTY STOMACH 30 MINUTES BEFORE BREAKFAST  5  . Melatonin 10 MG TABS Take 10 mg by mouth at bedtime as needed (sleep).     . metoprolol succinate (TOPROL-XL) 25 MG 24 hr tablet Take 1 tablet (25 mg total) by mouth 2 (two) times daily before a meal. 60 tablet 6  . milrinone (PRIMACOR) 20 MG/100ML SOLN infusion Inject 51.2 mcg/min into the vein continuous. (0.35mcg/kg/min)    . Multiple Vitamin (MULITIVITAMIN WITH MINERALS) TABS Take 1 tablet by mouth daily.    [name] oxyCODONE-acetaminophen (PERCOCET) 10-325 MG per tablet Take 1 tablet by mouth every 4 (four) hours as needed for pain.   0  . Potassium Chloride ER 20 MEQ TBCR Take  20 mEq by mouth 2 (two) times daily. 60 tablet 0  . rivaroxaban (XARELTO) 15 MG TABS tablet Take 1 tablet (15 mg total) by mouth daily with supper. 30 tablet 3  . sildenafil (REVATIO) 20 MG tablet Take 4 tablets (80 mg total) by mouth 3 (three) times daily. 360 tablet 1  . spironolactone (ALDACTONE) 25 MG tablet TAKE 1 TABLET DAILY 90 tablet 2  . torsemide (DEMADEX) 20 MG tablet Take 3 tablets (60 mg total) by mouth 2 (two) times daily. 180 tablet 1  . zolpidem (AMBIEN) 5 MG tablet Take 1 tablet (5 mg total) by mouth at bedtime as needed for sleep. 30 tablet 0  . oxyCODONE-acetaminophen (PERCOCET/ROXICET) 5-325 MG per tablet Take 1-2 tablets by mouth every 4-6 hours as needed for moderate-severe pain. (Patient not taking: Reported on 08/25/2014) 40 tablet 0     Assessment: 72 YOM admitted 08/25/2014 with L hip fracture after fall on Xarelto PTA for hx Afib. The patient is noted to have both a PICC line and a RIJ HD catheter.   PMH NICM, CAD, CVA 2007, AICD, hx of pituitary tumor, depression, CKD 3, PAF, DM II, HTN, lupus anticoagulant positive, hypothyroidism.  Anticoagulation/Heme: AFib Home Xarelto switched to heparin  Previously on gtt, however stopped d/t pt being anticoagulated with heparin from CRRT HL 0.91 aPTT >200, levels supratherapeutic Per RN, had increasing CVVHD rates yesterday which could have increased heparin rate CBC low but stable  CRRT stopped 7/31 AM, transitioning to IHD tomorrow  Goal of Therapy:  Heparin level 0.3-0.7 units/ml aPTT 66-102 seconds Monitor platelets by anticoagulation protocol: Yes   Plan:  - 1500 aPTT/HL  - Monitor for bleeding - Long term anticoagulation to be switched to warfarin in future   Heloise Ochoa, PharmD  09/08/2014 1:10 PM

## 2014-09-08 NOTE — Progress Notes (Signed)
[facility] PROGRESS NOTE  Larry [name]:[id number] DOB: July 21, 1950 DOA: 08/25/2014 PCP: [name], MD   Assessment/Plan:  1. Severe nonischemic cardiomyopathy with chronic systolic heart failure with an EF of 25%. History of V. tach. Patient has AICD, on home milrinone drip. Experienced hypotension requiring norepinephrine and now on dobutamine; plan is to continue dobutamine instead of milrinone. -Patient was started on CVVHD on 08/31/2014; will stop CVVH Today and following rec's from renal service transition to intermittent HD. -Cardiology managing HF meds -He is tolerating CVVHD; but has remained oliguric and most likely will need permanent HD -Patient having urinary output in the 70-80 mL range daily.  2.  Acute on chronic renal failure -Labs showing ongoing downward trend in his creatinine -Xarelto was discontinued and started on IV heparin -Nephrology was consulted, feeling renal failure likely represents postoperative ATN/hypoperfusion from hypotension -CVVH was started on 08/31/2014; plan is to stop CVVH today (7/31) -A.m. labs on 09/04/2014 showing creatinine of 1.6 -still oliguric -Nephrology managing  -most likely will ended on permanent HD  3. Constipation -Patient w/o diarrhea; reports BM are ok now.  4.  Hyponatremia -I suspect secondary to volume overload and renal failure -Improved after starting CRT, sodium stable now  5. Mechanical trip and fall with left femoral intertrochanteric fracture.  -status post intramedullary nail to left hip on 7/19 -WBAT -PT/OT evaluation -Plan for discharge to SNF when medically stable  6. Paroxysmal atrial fibrillation and pulmonary HTN.  -Xarelto discontinued due to worsening kidney function, now on IV heparin and with plans for coumadin eventually -On Amio continue revatio for pulm HTN  7. Obstructive sleep apnea.  -Patient refusing BiPAP  8. DM type II. recent A1c of 7.7 -lantus changed to 5 units  along with SSI since 7/28, he had hypoglycemia on 7/27 -follow CBG's and adjust as needed  9. Insomnia: -will continue using restoril PRN   Code Status: Full Family Communication: wife at bedside Disposition Plan: Patient will remain on this unit for now. Plan is to switch off CVVH today (7/31)   Consultants:  Ortho (Dr. Percell Miller)  HF team   Nephrology  Procedures:  S/P intramedullary nail on left hip on 7/18  CVVH   Antibiotics:  None   HPI/Subjective: Patient is doing ok overall. Still with poor urine output. No fever, no CP. Cr in the 1.6-1.7; receiving CVVH   Objective: Filed Vitals:   09/08/14 0700  BP:   Pulse: 59  Temp: 97.3 F (36.3 C)  Resp: 10    Intake/Output Summary (Last 24 hours) at 09/08/14 0810 Last data filed at 09/08/14 0800  Gross per 24 hour  Intake  765.4 ml  Output   1974 ml  Net -1208.6 ml   Filed Weights   09/06/14 0500 09/07/14 0500 09/08/14 0419  Weight: 99.1 kg (218 lb 7.6 oz) 95.6 kg (210 lb 12.2 oz) 98.2 kg (216 lb 7.9 oz)    Exam:   General:  He is awake and alert, oriented X 3, follows commands, chronically ill-appearing; O2 supplementation on board through [place]. Patient having good night sleep with use of restoril. VSS  Cardiovascular:no rubs, no gallops, RRR  Respiratory: Normal respiratory effort, lungs are clear to auscultation bilaterally  Abdomen: soft, NT, ND, positive Bowel sounds  Musculoskeletal: Surgical incision site appears clean, no evidence of infection. Still with pedal edema and complaining of pain on his left knee cap and left proximal tight    Data Reviewed: Basic Metabolic Panel:  Recent Labs Lab  09/04/14 0413  09/05/14 0415  09/06/14 0335 09/06/14 1600 09/07/14 0445 09/07/14 1750 09/08/14 0430  NA 131*  < > 132*  132*  < > 132*  132* 135 132* 133* 133*  K 3.3*  < > 4.0  4.0  < > 4.3  4.3 4.3 4.1 4.3 4.4  CL 99*  < > 99*  98*  < > 99*  101 101 100* 99* 99*  CO2 27  < > 27  26  < >  26  27 27 26 28 27   GLUCOSE 119*  < > 120*  120*  < > 125*  124* 140* 147* 129* 126*  BUN 6  < > <5*  5*  < > 6  6 6 7 6 7   CREATININE 1.47*  < > 1.62*  1.69*  < > 1.80*  1.69* 1.69* 1.74* 1.72* 1.71*  CALCIUM 8.1*  < > 8.0*  8.0*  < > 8.3*  8.3* 8.4* 8.3* 8.5* 8.5*  MG 2.3  --  2.2  --  2.4  --  2.3  --  2.3  PHOS  --   < > 2.3*  < > 2.9 2.5 2.4* 2.4* 2.2*  < > = values in this interval not displayed. CBC:  Recent Labs Lab 09/04/14 0413 09/05/14 0415 09/06/14 0335 09/07/14 0445 09/08/14 0430  WBC 8.9 9.5 7.1 7.1 7.2  HGB 10.0* 10.2* 10.0* 9.3* 9.7*  HCT 31.7* 31.4* 32.0* 30.6* 31.1*  MCV 98.8 98.4 99.4 99.0 100.3*  PLT 281 254 235 203 191   BNP (last 3 results)  Recent Labs  05/22/14 1029 05/27/14 1126 06/19/14 1030  BNP 935.2* 1627.8* 738.9*    ProBNP (last 3 results)  Recent Labs  10/09/13 0926 11/14/13 0926 01/14/14 1112  PROBNP 1561.0* 1261.0* 5769.0*   CBG:  Recent Labs Lab 09/07/14 0844 09/07/14 1214 09/07/14 1725 09/07/14 1952 09/07/14 2143  GLUCAP 124* 140* 120* 137* 124*    Recent Results (from the past 240 hour(s))  Clostridium Difficile by PCR (not at [facility])     Status: None   Collection Time: 09/02/14 10:27 PM  Result Value Ref Range Status   C difficile by pcr NEGATIVE NEGATIVE Final     Studies: No results found.  Scheduled Meds: . sodium chloride   Intravenous Once  . sodium chloride  10 mL/hr Intravenous Once  . amiodarone  200 mg Oral BID  . atorvastatin  40 mg Oral q1800  . chlorhexidine  15 mL Mouth/Throat BID  . DULoxetine  30 mg Oral Daily  . feeding supplement (NEPRO CARB STEADY)  237 mL Oral BID BM  . insulin aspart  0-9 Units Subcutaneous TID AC & HS  . insulin glargine  5 Units Subcutaneous QHS  . levothyroxine  75 mcg Oral QAC breakfast  . potassium chloride  40 mEq Oral Once  . sildenafil  80 mg Oral TID  . sodium chloride irrigation  200 mL Irrigation Q2H while awake   Continuous Infusions: . sodium  chloride 10 mL/hr at 08/29/14 0543  . DOBUTamine 2.5 mcg/kg/min (09/07/14 0835)  . heparin 10,000 units/ 20 mL infusion syringe 1,600 Units/hr (09/08/14 0419)  . norepinephrine (LEVOPHED) Adult infusion Stopped (09/05/14 0615)  . dialysis replacement fluid (prismasate) 700 mL/hr at 09/08/14 0428  . dialysis replacement fluid (prismasate) 700 mL/hr at 09/08/14 0428  . dialysate (PRISMASATE) 1,500 mL/hr at 09/08/14 0445  . sodium chloride irrigation      Principal Problem:   Fracture, intertrochanteric, left femur  Active Problems:   Essential hypertension, benign   Persistent atrial fibrillation   Automatic implantable cardioverter-defibrillator in situ   DM2 (diabetes mellitus, type 2)   OSA (obstructive sleep apnea), intolerant to CPAP/BIPAP   Ventricular tachycardia   Chronic systolic CHF (congestive heart failure)   Hip fracture   Left femoral shaft fracture   Acute on chronic renal failure   Hyponatremia   Acute on chronic systolic CHF (congestive heart failure)   Acute renal failure superimposed on stage 4 chronic kidney disease   SOB (shortness of breath)   Insomnia   Time spent: 30 minutes   Barton Dubois  [facility] Pager [phone]. If 7PM-7AM, please contact night-coverage at [email or web address], password [facility] 09/08/2014, 8:10 AM  LOS: 14 days

## 2014-09-08 NOTE — Progress Notes (Signed)
Pt refuses BiPAP RN aware

## 2014-09-08 NOTE — Progress Notes (Signed)
Patient ID: Larry [name], male   DOB: 1950-02-23, 64 y.o.   MRN: [id number] Advanced Heart Failure Rounding Note   Subjective:    Larry [name] is a 64 y/o man with history of chronic systolic CHF (nonischemic cardiomyopathy) s/p 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 He is on chronic milrinone 0.5 mcg.  Prior to admit he was being considered for LVAD.   Dischaged 7/16 after dental procedures. Also had RHC with elevate pulmonary pressures. Revatio was increased to 80 mg tid during that stay.  Admitted with fall---> broken L  hip . 7/18 had IM Nail L femur.  7/23 started CVVHD.   Hypotensive 7/28, stopped milrinone and started norepinephrine.  Pressure recovered, now off norepinephrine and on dobutamine 2.5 mcg/kg/min.  BP stable today.  UF at 70 cc/hr on CVVH.  He is awake and alert. CVP 12, co-ox 71%.  Still very poor UOP.    Objective:   Weight Range:  Vital Signs:   Temp:  [97.3 F (36.3 C)-97.8 F (36.6 C)] 97.3 F (36.3 C) (07/31 0700) Pulse Rate:  [58-63] 59 (07/31 0700) Resp:  [10-21] 10 (07/31 0700) SpO2:  [98 %-100 %] 100 % (07/31 0700) Arterial Line BP: (79-126)/(37-58) 122/54 mmHg (07/31 0700) Weight:  [216 lb 7.9 oz (98.2 kg)] 216 lb 7.9 oz (98.2 kg) (07/31 0419) Last BM Date: 09/07/14  Weight change: Filed Weights   09/06/14 0500 09/07/14 0500 09/08/14 0419  Weight: 218 lb 7.6 oz (99.1 kg) 210 lb 12.2 oz (95.6 kg) 216 lb 7.9 oz (98.2 kg)    Intake/Output:   Intake/Output Summary (Last 24 hours) at 09/08/14 0729 Last data filed at 09/08/14 0700  Gross per 24 hour  Intake  775.2 ml  Output   1967 ml  Net -1191.8 ml     Physical Exam: General:  NAD  HEENT: normal Neck: supple. JVP 10 cm. RIJ trialysis catheter.  Carotids 2+ bilat; no bruits. No lymphadenopathy or thryomegaly appreciated. Cor: PMI nondisplaced. Regular rate & rhythm. No rubs, gallops or murmurs. Lungs:  Diminished both bases with mild crackles Abdomen: soft, nontender, nondistended. No hepatosplenomegaly. No bruits or masses. Good bowel sounds. Extremities: no cyanosis, clubbing, rash. RUE PICC dual lumen . R Thigh lateral aspect dressing intact x2. 1+ ankle edema. Neuro: Awake, alert.   Telemetry: NSR with PVCs  Labs: Basic Metabolic Panel:  Recent Labs Lab 09/04/14 0413  09/05/14 0415  09/06/14 0335 09/06/14 1600 09/07/14 0445 09/07/14 1750 09/08/14 0430  NA 131*  < > 132*  132*  < > 132*  132* 135 132* 133* 133*  K 3.3*  < > 4.0  4.0  < > 4.3  4.3 4.3 4.1 4.3 4.4  CL 99*  < > 99*  98*  < > 99*  101 101 100* 99* 99*  CO2 27  < > 27  26  < > 26  27 27 26 28 27   GLUCOSE 119*  < > 120*  120*  < > 125*  124* 140* 147* 129* 126*  BUN 6  < > <5*  5*  < > 6  6 6 7 6 7   CREATININE 1.47*  < > 1.62*  1.69*  < > 1.80*  1.69* 1.69* 1.74* 1.72* 1.71*  CALCIUM 8.1*  < > 8.0*  8.0*  < > 8.3*  8.3* 8.4* 8.3* 8.5* 8.5*  MG 2.3  --  2.2  --  2.4  --  2.3  --  2.3  PHOS  --   < > 2.3*  < > 2.9 2.5 2.4* 2.4* 2.2*  < > = values in this interval not displayed.  Liver Function Tests:  Recent Labs Lab 09/04/14 0413  09/06/14 0335 09/06/14 1600 09/07/14 0445 09/07/14 1750 09/08/14 0430  AST 26  --   --   --  31  --   --   ALT 10*  --   --   --  12*  --   --   ALKPHOS 68  --   --   --  79  --   --   BILITOT 1.1  --   --   --  0.8  --   --   PROT 6.7  --   --   --  6.7  --   --   ALBUMIN 2.7*  < > 2.7* 2.7* 2.8* 2.7* 2.7*  < > = values in this interval not displayed. No results for input(s): LIPASE, AMYLASE in the last 168 hours. No results for input(s): AMMONIA in the last 168 hours.  CBC:  Recent Labs Lab 09/04/14 0413 09/05/14 0415 09/06/14 0335 09/07/14 0445 09/08/14 0430  WBC 8.9 9.5 7.1 7.1 7.2  HGB 10.0* 10.2* 10.0* 9.3* 9.7*  HCT 31.7* 31.4* 32.0* 30.6* 31.1*  MCV 98.8 98.4 99.4 99.0 100.3*  PLT 281 254 235 203 191    Cardiac Enzymes: No results for  input(s): CKTOTAL, CKMB, CKMBINDEX, TROPONINI in the last 168 hours.  BNP: BNP (last 3 results)  Recent Labs  05/22/14 1029 05/27/14 1126 06/19/14 1030  BNP 935.2* 1627.8* 738.9*    ProBNP (last 3 results)  Recent Labs  10/09/13 0926 11/14/13 0926 01/14/14 1112  PROBNP 1561.0* 1261.0* 5769.0*      Other results:  Imaging: No results found.   Medications:     Scheduled Medications: . sodium chloride   Intravenous Once  . sodium chloride  10 mL/hr Intravenous Once  . amiodarone  200 mg Oral BID  . atorvastatin  40 mg Oral q1800  . chlorhexidine  15 mL Mouth/Throat BID  . DULoxetine  30 mg Oral Daily  . feeding supplement (NEPRO CARB STEADY)  237 mL Oral BID BM  . insulin aspart  0-9 Units Subcutaneous TID AC & HS  . insulin glargine  5 Units Subcutaneous QHS  . levothyroxine  75 mcg Oral QAC breakfast  . potassium chloride  40 mEq Oral Once  . sildenafil  80 mg Oral TID  . sodium chloride irrigation  200 mL Irrigation Q2H while awake    Infusions: . sodium chloride 10 mL/hr at 08/29/14 0543  . DOBUTamine 2.5 mcg/kg/min (09/07/14 0835)  . heparin 10,000 units/ 20 mL infusion syringe 1,600 Units/hr (09/08/14 0419)  . norepinephrine (LEVOPHED) Adult infusion Stopped (09/05/14 0615)  . dialysis replacement fluid (prismasate) 700 mL/hr at 09/08/14 0428  . dialysis replacement fluid (prismasate) 700 mL/hr at 09/08/14 0428  . dialysate (PRISMASATE) 1,500 mL/hr at 09/08/14 0445  . sodium chloride irrigation      PRN Medications: Place/Maintain arterial line **AND** sodium chloride, albuterol, heparin, heparin, morphine injection, ondansetron (ZOFRAN) IV, oxyCODONE-acetaminophen **AND** oxyCODONE, sodium chloride, sodium chloride, temazepam   Assessment:  1. Left Hip Fracture s/p IM nail on 08/26/14 2. Chronic systolic CHF- Nonischemic cardiomyopathy, on home milrinone 0.5 mcg/kg/min, decreased to 0.25 here with NSVT. Became hypotensive, now on dobutamine 2.5.   3. Acute on chronic renal failure - suspect  post-op ATN  4.  Paroxysmal atrial fibrillation requiring DCCV 02/2014, 04/2014- on chronic amio and xarelto. CHADS VASc Score- 5.  Remains in NSR.  5. Diabetes mellitus type 2 6. Depression/anxiety 7. OSA, complex- Needs Bipap at night but had difficulty after dental procedure.  8 Pulmonary HTN - Mixed pulmonary venous hypertension and PAH- on Revatio 80 mg tid 9. Dental extractions 08/2014 by Dr Enrique Sack 10. Obesity- Body mass index is 32.39 kg/(m^2). 11. Probable chronic respiratory failure - requiring home O2 with ambulation as of this admission 12. Hyponatremia   13. NSVT 14. Delirium- Improved.   Plan/Discussion:    Now on CVVHD. Suspect post-op ATN, oliguric (47 cc/24 hrs).  CVP remains mildly elevated at 12. Fluid removal at 70 cc/hr, Awaiting renal recovery but still very poor UOP.  Unfortunately, suspect he is going to have to go on HD.  BP now stable, can transition over to intermittent hemodialysis now (plan to stop CVVH today, HD probably tomorrow), he is going to need dobutamine long-term (has been on home milrinone).  Switched over now to dobutamine, think this will be safer than milrinone with intermittent HD.  Co-ox 71%.    Revatio for pulmonary hypertension.   Continue amiodarone 200 mg bid.  Sinus rhythm with occasional PVCs, no atrial fibrillation. Long PR interval noted on EKG. Continue amiodarone. Off Metoprolol XL with soft BP. Off Xarelto due to renal failure. Now on heparin gtt => eventually transition to warfarin. Pharmacy following.   Continue PT/OT.   Length of Stay: Calumet McLean,MD 7:29 AM Advanced Heart Failure Team Pager [phone] (M-F; 7a - 4p)  Please contact [facility] for night-coverage after hours (4p -7a ) and weekends on [email or web address]

## 2014-09-08 NOTE — Progress Notes (Signed)
ANTICOAGULATION CONSULT NOTE - Follow Up Consult  Pharmacy Consult for Heparin  Indication: atrial fibrillation  Allergies  Allergen Reactions  . Bydureon [Exenatide] Other (See Comments)    sweating  . Losartan Potassium Other (See Comments)    insomnia    Patient Measurements: Height: 5\' 10"  (177.8 cm) Weight: 216 lb 7.9 oz (98.2 kg) IBW/kg (Calculated) : 73 Heparin Dosing Weight: 96 kg  Vital Signs: Temp: 98 F (36.7 C) (07/31 1246) Temp Source: Oral (07/31 1246) Pulse Rate: 71 (07/31 1530)  Labs:  Recent Labs  09/06/14 0335  09/07/14 0445 09/07/14 1750 09/08/14 0430 09/08/14 1525  HGB 10.0*  --  9.3*  --  9.7*  --   HCT 32.0*  --  30.6*  --  31.1*  --   PLT 235  --  203  --  191  --   APTT 138*  --  151*  --  >200* 40*  HEPARINUNFRC 0.59  --  0.51  --  0.95*  --   CREATININE 1.80*  1.69*  < > 1.74* 1.72* 1.71*  --   < > = values in this interval not displayed.  Estimated Creatinine Clearance: 52.6 mL/min (by C-G formula based on Cr of 1.71).     Assessment: 51 YOM admitted 08/25/2014 with L hip fracture after fall on Xarelto PTA for hx Afib. The patient is noted to have both a PICC line and a RIJ HD catheter.   PMH NICM, CAD, CVA 2007, AICD, hx of pituitary tumor, depression, CKD 3, PAF, DM II, HTN, lupus anticoagulant positive, hypothyroidism.  Anticoagulation/Heme: AFib Home Xarelto switched to heparin  Previously on gtt, however stopped d/t pt being anticoagulated with heparin from CRRT Per RN, had increasing CVVHD rates yesterday which could have increased heparin rate CBC low but stable  CRRT stopped 7/31 AM > PTT now back down to 40 off of heparin with CRRT and off systemic heparin.  Will need to resume now.  Goal of Therapy:  Heparin level 0.3-0.7 units/ml aPTT 66-102 seconds Monitor platelets by anticoagulation protocol: Yes   Plan:  - Restart IV heparin at rate of 800 units/hr. - Check PTT 8 hrs after heparin resumed. - Continue daily  PTT and heparin level until correlating.  Uvaldo Rising, BCPS  Clinical Pharmacist Pager [phone]  09/08/2014 4:13 PM

## 2014-09-09 ENCOUNTER — Telehealth: Payer: Self-pay | Admitting: Pulmonary Disease

## 2014-09-09 DIAGNOSIS — Z9289 Personal history of other medical treatment: Secondary | ICD-10-CM

## 2014-09-09 DIAGNOSIS — R34 Anuria and oliguria: Secondary | ICD-10-CM | POA: Insufficient documentation

## 2014-09-09 DIAGNOSIS — S72002D Fracture of unspecified part of neck of left femur, subsequent encounter for closed fracture with routine healing: Secondary | ICD-10-CM

## 2014-09-09 HISTORY — DX: Personal history of other medical treatment: Z92.89

## 2014-09-09 HISTORY — PX: AV FISTULA PLACEMENT: SHX1204

## 2014-09-09 LAB — RENAL FUNCTION PANEL
Albumin: 2.7 g/dL — ABNORMAL LOW (ref 3.5–5.0)
Anion gap: 6 (ref 5–15)
BUN: 20 mg/dL (ref 6–20)
CO2: 24 mmol/L (ref 22–32)
CREATININE: 3.18 mg/dL — AB (ref 0.61–1.24)
Calcium: 8.9 mg/dL (ref 8.9–10.3)
Chloride: 98 mmol/L — ABNORMAL LOW (ref 101–111)
GFR calc Af Amer: 23 mL/min — ABNORMAL LOW (ref >60)
GFR calc non Af Amer: 19 mL/min — ABNORMAL LOW (ref >60)
Glucose, Bld: 135 mg/dL — ABNORMAL HIGH (ref 65–99)
Phosphorus: 3.3 mg/dL (ref 2.5–4.6)
Potassium: 4.7 mmol/L (ref 3.5–5.1)
Sodium: 128 mmol/L — ABNORMAL LOW (ref 135–145)

## 2014-09-09 LAB — GLUCOSE, CAPILLARY
GLUCOSE-CAPILLARY: 122 mg/dL — AB (ref 65–99)
GLUCOSE-CAPILLARY: 110 mg/dL — AB (ref 65–99)
Glucose-Capillary: >600 mg/dL (ref 65–99)
Glucose-Capillary: 109 mg/dL — ABNORMAL HIGH (ref 65–99)

## 2014-09-09 LAB — CBC
HCT: 28.8 % — ABNORMAL LOW (ref 39.0–52.0)
Hemoglobin: 9.3 g/dL — ABNORMAL LOW (ref 13.0–17.0)
MCH: 31.5 pg (ref 26.0–34.0)
MCHC: 32.3 g/dL (ref 30.0–36.0)
MCV: 97.6 fL (ref 78.0–100.0)
PLATELETS: 216 10*3/uL (ref 150–400)
RBC: 2.95 MIL/uL — ABNORMAL LOW (ref 4.22–5.81)
RDW: 16.2 % — ABNORMAL HIGH (ref 11.5–15.5)
WBC: 11.8 10*3/uL — ABNORMAL HIGH (ref 4.0–10.5)

## 2014-09-09 LAB — PROTIME-INR
INR: 1.22 (ref 0.00–1.49)
PROTHROMBIN TIME: 15.6 s — AB (ref 11.6–15.2)

## 2014-09-09 LAB — MAGNESIUM: Magnesium: 2.3 mg/dL (ref 1.7–2.4)

## 2014-09-09 LAB — HEPARIN LEVEL (UNFRACTIONATED)
HEPARIN UNFRACTIONATED: 0.1 [IU]/mL — AB (ref 0.30–0.70)
Heparin Unfractionated: <0.1 IU/mL — ABNORMAL LOW (ref 0.30–0.70)

## 2014-09-09 LAB — CARBOXYHEMOGLOBIN
Carboxyhemoglobin: 2 % — ABNORMAL HIGH (ref 0.5–1.5)
Methemoglobin: 1.2 % (ref 0.0–1.5)
O2 Saturation: 91.1 %
Total hemoglobin: 9.2 g/dL — ABNORMAL LOW (ref 13.5–18.0)

## 2014-09-09 LAB — APTT: aPTT: 50 seconds — ABNORMAL HIGH (ref 24–37)

## 2014-09-09 MED ORDER — COUMADIN BOOK
Freq: Once | Status: AC
  Administered 2014-09-09: 14:00:00
  Filled 2014-09-09: qty 1

## 2014-09-09 MED ORDER — HEPARIN (PORCINE) IN NACL 100-0.45 UNIT/ML-% IJ SOLN
1900.0000 [IU]/h | INTRAMUSCULAR | Status: DC
  Administered 2014-09-09: 1300 [IU]/h via INTRAVENOUS
  Administered 2014-09-10: 1600 [IU]/h via INTRAVENOUS
  Administered 2014-09-11: 1900 [IU]/h via INTRAVENOUS
  Filled 2014-09-09 (×7): qty 250

## 2014-09-09 MED ORDER — WARFARIN - PHARMACIST DOSING INPATIENT
Freq: Every day | Status: DC
  Administered 2014-09-09 – 2014-09-10 (×2)

## 2014-09-09 MED ORDER — SODIUM CHLORIDE 0.9 % IV SOLN: 100.0000 mL | INTRAVENOUS | Status: DC | PRN

## 2014-09-09 MED ORDER — WARFARIN SODIUM 7.5 MG PO TABS
7.5000 mg | ORAL_TABLET | Freq: Once | ORAL | Status: AC
  Administered 2014-09-09: 7.5 mg via ORAL
  Filled 2014-09-09: qty 1

## 2014-09-09 MED ORDER — WARFARIN VIDEO
Freq: Once | Status: AC
  Administered 2014-09-09: 14:00:00

## 2014-09-09 MED ORDER — PENTAFLUOROPROP-TETRAFLUOROETH EX AERO: 1.0000 "application " | INHALATION_SPRAY | CUTANEOUS | Status: DC | PRN

## 2014-09-09 MED ORDER — LIDOCAINE-PRILOCAINE 2.5-2.5 % EX CREA
1.0000 "application " | TOPICAL_CREAM | CUTANEOUS | Status: DC | PRN
  Filled 2014-09-09: qty 5

## 2014-09-09 MED ORDER — HEPARIN SODIUM (PORCINE) 1000 UNIT/ML DIALYSIS
1000.0000 [IU] | INTRAMUSCULAR | Status: DC | PRN
  Filled 2014-09-09: qty 1

## 2014-09-09 MED ORDER — DARBEPOETIN ALFA 100 MCG/0.5ML IJ SOSY
100.0000 ug | PREFILLED_SYRINGE | INTRAMUSCULAR | Status: DC
  Administered 2014-09-09 – 2014-09-30 (×4): 100 ug via INTRAVENOUS
  Filled 2014-09-09 (×6): qty 0.5

## 2014-09-09 MED ORDER — ALTEPLASE 2 MG IJ SOLR
2.0000 mg | Freq: Once | INTRAMUSCULAR | Status: DC | PRN
  Filled 2014-09-09: qty 2

## 2014-09-09 MED ORDER — DARBEPOETIN ALFA 100 MCG/0.5ML IJ SOSY
PREFILLED_SYRINGE | INTRAMUSCULAR | Status: AC
  Administered 2014-09-09: 100 ug
  Filled 2014-09-09: qty 0.5

## 2014-09-09 MED ORDER — HEPARIN BOLUS VIA INFUSION
3000.0000 [IU] | Freq: Once | INTRAVENOUS | Status: AC
  Administered 2014-09-09: 3000 [IU] via INTRAVENOUS
  Filled 2014-09-09: qty 3000

## 2014-09-09 MED ORDER — ALPRAZOLAM 0.25 MG PO TABS
0.2500 mg | ORAL_TABLET | Freq: Two times a day (BID) | ORAL | Status: DC | PRN
  Administered 2014-09-09 – 2014-09-23 (×10): 0.25 mg via ORAL
  Administered 2014-09-23: 0.5 mg via ORAL
  Administered 2014-09-24 – 2014-09-30 (×9): 0.25 mg via ORAL
  Filled 2014-09-09 (×21): qty 1

## 2014-09-09 MED ORDER — LIDOCAINE HCL (PF) 1 % IJ SOLN: 5.0000 mL | INTRAMUSCULAR | Status: DC | PRN

## 2014-09-09 MED ORDER — NEPRO/CARBSTEADY PO LIQD: 237.0000 mL | ORAL | Status: DC | PRN

## 2014-09-09 NOTE — Progress Notes (Signed)
Pt a-line dressing was changed. Clean with chloropeptic, and secure with dressing. Pt has good reading off the a-line still. RN aware

## 2014-09-09 NOTE — Progress Notes (Signed)
Patient ID: Larry [name], male   DOB: 23-Oct-1950, 64 y.o.   MRN: [id number] Advanced Heart Failure Rounding Note   Subjective:    [name] is a 64 y/o man with history of chronic systolic CHF (nonischemic cardiomyopathy) s/p [employer] 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 He is on chronic milrinone 0.5 mcg.  Prior to admit he was being considered for LVAD.   Dischaged 7/16 after dental procedures. Also had RHC with elevate pulmonary pressures. Revatio was increased to 80 mg tid during that stay.  Admitted with fall---> broken L  hip . 7/18 had IM Nail L femur.  7/23 started CVVHD.   Hypotensive 7/28, stopped milrinone and started norepinephrine.  Pressure recovered, now off norepinephrine and on dobutamine 2.5 mcg/kg/min.  BP stable today.  CVVH stopped yesterday.  125 cc UOP.  Sleeping this morning. CVP 10-12, co-ox not accurate.     Objective:   Weight Range:  Vital Signs:   Temp:  [98 F (36.7 C)-99.4 F (37.4 C)] 99.2 F (37.3 C) (08/01 0722) Pulse Rate:  [60-118] 70 (08/01 0722) Resp:  [12-34] 15 (08/01 0722) SpO2:  [90 %-100 %] 100 % (08/01 0722) Arterial Line BP: (86-137)/(38-65) 128/48 mmHg (08/01 0600) Last BM Date: 09/07/14  Weight change: Filed Weights   09/06/14 0500 09/07/14 0500 09/08/14 0419  Weight: 218 lb 7.6 oz (99.1 kg) 210 lb 12.2 oz (95.6 kg) 216 lb 7.9 oz (98.2 kg)    Intake/Output:   Intake/Output Summary (Last 24 hours) at 09/09/14 0729 Last data filed at 09/09/14 0700  Gross per 24 hour  Intake 792.97 ml  Output    295 ml  Net 497.97 ml     Physical Exam: General:  NAD  HEENT: normal Neck: supple. JVP 10 cm. RIJ trialysis catheter.  Carotids 2+ bilat; no bruits. No lymphadenopathy or thryomegaly appreciated. Cor: PMI nondisplaced. Regular rate & rhythm. No rubs, gallops or murmurs. Lungs: Diminished both bases with mild crackles Abdomen: soft, nontender,  nondistended. No hepatosplenomegaly. No bruits or masses. Good bowel sounds. Extremities: no cyanosis, clubbing, rash. RUE PICC dual lumen . R Thigh lateral aspect dressing intact x2. 1+ ankle edema. Neuro: sleepy but awakens  Telemetry: NSR   Labs: Basic Metabolic Panel:  Recent Labs Lab 09/05/14 0415  09/06/14 0335 09/06/14 1600 09/07/14 0445 09/07/14 1750 09/08/14 0430 09/09/14 0400  NA 132*  132*  < > 132*  132* 135 132* 133* 133* 128*  K 4.0  4.0  < > 4.3  4.3 4.3 4.1 4.3 4.4 4.7  CL 99*  98*  < > 99*  101 101 100* 99* 99* 98*  CO2 27  26  < > 26  27 27 26 28 27 24   GLUCOSE 120*  120*  < > 125*  124* 140* 147* 129* 126* 135*  BUN <5*  5*  < > 6  6 6 7 6 7 20   CREATININE 1.62*  1.69*  < > 1.80*  1.69* 1.69* 1.74* 1.72* 1.71* 3.18*  CALCIUM 8.0*  8.0*  < > 8.3*  8.3* 8.4* 8.3* 8.5* 8.5* 8.9  MG 2.2  --  2.4  --  2.3  --  2.3 2.3  PHOS 2.3*  < > 2.9 2.5 2.4* 2.4* 2.2* 3.3  < > = values in this interval not displayed.  Liver Function Tests:  Recent Labs Lab 09/04/14 0413  09/06/14 1600 09/07/14 0445 09/07/14 1750 09/08/14  0430 09/09/14 0400  AST 26  --   --  31  --   --   --   ALT 10*  --   --  12*  --   --   --   ALKPHOS 68  --   --  79  --   --   --   BILITOT 1.1  --   --  0.8  --   --   --   PROT 6.7  --   --  6.7  --   --   --   ALBUMIN 2.7*  < > 2.7* 2.8* 2.7* 2.7* 2.7*  < > = values in this interval not displayed. No results for input(s): LIPASE, AMYLASE in the last 168 hours. No results for input(s): AMMONIA in the last 168 hours.  CBC:  Recent Labs Lab 09/05/14 0415 09/06/14 0335 09/07/14 0445 09/08/14 0430 09/09/14 0400  WBC 9.5 7.1 7.1 7.2 11.8*  HGB 10.2* 10.0* 9.3* 9.7* 9.3*  HCT 31.4* 32.0* 30.6* 31.1* 28.8*  MCV 98.4 99.4 99.0 100.3* 97.6  PLT 254 235 203 191 216    Cardiac Enzymes: No results for input(s): CKTOTAL, CKMB, CKMBINDEX, TROPONINI in the last 168 hours.  BNP: BNP (last 3 results)  Recent Labs   05/22/14 1029 05/27/14 1126 06/19/14 1030  BNP 935.2* 1627.8* 738.9*    ProBNP (last 3 results)  Recent Labs  10/09/13 0926 11/14/13 0926 01/14/14 1112  PROBNP 1561.0* 1261.0* 5769.0*      Other results:  Imaging: No results found.   Medications:     Scheduled Medications: . sodium chloride   Intravenous Once  . sodium chloride  10 mL/hr Intravenous Once  . amiodarone  200 mg Oral BID  . atorvastatin  40 mg Oral q1800  . chlorhexidine  15 mL Mouth/Throat BID  . DULoxetine  30 mg Oral Daily  . feeding supplement (NEPRO CARB STEADY)  237 mL Oral BID BM  . insulin aspart  0-9 Units Subcutaneous TID AC & HS  . insulin glargine  5 Units Subcutaneous QHS  . levothyroxine  75 mcg Oral QAC breakfast  . sildenafil  80 mg Oral TID  . sodium chloride irrigation  200 mL Irrigation Q2H while awake    Infusions: . sodium chloride 10 mL/hr at 08/29/14 0543  . DOBUTamine 2.5 mcg/kg/min (09/07/14 0835)  . heparin 1,000 Units/hr (09/09/14 0307)  . norepinephrine (LEVOPHED) Adult infusion Stopped (09/05/14 0615)  . sodium chloride irrigation      PRN Medications: Place/Maintain arterial line **AND** sodium chloride, albuterol, morphine injection, ondansetron (ZOFRAN) IV, oxyCODONE-acetaminophen **AND** oxyCODONE, sodium chloride, sodium chloride, temazepam   Assessment:  1. Left Hip Fracture s/p IM nail on 08/26/14 2. Chronic systolic CHF- Nonischemic cardiomyopathy, on home milrinone 0.5 mcg/kg/min, decreased to 0.25 here with NSVT. Became hypotensive, now on dobutamine 2.5.  3. Acute on chronic renal failure - suspect post-op ATN  4.  Paroxysmal atrial fibrillation requiring DCCV 02/2014, 04/2014- on chronic amio and xarelto. CHADS VASc Score- 5.  Remains in NSR.  5. Diabetes mellitus type 2 6. Depression/anxiety 7. OSA, complex- Needs Bipap at night but had difficulty after dental procedure.  8 Pulmonary HTN - Mixed pulmonary venous hypertension and PAH- on Revatio 80 mg  tid 9. Dental extractions 08/2014 by Dr Enrique Sack 10. Obesity- Body mass index is 32.39 kg/(m^2). 11. Probable chronic respiratory failure - requiring home O2 with ambulation as of this admission 12. Hyponatremia   13. NSVT 14. Delirium- Improved.  Plan/Discussion:    Now on CVVHD. Suspect post-op ATN, oliguric (47 cc/24 hrs).  CVP remains mildly elevated at 10-12.  Off CVVH, very poor UOP still.  He will start HD today.  He is going to need dobutamine long-term (has been on home milrinone).  Switched over now to dobutamine, think this will be safer than milrinone with intermittent HD.  Co-ox not accurate today but has been ok.    Revatio for pulmonary hypertension.   Continue amiodarone 200 mg bid.  Sinus rhythm with occasional PVCs, no atrial fibrillation. Long PR interval noted on EKG. Continue amiodarone. Off Metoprolol XL with soft BP. Off Xarelto due to renal failure. Now on heparin gtt => will start warfarin. Pharmacy following.   Continue PT/OT. Now that he is off CVVH, should be able to get out of bed.   Can go to step-down.   Length of Stay: Johnston Shandy Checo,MD 7:29 AM Advanced Heart Failure Team Pager [phone] (M-F; 7a - 4p)  Please contact [facility] for night-coverage after hours (4p -7a ) and weekends on [email or web address]

## 2014-09-09 NOTE — Progress Notes (Signed)
Pt admitted to the unit @ 1335 in stable condition. Belongings with patient. Spouse accompanying. Plan of care to be continued.

## 2014-09-09 NOTE — Progress Notes (Signed)
Subjective:  BP good this AM on dobutamine- CRRT stopped 7/31- UOP 125 ccs- due for IHD today Objective Vital signs in last 24 hours: Filed Vitals:   09/09/14 0400 09/09/14 0500 09/09/14 0600 09/09/14 0722  BP:      Pulse: 73 71 66 70  Temp:    99.2 F (37.3 C)  TempSrc:    Oral  Resp: 15 16 15 15   Height:      Weight:      SpO2: 99% 97% 99% 100%   Weight change:   Intake/Output Summary (Last 24 hours) at 09/09/14 0756 Last data filed at 09/09/14 0700  Gross per 24 hour  Intake 792.97 ml  Output    295 ml  Net 497.97 ml    Assessment/ Plan: Pt is a 64 y.o. yo male with baseline nonischemic cardiomyopathy on milrinone with stage 3 CKD who was admitted on 08/25/2014 with s/p hip fx- had A on CRF - now HD requiring  Assessment/Plan: 1. Renal- CKD at baseline- with events became dialysis requiring- on CRRT since 7/23- stopped 7/31.  Planning for IHD today, then to observe.  Still a reasonable chance could recover enough renal function to stay off of dialysis-will watch closely.  Given increased UOP and chance he could recover I would like to leave foley catheter in 2. Anemia- hgb falling with events and heparin- check iron stores and add aranesp 3. HTN/volume- UOP of 125.  Given sodium possibly still overloaded ? despite much volume removal when on CRRT  Baseline cardiomyopathy is complicating situation but surprisingly blood pressure is good.  To see how he does after IHD today, possibly be able to move to the hospital floor  4. S/p hip fx- operative repair on 7/19- planning on SNF eventually   Tashunda Vandezande A    Labs: Basic Metabolic Panel:  Recent Labs Lab 09/07/14 1750 09/08/14 0430 09/09/14 0400  NA 133* 133* 128*  K 4.3 4.4 4.7  CL 99* 99* 98*  CO2 28 27 24   GLUCOSE 129* 126* 135*  BUN 6 7 20   CREATININE 1.72* 1.71* 3.18*  CALCIUM 8.5* 8.5* 8.9  PHOS 2.4* 2.2* 3.3   Liver Function Tests:  Recent Labs Lab 09/04/14 0413  09/07/14 0445 09/07/14 1750  09/08/14 0430 09/09/14 0400  AST 26  --  31  --   --   --   ALT 10*  --  12*  --   --   --   ALKPHOS 68  --  79  --   --   --   BILITOT 1.1  --  0.8  --   --   --   PROT 6.7  --  6.7  --   --   --   ALBUMIN 2.7*  < > 2.8* 2.7* 2.7* 2.7*  < > = values in this interval not displayed. No results for input(s): LIPASE, AMYLASE in the last 168 hours. No results for input(s): AMMONIA in the last 168 hours. CBC:  Recent Labs Lab 09/05/14 0415 09/06/14 0335 09/07/14 0445 09/08/14 0430 09/09/14 0400  WBC 9.5 7.1 7.1 7.2 11.8*  HGB 10.2* 10.0* 9.3* 9.7* 9.3*  HCT 31.4* 32.0* 30.6* 31.1* 28.8*  MCV 98.4 99.4 99.0 100.3* 97.6  PLT 254 235 203 191 216   Cardiac Enzymes: No results for input(s): CKTOTAL, CKMB, CKMBINDEX, TROPONINI in the last 168 hours. CBG:  Recent Labs Lab 09/07/14 2143 09/08/14 0724 09/08/14 1215 09/08/14 1529 09/08/14 2211  GLUCAP 124* 105* 110* 112* 164*  Iron Studies: No results for input(s): IRON, TIBC, TRANSFERRIN, FERRITIN in the last 72 hours. Studies/Results: No results found. Medications: Infusions: . sodium chloride 10 mL/hr at 08/29/14 0543  . DOBUTamine 2.5 mcg/kg/min (09/07/14 0835)  . heparin 1,000 Units/hr (09/09/14 0307)  . norepinephrine (LEVOPHED) Adult infusion Stopped (09/05/14 0615)  . sodium chloride irrigation      Scheduled Medications: . sodium chloride   Intravenous Once  . sodium chloride  10 mL/hr Intravenous Once  . amiodarone  200 mg Oral BID  . atorvastatin  40 mg Oral q1800  . chlorhexidine  15 mL Mouth/Throat BID  . DULoxetine  30 mg Oral Daily  . feeding supplement (NEPRO CARB STEADY)  237 mL Oral BID BM  . insulin aspart  0-9 Units Subcutaneous TID AC & HS  . insulin glargine  5 Units Subcutaneous QHS  . levothyroxine  75 mcg Oral QAC breakfast  . sildenafil  80 mg Oral TID  . sodium chloride irrigation  200 mL Irrigation Q2H while awake    have reviewed scheduled and prn medications.  Physical  Exam: General: obese- slightly confused Heart: RRR Lungs: decreased BS at bases Abdomen: distended Extremities: pitting edema Dialysis Access: right IJ vascath placed 7/23    09/09/2014,7:56 AM  LOS: 15 days

## 2014-09-09 NOTE — Progress Notes (Signed)
[facility] PROGRESS NOTE  AHAN EISENBERGER SWF:[id number] DOB: 01-24-1951 DOA: 08/25/2014 PCP: [name], MD   Assessment/Plan:  1. Severe nonischemic cardiomyopathy with chronic systolic heart failure with an EF of 25%. History of V. tach. Patient has AICD, on home milrinone drip. Experienced hypotension requiring norepinephrine and now on dobutamine; plan is to continue dobutamine instead of milrinone. -Patient was started on CVVHD on 08/31/2014 until 09/08/2014 -Cardiology managing HF meds -He tolerated CVVHD; but has remained oliguric and would be initiated on IHD today 8/1 -Patient having urinary output in the low end still (125 cc/hr in the last 24 hours)  2.  Acute on chronic renal failure -Labs showing ongoing downward trend in his creatinine -Xarelto was discontinued and started on IV heparin -Nephrology was consulted, feeling renal failure likely represents postoperative ATN/hypoperfusion from hypotension -CVVH was started on 08/31/2014 and stopped on 7/31 -still oliguric, but slightly better -Nephrology managing  -plan is to initiated IHD (intermittent Hemodialysis) today 8/1  -most likely will ended on permanent HD  3. Constipation -Patient w/o diarrhea; reports BM are ok now. -last BM 8/1  4.  Hyponatremia -I suspect secondary to volume overload and renal failure -Improved after starting CRT, sodium down again since CVVH discontinued on 7/31 -?? Volume related   5. Mechanical trip and fall with left femoral intertrochanteric fracture.  -status post intramedullary nail to left hip on 7/19 -WBAT -PT/OT evaluation -Plan for discharge to SNF when medically stable  6. Paroxysmal atrial fibrillation and pulmonary HTN.  -Xarelto discontinued due to worsening kidney function, now on IV heparin and with plans for coumadin eventually -On Amio -continue revatio for pulm HTN  7. Obstructive sleep apnea.  -Patient refusing BiPAP  8. DM type II. recent A1c of  7.7 -lantus changed to 5 units along with SSI since 7/28, he had hypoglycemia on 7/27 -follow CBG's and adjust as needed -no further hypoglycemic events -eating ok  9. Insomnia: -will continue using restoril PRN   Code Status: Full Family Communication: wife at bedside Disposition Plan: Patient will remain on this unit for now. Plan is to switch off CVVH today (7/31)   Consultants:  Ortho (Dr. Percell Miller)  HF team   Nephrology  Procedures:  S/P intramedullary nail on left hip on 7/18  CVVH   Antibiotics:  None   HPI/Subjective: Patient is doing ok overall. Urine output low, even slightly improved (125cc/hr). No fever, no CP. Cr up to 3 after CVVH stopped on 7/31. Plan is for Intermittent HD today   Objective: Filed Vitals:   09/09/14 1100  BP:   Pulse: 67  Temp: 99 F (37.2 C)  Resp: 20    Intake/Output Summary (Last 24 hours) at 09/09/14 1112 Last data filed at 09/09/14 0800  Gross per 24 hour  Intake 573.57 ml  Output    138 ml  Net 435.57 ml   Filed Weights   09/06/14 0500 09/07/14 0500 09/08/14 0419  Weight: 99.1 kg (218 lb 7.6 oz) 95.6 kg (210 lb 12.2 oz) 98.2 kg (216 lb 7.9 oz)    Exam:   General:  He is awake and alert, oriented X 3, follows commands, chronically ill-appearing; O2 supplementation on board through [place]. Patient had BM this morning. CVVH stopped on 7/31. VSS  Cardiovascular:no rubs, no gallops, RRR  Respiratory: Normal respiratory effort, lungs are clear to auscultation bilaterally  Abdomen: soft, NT, ND, positive Bowel sounds  Musculoskeletal: Surgical incision site appears clean, no evidence of infection. Still with pedal edema  and complaining of pain on his left knee cap and left proximal tight    Data Reviewed: Basic Metabolic Panel:  Recent Labs Lab 09/05/14 0415  09/06/14 0335 09/06/14 1600 09/07/14 0445 09/07/14 1750 09/08/14 0430 09/09/14 0400  NA 132*  132*  < > 132*  132* 135 132* 133* 133* 128*  K 4.0   4.0  < > 4.3  4.3 4.3 4.1 4.3 4.4 4.7  CL 99*  98*  < > 99*  101 101 100* 99* 99* 98*  CO2 27  26  < > 26  27 27 26 28 27 24   GLUCOSE 120*  120*  < > 125*  124* 140* 147* 129* 126* 135*  BUN <5*  5*  < > 6  6 6 7 6 7 20   CREATININE 1.62*  1.69*  < > 1.80*  1.69* 1.69* 1.74* 1.72* 1.71* 3.18*  CALCIUM 8.0*  8.0*  < > 8.3*  8.3* 8.4* 8.3* 8.5* 8.5* 8.9  MG 2.2  --  2.4  --  2.3  --  2.3 2.3  PHOS 2.3*  < > 2.9 2.5 2.4* 2.4* 2.2* 3.3  < > = values in this interval not displayed. CBC:  Recent Labs Lab 09/05/14 0415 09/06/14 0335 09/07/14 0445 09/08/14 0430 09/09/14 0400  WBC 9.5 7.1 7.1 7.2 11.8*  HGB 10.2* 10.0* 9.3* 9.7* 9.3*  HCT 31.4* 32.0* 30.6* 31.1* 28.8*  MCV 98.4 99.4 99.0 100.3* 97.6  PLT 254 235 203 191 216   BNP (last 3 results)  Recent Labs  05/22/14 1029 05/27/14 1126 06/19/14 1030  BNP 935.2* 1627.8* 738.9*    ProBNP (last 3 results)  Recent Labs  10/09/13 0926 11/14/13 0926 01/14/14 1112  PROBNP 1561.0* 1261.0* 5769.0*   CBG:  Recent Labs Lab 09/08/14 0724 09/08/14 1215 09/08/14 1529 09/08/14 2211 09/09/14 0724  GLUCAP 105* 110* 112* 164* 122*    Recent Results (from the past 240 hour(s))  Clostridium Difficile by PCR (not at [facility])     Status: None   Collection Time: 09/02/14 10:27 PM  Result Value Ref Range Status   C difficile by pcr NEGATIVE NEGATIVE Final     Studies: No results found.  Scheduled Meds: . sodium chloride   Intravenous Once  . sodium chloride  10 mL/hr Intravenous Once  . amiodarone  200 mg Oral BID  . atorvastatin  40 mg Oral q1800  . chlorhexidine  15 mL Mouth/Throat BID  . darbepoetin (ARANESP) injection - DIALYSIS  100 mcg Intravenous Q Mon-HD  . DULoxetine  30 mg Oral Daily  . feeding supplement (NEPRO CARB STEADY)  237 mL Oral BID BM  . insulin aspart  0-9 Units Subcutaneous TID AC & HS  . insulin glargine  5 Units Subcutaneous QHS  . levothyroxine  75 mcg Oral QAC breakfast  . sildenafil   80 mg Oral TID  . sodium chloride irrigation  200 mL Irrigation Q2H while awake   Continuous Infusions: . sodium chloride 10 mL/hr at 08/29/14 0543  . DOBUTamine 2.5 mcg/kg/min (09/09/14 0800)  . heparin 1,000 Units/hr (09/09/14 0800)  . norepinephrine (LEVOPHED) Adult infusion Stopped (09/05/14 0615)  . sodium chloride irrigation      Principal Problem:   Fracture, intertrochanteric, left femur Active Problems:   Essential hypertension, benign   Persistent atrial fibrillation   Automatic implantable cardioverter-defibrillator in situ   DM2 (diabetes mellitus, type 2)   OSA (obstructive sleep apnea), intolerant to CPAP/BIPAP   Ventricular tachycardia  Chronic systolic CHF (congestive heart failure)   Hip fracture   Left femoral shaft fracture   Acute on chronic renal failure   Hyponatremia   Acute on chronic systolic CHF (congestive heart failure)   Acute renal failure superimposed on stage 4 chronic kidney disease   SOB (shortness of breath)   Insomnia   Time spent: 30 minutes   Barton Dubois  [facility] Pager [phone]. If 7PM-7AM, please contact night-coverage at [email or web address], password [email or web address] 09/09/2014, 11:12 AM  LOS: 15 days

## 2014-09-09 NOTE — Progress Notes (Addendum)
ANTICOAGULATION CONSULT NOTE - Follow Up Consult  Pharmacy Consult for Heparin and Coumadin Indication: atrial fibrillation  Allergies  Allergen Reactions  . Bydureon [Exenatide] Other (See Comments)    sweating  . Losartan Potassium Other (See Comments)    insomnia    Patient Measurements: Height: 5\' 10"  (177.8 cm) Weight: 216 lb 7.9 oz (98.2 kg) IBW/kg (Calculated) : 73 Heparin Dosing Weight: 96kg  Vital Signs: Temp: 99 F (37.2 C) (08/01 1100) Temp Source: Oral (08/01 1100) BP: 104/54 mmHg (08/01 1140) Pulse Rate: 69 (08/01 1200)  Labs:  Recent Labs  09/07/14 0445 09/07/14 1750 09/08/14 0430 09/08/14 1525 09/09/14 0214 09/09/14 0400 09/09/14 1151 09/09/14 1152  HGB 9.3*  --  9.7*  --   --  9.3*  --   --   HCT 30.6*  --  31.1*  --   --  28.8*  --   --   PLT 203  --  191  --   --  216  --   --   APTT 151*  --  >200* 40* 50*  --   --   --   LABPROT  --   --   --   --   --   --  15.6*  --   INR  --   --   --   --   --   --  1.22  --   HEPARINUNFRC 0.51  --  0.95*  --   --  <0.10*  --  <0.10*  CREATININE 1.74* 1.72* 1.71*  --   --  3.18*  --   --     Estimated Creatinine Clearance: 28.3 mL/min (by C-G formula based on Cr of 3.18).   Medications:  Heparin @ 1000 units/hr  Assessment: 64yom on xarelto pta for afib, transitioned to IV heparin on admit since he needed CRRT. Heparin levels and aPTTs were supratherapeutic while on CRRT, however, CRRT stopped yesterday morning and now heparin level is undetectable. No issues with infusion per RN. He will also start intermittent hemodialysis today so his pta xarelto will be switched to coumadin. Baseline INR 1.22. Coumadin score = 7.  Goal of Therapy:  Heparin level 0.3-0.7 units/ml Monitor platelets by anticoagulation protocol: Yes   Plan:  1) Rebolus heparin 3000 units x 1 2) Increase heparin drip to 1300 units/hr 3) Check 8 hour heparin level 4) Coumadin 7.5mg  x 1 5) Daily INR 6) Coumadin  education  Deboraha Sprang 09/09/2014,1:43 PM  _______________________________ Addendum:  HL now 0.10 on 1300 units/hr  Plan: Bolus with 3000 units heparin x 1 Increase drip to 1600 units/hr HL at [place], PharmD, [place] Pharmacist Pager [phone] 09/09/2014 9:55 PM

## 2014-09-09 NOTE — Progress Notes (Signed)
Had first hemodialysis Tx since CRRT. Ran 3.5hr with DFR of 600 and BFR of 300. Tolerated without problems. Removed 2L of fluid

## 2014-09-09 NOTE — Progress Notes (Signed)
ANTICOAGULATION CONSULT NOTE - Follow Up Consult  Pharmacy Consult for Heparin  Indication: atrial fibrillation  Allergies  Allergen Reactions  . Bydureon [Exenatide] Other (See Comments)    sweating  . Losartan Potassium Other (See Comments)    insomnia    Patient Measurements: Height: 5\' 10"  (177.8 cm) Weight: 216 lb 7.9 oz (98.2 kg) IBW/kg (Calculated) : 73 Heparin Dosing Weight: 96 kg  Vital Signs: Temp: 98.3 F (36.8 C) (07/31 2332) Temp Source: Oral (07/31 2332) Pulse Rate: 63 (08/01 0000)  Labs:  Recent Labs  09/06/14 0335  09/07/14 0445 09/07/14 1750 09/08/14 0430 09/08/14 1525 09/09/14 0214  HGB 10.0*  --  9.3*  --  9.7*  --   --   HCT 32.0*  --  30.6*  --  31.1*  --   --   PLT 235  --  203  --  191  --   --   APTT 138*  --  151*  --  >200* 40* 50*  HEPARINUNFRC 0.59  --  0.51  --  0.95*  --   --   CREATININE 1.80*  1.69*  < > 1.74* 1.72* 1.71*  --   --   < > = values in this interval not displayed.  Estimated Creatinine Clearance: 52.6 mL/min (by C-G formula based on Cr of 1.71).     Assessment: 73 YOM admitted 08/25/2014 with L hip fracture after fall on Xarelto PTA for hx Afib. The patient is noted to have both a PICC line and a RIJ HD catheter.   PMH NICM, CAD, CVA 2007, AICD, hx of pituitary tumor, depression, CKD 3, PAF, DM II, HTN, lupus anticoagulant positive, hypothyroidism.  Anticoagulation/Heme: AFib Home Xarelto switched to heparin  Previously on gtt, however stopped d/t pt being anticoagulated with heparin from CRRT Per RN, had increasing CVVHD rates yesterday which could have increased heparin rate CBC low but stable  CRRT stopped 7/31 AM > PTT now back down to 40 off of heparin with CRRT and off systemic heparin.  Resumed systemic heparin 7/31 p.m. aPTT subtherapeutic (50 sec) on 800 units/hr. Suspect that heparin level and aPTT may be correlating at this point as pt off Xarelto for 2 weeks.  Goal of Therapy:  Heparin level 0.3-0.7  units/ml aPTT 66-102 seconds Monitor platelets by anticoagulation protocol: Yes   Plan:  - Increase IV heparin to 1000 units/hr. - Check PTT and heparin level in 8 hrs - if correlating, please d/c daily aPTT  Sherlon Handing, PharmD, BCPS Clinical pharmacist, pager [phone]  09/09/2014 2:56 AM

## 2014-09-10 DIAGNOSIS — F4322 Adjustment disorder with anxiety: Secondary | ICD-10-CM

## 2014-09-10 DIAGNOSIS — I482 Chronic atrial fibrillation, unspecified: Secondary | ICD-10-CM | POA: Insufficient documentation

## 2014-09-10 LAB — CBC
HCT: 26.3 % — ABNORMAL LOW (ref 39.0–52.0)
HEMOGLOBIN: 8.2 g/dL — AB (ref 13.0–17.0)
MCH: 30.3 pg (ref 26.0–34.0)
MCHC: 31.2 g/dL (ref 30.0–36.0)
MCV: 97 fL (ref 78.0–100.0)
Platelets: 187 10*3/uL (ref 150–400)
RBC: 2.71 MIL/uL — ABNORMAL LOW (ref 4.22–5.81)
RDW: 16.4 % — ABNORMAL HIGH (ref 11.5–15.5)
WBC: 11.4 10*3/uL — AB (ref 4.0–10.5)

## 2014-09-10 LAB — RENAL FUNCTION PANEL
ANION GAP: 9 (ref 5–15)
Albumin: 2.4 g/dL — ABNORMAL LOW (ref 3.5–5.0)
BUN: 18 mg/dL (ref 6–20)
CALCIUM: 8.3 mg/dL — AB (ref 8.9–10.3)
CO2: 28 mmol/L (ref 22–32)
CREATININE: 2.85 mg/dL — AB (ref 0.61–1.24)
Chloride: 97 mmol/L — ABNORMAL LOW (ref 101–111)
GFR calc Af Amer: 26 mL/min — ABNORMAL LOW (ref >60)
GFR, EST NON AFRICAN AMERICAN: 22 mL/min — AB (ref >60)
GLUCOSE: 99 mg/dL (ref 65–99)
PHOSPHORUS: 4.1 mg/dL (ref 2.5–4.6)
POTASSIUM: 4.4 mmol/L (ref 3.5–5.1)
Sodium: 134 mmol/L — ABNORMAL LOW (ref 135–145)

## 2014-09-10 LAB — MAGNESIUM: Magnesium: 2.2 mg/dL (ref 1.7–2.4)

## 2014-09-10 LAB — URINE MICROSCOPIC-ADD ON

## 2014-09-10 LAB — IRON AND TIBC
Iron: 14 ug/dL — ABNORMAL LOW (ref 45–182)
Saturation Ratios: 6 % — ABNORMAL LOW (ref 17.9–39.5)
TIBC: 232 ug/dL — ABNORMAL LOW (ref 250–450)
UIBC: 218 ug/dL

## 2014-09-10 LAB — URINALYSIS, ROUTINE W REFLEX MICROSCOPIC
GLUCOSE, UA: NEGATIVE mg/dL
Ketones, ur: 15 mg/dL — AB
Nitrite: POSITIVE — AB
Protein, ur: >300 mg/dL — AB
Specific Gravity, Urine: 1.027 (ref 1.005–1.030)
Urobilinogen, UA: 2 mg/dL — ABNORMAL HIGH (ref 0.0–1.0)
pH: 6.5 (ref 5.0–8.0)

## 2014-09-10 LAB — CARBOXYHEMOGLOBIN
Carboxyhemoglobin: 1.8 % — ABNORMAL HIGH (ref 0.5–1.5)
Methemoglobin: 1.1 % (ref 0.0–1.5)
O2 SAT: 66 %
TOTAL HEMOGLOBIN: 8.4 g/dL — AB (ref 13.5–18.0)

## 2014-09-10 LAB — FERRITIN: Ferritin: 170 ng/mL (ref 24–336)

## 2014-09-10 LAB — GLUCOSE, CAPILLARY
GLUCOSE-CAPILLARY: 99 mg/dL (ref 65–99)
GLUCOSE-CAPILLARY: 120 mg/dL — AB (ref 65–99)
GLUCOSE-CAPILLARY: 116 mg/dL — AB (ref 65–99)
Glucose-Capillary: 118 mg/dL — ABNORMAL HIGH (ref 65–99)
Glucose-Capillary: 93 mg/dL (ref 65–99)

## 2014-09-10 LAB — PROTIME-INR
INR: 1.27 (ref 0.00–1.49)
PROTHROMBIN TIME: 16.1 s — AB (ref 11.6–15.2)

## 2014-09-10 LAB — HEPARIN LEVEL (UNFRACTIONATED)
Heparin Unfractionated: 0.36 IU/mL (ref 0.30–0.70)
Heparin Unfractionated: <0.1 IU/mL — ABNORMAL LOW (ref 0.30–0.70)

## 2014-09-10 MED ORDER — NA FERRIC GLUC CPLX IN SUCROSE 12.5 MG/ML IV SOLN
125.0000 mg | Freq: Every day | INTRAVENOUS | Status: AC
Start: 2014-09-10 — End: 2014-09-13
  Administered 2014-09-10 – 2014-09-11 (×2): 125 mg via INTRAVENOUS
  Filled 2014-09-10 (×5): qty 10

## 2014-09-10 MED ORDER — WARFARIN SODIUM 7.5 MG PO TABS
7.5000 mg | ORAL_TABLET | Freq: Once | ORAL | Status: AC
  Administered 2014-09-10: 7.5 mg via ORAL
  Filled 2014-09-10: qty 1

## 2014-09-10 MED ORDER — DEXTROSE 5 % IV SOLN
1.0000 g | INTRAVENOUS | Status: DC
  Administered 2014-09-10 – 2014-09-16 (×7): 1 g via INTRAVENOUS
  Filled 2014-09-10 (×10): qty 10

## 2014-09-10 MED ORDER — FUROSEMIDE 10 MG/ML IJ SOLN
80.0000 mg | Freq: Four times a day (QID) | INTRAMUSCULAR | Status: DC
  Administered 2014-09-10 – 2014-09-11 (×4): 80 mg via INTRAVENOUS
  Filled 2014-09-10 (×8): qty 8

## 2014-09-10 MED ORDER — HEPARIN BOLUS VIA INFUSION
3000.0000 [IU] | Freq: Once | INTRAVENOUS | Status: AC
  Administered 2014-09-10: 3000 [IU] via INTRAVENOUS
  Filled 2014-09-10: qty 3000

## 2014-09-10 NOTE — Progress Notes (Signed)
Patient ID: Larry [name], male   DOB: 05/09/1950, 64 y.o.   MRN: [id number] Advanced Heart Failure Rounding Note   Subjective:    Larry [name] is a 64 y/o man with history of chronic systolic CHF (nonischemic cardiomyopathy) s/p 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 He is on chronic milrinone 0.5 mcg.  Prior to admit he was being considered for LVAD.   Dischaged 7/16 after dental procedures. Also had RHC with elevate pulmonary pressures. Revatio was increased to 80 mg tid during that stay.  Admitted with fall---> broken L  hip . 7/18 had IM Nail L femur.  7/23 started CVVHD, CVVH stopped 7/31.   Hypotensive 7/28, stopped milrinone and started norepinephrine.  Pressure recovered, now off norepinephrine and on dobutamine 2.5 mcg/kg/min.  BP stable today.  He had HD on 8/1, tolerated it well in terms of BP.   Objective:   Weight Range:  Vital Signs:   Temp:  [97.9 F (36.6 C)-99 F (37.2 C)] 97.9 F (36.6 C) (08/02 0743) Pulse Rate:  [67-86] 67 (08/02 0743) Resp:  [11-20] 16 (08/02 0743) BP: (82-154)/(39-117) 94/50 mmHg (08/02 0743) SpO2:  [97 %-100 %] 100 % (08/02 0743) Arterial Line BP: (112-123)/(48-52) 123/50 mmHg (08/01 1200) Weight:  [219 lb 9.3 oz (99.6 kg)-226 lb 3.1 oz (102.6 kg)] 220 lb 7.4 oz (100 kg) (08/02 0500) Last BM Date: 09/09/14  Weight change: Filed Weights   09/09/14 1930 09/09/14 2315 09/10/14 0500  Weight: 226 lb 3.1 oz (102.6 kg) 219 lb 9.3 oz (99.6 kg) 220 lb 7.4 oz (100 kg)    Intake/Output:   Intake/Output Summary (Last 24 hours) at 09/10/14 0803 Last data filed at 09/10/14 0700  Gross per 24 hour  Intake 1135.55 ml  Output   2066 ml  Net -930.45 ml     Physical Exam: General:  NAD  HEENT: normal Neck: supple. JVP 10 cm. RIJ trialysis catheter.  Carotids 2+ bilat; no bruits. No lymphadenopathy or thryomegaly appreciated. Cor: PMI nondisplaced. Regular rate  & rhythm. No rubs, gallops or murmurs. Lungs: Diminished both bases with mild crackles Abdomen: soft, nontender, nondistended. No hepatosplenomegaly. No bruits or masses. Good bowel sounds. Extremities: no cyanosis, clubbing, rash. RUE PICC dual lumen . R Thigh lateral aspect dressing intact x2. 1+ ankle edema. Neuro: sleepy but awakens  Telemetry: NSR   Labs: Basic Metabolic Panel:  Recent Labs Lab 09/06/14 0335  09/07/14 0445 09/07/14 1750 09/08/14 0430 09/09/14 0400 09/10/14 0525  NA 132*  132*  < > 132* 133* 133* 128* 134*  K 4.3  4.3  < > 4.1 4.3 4.4 4.7 4.4  CL 99*  101  < > 100* 99* 99* 98* 97*  CO2 26  27  < > 26 28 27 24 28   GLUCOSE 125*  124*  < > 147* 129* 126* 135* 99  BUN 6  6  < > 7 6 7 20 18   CREATININE 1.80*  1.69*  < > 1.74* 1.72* 1.71* 3.18* 2.85*  CALCIUM 8.3*  8.3*  < > 8.3* 8.5* 8.5* 8.9 8.3*  MG 2.4  --  2.3  --  2.3 2.3 2.2  PHOS 2.9  < > 2.4* 2.4* 2.2* 3.3 4.1  < > = values in this interval not displayed.  Liver Function Tests:  Recent Labs Lab 09/04/14 0413  09/07/14 0445 09/07/14 1750 09/08/14 0430 09/09/14 0400 09/10/14 0525  AST 26  --  31  --   --   --   --   ALT 10*  --  12*  --   --   --   --   ALKPHOS 68  --  79  --   --   --   --   BILITOT 1.1  --  0.8  --   --   --   --   PROT 6.7  --  6.7  --   --   --   --   ALBUMIN 2.7*  < > 2.8* 2.7* 2.7* 2.7* 2.4*  < > = values in this interval not displayed. No results for input(s): LIPASE, AMYLASE in the last 168 hours. No results for input(s): AMMONIA in the last 168 hours.  CBC:  Recent Labs Lab 09/06/14 0335 09/07/14 0445 09/08/14 0430 09/09/14 0400 09/10/14 0525  WBC 7.1 7.1 7.2 11.8* 11.4*  HGB 10.0* 9.3* 9.7* 9.3* 8.2*  HCT 32.0* 30.6* 31.1* 28.8* 26.3*  MCV 99.4 99.0 100.3* 97.6 97.0  PLT 235 203 191 216 187    Cardiac Enzymes: No results for input(s): CKTOTAL, CKMB, CKMBINDEX, TROPONINI in the last 168 hours.  BNP: BNP (last 3 results)  Recent Labs   05/22/14 1029 05/27/14 1126 06/19/14 1030  BNP 935.2* 1627.8* 738.9*    ProBNP (last 3 results)  Recent Labs  10/09/13 0926 11/14/13 0926 01/14/14 1112  PROBNP 1561.0* 1261.0* 5769.0*      Other results:  Imaging: No results found.   Medications:     Scheduled Medications: . sodium chloride   Intravenous Once  . sodium chloride  10 mL/hr Intravenous Once  . amiodarone  200 mg Oral BID  . atorvastatin  40 mg Oral q1800  . chlorhexidine  15 mL Mouth/Throat BID  . darbepoetin (ARANESP) injection - DIALYSIS  100 mcg Intravenous Q Mon-HD  . DULoxetine  30 mg Oral Daily  . feeding supplement (NEPRO CARB STEADY)  237 mL Oral BID BM  . ferric gluconate (FERRLECIT/NULECIT) IV  125 mg Intravenous Daily  . furosemide  80 mg Intravenous Q6H  . insulin aspart  0-9 Units Subcutaneous TID AC & HS  . insulin glargine  5 Units Subcutaneous QHS  . levothyroxine  75 mcg Oral QAC breakfast  . sildenafil  80 mg Oral TID  . sodium chloride irrigation  200 mL Irrigation Q2H while awake  . Warfarin - Pharmacist Dosing Inpatient   Does not apply q1800    Infusions: . sodium chloride 10 mL/hr at 08/29/14 0543  . DOBUTamine 2.5 mcg/kg/min (09/09/14 1900)  . heparin 1,600 Units/hr (09/10/14 0332)  . sodium chloride irrigation      PRN Medications: Place/Maintain arterial line **AND** sodium chloride, albuterol, ALPRAZolam, morphine injection, ondansetron (ZOFRAN) IV, oxyCODONE-acetaminophen **AND** oxyCODONE, sodium chloride, sodium chloride, temazepam   Assessment:  1. Left Hip Fracture s/p IM nail on 08/26/14 2. Chronic systolic CHF- Nonischemic cardiomyopathy, on home milrinone 0.5 mcg/kg/min, decreased to 0.25 here with NSVT. Became hypotensive, now on dobutamine 2.5.  3. Acute on chronic renal failure - suspect post-op ATN.  CVVH => HD.  4.  Paroxysmal atrial fibrillation requiring DCCV 02/2014, 04/2014- on chronic amio and xarelto. CHADS VASc Score- 5.  Remains in NSR.  5.  Diabetes mellitus type 2 6. Depression/anxiety 7. OSA, complex- Needs Bipap at night but had difficulty after dental procedure.  8 Pulmonary HTN - Mixed pulmonary venous hypertension and PAH- on Revatio 80 mg tid 9. Dental extractions 08/2014 by Dr Enrique Sack 10.  Obesity- Body mass index is 32.39 kg/(m^2). 11. Probable chronic respiratory failure - requiring home O2 with ambulation as of this admission 12. Hyponatremia   13. NSVT 14. Delirium- Improved.   Plan/Discussion:    Suspect post-op ATN, oliguric (47 cc/24 hrs).  Now getting HD (off CVVH).  He tolerated well yesterday.  Plan per Dr Moshe Cipro.  Leaving foley in to continue to monitor for renal recovery.  He will start Lasix 80 mg IV every 6 hrs.   Switched over now to dobutamine, think this will be safer than milrinone with intermittent HD.  Co-ox 66% (ok).   Revatio for pulmonary hypertension.   Continue amiodarone 200 mg bid.  Sinus rhythm with occasional PVCs, no atrial fibrillation. Long PR interval noted on EKG. Continue amiodarone. Off Metoprolol XL with soft BP. Off Xarelto due to renal failure. Now on heparin gtt => he is now on warfarin, will need to check with renal to make sure this will not interfere with any planned vascular access placement.   Continue PT/OT. Now that he is off CVVH, should be able to get out of bed.   Length of Stay: Gratz Lyzbeth Genrich,MD 8:03 AM Advanced Heart Failure Team Pager [phone] (M-F; 7a - 4p)  Please contact [facility] for night-coverage after hours (4p -7a ) and weekends on [email or web address]

## 2014-09-10 NOTE — Telephone Encounter (Signed)
Dr Halford Chessman completed form for PA and they have been faxed [place]

## 2014-09-10 NOTE — Progress Notes (Signed)
[facility] PROGRESS NOTE  Larry [name] NOB:[id number] DOB: April 26, 1950 DOA: 08/25/2014 PCP: [name], MD   Interim summary 64 y/o man with history of chronic systolic CHF (nonischemic cardiomyopathy) s/p 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 He is on chronic milrinone 0.5 mcg. Admitted with fall---> broken L hip . 7/18 had IM Nail L femur. Course complicated with fluid overload and acute on chronic renal failure (due to ATN most liekely). Required CVVHD 7/23>> 7/31. Now on intermittent HD and being assessed for ESRD and HD dependency .  Assessment/Plan: 1. Severe nonischemic cardiomyopathy with chronic systolic heart failure with an EF of 25%. History of V. tach. Patient has AICD, on home milrinone drip. Experienced hypotension requiring norepinephrine and now on dobutamine; plan is to continue dobutamine instead of milrinone. -Patient was started on CVVHD on 08/31/2014 until 09/08/2014 -Cardiology managing HF meds -He tolerated CVVHD; but has remained oliguric and was initiated on IHD (8/1) -2L removed during HD on 8/1  2.  Acute on chronic renal failure -Labs showing ongoing downward trend in his creatinine -Xarelto was discontinued and started on IV heparin -Nephrology was consulted, feeling renal failure likely represents postoperative ATN/hypoperfusion from hypotension -CVVH was started on 08/31/2014 and stopped on 7/31 -still oliguric, but slightly better -Nephrology managing  -plan is to observed and continue HD intermittently as needed last treatment on 8/1  -most likely will ended on permanent HD  3. Constipation -Patient w/o diarrhea; reports BM are ok now. -last BM 8/1  4.  Hyponatremia -I suspect secondary to volume overload and renal failure -Improved after HD -NA 134 on (09/10/14)  5. Mechanical trip and fall with left femoral intertrochanteric fracture.  -status  post intramedullary nail to left hip on 7/18 -WBAT -PT/OT evaluation -Plan for discharge to SNF when medically stable  6. Paroxysmal atrial fibrillation and pulmonary HTN.  -Xarelto discontinued due to worsening kidney function, now on IV heparin and with plans for coumadin eventually -On Amio -continue revatio for pulm HTN  7. Obstructive sleep apnea.  -Patient refusing BiPAP; explained importance of compliance with macine  8. DM type II. recent A1c of 7.7 -lantus changed to 5 units along with SSI since 7/28, he had hypoglycemia on 7/27 -follow CBG's and adjust as needed -no further hypoglycemic events; eating ok  9. Insomnia: -will continue using restoril PRN   Code Status: Full Family Communication: wife at bedside Disposition Plan: Patient in the process of been assessed for ESRD. On Dobutamine drip for heart failure.    Consultants:  Ortho (Dr. Percell Miller)  HF team   Nephrology  Procedures:  S/P intramedullary nail on left hip on 7/18  CVVH   Antibiotics:  None   HPI/Subjective: Patient is doing ok overall. No fever and no CP. Tolerated HD on 09/09/14. Complaining of some anxiety today and is having slight soft BP.   Objective: Filed Vitals:   09/10/14 1600  BP: 92/56  Pulse: 68  Temp: 98.1 F (36.7 C)  Resp: 15    Intake/Output Summary (Last 24 hours) at 09/10/14 1659 Last data filed at 09/10/14 1616  Gross per 24 hour  Intake 2012.05 ml  Output   2065 ml  Net -52.95 ml   Filed Weights   09/09/14 1930 09/09/14 2315 09/10/14 0500  Weight: 102.6 kg (226 lb 3.1 oz) 99.6 kg (219 lb 9.3 oz) 100 kg (220 lb 7.4 oz)    Exam:  General:  He is awake and alert, oriented X 3, follows commands, chronically ill-appearing; O2 supplementation on board through [place]. Patient tolerated IHD on 8/1 w/o problems. VSS  Cardiovascular:no rubs, no gallops, RRR  Respiratory: Normal respiratory effort, lungs are clear to auscultation bilaterally  Abdomen: soft, NT,  ND, positive Bowel sounds  Musculoskeletal: Surgical incision site appears clean, no evidence of infection. Still with pedal edema and complaining of pain on his left knee cap and left proximal tight    Data Reviewed: Basic Metabolic Panel:  Recent Labs Lab 09/06/14 0335  09/07/14 0445 09/07/14 1750 09/08/14 0430 09/09/14 0400 09/10/14 0525  NA 132*  132*  < > 132* 133* 133* 128* 134*  K 4.3  4.3  < > 4.1 4.3 4.4 4.7 4.4  CL 99*  101  < > 100* 99* 99* 98* 97*  CO2 26  27  < > 26 28 27 24 28   GLUCOSE 125*  124*  < > 147* 129* 126* 135* 99  BUN 6  6  < > 7 6 7 20 18   CREATININE 1.80*  1.69*  < > 1.74* 1.72* 1.71* 3.18* 2.85*  CALCIUM 8.3*  8.3*  < > 8.3* 8.5* 8.5* 8.9 8.3*  MG 2.4  --  2.3  --  2.3 2.3 2.2  PHOS 2.9  < > 2.4* 2.4* 2.2* 3.3 4.1  < > = values in this interval not displayed. CBC:  Recent Labs Lab 09/06/14 0335 09/07/14 0445 09/08/14 0430 09/09/14 0400 09/10/14 0525  WBC 7.1 7.1 7.2 11.8* 11.4*  HGB 10.0* 9.3* 9.7* 9.3* 8.2*  HCT 32.0* 30.6* 31.1* 28.8* 26.3*  MCV 99.4 99.0 100.3* 97.6 97.0  PLT 235 203 191 216 187   BNP (last 3 results)  Recent Labs  05/22/14 1029 05/27/14 1126 06/19/14 1030  BNP 935.2* 1627.8* 738.9*    ProBNP (last 3 results)  Recent Labs  10/09/13 0926 11/14/13 0926 01/14/14 1112  PROBNP 1561.0* 1261.0* 5769.0*   CBG:  Recent Labs Lab 09/09/14 1104 09/09/14 1620 09/09/14 2348 09/10/14 0807 09/10/14 1223  GLUCAP 110* 109* 93 99 120*    Recent Results (from the past 240 hour(s))  Clostridium Difficile by PCR (not at [facility])     Status: None   Collection Time: 09/02/14 10:27 PM  Result Value Ref Range Status   Toxigenic C Difficile by pcr NEGATIVE NEGATIVE Final     Studies: No results found.  Scheduled Meds: . sodium chloride   Intravenous Once  . sodium chloride  10 mL/hr Intravenous Once  . amiodarone  200 mg Oral BID  . atorvastatin  40 mg Oral q1800  . cefTRIAXone (ROCEPHIN)  IV  1 g  Intravenous Q24H  . chlorhexidine  15 mL Mouth/Throat BID  . darbepoetin (ARANESP) injection - DIALYSIS  100 mcg Intravenous Q Mon-HD  . DULoxetine  30 mg Oral Daily  . feeding supplement (NEPRO CARB STEADY)  237 mL Oral BID BM  . ferric gluconate (FERRLECIT/NULECIT) IV  125 mg Intravenous Daily  . furosemide  80 mg Intravenous Q6H  . insulin aspart  0-9 Units Subcutaneous TID AC & HS  . insulin glargine  5 Units Subcutaneous QHS  . levothyroxine  75 mcg Oral QAC breakfast  . sildenafil  80 mg Oral TID  . sodium chloride irrigation  200 mL Irrigation Q2H while awake  . warfarin  7.5 mg Oral ONCE-1800  . Warfarin - Pharmacist Dosing Inpatient   Does not apply q1800   Continuous Infusions: .  sodium chloride 10 mL/hr at 08/29/14 0543  . DOBUTamine 2.5 mcg/kg/min (09/09/14 1900)  . heparin 1,600 Units/hr (09/10/14 0332)  . sodium chloride irrigation      Principal Problem:   Fracture, intertrochanteric, left femur Active Problems:   Essential hypertension, benign   Persistent atrial fibrillation   Automatic implantable cardioverter-defibrillator in situ   DM2 (diabetes mellitus, type 2)   OSA (obstructive sleep apnea), intolerant to CPAP/BIPAP   Ventricular tachycardia   Chronic systolic CHF (congestive heart failure)   Hip fracture   Left femoral shaft fracture   Acute on chronic renal failure   Hyponatremia   Acute on chronic systolic CHF (congestive heart failure)   Acute renal failure superimposed on stage 4 chronic kidney disease   SOB (shortness of breath)   Insomnia   Oliguria   Time spent: 30 minutes   Barton Dubois  [facility] Pager [phone]. If 7PM-7AM, please contact night-coverage at [email or web address], password [facility] 09/10/2014, 4:59 PM  LOS: 16 days

## 2014-09-10 NOTE — Progress Notes (Signed)
ANTICOAGULATION CONSULT NOTE - Follow Up Consult  Pharmacy Consult for heparin Indication: atrial fibrillation   Labs:  Recent Labs  09/07/14 1750  09/08/14 0430 09/08/14 1525 09/09/14 0214 09/09/14 0400 09/09/14 1151 09/09/14 1152 09/09/14 2100 09/10/14 0525  HGB  --   < > 9.7*  --   --  9.3*  --   --   --  8.2*  HCT  --   --  31.1*  --   --  28.8*  --   --   --  26.3*  PLT  --   --  191  --   --  216  --   --   --  187  APTT  --   --  >200* 40* 50*  --   --   --   --   --   LABPROT  --   --   --   --   --   --  15.6*  --   --  16.1*  INR  --   --   --   --   --   --  1.22  --   --  1.27  HEPARINUNFRC  --   < > 0.95*  --   --  <0.10*  --  <0.10* 0.10* 0.36  CREATININE 1.72*  --  1.71*  --   --  3.18*  --   --   --   --   < > = values in this interval not displayed.    Assessment/Plan:  64yo male therapeutic on heparin after rate changes. Will continue gtt at current rate and confirm stable with additional level.   Wynona Neat, PharmD, BCPS  09/10/2014,6:27 AM

## 2014-09-10 NOTE — Progress Notes (Signed)
Subjective:  Had IHD last night with 2 liters removed tolerated well.  Really no UOP Objective Vital signs in last 24 hours: Filed Vitals:   09/10/14 0100 09/10/14 0300 09/10/14 0400 09/10/14 0500  BP: 82/44 88/42 92/40  100/41  Pulse: 68 67 69 69  Temp:   98.6 F (37 C)   TempSrc:   Oral   Resp: 15 18 12 13   Height:      Weight:    100 kg (220 lb 7.4 oz)  SpO2: 99% 100% 100% 99%   Weight change:   Intake/Output Summary (Last 24 hours) at 09/10/14 0732 Last data filed at 09/10/14 0500  Gross per 24 hour  Intake 1103.75 ml  Output   2084 ml  Net -980.25 ml    Assessment/ Plan: Pt is a 64 y.o. yo male with baseline nonischemic cardiomyopathy on milrinone with stage 3 CKD who was admitted on 08/25/2014 with s/p hip fx- had A on CRF - now HD requiring  Assessment/Plan: 1. Renal- CKD at baseline- with events became dialysis requiring- on CRRT from 7/23- 7/31. Had IHD 8/1. My plan is now to observe.  Still a reasonable chance could recover enough renal function to stay off of dialysis-will watch closely.  Given UOP and chance he could recover I would like to leave foley catheter in.  Vascath in since 7/23 so will need to be removed by end of week 2. Anemia- hgb falling with events and heparin- replete iron and have added aranesp 3. HTN/volume-   Given sodium possibly still overloaded- improved after HD.    Baseline cardiomyopathy is complicating situation but surprisingly blood pressure is reasonable.  Will add back some lasix and follow  4. S/p hip fx- operative repair on 7/19- planning on SNF eventually 5. Urine looks cloudy- will check U/A   Alanmichael Barmore A    Labs: Basic Metabolic Panel:  Recent Labs Lab 09/08/14 0430 09/09/14 0400 09/10/14 0525  NA 133* 128* 134*  K 4.4 4.7 4.4  CL 99* 98* 97*  CO2 27 24 28   GLUCOSE 126* 135* 99  BUN 7 20 18   CREATININE 1.71* 3.18* 2.85*  CALCIUM 8.5* 8.9 8.3*  PHOS 2.2* 3.3 4.1   Liver Function Tests:  Recent Labs Lab  09/04/14 0413  09/07/14 0445  09/08/14 0430 09/09/14 0400 09/10/14 0525  AST 26  --  31  --   --   --   --   ALT 10*  --  12*  --   --   --   --   ALKPHOS 68  --  79  --   --   --   --   BILITOT 1.1  --  0.8  --   --   --   --   PROT 6.7  --  6.7  --   --   --   --   ALBUMIN 2.7*  < > 2.8*  < > 2.7* 2.7* 2.4*  < > = values in this interval not displayed. No results for input(s): LIPASE, AMYLASE in the last 168 hours. No results for input(s): AMMONIA in the last 168 hours. CBC:  Recent Labs Lab 09/06/14 0335 09/07/14 0445 09/08/14 0430 09/09/14 0400 09/10/14 0525  WBC 7.1 7.1 7.2 11.8* 11.4*  HGB 10.0* 9.3* 9.7* 9.3* 8.2*  HCT 32.0* 30.6* 31.1* 28.8* 26.3*  MCV 99.4 99.0 100.3* 97.6 97.0  PLT 235 203 191 216 187   Cardiac Enzymes: No results for input(s): CKTOTAL, CKMB, CKMBINDEX, TROPONINI  in the last 168 hours. CBG:  Recent Labs Lab 09/08/14 2211 09/09/14 0724 09/09/14 1104 09/09/14 1620 09/09/14 2348  GLUCAP 164* 122* 110* 109* 93    Iron Studies:   Recent Labs  09/10/14 0525  IRON 14*  TIBC 232*  FERRITIN 170   Studies/Results: No results found. Medications: Infusions: . sodium chloride 10 mL/hr at 08/29/14 0543  . DOBUTamine 2.5 mcg/kg/min (09/09/14 1900)  . heparin 1,600 Units/hr (09/10/14 0332)  . sodium chloride irrigation      Scheduled Medications: . sodium chloride   Intravenous Once  . sodium chloride  10 mL/hr Intravenous Once  . amiodarone  200 mg Oral BID  . atorvastatin  40 mg Oral q1800  . chlorhexidine  15 mL Mouth/Throat BID  . darbepoetin (ARANESP) injection - DIALYSIS  100 mcg Intravenous Q Mon-HD  . DULoxetine  30 mg Oral Daily  . feeding supplement (NEPRO CARB STEADY)  237 mL Oral BID BM  . insulin aspart  0-9 Units Subcutaneous TID AC & HS  . insulin glargine  5 Units Subcutaneous QHS  . levothyroxine  75 mcg Oral QAC breakfast  . sildenafil  80 mg Oral TID  . sodium chloride irrigation  200 mL Irrigation Q2H while  awake  . Warfarin - Pharmacist Dosing Inpatient   Does not apply q1800    have reviewed scheduled and prn medications.  Physical Exam: General: obese- slightly confused Heart: RRR Lungs: decreased BS at bases Abdomen: distended Extremities: pitting edema to dependent areas Dialysis Access: right IJ vascath placed 7/23 - approaching need for more permanent access later this week if still needed   09/10/2014,7:32 AM  LOS: 16 days

## 2014-09-10 NOTE — Progress Notes (Signed)
Physical Therapy Treatment Patient Details Name: Larry [name] MRN: [id number] DOB: 1951/02/06 Today's Date: 09/10/2014    History of Present Illness Larry [name] is a 64 y.o. male who complains of left hip pain after sustaining a mechanical fall at home. Pt with left intertrochanteric fx s/p IM nail. PMHx NICM, CAD, DDD, back pain, CVA, obesity. 7/23 CRRT initiated, 7/31 CRRT discontinued.    PT Comments    Patient was alert and agreeable to participate in PT session today. CRRT recently discontinued, so patient was able to get OOB, stand, and take a few steps. He reported no dizziness or pain, only c/o general B LE soreness. Patient will benefit greatly from continued PT to address ambulation deficits, decreased transfer ability and decreased endurance. Previous 4/4 goals unmet due to patient on bed rest, revised today. Frequency changed to 5x/week since pt is able to get OOB and is making progress.  Follow Up Recommendations        Equipment Recommendations  Rolling walker with 5" wheels    Recommendations for Other Services       Precautions / Restrictions Precautions Precautions: Fall Restrictions Weight Bearing Restrictions: Yes LLE Weight Bearing: Weight bearing as tolerated    Mobility  Bed Mobility Overal bed mobility: Needs Assistance Bed Mobility: Supine to Sit     Supine to sit: Min assist;+2 for safety/equipment     General bed mobility comments: Patient needed cues to perform bed mobility. It has been a while since he has been OOB.  Transfers Overall transfer level: Needs assistance Equipment used: Rolling walker (2 wheeled) Transfers: Sit to/from Stand Sit to Stand: From elevated surface;Min assist         General transfer comment: Cues for hand placement needed, patient felt slightly dizzy upon standing up.  Ambulation/Gait Ambulation/Gait assistance: Modified independent (Device/Increase time);+2 safety/equipment Ambulation Distance (Feet): 5  Feet Assistive device: Rolling walker (2 wheeled) Gait Pattern/deviations: Step-to pattern;Decreased stride length;Narrow base of support;Shuffle   Gait velocity interpretation: <1.8 ft/sec, indicative of risk for recurrent falls General Gait Details: Was only able to take a few steps per patient report of fatigue.   Stairs            Wheelchair Mobility    Modified Rankin (Stroke Patients Only)       Balance Overall balance assessment: Needs assistance;History of Falls Sitting-balance support: Bilateral upper extremity supported;Feet supported Sitting balance-Leahy Scale: Fair Sitting balance - Comments: Was able to balance better in sitting after adjustment to the position.   Standing balance support: Bilateral upper extremity supported Standing balance-Leahy Scale: Fair Standing balance comment: Able to shift weight onto and off of involved leg.                    Cognition Arousal/Alertness: Awake/alert Behavior During Therapy: WFL for tasks assessed/performed Overall Cognitive Status: Within Functional Limits for tasks assessed                 General Comments: Cognition was appropriate for all tasks assessed.    Exercises General Exercises - Upper Extremity Shoulder Flexion: AROM;10 reps;Both;Seated General Exercises - Lower Extremity Ankle Circles/Pumps: AROM;20 reps;Both;Seated;Supine Long Arc Quad: AROM;Both;10 reps;Seated Heel Slides: AAROM;Both;10 reps;Supine Hip Flexion/Marching: AROM;Both;10 reps;Seated    General Comments        Pertinent Vitals/Pain Pain Assessment: No/denies pain (c/o general LE soreness, but no pain.)  BP at rest = 94/45,  BP seated = 84/50,  BP after transfer, sitting in chair = 96/45  Home Living                      Prior Function            PT Goals (current goals can now be found in the care plan section) Acute Rehab PT Goals Patient Stated Goal: return home able to care for himself PT  Goal Formulation: With patient/family Time For Goal Achievement: 09/24/14 Potential to Achieve Goals: Good Progress towards PT goals: Progressing toward goals    Frequency  Min 5X/week    PT Plan Current plan remains appropriate    Co-evaluation             End of Session Equipment Utilized During Treatment: Gait belt;Oxygen Activity Tolerance: Patient limited by fatigue;No increased pain Patient left: in chair;with call bell/phone within reach;with family/visitor present     Time: 0935-1010 PT Time Calculation (min) (ACUTE ONLY): 35 min  Charges:  $Therapeutic Exercise: 8-22 mins $Therapeutic Activity: 8-22 mins                    G CodesRoanna Epley, SPT [phone]  09/10/2014, 11:07 AM

## 2014-09-10 NOTE — Progress Notes (Addendum)
ANTICOAGULATION CONSULT NOTE - Follow Up Consult  Pharmacy Consult for Heparin and Coumadin Indication: atrial fibrillation  Allergies  Allergen Reactions  . Bydureon [Exenatide] Other (See Comments)    sweating  . Losartan Potassium Other (See Comments)    insomnia    Patient Measurements: Height: 5\' 10"  (177.8 cm) Weight: 220 lb 7.4 oz (100 kg) IBW/kg (Calculated) : 73 Heparin Dosing Weight: 96kg  Vital Signs: Temp: 98 F (36.7 C) (08/02 1222) Temp Source: Oral (08/02 1222) BP: 95/48 mmHg (08/02 1222) Pulse Rate: 71 (08/02 1222)  Labs:  Recent Labs  09/08/14 0430 09/08/14 1525 09/09/14 0214 09/09/14 0400 09/09/14 1151 09/09/14 1152 09/09/14 2100 09/10/14 0525  HGB 9.7*  --   --  9.3*  --   --   --  8.2*  HCT 31.1*  --   --  28.8*  --   --   --  26.3*  PLT 191  --   --  216  --   --   --  187  APTT >200* 40* 50*  --   --   --   --   --   LABPROT  --   --   --   --  15.6*  --   --  16.1*  INR  --   --   --   --  1.22  --   --  1.27  HEPARINUNFRC 0.95*  --   --  <0.10*  --  <0.10* 0.10* 0.36  CREATININE 1.71*  --   --  3.18*  --   --   --  2.85*    Estimated Creatinine Clearance: 31.9 mL/min (by C-G formula based on Cr of 2.85).  Medications:  Heparin @ 1600 units/hr  Assessment: 64yom on xarelto pta for afib, transitioned to IV heparin on admit since he needed CRRT. Heparin level was therapeutic this morning, confirmatory level is pending. He also started coumadin yesterday. INR remains below goal as expected at 1.27 after first dose. Slight drop in Hgb noted 9.3-->8.2, platelets ok. No bleeding reported.  Goal of Therapy:  Heparin level 0.3-0.7 units/ml Monitor platelets by anticoagulation protocol: Yes   Plan:  1) Follow up confirmatory heparin level 2) Repeat coumadin 7.5mg  x 1 3) INR, CBC in AM  Markle, Benjamine Sprague 09/10/2014,2:31 PM  _________________________ Addendum:   Confirmatory level around 1600 was < 0.1. Spoke with RN who said heparin  has been running without any interruptions. Will give bolus of 3000 units and increase the infusion to 1900 units/hr. F/U level at [place], PharmD, [place] Pharmacist Pager [phone] 09/10/2014 5:17 PM

## 2014-09-11 ENCOUNTER — Encounter (HOSPITAL_COMMUNITY): Payer: Self-pay

## 2014-09-11 DIAGNOSIS — L8990 Pressure ulcer of unspecified site, unspecified stage: Secondary | ICD-10-CM | POA: Insufficient documentation

## 2014-09-11 LAB — CBC
HCT: 26.1 % — ABNORMAL LOW (ref 39.0–52.0)
Hemoglobin: 8.1 g/dL — ABNORMAL LOW (ref 13.0–17.0)
MCH: 30.2 pg (ref 26.0–34.0)
MCHC: 31 g/dL (ref 30.0–36.0)
MCV: 97.4 fL (ref 78.0–100.0)
Platelets: 202 10*3/uL (ref 150–400)
RBC: 2.68 MIL/uL — ABNORMAL LOW (ref 4.22–5.81)
RDW: 16.2 % — ABNORMAL HIGH (ref 11.5–15.5)
WBC: 9.2 10*3/uL (ref 4.0–10.5)

## 2014-09-11 LAB — CARBOXYHEMOGLOBIN
Carboxyhemoglobin: 1.2 % (ref 0.5–1.5)
Methemoglobin: 0.9 % (ref 0.0–1.5)
O2 Saturation: 51.3 %
Total hemoglobin: 10.9 g/dL — ABNORMAL LOW (ref 13.5–18.0)

## 2014-09-11 LAB — HEPATITIS B CORE ANTIBODY, TOTAL: Hep B Core Total Ab: NEGATIVE

## 2014-09-11 LAB — RENAL FUNCTION PANEL
ALBUMIN: 2.4 g/dL — AB (ref 3.5–5.0)
Anion gap: 8 (ref 5–15)
BUN: 31 mg/dL — ABNORMAL HIGH (ref 6–20)
CO2: 27 mmol/L (ref 22–32)
CREATININE: 3.94 mg/dL — AB (ref 0.61–1.24)
Calcium: 8.6 mg/dL — ABNORMAL LOW (ref 8.9–10.3)
Chloride: 96 mmol/L — ABNORMAL LOW (ref 101–111)
GFR calc Af Amer: 17 mL/min — ABNORMAL LOW (ref >60)
GFR calc non Af Amer: 15 mL/min — ABNORMAL LOW (ref >60)
Glucose, Bld: 126 mg/dL — ABNORMAL HIGH (ref 65–99)
PHOSPHORUS: 5.2 mg/dL — AB (ref 2.5–4.6)
POTASSIUM: 4.3 mmol/L (ref 3.5–5.1)
SODIUM: 131 mmol/L — AB (ref 135–145)

## 2014-09-11 LAB — PROTIME-INR
INR: 1.47 (ref 0.00–1.49)
Prothrombin Time: 17.9 seconds — ABNORMAL HIGH (ref 11.6–15.2)

## 2014-09-11 LAB — GLUCOSE, CAPILLARY
GLUCOSE-CAPILLARY: 126 mg/dL — AB (ref 65–99)
GLUCOSE-CAPILLARY: 136 mg/dL — AB (ref 65–99)
Glucose-Capillary: 134 mg/dL — ABNORMAL HIGH (ref 65–99)
Glucose-Capillary: 132 mg/dL — ABNORMAL HIGH (ref 65–99)
Glucose-Capillary: 119 mg/dL — ABNORMAL HIGH (ref 65–99)

## 2014-09-11 LAB — HEPATITIS B SURFACE ANTIBODY,QUALITATIVE: Hep B S Ab: NONREACTIVE

## 2014-09-11 LAB — HEPARIN LEVEL (UNFRACTIONATED)
HEPARIN UNFRACTIONATED: 0.5 [IU]/mL (ref 0.30–0.70)
HEPARIN UNFRACTIONATED: 0.74 [IU]/mL — AB (ref 0.30–0.70)
Heparin Unfractionated: 0.75 IU/mL — ABNORMAL HIGH (ref 0.30–0.70)

## 2014-09-11 LAB — MAGNESIUM: Magnesium: 2.3 mg/dL (ref 1.7–2.4)

## 2014-09-11 MED ORDER — POLYETHYLENE GLYCOL 3350 17 G PO PACK
17.0000 g | PACK | Freq: Every day | ORAL | Status: DC
  Administered 2014-09-11 – 2014-09-12 (×2): 17 g via ORAL
  Filled 2014-09-11 (×2): qty 1

## 2014-09-11 MED ORDER — HEPARIN (PORCINE) IN NACL 100-0.45 UNIT/ML-% IJ SOLN
1800.0000 [IU]/h | INTRAMUSCULAR | Status: DC
  Administered 2014-09-11 – 2014-09-13 (×3): 1800 [IU]/h via INTRAVENOUS
  Filled 2014-09-11 (×7): qty 250

## 2014-09-11 MED ORDER — FUROSEMIDE 10 MG/ML IJ SOLN
160.0000 mg | Freq: Four times a day (QID) | INTRAVENOUS | Status: DC
  Administered 2014-09-11 – 2014-09-12 (×3): 160 mg via INTRAVENOUS
  Filled 2014-09-11 (×6): qty 16

## 2014-09-11 NOTE — Progress Notes (Signed)
ANTICOAGULATION CONSULT NOTE - Follow Up Consult  Pharmacy Consult for heparin Indication: atrial fibrillation   Labs:  Recent Labs  09/08/14 1525 09/09/14 0214  09/09/14 0400 09/09/14 1151  09/10/14 0525 09/10/14 1615 09/11/14 0015 09/11/14 0452 09/11/14 0615  HGB  --   --   < > 9.3*  --   --  8.2*  --   --  8.1*  --   HCT  --   --   --  28.8*  --   --  26.3*  --   --  26.1*  --   PLT  --   --   --  216  --   --  187  --   --  202  --   APTT 40* 50*  --   --   --   --   --   --   --   --   --   LABPROT  --   --   --   --  15.6*  --  16.1*  --   --   --  17.9*  INR  --   --   --   --  1.22  --  1.27  --   --   --  1.47  HEPARINUNFRC  --   --   --  <0.10*  --   < > 0.36 <0.10* 0.75*  --  0.50  CREATININE  --   --   --  3.18*  --   --  2.85*  --   --  3.94*  --   < > = values in this interval not displayed.    Assessment/Plan:  64yo male therapeutic on heparin after rate change. Will continue gtt at current rate and confirm stable with additional level.   Wynona Neat, PharmD, BCPS  09/11/2014,6:59 AM

## 2014-09-11 NOTE — Progress Notes (Signed)
09/11/14 Per Dr Hillery Hunter (renal) can use pigtail on hemodialysis cath for fluid. Rico Sheehan RN

## 2014-09-11 NOTE — Progress Notes (Addendum)
09/11/2014 Bladder scan was done at 10am and patient had 12cc in bladder. It was rechecked at 1645 and it read zero. Dr Tyrell Antonio is aware. [facility] RN.

## 2014-09-11 NOTE — Progress Notes (Signed)
Patient has been transferred to [facility] from [facility].  Written and oral handoff will be provided to receiving unit CSW.  Patient is from home with his wife- on chronic Milrinone. Required short term SNF due to recent Fx hip but has experienced multiple medical issues during hospitalization. Now receiving dialysis- unsure if he will need long term dialysis or not.  SNF search had been initiated earlier in hospital stay due to need for daily PT and chronic Milrinone. Will need to insure that patient can transport via chair to out patient dialysis if this is going to be chronic prior to SNF placement.  CSW services will continue to monitor for needs.  [name]. [name], [place]

## 2014-09-11 NOTE — Progress Notes (Signed)
Physical Therapy Treatment Patient Details Name: Larry [name] MRN: [id number] DOB: 02/26/1950 Today's Date: 09/11/2014    History of Present Illness Larry [name] is a 64 y.o. male who complains of left hip pain after sustaining a mechanical fall at home. Pt with left intertrochanteric fx s/p IM nail. PMHx NICM, CAD, DDD, back pain, CVA, obesity. 7/23 CRRT initiated, 7/31 CRRT discontinued.    PT Comments    Patient was eager and agreeable to participation in PT today. He performed bed and chair exercises as well as ambulated as described below. He was much improved in his general ROM and exercise tolerance compared to yesterday. Patient will benefit from continued PT to address endurance limitations as well as strength and ROM deficits.   Follow Up Recommendations        Equipment Recommendations  Rolling walker with 5" wheels    Recommendations for Other Services       Precautions / Restrictions Precautions Precautions: Fall Restrictions Weight Bearing Restrictions: Yes LLE Weight Bearing: Weight bearing as tolerated    Mobility  Bed Mobility Overal bed mobility: Needs Assistance Bed Mobility: Supine to Sit     Supine to sit: Min guard;+2 for safety/equipment (Pt drastically improved from yesterday.)     General bed mobility comments: Patient was able to remember steps of bed mobility and was able to almost completely pull himself up to sitting using the bed rails. He has multiple lines and leads that require thorough management.  Transfers Overall transfer level: Needs assistance Equipment used: Rolling walker (2 wheeled) Transfers: Sit to/from Stand Sit to Stand: Min assist;From elevated surface         General transfer comment: Patient required 2 attempts to stand up.  Ambulation/Gait Ambulation/Gait assistance: Modified independent (Device/Increase time);+2 safety/equipment (Follow with chair, management of lines and leads) Ambulation Distance (Feet): 17  Feet Assistive device: Rolling walker (2 wheeled) Gait Pattern/deviations: Step-to pattern;Narrow base of support;Shuffle   Gait velocity interpretation: <1.8 ft/sec, indicative of risk for recurrent falls General Gait Details: Was able to walk to the door before patient reported fatiguing.   Stairs            Wheelchair Mobility    Modified Rankin (Stroke Patients Only)       Balance Overall balance assessment: Modified Independent Sitting-balance support: Feet supported;Bilateral upper extremity supported Sitting balance-Leahy Scale: Fair Sitting balance - Comments: More stable than yesterdays session.   Standing balance support: Bilateral upper extremity supported Standing balance-Leahy Scale: Fair                      Cognition Arousal/Alertness: Awake/alert Behavior During Therapy: WFL for tasks assessed/performed Overall Cognitive Status: Within Functional Limits for tasks assessed                      Exercises General Exercises - Lower Extremity Ankle Circles/Pumps: AROM;20 reps;Both;Supine Long Arc Quad: AROM;Both;10 reps;Seated Heel Slides: AROM;Both;10 reps;Supine (Didn't require assistance to perform today.) Hip Flexion/Marching: AROM;Both;10 reps;Seated Heel Raises: AROM;Both;15 reps;Seated    General Comments        Pertinent Vitals/Pain Pain Assessment: No/denies pain (c/o general leg soreness but no pain)    Home Living                      Prior Function            PT Goals (current goals can now be found in the care plan section) Acute  Rehab PT Goals Patient Stated Goal: return home able to care for himself PT Goal Formulation: With patient Time For Goal Achievement: 09/24/14 Potential to Achieve Goals: Good Progress towards PT goals: Progressing toward goals    Frequency  Min 5X/week    PT Plan Current plan remains appropriate    Co-evaluation             End of Session Equipment Utilized  During Treatment: Gait belt Activity Tolerance: Patient limited by fatigue;No increased pain Patient left: in chair;with call bell/phone within reach;with family/visitor present     Time: 1224-4975 PT Time Calculation (min) (ACUTE ONLY): 34 min  Charges:  $Gait Training: 8-22 mins $Therapeutic Exercise: 8-22 mins                    G CodesRoanna Epley, SPT [phone]  09/11/2014, 1:36 PM  I have read, reviewed and agree with student's note.   [place] [phone] (pager)

## 2014-09-11 NOTE — Progress Notes (Signed)
Patient ID: Larry [name], male   DOB: 01/02/1951, 64 y.o.   MRN: [id number] Advanced Heart Failure Rounding Note   Subjective:    [name] is a 64 y/o man with history of chronic systolic CHF (nonischemic cardiomyopathy) s/p 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 He is on chronic milrinone 0.5 mcg.  Prior to admit he was being considered for LVAD.   Dischaged 7/16 after dental procedures. Also had RHC with elevate pulmonary pressures. Revatio was increased to 80 mg tid during that stay.  Admitted with fall---> broken L  hip . 7/18 had IM Nail L femur.  7/23 started CVVHD, CVVH stopped 7/31.   Hypotensive 7/28, stopped milrinone and started norepinephrine.  Pressure recovered, now off norepinephrine and on dobutamine 2.5 mcg/kg/min.  BP stable today.  He had HD on 8/1, tolerated it well in terms of BP.  Minimal UOP now on IV Lasix.  CVP up to 20 today.  Denies dyspnea at rest.  Walked short distance today with PT.    Objective:   Weight Range:  Vital Signs:   Temp:  [97.3 F (36.3 C)-98.1 F (36.7 C)] 97.3 F (36.3 C) (08/03 0817) Pulse Rate:  [62-71] 66 (08/03 0817) Resp:  [12-18] 13 (08/03 0817) BP: (92-107)/(38-59) 96/43 mmHg (08/03 0817) SpO2:  [97 %-100 %] 99 % (08/03 0817) Weight:  [225 lb 1.4 oz (102.1 kg)] 225 lb 1.4 oz (102.1 kg) (08/03 0446) Last BM Date: 09/09/14  Weight change: Filed Weights   09/09/14 2315 09/10/14 0500 09/11/14 0446  Weight: 219 lb 9.3 oz (99.6 kg) 220 lb 7.4 oz (100 kg) 225 lb 1.4 oz (102.1 kg)    Intake/Output:   Intake/Output Summary (Last 24 hours) at 09/11/14 1103 Last data filed at 09/11/14 0600  Gross per 24 hour  Intake 1099.4 ml  Output     95 ml  Net 1004.4 ml     Physical Exam: General:  NAD  HEENT: normal Neck: supple. JVP 12 cm. RIJ trialysis catheter.  Carotids 2+ bilat; no bruits. No lymphadenopathy or thryomegaly appreciated. Cor:  PMI nondisplaced. Regular rate & rhythm. No rubs, gallops or murmurs. Lungs: Diminished both bases with mild crackles Abdomen: soft, nontender, nondistended. No hepatosplenomegaly. No bruits or masses. Good bowel sounds. Extremities: no cyanosis, clubbing, rash. RUE PICC dual lumen . R Thigh lateral aspect dressing intact x2. 1+ edema to knees bilaterally. Neuro: sleepy but awakens  Telemetry: NSR   Labs: Basic Metabolic Panel:  Recent Labs Lab 09/07/14 0445 09/07/14 1750 09/08/14 0430 09/09/14 0400 09/10/14 0525 09/11/14 0452  NA 132* 133* 133* 128* 134* 131*  K 4.1 4.3 4.4 4.7 4.4 4.3  CL 100* 99* 99* 98* 97* 96*  CO2 26 28 27 24 28 27   GLUCOSE 147* 129* 126* 135* 99 126*  BUN 7 6 7 20 18  31*  CREATININE 1.74* 1.72* 1.71* 3.18* 2.85* 3.94*  CALCIUM 8.3* 8.5* 8.5* 8.9 8.3* 8.6*  MG 2.3  --  2.3 2.3 2.2 2.3  PHOS 2.4* 2.4* 2.2* 3.3 4.1 5.2*    Liver Function Tests:  Recent Labs Lab 09/07/14 0445 09/07/14 1750 09/08/14 0430 09/09/14 0400 09/10/14 0525 09/11/14 0452  AST 31  --   --   --   --   --   ALT 12*  --   --   --   --   --   ALKPHOS 79  --   --   --   --   --  BILITOT 0.8  --   --   --   --   --   PROT 6.7  --   --   --   --   --   ALBUMIN 2.8* 2.7* 2.7* 2.7* 2.4* 2.4*   No results for input(s): LIPASE, AMYLASE in the last 168 hours. No results for input(s): AMMONIA in the last 168 hours.  CBC:  Recent Labs Lab 09/07/14 0445 09/08/14 0430 09/09/14 0400 09/10/14 0525 09/11/14 0452  WBC 7.1 7.2 11.8* 11.4* 9.2  HGB 9.3* 9.7* 9.3* 8.2* 8.1*  HCT 30.6* 31.1* 28.8* 26.3* 26.1*  MCV 99.0 100.3* 97.6 97.0 97.4  PLT 203 191 216 187 202    Cardiac Enzymes: No results for input(s): CKTOTAL, CKMB, CKMBINDEX, TROPONINI in the last 168 hours.  BNP: BNP (last 3 results)  Recent Labs  05/22/14 1029 05/27/14 1126 06/19/14 1030  BNP 935.2* 1627.8* 738.9*    ProBNP (last 3 results)  Recent Labs  10/09/13 0926 11/14/13 0926 01/14/14 1112   PROBNP 1561.0* 1261.0* 5769.0*      Other results:  Imaging: No results found.   Medications:     Scheduled Medications: . sodium chloride   Intravenous Once  . sodium chloride  10 mL/hr Intravenous Once  . amiodarone  200 mg Oral BID  . atorvastatin  40 mg Oral q1800  . cefTRIAXone (ROCEPHIN)  IV  1 g Intravenous Q24H  . chlorhexidine  15 mL Mouth/Throat BID  . darbepoetin (ARANESP) injection - DIALYSIS  100 mcg Intravenous Q Mon-HD  . DULoxetine  30 mg Oral Daily  . feeding supplement (NEPRO CARB STEADY)  237 mL Oral BID BM  . ferric gluconate (FERRLECIT/NULECIT) IV  125 mg Intravenous Daily  . furosemide  160 mg Intravenous Q6H  . insulin aspart  0-9 Units Subcutaneous TID AC & HS  . insulin glargine  5 Units Subcutaneous QHS  . levothyroxine  75 mcg Oral QAC breakfast  . sildenafil  80 mg Oral TID  . sodium chloride irrigation  200 mL Irrigation Q2H while awake    Infusions: . sodium chloride 10 mL/hr at 08/29/14 0543  . DOBUTamine 2.5 mcg/kg/min (09/09/14 1900)  . heparin 1,900 Units/hr (09/11/14 0734)  . sodium chloride irrigation      PRN Medications: Place/Maintain arterial line **AND** sodium chloride, albuterol, ALPRAZolam, morphine injection, ondansetron (ZOFRAN) IV, oxyCODONE-acetaminophen **AND** oxyCODONE, sodium chloride, sodium chloride, temazepam   Assessment:  1. Left Hip Fracture s/p IM nail on 08/26/14 2. Chronic systolic CHF- Nonischemic cardiomyopathy, on home milrinone 0.5 mcg/kg/min, decreased to 0.25 here with NSVT. Became hypotensive, now on dobutamine 2.5.  3. Acute on chronic renal failure - suspect post-op ATN.  CVVH => HD.  4.  Paroxysmal atrial fibrillation requiring DCCV 02/2014, 04/2014- on chronic amio and xarelto. CHADS VASc Score- 5.  Remains in NSR.  5. Diabetes mellitus type 2 6. Depression/anxiety 7. OSA, complex- Needs Bipap at night but had difficulty after dental procedure.  8 Pulmonary HTN - Mixed pulmonary venous  hypertension and PAH- on Revatio 80 mg tid 9. Dental extractions 08/2014 by Dr Enrique Sack 10. Obesity- Body mass index is 32.39 kg/(m^2). 11. Probable chronic respiratory failure - requiring home O2 with ambulation as of this admission 12. Hyponatremia   13. NSVT 14. Delirium- Improved.   Plan/Discussion:    Suspect post-op ATN, oliguric.  Now getting HD (off CVVH).  He tolerated well 8/1, plan HD again tomorrow.  Very minimal UOP despite IV Lasix.  CVP rising.  Leaving foley in to continue to monitor for renal recovery.  He will continue Lasix at 160 mg IV every 6 hrs.   Switched over now to dobutamine, think this will be safer than milrinone with intermittent HD.  Co-ox has been ok.    Revatio for pulmonary hypertension.   Continue amiodarone 200 mg bid.  Sinus rhythm with occasional PVCs, no atrial fibrillation. Long PR interval noted on EKG. Continue amiodarone. Off Metoprolol XL with soft BP. Off Xarelto due to renal failure. Now on heparin gtt => restart warfarin after he gets his permanent vascular access later in the week.   Continue PT/OT.   Length of Stay: 17  Dalton McLean,MD 11:03 AM Advanced Heart Failure Team Pager [phone] (M-F; 7a - 4p)  Please contact [facility] for night-coverage after hours (4p -7a ) and weekends on [email or web address]

## 2014-09-11 NOTE — Progress Notes (Signed)
Occupational Therapy Treatment Patient Details Name: Larry [name] MRN: [id number] DOB: [birth year] Today's Date: 09/11/2014    History of present illness Larry [name] is a 64 y.o. male who complains of left hip pain after sustaining a mechanical fall at home. Pt with left intertrochanteric fx s/p IM nail. PMHx NICM, CAD, DDD, back pain, CVA, obesity. 7/23 CRRT initiated, 7/31 CRRT discontinued.   OT comments  Pt fatigued after 3+ hours in chair, ready for return to bed.  Addressed toileting. Pt with B UE dependence on walker, requiring total assist to manage gown and for pericare. Pt is moving remarkably well for the amount of time he was immobile while on CRRT.  Follow Up Recommendations  SNF;Supervision/Assistance - 24 hour    Equipment Recommendations       Recommendations for Other Services      Precautions / Restrictions Precautions Precautions: Fall Restrictions LLE Weight Bearing: Weight bearing as tolerated       Mobility Bed Mobility Overal bed mobility: Needs Assistance Bed Mobility: Sit to Supine       Sit to supine: Mod assist   General bed mobility comments: assist for LEs up in bed, verbal cues for log roll technique to protect back  Transfers   Equipment used: Rolling walker (2 wheeled) Transfers: Sit to/from [facility] Sit to Stand: Min assist;+2 physical assistance (from recliner) Stand pivot transfers: +2 physical assistance;Min guard       General transfer comment: verbal cues for hand placement and to rise from recliner    Balance Overall balance assessment: Needs assistance   Sitting balance-Leahy Scale: Fair       Standing balance-Leahy Scale: Poor Standing balance comment: reliant in both hands on walker                   ADL Overall ADL's : Needs assistance/impaired                     Lower Body Dressing: Total assistance;Sitting/lateral leans Lower Body Dressing Details (indicate cue type and  reason): doffed socks Toilet Transfer: +2 for physical assistance;Min guard;Stand-pivot;BSC   Toileting- Clothing Manipulation and Hygiene: Total assistance;Sit to/from stand Toileting - Clothing Manipulation Details (indicate cue type and reason): pt unable to release walker for UE use       General ADL Comments: Pt fatigued after being up in chair x 3 1/2 hours and toileting.      Vision                     Perception     Praxis      Cognition   Behavior During Therapy: WFL for tasks assessed/performed Overall Cognitive Status: Impaired/Different from baseline Area of Impairment: Memory     Memory: Decreased short-term memory               Extremity/Trunk Assessment               Exercises     Shoulder Instructions       General Comments      Pertinent Vitals/ Pain       Pain Assessment: Faces Faces Pain Scale: Hurts even more Pain Location: back--chronic Pain Descriptors / Indicators: Aching Pain Intervention(s): Monitored during session;Repositioned  Home Living  Prior Functioning/Environment              Frequency Min 2X/week     Progress Toward Goals  OT Goals(current goals can now be found in the care plan section)  Progress towards OT goals: Progressing toward goals  Acute Rehab OT Goals Patient Stated Goal: return home able to care for himself Potential to Achieve Goals: Good  Plan Discharge plan remains appropriate    Co-evaluation                 End of Session Equipment Utilized During Treatment: Rolling walker;Gait belt   Activity Tolerance Patient limited by fatigue   Patient Left in bed;with call bell/phone within reach;with family/visitor present;with nursing/sitter in room   Nurse Communication          Time: 4562-5638 OT Time Calculation (min): 24 min  Charges: OT General Charges $OT Visit: 1 Procedure OT Treatments $Self  Care/Home Management : 23-37 mins  [name] 09/11/2014, 3:58 PM  [phone]

## 2014-09-11 NOTE — Progress Notes (Signed)
Initial Nutrition Assessment  DOCUMENTATION CODES:   Obesity unspecified  INTERVENTION:   No nutrition intervention at this time --- patient declined  NUTRITION DIAGNOSIS:   Increased nutrient needs related to wound healing as evidenced by estimated needs  GOAL:   Patient will meet greater than or equal to 90% of their needs  MONITOR:   PO intake, Labs, Weight trends, I & O's, Skin  REASON FOR ASSESSMENT:   Consult Assessment of nutrition requirement/status  ASSESSMENT:   64 y.o. Male who complains of left hip pain after sustaining a mechanical fall at home this morning. He presented to the ER for evaluation after being unable to ambulate without pain. Prior to the fall, the patient was abulatory without any assistive devices.   Pt reports his appetite is doing fine.  Ate lunch well today.  PO intake 95% per flowsheet records.  No reported weight loss.  RD offered oral nutrition supplements, however, pt politely declined.  Nutrition focused physical exam completed.  No muscle or subcutaneous fat depletion noticed.  Diet Order:  Diet 2 gram sodium Room service appropriate?: Yes; Fluid consistency:: Thin; Fluid restriction:: 1800 mL Fluid  Skin:  Wound (see comment) (Stage II to sacrum)  Last BM:  8/1  Height:   Ht Readings from Last 1 Encounters:  09/09/14 5\' 10"  (1.778 m)    Weight:   Wt Readings from Last 1 Encounters:  09/11/14 225 lb 1.4 oz (102.1 kg)    Ideal Body Weight:  75.4 kg  BMI:  Body mass index is 32.3 kg/(m^2).  Estimated Nutritional Needs:   Kcal:  2000-2200  Protein:  105-115 gm  Fluid:  2.0-2.2 L  EDUCATION NEEDS:   No education needs identified at this time  Arthur Holms, RD, LDN Pager #: [phone] After-Hours Pager #: [phone]

## 2014-09-11 NOTE — Progress Notes (Addendum)
09/11/2014 Patient wife had concern about edema on patient lower leg. Wife spoke to primary care. Per Dr Tyrell Antonio  Monitor respiration and saturation, let renal MD aware of any changes. [facility] RN.

## 2014-09-11 NOTE — Progress Notes (Addendum)
[facility] PROGRESS NOTE  ANTUAN LIMES XTK:[id number] DOB: [birth year] DOA: 08/25/2014 PCP: [name], MD   Interim summary 64 y/o man with history of chronic systolic CHF (nonischemic cardiomyopathy) s/p 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 He is on chronic milrinone 0.5 mcg. Admitted with fall---> broken L hip . 7/18 had IM Nail L femur. Course complicated with fluid overload and acute on chronic renal failure (due to ATN most liekely). Required CVVHD 7/23>> 7/31. Now on intermittent HD and being assessed for ESRD and HD dependency .  Assessment/Plan: 1. Severe nonischemic cardiomyopathy with chronic systolic heart failure with an EF of 25%. History of V. tach. Patient has AICD, on home milrinone drip. Experienced hypotension requiring norepinephrine and now on dobutamine; plan is to continue dobutamine instead of milrinone. -Patient was started on CVVHD on 08/31/2014 until 09/08/2014 -Cardiology managing HF meds -He tolerated CVVHD; but has remained oliguric and was initiated on IHD (8/1) -2L removed during HD on 8/1 -weight 100 Kg ---102.  -started on lasix by renal.   2.  Acute on chronic renal failure -Xarelto was discontinued and started on IV heparin -Nephrology was consulted, feeling renal failure likely represents postoperative ATN/hypoperfusion from hypotension -CVVH was started on 08/31/2014 and stopped on 7/31 -still oliguric.  -Nephrology managing  -plan is to observed and continue HD intermittently as needed last treatment on 8/1  -most likely will ended on permanent HD -if renal function recovers, patient might need Flomax prior to discontinuation of foley catheter.   3. Constipation -Patient w/o diarrhea; reports BM are ok now. -last BM 8/1  4.  Hyponatremia -I suspect secondary to volume overload and renal failure -Improved after HD -follow trend.   5.  Mechanical trip and fall with left femoral intertrochanteric fracture.  -status post intramedullary nail to left hip on 7/18 -WBAT -PT/OT evaluation -Plan for discharge to SNF when medically stable  6. Paroxysmal atrial fibrillation and pulmonary HTN.  -Xarelto discontinued due to worsening kidney function, now on IV heparin and with plans for coumadin eventually -On Amio -continue revatio for pulm HTN -hold coumadin in case he required permanent access for dialysis. Discussed with renal.   7. Obstructive sleep apnea.  -Patient refusing BiPAP;   8. DM type II. recent A1c of 7.7 -lantus changed to 5 units along with SSI since 7/28, he had hypoglycemia on 7/27 -follow CBG's and adjust as needed -no further hypoglycemic events; eating ok  9. Insomnia: -will continue using restoril PRN   10-UTI; UA with too numerous to count WBC. Started on ceftriaxone 8-03. Follow urine culture.   Code Status: Full Family Communication: wife at bedside Disposition Plan: Patient in the process of been assessed for ESRD. On Dobutamine drip for heart failure.    Consultants:  Ortho (Dr. Percell Miller)  HF team   Nephrology  Procedures:  S/P intramedullary nail on left hip on 7/18  CVVH   Antibiotics:  None   HPI/Subjective: He is feeling ok, per wife there was small amount of blood from foley catheter. Wife asking for urology evaluation, patient with history of difficulty starting urine stream.  He is breathing ok.    Objective: Filed Vitals:   09/11/14 0600  BP: 102/51  Pulse: 62  Temp:   Resp: 12    Intake/Output Summary (Last 24 hours) at 09/11/14 0747 Last data filed at 09/11/14 0600  Gross per 24 hour  Intake 1682.6 ml  Output     95 ml  Net 1587.6 ml   Filed Weights   09/09/14 2315 09/10/14 0500 09/11/14 0446  Weight: 99.6 kg (219 lb 9.3 oz) 100 kg (220 lb 7.4 oz) 102.1 kg (225 lb 1.4 oz)    Exam:   General:  He is  alert, oriented X 3, follows commands,  chronically ill-appearing;   Cardiovascular:no rubs, no gallops, RRR  Respiratory: Normal respiratory effort, lungs are clear to auscultation bilaterally  Abdomen: soft, NT, ND, positive Bowel sounds  Musculoskeletal: Surgical incision site appears clean, no evidence of infection. Still with pedal edema.    Data Reviewed: Basic Metabolic Panel:  Recent Labs Lab 09/07/14 0445 09/07/14 1750 09/08/14 0430 09/09/14 0400 09/10/14 0525 09/11/14 0452  NA 132* 133* 133* 128* 134* 131*  K 4.1 4.3 4.4 4.7 4.4 4.3  CL 100* 99* 99* 98* 97* 96*  CO2 26 28 27 24 28 27   GLUCOSE 147* 129* 126* 135* 99 126*  BUN 7 6 7 20 18  31*  CREATININE 1.74* 1.72* 1.71* 3.18* 2.85* 3.94*  CALCIUM 8.3* 8.5* 8.5* 8.9 8.3* 8.6*  MG 2.3  --  2.3 2.3 2.2 2.3  PHOS 2.4* 2.4* 2.2* 3.3 4.1 5.2*   CBC:  Recent Labs Lab 09/07/14 0445 09/08/14 0430 09/09/14 0400 09/10/14 0525 09/11/14 0452  WBC 7.1 7.2 11.8* 11.4* 9.2  HGB 9.3* 9.7* 9.3* 8.2* 8.1*  HCT 30.6* 31.1* 28.8* 26.3* 26.1*  MCV 99.0 100.3* 97.6 97.0 97.4  PLT 203 191 216 187 202   BNP (last 3 results)  Recent Labs  05/22/14 1029 05/27/14 1126 06/19/14 1030  BNP 935.2* 1627.8* 738.9*    ProBNP (last 3 results)  Recent Labs  10/09/13 0926 11/14/13 0926 01/14/14 1112  PROBNP 1561.0* 1261.0* 5769.0*   CBG:  Recent Labs Lab 09/09/14 2348 09/10/14 0807 09/10/14 1223 09/10/14 1653 09/10/14 2135  GLUCAP 93 99 120* 118* 116*    Recent Results (from the past 240 hour(s))  Clostridium Difficile by PCR (not at [facility])     Status: None   Collection Time: 09/02/14 10:27 PM  Result Value Ref Range Status   Toxigenic C Difficile by pcr NEGATIVE NEGATIVE Final     Studies: No results found.  Scheduled Meds: . sodium chloride   Intravenous Once  . sodium chloride  10 mL/hr Intravenous Once  . amiodarone  200 mg Oral BID  . atorvastatin  40 mg Oral q1800  . cefTRIAXone (ROCEPHIN)  IV  1 g Intravenous Q24H  . chlorhexidine   15 mL Mouth/Throat BID  . darbepoetin (ARANESP) injection - DIALYSIS  100 mcg Intravenous Q Mon-HD  . DULoxetine  30 mg Oral Daily  . feeding supplement (NEPRO CARB STEADY)  237 mL Oral BID BM  . ferric gluconate (FERRLECIT/NULECIT) IV  125 mg Intravenous Daily  . furosemide  80 mg Intravenous Q6H  . insulin aspart  0-9 Units Subcutaneous TID AC & HS  . insulin glargine  5 Units Subcutaneous QHS  . levothyroxine  75 mcg Oral QAC breakfast  . sildenafil  80 mg Oral TID  . sodium chloride irrigation  200 mL Irrigation Q2H while awake  . Warfarin - Pharmacist Dosing Inpatient   Does not apply q1800   Continuous Infusions: . sodium chloride 10 mL/hr at 08/29/14 0543  . DOBUTamine 2.5 mcg/kg/min (09/09/14 1900)  . heparin 1,900 Units/hr (09/11/14 0734)  . sodium chloride irrigation      Principal Problem:   Fracture, intertrochanteric, left femur Active  Problems:   Essential hypertension, benign   Persistent atrial fibrillation   Automatic implantable cardioverter-defibrillator in situ   DM2 (diabetes mellitus, type 2)   OSA (obstructive sleep apnea), intolerant to CPAP/BIPAP   Ventricular tachycardia   Chronic systolic CHF (congestive heart failure)   Hip fracture   Left femoral shaft fracture   Acute on chronic renal failure   Hyponatremia   Acute on chronic systolic CHF (congestive heart failure)   Acute renal failure superimposed on stage 4 chronic kidney disease   SOB (shortness of breath)   Insomnia   Oliguria   Adjustment disorder with anxious mood   Chronic atrial fibrillation   Pressure ulcer   Time spent: 30 minutes   Cortavious Nix, [facility] Pager [phone]. If 7PM-7AM, please contact night-coverage at [email or web address], password [email or web address] 09/11/2014, 7:47 AM  LOS: 17 days     Came to see patient, family with concern of worsening LE edema.  Patient alert, no dyspnea, breathing well, RR at 14, sat 100 % 2 L.  Explain to wife that patient will likely need  dialysis 8-4, if he doesn't have significant urine out put. Patient will received another dose of lasix tonight.

## 2014-09-11 NOTE — Progress Notes (Signed)
09/10/2014 per Dr Hillery Hunter (Renal) can use pigtail on hemodialysis cath for fluid. [facility] RN.

## 2014-09-11 NOTE — Progress Notes (Signed)
ANTICOAGULATION CONSULT NOTE - Follow Up Consult  Pharmacy Consult for Heparin Indication: atrial fibrillation  Allergies  Allergen Reactions  . Bydureon [Exenatide] Other (See Comments)    sweating  . Losartan Potassium Other (See Comments)    insomnia    Patient Measurements: Height: 5\' 10"  (177.8 cm) Weight: 225 lb 1.4 oz (102.1 kg) IBW/kg (Calculated) : 73 Heparin Dosing Weight: 94kg  Vital Signs: Temp: 98.1 F (36.7 C) (08/03 1229) Temp Source: Oral (08/03 1229) BP: 103/49 mmHg (08/03 1229) Pulse Rate: 70 (08/03 1229)  Labs:  Recent Labs  09/08/14 1525 09/09/14 0214  09/09/14 0400 09/09/14 1151  09/10/14 0525  09/11/14 0015 09/11/14 0452 09/11/14 0615 09/11/14 1350  HGB  --   --   < > 9.3*  --   --  8.2*  --   --  8.1*  --   --   HCT  --   --   --  28.8*  --   --  26.3*  --   --  26.1*  --   --   PLT  --   --   --  216  --   --  187  --   --  202  --   --   APTT 40* 50*  --   --   --   --   --   --   --   --   --   --   LABPROT  --   --   --   --  15.6*  --  16.1*  --   --   --  17.9*  --   INR  --   --   --   --  1.22  --  1.27  --   --   --  1.47  --   HEPARINUNFRC  --   --   --  <0.10*  --   < > 0.36  < > 0.75*  --  0.50 0.74*  CREATININE  --   --   --  3.18*  --   --  2.85*  --   --  3.94*  --   --   < > = values in this interval not displayed.  Estimated Creatinine Clearance: 23.3 mL/min (by C-G formula based on Cr of 3.94).   Medications:  Heparin @ 1900 units/hr  Assessment: 64yom continues on heparin for afib. Confirmatory heparin level is slightly above goal. Coumadin was started 8/1 but has now been placed on hold as he will need permanent dialysis access placed later this week. Plan is to resume coumadin after, INR 1.47. Hemoglobin is low but stable. No bleeding reported.  Goal of Therapy:  Heparin level 0.3-0.7 units/ml Monitor platelets by anticoagulation protocol: Yes   Plan:  1) Decrease heparin to 1800 units/hr 2) Heparin level, CBC  in AM  Deboraha Sprang 09/11/2014,2:33 PM

## 2014-09-11 NOTE — Progress Notes (Signed)
Subjective:  Minimal UOP and creatinine up- but he looks and feels better- said he ate breakfast  Objective Vital signs in last 24 hours: Filed Vitals:   09/11/14 0200 09/11/14 0400 09/11/14 0446 09/11/14 0600  BP: 104/38 104/56  102/51  Pulse: 68 66  62  Temp:  98 F (36.7 C)    TempSrc:  Oral    Resp: 15 15  12   Height:      Weight:   102.1 kg (225 lb 1.4 oz)   SpO2: 97% 97%  100%   Weight change: 1.9 kg (4 lb 3 oz)  Intake/Output Summary (Last 24 hours) at 09/11/14 0815 Last data filed at 09/11/14 0600  Gross per 24 hour  Intake 1656.8 ml  Output     95 ml  Net 1561.8 ml    Assessment/ Plan: Pt is a 64 y.o. yo male with baseline nonischemic cardiomyopathy on milrinone with stage 3 CKD who was admitted on 08/25/2014 with s/p hip fx- had A on CRF - now HD requiring  Assessment/Plan: 1. Renal- CKD at baseline- with events became dialysis requiring- on CRRT from 7/23- 7/31. Had IHD 8/1. My plan is now to observe.  Still a reasonable chance could recover enough renal function to stay off of dialysis ? First 24 hours not that optimistic- I have told patient and wife that.  Vascath in since 7/23 so will need to be removed by end of week.  I will make determination tomorrow- feel like he will likely need HD and transition of vascath to [facility] so appreciate primary holding his coumadin 2. Anemia- hgb falling with events and heparin- repleting iron and have added aranesp 3. HTN/volume-   Given sodium probably overloaded- improved with HD.    Baseline cardiomyopathy is complicating situation but surprisingly blood pressure is reasonable.  Will increase  lasix and follow  4. S/p hip fx- operative repair on 7/19- planning on SNF eventually 5. Urine looks cloudy- will check U/A - looked like UTI- rocephin started   Sadee Osland A    Labs: Basic Metabolic Panel:  Recent Labs Lab 09/09/14 0400 09/10/14 0525 09/11/14 0452  NA 128* 134* 131*  K 4.7 4.4 4.3  CL 98* 97* 96*  CO2 24  28 27   GLUCOSE 135* 99 126*  BUN 20 18 31*  CREATININE 3.18* 2.85* 3.94*  CALCIUM 8.9 8.3* 8.6*  PHOS 3.3 4.1 5.2*   Liver Function Tests:  Recent Labs Lab 09/07/14 0445  09/09/14 0400 09/10/14 0525 09/11/14 0452  AST 31  --   --   --   --   ALT 12*  --   --   --   --   ALKPHOS 79  --   --   --   --   BILITOT 0.8  --   --   --   --   PROT 6.7  --   --   --   --   ALBUMIN 2.8*  < > 2.7* 2.4* 2.4*  < > = values in this interval not displayed. No results for input(s): LIPASE, AMYLASE in the last 168 hours. No results for input(s): AMMONIA in the last 168 hours. CBC:  Recent Labs Lab 09/07/14 0445 09/08/14 0430 09/09/14 0400 09/10/14 0525 09/11/14 0452  WBC 7.1 7.2 11.8* 11.4* 9.2  HGB 9.3* 9.7* 9.3* 8.2* 8.1*  HCT 30.6* 31.1* 28.8* 26.3* 26.1*  MCV 99.0 100.3* 97.6 97.0 97.4  PLT 203 191 216 187 202   Cardiac Enzymes: No  results for input(s): CKTOTAL, CKMB, CKMBINDEX, TROPONINI in the last 168 hours. CBG:  Recent Labs Lab 09/09/14 2348 09/10/14 0807 09/10/14 1223 09/10/14 1653 09/10/14 2135  GLUCAP 93 99 120* 118* 116*    Iron Studies:   Recent Labs  09/10/14 0525  IRON 14*  TIBC 232*  FERRITIN 170   Studies/Results: No results found. Medications: Infusions: . sodium chloride 10 mL/hr at 08/29/14 0543  . DOBUTamine 2.5 mcg/kg/min (09/09/14 1900)  . heparin 1,900 Units/hr (09/11/14 0734)  . sodium chloride irrigation      Scheduled Medications: . sodium chloride   Intravenous Once  . sodium chloride  10 mL/hr Intravenous Once  . amiodarone  200 mg Oral BID  . atorvastatin  40 mg Oral q1800  . cefTRIAXone (ROCEPHIN)  IV  1 g Intravenous Q24H  . chlorhexidine  15 mL Mouth/Throat BID  . darbepoetin (ARANESP) injection - DIALYSIS  100 mcg Intravenous Q Mon-HD  . DULoxetine  30 mg Oral Daily  . feeding supplement (NEPRO CARB STEADY)  237 mL Oral BID BM  . ferric gluconate (FERRLECIT/NULECIT) IV  125 mg Intravenous Daily  . furosemide  80 mg  Intravenous Q6H  . insulin aspart  0-9 Units Subcutaneous TID AC & HS  . insulin glargine  5 Units Subcutaneous QHS  . levothyroxine  75 mcg Oral QAC breakfast  . sildenafil  80 mg Oral TID  . sodium chloride irrigation  200 mL Irrigation Q2H while awake    have reviewed scheduled and prn medications.  Physical Exam: General: obese-  Heart: RRR Lungs: decreased BS at bases Abdomen: distended Extremities: pitting edema to dependent areas Dialysis Access: right IJ vascath placed 7/23 - approaching need for more permanent access later this week if still needed   09/11/2014,8:15 AM  LOS: 17 days

## 2014-09-11 NOTE — Progress Notes (Signed)
ANTICOAGULATION CONSULT NOTE - Follow Up Consult  Pharmacy Consult for Heparin  Indication: atrial fibrillation  Allergies  Allergen Reactions  . Bydureon [Exenatide] Other (See Comments)    sweating  . Losartan Potassium Other (See Comments)    insomnia    Patient Measurements: Height: 5\' 10"  (177.8 cm) Weight: 220 lb 7.4 oz (100 kg) IBW/kg (Calculated) : 73  Vital Signs: Temp: 98 F (36.7 C) (08/03 0000) Temp Source: Oral (08/03 0000) BP: 107/59 mmHg (08/03 0000) Pulse Rate: 65 (08/03 0000)  Labs:  Recent Labs  09/08/14 0430 09/08/14 1525 09/09/14 0214 09/09/14 0400 09/09/14 1151  09/10/14 0525 09/10/14 1615 09/11/14 0015  HGB 9.7*  --   --  9.3*  --   --  8.2*  --   --   HCT 31.1*  --   --  28.8*  --   --  26.3*  --   --   PLT 191  --   --  216  --   --  187  --   --   APTT >200* 40* 50*  --   --   --   --   --   --   LABPROT  --   --   --   --  15.6*  --  16.1*  --   --   INR  --   --   --   --  1.22  --  1.27  --   --   HEPARINUNFRC 0.95*  --   --  <0.10*  --   < > 0.36 <0.10* 0.75*  CREATININE 1.71*  --   --  3.18*  --   --  2.85*  --   --   < > = values in this interval not displayed.  Estimated Creatinine Clearance: 31.9 mL/min (by C-G formula based on Cr of 2.85).   Assessment: Slightly elevated heparin level due to heparin bolus being given ~ 2.5 hours late.   Goal of Therapy:  Heparin level 0.3-0.7 units/ml Monitor platelets by anticoagulation protocol: Yes   Plan:  -Continue heparin at 1900 units/hr given that slightly elevated heparin level was drawn only 4 hours after re-bolus -Check HL with AM labs -Daily CBC/HL -Monitor for bleeding  Narda Bonds 09/11/2014,1:17 AM

## 2014-09-12 ENCOUNTER — Inpatient Hospital Stay (HOSPITAL_COMMUNITY): Payer: Commercial Managed Care - HMO

## 2014-09-12 LAB — RENAL FUNCTION PANEL
Albumin: 2.5 g/dL — ABNORMAL LOW (ref 3.5–5.0)
Anion gap: 11 (ref 5–15)
BUN: 45 mg/dL — AB (ref 6–20)
CALCIUM: 8.6 mg/dL — AB (ref 8.9–10.3)
CO2: 24 mmol/L (ref 22–32)
Chloride: 90 mmol/L — ABNORMAL LOW (ref 101–111)
Creatinine, Ser: 4.87 mg/dL — ABNORMAL HIGH (ref 0.61–1.24)
GFR, EST AFRICAN AMERICAN: 13 mL/min — AB (ref >60)
GFR, EST NON AFRICAN AMERICAN: 12 mL/min — AB (ref >60)
Glucose, Bld: 129 mg/dL — ABNORMAL HIGH (ref 65–99)
POTASSIUM: 4.8 mmol/L (ref 3.5–5.1)
Phosphorus: 6 mg/dL — ABNORMAL HIGH (ref 2.5–4.6)
SODIUM: 125 mmol/L — AB (ref 135–145)

## 2014-09-12 LAB — GLUCOSE, CAPILLARY
Glucose-Capillary: 124 mg/dL — ABNORMAL HIGH (ref 65–99)
Glucose-Capillary: 141 mg/dL — ABNORMAL HIGH (ref 65–99)
Glucose-Capillary: 90 mg/dL (ref 65–99)

## 2014-09-12 LAB — CBC
HCT: 27 % — ABNORMAL LOW (ref 39.0–52.0)
HEMOGLOBIN: 8.4 g/dL — AB (ref 13.0–17.0)
MCH: 30.1 pg (ref 26.0–34.0)
MCHC: 31.1 g/dL (ref 30.0–36.0)
MCV: 96.8 fL (ref 78.0–100.0)
Platelets: 229 10*3/uL (ref 150–400)
RBC: 2.79 MIL/uL — AB (ref 4.22–5.81)
RDW: 16.2 % — AB (ref 11.5–15.5)
WBC: 8.4 10*3/uL (ref 4.0–10.5)

## 2014-09-12 LAB — URINE CULTURE

## 2014-09-12 LAB — HEPARIN LEVEL (UNFRACTIONATED)
Heparin Unfractionated: 1.88 IU/mL — ABNORMAL HIGH (ref 0.30–0.70)
Heparin Unfractionated: 0.54 IU/mL (ref 0.30–0.70)

## 2014-09-12 LAB — CARBOXYHEMOGLOBIN
Carboxyhemoglobin: 1.6 % — ABNORMAL HIGH (ref 0.5–1.5)
Methemoglobin: 1 % (ref 0.0–1.5)
O2 SAT: 95.8 %
TOTAL HEMOGLOBIN: 8.8 g/dL — AB (ref 13.5–18.0)

## 2014-09-12 LAB — MAGNESIUM: Magnesium: 2.2 mg/dL (ref 1.7–2.4)

## 2014-09-12 MED ORDER — LIDOCAINE HCL (PF) 1 % IJ SOLN: 5.0000 mL | INTRAMUSCULAR | Status: DC | PRN

## 2014-09-12 MED ORDER — NEPRO/CARBSTEADY PO LIQD: 237.0000 mL | ORAL | Status: DC | PRN

## 2014-09-12 MED ORDER — HEPARIN SODIUM (PORCINE) 1000 UNIT/ML DIALYSIS
1000.0000 [IU] | INTRAMUSCULAR | Status: DC | PRN
  Filled 2014-09-12: qty 1

## 2014-09-12 MED ORDER — HEPARIN SODIUM (PORCINE) 1000 UNIT/ML DIALYSIS: 20.0000 [IU]/kg | INTRAMUSCULAR | Status: DC | PRN

## 2014-09-12 MED ORDER — SODIUM CHLORIDE 0.9 % IV SOLN: 100.0000 mL | INTRAVENOUS | Status: DC | PRN

## 2014-09-12 MED ORDER — ALTEPLASE 2 MG IJ SOLR
2.0000 mg | Freq: Once | INTRAMUSCULAR | Status: DC | PRN
  Filled 2014-09-12: qty 2

## 2014-09-12 MED ORDER — PENTAFLUOROPROP-TETRAFLUOROETH EX AERO: 1.0000 "application " | INHALATION_SPRAY | CUTANEOUS | Status: DC | PRN

## 2014-09-12 MED ORDER — POLYETHYLENE GLYCOL 3350 17 G PO PACK
17.0000 g | PACK | Freq: Two times a day (BID) | ORAL | Status: DC
  Administered 2014-09-12 – 2014-09-13 (×2): 17 g via ORAL
  Filled 2014-09-12 (×5): qty 1

## 2014-09-12 MED ORDER — LIDOCAINE-PRILOCAINE 2.5-2.5 % EX CREA
1.0000 "application " | TOPICAL_CREAM | CUTANEOUS | Status: DC | PRN
  Filled 2014-09-12: qty 5

## 2014-09-12 NOTE — Progress Notes (Signed)
Physical Therapy Treatment Patient Details Name: Larry [name] MRN: [id number] DOB: 11-Jul-1950 Today's Date: 09/12/2014    History of Present Illness Larry [name] is a 64 y.o. male who complains of left hip pain after sustaining a mechanical fall at home. Pt with left intertrochanteric fx s/p IM nail. PMHx NICM, CAD, DDD, back pain, CVA, obesity. 7/23 CRRT initiated, 7/31 CRRT discontinued.    PT Comments    Patient agreeable and eager to participate in PT today. Despite increasing medical complexity, patient is progressing well. He performed bed mobility, transfers and ambulation as described below. Patient will continue to benefit from PT to normalize gait, increase endurance and decrease pain.  Follow Up Recommendations  CIR (wife requesting CIR)     Equipment Recommendations  Rolling walker with 5" wheels    Recommendations for Other Services       Precautions / Restrictions Precautions Precautions: Fall Restrictions Weight Bearing Restrictions: Yes LLE Weight Bearing: Weight bearing as tolerated    Mobility  Bed Mobility Overal bed mobility: Modified Independent Bed Mobility: Supine to Sit     Supine to sit: Modified independent (Device/Increase time);HOB elevated;+2 for safety/equipment (Extensive management of lines and leads.)     General bed mobility comments: Patient was able to talk through steps of bed mobility and was able to get himself to sitting using the bed rail to pull himself up with HOB raised to 60 degrees. Needs extensive lines and leads management.   Transfers Overall transfer level: Needs assistance Equipment used: Rolling walker (2 wheeled) Transfers: Sit to/from Stand Sit to Stand: +2 safety/equipment;Min assist (Needed standing balance assistance during 2nd sit to stand.)         General transfer comment: Patient was able to verbalize reminder to push from the bed and the arm rests of the recliner without cueing. Patient minimally  unsteady on feet after rest break and second sit to stand. Patient requires cueing for safe sequencing of stand to sit transfers.  Ambulation/Gait Ambulation/Gait assistance: Min guard;+2 safety/equipment Ambulation Distance (Feet): 25 Feet Assistive device: Rolling walker (2 wheeled) Gait Pattern/deviations: Step-to pattern;Decreased step length - right;Decreased stance time - left;Decreased weight shift to left;Shuffle;Narrow base of support   Gait velocity interpretation: <1.8 ft/sec, indicative of risk for recurrent falls General Gait Details: Was able to walk to the hallway, took a seated rest break, and took a few more steps after that. Patient was fatigued. Patient required moderate verbal cues to look up, and large gait abnormalities remain.   Stairs            Wheelchair Mobility    Modified Rankin (Stroke Patients Only)       Balance Overall balance assessment: Needs assistance Sitting-balance support: Bilateral upper extremity supported;Feet supported Sitting balance-Leahy Scale: Fair Sitting balance - Comments: Patient is increasingly stable in sitting.   Standing balance support: Bilateral upper extremity supported Standing balance-Leahy Scale: Poor Standing balance comment: Patient was able to take hands off walker to assist with line and lead management initially. At end of session, patient required min assist for standing balance after last sit to stand.                    Cognition Arousal/Alertness: Awake/alert Behavior During Therapy: WFL for tasks assessed/performed Overall Cognitive Status: Impaired/Different from baseline (Seemed a little different today, but still oriented.)                      Exercises Other  Exercises Other Exercises: Cervical ROM exercises taught to decrease neck pain.    General Comments        Pertinent Vitals/Pain Pain Assessment: 0-10 Faces Pain Scale: Hurts even more Pain Location: Neck/upper  traps Pain Descriptors / Indicators: Aching Pain Intervention(s): Monitored during session;Heat applied;Utilized relaxation techniques;Other (comment) (cervical ROM exercises taught and performed)    Home Living                      Prior Function            PT Goals (current goals can now be found in the care plan section) Acute Rehab PT Goals Patient Stated Goal: return home able to care for himself PT Goal Formulation: With patient Time For Goal Achievement: 09/24/14 Potential to Achieve Goals: Good Progress towards PT goals: Progressing toward goals    Frequency  Min 5X/week    PT Plan Discharge plan needs to be updated    Co-evaluation             End of Session Equipment Utilized During Treatment: Gait belt (Patient removed from O2 for ambulation, sats >90 throughout.) Activity Tolerance: Patient limited by fatigue Patient left: in chair;with call bell/phone within reach;with family/visitor present     Time: 7824-2353 PT Time Calculation (min) (ACUTE ONLY): 46 min  Charges:  $Gait Training: 23-37 mins $Therapeutic Exercise: 8-22 mins                    G CodesRoanna Epley, SPT [phone]  09/12/2014, 1:36 PM

## 2014-09-12 NOTE — Progress Notes (Signed)
Subjective:  Minimal UOP and creatinine up- sodium down Objective Vital signs in last 24 hours: Filed Vitals:   09/12/14 0431 09/12/14 0500 09/12/14 0600 09/12/14 0700  BP: 109/50 98/51 98/50  101/53  Pulse: 64 63 63 62  Temp: 97.5 F (36.4 C)     TempSrc: Oral     Resp: 12 13 11 11   Height:      Weight:  103.2 kg (227 lb 8.2 oz)    SpO2: 100% 96% 100% 100%   Weight change: 1.1 kg (2 lb 6.8 oz)  Intake/Output Summary (Last 24 hours) at 09/12/14 0736 Last data filed at 09/12/14 0700  Gross per 24 hour  Intake 1209.4 ml  Output    200 ml  Net 1009.4 ml    Assessment/ Plan: Pt is [name] 64 y.o. yo male with baseline nonischemic cardiomyopathy on milrinone with stage 3 CKD who was admitted on 08/25/2014 with s/p hip fx- had [name] on CRF - now HD requiring  Assessment/Plan: 1. Renal- CKD at baseline- with events became dialysis requiring- on CRRT from 7/23- 7/31. Had IHD 8/1. Unfortunately- his kidneys are not improving off of HD- will need another IHD treatment today. Vascath in since 7/23 so will ask IR to convert it to [name].  As far as long term plans- doing HD on someone on an inotrope successfully  has not been done  as far as I know.  We need to hope that he will either recover enough renal function to stay off of HD (crt in June was 2.4)  Or need to wean the dobutamine in order to do hemodialysis as an OP- PD could also maybe be an option but have had logistic issues related to placing pt on PD as inpatient in past.  Wife is appropriately upset to hear this news 2. Anemia- hgb falling with events and heparin- repleting iron and have added aranesp 3. HTN/volume-   Given sodium probably overloaded-     Baseline cardiomyopathy is complicating situation but surprisingly blood pressure is reasonable.  Lasix is not being effective at this time will stop  4. S/p hip fx- operative repair on 7/19- planning on SNF eventually 5. Urine looks cloudy- - looked like UTI- rocephin started    Larry [name]    Labs: Basic Metabolic Panel:  Recent Labs Lab 09/10/14 0525 09/11/14 0452 09/12/14 0630  NA 134* 131* 125*  K 4.4 4.3 4.8  CL 97* 96* 90*  CO2 28 27 24   GLUCOSE 99 126* 129*  BUN 18 31* 45*  CREATININE 2.85* 3.94* 4.87*  CALCIUM 8.3* 8.6* 8.6*  PHOS 4.1 5.2* 6.0*   Liver Function Tests:  Recent Labs Lab 09/07/14 0445  09/10/14 0525 09/11/14 0452 09/12/14 0630  AST 31  --   --   --   --   ALT 12*  --   --   --   --   ALKPHOS 79  --   --   --   --   BILITOT 0.8  --   --   --   --   PROT 6.7  --   --   --   --   ALBUMIN 2.8*  < > 2.4* 2.4* 2.5*  < > = values in this interval not displayed. No results for input(s): LIPASE, AMYLASE in the last 168 hours. No results for input(s): AMMONIA in the last 168 hours. CBC:  Recent Labs Lab 09/08/14 0430 09/09/14 0400 09/10/14 0525 09/11/14 0452 09/12/14 0630  WBC  7.2 11.8* 11.4* 9.2 8.4  HGB 9.7* 9.3* 8.2* 8.1* 8.4*  HCT 31.1* 28.8* 26.3* 26.1* 27.0*  MCV 100.3* 97.6 97.0 97.4 96.8  PLT 191 216 187 202 229   Cardiac Enzymes: No results for input(s): CKTOTAL, CKMB, CKMBINDEX, TROPONINI in the last 168 hours. CBG:  Recent Labs Lab 09/11/14 0813 09/11/14 1231 09/11/14 1645 09/11/14 1901 09/11/14 2134  GLUCAP 134* 132* 119* 126* 136*    Iron Studies:   Recent Labs  09/10/14 0525  IRON 14*  TIBC 232*  FERRITIN 170   Studies/Results: No results found. Medications: Infusions: . sodium chloride 10 mL/hr at 08/29/14 0543  . DOBUTamine 2.5 mcg/kg/min (09/12/14 0323)  . heparin 1,800 Units/hr (09/11/14 2159)  . sodium chloride irrigation      Scheduled Medications: . sodium chloride   Intravenous Once  . sodium chloride  10 mL/hr Intravenous Once  . amiodarone  200 mg Oral BID  . atorvastatin  40 mg Oral q1800  . cefTRIAXone (ROCEPHIN)  IV  1 g Intravenous Q24H  . chlorhexidine  15 mL Mouth/Throat BID  . darbepoetin (ARANESP) injection - DIALYSIS  100 mcg Intravenous Q  Mon-HD  . DULoxetine  30 mg Oral Daily  . feeding supplement (NEPRO CARB STEADY)  237 mL Oral BID BM  . ferric gluconate (FERRLECIT/NULECIT) IV  125 mg Intravenous Daily  . furosemide  160 mg Intravenous Q6H  . insulin aspart  0-9 Units Subcutaneous TID AC & HS  . insulin glargine  5 Units Subcutaneous QHS  . levothyroxine  75 mcg Oral QAC breakfast  . polyethylene glycol  17 g Oral Daily  . sildenafil  80 mg Oral TID  . sodium chloride irrigation  200 mL Irrigation Q2H while awake    have reviewed scheduled and prn medications.  Physical Exam: General: obese-  Heart: RRR Lungs: decreased BS at bases Abdomen: distended Extremities: pitting edema to dependent areas Dialysis Access: right IJ vascath placed 7/23 - approaching need for more permanent access later this week if still needed   09/12/2014,7:36 AM  LOS: 18 days

## 2014-09-12 NOTE — Progress Notes (Signed)
ANTICOAGULATION CONSULT NOTE - Follow Up Consult  Pharmacy Consult for Heparin Indication: atrial fibrillation  Allergies  Allergen Reactions  . Bydureon [Exenatide] Other (See Comments)    sweating  . Losartan Potassium Other (See Comments)    insomnia    Patient Measurements: Height: 5\' 10"  (177.8 cm) Weight: 227 lb 8.2 oz (103.2 kg) IBW/kg (Calculated) : 73 Heparin Dosing Weight: 94kg  Vital Signs: Temp: 98.1 F (36.7 C) (08/04 1227) Temp Source: Oral (08/04 1227) BP: 100/42 mmHg (08/04 1227) Pulse Rate: 70 (08/04 1227)  Labs:  Recent Labs  09/10/14 0525  09/11/14 0452 09/11/14 0615 09/11/14 1350 09/12/14 0630 09/12/14 1132  HGB 8.2*  --  8.1*  --   --  8.4*  --   HCT 26.3*  --  26.1*  --   --  27.0*  --   PLT 187  --  202  --   --  229  --   LABPROT 16.1*  --   --  17.9*  --   --   --   INR 1.27  --   --  1.47  --   --   --   HEPARINUNFRC 0.36  < >  --  0.50 0.74* 1.88* 0.54  CREATININE 2.85*  --  3.94*  --   --  4.87*  --   < > = values in this interval not displayed.  Estimated Creatinine Clearance: 18.9 mL/min (by C-G formula based on Cr of 4.87).   Medications:  Heparin @ 1800 units/hr  Assessment: Larry [name] continues on heparin for afib. Heparin level is therapeutic. Noted plan for tunneled catheter placement tomorrow. Coumadin remains on hold. Hemoglobin is low but stable. No bleeding reported.  Goal of Therapy:  Heparin level 0.3-0.7 units/ml Monitor platelets by anticoagulation protocol: Yes   Plan:  1) Continue heparin at 1800 units/hr 2) Heparin level, CBC in AM  Deboraha Sprang 09/12/2014,12:49 PM

## 2014-09-12 NOTE — Progress Notes (Signed)
Chief Complaint: Patient was seen in consultation today for placement of tunneled dialysis catheter at the request of Dr. Corliss Parish  Referring Physician(s): Dr. Moshe Cipro  History of Present Illness: Larry [name] is a 64 y.o. 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 Was admitted after fall and fracture of (L)hip, slowly rehabing after IM nail on 7/18 He has been receiving HD via (R)IJ vascath that was originally placed about 12 days ago. It has become evident that pt will need extended HD and IR is asked to place tunneled HD catheter so that HD can be continued. Chart, PMHx, meds, labs reviewed. Pt with cardiomyopathy and Afib. Normally on Coumadin but has been off several days and currently on IV hep drip. Pt feels ok, though a bit exhausted after PT today. No fevers, chills. UA + on 8/2, has been on IV Rocephin.  Past Medical History  Diagnosis Date  . NICM (nonischemic cardiomyopathy)     a. NICM. EF 2011 25-30%. b. LHC 2009 nonobstructive CAD. c. h/o St[employer] Jude ICD 2012. d. Echo 04/2013 - EF 25-30% with mildly decreased RV systolic function. e. Echo 01/2014: EF 20-25%, inf akinesis, RHC. e. Home milrinone started 4/16.  [name] CAD (coronary artery disease)     a. nonobstructive CAD by cath 2009 & 06/2014.  . Obesity   . CVA (cerebral vascular accident)     CVA 2007 without residual deficit  . Pituitary tumor      (nonfunctionging pituitary microadenoma) s/p gamma knife surgery at [facility] 2009 with neuropathy and retinopathy  . Depression   . Erectile dysfunction   . DDD (degenerative disc disease)     Chronic low back pain.   . OSA (obstructive sleep apnea)     a. 4/16 sleep study with severe OSA  . Monoclonal gammopathy   . CKD (chronic kidney disease), stage III   . 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 Hypogonadotropic hypogonadism   . Polycythemia, secondary     improved/resolved  . Hypercholesterolemia   . Back pain     F/B [name]  .  Monoclonal gammopathy of unknown significance     per [name]  . Neck pain     F/B [name]  . PAF (paroxysmal atrial fibrillation) 06/04/10    a. On Xarelto. b. DCCV 02/2014, 04/2014 to NSR.  [name] History of diverticulitis of colon   . Type II or unspecified type diabetes mellitus with neurological manifestations, uncontrolled   . Nonproliferative diabetic retinopathy NOS(362.03)   . Ventricular tachycardia 10/25/13    appropriate ICD shock, VT CL 230-240 msec  . Confusion 02/01/2014  . Hypertension   . AICD (automatic cardioverter/defibrillator) present   . Anxiety   . Peripheral vascular disease     2007  . Hypothyroidism   . PTSD (post-traumatic stress disorder)     a. Anxiety/PTSD from ICD shock  . Lupus anticoagulant positive     a. No h/o DVT. Saw Dr Lindi Adie with hematology.   . Pulmonary hypertension     a. Mixed pulmonary venous hypertension and PAH  . Chronic respiratory failure     a. As of 08/2014 requiring home O2 with ambulation due to desat.    Past Surgical History  Procedure Laterality Date  . Gamma knife surgery for pituitary tumor    . Tonsillectomy    . Cardiac defibrillator placement  02/19/10    By JA.   . Right heart catheterization N/A 09/17/2013    Procedure:  RIGHT HEART CATH;  Surgeon: [name], MD;  Location: [facility] CATH LAB;  Service: Cardiovascular;  Laterality: N/A;  . Cardiac catheterization    . Brain surgery    . Insert / replace / remove pacemaker    . Cardioversion N/A 02/14/2014    Procedure: CARDIOVERSION;  Surgeon: [name], MD;  Location: [place];  Service: Cardiovascular;  Laterality: N/A;  . Cardioversion N/A 05/02/2014    Procedure: CARDIOVERSION;  Surgeon: [name], MD;  Location: [facility] ENDOSCOPY;  Service: Cardiovascular;  Laterality: N/A;  . Right heart catheterization N/A 05/08/2014    Procedure: RIGHT HEART CATH;  Surgeon: [name], MD;  Location: [facility] CATH LAB;  Service: Cardiovascular;  Laterality: N/A;    . Cardiac catheterization N/A 06/26/2014    Procedure: Right/Left Heart Cath And Coronary Angiography;  Surgeon: [name], MD;  Location: [place] CV LAB;  Service: Cardiovascular;  Laterality: N/A;  . Cardiac catheterization N/A 08/21/2014    Procedure: Right Heart Cath;  Surgeon: [name], MD;  Location: [place] CV LAB;  Service: Cardiovascular;  Laterality: N/A;  . Multiple extractions with alveoloplasty N/A 08/22/2014    Procedure: Extraction of toothy #'s 3,5,6,7,9,10,11,12,13,14,20,21,22,23,27,28,29,30 with alveoloplasty and bilateral mandibular tori reductions;  Surgeon: Lenn Cal, DDS;  Location: [place];  Service: Oral Surgery;  Laterality: N/A;  . Cataract extraction    . Femur im nail Left 08/26/2014    Procedure: INTRAMEDULLARY (IM) NAIL FEMORAL;  Surgeon: [name], MD;  Location: [place];  Service: Orthopedics;  Laterality: Left;    Allergies: Bydureon and Losartan potassium  Medications:  Current facility-administered medications:  .  0.9 %  sodium chloride infusion, , Intravenous, Continuous, Kate Sable, MD, Last Rate: 10 mL/hr at 08/29/14 0543 .  0.9 %  sodium chloride infusion, , Intravenous, Once, Rica Koyanagi, MD .  0.9 %  sodium chloride infusion, 10 mL/hr, Intravenous, Once, Roger A Foley, CRNA .  Place/Maintain arterial line, , , Until Discontinued **AND** 0.9 %  sodium chloride infusion, , Intra-arterial, PRN, Gardiner Barefoot, NP, Last Rate: 6 mL/hr at 09/09/14 1900 .  albuterol (PROVENTIL) (2.5 MG/3ML) 0.083% nebulizer solution 2.5 mg, 2.5 mg, Nebulization, Q4H PRN, Thurnell Lose, MD .  ALPRAZolam Duanne Moron) tablet 0.25 mg, 0.25 mg, Oral, Q12H PRN, Barton Dubois, MD, 0.25 mg at 09/09/14 1836 .  amiodarone (PACERONE) tablet 200 mg, 200 mg, Oral, BID, [name], MD, 200 mg at 09/12/14 0948 .  atorvastatin (LIPITOR) tablet 40 mg, 40 mg, Oral, q1800, Amy D Clegg, NP, 40 mg at 09/11/14 1757 .  cefTRIAXone (ROCEPHIN) 1 g in  dextrose 5 % 50 mL IVPB, 1 g, Intravenous, Q24H, Amy D Clegg, NP, 1 g at 09/11/14 1607 .  chlorhexidine (PERIDEX) 0.12 % solution 15 mL, 15 mL, Mouth/Throat, BID, Lenn Cal, DDS, 15 mL at 09/11/14 2143 .  Darbepoetin Alfa (ARANESP) injection 100 mcg, 100 mcg, Intravenous, Q Mon-HD, Corliss Parish, MD, 100 mcg at 09/09/14 1817 .  DOBUTamine (DOBUTREX) infusion 4000 mcg/mL, 2.5 mcg/kg/min, Intravenous, Continuous, [name], MD, Last Rate: 3.8 mL/hr at 09/12/14 0323, 2.5 mcg/kg/min at 09/12/14 0323 .  DULoxetine (CYMBALTA) DR capsule 30 mg, 30 mg, Oral, Daily, [name], MD, 30 mg at 09/12/14 0948 .  feeding supplement (NEPRO CARB STEADY) liquid 237 mL, 237 mL, Oral, BID BM, Barton Dubois, MD, 120 mL at 09/06/14 0901 .  ferric gluconate (NULECIT) 125 mg in sodium chloride 0.9 % 100  mL IVPB, 125 mg, Intravenous, Daily, Corliss Parish, MD, 125 mg at 09/11/14 1232 .  heparin ADULT infusion 100 units/mL (25000 units/250 mL), 1,800 Units/hr, Intravenous, Continuous, Otilio Miu, [facility], Last Rate: 18 mL/hr at 09/11/14 2159, 1,800 Units/hr at 09/11/14 2159 .  insulin aspart (novoLOG) injection 0-9 Units, 0-9 Units, Subcutaneous, TID AC & HS, Kelvin Cellar, MD, 1 Units at 09/12/14 0844 .  insulin glargine (LANTUS) injection 5 Units, 5 Units, Subcutaneous, QHS, Barton Dubois, MD, 5 Units at 09/05/14 2201 .  levothyroxine (SYNTHROID, LEVOTHROID) tablet 75 mcg, 75 mcg, Oral, QAC breakfast, Thurnell Lose, MD, 75 mcg at 09/12/14 0615 .  morphine 2 MG/ML injection 0.5 mg, 0.5 mg, Intravenous, Q2H PRN, Thurnell Lose, MD, 0.5 mg at 09/06/14 1121 .  ondansetron (ZOFRAN) injection 4 mg, 4 mg, Intravenous, Q6H PRN, Thurnell Lose, MD, 4 mg at 09/12/14 0321 .  oxyCODONE-acetaminophen (PERCOCET/ROXICET) 5-325 MG per tablet 1 tablet, 1 tablet, Oral, Q4H PRN, 1 tablet at 09/12/14 0954 **AND** oxyCODONE (Oxy IR/ROXICODONE) immediate release tablet 5 mg, 5 mg, Oral, Q4H PRN, Amy D  Clegg, NP, 5 mg at 09/12/14 0954 .  polyethylene glycol (MIRALAX / GLYCOLAX) packet 17 g, 17 g, Oral, BID, Belkys A Regalado, MD .  sildenafil (REVATIO) tablet 80 mg, 80 mg, Oral, TID, Thurnell Lose, MD, 80 mg at 09/12/14 0948 .  sodium chloride 0.9 % injection 10-40 mL, 10-40 mL, Intracatheter, PRN, Thurnell Lose, MD, 10 mL at 08/29/14 0505 .  sodium chloride 0.9 % primer fluid for CRRT, , CRRT, PRN, Mauricia Area, MD .  sodium chloride irrigation 0.9 % 200 mL, 200 mL, Irrigation, Continuous, Lenn Cal, DDS, 200 mL at 09/11/14 1000 .  sodium chloride irrigation 0.9 % 200 mL, 200 mL, Irrigation, Q2H while awake, Kelvin Cellar, MD, 200 mL at 09/12/14 0949 .  temazepam (RESTORIL) capsule 7.5 mg, 7.5 mg, Oral, QHS PRN, Barton Dubois, MD, 7.5 mg at 09/11/14 2139  Facility-Administered Medications Ordered in Other Encounters:  .  alteplase (ACTIVASE) injection 2 mg, 2 mg, Intracatheter, Once, [name], MD    Family History  Problem Relation Age of Onset  . Lung cancer    . Cancer Mother     skin  . Dementia Mother   . Depression Mother   . Heart disease Father 62  . Hypertension Father   . Lung cancer Father   . Obesity Father   . Obesity Sister   . Hypertension Sister   . Diabetes Brother   . Hypertension Brother   . Heart disease Brother 28    Died of stroke MI age 80  . Obesity Brother   . Lung cancer Paternal Uncle   . Diabetes Paternal Uncle     requiring leg amputations     History   Social History  . Marital Status: Married    Spouse Name: N/A  . Number of Children: 1  . Years of Education: N/A   Occupational History  . Disabled    Social History Main Topics  . Smoking status: Former Smoker    Types: Cigars    Quit date: 02/09/1983  . Smokeless tobacco: Never Used     Comment: 25 yrs ago  . Alcohol Use: No  . Drug Use: No  . Sexual Activity: No   Other Topics Concern  . None   Social History Narrative   Lives with wife.         Review of Systems: A 12  point ROS discussed and pertinent positives are indicated in the HPI above.  All other systems are negative.  Review of Systems  Vital Signs: BP 114/54 mmHg  Pulse 66  Temp(Src) 97.5 F (36.4 C) (Oral)  Resp 12  Ht 5\' 10"  (1.778 m)  Wt 227 lb 8.2 oz (103.2 kg)  BMI 32.64 kg/m2  SpO2 99%  Physical Exam  Constitutional: He is oriented to person, place, and time. He appears well-developed and well-nourished. No distress.  HENT:  Head: Normocephalic.  Mouth/Throat: Oropharynx is clear and moist.  Neck: No JVD present. No tracheal deviation present.  (R)tempcath intact, no hematoma.  Cardiovascular: Normal rate, regular rhythm and normal heart sounds.   Pulmonary/Chest: Effort normal and breath sounds normal. No respiratory distress.  Musculoskeletal: He exhibits no edema.  (R)UE PICC intact  Neurological: He is alert and oriented to person, place, and time.  Psychiatric: He has a normal mood and affect. Judgment normal.    Labs:  CBC:  Recent Labs  09/09/14 0400 09/10/14 0525 09/11/14 0452 09/12/14 0630  WBC 11.8* 11.4* 9.2 8.4  HGB 9.3* 8.2* 8.1* 8.4*  HCT 28.8* 26.3* 26.1* 27.0*  PLT 216 187 202 229    COAGS:  Recent Labs  08/25/14 1534  09/07/14 0445 09/08/14 0430 09/08/14 1525 09/09/14 0214 09/09/14 1151 09/10/14 0525 09/11/14 0615  INR 1.61*  --   --   --   --   --  1.22 1.27 1.47  APTT  --   < > 151* >200* 40* 50*  --   --   --   < > = values in this interval not displayed.  BMP:  Recent Labs  09/09/14 0400 09/10/14 0525 09/11/14 0452 09/12/14 0630  NA 128* 134* 131* 125*  K 4.7 4.4 4.3 4.8  CL 98* 97* 96* 90*  CO2 24 28 27 24   GLUCOSE 135* 99 126* 129*  BUN 20 18 31* 45*  CALCIUM 8.9 8.3* 8.6* 8.6*  CREATININE 3.18* 2.85* 3.94* 4.87*  GFRNONAA 19* 22* 15* 12*  GFRAA 23* 26* 17* 13*    LIVER FUNCTION TESTS:  Recent Labs  06/19/14 1030 08/07/14 0957  09/04/14 0413  09/07/14 0445  09/09/14 0400  09/10/14 0525 09/11/14 0452 09/12/14 0630  BILITOT 1.2 1.0  --  1.1  --  0.8  --   --   --   --   --   AST 23 27  --  26  --  31  --   --   --   --   --   ALT 31 30  --  10*  --  12*  --   --   --   --   --   ALKPHOS 82 78  --  68  --  79  --   --   --   --   --   PROT 8.3* 8.6*  --  6.7  --  6.7  --   --   --   --   --   ALBUMIN 3.6 4.1  < > 2.7*  < > 2.8*  < > 2.7* 2.4* 2.4* 2.5*  < > = values in this interval not displayed.   Assessment and Plan: CKD in need of extended HD UTI +Serratia Cx, on IV Rocephin. Afebrile and WBC normal IV hep drip for A.Fib Discussed with pt and family plan for tunneled HD catheter. Will make NPO p MN and will need to hold heparin prior to Permcath  placement. Labs reviewed, ok Procedure, risks, complications, use of sedation discussed to pt and wife. Consent signed in chart   Signed: Ascencion Dike 09/12/2014, 12:09 PM   I spent a total of 20 Minutes in face to face in clinical consultation, greater than 50% of which was counseling/coordinating care for placement of tunneled dialysis catheter.

## 2014-09-12 NOTE — Progress Notes (Signed)
Rehab Admissions Coordinator Note:  Patient was screened by Cleatrice Burke for appropriateness for an Inpatient Acute Rehab Consult per PT recommendation and wife request. At this time, we are recommending Inpatient Rehab consult. [employer] will have to approve any rehab venue. Likely new ESRD also. I will contact Dr. Tyrell Antonio for order.  Cleatrice Burke 09/12/2014, 12:21 PM  I can be reached at [phone].

## 2014-09-12 NOTE — Progress Notes (Signed)
[facility] PROGRESS NOTE  Larry [name] MPN:[id number] DOB: 11/17/1950 DOA: 08/25/2014 PCP: [name], MD   Interim summary 64 y/o man with history of chronic systolic CHF (nonischemic cardiomyopathy) s/p St. 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 He is on chronic milrinone 0.5 mcg. Admitted with fall---> broken L hip . 7/18 had IM Nail L femur. Course complicated with fluid overload and acute on chronic renal failure (due to ATN most liekely). Required CVVHD 7/23>> 7/31. Now on intermittent HD and being assessed for ESRD and HD dependency .  Assessment/Plan: 1. Severe nonischemic cardiomyopathy with chronic systolic heart failure with an EF of 25%. History of V. tach. Patient has AICD, on home milrinone drip. Experienced hypotension requiring norepinephrine and now on dobutamine; plan is to continue dobutamine instead of milrinone. -Patient was started on CVVHD on 08/31/2014 until 09/08/2014 -Cardiology managing HF meds -He tolerated CVVHD; but has remained oliguric and was initiated on IHD (8/1) -2L removed during HD on 8/1 -weight 100 Kg ---102.  -HD today.   2.  Acute on chronic renal failure -Xarelto was discontinued and started on IV heparin -Nephrology was consulted, feeling renal failure likely represents postoperative ATN/hypoperfusion from hypotension -CVVH was started on 08/31/2014 and stopped on 7/31 -still oliguric, LE edema. Plan for HD 8-4  -Nephrology managing  -plan is to observed and continue HD intermittently as needed last treatment on 8/1  -most likely will ended on permanent HD -if renal function recovers, patient might need Flomax prior to discontinuation of foley catheter.   3. Constipation -Patient w/o diarrhea; reports BM are ok now. -last BM 8/1 -KUB negative for obstruction. Change miralax to BID>   4.  Hyponatremia -I suspect secondary to volume overload and  renal failure -HD today.  -follow trend.   5. Mechanical trip and fall with left femoral intertrochanteric fracture.  -status post intramedullary nail to left hip on 7/18 -WBAT -PT/OT evaluation  6. Paroxysmal atrial fibrillation and pulmonary HTN.  -Xarelto discontinued due to worsening kidney function, now on IV heparin and with plans for coumadin eventually -On Amio -continue revatio for pulm HTN -hold coumadin in case he required permanent access for dialysis. Discussed with renal.   7. Obstructive sleep apnea.  -Patient refusing BiPAP;   8. DM type II. recent A1c of 7.7 -lantus changed to 5 units along with SSI since 7/28, he had hypoglycemia on 7/27 -follow CBG's and adjust as needed -no further hypoglycemic events; eating ok  9. Insomnia: -will continue using restoril PRN   10-UTI; UA with too numerous to count WBC. Started on ceftriaxone 8-03. urine culture growing serratia/ sensitive to ceftriaxone./    Code Status: Full Family Communication: wife at bedside Disposition Plan: Patient in the process of been assessed for ESRD. On Dobutamine drip for heart failure.    Consultants:  Ortho (Dr. Percell Miller)  HF team   Nephrology  Procedures:  S/P intramedullary nail on left hip on 7/18  CVVH   Antibiotics:  ceftriaxone  HPI/Subjective: He denies worsening dyspnea. No BM yet. Passing gas. No abdominal pain.  No significant urine out put.    Objective: Filed Vitals:   09/12/14 0812  BP: 114/54  Pulse: 65  Temp: 97.5 F (36.4 C)  Resp: 10    Intake/Output Summary (Last 24 hours) at 09/12/14 0838 Last data filed at 09/12/14 0700  Gross per 24 hour  Intake 1209.4 ml  Output  200 ml  Net 1009.4 ml   Filed Weights   09/10/14 0500 09/11/14 0446 09/12/14 0500  Weight: 100 kg (220 lb 7.4 oz) 102.1 kg (225 lb 1.4 oz) 103.2 kg (227 lb 8.2 oz)    Exam:   General:  He is  alert, oriented X 3, follows commands, chronically ill-appearing;    Cardiovascular:no rubs, no gallops, RRR  Respiratory: Normal respiratory effort, lungs are clear to auscultation bilaterally  Abdomen: soft, NT, ND, positive Bowel sounds  Musculoskeletal: Surgical incision site appears clean, no evidence of infection. Plus edema   Data Reviewed: Basic Metabolic Panel:  Recent Labs Lab 09/08/14 0430 09/09/14 0400 09/10/14 0525 09/11/14 0452 09/12/14 0630  NA 133* 128* 134* 131* 125*  K 4.4 4.7 4.4 4.3 4.8  CL 99* 98* 97* 96* 90*  CO2 27 24 28 27 24   GLUCOSE 126* 135* 99 126* 129*  BUN 7 20 18  31* 45*  CREATININE 1.71* 3.18* 2.85* 3.94* 4.87*  CALCIUM 8.5* 8.9 8.3* 8.6* 8.6*  MG 2.3 2.3 2.2 2.3 2.2  PHOS 2.2* 3.3 4.1 5.2* 6.0*   CBC:  Recent Labs Lab 09/08/14 0430 09/09/14 0400 09/10/14 0525 09/11/14 0452 09/12/14 0630  WBC 7.2 11.8* 11.4* 9.2 8.4  HGB 9.7* 9.3* 8.2* 8.1* 8.4*  HCT 31.1* 28.8* 26.3* 26.1* 27.0*  MCV 100.3* 97.6 97.0 97.4 96.8  PLT 191 216 187 202 229   BNP (last 3 results)  Recent Labs  05/22/14 1029 05/27/14 1126 06/19/14 1030  BNP 935.2* 1627.8* 738.9*    ProBNP (last 3 results)  Recent Labs  10/09/13 0926 11/14/13 0926 01/14/14 1112  PROBNP 1561.0* 1261.0* 5769.0*   CBG:  Recent Labs Lab 09/11/14 0813 09/11/14 1231 09/11/14 1645 09/11/14 1901 09/11/14 2134  GLUCAP 134* 132* 119* 126* 136*    Recent Results (from the past 240 hour(s))  Clostridium Difficile by PCR (not at [facility])     Status: None   Collection Time: 09/02/14 10:27 PM  Result Value Ref Range Status   Toxigenic C Difficile by pcr NEGATIVE NEGATIVE Final  Urine culture     Status: None (Preliminary result)   Collection Time: 09/10/14  9:30 AM  Result Value Ref Range Status   Specimen Description URINE, RANDOM  Final   Special Requests NONE  Final   Culture >=100,000 COLONIES/mL GRAM NEGATIVE RODS  Final   Report Status PENDING  Incomplete     Studies: No results found.  Scheduled Meds: . sodium chloride    Intravenous Once  . sodium chloride  10 mL/hr Intravenous Once  . amiodarone  200 mg Oral BID  . atorvastatin  40 mg Oral q1800  . cefTRIAXone (ROCEPHIN)  IV  1 g Intravenous Q24H  . chlorhexidine  15 mL Mouth/Throat BID  . darbepoetin (ARANESP) injection - DIALYSIS  100 mcg Intravenous Q Mon-HD  . DULoxetine  30 mg Oral Daily  . feeding supplement (NEPRO CARB STEADY)  237 mL Oral BID BM  . ferric gluconate (FERRLECIT/NULECIT) IV  125 mg Intravenous Daily  . insulin aspart  0-9 Units Subcutaneous TID AC & HS  . insulin glargine  5 Units Subcutaneous QHS  . levothyroxine  75 mcg Oral QAC breakfast  . polyethylene glycol  17 g Oral Daily  . sildenafil  80 mg Oral TID  . sodium chloride irrigation  200 mL Irrigation Q2H while awake   Continuous Infusions: . sodium chloride 10 mL/hr at 08/29/14 0543  . DOBUTamine 2.5 mcg/kg/min (09/12/14 0323)  . heparin  1,800 Units/hr (09/11/14 2159)  . sodium chloride irrigation      Principal Problem:   Fracture, intertrochanteric, left femur Active Problems:   Essential hypertension, benign   Persistent atrial fibrillation   Automatic implantable cardioverter-defibrillator in situ   DM2 (diabetes mellitus, type 2)   OSA (obstructive sleep apnea), intolerant to CPAP/BIPAP   Ventricular tachycardia   Chronic systolic CHF (congestive heart failure)   Hip fracture   Left femoral shaft fracture   Acute on chronic renal failure   Hyponatremia   Acute on chronic systolic CHF (congestive heart failure)   Acute renal failure superimposed on stage 4 chronic kidney disease   SOB (shortness of breath)   Insomnia   Oliguria   Adjustment disorder with anxious mood   Chronic atrial fibrillation   Pressure ulcer   Time spent: 30 minutes   Anthonette Lesage, [facility] Pager [phone]. If 7PM-7AM, please contact night-coverage at [email or web address], password [facility] 09/12/2014, 8:38 AM  LOS: 18 days

## 2014-09-12 NOTE — Progress Notes (Signed)
Patient ID: Larry [name], male   DOB: 1951-01-21, 64 y.o.   MRN: [id number] Advanced Heart Failure Rounding Note   Subjective:    [name] is a 64 y/o man with history of chronic systolic CHF (nonischemic cardiomyopathy) s/p St. 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 He is on chronic milrinone 0.5 mcg.  Prior to admit he was being considered for LVAD.   Dischaged 7/16 after dental procedures. Also had RHC with elevate pulmonary pressures. Revatio was increased to 80 mg tid during that stay.  Admitted with fall---> broken L  hip . 7/18 had IM Nail L femur.  7/23 started CVVHD, CVVH stopped 7/31.   Hypotensive 7/28, stopped milrinone and started norepinephrine.  Pressure recovered, now off norepinephrine and on dobutamine 2.5 mcg/kg/min.  BP stable today.  He had HD on 8/1, tolerated it well in terms of BP.  Minimal UOP now on IV Lasix.  CVP high and weight rising.  Denies dyspnea at rest.  Walked short distance today with PT.    Objective:   Weight Range:  Vital Signs:   Temp:  [97.5 F (36.4 C)-98.3 F (36.8 C)] 98.1 F (36.7 C) (08/04 1227) Pulse Rate:  [59-78] 70 (08/04 1227) Resp:  [10-21] 12 (08/04 1227) BP: (93-123)/(42-62) 100/42 mmHg (08/04 1227) SpO2:  [93 %-100 %] 100 % (08/04 1227) Weight:  [227 lb 8.2 oz (103.2 kg)] 227 lb 8.2 oz (103.2 kg) (08/04 0500) Last BM Date: 09/09/14  Weight change: Filed Weights   09/10/14 0500 09/11/14 0446 09/12/14 0500  Weight: 220 lb 7.4 oz (100 kg) 225 lb 1.4 oz (102.1 kg) 227 lb 8.2 oz (103.2 kg)    Intake/Output:   Intake/Output Summary (Last 24 hours) at 09/12/14 1315 Last data filed at 09/12/14 1100  Gross per 24 hour  Intake  899.4 ml  Output    200 ml  Net  699.4 ml     Physical Exam: General:  NAD  HEENT: normal Neck: supple. JVP 14 cm. RIJ trialysis catheter.  Carotids 2+ bilat; no bruits. No lymphadenopathy or thryomegaly  appreciated. Cor: PMI nondisplaced. Regular rate & rhythm. No rubs, gallops or murmurs. Lungs: Diminished both bases with mild crackles Abdomen: soft, nontender, nondistended. No hepatosplenomegaly. No bruits or masses. Good bowel sounds. Extremities: no cyanosis, clubbing, rash. RUE PICC dual lumen . R Thigh lateral aspect dressing intact x2. 1+ edema to knees bilaterally. Neuro: sleepy but awakens  Telemetry: NSR   Labs: Basic Metabolic Panel:  Recent Labs Lab 09/08/14 0430 09/09/14 0400 09/10/14 0525 09/11/14 0452 09/12/14 0630  NA 133* 128* 134* 131* 125*  K 4.4 4.7 4.4 4.3 4.8  CL 99* 98* 97* 96* 90*  CO2 27 24 28 27 24   GLUCOSE 126* 135* 99 126* 129*  BUN 7 20 18  31* 45*  CREATININE 1.71* 3.18* 2.85* 3.94* 4.87*  CALCIUM 8.5* 8.9 8.3* 8.6* 8.6*  MG 2.3 2.3 2.2 2.3 2.2  PHOS 2.2* 3.3 4.1 5.2* 6.0*    Liver Function Tests:  Recent Labs Lab 09/07/14 0445  09/08/14 0430 09/09/14 0400 09/10/14 0525 09/11/14 0452 09/12/14 0630  AST 31  --   --   --   --   --   --   ALT 12*  --   --   --   --   --   --   ALKPHOS 79  --   --   --   --   --   --  BILITOT 0.8  --   --   --   --   --   --   PROT 6.7  --   --   --   --   --   --   ALBUMIN 2.8*  < > 2.7* 2.7* 2.4* 2.4* 2.5*  < > = values in this interval not displayed. No results for input(s): LIPASE, AMYLASE in the last 168 hours. No results for input(s): AMMONIA in the last 168 hours.  CBC:  Recent Labs Lab 09/08/14 0430 09/09/14 0400 09/10/14 0525 09/11/14 0452 09/12/14 0630  WBC 7.2 11.8* 11.4* 9.2 8.4  HGB 9.7* 9.3* 8.2* 8.1* 8.4*  HCT 31.1* 28.8* 26.3* 26.1* 27.0*  MCV 100.3* 97.6 97.0 97.4 96.8  PLT 191 216 187 202 229    Cardiac Enzymes: No results for input(s): CKTOTAL, CKMB, CKMBINDEX, TROPONINI in the last 168 hours.  BNP: BNP (last 3 results)  Recent Labs  05/22/14 1029 05/27/14 1126 06/19/14 1030  BNP 935.2* 1627.8* 738.9*    ProBNP (last 3 results)  Recent Labs   10/09/13 0926 11/14/13 0926 01/14/14 1112  PROBNP 1561.0* 1261.0* 5769.0*      Other results:  Imaging: Dg Abd 1 View  09/12/2014   CLINICAL DATA:  Constipation, abdominal tenderness, history of diverticulitis and diabetes.  EXAM: ABDOMEN - 1 VIEW  COMPARISON:  Abdominal film of August 30, 2014  FINDINGS: There is mild gaseous distention of the stomach and of the colon. There is a moderate stool burden in the rectosigmoid. There is no small bowel obstructive pattern. There are no free extraluminal gas collections. There are degenerative changes of the lumbar spine. There is a rounded calcific density in the pelvis that likely reflects an osteophyte at the L5-S1 level.  IMPRESSION: Moderate gaseous distention of the stomach and colon. The pattern does not appear obstructive however. When compared to the study of July 22nd the volume of colonic gas has decreased somewhat. There is no evidence of perforation.   Electronically Signed   By: Jancarlo  Martinique M.D.   On: 09/12/2014 09:18     Medications:     Scheduled Medications: . sodium chloride   Intravenous Once  . sodium chloride  10 mL/hr Intravenous Once  . amiodarone  200 mg Oral BID  . atorvastatin  40 mg Oral q1800  . cefTRIAXone (ROCEPHIN)  IV  1 g Intravenous Q24H  . chlorhexidine  15 mL Mouth/Throat BID  . darbepoetin (ARANESP) injection - DIALYSIS  100 mcg Intravenous Q Mon-HD  . DULoxetine  30 mg Oral Daily  . feeding supplement (NEPRO CARB STEADY)  237 mL Oral BID BM  . ferric gluconate (FERRLECIT/NULECIT) IV  125 mg Intravenous Daily  . insulin aspart  0-9 Units Subcutaneous TID AC & HS  . insulin glargine  5 Units Subcutaneous QHS  . levothyroxine  75 mcg Oral QAC breakfast  . polyethylene glycol  17 g Oral BID  . sildenafil  80 mg Oral TID  . sodium chloride irrigation  200 mL Irrigation Q2H while awake    Infusions: . sodium chloride 10 mL/hr at 08/29/14 0543  . DOBUTamine 2.5 mcg/kg/min (09/12/14 0323)  . heparin  1,800 Units/hr (09/11/14 2159)  . sodium chloride irrigation      PRN Medications: Place/Maintain arterial line **AND** sodium chloride, albuterol, ALPRAZolam, morphine injection, ondansetron (ZOFRAN) IV, oxyCODONE-acetaminophen **AND** oxyCODONE, sodium chloride, sodium chloride, temazepam   Assessment:  1. Left Hip Fracture s/p IM nail on 08/26/14 2. Chronic systolic CHF-  Nonischemic cardiomyopathy, on home milrinone 0.5 mcg/kg/min, decreased to 0.25 here with NSVT. Became hypotensive, now on dobutamine 2.5.  3. Acute on chronic renal failure - suspect post-op ATN.  CVVH => HD.  4.  Paroxysmal atrial fibrillation requiring DCCV 02/2014, 04/2014- on chronic amio and xarelto. CHADS VASc Score- 5.  Remains in NSR.  5. Diabetes mellitus type 2 6. Depression/anxiety 7. OSA, complex- Needs Bipap at night but had difficulty after dental procedure.  8 Pulmonary HTN - Mixed pulmonary venous hypertension and PAH- on Revatio 80 mg tid 9. Dental extractions 08/2014 by Dr Enrique Sack 10. Obesity- Body mass index is 32.39 kg/(m^2). 11. Probable chronic respiratory failure - requiring home O2 with ambulation as of this admission 12. Hyponatremia   13. NSVT 14. Delirium- Improved.   Plan/Discussion:    Suspect post-op ATN, oliguric.  Now getting HD (off CVVH).  He tolerated well 8/1, plan HD again today.  Very minimal UOP despite IV Lasix.  CVP rising and weight up, he is getting quite volume overloaded now.  It looks like he is going to need long-term HD.  This is going to be a problem with dobutamine.  If he tolerates HD on dobutamine, would it be an option to continue this and do HD long-term while on dobutamine? Would PD be an option? Will need to discuss with Dr Moshe Cipro.   Switched over now to dobutamine, think this will be safer than milrinone with intermittent HD.  Co-ox has been ok.    Revatio for pulmonary hypertension.   Continue amiodarone 200 mg bid.  Sinus rhythm with occasional PVCs, no  atrial fibrillation. Long PR interval noted on EKG. Continue amiodarone. Off Metoprolol XL with soft BP. Off Xarelto due to renal failure. Now on heparin gtt => restart warfarin after he gets his permanent vascular access later in the week.   Continue PT/OT.   Length of Stay: Tuttle Wynn Kernes,MD 1:15 PM Advanced Heart Failure Team Pager [phone] (M-F; 7a - 4p)  Please contact [facility] for night-coverage after hours (4p -7a ) and weekends on [email or web address]

## 2014-09-12 NOTE — Consult Note (Signed)
Physical Medicine and Rehabilitation Consult  Reason for Consult: Left femur fracture complicated by multiple medical issues.  Referring Physician: Dr. Tyrell Antonio.    HPI: Larry [name] is a 64 y.o. male with history of CKD, OSA, non-obstructive CAD, PAF, NICM with severe CHF on milrinone who was admitted on 08/25/14 after a mechanical fall with inability to walk. He was found to have left femur fracture and underwent IM nailing for repair on 07/18 by Dr. Fredonia Highland.  Post op course complicated by acute on chronic renal failure requiring CRRT 7/23 - 7/31 and IHD 8/1.  He continues to have decrease in UOP with poor renal recovery and plans are for another HD session today.  He was placed on IV rocephin for serratia UTI.  Nephrology recommends tunneled dialysis catheter placement and expressed concerns about success of  HD on patient with inotrope support.  Therapy ongoing and he is showing improvement in activity tolerance as well as progress. CIR recommended for follow up therapy.     Review of Systems  Constitutional: Positive for malaise/fatigue.  HENT: Negative for hearing loss.   Eyes: Negative for double vision.  Respiratory: Positive for shortness of breath. Negative for cough and wheezing.   Cardiovascular: Positive for leg swelling. Negative for chest pain.  Gastrointestinal: Positive for heartburn, nausea and constipation. Negative for vomiting.  Genitourinary: Negative for urgency and frequency.  Musculoskeletal: Positive for myalgias and back pain.  Skin: Negative for itching and rash.  Neurological: Positive for dizziness and tingling. Negative for headaches.      Past Medical History  Diagnosis Date  . NICM (nonischemic cardiomyopathy)     a. NICM. EF 2011 25-30%. b. LHC 2009 nonobstructive CAD. c. h/o [employer] Jude ICD 2012. d. Echo 04/2013 - EF 25-30% with mildly decreased RV systolic function. e. Echo 01/2014: EF 20-25%, inf akinesis, RHC. e. Home milrinone started 4/16.   [name] CAD (coronary artery disease)     a. nonobstructive CAD by cath 2009 & 06/2014.  . Obesity   . CVA (cerebral vascular accident)     CVA 2007 without residual deficit  . Pituitary tumor      (nonfunctionging pituitary microadenoma) s/p gamma knife surgery at [facility] 2009 with neuropathy and retinopathy  . Depression   . Erectile dysfunction   . DDD (degenerative disc disease)     Chronic low back pain.   . OSA (obstructive sleep apnea)     a. 4/16 sleep study with severe OSA  . Monoclonal gammopathy   . CKD (chronic kidney disease), stage III   . 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 Hypogonadotropic hypogonadism   . Polycythemia, secondary     improved/resolved  . Hypercholesterolemia   . Back pain     F/B [name]  . Monoclonal gammopathy of unknown significance     per [name]  . Neck pain     F/B [name]  . PAF (paroxysmal atrial fibrillation) 06/04/10    a. On Xarelto. b. DCCV 02/2014, 04/2014 to NSR.  [name] History of diverticulitis of colon   . Type II or unspecified type diabetes mellitus with neurological manifestations, uncontrolled   . Nonproliferative diabetic retinopathy NOS(362.03)   . Ventricular tachycardia 10/25/13    appropriate ICD shock, VT CL 230-240 msec  . Confusion 02/01/2014  . Hypertension   . AICD (automatic cardioverter/defibrillator) present   . Anxiety   . Peripheral vascular disease     2007  . Hypothyroidism   .  PTSD (post-traumatic stress disorder)     a. Anxiety/PTSD from ICD shock  . Lupus anticoagulant positive     a. No h/o DVT. Saw Dr Lindi Adie with hematology.   . Pulmonary hypertension     a. Mixed pulmonary venous hypertension and PAH  . Chronic respiratory failure     a. As of 08/2014 requiring home O2 with ambulation due to desat.     Past Surgical History  Procedure Laterality Date  . Gamma knife surgery for pituitary tumor    . Tonsillectomy    . Cardiac defibrillator placement  02/19/10    By JA.   . Right  heart catheterization N/A 09/17/2013    Procedure: RIGHT HEART CATH;  Surgeon: [name], MD;  Location: [facility] CATH LAB;  Service: Cardiovascular;  Laterality: N/A;  . Cardiac catheterization    . Brain surgery    . Insert / replace / remove pacemaker    . Cardioversion N/A 02/14/2014    Procedure: CARDIOVERSION;  Surgeon: [name], MD;  Location: [place];  Service: Cardiovascular;  Laterality: N/A;  . Cardioversion N/A 05/02/2014    Procedure: CARDIOVERSION;  Surgeon: [name], MD;  Location: [facility] ENDOSCOPY;  Service: Cardiovascular;  Laterality: N/A;  . Right heart catheterization N/A 05/08/2014    Procedure: RIGHT HEART CATH;  Surgeon: [name], MD;  Location: [facility] CATH LAB;  Service: Cardiovascular;  Laterality: N/A;  . Cardiac catheterization N/A 06/26/2014    Procedure: Right/Left Heart Cath And Coronary Angiography;  Surgeon: [name], MD;  Location: [place] CV LAB;  Service: Cardiovascular;  Laterality: N/A;  . Cardiac catheterization N/A 08/21/2014    Procedure: Right Heart Cath;  Surgeon: [name], MD;  Location: [place] CV LAB;  Service: Cardiovascular;  Laterality: N/A;  . Multiple extractions with alveoloplasty N/A 08/22/2014    Procedure: Extraction of toothy #'s 3,5,6,7,9,10,11,12,13,14,20,21,22,23,27,28,29,30 with alveoloplasty and bilateral mandibular tori reductions;  Surgeon: Lenn Cal, DDS;  Location: [place];  Service: Oral Surgery;  Laterality: N/A;  . Cataract extraction    . Femur im nail Left 08/26/2014    Procedure: INTRAMEDULLARY (IM) NAIL FEMORAL;  Surgeon: [name], MD;  Location: [place];  Service: Orthopedics;  Laterality: Left;    Family History  Problem Relation Age of Onset  . Lung cancer    . Cancer Mother     skin  . Dementia Mother   . Depression Mother   . Heart disease Father 83  . Hypertension Father   . Lung cancer Father   . Obesity Father   . Obesity Sister   . Hypertension Sister   .  Diabetes Brother   . Hypertension Brother   . Heart disease Brother 45    Died of stroke MI age 44  . Obesity Brother   . Lung cancer Paternal Uncle   . Diabetes Paternal Uncle     requiring leg amputations     Social History:   Married. Independent PTA. He reports that he quit smoking about 31 years ago. His smoking use included Cigars. He has never used smokeless tobacco. He reports that he does not drink alcohol or use illicit drugs.    Allergies  Allergen Reactions  . Bydureon [Exenatide] Other (See Comments)    sweating  . Losartan Potassium Other (See Comments)    insomnia    Medications Prior to Admission  Medication Sig Dispense Refill  . ALPRAZolam (XANAX) 0.5 MG tablet Take 0.5 mg  by mouth See admin instructions. Take 1 tablet (0.5 mg) every night, may take an additional tablet two more times during the day as needed for anxiety    . amiodarone (PACERONE) 200 MG tablet Take 1 tablet (200 mg total) by mouth daily. 30 tablet 3  . atorvastatin (LIPITOR) 40 MG tablet TAKE 1 TABLET DAILY 90 tablet 3  . bisacodyl (DULCOLAX) 5 MG EC tablet Take 5 mg by mouth at bedtime.    . chlorhexidine (PERIDEX) 0.12 % solution Perform mouth rinses twice daily after breakfast and at bedtime. (Patient taking differently: Use as directed in the mouth or throat 2 (two) times daily. Perform mouth rinses twice daily after breakfast and at bedtime (swish and spit)) 120 mL 0  . digoxin (LANOXIN) 0.125 MG tablet Take 0.5 tablets (0.0625 mg total) by mouth daily. 90 tablet 3  . DULoxetine (CYMBALTA) 30 MG capsule Take 60 mg by mouth 2 (two) times daily.     [name] GLUCAGON EMERGENCY 1 MG injection Inject 1 mg as directed once as needed (hypotension).     . hydrALAZINE (APRESOLINE) 100 MG tablet Take 1 tablet (100 mg total) by mouth 3 (three) times daily. 90 tablet 1  . insulin glargine (LANTUS) 100 unit/mL SOPN Inject 30 Units into the skin at bedtime.    . insulin lispro (HUMALOG KWIKPEN) 100 UNIT/ML  KiwkPen Inject 30 Units into the skin 3 (three) times daily before meals.    [name] levothyroxine (SYNTHROID, LEVOTHROID) 75 MCG tablet TAKE 1 TABLET ON AN EMPTY STOMACH 30 MINUTES BEFORE BREAKFAST  5  . Melatonin 10 MG TABS Take 10 mg by mouth at bedtime as needed (sleep).     . metoprolol succinate (TOPROL-XL) 25 MG 24 hr tablet Take 1 tablet (25 mg total) by mouth 2 (two) times daily before a meal. 60 tablet 6  . milrinone (PRIMACOR) 20 MG/100ML SOLN infusion Inject 51.2 mcg/min into the vein continuous. (0.52mcg/kg/min)    . Multiple Vitamin (MULITIVITAMIN WITH MINERALS) TABS Take 1 tablet by mouth daily.    [name] oxyCODONE-acetaminophen (PERCOCET) 10-325 MG per tablet Take 1 tablet by mouth every 4 (four) hours as needed for pain.   0  . Potassium Chloride ER 20 MEQ TBCR Take 20 mEq by mouth 2 (two) times daily. 60 tablet 0  . rivaroxaban (XARELTO) 15 MG TABS tablet Take 1 tablet (15 mg total) by mouth daily with supper. 30 tablet 3  . sildenafil (REVATIO) 20 MG tablet Take 4 tablets (80 mg total) by mouth 3 (three) times daily. 360 tablet 1  . spironolactone (ALDACTONE) 25 MG tablet TAKE 1 TABLET DAILY 90 tablet 2  . torsemide (DEMADEX) 20 MG tablet Take 3 tablets (60 mg total) by mouth 2 (two) times daily. 180 tablet 1  . zolpidem (AMBIEN) 5 MG tablet Take 1 tablet (5 mg total) by mouth at bedtime as needed for sleep. 30 tablet 0  . oxyCODONE-acetaminophen (PERCOCET/ROXICET) 5-325 MG per tablet Take 1-2 tablets by mouth every 4-6 hours as needed for moderate-severe pain. (Patient not taking: Reported on 08/25/2014) 40 tablet 0    Home: Home Living Family/patient expects to be discharged to:: Private residence Living Arrangements: Spouse/significant other Available Help at Discharge: Family, Available 24 hours/day Type of Home: House Home Access: Stairs to enter [employer] of Steps: 2 Entrance Stairs-Rails: None Home Layout: Two level, Bed/bath upstairs, 1/2 bath on main  level Alternate Level Stairs-Number of Steps: 14 Alternate Level Stairs-Rails: Right Bathroom Shower/Tub: Walk-in shower (on second floor)  Bathroom Toilet: Standard Home Equipment: None  Functional History: Prior Function Level of Independence: Independent Comments: pt normally with limited gait distance secondary to back pain, assisted sometimes with LB dressing due to back pain Functional Status:  Mobility: Bed Mobility Overal bed mobility: Modified Independent Bed Mobility: Supine to Sit Supine to sit: Modified independent (Device/Increase time), HOB elevated, +2 for safety/equipment (Extensive management of lines and leads.) Sit to supine: Mod assist General bed mobility comments: Patient was able to talk through steps of bed mobility and was able to get himself to sitting using the bed rail to pull himself up with HOB raised to 60 degrees. Needs extensive lines and leads management.  Transfers Overall transfer level: Needs assistance Equipment used: Rolling walker (2 wheeled) Transfers: Sit to/from Stand Sit to Stand: +2 safety/equipment, Min assist (Needed standing balance assistance during 2nd sit to stand.) Stand pivot transfers: +2 physical assistance, Min guard General transfer comment: Patient was able to verbalize reminder to push from the bed and the arm rests of the recliner without cueing. Patient minimally unsteady on feet after rest break and second sit to stand. Patient requires cueing for safe sequencing of stand to sit transfers. Ambulation/Gait Ambulation/Gait assistance: Min guard, +2 safety/equipment Ambulation Distance (Feet): 25 Feet Assistive device: Rolling walker (2 wheeled) Gait Pattern/deviations: Step-to pattern, Decreased step length - right, Decreased stance time - left, Decreased weight shift to left, Shuffle, Narrow base of support General Gait Details: Was able to walk to the hallway, took a seated rest break, and took a few more steps after that.  Patient was fatigued. Patient required moderate verbal cues to look up, and large gait abnormalities remain. Gait velocity: decreased Gait velocity interpretation: <1.8 ft/sec, indicative of risk for recurrent falls    ADL: ADL Overall ADL's : Needs assistance/impaired Eating/Feeding: Set up, Bed level Grooming: Wash/dry hands, Wash/dry face, Bed level, Set up Upper Body Bathing: Minimal assitance, Sitting Lower Body Bathing: Total assistance, Sit to/from stand, +2 for physical assistance Upper Body Dressing : Minimal assistance, Sitting Lower Body Dressing: Total assistance, Sitting/lateral leans Lower Body Dressing Details (indicate cue type and reason): doffed socks Toilet Transfer: +2 for physical assistance, Min guard, Stand-pivot, BSC Toilet Transfer Details (indicate cue type and reason): stood to use urinal Toileting- Clothing Manipulation and Hygiene: Total assistance, Sit to/from stand Toileting - Clothing Manipulation Details (indicate cue type and reason): pt unable to release walker for UE use Functional mobility during ADLs: +2 for physical assistance, Minimal assistance, Rolling walker General ADL Comments: Pt fatigued after being up in chair x 3 1/2 hours and toileting.  Cognition: Cognition Overall Cognitive Status: Impaired/Different from baseline (Seemed a little different today, but still oriented.) Orientation Level: Oriented X4 Cognition Arousal/Alertness: Awake/alert Behavior During Therapy: WFL for tasks assessed/performed Overall Cognitive Status: Impaired/Different from baseline (Seemed a little different today, but still oriented.) Area of Impairment: Memory Orientation Level: Disoriented to, Time Current Attention Level: Alternating Memory: Decreased short-term memory Following Commands: Follows one step commands consistently, Follows one step commands with increased time Safety/Judgement: Decreased awareness of safety, Decreased awareness of  deficits Problem Solving: Slow processing, Decreased initiation, Difficulty sequencing, Requires verbal cues, Requires tactile cues General Comments: Cognition was appropriate for all tasks assessed.  Blood pressure 97/50, pulse 72, temperature 98.1 F (36.7 C), temperature source Oral, resp. rate 11, height 5\' 10"  (1.778 m), weight 103.2 kg (227 lb 8.2 oz), SpO2 100 %. Physical Exam  Nursing note and vitals reviewed. Constitutional: He is oriented to person,  place, and time. He appears well-developed and well-nourished. He is easily aroused.  Fatigued appearing.   HENT:  Head: Normocephalic and atraumatic.  Eyes: Conjunctivae are normal. Pupils are equal, round, and reactive to light.  Neck:  Central line in place right neck.   Cardiovascular: Normal rate.  An irregular rhythm present.  Respiratory: Effort normal and breath sounds normal. He has no wheezes. He has no rales. He exhibits no tenderness.  GI: Soft. Bowel sounds are normal. He exhibits distension. There is no tenderness. There is no rebound.  Musculoskeletal: He exhibits edema (1-2+ edema BLE). He exhibits no tenderness.  LLE limited due to pain inhibition.   Neurological: He is alert, oriented to person, place, and time and easily aroused.  Left leg limited due to pain. No focal sensory loss. LLE 1-2/5 prox to 4/5 distal. RLE: 3- prox to 4/5 distally at ankle. UE's 4/5 prox to distal. Reasonable insight and awareness.   Skin: Skin is warm and dry. No rash noted. No erythema.  Psychiatric: He has a normal mood and affect. His behavior is normal.    Results for orders placed or performed during the hospital encounter of 08/25/14 (from the past 24 hour(s))  Glucose, capillary     Status: Abnormal   Collection Time: 09/11/14  4:45 PM  Result Value Ref Range   Glucose-Capillary 119 (H) 65 - 99 mg/dL  Glucose, capillary     Status: Abnormal   Collection Time: 09/11/14  7:01 PM  Result Value Ref Range   Glucose-Capillary 126  (H) 65 - 99 mg/dL  Glucose, capillary     Status: Abnormal   Collection Time: 09/11/14  9:34 PM  Result Value Ref Range   Glucose-Capillary 136 (H) 65 - 99 mg/dL  Carboxyhemoglobin     Status: Abnormal   Collection Time: 09/12/14  6:20 AM  Result Value Ref Range   Total hemoglobin 8.8 (L) 13.5 - 18.0 g/dL   O2 Saturation 95.8 %   Carboxyhemoglobin 1.6 (H) 0.5 - 1.5 %   Methemoglobin 1.0 0.0 - 1.5 %  Magnesium     Status: None   Collection Time: 09/12/14  6:30 AM  Result Value Ref Range   Magnesium 2.2 1.7 - 2.4 mg/dL  Heparin level (unfractionated)     Status: Abnormal   Collection Time: 09/12/14  6:30 AM  Result Value Ref Range   Heparin Unfractionated 1.88 (H) 0.30 - 0.70 IU/mL  CBC     Status: Abnormal   Collection Time: 09/12/14  6:30 AM  Result Value Ref Range   WBC 8.4 4.0 - 10.5 K/uL   RBC 2.79 (L) 4.22 - 5.81 MIL/uL   Hemoglobin 8.4 (L) 13.0 - 17.0 g/dL   HCT 27.0 (L) 39.0 - 52.0 %   MCV 96.8 78.0 - 100.0 fL   MCH 30.1 26.0 - 34.0 pg   MCHC 31.1 30.0 - 36.0 g/dL   RDW 16.2 (H) 11.5 - 15.5 %   Platelets 229 150 - 400 K/uL  Renal function panel     Status: Abnormal   Collection Time: 09/12/14  6:30 AM  Result Value Ref Range   Sodium 125 (L) 135 - 145 mmol/L   Potassium 4.8 3.5 - 5.1 mmol/L   Chloride 90 (L) 101 - 111 mmol/L   CO2 24 22 - 32 mmol/L   Glucose, Bld 129 (H) 65 - 99 mg/dL   BUN 45 (H) 6 - 20 mg/dL   Creatinine, Ser 4.87 (H) 0.61 - 1.24 mg/dL  Calcium 8.6 (L) 8.9 - 10.3 mg/dL   Phosphorus 6.0 (H) 2.5 - 4.6 mg/dL   Albumin 2.5 (L) 3.5 - 5.0 g/dL   GFR calc non Af Amer 12 (L) >60 mL/min   GFR calc Af Amer 13 (L) >60 mL/min   Anion gap 11 5 - 15  Glucose, capillary     Status: Abnormal   Collection Time: 09/12/14  8:17 AM  Result Value Ref Range   Glucose-Capillary 124 (H) 65 - 99 mg/dL  Heparin level (unfractionated)     Status: None   Collection Time: 09/12/14 11:32 AM  Result Value Ref Range   Heparin Unfractionated 0.54 0.30 - 0.70 IU/mL   Glucose, capillary     Status: Abnormal   Collection Time: 09/12/14 12:29 PM  Result Value Ref Range   Glucose-Capillary 141 (H) 65 - 99 mg/dL   Dg Abd 1 View  09/12/2014   CLINICAL DATA:  Constipation, abdominal tenderness, history of diverticulitis and diabetes.  EXAM: ABDOMEN - 1 VIEW  COMPARISON:  Abdominal film of August 30, 2014  FINDINGS: There is mild gaseous distention of the stomach and of the colon. There is a moderate stool burden in the rectosigmoid. There is no small bowel obstructive pattern. There are no free extraluminal gas collections. There are degenerative changes of the lumbar spine. There is a rounded calcific density in the pelvis that likely reflects an osteophyte at the L5-S1 level.  IMPRESSION: Moderate gaseous distention of the stomach and colon. The pattern does not appear obstructive however. When compared to the study of July 22nd the volume of colonic gas has decreased somewhat. There is no evidence of perforation.   Electronically Signed   By: Johncarlos  Martinique M.D.   On: 09/12/2014 09:18    Assessment/Plan: Diagnosis: left intertrochanteric hip fracture with multiple post-op complications and subsequent deconditioning 1. Does the need for close, 24 hr/day medical supervision in concert with the patient's rehab needs make it unreasonable for this patient to be served in a less intensive setting? Yes 2. Co-Morbidities requiring supervision/potential complications: htn, afib,  3. Due to bladder management, bowel management, safety, skin/wound care, disease management, medication administration, pain management and patient education, does the patient require 24 hr/day rehab nursing? Yes 4. Does the patient require coordinated care of a physician, rehab nurse, PT (1-2 hrs/day, 5 days/week) and OT (1-2 hrs/day, 5 days/week) to address physical and functional deficits in the context of the above medical diagnosis(es)? Yes Addressing deficits in the following areas: balance,  endurance, locomotion, strength, transferring, bowel/bladder control, bathing, dressing, feeding, grooming, toileting and psychosocial support 5. Can the patient actively participate in an intensive therapy program of at least 3 hrs of therapy per day at least 5 days per week? Yes 6. The potential for patient to make measurable gains while on inpatient rehab is excellent 7. Anticipated functional outcomes upon discharge from inpatient rehab are modified independent  with PT, modified independent and supervision with OT, n/a with SLP. 8. Estimated rehab length of stay to reach the above functional goals is: 7-10 days 9. Does the patient have adequate social supports and living environment to accommodate these discharge functional goals? Yes 10. Anticipated D/C setting: Home 11. Anticipated post D/C treatments: HH therapy and Outpatient therapy 12. Overall Rehab/Functional Prognosis: excellent  RECOMMENDATIONS: This patient's condition is appropriate for continued rehabilitative care in the following setting: CIR Patient has agreed to participate in recommended program. Yes Note that insurance prior authorization may be required for reimbursement for  recommended care.  Comment: Rehab Admissions Coordinator to follow up.   Thanks,  Meredith Staggers, MD, Mellody Drown     09/12/2014

## 2014-09-13 ENCOUNTER — Inpatient Hospital Stay (HOSPITAL_COMMUNITY): Payer: Commercial Managed Care - HMO

## 2014-09-13 DIAGNOSIS — S72142S Displaced intertrochanteric fracture of left femur, sequela: Secondary | ICD-10-CM

## 2014-09-13 DIAGNOSIS — R5381 Other malaise: Secondary | ICD-10-CM | POA: Insufficient documentation

## 2014-09-13 LAB — BASIC METABOLIC PANEL
Anion gap: 11 (ref 5–15)
BUN: 24 mg/dL — ABNORMAL HIGH (ref 6–20)
CO2: 27 mmol/L (ref 22–32)
CREATININE: 3.47 mg/dL — AB (ref 0.61–1.24)
Calcium: 8.2 mg/dL — ABNORMAL LOW (ref 8.9–10.3)
Chloride: 93 mmol/L — ABNORMAL LOW (ref 101–111)
GFR calc Af Amer: 20 mL/min — ABNORMAL LOW (ref >60)
GFR calc non Af Amer: 17 mL/min — ABNORMAL LOW (ref >60)
Glucose, Bld: 85 mg/dL (ref 65–99)
Potassium: 4 mmol/L (ref 3.5–5.1)
Sodium: 131 mmol/L — ABNORMAL LOW (ref 135–145)

## 2014-09-13 LAB — GLUCOSE, CAPILLARY
GLUCOSE-CAPILLARY: 115 mg/dL — AB (ref 65–99)
Glucose-Capillary: 85 mg/dL (ref 65–99)
Glucose-Capillary: 99 mg/dL (ref 65–99)
Glucose-Capillary: 104 mg/dL — ABNORMAL HIGH (ref 65–99)

## 2014-09-13 LAB — CARBOXYHEMOGLOBIN
Carboxyhemoglobin: 1.8 % — ABNORMAL HIGH (ref 0.5–1.5)
Methemoglobin: 0.9 % (ref 0.0–1.5)
O2 SAT: 70.9 %
Total hemoglobin: 9.2 g/dL — ABNORMAL LOW (ref 13.5–18.0)

## 2014-09-13 LAB — CBC
HEMATOCRIT: 24 % — AB (ref 39.0–52.0)
HEMOGLOBIN: 7.6 g/dL — AB (ref 13.0–17.0)
MCH: 30.4 pg (ref 26.0–34.0)
MCHC: 31.7 g/dL (ref 30.0–36.0)
MCV: 96 fL (ref 78.0–100.0)
PLATELETS: 201 10*3/uL (ref 150–400)
RBC: 2.5 MIL/uL — ABNORMAL LOW (ref 4.22–5.81)
RDW: 16.3 % — AB (ref 11.5–15.5)
WBC: 8 10*3/uL (ref 4.0–10.5)

## 2014-09-13 LAB — HEPARIN LEVEL (UNFRACTIONATED): HEPARIN UNFRACTIONATED: 0.36 [IU]/mL (ref 0.30–0.70)

## 2014-09-13 LAB — PREPARE RBC (CROSSMATCH)

## 2014-09-13 MED ORDER — FENTANYL CITRATE (PF) 100 MCG/2ML IJ SOLN
INTRAMUSCULAR | Status: DC | PRN
  Administered 2014-09-13: 50 ug via INTRAVENOUS

## 2014-09-13 MED ORDER — HEPARIN SODIUM (PORCINE) 1000 UNIT/ML IJ SOLN
INTRAMUSCULAR | Status: AC
  Filled 2014-09-13: qty 1

## 2014-09-13 MED ORDER — BISACODYL 10 MG RE SUPP
10.0000 mg | Freq: Once | RECTAL | Status: AC
  Administered 2014-09-13: 10 mg via RECTAL
  Filled 2014-09-13: qty 1

## 2014-09-13 MED ORDER — FENTANYL CITRATE (PF) 100 MCG/2ML IJ SOLN
INTRAMUSCULAR | Status: AC
  Filled 2014-09-13: qty 2

## 2014-09-13 MED ORDER — CHLORHEXIDINE GLUCONATE 4 % EX LIQD
CUTANEOUS | Status: AC
  Filled 2014-09-13: qty 15

## 2014-09-13 MED ORDER — SODIUM CHLORIDE 0.9 % IV SOLN: Freq: Once | INTRAVENOUS | Status: DC

## 2014-09-13 MED ORDER — MIDAZOLAM HCL 2 MG/2ML IJ SOLN
INTRAMUSCULAR | Status: DC | PRN
  Administered 2014-09-13: 1 mg via INTRAVENOUS
  Administered 2014-09-13: 0.5 mg via INTRAVENOUS

## 2014-09-13 MED ORDER — HEPARIN (PORCINE) IN NACL 100-0.45 UNIT/ML-% IJ SOLN
1850.0000 [IU]/h | INTRAMUSCULAR | Status: DC
  Administered 2014-09-13 – 2014-09-14 (×3): 1850 [IU]/h via INTRAVENOUS
  Filled 2014-09-13 (×6): qty 250

## 2014-09-13 MED ORDER — AMIODARONE HCL 200 MG PO TABS
200.0000 mg | ORAL_TABLET | Freq: Every day | ORAL | Status: DC
  Administered 2014-09-13 – 2014-09-14 (×2): 200 mg via ORAL
  Filled 2014-09-13: qty 1

## 2014-09-13 MED ORDER — CEFAZOLIN SODIUM-DEXTROSE 2-3 GM-% IV SOLR
INTRAVENOUS | Status: AC
  Filled 2014-09-13: qty 50

## 2014-09-13 MED ORDER — LIDOCAINE HCL 1 % IJ SOLN
INTRAMUSCULAR | Status: AC
  Filled 2014-09-13: qty 20

## 2014-09-13 MED ORDER — MIDAZOLAM HCL 2 MG/2ML IJ SOLN
INTRAMUSCULAR | Status: AC
  Filled 2014-09-13: qty 2

## 2014-09-13 NOTE — Sedation Documentation (Signed)
Patient is resting, anxious.

## 2014-09-13 NOTE — Progress Notes (Signed)
OT Cancellation Note  Patient Details Name: Larry [name] MRN: [id number] DOB: April 09, 1950   Cancelled Treatment:    Reason Eval/Treat Not Completed: Patient at procedure or test/ unavailable (Will continue to follow.)  [name] 09/13/2014, 10:02 AM  [phone]

## 2014-09-13 NOTE — Procedures (Signed)
Interventional Radiology Procedure Note  Procedure:  1.) Removal right IJ temp cath 2.) Placement right IJ permcath (23 cm Palindrome) with tip sin mid RA and ready for use.   Complications: None  Estimated Blood Loss: 0  Recommendations: - Routine line care  Signed,  Criselda Peaches, MD

## 2014-09-13 NOTE — Sedation Documentation (Signed)
Patient is resting comfortably. 

## 2014-09-13 NOTE — Progress Notes (Signed)
[facility] PROGRESS NOTE  TRAMOND SLINKER EUM:[id number] DOB: 1950-06-24 DOA: 08/25/2014 PCP: [name], MD   Interim summary 64 y/o man with history of chronic systolic CHF (nonischemic cardiomyopathy) s/p St. 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 He is on chronic milrinone 0.5 mcg. Admitted with fall---> broken L hip . 7/18 had IM Nail L femur. Course complicated with fluid overload and acute on chronic renal failure (due to ATN most liekely). Required CVVHD 7/23>> 7/31. Now on intermittent HD and being assessed for ESRD and HD dependency .  Assessment/Plan: 1. Severe nonischemic cardiomyopathy with chronic systolic heart failure with an EF of 25%. History of V. tach. Patient has AICD, on home milrinone drip. Experienced hypotension requiring norepinephrine and now on dobutamine; plan is to continue dobutamine instead of milrinone. -Patient was started on CVVHD on 08/31/2014 until 09/08/2014 -Cardiology managing HF meds -He tolerated CVVHD; but has remained oliguric and was initiated on IHD (8/1), 8-4 -2L removed during HD on 8/1 -weight 100 Kg ---102---103 -Had HD 8-4.  -Continue with dobutamine. Plan for HD 8-6  2.  Acute on chronic renal failure -Xarelto was discontinued and started on IV heparin -Nephrology was consulted, feeling renal failure likely represents postoperative ATN/hypoperfusion from hypotension -CVVH was started on 08/31/2014 and stopped on 7/31 -still oliguric, LE edema. Plan for HD 8-4  -Nephrology managing  -plan is to observed and continue HD intermittently as needed last treatment on 8/1  -most likely will ended on permanent HD -if renal function recovers, patient might need Flomax prior to discontinuation of foley catheter.   3. Constipation -Patient w/o diarrhea; reports BM are ok now. -last BM 8/1 -KUB negative for obstruction. Change miralax to BID>  -will  try dulcolax suppository.   4.  Hyponatremia -I suspect secondary to volume overload and renal failure -HD today.  -follow trend.   5. Mechanical trip and fall with left femoral intertrochanteric fracture.  -status post intramedullary nail to left hip on 7/18 -WBAT -PT/OT evaluation  6. Paroxysmal atrial fibrillation and pulmonary HTN.  -Xarelto discontinued due to worsening kidney function, now on IV heparin and with plans for coumadin eventually -On Amio -continue revatio for pulm HTN -hold coumadin in case he required permanent access for dialysis. Discussed with renal.   7. Obstructive sleep apnea.  -Patient refusing BiPAP;   8. DM type II. recent A1c of 7.7 -lantus changed to 5 units along with SSI since 7/28, he had hypoglycemia on 7/27 -follow CBG's and adjust as needed -no further hypoglycemic events; eating ok  9. Insomnia: -will continue using restoril PRN   10-UTI; UA with too numerous to count WBC. Started on ceftriaxone 8-03. urine culture growing serratia/ sensitive to ceftriaxone./   Day 3/7.   11-Stage 2 sacrum: Wound care consult.  12-Anemia; discussed with renal ok to give one unit PRBC.   Code Status: Full Family Communication: wife at bedside Disposition Plan: Patient in the process of been assessed for ESRD. On Dobutamine drip for heart failure.    Consultants:  Ortho (Dr. Percell Miller)  HF team   Nephrology  Procedures:  S/P intramedullary nail on left hip on 7/18  CVVH   Antibiotics:  ceftriaxone  HPI/Subjective: He is feeling tired. He is breathing ok. No BM yet. No abdominal pain.     Objective: Filed Vitals:   09/13/14 0714  BP: 101/44  Pulse: 70  Temp: 97.9 F (36.6 C)  Resp:  19    Intake/Output Summary (Last 24 hours) at 09/13/14 0836 Last data filed at 09/13/14 0800  Gross per 24 hour  Intake  806.5 ml  Output    140 ml  Net  666.5 ml   Filed Weights   09/12/14 0500 09/12/14 1540 09/13/14 0401  Weight: 103.2 kg  (227 lb 8.2 oz) 104.7 kg (230 lb 13.2 oz) 103 kg (227 lb 1.2 oz)    Exam:   General:  He is  alert, oriented X 3, follows commands, chronically ill-appearing;   Cardiovascular:no rubs, no gallops, RRR  Respiratory: Normal respiratory effort, lungs are clear to auscultation bilaterally  Abdomen: soft, NT, ND, positive Bowel sounds  Musculoskeletal: Surgical incision site appears clean, no evidence of infection. Plus edema   Data Reviewed: Basic Metabolic Panel:  Recent Labs Lab 09/08/14 0430 09/09/14 0400 09/10/14 0525 09/11/14 0452 09/12/14 0630 09/13/14 0530  NA 133* 128* 134* 131* 125* 131*  K 4.4 4.7 4.4 4.3 4.8 4.0  CL 99* 98* 97* 96* 90* 93*  CO2 27 24 28 27 24 27   GLUCOSE 126* 135* 99 126* 129* 85  BUN 7 20 18  31* 45* 24*  CREATININE 1.71* 3.18* 2.85* 3.94* 4.87* 3.47*  CALCIUM 8.5* 8.9 8.3* 8.6* 8.6* 8.2*  MG 2.3 2.3 2.2 2.3 2.2  --   PHOS 2.2* 3.3 4.1 5.2* 6.0*  --    CBC:  Recent Labs Lab 09/09/14 0400 09/10/14 0525 09/11/14 0452 09/12/14 0630 09/13/14 0530  WBC 11.8* 11.4* 9.2 8.4 8.0  HGB 9.3* 8.2* 8.1* 8.4* 7.6*  HCT 28.8* 26.3* 26.1* 27.0* 24.0*  MCV 97.6 97.0 97.4 96.8 96.0  PLT 216 187 202 229 201   BNP (last 3 results)  Recent Labs  05/22/14 1029 05/27/14 1126 06/19/14 1030  BNP 935.2* 1627.8* 738.9*    ProBNP (last 3 results)  Recent Labs  10/09/13 0926 11/14/13 0926 01/14/14 1112  PROBNP 1561.0* 1261.0* 5769.0*   CBG:  Recent Labs Lab 09/11/14 1901 09/11/14 2134 09/12/14 0817 09/12/14 1229 09/12/14 2122  GLUCAP 126* 136* 124* 141* 90    Recent Results (from the past 240 hour(s))  Urine culture     Status: None   Collection Time: 09/10/14  9:30 AM  Result Value Ref Range Status   Specimen Description URINE, RANDOM  Final   Special Requests NONE  Final   Culture >=100,000 COLONIES/mL SERRATIA MARCESCENS  Final   Report Status 09/12/2014 FINAL  Final   Organism ID, Bacteria SERRATIA MARCESCENS  Final       Susceptibility   Serratia marcescens - MIC*    CEFAZOLIN >=64 RESISTANT Resistant     CEFTRIAXONE <=1 SENSITIVE Sensitive     CIPROFLOXACIN <=0.25 SENSITIVE Sensitive     GENTAMICIN <=1 SENSITIVE Sensitive     NITROFURANTOIN 128 RESISTANT Resistant     TRIMETH/SULFA <=20 SENSITIVE Sensitive     * >=100,000 COLONIES/mL SERRATIA MARCESCENS     Studies: Dg Abd 1 View  09/12/2014   CLINICAL DATA:  Constipation, abdominal tenderness, history of diverticulitis and diabetes.  EXAM: ABDOMEN - 1 VIEW  COMPARISON:  Abdominal film of August 30, 2014  FINDINGS: There is mild gaseous distention of the stomach and of the colon. There is a moderate stool burden in the rectosigmoid. There is no small bowel obstructive pattern. There are no free extraluminal gas collections. There are degenerative changes of the lumbar spine. There is a rounded calcific density in the pelvis that likely reflects an osteophyte at the  L5-S1 level.  IMPRESSION: Moderate gaseous distention of the stomach and colon. The pattern does not appear obstructive however. When compared to the study of July 22nd the volume of colonic gas has decreased somewhat. There is no evidence of perforation.   Electronically Signed   By: Naheem  Martinique M.D.   On: 09/12/2014 09:18    Scheduled Meds: . sodium chloride   Intravenous Once  . sodium chloride  10 mL/hr Intravenous Once  . amiodarone  200 mg Oral Daily  . atorvastatin  40 mg Oral q1800  . cefTRIAXone (ROCEPHIN)  IV  1 g Intravenous Q24H  . chlorhexidine  15 mL Mouth/Throat BID  . darbepoetin (ARANESP) injection - DIALYSIS  100 mcg Intravenous Q Mon-HD  . DULoxetine  30 mg Oral Daily  . feeding supplement (NEPRO CARB STEADY)  237 mL Oral BID BM  . fentaNYL      . ferric gluconate (FERRLECIT/NULECIT) IV  125 mg Intravenous Daily  . heparin      . insulin aspart  0-9 Units Subcutaneous TID AC & HS  . levothyroxine  75 mcg Oral QAC breakfast  . lidocaine      . midazolam      .  polyethylene glycol  17 g Oral BID  . sildenafil  80 mg Oral TID  . sodium chloride irrigation  200 mL Irrigation Q2H while awake   Continuous Infusions: . sodium chloride 10 mL/hr at 08/29/14 0543  . DOBUTamine 2.5 mcg/kg/min (09/12/14 0323)  . heparin Stopped (09/13/14 0615)  . sodium chloride irrigation      Principal Problem:   Fracture, intertrochanteric, left femur Active Problems:   Essential hypertension, benign   Persistent atrial fibrillation   Automatic implantable cardioverter-defibrillator in situ   DM2 (diabetes mellitus, type 2)   OSA (obstructive sleep apnea), intolerant to CPAP/BIPAP   Ventricular tachycardia   Chronic systolic CHF (congestive heart failure)   Hip fracture   Left femoral shaft fracture   Acute on chronic renal failure   Hyponatremia   Acute on chronic systolic CHF (congestive heart failure)   Acute renal failure superimposed on stage 4 chronic kidney disease   SOB (shortness of breath)   Insomnia   Oliguria   Adjustment disorder with anxious mood   Chronic atrial fibrillation   Pressure ulcer   Time spent: 30 minutes   Nema Oatley, [facility] Pager [phone]. If 7PM-7AM, please contact night-coverage at [email or web address], password [facility] 09/13/2014, 8:36 AM  LOS: 19 days

## 2014-09-13 NOTE — Progress Notes (Signed)
Requested for his dulcolax supp. to be given tonight.

## 2014-09-13 NOTE — Progress Notes (Signed)
Subjective:  Minimal UOP - had HD yesterday- had to be stopped a little early due to arrythmia - this AM s/p conversion of vascath to PC Objective Vital signs in last 24 hours: Filed Vitals:   09/13/14 0913 09/13/14 0918 09/13/14 0923 09/13/14 0928  BP: 102/46 102/53 104/51 112/48  Pulse: 69 71 70 71  Temp:      TempSrc:      Resp: 15 12 20 15   Height:      Weight:      SpO2: 98% 100% 100% 99%   Weight change: 1.5 kg (3 lb 4.9 oz)  Intake/Output Summary (Last 24 hours) at 09/13/14 1049 Last data filed at 09/13/14 1000  Gross per 24 hour  Intake  684.2 ml  Output    140 ml  Net  544.2 ml    Assessment/ Plan: Pt is a 64 y.o. yo male with baseline nonischemic cardiomyopathy on milrinone with stage 3 CKD who was admitted on 08/25/2014 with s/p hip fx- had A on CRF - now HD requiring  Assessment/Plan: 1. Renal- CKD at baseline- with events became dialysis requiring- on CRRT from 7/23- 7/31. Had IHD 8/1 and on 8/4. Unfortunately- his kidneys are not improving off of HD- will plan another IHD treatment tomorrow. Vascath converted  to [facility] on 8/5.  As far as long term plans- doing HD on someone on an inotrope successfully  has not been done  as far as I know.  We need to hope that he will either recover enough renal function to stay off of HD (crt in June was 2.4)  Or need to wean the dobutamine in order to do hemodialysis as an OP- cards does not think this would work- also had arrythmia with HD yest so unclear if will be stable enough to do HD  PD could also maybe be an option but have had logistic issues related to placing pt on PD as inpatient in past. We will need to tune him up as much as possible before even asking surgery about placing a PD cath.  This is a very difficult situation. Wife is appropriately upset to hear this news 2. Anemia- hgb falling with events and heparin- repleting iron and have added aranesp- needs blood today- have discussed with primary team will give one unit and  assess for further need with HD tomorrow  3. HTN/volume-   Is very  overloaded-   Can not tell how much fluid was removed yesterday   Baseline cardiomyopathy is complicating situation but surprisingly blood pressure is reasonable.  Lasix is not being effective has been stopped- continue with UF as able with HD 4. S/p hip fx- operative repair on 7/19- planning on SNF eventually- cannot be discharged until dialysis dispo is decided  5. Urine looks cloudy- - looked like UTI- rocephin started - will d/c foley   Kassadie Pancake A    Labs: Basic Metabolic Panel:  Recent Labs Lab 09/10/14 0525 09/11/14 0452 09/12/14 0630 09/13/14 0530  NA 134* 131* 125* 131*  K 4.4 4.3 4.8 4.0  CL 97* 96* 90* 93*  CO2 28 27 24 27   GLUCOSE 99 126* 129* 85  BUN 18 31* 45* 24*  CREATININE 2.85* 3.94* 4.87* 3.47*  CALCIUM 8.3* 8.6* 8.6* 8.2*  PHOS 4.1 5.2* 6.0*  --    Liver Function Tests:  Recent Labs Lab 09/07/14 0445  09/10/14 0525 09/11/14 0452 09/12/14 0630  AST 31  --   --   --   --  ALT 12*  --   --   --   --   ALKPHOS 79  --   --   --   --   BILITOT 0.8  --   --   --   --   PROT 6.7  --   --   --   --   ALBUMIN 2.8*  < > 2.4* 2.4* 2.5*  < > = values in this interval not displayed. No results for input(s): LIPASE, AMYLASE in the last 168 hours. No results for input(s): AMMONIA in the last 168 hours. CBC:  Recent Labs Lab 09/09/14 0400 09/10/14 0525 09/11/14 0452 09/12/14 0630 09/13/14 0530  WBC 11.8* 11.4* 9.2 8.4 8.0  HGB 9.3* 8.2* 8.1* 8.4* 7.6*  HCT 28.8* 26.3* 26.1* 27.0* 24.0*  MCV 97.6 97.0 97.4 96.8 96.0  PLT 216 187 202 229 201   Cardiac Enzymes: No results for input(s): CKTOTAL, CKMB, CKMBINDEX, TROPONINI in the last 168 hours. CBG:  Recent Labs Lab 09/11/14 2134 09/12/14 0817 09/12/14 1229 09/12/14 2122 09/13/14 0713  GLUCAP 136* 124* 141* 90 85    Iron Studies:  No results for input(s): IRON, TIBC, TRANSFERRIN, FERRITIN in the last 72  hours. Studies/Results: Dg Abd 1 View  09/12/2014   CLINICAL DATA:  Constipation, abdominal tenderness, history of diverticulitis and diabetes.  EXAM: ABDOMEN - 1 VIEW  COMPARISON:  Abdominal film of August 30, 2014  FINDINGS: There is mild gaseous distention of the stomach and of the colon. There is a moderate stool burden in the rectosigmoid. There is no small bowel obstructive pattern. There are no free extraluminal gas collections. There are degenerative changes of the lumbar spine. There is a rounded calcific density in the pelvis that likely reflects an osteophyte at the L5-S1 level.  IMPRESSION: Moderate gaseous distention of the stomach and colon. The pattern does not appear obstructive however. When compared to the study of July 22nd the volume of colonic gas has decreased somewhat. There is no evidence of perforation.   Electronically Signed   By: Jamario  Martinique M.D.   On: 09/12/2014 09:18   Medications: Infusions: . sodium chloride 10 mL/hr at 08/29/14 0543  . DOBUTamine 2.5 mcg/kg/min (09/12/14 0323)  . heparin    . sodium chloride irrigation      Scheduled Medications: . sodium chloride   Intravenous Once  . sodium chloride  10 mL/hr Intravenous Once  . sodium chloride   Intravenous Once  . amiodarone  200 mg Oral Daily  . atorvastatin  40 mg Oral q1800  . bisacodyl  10 mg Rectal Once  . ceFAZolin      . cefTRIAXone (ROCEPHIN)  IV  1 g Intravenous Q24H  . chlorhexidine      . chlorhexidine  15 mL Mouth/Throat BID  . darbepoetin (ARANESP) injection - DIALYSIS  100 mcg Intravenous Q Mon-HD  . DULoxetine  30 mg Oral Daily  . feeding supplement (NEPRO CARB STEADY)  237 mL Oral BID BM  . fentaNYL      . heparin      . insulin aspart  0-9 Units Subcutaneous TID AC & HS  . levothyroxine  75 mcg Oral QAC breakfast  . lidocaine      . midazolam      . polyethylene glycol  17 g Oral BID  . sildenafil  80 mg Oral TID  . sodium chloride irrigation  200 mL Irrigation Q2H while awake     have reviewed scheduled and prn medications.  Physical Exam: General: obese-  Heart: RRR Lungs: decreased BS at bases Abdomen: distended Extremities: pitting edema to dependent areas Dialysis Access: right IJ vascath placed 7/23 - approaching need for more permanent access later this week if still needed   09/13/2014,10:49 AM  LOS: 19 days

## 2014-09-13 NOTE — Progress Notes (Signed)
Patient ID: ELISEO WITHERS, male   DOB: 1950/12/26, 64 y.o.   MRN: [id number] Advanced Heart Failure Rounding Note   Subjective:    Mr. Thebeau is a 64 y/o man with history of chronic systolic CHF (nonischemic cardiomyopathy) s/p St. 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 He is on chronic milrinone 0.5 mcg.  Prior to admit he was being considered for LVAD.   Dischaged 7/16 after dental procedures. Also had RHC with elevate pulmonary pressures. Revatio was increased to 80 mg tid during that stay.  Admitted with fall---> broken L  hip . 7/18 had IM Nail L femur.  7/23 started CVVHD, CVVH stopped 7/31.   Hypotensive 7/28, stopped milrinone and started norepinephrine.  Pressure recovered, now off norepinephrine and on dobutamine 2.5 mcg/kg/min.  BP stable today.  He had HD again on 8/4, tolerated it well in terms of BP.  No dyspnea at rest, walked with PT.  CVP down to 13 post-HD.    Objective:   Weight Range:  Vital Signs:   Temp:  [97.5 F (36.4 C)-98.7 F (37.1 C)] 97.9 F (36.6 C) (08/05 0714) Pulse Rate:  [65-82] 70 (08/05 0714) Resp:  [10-26] 19 (08/05 0714) BP: (84-114)/(42-61) 101/44 mmHg (08/05 0714) SpO2:  [95 %-100 %] 97 % (08/05 0714) Weight:  [227 lb 1.2 oz (103 kg)-230 lb 13.2 oz (104.7 kg)] 227 lb 1.2 oz (103 kg) (08/05 0401) Last BM Date: 09/09/14  Weight change: Filed Weights   09/12/14 0500 09/12/14 1540 09/13/14 0401  Weight: 227 lb 8.2 oz (103.2 kg) 230 lb 13.2 oz (104.7 kg) 227 lb 1.2 oz (103 kg)    Intake/Output:   Intake/Output Summary (Last 24 hours) at 09/13/14 0752 Last data filed at 09/13/14 0700  Gross per 24 hour  Intake  812.7 ml  Output    140 ml  Net  672.7 ml     Physical Exam: General:  NAD  HEENT: normal Neck: supple. JVP 10 cm. RIJ trialysis catheter.  Carotids 2+ bilat; no bruits. No lymphadenopathy or thryomegaly appreciated. Cor: PMI nondisplaced.  Regular rate & rhythm. No rubs, gallops or murmurs. Lungs: Diminished both bases with mild crackles Abdomen: soft, nontender, nondistended. No hepatosplenomegaly. No bruits or masses. Good bowel sounds. Extremities: no cyanosis, clubbing, rash. RUE PICC dual lumen . R Thigh lateral aspect dressing intact x2. 1+ edema at ankles bilaterally. Neuro: sleepy but awakens  Telemetry: NSR   Labs: Basic Metabolic Panel:  Recent Labs Lab 09/08/14 0430 09/09/14 0400 09/10/14 0525 09/11/14 0452 09/12/14 0630 09/13/14 0530  NA 133* 128* 134* 131* 125* 131*  K 4.4 4.7 4.4 4.3 4.8 4.0  CL 99* 98* 97* 96* 90* 93*  CO2 27 24 28 27 24 27   GLUCOSE 126* 135* 99 126* 129* 85  BUN 7 20 18  31* 45* 24*  CREATININE 1.71* 3.18* 2.85* 3.94* 4.87* 3.47*  CALCIUM 8.5* 8.9 8.3* 8.6* 8.6* 8.2*  MG 2.3 2.3 2.2 2.3 2.2  --   PHOS 2.2* 3.3 4.1 5.2* 6.0*  --     Liver Function Tests:  Recent Labs Lab 09/07/14 0445  09/08/14 0430 09/09/14 0400 09/10/14 0525 09/11/14 0452 09/12/14 0630  AST 31  --   --   --   --   --   --   ALT 12*  --   --   --   --   --   --   Arabella Merles  79  --   --   --   --   --   --   BILITOT 0.8  --   --   --   --   --   --   PROT 6.7  --   --   --   --   --   --   ALBUMIN 2.8*  < > 2.7* 2.7* 2.4* 2.4* 2.5*  < > = values in this interval not displayed. No results for input(s): LIPASE, AMYLASE in the last 168 hours. No results for input(s): AMMONIA in the last 168 hours.  CBC:  Recent Labs Lab 09/09/14 0400 09/10/14 0525 09/11/14 0452 09/12/14 0630 09/13/14 0530  WBC 11.8* 11.4* 9.2 8.4 8.0  HGB 9.3* 8.2* 8.1* 8.4* 7.6*  HCT 28.8* 26.3* 26.1* 27.0* 24.0*  MCV 97.6 97.0 97.4 96.8 96.0  PLT 216 187 202 229 201    Cardiac Enzymes: No results for input(s): CKTOTAL, CKMB, CKMBINDEX, TROPONINI in the last 168 hours.  BNP: BNP (last 3 results)  Recent Labs  05/22/14 1029 05/27/14 1126 06/19/14 1030  BNP 935.2* 1627.8* 738.9*    ProBNP (last 3 results)  Recent  Labs  10/09/13 0926 11/14/13 0926 01/14/14 1112  PROBNP 1561.0* 1261.0* 5769.0*      Other results:  Imaging: Dg Abd 1 View  09/12/2014   CLINICAL DATA:  Constipation, abdominal tenderness, history of diverticulitis and diabetes.  EXAM: ABDOMEN - 1 VIEW  COMPARISON:  Abdominal film of August 30, 2014  FINDINGS: There is mild gaseous distention of the stomach and of the colon. There is a moderate stool burden in the rectosigmoid. There is no small bowel obstructive pattern. There are no free extraluminal gas collections. There are degenerative changes of the lumbar spine. There is a rounded calcific density in the pelvis that likely reflects an osteophyte at the L5-S1 level.  IMPRESSION: Moderate gaseous distention of the stomach and colon. The pattern does not appear obstructive however. When compared to the study of July 22nd the volume of colonic gas has decreased somewhat. There is no evidence of perforation.   Electronically Signed   By: Joakim  Martinique M.D.   On: 09/12/2014 09:18     Medications:     Scheduled Medications: . sodium chloride   Intravenous Once  . sodium chloride  10 mL/hr Intravenous Once  . amiodarone  200 mg Oral Daily  . atorvastatin  40 mg Oral q1800  . cefTRIAXone (ROCEPHIN)  IV  1 g Intravenous Q24H  . chlorhexidine  15 mL Mouth/Throat BID  . darbepoetin (ARANESP) injection - DIALYSIS  100 mcg Intravenous Q Mon-HD  . DULoxetine  30 mg Oral Daily  . feeding supplement (NEPRO CARB STEADY)  237 mL Oral BID BM  . fentaNYL      . ferric gluconate (FERRLECIT/NULECIT) IV  125 mg Intravenous Daily  . heparin      . insulin aspart  0-9 Units Subcutaneous TID AC & HS  . levothyroxine  75 mcg Oral QAC breakfast  . lidocaine      . midazolam      . polyethylene glycol  17 g Oral BID  . sildenafil  80 mg Oral TID  . sodium chloride irrigation  200 mL Irrigation Q2H while awake    Infusions: . sodium chloride 10 mL/hr at 08/29/14 0543  . DOBUTamine 2.5 mcg/kg/min  (09/12/14 0323)  . heparin Stopped (09/13/14 0615)  . sodium chloride irrigation      PRN Medications:  albuterol, ALPRAZolam, morphine injection, ondansetron (ZOFRAN) IV, oxyCODONE-acetaminophen **AND** oxyCODONE, sodium chloride, temazepam   Assessment:  1. Left Hip Fracture s/p IM nail on 08/26/14 2. Chronic systolic CHF- Nonischemic cardiomyopathy, on home milrinone 0.5 mcg/kg/min, decreased to 0.25 here with NSVT. Became hypotensive, now on dobutamine 2.5.  3. Acute on chronic renal failure - suspect post-op ATN.  CVVH => HD.  4.  Paroxysmal atrial fibrillation requiring DCCV 02/2014, 04/2014- on chronic amio and xarelto. CHADS VASc Score- 5.  Remains in NSR.  5. Diabetes mellitus type 2 6. Depression/anxiety 7. OSA, complex- Needs Bipap at night but had difficulty after dental procedure.  8 Pulmonary HTN - Mixed pulmonary venous hypertension and PAH- on Revatio 80 mg tid 9. Dental extractions 08/2014 by Dr Enrique Sack 10. Obesity- Body mass index is 32.39 kg/(m^2). 11. Probable chronic respiratory failure - requiring home O2 with ambulation as of this admission 12. Hyponatremia   13. NSVT 14. Delirium- Improved.   Plan/Discussion:    Suspect post-op ATN, oliguric.  Now getting HD (off CVVH).  He has tolerated this well so far.  It looks like he is going to need long-term HD.  I have discussed this with Dr Moshe Cipro.  PD may be a long-term option.  For now will get HD at hospital, may be going to inpatient rehab and can continue HD there.  Cannot get HD at outpatient center b/c of dobutamine.  Switched over now to dobutamine, think this will be safer than milrinone with intermittent HD.  Co-ox has been ok.    Revatio for pulmonary hypertension.   NSR, can decrease amiodarone to daily.   Continue PT/OT. Screening for inpatient rehab.   Length of Stay: [place] Edric Fetterman,MD 7:52 AM Advanced Heart Failure Team Pager [phone] (M-F; 7a - 4p)  Please contact [facility] for  night-coverage after hours (4p -7a ) and weekends on [email or web address]

## 2014-09-13 NOTE — Progress Notes (Signed)
Rehab admissions - I met with pt and his wife in follow up to rehab MD consult to explain the possibility of inpatient rehab. Further details were given about our rehab program and informational brochures as well. They are interested in pursuing inpatient rehab however pt's wife had several concerns about pt's cardiac issues.  At this time, we will need a clear plan determined for pt's hemodialysis needs. Per renal note, pt is planned for HD tomorrow and there are several factors in this matter. We can consider pt once he is either determined ESRD/clipped or perhaps pt will not require HD.  In addition, pt will need to be off all drips (currently on Dobutamine drip).  Pt also has [employer] and we would need insurance authorization to consider a possible inpatient rehab admit. Another possible issue is that [facility] does not typically give insurance authorization based on pt's current diagnosis of hip fracture, though pt's case is medically complex.  This was all discussed with pt/wife and further questions were answered. Wife shared that she works full time from 10 am - 6 pm and would need assistance getting pt transportation to outpatient HD if needed. She is checking with [employer] to see what possible benefits are available for this.  I will check on pt's status on Monday. Thanks.  Nanetta Batty, PT Rehabilitation Admissions Coordinator [phone]

## 2014-09-13 NOTE — Progress Notes (Signed)
ANTICOAGULATION CONSULT NOTE - Follow Up Consult  Pharmacy Consult for Heparin Indication: atrial fibrillation  Allergies  Allergen Reactions  . Bydureon [Exenatide] Other (See Comments)    sweating  . Losartan Potassium Other (See Comments)    insomnia    Patient Measurements: Height: 5\' 10"  (177.8 cm) Weight: 227 lb 1.2 oz (103 kg) IBW/kg (Calculated) : 73 Heparin Dosing Weight: 94kg  Vital Signs: Temp: 97.9 F (36.6 C) (08/05 0714) Temp Source: Oral (08/05 0714) BP: 112/48 mmHg (08/05 0928) Pulse Rate: 71 (08/05 0928)  Labs:  Recent Labs  09/11/14 0452 09/11/14 0615  09/12/14 0630 09/12/14 1132 09/13/14 0530  HGB 8.1*  --   --  8.4*  --  7.6*  HCT 26.1*  --   --  27.0*  --  24.0*  PLT 202  --   --  229  --  201  LABPROT  --  17.9*  --   --   --   --   INR  --  1.47  --   --   --   --   HEPARINUNFRC  --  0.50  < > 1.88* 0.54 0.36  CREATININE 3.94*  --   --  4.87*  --  3.47*  < > = values in this interval not displayed.  Estimated Creatinine Clearance: 26.5 mL/min (by C-G formula based on Cr of 3.47).   Medications:  Heparin @ 1800 units/hr  Assessment: 64yom continues on heparin for afib. Heparin level is therapeutic but on the low end and trending down - will increase rate slightly. He is s/p permanent HD catheter placement this morning.  Hemoglobin down to 7.6 - to receive 1 unit PRBCs. No active bleeding noted.  Goal of Therapy:  Heparin level 0.3-0.7 units/ml Monitor platelets by anticoagulation protocol: Yes   Plan:  1) Increase heparin to 1850 units/hr 2) Heparin level, INR, CBC in AM 3) Follow up resuming coumadin  Deboraha Sprang 09/13/2014,10:44 AM

## 2014-09-13 NOTE — Progress Notes (Signed)
Had runs of v-tach, asymptomatic, continue to monitor.

## 2014-09-13 NOTE — Consult Note (Addendum)
WOC wound consult note Reason for Consult: Consult requested for buttocks/sacrum.   Wound type: Buttocks with red partial thickness abrasion; 1X.5X.1cm, red and moist, no odor or drainage.  This is NOT a pressure ulcer, it is not located over a bony prominence and has irregular edges, appearance consistent with probable shear.  Area between buttocks darker in color, appearance consistent with deep tissue injury, 3X1cm with intact skin, no odor or drainage.  Pt was in the OR on 7/18.  He is turned off the affected area and foam dressing has been applied to protect this site.  Deep tissue injuries are high risk to evolve into full thickness tissue loss despite optimal plan of care.  Discussed preventive measures and topical treatment with patient, he denies further questions. Please re-consult if further assistance is needed.  Thank-you,  Julien Girt MSN, [place], [place], [place], [place]

## 2014-09-13 NOTE — Progress Notes (Signed)
PT Cancellation Note  Patient Details Name: Larry [name] MRN: [id number] DOB: [birth year]   Cancelled Treatment:    Reason Eval/Treat Not Completed: Medical issues which prohibited therapy (pt had procedure in am and getting blood pm)   Denice Paradise 09/13/2014, 4:26 PM  Xia Stohr,PT Acute Rehabilitation [phone] (pager)

## 2014-09-14 LAB — BASIC METABOLIC PANEL WITH GFR
Anion gap: 13 (ref 5–15)
BUN: 37 mg/dL — ABNORMAL HIGH (ref 6–20)
CO2: 24 mmol/L (ref 22–32)
Calcium: 8.3 mg/dL — ABNORMAL LOW (ref 8.9–10.3)
Chloride: 90 mmol/L — ABNORMAL LOW (ref 101–111)
Creatinine, Ser: 4 mg/dL — ABNORMAL HIGH (ref 0.61–1.24)
GFR calc Af Amer: 17 mL/min — ABNORMAL LOW
GFR calc non Af Amer: 15 mL/min — ABNORMAL LOW
Glucose, Bld: 133 mg/dL — ABNORMAL HIGH (ref 65–99)
Potassium: 3.9 mmol/L (ref 3.5–5.1)
Sodium: 127 mmol/L — ABNORMAL LOW (ref 135–145)

## 2014-09-14 LAB — PROTIME-INR
INR: 1.78 — ABNORMAL HIGH (ref 0.00–1.49)
Prothrombin Time: 20.6 s — ABNORMAL HIGH (ref 11.6–15.2)

## 2014-09-14 LAB — GLUCOSE, CAPILLARY
GLUCOSE-CAPILLARY: 108 mg/dL — AB (ref 65–99)
Glucose-Capillary: 120 mg/dL — ABNORMAL HIGH (ref 65–99)
Glucose-Capillary: 116 mg/dL — ABNORMAL HIGH (ref 65–99)

## 2014-09-14 LAB — CBC
HEMATOCRIT: 27.6 % — AB (ref 39.0–52.0)
HEMOGLOBIN: 8.8 g/dL — AB (ref 13.0–17.0)
MCH: 30.2 pg (ref 26.0–34.0)
MCHC: 31.9 g/dL (ref 30.0–36.0)
MCV: 94.8 fL (ref 78.0–100.0)
Platelets: 215 10*3/uL (ref 150–400)
RBC: 2.91 MIL/uL — ABNORMAL LOW (ref 4.22–5.81)
RDW: 17.4 % — AB (ref 11.5–15.5)
WBC: 9.1 10*3/uL (ref 4.0–10.5)

## 2014-09-14 LAB — HEPARIN LEVEL (UNFRACTIONATED): Heparin Unfractionated: 0.33 [IU]/mL (ref 0.30–0.70)

## 2014-09-14 MED ORDER — CETYLPYRIDINIUM CHLORIDE 0.05 % MT LIQD
7.0000 mL | Freq: Two times a day (BID) | OROMUCOSAL | Status: DC
  Administered 2014-09-14 – 2014-09-16 (×3): 7 mL via OROMUCOSAL

## 2014-09-14 MED ORDER — AMIODARONE HCL 200 MG PO TABS: 200.0000 mg | ORAL_TABLET | Freq: Once | ORAL | Status: DC

## 2014-09-14 MED ORDER — ONDANSETRON HCL 4 MG/2ML IJ SOLN
INTRAMUSCULAR | Status: AC
  Administered 2014-09-14: 4 mg via INTRAVENOUS
  Filled 2014-09-14: qty 2

## 2014-09-14 MED ORDER — BISACODYL 5 MG PO TBEC
5.0000 mg | DELAYED_RELEASE_TABLET | Freq: Every day | ORAL | Status: DC
  Administered 2014-09-14 – 2014-09-26 (×12): 5 mg via ORAL
  Filled 2014-09-14 (×12): qty 1

## 2014-09-14 MED ORDER — OXYCODONE-ACETAMINOPHEN 5-325 MG PO TABS
ORAL_TABLET | ORAL | Status: AC
  Filled 2014-09-14: qty 1

## 2014-09-14 MED ORDER — LEVOTHYROXINE SODIUM 75 MCG PO TABS
75.0000 ug | ORAL_TABLET | Freq: Every day | ORAL | Status: DC
  Administered 2014-09-14 – 2014-10-11 (×27): 75 ug via ORAL
  Filled 2014-09-14 (×32): qty 1

## 2014-09-14 MED ORDER — OXYCODONE HCL 5 MG PO TABS
ORAL_TABLET | ORAL | Status: AC
  Filled 2014-09-14: qty 1

## 2014-09-14 MED ORDER — AMIODARONE HCL 200 MG PO TABS
200.0000 mg | ORAL_TABLET | Freq: Two times a day (BID) | ORAL | Status: DC
  Administered 2014-09-14 – 2014-09-22 (×16): 200 mg via ORAL
  Filled 2014-09-14 (×22): qty 1

## 2014-09-14 MED ORDER — AMIODARONE HCL 200 MG PO TABS: 400.0000 mg | ORAL_TABLET | Freq: Two times a day (BID) | ORAL | Status: DC

## 2014-09-14 NOTE — Progress Notes (Signed)
[facility] PROGRESS NOTE  CLAXTON LEVITZ UDT:[id number] DOB: [birth year] DOA: 08/25/2014 PCP: [name], MD   Interim summary 64 y/o man with history of chronic systolic CHF (nonischemic cardiomyopathy) s/p 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 He is on chronic milrinone 0.5 mcg. Admitted with fall---> broken L hip . 7/18 had IM Nail L femur. Course complicated with fluid overload and acute on chronic renal failure (due to ATN most liekely). Required CVVHD 7/23>> 7/31. Now on intermittent HD and being assessed for ESRD and HD dependency .  Assessment/Plan: 1. Severe nonischemic cardiomyopathy with chronic systolic heart failure with an EF of 25%. History of V. tach. Patient has AICD, on home milrinone drip. Experienced hypotension requiring norepinephrine and now on dobutamine; plan is to continue dobutamine instead of milrinone. -Patient was started on CVVHD on 08/31/2014 until 09/08/2014 -Cardiology managing HF meds -He tolerated CVVHD; but has remained oliguric and was initiated on IHD (8/1), 8-4 -2L removed during HD on 8/1 -weight 100 Kg ---102---103--104. -Had HD 8-4.  -Continue with dobutamine. Plan for HD 8-6  2.  Acute on chronic renal failure -Xarelto was discontinued and started on IV heparin -Nephrology was consulted, feeling renal failure likely represents postoperative ATN/hypoperfusion from hypotension -CVVH was started on 08/31/2014 and stopped on 7/31 -still oliguric, LE edema.  -Nephrology managing  -plan is to observed and continue HD intermittently as needed last treatment on 8/1 , 8-4, plan for HD 8-6 -most likely will ended on permanent HD -if renal function recovers, patient might need Flomax prior to discontinuation of foley catheter.   3. Constipation -Patient w/o diarrhea; reports BM are ok now. -last BM 8/1 -KUB negative for obstruction.  Had 2 BM 8-05,  resume home dose dulcolax.   Abdominal pain; cramping;  Could be related to fluid retention, MS from vomiting. [name] He had BM 8-05.  He wants to weight in getting CT scan.   4.  Hyponatremia -I suspect secondary to volume overload and renal failure -HD today.  -follow trend.   5. Mechanical trip and fall with left femoral intertrochanteric fracture.  -status post intramedullary nail to left hip on 7/18 -WBAT -PT/OT evaluation  6. Paroxysmal atrial fibrillation and pulmonary HTN.  -Xarelto discontinued due to worsening kidney function, now on IV heparin and with plans for coumadin eventually -On Amio -continue revatio for pulm HTN -hold coumadin in case he required permanent access for dialysis.   7. Obstructive sleep apnea.  -Patient refusing BiPAP;   8. DM type II. recent A1c of 7.7 -lantus changed to 5 units along with SSI since 7/28, he had hypoglycemia on 7/27 -follow CBG's and adjust as needed -no further hypoglycemic events; eating ok  9. Insomnia: -will continue using restoril PRN   10-UTI; UA with too numerous to count WBC. Started on ceftriaxone 8-03. urine culture growing serratia/ sensitive to ceftriaxone./   Day 4/7.   11-Stage 2 sacrum: Wound care consulted and recommendations appreciated.   12-Anemia; received one unit PRBC 8-05. Repeated CBC pending. Further transfusion during hemodialysis as indicated.   Code Status: Full Family Communication: wife at bedside Disposition Plan: Patient in the process of been assessed for ESRD. On Dobutamine drip for heart failure.    Consultants:  Ortho (Dr. Percell Miller)  HF team   Nephrology  Procedures:  S/P intramedullary nail on left hip on 7/18  CVVH   Antibiotics:  ceftriaxone  HPI/Subjective: He was able  to have 2 BM yesterday. He vomited once last night. He is having abdominal pain after vomiting.  He is breathing ok. He does not want to have CT scan right now.     Objective: Filed Vitals:    09/14/14 0824  BP: 103/52  Pulse: 71  Temp: 98.2 F (36.8 C)  Resp: 11    Intake/Output Summary (Last 24 hours) at 09/14/14 0836 Last data filed at 09/14/14 0700  Gross per 24 hour  Intake 1320.9 ml  Output    200 ml  Net 1120.9 ml   Filed Weights   09/12/14 1540 09/13/14 0401 09/14/14 0300  Weight: 104.7 kg (230 lb 13.2 oz) 103 kg (227 lb 1.2 oz) 104.7 kg (230 lb 13.2 oz)    Exam:   General:  He is  alert, oriented X 3, follows commands, chronically ill-appearing;   Cardiovascular:no rubs, no gallops, RRR  Respiratory: Normal respiratory effort, lungs are clear to auscultation bilaterally  Abdomen: soft, , positive Bowel sounds, distended, mild tenderness  Musculoskeletal: Surgical incision site appears clean, no evidence of infection. Plus edema   Data Reviewed: Basic Metabolic Panel:  Recent Labs Lab 09/08/14 0430 09/09/14 0400 09/10/14 0525 09/11/14 0452 09/12/14 0630 09/13/14 0530 09/14/14 0325  NA 133* 128* 134* 131* 125* 131* 127*  K 4.4 4.7 4.4 4.3 4.8 4.0 3.9  CL 99* 98* 97* 96* 90* 93* 90*  CO2 $Re'27 24 28 27 24 27 24  'xbz$ GLUCOSE 126* 135* 99 126* 129* 85 133*  BUN $Re'7 20 18 'FjS$ 31* 45* 24* 37*  CREATININE 1.71* 3.18* 2.85* 3.94* 4.87* 3.47* 4.00*  CALCIUM 8.5* 8.9 8.3* 8.6* 8.6* 8.2* 8.3*  MG 2.3 2.3 2.2 2.3 2.2  --   --   PHOS 2.2* 3.3 4.1 5.2* 6.0*  --   --    CBC:  Recent Labs Lab 09/09/14 0400 09/10/14 0525 09/11/14 0452 09/12/14 0630 09/13/14 0530  WBC 11.8* 11.4* 9.2 8.4 8.0  HGB 9.3* 8.2* 8.1* 8.4* 7.6*  HCT 28.8* 26.3* 26.1* 27.0* 24.0*  MCV 97.6 97.0 97.4 96.8 96.0  PLT 216 187 202 229 201   BNP (last 3 results)  Recent Labs  05/22/14 1029 05/27/14 1126 06/19/14 1030  BNP 935.2* 1627.8* 738.9*    ProBNP (last 3 results)  Recent Labs  10/09/13 0926 11/14/13 0926 01/14/14 1112  PROBNP 1561.0* 1261.0* 5769.0*   CBG:  Recent Labs Lab 09/13/14 0713 09/13/14 1252 09/13/14 1655 09/13/14 2102 09/14/14 0822  GLUCAP 85  99 104* 115* 120*    Recent Results (from the past 240 hour(s))  Urine culture     Status: None   Collection Time: 09/10/14  9:30 AM  Result Value Ref Range Status   Specimen Description URINE, RANDOM  Final   Special Requests NONE  Final   Culture >=100,000 COLONIES/mL SERRATIA MARCESCENS  Final   Report Status 09/12/2014 FINAL  Final   Organism ID, Bacteria SERRATIA MARCESCENS  Final      Susceptibility   Serratia marcescens - MIC*    CEFAZOLIN >=64 RESISTANT Resistant     CEFTRIAXONE <=1 SENSITIVE Sensitive     CIPROFLOXACIN <=0.25 SENSITIVE Sensitive     GENTAMICIN <=1 SENSITIVE Sensitive     NITROFURANTOIN 128 RESISTANT Resistant     TRIMETH/SULFA <=20 SENSITIVE Sensitive     * >=100,000 COLONIES/mL SERRATIA MARCESCENS     Studies: Dg Abd 1 View  09/12/2014   CLINICAL DATA:  Constipation, abdominal tenderness, history of diverticulitis and diabetes.  EXAM: ABDOMEN -  1 VIEW  COMPARISON:  Abdominal film of August 30, 2014  FINDINGS: There is mild gaseous distention of the stomach and of the colon. There is a moderate stool burden in the rectosigmoid. There is no small bowel obstructive pattern. There are no free extraluminal gas collections. There are degenerative changes of the lumbar spine. There is a rounded calcific density in the pelvis that likely reflects an osteophyte at the L5-S1 level.  IMPRESSION: Moderate gaseous distention of the stomach and colon. The pattern does not appear obstructive however. When compared to the study of July 22nd the volume of colonic gas has decreased somewhat. There is no evidence of perforation.   Electronically Signed   By: Faysal  Martinique M.D.   On: 09/12/2014 09:18   Ir Fluoro Guide Cv Line Right  09/13/2014   INDICATION: 64 year old male currently undergoing dialysis via a right neck temporary catheter. Conversion to a tunneled hemodialysis catheter is warranted.  EXAM: TUNNELED CENTRAL VENOUS HEMODIALYSIS CATHETER PLACEMENT WITH ULTRASOUND AND  FLUOROSCOPIC GUIDANCE  MEDICATIONS: Ancef 2 gm IV; The IV antibiotic was given in an appropriate time interval prior to skin puncture.  CONTRAST:  None  ANESTHESIA/SEDATION: Versed 1.5 mg IV; Fentanyl 50 mcg IV  Total Moderate Sedation Time  22 minutes.  FLUOROSCOPY TIME:  1 minutes 6 seconds  03.5 mGy  COMPLICATIONS: None immediate  PROCEDURE: Informed written consent was obtained from the patient after a discussion of the risks, benefits, and alternatives to treatment. Questions regarding the procedure were encouraged and answered.  The existing right IJ non tunneled hemodialysis catheter was removed. Hemostasis was attained by gentle manual pressure.  The right neck and chest were prepped with chlorhexidine in a sterile fashion, and a sterile drape was applied covering the operative field. Maximum barrier sterile technique with sterile gowns and gloves were used for the procedure. A timeout was performed prior to the initiation of the procedure.  After creating a small venotomy incision, a micropuncture kit was utilized to access the right internal jugular vein under direct, real-time ultrasound guidance after the overlying soft tissues were anesthetized with 1% lidocaine with epinephrine. Ultrasound image documentation was performed. The microwire was kinked to measure appropriate catheter length. A stiff glidewire was advanced to the level of the IVC and the micropuncture sheath was exchanged for a peel-away sheath. A Palindrome tunneled hemodialysis catheter measuring 23 cm from tip to cuff was tunneled in a retrograde fashion from the anterior chest wall to the venotomy incision.  The catheter was then placed through the peel-away sheath with tips ultimately positioned within the mid aspect of the right atrium. Final catheter positioning was confirmed and documented with a spot radiographic image. The catheter aspirates and flushes normally. The catheter was flushed with appropriate volume heparin dwells.   The catheter exit site was secured with a 0-Prolene retention suture. The venotomy incision was closed with an interrupted 4-0 Vicryl, and Dermabond. Dressings were applied. The patient tolerated the procedure well without immediate post procedural complication.  IMPRESSION: Successful placement of 23 cm tip to cuff tunneled hemodialysis catheter via the right internal jugular vein with tips terminating within the superior aspect of the right atrium. The catheter is ready for immediate use.  Signed,  Criselda Peaches, MD  [facility]  [facility]   Electronically Signed   By: Jacqulynn Cadet M.D.   On: 09/13/2014 11:32   Ir US Guide Vasc Access Right  09/13/2014   INDICATION: 64 year old male currently undergoing  dialysis via a right neck temporary catheter. Conversion to a tunneled hemodialysis catheter is warranted.  EXAM: TUNNELED CENTRAL VENOUS HEMODIALYSIS CATHETER PLACEMENT WITH ULTRASOUND AND FLUOROSCOPIC GUIDANCE  MEDICATIONS: Ancef 2 gm IV; The IV antibiotic was given in an appropriate time interval prior to skin puncture.  CONTRAST:  None  ANESTHESIA/SEDATION: Versed 1.5 mg IV; Fentanyl 50 mcg IV  Total Moderate Sedation Time  22 minutes.  FLUOROSCOPY TIME:  1 minutes 6 seconds  55.7 mGy  COMPLICATIONS: None immediate  PROCEDURE: Informed written consent was obtained from the patient after a discussion of the risks, benefits, and alternatives to treatment. Questions regarding the procedure were encouraged and answered.  The existing right IJ non tunneled hemodialysis catheter was removed. Hemostasis was attained by gentle manual pressure.  The right neck and chest were prepped with chlorhexidine in a sterile fashion, and a sterile drape was applied covering the operative field. Maximum barrier sterile technique with sterile gowns and gloves were used for the procedure. A timeout was performed prior to the initiation of the procedure.  After creating a  small venotomy incision, a micropuncture kit was utilized to access the right internal jugular vein under direct, real-time ultrasound guidance after the overlying soft tissues were anesthetized with 1% lidocaine with epinephrine. Ultrasound image documentation was performed. The microwire was kinked to measure appropriate catheter length. A stiff glidewire was advanced to the level of the IVC and the micropuncture sheath was exchanged for a peel-away sheath. A Palindrome tunneled hemodialysis catheter measuring 23 cm from tip to cuff was tunneled in a retrograde fashion from the anterior chest wall to the venotomy incision.  The catheter was then placed through the peel-away sheath with tips ultimately positioned within the mid aspect of the right atrium. Final catheter positioning was confirmed and documented with a spot radiographic image. The catheter aspirates and flushes normally. The catheter was flushed with appropriate volume heparin dwells.  The catheter exit site was secured with a 0-Prolene retention suture. The venotomy incision was closed with an interrupted 4-0 Vicryl, and Dermabond. Dressings were applied. The patient tolerated the procedure well without immediate post procedural complication.  IMPRESSION: Successful placement of 23 cm tip to cuff tunneled hemodialysis catheter via the right internal jugular vein with tips terminating within the superior aspect of the right atrium. The catheter is ready for immediate use.  Signed,  Criselda Peaches, MD  [facility]  [facility]   Electronically Signed   By: Jacqulynn Cadet M.D.   On: 09/13/2014 11:32    Scheduled Meds: . sodium chloride   Intravenous Once  . sodium chloride  10 mL/hr Intravenous Once  . sodium chloride   Intravenous Once  . amiodarone  200 mg Oral Daily  . atorvastatin  40 mg Oral q1800  . cefTRIAXone (ROCEPHIN)  IV  1 g Intravenous Q24H  . chlorhexidine  15 mL  Mouth/Throat BID  . darbepoetin (ARANESP) injection - DIALYSIS  100 mcg Intravenous Q Mon-HD  . DULoxetine  30 mg Oral Daily  . feeding supplement (NEPRO CARB STEADY)  237 mL Oral BID BM  . insulin aspart  0-9 Units Subcutaneous TID AC & HS  . levothyroxine  75 mcg Oral QAC breakfast  . polyethylene glycol  17 g Oral BID  . sildenafil  80 mg Oral TID  . sodium chloride irrigation  200 mL Irrigation Q2H while awake   Continuous Infusions: . sodium chloride 10 mL/hr at 08/29/14 0543  . DOBUTamine  2.5 mcg/kg/min (09/12/14 0323)  . heparin 1,850 Units/hr (09/14/14 0555)  . sodium chloride irrigation      Principal Problem:   Fracture, intertrochanteric, left femur Active Problems:   Essential hypertension, benign   Persistent atrial fibrillation   Automatic implantable cardioverter-defibrillator in situ   DM2 (diabetes mellitus, type 2)   OSA (obstructive sleep apnea), intolerant to CPAP/BIPAP   Ventricular tachycardia   Chronic systolic CHF (congestive heart failure)   Hip fracture   Left femoral shaft fracture   Acute on chronic renal failure   Hyponatremia   Acute on chronic systolic CHF (congestive heart failure)   Acute renal failure superimposed on stage 4 chronic kidney disease   SOB (shortness of breath)   Insomnia   Oliguria   Adjustment disorder with anxious mood   Chronic atrial fibrillation   Pressure ulcer   Debility   Time spent: 30 minutes   Katlynne Mckercher, [place] [facility] Pager [phone]. If 7PM-7AM, please contact night-coverage at [email or web address], password [facility] 09/14/2014, 8:36 AM  LOS: 20 days

## 2014-09-14 NOTE — Progress Notes (Signed)
ANTICOAGULATION CONSULT NOTE - Follow Up Consult  Pharmacy Consult for heparin Indication: afib  Allergies  Allergen Reactions  . Bydureon [Exenatide] Other (See Comments)    sweating  . Losartan Potassium Other (See Comments)    insomnia    Patient Measurements: Height: 5\' 10"  (177.8 cm) Weight: 230 lb 13.2 oz (104.7 kg) IBW/kg (Calculated) : 73 Heparin Dosing Weight: 94 kg  Vital Signs: Temp: 98.2 F (36.8 C) (08/06 0824) Temp Source: Oral (08/06 0824) BP: 103/52 mmHg (08/06 0824) Pulse Rate: 71 (08/06 0824)  Labs:  Recent Labs  09/12/14 0630 09/12/14 1132 09/13/14 0530 09/14/14 0325  HGB 8.4*  --  7.6*  --   HCT 27.0*  --  24.0*  --   PLT 229  --  201  --   LABPROT  --   --   --  20.6*  INR  --   --   --  1.78*  HEPARINUNFRC 1.88* 0.54 0.36 0.33  CREATININE 4.87*  --  3.47* 4.00*    Estimated Creatinine Clearance: 23.2 mL/min (by C-G formula based on Cr of 4).   Assessment: 64 yo m continues on heparin for afib. HL is therapeutic at 0.33 this AM. No active bleeding or issues noted, hgb 7.6 yesterday 8/5, no recent CBC.  Goal of Therapy:  Heparin level 0.3-0.7 units/ml Monitor platelets by anticoagulation protocol: Yes   Plan:  Continue heparin infusion at 1850 units/hr Daily HL Monitor hgb/plts, s/s of bleeding, clinical course  Wah Sabic L. Nicole Kindred, PharmD Clinical Pharmacy Resident Pager: [phone] 09/14/2014 10:34 AM

## 2014-09-14 NOTE — Progress Notes (Signed)
09/14/2014 1:58 AM  Vomited moderate amount of undigested food and fluid. Zofran IV given. Jaman Aro, Carolynn Comment

## 2014-09-14 NOTE — Progress Notes (Signed)
Subjective:  Minimal UOP - vomited- s/p [name] unit of blood yest- no significant clinical change  Objective Vital signs in last 24 hours: Filed Vitals:   09/14/14 0316 09/14/14 0400 09/14/14 0500 09/14/14 0600  BP: 111/56 104/55  108/57  Pulse: 74 69 69 71  Temp: 98.3 F (36.8 C)     TempSrc: Oral     Resp: $Remo'12 18 22 14  'PwDyM$ Height:      Weight:      SpO2: 96% 96% 97% 97%   Weight change: 0 kg (0 lb)  Intake/Output Summary (Last 24 hours) at 09/14/14 0817 Last data filed at 09/14/14 0700  Gross per 24 hour  Intake 1320.9 ml  Output    200 ml  Net 1120.9 ml    Assessment/ Plan: Pt is [name] 64 y.o. yo male with baseline nonischemic cardiomyopathy on milrinone with stage 3 CKD who was admitted on 08/25/2014 with s/p hip fx- had [name] on CRF - now HD requiring  Assessment/Plan: 1. Renal- CKD at baseline- with events became dialysis requiring- on CRRT from 7/23- 7/31. Had IHD 8/1 and on 8/4. Unfortunately- his kidneys are not improving off of HD- will plan another IHD treatment today. Vascath converted  to [facility] on 8/5.  As far as long term plans- doing HD on someone on an inotrope successfully  has not been done  as far as I know.  We need to hope that he will either recover enough renal function to stay off of HD (crt in June was 2.4)  Or need to wean the dobutamine in order to do hemodialysis as an OP- cards does not think this would work- also had arrythmia with HD yest so unclear if will be stable enough to do HD  PD could also maybe be an option but have had logistic issues related to placing pt on PD as inpatient in past. We will need to tune him up as much as possible before even asking surgery about placing [name] PD cath.  This is [name] very difficult situation. Wife is appropriately upset to hear this news 2. Anemia- hgb falling with events and heparin- repleting iron and have added aranesp- s/p one unit yest-  3. HTN/volume-   Is very  overloaded-   Can not tell how much fluid was removed with HD 8/4    Baseline cardiomyopathy is complicating situation but surprisingly blood pressure is reasonable.  Lasix is not being effective has been stopped- continue with UF as able with HD 4. S/p hip fx- operative repair on 7/19- planning on SNF eventually- cannot be discharged until dialysis dispo is decided  5. Urine looks cloudy- - looked like UTI- rocephin given - will d/c foley   Larry [name]    Labs: Basic Metabolic Panel:  Recent Labs Lab 09/10/14 0525 09/11/14 0452 09/12/14 0630 09/13/14 0530 09/14/14 0325  NA 134* 131* 125* 131* 127*  K 4.4 4.3 4.8 4.0 3.9  CL 97* 96* 90* 93* 90*  CO2 $Re'28 27 24 27 24  'Byy$ GLUCOSE 99 126* 129* 85 133*  BUN 18 31* 45* 24* 37*  CREATININE 2.85* 3.94* 4.87* 3.47* 4.00*  CALCIUM 8.3* 8.6* 8.6* 8.2* 8.3*  PHOS 4.1 5.2* 6.0*  --   --    Liver Function Tests:  Recent Labs Lab 09/10/14 0525 09/11/14 0452 09/12/14 0630  ALBUMIN 2.4* 2.4* 2.5*   No results for input(s): LIPASE, AMYLASE in the last 168 hours. No results for input(s): AMMONIA in the last 168 hours. CBC:  Recent Labs Lab 09/09/14 0400 09/10/14 0525 09/11/14 0452 09/12/14 0630 09/13/14 0530  WBC 11.8* 11.4* 9.2 8.4 8.0  HGB 9.3* 8.2* 8.1* 8.4* 7.6*  HCT 28.8* 26.3* 26.1* 27.0* 24.0*  MCV 97.6 97.0 97.4 96.8 96.0  PLT 216 187 202 229 201   Cardiac Enzymes: No results for input(s): CKTOTAL, CKMB, CKMBINDEX, TROPONINI in the last 168 hours. CBG:  Recent Labs Lab 09/12/14 2122 09/13/14 0713 09/13/14 1252 09/13/14 1655 09/13/14 2102  GLUCAP 90 85 99 104* 115*    Iron Studies:  No results for input(s): IRON, TIBC, TRANSFERRIN, FERRITIN in the last 72 hours. Studies/Results: Dg Abd 1 View  09/12/2014   CLINICAL DATA:  Constipation, abdominal tenderness, history of diverticulitis and diabetes.  EXAM: ABDOMEN - 1 VIEW  COMPARISON:  Abdominal film of August 30, 2014  FINDINGS: There is mild gaseous distention of the stomach and of the colon. There is [name] moderate stool  burden in the rectosigmoid. There is no small bowel obstructive pattern. There are no free extraluminal gas collections. There are degenerative changes of the lumbar spine. There is [name] rounded calcific density in the pelvis that likely reflects an osteophyte at the L5-S1 level.  IMPRESSION: Moderate gaseous distention of the stomach and colon. The pattern does not appear obstructive however. When compared to the study of July 22nd the volume of colonic gas has decreased somewhat. There is no evidence of perforation.   Electronically Signed   By: Bryce  Martinique M.D.   On: 09/12/2014 09:18   Ir Fluoro Guide Cv Line Right  09/13/2014   INDICATION: 64 year old male currently undergoing dialysis via [name] right neck temporary catheter. Conversion to [name] tunneled hemodialysis catheter is warranted.  EXAM: TUNNELED CENTRAL VENOUS HEMODIALYSIS CATHETER PLACEMENT WITH ULTRASOUND AND FLUOROSCOPIC GUIDANCE  MEDICATIONS: Ancef 2 gm IV; The IV antibiotic was given in an appropriate time interval prior to skin puncture.  CONTRAST:  None  ANESTHESIA/SEDATION: Versed 1.5 mg IV; Fentanyl 50 mcg IV  Total Moderate Sedation Time  22 minutes.  FLUOROSCOPY TIME:  1 minutes 6 seconds  35.4 mGy  COMPLICATIONS: None immediate  PROCEDURE: Informed written consent was obtained from the patient after [name] discussion of the risks, benefits, and alternatives to treatment. Questions regarding the procedure were encouraged and answered.  The existing right IJ non tunneled hemodialysis catheter was removed. Hemostasis was attained by gentle manual pressure.  The right neck and chest were prepped with chlorhexidine in [name] sterile fashion, and [name] sterile drape was applied covering the operative field. Maximum barrier sterile technique with sterile gowns and gloves were used for the procedure. [name] timeout was performed prior to the initiation of the procedure.  After creating [name] small venotomy incision, [name] micropuncture kit was utilized to access the right internal  jugular vein under direct, real-time ultrasound guidance after the overlying soft tissues were anesthetized with 1% lidocaine with epinephrine. Ultrasound image documentation was performed. The microwire was kinked to measure appropriate catheter length. [name] stiff glidewire was advanced to the level of the IVC and the micropuncture sheath was exchanged for [name] peel-away sheath. [name] Palindrome tunneled hemodialysis catheter measuring 23 cm from tip to cuff was tunneled in [name] retrograde fashion from the anterior chest wall to the venotomy incision.  The catheter was then placed through the peel-away sheath with tips ultimately positioned within the mid aspect of the right atrium. Final catheter positioning was confirmed and documented with [name] spot radiographic image. The catheter aspirates and flushes normally. The catheter was  flushed with appropriate volume heparin dwells.  The catheter exit site was secured with [name] 0-Prolene retention suture. The venotomy incision was closed with an interrupted 4-0 Vicryl, and Dermabond. Dressings were applied. The patient tolerated the procedure well without immediate post procedural complication.  IMPRESSION: Successful placement of 23 cm tip to cuff tunneled hemodialysis catheter via the right internal jugular vein with tips terminating within the superior aspect of the right atrium. The catheter is ready for immediate use.  Signed,  Criselda Peaches, MD  [facility]  [facility]   Electronically Signed   By: Jacqulynn Cadet M.D.   On: 09/13/2014 11:32   Ir US Guide Vasc Access Right  09/13/2014   INDICATION: 64 year old male currently undergoing dialysis via [name] right neck temporary catheter. Conversion to [name] tunneled hemodialysis catheter is warranted.  EXAM: TUNNELED CENTRAL VENOUS HEMODIALYSIS CATHETER PLACEMENT WITH ULTRASOUND AND FLUOROSCOPIC GUIDANCE  MEDICATIONS: Ancef 2 gm IV; The IV antibiotic was given in an appropriate time  interval prior to skin puncture.  CONTRAST:  None  ANESTHESIA/SEDATION: Versed 1.5 mg IV; Fentanyl 50 mcg IV  Total Moderate Sedation Time  22 minutes.  FLUOROSCOPY TIME:  1 minutes 6 seconds  63.8 mGy  COMPLICATIONS: None immediate  PROCEDURE: Informed written consent was obtained from the patient after [name] discussion of the risks, benefits, and alternatives to treatment. Questions regarding the procedure were encouraged and answered.  The existing right IJ non tunneled hemodialysis catheter was removed. Hemostasis was attained by gentle manual pressure.  The right neck and chest were prepped with chlorhexidine in [name] sterile fashion, and [name] sterile drape was applied covering the operative field. Maximum barrier sterile technique with sterile gowns and gloves were used for the procedure. [name] timeout was performed prior to the initiation of the procedure.  After creating [name] small venotomy incision, [name] micropuncture kit was utilized to access the right internal jugular vein under direct, real-time ultrasound guidance after the overlying soft tissues were anesthetized with 1% lidocaine with epinephrine. Ultrasound image documentation was performed. The microwire was kinked to measure appropriate catheter length. [name] stiff glidewire was advanced to the level of the IVC and the micropuncture sheath was exchanged for [name] peel-away sheath. [name] Palindrome tunneled hemodialysis catheter measuring 23 cm from tip to cuff was tunneled in [name] retrograde fashion from the anterior chest wall to the venotomy incision.  The catheter was then placed through the peel-away sheath with tips ultimately positioned within the mid aspect of the right atrium. Final catheter positioning was confirmed and documented with [name] spot radiographic image. The catheter aspirates and flushes normally. The catheter was flushed with appropriate volume heparin dwells.  The catheter exit site was secured with [name] 0-Prolene retention suture. The venotomy incision was closed  with an interrupted 4-0 Vicryl, and Dermabond. Dressings were applied. The patient tolerated the procedure well without immediate post procedural complication.  IMPRESSION: Successful placement of 23 cm tip to cuff tunneled hemodialysis catheter via the right internal jugular vein with tips terminating within the superior aspect of the right atrium. The catheter is ready for immediate use.  Signed,  Criselda Peaches, MD  [facility]  [facility]   Electronically Signed   By: Jacqulynn Cadet M.D.   On: 09/13/2014 11:32   Medications: Infusions: . sodium chloride 10 mL/hr at 08/29/14 0543  . DOBUTamine 2.5 mcg/kg/min (09/12/14 0323)  . heparin 1,850 Units/hr (09/14/14 0555)  . sodium chloride irrigation  Scheduled Medications: . sodium chloride   Intravenous Once  . sodium chloride  10 mL/hr Intravenous Once  . sodium chloride   Intravenous Once  . amiodarone  200 mg Oral Daily  . atorvastatin  40 mg Oral q1800  . cefTRIAXone (ROCEPHIN)  IV  1 g Intravenous Q24H  . chlorhexidine  15 mL Mouth/Throat BID  . darbepoetin (ARANESP) injection - DIALYSIS  100 mcg Intravenous Q Mon-HD  . DULoxetine  30 mg Oral Daily  . feeding supplement (NEPRO CARB STEADY)  237 mL Oral BID BM  . insulin aspart  0-9 Units Subcutaneous TID AC & HS  . levothyroxine  75 mcg Oral QAC breakfast  . polyethylene glycol  17 g Oral BID  . sildenafil  80 mg Oral TID  . sodium chloride irrigation  200 mL Irrigation Q2H while awake    have reviewed scheduled and prn medications.  Physical Exam: General: obese-  Heart: RRR Lungs: decreased BS at bases Abdomen: distended Extremities: pitting edema to dependent areas Dialysis Access: right IJ vascath placed 7/23 - converted to PC on 8/5   09/14/2014,8:17 AM  LOS: 20 days

## 2014-09-14 NOTE — Progress Notes (Addendum)
Patient ID: Larry [name], male   DOB: [birth year], 64 y.o.   MRN: [id number] Advanced Heart Failure Rounding Note   Subjective:    Larry [name] is a 64 y/o man with history of chronic systolic CHF (nonischemic cardiomyopathy) s/p 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 He is on chronic milrinone 0.5 mcg.  Prior to admit he was being considered for LVAD.   Dischaged 7/16 after dental procedures. Also had RHC with elevate pulmonary pressures. Revatio was increased to 80 mg tid during that stay.  Admitted with fall---> broken L  hip . 7/18 had IM Nail L femur.  7/23 started CVVHD, CVVH stopped 7/31.   Gets very anxious with HD and hard to tolerate. This am with severe right ab/flank pain. Hypotensive with BPs in 60-70s. Getting 1u RBC and 250cc NS. Heparin stopped   Objective:   Weight Range:  Vital Signs:   Temp:  [97.3 F (36.3 C)-98.3 F (36.8 C)] 98.2 F (36.8 C) (08/06 0824) Pulse Rate:  [69-76] 71 (08/06 0824) Resp:  [0-22] 11 (08/06 0824) BP: (93-117)/(35-61) 103/52 mmHg (08/06 0824) SpO2:  [94 %-99 %] 98 % (08/06 0824) Weight:  [230 lb 13.2 oz (104.7 kg)] 230 lb 13.2 oz (104.7 kg) (08/06 0300) Last BM Date: 09/13/14  Weight change: Filed Weights   09/12/14 1540 09/13/14 0401 09/14/14 0300  Weight: 230 lb 13.2 oz (104.7 kg) 227 lb 1.2 oz (103 kg) 230 lb 13.2 oz (104.7 kg)    Intake/Output:   Intake/Output Summary (Last 24 hours) at 09/14/14 0929 Last data filed at 09/14/14 0900  Gross per 24 hour  Intake 5256.7 ml  Output    200 ml  Net 5056.7 ml     Physical Exam: CVP 1 General:  Pale. lying in bed moaning HEENT: normal Neck: supple. JVP flat cm. Tunneled HD catheter.  Carotids 2+ bilat; no bruits. No lymphadenopathy or thryomegaly appreciated. Cor: PMI nondisplaced. Regular rate & rhythm. No rubs, gallops or murmurs. Lungs: clear Abdomen: soft, nondistended.Very large  hematoma over right abdominal wall. Tender to minimal palpation. No rebound Good bowel sounds. Extremities: no cyanosis, clubbing, rash.  No edema Neuro: Alert/oriented in pain  Telemetry: NSR,   Labs: Basic Metabolic Panel:  Recent Labs Lab 09/08/14 0430 09/09/14 0400 09/10/14 0525 09/11/14 0452 09/12/14 0630 09/13/14 0530 09/14/14 0325  NA 133* 128* 134* 131* 125* 131* 127*  K 4.4 4.7 4.4 4.3 4.8 4.0 3.9  CL 99* 98* 97* 96* 90* 93* 90*  CO2 $Re'27 24 28 27 24 27 24  'RRZ$ GLUCOSE 126* 135* 99 126* 129* 85 133*  BUN $Re'7 20 18 'foq$ 31* 45* 24* 37*  CREATININE 1.71* 3.18* 2.85* 3.94* 4.87* 3.47* 4.00*  CALCIUM 8.5* 8.9 8.3* 8.6* 8.6* 8.2* 8.3*  MG 2.3 2.3 2.2 2.3 2.2  --   --   PHOS 2.2* 3.3 4.1 5.2* 6.0*  --   --     Liver Function Tests:  Recent Labs Lab 09/08/14 0430 09/09/14 0400 09/10/14 0525 09/11/14 0452 09/12/14 0630  ALBUMIN 2.7* 2.7* 2.4* 2.4* 2.5*   No results for input(s): LIPASE, AMYLASE in the last 168 hours. No results for input(s): AMMONIA in the last 168 hours.  CBC:  Recent Labs Lab 09/09/14 0400 09/10/14 0525 09/11/14 0452 09/12/14 0630 09/13/14 0530  WBC 11.8* 11.4* 9.2 8.4 8.0  HGB 9.3* 8.2* 8.1* 8.4* 7.6*  HCT 28.8* 26.3* 26.1* 27.0* 24.0*  MCV  97.6 97.0 97.4 96.8 96.0  PLT 216 187 202 229 201    Cardiac Enzymes: No results for input(s): CKTOTAL, CKMB, CKMBINDEX, TROPONINI in the last 168 hours.  BNP: BNP (last 3 results)  Recent Labs  05/22/14 1029 05/27/14 1126 06/19/14 1030  BNP 935.2* 1627.8* 738.9*    ProBNP (last 3 results)  Recent Labs  10/09/13 0926 11/14/13 0926 01/14/14 1112  PROBNP 1561.0* 1261.0* 5769.0*      Other results:  Imaging: Ir Fluoro Guide Cv Line Right  09/13/2014   INDICATION: 64 year old male currently undergoing dialysis via a right neck temporary catheter. Conversion to a tunneled hemodialysis catheter is warranted.  EXAM: TUNNELED CENTRAL VENOUS HEMODIALYSIS CATHETER PLACEMENT WITH ULTRASOUND AND  FLUOROSCOPIC GUIDANCE  MEDICATIONS: Ancef 2 gm IV; The IV antibiotic was given in an appropriate time interval prior to skin puncture.  CONTRAST:  None  ANESTHESIA/SEDATION: Versed 1.5 mg IV; Fentanyl 50 mcg IV  Total Moderate Sedation Time  22 minutes.  FLUOROSCOPY TIME:  1 minutes 6 seconds  40.8 mGy  COMPLICATIONS: None immediate  PROCEDURE: Informed written consent was obtained from the patient after a discussion of the risks, benefits, and alternatives to treatment. Questions regarding the procedure were encouraged and answered.  The existing right IJ non tunneled hemodialysis catheter was removed. Hemostasis was attained by gentle manual pressure.  The right neck and chest were prepped with chlorhexidine in a sterile fashion, and a sterile drape was applied covering the operative field. Maximum barrier sterile technique with sterile gowns and gloves were used for the procedure. A timeout was performed prior to the initiation of the procedure.  After creating a small venotomy incision, a micropuncture kit was utilized to access the right internal jugular vein under direct, real-time ultrasound guidance after the overlying soft tissues were anesthetized with 1% lidocaine with epinephrine. Ultrasound image documentation was performed. The microwire was kinked to measure appropriate catheter length. A stiff glidewire was advanced to the level of the IVC and the micropuncture sheath was exchanged for a peel-away sheath. A Palindrome tunneled hemodialysis catheter measuring 23 cm from tip to cuff was tunneled in a retrograde fashion from the anterior chest wall to the venotomy incision.  The catheter was then placed through the peel-away sheath with tips ultimately positioned within the mid aspect of the right atrium. Final catheter positioning was confirmed and documented with a spot radiographic image. The catheter aspirates and flushes normally. The catheter was flushed with appropriate volume heparin dwells.   The catheter exit site was secured with a 0-Prolene retention suture. The venotomy incision was closed with an interrupted 4-0 Vicryl, and Dermabond. Dressings were applied. The patient tolerated the procedure well without immediate post procedural complication.  IMPRESSION: Successful placement of 23 cm tip to cuff tunneled hemodialysis catheter via the right internal jugular vein with tips terminating within the superior aspect of the right atrium. The catheter is ready for immediate use.  Signed,  Criselda Peaches, MD  [facility]  [facility]   Electronically Signed   By: Jacqulynn Cadet M.D.   On: 09/13/2014 11:32   Ir US Guide Vasc Access Right  09/13/2014   INDICATION: 64 year old male currently undergoing dialysis via a right neck temporary catheter. Conversion to a tunneled hemodialysis catheter is warranted.  EXAM: TUNNELED CENTRAL VENOUS HEMODIALYSIS CATHETER PLACEMENT WITH ULTRASOUND AND FLUOROSCOPIC GUIDANCE  MEDICATIONS: Ancef 2 gm IV; The IV antibiotic was given in an appropriate time interval prior to skin puncture.  CONTRAST:  None  ANESTHESIA/SEDATION: Versed 1.5 mg IV; Fentanyl 50 mcg IV  Total Moderate Sedation Time  22 minutes.  FLUOROSCOPY TIME:  1 minutes 6 seconds  44.6 mGy  COMPLICATIONS: None immediate  PROCEDURE: Informed written consent was obtained from the patient after a discussion of the risks, benefits, and alternatives to treatment. Questions regarding the procedure were encouraged and answered.  The existing right IJ non tunneled hemodialysis catheter was removed. Hemostasis was attained by gentle manual pressure.  The right neck and chest were prepped with chlorhexidine in a sterile fashion, and a sterile drape was applied covering the operative field. Maximum barrier sterile technique with sterile gowns and gloves were used for the procedure. A timeout was performed prior to the initiation of the procedure.  After creating a  small venotomy incision, a micropuncture kit was utilized to access the right internal jugular vein under direct, real-time ultrasound guidance after the overlying soft tissues were anesthetized with 1% lidocaine with epinephrine. Ultrasound image documentation was performed. The microwire was kinked to measure appropriate catheter length. A stiff glidewire was advanced to the level of the IVC and the micropuncture sheath was exchanged for a peel-away sheath. A Palindrome tunneled hemodialysis catheter measuring 23 cm from tip to cuff was tunneled in a retrograde fashion from the anterior chest wall to the venotomy incision.  The catheter was then placed through the peel-away sheath with tips ultimately positioned within the mid aspect of the right atrium. Final catheter positioning was confirmed and documented with a spot radiographic image. The catheter aspirates and flushes normally. The catheter was flushed with appropriate volume heparin dwells.  The catheter exit site was secured with a 0-Prolene retention suture. The venotomy incision was closed with an interrupted 4-0 Vicryl, and Dermabond. Dressings were applied. The patient tolerated the procedure well without immediate post procedural complication.  IMPRESSION: Successful placement of 23 cm tip to cuff tunneled hemodialysis catheter via the right internal jugular vein with tips terminating within the superior aspect of the right atrium. The catheter is ready for immediate use.  Signed,  Criselda Peaches, MD  [facility]  [facility]   Electronically Signed   By: Jacqulynn Cadet M.D.   On: 09/13/2014 11:32     Medications:     Scheduled Medications: . sodium chloride   Intravenous Once  . sodium chloride  10 mL/hr Intravenous Once  . sodium chloride   Intravenous Once  . amiodarone  400 mg Oral BID  . atorvastatin  40 mg Oral q1800  . bisacodyl  5 mg Oral QHS  . cefTRIAXone (ROCEPHIN)  IV  1  g Intravenous Q24H  . chlorhexidine  15 mL Mouth/Throat BID  . darbepoetin (ARANESP) injection - DIALYSIS  100 mcg Intravenous Q Mon-HD  . DULoxetine  30 mg Oral Daily  . feeding supplement (NEPRO CARB STEADY)  237 mL Oral BID BM  . insulin aspart  0-9 Units Subcutaneous TID AC & HS  . levothyroxine  75 mcg Oral QAC breakfast  . sildenafil  80 mg Oral TID  . sodium chloride irrigation  200 mL Irrigation Q2H while awake    Infusions: . sodium chloride 10 mL/hr at 09/14/14 0700  . DOBUTamine 2 mcg/kg/min (09/14/14 0926)  . heparin 1,850 Units/hr (09/14/14 0700)  . sodium chloride irrigation      PRN Medications: albuterol, ALPRAZolam, fentaNYL, midazolam, morphine injection, ondansetron (ZOFRAN) IV, oxyCODONE-acetaminophen **AND** oxyCODONE, sodium chloride, temazepam   Assessment:  1. Left  Hip Fracture s/p IM nail on 08/26/14 2. Chronic systolic CHF- Nonischemic cardiomyopathy, on home milrinone 0.5 mcg/kg/min, decreased to 0.25 here with NSVT. Became hypotensive, now on dobutamine 2.5.  3. Acute on chronic renal failure - suspect post-op ATN.  CVVH => HD.  4.  Paroxysmal atrial fibrillation requiring DCCV 02/2014, 04/2014- on chronic amio and xarelto prior to admission. CHADS VASc Score- 5.  Remains in NSR.  5. Diabetes mellitus type 2 6. Depression/anxiety 7. OSA, complex- Needs Bipap at night but had difficulty after dental procedure.  8 Pulmonary HTN - Mixed pulmonary venous hypertension and PAH- on Revatio 80 mg tid 9. Dental extractions 08/2014 by Dr Enrique Sack 10. Obesity- Body mass index is 32.39 kg/(m^2). 11. Probable chronic respiratory failure - requiring home O2 with ambulation as of this admission 12. Hyponatremia   13. NSVT 14. Delirium- Improved.  15. UTI- ceftriaxone 16. Anemia- renal disease/chronic disease.  Transfused 1 unit on 8/5.  17. Hemorrhagic shock with large rectus sheath hematoma 8/6  Plan/Discussion:    He has a very large rectus sheath hematoma with  hemorrhagic shock. CVP 1. Heparin stopped. Will give another liter of fluids and transfuse a second unit of RBCs. Given INR 1.7 (last dose of coumadin 8/2) will give 2u FFP. Repeat CBC at transfusion. Will likely need more blood.   Given acute event, I had long talk with patient and family about goals of care particularly in light of the fact that he is now requiring inotropes and HD (which has been hard for him to tolerate due to anxiety). I told them that he will not be able to receive outpatient HD while on inotropes. So options are very limited. They would like to continue with aggressive care and Full Code status at this point.   PD may be a long-term option but the process is complicated and can take weeks to months to arrange. Once improved can try to wean dobutamine as tolerated.  Will move to ICU.   Have discussed with [facility]. Will put on our service for now with understanding that [facility] will resume care on transfer out of ICU.  The patient is critically ill with multiple organ systems failure and requires high complexity decision making for assessment and support, frequent evaluation and titration of therapies, application of advanced monitoring technologies and extensive interpretation of multiple databases.   Critical Care Time devoted to patient care services described in this note is 60 Minutes.    Length of Stay: 20  Bensimhon, Daniel,MD 10:20 AM Advanced Heart Failure Team Pager [phone] (M-F; 7a - 4p)  Please contact [facility] for night-coverage after hours (4p -7a ) and weekends on [email or web address]

## 2014-09-14 NOTE — Procedures (Signed)
Patient was seen on dialysis and the procedure was supervised.  BFR 300  Via PC BP is  115/57.   Patient appears to be tolerating treatment well- nervous/nauseated  Larry [name] A 09/14/2014

## 2014-09-15 ENCOUNTER — Encounter (HOSPITAL_BASED_OUTPATIENT_CLINIC_OR_DEPARTMENT_OTHER): Payer: Self-pay

## 2014-09-15 ENCOUNTER — Encounter (HOSPITAL_COMMUNITY): Payer: Self-pay | Admitting: Radiology

## 2014-09-15 ENCOUNTER — Inpatient Hospital Stay (HOSPITAL_COMMUNITY): Payer: Commercial Managed Care - HMO

## 2014-09-15 DIAGNOSIS — R579 Shock, unspecified: Secondary | ICD-10-CM

## 2014-09-15 DIAGNOSIS — R578 Other shock: Secondary | ICD-10-CM | POA: Insufficient documentation

## 2014-09-15 LAB — MAGNESIUM: Magnesium: 2 mg/dL (ref 1.7–2.4)

## 2014-09-15 LAB — CBC
HCT: 23.7 % — ABNORMAL LOW (ref 39.0–52.0)
HCT: 31.9 % — ABNORMAL LOW (ref 39.0–52.0)
HEMATOCRIT: 18 % — AB (ref 39.0–52.0)
HEMATOCRIT: 22.1 % — AB (ref 39.0–52.0)
HEMOGLOBIN: 6 g/dL — AB (ref 13.0–17.0)
HEMOGLOBIN: 10.9 g/dL — AB (ref 13.0–17.0)
Hemoglobin: 7.6 g/dL — ABNORMAL LOW (ref 13.0–17.0)
Hemoglobin: 7.2 g/dL — ABNORMAL LOW (ref 13.0–17.0)
MCH: 30.4 pg (ref 26.0–34.0)
MCH: 30.6 pg (ref 26.0–34.0)
MCH: 30.8 pg (ref 26.0–34.0)
MCH: 30.2 pg (ref 26.0–34.0)
MCHC: 32.1 g/dL (ref 30.0–36.0)
MCHC: 33.3 g/dL (ref 30.0–36.0)
MCHC: 32.6 g/dL (ref 30.0–36.0)
MCHC: 34.2 g/dL (ref 30.0–36.0)
MCV: 94.8 fL (ref 78.0–100.0)
MCV: 91.8 fL (ref 78.0–100.0)
MCV: 94.4 fL (ref 78.0–100.0)
MCV: 88.4 fL (ref 78.0–100.0)
PLATELETS: 196 10*3/uL (ref 150–400)
PLATELETS: 109 10*3/uL — AB (ref 150–400)
PLATELETS: 131 10*3/uL — AB (ref 150–400)
Platelets: 126 10*3/uL — ABNORMAL LOW (ref 150–400)
RBC: 2.5 MIL/uL — ABNORMAL LOW (ref 4.22–5.81)
RBC: 1.96 MIL/uL — ABNORMAL LOW (ref 4.22–5.81)
RBC: 2.34 MIL/uL — AB (ref 4.22–5.81)
RBC: 3.61 MIL/uL — ABNORMAL LOW (ref 4.22–5.81)
RDW: 17.4 % — AB (ref 11.5–15.5)
RDW: 15.7 % — AB (ref 11.5–15.5)
RDW: 16 % — ABNORMAL HIGH (ref 11.5–15.5)
RDW: 14.9 % (ref 11.5–15.5)
WBC: 9.9 10*3/uL (ref 4.0–10.5)
WBC: 11.4 10*3/uL — ABNORMAL HIGH (ref 4.0–10.5)
WBC: 10.6 10*3/uL — ABNORMAL HIGH (ref 4.0–10.5)
WBC: 14.1 10*3/uL — ABNORMAL HIGH (ref 4.0–10.5)

## 2014-09-15 LAB — LACTIC ACID, PLASMA: Lactic Acid, Venous: 1.4 mmol/L (ref 0.5–2.0)

## 2014-09-15 LAB — PREPARE RBC (CROSSMATCH)

## 2014-09-15 LAB — CARBOXYHEMOGLOBIN
Carboxyhemoglobin: 1.9 % — ABNORMAL HIGH (ref 0.5–1.5)
METHEMOGLOBIN: 1.3 % (ref 0.0–1.5)
O2 SAT: 62.3 %
Total hemoglobin: 7.8 g/dL — ABNORMAL LOW (ref 13.5–18.0)

## 2014-09-15 LAB — BASIC METABOLIC PANEL
Anion gap: 10 (ref 5–15)
Anion gap: 10 (ref 5–15)
BUN: 25 mg/dL — ABNORMAL HIGH (ref 6–20)
BUN: 35 mg/dL — AB (ref 6–20)
CO2: 26 mmol/L (ref 22–32)
CO2: 23 mmol/L (ref 22–32)
CREATININE: 3.48 mg/dL — AB (ref 0.61–1.24)
Calcium: 8 mg/dL — ABNORMAL LOW (ref 8.9–10.3)
Calcium: 7.6 mg/dL — ABNORMAL LOW (ref 8.9–10.3)
Chloride: 96 mmol/L — ABNORMAL LOW (ref 101–111)
Chloride: 94 mmol/L — ABNORMAL LOW (ref 101–111)
Creatinine, Ser: 2.66 mg/dL — ABNORMAL HIGH (ref 0.61–1.24)
GFR calc Af Amer: 28 mL/min — ABNORMAL LOW (ref >60)
GFR calc Af Amer: 20 mL/min — ABNORMAL LOW (ref >60)
GFR calc non Af Amer: 17 mL/min — ABNORMAL LOW (ref >60)
GFR, EST NON AFRICAN AMERICAN: 24 mL/min — AB (ref >60)
GLUCOSE: 215 mg/dL — AB (ref 65–99)
Glucose, Bld: 120 mg/dL — ABNORMAL HIGH (ref 65–99)
POTASSIUM: 4.3 mmol/L (ref 3.5–5.1)
POTASSIUM: 4.5 mmol/L (ref 3.5–5.1)
SODIUM: 132 mmol/L — AB (ref 135–145)
Sodium: 127 mmol/L — ABNORMAL LOW (ref 135–145)

## 2014-09-15 LAB — GLUCOSE, CAPILLARY
GLUCOSE-CAPILLARY: 160 mg/dL — AB (ref 65–99)
Glucose-Capillary: 133 mg/dL — ABNORMAL HIGH (ref 65–99)
Glucose-Capillary: 391 mg/dL — ABNORMAL HIGH (ref 65–99)
Glucose-Capillary: 195 mg/dL — ABNORMAL HIGH (ref 65–99)
Glucose-Capillary: 185 mg/dL — ABNORMAL HIGH (ref 65–99)

## 2014-09-15 LAB — HEMOGLOBIN AND HEMATOCRIT, BLOOD
HCT: 31.9 % — ABNORMAL LOW (ref 39.0–52.0)
Hemoglobin: 11.2 g/dL — ABNORMAL LOW (ref 13.0–17.0)

## 2014-09-15 LAB — PROTIME-INR
INR: 1.69 — ABNORMAL HIGH (ref 0.00–1.49)
INR: 2.16 — ABNORMAL HIGH (ref 0.00–1.49)
PROTHROMBIN TIME: 23.9 s — AB (ref 11.6–15.2)
Prothrombin Time: 19.9 seconds — ABNORMAL HIGH (ref 11.6–15.2)

## 2014-09-15 LAB — HEPARIN LEVEL (UNFRACTIONATED): Heparin Unfractionated: 0.45 IU/mL (ref 0.30–0.70)

## 2014-09-15 MED ORDER — SODIUM CHLORIDE 0.9 % IV BOLUS (SEPSIS): 250.0000 mL | Freq: Once | INTRAVENOUS | Status: AC

## 2014-09-15 MED ORDER — DEXTROSE 5 % IV SOLN
0.0000 ug/min | INTRAVENOUS | Status: DC
  Administered 2014-09-15: 22 ug/min via INTRAVENOUS
  Administered 2014-09-16: 30 ug/min via INTRAVENOUS
  Administered 2014-09-16: 18 ug/min via INTRAVENOUS
  Filled 2014-09-15 (×3): qty 16

## 2014-09-15 MED ORDER — SODIUM CHLORIDE 0.9 % IV SOLN
Freq: Once | INTRAVENOUS | Status: AC
  Administered 2014-09-15: 15:00:00 via INTRAVENOUS

## 2014-09-15 MED ORDER — SODIUM CHLORIDE 0.9 % IV SOLN
Freq: Once | INTRAVENOUS | Status: AC
  Administered 2014-09-15: 12:00:00 via INTRAVENOUS

## 2014-09-15 MED ORDER — SODIUM CHLORIDE 0.9 % IV SOLN
Freq: Once | INTRAVENOUS | Status: AC
  Administered 2014-09-15: 19:00:00 via INTRAVENOUS

## 2014-09-15 MED ORDER — SODIUM CHLORIDE 0.9 % IV SOLN: INTRAVENOUS | Status: DC | PRN

## 2014-09-15 MED ORDER — SODIUM CHLORIDE 0.9 % IV SOLN
Freq: Once | INTRAVENOUS | Status: AC
  Administered 2014-09-15: 250 mL via INTRAVENOUS

## 2014-09-15 MED ORDER — FENTANYL CITRATE (PF) 100 MCG/2ML IJ SOLN
25.0000 ug | INTRAMUSCULAR | Status: DC | PRN
  Administered 2014-09-15 – 2014-09-17 (×11): 25 ug via INTRAVENOUS
  Filled 2014-09-15 (×11): qty 2

## 2014-09-15 MED ORDER — DEXTROSE 5 % IV SOLN
0.0000 ug/min | INTRAVENOUS | Status: DC
  Administered 2014-09-15: 2 ug/min via INTRAVENOUS
  Administered 2014-09-15: 22 ug/min via INTRAVENOUS
  Filled 2014-09-15 (×3): qty 4

## 2014-09-15 MED ORDER — FENTANYL BOLUS VIA INFUSION
25.0000 ug | INTRAVENOUS | Status: DC | PRN
  Filled 2014-09-15: qty 50

## 2014-09-15 MED ORDER — MIDAZOLAM BOLUS VIA INFUSION
1.0000 mg | Freq: Once | INTRAVENOUS | Status: DC
  Filled 2014-09-15: qty 2

## 2014-09-15 MED ORDER — MIDAZOLAM HCL 2 MG/2ML IJ SOLN
1.0000 mg | Freq: Once | INTRAMUSCULAR | Status: AC
  Administered 2014-09-15: 2 mg via INTRAVENOUS
  Filled 2014-09-15: qty 2

## 2014-09-15 NOTE — Procedures (Signed)
Arterial Catheter Insertion Procedure Note Larry [name] [id number] [birth year]  Procedure: Insertion of Arterial Catheter  Indications: Blood pressure monitoring and Frequent blood sampling  Procedure Details Consent: Risks of procedure as well as the alternatives and risks of each were explained to the (patient/caregiver).  Consent for procedure obtained. Time Out: Verified patient identification, verified procedure, site/side was marked, verified correct patient position, special equipment/implants available, medications/allergies/relevent history reviewed, required imaging and test results available.  Performed  Maximum sterile technique was used including antiseptics, cap, gloves, gown, hand hygiene, mask and sheet. Skin prep: Chlorhexidine; local anesthetic administered 20 gauge catheter was inserted into left radial artery using the Seldinger technique.  Evaluation Blood flow good; BP tracing good. Complications: No apparent complications. RT placed arrow catheter on first attempt.   Larry [name] 09/15/2014

## 2014-09-15 NOTE — Consult Note (Signed)
PULMONARY / CRITICAL CARE MEDICINE   Name: Larry [name] MRN: [id number] DOB: 10/08/1950    ADMISSION DATE:  08/25/2014 CONSULTATION DATE:  09/15/2014  REFERRING MD :  Bensimhon  CHIEF COMPLAINT:  Abdominal pain  INITIAL PRESENTATION:  64 yo male with hx of non ischemic CM on chronic milrinone was initially admitted for Lt hip fx.  Developed anemia, hypotension, and abd pain >> found to have retroperitoneal hematoma.  STUDIES:  7/13 Rt heart cath 8/07 CT abd/pelvis >> Rt retroperitoneal hematoma, hemorrhage in psoas muscle  SIGNIFICANT EVENTS: 7/17 Admit, ortho consulted 7/18 Lt femoral intramedullary nailing 7/21 to SDU 7/22 Renal consulted 7/23 started CRRT, changed from H. Rivera Colon to heparin gtt 7/24 milrinone restarted 7/27 d/c milrinone, start levophed 7/28 add dobutamine 7/31 change to intermittent HD 8/05 IR placed Rt IJ tunneled dialysis catheter 8/06 Rt abd/flank pain, heparin d/c, transfuse PRBC 8/07 To ICU   HISTORY OF PRESENT ILLNESS:   64 yo male was admitted for Lt femur fx after fall.  He is followed by cardiology for cardiomyopathy on milrinone as outpt, a fib on xarelto, and CKD.  He has hx of OSA and OHS, but has not been able to tolerate CPAP/BiPAP as outpt.  His hospital course was complicate by AKI and he was started on CRRT.  He also had hypotension.  He had milrinone changed to dobutamine, and started on levophed.  He was changed from xarelto to heparin gtt.  He had IR place Rt IJ tunneled HD catheter.  He started developing Rt flank/abdominal pain on 8/06, and this was associated with anemia requiring blood transfusions.  He had CT abd/pelvis which showed Rt retroperitoneal hematoma, and hemorrhage in psoas muscle.  PAST MEDICAL HISTORY :   has a past medical history of NICM (nonischemic cardiomyopathy); CAD (coronary artery disease); Obesity; CVA (cerebral vascular accident); Pituitary tumor; Depression; Erectile dysfunction; DDD (degenerative disc  disease); OSA (obstructive sleep apnea); Monoclonal gammopathy; CKD (chronic kidney disease), stage III; Polyneuropathy in diabetes(357.2); Hypogonadotropic hypogonadism; Polycythemia, secondary; Hypercholesterolemia; Back pain; Monoclonal gammopathy of unknown significance; Neck pain; PAF (paroxysmal atrial fibrillation) (06/04/10); History of diverticulitis of colon; Type II or unspecified type diabetes mellitus with neurological manifestations, uncontrolled; Nonproliferative diabetic retinopathy 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  has past surgical history that includes gamma knife surgery for pituitary tumor; Tonsillectomy; Cardiac defibrillator placement (02/19/10); right heart catheterization (N/A, 09/17/2013); Cardiac catheterization; Brain surgery; Insert / replace / remove pacemaker; Cardioversion (N/A, 02/14/2014); Cardioversion (N/A, 05/02/2014); right heart catheterization (N/A, 05/08/2014); Cardiac catheterization (N/A, 06/26/2014); Cardiac catheterization (N/A, 08/21/2014); Multiple extractions with alveoloplasty (N/A, 08/22/2014); Cataract extraction; and Femur IM nail (Left, 08/26/2014). Prior to Admission medications   Medication Sig Start Date End Date Taking? Authorizing Provider  ALPRAZolam Duanne Moron) 0.5 MG tablet Take 0.5 mg by mouth See admin instructions. Take 1 tablet (0.5 mg) every night, may take an additional tablet two more times during the day as needed for anxiety   Yes Historical Provider, MD  amiodarone (PACERONE) 200 MG tablet Take 1 tablet (200 mg total) by mouth daily. 06/19/14  Yes [name], MD  atorvastatin (LIPITOR) 40 MG tablet TAKE 1 TABLET DAILY 03/18/14  Yes [name], MD  bisacodyl  (DULCOLAX) 5 MG EC tablet Take 5 mg by mouth at bedtime.   Yes Historical Provider, MD  chlorhexidine (PERIDEX) 0.12 % solution Perform mouth rinses  twice daily after breakfast and at bedtime. Patient taking differently: Use as directed in the mouth or throat 2 (two) times daily. Perform mouth rinses twice daily after breakfast and at bedtime (swish and spit) 08/24/14  Yes Dayna N Dunn, PA-C  digoxin (LANOXIN) 0.125 MG tablet Take 0.5 tablets (0.0625 mg total) by mouth daily. 05/01/14  Yes [name], MD  DULoxetine (CYMBALTA) 30 MG capsule Take 60 mg by mouth 2 (two) times daily.    Yes Historical Provider, MD  GLUCAGON EMERGENCY 1 MG injection Inject 1 mg as directed once as needed (hypotension).  01/30/14  Yes Historical Provider, MD  hydrALAZINE (APRESOLINE) 100 MG tablet Take 1 tablet (100 mg total) by mouth 3 (three) times daily. 08/24/14  Yes Dayna N Dunn, PA-C  insulin glargine (LANTUS) 100 unit/mL SOPN Inject 30 Units into the skin at bedtime.   Yes Historical Provider, MD  insulin lispro (HUMALOG KWIKPEN) 100 UNIT/ML KiwkPen Inject 30 Units into the skin 3 (three) times daily before meals.   Yes Historical Provider, MD  levothyroxine (SYNTHROID, LEVOTHROID) 75 MCG tablet TAKE 1 TABLET ON AN EMPTY STOMACH 30 MINUTES BEFORE BREAKFAST 07/13/14  Yes Historical Provider, MD  Melatonin 10 MG TABS Take 10 mg by mouth at bedtime as needed (sleep).    Yes Historical Provider, MD  metoprolol succinate (TOPROL-XL) 25 MG 24 hr tablet Take 1 tablet (25 mg total) by mouth 2 (two) times daily before a meal. 05/29/14  Yes Amy D Clegg, NP  milrinone (PRIMACOR) 20 MG/100ML SOLN infusion Inject 51.2 mcg/min into the vein continuous. (0.63mcg/kg/min) 08/24/14  Yes Dayna N Dunn, PA-C  Multiple Vitamin (MULITIVITAMIN WITH MINERALS) TABS Take 1 tablet by mouth daily.   Yes Historical Provider, MD  oxyCODONE-acetaminophen (PERCOCET) 10-325 MG per tablet Take 1 tablet by mouth every 4 (four) hours as needed for pain.   08/08/14  Yes Historical Provider, MD  Potassium Chloride ER 20 MEQ TBCR Take 20 mEq by mouth 2 (two) times daily. 08/15/14  Yes Dayna N Dunn, PA-C  rivaroxaban (XARELTO) 15 MG TABS tablet Take 1 tablet (15 mg total) by mouth daily with supper. 08/13/14  Yes [name], MD  sildenafil (REVATIO) 20 MG tablet Take 4 tablets (80 mg total) by mouth 3 (three) times daily. 08/24/14  Yes Dayna N Dunn, PA-C  spironolactone (ALDACTONE) 25 MG tablet TAKE 1 TABLET DAILY 08/14/14  Yes Jettie Booze, MD  torsemide (DEMADEX) 20 MG tablet Take 3 tablets (60 mg total) by mouth 2 (two) times daily. 08/24/14  Yes Dayna N Dunn, PA-C  zolpidem (AMBIEN) 5 MG tablet Take 1 tablet (5 mg total) by mouth at bedtime as needed for sleep. 06/04/14  Yes [name], MD  HYDROcodone-acetaminophen (NORCO) 5-325 MG per tablet Take 1-2 tablets by mouth every 6 (six) hours as needed for moderate pain. 08/26/14   Brittney Claiborne Billings, PA-C  oxyCODONE-acetaminophen (PERCOCET/ROXICET) 5-325 MG per tablet Take 1-2 tablets by mouth every 4-6 hours as needed for moderate-severe pain. Patient not taking: Reported on 08/25/2014 08/24/14   Dayna N Dunn, PA-C   Allergies  Allergen Reactions  . Bydureon [Exenatide] Other (See Comments)    sweating  . Losartan Potassium Other (See Comments)    insomnia    FAMILY HISTORY:  indicated that his mother is alive. He indicated that his father is deceased. He indicated that his sister is alive. He indicated that his brother is deceased. He indicated that his maternal grandmother is deceased. He indicated that  his maternal grandfather is deceased. He indicated that his paternal grandmother is deceased. He indicated that his paternal grandfather is deceased. He indicated that his paternal uncle is deceased.  SOCIAL HISTORY:  reports that he quit smoking about 31 years ago. His smoking use included Cigars. He has never used smokeless tobacco. He reports that he does not drink alcohol or use illicit  drugs.  REVIEW OF SYSTEMS:   Denies chest pain, dyspnea, headache, nausea.  Has pain in his LT hip area.  SUBJECTIVE:   VITAL SIGNS: Temp:  [97.3 F (36.3 C)-98.6 F (37 C)] 97.6 F (36.4 C) (08/07 1948) Pulse Rate:  [39-105] 78 (08/07 2130) Resp:  [10-25] 16 (08/07 2215) BP: (59-135)/(24-91) 112/34 mmHg (08/07 2215) SpO2:  [61 %-100 %] 100 % (08/07 2130) HEMODYNAMICS: CVP:  [3 mmHg-13 mmHg] 8 mmHg INTAKE / OUTPUT:  Intake/Output Summary (Last 24 hours) at 09/15/14 2222 Last data filed at 09/15/14 1946  Gross per 24 hour  Intake 3749.7 ml  Output    150 ml  Net 3599.7 ml    PHYSICAL EXAMINATION: General: seen in ICU Neuro:  Alert, normal strength, moves extremities HEENT:  No sinus tenderness, edentulous Cardiovascular: irregular, no murmur Lungs:  No wheeze, faint basilar crackles Abdomen:  Tender in Rt flank Musculoskeletal:  1+ edema, DP pulses palpable Skin:  No rashes  LABS:  CBC  Recent Labs Lab 09/15/14 1330 09/15/14 1700 09/15/14 2110  WBC 11.4* 10.6* 14.1*  HGB 6.0* 7.2* 10.9*  HCT 18.0* 22.1* 31.9*  PLT 126* 109* 131*   Coag's  Recent Labs Lab 09/09/14 0214  09/14/14 0325 09/15/14 0350 09/15/14 1700  APTT 50*  --   --   --   --   INR  --   < > 1.78* 1.69* 2.16*  < > = values in this interval not displayed.   BMET  Recent Labs Lab 09/13/14 0530 09/14/14 0325 09/15/14 0306  NA 131* 127* 132*  K 4.0 3.9 4.3  CL 93* 90* 96*  CO2 27 24 26   BUN 24* 37* 25*  CREATININE 3.47* 4.00* 2.66*  GLUCOSE 85 133* 120*   Electrolytes  Recent Labs Lab 09/10/14 0525 09/11/14 0452 09/12/14 0630 09/13/14 0530 09/14/14 0325 09/15/14 0306  CALCIUM 8.3* 8.6* 8.6* 8.2* 8.3* 8.0*  MG 2.2 2.3 2.2  --   --  2.0  PHOS 4.1 5.2* 6.0*  --   --   --    Sepsis Markers  Recent Labs Lab 09/15/14 1215  LATICACIDVEN 1.4   ABG No results for input(s): PHART, PCO2ART, PO2ART in the last 168 hours.   Liver Enzymes  Recent Labs Lab  09/10/14 0525 09/11/14 0452 09/12/14 0630  ALBUMIN 2.4* 2.4* 2.5*   Cardiac Enzymes No results for input(s): TROPONINI, PROBNP in the last 168 hours.   Glucose  Recent Labs Lab 09/15/14 0729 09/15/14 1250 09/15/14 1610 09/15/14 1703 09/15/14 1705 09/15/14 2125  GLUCAP 160* 133* 391* >600* 195* 185*    Imaging Ct Abdomen Pelvis Wo Contrast  09/15/2014   CLINICAL DATA:  Acute onset of severe abdominal pain. Initial encounter.  EXAM: CT ABDOMEN AND PELVIS WITHOUT CONTRAST  TECHNIQUE: Multidetector CT imaging of the abdomen and pelvis was performed following the standard protocol without IV contrast.  COMPARISON:  CT of the abdomen and pelvis performed 05/27/2014, and renal ultrasound performed 08/31/2014  FINDINGS: Small bilateral pleural effusions are noted, with bibasilar atelectasis and scattered calcifications likely reflecting calcified granulomata. Diffuse coronary artery calcifications are seen. A  pacemaker lead is partially imaged.  There is a very large right retroperitoneal hematoma, measuring 24.2 x 16.9 x 19.0 cm, containing a combination of acute and chronic hemorrhage. This appears partially contiguous with additional acute blood tracking superiorly along the retroperitoneum, and blood tracking about the IVC and the anterior aspect of Gerota's fascia. Trace blood tracks about the duodenum, which is displaced anteriorly by the large collection.  There appears to be some degree of hemorrhage within the right psoas muscle more inferiorly, with a somewhat unusual 4.3 x 3.3 x 2.6 cm low attenuation collection within the right psoas. This is not well assessed without contrast, but raises concern for underlying abscess within the right psoas. Alternatively, it could reflect unusual accumulation of chronic blood within the psoas.  There is also a small amount of intraperitoneal hemorrhage tracking along the the paracolic gutters bilaterally into the pelvis. A small amount of blood is seen  tracking about the liver and spleen, and a small amount of lower attenuation fluid is seen tracking into a small umbilical hernia.  The liver and spleen are otherwise unremarkable in appearance. The gallbladder is distended but likely within normal limits. The pancreas and adrenal glands are unremarkable.  The very large retroperitoneal hematoma displaces the right kidney anteriorly. Nonspecific perinephric stranding is noted bilaterally. There is no evidence of hydronephrosis. No renal or ureteral stones are seen.  No free fluid is identified. The small bowel is unremarkable in appearance. The stomach is within normal limits. No acute vascular abnormalities are seen. Scattered calcification is noted along the abdominal aorta and its branches.  The appendix is normal in caliber, without evidence of appendicitis. Scattered diverticulosis is noted along the descending and sigmoid colon, without evidence of diverticulitis.  The bladder is mildly distended. A small amount of air within the bladder is of uncertain significance. Would correlate for recent Foley catheter placement. The prostate is borderline normal in size. No inguinal lymphadenopathy is seen.  No acute osseous abnormalities are identified. The patient's left hip hardware is grossly unremarkable, though incompletely imaged. A residual underlying left femoral intertrochanteric fracture line is partially imaged.  IMPRESSION: 1. Very large right retroperitoneal hematoma, measuring 24.2 x 16.9 x 19.0 cm, containing a combination of acute and chronic hemorrhage. This appears partially contiguous with additional acute blood tracking superiorly along the retroperitoneum, and blood tracking about the IVC and the anterior aspect of Gerota's fascia. Trace blood tracks about the duodenum. This likely reflects persistent hemorrhage status post the patient's prior cardiac catheterization on 08/21/2014. It may reflect some degree of injury to the IVC, given the presence  of blood about the IVC and within the perineum. 2. Small amount of intraperitoneal hemorrhage tracks along the paracolic gutters bilaterally into the pelvis. Small amount of blood seen tracking about the liver and spleen. Small amount of lower attenuation fluid may reflect chronic blood, tracking into a small umbilical hernia. 3. Some degree of hemorrhage within the right psoas muscle more inferiorly. Associated unusual 4.3 x 3.3 x 2.6 cm low attenuation collection within the right psoas muscle. This is not well assessed without contrast, but raises concern for underlying abscess within the right psoas. Alternately, it could reflect unusual accumulation of chronic blood within the psoas. 4. Small bilateral pleural effusions, with bibasilar atelectasis and scattered associated calcifications likely reflecting calcified granulomata. 5. Anterior displacement of the right kidney and duodenum by the very large hematoma. 6. Small amount of air noted within the bladder, of uncertain significance. Would correlate for  recent Foley catheter placement. 7. Scattered diverticulosis along the descending and sigmoid colon, without evidence of diverticulitis. 8. Scattered calcification along the abdominal aorta and its branches. 9. Diffuse coronary artery calcifications seen.  Critical Value/emergent results were called by telephone at the time of interpretation on 09/15/2014 at 9:36 pm to the [facility] physician on call, who verbally acknowledged these results.   Electronically Signed   By: Garald Balding M.D.   On: 09/15/2014 21:48     ASSESSMENT / PLAN:  PULMONARY A: Hx of OSA, OHS >> not able to tolerate CPAP/BiPAP as outpt. P:   Oxygen to keep SpO2 > 92%  CARDIOVASCULAR Rt IJ HD catheter 7/23 >> 8/05 Rt IJ tunneled catheter 8/05 >> Rt PICC >>  A:  Cardiomyopathy with acute on chronic systolic heart failure. Pulmonary HTN. A fib. NSVT. Non obstructive CAD. P:  Amiodarone, lipitor, revatio per  cardiology Dobutamine, levophed per cardiology  RENAL A:   AKI. CKD stage 4. P:   HD per renal  GASTROINTESTINAL A:   Abdominal pain from Retroperitoneal hematoma. P:   PRN analgesics  HEMATOLOGIC A:   Retroperitoneal hematoma. P:  F/u CBC, coags and transfuse as needed SCD's for DVT prevention  INFECTIOUS A:   UTI. P:   Day 6 of rocephin  Urine 8/02 >> Serratia marcescens  ENDOCRINE A:   Hx of hypothyroidism. P:   Monitor blood sugar  NEUROLOGIC A:   Hx of depression. P:   Continue cymbalta  Updated pt's family at bedside.  CC time 45 minutes.  Chesley Mires, MD [facility] Pulmonary/Critical Care 09/15/2014, 10:22 PM Pager:  [phone] After 3pm call: [phone]

## 2014-09-15 NOTE — Progress Notes (Addendum)
[facility] PROGRESS NOTE  Larry [name] MIW:[id number] DOB: 11-22-1950 DOA: 08/25/2014 PCP: [name], MD   Interim summary 64 y/o man with history of chronic systolic CHF (nonischemic cardiomyopathy) s/p 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 He is on chronic milrinone 0.5 mcg. Admitted with fall---> broken L hip . 7/18 had IM Nail L femur. Course complicated with fluid overload and acute on chronic renal failure (due to ATN most liekely). Required CVVHD 7/23>> 7/31. Now on intermittent HD and being assessed for ESRD and HD dependency .  Assessment/Plan: 1.Hypotension; hypovolemia due to bleeding vs cardiogenic shock.  Concern for retroperitoneal bleed, dobutamine was increased. Repeated BP was 60 despite increase dobutamine. I have ordered 250 cc bolus. Have order one unit PRBC. Will repeat Hb stat.  -Phenylephrine ordered by cardio.   -Will hold heparin until CT scan negative for bleeding.  -check lactic acid.   Abdominal pain; Abdominal pain on right side. Stat CT anbdomen and pelvis without contrast.  Hold heparin.   Severe nonischemic cardiomyopathy with chronic systolic heart failure with an EF of 25%. History of V. tach. Patient has AICD, on home milrinone drip. Experienced hypotension requiring norepinephrine and now on dobutamine; plan is to continue dobutamine instead of milrinone. -Patient was started on CVVHD on 08/31/2014 until 09/08/2014 -Cardiology managing HF meds -He tolerated CVVHD; but has remained oliguric and was initiated on IHD (8/1), 8-4 -2L removed during HD on 8/1 -weight 100 Kg ---102---103--104. -Had HD 8-4, 8-6. -Continue with dobutamine.  .  Acute on chronic renal failure -Xarelto was discontinued and started on IV heparin -Nephrology was consulted, feeling renal failure likely represents postoperative ATN/hypoperfusion from hypotension -CVVH was started  on 08/31/2014 and stopped on 7/31 -still oliguric, LE edema.  -Nephrology managing  -plan is to observed and continue HD intermittently as needed last treatment on 8/1 , 8-4, plan for HD 8-6 -most likely will ended on permanent HD -if renal function recovers, patient might need Flomax prior to discontinuation of foley catheter.   . Constipation -Patient w/o diarrhea; reports BM are ok now. -last BM 8/1 -KUB negative for obstruction.  Had 2 BM 8-05, resume home dose dulcolax.   .  Hyponatremia -I suspect secondary to volume overload and renal failure -HD today.  -follow trend.   . Mechanical trip and fall with left femoral intertrochanteric fracture.  -status post intramedullary nail to left hip on 7/18 -WBAT -PT/OT evaluation  . Paroxysmal atrial fibrillation and pulmonary HTN.  -Xarelto discontinued due to worsening kidney function.  -On Amio -continue revatio for pulm HTN -hold coumadin in case he required permanent access for dialysis.  -hold heparin due to concern for bleed.   . Obstructive sleep apnea.  -Patient refusing BiPAP;   . DM type II. recent A1c of 7.7 -lantus changed to 5 units along with SSI since 7/28, he had hypoglycemia on 7/27 -follow CBG's and adjust as needed -no further hypoglycemic events; eating ok  . Insomnia: -will continue using restoril PRN   -UTI; UA with too numerous to count WBC. Started on ceftriaxone 8-03. urine culture growing serratia/ sensitive to ceftriaxone./   Day 5/7.   -Stage 2 sacrum: Wound care consulted and recommendations appreciated.   -Anemia; received one unit PRBC 8-05. Repeated CBC pending. Further transfusion during hemodialysis as indicated.   Code Status: Full Family Communication: wife at bedside Disposition Plan: Patient in the process of been assessed for  ESRD. On Dobutamine drip for heart failure.    Consultants:  Ortho (Dr. Percell Miller)  HF team   Nephrology  Procedures:  S/P intramedullary nail on  left hip on 7/18  CVVH   Antibiotics:  ceftriaxone  HPI/Subjective: He is ill appearing, pale, he is alert, answer questions. He is complaining of  Worsening right side abdominal pain. Denies chest pain or dyspnea.     Objective: Filed Vitals:   09/15/14 0311  BP: 111/45  Pulse: 73  Temp: 98.6 F (37 C)  Resp: 17    Intake/Output Summary (Last 24 hours) at 09/15/14 0841 Last data filed at 09/15/14 0600  Gross per 24 hour  Intake 1345.05 ml  Output   2250 ml  Net -904.95 ml   Filed Weights   09/14/14 0300 09/14/14 1550 09/14/14 2011  Weight: 104.7 kg (230 lb 13.2 oz) 105.2 kg (231 lb 14.8 oz) 103.6 kg (228 lb 6.3 oz)    Exam:   General:  He is  alert, oriented X 3, follows commands, chronically ill-appearing;   Cardiovascular:no rubs, no gallops, RRR  Respiratory: Normal respiratory effort, lungs are clear to auscultation bilaterally  Abdomen: soft, , positive Bowel sounds, distended,, tenderness worse right side.   Musculoskeletal: Surgical incision site appears clean, no evidence of infection. Plus edema   Data Reviewed: Basic Metabolic Panel:  Recent Labs Lab 09/09/14 0400 09/10/14 0525 09/11/14 0452 09/12/14 0630 09/13/14 0530 09/14/14 0325 09/15/14 0306  NA 128* 134* 131* 125* 131* 127* 132*  K 4.7 4.4 4.3 4.8 4.0 3.9 4.3  CL 98* 97* 96* 90* 93* 90* 96*  CO2 _0 GLUCOSE 135* 99 126* 129* 85 133* 120*  BUN 20 18 31* 45* 24* 37* 25*  CREATININE 3.18* 2.85* 3.94* 4.87* 3.47* 4.00* 2.66*  CALCIUM 8.9 8.3* 8.6* 8.6* 8.2* 8.3* 8.0*  MG 2.3 2.2 2.3 2.2  --   --  2.0  PHOS 3.3 4.1 5.2* 6.0*  --   --   --    CBC:  Recent Labs Lab 09/11/14 0452 09/12/14 0630 09/13/14 0530 09/14/14 1552 09/15/14 0306  WBC 9.2 8.4 8.0 9.1 9.9  HGB 8.1* 8.4* 7.6* 8.8* 7.6*  HCT 26.1* 27.0* 24.0* 27.6* 23.7*  MCV 97.4 96.8 96.0 94.8 94.8  PLT 202 229 201 215 196   BNP (last 3 results)  Recent Labs  05/22/14 1029 05/27/14 1126  06/19/14 1030  BNP 935.2* 1627.8* 738.9*    ProBNP (last 3 results)  Recent Labs  10/09/13 0926 11/14/13 0926 01/14/14 1112  PROBNP 1561.0* 1261.0* 5769.0*   CBG:  Recent Labs Lab 09/13/14 2102 09/14/14 0822 09/14/14 1108 09/14/14 2231 09/15/14 0729  GLUCAP 115* 120* 116* 108* 160*    Recent Results (from the past 240 hour(s))  Urine culture     Status: None   Collection Time: 09/10/14  9:30 AM  Result Value Ref Range Status   Specimen Description URINE, RANDOM  Final   Special Requests NONE  Final   Culture >=100,000 COLONIES/mL SERRATIA MARCESCENS  Final   Report Status 09/12/2014 FINAL  Final   Organism ID, Bacteria SERRATIA MARCESCENS  Final      Susceptibility   Serratia marcescens - MIC*    CEFAZOLIN >=64 RESISTANT Resistant     CEFTRIAXONE <=1 SENSITIVE Sensitive     CIPROFLOXACIN <=0.25 SENSITIVE Sensitive     GENTAMICIN <=1 SENSITIVE Sensitive     NITROFURANTOIN 128 RESISTANT Resistant     TRIMETH/SULFA <=20  SENSITIVE Sensitive     * >=100,000 COLONIES/mL SERRATIA MARCESCENS     Studies: Ir Fluoro Guide Cv Line Right  09/13/2014   INDICATION: 64 year old male currently undergoing dialysis via a right neck temporary catheter. Conversion to a tunneled hemodialysis catheter is warranted.  EXAM: TUNNELED CENTRAL VENOUS HEMODIALYSIS CATHETER PLACEMENT WITH ULTRASOUND AND FLUOROSCOPIC GUIDANCE  MEDICATIONS: Ancef 2 gm IV; The IV antibiotic was given in an appropriate time interval prior to skin puncture.  CONTRAST:  None  ANESTHESIA/SEDATION: Versed 1.5 mg IV; Fentanyl 50 mcg IV  Total Moderate Sedation Time  22 minutes.  FLUOROSCOPY TIME:  1 minutes 6 seconds  37.8 mGy  COMPLICATIONS: None immediate  PROCEDURE: Informed written consent was obtained from the patient after a discussion of the risks, benefits, and alternatives to treatment. Questions regarding the procedure were encouraged and answered.  The existing right IJ non tunneled hemodialysis catheter was  removed. Hemostasis was attained by gentle manual pressure.  The right neck and chest were prepped with chlorhexidine in a sterile fashion, and a sterile drape was applied covering the operative field. Maximum barrier sterile technique with sterile gowns and gloves were used for the procedure. A timeout was performed prior to the initiation of the procedure.  After creating a small venotomy incision, a micropuncture kit was utilized to access the right internal jugular vein under direct, real-time ultrasound guidance after the overlying soft tissues were anesthetized with 1% lidocaine with epinephrine. Ultrasound image documentation was performed. The microwire was kinked to measure appropriate catheter length. A stiff glidewire was advanced to the level of the IVC and the micropuncture sheath was exchanged for a peel-away sheath. A Palindrome tunneled hemodialysis catheter measuring 23 cm from tip to cuff was tunneled in a retrograde fashion from the anterior chest wall to the venotomy incision.  The catheter was then placed through the peel-away sheath with tips ultimately positioned within the mid aspect of the right atrium. Final catheter positioning was confirmed and documented with a spot radiographic image. The catheter aspirates and flushes normally. The catheter was flushed with appropriate volume heparin dwells.  The catheter exit site was secured with a 0-Prolene retention suture. The venotomy incision was closed with an interrupted 4-0 Vicryl, and Dermabond. Dressings were applied. The patient tolerated the procedure well without immediate post procedural complication.  IMPRESSION: Successful placement of 23 cm tip to cuff tunneled hemodialysis catheter via the right internal jugular vein with tips terminating within the superior aspect of the right atrium. The catheter is ready for immediate use.  Signed,  Criselda Peaches, MD  [facility]  [facility]    Electronically Signed   By: Jacqulynn Cadet M.D.   On: 09/13/2014 11:32   Ir US Guide Vasc Access Right  09/13/2014   INDICATION: 64 year old male currently undergoing dialysis via a right neck temporary catheter. Conversion to a tunneled hemodialysis catheter is warranted.  EXAM: TUNNELED CENTRAL VENOUS HEMODIALYSIS CATHETER PLACEMENT WITH ULTRASOUND AND FLUOROSCOPIC GUIDANCE  MEDICATIONS: Ancef 2 gm IV; The IV antibiotic was given in an appropriate time interval prior to skin puncture.  CONTRAST:  None  ANESTHESIA/SEDATION: Versed 1.5 mg IV; Fentanyl 50 mcg IV  Total Moderate Sedation Time  22 minutes.  FLUOROSCOPY TIME:  1 minutes 6 seconds  58.8 mGy  COMPLICATIONS: None immediate  PROCEDURE: Informed written consent was obtained from the patient after a discussion of the risks, benefits, and alternatives to treatment. Questions regarding the procedure were encouraged and answered.  The existing right IJ non tunneled hemodialysis catheter was removed. Hemostasis was attained by gentle manual pressure.  The right neck and chest were prepped with chlorhexidine in a sterile fashion, and a sterile drape was applied covering the operative field. Maximum barrier sterile technique with sterile gowns and gloves were used for the procedure. A timeout was performed prior to the initiation of the procedure.  After creating a small venotomy incision, a micropuncture kit was utilized to access the right internal jugular vein under direct, real-time ultrasound guidance after the overlying soft tissues were anesthetized with 1% lidocaine with epinephrine. Ultrasound image documentation was performed. The microwire was kinked to measure appropriate catheter length. A stiff glidewire was advanced to the level of the IVC and the micropuncture sheath was exchanged for a peel-away sheath. A Palindrome tunneled hemodialysis catheter measuring 23 cm from tip to cuff was tunneled in a retrograde fashion from the anterior chest wall  to the venotomy incision.  The catheter was then placed through the peel-away sheath with tips ultimately positioned within the mid aspect of the right atrium. Final catheter positioning was confirmed and documented with a spot radiographic image. The catheter aspirates and flushes normally. The catheter was flushed with appropriate volume heparin dwells.  The catheter exit site was secured with a 0-Prolene retention suture. The venotomy incision was closed with an interrupted 4-0 Vicryl, and Dermabond. Dressings were applied. The patient tolerated the procedure well without immediate post procedural complication.  IMPRESSION: Successful placement of 23 cm tip to cuff tunneled hemodialysis catheter via the right internal jugular vein with tips terminating within the superior aspect of the right atrium. The catheter is ready for immediate use.  Signed,  Criselda Peaches, MD  [facility]  [facility]   Electronically Signed   By: Jacqulynn Cadet M.D.   On: 09/13/2014 11:32    Scheduled Meds: . sodium chloride   Intravenous Once  . amiodarone  200 mg Oral Once  . amiodarone  200 mg Oral BID  . antiseptic oral rinse  7 mL Mouth Rinse BID  . atorvastatin  40 mg Oral q1800  . bisacodyl  5 mg Oral QHS  . cefTRIAXone (ROCEPHIN)  IV  1 g Intravenous Q24H  . chlorhexidine  15 mL Mouth/Throat BID  . darbepoetin (ARANESP) injection - DIALYSIS  100 mcg Intravenous Q Mon-HD  . DULoxetine  30 mg Oral Daily  . feeding supplement (NEPRO CARB STEADY)  237 mL Oral BID BM  . insulin aspart  0-9 Units Subcutaneous TID AC & HS  . levothyroxine  75 mcg Oral QAC breakfast  . sildenafil  80 mg Oral TID  . sodium chloride irrigation  200 mL Irrigation Q2H while awake   Continuous Infusions: . sodium chloride 10 mL (09/15/14 0419)  . DOBUTamine 2 mcg/kg/min (09/15/14 0420)  . norepinephrine (LEVOPHED) Adult infusion    . sodium chloride irrigation      Principal  Problem:   Fracture, intertrochanteric, left femur Active Problems:   Essential hypertension, benign   Persistent atrial fibrillation   Automatic implantable cardioverter-defibrillator in situ   DM2 (diabetes mellitus, type 2)   OSA (obstructive sleep apnea), intolerant to CPAP/BIPAP   Ventricular tachycardia   Chronic systolic CHF (congestive heart failure)   Hip fracture   Left femoral shaft fracture   Acute on chronic renal failure   Hyponatremia   Acute on chronic systolic CHF (congestive heart failure)   Acute renal failure superimposed on stage  4 chronic kidney disease   SOB (shortness of breath)   Insomnia   Oliguria   Adjustment disorder with anxious mood   Chronic atrial fibrillation   Pressure ulcer   Debility   Time spent: 30 minutes   Samatha Anspach, [facility] Pager [phone]. If 7PM-7AM, please contact night-coverage at [email or web address], password [facility] 09/15/2014, 8:41 AM  LOS: 21 days

## 2014-09-15 NOTE — Progress Notes (Signed)
Subjective:  250 UOP - had HD yest- tolerated hemodynamically OK but very anxious- removed 2 liters.  Right sided abdominal pain this AM- pt writhing- moaning.  When I talk about dialysis- "can I refuse it "?? Also blood pressure is low at 79/30  Objective Vital signs in last 24 hours: Filed Vitals:   09/14/14 2245 09/14/14 2300 09/14/14 2315 09/15/14 0311  BP: 111/39 103/40 107/38 111/45  Pulse: 73 72 72 73  Temp:   98.2 F (36.8 C) 98.6 F (37 C)  TempSrc:   Oral Axillary  Resp: $Remo'24 11 12 17  'HIqVJ$ Height:      Weight:      SpO2: 96% 95% 96% 99%   Weight change: 0.5 kg (1 lb 1.6 oz)  Intake/Output Summary (Last 24 hours) at 09/15/14 0754 Last data filed at 09/15/14 0600  Gross per 24 hour  Intake 1377.35 ml  Output   2250 ml  Net -872.65 ml    Assessment/ Plan: Pt is a 64 y.o. yo male with baseline nonischemic cardiomyopathy on milrinone with stage 3 CKD who was admitted on 08/25/2014 with s/p hip fx- had A on CRF - now HD requiring  Assessment/Plan: 1. Renal- CKD at baseline- with events became dialysis requiring- on CRRT from 7/23- 7/31. Had IHD 8/1 on 8/4 and 8/6. Unfortunately- his kidneys are not improving off of HD- will tentatively plan another IHD treatment tomorrow pending outcome of events today. Vascath converted  to [facility] on 8/5.  As far as long term plans- doing HD on someone on an inotrope successfully  has not been done  as far as I know.  We need to hope that he will either recover enough renal function to stay off of HD (crt in June was 2.4)  Or need to wean the dobutamine in order to do hemodialysis as an OP- cards does not think this would work- also had arrythmia with HD Thursday so unclear if will be stable enough to do HD.   PD could also maybe be an option but will need to tune him up as much as possible before even asking surgery about placing a PD cath.  This is a very difficult situation. Patient seeming to get worse instead of better- talking about wanting to refuse  dialysis- I am not sure we are doing him favors by continuing with aggressive care  2. Anemia- hgb falling with events and heparin- repleting iron and have added aranesp- s/p one unit blood on Friday- is down again- have discussed with Dr. Tyrell Antonio- concern of possible abdominal wall hematoma with dec hgb and abdominal pain to give one unit today maybe will help with BP  3. HTN/volume-   Is very  overloaded-   Can not tell how much fluid was removed with HD 8/4- removed 2 liters yest   Baseline cardiomyopathy is complicating situation now blood pressure is low.  Lasix is not being effective has been stopped- continue with UF as able with HD.  Give blood today to see if will help BP- cards on board.  Will hold heparin until know what is happening with abdomen  4. S/p hip fx- operative repair on 7/19- planning on SNF eventually- cannot be discharged until dialysis dispo is decided - rehab appropriately did not take patient b/c wants decision made regarding dialysis  5. Urine looks cloudy- - looked like UTI- rocephin given - have d/cd foley  6. Abdominal pain- with fullness on right side  Chenee Munns A    Labs:  Basic Metabolic Panel:  Recent Labs Lab 09/10/14 0525 09/11/14 0452 09/12/14 0630 09/13/14 0530 09/14/14 0325 09/15/14 0306  NA 134* 131* 125* 131* 127* 132*  K 4.4 4.3 4.8 4.0 3.9 4.3  CL 97* 96* 90* 93* 90* 96*  CO2 $Re'28 27 24 27 24 26  'JaO$ GLUCOSE 99 126* 129* 85 133* 120*  BUN 18 31* 45* 24* 37* 25*  CREATININE 2.85* 3.94* 4.87* 3.47* 4.00* 2.66*  CALCIUM 8.3* 8.6* 8.6* 8.2* 8.3* 8.0*  PHOS 4.1 5.2* 6.0*  --   --   --    Liver Function Tests:  Recent Labs Lab 09/10/14 0525 09/11/14 0452 09/12/14 0630  ALBUMIN 2.4* 2.4* 2.5*   No results for input(s): LIPASE, AMYLASE in the last 168 hours. No results for input(s): AMMONIA in the last 168 hours. CBC:  Recent Labs Lab 09/11/14 0452 09/12/14 0630 09/13/14 0530 09/14/14 1552 09/15/14 0306  WBC 9.2 8.4 8.0 9.1  9.9  HGB 8.1* 8.4* 7.6* 8.8* 7.6*  HCT 26.1* 27.0* 24.0* 27.6* 23.7*  MCV 97.4 96.8 96.0 94.8 94.8  PLT 202 229 201 215 196   Cardiac Enzymes: No results for input(s): CKTOTAL, CKMB, CKMBINDEX, TROPONINI in the last 168 hours. CBG:  Recent Labs Lab 09/13/14 1655 09/13/14 2102 09/14/14 0822 09/14/14 1108 09/14/14 2231  GLUCAP 104* 115* 120* 116* 108*    Iron Studies:  No results for input(s): IRON, TIBC, TRANSFERRIN, FERRITIN in the last 72 hours. Studies/Results: Ir Fluoro Guide Cv Line Right  09/13/2014   INDICATION: 64 year old male currently undergoing dialysis via a right neck temporary catheter. Conversion to a tunneled hemodialysis catheter is warranted.  EXAM: TUNNELED CENTRAL VENOUS HEMODIALYSIS CATHETER PLACEMENT WITH ULTRASOUND AND FLUOROSCOPIC GUIDANCE  MEDICATIONS: Ancef 2 gm IV; The IV antibiotic was given in an appropriate time interval prior to skin puncture.  CONTRAST:  None  ANESTHESIA/SEDATION: Versed 1.5 mg IV; Fentanyl 50 mcg IV  Total Moderate Sedation Time  22 minutes.  FLUOROSCOPY TIME:  1 minutes 6 seconds  18.2 mGy  COMPLICATIONS: None immediate  PROCEDURE: Informed written consent was obtained from the patient after a discussion of the risks, benefits, and alternatives to treatment. Questions regarding the procedure were encouraged and answered.  The existing right IJ non tunneled hemodialysis catheter was removed. Hemostasis was attained by gentle manual pressure.  The right neck and chest were prepped with chlorhexidine in a sterile fashion, and a sterile drape was applied covering the operative field. Maximum barrier sterile technique with sterile gowns and gloves were used for the procedure. A timeout was performed prior to the initiation of the procedure.  After creating a small venotomy incision, a micropuncture kit was utilized to access the right internal jugular vein under direct, real-time ultrasound guidance after the overlying soft tissues were  anesthetized with 1% lidocaine with epinephrine. Ultrasound image documentation was performed. The microwire was kinked to measure appropriate catheter length. A stiff glidewire was advanced to the level of the IVC and the micropuncture sheath was exchanged for a peel-away sheath. A Palindrome tunneled hemodialysis catheter measuring 23 cm from tip to cuff was tunneled in a retrograde fashion from the anterior chest wall to the venotomy incision.  The catheter was then placed through the peel-away sheath with tips ultimately positioned within the mid aspect of the right atrium. Final catheter positioning was confirmed and documented with a spot radiographic image. The catheter aspirates and flushes normally. The catheter was flushed with appropriate volume heparin dwells.  The catheter exit site was  secured with a 0-Prolene retention suture. The venotomy incision was closed with an interrupted 4-0 Vicryl, and Dermabond. Dressings were applied. The patient tolerated the procedure well without immediate post procedural complication.  IMPRESSION: Successful placement of 23 cm tip to cuff tunneled hemodialysis catheter via the right internal jugular vein with tips terminating within the superior aspect of the right atrium. The catheter is ready for immediate use.  Signed,  Criselda Peaches, MD  [facility]  [facility]   Electronically Signed   By: Jacqulynn Cadet M.D.   On: 09/13/2014 11:32   Ir US Guide Vasc Access Right  09/13/2014   INDICATION: 64 year old male currently undergoing dialysis via a right neck temporary catheter. Conversion to a tunneled hemodialysis catheter is warranted.  EXAM: TUNNELED CENTRAL VENOUS HEMODIALYSIS CATHETER PLACEMENT WITH ULTRASOUND AND FLUOROSCOPIC GUIDANCE  MEDICATIONS: Ancef 2 gm IV; The IV antibiotic was given in an appropriate time interval prior to skin puncture.  CONTRAST:  None  ANESTHESIA/SEDATION: Versed 1.5 mg IV;  Fentanyl 50 mcg IV  Total Moderate Sedation Time  22 minutes.  FLUOROSCOPY TIME:  1 minutes 6 seconds  40.9 mGy  COMPLICATIONS: None immediate  PROCEDURE: Informed written consent was obtained from the patient after a discussion of the risks, benefits, and alternatives to treatment. Questions regarding the procedure were encouraged and answered.  The existing right IJ non tunneled hemodialysis catheter was removed. Hemostasis was attained by gentle manual pressure.  The right neck and chest were prepped with chlorhexidine in a sterile fashion, and a sterile drape was applied covering the operative field. Maximum barrier sterile technique with sterile gowns and gloves were used for the procedure. A timeout was performed prior to the initiation of the procedure.  After creating a small venotomy incision, a micropuncture kit was utilized to access the right internal jugular vein under direct, real-time ultrasound guidance after the overlying soft tissues were anesthetized with 1% lidocaine with epinephrine. Ultrasound image documentation was performed. The microwire was kinked to measure appropriate catheter length. A stiff glidewire was advanced to the level of the IVC and the micropuncture sheath was exchanged for a peel-away sheath. A Palindrome tunneled hemodialysis catheter measuring 23 cm from tip to cuff was tunneled in a retrograde fashion from the anterior chest wall to the venotomy incision.  The catheter was then placed through the peel-away sheath with tips ultimately positioned within the mid aspect of the right atrium. Final catheter positioning was confirmed and documented with a spot radiographic image. The catheter aspirates and flushes normally. The catheter was flushed with appropriate volume heparin dwells.  The catheter exit site was secured with a 0-Prolene retention suture. The venotomy incision was closed with an interrupted 4-0 Vicryl, and Dermabond. Dressings were applied. The patient  tolerated the procedure well without immediate post procedural complication.  IMPRESSION: Successful placement of 23 cm tip to cuff tunneled hemodialysis catheter via the right internal jugular vein with tips terminating within the superior aspect of the right atrium. The catheter is ready for immediate use.  Signed,  Criselda Peaches, MD  [facility]  [facility]   Electronically Signed   By: Jacqulynn Cadet M.D.   On: 09/13/2014 11:32   Medications: Infusions: . sodium chloride 10 mL (09/15/14 0419)  . DOBUTamine 2 mcg/kg/min (09/15/14 0420)  . heparin 1,850 Units/hr (09/14/14 2102)  . sodium chloride irrigation      Scheduled Medications: . amiodarone  200 mg Oral Once  .  amiodarone  200 mg Oral BID  . antiseptic oral rinse  7 mL Mouth Rinse BID  . atorvastatin  40 mg Oral q1800  . bisacodyl  5 mg Oral QHS  . cefTRIAXone (ROCEPHIN)  IV  1 g Intravenous Q24H  . chlorhexidine  15 mL Mouth/Throat BID  . darbepoetin (ARANESP) injection - DIALYSIS  100 mcg Intravenous Q Mon-HD  . DULoxetine  30 mg Oral Daily  . feeding supplement (NEPRO CARB STEADY)  237 mL Oral BID BM  . insulin aspart  0-9 Units Subcutaneous TID AC & HS  . levothyroxine  75 mcg Oral QAC breakfast  . sildenafil  80 mg Oral TID  . sodium chloride irrigation  200 mL Irrigation Q2H while awake    have reviewed scheduled and prn medications.  Physical Exam: General: obese- anxious- moaning Heart: RRR Lungs: decreased BS at bases Abdomen: distended- distinct fullness on right side which is very tender to touch Extremities: pitting edema to dependent areas Dialysis Access: right IJ vascath placed 7/23 - converted to PC on 8/5   09/15/2014,7:54 AM  LOS: 21 days

## 2014-09-15 NOTE — Progress Notes (Signed)
Still complaining of pain on  right lower quad of the abdomen. Noted to be swollen and hard to touch. bowel sound hypoactive.  MD aware. Heparin gtt stopped. . bp dropped to 76'J systolic.  Dobutamine increased to 2.5 mcg, given ns bolus  250 ml.  Started on 1 unit of prbc. Infusing well. Seen by cardilologist. NS 1L given, with cvp reading of 1.pt is awake and oriented. Continue to monitor.

## 2014-09-15 NOTE — Progress Notes (Signed)
Transferred to CICU room 7 by bed, report given to RN.

## 2014-09-15 NOTE — Progress Notes (Signed)
CRITICAL VALUE ALERT  Critical value received:  Glucose >600 venous, 195 capillary    Date of notification:  09/15/2014   Time of notification:  5027  Critical value read back:Yes.    Nurse who received alert:  Rosebud Poles RN  MD notified (1st page):  Pierre Bali  Time of first page:  1710  Responding MD:  Pierre Bali  Time MD responded:  [phone]

## 2014-09-15 NOTE — Progress Notes (Signed)
CRITICAL VALUE ALERT  Critical value received:  Hgb 6.0  Date of notification:  09/15/14  Time of notification:  1418  Critical value read back:Yes.    Nurse who received alert:  Rosebud Poles RN  MD notified (1st page):  Pierre Bali  Time of first page:  1282  Time MD responded:  1420

## 2014-09-16 DIAGNOSIS — K661 Hemoperitoneum: Secondary | ICD-10-CM

## 2014-09-16 DIAGNOSIS — A419 Sepsis, unspecified organism: Secondary | ICD-10-CM

## 2014-09-16 DIAGNOSIS — J189 Pneumonia, unspecified organism: Secondary | ICD-10-CM

## 2014-09-16 DIAGNOSIS — R6521 Severe sepsis with septic shock: Secondary | ICD-10-CM

## 2014-09-16 DIAGNOSIS — S36892A Contusion of other intra-abdominal organs, initial encounter: Secondary | ICD-10-CM

## 2014-09-16 LAB — PREPARE FRESH FROZEN PLASMA
UNIT DIVISION: 0
Unit division: 0

## 2014-09-16 LAB — TYPE AND SCREEN
ABO/RH(D): A POS
Antibody Screen: NEGATIVE
UNIT DIVISION: 0
UNIT DIVISION: 0
UNIT DIVISION: 0
Unit division: 0
Unit division: 0
Unit division: 0
Unit division: 0
Unit division: 0

## 2014-09-16 LAB — BASIC METABOLIC PANEL
Anion gap: 13 (ref 5–15)
BUN: 40 mg/dL — AB (ref 6–20)
CALCIUM: 7.8 mg/dL — AB (ref 8.9–10.3)
CO2: 22 mmol/L (ref 22–32)
Chloride: 93 mmol/L — ABNORMAL LOW (ref 101–111)
Creatinine, Ser: 3.68 mg/dL — ABNORMAL HIGH (ref 0.61–1.24)
GFR calc Af Amer: 19 mL/min — ABNORMAL LOW (ref >60)
GFR, EST NON AFRICAN AMERICAN: 16 mL/min — AB (ref >60)
Glucose, Bld: 204 mg/dL — ABNORMAL HIGH (ref 65–99)
Potassium: 4.5 mmol/L (ref 3.5–5.1)
Sodium: 128 mmol/L — ABNORMAL LOW (ref 135–145)

## 2014-09-16 LAB — GLUCOSE, CAPILLARY
GLUCOSE-CAPILLARY: 191 mg/dL — AB (ref 65–99)
GLUCOSE-CAPILLARY: 200 mg/dL — AB (ref 65–99)
GLUCOSE-CAPILLARY: 130 mg/dL — AB (ref 65–99)
Glucose-Capillary: >600 mg/dL (ref 65–99)

## 2014-09-16 LAB — CARBOXYHEMOGLOBIN
Carboxyhemoglobin: 2.5 % — ABNORMAL HIGH (ref 0.5–1.5)
Methemoglobin: 1 % (ref 0.0–1.5)
O2 SAT: 75.7 %
Total hemoglobin: 8.3 g/dL — ABNORMAL LOW (ref 13.5–18.0)

## 2014-09-16 LAB — HEMOGLOBIN AND HEMATOCRIT, BLOOD
HCT: 30.5 % — ABNORMAL LOW (ref 39.0–52.0)
HEMATOCRIT: 27.3 % — AB (ref 39.0–52.0)
HEMOGLOBIN: 9.4 g/dL — AB (ref 13.0–17.0)
Hemoglobin: 10.6 g/dL — ABNORMAL LOW (ref 13.0–17.0)

## 2014-09-16 LAB — CBC
HCT: 32 % — ABNORMAL LOW (ref 39.0–52.0)
Hemoglobin: 11.1 g/dL — ABNORMAL LOW (ref 13.0–17.0)
MCH: 30.2 pg (ref 26.0–34.0)
MCHC: 34.7 g/dL (ref 30.0–36.0)
MCV: 87.2 fL (ref 78.0–100.0)
PLATELETS: 151 10*3/uL (ref 150–400)
RBC: 3.67 MIL/uL — ABNORMAL LOW (ref 4.22–5.81)
RDW: 15.7 % — ABNORMAL HIGH (ref 11.5–15.5)
WBC: 17.5 10*3/uL — ABNORMAL HIGH (ref 4.0–10.5)

## 2014-09-16 LAB — PROTIME-INR
INR: 1.36 (ref 0.00–1.49)
PROTHROMBIN TIME: 16.9 s — AB (ref 11.6–15.2)

## 2014-09-16 MED ORDER — DARBEPOETIN ALFA 100 MCG/0.5ML IJ SOSY
PREFILLED_SYRINGE | INTRAMUSCULAR | Status: AC
  Administered 2014-09-16: 100 ug via INTRAVENOUS
  Filled 2014-09-16: qty 0.5

## 2014-09-16 MED ORDER — ALTEPLASE 100 MG IV SOLR
2.0000 mg | Freq: Once | INTRAVENOUS | Status: AC
  Administered 2014-09-16: 2 mg
  Filled 2014-09-16: qty 2

## 2014-09-16 MED ORDER — ALTEPLASE 2 MG IJ SOLR
2.0000 mg | Freq: Once | INTRAMUSCULAR | Status: AC
  Filled 2014-09-16 (×2): qty 2

## 2014-09-16 MED ORDER — RENA-VITE PO TABS
1.0000 | ORAL_TABLET | Freq: Every day | ORAL | Status: DC
  Administered 2014-09-16 – 2014-10-10 (×25): 1 via ORAL
  Filled 2014-09-16 (×27): qty 1

## 2014-09-16 NOTE — Progress Notes (Signed)
Patient ID: Larry [name], male   DOB: [birth year], 64 y.o.   MRN: [id number] Advanced Heart Failure Rounding Note   Subjective:    Larry [name] is a 64 y/o man with history of chronic systolic CHF (nonischemic cardiomyopathy) s/p 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 He is on chronic milrinone 0.5 mcg.  Prior to admit he was being considered for LVAD.   Dischaged 7/16 after dental procedures. Also had RHC with elevate pulmonary pressures. Revatio was increased to 80 mg tid during that stay. Admitted with fall---> broken L  hip . 7/18 had IM Nail L femur.  7/23 started CVVHD, CVVH stopped 7/31.  8/6 began having severe right ab/flank pain and was found to have large rectus sheath hematoma 8/7 with transfer to ICU.  Says pain feels better today but still present.  Feels like swelling of hematoma has gone down. Remains on levophed 30.  SBPs in 120s. Hemoglobin 11.1 this morning with 7 Units of RBC and 2 units of FFP since 8/5. Off Heparin.  Weight still down overall despite blood transfusions and IV fluid. CVP 7   Objective:   Weight Range:  Vital Signs:   Temp:  [97.3 F (36.3 C)-98.5 F (36.9 C)] 98 F (36.7 C) (08/08 0400) Pulse Rate:  [39-105] 79 (08/08 0600) Resp:  [10-25] 13 (08/08 0600) BP: (59-135)/(24-91) 124/40 mmHg (08/08 0600) SpO2:  [61 %-100 %] 100 % (08/08 0600) Arterial Line BP: (102-120)/(41-71) 107/43 mmHg (08/08 0600) Last BM Date: 09/13/14  Weight change: Filed Weights   09/14/14 0300 09/14/14 1550 09/14/14 2011  Weight: 230 lb 13.2 oz (104.7 kg) 231 lb 14.8 oz (105.2 kg) 228 lb 6.3 oz (103.6 kg)    Intake/Output:   Intake/Output Summary (Last 24 hours) at 09/16/14 0729 Last data filed at 09/16/14 0700  Gross per 24 hour  Intake 4019.57 ml  Output      0 ml  Net 4019.57 ml     Physical Exam: CVP 7 General:   lying in bed NAD HEENT: normal Neck: supple. JVP 7.  Tunneled HD catheter.  Carotids 2+ bilat; no bruits. No lymphadenopathy or thryomegaly appreciated. Cor: PMI nondisplaced. Regular rate & rhythm. No rubs, gallops or murmurs. Lungs: clear Abdomen: soft, nondistended. Large hematoma over right abdominal wall. Tender to palpation. No rebound. + bowel sounds. Extremities: no cyanosis, clubbing, rash. Trace- 1 + peripheral edema Neuro: Alert/oriented in pain  Telemetry: NSR 70-80s occasional V pacing.  High RR alarms  Labs: Basic Metabolic Panel:  Recent Labs Lab 09/10/14 0525 09/11/14 0452 09/12/14 0630 09/13/14 0530 09/14/14 0325 09/15/14 0306 09/15/14 2243 09/16/14 0440  NA 134* 131* 125* 131* 127* 132* 127* 128*  K 4.4 4.3 4.8 4.0 3.9 4.3 4.5 4.5  CL 97* 96* 90* 93* 90* 96* 94* 93*  CO2 28 27 24 27 24 26 23 22   GLUCOSE 99 126* 129* 85 133* 120* 215* 204*  BUN 18 31* 45* 24* 37* 25* 35* 40*  CREATININE 2.85* 3.94* 4.87* 3.47* 4.00* 2.66* 3.48* 3.68*  CALCIUM 8.3* 8.6* 8.6* 8.2* 8.3* 8.0* 7.6* 7.8*  MG 2.2 2.3 2.2  --   --  2.0  --   --   PHOS 4.1 5.2* 6.0*  --   --   --   --   --     Liver Function Tests:  Recent Labs Lab 09/10/14 0525 09/11/14 0452 09/12/14 0630  ALBUMIN 2.4*  2.4* 2.5*   No results for input(s): LIPASE, AMYLASE in the last 168 hours. No results for input(s): AMMONIA in the last 168 hours.  CBC:  Recent Labs Lab 09/15/14 0306 09/15/14 1330 09/15/14 1700 09/15/14 2110 09/15/14 2243 09/16/14 0440  WBC 9.9 11.4* 10.6* 14.1*  --  17.5*  HGB 7.6* 6.0* 7.2* 10.9* 11.2* 11.1*  HCT 23.7* 18.0* 22.1* 31.9* 31.9* 32.0*  MCV 94.8 91.8 94.4 88.4  --  87.2  PLT 196 126* 109* 131*  --  151    Cardiac Enzymes: No results for input(s): CKTOTAL, CKMB, CKMBINDEX, TROPONINI in the last 168 hours.  BNP: BNP (last 3 results)  Recent Labs  05/22/14 1029 05/27/14 1126 06/19/14 1030  BNP 935.2* 1627.8* 738.9*    ProBNP (last 3 results)  Recent Labs  10/09/13 0926 11/14/13 0926 01/14/14 1112   PROBNP 1561.0* 1261.0* 5769.0*      Other results:  Imaging: Ct Abdomen Pelvis Wo Contrast  09/15/2014   CLINICAL DATA:  Acute onset of severe abdominal pain. Initial encounter.  EXAM: CT ABDOMEN AND PELVIS WITHOUT CONTRAST  TECHNIQUE: Multidetector CT imaging of the abdomen and pelvis was performed following the standard protocol without IV contrast.  COMPARISON:  CT of the abdomen and pelvis performed 05/27/2014, and renal ultrasound performed 08/31/2014  FINDINGS: Small bilateral pleural effusions are noted, with bibasilar atelectasis and scattered calcifications likely reflecting calcified granulomata. Diffuse coronary artery calcifications are seen. A pacemaker lead is partially imaged.  There is a very large right retroperitoneal hematoma, measuring 24.2 x 16.9 x 19.0 cm, containing a combination of acute and chronic hemorrhage. This appears partially contiguous with additional acute blood tracking superiorly along the retroperitoneum, and blood tracking about the IVC and the anterior aspect of Gerota's fascia. Trace blood tracks about the duodenum, which is displaced anteriorly by the large collection.  There appears to be some degree of hemorrhage within the right psoas muscle more inferiorly, with a somewhat unusual 4.3 x 3.3 x 2.6 cm low attenuation collection within the right psoas. This is not well assessed without contrast, but raises concern for underlying abscess within the right psoas. Alternatively, it could reflect unusual accumulation of chronic blood within the psoas.  There is also a small amount of intraperitoneal hemorrhage tracking along the the paracolic gutters bilaterally into the pelvis. A small amount of blood is seen tracking about the liver and spleen, and a small amount of lower attenuation fluid is seen tracking into a small umbilical hernia.  The liver and spleen are otherwise unremarkable in appearance. The gallbladder is distended but likely within normal limits. The  pancreas and adrenal glands are unremarkable.  The very large retroperitoneal hematoma displaces the right kidney anteriorly. Nonspecific perinephric stranding is noted bilaterally. There is no evidence of hydronephrosis. No renal or ureteral stones are seen.  No free fluid is identified. The small bowel is unremarkable in appearance. The stomach is within normal limits. No acute vascular abnormalities are seen. Scattered calcification is noted along the abdominal aorta and its branches.  The appendix is normal in caliber, without evidence of appendicitis. Scattered diverticulosis is noted along the descending and sigmoid colon, without evidence of diverticulitis.  The bladder is mildly distended. A small amount of air within the bladder is of uncertain significance. Would correlate for recent Foley catheter placement. The prostate is borderline normal in size. No inguinal lymphadenopathy is seen.  No acute osseous abnormalities are identified. The patient's left hip hardware is grossly unremarkable, though incompletely  imaged. A residual underlying left femoral intertrochanteric fracture line is partially imaged.  IMPRESSION: 1. Very large right retroperitoneal hematoma, measuring 24.2 x 16.9 x 19.0 cm, containing a combination of acute and chronic hemorrhage. This appears partially contiguous with additional acute blood tracking superiorly along the retroperitoneum, and blood tracking about the IVC and the anterior aspect of Gerota's fascia. Trace blood tracks about the duodenum. This likely reflects persistent hemorrhage status post the patient's prior cardiac catheterization on 08/21/2014. It may reflect some degree of injury to the IVC, given the presence of blood about the IVC and within the perineum. 2. Small amount of intraperitoneal hemorrhage tracks along the paracolic gutters bilaterally into the pelvis. Small amount of blood seen tracking about the liver and spleen. Small amount of lower attenuation  fluid may reflect chronic blood, tracking into a small umbilical hernia. 3. Some degree of hemorrhage within the right psoas muscle more inferiorly. Associated unusual 4.3 x 3.3 x 2.6 cm low attenuation collection within the right psoas muscle. This is not well assessed without contrast, but raises concern for underlying abscess within the right psoas. Alternately, it could reflect unusual accumulation of chronic blood within the psoas. 4. Small bilateral pleural effusions, with bibasilar atelectasis and scattered associated calcifications likely reflecting calcified granulomata. 5. Anterior displacement of the right kidney and duodenum by the very large hematoma. 6. Small amount of air noted within the bladder, of uncertain significance. Would correlate for recent Foley catheter placement. 7. Scattered diverticulosis along the descending and sigmoid colon, without evidence of diverticulitis. 8. Scattered calcification along the abdominal aorta and its branches. 9. Diffuse coronary artery calcifications seen.  Critical Value/emergent results were called by telephone at the time of interpretation on 09/15/2014 at 9:36 pm to the [facility] physician on call, who verbally acknowledged these results.   Electronically Signed   By: Garald Balding M.D.   On: 09/15/2014 21:48     Medications:     Scheduled Medications: . amiodarone  200 mg Oral Once  . amiodarone  200 mg Oral BID  . antiseptic oral rinse  7 mL Mouth Rinse BID  . atorvastatin  40 mg Oral q1800  . bisacodyl  5 mg Oral QHS  . cefTRIAXone (ROCEPHIN)  IV  1 g Intravenous Q24H  . chlorhexidine  15 mL Mouth/Throat BID  . darbepoetin (ARANESP) injection - DIALYSIS  100 mcg Intravenous Q Mon-HD  . DULoxetine  30 mg Oral Daily  . feeding supplement (NEPRO CARB STEADY)  237 mL Oral BID BM  . insulin aspart  0-9 Units Subcutaneous TID AC & HS  . levothyroxine  75 mcg Oral QAC breakfast  . sildenafil  80 mg Oral TID  . sodium chloride irrigation  200 mL  Irrigation Q2H while awake    Infusions: . sodium chloride Stopped (09/15/14 1600)  . DOBUTamine 2.5 mcg/kg/min (09/15/14 1136)  . norepinephrine (LEVOPHED) Adult infusion 32 mcg/min (09/16/14 0500)  . sodium chloride irrigation      PRN Medications: Place/Maintain arterial line **AND** sodium chloride, albuterol, ALPRAZolam, fentaNYL (SUBLIMAZE) injection, ondansetron (ZOFRAN) IV, oxyCODONE-acetaminophen **AND** oxyCODONE, sodium chloride, temazepam   Assessment:  1. Left Hip Fracture s/p IM nail on 08/26/14 2. Chronic systolic CHF- Nonischemic cardiomyopathy, on home milrinone 0.5 mcg/kg/min, decreased to 0.25 here with NSVT. Became hypotensive, now on dobutamine 2.5.  3. Acute on chronic renal failure - suspect post-op ATN.  CVVH => HD.  4.  Paroxysmal atrial fibrillation requiring DCCV 02/2014, 04/2014- on chronic amio and xarelto prior  to admission. CHADS VASc Score- 5.  Remains in NSR.  5. Diabetes mellitus type 2 6. Depression/anxiety 7. OSA, complex- Needs Bipap at night but had difficulty after dental procedure.  8 Pulmonary HTN - Mixed pulmonary venous hypertension and PAH- on Revatio 80 mg tid 9. Dental extractions 08/2014 by Dr Enrique Sack 10. Obesity- Body mass index is 32.39 kg/(m^2). 11. Probable chronic respiratory failure - requiring home O2 with ambulation as of this admission 12. Hyponatremia   13. NSVT 14. Delirium- Improved.  15. UTI- ceftriaxone 16. Anemia- renal disease/chronic disease.  Transfused 1 unit on 8/5.  17. Hemorrhagic shock with large rectus sheath hematoma 8/6  Plan/Discussion:    He has a large rectus sheath hematoma with hemorrhagic shock. CVP 6-7. Off Heparin. INR 1.36 (last dose of coumadin 8/2).   Will continue to monitor fluid status closely.    PD possibly a long-term option but is a complicated and lengthy process; can take weeks to months to arrange. Once improved can try to wean dobutamine as tolerated.  MD had talk with pt and family  concerning goals of care with pt requiring inotropes and HD. Currents goals of care: Aggressive care and Full code per family discussion 09/15/14  MD has discussed with [facility] and Renal. Will attend while in ICU with understanding that [facility] will resume care on transfer out of ICU.   Length of Stay: [address] [place], [place] 09/16/2014 7:36 AM   Advanced Heart Failure Team Pager [phone] (M-F; 7a - 4p)  Please contact [facility] for night-coverage after hours (4p -7a ) and weekends on [email or web address]  Patient seen and examined with Oda Kilts, PA-C. We discussed all aspects of the encounter. I agree with the assessment and plan as stated above.   Much improved today. Review of CT shows massive retroperitoneal hematoma with mild intra-peritoneal component. There is some suggestion that this may have been related to vascular injury but given timing of bleed seems more spontaneous. Will as VVS to review images and see if they feel there is anything to do. Continue to follow Hgb closely. We will wean levophed as tolerated to keep SBP > 95. Would not restart dobutamine unless needed to support HD. Hold all anticoagulants. Will await Renal input on timing of HD.   The patient is critically ill with multiple organ systems failure and requires high complexity decision making for assessment and support, frequent evaluation and titration of therapies, application of advanced monitoring technologies and extensive interpretation of multiple databases.   Critical Care Time devoted to patient care services described in this note is 35 Minutes.  Alieah Brinton,MD 8:37 AM

## 2014-09-16 NOTE — Progress Notes (Signed)
Hemodialysis-Pt tolerated well without issue. UF=2L per order. Report give to primary RN. BP stable 121/35, HR 81, 100% 2L [place].

## 2014-09-16 NOTE — Progress Notes (Signed)
S: Co Pain Rt side from hematoma O:BP 95/42 mmHg  Pulse 76  Temp(Src) 97.6 F (36.4 C) (Oral)  Resp 15  Ht 5\' 10"  (1.778 m)  Wt 103.6 kg (228 lb 6.3 oz)  BMI 32.77 kg/m2  SpO2 100%  Intake/Output Summary (Last 24 hours) at 09/16/14 0847 Last data filed at 09/16/14 0700  Gross per 24 hour  Intake 4015.77 ml  Output      0 ml  Net 4015.77 ml   Weight change:  Gen: Awake and alert CVS: RRR Resp: clear ant Abd: + BS, large palpable hematoma rt abd Ext: + edema NEURO: CNI Ox3 no asterixis Rt IJ permcath   . amiodarone  200 mg Oral Once  . amiodarone  200 mg Oral BID  . antiseptic oral rinse  7 mL Mouth Rinse BID  . atorvastatin  40 mg Oral q1800  . bisacodyl  5 mg Oral QHS  . cefTRIAXone (ROCEPHIN)  IV  1 g Intravenous Q24H  . chlorhexidine  15 mL Mouth/Throat BID  . darbepoetin (ARANESP) injection - DIALYSIS  100 mcg Intravenous Q Mon-HD  . DULoxetine  30 mg Oral Daily  . feeding supplement (NEPRO CARB STEADY)  237 mL Oral BID BM  . insulin aspart  0-9 Units Subcutaneous TID AC & HS  . levothyroxine  75 mcg Oral QAC breakfast  . sildenafil  80 mg Oral TID  . sodium chloride irrigation  200 mL Irrigation Q2H while awake   Ct Abdomen Pelvis Wo Contrast  09/15/2014   CLINICAL DATA:  Acute onset of severe abdominal pain. Initial encounter.  EXAM: CT ABDOMEN AND PELVIS WITHOUT CONTRAST  TECHNIQUE: Multidetector CT imaging of the abdomen and pelvis was performed following the standard protocol without IV contrast.  COMPARISON:  CT of the abdomen and pelvis performed 05/27/2014, and renal ultrasound performed 08/31/2014  FINDINGS: Small bilateral pleural effusions are noted, with bibasilar atelectasis and scattered calcifications likely reflecting calcified granulomata. Diffuse coronary artery calcifications are seen. A pacemaker lead is partially imaged.  There is a very large right retroperitoneal hematoma, measuring 24.2 x 16.9 x 19.0 cm, containing a combination of acute and  chronic hemorrhage. This appears partially contiguous with additional acute blood tracking superiorly along the retroperitoneum, and blood tracking about the IVC and the anterior aspect of Gerota's fascia. Trace blood tracks about the duodenum, which is displaced anteriorly by the large collection.  There appears to be some degree of hemorrhage within the right psoas muscle more inferiorly, with a somewhat unusual 4.3 x 3.3 x 2.6 cm low attenuation collection within the right psoas. This is not well assessed without contrast, but raises concern for underlying abscess within the right psoas. Alternatively, it could reflect unusual accumulation of chronic blood within the psoas.  There is also a small amount of intraperitoneal hemorrhage tracking along the the paracolic gutters bilaterally into the pelvis. A small amount of blood is seen tracking about the liver and spleen, and a small amount of lower attenuation fluid is seen tracking into a small umbilical hernia.  The liver and spleen are otherwise unremarkable in appearance. The gallbladder is distended but likely within normal limits. The pancreas and adrenal glands are unremarkable.  The very large retroperitoneal hematoma displaces the right kidney anteriorly. Nonspecific perinephric stranding is noted bilaterally. There is no evidence of hydronephrosis. No renal or ureteral stones are seen.  No free fluid is identified. The small bowel is unremarkable in appearance. The stomach is within normal limits. No acute  vascular abnormalities are seen. Scattered calcification is noted along the abdominal aorta and its branches.  The appendix is normal in caliber, without evidence of appendicitis. Scattered diverticulosis is noted along the descending and sigmoid colon, without evidence of diverticulitis.  The bladder is mildly distended. A small amount of air within the bladder is of uncertain significance. Would correlate for recent Foley catheter placement. The  prostate is borderline normal in size. No inguinal lymphadenopathy is seen.  No acute osseous abnormalities are identified. The patient's left hip hardware is grossly unremarkable, though incompletely imaged. A residual underlying left femoral intertrochanteric fracture line is partially imaged.  IMPRESSION: 1. Very large right retroperitoneal hematoma, measuring 24.2 x 16.9 x 19.0 cm, containing a combination of acute and chronic hemorrhage. This appears partially contiguous with additional acute blood tracking superiorly along the retroperitoneum, and blood tracking about the IVC and the anterior aspect of Gerota's fascia. Trace blood tracks about the duodenum. This likely reflects persistent hemorrhage status post the patient's prior cardiac catheterization on 08/21/2014. It may reflect some degree of injury to the IVC, given the presence of blood about the IVC and within the perineum. 2. Small amount of intraperitoneal hemorrhage tracks along the paracolic gutters bilaterally into the pelvis. Small amount of blood seen tracking about the liver and spleen. Small amount of lower attenuation fluid may reflect chronic blood, tracking into a small umbilical hernia. 3. Some degree of hemorrhage within the right psoas muscle more inferiorly. Associated unusual 4.3 x 3.3 x 2.6 cm low attenuation collection within the right psoas muscle. This is not well assessed without contrast, but raises concern for underlying abscess within the right psoas. Alternately, it could reflect unusual accumulation of chronic blood within the psoas. 4. Small bilateral pleural effusions, with bibasilar atelectasis and scattered associated calcifications likely reflecting calcified granulomata. 5. Anterior displacement of the right kidney and duodenum by the very large hematoma. 6. Small amount of air noted within the bladder, of uncertain significance. Would correlate for recent Foley catheter placement. 7. Scattered diverticulosis along the  descending and sigmoid colon, without evidence of diverticulitis. 8. Scattered calcification along the abdominal aorta and its branches. 9. Diffuse coronary artery calcifications seen.  Critical Value/emergent results were called by telephone at the time of interpretation on 09/15/2014 at 9:36 pm to the [facility] physician on call, who verbally acknowledged these results.   Electronically Signed   By: Garald Balding M.D.   On: 09/15/2014 21:48   BMET    Component Value Date/Time   NA 128* 09/16/2014 0440   K 4.5 09/16/2014 0440   CL 93* 09/16/2014 0440   CO2 22 09/16/2014 0440   GLUCOSE 204* 09/16/2014 0440   BUN 40* 09/16/2014 0440   CREATININE 3.68* 09/16/2014 0440   CALCIUM 7.8* 09/16/2014 0440   GFRNONAA 16* 09/16/2014 0440   GFRAA 19* 09/16/2014 0440   CBC    Component Value Date/Time   WBC 17.5* 09/16/2014 0440   RBC 3.67* 09/16/2014 0440   HGB 11.1* 09/16/2014 0440   HCT 32.0* 09/16/2014 0440   PLT 151 09/16/2014 0440   MCV 87.2 09/16/2014 0440   MCH 30.2 09/16/2014 0440   MCHC 34.7 09/16/2014 0440   RDW 15.7* 09/16/2014 0440   LYMPHSABS 0.9 08/30/2014 1744   MONOABS 1.0 08/30/2014 1744   EOSABS 0.2 08/30/2014 1744   BASOSABS 0.0 08/30/2014 1744     Assessment:  1. Acute on CKD 3, ? If at ESRD 2. Severe cardiomyopathy 3. Abd wall hematoma 4.  SP Lt hip fx  Plan: 1. Plan HD today in room 2. Start renavite 3. Check PTH   Larry [name] T

## 2014-09-16 NOTE — Consult Note (Signed)
[facility] [place]  Reason for Consult:  Retroperitoneal hematoma Referring Physician:  Dr. Haroldine Laws MRN #:  [id number]  History of Present Illness: This is a 64 y.o. male who was admitted on 08/25/14 after a mechanical ground level fall. He was found to have a left femur fracture and underwent IM nailing by Dr. Fredonia Highland on 08/26/14. He had a cardiac cath 7/13 from right femoral approach.  He has an extensive past medical history including nonischemic cardiomyopathy on milrinone and s/p St Jude ICD, CAD, paroxysmal AF on xarelto, pulmonary hypertension, CVA, OSA, CKD stage III, and type II diabetes. Prior to this admission, he underwent work up for LVAD with right heart catheterization (via right groin) on 08/21/14. He then underwent multiple dental extractions on 08/22/14. He was discharged home on 08/24/14.   His post-operative course was complicated by AKI and he was started on CRRT. His xarelto was restarted on 08/27/14 (POD 1). He had some nausea and vomiting on 08/29/14 and a KUB showed possible ileus. An NG tube was placed. He had significant improvement in his n/v after having a bowel movement on 7/22. His xarelto was discontinued on 08/31/14 due to worsening kidney function and he was placed on IV heparin.   He developed "sudden" right flank pain and abdominal pain on 09/14/14 and a CT abd/pelvis was performed revealing a right retroperitoneal hemorrhage and hemorrhage in psoas muscle.  This was associated with anemia (Hgb 6 on 09/15/14) and he was administered 6 units of pRBCs. He was also hypotensive and started on phenylephrine. His heparin was stopped the morning of 09/15/14.  He has not been on any anticoagulation since that time.  He still complains of right flank and abdominal pain today this worsened with palpation. He feels weak. He denies any chest pressure, chest pain or shortness of breath.    Past Medical History  Diagnosis Date  . NICM (nonischemic  cardiomyopathy)     a. NICM. EF 2011 25-30%. b. LHC 2009 nonobstructive CAD. c. h/o St. Jude ICD 2012. d. Echo 04/2013 - EF 25-30% with mildly decreased RV systolic function. e. Echo 01/2014: EF 20-25%, inf akinesis, RHC. e. Home milrinone started 4/16.  [name] CAD (coronary artery disease)     a. nonobstructive CAD by cath 2009 & 06/2014.  . Obesity   . CVA (cerebral vascular accident)     CVA 2007 without residual deficit  . Pituitary tumor      (nonfunctionging pituitary microadenoma) s/p gamma knife surgery at [facility] 2009 with neuropathy and retinopathy  . Depression   . Erectile dysfunction   . DDD (degenerative disc disease)     Chronic low back pain.   . OSA (obstructive sleep apnea)     a. 4/16 sleep study with severe OSA  . Monoclonal gammopathy   . CKD (chronic kidney disease), stage III   . 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 Hypogonadotropic hypogonadism   . Polycythemia, secondary     improved/resolved  . Hypercholesterolemia   . Back pain     F/B [name]  . Monoclonal gammopathy of unknown significance     per [name]  . Neck pain     F/B [name]  . PAF (paroxysmal atrial fibrillation) 06/04/10    a. On Xarelto. b. DCCV 02/2014, 04/2014 to NSR.  [name] History of diverticulitis of colon   . Type II or unspecified type diabetes mellitus with neurological manifestations, uncontrolled   . Nonproliferative diabetic retinopathy NOS(362.03)   .  Ventricular tachycardia 10/25/13    appropriate ICD shock, VT CL 230-240 msec  . Confusion 02/01/2014  . Hypertension   . AICD (automatic cardioverter/defibrillator) present   . Anxiety   . Peripheral vascular disease     2007  . Hypothyroidism   . PTSD (post-traumatic stress disorder)     a. Anxiety/PTSD from ICD shock  . Lupus anticoagulant positive     a. No h/o DVT. Saw Dr Lindi Adie with hematology.   . Pulmonary hypertension     a. Mixed pulmonary venous hypertension and PAH  . Chronic respiratory failure     a.  As of 08/2014 requiring home O2 with ambulation due to desat.   Past Surgical History  Procedure Laterality Date  . Gamma knife surgery for pituitary tumor    . Tonsillectomy    . Cardiac defibrillator placement  02/19/10    By JA.   . Right heart catheterization N/A 09/17/2013    Procedure: RIGHT HEART CATH;  Surgeon: [name], MD;  Location: [facility] CATH LAB;  Service: Cardiovascular;  Laterality: N/A;  . Cardiac catheterization    . Brain surgery    . Insert / replace / remove pacemaker    . Cardioversion N/A 02/14/2014    Procedure: CARDIOVERSION;  Surgeon: [name], MD;  Location: [place];  Service: Cardiovascular;  Laterality: N/A;  . Cardioversion N/A 05/02/2014    Procedure: CARDIOVERSION;  Surgeon: [name], MD;  Location: [facility] ENDOSCOPY;  Service: Cardiovascular;  Laterality: N/A;  . Right heart catheterization N/A 05/08/2014    Procedure: RIGHT HEART CATH;  Surgeon: [name], MD;  Location: [facility] CATH LAB;  Service: Cardiovascular;  Laterality: N/A;  . Cardiac catheterization N/A 06/26/2014    Procedure: Right/Left Heart Cath And Coronary Angiography;  Surgeon: [name], MD;  Location: [place] CV LAB;  Service: Cardiovascular;  Laterality: N/A;  . Cardiac catheterization N/A 08/21/2014    Procedure: Right Heart Cath;  Surgeon: [name], MD;  Location: [place] CV LAB;  Service: Cardiovascular;  Laterality: N/A;  . Multiple extractions with alveoloplasty N/A 08/22/2014    Procedure: Extraction of toothy #'s 3,5,6,7,9,10,11,12,13,14,20,21,22,23,27,28,29,30 with alveoloplasty and bilateral mandibular tori reductions;  Surgeon: Lenn Cal, DDS;  Location: [place];  Service: Oral Surgery;  Laterality: N/A;  . Cataract extraction    . Femur im nail Left 08/26/2014    Procedure: INTRAMEDULLARY (IM) NAIL FEMORAL;  Surgeon: [name], MD;  Location: [place];  Service: Orthopedics;  Laterality: Left;    Allergies  Allergen Reactions  .  Bydureon [Exenatide] Other (See Comments)    sweating  . Losartan Potassium Other (See Comments)    insomnia    Prior to Admission medications   Medication Sig Start Date End Date Taking? Authorizing Fynlee Rowlands  ALPRAZolam Duanne Moron) 0.5 MG tablet Take 0.5 mg by mouth See admin instructions. Take 1 tablet (0.5 mg) every night, may take an additional tablet two more times during the day as needed for anxiety   Yes Historical Eddye Broxterman, MD  amiodarone (PACERONE) 200 MG tablet Take 1 tablet (200 mg total) by mouth daily. 06/19/14  Yes [name], MD  atorvastatin (LIPITOR) 40 MG tablet TAKE 1 TABLET DAILY 03/18/14  Yes [name], MD  bisacodyl (DULCOLAX) 5 MG EC tablet Take 5 mg by mouth at bedtime.   Yes Historical Melora Menon, MD  chlorhexidine (PERIDEX) 0.12 % solution Perform mouth rinses twice daily after breakfast and at bedtime. Patient taking  differently: Use as directed in the mouth or throat 2 (two) times daily. Perform mouth rinses twice daily after breakfast and at bedtime (swish and spit) 08/24/14  Yes Dayna N Dunn, PA-C  digoxin (LANOXIN) 0.125 MG tablet Take 0.5 tablets (0.0625 mg total) by mouth daily. 05/01/14  Yes [name], MD  DULoxetine (CYMBALTA) 30 MG capsule Take 60 mg by mouth 2 (two) times daily.    Yes Historical Diamonds Lippard, MD  GLUCAGON EMERGENCY 1 MG injection Inject 1 mg as directed once as needed (hypotension).  01/30/14  Yes Historical Uzair Godley, MD  hydrALAZINE (APRESOLINE) 100 MG tablet Take 1 tablet (100 mg total) by mouth 3 (three) times daily. 08/24/14  Yes Dayna N Dunn, PA-C  insulin glargine (LANTUS) 100 unit/mL SOPN Inject 30 Units into the skin at bedtime.   Yes Historical Kydan Shanholtzer, MD  insulin lispro (HUMALOG KWIKPEN) 100 UNIT/ML KiwkPen Inject 30 Units into the skin 3 (three) times daily before meals.   Yes Historical Francely Craw, MD  levothyroxine (SYNTHROID, LEVOTHROID) 75 MCG tablet TAKE 1 TABLET ON AN EMPTY STOMACH 30 MINUTES BEFORE BREAKFAST 07/13/14  Yes  Historical Imani Sherrin, MD  Melatonin 10 MG TABS Take 10 mg by mouth at bedtime as needed (sleep).    Yes Historical Daaron Dimarco, MD  metoprolol succinate (TOPROL-XL) 25 MG 24 hr tablet Take 1 tablet (25 mg total) by mouth 2 (two) times daily before a meal. 05/29/14  Yes Amy D Clegg, NP  milrinone (PRIMACOR) 20 MG/100ML SOLN infusion Inject 51.2 mcg/min into the vein continuous. (0.64mcg/kg/min) 08/24/14  Yes Dayna N Dunn, PA-C  Multiple Vitamin (MULITIVITAMIN WITH MINERALS) TABS Take 1 tablet by mouth daily.   Yes Historical Swanson Farnell, MD  oxyCODONE-acetaminophen (PERCOCET) 10-325 MG per tablet Take 1 tablet by mouth every 4 (four) hours as needed for pain.  08/08/14  Yes Historical Jorian Willhoite, MD  Potassium Chloride ER 20 MEQ TBCR Take 20 mEq by mouth 2 (two) times daily. 08/15/14  Yes Dayna N Dunn, PA-C  rivaroxaban (XARELTO) 15 MG TABS tablet Take 1 tablet (15 mg total) by mouth daily with supper. 08/13/14  Yes [name], MD  sildenafil (REVATIO) 20 MG tablet Take 4 tablets (80 mg total) by mouth 3 (three) times daily. 08/24/14  Yes Dayna N Dunn, PA-C  spironolactone (ALDACTONE) 25 MG tablet TAKE 1 TABLET DAILY 08/14/14  Yes Jettie Booze, MD  torsemide (DEMADEX) 20 MG tablet Take 3 tablets (60 mg total) by mouth 2 (two) times daily. 08/24/14  Yes Dayna N Dunn, PA-C  zolpidem (AMBIEN) 5 MG tablet Take 1 tablet (5 mg total) by mouth at bedtime as needed for sleep. 06/04/14  Yes [name], MD  HYDROcodone-acetaminophen (NORCO) 5-325 MG per tablet Take 1-2 tablets by mouth every 6 (six) hours as needed for moderate pain. 08/26/14   Brittney Claiborne Billings, PA-C  oxyCODONE-acetaminophen (PERCOCET/ROXICET) 5-325 MG per tablet Take 1-2 tablets by mouth every 4-6 hours as needed for moderate-severe pain. Patient not taking: Reported on 08/25/2014 08/24/14   Charlie Pitter, PA-C    History   Social History  . Marital Status: Married    Spouse Name: N/A  . Number of Children: 1  . Years of Education: N/A    Occupational History  . Disabled    Social History Main Topics  . Smoking status: Former Smoker    Types: Cigars    Quit date: 02/09/1983  . Smokeless tobacco: Never Used     Comment: 25 yrs ago  . Alcohol Use:  No  . Drug Use: No  . Sexual Activity: No   Other Topics Concern  . Not on file   Social History Narrative   Lives with wife.      Family History  Problem Relation Age of Onset  . Lung cancer    . Cancer Mother     skin  . Dementia Mother   . Depression Mother   . Heart disease Father 44  . Hypertension Father   . Lung cancer Father   . Obesity Father   . Obesity Sister   . Hypertension Sister   . Diabetes Brother   . Hypertension Brother   . Heart disease Brother 9    Died of stroke MI age 62  . Obesity Brother   . Lung cancer Paternal Uncle   . Diabetes Paternal Uncle     requiring leg amputations     ROS: [x]  Positive   [ ]  Negative   [ ]  All sytems reviewed and are negative  Cardiovascular: []  chest pain/pressure []  palpitations []  SOB lying flat []  DOE []  pain in legs while walking []  pain in legs at rest []  pain in legs at night []  non-healing ulcers []  hx of DVT [x]  swelling in legs  Pulmonary: []  productive cough []  asthma/wheezing []  home O2  Neurologic: [x]  weakness in []  arms []  legs []  numbness in []  arms []  legs []  hx of CVA []  mini stroke [] difficulty speaking or slurred speech []  temporary loss of vision in one eye []  dizziness  Hematologic: []  hx of cancer []  bleeding problems []  problems with blood clotting easily  Endocrine:  [x]  diabetes []  thyroid disease   GI []  vomiting blood []  blood in stool  GU: [x]  CKD/renal failure []  HD--[]  M/W/F or []  T/T/S []  burning with urination []  blood in urine  Psychiatric: []  anxiety []  depression  Musculoskeletal: []  arthritis []  joint pain  Integumentary: []  rashes []  ulcers  Constitutional: []  fever []  chills   Physical Examination  Filed Vitals:    09/16/14 1500  BP: 102/30  Pulse: 75  Temp:   Resp: 15   Body mass index is 35.37 kg/(m^2).  General: Morbidly obese male ill appearing in NAD currently on CRRT Gait: Not observed HENT: WNL, normocephalic Pulmonary: normal non-labored breathing, clear anterior  Cardiac: irregularly irregular Abdomen: soft, obese, tenderness to palpation of right flank, mild ecchymosis RLQ Skin: pressure ulcers to bilateral heels.  Vascular Exam/Pulses: 2+ femoral pulses b/l, non palpable pedal pulses likely secondary to edema but feet are warm.  Extremities: without ischemic changes, without Gangrene  Musculoskeletal: no muscle wasting or atrophy  Neurologic: no focal deficits  CBC    Component Value Date/Time   WBC 17.5* 09/16/2014 0440   RBC 3.67* 09/16/2014 0440   HGB 10.6* 09/16/2014 0915   HCT 30.5* 09/16/2014 0915   PLT 151 09/16/2014 0440   MCV 87.2 09/16/2014 0440   MCH 30.2 09/16/2014 0440   MCHC 34.7 09/16/2014 0440   RDW 15.7* 09/16/2014 0440   LYMPHSABS 0.9 08/30/2014 1744   MONOABS 1.0 08/30/2014 1744   EOSABS 0.2 08/30/2014 1744   BASOSABS 0.0 08/30/2014 1744    BMET    Component Value Date/Time   NA 128* 09/16/2014 0440   K 4.5 09/16/2014 0440   CL 93* 09/16/2014 0440   CO2 22 09/16/2014 0440   GLUCOSE 204* 09/16/2014 0440   BUN 40* 09/16/2014 0440   CREATININE 3.68* 09/16/2014 0440   CALCIUM 7.8* 09/16/2014 0440  GFRNONAA 16* 09/16/2014 0440   GFRAA 19* 09/16/2014 0440    COAGS: Lab Results  Component Value Date   INR 1.36 09/16/2014   INR 2.16* 09/15/2014   INR 1.69* 09/15/2014    ASSESSMENT/PLAN:: This is a 64 y.o. male with sudden right abdominal/flank pain and hypotension with right retroperitoneal hematoma on CT abd/pelv.   His H/H is stable s/p 6 units of pRBCs on 09/14/14 and heparin drip discontinued. His hematoma is likely secondary to the heparin. He had a right heart cath via right groin on 08/21/14 and left IM nail femoral on 08/26/14.  He has  not been on xarelto since 08/31/14. Continue to hold heparin and monitor H/H. No indications for surgical intervention at this point. Dr. Oneida Alar to see patient and make recommendations.    Virgina Jock, PA-C [facility] Office: [phone] Pager: [phone]  History and exam findings as above.  Pt with cardiac cath but this is fairly remote and symptoms were fairly acute onset.  Most likely this represents spontaneous retroperitoneal hemorrhage from heparin or xarelto.  Pt has had on and off abdominal discomfort intermittently since repair of leg and with his obesity it would be difficult to tell if this was brewing until drop in hemoglobin and expansion  Would check coags daily for now until normal for 2 days to make sure they stay corrected. Avoid all anticoagulation including prophylactic doses until this has resolved.  Would leave decision to cardiology on restarting anticoagulation for his afib but would not consider this until hematoma has completely resolved on CT Serial hemoglobin until stable 48 hours No need for surgical intervention currently as this is most likely spontaneous hematoma and outcomes are not good with surgical treatment of these. Will follow.  Ruta Hinds, MD [facility] [place] Office: [phone] Pager: [phone]

## 2014-09-16 NOTE — Progress Notes (Signed)
Rehab admissions - I am following pt's case and noted that pt developed anemia, hypotension, and abd pain >> found to have retroperitoneal hematoma. I spoke with pt's RN and pt was having dialysis in the room during my visit. Rn said not to enter his room as they were beginning the HD session.  I will continue to follow his status. Pt has multiple medical issues currently.  Thanks.  Nanetta Batty, PT Rehabilitation Admissions Coordinator [phone]

## 2014-09-16 NOTE — Progress Notes (Signed)
OT Cancellation Note  Patient Details Name: Larry [name] MRN: [id number] DOB: [birth year]   Cancelled Treatment:    Reason Eval/Treat Not Completed: Medical issues which prohibited therapy (retroperitoneal hematoma) Pt now in ICU.  [name] 09/16/2014, 12:16 PM  [phone]

## 2014-09-16 NOTE — Progress Notes (Signed)
Utilization review complete. Bilal Manzer RN CCM Case Mgmt phone [phone] 

## 2014-09-16 NOTE — Progress Notes (Signed)
CSW services continue to follow patient for d/c needs.  He remains on HD and CIR has been consulted for possible admission. This is the option of choice per patient and wife.  They are aware of possible SNF placement- however- CSW will need to up date referral and re-fax out due to extended hospital stay.  CSW will monitor and assist with d/c plan as indicated.  [name]. [name], Jay

## 2014-09-16 NOTE — Progress Notes (Signed)
PULMONARY / CRITICAL CARE MEDICINE   Name: Larry [name] MRN: [id number] DOB: April 26, 1950    ADMISSION DATE:  08/25/2014 CONSULTATION DATE:  09/15/2014  REFERRING MD :  Bensimhon  CHIEF COMPLAINT:  Abdominal pain  INITIAL PRESENTATION:  64 yo male with hx of non ischemic CM on chronic milrinone was initially admitted for Lt hip fx.  Developed anemia, hypotension, and abd pain >> found to have retroperitoneal hematoma.  STUDIES:  7/13 Rt heart cath 8/07 CT abd/pelvis >> Rt retroperitoneal hematoma, hemorrhage in psoas muscle  SIGNIFICANT EVENTS: 7/17 Admit, ortho consulted 7/18 Lt femoral intramedullary nailing 7/21 to SDU 7/22 Renal consulted 7/23 started CRRT, changed from Gravity to heparin gtt 7/24 milrinone restarted 7/27 d/c milrinone, start levophed 7/28 add dobutamine 7/31 change to intermittent HD 8/05 IR placed Rt IJ tunneled dialysis catheter 8/06 Rt abd/flank pain, heparin d/c, transfuse PRBC 8/07 To ICU   HISTORY OF PRESENT ILLNESS:   64 yo male was admitted for Lt femur fx after fall.  He is followed by cardiology for cardiomyopathy on milrinone as outpt, a fib on xarelto, and CKD.  He has hx of OSA and OHS, but has not been able to tolerate CPAP/BiPAP as outpt.  His hospital course was complicate by AKI and he was started on CRRT.  He also had hypotension.  He had milrinone changed to dobutamine, and started on levophed.  He was changed from xarelto to heparin gtt.  He had IR place Rt IJ tunneled HD catheter.  He started developing Rt flank/abdominal pain on 8/06, and this was associated with anemia requiring blood transfusions.  He had CT abd/pelvis which showed Rt retroperitoneal hematoma, and hemorrhage in psoas muscle.  PAST MEDICAL HISTORY :   has a past medical history of NICM (nonischemic cardiomyopathy); CAD (coronary artery disease); Obesity; CVA (cerebral vascular accident); Pituitary tumor; Depression; Erectile dysfunction; DDD (degenerative disc  disease); OSA (obstructive sleep apnea); Monoclonal gammopathy; CKD (chronic kidney disease), stage III; Polyneuropathy in diabetes(357.2); Hypogonadotropic hypogonadism; Polycythemia, secondary; Hypercholesterolemia; Back pain; Monoclonal gammopathy of unknown significance; Neck pain; PAF (paroxysmal atrial fibrillation) (06/04/10); History of diverticulitis of colon; Type II or unspecified type diabetes mellitus with neurological manifestations, uncontrolled; Nonproliferative diabetic retinopathy 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  has past surgical history that includes gamma knife surgery for pituitary tumor; Tonsillectomy; Cardiac defibrillator placement (02/19/10); right heart catheterization (N/A, 09/17/2013); Cardiac catheterization; Brain surgery; Insert / replace / remove pacemaker; Cardioversion (N/A, 02/14/2014); Cardioversion (N/A, 05/02/2014); right heart catheterization (N/A, 05/08/2014); Cardiac catheterization (N/A, 06/26/2014); Cardiac catheterization (N/A, 08/21/2014); Multiple extractions with alveoloplasty (N/A, 08/22/2014); Cataract extraction; and Femur IM nail (Left, 08/26/2014). Prior to Admission medications   Medication Sig Start Date End Date Taking? Authorizing Provider  ALPRAZolam Duanne Moron) 0.5 MG tablet Take 0.5 mg by mouth See admin instructions. Take 1 tablet (0.5 mg) every night, may take an additional tablet two more times during the day as needed for anxiety   Yes Historical Provider, MD  amiodarone (PACERONE) 200 MG tablet Take 1 tablet (200 mg total) by mouth daily. 06/19/14  Yes [name], MD  atorvastatin (LIPITOR) 40 MG tablet TAKE 1 TABLET DAILY 03/18/14  Yes [name], MD  bisacodyl  (DULCOLAX) 5 MG EC tablet Take 5 mg by mouth at bedtime.   Yes Historical Provider, MD  chlorhexidine (PERIDEX) 0.12 % solution Perform mouth rinses  twice daily after breakfast and at bedtime. Patient taking differently: Use as directed in the mouth or throat 2 (two) times daily. Perform mouth rinses twice daily after breakfast and at bedtime (swish and spit) 08/24/14  Yes Dayna N Dunn, PA-C  digoxin (LANOXIN) 0.125 MG tablet Take 0.5 tablets (0.0625 mg total) by mouth daily. 05/01/14  Yes [name], MD  DULoxetine (CYMBALTA) 30 MG capsule Take 60 mg by mouth 2 (two) times daily.    Yes Historical Provider, MD  GLUCAGON EMERGENCY 1 MG injection Inject 1 mg as directed once as needed (hypotension).  01/30/14  Yes Historical Provider, MD  hydrALAZINE (APRESOLINE) 100 MG tablet Take 1 tablet (100 mg total) by mouth 3 (three) times daily. 08/24/14  Yes Dayna N Dunn, PA-C  insulin glargine (LANTUS) 100 unit/mL SOPN Inject 30 Units into the skin at bedtime.   Yes Historical Provider, MD  insulin lispro (HUMALOG KWIKPEN) 100 UNIT/ML KiwkPen Inject 30 Units into the skin 3 (three) times daily before meals.   Yes Historical Provider, MD  levothyroxine (SYNTHROID, LEVOTHROID) 75 MCG tablet TAKE 1 TABLET ON AN EMPTY STOMACH 30 MINUTES BEFORE BREAKFAST 07/13/14  Yes Historical Provider, MD  Melatonin 10 MG TABS Take 10 mg by mouth at bedtime as needed (sleep).    Yes Historical Provider, MD  metoprolol succinate (TOPROL-XL) 25 MG 24 hr tablet Take 1 tablet (25 mg total) by mouth 2 (two) times daily before a meal. 05/29/14  Yes Amy D Clegg, NP  milrinone (PRIMACOR) 20 MG/100ML SOLN infusion Inject 51.2 mcg/min into the vein continuous. (0.55mcg/kg/min) 08/24/14  Yes Dayna N Dunn, PA-C  Multiple Vitamin (MULITIVITAMIN WITH MINERALS) TABS Take 1 tablet by mouth daily.   Yes Historical Provider, MD  oxyCODONE-acetaminophen (PERCOCET) 10-325 MG per tablet Take 1 tablet by mouth every 4 (four) hours as needed for pain.   08/08/14  Yes Historical Provider, MD  Potassium Chloride ER 20 MEQ TBCR Take 20 mEq by mouth 2 (two) times daily. 08/15/14  Yes Dayna N Dunn, PA-C  rivaroxaban (XARELTO) 15 MG TABS tablet Take 1 tablet (15 mg total) by mouth daily with supper. 08/13/14  Yes [name], MD  sildenafil (REVATIO) 20 MG tablet Take 4 tablets (80 mg total) by mouth 3 (three) times daily. 08/24/14  Yes Dayna N Dunn, PA-C  spironolactone (ALDACTONE) 25 MG tablet TAKE 1 TABLET DAILY 08/14/14  Yes Jettie Booze, MD  torsemide (DEMADEX) 20 MG tablet Take 3 tablets (60 mg total) by mouth 2 (two) times daily. 08/24/14  Yes Dayna N Dunn, PA-C  zolpidem (AMBIEN) 5 MG tablet Take 1 tablet (5 mg total) by mouth at bedtime as needed for sleep. 06/04/14  Yes [name], MD  HYDROcodone-acetaminophen (NORCO) 5-325 MG per tablet Take 1-2 tablets by mouth every 6 (six) hours as needed for moderate pain. 08/26/14   Brittney Claiborne Billings, PA-C  oxyCODONE-acetaminophen (PERCOCET/ROXICET) 5-325 MG per tablet Take 1-2 tablets by mouth every 4-6 hours as needed for moderate-severe pain. Patient not taking: Reported on 08/25/2014 08/24/14   Dayna N Dunn, PA-C   Allergies  Allergen Reactions  . Bydureon [Exenatide] Other (See Comments)    sweating  . Losartan Potassium Other (See Comments)    insomnia    FAMILY HISTORY:  indicated that his mother is alive. He indicated that his father is deceased. He indicated that his sister is alive. He indicated that his brother is deceased. He indicated that his maternal grandmother is deceased. He indicated that  his maternal grandfather is deceased. He indicated that his paternal grandmother is deceased. He indicated that his paternal grandfather is deceased. He indicated that his paternal uncle is deceased.  SOCIAL HISTORY:  reports that he quit smoking about 31 years ago. His smoking use included Cigars. He has never used smokeless tobacco. He reports that he does not drink alcohol or use illicit  drugs.  REVIEW OF SYSTEMS:   Denies chest pain, dyspnea, headache, nausea.  Has pain in his LT hip area.  SUBJECTIVE:   VITAL SIGNS: Temp:  [97.3 F (36.3 C)-98.5 F (36.9 C)] 97.6 F (36.4 C) (08/08 0800) Pulse Rate:  [39-105] 76 (08/08 0800) Resp:  [10-25] 15 (08/08 0800) BP: (59-135)/(24-91) 95/42 mmHg (08/08 0800) SpO2:  [61 %-100 %] 100 % (08/08 0800) Arterial Line BP: (70-120)/(41-71) 70/43 mmHg (08/08 0800) HEMODYNAMICS: CVP:  [7 mmHg-13 mmHg] 7 mmHg INTAKE / OUTPUT:  Intake/Output Summary (Last 24 hours) at 09/16/14 0926 Last data filed at 09/16/14 0700  Gross per 24 hour  Intake 3981.97 ml  Output      0 ml  Net 3981.97 ml    PHYSICAL EXAMINATION: General: seen in ICU Neuro:  Alert, normal strength, moves extremities HEENT:  No sinus tenderness, edentulous Cardiovascular: irregular, no murmur Lungs:  No wheeze, faint basilar crackles Abdomen:  Tender in Rt flank Musculoskeletal:  1+ edema, DP pulses palpable Skin:  No rashes  LABS:  CBC  Recent Labs Lab 09/15/14 1700 09/15/14 2110 09/15/14 2243 09/16/14 0440  WBC 10.6* 14.1*  --  17.5*  HGB 7.2* 10.9* 11.2* 11.1*  HCT 22.1* 31.9* 31.9* 32.0*  PLT 109* 131*  --  151   Coag's  Recent Labs Lab 09/15/14 0350 09/15/14 1700 09/16/14 0440  INR 1.69* 2.16* 1.36     BMET  Recent Labs Lab 09/15/14 0306 09/15/14 2243 09/16/14 0440  NA 132* 127* 128*  K 4.3 4.5 4.5  CL 96* 94* 93*  CO2 26 23 22   BUN 25* 35* 40*  CREATININE 2.66* 3.48* 3.68*  GLUCOSE 120* 215* 204*   Electrolytes  Recent Labs Lab 09/10/14 0525 09/11/14 0452 09/12/14 0630  09/15/14 0306 09/15/14 2243 09/16/14 0440  CALCIUM 8.3* 8.6* 8.6*  < > 8.0* 7.6* 7.8*  MG 2.2 2.3 2.2  --  2.0  --   --   PHOS 4.1 5.2* 6.0*  --   --   --   --   < > = values in this interval not displayed. Sepsis Markers  Recent Labs Lab 09/15/14 1215  LATICACIDVEN 1.4   ABG No results for input(s): PHART, PCO2ART, PO2ART in the last  168 hours.   Liver Enzymes  Recent Labs Lab 09/10/14 0525 09/11/14 0452 09/12/14 0630  ALBUMIN 2.4* 2.4* 2.5*   Cardiac Enzymes No results for input(s): TROPONINI, PROBNP in the last 168 hours.   Glucose  Recent Labs Lab 09/15/14 1250 09/15/14 1610 09/15/14 1703 09/15/14 1705 09/15/14 2125 09/16/14 0805  GLUCAP 133* 391* >600* 195* 185* 191*    Imaging Ct Abdomen Pelvis Wo Contrast  09/15/2014   CLINICAL DATA:  Acute onset of severe abdominal pain. Initial encounter.  EXAM: CT ABDOMEN AND PELVIS WITHOUT CONTRAST  TECHNIQUE: Multidetector CT imaging of the abdomen and pelvis was performed following the standard protocol without IV contrast.  COMPARISON:  CT of the abdomen and pelvis performed 05/27/2014, and renal ultrasound performed 08/31/2014  FINDINGS: Small bilateral pleural effusions are noted, with bibasilar atelectasis and scattered calcifications likely reflecting calcified granulomata. Diffuse  coronary artery calcifications are seen. A pacemaker lead is partially imaged.  There is a very large right retroperitoneal hematoma, measuring 24.2 x 16.9 x 19.0 cm, containing a combination of acute and chronic hemorrhage. This appears partially contiguous with additional acute blood tracking superiorly along the retroperitoneum, and blood tracking about the IVC and the anterior aspect of Gerota's fascia. Trace blood tracks about the duodenum, which is displaced anteriorly by the large collection.  There appears to be some degree of hemorrhage within the right psoas muscle more inferiorly, with a somewhat unusual 4.3 x 3.3 x 2.6 cm low attenuation collection within the right psoas. This is not well assessed without contrast, but raises concern for underlying abscess within the right psoas. Alternatively, it could reflect unusual accumulation of chronic blood within the psoas.  There is also a small amount of intraperitoneal hemorrhage tracking along the the paracolic gutters bilaterally  into the pelvis. A small amount of blood is seen tracking about the liver and spleen, and a small amount of lower attenuation fluid is seen tracking into a small umbilical hernia.  The liver and spleen are otherwise unremarkable in appearance. The gallbladder is distended but likely within normal limits. The pancreas and adrenal glands are unremarkable.  The very large retroperitoneal hematoma displaces the right kidney anteriorly. Nonspecific perinephric stranding is noted bilaterally. There is no evidence of hydronephrosis. No renal or ureteral stones are seen.  No free fluid is identified. The small bowel is unremarkable in appearance. The stomach is within normal limits. No acute vascular abnormalities are seen. Scattered calcification is noted along the abdominal aorta and its branches.  The appendix is normal in caliber, without evidence of appendicitis. Scattered diverticulosis is noted along the descending and sigmoid colon, without evidence of diverticulitis.  The bladder is mildly distended. A small amount of air within the bladder is of uncertain significance. Would correlate for recent Foley catheter placement. The prostate is borderline normal in size. No inguinal lymphadenopathy is seen.  No acute osseous abnormalities are identified. The patient's left hip hardware is grossly unremarkable, though incompletely imaged. A residual underlying left femoral intertrochanteric fracture line is partially imaged.  IMPRESSION: 1. Very large right retroperitoneal hematoma, measuring 24.2 x 16.9 x 19.0 cm, containing a combination of acute and chronic hemorrhage. This appears partially contiguous with additional acute blood tracking superiorly along the retroperitoneum, and blood tracking about the IVC and the anterior aspect of Gerota's fascia. Trace blood tracks about the duodenum. This likely reflects persistent hemorrhage status post the patient's prior cardiac catheterization on 08/21/2014. It may reflect  some degree of injury to the IVC, given the presence of blood about the IVC and within the perineum. 2. Small amount of intraperitoneal hemorrhage tracks along the paracolic gutters bilaterally into the pelvis. Small amount of blood seen tracking about the liver and spleen. Small amount of lower attenuation fluid may reflect chronic blood, tracking into a small umbilical hernia. 3. Some degree of hemorrhage within the right psoas muscle more inferiorly. Associated unusual 4.3 x 3.3 x 2.6 cm low attenuation collection within the right psoas muscle. This is not well assessed without contrast, but raises concern for underlying abscess within the right psoas. Alternately, it could reflect unusual accumulation of chronic blood within the psoas. 4. Small bilateral pleural effusions, with bibasilar atelectasis and scattered associated calcifications likely reflecting calcified granulomata. 5. Anterior displacement of the right kidney and duodenum by the very large hematoma. 6. Small amount of air noted within the bladder,  of uncertain significance. Would correlate for recent Foley catheter placement. 7. Scattered diverticulosis along the descending and sigmoid colon, without evidence of diverticulitis. 8. Scattered calcification along the abdominal aorta and its branches. 9. Diffuse coronary artery calcifications seen.  Critical Value/emergent results were called by telephone at the time of interpretation on 09/15/2014 at 9:36 pm to the [facility] physician on call, who verbally acknowledged these results.   Electronically Signed   By: Garald Balding M.D.   On: 09/15/2014 21:48   ASSESSMENT / PLAN:  PULMONARY A: Hx of OSA, OHS >> not able to tolerate CPAP/BiPAP as outpt. P:   Oxygen to keep SpO2 > 92% Was on O2 at home so will not titrate to off for now.  CARDIOVASCULAR Rt IJ HD catheter 7/23 >> 8/05 Rt IJ tunneled catheter 8/05 >> Rt PICC >>  A:  Cardiomyopathy with acute on chronic systolic heart  failure. Pulmonary HTN. A fib. NSVT. Non obstructive CAD. P:  Amiodarone, lipitor, revatio per cardiology Dobutamine 2.5 mcg. Levophed 26 mcg.  RENAL A:   AKI. CKD stage 4. P:   HD per renal. BMET in AM. Replace electrolytes as indicated.  GASTROINTESTINAL A:   Abdominal pain from Retroperitoneal hematoma. P:   PRN analgesics.  HEMATOLOGIC A:   Retroperitoneal hematoma. P:  F/u CBC, coags and transfuse as needed. SCD's for DVT prevention.  INFECTIOUS A:   UTI. P:   Day 7 of rocephin  Urine 8/02 >> Serratia marcescens  ENDOCRINE A:   Hx of hypothyroidism. P:   Monitor blood sugar  NEUROLOGIC A:   Hx of depression. P:   Continue cymbalta  Updated pt and wife at bedside.  Will hold in the ICU given pressor demand.  Will HD in the room today, ?CRRT but will defer to renal.  Continue pressor support and monitor of Hg.  The patient is critically ill with multiple organ systems failure and requires high complexity decision making for assessment and support, frequent evaluation and titration of therapies, application of advanced monitoring technologies and extensive interpretation of multiple databases.   Critical Care Time devoted to patient care services described in this note is  35  Minutes. This time reflects time of care of this signee Dr Jennet Maduro. This critical care time does not reflect procedure time, or teaching time or supervisory time of PA/NP/Med student/Med Resident etc but could involve care discussion time.  Rush Farmer, M.D. [facility] Pulmonary/Critical Care Medicine. Pager: [phone]. After hours pager: [phone].  09/16/2014, 9:26 AM

## 2014-09-16 NOTE — Progress Notes (Signed)
PT Cancellation Note  Patient Details Name: Larry [name] MRN: [id number] DOB: 1950-10-06   Cancelled Treatment:    Reason Eval/Treat Not Completed: Medical issues which prohibited therapy (retroperitoneal hematoma)   Jolisa Intriago 09/16/2014, 11:47 AM  Suanne Marker PT [phone]

## 2014-09-17 DIAGNOSIS — K661 Hemoperitoneum: Secondary | ICD-10-CM | POA: Insufficient documentation

## 2014-09-17 DIAGNOSIS — N184 Chronic kidney disease, stage 4 (severe): Secondary | ICD-10-CM

## 2014-09-17 LAB — BASIC METABOLIC PANEL
Anion gap: 7 (ref 5–15)
Anion gap: 8 (ref 5–15)
BUN: 20 mg/dL (ref 6–20)
BUN: 21 mg/dL — AB (ref 6–20)
CALCIUM: 7.6 mg/dL — AB (ref 8.9–10.3)
CHLORIDE: 99 mmol/L — AB (ref 101–111)
CO2: 26 mmol/L (ref 22–32)
CO2: 26 mmol/L (ref 22–32)
CREATININE: 2.3 mg/dL — AB (ref 0.61–1.24)
Calcium: 7.6 mg/dL — ABNORMAL LOW (ref 8.9–10.3)
Chloride: 97 mmol/L — ABNORMAL LOW (ref 101–111)
Creatinine, Ser: 2.38 mg/dL — ABNORMAL HIGH (ref 0.61–1.24)
GFR calc Af Amer: 33 mL/min — ABNORMAL LOW (ref >60)
GFR calc Af Amer: 32 mL/min — ABNORMAL LOW (ref >60)
GFR, EST NON AFRICAN AMERICAN: 28 mL/min — AB (ref >60)
GFR, EST NON AFRICAN AMERICAN: 29 mL/min — AB (ref >60)
GLUCOSE: 166 mg/dL — AB (ref 65–99)
GLUCOSE: 170 mg/dL — AB (ref 65–99)
POTASSIUM: 3.7 mmol/L (ref 3.5–5.1)
POTASSIUM: 3.8 mmol/L (ref 3.5–5.1)
SODIUM: 132 mmol/L — AB (ref 135–145)
Sodium: 131 mmol/L — ABNORMAL LOW (ref 135–145)

## 2014-09-17 LAB — GLUCOSE, CAPILLARY
GLUCOSE-CAPILLARY: 131 mg/dL — AB (ref 65–99)
Glucose-Capillary: 158 mg/dL — ABNORMAL HIGH (ref 65–99)
Glucose-Capillary: 150 mg/dL — ABNORMAL HIGH (ref 65–99)
Glucose-Capillary: 166 mg/dL — ABNORMAL HIGH (ref 65–99)
Glucose-Capillary: 166 mg/dL — ABNORMAL HIGH (ref 65–99)
Glucose-Capillary: 146 mg/dL — ABNORMAL HIGH (ref 65–99)

## 2014-09-17 LAB — CBC
HCT: 26.5 % — ABNORMAL LOW (ref 39.0–52.0)
Hemoglobin: 9.1 g/dL — ABNORMAL LOW (ref 13.0–17.0)
MCH: 31.3 pg (ref 26.0–34.0)
MCHC: 34.3 g/dL (ref 30.0–36.0)
MCV: 91.1 fL (ref 78.0–100.0)
PLATELETS: 121 10*3/uL — AB (ref 150–400)
RBC: 2.91 MIL/uL — ABNORMAL LOW (ref 4.22–5.81)
RDW: 16.6 % — ABNORMAL HIGH (ref 11.5–15.5)
WBC: 11.8 10*3/uL — ABNORMAL HIGH (ref 4.0–10.5)

## 2014-09-17 LAB — PHOSPHORUS: PHOSPHORUS: 3.9 mg/dL (ref 2.5–4.6)

## 2014-09-17 LAB — APTT: APTT: 38 s — AB (ref 24–37)

## 2014-09-17 LAB — MAGNESIUM
Magnesium: 2 mg/dL (ref 1.7–2.4)
Magnesium: 1.9 mg/dL (ref 1.7–2.4)

## 2014-09-17 LAB — PROTIME-INR
INR: 1.29 (ref 0.00–1.49)
Prothrombin Time: 16.2 seconds — ABNORMAL HIGH (ref 11.6–15.2)

## 2014-09-17 LAB — PARATHYROID HORMONE, INTACT (NO CA): PTH: 90 pg/mL — ABNORMAL HIGH (ref 15–65)

## 2014-09-17 LAB — HEPATITIS B SURFACE ANTIGEN: Hepatitis B Surface Ag: NEGATIVE

## 2014-09-17 MED ORDER — MAGNESIUM SULFATE 2 GM/50ML IV SOLN
2.0000 g | Freq: Once | INTRAVENOUS | Status: AC
  Administered 2014-09-17: 2 g via INTRAVENOUS
  Filled 2014-09-17: qty 50

## 2014-09-17 MED ORDER — POTASSIUM CHLORIDE CRYS ER 20 MEQ PO TBCR
20.0000 meq | EXTENDED_RELEASE_TABLET | Freq: Once | ORAL | Status: AC
  Administered 2014-09-17: 20 meq via ORAL
  Filled 2014-09-17: qty 1

## 2014-09-17 MED ORDER — POTASSIUM CHLORIDE CRYS ER 20 MEQ PO TBCR: 40.0000 meq | EXTENDED_RELEASE_TABLET | Freq: Once | ORAL | Status: DC

## 2014-09-17 MED ORDER — POTASSIUM CHLORIDE CRYS ER 20 MEQ PO TBCR
20.0000 meq | EXTENDED_RELEASE_TABLET | Freq: Once | ORAL | Status: AC
  Administered 2014-09-17: 20 meq via ORAL
  Filled 2014-09-17 (×2): qty 1

## 2014-09-17 NOTE — Progress Notes (Signed)
PULMONARY / CRITICAL CARE MEDICINE   Name: Larry [name] MRN: [id number] DOB: 1950/03/10    ADMISSION DATE:  08/25/2014 CONSULTATION DATE:  09/15/2014  REFERRING MD :  Bensimhon  CHIEF COMPLAINT:  Abdominal pain  INITIAL PRESENTATION:  64 yo male with hx of non ischemic CM on chronic milrinone was initially admitted for Lt hip fx.  Developed anemia, hypotension, and abd pain >> found to have retroperitoneal hematoma.  STUDIES:  7/13 Rt heart cath 8/07 CT abd/pelvis >> Rt retroperitoneal hematoma, hemorrhage in psoas muscle  SIGNIFICANT EVENTS: 7/17 Admit, ortho consulted 7/18 Lt femoral intramedullary nailing 7/21 to SDU 7/22 Renal consulted 7/23 started CRRT, changed from Black to heparin gtt 7/24 milrinone restarted 7/27 d/c milrinone, start levophed 7/28 add dobutamine 7/31 change to intermittent HD 8/05 IR placed Rt IJ tunneled dialysis catheter 8/06 Rt abd/flank pain, heparin d/c, transfuse PRBC 8/07 To ICU 8/9 no further bleeding, plan HD today    SUBJECTIVE:  Some pain R hip/flank, Hgb stable, made urine  VITAL SIGNS: Temp:  [97.9 F (36.6 C)-98.2 F (36.8 C)] 97.9 F (36.6 C) (08/09 0714) Pulse Rate:  [72-81] 78 (08/09 0714) Resp:  [10-19] 16 (08/09 0714) BP: (88-123)/(27-55) 119/42 mmHg (08/09 0714) SpO2:  [95 %-100 %] 98 % (08/09 0714) Arterial Line BP: (100-136)/(37-79) 120/44 mmHg (08/09 0603) Weight:  [109.9 kg (242 lb 4.6 oz)-111.8 kg (246 lb 7.6 oz)] 109.9 kg (242 lb 4.6 oz) (08/08 1614) HEMODYNAMICS: CVP:  [7 mmHg-12 mmHg] 7 mmHg INTAKE / OUTPUT:  Intake/Output Summary (Last 24 hours) at 09/17/14 0915 Last data filed at 09/17/14 0600  Gross per 24 hour  Intake 1558.65 ml  Output   2400 ml  Net -841.35 ml    PHYSICAL EXAMINATION: General: awake and alert HENT: NCAT EOMi PULM: few crackles bases, otherwise clear, breathing comfortably CV: RRR on my exam, no mgr GI: BS+, soft Derm: edema in feet bilaterally, warm, mild bruising right  groin Neuro: Awake, alert, moves all four ext  LABS:  CBC  Recent Labs Lab 09/15/14 2110  09/16/14 0440 09/16/14 0915 09/16/14 2120 09/17/14 0440  WBC 14.1*  --  17.5*  --   --  11.8*  HGB 10.9*  < > 11.1* 10.6* 9.4* 9.1*  HCT 31.9*  < > 32.0* 30.5* 27.3* 26.5*  PLT 131*  --  151  --   --  121*  < > = values in this interval not displayed. Coag's  Recent Labs Lab 09/15/14 1700 09/16/14 0440 09/17/14 0440  APTT  --   --  38*  INR 2.16* 1.36 1.29     BMET  Recent Labs Lab 09/16/14 0440 09/17/14 0035 09/17/14 0440  NA 128* 132* 131*  K 4.5 3.7 3.8  CL 93* 99* 97*  CO2 22 26 26   BUN 40* 20 21*  CREATININE 3.68* 2.30* 2.38*  GLUCOSE 204* 166* 170*   Electrolytes  Recent Labs Lab 09/11/14 0452 09/12/14 0630  09/15/14 0306  09/16/14 0440 09/17/14 0035 09/17/14 0440  CALCIUM 8.6* 8.6*  < > 8.0*  < > 7.8* 7.6* 7.6*  MG 2.3 2.2  --  2.0  --   --  2.0 1.9  PHOS 5.2* 6.0*  --   --   --   --   --  3.9  < > = values in this interval not displayed. Sepsis Markers  Recent Labs Lab 09/15/14 1215  LATICACIDVEN 1.4   ABG No results for input(s): PHART, PCO2ART, PO2ART in the last 168  hours.   Liver Enzymes  Recent Labs Lab 09/11/14 0452 09/12/14 0630  ALBUMIN 2.4* 2.5*   Cardiac Enzymes No results for input(s): TROPONINI, PROBNP in the last 168 hours.   Glucose  Recent Labs Lab 09/15/14 2125 09/16/14 0805 09/16/14 1120 09/16/14 1625 09/16/14 2114 09/17/14 0713  GLUCAP 185* 191* 200* 130* 158* 150*    Imaging No results found.   ASSESSMENT / PLAN:  PULMONARY A: Chronic hypoxemic respiratory failure due to CHF Hx of OSA, OHS >> not able to tolerate CPAP/BiPAP as outpt. P:   Oxygen to keep SpO2 > 92% Was on O2 at home so will not titrate to off for now.  CARDIOVASCULAR Rt IJ HD catheter 7/23 >> 8/05 Rt IJ tunneled catheter 8/05 >> Rt PICC >>  A:  Cardiomyopathy with acute on chronic systolic heart failure Pulmonary HTN A  fib NSVT Non obstructive CAD P:  Amiodarone, lipitor, revatio per cardiology Pressors per cardiology  RENAL A:   AKI > made urine in last 24 hours, hopefully sign of recovery CKD stage 4 P:   HD per renal Monitor BMET and UOP Replace electrolytes as needed   GASTROINTESTINAL A:   Abdominal pain from Retroperitoneal hematoma P:   PRN analgesics Diet as tolerated  HEMATOLOGIC A:   Retroperitoneal hematoma requiring large volume blood resuscitation, now Hgb stable Chronic anticoagulation for Afib > now off VERY HIGH RISK FOR DVT P:  Transfuse for Hgb < 7gm/dL Hold all anticoagulation per vascular surgery recommendation SCD's for DVT prevention Out of bed, mobilize as soon as possible for DVT prevention Monitor leg exam closely for swelling  INFECTIOUS A:   UTI P:   Day 8 of rocephin > stop today  Urine 8/02 >> Serratia marcescens  ENDOCRINE A:   Hx of hypothyroidism P:   Continue sythroid Monitor blood sugar  NEUROLOGIC A:   Hx of depression P:   Continue cymbalta  Updated pt and wife at bedside  Summary: PCCM to sign off. I stopped ceftriaxone.  Would monitor for DVT very carefully and mobilize ASAP as he is very high risk for DVT.  Considering his hematoma and the inherent risk they carry, I don't think that placing a temporary IVC filter would be worth the risk.  Roselie Awkward, MD [place] [facility] Pager: [phone] Cell: [phone] After 3pm or if no response, call [phone]   09/17/2014, 9:15 AM

## 2014-09-17 NOTE — Progress Notes (Signed)
Rehab admissions - I am following pt's case from [place] and pt continues to have medical issues (HD, currently on Levophed). I have reviewed latest PT note. I will continue to follow pt's status. He is not medically appropriate for CIR at this time. Note: we will have to submit his case to [employer] when appropriate to seek insurance authorization.   Thanks.  Nanetta Batty, PT Rehabilitation Admissions Coordinator [phone]

## 2014-09-17 NOTE — Consult Note (Signed)
Hgb stable continue to observe tends in hemoglobin and coags  Ruta Hinds, MD [facility] Office: [phone] Pager: [phone]

## 2014-09-17 NOTE — Progress Notes (Signed)
Patient ID: Larry [name], male   DOB: 10-30-1950, 64 y.o.   MRN: [id number] Advanced Heart Failure Rounding Note   Subjective:    Larry [name] is a 64 y/o man with history of chronic systolic CHF (nonischemic cardiomyopathy) s/p 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 He is on chronic milrinone 0.5 mcg.  Prior to admit he was being considered for LVAD.   Dischaged 7/16 after dental procedures. Also had RHC with elevate pulmonary pressures. Revatio was increased to 80 mg tid during that stay. Admitted with fall---> broken L  hip . 7/18 had IM Nail L femur.  7/23 started CVVHD, CVVH stopped 7/31.  8/6 began having severe right ab/flank pain and was found to have large rectus sheath hematoma 8/7 with transfer to ICU.  Feeling better overall today. Long beat of NSVT around midnight.  50 beats, 20 seconds. ICD did not fire, he did not feel any different.   Had HD in room yesterday, says he they may do it several days in a row. Remains on levophed 20.  Weight shows up 228 -> 242.  CVP 8-9.   Objective:   Weight Range:  Vital Signs:   Temp:  [97.6 F (36.4 C)-98.2 F (36.8 C)] 97.9 F (36.6 C) (08/09 0714) Pulse Rate:  [72-81] 78 (08/09 0714) Resp:  [10-19] 16 (08/09 0714) BP: (88-123)/(27-55) 119/42 mmHg (08/09 0714) SpO2:  [95 %-100 %] 98 % (08/09 0714) Arterial Line BP: (70-136)/(37-79) 120/44 mmHg (08/09 0603) Weight:  [242 lb 4.6 oz (109.9 kg)-246 lb 7.6 oz (111.8 kg)] 242 lb 4.6 oz (109.9 kg) (08/08 1614) Last BM Date: 09/13/14  Weight change: Filed Weights   09/14/14 2011 09/16/14 1148 09/16/14 1614  Weight: 228 lb 6.3 oz (103.6 kg) 246 lb 7.6 oz (111.8 kg) 242 lb 4.6 oz (109.9 kg)    Intake/Output:   Intake/Output Summary (Last 24 hours) at 09/17/14 0735 Last data filed at 09/17/14 0600  Gross per 24 hour  Intake 1644.92 ml  Output   2400 ml  Net -755.08 ml     Physical Exam: CVP  7 General:   lying in bed NAD HEENT: normal Neck: supple. JVP 7. Tunneled HD catheter.  Carotids 2+ bilat; no bruits. No lymphadenopathy or thryomegaly appreciated. Cor: PMI nondisplaced. Regular rate & rhythm. No rubs, gallops or murmurs. Lungs: clear Abdomen: soft, nondistended. Large hematoma over right abdominal wall. Very tender to palpation. No rebound. + bowel sounds. Extremities: no cyanosis, clubbing, rash. Trace- 1 + peripheral edema Neuro: Alert/oriented in pain  Telemetry: NSR 70-80s occasional V pacing.  ~50 beat run of NSVT  Labs: Basic Metabolic Panel:  Recent Labs Lab 09/11/14 0452 09/12/14 0630  09/15/14 0306 09/15/14 2243 09/16/14 0440 09/17/14 0035 09/17/14 0440  NA 131* 125*  < > 132* 127* 128* 132* 131*  K 4.3 4.8  < > 4.3 4.5 4.5 3.7 3.8  CL 96* 90*  < > 96* 94* 93* 99* 97*  CO2 27 24  < > 26 23 22 26 26   GLUCOSE 126* 129*  < > 120* 215* 204* 166* 170*  BUN 31* 45*  < > 25* 35* 40* 20 21*  CREATININE 3.94* 4.87*  < > 2.66* 3.48* 3.68* 2.30* 2.38*  CALCIUM 8.6* 8.6*  < > 8.0* 7.6* 7.8* 7.6* 7.6*  MG 2.3 2.2  --  2.0  --   --  2.0 1.9  PHOS 5.2* 6.0*  --   --   --   --   --  3.9  < > = values in this interval not displayed.  Liver Function Tests:  Recent Labs Lab 09/11/14 0452 09/12/14 0630  ALBUMIN 2.4* 2.5*   No results for input(s): LIPASE, AMYLASE in the last 168 hours. No results for input(s): AMMONIA in the last 168 hours.  CBC:  Recent Labs Lab 09/15/14 1330 09/15/14 1700 09/15/14 2110 09/15/14 2243 09/16/14 0440 09/16/14 0915 09/16/14 2120 09/17/14 0440  WBC 11.4* 10.6* 14.1*  --  17.5*  --   --  11.8*  HGB 6.0* 7.2* 10.9* 11.2* 11.1* 10.6* 9.4* 9.1*  HCT 18.0* 22.1* 31.9* 31.9* 32.0* 30.5* 27.3* 26.5*  MCV 91.8 94.4 88.4  --  87.2  --   --  91.1  PLT 126* 109* 131*  --  151  --   --  121*    Cardiac Enzymes: No results for input(s): CKTOTAL, CKMB, CKMBINDEX, TROPONINI in the last 168 hours.  BNP: BNP (last 3  results)  Recent Labs  05/22/14 1029 05/27/14 1126 06/19/14 1030  BNP 935.2* 1627.8* 738.9*    ProBNP (last 3 results)  Recent Labs  10/09/13 0926 11/14/13 0926 01/14/14 1112  PROBNP 1561.0* 1261.0* 5769.0*      Other results:  Imaging: Ct Abdomen Pelvis Wo Contrast  09/15/2014   CLINICAL DATA:  Acute onset of severe abdominal pain. Initial encounter.  EXAM: CT ABDOMEN AND PELVIS WITHOUT CONTRAST  TECHNIQUE: Multidetector CT imaging of the abdomen and pelvis was performed following the standard protocol without IV contrast.  COMPARISON:  CT of the abdomen and pelvis performed 05/27/2014, and renal ultrasound performed 08/31/2014  FINDINGS: Small bilateral pleural effusions are noted, with bibasilar atelectasis and scattered calcifications likely reflecting calcified granulomata. Diffuse coronary artery calcifications are seen. A pacemaker lead is partially imaged.  There is a very large right retroperitoneal hematoma, measuring 24.2 x 16.9 x 19.0 cm, containing a combination of acute and chronic hemorrhage. This appears partially contiguous with additional acute blood tracking superiorly along the retroperitoneum, and blood tracking about the IVC and the anterior aspect of Gerota's fascia. Trace blood tracks about the duodenum, which is displaced anteriorly by the large collection.  There appears to be some degree of hemorrhage within the right psoas muscle more inferiorly, with a somewhat unusual 4.3 x 3.3 x 2.6 cm low attenuation collection within the right psoas. This is not well assessed without contrast, but raises concern for underlying abscess within the right psoas. Alternatively, it could reflect unusual accumulation of chronic blood within the psoas.  There is also a small amount of intraperitoneal hemorrhage tracking along the the paracolic gutters bilaterally into the pelvis. A small amount of blood is seen tracking about the liver and spleen, and a small amount of lower  attenuation fluid is seen tracking into a small umbilical hernia.  The liver and spleen are otherwise unremarkable in appearance. The gallbladder is distended but likely within normal limits. The pancreas and adrenal glands are unremarkable.  The very large retroperitoneal hematoma displaces the right kidney anteriorly. Nonspecific perinephric stranding is noted bilaterally. There is no evidence of hydronephrosis. No renal or ureteral stones are seen.  No free fluid is identified. The small bowel is unremarkable in appearance. The stomach is within normal limits. No acute vascular abnormalities are seen. Scattered calcification is noted along the abdominal aorta and its branches.  The appendix is normal in caliber, without evidence of appendicitis. Scattered diverticulosis is noted along the descending and sigmoid colon, without evidence of diverticulitis.  The bladder  is mildly distended. A small amount of air within the bladder is of uncertain significance. Would correlate for recent Foley catheter placement. The prostate is borderline normal in size. No inguinal lymphadenopathy is seen.  No acute osseous abnormalities are identified. The patient's left hip hardware is grossly unremarkable, though incompletely imaged. A residual underlying left femoral intertrochanteric fracture line is partially imaged.  IMPRESSION: 1. Very large right retroperitoneal hematoma, measuring 24.2 x 16.9 x 19.0 cm, containing a combination of acute and chronic hemorrhage. This appears partially contiguous with additional acute blood tracking superiorly along the retroperitoneum, and blood tracking about the IVC and the anterior aspect of Gerota's fascia. Trace blood tracks about the duodenum. This likely reflects persistent hemorrhage status post the patient's prior cardiac catheterization on 08/21/2014. It may reflect some degree of injury to the IVC, given the presence of blood about the IVC and within the perineum. 2. Small amount  of intraperitoneal hemorrhage tracks along the paracolic gutters bilaterally into the pelvis. Small amount of blood seen tracking about the liver and spleen. Small amount of lower attenuation fluid may reflect chronic blood, tracking into a small umbilical hernia. 3. Some degree of hemorrhage within the right psoas muscle more inferiorly. Associated unusual 4.3 x 3.3 x 2.6 cm low attenuation collection within the right psoas muscle. This is not well assessed without contrast, but raises concern for underlying abscess within the right psoas. Alternately, it could reflect unusual accumulation of chronic blood within the psoas. 4. Small bilateral pleural effusions, with bibasilar atelectasis and scattered associated calcifications likely reflecting calcified granulomata. 5. Anterior displacement of the right kidney and duodenum by the very large hematoma. 6. Small amount of air noted within the bladder, of uncertain significance. Would correlate for recent Foley catheter placement. 7. Scattered diverticulosis along the descending and sigmoid colon, without evidence of diverticulitis. 8. Scattered calcification along the abdominal aorta and its branches. 9. Diffuse coronary artery calcifications seen.  Critical Value/emergent results were called by telephone at the time of interpretation on 09/15/2014 at 9:36 pm to the [facility] physician on call, who verbally acknowledged these results.   Electronically Signed   By: Larry [name] M.D.   On: 09/15/2014 21:48     Medications:     Scheduled Medications: . amiodarone  200 mg Oral Once  . amiodarone  200 mg Oral BID  . antiseptic oral rinse  7 mL Mouth Rinse BID  . atorvastatin  40 mg Oral q1800  . bisacodyl  5 mg Oral QHS  . cefTRIAXone (ROCEPHIN)  IV  1 g Intravenous Q24H  . chlorhexidine  15 mL Mouth/Throat BID  . darbepoetin (ARANESP) injection - DIALYSIS  100 mcg Intravenous Q Mon-HD  . DULoxetine  30 mg Oral Daily  . feeding supplement (NEPRO CARB STEADY)   237 mL Oral BID BM  . insulin aspart  0-9 Units Subcutaneous TID AC & HS  . levothyroxine  75 mcg Oral QAC breakfast  . multivitamin  1 tablet Oral QHS  . sildenafil  80 mg Oral TID  . sodium chloride irrigation  200 mL Irrigation Q2H while awake    Infusions: . sodium chloride Stopped (09/15/14 1600)  . DOBUTamine 2.5 mcg/kg/min (09/16/14 1900)  . norepinephrine (LEVOPHED) Adult infusion 18 mcg/min (09/16/14 2138)  . sodium chloride irrigation      PRN Medications: Place/Maintain arterial line **AND** sodium chloride, albuterol, ALPRAZolam, fentaNYL (SUBLIMAZE) injection, ondansetron (ZOFRAN) IV, oxyCODONE-acetaminophen **AND** oxyCODONE, sodium chloride, temazepam   Assessment:  1. Left Hip Fracture  s/p IM nail on 08/26/14 2. Chronic systolic CHF- Nonischemic cardiomyopathy, on home milrinone 0.5 mcg/kg/min, decreased to 0.25 here with NSVT. Became hypotensive, now on dobutamine 2.5.  3. Acute on chronic renal failure - suspect post-op ATN.  CVVH => HD.  4.  Paroxysmal atrial fibrillation requiring DCCV 02/2014, 04/2014- on chronic amio and xarelto prior to admission. CHADS VASc Score- 5.  Remains in NSR.  5. Diabetes mellitus type 2 6. Depression/anxiety 7. OSA, complex- Needs Bipap at night but had difficulty after dental procedure.  8 Pulmonary HTN - Mixed pulmonary venous hypertension and PAH- on Revatio 80 mg tid 9. Dental extractions 08/2014 by Dr Enrique Sack 10. Obesity- Body mass index is 32.39 kg/(m^2). 11. Probable chronic respiratory failure - requiring home O2 with ambulation as of this admission 12. Hyponatremia   13. NSVT 14. Delirium- Improved.  15. UTI- ceftriaxone 16. Anemia- renal disease/chronic disease.  Transfused 1 unit on 8/5.  17. Hemorrhagic shock with large rectus sheath hematoma 8/6  Plan/Discussion:    Appreciate vascular consult.  No surgery recommended at this time for retroperitoneal bleed.  Serial hemoglobins and daily anticoag checks until normal.   Will discuss resuming anticoag for afib with MD once levels normal. Off Heparin. INR 1.29 (last dose of coumadin 8/2).   Will continue to monitor fluid status closely.  Hgb 11.1 -> 10.6 -> 9.4 -> 9.1 (every 12 hours). Will follow closely.  May need additional blood. Had HD yesterday in room.  Dialyzed > 2L.  Only negative 0.7.  Weight shows up 14 lbs since blood products and fluid given.  Will leave timing of HD to renal.    MD had talk with pt and family concerning goals of care with pt requiring inotropes and HD. Currents goals of care: Aggressive care and Full code per family discussion 09/15/14  MD has discussed with [facility] and Renal. Will attend while in ICU with understanding that [facility] will resume care on transfer out of ICU.   Length of Stay: [address]Larry [name]" [place], [place] 09/17/2014 7:35 AM   Advanced Heart Failure Team Pager [phone] (M-F; 7a - 4p)  Please contact [facility] for night-coverage after hours (4p -7a ) and weekends on [email or web address]  Patient seen and examined with Larry Kilts, PA-C. We discussed all aspects of the encounter. I agree with the assessment and plan as stated above.   Overall improved norepi down to 10 however had long run NSVT last night. Will continue to wean levophed. Supp K and Mg. If recurs will need IV amio.   Hgb continues to drift down. Appreciate VVS input. Will continue with q12 H/H. No anticoagulants. Long talk about need to mobilize and wear SCDs to prevent DVT.   Continue HD.   Will keep off of dobutamine as we wean norepi. Watch co-ox. Will need to be off dobutamine to get outpatient HD.   The patient is critically ill with multiple organ systems failure and requires high complexity decision making for assessment and support, frequent evaluation and titration of therapies, application of advanced monitoring technologies and extensive interpretation of multiple databases.   Critical Care Time devoted to patient care services described in  this note is 35 Minutes.  Bensimhon, Daniel,MD 9:43 AM

## 2014-09-17 NOTE — Progress Notes (Signed)
S: Feels better today.  Tolerated HD well yest O:BP 119/42 mmHg  Pulse 78  Temp(Src) 97.9 F (36.6 C) (Oral)  Resp 16  Ht 5\' 10"  (1.778 m)  Wt 109.9 kg (242 lb 4.6 oz)  BMI 34.76 kg/m2  SpO2 98%  Intake/Output Summary (Last 24 hours) at 09/17/14 0826 Last data filed at 09/17/14 0600  Gross per 24 hour  Intake 1601.12 ml  Output   2400 ml  Net -798.88 ml   Weight change:  Gen: Awake and alert CVS: RRR Resp: clear ant Abd: + BS, hematoma Rt abd less firm Ext: + edema NEURO: CNI Ox3 no asterixis Rt IJ permcath   . amiodarone  200 mg Oral BID  . antiseptic oral rinse  7 mL Mouth Rinse BID  . atorvastatin  40 mg Oral q1800  . bisacodyl  5 mg Oral QHS  . cefTRIAXone (ROCEPHIN)  IV  1 g Intravenous Q24H  . chlorhexidine  15 mL Mouth/Throat BID  . darbepoetin (ARANESP) injection - DIALYSIS  100 mcg Intravenous Q Mon-HD  . DULoxetine  30 mg Oral Daily  . feeding supplement (NEPRO CARB STEADY)  237 mL Oral BID BM  . insulin aspart  0-9 Units Subcutaneous TID AC & HS  . levothyroxine  75 mcg Oral QAC breakfast  . magnesium sulfate 1 - 4 g bolus IVPB  2 g Intravenous Once  . multivitamin  1 tablet Oral QHS  . potassium chloride  20 mEq Oral Once  . sildenafil  80 mg Oral TID  . sodium chloride irrigation  200 mL Irrigation Q2H while awake   Ct Abdomen Pelvis Wo Contrast  09/15/2014   CLINICAL DATA:  Acute onset of severe abdominal pain. Initial encounter.  EXAM: CT ABDOMEN AND PELVIS WITHOUT CONTRAST  TECHNIQUE: Multidetector CT imaging of the abdomen and pelvis was performed following the standard protocol without IV contrast.  COMPARISON:  CT of the abdomen and pelvis performed 05/27/2014, and renal ultrasound performed 08/31/2014  FINDINGS: Small bilateral pleural effusions are noted, with bibasilar atelectasis and scattered calcifications likely reflecting calcified granulomata. Diffuse coronary artery calcifications are seen. A pacemaker lead is partially imaged.  There is a  very large right retroperitoneal hematoma, measuring 24.2 x 16.9 x 19.0 cm, containing a combination of acute and chronic hemorrhage. This appears partially contiguous with additional acute blood tracking superiorly along the retroperitoneum, and blood tracking about the IVC and the anterior aspect of Gerota's fascia. Trace blood tracks about the duodenum, which is displaced anteriorly by the large collection.  There appears to be some degree of hemorrhage within the right psoas muscle more inferiorly, with a somewhat unusual 4.3 x 3.3 x 2.6 cm low attenuation collection within the right psoas. This is not well assessed without contrast, but raises concern for underlying abscess within the right psoas. Alternatively, it could reflect unusual accumulation of chronic blood within the psoas.  There is also a small amount of intraperitoneal hemorrhage tracking along the the paracolic gutters bilaterally into the pelvis. A small amount of blood is seen tracking about the liver and spleen, and a small amount of lower attenuation fluid is seen tracking into a small umbilical hernia.  The liver and spleen are otherwise unremarkable in appearance. The gallbladder is distended but likely within normal limits. The pancreas and adrenal glands are unremarkable.  The very large retroperitoneal hematoma displaces the right kidney anteriorly. Nonspecific perinephric stranding is noted bilaterally. There is no evidence of hydronephrosis. No renal or ureteral stones  are seen.  No free fluid is identified. The small bowel is unremarkable in appearance. The stomach is within normal limits. No acute vascular abnormalities are seen. Scattered calcification is noted along the abdominal aorta and its branches.  The appendix is normal in caliber, without evidence of appendicitis. Scattered diverticulosis is noted along the descending and sigmoid colon, without evidence of diverticulitis.  The bladder is mildly distended. A small amount of  air within the bladder is of uncertain significance. Would correlate for recent Foley catheter placement. The prostate is borderline normal in size. No inguinal lymphadenopathy is seen.  No acute osseous abnormalities are identified. The patient's left hip hardware is grossly unremarkable, though incompletely imaged. A residual underlying left femoral intertrochanteric fracture line is partially imaged.  IMPRESSION: 1. Very large right retroperitoneal hematoma, measuring 24.2 x 16.9 x 19.0 cm, containing a combination of acute and chronic hemorrhage. This appears partially contiguous with additional acute blood tracking superiorly along the retroperitoneum, and blood tracking about the IVC and the anterior aspect of Gerota's fascia. Trace blood tracks about the duodenum. This likely reflects persistent hemorrhage status post the patient's prior cardiac catheterization on 08/21/2014. It may reflect some degree of injury to the IVC, given the presence of blood about the IVC and within the perineum. 2. Small amount of intraperitoneal hemorrhage tracks along the paracolic gutters bilaterally into the pelvis. Small amount of blood seen tracking about the liver and spleen. Small amount of lower attenuation fluid may reflect chronic blood, tracking into a small umbilical hernia. 3. Some degree of hemorrhage within the right psoas muscle more inferiorly. Associated unusual 4.3 x 3.3 x 2.6 cm low attenuation collection within the right psoas muscle. This is not well assessed without contrast, but raises concern for underlying abscess within the right psoas. Alternately, it could reflect unusual accumulation of chronic blood within the psoas. 4. Small bilateral pleural effusions, with bibasilar atelectasis and scattered associated calcifications likely reflecting calcified granulomata. 5. Anterior displacement of the right kidney and duodenum by the very large hematoma. 6. Small amount of air noted within the bladder, of  uncertain significance. Would correlate for recent Foley catheter placement. 7. Scattered diverticulosis along the descending and sigmoid colon, without evidence of diverticulitis. 8. Scattered calcification along the abdominal aorta and its branches. 9. Diffuse coronary artery calcifications seen.  Critical Value/emergent results were called by telephone at the time of interpretation on 09/15/2014 at 9:36 pm to the [facility]-Link physician on call, who verbally acknowledged these results.   Electronically Signed   By: Garald Balding M.D.   On: 09/15/2014 21:48   BMET    Component Value Date/Time   NA 131* 09/17/2014 0440   K 3.8 09/17/2014 0440   CL 97* 09/17/2014 0440   CO2 26 09/17/2014 0440   GLUCOSE 170* 09/17/2014 0440   BUN 21* 09/17/2014 0440   CREATININE 2.38* 09/17/2014 0440   CALCIUM 7.6* 09/17/2014 0440   GFRNONAA 28* 09/17/2014 0440   GFRAA 32* 09/17/2014 0440   CBC    Component Value Date/Time   WBC 11.8* 09/17/2014 0440   RBC 2.91* 09/17/2014 0440   HGB 9.1* 09/17/2014 0440   HCT 26.5* 09/17/2014 0440   PLT 121* 09/17/2014 0440   MCV 91.1 09/17/2014 0440   MCH 31.3 09/17/2014 0440   MCHC 34.3 09/17/2014 0440   RDW 16.6* 09/17/2014 0440   LYMPHSABS 0.9 08/30/2014 1744   MONOABS 1.0 08/30/2014 1744   EOSABS 0.2 08/30/2014 1744   BASOSABS 0.0 08/30/2014 1744  Assessment:  1. Acute on CKD 3, ? If at ESRD 2. Severe cardiomyopathy 3. Abd wall hematoma 4. SP Lt hip fx 5. Anemia on aranesp  Plan: 1. Plan HD today again today  Fischer Halley T

## 2014-09-17 NOTE — Progress Notes (Signed)
Pt had 50 beat run vtach at 0005, lasting >20 seconds.  Pt asympyomatic, ICD did not fire, pulse maintained. Paged MD Claiborne Billings. MD ordered mag and potassium labs. Will collect and continue to monitor pt.

## 2014-09-17 NOTE — Progress Notes (Signed)
Physical Therapy Treatment Patient Details Name: Larry [name] MRN: [id number] DOB: 09/22/1950 Today's Date: 09/17/2014    History of Present Illness Larry [name] is a 64 y.o. male who complains of left hip pain after sustaining a mechanical fall at home. Pt with left intertrochanteric fx s/p IM nail. PMHx NICM, CAD, DDD, back pain, CVA, obesity. 7/23 CRRT initiated, 7/31 CRRT discontinued.    PT Comments    Patient eager to participate in PT today, on bed rest due to levophed dosage. Patient able to participate in UE and LE exercises to maintain mobility and strength as listed below. Patient will continue to benefit from PT to mobilize as medically tolerated and to progress patient towards goals once stable.  Follow Up Recommendations  CIR     Equipment Recommendations  Other (comment) (TBA)    Recommendations for Other Services       Precautions / Restrictions Precautions Precautions: Other (comment) (Bed Rest) Restrictions Weight Bearing Restrictions: No LLE Weight Bearing: Weight bearing as tolerated    Mobility  Bed Mobility               General bed mobility comments: No bed mobility performed due to medications  Transfers                    Ambulation/Gait                 Stairs            Wheelchair Mobility    Modified Rankin (Stroke Patients Only)       Balance                                    Cognition Arousal/Alertness: Awake/alert Behavior During Therapy: WFL for tasks assessed/performed Overall Cognitive Status: Within Functional Limits for tasks assessed                      Exercises General Exercises - Upper Extremity Shoulder Flexion: AROM;Both;10 reps;Supine Digit Composite Flexion: AROM;Strengthening;10 reps;Supine (Squeezing towel roll) Composite Extension: AROM;Both;10 reps;Supine General Exercises - Lower Extremity Ankle Circles/Pumps: AROM;Both;Other reps (comment);Supine  (Ankle ABC's) Heel Slides: AAROM;Both;5 reps;Supine Straight Leg Raises: AAROM;Both;5 reps;Supine    General Comments        Pertinent Vitals/Pain Pain Assessment: 0-10 Pain Score: 5  Pain Location: back pain after drainage. Pain Descriptors / Indicators: Constant;Aching Pain Intervention(s): Monitored during session    Home Living                      Prior Function            PT Goals (current goals can now be found in the care plan section) Acute Rehab PT Goals PT Goal Formulation: With patient Time For Goal Achievement: 09/24/14 Potential to Achieve Goals: Good Progress towards PT goals: Not progressing toward goals - comment (Due to medical complications.)    Frequency  Min 5X/week    PT Plan Current plan remains appropriate    Co-evaluation             End of Session   Activity Tolerance: Patient limited by fatigue;Treatment limited secondary to medical complications (Comment) Patient left: in bed;with bed alarm set;with call bell/phone within reach     Time: 1020-1038 PT Time Calculation (min) (ACUTE ONLY): 18 min  Charges:  $Therapeutic Exercise: 8-22 mins  G CodesRoanna Epley, SPT [phone]  09/17/2014, 11:29 AM

## 2014-09-18 DIAGNOSIS — I482 Chronic atrial fibrillation: Secondary | ICD-10-CM

## 2014-09-18 LAB — CBC
HCT: 24.2 % — ABNORMAL LOW (ref 39.0–52.0)
HCT: 24.9 % — ABNORMAL LOW (ref 39.0–52.0)
HEMATOCRIT: 30.2 % — AB (ref 39.0–52.0)
HEMOGLOBIN: 8 g/dL — AB (ref 13.0–17.0)
Hemoglobin: 7.8 g/dL — ABNORMAL LOW (ref 13.0–17.0)
Hemoglobin: 9.8 g/dL — ABNORMAL LOW (ref 13.0–17.0)
MCH: 30.5 pg (ref 26.0–34.0)
MCH: 31 pg (ref 26.0–34.0)
MCH: 30.8 pg (ref 26.0–34.0)
MCHC: 32.2 g/dL (ref 30.0–36.0)
MCHC: 32.1 g/dL (ref 30.0–36.0)
MCHC: 32.5 g/dL (ref 30.0–36.0)
MCV: 94.5 fL (ref 78.0–100.0)
MCV: 96.5 fL (ref 78.0–100.0)
MCV: 95 fL (ref 78.0–100.0)
PLATELETS: 83 10*3/uL — AB (ref 150–400)
PLATELETS: 91 10*3/uL — AB (ref 150–400)
Platelets: 83 10*3/uL — ABNORMAL LOW (ref 150–400)
RBC: 2.56 MIL/uL — AB (ref 4.22–5.81)
RBC: 2.58 MIL/uL — AB (ref 4.22–5.81)
RBC: 3.18 MIL/uL — AB (ref 4.22–5.81)
RDW: 17.1 % — AB (ref 11.5–15.5)
RDW: 17.3 % — ABNORMAL HIGH (ref 11.5–15.5)
RDW: 18.6 % — ABNORMAL HIGH (ref 11.5–15.5)
WBC: 7.1 10*3/uL (ref 4.0–10.5)
WBC: 7.5 10*3/uL (ref 4.0–10.5)
WBC: 7.3 10*3/uL (ref 4.0–10.5)

## 2014-09-18 LAB — CARBOXYHEMOGLOBIN
CARBOXYHEMOGLOBIN: 2 % — AB (ref 0.5–1.5)
METHEMOGLOBIN: 0.9 % (ref 0.0–1.5)
O2 Saturation: 74.4 %
Total hemoglobin: 10.6 g/dL — ABNORMAL LOW (ref 13.5–18.0)

## 2014-09-18 LAB — GLUCOSE, CAPILLARY
GLUCOSE-CAPILLARY: 144 mg/dL — AB (ref 65–99)
Glucose-Capillary: 125 mg/dL — ABNORMAL HIGH (ref 65–99)
Glucose-Capillary: 130 mg/dL — ABNORMAL HIGH (ref 65–99)
Glucose-Capillary: 109 mg/dL — ABNORMAL HIGH (ref 65–99)
Glucose-Capillary: 80 mg/dL (ref 65–99)

## 2014-09-18 LAB — BASIC METABOLIC PANEL
Anion gap: 6 (ref 5–15)
BUN: 22 mg/dL — AB (ref 6–20)
CALCIUM: 7.6 mg/dL — AB (ref 8.9–10.3)
CO2: 28 mmol/L (ref 22–32)
CREATININE: 2.55 mg/dL — AB (ref 0.61–1.24)
Chloride: 100 mmol/L — ABNORMAL LOW (ref 101–111)
GFR, EST AFRICAN AMERICAN: 29 mL/min — AB (ref >60)
GFR, EST NON AFRICAN AMERICAN: 25 mL/min — AB (ref >60)
Glucose, Bld: 143 mg/dL — ABNORMAL HIGH (ref 65–99)
Potassium: 4.1 mmol/L (ref 3.5–5.1)
Sodium: 134 mmol/L — ABNORMAL LOW (ref 135–145)

## 2014-09-18 LAB — PROTIME-INR
INR: 1.28 (ref 0.00–1.49)
Prothrombin Time: 16.1 seconds — ABNORMAL HIGH (ref 11.6–15.2)

## 2014-09-18 LAB — PREPARE RBC (CROSSMATCH)

## 2014-09-18 LAB — APTT: APTT: 38 s — AB (ref 24–37)

## 2014-09-18 MED ORDER — SODIUM CHLORIDE 0.9 % IV SOLN
Freq: Once | INTRAVENOUS | Status: AC
  Administered 2014-09-18: 10:00:00 via INTRAVENOUS

## 2014-09-18 MED ORDER — SORBITOL 70 % SOLN
30.0000 mL | Freq: Once | Status: AC
  Administered 2014-09-18: 30 mL via ORAL
  Filled 2014-09-18: qty 30

## 2014-09-18 NOTE — Progress Notes (Signed)
2 units of RBC's given with Hemodialysis by HD nurse.  Roxan Hockey, RN

## 2014-09-18 NOTE — Progress Notes (Signed)
DoBUTamine drip titrated to OFF. Titrated to off prior to hemodialysis being started. Will monitor BP closely during HD.    Roxan Hockey, RN

## 2014-09-18 NOTE — Progress Notes (Signed)
Patient ID: Larry [name], male   DOB: 12/28/1950, 64 y.o.   MRN: [id number] Advanced Heart Failure Rounding Note   Subjective:    Larry [name] is a 64 y/o man with history of chronic systolic CHF (nonischemic cardiomyopathy) s/p 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 He is on chronic milrinone 0.5 mcg.  Prior to admit he was being considered for LVAD.   Dischaged 7/16 after dental procedures. Also had RHC with elevate pulmonary pressures. Revatio was increased to 80 mg tid during that stay. Admitted with fall---> broken L  hip . 7/18 had IM Nail L femur.  7/23 started CVVHD, CVVH stopped 7/31.  8/6 began having severe right ab/flank pain and was found to have large rectus sheath hematoma 8/7 with transfer to ICU.  Says he continues to feel better and like his swelling is coming down.  Still very tender.  Weaned off levophed this morning.  Weight down 6 lbs after dialysis 244 -> 238.  CVP 10.   Objective:   Weight Range:  Vital Signs:   Temp:  [98.2 F (36.8 C)-98.5 F (36.9 C)] 98.5 F (36.9 C) (08/09 1905) Pulse Rate:  [67-79] 70 (08/10 0700) Resp:  [11-22] 11 (08/10 0700) BP: (90-116)/(33-67) 115/38 mmHg (08/09 1905) SpO2:  [99 %-100 %] 100 % (08/10 0700) Arterial Line BP: (101-132)/(38-62) 122/42 mmHg (08/10 0700) Weight:  [238 lb 15.7 oz (108.4 kg)-244 lb 11.4 oz (111 kg)] 238 lb 15.7 oz (108.4 kg) (08/09 1905) Last BM Date: 09/13/14  Weight change: Filed Weights   09/16/14 1614 09/17/14 1445 09/17/14 1905  Weight: 242 lb 4.6 oz (109.9 kg) 244 lb 11.4 oz (111 kg) 238 lb 15.7 oz (108.4 kg)    Intake/Output:   Intake/Output Summary (Last 24 hours) at 09/18/14 0740 Last data filed at 09/18/14 0600  Gross per 24 hour  Intake 1274.65 ml  Output   2730 ml  Net -1455.35 ml     Physical Exam: CVP 10 General:   lying in bed NAD HEENT: normal Neck: supple. JVP 7. Tunneled HD catheter.   Carotids 2+ bilat; no bruits. No lymphadenopathy or thryomegaly appreciated. Cor: PMI nondisplaced. Regular rate & rhythm. No rubs, gallops or murmurs. Lungs: clear Abdomen: soft, nondistended. Large hematoma over right abdominal wall. Very tender to palpation. No rebound. + bowel sounds. Extremities: no cyanosis, clubbing, rash. Trace- 1 + peripheral edema Neuro: Alert/oriented in pain  Telemetry: NSR 70s occasional V pacing.   Labs: Basic Metabolic Panel:  Recent Labs Lab 09/12/14 0630  09/15/14 0306 09/15/14 2243 09/16/14 0440 09/17/14 0035 09/17/14 0440 09/18/14 0317  NA 125*  < > 132* 127* 128* 132* 131* 134*  K 4.8  < > 4.3 4.5 4.5 3.7 3.8 4.1  CL 90*  < > 96* 94* 93* 99* 97* 100*  CO2 24  < > 26 23 22 26 26 28   GLUCOSE 129*  < > 120* 215* 204* 166* 170* 143*  BUN 45*  < > 25* 35* 40* 20 21* 22*  CREATININE 4.87*  < > 2.66* 3.48* 3.68* 2.30* 2.38* 2.55*  CALCIUM 8.6*  < > 8.0* 7.6* 7.8* 7.6* 7.6* 7.6*  MG 2.2  --  2.0  --   --  2.0 1.9  --   PHOS 6.0*  --   --   --   --   --  3.9  --   < > = values in  this interval not displayed.  Liver Function Tests:  Recent Labs Lab 09/12/14 0630  ALBUMIN 2.5*   No results for input(s): LIPASE, AMYLASE in the last 168 hours. No results for input(s): AMMONIA in the last 168 hours.  CBC:  Recent Labs Lab 09/15/14 2110  09/16/14 0440 09/16/14 0915 09/16/14 2120 09/17/14 0440 09/18/14 0317 09/18/14 0635  WBC 14.1*  --  17.5*  --   --  11.8* 7.1 7.5  HGB 10.9*  < > 11.1* 10.6* 9.4* 9.1* 7.8* 8.0*  HCT 31.9*  < > 32.0* 30.5* 27.3* 26.5* 24.2* 24.9*  MCV 88.4  --  87.2  --   --  91.1 94.5 96.5  PLT 131*  --  151  --   --  121* 83* 91*  < > = values in this interval not displayed.  Cardiac Enzymes: No results for input(s): CKTOTAL, CKMB, CKMBINDEX, TROPONINI in the last 168 hours.  BNP: BNP (last 3 results)  Recent Labs  05/22/14 1029 05/27/14 1126 06/19/14 1030  BNP 935.2* 1627.8* 738.9*    ProBNP (last 3  results)  Recent Labs  10/09/13 0926 11/14/13 0926 01/14/14 1112  PROBNP 1561.0* 1261.0* 5769.0*      Other results:  Imaging: No results found.   Medications:     Scheduled Medications: . amiodarone  200 mg Oral BID  . antiseptic oral rinse  7 mL Mouth Rinse BID  . atorvastatin  40 mg Oral q1800  . bisacodyl  5 mg Oral QHS  . chlorhexidine  15 mL Mouth/Throat BID  . darbepoetin (ARANESP) injection - DIALYSIS  100 mcg Intravenous Q Mon-HD  . DULoxetine  30 mg Oral Daily  . feeding supplement (NEPRO CARB STEADY)  237 mL Oral BID BM  . insulin aspart  0-9 Units Subcutaneous TID AC & HS  . levothyroxine  75 mcg Oral QAC breakfast  . multivitamin  1 tablet Oral QHS  . sildenafil  80 mg Oral TID    Infusions: . sodium chloride Stopped (09/15/14 1600)  . DOBUTamine 2.5 mcg/kg/min (09/17/14 2224)  . norepinephrine (LEVOPHED) Adult infusion 1 mcg/min (09/18/14 0725)  . sodium chloride irrigation      PRN Medications: Place/Maintain arterial line **AND** sodium chloride, albuterol, ALPRAZolam, fentaNYL (SUBLIMAZE) injection, ondansetron (ZOFRAN) IV, oxyCODONE-acetaminophen **AND** oxyCODONE, sodium chloride, temazepam   Assessment:  1. Left Hip Fracture s/p IM nail on 08/26/14 2. Chronic systolic CHF- Nonischemic cardiomyopathy, on home milrinone 0.5 mcg/kg/min, decreased to 0.25 here with NSVT. Became hypotensive, now on dobutamine 2.5.  3. Acute on chronic renal failure - suspect post-op ATN.  CVVH => HD.  4.  Paroxysmal atrial fibrillation requiring DCCV 02/2014, 04/2014- on chronic amio and xarelto prior to admission. CHADS VASc Score- 5.  Remains in NSR.  5. Diabetes mellitus type 2 6. Depression/anxiety 7. OSA, complex- Needs Bipap at night but had difficulty after dental procedure.  8 Pulmonary HTN - Mixed pulmonary venous hypertension and PAH- on Revatio 80 mg tid 9. Dental extractions 08/2014 by Dr Enrique Sack 10. Obesity- Body mass index is 32.39 kg/(m^2). 11.  Probable chronic respiratory failure - requiring home O2 with ambulation as of this admission 12. Hyponatremia   13. NSVT 14. Delirium- Improved.  15. UTI- ceftriaxone 16. Anemia- renal disease/chronic disease.  Transfused 1 unit on 8/5.  17. Hemorrhagic shock with massive retroperitoneal hematoma 8/6  Plan/Discussion:    Appreciate vascular consult.  No surgery recommended at this time.  Will discuss resuming anticoag for afib once levels normal. Off  Heparin. INR 1.28 (last dose of coumadin 8/2).   Will continue to monitor fluid status closely.  Hgb 11.1 -> 10.6 -> 9.4 -> 9.1 -> 7.8 -> 8.0 (q12hrs) Will follow closely.  Will need additional blood if continues to trend down. Had HD yesterday in room.  Dialyzed 2.7 L.  Weight down 6 lbs.  Will leave HD timing to Renal.   Says he would prefer quiet during dialysis.  Tech yesterday made him quite anxious.  ART line and Cuff pressures are discrepant, with the cuff SBPs in 90-80s and ART line 110s.  Currents goals of care: Aggressive care and Full code per family discussion 09/15/14  Length of Stay: [address]" [place], [place] 09/18/2014 7:40 AM  Advanced Heart Failure Team Pager [phone] (M-F; 7a - 4p)  Please contact [facility] for night-coverage after hours (4p -7a ) and weekends on [email or web address]  Patient seen and examined with Oda Kilts, PA-C. We discussed all aspects of the encounter. I agree with the assessment and plan as stated above.   BP improved. Now off norepi. Unfortunately Hgb continues to drop. Will touch base with VVS again. Transfuse 2 more units RBCs. Hopefully co-ox will remain stable off inotropes. Keep in CCU for now. Continue SCDs as unable to get any anti-coag for DVT prophylaxis.   The patient is critically ill with multiple organ systems failure and requires high complexity decision making for assessment and support, frequent evaluation and titration of therapies, application of advanced monitoring  technologies and extensive interpretation of multiple databases.   Critical Care Time devoted to patient care services described in this note is 35 Minutes.  Hassie Mandt,MD 8:24 AM

## 2014-09-18 NOTE — Progress Notes (Signed)
PT Cancellation Note  Patient Details Name: Larry [name] MRN: [id number] DOB: 1950/08/09   Cancelled Treatment:    Reason Eval/Treat Not Completed: Patient at procedure or test/unavailable (HD in room.  Will defer PT until tomorrow as OT to check back in pm today.)Thanks.    Irwin Brakeman F 09/18/2014, 11:54 AM  [facility] [phone] (pager)

## 2014-09-18 NOTE — Care Management Note (Signed)
Case Management Note  Patient Details  Name: Larry [name] MRN: [id number] Date of Birth: [birth year]  Subjective/Objective:     64 y.o. M who was admitted after a fall 08/26/14 IM  Nail  L hip. Developed Rectus Sheath Hematoma for which he has been in ICU since. Levophed/Dobutamine drips stopped 09/18/2014. Cir is following for possible admission.            Action/Plan:Will continue to follow for final disposition.   Expected Discharge Date:                  Expected Discharge Plan:     In-House Referral:     Discharge planning Services     Post Acute Care Choice:    Choice offered to:     DME Arranged:    DME Agency:     HH Arranged:    [place] Agency:     Status of Service:     Medicare Important Message Given:    Date Medicare IM Given:    Medicare IM give by:    Date Additional Medicare IM Given:    Additional Medicare Important Message give by:     If discussed at [place] of Stay Meetings, dates discussed:    Additional Comments:  Delrae Sawyers, RN 09/18/2014, 11:32 AM

## 2014-09-18 NOTE — Progress Notes (Signed)
Patient up to chair. Two person assist to chair. Patient weak, but able to do about 50% of movement on his on. Patient transferred to chair by "stand a pivot approach". Patient tolerated mobility well. Will continue to support and encourage mobility.

## 2014-09-18 NOTE — Progress Notes (Signed)
S: Eating better.  Did well with HD yest and 2.6L removed O:BP 115/38 mmHg  Pulse 70  Temp(Src) 98.5 F (36.9 C) (Oral)  Resp 11  Ht 5\' 10"  (1.778 m)  Wt 108.4 kg (238 lb 15.7 oz)  BMI 34.29 kg/m2  SpO2 100%  Intake/Output Summary (Last 24 hours) at 09/18/14 2458 Last data filed at 09/18/14 0725  Gross per 24 hour  Intake 1013.25 ml  Output   2780 ml  Net -1766.75 ml   Weight change: -0.8 kg (-1 lb 12.2 oz) Gen: Awake and alert CVS: RRR Resp: clear ant Abd: + BS, hematoma Rt abd less firm Ext: + edema NEURO: CNI Ox3 no asterixis Rt IJ permcath   . amiodarone  200 mg Oral BID  . antiseptic oral rinse  7 mL Mouth Rinse BID  . atorvastatin  40 mg Oral q1800  . bisacodyl  5 mg Oral QHS  . chlorhexidine  15 mL Mouth/Throat BID  . darbepoetin (ARANESP) injection - DIALYSIS  100 mcg Intravenous Q Mon-HD  . DULoxetine  30 mg Oral Daily  . feeding supplement (NEPRO CARB STEADY)  237 mL Oral BID BM  . insulin aspart  0-9 Units Subcutaneous TID AC & HS  . levothyroxine  75 mcg Oral QAC breakfast  . multivitamin  1 tablet Oral QHS  . sildenafil  80 mg Oral TID   No results found. BMET    Component Value Date/Time   NA 134* 09/18/2014 0317   K 4.1 09/18/2014 0317   CL 100* 09/18/2014 0317   CO2 28 09/18/2014 0317   GLUCOSE 143* 09/18/2014 0317   BUN 22* 09/18/2014 0317   CREATININE 2.55* 09/18/2014 0317   CALCIUM 7.6* 09/18/2014 0317   GFRNONAA 25* 09/18/2014 0317   GFRAA 29* 09/18/2014 0317   CBC    Component Value Date/Time   WBC 7.5 09/18/2014 0635   RBC 2.58* 09/18/2014 0635   HGB 8.0* 09/18/2014 0635   HCT 24.9* 09/18/2014 0635   PLT 91* 09/18/2014 0635   MCV 96.5 09/18/2014 0635   MCH 31.0 09/18/2014 0635   MCHC 32.1 09/18/2014 0635   RDW 17.3* 09/18/2014 0635   LYMPHSABS 0.9 08/30/2014 1744   MONOABS 1.0 08/30/2014 1744   EOSABS 0.2 08/30/2014 1744   BASOSABS 0.0 08/30/2014 1744     Assessment:  1. Acute on CKD 3, ? If at ESRD, suspect he is as  UO minimal 2. Severe cardiomyopathy 3. Abd wall hematoma 4. SP Lt hip fx 5. Anemia on aranesp  Plan: 1. Plan HD today again today  Malcolm Quast T

## 2014-09-18 NOTE — Progress Notes (Signed)
OT Cancellation Note  Patient Details Name: Larry [name] MRN: [id number] DOB: 10-11-1950   Cancelled Treatment:    Reason Eval/Treat Not Completed: Medical issues which prohibited therapy--HD. Will attempt later as schedule allows.  [name] 09/18/2014, 12:20 PM

## 2014-09-18 NOTE — Progress Notes (Signed)
Nutrition Follow-up  DOCUMENTATION CODES:   Obesity unspecified  INTERVENTION:   Continue to encourage PO intake.    NUTRITION DIAGNOSIS:   Increased nutrient needs related to wound healing as evidenced by estimated needs.  ongoing  GOAL:   Patient will meet greater than or equal to 90% of their needs  Met  MONITOR:   PO intake, Labs, Weight trends, I & O's, Skin  REASON FOR ASSESSMENT:   Consult Assessment of nutrition requirement/status  ASSESSMENT:   64 y.o. Male who complains of left hip pain after sustaining a mechanical fall at home this morning. He presented to the ER for evaluation after being unable to ambulate without pain. Prior to the fall, the patient was abulatory without any assistive devices.   Pt developed rectus sheath hematoma and was transferred to ICU. Pt continues to eat 100% of his meals.   Diet Order:  Diet Carb Modified Fluid consistency:: Thin; Room service appropriate?: Yes  Skin:  Wound (see comment) (stage 2 sacrum, stage I heel, Deep tissue injury heel)  Last BM:  8/5  Height:   Ht Readings from Last 1 Encounters:  09/09/14 _0  (1.778 m)    Weight:   Wt Readings from Last 1 Encounters:  09/18/14 237 lb 7 oz (107.7 kg)    Ideal Body Weight:  75.4 kg  BMI:  Body mass index is 34.07 kg/(m^2).  Estimated Nutritional Needs:   Kcal:  2000-2200  Protein:  105-115 gm  Fluid:  2.0-2.2 L  EDUCATION NEEDS:   No education needs identified at this time  [place], [place], [place] Pager [phone] After Hours Pager

## 2014-09-18 NOTE — Consult Note (Signed)
Pt clinically improved off levophed, less pain   Filed Vitals:   09/18/14 0700 09/18/14 0711 09/18/14 0720 09/18/14 0800  BP:  91/38 88/38   Pulse: 70 73  71  Temp:    98.4 F (36.9 C)  TempSrc:    Oral  Resp: 11 15  9   Height:      Weight:      SpO2: 100% 100%  100%   Abdomen still tender to palpation across right side and flank  CBC    Component Value Date/Time   WBC 7.5 09/18/2014 0635   RBC 2.58* 09/18/2014 0635   HGB 8.0* 09/18/2014 0635   HCT 24.9* 09/18/2014 0635   PLT 91* 09/18/2014 0635   MCV 96.5 09/18/2014 0635   MCH 31.0 09/18/2014 0635   MCHC 32.1 09/18/2014 0635   RDW 17.3* 09/18/2014 0635   LYMPHSABS 0.9 08/30/2014 1744   MONOABS 1.0 08/30/2014 1744   EOSABS 0.2 08/30/2014 1744   BASOSABS 0.0 08/30/2014 1744    A:  Retroperitoneal hematoma more hemodynamically stable at this point.  Hemoglobin still drifting currently.  With operation pt will be at huge risk of high morbidity and mortality in light of his overall clinical situation and overall outcomes of operating on spontaneous RP hematomas.  Would continue to optimize his coagulation profile.  Consider platelet trasfusion as this has been chronically low as well as borderline elevation of PT PTT which could be treated with FFP  Ruta Hinds, MD [facility] Office: [phone] Pager: [phone]

## 2014-09-19 LAB — BASIC METABOLIC PANEL
ANION GAP: 6 (ref 5–15)
BUN: 18 mg/dL (ref 6–20)
CO2: 30 mmol/L (ref 22–32)
Calcium: 7.7 mg/dL — ABNORMAL LOW (ref 8.9–10.3)
Chloride: 99 mmol/L — ABNORMAL LOW (ref 101–111)
Creatinine, Ser: 2.58 mg/dL — ABNORMAL HIGH (ref 0.61–1.24)
GFR, EST AFRICAN AMERICAN: 29 mL/min — AB (ref >60)
GFR, EST NON AFRICAN AMERICAN: 25 mL/min — AB (ref >60)
Glucose, Bld: 130 mg/dL — ABNORMAL HIGH (ref 65–99)
Potassium: 4.4 mmol/L (ref 3.5–5.1)
SODIUM: 135 mmol/L (ref 135–145)

## 2014-09-19 LAB — TYPE AND SCREEN
ABO/RH(D): A POS
ANTIBODY SCREEN: NEGATIVE
UNIT DIVISION: 0
Unit division: 0

## 2014-09-19 LAB — CBC
HCT: 29.6 % — ABNORMAL LOW (ref 39.0–52.0)
HCT: 30.9 % — ABNORMAL LOW (ref 39.0–52.0)
HEMOGLOBIN: 10 g/dL — AB (ref 13.0–17.0)
Hemoglobin: 9.5 g/dL — ABNORMAL LOW (ref 13.0–17.0)
MCH: 30.6 pg (ref 26.0–34.0)
MCH: 31.4 pg (ref 26.0–34.0)
MCHC: 32.1 g/dL (ref 30.0–36.0)
MCHC: 32.4 g/dL (ref 30.0–36.0)
MCV: 95.5 fL (ref 78.0–100.0)
MCV: 97.2 fL (ref 78.0–100.0)
PLATELETS: 104 10*3/uL — AB (ref 150–400)
Platelets: 137 10*3/uL — ABNORMAL LOW (ref 150–400)
RBC: 3.1 MIL/uL — ABNORMAL LOW (ref 4.22–5.81)
RBC: 3.18 MIL/uL — ABNORMAL LOW (ref 4.22–5.81)
RDW: 19.1 % — AB (ref 11.5–15.5)
RDW: 19.2 % — ABNORMAL HIGH (ref 11.5–15.5)
WBC: 7.4 10*3/uL (ref 4.0–10.5)
WBC: 8.6 10*3/uL (ref 4.0–10.5)

## 2014-09-19 LAB — GLUCOSE, CAPILLARY
GLUCOSE-CAPILLARY: 123 mg/dL — AB (ref 65–99)
GLUCOSE-CAPILLARY: 165 mg/dL — AB (ref 65–99)
GLUCOSE-CAPILLARY: 147 mg/dL — AB (ref 65–99)
GLUCOSE-CAPILLARY: 113 mg/dL — AB (ref 65–99)

## 2014-09-19 LAB — CARBOXYHEMOGLOBIN
CARBOXYHEMOGLOBIN: 2.6 % — AB (ref 0.5–1.5)
Methemoglobin: 0.9 % (ref 0.0–1.5)
O2 Saturation: 69.5 %
Total hemoglobin: 10.8 g/dL — ABNORMAL LOW (ref 13.5–18.0)

## 2014-09-19 LAB — PROTIME-INR
INR: 1.18 (ref 0.00–1.49)
Prothrombin Time: 15.1 seconds (ref 11.6–15.2)

## 2014-09-19 LAB — APTT: aPTT: 33 seconds (ref 24–37)

## 2014-09-19 MED ORDER — SODIUM CHLORIDE 0.9 % IV SOLN
510.0000 mg | Freq: Once | INTRAVENOUS | Status: AC
  Administered 2014-09-19: 510 mg via INTRAVENOUS
  Filled 2014-09-19: qty 17

## 2014-09-19 NOTE — Consult Note (Signed)
[facility]  Subjective  - Feels a little better today   Objective 94/45 72 97.8 F (36.6 C) (Oral) 17 96%  Intake/Output Summary (Last 24 hours) at 09/19/14 7473 Last data filed at 09/19/14 0600  Gross per 24 hour  Intake 1852.3 ml  Output   3000 ml  Net -1147.7 ml   Ecchymosis on abdomen no real change  Assessment/Planning: Retroperitoneal hematoma PT/PTT now normal Still with thrombocytopenia Continue to trend hemoglobin although clinically he seems to be improving overall If any evidence of ongoing bleeding would fully correct platelets  Ruta Hinds 09/19/2014 9:04 AM --  Laboratory Lab Results:  Recent Labs  09/18/14 1651 09/19/14 0300  WBC 7.3 7.4  HGB 9.8* 9.5*  HCT 30.2* 29.6*  PLT 83* 104*   BMET  Recent Labs  09/18/14 0317 09/19/14 0300  NA 134* 135  K 4.1 4.4  CL 100* 99*  CO2 28 30  GLUCOSE 143* 130*  BUN 22* 18  CREATININE 2.55* 2.58*  CALCIUM 7.6* 7.7*    COAG Lab Results  Component Value Date   INR 1.18 09/19/2014   INR 1.28 09/18/2014   INR 1.29 09/17/2014   No results found for: PTT

## 2014-09-19 NOTE — Consult Note (Addendum)
WOC wound follow-up consult note Wound type: Buttocks with red partial thickness abrasion; decreased in size since previous assessment .5X.5X.1cm, red and dry, no odor or drainage. This is NOT a pressure ulcer, it is not located over a bony prominence and has irregular edges, appearance consistent with probable shear.  Area between buttocks darker in color with intact skin, no open wound, odor or drainage has evolved since previous assessment.  Wife at bedside assessed area during consult and discussed plan of care.  Foam dressing in place to protect site.  Pt is now getting OOB and reviewed pressure reducing strategies. Please re-consult if further assistance is needed. Thank-you,  Julien Girt MSN, [place], [place], [place], [place]

## 2014-09-19 NOTE — Progress Notes (Signed)
Utilization review completed.  

## 2014-09-19 NOTE — Progress Notes (Signed)
S: Sitting in chair.  Pulled 3l HD O:BP 94/45 mmHg  Pulse 72  Temp(Src) 97.8 F (36.6 C) (Oral)  Resp 17  Ht 5\' 10"  (1.778 m)  Wt 107.7 kg (237 lb 7 oz)  BMI 34.07 kg/m2  SpO2 96%  Intake/Output Summary (Last 24 hours) at 09/19/14 0805 Last data filed at 09/19/14 0600  Gross per 24 hour  Intake   1857 ml  Output   3000 ml  Net  -1143 ml   Weight change: -1.6 kg (-3 lb 8.4 oz) Gen: Awake and alert CVS: RRR Resp: basilar crackles Abd: + BS, NDNT Ext: 1+ edema NEURO: CNI Ox3 no asterixis Rt IJ permcath Rt Arm PICC   . amiodarone  200 mg Oral BID  . atorvastatin  40 mg Oral q1800  . bisacodyl  5 mg Oral QHS  . chlorhexidine  15 mL Mouth/Throat BID  . darbepoetin (ARANESP) injection - DIALYSIS  100 mcg Intravenous Q Mon-HD  . DULoxetine  30 mg Oral Daily  . feeding supplement (NEPRO CARB STEADY)  237 mL Oral BID BM  . insulin aspart  0-9 Units Subcutaneous TID AC & HS  . levothyroxine  75 mcg Oral QAC breakfast  . multivitamin  1 tablet Oral QHS  . sildenafil  80 mg Oral TID   No results found. BMET    Component Value Date/Time   NA 135 09/19/2014 0300   K 4.4 09/19/2014 0300   CL 99* 09/19/2014 0300   CO2 30 09/19/2014 0300   GLUCOSE 130* 09/19/2014 0300   BUN 18 09/19/2014 0300   CREATININE 2.58* 09/19/2014 0300   CALCIUM 7.7* 09/19/2014 0300   GFRNONAA 25* 09/19/2014 0300   GFRAA 29* 09/19/2014 0300   CBC    Component Value Date/Time   WBC 7.4 09/19/2014 0300   RBC 3.10* 09/19/2014 0300   HGB 9.5* 09/19/2014 0300   HCT 29.6* 09/19/2014 0300   PLT 104* 09/19/2014 0300   MCV 95.5 09/19/2014 0300   MCH 30.6 09/19/2014 0300   MCHC 32.1 09/19/2014 0300   RDW 19.1* 09/19/2014 0300   LYMPHSABS 0.9 08/30/2014 1744   MONOABS 1.0 08/30/2014 1744   EOSABS 0.2 08/30/2014 1744   BASOSABS 0.0 08/30/2014 1744     Assessment:  1. New ESRD 2. Severe cardiomyopathy 3. Abd wall hematoma 4. SP Lt hip fx 5. Anemia on aranesp, did not get full course of IV  iron so will redose  Plan: 1. IV feraheme 2. Work with PT 3. Plan HD tomorrow and sat up in HD unit  [place] Peden T

## 2014-09-19 NOTE — Progress Notes (Signed)
Physical Therapy Treatment Patient Details Name: Larry [name] MRN: [id number] DOB: 1950/07/11 Today's Date: 09/19/2014    History of Present Illness Larry [name] is a 64 y.o. male who complains of left hip pain after sustaining a mechanical fall at home. Pt with left intertrochanteric fx s/p IM nail. PMHx NICM, CAD, DDD, back pain, CVA, obesity. 7/23 CRRT initiated, 7/31 CRRT discontinued.    PT Comments    Patient seated in recliner and agreeable to participate in PT today. Patient was able to ambulate and transfer as described below. See Vitals below to see pertinent vitals during session. He reports performing both UE and LE exercises on a regular basis multiple times per day. Patient will benefit from continued PT to address decreased endurance with ambulation and transfer independence when patient is medically stable.     Follow Up Recommendations  CIR     Equipment Recommendations  Other (comment) (TBA as patient progresses)    Recommendations for Other Services       Precautions / Restrictions Precautions Precautions: Fall Restrictions Weight Bearing Restrictions: No LLE Weight Bearing: Weight bearing as tolerated    Mobility  Bed Mobility               General bed mobility comments: Patient found seated in recliner.  Transfers Overall transfer level: Needs assistance Equipment used: Rolling walker (2 wheeled) Transfers: Sit to/from Stand Sit to Stand: Mod assist;+2 physical assistance;+2 safety/equipment         General transfer comment: Patient needed Mod A x 2 for initial sit to stand + cues for using chair arm rests to push up, was able to use only Mod A x1 for subsequent sit to stand after rest break during ambulation.  Ambulation/Gait Ambulation/Gait assistance: +2 safety/equipment;Min guard Ambulation Distance (Feet): 60 Feet Assistive device: Rolling walker (2 wheeled) Gait Pattern/deviations: Step-through pattern;Shuffle;Decreased stride  length;Narrow base of support Gait velocity: Decreased Gait velocity interpretation: Below normal speed for age/gender General Gait Details: Very narrow base of support and very small steps. Patient steady on feet, able to take 1 standing rest break and 1 seated rest break. See vital signs for pertinent vitals.   Stairs            Wheelchair Mobility    Modified Rankin (Stroke Patients Only)       Balance Overall balance assessment: Needs assistance     Sitting balance - Comments: Patient found seated in recliner.   Standing balance support: Bilateral upper extremity supported Standing balance-Leahy Scale: Fair Standing balance comment: Needs support of RW,                     Cognition Arousal/Alertness: Awake/alert Behavior During Therapy: WFL for tasks assessed/performed Overall Cognitive Status: Within Functional Limits for tasks assessed                      Exercises      General Comments        Pertinent Vitals/Pain Pain Assessment: No/denies pain    Home Living                      Prior Function            PT Goals (current goals can now be found in the care plan section) Acute Rehab PT Goals PT Goal Formulation: With patient Time For Goal Achievement: 09/24/14 Potential to Achieve Goals: Good Progress towards PT goals: Progressing toward goals  Frequency  Min 5X/week    PT Plan Current plan remains appropriate    Co-evaluation             End of Session Equipment Utilized During Treatment: Gait belt Activity Tolerance: Patient limited by fatigue Patient left: in chair;with call bell/phone within reach;with family/visitor present     Time: 3875-6433 PT Time Calculation (min) (ACUTE ONLY): 35 min  Charges:  $Gait Training: 23-37 mins                    G CodesRoanna Epley, SPT [phone]  09/19/2014, 2:25 PM  I have read, reviewed and agree with student's note.   [place] [phone] (pager)

## 2014-09-19 NOTE — Progress Notes (Signed)
Patient ID: Larry [name], male   DOB: 07/10/1950, 64 y.o.   MRN: [id number] Advanced Heart Failure Rounding Note   Subjective:    [name] is a 64 y/o man with history of chronic systolic CHF (nonischemic cardiomyopathy) s/p St. 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 He is on chronic milrinone 0.5 mcg.  Prior to admit he was being considered for LVAD.   Dischaged 7/16 after dental procedures. Also had RHC with elevate pulmonary pressures. Revatio was increased to 80 mg tid during that stay. Admitted with fall---> broken L  hip . 7/18 had IM Nail L femur.  7/23 started CVVHD, CVVH stopped 7/31.  8/6 began having severe right ab/flank pain and was found to have large rectus sheath hematoma 8/7 with transfer to ICU. 8/10 2 more units of blood  Says he continues to feel better. He's not sure if his swelling is down but has a very large bruise on abdomen,  Still very tender.   Weight down 1 lbs after dialysis. 244 -> 237.  CVP 10.   Objective:   Weight Range:  Vital Signs:   Temp:  [97.7 F (36.5 C)-98.6 F (37 C)] 97.8 F (36.6 C) (08/11 0800) Pulse Rate:  [69-112] 78 (08/11 0920) Resp:  [9-22] 21 (08/11 0920) BP: (94-118)/(36-47) 94/45 mmHg (08/11 0800) SpO2:  [90 %-100 %] 100 % (08/11 0920) Arterial Line BP: (99-135)/(34-57) 108/43 mmHg (08/11 0920) Weight:  [237 lb 7 oz (107.7 kg)-241 lb 2.9 oz (109.4 kg)] 237 lb 7 oz (107.7 kg) (08/10 1422) Last BM Date: 09/18/14  Weight change: Filed Weights   09/17/14 1905 09/18/14 1000 09/18/14 1422  Weight: 238 lb 15.7 oz (108.4 kg) 241 lb 2.9 oz (109.4 kg) 237 lb 7 oz (107.7 kg)    Intake/Output:   Intake/Output Summary (Last 24 hours) at 09/19/14 0937 Last data filed at 09/19/14 0600  Gross per 24 hour  Intake 1852.3 ml  Output   3000 ml  Net -1147.7 ml     Physical Exam: CVP 10 General:  Sitting in chair NAD HEENT: normal Neck: supple. JVP  6-7. Tunneled HD catheter.  Carotids 2+ bilat; no bruits. No lymphadenopathy or thryomegaly appreciated. Cor: PMI nondisplaced. Regular rate & rhythm. No rubs, gallops or murmurs. Lungs: clear Abdomen: soft, nondistended. Large hematoma over right abdominal wall. Very tender to palpation. More bruising. No rebound. + bowel sounds. Extremities: no cyanosis, clubbing, rash. Trace- 1 + peripheral edema Neuro: Alert/oriented  Telemetry: NSR 70s occasional V pacing.   Labs: Basic Metabolic Panel:  Recent Labs Lab 09/15/14 0306  09/16/14 0440 09/17/14 0035 09/17/14 0440 09/18/14 0317 09/19/14 0300  NA 132*  < > 128* 132* 131* 134* 135  K 4.3  < > 4.5 3.7 3.8 4.1 4.4  CL 96*  < > 93* 99* 97* 100* 99*  CO2 26  < > 22 26 26 28 30   GLUCOSE 120*  < > 204* 166* 170* 143* 130*  BUN 25*  < > 40* 20 21* 22* 18  CREATININE 2.66*  < > 3.68* 2.30* 2.38* 2.55* 2.58*  CALCIUM 8.0*  < > 7.8* 7.6* 7.6* 7.6* 7.7*  MG 2.0  --   --  2.0 1.9  --   --   PHOS  --   --   --   --  3.9  --   --   < > = values in this interval not  displayed.  Liver Function Tests: No results for input(s): AST, ALT, ALKPHOS, BILITOT, PROT, ALBUMIN in the last 168 hours. No results for input(s): LIPASE, AMYLASE in the last 168 hours. No results for input(s): AMMONIA in the last 168 hours.  CBC:  Recent Labs Lab 09/17/14 0440 09/18/14 0317 09/18/14 0635 09/18/14 1651 09/19/14 0300  WBC 11.8* 7.1 7.5 7.3 7.4  HGB 9.1* 7.8* 8.0* 9.8* 9.5*  HCT 26.5* 24.2* 24.9* 30.2* 29.6*  MCV 91.1 94.5 96.5 95.0 95.5  PLT 121* 83* 91* 83* 104*    Cardiac Enzymes: No results for input(s): CKTOTAL, CKMB, CKMBINDEX, TROPONINI in the last 168 hours.  BNP: BNP (last 3 results)  Recent Labs  05/22/14 1029 05/27/14 1126 06/19/14 1030  BNP 935.2* 1627.8* 738.9*    ProBNP (last 3 results)  Recent Labs  10/09/13 0926 11/14/13 0926 01/14/14 1112  PROBNP 1561.0* 1261.0* 5769.0*      Other results:  Imaging: No  results found.   Medications:     Scheduled Medications: . amiodarone  200 mg Oral BID  . atorvastatin  40 mg Oral q1800  . bisacodyl  5 mg Oral QHS  . chlorhexidine  15 mL Mouth/Throat BID  . darbepoetin (ARANESP) injection - DIALYSIS  100 mcg Intravenous Q Mon-HD  . DULoxetine  30 mg Oral Daily  . feeding supplement (NEPRO CARB STEADY)  237 mL Oral BID BM  . ferumoxytol  510 mg Intravenous Once  . insulin aspart  0-9 Units Subcutaneous TID AC & HS  . levothyroxine  75 mcg Oral QAC breakfast  . multivitamin  1 tablet Oral QHS  . sildenafil  80 mg Oral TID    Infusions: . sodium chloride Stopped (09/15/14 1600)  . DOBUTamine Stopped (09/18/14 1000)  . norepinephrine (LEVOPHED) Adult infusion Stopped (09/18/14 0900)  . sodium chloride irrigation      PRN Medications: Place/Maintain arterial line **AND** sodium chloride, albuterol, ALPRAZolam, fentaNYL (SUBLIMAZE) injection, ondansetron (ZOFRAN) IV, oxyCODONE-acetaminophen **AND** oxyCODONE, sodium chloride, temazepam   Assessment:  1. Left Hip Fracture s/p IM nail on 08/26/14 2. Chronic systolic CHF- Nonischemic cardiomyopathy, on home milrinone 0.5 mcg/kg/min, decreased to 0.25 here with NSVT. Became hypotensive, now on dobutamine 2.5.  3. Acute on chronic renal failure - suspect post-op ATN.  CVVH => HD.  4.  Paroxysmal atrial fibrillation requiring DCCV 02/2014, 04/2014- on chronic amio and xarelto prior to admission. CHADS VASc Score- 5.  Remains in NSR.  5. Diabetes mellitus type 2 6. Depression/anxiety 7. OSA, complex- Needs Bipap at night but had difficulty after dental procedure.  8 Pulmonary HTN - Mixed pulmonary venous hypertension and PAH- on Revatio 80 mg tid 9. Dental extractions 08/2014 by Dr Enrique Sack 10. Obesity- Body mass index is 32.39 kg/(m^2). 11. Probable chronic respiratory failure - requiring home O2 with ambulation as of this admission 12. Hyponatremia   13. NSVT 14. Delirium- Improved.  15. UTI-  ceftriaxone 16. Anemia- renal disease/chronic disease.  Transfused 1 unit on 8/5.  17. Hemorrhagic shock with massive retroperitoneal hematoma 8/6  Plan/Discussion:    Appreciate vascular consult.  No surgery recommended at this time.  Will discuss resuming anticoag for afib once levels normal. Off Heparin. INR 1.18 (last dose of coumadin 8/2).   Will continue to monitor fluid status closely.  Hgb 11.1 -> 10.6 -> 9.4 -> 9.1 -> 7.8 -> 8.0 -> 9.8 -> 9.5 (q12hrs) Will follow closely.  Had 2 units yesterday. Continue to follow.  Abdomen bruising now and hematoma still very  large. HD yesterday and will go tomorrow on HD floor. Dialyzed 3 L yesterday.  Weight down 1 lb.  Will leave HD timing to Renal.    Currents goals of care: Aggressive care and Full code per family discussion 09/15/14  Length of Stay: [address]" [place], [place] 09/19/2014 9:37 AM  Advanced Heart Failure Team Pager [phone] (M-F; 7a - 4p)  Please contact [facility] for night-coverage after hours (4p -7a ) and weekends on [email or web address]  Patient seen and examined with Oda Kilts, PA-C. We discussed all aspects of the encounter. I agree with the assessment and plan as stated above.   Improving slowly. Tolerating HD off of dobutamine. CVP 10 . Co-ox 70%. Hgb still drifting down. Appreciate VVS follow-up. Will continue to follow. Continue SCDs for DVT prophylaxis.   The patient is critically ill with multiple organ systems failure and requires high complexity decision making for assessment and support, frequent evaluation and titration of therapies, application of advanced monitoring technologies and extensive interpretation of multiple databases.   Critical Care Time devoted to patient care services described in this note is 35 Minutes.   Jeffrey Voth,MD 5:37 PM

## 2014-09-19 NOTE — Progress Notes (Signed)
Rehab admissions - I will continue to follow pt's status. He is not medically appropriate for CIR at this time. Note: we will have to submit his case to [employer] when appropriate to seek insurance authorization. I will need updated therapy notes to submit for insurance purposes.  I will check on his status tomorrow. Thanks.  Nanetta Batty, PT Rehabilitation Admissions Coordinator [phone]

## 2014-09-20 LAB — RENAL FUNCTION PANEL
ANION GAP: 10 (ref 5–15)
Albumin: 2.3 g/dL — ABNORMAL LOW (ref 3.5–5.0)
BUN: 34 mg/dL — ABNORMAL HIGH (ref 6–20)
CALCIUM: 8.1 mg/dL — AB (ref 8.9–10.3)
CHLORIDE: 97 mmol/L — AB (ref 101–111)
CO2: 26 mmol/L (ref 22–32)
Creatinine, Ser: 3.43 mg/dL — ABNORMAL HIGH (ref 0.61–1.24)
GFR calc Af Amer: 21 mL/min — ABNORMAL LOW (ref >60)
GFR, EST NON AFRICAN AMERICAN: 18 mL/min — AB (ref >60)
GLUCOSE: 128 mg/dL — AB (ref 65–99)
POTASSIUM: 4.2 mmol/L (ref 3.5–5.1)
Phosphorus: 3.7 mg/dL (ref 2.5–4.6)
SODIUM: 133 mmol/L — AB (ref 135–145)

## 2014-09-20 LAB — CBC
HCT: 30.1 % — ABNORMAL LOW (ref 39.0–52.0)
HCT: 32.1 % — ABNORMAL LOW (ref 39.0–52.0)
HEMOGLOBIN: 9.7 g/dL — AB (ref 13.0–17.0)
HEMOGLOBIN: 10.1 g/dL — AB (ref 13.0–17.0)
MCH: 30.8 pg (ref 26.0–34.0)
MCH: 30.3 pg (ref 26.0–34.0)
MCHC: 32.2 g/dL (ref 30.0–36.0)
MCHC: 31.5 g/dL (ref 30.0–36.0)
MCV: 95.6 fL (ref 78.0–100.0)
MCV: 96.4 fL (ref 78.0–100.0)
Platelets: 156 10*3/uL (ref 150–400)
Platelets: 188 10*3/uL (ref 150–400)
RBC: 3.15 MIL/uL — AB (ref 4.22–5.81)
RBC: 3.33 MIL/uL — ABNORMAL LOW (ref 4.22–5.81)
RDW: 19 % — ABNORMAL HIGH (ref 11.5–15.5)
RDW: 19.1 % — ABNORMAL HIGH (ref 11.5–15.5)
WBC: 7.6 10*3/uL (ref 4.0–10.5)
WBC: 8.7 10*3/uL (ref 4.0–10.5)

## 2014-09-20 LAB — CARBOXYHEMOGLOBIN
Carboxyhemoglobin: 2.6 % — ABNORMAL HIGH (ref 0.5–1.5)
Methemoglobin: 1.1 % (ref 0.0–1.5)
O2 Saturation: 65 %
Total hemoglobin: 10 g/dL — ABNORMAL LOW (ref 13.5–18.0)

## 2014-09-20 LAB — GLUCOSE, CAPILLARY: Glucose-Capillary: 131 mg/dL — ABNORMAL HIGH (ref 65–99)

## 2014-09-20 NOTE — Progress Notes (Signed)
   09/20/14 1100  PT - Assessment/Plan  PT Plan Frequency needs to be updated  PT Frequency (ACUTE ONLY) Min 3X/week  Follow Up Recommendations CIR  PT equipment Other (comment) (TBA as patient progresses)  Acute Rehab PT Goals  PT Goal Formulation With patient  Time For Goal Achievement 10/04/14  Potential to Achieve Goals Good  Frequency updated to comply with standards of care.   Goals of 09/10/14 unmet as pt at min to min guard assist level currently but is progressing.  Goals revised and will continue PT.  Thanks.  [place] [phone] (pager)

## 2014-09-20 NOTE — Progress Notes (Signed)
No transfusion last 48 hours.  Hemoglobin stable.  Platelet count improving slowly.  Will recheck early next week   Call if questions  Ruta Hinds, MD [facility] Office: [phone] Pager: [phone]

## 2014-09-20 NOTE — Progress Notes (Signed)
Patient ID: Larry [name], male   DOB: 27-Dec-1950, 64 y.o.   MRN: [id number] Advanced Heart Failure Rounding Note   Subjective:    Larry [name] is a 64 y/o man with history of chronic systolic CHF (nonischemic cardiomyopathy) s/p St. 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 He is on chronic milrinone 0.5 mcg.  Prior to admit he was being considered for LVAD.   Dischaged 7/16 after dental procedures. Also had RHC with elevate pulmonary pressures. Revatio was increased to 80 mg tid during that stay. Admitted with fall---> broken L  hip . 7/18 had IM Nail L femur.  7/23 started CVVHD, CVVH stopped 7/31.  8/6 began having severe right ab/flank pain and was found to have large rectus sheath hematoma 8/7 with transfer to ICU. 8/10 2 more units of blood  Says he continues to feel better. He's not sure if his swelling is down but has a very large bruise on abdomen,  Still very tender.   Weight up 5 lbs with no dialysis yesterday. 237 -> 242. Coox 65 CVP 14-15   Objective:   Weight Range:  Vital Signs:   Temp:  [98.1 F (36.7 C)-98.8 F (37.1 C)] 98.1 F (36.7 C) (08/12 0400) Pulse Rate:  [70-78] 72 (08/12 0700) Resp:  [11-22] 12 (08/12 0700) BP: (101)/(47-50) 101/50 mmHg (08/11 1841) SpO2:  [96 %-100 %] 99 % (08/12 0700) Arterial Line BP: (80-127)/(16-60) 122/57 mmHg (08/12 0700) Weight:  [242 lb 3.2 oz (109.861 kg)] 242 lb 3.2 oz (109.861 kg) (08/12 0600) Last BM Date: 09/19/14  Weight change: Filed Weights   09/18/14 1000 09/18/14 1422 09/20/14 0600  Weight: 241 lb 2.9 oz (109.4 kg) 237 lb 7 oz (107.7 kg) 242 lb 3.2 oz (109.861 kg)    Intake/Output:   Intake/Output Summary (Last 24 hours) at 09/20/14 0808 Last data filed at 09/20/14 0600  Gross per 24 hour  Intake    437 ml  Output    125 ml  Net    312 ml     Physical Exam: CVP 14-15 General:  Sitting in chair NAD HEENT: normal Neck: supple.  JVP 6-7. Tunneled HD catheter.  Carotids 2+ bilat; no bruits. No lymphadenopathy or thryomegaly appreciated. Cor: PMI nondisplaced. Regular rate & rhythm. No rubs, gallops or murmurs. Lungs: clear Abdomen: soft, nondistended. Large hematoma over right abdominal wall. Very tender to palpation. More bruising. No rebound. + bowel sounds. Extremities: no cyanosis, clubbing, rash. Trace- 1 + peripheral edema Neuro: Alert/oriented  Telemetry: NSR 70s occasional V pacing.   Labs: Basic Metabolic Panel:  Recent Labs Lab 09/15/14 0306  09/17/14 0035 09/17/14 0440 09/18/14 0317 09/19/14 0300 09/20/14 0400  NA 132*  < > 132* 131* 134* 135 133*  K 4.3  < > 3.7 3.8 4.1 4.4 4.2  CL 96*  < > 99* 97* 100* 99* 97*  CO2 26  < > 26 26 28 30 26   GLUCOSE 120*  < > 166* 170* 143* 130* 128*  BUN 25*  < > 20 21* 22* 18 34*  CREATININE 2.66*  < > 2.30* 2.38* 2.55* 2.58* 3.43*  CALCIUM 8.0*  < > 7.6* 7.6* 7.6* 7.7* 8.1*  MG 2.0  --  2.0 1.9  --   --   --   PHOS  --   --   --  3.9  --   --  3.7  < > = values in  this interval not displayed.  Liver Function Tests:  Recent Labs Lab 09/20/14 0400  ALBUMIN 2.3*   No results for input(s): LIPASE, AMYLASE in the last 168 hours. No results for input(s): AMMONIA in the last 168 hours.  CBC:  Recent Labs Lab 09/18/14 0635 09/18/14 1651 09/19/14 0300 09/19/14 1700 09/20/14 0400  WBC 7.5 7.3 7.4 8.6 7.6  HGB 8.0* 9.8* 9.5* 10.0* 9.7*  HCT 24.9* 30.2* 29.6* 30.9* 30.1*  MCV 96.5 95.0 95.5 97.2 95.6  PLT 91* 83* 104* 137* 156    Cardiac Enzymes: No results for input(s): CKTOTAL, CKMB, CKMBINDEX, TROPONINI in the last 168 hours.  BNP: BNP (last 3 results)  Recent Labs  05/22/14 1029 05/27/14 1126 06/19/14 1030  BNP 935.2* 1627.8* 738.9*    ProBNP (last 3 results)  Recent Labs  10/09/13 0926 11/14/13 0926 01/14/14 1112  PROBNP 1561.0* 1261.0* 5769.0*      Other results:  Imaging: No results found.   Medications:      Scheduled Medications: . amiodarone  200 mg Oral BID  . atorvastatin  40 mg Oral q1800  . bisacodyl  5 mg Oral QHS  . chlorhexidine  15 mL Mouth/Throat BID  . darbepoetin (ARANESP) injection - DIALYSIS  100 mcg Intravenous Q Mon-HD  . DULoxetine  30 mg Oral Daily  . feeding supplement (NEPRO CARB STEADY)  237 mL Oral BID BM  . insulin aspart  0-9 Units Subcutaneous TID AC & HS  . levothyroxine  75 mcg Oral QAC breakfast  . multivitamin  1 tablet Oral QHS  . sildenafil  80 mg Oral TID    Infusions: . sodium chloride Stopped (09/15/14 1600)  . DOBUTamine Stopped (09/18/14 1000)  . norepinephrine (LEVOPHED) Adult infusion Stopped (09/18/14 0900)  . sodium chloride irrigation      PRN Medications: Place/Maintain arterial line **AND** sodium chloride, albuterol, ALPRAZolam, fentaNYL (SUBLIMAZE) injection, ondansetron (ZOFRAN) IV, oxyCODONE-acetaminophen **AND** oxyCODONE, sodium chloride, temazepam   Assessment:  1. Left Hip Fracture s/p IM nail on 08/26/14 2. Chronic systolic CHF- Nonischemic cardiomyopathy, on home milrinone 0.5 mcg/kg/min, decreased to 0.25 here with NSVT. Became hypotensive, now on dobutamine 2.5.  3. Acute on chronic renal failure - suspect post-op ATN.  CVVH => HD.  4.  Paroxysmal atrial fibrillation requiring DCCV 02/2014, 04/2014- on chronic amio and xarelto prior to admission. CHADS VASc Score- 5.  Remains in NSR.  5. Diabetes mellitus type 2 6. Depression/anxiety 7. OSA, complex- Needs Bipap at night but had difficulty after dental procedure.  8 Pulmonary HTN - Mixed pulmonary venous hypertension and PAH- on Revatio 80 mg tid 9. Dental extractions 08/2014 by Dr Enrique Sack 10. Obesity- Body mass index is 32.39 kg/(m^2). 11. Probable chronic respiratory failure - requiring home O2 with ambulation as of this admission 12. Hyponatremia   13. NSVT 14. Delirium- Improved.  15. UTI- ceftriaxone 16. Anemia- renal disease/chronic disease.  Transfused 1 unit on  8/5.  17. Hemorrhagic shock with massive retroperitoneal hematoma 8/6  Plan/Discussion:     Will discuss resuming anticoag for afib once levels normal. INR normalized. Will continue to monitor fluid status closely.  Hgb 11.1 -> 10.6 -> 9.4 -> 9.1 -> 7.8 -> 8.0 -> 9.8 -> 9.5 -> 10.0 -> 9.7 (q12hrs) Stable today. Will follow closely.  Continue to follow.  Abdomen bruising now and hematoma still very large.  HD today up on HD floor. Anuric without HD yesterday. Weight shows up 5 lbs. Will leave HD timing to Renal.  Likely to  stepdown today, will discuss with MD.  Continue SCDs for DVT prophylaxis.   Currents goals of care: Aggressive care and Full code per family discussion 09/15/14  Length of Stay: [address] [place], [place] 09/20/2014 8:08 AM  Advanced Heart Failure Team Pager [phone] (M-F; 7a - 4p)  Please contact [facility] for night-coverage after hours (4p -7a ) and weekends on [email or web address]  Patient seen and examined with Oda Kilts, PA-C. We discussed all aspects of the encounter. I agree with the assessment and plan as stated above.   Improving slowly. Tolerating HD off of dobutamine.  Hgb now stable. Continue SCDs.Would not anticoagulate at this time until hgb is stable for at least 3-4 days.  Appreciate VVS follow-up. Will continue to follow.   [place] Inthavong,MD 12:15 PM

## 2014-09-20 NOTE — Progress Notes (Addendum)
Rehab admissions - I continue to follow pt's case and noted that pt is in HD currently. I spoke with Bethena Roys at HD and they are beginning the clipping process for HD needs. Pt is planned to transfer to step down unit.  I have noted latest PT note and pt's progress. We will need updated therapy notes, including updated OT notes, to submit to [facility] insurance for authorization. I called therapy office and spoke with [facility], case manager to get new order for OT.  Pamala Hurry, rehab admission coordinator, will follow pt's status on Monday and proceed accordingly. Pamala Hurry can be reached at [phone] Thanks.  Nanetta Batty, PT Rehabilitation Admissions Coordinator [phone]

## 2014-09-20 NOTE — Progress Notes (Addendum)
S: Says he was up walking yest O:BP 101/50 mmHg  Pulse 72  Temp(Src) 98.1 F (36.7 C) (Oral)  Resp 12  Ht 5\' 10"  (1.778 m)  Wt 109.861 kg (242 lb 3.2 oz)  BMI 34.75 kg/m2  SpO2 99%  Intake/Output Summary (Last 24 hours) at 09/20/14 3810 Last data filed at 09/20/14 0600  Gross per 24 hour  Intake    437 ml  Output    125 ml  Net    312 ml   Weight change: 0.461 kg (1 lb 0.3 oz) Gen: Awake and alert CVS: RRR Resp: basilar crackles Abd: + BS, ND mild tenderness rt flank  Large ecchymosis Rt flank Ext: 1+ edema NEURO: CNI Ox3 no asterixis Rt IJ permcath Rt Arm PICC   . amiodarone  200 mg Oral BID  . atorvastatin  40 mg Oral q1800  . bisacodyl  5 mg Oral QHS  . chlorhexidine  15 mL Mouth/Throat BID  . darbepoetin (ARANESP) injection - DIALYSIS  100 mcg Intravenous Q Mon-HD  . DULoxetine  30 mg Oral Daily  . feeding supplement (NEPRO CARB STEADY)  237 mL Oral BID BM  . insulin aspart  0-9 Units Subcutaneous TID AC & HS  . levothyroxine  75 mcg Oral QAC breakfast  . multivitamin  1 tablet Oral QHS  . sildenafil  80 mg Oral TID   No results found. BMET    Component Value Date/Time   NA 133* 09/20/2014 0400   K 4.2 09/20/2014 0400   CL 97* 09/20/2014 0400   CO2 26 09/20/2014 0400   GLUCOSE 128* 09/20/2014 0400   BUN 34* 09/20/2014 0400   CREATININE 3.43* 09/20/2014 0400   CALCIUM 8.1* 09/20/2014 0400   GFRNONAA 18* 09/20/2014 0400   GFRAA 21* 09/20/2014 0400   CBC    Component Value Date/Time   WBC 7.6 09/20/2014 0400   RBC 3.15* 09/20/2014 0400   HGB 9.7* 09/20/2014 0400   HCT 30.1* 09/20/2014 0400   PLT 156 09/20/2014 0400   MCV 95.6 09/20/2014 0400   MCH 30.8 09/20/2014 0400   MCHC 32.2 09/20/2014 0400   RDW 19.0* 09/20/2014 0400   LYMPHSABS 0.9 08/30/2014 1744   MONOABS 1.0 08/30/2014 1744   EOSABS 0.2 08/30/2014 1744   BASOSABS 0.0 08/30/2014 1744     Assessment:  1. New ESRD 2. Severe cardiomyopathy 3. Abd wall hematoma 4. SP Lt hip fx 5.  Anemia on aranesp, did not get full course of IV iron so will redose 6. Sec HPTH not requiring Vit D  PTH 90 on 8/8  Plan: 1. HD today and tomorrow 2. Cont to work with PT  Larry [name]

## 2014-09-20 NOTE — Progress Notes (Signed)
Occupational Therapy Treatment Patient Details Name: Larry [name] MRN: [id number] DOB: [birth year] Today's Date: 09/20/2014    History of present illness Larry [name] is a 64 y.o. male who complains of left hip pain after sustaining a mechanical fall at home. Pt with left intertrochanteric fx s/p IM nail. PMHx NICM, CAD, DDD, back pain, CVA, obesity. 7/23 CRRT initiated, 7/31 CRRT discontinued.   OT comments  Pt has had medical complications since last seen; however is headed towards goals being met. Will continue to benefit from acute OT with follow up on CIR to get to a S level or better prior to D/C home.  Follow Up Recommendations  CIR;Supervision/Assistance - 24 hour    Equipment Recommendations  3 in 1 bedside comode       Precautions / Restrictions Precautions Precautions: Fall Restrictions Weight Bearing Restrictions: No LLE Weight Bearing: Weight bearing as tolerated       Mobility Bed Mobility Overal bed mobility: Needs Assistance Bed Mobility: Sit to Supine       Sit to supine: Mod assist (for LEs and cues for technique)      Transfers Overall transfer level: Needs assistance Equipment used: Rolling walker (2 wheeled) Transfers: Sit to/from Stand Sit to Stand: Min assist                  ADL Overall ADL's : Needs assistance/impaired                         Toilet Transfer: Minimal assistance;Ambulation (recliner>50 feet>sit in recliener behind him>50 feet>sit on bed)                              Cognition   Behavior During Therapy: WFL for tasks assessed/performed Overall Cognitive Status: Within Functional Limits for tasks assessed                                    Pertinent Vitals/ Pain       Pain Assessment: No/denies pain         Frequency Min 2X/week     Progress Toward Goals  OT Goals(current goals can now be found in the care plan section)  Progress towards OT goals: Progressing toward  goals     Plan Discharge plan needs to be updated       End of Session Equipment Utilized During Treatment: Rolling walker;Gait belt   Activity Tolerance Patient tolerated treatment well   Patient Left in bed;with call bell/phone within reach;with family/visitor present   Nurse Communication          Time: 8616-8372 OT Time Calculation (min): 29 min  Charges: OT General Charges $OT Visit: 1 Procedure OT Treatments $Self Care/Home Management : 23-37 mins  Almon Register [phone] 09/20/2014, 2:04 PM

## 2014-09-21 LAB — PTH, INTACT AND CALCIUM
CALCIUM TOTAL (PTH): 7.9 mg/dL — AB (ref 8.6–10.2)
PTH: 66 pg/mL — ABNORMAL HIGH (ref 15–65)

## 2014-09-21 LAB — CBC
HCT: 32 % — ABNORMAL LOW (ref 39.0–52.0)
HCT: 30.7 % — ABNORMAL LOW (ref 39.0–52.0)
Hemoglobin: 10.1 g/dL — ABNORMAL LOW (ref 13.0–17.0)
Hemoglobin: 9.9 g/dL — ABNORMAL LOW (ref 13.0–17.0)
MCH: 30.6 pg (ref 26.0–34.0)
MCH: 31.3 pg (ref 26.0–34.0)
MCHC: 31.6 g/dL (ref 30.0–36.0)
MCHC: 32.2 g/dL (ref 30.0–36.0)
MCV: 97 fL (ref 78.0–100.0)
MCV: 97.2 fL (ref 78.0–100.0)
PLATELETS: 164 10*3/uL (ref 150–400)
PLATELETS: 139 10*3/uL — AB (ref 150–400)
RBC: 3.3 MIL/uL — ABNORMAL LOW (ref 4.22–5.81)
RBC: 3.16 MIL/uL — AB (ref 4.22–5.81)
RDW: 19 % — AB (ref 11.5–15.5)
RDW: 19 % — ABNORMAL HIGH (ref 11.5–15.5)
WBC: 8.4 10*3/uL (ref 4.0–10.5)
WBC: 7.9 10*3/uL (ref 4.0–10.5)

## 2014-09-21 LAB — BASIC METABOLIC PANEL
ANION GAP: 9 (ref 5–15)
BUN: 26 mg/dL — ABNORMAL HIGH (ref 6–20)
CALCIUM: 8.1 mg/dL — AB (ref 8.9–10.3)
CHLORIDE: 98 mmol/L — AB (ref 101–111)
CO2: 26 mmol/L (ref 22–32)
Creatinine, Ser: 2.69 mg/dL — ABNORMAL HIGH (ref 0.61–1.24)
GFR calc non Af Amer: 24 mL/min — ABNORMAL LOW (ref >60)
GFR, EST AFRICAN AMERICAN: 28 mL/min — AB (ref >60)
Glucose, Bld: 121 mg/dL — ABNORMAL HIGH (ref 65–99)
Potassium: 3.9 mmol/L (ref 3.5–5.1)
Sodium: 133 mmol/L — ABNORMAL LOW (ref 135–145)

## 2014-09-21 LAB — GLUCOSE, CAPILLARY
GLUCOSE-CAPILLARY: 116 mg/dL — AB (ref 65–99)
GLUCOSE-CAPILLARY: 108 mg/dL — AB (ref 65–99)
GLUCOSE-CAPILLARY: 137 mg/dL — AB (ref 65–99)

## 2014-09-21 MED ORDER — ENOXAPARIN SODIUM 30 MG/0.3ML ~~LOC~~ SOLN
30.0000 mg | SUBCUTANEOUS | Status: DC
  Administered 2014-09-21 – 2014-09-22 (×2): 30 mg via SUBCUTANEOUS
  Filled 2014-09-21 (×3): qty 0.3

## 2014-09-21 NOTE — Procedures (Signed)
Pt seen on HD.  BFR 400  Ap 180.  4K bath.  Try for 3 L  SBP 94.  Tolerating HD so far.

## 2014-09-21 NOTE — Progress Notes (Signed)
Patient ID: Larry [name], male   DOB: [birth year], 64 y.o.   MRN: [id number] Advanced Heart Failure Rounding Note   Subjective:    [name] is a 64 y/o man with history of chronic systolic CHF (nonischemic cardiomyopathy) s/p [employer]. 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 He is on chronic milrinone 0.5 mcg.  Prior to admit he was being considered for LVAD.   Dischaged 7/16 after dental procedures. Also had RHC with elevate pulmonary pressures. Revatio was increased to 80 mg tid during that stay. Admitted with fall---> broken L  hip . 7/18 had IM Nail L femur.  7/23 started CVVHD, CVVH stopped 7/31.  8/6 began having severe right ab/flank pain and was found to have large retrroperitoneal hematoma 8/7 with transfer to ICU.  Feels stronger. Had HD today without difficulty (off dobutamine). Hemoglobin stable    Objective:   Weight Range:  Vital Signs:   Temp:  [97.6 F (36.4 C)-98.5 F (36.9 C)] 97.6 F (36.4 C) (08/13 1243) Pulse Rate:  [68-131] 72 (08/13 1243) Resp:  [10-20] 14 (08/13 1243) BP: (93-129)/(43-64) 114/49 mmHg (08/13 1243) SpO2:  [87 %-100 %] 100 % (08/13 1243) Arterial Line BP: (103-131)/(43-63) 112/53 mmHg (08/13 1230) Weight:  [105.9 kg (233 lb 7.5 oz)-109.5 kg (241 lb 6.5 oz)] 105.9 kg (233 lb 7.5 oz) (08/13 1120) Last BM Date: 09/20/14  Weight change: Filed Weights   09/20/14 0600 09/21/14 0737 09/21/14 1120  Weight: 109.861 kg (242 lb 3.2 oz) 109.5 kg (241 lb 6.5 oz) 105.9 kg (233 lb 7.5 oz)    Intake/Output:   Intake/Output Summary (Last 24 hours) at 09/21/14 1326 Last data filed at 09/21/14 1250  Gross per 24 hour  Intake    970 ml  Output   5441 ml  Net  -4471 ml     Physical Exam: CVP 8 General:  Lying in bed NAD HEENT: normal Neck: supple. JVP 8. Tunneled HD catheter.  Carotids 2+ bilat; no bruits. No lymphadenopathy or thryomegaly appreciated. Cor: PMI  nondisplaced. Regular rate & rhythm. No rubs, gallops or murmurs. Lungs: clear Abdomen: soft, nondistended. Large hematoma over right abdominal wall. Very tender to palpation. More bruising. No rebound. + bowel sounds. Extremities: no cyanosis, clubbing, rash. 2 + peripheral edema Neuro: Alert/oriented  Telemetry: NSR 70s occasional V pacing.   Labs: Basic Metabolic Panel:  Recent Labs Lab 09/15/14 0306  09/17/14 0035 09/17/14 0440 09/18/14 0317 09/19/14 0300 09/20/14 0400 09/20/14 1315 09/21/14 0415  NA 132*  < > 132* 131* 134* 135 133*  --  133*  K 4.3  < > 3.7 3.8 4.1 4.4 4.2  --  3.9  CL 96*  < > 99* 97* 100* 99* 97*  --  98*  CO2 26  < > 26 26 28 30 26   --  26  GLUCOSE 120*  < > 166* 170* 143* 130* 128*  --  121*  BUN 25*  < > 20 21* 22* 18 34*  --  26*  CREATININE 2.66*  < > 2.30* 2.38* 2.55* 2.58* 3.43*  --  2.69*  CALCIUM 8.0*  < > 7.6* 7.6* 7.6* 7.7* 8.1* 7.9* 8.1*  MG 2.0  --  2.0 1.9  --   --   --   --   --   PHOS  --   --   --  3.9  --   --  3.7  --   --   < > =  values in this interval not displayed.  Liver Function Tests:  Recent Labs Lab 09/20/14 0400  ALBUMIN 2.3*   No results for input(s): LIPASE, AMYLASE in the last 168 hours. No results for input(s): AMMONIA in the last 168 hours.  CBC:  Recent Labs Lab 09/19/14 0300 09/19/14 1700 09/20/14 0400 09/20/14 1315 09/21/14 0415  WBC 7.4 8.6 7.6 8.7 8.4  HGB 9.5* 10.0* 9.7* 10.1* 10.1*  HCT 29.6* 30.9* 30.1* 32.1* 32.0*  MCV 95.5 97.2 95.6 96.4 97.0  PLT 104* 137* 156 188 164    Cardiac Enzymes: No results for input(s): CKTOTAL, CKMB, CKMBINDEX, TROPONINI in the last 168 hours.  BNP: BNP (last 3 results)  Recent Labs  05/22/14 1029 05/27/14 1126 06/19/14 1030  BNP 935.2* 1627.8* 738.9*    ProBNP (last 3 results)  Recent Labs  10/09/13 0926 11/14/13 0926 01/14/14 1112  PROBNP 1561.0* 1261.0* 5769.0*      Other results:  Imaging: No results found.   Medications:      Scheduled Medications: . amiodarone  200 mg Oral BID  . atorvastatin  40 mg Oral q1800  . bisacodyl  5 mg Oral QHS  . chlorhexidine  15 mL Mouth/Throat BID  . darbepoetin (ARANESP) injection - DIALYSIS  100 mcg Intravenous Q Mon-HD  . DULoxetine  30 mg Oral Daily  . feeding supplement (NEPRO CARB STEADY)  237 mL Oral BID BM  . insulin aspart  0-9 Units Subcutaneous TID AC & HS  . levothyroxine  75 mcg Oral QAC breakfast  . multivitamin  1 tablet Oral QHS  . sildenafil  80 mg Oral TID    Infusions: . sodium chloride Stopped (09/15/14 1600)  . DOBUTamine Stopped (09/18/14 1000)  . norepinephrine (LEVOPHED) Adult infusion Stopped (09/18/14 0900)  . sodium chloride irrigation      PRN Medications: Place/Maintain arterial line **AND** sodium chloride, albuterol, ALPRAZolam, fentaNYL (SUBLIMAZE) injection, ondansetron (ZOFRAN) IV, oxyCODONE-acetaminophen **AND** oxyCODONE, sodium chloride, temazepam   Assessment:  1. Left Hip Fracture s/p IM nail on 08/26/14 2. Chronic systolic CHF- Nonischemic cardiomyopathy, on home milrinone 0.5 mcg/kg/min, decreased to 0.25 here with NSVT. Became hypotensive, now on dobutamine 2.5.  3. Acute on chronic renal failure - suspect post-op ATN.  CVVH => HD.  4.  Paroxysmal atrial fibrillation requiring DCCV 02/2014, 04/2014- on chronic amio and xarelto prior to admission. CHADS VASc Score- 5.  Remains in NSR.  5. Diabetes mellitus type 2 6. Depression/anxiety 7. OSA, complex- Needs Bipap at night but had difficulty after dental procedure.  8 Pulmonary HTN - Mixed pulmonary venous hypertension and PAH- on Revatio 80 mg tid 9. Dental extractions 08/2014 by Dr Enrique Sack 10. Obesity 11. Probable chronic respiratory failure - requiring home O2 with ambulation as of this admission 12. Hyponatremia   13. NSVT 14. Delirium- Improved.   15. Hemorrhagic shock with massive retroperitoneal hematoma 8/6 16. Pressure ulcer  Plan/Discussion:     Improving  slowly. Now ambulating unit. Tolerating HD off of dobutamine.  Hgb now stable > 48 hours. Will start low-dose enoxaparin for DVT prophylaxis. Would not start coumadin at this point.  Can go to tele in am.   Leanny Moeckel,MD 1:26 PM Advanced Heart Failure Team Pager [phone] (M-F; 7a - 4p)  Please contact [facility] for night-coverage after hours (4p -7a ) and weekends on [email or web address]

## 2014-09-22 LAB — BASIC METABOLIC PANEL
ANION GAP: 10 (ref 5–15)
BUN: 26 mg/dL — ABNORMAL HIGH (ref 6–20)
CALCIUM: 8 mg/dL — AB (ref 8.9–10.3)
CO2: 26 mmol/L (ref 22–32)
Chloride: 96 mmol/L — ABNORMAL LOW (ref 101–111)
Creatinine, Ser: 2.94 mg/dL — ABNORMAL HIGH (ref 0.61–1.24)
GFR calc Af Amer: 25 mL/min — ABNORMAL LOW (ref >60)
GFR calc non Af Amer: 21 mL/min — ABNORMAL LOW (ref >60)
Glucose, Bld: 117 mg/dL — ABNORMAL HIGH (ref 65–99)
POTASSIUM: 4 mmol/L (ref 3.5–5.1)
Sodium: 132 mmol/L — ABNORMAL LOW (ref 135–145)

## 2014-09-22 LAB — CARBOXYHEMOGLOBIN
CARBOXYHEMOGLOBIN: 2.8 % — AB (ref 0.5–1.5)
METHEMOGLOBIN: 1 % (ref 0.0–1.5)
O2 SAT: 69 %
Total hemoglobin: 9.6 g/dL — ABNORMAL LOW (ref 13.5–18.0)

## 2014-09-22 LAB — CBC
HCT: 30.5 % — ABNORMAL LOW (ref 39.0–52.0)
HEMOGLOBIN: 9.6 g/dL — AB (ref 13.0–17.0)
MCH: 30.9 pg (ref 26.0–34.0)
MCHC: 31.5 g/dL (ref 30.0–36.0)
MCV: 98.1 fL (ref 78.0–100.0)
PLATELETS: 172 10*3/uL (ref 150–400)
RBC: 3.11 MIL/uL — ABNORMAL LOW (ref 4.22–5.81)
RDW: 19 % — AB (ref 11.5–15.5)
WBC: 7.8 10*3/uL (ref 4.0–10.5)

## 2014-09-22 LAB — GLUCOSE, CAPILLARY
Glucose-Capillary: 144 mg/dL — ABNORMAL HIGH (ref 65–99)
Glucose-Capillary: 107 mg/dL — ABNORMAL HIGH (ref 65–99)
Glucose-Capillary: 130 mg/dL — ABNORMAL HIGH (ref 65–99)
Glucose-Capillary: 124 mg/dL — ABNORMAL HIGH (ref 65–99)

## 2014-09-22 MED ORDER — FUROSEMIDE 10 MG/ML IJ SOLN
160.0000 mg | Freq: Once | INTRAVENOUS | Status: AC
  Administered 2014-09-22: 160 mg via INTRAVENOUS
  Filled 2014-09-22: qty 16

## 2014-09-22 NOTE — Progress Notes (Addendum)
Patient ID: Larry [name], male   DOB: 01-25-1951, 64 y.o.   MRN: [id number] Advanced Heart Failure Rounding Note   Subjective:    Mr. Weakland is a 64 y/o man with history of chronic systolic CHF (nonischemic cardiomyopathy) s/p [employer]. 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 He is on chronic milrinone 0.5 mcg.  Prior to admit he was being considered for LVAD.   Dischaged 7/16 after dental procedures. Also had RHC with elevate pulmonary pressures. Revatio was increased to 80 mg tid during that stay. Admitted with fall---> broken L  hip . 7/18 had IM Nail L femur.  7/23 started CVVHD, CVVH stopped 7/31.  8/6 began having severe right ab/flank pain and was found to have large retrroperitoneal hematoma 8/7 with transfer to ICU.  Feels stronger. Had HD today yesterday difficulty (off dobutamine). Hemoglobin relatively stable (9.9 -> 9.6). DVT dose enoxaparin started yesterday. SBP 145-150 by arterial line this am (cuff typically 10 points lower)   Objective:   Weight Range:  Vital Signs:   Temp:  [97.5 F (36.4 C)-98.5 F (36.9 C)] 98.5 F (36.9 C) (08/14 0743) Pulse Rate:  [69-78] 69 (08/14 0743) Resp:  [13-20] 16 (08/14 0743) BP: (93-151)/(49-75) 147/65 mmHg (08/14 0743) SpO2:  [96 %-100 %] 100 % (08/14 0743) Arterial Line BP: (112-152)/(53-72) 152/72 mmHg (08/14 0406) Weight:  [105.9 kg (233 lb 7.5 oz)-107.5 kg (236 lb 15.9 oz)] 107.5 kg (236 lb 15.9 oz) (08/14 0406) Last BM Date: 09/20/14  Weight change: Filed Weights   09/21/14 0737 09/21/14 1120 09/22/14 0406  Weight: 109.5 kg (241 lb 6.5 oz) 105.9 kg (233 lb 7.5 oz) 107.5 kg (236 lb 15.9 oz)    Intake/Output:   Intake/Output Summary (Last 24 hours) at 09/22/14 0838 Last data filed at 09/22/14 0400  Gross per 24 hour  Intake    600 ml  Output   2841 ml  Net  -2241 ml     Physical Exam: General:  Sitting up in bed NAD HEENT: normal Neck:  supple. JVP 8. Tunneled HD catheter.  Carotids 2+ bilat; no bruits. No lymphadenopathy or thryomegaly appreciated. Cor: PMI nondisplaced. Regular rate & rhythm. No rubs, gallops or murmurs. Lungs: clear Abdomen: soft, nondistended. Large hematoma over right abdominal wall. Very tender to palpation. More bruising. No rebound. + bowel sounds. Extremities: no cyanosis, clubbing, rash. 2 + peripheral edema Neuro: Alert/oriented  Telemetry: NSR 70s  Labs: Basic Metabolic Panel:  Recent Labs Lab 09/17/14 0035 09/17/14 0440 09/18/14 0317 09/19/14 0300 09/20/14 0400 09/20/14 1315 09/21/14 0415 09/22/14 0315  NA 132* 131* 134* 135 133*  --  133* 132*  K 3.7 3.8 4.1 4.4 4.2  --  3.9 4.0  CL 99* 97* 100* 99* 97*  --  98* 96*  CO2 26 26 28 30 26   --  26 26  GLUCOSE 166* 170* 143* 130* 128*  --  121* 117*  BUN 20 21* 22* 18 34*  --  26* 26*  CREATININE 2.30* 2.38* 2.55* 2.58* 3.43*  --  2.69* 2.94*  CALCIUM 7.6* 7.6* 7.6* 7.7* 8.1* 7.9* 8.1* 8.0*  MG 2.0 1.9  --   --   --   --   --   --   PHOS  --  3.9  --   --  3.7  --   --   --     Liver Function Tests:  Recent Labs Lab 09/20/14  0400  ALBUMIN 2.3*   No results for input(s): LIPASE, AMYLASE in the last 168 hours. No results for input(s): AMMONIA in the last 168 hours.  CBC:  Recent Labs Lab 09/20/14 0400 09/20/14 1315 09/21/14 0415 09/21/14 1638 09/22/14 0315  WBC 7.6 8.7 8.4 7.9 7.8  HGB 9.7* 10.1* 10.1* 9.9* 9.6*  HCT 30.1* 32.1* 32.0* 30.7* 30.5*  MCV 95.6 96.4 97.0 97.2 98.1  PLT 156 188 164 139* 172    Cardiac Enzymes: No results for input(s): CKTOTAL, CKMB, CKMBINDEX, TROPONINI in the last 168 hours.  BNP: BNP (last 3 results)  Recent Labs  05/22/14 1029 05/27/14 1126 06/19/14 1030  BNP 935.2* 1627.8* 738.9*    ProBNP (last 3 results)  Recent Labs  10/09/13 0926 11/14/13 0926 01/14/14 1112  PROBNP 1561.0* 1261.0* 5769.0*      Other results:   Imaging: No results  found.   Medications:     Scheduled Medications: . amiodarone  200 mg Oral BID  . atorvastatin  40 mg Oral q1800  . bisacodyl  5 mg Oral QHS  . chlorhexidine  15 mL Mouth/Throat BID  . darbepoetin (ARANESP) injection - DIALYSIS  100 mcg Intravenous Q Mon-HD  . DULoxetine  30 mg Oral Daily  . enoxaparin (LOVENOX) injection  30 mg Subcutaneous Q24H  . feeding supplement (NEPRO CARB STEADY)  237 mL Oral BID BM  . insulin aspart  0-9 Units Subcutaneous TID AC & HS  . levothyroxine  75 mcg Oral QAC breakfast  . multivitamin  1 tablet Oral QHS  . sildenafil  80 mg Oral TID    Infusions: . sodium chloride Stopped (09/15/14 1600)  . DOBUTamine Stopped (09/18/14 1000)  . norepinephrine (LEVOPHED) Adult infusion Stopped (09/18/14 0900)  . sodium chloride irrigation      PRN Medications: Place/Maintain arterial line **AND** sodium chloride, albuterol, ALPRAZolam, fentaNYL (SUBLIMAZE) injection, ondansetron (ZOFRAN) IV, oxyCODONE-acetaminophen **AND** oxyCODONE, sodium chloride, temazepam   Assessment:   1. Left Hip Fracture s/p IM nail on 08/26/14 2. Chronic systolic CHF- Nonischemic cardiomyopathy, on home milrinone 0.5 mcg/kg/min, decreased to 0.25 here with NSVT. Became hypotensive, now on dobutamine 2.5.  3. Acute on chronic renal failure - suspect post-op ATN.  CVVH => HD.  4.  Paroxysmal atrial fibrillation requiring DCCV 02/2014, 04/2014- on chronic amio and xarelto prior to admission. CHADS VASc Score- 5.  Remains in NSR.  5. Diabetes mellitus type 2 6. Depression/anxiety 7. OSA, complex- Needs Bipap at night but had difficulty after dental procedure.  8 Pulmonary HTN - Mixed pulmonary venous hypertension and PAH- on Revatio 80 mg tid 9. Dental extractions 08/2014 by Dr Enrique Sack 10. Obesity 11. Probable chronic respiratory failure - requiring home O2 with ambulation as of this admission 12. Hyponatremia   13. NSVT 14. Delirium- Improved.   15. Hemorrhagic shock with massive  retroperitoneal hematoma 8/6 16. Pressure ulcer  Plan/Discussion:    Tolerating HD off of dobutamine.  Hgb stable even with addition oflow-dose enoxaparin for DVT prophylaxis. Would not start coumadin at this point. Will pull arterial line. Cuff pressure rechecked today and about 10 points lower than arterial line. Still making a little urine. Will give dose of IV lasix today and see what happens. Remains volume overloaded will see if they can putt more fluid in HD. Having some old blood drainage from L hip surgical wound. Will ask ortho (Dr. Fredonia Highland to see in am).  Can go to tele.   Gregory Barrick,MD 8:38 AM Advanced Heart Failure  Team Pager [phone] (M-F; [place])  Please contact [facility] for night-coverage after hours (4p -7a ) and weekends on [email or web address]

## 2014-09-22 NOTE — Progress Notes (Addendum)
Left hip oozing min amount.  Site is pink and sutures intact.  Changed left hip dressing and replaced with 4x4 gauze.  HGB 9.6 this am.  No other bleeding noted.  Per day shift RN,  pt had episode during the day with slight oozing.  Dressing had been changed.  Md made aware.  Will continue to monitor. Saunders Revel T

## 2014-09-22 NOTE — Consult Note (Signed)
Hemoglobin stable over several days. Would be very cautious about restarting anticoagulation even low dose Lovenox in this patient.  SCDs should be sufficient for DVT prophylaxis.  Would certainly carefully weigh risk vs benefit as continued bleeding could be a life threatening problem.  Will sign off  Ruta Hinds, MD [facility] Office: [phone] Pager: [phone]

## 2014-09-22 NOTE — Progress Notes (Signed)
S: Sitting up eating breakfast.  Did well with HD yest O:BP 147/65 mmHg  Pulse 69  Temp(Src) 98.5 F (36.9 C) (Oral)  Resp 16  Ht 5\' 10"  (1.778 m)  Wt 107.5 kg (236 lb 15.9 oz)  BMI 34.01 kg/m2  SpO2 100%  Intake/Output Summary (Last 24 hours) at 09/22/14 0819 Last data filed at 09/22/14 0400  Gross per 24 hour  Intake    600 ml  Output   2841 ml  Net  -2241 ml   Weight change:  Gen: Awake and alert CVS: RRR Resp: basilar crackles, improved Abd: + BS, NDNT Ext: 1-2+ edema NEURO: CNI Ox3 no asterixis Rt IJ permcath Rt Arm PICC   . amiodarone  200 mg Oral BID  . atorvastatin  40 mg Oral q1800  . bisacodyl  5 mg Oral QHS  . chlorhexidine  15 mL Mouth/Throat BID  . darbepoetin (ARANESP) injection - DIALYSIS  100 mcg Intravenous Q Mon-HD  . DULoxetine  30 mg Oral Daily  . enoxaparin (LOVENOX) injection  30 mg Subcutaneous Q24H  . feeding supplement (NEPRO CARB STEADY)  237 mL Oral BID BM  . insulin aspart  0-9 Units Subcutaneous TID AC & HS  . levothyroxine  75 mcg Oral QAC breakfast  . multivitamin  1 tablet Oral QHS  . sildenafil  80 mg Oral TID   No results found. BMET    Component Value Date/Time   NA 132* 09/22/2014 0315   K 4.0 09/22/2014 0315   CL 96* 09/22/2014 0315   CO2 26 09/22/2014 0315   GLUCOSE 117* 09/22/2014 0315   BUN 26* 09/22/2014 0315   CREATININE 2.94* 09/22/2014 0315   CALCIUM 8.0* 09/22/2014 0315   CALCIUM 7.9* 09/20/2014 1315   GFRNONAA 21* 09/22/2014 0315   GFRAA 25* 09/22/2014 0315   CBC    Component Value Date/Time   WBC 7.8 09/22/2014 0315   RBC 3.11* 09/22/2014 0315   HGB 9.6* 09/22/2014 0315   HCT 30.5* 09/22/2014 0315   PLT 172 09/22/2014 0315   MCV 98.1 09/22/2014 0315   MCH 30.9 09/22/2014 0315   MCHC 31.5 09/22/2014 0315   RDW 19.0* 09/22/2014 0315   LYMPHSABS 0.9 08/30/2014 1744   MONOABS 1.0 08/30/2014 1744   EOSABS 0.2 08/30/2014 1744   BASOSABS 0.0 08/30/2014 1744     Assessment:  1. New ESRD 2. Severe  cardiomyopathy 3. Abd wall hematoma, improved 4. SP Lt hip fx 5. Anemia on aranesp, received IV iron 6. Sec HPTH not requiring Vit D  PTH 90 on 8/8  Plan: 1. HD  tomorrow 2. Cont to work with PT 3. Will work on getting outpt HD spot 4. Renal diet 5. ? Getting perm access prior to [place] T

## 2014-09-23 ENCOUNTER — Encounter: Payer: Commercial Managed Care - HMO | Admitting: *Deleted

## 2014-09-23 ENCOUNTER — Telehealth: Payer: Self-pay | Admitting: Cardiology

## 2014-09-23 LAB — CBC
HEMATOCRIT: 31.1 % — AB (ref 39.0–52.0)
HEMOGLOBIN: 9.9 g/dL — AB (ref 13.0–17.0)
MCH: 30.7 pg (ref 26.0–34.0)
MCHC: 31.8 g/dL (ref 30.0–36.0)
MCV: 96.6 fL (ref 78.0–100.0)
Platelets: 200 10*3/uL (ref 150–400)
RBC: 3.22 MIL/uL — AB (ref 4.22–5.81)
RDW: 18.9 % — ABNORMAL HIGH (ref 11.5–15.5)
WBC: 6.9 10*3/uL (ref 4.0–10.5)

## 2014-09-23 LAB — BASIC METABOLIC PANEL
ANION GAP: 14 (ref 5–15)
BUN: 45 mg/dL — ABNORMAL HIGH (ref 6–20)
CO2: 21 mmol/L — ABNORMAL LOW (ref 22–32)
Calcium: 8.4 mg/dL — ABNORMAL LOW (ref 8.9–10.3)
Chloride: 96 mmol/L — ABNORMAL LOW (ref 101–111)
Creatinine, Ser: 3.88 mg/dL — ABNORMAL HIGH (ref 0.61–1.24)
GFR calc Af Amer: 18 mL/min — ABNORMAL LOW (ref >60)
GFR, EST NON AFRICAN AMERICAN: 15 mL/min — AB (ref >60)
GLUCOSE: 118 mg/dL — AB (ref 65–99)
Potassium: 5.9 mmol/L — ABNORMAL HIGH (ref 3.5–5.1)
SODIUM: 131 mmol/L — AB (ref 135–145)

## 2014-09-23 LAB — GLUCOSE, CAPILLARY
GLUCOSE-CAPILLARY: 108 mg/dL — AB (ref 65–99)
GLUCOSE-CAPILLARY: 125 mg/dL — AB (ref 65–99)
Glucose-Capillary: 107 mg/dL — ABNORMAL HIGH (ref 65–99)
Glucose-Capillary: 116 mg/dL — ABNORMAL HIGH (ref 65–99)

## 2014-09-23 MED ORDER — AMIODARONE HCL 200 MG PO TABS
200.0000 mg | ORAL_TABLET | Freq: Every day | ORAL | Status: DC
  Administered 2014-09-23 – 2014-10-06 (×14): 200 mg via ORAL
  Filled 2014-09-23 (×19): qty 1

## 2014-09-23 MED ORDER — DARBEPOETIN ALFA 100 MCG/0.5ML IJ SOSY
PREFILLED_SYRINGE | INTRAMUSCULAR | Status: AC
  Filled 2014-09-23: qty 0.5

## 2014-09-23 MED ORDER — ALPRAZOLAM 0.5 MG PO TABS
ORAL_TABLET | ORAL | Status: AC
  Filled 2014-09-23: qty 1

## 2014-09-23 MED ORDER — OXYCODONE HCL 5 MG PO TABS
ORAL_TABLET | ORAL | Status: AC
  Filled 2014-09-23: qty 1

## 2014-09-23 NOTE — Progress Notes (Signed)
09/23/2014 2:27 PM Hemodialysis Outpatient Note; this patient has been accepted at the [facility] on a Monday,Wednesday and Friday 2nd shift schedule. The center can begin treatments on Wednesday the 17th. Thank You. Gordy Savers

## 2014-09-23 NOTE — Progress Notes (Signed)
Patient ID: Larry [name], male   DOB: 12-30-1950, 64 y.o.   MRN: [id number] Advanced Heart Failure Rounding Note   Subjective:    Larry [name] is a 64 y/o man with history of chronic systolic CHF (nonischemic cardiomyopathy) s/p St. 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 He is on chronic milrinone 0.5 mcg.  Prior to admit he was being considered for LVAD.   Discharged 7/16 after dental procedures. Also had RHC with elevate pulmonary pressures. Revatio was increased to 80 mg tid during that stay. Admitted with fall---> broken L  hip . 7/18 had IM Nail L femur.  7/23 started CVVHD, CVVH stopped 7/31.  8/6 began having severe right ab/flank pain and was found to have large retrroperitoneal hematoma 8/7 with transfer to ICU.  Seen at HD today.  BP has been tolerating HD, now off dobutamine.  Stable overall.  Working with PT.  Still some bleeding at left hip surgical site but seemed to be less last night per patient.    Objective:   Weight Range:  Vital Signs:   Temp:  [97.5 F (36.4 C)-98.3 F (36.8 C)] 97.7 F (36.5 C) (08/15 0624) Pulse Rate:  [71-77] 75 (08/15 0624) Resp:  [11-31] 19 (08/15 0624) BP: (96-116)/(50-60) 108/60 mmHg (08/15 0624) SpO2:  [94 %-100 %] 98 % (08/15 0624) Arterial Line BP: (118)/(46) 118/46 mmHg (08/14 0849) Weight:  [236 lb 15.9 oz (107.5 kg)-238 lb 12.8 oz (108.319 kg)] 238 lb 12.8 oz (108.319 kg) (08/15 0624) Last BM Date: 09/20/14  Weight change: Filed Weights   09/22/14 0406 09/22/14 1657 09/23/14 0624  Weight: 236 lb 15.9 oz (107.5 kg) 236 lb 15.9 oz (107.5 kg) 238 lb 12.8 oz (108.319 kg)    Intake/Output:   Intake/Output Summary (Last 24 hours) at 09/23/14 0749 Last data filed at 09/23/14 0635  Gross per 24 hour  Intake   1050 ml  Output      0 ml  Net   1050 ml     Physical Exam: General:  Sitting up in bed NAD HEENT: normal Neck: supple. JVP 12. Tunneled  HD catheter.  Carotids 2+ bilat; no bruits. No lymphadenopathy or thryomegaly appreciated. Cor: PMI nondisplaced. Regular rate & rhythm. No rubs, gallops or murmurs. Lungs: clear Abdomen: soft, nondistended. Large hematoma over right flank. + bowel sounds. Extremities: no cyanosis, clubbing, rash. 1+ edema to knees bilaterally. Neuro: Alert/oriented  Telemetry: NSR 70s  Labs: Basic Metabolic Panel:  Recent Labs Lab 09/17/14 0035 09/17/14 0440  09/19/14 0300 09/20/14 0400  09/21/14 0415 09/22/14 0315 09/23/14 0350  NA 132* 131*  < > 135 133*  --  133* 132* 131*  K 3.7 3.8  < > 4.4 4.2  --  3.9 4.0 5.9*  CL 99* 97*  < > 99* 97*  --  98* 96* 96*  CO2 26 26  < > 30 26  --  26 26 21*  GLUCOSE 166* 170*  < > 130* 128*  --  121* 117* 118*  BUN 20 21*  < > 18 34*  --  26* 26* 45*  CREATININE 2.30* 2.38*  < > 2.58* 3.43*  --  2.69* 2.94* 3.88*  CALCIUM 7.6* 7.6*  < > 7.7* 8.1*  < > 8.1* 8.0* 8.4*  MG 2.0 1.9  --   --   --   --   --   --   --   PHOS  --  3.9  --   --  3.7  --   --   --   --   < > = values in this interval not displayed.  Liver Function Tests:  Recent Labs Lab 09/20/14 0400  ALBUMIN 2.3*   No results for input(s): LIPASE, AMYLASE in the last 168 hours. No results for input(s): AMMONIA in the last 168 hours.  CBC:  Recent Labs Lab 09/20/14 0400 09/20/14 1315 09/21/14 0415 09/21/14 1638 09/22/14 0315  WBC 7.6 8.7 8.4 7.9 7.8  HGB 9.7* 10.1* 10.1* 9.9* 9.6*  HCT 30.1* 32.1* 32.0* 30.7* 30.5*  MCV 95.6 96.4 97.0 97.2 98.1  PLT 156 188 164 139* 172    Cardiac Enzymes: No results for input(s): CKTOTAL, CKMB, CKMBINDEX, TROPONINI in the last 168 hours.  BNP: BNP (last 3 results)  Recent Labs  05/22/14 1029 05/27/14 1126 06/19/14 1030  BNP 935.2* 1627.8* 738.9*    ProBNP (last 3 results)  Recent Labs  10/09/13 0926 11/14/13 0926 01/14/14 1112  PROBNP 1561.0* 1261.0* 5769.0*      Other results:   Imaging: No results  found.   Medications:     Scheduled Medications: . ALPRAZolam      . amiodarone  200 mg Oral BID  . atorvastatin  40 mg Oral q1800  . bisacodyl  5 mg Oral QHS  . Darbepoetin Alfa      . darbepoetin (ARANESP) injection - DIALYSIS  100 mcg Intravenous Q Mon-HD  . DULoxetine  30 mg Oral Daily  . feeding supplement (NEPRO CARB STEADY)  237 mL Oral BID BM  . insulin aspart  0-9 Units Subcutaneous TID AC & HS  . levothyroxine  75 mcg Oral QAC breakfast  . multivitamin  1 tablet Oral QHS  . oxyCODONE      . sildenafil  80 mg Oral TID    Infusions: . sodium chloride Stopped (09/15/14 1600)  . norepinephrine (LEVOPHED) Adult infusion Stopped (09/18/14 0900)  . sodium chloride irrigation      PRN Medications: albuterol, ALPRAZolam, fentaNYL (SUBLIMAZE) injection, ondansetron (ZOFRAN) IV, oxyCODONE-acetaminophen **AND** oxyCODONE, sodium chloride, temazepam   Assessment:   1. Left Hip Fracture s/p IM nail on 08/26/14 2. Chronic systolic CHF- Nonischemic cardiomyopathy, on home milrinone 0.5 mcg/kg/min, decreased to 0.25 here with NSVT. Became hypotensive, transitioned to dobutamine 2.5 => now off dobutamine as well.  3. Acute on chronic renal failure - suspect post-op ATN.  CVVH => HD.  4.  Paroxysmal atrial fibrillation requiring DCCV 02/2014, 04/2014- on chronic amio and xarelto prior to admission. CHADS VASc Score- 5.  Remains in NSR.  5. Diabetes mellitus type 2 6. Depression/anxiety 7. OSA, complex- Needs Bipap at night but had difficulty after dental procedure.  8 Pulmonary HTN - Mixed pulmonary venous hypertension and PAH- on Revatio 80 mg tid 9. Dental extractions 08/2014 by Dr Enrique Sack 10. Obesity 11. Probable chronic respiratory failure - requiring home O2 with ambulation as of this admission 12. Hyponatremia   13. NSVT 14. Delirium- Improved.   15. Hemorrhagic shock with massive retroperitoneal hematoma 8/6 => off anticoagulation.  16. Pressure ulcer  Plan/Discussion:     Tolerating HD off of dobutamine so far.  He is volume overloaded on exam and will need fluid off.    Hgb has been stable recently, no CBC done today.  Will send tomorrow.  Vascular note reviewed, Dr Oneida Alar concerned about even low dose Lovenox.  Will use SCDs instead of low dose Lovenox.  Will need to leave  off anticoagulation for the time being despite CVA risk from atrial fibrillation.  Having some old blood drainage from L hip surgical wound. Will ask ortho to see today to make sure site is stable.  Now that he is off inotropes, think we can decrease amiodarone to once daily.  Remains in NSR.   Needs evaluation again by inpatient rehab service, hopefully can go there this week.   Kierstynn Babich,MD 7:49 AM Advanced Heart Failure Team Pager [phone] (M-F; 7a - 4p)  Please contact [facility] for night-coverage after hours (4p -7a ) and weekends on [email or web address]

## 2014-09-23 NOTE — Telephone Encounter (Signed)
Confirmed remote transmission w/ pt wife.   

## 2014-09-23 NOTE — Progress Notes (Signed)
     Subjective:  S/P L hip IM nailing 08/26/14. Patient reports pain as mild.  Resting comfortably in a chair at this time.  Reports that the hip is doing well.  Nursing was concerned that the most proximal L hip incision has been draining serosanguinous fluid.  No recent fevers/chills.    Objective:   VITALS:   Filed Vitals:   09/23/14 1158 09/23/14 1212 09/23/14 1352 09/23/14 1428  BP: 110/90 105/55 89/44 100/57  Pulse: 73 72 72 73  Temp: 97.7 F (36.5 C)  98.5 F (36.9 C)   TempSrc: Oral  Oral   Resp:      Height:      Weight: 105 kg (231 lb 7.7 oz)     SpO2: 98%  98%     Neurologically intact ABD soft Neurovascular intact Sensation intact distally Intact pulses distally Dorsiflexion/Plantar flexion intact Incision: scant drainage Palpable hematoma noted under the incision of the L hip (proximal).  Tender to palpation.  Scant drainage was able to be expressed but not purulent in nature.   Lab Results  Component Value Date   WBC 6.9 09/23/2014   HGB 9.9* 09/23/2014   HCT 31.1* 09/23/2014   MCV 96.6 09/23/2014   PLT 200 09/23/2014   BMET    Component Value Date/Time   NA 131* 09/23/2014 0350   K 5.9* 09/23/2014 0350   CL 96* 09/23/2014 0350   CO2 21* 09/23/2014 0350   GLUCOSE 118* 09/23/2014 0350   BUN 45* 09/23/2014 0350   CREATININE 3.88* 09/23/2014 0350   CALCIUM 8.4* 09/23/2014 0350   CALCIUM 7.9* 09/20/2014 1315   GFRNONAA 15* 09/23/2014 0350   GFRAA 18* 09/23/2014 0350     Assessment/Plan: 28 Days Post-Op   Active Problems:   Persistent atrial fibrillation   Automatic implantable cardioverter-defibrillator in situ   DM2 (diabetes mellitus, type 2)   Ventricular tachycardia   Hip fracture   Left femoral shaft fracture   Acute on chronic renal failure   Acute on chronic systolic CHF (congestive heart failure)   Acute renal failure superimposed on stage 4 chronic kidney disease   Chronic atrial fibrillation   Debility   Hemorrhagic shock  Retroperitoneal hematoma   Up with therapy WBAT in the LLE Proximal L hip incision has a palpable hematoma under the incision. Scant serosanguinous drainage was able to be expressed.  No signs of infection noted.  Will continue to monitor.  Most likely occurred when bumped into the bed rail a few days back.  Repeat xrays ordered.  Will hold on removing the stitches until incision is not draining.    Larry [name] Lelan Pons 09/23/2014, 3:12 PM Cell [phone]

## 2014-09-23 NOTE — Progress Notes (Addendum)
Patient last seen by P. T. 8/11 and OT 8/12.  I await therapy updates to assist with planning disposition for rehab. I have contacted acute therapy department for request. [facility] will require updated therapy assessment for any authorizations. [phone]

## 2014-09-23 NOTE — Progress Notes (Signed)
Physical Therapy Treatment Patient Details Name: Larry [name] MRN: [id number] DOB: 03-14-1950 Today's Date: 09/23/2014    History of Present Illness Larry [name] is a 64 y.o. male who complains of left hip pain after sustaining a mechanical fall at home. Pt with left intertrochanteric fx s/p IM nail. PMHx NICM, CAD, DDD, back pain, CVA, obesity. 7/23 CRRT initiated, 7/31 CRRT discontinued.    PT Comments    Patient seated at EOB eating lunch, eager to participate in PT today. Overall he seems to have much more energy and his swelling has gone down. Patient was able to ambulate and transfer as described below. He did not need a chair follow today. He reports performing exercises in the bed regularly when not being seen by PT. Patient will benefit from continued PT to increase ambulatory endurance.   Follow Up Recommendations  CIR     Equipment Recommendations   (TBA)    Recommendations for Other Services       Precautions / Restrictions Restrictions Weight Bearing Restrictions: No LLE Weight Bearing: Weight bearing as tolerated    Mobility  Bed Mobility               General bed mobility comments: Patient seated EOB finishing lunch.  Transfers Overall transfer level: Modified independent Equipment used: Rolling walker (2 wheeled) Transfers: Sit to/from Stand Sit to Stand: Supervision         General transfer comment: Patient able to stand without physical assistance, verbally demonstrated cues to push up from the bed.   Ambulation/Gait Ambulation/Gait assistance: Min guard Ambulation Distance (Feet): 120 Feet Assistive device: Rolling walker (2 wheeled) Gait Pattern/deviations: Step-through pattern;Decreased stance time - left;Decreased step length - right;Decreased weight shift to left;Narrow base of support Gait velocity: Decreased Gait velocity interpretation: Below normal speed for age/gender General Gait Details: Patient able to walk in an improved  manner, with 2 standing rest breaks at the very end of his walk. Patient never felt unsteady on his feet and no LOB were noted. Patient's gait speed decreased significantly as he fatigued.   Stairs            Wheelchair Mobility    Modified Rankin (Stroke Patients Only)       Balance Overall balance assessment: Modified Independent Sitting-balance support: No upper extremity supported;Feet supported Sitting balance-Leahy Scale: Good Sitting balance - Comments: Patient able to don extra gown with no UE support.   Standing balance support: Single extremity supported Standing balance-Leahy Scale: Fair Standing balance comment: Patient able to assist with donning gait belt but needed one hand to stay on walker for support.                    Cognition Arousal/Alertness: Awake/alert Behavior During Therapy: WFL for tasks assessed/performed Overall Cognitive Status: Within Functional Limits for tasks assessed                      Exercises      General Comments        Pertinent Vitals/Pain Pain Assessment: No/denies pain    Home Living                      Prior Function            PT Goals (current goals can now be found in the care plan section) Acute Rehab PT Goals Patient Stated Goal: return home able to care for himself PT Goal Formulation: With  patient Time For Goal Achievement: 10/04/14 Potential to Achieve Goals: Good Progress towards PT goals: Progressing toward goals    Frequency  Min 3X/week    PT Plan Current plan remains appropriate    Co-evaluation             End of Session Equipment Utilized During Treatment: Gait belt Activity Tolerance: Patient tolerated treatment well Patient left: in chair;with call bell/phone within reach     Time: 1320-1336 PT Time Calculation (min) (ACUTE ONLY): 16 min  Charges:  $Gait Training: 8-22 mins                    G CodesRoanna Epley,  SPT [phone]  09/23/2014, 3:40 PM  I have read, reviewed and agree with student's note.   [place] [phone] (pager)

## 2014-09-23 NOTE — Progress Notes (Signed)
Tolerating hemodialysis.  Planning for outpt dialysis. Trying for fluid 3000cc today Heparin on hold. Hazael Olveda C

## 2014-09-23 NOTE — Progress Notes (Signed)
Patient was transferred back to [place] from recent stay in ICU.  A review of chart indicates that patient is being considered for possible admission to CIR which is patient and wife's preference.  CSW had spoken to patient/wife in the past about short term SNF- however- a great deal of time has passed and therapy now recommends CIR. CSW services continue to follow and will be prepared to assist patient/wife with short term SNF if CIR is unable to accept patient.  [name]. [name], [place]

## 2014-09-24 ENCOUNTER — Inpatient Hospital Stay (HOSPITAL_COMMUNITY): Payer: Commercial Managed Care - HMO

## 2014-09-24 ENCOUNTER — Encounter: Payer: Self-pay | Admitting: Cardiology

## 2014-09-24 LAB — CBC
HCT: 31.3 % — ABNORMAL LOW (ref 39.0–52.0)
Hemoglobin: 10 g/dL — ABNORMAL LOW (ref 13.0–17.0)
MCH: 30.9 pg (ref 26.0–34.0)
MCHC: 31.9 g/dL (ref 30.0–36.0)
MCV: 96.6 fL (ref 78.0–100.0)
PLATELETS: 224 10*3/uL (ref 150–400)
RBC: 3.24 MIL/uL — ABNORMAL LOW (ref 4.22–5.81)
RDW: 18.8 % — AB (ref 11.5–15.5)
WBC: 6.6 10*3/uL (ref 4.0–10.5)

## 2014-09-24 LAB — BASIC METABOLIC PANEL
Anion gap: 9 (ref 5–15)
BUN: 34 mg/dL — AB (ref 6–20)
CO2: 27 mmol/L (ref 22–32)
CREATININE: 3.41 mg/dL — AB (ref 0.61–1.24)
Calcium: 8.4 mg/dL — ABNORMAL LOW (ref 8.9–10.3)
Chloride: 94 mmol/L — ABNORMAL LOW (ref 101–111)
GFR calc Af Amer: 21 mL/min — ABNORMAL LOW (ref >60)
GFR, EST NON AFRICAN AMERICAN: 18 mL/min — AB (ref >60)
Glucose, Bld: 126 mg/dL — ABNORMAL HIGH (ref 65–99)
Potassium: 4.3 mmol/L (ref 3.5–5.1)
SODIUM: 130 mmol/L — AB (ref 135–145)

## 2014-09-24 LAB — GLUCOSE, CAPILLARY
Glucose-Capillary: 104 mg/dL — ABNORMAL HIGH (ref 65–99)
Glucose-Capillary: 169 mg/dL — ABNORMAL HIGH (ref 65–99)
Glucose-Capillary: 148 mg/dL — ABNORMAL HIGH (ref 65–99)
Glucose-Capillary: 132 mg/dL — ABNORMAL HIGH (ref 65–99)

## 2014-09-24 MED ORDER — POLYETHYLENE GLYCOL 3350 17 G PO PACK
17.0000 g | PACK | Freq: Every day | ORAL | Status: DC | PRN
  Administered 2014-09-24 – 2014-10-10 (×5): 17 g via ORAL
  Filled 2014-09-24 (×5): qty 1

## 2014-09-24 MED ORDER — BISACODYL 10 MG RE SUPP
10.0000 mg | Freq: Every day | RECTAL | Status: DC | PRN
  Administered 2014-09-25 – 2014-10-03 (×3): 10 mg via RECTAL
  Filled 2014-09-24 (×4): qty 1

## 2014-09-24 NOTE — Progress Notes (Signed)
I met with pt and his wife at bedside. Noted functional progress with PT and OT minguard assist 120 feet with RW and overall minguard assist with OT.  I will discuss with [facility] if an inpt rehab admission would be approved for a brief rehab stay vs SNF rehab. Wife will return to work when pt discharged and pt will be new to hemodialysis outpt at d/c. Pt states Nephrology discussing daily hemodialysis for fluid removal and therefore pt and wife feel pt not medically ready for dc today to inpt rehab or SNF. I do not have an inpt rehab bed available today for this pt to admit. They are open to SNF rehab. I will update RN CM and SW and follow up tomorrow. [phone]

## 2014-09-24 NOTE — Consult Note (Addendum)
WOC wound follow-up consult note Wound type: Buttocks with previous red partial thickness abrasion; now dry red scar tissue. Deep tissue injury area between buttocks darker in color is fading and resolving, patchy areas of partial thickness skin loss where skin is peeling and scant amt bleeding. Left heel with stage 2 dark purple fluid-filled blister, intact without open wound or drainage, 1.5X1cm. Patient is aware to float heels to decrease pressure.  Bilat legs with generalized edema.  Right heel with stage 1 area; 1X1cm red and nonblanchable. Wife at bedside assessed areas during consult and discussed plan of care. Foam dressings in place to protect sites. Pt is now getting OOB and reviewed pressure reducing strategies. Please re-consult if further assistance is needed. Thank-you,  Julien Girt MSN, [place], [place], [place], [place]

## 2014-09-24 NOTE — Progress Notes (Signed)
Patient ID: Larry [name], male   DOB: 1950/06/20, 64 y.o.   MRN: [id number] Advanced Heart Failure Rounding Note   Subjective:    Larry [name] is a 64 y/o man with history of chronic systolic CHF (nonischemic cardiomyopathy) s/p St. 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 He is on chronic milrinone 0.5 mcg.  Prior to admit he was being considered for LVAD.   Discharged 7/16 after dental procedures. Also had RHC with elevate pulmonary pressures. Revatio was increased to 80 mg tid during that stay. Admitted with fall---> broken L  hip . 7/18 had IM Nail L femur.  7/23 started CVVHD, CVVH stopped 7/31.  8/6 began having severe right ab/flank pain and was found to have large retrroperitoneal hematoma 8/7 with transfer to ICU.  HD yesterday, about 3L off and weight down 5 lbs.  BP has been tolerating HD, now off dobutamine.  Stable overall.  Working with PT.  Ortho evaluated, left hip surgical site appears stable/non-infected.  He has a lot of lower leg swelling.    Objective:   Weight Range:  Vital Signs:   Temp:  [97.7 F (36.5 C)-98.5 F (36.9 C)] 98 F (36.7 C) (08/16 0418) Pulse Rate:  [70-79] 74 (08/16 0418) Resp:  [13-18] 18 (08/15 2138) BP: (89-115)/(44-90) 110/60 mmHg (08/16 0418) SpO2:  [95 %-98 %] 95 % (08/16 0418) Weight:  [231 lb 7.7 oz (105 kg)-233 lb 4.8 oz (105.824 kg)] 233 lb 4.8 oz (105.824 kg) (08/16 0418) Last BM Date: 09/20/14  Weight change: Filed Weights   09/23/14 0750 09/23/14 1158 09/24/14 0418  Weight: 238 lb 15.7 oz (108.4 kg) 231 lb 7.7 oz (105 kg) 233 lb 4.8 oz (105.824 kg)    Intake/Output:   Intake/Output Summary (Last 24 hours) at 09/24/14 0810 Last data filed at 09/24/14 0604  Gross per 24 hour  Intake    700 ml  Output   2979 ml  Net  -2279 ml     Physical Exam: General:  Sitting up in bed NAD HEENT: normal Neck: supple. JVP 10. Tunneled HD catheter.   Carotids 2+ bilat; no bruits. No lymphadenopathy or thryomegaly appreciated. Cor: PMI nondisplaced. Regular rate & rhythm. No rubs, gallops or murmurs. Lungs: clear Abdomen: soft, nondistended. Large hematoma over right flank. + bowel sounds. Extremities: no cyanosis, clubbing, rash. 2+ edema to knees bilaterally. Neuro: Alert/oriented  Telemetry: NSR 70s  Labs: Basic Metabolic Panel:  Recent Labs Lab 09/20/14 0400  09/21/14 0415 09/22/14 0315 09/23/14 0350 09/24/14 0435  NA 133*  --  133* 132* 131* 130*  K 4.2  --  3.9 4.0 5.9* 4.3  CL 97*  --  98* 96* 96* 94*  CO2 26  --  26 26 21* 27  GLUCOSE 128*  --  121* 117* 118* 126*  BUN 34*  --  26* 26* 45* 34*  CREATININE 3.43*  --  2.69* 2.94* 3.88* 3.41*  CALCIUM 8.1*  < > 8.1* 8.0* 8.4* 8.4*  PHOS 3.7  --   --   --   --   --   < > = values in this interval not displayed.  Liver Function Tests:  Recent Labs Lab 09/20/14 0400  ALBUMIN 2.3*   No results for input(s): LIPASE, AMYLASE in the last 168 hours. No results for input(s): AMMONIA in the last 168 hours.  CBC:  Recent Labs Lab 09/21/14 0415 09/21/14 1638 09/22/14 0315 09/23/14  1440 09/24/14 0435  WBC 8.4 7.9 7.8 6.9 6.6  HGB 10.1* 9.9* 9.6* 9.9* 10.0*  HCT 32.0* 30.7* 30.5* 31.1* 31.3*  MCV 97.0 97.2 98.1 96.6 96.6  PLT 164 139* 172 200 224    Cardiac Enzymes: No results for input(s): CKTOTAL, CKMB, CKMBINDEX, TROPONINI in the last 168 hours.  BNP: BNP (last 3 results)  Recent Labs  05/22/14 1029 05/27/14 1126 06/19/14 1030  BNP 935.2* 1627.8* 738.9*    ProBNP (last 3 results)  Recent Labs  10/09/13 0926 11/14/13 0926 01/14/14 1112  PROBNP 1561.0* 1261.0* 5769.0*      Other results:   Imaging: No results found.   Medications:     Scheduled Medications: . amiodarone  200 mg Oral Daily  . atorvastatin  40 mg Oral q1800  . bisacodyl  5 mg Oral QHS  . darbepoetin (ARANESP) injection - DIALYSIS  100 mcg Intravenous Q Mon-HD  .  DULoxetine  30 mg Oral Daily  . feeding supplement (NEPRO CARB STEADY)  237 mL Oral BID BM  . insulin aspart  0-9 Units Subcutaneous TID AC & HS  . levothyroxine  75 mcg Oral QAC breakfast  . multivitamin  1 tablet Oral QHS  . sildenafil  80 mg Oral TID    Infusions: . sodium chloride Stopped (09/15/14 1600)  . norepinephrine (LEVOPHED) Adult infusion Stopped (09/18/14 0900)  . sodium chloride irrigation      PRN Medications: albuterol, ALPRAZolam, bisacodyl, fentaNYL (SUBLIMAZE) injection, ondansetron (ZOFRAN) IV, oxyCODONE-acetaminophen **AND** oxyCODONE, polyethylene glycol, sodium chloride, temazepam   Assessment:   1. Left Hip Fracture s/p IM nail on 08/26/14 2. Chronic systolic CHF- Nonischemic cardiomyopathy, on home milrinone 0.5 mcg/kg/min, decreased to 0.25 here with NSVT. Became hypotensive, transitioned to dobutamine 2.5 => now off dobutamine as well.  3. Acute on chronic renal failure - suspect post-op ATN.  CVVH => HD.  4.  Paroxysmal atrial fibrillation requiring DCCV 02/2014, 04/2014- on chronic amio and xarelto prior to admission. CHADS VASc Score- 5.  Remains in NSR.  5. Diabetes mellitus type 2 6. Depression/anxiety 7. OSA, complex- Needs Bipap at night but had difficulty after dental procedure.  8 Pulmonary HTN - Mixed pulmonary venous hypertension and PAH- on Revatio 80 mg tid 9. Dental extractions 08/2014 by Dr Enrique Sack 10. Obesity 11. Probable chronic respiratory failure - requiring home O2 with ambulation as of this admission 12. Hyponatremia   13. NSVT 14. Delirium- Improved.   15. Hemorrhagic shock with massive retroperitoneal hematoma 8/6 => off anticoagulation.  16. Pressure ulcer  Plan/Discussion:    Tolerating HD off of dobutamine so far.  He is still volume overloaded on exam and continues to need fluid off via dialysis, but he is comfortable without dyspnea.  Will have him wear Ted hose to help with the edema.    Hgb has been stable.  Vascular note  reviewed, Dr Oneida Alar concerned about even low dose Lovenox.  Will use SCDs instead of low dose Lovenox.  Will need to leave off anticoagulation for the time being despite CVA risk from atrial fibrillation.  Now that he is off inotropes, we have decreased amiodarone to once daily.  Remains in NSR.   I think that he is ready to go to inpatient rehab whenever he is accepted.    Rejeana Fadness,MD 8:10 AM Advanced Heart Failure Team Pager [phone] (M-F; 7a - 4p)  Please contact [facility] for night-coverage after hours (4p -7a ) and weekends on [email or web address]

## 2014-09-24 NOTE — Progress Notes (Signed)
S: denies sob. Ambulation troublesome due to leg pain.  Did well with HD yesterday. Keeping legs elevated to reduce swelling.  O:BP 105/59 mmHg  Pulse 70  Temp(Src) 98 F (36.7 C) (Oral)  Resp 18  Ht 5\' 10"  (1.778 m)  Wt 233 lb 4.8 oz (105.824 kg)  BMI 33.48 kg/m2  SpO2 95%  Intake/Output Summary (Last 24 hours) at 09/24/14 1400 Last data filed at 09/24/14 1302  Gross per 24 hour  Intake   1720 ml  Output     50 ml  Net   1670 ml   Weight change: 1 lb 15.8 oz (0.9 kg) Gen: Awake and alert CVS: RRR Resp: mild crackles on bases.  Abd: + BS, NDNT. Ecchymoses on right flank.  Ext: 2+ pitting edmea b/l LE's.  NEURO: CNI Ox3 no asterixis Rt IJ permcath Rt Arm PICC   . amiodarone  200 mg Oral Daily  . atorvastatin  40 mg Oral q1800  . bisacodyl  5 mg Oral QHS  . darbepoetin (ARANESP) injection - DIALYSIS  100 mcg Intravenous Q Mon-HD  . DULoxetine  30 mg Oral Daily  . feeding supplement (NEPRO CARB STEADY)  237 mL Oral BID BM  . insulin aspart  0-9 Units Subcutaneous TID AC & HS  . levothyroxine  75 mcg Oral QAC breakfast  . multivitamin  1 tablet Oral QHS  . sildenafil  80 mg Oral TID   Dg Femur Min 2 Views Left  09/24/2014   CLINICAL DATA:  Open reduction internal fixation left femur.  EXAM: LEFT FEMUR 2 VIEWS  COMPARISON:  None.  FINDINGS: ORIF left femur. Hardware intact. No acute bony abnormality. Peripheral vascular calcification present.  IMPRESSION: ORIF left femur .   Electronically Signed   By: Marcello Moores  Register   On: 09/24/2014 11:24   BMET    Component Value Date/Time   NA 130* 09/24/2014 0435   K 4.3 09/24/2014 0435   CL 94* 09/24/2014 0435   CO2 27 09/24/2014 0435   GLUCOSE 126* 09/24/2014 0435   BUN 34* 09/24/2014 0435   CREATININE 3.41* 09/24/2014 0435   CALCIUM 8.4* 09/24/2014 0435   CALCIUM 7.9* 09/20/2014 1315   GFRNONAA 18* 09/24/2014 0435   GFRAA 21* 09/24/2014 0435   CBC    Component Value Date/Time   WBC 6.6 09/24/2014 0435   RBC 3.24*  09/24/2014 0435   HGB 10.0* 09/24/2014 0435   HCT 31.3* 09/24/2014 0435   PLT 224 09/24/2014 0435   MCV 96.6 09/24/2014 0435   MCH 30.9 09/24/2014 0435   MCHC 31.9 09/24/2014 0435   RDW 18.8* 09/24/2014 0435   LYMPHSABS 0.9 08/30/2014 1744   MONOABS 1.0 08/30/2014 1744   EOSABS 0.2 08/30/2014 1744   BASOSABS 0.0 08/30/2014 1744     Assessment:  1. New ESRD -likely 2/2 to blood loss and ATN.  remains volume overloaded. Getting HD. Hypotensive with HD.  2. Severe cardiomyopathy s/p ICD.  3. Abd wall hematoma, improving 4. SP Lt hip fx 5. Anemia on aranesp, received IV iron 6. Sec HPTH not requiring Vit D  PTH 90 on 8/8  Plan: 1. Continue daily HD for now to reduce volume. -HD today.  2. Asked to not take BP meds before HD. Cont milirinone.  3. Compression socks  4. Cont to work with PT. CIR pending.  5. Will work on getting outpt HD spot 6. Renal diet  [name], Tasrif    Renal Attending: As above, we will try to get  volume off with some daily dialysis. Tevan Marian C

## 2014-09-24 NOTE — Progress Notes (Signed)
Nutrition Follow-up  DOCUMENTATION CODES:   Obesity unspecified  INTERVENTION:   -Continue Renal MVI -Continue Nepro Shake po BID, each supplement provides 425 kcal and 19 grams protein  NUTRITION DIAGNOSIS:   Increased nutrient needs related to wound healing as evidenced by estimated needs.  Ongoing  GOAL:   Patient will meet greater than or equal to 90% of their needs  Progressing  MONITOR:   PO intake, Labs, Weight trends, I & O's, Skin  REASON FOR ASSESSMENT:   Consult Assessment of nutrition requirement/status  ASSESSMENT:   64 y.o. Male who complains of left hip pain after sustaining a mechanical fall at home this morning. He presented to the ER for evaluation after being unable to ambulate without pain. Prior to the fall, the patient was abulatory without any assistive devices.   Pt reports appetite is improving and remains good. Noted 50-100% meal completion. Pt reports he consumed all of his breakfast this morning.  He reports he is consuming his Nepro shakes and is amenable to continue them.   Reviewed C[facility]RN note on 09/24/14; wound are improving per note. Pt with stage I to rt heel, stage II to lt heel, and DTI to buttocks. Discussed importance of good nutritional intake to promote healing.   Pt wife requesting educational handouts on pt diet; provided "Nutritional Therapy for CKD stage V" handout from [facility] Nutrition Care Manual.  Discharge disposition is CIR vs SNF.   Labs reviewed: Na: 134 (on IV supplementation).   Diet Order:  Diet renal/carb modified with fluid restriction Diet-HS Snack?: Nothing; Room service appropriate?: Yes; Fluid consistency:: Thin  Skin:  Wound (see comment) (stage II lt heel, stage I rt heel, DTI buttocks)  Last BM:  09/20/14  Height:   Ht Readings from Last 1 Encounters:  09/22/14 5\' 10"  (1.778 m)    Weight:   Wt Readings from Last 1 Encounters:  09/24/14 239 lb 10.2 oz (108.7 kg)    Ideal Body Weight:  75.4  kg  BMI:  Body mass index is 34.38 kg/(m^2).  Estimated Nutritional Needs:   Kcal:  2000-2200  Protein:  105-115 gm  Fluid:  2.0-2.2 L  EDUCATION NEEDS:   Education needs addressed  Bob Eastwood A. Jimmye Norman, RD, LDN, CDE Pager: [phone] After hours Pager: [phone]

## 2014-09-24 NOTE — Progress Notes (Signed)
Occupational Therapy Treatment Patient Details Name: Larry [name] MRN: [id number] DOB: 1950/12/22 Today's Date: 09/24/2014    History of present illness Larry [name] is a 64 y.o. male who complains of left hip pain after sustaining a mechanical fall at home. Pt with left intertrochanteric fx s/p IM nail. PMHx NICM, CAD, DDD, back pain, CVA, obesity. 7/23 CRRT initiated, 7/31 CRRT discontinued.   OT comments  Focus of session on ADL training and education in use of AE for LB ADL. Pt able to tolerate one activity in standing at sink with min guard assist and tendency to lean on sink for support. Progressing steadily.  Follow Up Recommendations  CIR;Supervision/Assistance - 24 hour    Equipment Recommendations  3 in 1 bedside comode    Recommendations for Other Services      Precautions / Restrictions Precautions Precautions: Fall Restrictions Weight Bearing Restrictions: No LLE Weight Bearing: Weight bearing as tolerated       Mobility Bed Mobility               General bed mobility comments: pt in chair, returned to chair  Transfers   Equipment used: Rolling walker (2 wheeled)   Sit to Stand: Supervision         General transfer comment: supervision with verbal cues for technique from recliner    Balance                                   ADL Overall ADL's : Needs assistance/impaired     Grooming: Wash/dry hands;Min guard;Standing Grooming Details (indicate cue type and reason): able to release walker in standing, tendency to lean on sink for support       Lower Body Bathing Details (indicate cue type and reason): instructed in use of long handled sponge for feet and back     Lower Body Dressing: Minimal assistance;With adaptive equipment;Cueing for sequencing;Sitting/lateral leans Lower Body Dressing Details (indicate cue type and reason): instructed in use of reacher, extra wide sock aide and long shoe horn.  Pt not yet able to wear  shoes due to wounds, blisters.                      Vision                     Perception     Praxis      Cognition   Behavior During Therapy: WFL for tasks assessed/performed Overall Cognitive Status: Impaired/Different from baseline Area of Impairment: Memory                     Extremity/Trunk Assessment               Exercises     Shoulder Instructions       General Comments      Pertinent Vitals/ Pain       Pain Assessment: No/denies pain  Home Living                                          Prior Functioning/Environment              Frequency Min 2X/week     Progress Toward Goals  OT Goals(current goals can now be found in the care plan section)  Progress towards OT goals: Progressing toward goals  Acute Rehab OT Goals Patient Stated Goal: return home able to care for himself Time For Goal Achievement: 09/24/14  Plan Discharge plan remains appropriate    Co-evaluation                 End of Session     Activity Tolerance Patient tolerated treatment well   Patient Left in chair;with call bell/phone within reach;with family/visitor present   Nurse Communication          Time: 1030-1050 OT Time Calculation (min): 20 min  Charges: OT General Charges $OT Visit: 1 Procedure OT Treatments $Self Care/Home Management : 8-22 mins  [name] 09/24/2014, 10:56 AM  [phone]

## 2014-09-25 ENCOUNTER — Ambulatory Visit: Payer: Self-pay | Admitting: Pulmonary Disease

## 2014-09-25 LAB — RENAL FUNCTION PANEL
ALBUMIN: 2.8 g/dL — AB (ref 3.5–5.0)
Anion gap: 11 (ref 5–15)
BUN: 26 mg/dL — AB (ref 6–20)
CALCIUM: 8.3 mg/dL — AB (ref 8.9–10.3)
CO2: 27 mmol/L (ref 22–32)
Chloride: 93 mmol/L — ABNORMAL LOW (ref 101–111)
Creatinine, Ser: 3.25 mg/dL — ABNORMAL HIGH (ref 0.61–1.24)
GFR calc Af Amer: 22 mL/min — ABNORMAL LOW (ref >60)
GFR calc non Af Amer: 19 mL/min — ABNORMAL LOW (ref >60)
GLUCOSE: 126 mg/dL — AB (ref 65–99)
PHOSPHORUS: 3.6 mg/dL (ref 2.5–4.6)
Potassium: 4.3 mmol/L (ref 3.5–5.1)
SODIUM: 131 mmol/L — AB (ref 135–145)

## 2014-09-25 LAB — CBC
HCT: 33.1 % — ABNORMAL LOW (ref 39.0–52.0)
HEMATOCRIT: 31.1 % — AB (ref 39.0–52.0)
Hemoglobin: 9.7 g/dL — ABNORMAL LOW (ref 13.0–17.0)
Hemoglobin: 10.3 g/dL — ABNORMAL LOW (ref 13.0–17.0)
MCH: 30.5 pg (ref 26.0–34.0)
MCH: 30.8 pg (ref 26.0–34.0)
MCHC: 31.2 g/dL (ref 30.0–36.0)
MCHC: 31.1 g/dL (ref 30.0–36.0)
MCV: 97.8 fL (ref 78.0–100.0)
MCV: 99.1 fL (ref 78.0–100.0)
PLATELETS: 218 10*3/uL (ref 150–400)
Platelets: 236 10*3/uL (ref 150–400)
RBC: 3.18 MIL/uL — ABNORMAL LOW (ref 4.22–5.81)
RBC: 3.34 MIL/uL — ABNORMAL LOW (ref 4.22–5.81)
RDW: 19.3 % — AB (ref 11.5–15.5)
RDW: 19.7 % — AB (ref 11.5–15.5)
WBC: 6.5 10*3/uL (ref 4.0–10.5)
WBC: 6.6 10*3/uL (ref 4.0–10.5)

## 2014-09-25 LAB — GLUCOSE, CAPILLARY
GLUCOSE-CAPILLARY: 123 mg/dL — AB (ref 65–99)
Glucose-Capillary: 135 mg/dL — ABNORMAL HIGH (ref 65–99)
Glucose-Capillary: 115 mg/dL — ABNORMAL HIGH (ref 65–99)

## 2014-09-25 MED ORDER — LIDOCAINE HCL (PF) 1 % IJ SOLN: 5.0000 mL | INTRAMUSCULAR | Status: DC | PRN

## 2014-09-25 MED ORDER — LIDOCAINE-PRILOCAINE 2.5-2.5 % EX CREA: 1.0000 "application " | TOPICAL_CREAM | CUTANEOUS | Status: DC | PRN

## 2014-09-25 MED ORDER — SODIUM CHLORIDE 0.9 % IV SOLN: 100.0000 mL | INTRAVENOUS | Status: DC | PRN

## 2014-09-25 MED ORDER — ALPRAZOLAM 0.5 MG PO TABS
ORAL_TABLET | ORAL | Status: AC
  Filled 2014-09-25: qty 1

## 2014-09-25 MED ORDER — PENTAFLUOROPROP-TETRAFLUOROETH EX AERO: 1.0000 "application " | INHALATION_SPRAY | CUTANEOUS | Status: DC | PRN

## 2014-09-25 MED ORDER — ALTEPLASE 2 MG IJ SOLR: 2.0000 mg | Freq: Once | INTRAMUSCULAR | Status: DC | PRN

## 2014-09-25 MED ORDER — NEPRO/CARBSTEADY PO LIQD: 237.0000 mL | ORAL | Status: DC | PRN

## 2014-09-25 MED ORDER — ALTEPLASE 2 MG IJ SOLR
2.0000 mg | Freq: Once | INTRAMUSCULAR | Status: AC
  Administered 2014-09-25: 2 mg
  Filled 2014-09-25: qty 2

## 2014-09-25 MED ORDER — HEPARIN SODIUM (PORCINE) 1000 UNIT/ML DIALYSIS
1000.0000 [IU] | INTRAMUSCULAR | Status: DC | PRN
Start: 2014-09-25 — End: 2014-09-25

## 2014-09-25 MED ORDER — HEPARIN SODIUM (PORCINE) 1000 UNIT/ML DIALYSIS: 1000.0000 [IU] | INTRAMUSCULAR | Status: DC | PRN

## 2014-09-25 NOTE — Progress Notes (Signed)
S: denies sob. Ambulation troublesome due to leg pain.  Did well with HD yesterday.   O:BP 119/66 mmHg  Pulse 72  Temp(Src) 97.2 F (36.2 C) (Oral)  Resp 18  Ht 5\' 10"  (1.778 m)  Wt 232 lb 5.8 oz (105.4 kg)  BMI 33.34 kg/m2  SpO2 99%  Intake/Output Summary (Last 24 hours) at 09/25/14 1322 Last data filed at 09/25/14 0913  Gross per 24 hour  Intake    995 ml  Output   2806 ml  Net  -1811 ml   Weight change: 10.6 oz (0.3 kg) Gen: Awake and alert CVS: RRR Resp: mild crackles on bases.  Abd: + BS, NDNT. Ecchymoses on right flank.  Ext: 2+ pitting edmea b/l LE's, has clear tense blisters on the right leg.  NEURO: CNI Ox3 no asterixis Rt IJ permcath Rt Arm PICC   . amiodarone  200 mg Oral Daily  . atorvastatin  40 mg Oral q1800  . bisacodyl  5 mg Oral QHS  . darbepoetin (ARANESP) injection - DIALYSIS  100 mcg Intravenous Q Mon-HD  . DULoxetine  30 mg Oral Daily  . feeding supplement (NEPRO CARB STEADY)  237 mL Oral BID BM  . insulin aspart  0-9 Units Subcutaneous TID AC & HS  . levothyroxine  75 mcg Oral QAC breakfast  . multivitamin  1 tablet Oral QHS  . sildenafil  80 mg Oral TID   Dg Femur Min 2 Views Left  09/24/2014   CLINICAL DATA:  Open reduction internal fixation left femur.  EXAM: LEFT FEMUR 2 VIEWS  COMPARISON:  None.  FINDINGS: ORIF left femur. Hardware intact. No acute bony abnormality. Peripheral vascular calcification present.  IMPRESSION: ORIF left femur .   Electronically Signed   By: Marcello Moores  Register   On: 09/24/2014 11:24   BMET    Component Value Date/Time   NA 131* 09/25/2014 0811   K 4.3 09/25/2014 0811   CL 93* 09/25/2014 0811   CO2 27 09/25/2014 0811   GLUCOSE 126* 09/25/2014 0811   BUN 26* 09/25/2014 0811   CREATININE 3.25* 09/25/2014 0811   CALCIUM 8.3* 09/25/2014 0811   CALCIUM 7.9* 09/20/2014 1315   GFRNONAA 19* 09/25/2014 0811   GFRAA 22* 09/25/2014 0811   CBC    Component Value Date/Time   WBC 6.6 09/25/2014 0811   RBC 3.34*  09/25/2014 0811   HGB 10.3* 09/25/2014 0811   HCT 33.1* 09/25/2014 0811   PLT 236 09/25/2014 0811   MCV 99.1 09/25/2014 0811   MCH 30.8 09/25/2014 0811   MCHC 31.1 09/25/2014 0811   RDW 19.7* 09/25/2014 0811   LYMPHSABS 0.9 08/30/2014 1744   MONOABS 1.0 08/30/2014 1744   EOSABS 0.2 08/30/2014 1744   BASOSABS 0.0 08/30/2014 1744     Assessment:   64 yo M with hxo f DM II, PAF, hypothyroidism, CAD, 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  1. New ESRD -likely 2/2 to blood loss and ATN.  remains volume overloaded. Hard to take out volume b/c of hypotension. Getting HD. Hypotensive with HD.  2. Severe cardiomyopathy s/p ICD.  3. Abd wall hematoma, improving - holding xarelto.  4. SP Lt hip fx 5. Anemia on aranesp, received IV iron 6. Sec HPTH not requiring Vit D  PTH 90 on 8/8  Plan: 1. Continue daily HD for now to reduce volume. -HD today.  2. Asked to not take BP meds before HD. Cards dc/ed his chronic  milirinone 2/2 to NSVT, also off dobutamine.  3. Compression socks   4. Cont to work with PT. SNF pending, not eligible by insurance for CIR.  5. Will work on getting outpt HD spot 6. Renal diet  [name], Tasrif   Renal Attending: Agree with note as articulated above and I examined the pt and reviewed and discussed plans with the Resident Physician above. Pt has a massive amount of fluid on board with bullous formation on RLE today. The swelling in body will limit access placement but will get vein mapping and VVS evaluation.  He will need more fluid off before discharge.  He may need an LTAC. Will continue daily dialysis for volume. Crysta Gulick C  Marjie Chea C

## 2014-09-25 NOTE — Progress Notes (Signed)
PT Cancellation Note  Patient Details Name: Larry [name] MRN: [id number] DOB: 12/28/1950   Cancelled Treatment:    Reason Eval/Treat Not Completed: Patient at procedure or test/unavailable.  Will check back as time allows.   Ramond Dial 09/25/2014, 2:55 PM   Mee Hives, PT MS Acute Rehab Dept. Number: [phone] [id number] and [place] [phone]

## 2014-09-25 NOTE — Progress Notes (Signed)
Patient ID: Larry [name], male   DOB: Aug 04, 1950, 64 y.o.   MRN: [id number] Advanced Heart Failure Rounding Note   Subjective:    Larry [name] is a 64 y/o man with history of chronic systolic CHF (nonischemic cardiomyopathy) s/p 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 He is on chronic milrinone 0.5 mcg.  Prior to admit he was being considered for LVAD.   Discharged 7/16 after dental procedures. Also had RHC with elevate pulmonary pressures. Revatio was increased to 80 mg tid during that stay. Admitted with fall---> broken L  hip. 7/18 had IM Nail L femur.  7/23 started CVVHD, CVVH stopped 7/31.  8/6 began having severe right ab/flank pain and was found to have large retrroperitoneal hematoma 8/7 with transfer to ICU.  Continues on HD daily.  Off anticoagulants using SCDs for potential fistula by VVS.  Complaining of nausea.     Objective:   Weight Range:  Vital Signs:   Temp:  [97.3 F (36.3 C)-98 F (36.7 C)] 98 F (36.7 C) (08/17 0500) Pulse Rate:  [70-76] 70 (08/17 0500) Resp:  [18] 18 (08/17 0500) BP: (92-111)/(47-62) 98/54 mmHg (08/17 0500) SpO2:  [94 %-96 %] 95 % (08/17 0500) Weight:  [231 lb 12.8 oz (105.144 kg)-239 lb 10.2 oz (108.7 kg)] 231 lb 12.8 oz (105.144 kg) (08/17 0500) Last BM Date: 09/20/14  Weight change: Filed Weights   09/24/14 1421 09/24/14 1839 09/25/14 0500  Weight: 239 lb 10.2 oz (108.7 kg) 235 lb 14.3 oz (107 kg) 231 lb 12.8 oz (105.144 kg)    Intake/Output:   Intake/Output Summary (Last 24 hours) at 09/25/14 0754 Last data filed at 09/25/14 0600  Gross per 24 hour  Intake   1655 ml  Output   2756 ml  Net  -1101 ml     Physical Exam: General:  Sitting on the side of the bed. NAD. Wife present.  HEENT: normal Neck: supple. JVP 10. Tunneled HD catheter.  Carotids 2+ bilat; no bruits. No lymphadenopathy or thryomegaly appreciated. Cor: PMI nondisplaced.  Regular rate & rhythm. No rubs, gallops or murmurs. Lungs: clear Abdomen: soft, nondistended. Large hematoma over right flank. + bowel sounds. Extremities: no cyanosis, clubbing, rash. 2+ edema to knees bilaterally with blebs on right lower leg/foot. Neuro: Alert/oriented  Telemetry: NSR 70s  Labs: Basic Metabolic Panel:  Recent Labs Lab 09/20/14 0400  09/21/14 0415 09/22/14 0315 09/23/14 0350 09/24/14 0435  NA 133*  --  133* 132* 131* 130*  K 4.2  --  3.9 4.0 5.9* 4.3  CL 97*  --  98* 96* 96* 94*  CO2 26  --  26 26 21* 27  GLUCOSE 128*  --  121* 117* 118* 126*  BUN 34*  --  26* 26* 45* 34*  CREATININE 3.43*  --  2.69* 2.94* 3.88* 3.41*  CALCIUM 8.1*  < > 8.1* 8.0* 8.4* 8.4*  PHOS 3.7  --   --   --   --   --   < > = values in this interval not displayed.  Liver Function Tests:  Recent Labs Lab 09/20/14 0400  ALBUMIN 2.3*   No results for input(s): LIPASE, AMYLASE in the last 168 hours. No results for input(s): AMMONIA in the last 168 hours.  CBC:  Recent Labs Lab 09/21/14 1638 09/22/14 0315 09/23/14 1440 09/24/14 0435 09/25/14 0500  WBC 7.9 7.8 6.9 6.6 6.5  HGB 9.9* 9.6* 9.9*  10.0* 9.7*  HCT 30.7* 30.5* 31.1* 31.3* 31.1*  MCV 97.2 98.1 96.6 96.6 97.8  PLT 139* 172 200 224 218    Cardiac Enzymes: No results for input(s): CKTOTAL, CKMB, CKMBINDEX, TROPONINI in the last 168 hours.  BNP: BNP (last 3 results)  Recent Labs  05/22/14 1029 05/27/14 1126 06/19/14 1030  BNP 935.2* 1627.8* 738.9*    ProBNP (last 3 results)  Recent Labs  10/09/13 0926 11/14/13 0926 01/14/14 1112  PROBNP 1561.0* 1261.0* 5769.0*      Other results:   Imaging: Dg Femur Min 2 Views Left  09/24/2014   CLINICAL DATA:  Open reduction internal fixation left femur.  EXAM: LEFT FEMUR 2 VIEWS  COMPARISON:  None.  FINDINGS: ORIF left femur. Hardware intact. No acute bony abnormality. Peripheral vascular calcification present.  IMPRESSION: ORIF left femur .   Electronically  Signed   By: [place]   On: 09/24/2014 11:24     Medications:     Scheduled Medications: . amiodarone  200 mg Oral Daily  . atorvastatin  40 mg Oral q1800  . bisacodyl  5 mg Oral QHS  . darbepoetin (ARANESP) injection - DIALYSIS  100 mcg Intravenous Q Mon-HD  . DULoxetine  30 mg Oral Daily  . feeding supplement (NEPRO CARB STEADY)  237 mL Oral BID BM  . insulin aspart  0-9 Units Subcutaneous TID AC & HS  . levothyroxine  75 mcg Oral QAC breakfast  . multivitamin  1 tablet Oral QHS  . sildenafil  80 mg Oral TID    Infusions: . sodium chloride Stopped (09/15/14 1600)  . sodium chloride irrigation      PRN Medications: sodium chloride, sodium chloride, albuterol, ALPRAZolam, alteplase, bisacodyl, feeding supplement (NEPRO CARB STEADY), fentaNYL (SUBLIMAZE) injection, heparin, lidocaine (PF), lidocaine-prilocaine, ondansetron (ZOFRAN) IV, oxyCODONE-acetaminophen **AND** oxyCODONE, pentafluoroprop-tetrafluoroeth, polyethylene glycol, sodium chloride, temazepam   Assessment:   1. Left Hip Fracture s/p IM nail on 08/26/14 2. Chronic systolic CHF- Nonischemic cardiomyopathy, on home milrinone 0.5 mcg/kg/min, decreased to 0.25 here with NSVT. Became hypotensive, transitioned to dobutamine 2.5 => now off dobutamine as well.  3. Acute on chronic renal failure - suspect post-op ATN.  CVVH => HD.  4.  Paroxysmal atrial fibrillation requiring DCCV 02/2014, 04/2014- on chronic amio and xarelto prior to admission. CHADS VASc Score- 5.  Remains in NSR.  5. Diabetes mellitus type 2 6. Depression/anxiety 7. OSA, complex- Needs Bipap at night but had difficulty after dental procedure.  8 Pulmonary HTN - Mixed pulmonary venous hypertension and PAH- on Revatio 80 mg tid 9. Dental extractions 08/2014 by Dr Enrique Sack 10. Obesity 11. Probable chronic respiratory failure - requiring home O2 with ambulation as of this admission 12. Hyponatremia   13. NSVT 14. Delirium- Improved.   15.  Hemorrhagic shock with massive retroperitoneal hematoma 8/6 => off anticoagulation.  16. Pressure ulcer  Plan/Discussion:    Tolerating HD off of dobutamine so far.  He is still volume overloaded on exam and continues to need fluid off via dialysis. Planning for daily HD per Nephrology. Add ted hose.     Hgb stable. Continue SCDs instead of low dose Lovenox.  Will need to leave off anticoagulation for the time being despite CVA risk from atrial fibrillation.  Now that he is off inotropes, we have decreased amiodarone to once daily.  Remains in NSR.   Disposition- Inpatient Rehab Coordinator waiting on decision from insurance. In unable to go CIR will need SNF.    Larry [name],AMY, NP-C  7:54 AM Advanced Heart Failure Team Pager [phone] (M-F; 7a - 4p)  Please contact [facility] for night-coverage after hours (4p -7a ) and weekends on [email or web address]  Patient seen with NP, agree with the above note.  Generally stable but still needs more fluid off via HD.  Awaiting decision on CIR.   Loralie Champagne 09/25/2014 9:13 AM

## 2014-09-25 NOTE — Progress Notes (Signed)
I met with pt at bedside to inform him that [facility] will not approve an inpt rehab admission for pt at this time. They will approve SNF rehab. I will contact his wife to make her aware. RN CM and SW notified. [phone]

## 2014-09-25 NOTE — Progress Notes (Signed)
09/25/2014 1:27 PM Hemodialysis Outpatient Note; per [facility] this patient will need a permanent dialysis access placed before he can start at the center. Thank you. Gordy Savers

## 2014-09-26 ENCOUNTER — Inpatient Hospital Stay (HOSPITAL_COMMUNITY): Payer: Commercial Managed Care - HMO

## 2014-09-26 DIAGNOSIS — N186 End stage renal disease: Secondary | ICD-10-CM

## 2014-09-26 LAB — BASIC METABOLIC PANEL
Anion gap: 6 (ref 5–15)
BUN: 22 mg/dL — AB (ref 6–20)
CHLORIDE: 98 mmol/L — AB (ref 101–111)
CO2: 30 mmol/L (ref 22–32)
CREATININE: 3 mg/dL — AB (ref 0.61–1.24)
Calcium: 8.3 mg/dL — ABNORMAL LOW (ref 8.9–10.3)
GFR calc non Af Amer: 21 mL/min — ABNORMAL LOW (ref >60)
GFR, EST AFRICAN AMERICAN: 24 mL/min — AB (ref >60)
Glucose, Bld: 127 mg/dL — ABNORMAL HIGH (ref 65–99)
POTASSIUM: 3.6 mmol/L (ref 3.5–5.1)
Sodium: 134 mmol/L — ABNORMAL LOW (ref 135–145)

## 2014-09-26 LAB — GLUCOSE, CAPILLARY
GLUCOSE-CAPILLARY: 112 mg/dL — AB (ref 65–99)
GLUCOSE-CAPILLARY: 124 mg/dL — AB (ref 65–99)
Glucose-Capillary: 153 mg/dL — ABNORMAL HIGH (ref 65–99)
Glucose-Capillary: 96 mg/dL (ref 65–99)

## 2014-09-26 NOTE — Progress Notes (Signed)
CSW spoke to patient's wife Larry [name] via telephone to provide update and information re: SNF search. Patient will not be approved per insurance for CIR- thus- SNF search has resumed for patient. CSW re-discussed bed search process as patient was originally referred to SNF with the requirement of Milrinone (now d/c'd).  [name] stated that their preferences would be [facility], [facility] or [facility] if possible.  Updated Fl2 and Clinicals sent to [facility] SNF's this morning.  Patient has not been assigned to a dialysis center as he will need a permanent dialysis cath placed and awaiting stability per MD.  CSW services will continue to monitor and assist with d/c when appropriate.  [name]. [name], [place]

## 2014-09-26 NOTE — Progress Notes (Signed)
Occupational Therapy Treatment Patient Details Name: Larry [name] MRN: [id number] DOB: December 02, 1950 Today's Date: 09/26/2014    History of present illness Larry [name] is a 64 y.o. male who complains of left hip pain after sustaining a mechanical fall at home. Pt with left intertrochanteric fx s/p IM nail. PMHx NICM, CAD, DDD, back pain, CVA, obesity. 7/23 CRRT initiated, 7/31 CRRT discontinued.   OT comments  Pt continues to progress in endurance and ADL independence.  Recalled how to use AE from last visit.  Wife to encourage more independence after observing how little help pt really needs.  Follow Up Recommendations  SNF    Equipment Recommendations  3 in 1 bedside comode;Hospital bed    Recommendations for Other Services      Precautions / Restrictions Precautions Precautions: Fall Restrictions LLE Weight Bearing: Weight bearing as tolerated       Mobility Bed Mobility               General bed mobility comments: sitting EOB  Transfers Overall transfer level: Needs assistance Equipment used: Rolling walker (2 wheeled) Transfers: Sit to/from Stand Sit to Stand: Supervision         General transfer comment: verbal cues for hand placement on walker    Balance                                   ADL Overall ADL's : Needs assistance/impaired     Grooming: Wash/dry hands;Wash/dry face;Applying deodorant;Sitting;Set up   Upper Body Bathing: Minimal assitance;Sitting Upper Body Bathing Details (indicate cue type and reason): assisted for back Lower Body Bathing: Moderate assistance;Sit to/from stand Lower Body Bathing Details (indicate cue type and reason): instructed in use of long handled sponge for feet and back, avoided washing feet due to wounds Upper Body Dressing : Sitting;Minimal assistance   Lower Body Dressing: Minimal assistance;With adaptive equipment;Cueing for sequencing;Sitting/lateral leans Lower Body Dressing Details  (indicate cue type and reason): Practiced use of reacher and extra wide sock aide.  Pt not yet able to wear shoes due to wounds, blisters.     Toileting- Clothing Manipulation and Hygiene: Minimal assistance;Sit to/from stand Toileting - Clothing Manipulation Details (indicate cue type and reason): held gown as pt washed periarea, cues for thoroughness     Functional mobility during ADLs: Min guard;Rolling walker General ADL Comments: Improving endurance for seated ADL.  Tolerated static standing for pericare x 1.5 minutes.      Vision                     Perception     Praxis      Cognition   Behavior During Therapy: [facility] for tasks assessed/performed Overall Cognitive Status: Within Functional Limits for tasks assessed                  General Comments: Pt recalled use of AE for LB ADL with minimal cues from last session.    Extremity/Trunk Assessment               Exercises     Shoulder Instructions       General Comments      Pertinent Vitals/ Pain       Pain Assessment: No/denies pain  Home Living  Prior Functioning/Environment              Frequency Min 2X/week     Progress Toward Goals  OT Goals(current goals can now be found in the care plan section)  Progress towards OT goals: Progressing toward goals  Acute Rehab OT Goals Patient Stated Goal: return home able to care for himself Time For Goal Achievement: 10/10/14 Potential to Achieve Goals: Good  Plan Discharge plan needs to be updated    Co-evaluation                 End of Session Equipment Utilized During Treatment: Rolling walker   Activity Tolerance Patient tolerated treatment well   Patient Left in bed;with call bell/phone within reach;with family/visitor present   Nurse Communication          Time: 9390-3009 OT Time Calculation (min): 45 min  Charges: OT General Charges $OT Visit: 1  Procedure OT Treatments $Self Care/Home Management : 38-52 mins  [name] 09/26/2014, 11:46 AM  [phone]

## 2014-09-26 NOTE — Progress Notes (Addendum)
Patient ID: Larry [name], male   DOB: 08-24-1950, 64 y.o.   MRN: [id number] Advanced Heart Failure Rounding Note   Subjective:    Larry [name] is a 64 y/o man with history of chronic systolic CHF (nonischemic cardiomyopathy) s/p 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 He is on chronic milrinone 0.5 mcg.  Prior to admit he was being considered for LVAD.   Discharged 7/16 after dental procedures. Also had RHC with elevate pulmonary pressures. Revatio was increased to 80 mg tid during that stay. Admitted with fall---> broken L  hip. 7/18 had IM Nail L femur.  7/23 started CVVHD, CVVH stopped 7/31.  8/6 began having severe right ab/flank pain and was found to have large retrroperitoneal hematoma 8/7 with transfer to ICU.  Continues on HD daily.  Off anticoagulants using SCDs for potential fistula by VVS.  Denies SOB.     Objective:   Weight Range:  Vital Signs:   Temp:  [97.2 F (36.2 C)-98 F (36.7 C)] 97.7 F (36.5 C) (08/18 0611) Pulse Rate:  [69-76] 70 (08/18 0611) Resp:  [16-18] 18 (08/18 0611) BP: (95-121)/(51-71) 100/51 mmHg (08/18 0611) SpO2:  [92 %-99 %] 96 % (08/18 0611) Weight:  [227 lb 11.8 oz (103.3 kg)-232 lb 5.8 oz (105.4 kg)] 228 lb 9.6 oz (103.692 kg) (08/18 0611) Last BM Date: 09/20/14  Weight change: Filed Weights   09/25/14 1110 09/25/14 1513 09/26/14 0611  Weight: 232 lb 5.8 oz (105.4 kg) 227 lb 11.8 oz (103.3 kg) 228 lb 9.6 oz (103.692 kg)    Intake/Output:   Intake/Output Summary (Last 24 hours) at 09/26/14 0757 Last data filed at 09/26/14 0617  Gross per 24 hour  Intake    840 ml  Output   2654 ml  Net  -1814 ml     Physical Exam: General:  Sitting on the side of the bed. NAD. Wife present.  HEENT: normal Neck: supple. JVP to jaw . Tunneled HD catheter.  Carotids 2+ bilat; no bruits. No lymphadenopathy or thryomegaly appreciated. Cor: PMI nondisplaced. Regular  rate & rhythm. No rubs, gallops or murmurs. Lungs: clear Abdomen: soft, nondistended. Large hematoma over right flank. + bowel sounds. Extremities: no cyanosis, clubbing, rash. 2+ edema to knees bilaterally with blebs on right lower leg/foot. Neuro: Alert/oriented  Telemetry: NSR 70s  Labs: Basic Metabolic Panel:  Recent Labs Lab 09/20/14 0400  09/22/14 0315 09/23/14 0350 09/24/14 0435 09/25/14 0811 09/26/14 0423  NA 133*  < > 132* 131* 130* 131* 134*  K 4.2  < > 4.0 5.9* 4.3 4.3 3.6  CL 97*  < > 96* 96* 94* 93* 98*  CO2 26  < > 26 21* 27 27 30   GLUCOSE 128*  < > 117* 118* 126* 126* 127*  BUN 34*  < > 26* 45* 34* 26* 22*  CREATININE 3.43*  < > 2.94* 3.88* 3.41* 3.25* 3.00*  CALCIUM 8.1*  < > 8.0* 8.4* 8.4* 8.3* 8.3*  PHOS 3.7  --   --   --   --  3.6  --   < > = values in this interval not displayed.  Liver Function Tests:  Recent Labs Lab 09/20/14 0400 09/25/14 0811  ALBUMIN 2.3* 2.8*   No results for input(s): LIPASE, AMYLASE in the last 168 hours. No results for input(s): AMMONIA in the last 168 hours.  CBC:  Recent Labs Lab 09/22/14 0315 09/23/14 1440 09/24/14 0435  09/25/14 0500 09/25/14 0811  WBC 7.8 6.9 6.6 6.5 6.6  HGB 9.6* 9.9* 10.0* 9.7* 10.3*  HCT 30.5* 31.1* 31.3* 31.1* 33.1*  MCV 98.1 96.6 96.6 97.8 99.1  PLT 172 200 224 218 236    Cardiac Enzymes: No results for input(s): CKTOTAL, CKMB, CKMBINDEX, TROPONINI in the last 168 hours.  BNP: BNP (last 3 results)  Recent Labs  05/22/14 1029 05/27/14 1126 06/19/14 1030  BNP 935.2* 1627.8* 738.9*    ProBNP (last 3 results)  Recent Labs  10/09/13 0926 11/14/13 0926 01/14/14 1112  PROBNP 1561.0* 1261.0* 5769.0*      Other results:   Imaging: Dg Femur Min 2 Views Left  09/24/2014   CLINICAL DATA:  Open reduction internal fixation left femur.  EXAM: LEFT FEMUR 2 VIEWS  COMPARISON:  None.  FINDINGS: ORIF left femur. Hardware intact. No acute bony abnormality. Peripheral vascular  calcification present.  IMPRESSION: ORIF left femur .   Electronically Signed   By: [place]   On: 09/24/2014 11:24     Medications:     Scheduled Medications: . amiodarone  200 mg Oral Daily  . atorvastatin  40 mg Oral q1800  . bisacodyl  5 mg Oral QHS  . darbepoetin (ARANESP) injection - DIALYSIS  100 mcg Intravenous Q Mon-HD  . DULoxetine  30 mg Oral Daily  . feeding supplement (NEPRO CARB STEADY)  237 mL Oral BID BM  . insulin aspart  0-9 Units Subcutaneous TID AC & HS  . levothyroxine  75 mcg Oral QAC breakfast  . multivitamin  1 tablet Oral QHS  . sildenafil  80 mg Oral TID    Infusions: . sodium chloride Stopped (09/15/14 1600)  . sodium chloride irrigation      PRN Medications: sodium chloride, sodium chloride, albuterol, ALPRAZolam, alteplase, bisacodyl, feeding supplement (NEPRO CARB STEADY), fentaNYL (SUBLIMAZE) injection, heparin, lidocaine (PF), lidocaine-prilocaine, ondansetron (ZOFRAN) IV, oxyCODONE-acetaminophen **AND** oxyCODONE, pentafluoroprop-tetrafluoroeth, polyethylene glycol, sodium chloride, temazepam   Assessment:   1. Left Hip Fracture s/p IM nail on 08/26/14 2. Chronic systolic CHF- Nonischemic cardiomyopathy, on home milrinone 0.5 mcg/kg/min, decreased to 0.25 here with NSVT. Became hypotensive, transitioned to dobutamine 2.5 => now off dobutamine as well.  3. Acute on chronic renal failure - suspect post-op ATN.  CVVH => HD.  4.  Paroxysmal atrial fibrillation requiring DCCV 02/2014, 04/2014- on chronic amio and xarelto prior to admission. CHADS VASc Score- 5.  Remains in NSR.  5. Diabetes mellitus type 2 6. Depression/anxiety 7. OSA, complex- Needs Bipap at night but had difficulty after dental procedure.  8 Pulmonary HTN - Mixed pulmonary venous hypertension and PAH- on Revatio 80 mg tid 9. Dental extractions 08/2014 by Dr Enrique Sack 10. Obesity 11. Probable chronic respiratory failure - requiring home O2 with ambulation as of this  admission 12. Hyponatremia   13. NSVT 14. Delirium- Improved.   15. Hemorrhagic shock with massive retroperitoneal hematoma 8/6 => off anticoagulation.  16. Pressure ulcer  Plan/Discussion:    Tolerating HD off of dobutamine so far.  Remains volume overloaded on exam and continues to need fluid off via dialysis. Planning for daily HD per Nephrology. Vein mapping pending. Add ted hose.     Hgb has stable. Continue SCDs instead of low dose Lovenox.  Will need to leave off anticoagulation for the time being despite CVA risk from atrial fibrillation.  Now that he is off inotropes, we have decreased amiodarone to once daily.  Remains in NSR.   Disposition- Social Work actively  following and searching for SNF bed. Can go to SNF when nephrology oks and outpatient HD set up. Nephrology? LTAC for daily HD.    Larry [name],Larry [name], Larry [name]  7:57 AM Advanced Heart Failure Team Pager [phone] (M-F; 7a - 4p)  Please contact [facility] for night-coverage after hours (4p -7a ) and weekends on [email or web address]  Patient seen with NP, agree with the above note.  Still volume overloaded, getting daily HD.  If needed, would be ok to give midodrine prior to dialysis to help maintain BP.  Needs SNF/rehab but if will need ongoing daily HD, may need LTAC Insurance claims handler).   OK to remove PICC.   Loralie Champagne 09/26/2014 1:33 PM

## 2014-09-26 NOTE — Consult Note (Signed)
Hospital Consult    Reason for Consult:  In need of permanent HD access Referring Physician:  Renal service MRN #:  [id number]  History of Present Illness: This is a 64 y.o. male who was seen by Dr. Oneida [name] 10 days ago for a spontaneous retroperitoneal bleed most likely due to Heparin or Xarelto.    He has required a total of 10 units of PRBC's since admission as well as 2 units of FFP.  He has not received a transfusion since 09/18/14.  He is currently not on any blood thinners.  He was admitted on 08/25/14 after a mechanical ground level fall. He was found to have a left femur fracture and underwent IM nailing by Dr. Fredonia [name] on 08/26/14. He had a cardiac cath 7/13 from right femoral approach. He has an extensive past medical history including nonischemic cardiomyopathy on milrinone and s/p St Jude ICD, CAD, paroxysmal AF on xarelto, pulmonary hypertension, CVA, OSA, CKD stage III, and type II diabetes. Prior to this admission, he underwent work up for LVAD with right heart catheterization (via right groin) on 08/21/14. He then underwent multiple dental extractions on 08/22/14. He was discharged home on 08/24/14.   His post-operative course was complicated by AKI and he was started on CRRT. His xarelto was restarted on 08/27/14 (POD 1). He had some nausea and vomiting on 08/29/14 and a KUB showed possible ileus. An NG tube was placed. He had significant improvement in his n/v after having a bowel movement on 7/22. His xarelto was discontinued on 08/31/14 due to worsening kidney function and he was placed on IV heparin.   He developed "sudden" right flank pain and abdominal pain on 09/14/14 and a CT abd/pelvis was performed revealing a right retroperitoneal hemorrhage and hemorrhage in psoas muscle. This was associated with anemia (Hgb 6 on 09/15/14) and he was administered 6 units of pRBCs. He was also hypotensive and started on phenylephrine. His heparin was stopped the morning of 09/15/14. He has not been on  any anticoagulation since that time.  He does have CAD, ischemic cardiomyopathy with EF of 20-25% and does have an ICD on the left and there is a PICC line on the right.   He does have a right IJ diatek cathter in place as well.  He is on insulin for DM.  He does take a beta blocker for HTN.  He is on a statin for cholesterol management.  Past Medical History  Diagnosis Date  . NICM (nonischemic cardiomyopathy)     a. NICM. EF 2011 25-30%. b. LHC 2009 nonobstructive CAD. c. h/o St. Jude ICD 2012. d. Echo 04/2013 - EF 25-30% with mildly decreased RV systolic function. e. Echo 01/2014: EF 20-25%, inf akinesis, RHC. e. Home milrinone started 4/16.  [name] CAD (coronary artery disease)     a. nonobstructive CAD by cath 2009 & 06/2014.  . Obesity   . CVA (cerebral vascular accident)     CVA 2007 without residual deficit  . Pituitary tumor      (nonfunctionging pituitary microadenoma) s/p gamma knife surgery at [facility] 2009 with neuropathy and retinopathy  . Depression   . Erectile dysfunction   . DDD (degenerative disc disease)     Chronic low back pain.   . OSA (obstructive sleep apnea)     a. 4/16 sleep study with severe OSA  . Monoclonal gammopathy   . CKD (chronic kidney disease), stage III   . 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 Hypogonadotropic hypogonadism   .  Polycythemia, secondary     improved/resolved  . Hypercholesterolemia   . Back pain     F/B Dr. Nelva [name]  . Monoclonal gammopathy of unknown significance     per Dr. Mercy [name]  . Neck pain     F/B Dr. Nelva [name]  . PAF (paroxysmal atrial fibrillation) 06/04/10    a. On Xarelto. b. DCCV 02/2014, 04/2014 to NSR.  [name] History of diverticulitis of colon   . Type II or unspecified type diabetes mellitus with neurological manifestations, uncontrolled   . Nonproliferative diabetic retinopathy NOS(362.03)   . Ventricular tachycardia 10/25/13    appropriate ICD shock, VT CL 230-240 msec  . Confusion 02/01/2014  . Hypertension   . AICD  (automatic cardioverter/defibrillator) present   . Anxiety   . Peripheral vascular disease     2007  . Hypothyroidism   . PTSD (post-traumatic stress disorder)     a. Anxiety/PTSD from ICD shock  . Lupus anticoagulant positive     a. No h/o DVT. Saw Dr Larry [name] with hematology.   . Pulmonary hypertension     a. Mixed pulmonary venous hypertension and PAH  . Chronic respiratory failure     a. As of 08/2014 requiring home O2 with ambulation due to desat.    Past Surgical History  Procedure Laterality Date  . Gamma knife surgery for pituitary tumor    . Tonsillectomy    . Cardiac defibrillator placement  02/19/10    By Larry [name].   . Right heart catheterization N/A 09/17/2013    Procedure: RIGHT HEART CATH;  Surgeon: Larry Dresser, MD;  Location: [facility] C[facility] LAB;  Service: Cardiovascular;  Laterality: N/A;  . Cardiac catheterization    . Brain surgery    . Insert / replace / remove pacemaker    . Cardioversion N/A 02/14/2014    Procedure: CARDIOVERSION;  Surgeon: Larry Dresser, MD;  Location: [place];  Service: Cardiovascular;  Laterality: N/A;  . Cardioversion N/A 05/02/2014    Procedure: CARDIOVERSION;  Surgeon: Larry Artist, MD;  Location: [facility] ENDOSCOPY;  Service: Cardiovascular;  Laterality: N/A;  . Right heart catheterization N/A 05/08/2014    Procedure: RIGHT HEART CATH;  Surgeon: Larry Dresser, MD;  Location: [facility] CATH LAB;  Service: Cardiovascular;  Laterality: N/A;  . Cardiac catheterization N/A 06/26/2014    Procedure: Right/Left Heart Cath And Coronary Angiography;  Surgeon: Larry Dresser, MD;  Location: [place] CV LAB;  Service: Cardiovascular;  Laterality: N/A;  . Cardiac catheterization N/A 08/21/2014    Procedure: Right Heart Cath;  Surgeon: Larry Dresser, MD;  Location: [place] CV LAB;  Service: Cardiovascular;  Laterality: N/A;  . Multiple extractions with alveoloplasty N/A 08/22/2014    Procedure: Extraction of toothy #'s  3,5,6,7,9,10,11,12,13,14,20,21,22,23,27,28,29,30 with alveoloplasty and bilateral mandibular tori reductions;  Surgeon: Lenn Cal, DDS;  Location: [place];  Service: Oral Surgery;  Laterality: N/A;  . Cataract extraction    . Femur im nail Left 08/26/2014    Procedure: INTRAMEDULLARY (IM) NAIL FEMORAL;  Surgeon: [name], MD;  Location: [place];  Service: Orthopedics;  Laterality: Left;    Allergies  Allergen Reactions  . Bydureon [Exenatide] Other (See Comments)    sweating  . Losartan Potassium Other (See Comments)    insomnia    Prior to Admission medications   Medication Sig Start Date End Date Taking? Authorizing Provider  ALPRAZolam Duanne Moron) 0.5 MG tablet Take 0.5 mg by mouth See admin instructions. Take 1 tablet (0.5 mg) every night,  may take an additional tablet two more times during the day as needed for anxiety   Yes Historical Provider, MD  amiodarone (PACERONE) 200 MG tablet Take 1 tablet (200 mg total) by mouth daily. 06/19/14  Yes Larry Dresser, MD  atorvastatin (LIPITOR) 40 MG tablet TAKE 1 TABLET DAILY 03/18/14  Yes Larry Artist, MD  bisacodyl (DULCOLAX) 5 MG EC tablet Take 5 mg by mouth at bedtime.   Yes Historical Provider, MD  chlorhexidine (PERIDEX) 0.12 % solution Perform mouth rinses twice daily after breakfast and at bedtime. Patient taking differently: Use as directed in the mouth or throat 2 (two) times daily. Perform mouth rinses twice daily after breakfast and at bedtime (swish and spit) 08/24/14  Yes Dayna N Dunn, PA-C  digoxin (LANOXIN) 0.125 MG tablet Take 0.5 tablets (0.0625 mg total) by mouth daily. 05/01/14  Yes Larry Dresser, MD  DULoxetine (CYMBALTA) 30 MG capsule Take 60 mg by mouth 2 (two) times daily.    Yes Historical Provider, MD  GLUCAGON EMERGENCY 1 MG injection Inject 1 mg as directed once as needed (hypotension).  01/30/14  Yes Historical Provider, MD  hydrALAZINE (APRESOLINE) 100 MG tablet Take 1 tablet (100 mg total) by mouth 3  (three) times daily. 08/24/14  Yes Dayna N Dunn, PA-C  insulin glargine (LANTUS) 100 unit/mL SOPN Inject 30 Units into the skin at bedtime.   Yes Historical Provider, MD  insulin lispro (HUMALOG KWIKPEN) 100 UNIT/ML KiwkPen Inject 30 Units into the skin 3 (three) times daily before meals.   Yes Historical Provider, MD  levothyroxine (SYNTHROID, LEVOTHROID) 75 MCG tablet TAKE 1 TABLET ON AN EMPTY STOMACH 30 MINUTES BEFORE BREAKFAST 07/13/14  Yes Historical Provider, MD  Melatonin 10 MG TABS Take 10 mg by mouth at bedtime as needed (sleep).    Yes Historical Provider, MD  metoprolol succinate (TOPROL-XL) 25 MG 24 hr tablet Take 1 tablet (25 mg total) by mouth 2 (two) times daily before a meal. 05/29/14  Yes Amy D Clegg, NP  milrinone (PRIMACOR) 20 MG/100ML SOLN infusion Inject 51.2 mcg/min into the vein continuous. (0.52mcg/kg/min) 08/24/14  Yes Dayna N Dunn, PA-C  Multiple Vitamin (MULITIVITAMIN WITH MINERALS) TABS Take 1 tablet by mouth daily.   Yes Historical Provider, MD  oxyCODONE-acetaminophen (PERCOCET) 10-325 MG per tablet Take 1 tablet by mouth every 4 (four) hours as needed for pain.  08/08/14  Yes Historical Provider, MD  Potassium Chloride ER 20 MEQ TBCR Take 20 mEq by mouth 2 (two) times daily. 08/15/14  Yes Dayna N Dunn, PA-C  rivaroxaban (XARELTO) 15 MG TABS tablet Take 1 tablet (15 mg total) by mouth daily with supper. 08/13/14  Yes Larry Dresser, MD  sildenafil (REVATIO) 20 MG tablet Take 4 tablets (80 mg total) by mouth 3 (three) times daily. 08/24/14  Yes Dayna N Dunn, PA-C  spironolactone (ALDACTONE) 25 MG tablet TAKE 1 TABLET DAILY 08/14/14  Yes Jettie Booze, MD  torsemide (DEMADEX) 20 MG tablet Take 3 tablets (60 mg total) by mouth 2 (two) times daily. 08/24/14  Yes Dayna N Dunn, PA-C  zolpidem (AMBIEN) 5 MG tablet Take 1 tablet (5 mg total) by mouth at bedtime as needed for sleep. 06/04/14  Yes Larry Dresser, MD  HYDROcodone-acetaminophen (NORCO) 5-325 MG per tablet Take 1-2 tablets by  mouth every 6 (six) hours as needed for moderate pain. 08/26/14   Brittney Claiborne Billings, PA-C  oxyCODONE-acetaminophen (PERCOCET/ROXICET) 5-325 MG per tablet Take 1-2 tablets by mouth every 4-6 hours  as needed for moderate-severe pain. Patient not taking: Reported on 08/25/2014 08/24/14   Charlie Pitter, PA-C    Social History   Social History  . Marital Status: Married    Spouse Name: N/A  . Number of Children: 1  . Years of Education: N/A   Occupational History  . Disabled    Social History Main Topics  . Smoking status: Former Smoker    Types: Cigars    Quit date: 02/09/1983  . Smokeless tobacco: Never Used     Comment: 25 yrs ago  . Alcohol Use: No  . Drug Use: No  . Sexual Activity: No   Other Topics Concern  . Not on file   Social History Narrative   Lives with wife.       Family History  Problem Relation Age of Onset  . Lung cancer    . Cancer Mother     skin  . Dementia Mother   . Depression Mother   . Heart disease Father 63  . Hypertension Father   . Lung cancer Father   . Obesity Father   . Obesity Sister   . Hypertension Sister   . Diabetes Brother   . Hypertension Brother   . Heart disease Brother 50    Died of stroke MI age 58  . Obesity Brother   . Lung cancer Paternal Uncle   . Diabetes Paternal Uncle     requiring leg amputations     ROS: [x]  Positive   [ ]  Negative   [ ]  All sytems reviewed and are negative  Cardiovascular: []  chest pain/pressure []  palpitations [x]  PAF [x]  hx ICD placement (left) []  SOB lying flat []  DOE []  pain in legs while walking []  pain in legs at rest []  pain in legs at night []  non-healing ulcers []  hx of DVT [x]  swelling in legs  Pulmonary: []  productive cough []  asthma/wheezing []  home O2  Neurologic: [x]  weakness in []  arms [x]  legs []  numbness in []  arms []  legs [x]  hx of CVA-completely recovered  []  mini stroke [] difficulty speaking or slurred speech []  temporary loss of vision in one eye []   dizziness  Hematologic: []  hx of cancer []  bleeding problems []  problems with blood clotting easily [x]  spontaneous retroperitoneal bleed  Endocrine:   [x]  diabetes [x]  thyroid disease  GI []  vomiting blood []  blood in stool  GU: [x]  CKD/renal failure [x]  HD--[]  M/W/F or []  T/T/S []  burning with urination []  blood in urine  Psychiatric: [x]  anxiety [x]  PTSD []  depression  Musculoskeletal: []  arthritis []  joint pain  Integumentary: []  rashes []  ulcers  Constitutional: []  fever []  chills   Physical Examination  Filed Vitals:   09/26/14 0948  BP: 113/57  Pulse: 70  Temp: 97 F (36.1 C)  Resp:    Body mass index is 32.8 kg/(m^2).  General:  WDWN in NAD Gait: Not observed HENT: WNL, normocephalic Pulmonary: normal non-labored breathing, without Rales, rhonchi,  wheezing Cardiac: regular, without  Murmurs, rubs or gallops; without carotid bruits Abdomen: significant right flank ecchymosis Vascular Exam/Pulses:  Right Left  Radial 2+ (normal) 2+ (normal)  Ulnar 2+ (normal) 2+ (normal)   Extremities: BLE swelling with blister on dorsum of right foot;  Wound right medial malleolus; bandages left hip from surgery  Musculoskeletal: no muscle wasting or atrophy  Neurologic: A&O X 3; Appropriate Affect ; SENSATION: normal; MOTOR FUNCTION:  moving all extremities equally. Speech is fluent/normal   CBC    Component Value  Date/Time   WBC 6.6 09/25/2014 0811   RBC 3.34* 09/25/2014 0811   HGB 10.3* 09/25/2014 0811   HCT 33.1* 09/25/2014 0811   PLT 236 09/25/2014 0811   MCV 99.1 09/25/2014 0811   MCH 30.8 09/25/2014 0811   MCHC 31.1 09/25/2014 0811   RDW 19.7* 09/25/2014 0811   LYMPHSABS 0.9 08/30/2014 1744   MONOABS 1.0 08/30/2014 1744   EOSABS 0.2 08/30/2014 1744   BASOSABS 0.0 08/30/2014 1744    BMET    Component Value Date/Time   NA 134* 09/26/2014 0423   K 3.6 09/26/2014 0423   CL 98* 09/26/2014 0423   CO2 30 09/26/2014 0423   GLUCOSE 127*  09/26/2014 0423   BUN 22* 09/26/2014 0423   CREATININE 3.00* 09/26/2014 0423   CALCIUM 8.3* 09/26/2014 0423   CALCIUM 7.9* 09/20/2014 1315   GFRNONAA 21* 09/26/2014 0423   GFRAA 24* 09/26/2014 0423    COAGS: Lab Results  Component Value Date   INR 1.18 09/19/2014   INR 1.28 09/18/2014   INR 1.29 09/17/2014     Non-Invasive Vascular Imaging:   Vein mapping 1/47/82: Right Cephalic: Segment Diameter Depth Comment  1. Axilla 3.60mm mm   2. Mid upper arm 3.17mm mm   3. Above AC 5.66mm mm   4. In AC 5.63mm mm   5. Below AC 3.82mm mm   6. Mid forearm 2.70mm mm   7. Wrist 3.77mm mm        Left Cephalic: Segment Diameter Depth Comment  1. Axilla 2.31mm    2. Mid upper arm 2.29mm    3. Above [facility] 2.38mm    4. In [facility]     5. Below AC 3.87mm    6. Mid forearm 2.32mm    7. Wrist 2.22mm          Statin:  Yes.   Beta Blocker:  Yes.   Aspirin:  No. ACEI:  No. ARB:  No. Other antiplatelets/anticoagulants:  No.    ASSESSMENT/PLAN: This is a 64 y.o. male with complicated hospital course who we recently have seen for spontaneous retroperitoneal bleed who is now in need of permanent HD access  -pt's hemoglobin is stable-he has not received any transfusion since 09/18/14. -given that he has a left ICD, he would need HD access on the right despite being right hand dominant.  His PICC line would need to be removed prior to placing access.   -may leave IV in left arm as he cannot have his permanent HD access placed in the left. (previous a-line in left arm) -he is a candidate for right radial cephalic AVF, which could most likely be done early next week. -may not give heparin in OR due to recent retroperitoneal bleed-will d/w MD   Leontine Locket, PA-C [facility] [phone]   I agree with the above.  I have seen and evaluated the patient.  Briefly, this is a 64 year old gentleman in need of permanent  hemodialysis access.  He is right-handed but has a left-sided ICD.  Vein mapping identifies adequate cephalic veins bilaterally.  He previously had a PICC line in his right arm but that was removed yesterday.  He has a catheter in the right internal jugular vein.  I have reviewed his vein mapping.  His cephalic vein on the right is adequate therefore I recommended proceeding with a right arm fistula, most likely a radiocephalic fistula.  I extensively discussed the details of the procedure as well as the risks and benefits.  We discussed about the need for future interventions and possible fistula failure as well as the risk of steal syndrome.  From a scheduling standpoint, this will most likely be done Tuesday Wednesday or Thursday of next week.  This will have to be done without anticoagulation given the patient's recent history.  Annamarie Major

## 2014-09-26 NOTE — Progress Notes (Signed)
Right  Upper Extremity Vein Map    Cephalic  Segment Diameter Depth Comment  1. Axilla 3.8mm mm   2. Mid upper arm 3.25mm mm   3. Above AC 5.60mm mm   4. In AC 5.29mm mm   5. Below AC 3.59mm mm   6. Mid forearm 2.81mm mm   7. Wrist 3.50mm mm    mm mm    mm mm    mm mm    Basilic Unable to visualize   Left Upper Extremity Vein Map    Cephalic  Segment Diameter Depth Comment  1. Axilla 2.43mm    2. Mid upper arm 2.69mm    3. Above [facility] 2.64mm    4. In [facility]     5. Below AC 3.38mm    6. Mid forearm 2.33mm    7. Wrist 2.58mm                    Basilic Unable to visualize.  09/26/2014 10:48 AM Maudry Mayhew, RVT, RDCS, RDMS

## 2014-09-26 NOTE — Progress Notes (Addendum)
S: tolerating HD, still has bullous on the legs. Denies SOB.   O:BP 113/57 mmHg  Pulse 70  Temp(Src) 97 F (36.1 C) (Oral)  Resp 18  Ht 5\' 10"  (1.778 m)  Wt 228 lb 9.6 oz (103.692 kg)  BMI 32.80 kg/m2  SpO2 99%  Intake/Output Summary (Last 24 hours) at 09/26/14 1050 Last data filed at 09/26/14 0617  Gross per 24 hour  Intake    480 ml  Output   2554 ml  Net  -2074 ml   Weight change: -7 lb 4.4 oz (-3.3 kg) Gen: Awake and alert CVS: RRR Resp: mild crackles on bases.  Abd: + BS, NDNT. Ecchymoses on right flank.  Ext: 2+ pitting edmea b/l LE's, somewhat better today, has bullae on the legs.  NEURO: CNI Ox3 no asterixis Rt IJ permcath Rt Arm PICC Left arm has some wounds.    [name] amiodarone  200 mg Oral Daily  . atorvastatin  40 mg Oral q1800  . bisacodyl  5 mg Oral QHS  . darbepoetin (ARANESP) injection - DIALYSIS  100 mcg Intravenous Q Mon-HD  . DULoxetine  30 mg Oral Daily  . feeding supplement (NEPRO CARB STEADY)  237 mL Oral BID BM  . insulin aspart  0-9 Units Subcutaneous TID AC & HS  . levothyroxine  75 mcg Oral QAC breakfast  . multivitamin  1 tablet Oral QHS  . sildenafil  80 mg Oral TID   No results found. BMET    Component Value Date/Time   NA 134* 09/26/2014 0423   K 3.6 09/26/2014 0423   CL 98* 09/26/2014 0423   CO2 30 09/26/2014 0423   GLUCOSE 127* 09/26/2014 0423   BUN 22* 09/26/2014 0423   CREATININE 3.00* 09/26/2014 0423   CALCIUM 8.3* 09/26/2014 0423   CALCIUM 7.9* 09/20/2014 1315   GFRNONAA 21* 09/26/2014 0423   GFRAA 24* 09/26/2014 0423   CBC    Component Value Date/Time   WBC 6.6 09/25/2014 0811   RBC 3.34* 09/25/2014 0811   HGB 10.3* 09/25/2014 0811   HCT 33.1* 09/25/2014 0811   PLT 236 09/25/2014 0811   MCV 99.1 09/25/2014 0811   MCH 30.8 09/25/2014 0811   MCHC 31.1 09/25/2014 0811   RDW 19.7* 09/25/2014 0811   LYMPHSABS 0.9 08/30/2014 1744   MONOABS 1.0 08/30/2014 1744   EOSABS 0.2 08/30/2014 1744   BASOSABS 0.0 08/30/2014 1744      Assessment:   64 yo M with hxo f DM II, PAF, hypothyroidism, CAD, 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  1. New ESRD -likely 2/2 to blood loss and ATN.  remains volume overloaded. Hard to take out volume b/c of hypotension. Getting HD. Hypotensive with HD. Needs permanent HD access. Vein mapping done.  2. Severe cardiomyopathy s/p ICD.  3. Abd wall hematoma, improving - holding xarelto. hgb stable.  4. SP Lt hip fx 5. Anemia on aranesp, received IV iron 6. Sec HPTH not requiring Vit D  PTH 90 on 8/8  Plan: 1. Continue daily HD for now to reduce volume. Consulted [facility] for permanent access. May be difficult given he has Right sided PICC and wounds on the left arm.  2. May benefit from midodrine, will run it by cards before ordering.   3. Compression socks   4. Cont to work with PT. SNF pending, not eligible by insurance for CIR.   Larry [name]   Renal Attending: I have reviewed this patient  with the Resident Physician, examined pt and took a history.  I agree with the plan and evaluation outlined above. Larry [name] C

## 2014-09-27 DIAGNOSIS — N185 Chronic kidney disease, stage 5: Secondary | ICD-10-CM

## 2014-09-27 LAB — GLUCOSE, CAPILLARY
Glucose-Capillary: 104 mg/dL — ABNORMAL HIGH (ref 65–99)
Glucose-Capillary: 104 mg/dL — ABNORMAL HIGH (ref 65–99)
Glucose-Capillary: 116 mg/dL — ABNORMAL HIGH (ref 65–99)
Glucose-Capillary: 120 mg/dL — ABNORMAL HIGH (ref 65–99)

## 2014-09-27 LAB — BASIC METABOLIC PANEL
ANION GAP: 9 (ref 5–15)
BUN: 30 mg/dL — ABNORMAL HIGH (ref 6–20)
CHLORIDE: 97 mmol/L — AB (ref 101–111)
CO2: 26 mmol/L (ref 22–32)
Calcium: 8.5 mg/dL — ABNORMAL LOW (ref 8.9–10.3)
Creatinine, Ser: 3.67 mg/dL — ABNORMAL HIGH (ref 0.61–1.24)
GFR calc non Af Amer: 16 mL/min — ABNORMAL LOW (ref >60)
GFR, EST AFRICAN AMERICAN: 19 mL/min — AB (ref >60)
GLUCOSE: 122 mg/dL — AB (ref 65–99)
POTASSIUM: 3.9 mmol/L (ref 3.5–5.1)
Sodium: 132 mmol/L — ABNORMAL LOW (ref 135–145)

## 2014-09-27 LAB — CBC WITH DIFFERENTIAL/PLATELET
BASOS PCT: 1 % (ref 0–1)
Basophils Absolute: 0.1 10*3/uL (ref 0.0–0.1)
EOS PCT: 6 % — AB (ref 0–5)
Eosinophils Absolute: 0.3 10*3/uL (ref 0.0–0.7)
HEMATOCRIT: 30.9 % — AB (ref 39.0–52.0)
Hemoglobin: 9.7 g/dL — ABNORMAL LOW (ref 13.0–17.0)
LYMPHS PCT: 19 % (ref 12–46)
Lymphs Abs: 1 10*3/uL (ref 0.7–4.0)
MCH: 31.3 pg (ref 26.0–34.0)
MCHC: 31.4 g/dL (ref 30.0–36.0)
MCV: 99.7 fL (ref 78.0–100.0)
MONO ABS: 0.7 10*3/uL (ref 0.1–1.0)
MONOS PCT: 14 % — AB (ref 3–12)
NEUTROS ABS: 3.2 10*3/uL (ref 1.7–7.7)
Neutrophils Relative %: 60 % (ref 43–77)
PLATELETS: 214 10*3/uL (ref 150–400)
RBC: 3.1 MIL/uL — ABNORMAL LOW (ref 4.22–5.81)
RDW: 20.6 % — AB (ref 11.5–15.5)
WBC: 5.2 10*3/uL (ref 4.0–10.5)

## 2014-09-27 LAB — RENAL FUNCTION PANEL
ALBUMIN: 2.5 g/dL — AB (ref 3.5–5.0)
ANION GAP: 8 (ref 5–15)
BUN: 28 mg/dL — AB (ref 6–20)
CALCIUM: 8.3 mg/dL — AB (ref 8.9–10.3)
CO2: 27 mmol/L (ref 22–32)
Chloride: 99 mmol/L — ABNORMAL LOW (ref 101–111)
Creatinine, Ser: 3.07 mg/dL — ABNORMAL HIGH (ref 0.61–1.24)
GFR calc Af Amer: 23 mL/min — ABNORMAL LOW (ref >60)
GFR calc non Af Amer: 20 mL/min — ABNORMAL LOW (ref >60)
GLUCOSE: 119 mg/dL — AB (ref 65–99)
Phosphorus: 3.6 mg/dL (ref 2.5–4.6)
Potassium: 3.4 mmol/L — ABNORMAL LOW (ref 3.5–5.1)
SODIUM: 134 mmol/L — AB (ref 135–145)

## 2014-09-27 MED ORDER — LIDOCAINE HCL (PF) 1 % IJ SOLN: 5.0000 mL | INTRAMUSCULAR | Status: DC | PRN

## 2014-09-27 MED ORDER — SODIUM CHLORIDE 0.9 % IV SOLN: 100.0000 mL | INTRAVENOUS | Status: DC | PRN

## 2014-09-27 MED ORDER — BISACODYL 5 MG PO TBEC
5.0000 mg | DELAYED_RELEASE_TABLET | Freq: Every day | ORAL | Status: DC
  Administered 2014-09-28 – 2014-10-10 (×13): 5 mg via ORAL
  Filled 2014-09-27 (×15): qty 1

## 2014-09-27 MED ORDER — PENTAFLUOROPROP-TETRAFLUOROETH EX AERO: 1.0000 "application " | INHALATION_SPRAY | CUTANEOUS | Status: DC | PRN

## 2014-09-27 MED ORDER — LIDOCAINE-PRILOCAINE 2.5-2.5 % EX CREA: 1.0000 "application " | TOPICAL_CREAM | CUTANEOUS | Status: DC | PRN

## 2014-09-27 MED ORDER — HEPARIN SODIUM (PORCINE) 1000 UNIT/ML DIALYSIS
20.0000 [IU]/kg | INTRAMUSCULAR | Status: DC | PRN
  Filled 2014-09-27: qty 3

## 2014-09-27 MED ORDER — ALTEPLASE 2 MG IJ SOLR
2.0000 mg | Freq: Once | INTRAMUSCULAR | Status: DC | PRN
  Filled 2014-09-27: qty 2

## 2014-09-27 MED ORDER — NEPRO/CARBSTEADY PO LIQD: 237.0000 mL | ORAL | Status: DC | PRN

## 2014-09-27 MED ORDER — HEPARIN SODIUM (PORCINE) 1000 UNIT/ML DIALYSIS
1000.0000 [IU] | INTRAMUSCULAR | Status: DC | PRN
  Filled 2014-09-27: qty 1

## 2014-09-27 NOTE — Progress Notes (Signed)
Physical Therapy Treatment Patient Details Name: Larry [name] MRN: [id number] DOB: 30-Aug-1950 Today's Date: Oct 01, 2014    History of Present Illness Larry [name] is a 64 y.o. male who complains of left hip pain after sustaining a mechanical fall at home. Pt with left intertrochanteric fx s/p IM nail. PMHx NICM, CAD, DDD, back pain, CVA, obesity. 7/23 CRRT initiated, 7/31 CRRT discontinued.    PT Comments    Mr.Gullion continues to progress with gait and endurance. Fatigued after HD and strongly desires set schedule for HD. Educated for HEP and gait with encouragement to continue even in the absence of therapy. Will follow.   Follow Up Recommendations  SNF     Equipment Recommendations       Recommendations for Other Services       Precautions / Restrictions Precautions Precautions: Fall Restrictions LLE Weight Bearing: Weight bearing as tolerated    Mobility  Bed Mobility               General bed mobility comments: in chair on arrival  Transfers Overall transfer level: Modified independent                  Ambulation/Gait Ambulation/Gait assistance: Supervision Ambulation Distance (Feet): 180 Feet Assistive device: Rolling walker (2 wheeled) Gait Pattern/deviations: Step-through pattern;Decreased stride length   Gait velocity interpretation: Below normal speed for age/gender General Gait Details: pt with decreased stride and states he always shuffles, cues for posture and postion in RW with 4 standing rest breaks throughout    Stairs            Wheelchair Mobility    Modified Rankin (Stroke Patients Only)       Balance                                    Cognition Arousal/Alertness: Awake/alert Behavior During Therapy: WFL for tasks assessed/performed Overall Cognitive Status: Within Functional Limits for tasks assessed                      Exercises General Exercises - Lower Extremity Long Arc Quad:  AROM;Seated;Both;15 reps Hip ABduction/ADduction: Seated;AROM;Both;15 reps Hip Flexion/Marching: AROM;Seated;Both;15 reps    General Comments        Pertinent Vitals/Pain Pain Assessment: No/denies pain    Home Living                      Prior Function            PT Goals (current goals can now be found in the care plan section) Progress towards PT goals: Progressing toward goals    Frequency       PT Plan Discharge plan needs to be updated    Co-evaluation             End of Session   Activity Tolerance: Patient tolerated treatment well Patient left: in chair;with call bell/phone within reach     Time: 1322-1340 PT Time Calculation (min) (ACUTE ONLY): 18 min  Charges:  $Gait Training: 8-22 mins                    G Codes:      [name] 10/01/14, 2:28 PM [name], [place]

## 2014-09-27 NOTE — Progress Notes (Signed)
S: tolerating HD, still has bullous formation on the legs. Denies SOB.   O:BP 120/63 mmHg  Pulse 71  Temp(Src) 97 F (36.1 C) (Oral)  Resp 12  Ht 5\' 10"  (1.778 m)  Wt 231 lb 0.7 oz (104.8 kg)  BMI 33.15 kg/m2  SpO2 94%  Intake/Output Summary (Last 24 hours) at 09/27/14 1238 Last data filed at 09/27/14 0721  Gross per 24 hour  Intake   1565 ml  Output    125 ml  Net   1440 ml   Weight change: -1 lb 13.8 oz (-0.846 kg) Gen: Awake and alert CVS: RRR Resp: mild crackles on bases.  Abd: + BS, NDNT. Ecchymoses on right flank.  Ext: 2+ pitting edmea b/l LE's, improving slightly. Has bullous formations on both legs.  NEURO: CNI Ox3 no asterixis Rt IJ permcath Rt Arm PICC Left arm has some wounds.    [name] amiodarone  200 mg Oral Daily  . atorvastatin  40 mg Oral q1800  . bisacodyl  5 mg Oral QHS  . darbepoetin (ARANESP) injection - DIALYSIS  100 mcg Intravenous Q Mon-HD  . DULoxetine  30 mg Oral Daily  . feeding supplement (NEPRO CARB STEADY)  237 mL Oral BID BM  . insulin aspart  0-9 Units Subcutaneous TID AC & HS  . levothyroxine  75 mcg Oral QAC breakfast  . multivitamin  1 tablet Oral QHS  . sildenafil  80 mg Oral TID   No results found. BMET    Component Value Date/Time   NA 134* 09/27/2014 0827   K 3.4* 09/27/2014 0827   CL 99* 09/27/2014 0827   CO2 27 09/27/2014 0827   GLUCOSE 119* 09/27/2014 0827   BUN 28* 09/27/2014 0827   CREATININE 3.07* 09/27/2014 0827   CALCIUM 8.3* 09/27/2014 0827   CALCIUM 7.9* 09/20/2014 1315   GFRNONAA 20* 09/27/2014 0827   GFRAA 23* 09/27/2014 0827   CBC    Component Value Date/Time   WBC 5.2 09/27/2014 0812   RBC 3.10* 09/27/2014 0812   HGB 9.7* 09/27/2014 0812   HCT 30.9* 09/27/2014 0812   PLT 214 09/27/2014 0812   MCV 99.7 09/27/2014 0812   MCH 31.3 09/27/2014 0812   MCHC 31.4 09/27/2014 0812   RDW 20.6* 09/27/2014 0812   LYMPHSABS 1.0 09/27/2014 0812   MONOABS 0.7 09/27/2014 0812   EOSABS 0.3 09/27/2014 0812   BASOSABS 0.1 09/27/2014 6222     Assessment:   64 yo M with hxo f DM II, PAF, hypothyroidism, CAD, 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  1. New ESRD -likely 2/2 to blood loss and ATN.  remains volume overloaded.  Needs permanent HD access. Vein mapping done and VVS consulted. PICC line removed.  2. Severe cardiomyopathy s/p ICD. CHF team on board.  3. Abd wall hematoma, improving - holding xarelto. hgb stable.  4. SP Lt hip fx 5. Anemia on aranesp, received IV iron 6. Sec HPTH not requiring Vit D  PTH 90 on 8/8  Plan: 1. Continue daily HD while he is here to help with volume status. Dialysis access planned by VVS on right arm next week.  2. midorine as needed before HD.  3. Cont to work with PT. Rehab pending.   [name], [name]    Renal Attending: I agree with the note and evaluation as planned above.  I have taken a history today and examined the patient. [place] Hafen C

## 2014-09-27 NOTE — Progress Notes (Signed)
CM following for DCP; Soc Worker following patient for SNF placement when medically stable; B Pennie Rushing [phone]

## 2014-09-27 NOTE — Progress Notes (Signed)
PT Cancellation Note  Patient Details Name: Larry [name] MRN: [id number] DOB: [birth year]   Cancelled Treatment:    Reason Eval/Treat Not Completed: Patient at procedure or test/unavailable (pt currently in HD and unavailable)   [name] 09/27/2014, 10:07 AM [name], [place]

## 2014-09-27 NOTE — Progress Notes (Signed)
Patient ID: Larry [name], male   DOB: 1950/12/28, 64 y.o.   MRN: [id number] Advanced Heart Failure Rounding Note   Subjective:    Larry [name] is a 64 y/o man with history of chronic systolic CHF (nonischemic cardiomyopathy) s/p 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 He is on chronic milrinone 0.5 mcg.  Prior to admit he was being considered for LVAD.   Discharged 7/16 after dental procedures. Also had RHC with elevate pulmonary pressures. Revatio was increased to 80 mg tid during that stay. Admitted with fall---> broken L  hip. 7/18 had IM Nail L femur.  7/23 started CVVHD, CVVH stopped 7/31.  8/6 began having severe right ab/flank pain and was found to have large retrroperitoneal hematoma 8/7 with transfer to ICU. 8/18 VVS consulted . Plan for AVF next week.   Continues on HD daily.  Off anticoagulants using SCDs for potential fistula by VVS.  Denies SOB.     Objective:   Weight Range:  Vital Signs:   Temp:  [97 F (36.1 C)-98.3 F (36.8 C)] 97 F (36.1 C) (08/19 0735) Pulse Rate:  [68-76] 72 (08/19 0830) Resp:  [9-18] 11 (08/19 0830) BP: (99-117)/(53-62) 116/58 mmHg (08/19 0830) SpO2:  [94 %-99 %] 94 % (08/19 0637) Weight:  [230 lb 8 oz (104.554 kg)-231 lb 0.7 oz (104.8 kg)] 231 lb 0.7 oz (104.8 kg) (08/19 0735) Last BM Date: 09/25/14  Weight change: Filed Weights   09/26/14 0611 09/27/14 0637 09/27/14 0735  Weight: 228 lb 9.6 oz (103.692 kg) 230 lb 8 oz (104.554 kg) 231 lb 0.7 oz (104.8 kg)    Intake/Output:   Intake/Output Summary (Last 24 hours) at 09/27/14 0842 Last data filed at 09/27/14 0721  Gross per 24 hour  Intake   2090 ml  Output    125 ml  Net   1965 ml     Physical Exam: General:  Sitting on the side of the bed. NAD. Wife present.  HEENT: normal Neck: supple. JVP to jaw . Tunneled HD catheter.  Carotids 2+ bilat; no bruits. No lymphadenopathy or thryomegaly  appreciated. Cor: PMI nondisplaced. Regular rate & rhythm. No rubs, gallops or murmurs. Lungs: clear Abdomen: soft, nondistended. Large hematoma over right flank. + bowel sounds. Extremities: no cyanosis, clubbing, rash. 2+ edema to knees bilaterally with blebs on right lower leg/foot. Neuro: Alert/oriented  Telemetry: NSR 70s  Labs: Basic Metabolic Panel:  Recent Labs Lab 09/23/14 0350 09/24/14 0435 09/25/14 0811 09/26/14 0423 09/27/14 0341  NA 131* 130* 131* 134* 132*  K 5.9* 4.3 4.3 3.6 3.9  CL 96* 94* 93* 98* 97*  CO2 21* 27 27 30 26   GLUCOSE 118* 126* 126* 127* 122*  BUN 45* 34* 26* 22* 30*  CREATININE 3.88* 3.41* 3.25* 3.00* 3.67*  CALCIUM 8.4* 8.4* 8.3* 8.3* 8.5*  PHOS  --   --  3.6  --   --     Liver Function Tests:  Recent Labs Lab 09/25/14 0811  ALBUMIN 2.8*   No results for input(s): LIPASE, AMYLASE in the last 168 hours. No results for input(s): AMMONIA in the last 168 hours.  CBC:  Recent Labs Lab 09/23/14 1440 09/24/14 0435 09/25/14 0500 09/25/14 0811 09/27/14 0812  WBC 6.9 6.6 6.5 6.6 5.2  NEUTROABS  --   --   --   --  3.2  HGB 9.9* 10.0* 9.7* 10.3* 9.7*  HCT 31.1* 31.3* 31.1* 33.1*  30.9*  MCV 96.6 96.6 97.8 99.1 99.7  PLT 200 224 218 236 214    Cardiac Enzymes: No results for input(s): CKTOTAL, CKMB, CKMBINDEX, TROPONINI in the last 168 hours.  BNP: BNP (last 3 results)  Recent Labs  05/22/14 1029 05/27/14 1126 06/19/14 1030  BNP 935.2* 1627.8* 738.9*    ProBNP (last 3 results)  Recent Labs  10/09/13 0926 11/14/13 0926 01/14/14 1112  PROBNP 1561.0* 1261.0* 5769.0*      Other results:   Imaging: No results found.   Medications:     Scheduled Medications: . amiodarone  200 mg Oral Daily  . atorvastatin  40 mg Oral q1800  . bisacodyl  5 mg Oral QHS  . darbepoetin (ARANESP) injection - DIALYSIS  100 mcg Intravenous Q Mon-HD  . DULoxetine  30 mg Oral Daily  . feeding supplement (NEPRO CARB STEADY)  237 mL  Oral BID BM  . insulin aspart  0-9 Units Subcutaneous TID AC & HS  . levothyroxine  75 mcg Oral QAC breakfast  . multivitamin  1 tablet Oral QHS  . sildenafil  80 mg Oral TID    Infusions: . sodium chloride Stopped (09/15/14 1600)  . sodium chloride irrigation      PRN Medications: sodium chloride, sodium chloride, sodium chloride, sodium chloride, albuterol, ALPRAZolam, alteplase, bisacodyl, feeding supplement (NEPRO CARB STEADY), fentaNYL (SUBLIMAZE) injection, heparin, heparin, lidocaine (PF), lidocaine-prilocaine, ondansetron (ZOFRAN) IV, oxyCODONE-acetaminophen **AND** oxyCODONE, pentafluoroprop-tetrafluoroeth, polyethylene glycol, sodium chloride, temazepam   Assessment:   1. Left Hip Fracture s/p IM nail on 08/26/14 2. Chronic systolic CHF- Nonischemic cardiomyopathy, on home milrinone 0.5 mcg/kg/min, decreased to 0.25 here with NSVT. Became hypotensive, transitioned to dobutamine 2.5 => now off dobutamine as well.  3. Acute on chronic renal failure - suspect post-op ATN.  CVVH => HD.  4.  Paroxysmal atrial fibrillation requiring DCCV 02/2014, 04/2014- on chronic amio and xarelto prior to admission. CHADS VASc Score- 5.  Remains in NSR.  5. Diabetes mellitus type 2 6. Depression/anxiety 7. OSA, complex- Needs Bipap at night but had difficulty after dental procedure.  8 Pulmonary HTN - Mixed pulmonary venous hypertension and PAH- on Revatio 80 mg tid 9. Dental extractions 08/2014 by Dr Enrique Sack 10. Obesity 11. Probable chronic respiratory failure - requiring home O2 with ambulation as of this admission 12. Hyponatremia   13. NSVT 14. Delirium- Improved.   15. Hemorrhagic shock with massive retroperitoneal hematoma 8/6 => off anticoagulation.  16. Pressure ulcer  Plan/Discussion:    Tolerating HD off of dobutamine so far.  Remains volume overloaded on exam and continues to need fluid off via dialysis. Planning for daily HD per Nephrology. Vein mapping pending.  Per VVS plan for  AVF next week. Add ted hose.     Hgb has stable. Continue SCDs instead of low dose Lovenox.  Will need to leave off anticoagulation for the time being despite CVA risk from atrial fibrillation.  Now that he is off inotropes, we have decreased amiodarone to once daily.  Remains in NSR.   Disposition- Social Work actively following and searching for SNF bed. Can go to SNF when nephrology oks and outpatient HD set up. Anticipate D/C next week.    CLEGG,AMY, NP-C  8:42 AM Advanced Heart Failure Team Pager [phone] (M-F; 7a - 4p)  Please contact [facility] for night-coverage after hours (4p -7a ) and weekends on [email or web address]  Patient seen with NP, agree with the above note.  Volume improving with daily HD.  Plan to  place AV fistula next week.  From my standpoint, he can go to SNF when cleared by nephrology, hopefully next week.   Loralie Champagne 09/27/2014 12:45 PM

## 2014-09-28 LAB — CBC
HCT: 33.6 % — ABNORMAL LOW (ref 39.0–52.0)
Hemoglobin: 10.9 g/dL — ABNORMAL LOW (ref 13.0–17.0)
MCH: 31.6 pg (ref 26.0–34.0)
MCHC: 32.4 g/dL (ref 30.0–36.0)
MCV: 97.4 fL (ref 78.0–100.0)
Platelets: 112 10*3/uL — ABNORMAL LOW (ref 150–400)
RBC: 3.45 MIL/uL — ABNORMAL LOW (ref 4.22–5.81)
RDW: 20.4 % — ABNORMAL HIGH (ref 11.5–15.5)
WBC: 6.2 10*3/uL (ref 4.0–10.5)

## 2014-09-28 LAB — GLUCOSE, CAPILLARY
Glucose-Capillary: 119 mg/dL — ABNORMAL HIGH (ref 65–99)
Glucose-Capillary: 123 mg/dL — ABNORMAL HIGH (ref 65–99)
Glucose-Capillary: 119 mg/dL — ABNORMAL HIGH (ref 65–99)
Glucose-Capillary: 112 mg/dL — ABNORMAL HIGH (ref 65–99)

## 2014-09-28 LAB — BASIC METABOLIC PANEL
Anion gap: 8 (ref 5–15)
BUN: 25 mg/dL — ABNORMAL HIGH (ref 6–20)
CALCIUM: 8.3 mg/dL — AB (ref 8.9–10.3)
CO2: 26 mmol/L (ref 22–32)
CREATININE: 3.27 mg/dL — AB (ref 0.61–1.24)
Chloride: 100 mmol/L — ABNORMAL LOW (ref 101–111)
GFR calc Af Amer: 22 mL/min — ABNORMAL LOW (ref >60)
GFR calc non Af Amer: 19 mL/min — ABNORMAL LOW (ref >60)
GLUCOSE: 126 mg/dL — AB (ref 65–99)
Potassium: 3.5 mmol/L (ref 3.5–5.1)
Sodium: 134 mmol/L — ABNORMAL LOW (ref 135–145)

## 2014-09-28 MED ORDER — OXYCODONE-ACETAMINOPHEN 5-325 MG PO TABS
ORAL_TABLET | ORAL | Status: AC
  Filled 2014-09-28: qty 1

## 2014-09-28 MED ORDER — HEPARIN SODIUM (PORCINE) 1000 UNIT/ML DIALYSIS
20.0000 [IU]/kg | INTRAMUSCULAR | Status: DC | PRN
  Filled 2014-09-28: qty 3

## 2014-09-28 MED ORDER — NEPRO/CARBSTEADY PO LIQD: 237.0000 mL | ORAL | Status: DC | PRN

## 2014-09-28 MED ORDER — HEPARIN SODIUM (PORCINE) 1000 UNIT/ML DIALYSIS
1000.0000 [IU] | INTRAMUSCULAR | Status: DC | PRN
  Filled 2014-09-28: qty 1

## 2014-09-28 MED ORDER — ALTEPLASE 2 MG IJ SOLR
2.0000 mg | Freq: Once | INTRAMUSCULAR | Status: DC | PRN
  Filled 2014-09-28: qty 2

## 2014-09-28 MED ORDER — MIDODRINE HCL 5 MG PO TABS
10.0000 mg | ORAL_TABLET | Freq: Three times a day (TID) | ORAL | Status: DC
  Administered 2014-09-29 – 2014-09-30 (×6): 10 mg via ORAL
  Filled 2014-09-28 (×10): qty 2

## 2014-09-28 MED ORDER — SODIUM CHLORIDE 0.9 % IV SOLN: 100.0000 mL | INTRAVENOUS | Status: DC | PRN

## 2014-09-28 MED ORDER — LIDOCAINE-PRILOCAINE 2.5-2.5 % EX CREA: 1.0000 "application " | TOPICAL_CREAM | CUTANEOUS | Status: DC | PRN

## 2014-09-28 MED ORDER — OXYCODONE HCL 5 MG PO TABS
ORAL_TABLET | ORAL | Status: AC
Start: 2014-09-28 — End: 2014-09-29
  Filled 2014-09-28: qty 1

## 2014-09-28 MED ORDER — PENTAFLUOROPROP-TETRAFLUOROETH EX AERO: 1.0000 "application " | INHALATION_SPRAY | CUTANEOUS | Status: DC | PRN

## 2014-09-28 MED ORDER — LIDOCAINE HCL (PF) 1 % IJ SOLN: 5.0000 mL | INTRAMUSCULAR | Status: DC | PRN

## 2014-09-28 NOTE — Progress Notes (Signed)
CSW provided pt and his wife with SNF bed offers.  Wife had questions related to whether/not pt will be LTACH appropriate.  CSW will continue to follow for d/c planning/SNF tx as appropriate.

## 2014-09-28 NOTE — Progress Notes (Signed)
S: tolerated HD yesterday, main complaint is the bullae that hurts when he walks. No sob or cp.  O:BP 115/61 mmHg  Pulse 70  Temp(Src) 97.6 F (36.4 C) (Oral)  Resp 18  Ht 5\' 10"  (1.778 m)  Wt 227 lb 14.4 oz (103.375 kg)  BMI 32.70 kg/m2  SpO2 96%  Intake/Output Summary (Last 24 hours) at 09/28/14 0825 Last data filed at 09/28/14 0527  Gross per 24 hour  Intake    480 ml  Output    100 ml  Net    380 ml   Weight change: 8.7 oz (0.246 kg) Gen: Awake and alert CVS: RRR Resp: CTAB.  Abd: + BS, NDNT. Ecchymoses on right flank.  Ext: 1+ pitting edmea b/l LE's, improving slightly. Has bullous formations on both legs.  NEURO: CNI Ox3 no asterixis Rt IJ permcath Rt Arm PICC Left arm has some wounds.    [name] amiodarone  200 mg Oral Daily  . atorvastatin  40 mg Oral q1800  . bisacodyl  5 mg Oral QHS  . darbepoetin (ARANESP) injection - DIALYSIS  100 mcg Intravenous Q Mon-HD  . DULoxetine  30 mg Oral Daily  . feeding supplement (NEPRO CARB STEADY)  237 mL Oral BID BM  . insulin aspart  0-9 Units Subcutaneous TID AC & HS  . levothyroxine  75 mcg Oral QAC breakfast  . multivitamin  1 tablet Oral QHS  . sildenafil  80 mg Oral TID   No results found. BMET    Component Value Date/Time   NA 134* 09/28/2014 0323   K 3.5 09/28/2014 0323   CL 100* 09/28/2014 0323   CO2 26 09/28/2014 0323   GLUCOSE 126* 09/28/2014 0323   BUN 25* 09/28/2014 0323   CREATININE 3.27* 09/28/2014 0323   CALCIUM 8.3* 09/28/2014 0323   CALCIUM 7.9* 09/20/2014 1315   GFRNONAA 19* 09/28/2014 0323   GFRAA 22* 09/28/2014 0323   CBC    Component Value Date/Time   WBC 5.2 09/27/2014 0812   RBC 3.10* 09/27/2014 0812   HGB 9.7* 09/27/2014 0812   HCT 30.9* 09/27/2014 0812   PLT 214 09/27/2014 0812   MCV 99.7 09/27/2014 0812   MCH 31.3 09/27/2014 0812   MCHC 31.4 09/27/2014 0812   RDW 20.6* 09/27/2014 0812   LYMPHSABS 1.0 09/27/2014 0812   MONOABS 0.7 09/27/2014 0812   EOSABS 0.3 09/27/2014 0812   BASOSABS 0.1 09/27/2014 0347     Assessment:   64 yo M with hxo f DM II, PAF, hypothyroidism, CAD, 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  1. New ESRD -likely 2/2 to blood loss and ATN.  remains volume overloaded still but weight improvign with daily HD.  awaiting permanent HD access. Vein mapping done and VVS consulted.  2. Severe cardiomyopathy s/p ICD. CHF team on board.  3. Abd wall hematoma, improving - holding xarelto. hgb stable.  4. SP Lt hip fx 5. Anemia on aranesp, received IV iron 6. Sec HPTH not requiring Vit D  PTH 90 on 8/8 7. Bullae on legs - 2/2 to volume overload - consulted wound care.   Plan: 1. Continue daily HD while he is here to help with volume status. Will try higher volume today. Dialysis access planned by VVS on right arm next week.  2. Started midodrine 10mg  TID to help with BP and allow higher fluid removal.  3. Cont to work with PT. Rehab pending.   [name], Tasrif  Renal Attending: Trying to get volume off with aggressive dialysis. Will need to prep for OP HD. [place] C

## 2014-09-28 NOTE — Progress Notes (Signed)
Patient  Signed off  Treatment 52 minutes early.  Received 3 hours ten minutes of 4 hour hour treatment .  Reached 1765 ml of 2500 ml goal. Patient had increased anxiety with system clot and refused to be restarted.

## 2014-09-28 NOTE — Progress Notes (Signed)
Patient ID: Larry [name], male   DOB: [birth year], 64 y.o.   MRN: [id number]   Subjective:    Larry [name] is a 64 y/o man with history of chronic systolic CHF (nonischemic cardiomyopathy) s/p St. 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 He is on chronic milrinone 0.5 mcg.  Prior to admit he was being considered for LVAD.   Discharged 7/16 after dental procedures. Also had RHC with elevate pulmonary pressures. Revatio was increased to 80 mg tid during that stay. Admitted with fall---> broken L  hip. 7/18 had IM Nail L femur.  7/23 started CVVHD, CVVH stopped 7/31.  8/6 began having severe right ab/flank pain and was found to have large retrroperitoneal hematoma 8/7 with transfer to ICU. 8/18 VVS consulted . Plan for AVF next week.   Continues on HD daily.  Off anticoagulants using SCDs for potential fistula by VVS.  Denies SOB.     Objective:   Weight Range:  Vital Signs:   Temp:  [97.1 F (36.2 C)-97.6 F (36.4 C)] 97.6 F (36.4 C) (08/20 0516) Pulse Rate:  [65-71] 70 (08/20 0516) Resp:  [13-18] 18 (08/20 0516) BP: (100-115)/(51-65) 115/61 mmHg (08/20 0516) SpO2:  [96 %-99 %] 96 % (08/20 0516) Weight:  [103.375 kg (227 lb 14.4 oz)] 103.375 kg (227 lb 14.4 oz) (08/20 0516) Last BM Date: 09/25/14  Weight change: Filed Weights   09/27/14 0637 09/27/14 0735 09/28/14 0516  Weight: 104.554 kg (230 lb 8 oz) 104.8 kg (231 lb 0.7 oz) 103.375 kg (227 lb 14.4 oz)    Intake/Output:   Intake/Output Summary (Last 24 hours) at 09/28/14 1137 Last data filed at 09/28/14 1058  Gross per 24 hour  Intake    960 ml  Output    100 ml  Net    860 ml     Physical Exam: General:  Sitting on the side of the bed. NAD. Wife present.  HEENT: normal Neck: supple. JVP to jaw . Tunneled HD catheter.  Carotids 2+ bilat; no bruits. No lymphadenopathy or thryomegaly appreciated. Cor: PMI nondisplaced. Regular rate &  rhythm. No rubs, gallops or murmurs.  AICD under left clavicle  Lungs: clear Abdomen: soft, nondistended. Large hematoma over right flank. + bowel sounds. Extremities: no cyanosis, clubbing, rash. 2+ edema to knees bilaterally with blebs on right lower leg/foot. Neuro: Alert/oriented Large Bullae on dorsum right foot   Telemetry: NSR 70s  Labs: Basic Metabolic Panel:  Recent Labs Lab 09/25/14 0811 09/26/14 0423 09/27/14 0341 09/27/14 0827 09/28/14 0323  NA 131* 134* 132* 134* 134*  K 4.3 3.6 3.9 3.4* 3.5  CL 93* 98* 97* 99* 100*  CO2 27 30 26 27 26   GLUCOSE 126* 127* 122* 119* 126*  BUN 26* 22* 30* 28* 25*  CREATININE 3.25* 3.00* 3.67* 3.07* 3.27*  CALCIUM 8.3* 8.3* 8.5* 8.3* 8.3*  PHOS 3.6  --   --  3.6  --     Liver Function Tests:  Recent Labs Lab 09/25/14 0811 09/27/14 0827  ALBUMIN 2.8* 2.5*    CBC:  Recent Labs Lab 09/23/14 1440 09/24/14 0435 09/25/14 0500 09/25/14 0811 09/27/14 0812  WBC 6.9 6.6 6.5 6.6 5.2  NEUTROABS  --   --   --   --  3.2  HGB 9.9* 10.0* 9.7* 10.3* 9.7*  HCT 31.1* 31.3* 31.1* 33.1* 30.9*  MCV 96.6 96.6 97.8 99.1 99.7  PLT 200 224 218 236 214  Cardiac Enzymes: No results for input(s): CKTOTAL, CKMB, CKMBINDEX, TROPONINI in the last 168 hours.  BNP: BNP (last 3 results)  Recent Labs  05/22/14 1029 05/27/14 1126 06/19/14 1030  BNP 935.2* 1627.8* 738.9*    ProBNP (last 3 results)  Recent Labs  10/09/13 0926 11/14/13 0926 01/14/14 1112  PROBNP 1561.0* 1261.0* 5769.0*      Other results:   Imaging: No results found.   Medications:     Scheduled Medications: . amiodarone  200 mg Oral Daily  . atorvastatin  40 mg Oral q1800  . bisacodyl  5 mg Oral QHS  . darbepoetin (ARANESP) injection - DIALYSIS  100 mcg Intravenous Q Mon-HD  . DULoxetine  30 mg Oral Daily  . feeding supplement (NEPRO CARB STEADY)  237 mL Oral BID BM  . insulin aspart  0-9 Units Subcutaneous TID AC & HS  . levothyroxine  75 mcg  Oral QAC breakfast  . midodrine  10 mg Oral TID WC  . multivitamin  1 tablet Oral QHS  . sildenafil  80 mg Oral TID    Infusions: . sodium chloride Stopped (09/15/14 1600)  . sodium chloride irrigation      PRN Medications: albuterol, ALPRAZolam, bisacodyl, fentaNYL (SUBLIMAZE) injection, ondansetron (ZOFRAN) IV, oxyCODONE-acetaminophen **AND** oxyCODONE, polyethylene glycol, sodium chloride, temazepam   Assessment:   1. Left Hip Fracture s/p IM nail on 08/26/14 2. Chronic systolic CHF- Nonischemic cardiomyopathy, on home milrinone 0.5 mcg/kg/min, decreased to 0.25 here with NSVT. Became hypotensive, transitioned to dobutamine 2.5 => now off dobutamine as well.  3. Acute on chronic renal failure - suspect post-op ATN.  CVVH => HD.  4.  Paroxysmal atrial fibrillation requiring DCCV 02/2014, 04/2014- on chronic amio and xarelto prior to admission. CHADS VASc Score- 5.  Remains in NSR.  5. Diabetes mellitus type 2 6. Depression/anxiety 7. OSA, complex- Needs Bipap at night but had difficulty after dental procedure.  8 Pulmonary HTN - Mixed pulmonary venous hypertension and PAH- on Revatio 80 mg tid 9. Dental extractions 08/2014 by Dr Enrique Sack 10. Obesity 11. Probable chronic respiratory failure - requiring home O2 with ambulation as of this admission 12. Hyponatremia   13. NSVT 14. Delirium- Improved.   15. Hemorrhagic shock with massive retroperitoneal hematoma 8/6 => off anticoagulation.  16. Pressure ulcer  Plan/Discussion:    Tolerating HD off of dobutamine so far.  Remains volume overloaded on exam and continues to need fluid off via dialysis. Planning for daily HD per Nephrology. Vein mapping pending.  Per VVS plan for AVF next week. Add ted hose.     Hgb has stable. Continue SCDs instead of low dose Lovenox.  Will need to leave off anticoagulation for the time being despite CVA risk from atrial fibrillation.  Now that he is off inotropes, we have decreased amiodarone to once  daily.  Remains in NSR.   Would have wound care evaluate Bullae on right foot.  For fistula next week VVS     Jenkins Rouge, NP-C  11:37 AM

## 2014-09-28 NOTE — Consult Note (Signed)
WOC wound consult note Reason for Consult: Bullae on lower extremities, R>L. Partial thickness wounds Wound type: Venous insufficiency, pressure Pressure Ulcer POA: No Measurement: Right dorsal foot with 5cm x 7cm intact, serum-filled blister with elevation of 3cm. Left pretibial area with intact, serum-filled blister measuring 2cm x 3cm with 2cm elevation. There are other (much) smaller fluid-filled blisters on the left LE the largest of which measures less than 0.5cm round and with <0.2cm elevation. The left posterior heel presents with a 2cm round x 0.2 partial thickness tissue loss (Stage 2 pressure injury). The patient states that this area is very tender and he is bothered most by this tissue injury. There is no exudate or odor from this wound. Wound bed:As described above. Drainage (amount, consistency, odor) As described above. Periwound:Intact, dry. Dressing procedure/placement/frequency: I have implemented a conservative POC for the bullae; covering with white petrolatum gauze and then padding and protecting until the fluid is either reabsorbed or the bullae rupture and then the focus would change to moisture retentive care to enhance reepithelialization. The heel will be dressed with a soft silicone foam dressing and elevated in a pressure redistribution heel boot while in bed. Thank you for this consultation.  The [place] nursing team will not follow, but will remain available to this patient, the nursing and medical teams.  Please re-consult if needed. Thanks, Maudie Flakes, MSN, RN, [place], [place], [place] [phone])

## 2014-09-29 LAB — BASIC METABOLIC PANEL
Anion gap: 9 (ref 5–15)
BUN: 23 mg/dL — AB (ref 6–20)
CALCIUM: 8.2 mg/dL — AB (ref 8.9–10.3)
CHLORIDE: 98 mmol/L — AB (ref 101–111)
CO2: 25 mmol/L (ref 22–32)
CREATININE: 3.16 mg/dL — AB (ref 0.61–1.24)
GFR calc non Af Amer: 20 mL/min — ABNORMAL LOW (ref >60)
GFR, EST AFRICAN AMERICAN: 23 mL/min — AB (ref >60)
GLUCOSE: 120 mg/dL — AB (ref 65–99)
Potassium: 3.9 mmol/L (ref 3.5–5.1)
Sodium: 132 mmol/L — ABNORMAL LOW (ref 135–145)

## 2014-09-29 LAB — GLUCOSE, CAPILLARY
GLUCOSE-CAPILLARY: 117 mg/dL — AB (ref 65–99)
Glucose-Capillary: 112 mg/dL — ABNORMAL HIGH (ref 65–99)
Glucose-Capillary: 123 mg/dL — ABNORMAL HIGH (ref 65–99)
Glucose-Capillary: 113 mg/dL — ABNORMAL HIGH (ref 65–99)

## 2014-09-29 NOTE — Progress Notes (Signed)
Patient ID: Larry [name], male   DOB: 1950/09/17, 64 y.o.   MRN: [id number]   Subjective:    Larry [name] is a 64 y/o man with history of chronic systolic CHF (nonischemic cardiomyopathy) s/p 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 He is on chronic milrinone 0.5 mcg.  Prior to admit he was being considered for LVAD.   Discharged 7/16 after dental procedures. Also had RHC with elevate pulmonary pressures. Revatio was increased to 80 mg tid during that stay. Admitted with fall---> broken L  hip. 7/18 had IM Nail L femur.  7/23 started CVVHD, CVVH stopped 7/31.  8/6 began having severe right ab/flank pain and was found to have large retrroperitoneal hematoma 8/7 with transfer to ICU. 8/18 VVS consulted . Plan for AVF next week.   Continues on HD daily.  Off anticoagulants using SCDs for potential fistula by VVS.  Denies SOB.   Feet still hurt     Objective:   Weight Range:  Vital Signs:   Temp:  [97 F (36.1 C)-98 F (36.7 C)] 97.5 F (36.4 C) (08/21 0500) Pulse Rate:  [68-75] 70 (08/21 0935) Resp:  [18-20] 20 (08/21 0500) BP: (100-117)/(57-72) 105/59 mmHg (08/21 0935) SpO2:  [93 %-100 %] 95 % (08/21 0500) Weight:  [100.2 kg (220 lb 14.4 oz)-102 kg (224 lb 13.9 oz)] 102 kg (224 lb 13.9 oz) (08/21 0500) Last BM Date: 09/25/14  Weight change: Filed Weights   09/28/14 1419 09/28/14 1801 09/29/14 0500  Weight: 102 kg (224 lb 13.9 oz) 100.2 kg (220 lb 14.4 oz) 102 kg (224 lb 13.9 oz)    Intake/Output:   Intake/Output Summary (Last 24 hours) at 09/29/14 1119 Last data filed at 09/29/14 1016  Gross per 24 hour  Intake    940 ml  Output   1863 ml  Net   -923 ml     Physical Exam: General:  Sitting on the side of the bed. NAD. Wife present.  HEENT: normal Neck: supple. JVP to jaw . Tunneled HD catheter.  Carotids 2+ bilat; no bruits. No lymphadenopathy or thryomegaly appreciated. Cor: PMI  nondisplaced. Regular rate & rhythm. No rubs, gallops or murmurs.  AICD under left clavicle  Lungs: clear Abdomen: soft, nondistended. Large hematoma over right flank. + bowel sounds. Extremities: no cyanosis, clubbing, rash. 2+ edema to knees bilaterally with blebs on right lower leg/foot. Neuro: Alert/oriented Large Bullae on dorsum right foot   Telemetry: NSR 70s  Labs: Basic Metabolic Panel:  Recent Labs Lab 09/25/14 0811 09/26/14 0423 09/27/14 0341 09/27/14 0827 09/28/14 0323 09/29/14 0348  NA 131* 134* 132* 134* 134* 132*  K 4.3 3.6 3.9 3.4* 3.5 3.9  CL 93* 98* 97* 99* 100* 98*  CO2 27 30 26 27 26 25   GLUCOSE 126* 127* 122* 119* 126* 120*  BUN 26* 22* 30* 28* 25* 23*  CREATININE 3.25* 3.00* 3.67* 3.07* 3.27* 3.16*  CALCIUM 8.3* 8.3* 8.5* 8.3* 8.3* 8.2*  PHOS 3.6  --   --  3.6  --   --     Liver Function Tests:  Recent Labs Lab 09/25/14 0811 09/27/14 0827  ALBUMIN 2.8* 2.5*    CBC:  Recent Labs Lab 09/24/14 0435 09/25/14 0500 09/25/14 0811 09/27/14 0812 09/28/14 1827  WBC 6.6 6.5 6.6 5.2 6.2  NEUTROABS  --   --   --  3.2  --   HGB 10.0* 9.7* 10.3* 9.7* 10.9*  HCT 31.3* 31.1* 33.1* 30.9* 33.6*  MCV 96.6 97.8 99.1 99.7 97.4  PLT 224 218 236 214 112*    Cardiac Enzymes: No results for input(s): CKTOTAL, CKMB, CKMBINDEX, TROPONINI in the last 168 hours.  BNP: BNP (last 3 results)  Recent Labs  05/22/14 1029 05/27/14 1126 06/19/14 1030  BNP 935.2* 1627.8* 738.9*    ProBNP (last 3 results)  Recent Labs  10/09/13 0926 11/14/13 0926 01/14/14 1112  PROBNP 1561.0* 1261.0* 5769.0*      Other results:   Imaging: No results found.   Medications:     Scheduled Medications: . amiodarone  200 mg Oral Daily  . atorvastatin  40 mg Oral q1800  . bisacodyl  5 mg Oral QHS  . darbepoetin (ARANESP) injection - DIALYSIS  100 mcg Intravenous Q Mon-HD  . DULoxetine  30 mg Oral Daily  . feeding supplement (NEPRO CARB STEADY)  237 mL Oral BID  BM  . insulin aspart  0-9 Units Subcutaneous TID AC & HS  . levothyroxine  75 mcg Oral QAC breakfast  . midodrine  10 mg Oral TID WC  . multivitamin  1 tablet Oral QHS  . sildenafil  80 mg Oral TID    Infusions: . sodium chloride Stopped (09/15/14 1600)  . sodium chloride irrigation      PRN Medications: sodium chloride, sodium chloride, albuterol, ALPRAZolam, alteplase, bisacodyl, feeding supplement (NEPRO CARB STEADY), fentaNYL (SUBLIMAZE) injection, heparin, heparin, lidocaine (PF), lidocaine-prilocaine, ondansetron (ZOFRAN) IV, oxyCODONE-acetaminophen **AND** oxyCODONE, pentafluoroprop-tetrafluoroeth, polyethylene glycol, sodium chloride, temazepam   Assessment:   1. Left Hip Fracture s/p IM nail on 08/26/14 2. Chronic systolic CHF- Nonischemic cardiomyopathy, on home milrinone 0.5 mcg/kg/min, decreased to 0.25 here with NSVT. Became hypotensive, transitioned to dobutamine 2.5 => now off dobutamine as well.  3. Acute on chronic renal failure - suspect post-op ATN.  CVVH => HD.  4.  Paroxysmal atrial fibrillation requiring DCCV 02/2014, 04/2014- on chronic amio and xarelto prior to admission. CHADS VASc Score- 5.  Remains in NSR.  5. Diabetes mellitus type 2 6. Depression/anxiety 7. OSA, complex- Needs Bipap at night but had difficulty after dental procedure.  8 Pulmonary HTN - Mixed pulmonary venous hypertension and PAH- on Revatio 80 mg tid 9. Dental extractions 08/2014 by Dr Enrique Sack 10. Obesity 11. Probable chronic respiratory failure - requiring home O2 with ambulation as of this admission 12. Hyponatremia   13. NSVT 14. Delirium- Improved.   15. Hemorrhagic shock with massive retroperitoneal hematoma 8/6 => off anticoagulation.  16. Pressure ulcer  Plan/Discussion:    Tolerating HD off of dobutamine so far.  Remains volume overloaded on exam and continues to need fluid off via dialysis. Planning for daily HD per Nephrology. Vein mapping pending.  Per VVS plan for AVF next  week. Add ted hose.     Hgb has stable. Continue SCDs instead of low dose Lovenox.  Will need to leave off anticoagulation for the time being despite CVA risk from atrial fibrillation.  Now that he is off inotropes, we have decreased amiodarone to once daily.  Remains in NSR.   Appreciate wound care consult for Bullae on feet.  Right dorsum may need dressing now that bullae has burst.    He makes very little urine and only way to control volume is with dialysis    Jenkins Rouge, NP-C  11:19 AM

## 2014-09-29 NOTE — Progress Notes (Signed)
Pt refused to have dsg changeds done at this time.  States " I want to sleep, will yo later on this evening."  Notified pt will have it done later as requested."  Pt verbalized understanding.  Karie Kirks, Therapist, sports.

## 2014-09-29 NOTE — Progress Notes (Signed)
S: denies any sob or cp currently, concerned about his bullae on legs. States he had lot of anxiety at the HD session yesterday.  O:BP 105/59 mmHg  Pulse 70  Temp(Src) 97.5 F (36.4 C) (Oral)  Resp 20  Ht 5\' 10"  (1.778 m)  Wt 224 lb 13.9 oz (102 kg)  BMI 32.27 kg/m2  SpO2 95%  Intake/Output Summary (Last 24 hours) at 09/29/14 1335 Last data filed at 09/29/14 1016  Gross per 24 hour  Intake    700 ml  Output   1863 ml  Net  -1163 ml   Weight change: -6 lb 2.8 oz (-2.8 kg) Gen: Awake and alert, overall looks less edematous. CVS: RRR Resp: CTAB.  Abd: + BS, NDNT. Ecchymoses on right flank.   Ext: 1+ pitting edmea b/l LE's, improving slightly. Has bullae on both legs, some has burst.  NEURO: CNI Ox3 no asterixis Rt IJ permcath Rt Arm PICC Left arm has some wounds.    [name] amiodarone  200 mg Oral Daily  . atorvastatin  40 mg Oral q1800  . bisacodyl  5 mg Oral QHS  . darbepoetin (ARANESP) injection - DIALYSIS  100 mcg Intravenous Q Mon-HD  . DULoxetine  30 mg Oral Daily  . feeding supplement (NEPRO CARB STEADY)  237 mL Oral BID BM  . insulin aspart  0-9 Units Subcutaneous TID AC & HS  . levothyroxine  75 mcg Oral QAC breakfast  . midodrine  10 mg Oral TID WC  . multivitamin  1 tablet Oral QHS  . sildenafil  80 mg Oral TID   No results found. BMET    Component Value Date/Time   NA 132* 09/29/2014 0348   K 3.9 09/29/2014 0348   CL 98* 09/29/2014 0348   CO2 25 09/29/2014 0348   GLUCOSE 120* 09/29/2014 0348   BUN 23* 09/29/2014 0348   CREATININE 3.16* 09/29/2014 0348   CALCIUM 8.2* 09/29/2014 0348   CALCIUM 7.9* 09/20/2014 1315   GFRNONAA 20* 09/29/2014 0348   GFRAA 23* 09/29/2014 0348   CBC    Component Value Date/Time   WBC 6.2 09/28/2014 1827   RBC 3.45* 09/28/2014 1827   HGB 10.9* 09/28/2014 1827   HCT 33.6* 09/28/2014 1827   PLT 112* 09/28/2014 1827   MCV 97.4 09/28/2014 1827   MCH 31.6 09/28/2014 1827   MCHC 32.4 09/28/2014 1827   RDW 20.4* 09/28/2014  1827   LYMPHSABS 1.0 09/27/2014 0812   MONOABS 0.7 09/27/2014 0812   EOSABS 0.3 09/27/2014 0812   BASOSABS 0.1 09/27/2014 5176     Assessment:   64 yo M with hxo f DM II, PAF, hypothyroidism, CAD, 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  1. New ESRD -likely 2/2 to blood loss and ATN.  remains volume overloaded still but weight improvign with daily HD.  awaiting permanent HD access. Vein mapping done and VVS consulted, plans to do this coming week (T,Wed, or Th per their note). Has been getting daily HD here for extra volume removal.  2. Severe cardiomyopathy s/p ICD. CHF team on board.  3. Abd wall hematoma, improving - holding xarelto. hgb stable.  4. SP Lt hip fx 5. Anemia on aranesp, received IV iron 6. Sec HPTH not requiring Vit D  PTH 90 on 8/8 7. Bullae on legs - 2/2 to volume overload - consulted wound care.  8. Anxiety -especially at HD sessions - on xanax currently. Follows with outpatient psych.  Plan:  1. He appears less edematous on exam with last few sessions of HD. Will give him a break today from HD and repeat HD Monday.   2. Continue midodrine to support his BP and allow good volume removal.  3. Please consult psych or address his anxiety medication. 4. Cont to work with PT. Rehab pending.   [name], [name]   Renal Attending: FOr AV access this week and continued aggressive dialysis for volume.  BP remains a problem for dialysis efficacy. Alana Dayton C

## 2014-09-29 NOTE — Progress Notes (Signed)
Pt agreed to have dsg changed on Lt upper thigh area.  Noted about 5 stitches in place.  No fresh bleeding noted.  Incoming nurse made aware.  Will continue to monitor.  Karie Kirks, RN

## 2014-09-30 LAB — RENAL FUNCTION PANEL
ANION GAP: 10 (ref 5–15)
Albumin: 2.7 g/dL — ABNORMAL LOW (ref 3.5–5.0)
BUN: 34 mg/dL — ABNORMAL HIGH (ref 6–20)
CALCIUM: 8.1 mg/dL — AB (ref 8.9–10.3)
CHLORIDE: 95 mmol/L — AB (ref 101–111)
CO2: 25 mmol/L (ref 22–32)
Creatinine, Ser: 3.79 mg/dL — ABNORMAL HIGH (ref 0.61–1.24)
GFR calc non Af Amer: 16 mL/min — ABNORMAL LOW (ref >60)
GFR, EST AFRICAN AMERICAN: 18 mL/min — AB (ref >60)
GLUCOSE: 115 mg/dL — AB (ref 65–99)
POTASSIUM: 3.8 mmol/L (ref 3.5–5.1)
Phosphorus: 5.2 mg/dL — ABNORMAL HIGH (ref 2.5–4.6)
SODIUM: 130 mmol/L — AB (ref 135–145)

## 2014-09-30 LAB — GLUCOSE, CAPILLARY
GLUCOSE-CAPILLARY: 107 mg/dL — AB (ref 65–99)
GLUCOSE-CAPILLARY: 102 mg/dL — AB (ref 65–99)
GLUCOSE-CAPILLARY: 107 mg/dL — AB (ref 65–99)
Glucose-Capillary: 99 mg/dL (ref 65–99)

## 2014-09-30 MED ORDER — DEXTROSE 5 % IV SOLN
1.5000 g | INTRAVENOUS | Status: AC
  Administered 2014-10-01: 1.5 g via INTRAVENOUS
  Filled 2014-09-30 (×2): qty 1.5

## 2014-09-30 MED ORDER — DARBEPOETIN ALFA 100 MCG/0.5ML IJ SOSY
PREFILLED_SYRINGE | INTRAMUSCULAR | Status: AC
  Filled 2014-09-30: qty 0.5

## 2014-09-30 MED ORDER — ALPRAZOLAM 0.5 MG PO TABS
0.5000 mg | ORAL_TABLET | Freq: Three times a day (TID) | ORAL | Status: DC | PRN
  Administered 2014-09-30 – 2014-10-06 (×11): 0.5 mg via ORAL
  Filled 2014-09-30 (×13): qty 1

## 2014-09-30 MED ORDER — MIDODRINE HCL 5 MG PO TABS
ORAL_TABLET | ORAL | Status: AC
  Filled 2014-09-30: qty 2

## 2014-09-30 NOTE — Procedures (Signed)
Patient was seen on dialysis and the procedure was supervised. BFR 350 Via RIJ TDC BP is 110/62.  Patient appears to be tolerating treatment well

## 2014-09-30 NOTE — Progress Notes (Signed)
S: denies any sob or cp currently, continues to have lot of anxiety about HD. Asked to increased anxiety meds.  O:BP 113/60 mmHg  Pulse 70  Temp(Src) 97.7 F (36.5 C) (Oral)  Resp 18  Ht 5\' 10"  (1.778 m)  Wt 225 lb 6.4 oz (102.241 kg)  BMI 32.34 kg/m2  SpO2 96%  Intake/Output Summary (Last 24 hours) at 09/30/14 0859 Last data filed at 09/30/14 0858  Gross per 24 hour  Intake    940 ml  Output      0 ml  Net    940 ml   Weight change: 8.5 oz (0.241 kg) Gen: Awake and alert, overall looks less edematous. CVS: RRR Resp: CTAB.  Abd: + BS, NDNT. Ecchymoses on right flank.   Ext: 1+ pitting edmea b/l LE's, improving slightly. Has bullae on both legs, some has burst.  NEURO: CNI Ox3 no asterixis Rt IJ permcath Rt Arm PICC Left arm has some wounds.    [name] amiodarone  200 mg Oral Daily  . atorvastatin  40 mg Oral q1800  . bisacodyl  5 mg Oral QHS  . darbepoetin (ARANESP) injection - DIALYSIS  100 mcg Intravenous Q Mon-HD  . DULoxetine  30 mg Oral Daily  . feeding supplement (NEPRO CARB STEADY)  237 mL Oral BID BM  . insulin aspart  0-9 Units Subcutaneous TID AC & HS  . levothyroxine  75 mcg Oral QAC breakfast  . midodrine  10 mg Oral TID WC  . multivitamin  1 tablet Oral QHS  . sildenafil  80 mg Oral TID   No results found. BMET    Component Value Date/Time   NA 130* 09/30/2014 0318   K 3.8 09/30/2014 0318   CL 95* 09/30/2014 0318   CO2 25 09/30/2014 0318   GLUCOSE 115* 09/30/2014 0318   BUN 34* 09/30/2014 0318   CREATININE 3.79* 09/30/2014 0318   CALCIUM 8.1* 09/30/2014 0318   CALCIUM 7.9* 09/20/2014 1315   GFRNONAA 16* 09/30/2014 0318   GFRAA 18* 09/30/2014 0318   CBC    Component Value Date/Time   WBC 6.2 09/28/2014 1827   RBC 3.45* 09/28/2014 1827   HGB 10.9* 09/28/2014 1827   HCT 33.6* 09/28/2014 1827   PLT 112* 09/28/2014 1827   MCV 97.4 09/28/2014 1827   MCH 31.6 09/28/2014 1827   MCHC 32.4 09/28/2014 1827   RDW 20.4* 09/28/2014 1827   LYMPHSABS 1.0  09/27/2014 0812   MONOABS 0.7 09/27/2014 0812   EOSABS 0.3 09/27/2014 0812   BASOSABS 0.1 09/27/2014 2671     Assessment:   64 yo M with hxo f DM II, PAF, hypothyroidism, CAD, 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  1. New ESRD -likely 2/2 to blood loss and ATN.  remains volume overloaded still but weight improving with daily HD.  awaiting permanent HD access. Vein mapping done and VVS consulted, plans to do this coming week (T,Wed, or Th per their note). Has been getting daily HD here for extra volume removal.  2. Severe cardiomyopathy s/p ICD. CHF team on board.  3. Abd wall hematoma, improving - holding xarelto. hgb stable.  4. SP Lt hip fx 5. Anemia on aranesp, received IV iron 6. Sec HPTH not requiring Vit D  PTH 90 on 8/8 7. Bullae on legs - 2/2 to volume overload - consulted wound care.  8. Anxiety -especially at HD sessions - on xanax currently. Follows with outpatient psych.  Plan:  1.  He had a break in HD on Sunday. Will do HD today.   2. F/up with VVS about AVF placement.  3. Continue midodrine to support his BP and allow good volume removal.  4. Cont to work with PT. Rehab pending.   [name], Tasrif   I have seen and examined this patient and agree with plan as outlined above.  Unfortunately patient is not a candidate for LVAD.  Continue with midodrine and UF as tolerated.  Overall prognosis is guarded. Juriel Cid A,MD 09/30/2014 11:24 AM

## 2014-09-30 NOTE — Progress Notes (Signed)
    Subjective  -   No complaints this am A little anxious about dialysis and surgery   Physical Exam:  Palpable right radial pulse  PICC line on right removed Non-labored breathing     Assessment/Plan:   I again discussed the details of the procedure with the patient and his wife.  He will need a right arm fistula, possible a radio-cephalic but likely a bracheo-cephalic, depending on what the vein looks like on ultrasound in the OR.  He is right handed but has a ICD on the left.  We discussed the risks and benefits, including the possible need for future revisions and fistula failure.  All of his questions have been answered.  This will likely be done without the use of heparin, given his recent RP bleed.  Annamarie Major 09/30/2014 8:26 PM --  Filed Vitals:   09/30/14 1436  BP:   Pulse: 71  Temp: 98.6 F (37 C)  Resp: 16    Intake/Output Summary (Last 24 hours) at 09/30/14 2026 Last data filed at 09/30/14 1700  Gross per 24 hour  Intake    180 ml  Output   3000 ml  Net  -2820 ml     Laboratory CBC    Component Value Date/Time   WBC 6.2 09/28/2014 1827   HGB 10.9* 09/28/2014 1827   HCT 33.6* 09/28/2014 1827   PLT 112* 09/28/2014 1827    BMET    Component Value Date/Time   NA 130* 09/30/2014 0318   K 3.8 09/30/2014 0318   CL 95* 09/30/2014 0318   CO2 25 09/30/2014 0318   GLUCOSE 115* 09/30/2014 0318   BUN 34* 09/30/2014 0318   CREATININE 3.79* 09/30/2014 0318   CALCIUM 8.1* 09/30/2014 0318   CALCIUM 7.9* 09/20/2014 1315   GFRNONAA 16* 09/30/2014 0318   GFRAA 18* 09/30/2014 0318    COAG Lab Results  Component Value Date   INR 1.18 09/19/2014   INR 1.28 09/18/2014   INR 1.29 09/17/2014   No results found for: PTT  Antibiotics Anti-infectives    Start     Dose/Rate Route Frequency Ordered Stop   09/13/14 0855  ceFAZolin (ANCEF) 2-3 GM-% IVPB SOLR    Comments:  Shelton, Whitney   : cabinet override      09/13/14 0855 09/13/14 2114   09/10/14 1500  cefTRIAXone (ROCEPHIN) 1 g in dextrose 5 % 50 mL IVPB  Status:  Discontinued     1 g 100 mL/hr over 30 Minutes Intravenous Every 24 hours 09/10/14 1418 09/17/14 0921   08/26/14 2100  ceFAZolin (ANCEF) IVPB 2 g/50 mL premix     2 g 100 mL/hr over 30 Minutes Intravenous Every 6 hours 08/26/14 1851 08/27/14 0632   08/26/14 0600  ceFAZolin (ANCEF) IVPB 2 g/50 mL premix     2 g 100 mL/hr over 30 Minutes Intravenous To Surgery 08/25/14 2007 08/26/14 1545       V. Leia Alf, M.D. [facility] Office: [phone] Pager:  [phone]

## 2014-09-30 NOTE — Progress Notes (Signed)
Physical Therapy Treatment Patient Details Name: Larry [name] MRN: [id number] DOB: August 26, 1950 Today's Date: 09/30/2014    History of Present Illness Larry [name] is a 64 y.o. male who complains of left hip pain after sustaining a mechanical fall at home. Pt with left intertrochanteric fx s/p IM nail. PMHx NICM, CAD, DDD, back pain, CVA, obesity. 7/23 CRRT initiated, 7/31 CRRT discontinued.    PT Comments    Busy morning with lots of consultants (including me with PT) prior to HD; Still willing to do some work despite reporting feeling overwhelmed with medical issues; showing increasing activiy ttolerance, with a lot of upright standing this session, even if we did not incr amb distance;   Likely to OR tomorrow for AV fistula creation.  Follow Up Recommendations  SNF     Equipment Recommendations  Rolling walker with 5" wheels;3in1 (PT)    Recommendations for Other Services       Precautions / Restrictions Precautions Precautions: Fall Restrictions LLE Weight Bearing: Weight bearing as tolerated    Mobility  Bed Mobility Overal bed mobility: Needs Assistance Bed Mobility: Sit to Supine       Sit to supine: Mod assist (for LEs and cues for technique)   General bed mobility comments: Mod assist to help LEs onto bed  Transfers Overall transfer level: Needs assistance Equipment used: Rolling walker (2 wheeled) Transfers: Sit to/from Stand Sit to Stand: Min assist         General transfer comment: verbal cues for hand placement on walker; requires min assist to steady RW as he tends to pull up on RW despite cues  Ambulation/Gait Ambulation/Gait assistance: Min guard (with and without physical contact) Ambulation Distance (Feet): 20 Feet (to and from bathroom) Assistive device: Rolling walker (2 wheeled) Gait Pattern/deviations: Step-to pattern   Gait velocity interpretation: Below normal speed for age/gender General Gait Details: Limited distance today due  to pain and imminent transfer to HD; agreeable to get up and walk to the bathroom; one long standing rest break as Dr. Trula Slade answered questions re: to AV fistula creation   Stairs            Wheelchair Mobility    Modified Rankin (Stroke Patients Only)       Balance             Standing balance-Leahy Scale: Fair                      Cognition Arousal/Alertness: Awake/alert Behavior During Therapy: WFL for tasks assessed/performed Overall Cognitive Status: Within Functional Limits for tasks assessed                      Exercises      General Comments        Pertinent Vitals/Pain Pain Assessment: 0-10 Pain Score: 7  Pain Location: L hip/femur Pain Descriptors / Indicators: Aching;Burning Pain Intervention(s): Limited activity within patient's tolerance;Monitored during session;Premedicated before session;Repositioned    Home Living                      Prior Function            PT Goals (current goals can now be found in the care plan section) Acute Rehab PT Goals Patient Stated Goal: return home able to care for himself PT Goal Formulation: With patient Time For Goal Achievement: 10/04/14 Potential to Achieve Goals: Good Progress towards PT goals: Progressing toward goals  Frequency  Min 3X/week    PT Plan Current plan remains appropriate    Co-evaluation             End of Session   Activity Tolerance: Patient tolerated treatment well Patient left: in bed;with nursing/sitter in room;with family/visitor present (about to go to HD)     Time: 6010-9323 (minus appros 5-7 minutes for Vac Surgery consult) PT Time Calculation (min) (ACUTE ONLY): 32 min  Charges:  $Gait Training: 8-22 mins                    G Codes:      Roney Marion Hamff 09/30/2014, 10:14 AM  Roney Marion, [place] Pager [phone] Office [phone]

## 2014-09-30 NOTE — Progress Notes (Signed)
Patient ID: Larry [name], male   DOB: [birth year], 64 y.o.   MRN: [id number]   Subjective:    Larry [name] is a 63 y/o man with history of chronic systolic CHF (nonischemic cardiomyopathy) s/p 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 He is on chronic milrinone 0.5 mcg.  Prior to admit he was being considered for LVAD.   Discharged 7/16 after dental procedures. Also had RHC with elevate pulmonary pressures. Revatio was increased to 80 mg tid during that stay. Admitted with fall---> broken L  hip. 7/18 had IM Nail L femur.  7/23 started CVVHD, CVVH stopped 7/31.  8/6 began having severe right ab/flank pain and was found to have large retrroperitoneal hematoma 8/7 with transfer to ICU. 8/18 VVS consulted . Plan for AVF next week.   Continues on HD daily.  Off anticoagulants using SCDs for potential fistula by VVas  Nauseated today. Denies SOB.   Feet still hurt     Objective:   Weight Range:  Vital Signs:   Temp:  [97.3 F (36.3 C)-97.7 F (36.5 C)] 97.7 F (36.5 C) (08/22 0615) Pulse Rate:  [70-75] 70 (08/21 2136) Resp:  [18-20] 18 (08/22 0615) BP: (101-113)/(46-60) 113/60 mmHg (08/22 0615) SpO2:  [96 %-100 %] 96 % (08/22 0615) Weight:  [225 lb 6.4 oz (102.241 kg)] 225 lb 6.4 oz (102.241 kg) (08/22 0615) Last BM Date: 09/25/14  Weight change: Filed Weights   09/28/14 1801 09/29/14 0500 09/30/14 0615  Weight: 220 lb 14.4 oz (100.2 kg) 224 lb 13.9 oz (102 kg) 225 lb 6.4 oz (102.241 kg)    Intake/Output:   Intake/Output Summary (Last 24 hours) at 09/30/14 0747 Last data filed at 09/29/14 1837  Gross per 24 hour  Intake    880 ml  Output      0 ml  Net    880 ml     Physical Exam: General:  Sitting in chair. NAD. Wife present.  HEENT: normal Neck: supple. JVP 8 cm. Tunneled HD catheter.  Carotids 2+ bilat; no bruits. No lymphadenopathy or thryomegaly appreciated. Cor: PMI nondisplaced.  Regular rate & rhythm. No rubs, gallops or murmurs.  AICD under left clavicle  Lungs: clear Abdomen: soft, nondistended. Large hematoma over right flank. + bowel sounds. Extremities: no cyanosis, clubbing, rash. 2+ edema to knees bilaterally with blebs on right lower leg/foot. 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  Liver Function Tests:  Recent Labs Lab 09/25/14 0811 09/27/14 0827 09/30/14 0318  ALBUMIN 2.8* 2.5* 2.7*    CBC:  Recent Labs Lab 09/24/14 0435 09/25/14  0500 09/25/14 0811 09/27/14 0812 09/28/14 1827  WBC 6.6 6.5 6.6 5.2 6.2  NEUTROABS  --   --   --  3.2  --   HGB 10.0* 9.7* 10.3* 9.7* 10.9*  HCT 31.3* 31.1* 33.1* 30.9* 33.6*  MCV 96.6 97.8 99.1 99.7 97.4  PLT 224 218 236 214 112*    Cardiac Enzymes: No results for input(s): CKTOTAL, CKMB, CKMBINDEX, TROPONINI in the last 168 hours.  BNP: BNP (last 3 results)  Recent Labs  05/22/14 1029 05/27/14 1126 06/19/14 1030  BNP 935.2* 1627.8* 738.9*    ProBNP (last 3 results)  Recent Labs  10/09/13 0926 11/14/13 0926 01/14/14 1112  PROBNP 1561.0* 1261.0* 5769.0*      Other results:   Imaging: No results found.   Medications:     Scheduled Medications: . amiodarone  200 mg Oral Daily  . atorvastatin  40 mg Oral q1800  . bisacodyl  5 mg Oral QHS  . darbepoetin (ARANESP) injection - DIALYSIS  100 mcg  Intravenous Q Mon-HD  . DULoxetine  30 mg Oral Daily  . feeding supplement (NEPRO CARB STEADY)  237 mL Oral BID BM  . insulin aspart  0-9 Units Subcutaneous TID AC & HS  . levothyroxine  75 mcg Oral QAC breakfast  . midodrine  10 mg Oral TID WC  . multivitamin  1 tablet Oral QHS  . sildenafil  80 mg Oral TID    Infusions: . sodium chloride Stopped (09/15/14 1600)  . sodium chloride irrigation      PRN Medications: sodium chloride, sodium chloride, albuterol, ALPRAZolam, alteplase, bisacodyl, feeding supplement (NEPRO CARB STEADY), fentaNYL (SUBLIMAZE) injection, heparin, heparin, lidocaine (PF), lidocaine-prilocaine, ondansetron (ZOFRAN) IV, oxyCODONE-acetaminophen **AND** oxyCODONE, pentafluoroprop-tetrafluoroeth, polyethylene glycol, sodium chloride, temazepam   Assessment:   1. Left Hip Fracture s/p IM nail on 08/26/14 2. Chronic systolic CHF- Nonischemic cardiomyopathy, on home milrinone 0.5 mcg/kg/min, decreased to 0.25 here with NSVT. Became hypotensive, transitioned to dobutamine 2.5 => now off dobutamine as well.  3. Acute on chronic renal failure - suspect post-op ATN.  CVVH => HD.  4.  Paroxysmal atrial fibrillation requiring DCCV 02/2014, 04/2014- on chronic amio and xarelto prior to admission. CHADS VASc Score- 5.  Remains in NSR.  5. Diabetes mellitus type 2 6. Depression/anxiety 7. OSA, complex- Needs Bipap at night but had difficulty after dental procedure.  8 Pulmonary HTN - Mixed pulmonary venous hypertension and PAH- on Revatio 80 mg tid 9. Dental extractions 08/2014 by Dr Enrique Sack 10. Obesity 11. Probable chronic respiratory failure - requiring home O2 with ambulation as of this admission 12. Hyponatremia   13. NSVT 14. Delirium- Improved.   15. Hemorrhagic shock with massive retroperitoneal hematoma 8/6 => off anticoagulation.  16. Pressure ulcer  Plan/Discussion:    Tolerating HD. No longer on intotropes.  Remains volume overloaded on exam and continues to  need fluid off via dialysis. Planning for HD today.  Per VVS plan for AVF tomorrow. Unable to use ted hose with bulla.      Last hgb stable. Continue SCDs instead of low dose Lovenox.  Will need to leave off anticoagulation for the time being despite CVA risk from atrial fibrillation.  Now that he is off inotropes, amio cut back to once daily. Remains in NSR.   Appreciate wound care consult for Bullae on feet.  Right dorsum may need dressing now that bullae has burst.      CLEGG,AMY, NP-C  7:47 AM   Patient seen with NP, agree  with the above note.  Plan for HD today, plan for AVF placement ?tomorrow by VVS.  Once AV fistula is in place, suspect he can go to either SNF or LTAC.  Will contact Education officer, museum.   He is off anticoagulation with RP hematoma.  After a few weeks, will aim for starting coumadin aiming for INR 2-2.5.   Loralie Champagne 09/30/2014 8:08 AM

## 2014-09-30 NOTE — Progress Notes (Signed)
Orthopedic Tech Progress Note Patient Details:  Larry [name] Nov 11, 1950 [id number]  Ortho Devices Type of Ortho Device: Louretta Parma boot Ortho Device/Splint Location: bilateral unna boots Ortho Device/Splint Interventions: Application   [name], [name] 09/30/2014, 2:12 PM

## 2014-09-30 NOTE — Progress Notes (Signed)
Pt with left hip dsg from surgery changed yesterday.  Informed Dr Haroldine Laws of surgical site still  With stitches.  Instructed that ortho had seen pt last week.  Will continue to monitor.  Karie Kirks, Therapist, sports.

## 2014-09-30 NOTE — Consult Note (Addendum)
WOC wound follow up Wound type: WOC consult for BLE was performed on 8/20, refer to previous progress notes.  Discussed plan of care with heart failure team, requested to apply compression wraps to control edema. Measurement: Right previous bulla to anterior foot has ruptured and drained large amt yellow fluid.  Left anterior foot bulla ruptures easily when touched, drained large amt yellow fluid.  Posterior heel with previous stage 2 pressure injury has also ruptured and drained large amt yellow fluid.  All sites with skin approximated over wounds. Periwound: BLE with generalized edema, few scattered yellow-fluid filled blisters to calf areas. Buttocks without any further wounds; all previous sites are dry and healed, some patchy areas of dry peeling skin. Dressing procedure/placement/frequency: Applied foam dressing to all ruptured blisters. Pt and wife at bedside understand the importance of offloading pressure to left heel to promote healing. Paged ortho tech to apply bilat Una boots and Coban over foam dressings and change Q Mon and Thurs.  Discussed plan of care with patient and wife and they deny further questions.  Pt will need home health assistance after discharge for twice weekly compression wrap changes. Please re-consult if further assistance is needed.  Thank-you,  Julien Girt MSN, [place], [place], [place], [place]

## 2014-10-01 ENCOUNTER — Inpatient Hospital Stay (HOSPITAL_COMMUNITY): Payer: Commercial Managed Care - HMO | Admitting: Anesthesiology

## 2014-10-01 ENCOUNTER — Encounter (HOSPITAL_COMMUNITY): Payer: Self-pay | Admitting: Anesthesiology

## 2014-10-01 ENCOUNTER — Encounter (HOSPITAL_COMMUNITY)
Admission: EM | Disposition: A | Discharge status: Skilled Nursing Facility | Payer: Self-pay | Source: Home / Self Care | Attending: Internal Medicine

## 2014-10-01 HISTORY — PX: AV FISTULA PLACEMENT: SHX1204

## 2014-10-01 LAB — CBC
HCT: 33.3 % — ABNORMAL LOW (ref 39.0–52.0)
HEMOGLOBIN: 10.4 g/dL — AB (ref 13.0–17.0)
MCH: 30.8 pg (ref 26.0–34.0)
MCHC: 31.2 g/dL (ref 30.0–36.0)
MCV: 98.5 fL (ref 78.0–100.0)
PLATELETS: 157 10*3/uL (ref 150–400)
RBC: 3.38 MIL/uL — AB (ref 4.22–5.81)
RDW: 20.4 % — ABNORMAL HIGH (ref 11.5–15.5)
WBC: 5.3 10*3/uL (ref 4.0–10.5)

## 2014-10-01 LAB — GLUCOSE, CAPILLARY
GLUCOSE-CAPILLARY: 111 mg/dL — AB (ref 65–99)
Glucose-Capillary: 99 mg/dL (ref 65–99)
Glucose-Capillary: 128 mg/dL — ABNORMAL HIGH (ref 65–99)
Glucose-Capillary: 120 mg/dL — ABNORMAL HIGH (ref 65–99)
Glucose-Capillary: 97 mg/dL (ref 65–99)

## 2014-10-01 LAB — RENAL FUNCTION PANEL
ALBUMIN: 2.9 g/dL — AB (ref 3.5–5.0)
ALBUMIN: 2.8 g/dL — AB (ref 3.5–5.0)
ANION GAP: 10 (ref 5–15)
ANION GAP: 9 (ref 5–15)
BUN: 25 mg/dL — ABNORMAL HIGH (ref 6–20)
BUN: 27 mg/dL — ABNORMAL HIGH (ref 6–20)
CALCIUM: 8.5 mg/dL — AB (ref 8.9–10.3)
CALCIUM: 8.7 mg/dL — AB (ref 8.9–10.3)
CO2: 25 mmol/L (ref 22–32)
CO2: 26 mmol/L (ref 22–32)
CREATININE: 3.22 mg/dL — AB (ref 0.61–1.24)
CREATININE: 3.4 mg/dL — AB (ref 0.61–1.24)
Chloride: 99 mmol/L — ABNORMAL LOW (ref 101–111)
Chloride: 98 mmol/L — ABNORMAL LOW (ref 101–111)
GFR calc non Af Amer: 19 mL/min — ABNORMAL LOW (ref >60)
GFR calc non Af Amer: 18 mL/min — ABNORMAL LOW (ref >60)
GFR, EST AFRICAN AMERICAN: 22 mL/min — AB (ref >60)
GFR, EST AFRICAN AMERICAN: 21 mL/min — AB (ref >60)
GLUCOSE: 103 mg/dL — AB (ref 65–99)
GLUCOSE: 112 mg/dL — AB (ref 65–99)
PHOSPHORUS: 4.4 mg/dL (ref 2.5–4.6)
PHOSPHORUS: 5.2 mg/dL — AB (ref 2.5–4.6)
Potassium: 4.2 mmol/L (ref 3.5–5.1)
Potassium: 4.5 mmol/L (ref 3.5–5.1)
SODIUM: 134 mmol/L — AB (ref 135–145)
SODIUM: 133 mmol/L — AB (ref 135–145)

## 2014-10-01 LAB — PROTIME-INR
INR: 1.21 (ref 0.00–1.49)
PROTHROMBIN TIME: 15.5 s — AB (ref 11.6–15.2)

## 2014-10-01 SURGERY — ARTERIOVENOUS (AV) FISTULA CREATION
Anesthesia: Monitor Anesthesia Care | Site: Arm Lower | Laterality: Right

## 2014-10-01 MED ORDER — PENTAFLUOROPROP-TETRAFLUOROETH EX AERO: 1.0000 "application " | INHALATION_SPRAY | CUTANEOUS | Status: DC | PRN

## 2014-10-01 MED ORDER — 0.9 % SODIUM CHLORIDE (POUR BTL) OPTIME
TOPICAL | Status: DC | PRN
  Administered 2014-10-01: 1000 mL

## 2014-10-01 MED ORDER — MIDAZOLAM HCL 5 MG/5ML IJ SOLN
INTRAMUSCULAR | Status: DC | PRN
  Administered 2014-10-01 (×2): 1 mg via INTRAVENOUS

## 2014-10-01 MED ORDER — FENTANYL CITRATE (PF) 250 MCG/5ML IJ SOLN
INTRAMUSCULAR | Status: AC
  Filled 2014-10-01: qty 5

## 2014-10-01 MED ORDER — DEXMEDETOMIDINE HCL IN NACL 200 MCG/50ML IV SOLN
INTRAVENOUS | Status: AC
  Filled 2014-10-01: qty 50

## 2014-10-01 MED ORDER — MIDAZOLAM HCL 2 MG/2ML IJ SOLN
INTRAMUSCULAR | Status: AC
  Filled 2014-10-01: qty 2

## 2014-10-01 MED ORDER — HEPARIN SODIUM (PORCINE) 1000 UNIT/ML DIALYSIS: 1000.0000 [IU] | INTRAMUSCULAR | Status: DC | PRN

## 2014-10-01 MED ORDER — BOOST / RESOURCE BREEZE PO LIQD
1.0000 | ORAL | Status: DC
  Administered 2014-10-02 – 2014-10-10 (×6): 1 via ORAL

## 2014-10-01 MED ORDER — ALTEPLASE 2 MG IJ SOLR
2.0000 mg | Freq: Once | INTRAMUSCULAR | Status: DC | PRN
  Filled 2014-10-01: qty 2

## 2014-10-01 MED ORDER — DEXMEDETOMIDINE HCL IN NACL 200 MCG/50ML IV SOLN
INTRAVENOUS | Status: DC | PRN
  Administered 2014-10-01: 0.4 ug/kg/h via INTRAVENOUS

## 2014-10-01 MED ORDER — SODIUM CHLORIDE 0.9 % IV SOLN: 100.0000 mL | INTRAVENOUS | Status: DC | PRN

## 2014-10-01 MED ORDER — LIDOCAINE HCL (PF) 1 % IJ SOLN
INTRAMUSCULAR | Status: AC
  Filled 2014-10-01: qty 60

## 2014-10-01 MED ORDER — THROMBIN 20000 UNITS EX SOLR
CUTANEOUS | Status: AC
  Filled 2014-10-01: qty 20000

## 2014-10-01 MED ORDER — LIDOCAINE HCL (PF) 1 % IJ SOLN
INTRAMUSCULAR | Status: DC | PRN
  Administered 2014-10-01: 30 mL

## 2014-10-01 MED ORDER — HEPARIN SODIUM (PORCINE) 1000 UNIT/ML IJ SOLN
INTRAMUSCULAR | Status: DC | PRN
  Administered 2014-10-01: 5000 [IU] via INTRAVENOUS

## 2014-10-01 MED ORDER — LIDOCAINE HCL (CARDIAC) 20 MG/ML IV SOLN
INTRAVENOUS | Status: AC
  Filled 2014-10-01: qty 5

## 2014-10-01 MED ORDER — MIDODRINE HCL 5 MG PO TABS
10.0000 mg | ORAL_TABLET | Freq: Three times a day (TID) | ORAL | Status: DC
  Administered 2014-10-01 – 2014-10-07 (×17): 10 mg via ORAL
  Filled 2014-10-01 (×18): qty 2

## 2014-10-01 MED ORDER — SODIUM CHLORIDE 0.9 % IR SOLN
Status: DC | PRN
  Administered 2014-10-01: 500 mL

## 2014-10-01 MED ORDER — PROTAMINE SULFATE 10 MG/ML IV SOLN
INTRAVENOUS | Status: AC
  Filled 2014-10-01: qty 5

## 2014-10-01 MED ORDER — DEXMEDETOMIDINE HCL 200 MCG/2ML IV SOLN
INTRAVENOUS | Status: DC | PRN
  Administered 2014-10-01: 30 ug via INTRAVENOUS

## 2014-10-01 MED ORDER — FENTANYL CITRATE (PF) 100 MCG/2ML IJ SOLN: 25.0000 ug | INTRAMUSCULAR | Status: DC | PRN

## 2014-10-01 MED ORDER — NEPRO/CARBSTEADY PO LIQD: 237.0000 mL | ORAL | Status: DC | PRN

## 2014-10-01 MED ORDER — HEPARIN SODIUM (PORCINE) 1000 UNIT/ML DIALYSIS: 20.0000 [IU]/kg | INTRAMUSCULAR | Status: DC | PRN

## 2014-10-01 MED ORDER — LIDOCAINE-PRILOCAINE 2.5-2.5 % EX CREA
1.0000 "application " | TOPICAL_CREAM | CUTANEOUS | Status: DC | PRN
  Filled 2014-10-01: qty 5

## 2014-10-01 MED ORDER — LIDOCAINE HCL (PF) 1 % IJ SOLN: 5.0000 mL | INTRAMUSCULAR | Status: DC | PRN

## 2014-10-01 MED ORDER — NEPRO/CARBSTEADY PO LIQD
237.0000 mL | ORAL | Status: DC
  Filled 2014-10-01: qty 237

## 2014-10-01 SURGICAL SUPPLY — 31 items
ARMBAND PINK RESTRICT EXTREMIT (MISCELLANEOUS) ×2 IMPLANT
CANISTER SUCTION 2500CC (MISCELLANEOUS) ×2 IMPLANT
CANNULA VESSEL 3MM 2 BLNT TIP (CANNULA) ×2 IMPLANT
CLIP TI MEDIUM 6 (CLIP) ×2 IMPLANT
CLIP TI WIDE RED SMALL 6 (CLIP) ×2 IMPLANT
COVER PROBE W GEL 5X96 (DRAPES) IMPLANT
DECANTER SPIKE VIAL GLASS SM (MISCELLANEOUS) ×2 IMPLANT
DRAIN PENROSE 1/4X12 LTX STRL (WOUND CARE) ×2 IMPLANT
ELECT REM PT RETURN 9FT ADLT (ELECTROSURGICAL) ×2
ELECTRODE REM PT RTRN 9FT ADLT (ELECTROSURGICAL) ×1 IMPLANT
GLOVE BIO SURGEON STRL SZ7.5 (GLOVE) ×2 IMPLANT
GLOVE SKINSENSE NS SZ7.0 (GLOVE) ×3
GLOVE SKINSENSE STRL SZ7.0 (GLOVE) ×3 IMPLANT
GOWN DECONTAM LG NS DISP (GOWN DISPOSABLE) ×2 IMPLANT
GOWN STRL REUS W/ TWL LRG LVL3 (GOWN DISPOSABLE) ×3 IMPLANT
GOWN STRL REUS W/TWL LRG LVL3 (GOWN DISPOSABLE) ×3
KIT BASIN OR (CUSTOM PROCEDURE TRAY) ×2 IMPLANT
KIT ROOM TURNOVER OR (KITS) ×2 IMPLANT
LIQUID BAND (GAUZE/BANDAGES/DRESSINGS) ×2 IMPLANT
LOOP VESSEL MINI RED (MISCELLANEOUS) IMPLANT
NS IRRIG 1000ML POUR BTL (IV SOLUTION) ×2 IMPLANT
PACK CV ACCESS (CUSTOM PROCEDURE TRAY) ×2 IMPLANT
PAD ARMBOARD 7.5X6 YLW CONV (MISCELLANEOUS) ×4 IMPLANT
SPONGE SURGIFOAM ABS GEL 100 (HEMOSTASIS) IMPLANT
SUT PROLENE 6 0 BV (SUTURE) ×2 IMPLANT
SUT PROLENE 7 0 BV 1 (SUTURE) ×4 IMPLANT
SUT VIC AB 3-0 SH 27 (SUTURE) ×1
SUT VIC AB 3-0 SH 27X BRD (SUTURE) ×1 IMPLANT
SUT VICRYL 4-0 PS2 18IN ABS (SUTURE) ×2 IMPLANT
UNDERPAD 30X30 INCONTINENT (UNDERPADS AND DIAPERS) ×2 IMPLANT
WATER STERILE IRR 1000ML POUR (IV SOLUTION) ×2 IMPLANT

## 2014-10-01 NOTE — Progress Notes (Signed)
The patient was concerned that he did not have his ICD turned back on after his fistula placement today.  The cardiologist on call was notified.  Report on the issue was also given to the oncoming nurse.

## 2014-10-01 NOTE — Progress Notes (Signed)
Pt with Left femur fracture with dsg. X2 D&I.  Dr. Mardelle Matte informed about stitches removal.  Instructed that he will  f/u tomorrow, will be in to see pt.  Pt's wife and primary nurse made aware.  Karie Kirks, Therapist, sports.

## 2014-10-01 NOTE — Anesthesia Postprocedure Evaluation (Signed)
  Anesthesia Post-op Note  Patient: Larry [name]  Procedure(s) Performed: Procedure(s): Creation of  Right Arm  ARTERIOVENOUS Fistula (Right)  Patient Location: PACU  Anesthesia Type: MAC  Level of Consciousness: awake and alert   Airway and Oxygen Therapy: Patient Spontanous Breathing  Post-op Pain: Controlled  Post-op Assessment: Post-op Vital signs reviewed, Patient's Cardiovascular Status Stable and Respiratory Function Stable  Post-op Vital Signs: Reviewed  Filed Vitals:   10/01/14 1537  BP: 102/58  Pulse: 58  Temp:   Resp: 15    Complications: No apparent anesthesia complications

## 2014-10-01 NOTE — Anesthesia Preprocedure Evaluation (Addendum)
Anesthesia Evaluation  Patient identified by MRN, date of birth, ID band Patient awake    Reviewed: Allergy & Precautions, H&P , NPO status , Patient's Chart, lab work & pertinent test results  Airway Mallampati: II  TM Distance: >3 FB Neck ROM: Full    Dental no notable dental hx. (+) Edentulous Upper, Edentulous Lower, Dental Advisory Given   Pulmonary shortness of breath, sleep apnea, Continuous Positive Airway Pressure Ventilation and Oxygen sleep apnea , COPD oxygen dependent, former smoker,  breath sounds clear to auscultation  Pulmonary exam normal       Cardiovascular hypertension, Pt. on medications and Pt. on home beta blockers + CAD, + Peripheral Vascular Disease and +CHF + dysrhythmias Atrial Fibrillation + Cardiac Defibrillator Rhythm:Regular Rate:Normal     Neuro/Psych Anxiety Depression Pituitary tumor CVA, No Residual Symptoms    GI/Hepatic negative GI ROS, Neg liver ROS,   Endo/Other  diabetes, Insulin DependentHypothyroidism   Renal/GU ESRF and DialysisRenal disease  negative genitourinary   Musculoskeletal  (+) Arthritis -,   Abdominal   Peds  Hematology negative hematology ROS (+)   Anesthesia Other Findings   Reproductive/Obstetrics negative OB ROS                          Anesthesia Physical Anesthesia Plan  ASA: IV  Anesthesia Plan: MAC   Post-op Pain Management:    Induction: Intravenous  Airway Management Planned: Simple Face Mask  Additional Equipment:   Intra-op Plan:   Post-operative Plan:   Informed Consent: I have reviewed the patients History and Physical, chart, labs and discussed the procedure including the risks, benefits and alternatives for the proposed anesthesia with the patient or authorized representative who has indicated his/her understanding and acceptance.   Dental advisory given  Plan Discussed with: CRNA  Anesthesia Plan Comments:          Anesthesia Quick Evaluation

## 2014-10-01 NOTE — Progress Notes (Addendum)
S: denies any sob or cp currently, tolerate HD better yesterday with less anxiety.   O:BP 106/61 mmHg  Pulse 66  Temp(Src) 98.4 F (36.9 C) (Oral)  Resp 18  Ht 5\' 10"  (1.778 m)  Wt 220 lb 14.4 oz (100.2 kg)  BMI 31.70 kg/m2  SpO2 95%  Intake/Output Summary (Last 24 hours) at 10/01/14 0741 Last data filed at 10/01/14 0730  Gross per 24 hour  Intake    300 ml  Output   3120 ml  Net  -2820 ml   Weight change: -1 lb 10.1 oz (-0.741 kg) Gen: Awake and alert, overall looks less edematous. CVS: RRR Resp: CTAB.  Abd: + BS, NDNT. Ecchymoses on right flank.   Ext: 1+ pitting edmea b/l LE's, improving slowly. Legs wrapped with dressing.  NEURO: CNI Ox3 no asterixis Rt IJ permcath Rt Arm PICC Left arm has some wounds.    [name] amiodarone  200 mg Oral Daily  . atorvastatin  40 mg Oral q1800  . bisacodyl  5 mg Oral QHS  . cefUROXime (ZINACEF)  IV  1.5 g Intravenous To SS-Surg  . darbepoetin (ARANESP) injection - DIALYSIS  100 mcg Intravenous Q Mon-HD  . DULoxetine  30 mg Oral Daily  . feeding supplement (NEPRO CARB STEADY)  237 mL Oral BID BM  . insulin aspart  0-9 Units Subcutaneous TID AC & HS  . levothyroxine  75 mcg Oral QAC breakfast  . midodrine  10 mg Oral TID WC  . multivitamin  1 tablet Oral QHS  . sildenafil  80 mg Oral TID   No results found. BMET    Component Value Date/Time   NA 130* 09/30/2014 0318   K 3.8 09/30/2014 0318   CL 95* 09/30/2014 0318   CO2 25 09/30/2014 0318   GLUCOSE 115* 09/30/2014 0318   BUN 34* 09/30/2014 0318   CREATININE 3.79* 09/30/2014 0318   CALCIUM 8.1* 09/30/2014 0318   CALCIUM 7.9* 09/20/2014 1315   GFRNONAA 16* 09/30/2014 0318   GFRAA 18* 09/30/2014 0318   CBC    Component Value Date/Time   WBC 6.2 09/28/2014 1827   RBC 3.45* 09/28/2014 1827   HGB 10.9* 09/28/2014 1827   HCT 33.6* 09/28/2014 1827   PLT 112* 09/28/2014 1827   MCV 97.4 09/28/2014 1827   MCH 31.6 09/28/2014 1827   MCHC 32.4 09/28/2014 1827   RDW 20.4*  09/28/2014 1827   LYMPHSABS 1.0 09/27/2014 0812   MONOABS 0.7 09/27/2014 0812   EOSABS 0.3 09/27/2014 0812   BASOSABS 0.1 09/27/2014 2774     Assessment:   64 yo M with hxo f DM II, PAF, hypothyroidism, CAD, 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  1. New ESRD -likely 2/2 to blood loss and ATN.  remains volume overloaded still but weight improving with daily HD.  awaiting permanent HD access. Vein mapping done and VVS consulted, awaiting right sided AVF placement. Has multiple sessions of HD here.  2. Severe cardiomyopathy s/p ICD. Not candidate for LVAD. CHF team on board.  3. Abd wall hematoma, improving - holding xarelto. hgb stable.  4. SP Lt hip fx 5. Anemia on aranesp, received IV iron 6. Sec HPTH not requiring Vit D  PTH 90 on 8/8 7. Bullae on legs - 2/2 to volume overload -improving, wound care following.  8. Anxiety -especially at HD sessions - on xanax currently. Follows with outpatient psych.  Plan:  1.  Were able to remove -  3000 yesterday. HD again today for further optimize his volume status before discharge. 2. F/up with VVS about AVF placement.  3. Continue midodrine to support his BP and allow good volume removal.  4. Cont to work with PT. Rehab pending.  5. Outpatient HD being arranged.   Larry [name], Larry [name]   I have seen and examined this patient and agree with plan as outlined by Dr. Genene Churn.  Continue with HD 4 days [name] week due to volume.  Plan for HD MTTHSat however due to inpatient census and AVF placement today, will plan for HD MWFSat this week.  Continue to challenge EDW as BP tolerates. Larry Stalnaker [name],Larry [name] 10/01/2014 1:04 PM

## 2014-10-01 NOTE — Transfer of Care (Signed)
Immediate Anesthesia Transfer of Care Note  Patient: Larry [name]  Procedure(s) Performed: Procedure(s): Creation of  Right Arm  ARTERIOVENOUS Fistula (Right)  Patient Location: PACU  Anesthesia Type:MAC  Level of Consciousness: awake, alert , oriented and patient cooperative  Airway & Oxygen Therapy: Patient Spontanous Breathing and Patient connected to nasal cannula oxygen  Post-op Assessment: Report given to RN and Post -op Vital signs reviewed and stable  Post vital signs: Reviewed  Last Vitals:  Filed Vitals:   10/01/14 1507  BP:   Pulse:   Temp: 36.4 C  Resp:     Complications: No apparent anesthesia complications

## 2014-10-01 NOTE — Clinical Social Work Note (Signed)
CSW left a voice message for the pt's wife Peter Congo to follow up on SNF choice and to provided support and answer addition questions about LTACH. CSW will await for a call back.   Francis, MSW, [place]

## 2014-10-01 NOTE — Progress Notes (Signed)
Occupational Therapy Treatment Patient Details Name: GUY SEESE MRN: [id number] DOB: 31-Mar-1950 Today's Date: 10/01/2014    History of present illness Larry [name] is a 64 y.o. male who complains of left hip pain after sustaining a mechanical fall at home. Pt with left intertrochanteric fx s/p IM nail. PMHx NICM, CAD, DDD, back pain, CVA, obesity. 7/23 CRRT initiated, 7/31 CRRT discontinued.   OT comments  Pt sleepy, but agreeable to UB exercise with level 2 theraband and standing activities as precursor to ADL.  Instructed pt to discontinue resistive exercises post HD graft placement until MD approves resumption. Pt and wife verbalized understanding.  Follow Up Recommendations  SNF    Equipment Recommendations  3 in 1 bedside comode;Hospital bed    Recommendations for Other Services      Precautions / Restrictions Precautions Precautions: Fall Restrictions LLE Weight Bearing: Weight bearing as tolerated       Mobility Bed Mobility               General bed mobility comments: pt in chair  Transfers Overall transfer level: Needs assistance Equipment used: Rolling walker (2 wheeled) Transfers: Sit to/from Stand Sit to Stand: Min assist              Balance     Sitting balance-Leahy Scale: Good       Standing balance-Leahy Scale: Fair Standing balance comment: stood at chair briefly for positional change                   ADL                                         General ADL Comments: Pt sleepy due to being NPO and receiving pain meds. Pt for placement of HD graft in arm.      Vision                     Perception     Praxis      Cognition   Behavior During Therapy: [facility] for tasks assessed/performed Overall Cognitive Status: Within Functional Limits for tasks assessed                       Extremity/Trunk Assessment               Exercises General Exercises - Upper Extremity Shoulder  Flexion: Strengthening;Both;10 reps;Seated;Theraband Theraband Level (Shoulder Flexion): Level 2 (Red) Shoulder Horizontal ABduction: Strengthening;Both;10 reps;Seated   Shoulder Instructions       General Comments      Pertinent Vitals/ Pain       Pain Assessment: Faces Faces Pain Scale: Hurts a little bit Pain Location: feet with weight bearing due to wounds Pain Descriptors / Indicators: Sharp Pain Intervention(s): Limited activity within patient's tolerance;Monitored during session;Premedicated before session;Repositioned  Home Living                                          Prior Functioning/Environment              Frequency Min 2X/week     Progress Toward Goals  OT Goals(current goals can now be found in the care plan section)  Progress towards OT goals: Progressing toward goals  Acute Rehab OT Goals  Patient Stated Goal: return home able to care for himself Time For Goal Achievement: 10/10/14 Potential to Achieve Goals: Good  Plan Discharge plan remains appropriate    Co-evaluation                 End of Session     Activity Tolerance Patient limited by fatigue   Patient Left in chair;with call bell/phone within reach;with family/visitor present   Nurse Communication          Time: 2449-7530 OT Time Calculation (min): 18 min  Charges: OT General Charges $OT Visit: 1 Procedure OT Treatments $Therapeutic Exercise: 8-22 mins  [name] 10/01/2014, 10:22 AM  [phone]

## 2014-10-01 NOTE — Progress Notes (Signed)
Patient ID: Larry [name], male   DOB: Jul 06, 1950, 64 y.o.   MRN: [id number]   Subjective:    Mr. Martino is a 64 y/o man with history of chronic systolic CHF (nonischemic cardiomyopathy) s/p St. 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 He is on chronic milrinone 0.5 mcg.  Prior to admit he was being considered for LVAD.   Discharged 7/16 after dental procedures. Also had RHC with elevate pulmonary pressures. Revatio was increased to 80 mg tid during that stay. Admitted with fall---> broken L  hip. 7/18 had IM Nail L femur.  7/23 started CVVHD, CVVH stopped 7/31.  8/6 began having severe right ab/flank pain and was found to have large retrroperitoneal hematoma 8/7 with transfer to ICU. 8/18 VVS consulted . Plan for AVF next week.   .  Off anticoagulants using SCDs for fistula by VVS today.  Denies SOB/Orthopnea.     Objective:   Weight Range:  Vital Signs:   Temp:  [97 F (36.1 C)-98.6 F (37 C)] 98.4 F (36.9 C) (08/23 0517) Pulse Rate:  [62-71] 66 (08/23 0517) Resp:  [11-18] 18 (08/23 0517) BP: (95-117)/(50-65) 106/61 mmHg (08/23 0517) SpO2:  [95 %-98 %] 95 % (08/23 0517) Weight:  [217 lb 2.5 oz (98.5 kg)-223 lb 12.3 oz (101.5 kg)] 220 lb 14.4 oz (100.2 kg) (08/23 0517) Last BM Date: 09/29/14  Weight change: Filed Weights   09/30/14 0906 09/30/14 1306 10/01/14 0517  Weight: 223 lb 12.3 oz (101.5 kg) 217 lb 2.5 oz (98.5 kg) 220 lb 14.4 oz (100.2 kg)    Intake/Output:   Intake/Output Summary (Last 24 hours) at 10/01/14 0755 Last data filed at 10/01/14 0730  Gross per 24 hour  Intake    300 ml  Output   3120 ml  Net  -2820 ml     Physical Exam: General:  Sitting in chair. NAD. Wife present.  HEENT: normal Neck: supple. JVP 8 cm. Tunneled HD catheter.  Carotids 2+ bilat; no bruits. No lymphadenopathy or thryomegaly appreciated. Cor: PMI nondisplaced. Regular rate & rhythm. No rubs,  gallops or murmurs.  AICD under left clavicle  Lungs: clear Abdomen: soft, nondistended. Large hematoma over right flank. + bowel sounds. Extremities: no cyanosis, clubbing, rash. R and LLE unna boots in place. 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  Liver Function Tests:  Recent Labs Lab 09/25/14 0811 09/27/14 0827 09/30/14 0318  ALBUMIN 2.8* 2.5* 2.7*    CBC:  Recent Labs Lab 09/25/14 0500 09/25/14 0811 09/27/14 0812 09/28/14 1827  WBC 6.5 6.6 5.2 6.2  NEUTROABS  --   --  3.2  --   HGB 9.7* 10.3* 9.7* 10.9*  HCT 31.1* 33.1* 30.9* 33.6*  MCV 97.8 99.1 99.7 97.4  PLT 218 236 214 112*    Cardiac Enzymes: No results for input(s): CKTOTAL, CKMB, CKMBINDEX, TROPONINI in the last 168 hours.  BNP: BNP (last 3 results)  Recent Labs  05/22/14 1029 05/27/14 1126 06/19/14 1030  BNP 935.2* 1627.8* 738.9*    ProBNP (last 3 results)  Recent Labs  10/09/13 0926 11/14/13 0926 01/14/14 1112  PROBNP 1561.0* 1261.0* 5769.0*      Other results:   Imaging: No results found.   Medications:     Scheduled Medications: . amiodarone  200 mg Oral Daily  . atorvastatin  40 mg Oral q1800  . bisacodyl  5 mg Oral QHS  . cefUROXime (ZINACEF)  IV  1.5 g Intravenous To SS-Surg  . darbepoetin (ARANESP) injection - DIALYSIS  100 mcg Intravenous Q Mon-HD  . DULoxetine  30 mg Oral Daily  .  feeding supplement (NEPRO CARB STEADY)  237 mL Oral BID BM  . insulin aspart  0-9 Units Subcutaneous TID AC & HS  . levothyroxine  75 mcg Oral QAC breakfast  . midodrine  10 mg Oral TID WC  . multivitamin  1 tablet Oral QHS  . sildenafil  80 mg Oral TID    Infusions: . sodium chloride Stopped (09/15/14 1600)  . sodium chloride irrigation      PRN Medications: albuterol, ALPRAZolam, bisacodyl, fentaNYL (SUBLIMAZE) injection, ondansetron (ZOFRAN) IV, oxyCODONE-acetaminophen **AND** oxyCODONE, polyethylene glycol, sodium chloride, temazepam   Assessment:   1. Left Hip Fracture s/p IM nail on 08/26/14 2. Chronic systolic CHF- Nonischemic cardiomyopathy, on home milrinone 0.5 mcg/kg/min, decreased to 0.25 here with NSVT. Became hypotensive, transitioned to dobutamine 2.5 => now off dobutamine as well.  3. Acute on chronic renal failure - suspect post-op ATN.  CVVH => HD.  4.  Paroxysmal atrial fibrillation requiring DCCV 02/2014, 04/2014- on chronic amio and xarelto prior to admission. CHADS VASc Score- 5.  Remains in NSR.  5. Diabetes mellitus type 2 6. Depression/anxiety 7. OSA, complex- Needs Bipap at night but had difficulty after dental procedure.  8 Pulmonary HTN - Mixed pulmonary venous hypertension and PAH- on Revatio 80 mg tid 9. Dental extractions 08/2014 by Dr Enrique Sack 10. Obesity 11. Probable chronic respiratory failure - requiring home O2 with ambulation as of this admission 12. Hyponatremia   13. NSVT 14. Delirium- Improved.   15. Hemorrhagic shock with massive retroperitoneal hematoma 8/6 => off anticoagulation.  16.Stage II Pressure Ulcers- Heel Pressure ulcer  Plan/Discussion:    Tolerating HD. No longer on intotropes.  Remains volume overloaded on exam and continues to need fluid off via dialysis. Planning for HD today.  Per VVS plan for AVF today.       Last hgb stable. Continue SCDs instead of low dose Lovenox.  Will need to leave off anticoagulation for the time  being despite CVA risk from atrial fibrillation.  Now that he is off inotropes, amio cut back to once daily. Remains in NSR.   Appreciate wound care consult for Bullae on feet.  Continue Unna Boots R and LLE .     Disposition: SW following- to SNF or LTACH soon    CLEGG,AMY, NP-C  7:55 AM

## 2014-10-01 NOTE — H&P (View-Only) (Signed)
    Subjective  -   No complaints this am A little anxious about dialysis and surgery   Physical Exam:  Palpable right radial pulse  PICC line on right removed Non-labored breathing     Assessment/Plan:   I again discussed the details of the procedure with the patient and his wife.  He will need a right arm fistula, possible a radio-cephalic but likely a bracheo-cephalic, depending on what the vein looks like on ultrasound in the OR.  He is right handed but has a ICD on the left.  We discussed the risks and benefits, including the possible need for future revisions and fistula failure.  All of his questions have been answered.  This will likely be done without the use of heparin, given his recent RP bleed.  Annamarie Major 09/30/2014 8:26 PM --  Filed Vitals:   09/30/14 1436  BP:   Pulse: 71  Temp: 98.6 F (37 C)  Resp: 16    Intake/Output Summary (Last 24 hours) at 09/30/14 2026 Last data filed at 09/30/14 1700  Gross per 24 hour  Intake    180 ml  Output   3000 ml  Net  -2820 ml     Laboratory CBC    Component Value Date/Time   WBC 6.2 09/28/2014 1827   HGB 10.9* 09/28/2014 1827   HCT 33.6* 09/28/2014 1827   PLT 112* 09/28/2014 1827    BMET    Component Value Date/Time   NA 130* 09/30/2014 0318   K 3.8 09/30/2014 0318   CL 95* 09/30/2014 0318   CO2 25 09/30/2014 0318   GLUCOSE 115* 09/30/2014 0318   BUN 34* 09/30/2014 0318   CREATININE 3.79* 09/30/2014 0318   CALCIUM 8.1* 09/30/2014 0318   CALCIUM 7.9* 09/20/2014 1315   GFRNONAA 16* 09/30/2014 0318   GFRAA 18* 09/30/2014 0318    COAG Lab Results  Component Value Date   INR 1.18 09/19/2014   INR 1.28 09/18/2014   INR 1.29 09/17/2014   No results found for: PTT  Antibiotics Anti-infectives    Start     Dose/Rate Route Frequency Ordered Stop   09/13/14 0855  ceFAZolin (ANCEF) 2-3 GM-% IVPB SOLR    Comments:  Shelton, Whitney   : cabinet override      09/13/14 0855 09/13/14 2114   09/10/14 1500  cefTRIAXone (ROCEPHIN) 1 g in dextrose 5 % 50 mL IVPB  Status:  Discontinued     1 g 100 mL/hr over 30 Minutes Intravenous Every 24 hours 09/10/14 1418 09/17/14 0921   08/26/14 2100  ceFAZolin (ANCEF) IVPB 2 g/50 mL premix     2 g 100 mL/hr over 30 Minutes Intravenous Every 6 hours 08/26/14 1851 08/27/14 0632   08/26/14 0600  ceFAZolin (ANCEF) IVPB 2 g/50 mL premix     2 g 100 mL/hr over 30 Minutes Intravenous To Surgery 08/25/14 2007 08/26/14 1545       V. Leia Alf, M.D. [facility] Office: [phone] Pager:  [phone]

## 2014-10-01 NOTE — Anesthesia Procedure Notes (Signed)
Procedure Name: MAC Date/Time: 10/01/2014 1:45 PM Performed by: Jenne Campus Pre-anesthesia Checklist: Patient identified, Emergency Drugs available, Suction available, Patient being monitored and Timeout performed Patient Re-evaluated:Patient Re-evaluated prior to inductionOxygen Delivery Method: Simple face mask

## 2014-10-01 NOTE — Interval H&P Note (Signed)
History and Physical Interval Note:  10/01/2014 9:40 AM  Larry [name]  has presented today for surgery, with the diagnosis of End Stage Renal Disease N18.6  The various methods of treatment have been discussed with the patient and family. After consideration of risks, benefits and other options for treatment, the patient has consented to  Procedure(s): ARTERIOVENOUS (AV) FISTULA CREATION (Right) as a surgical intervention .  The patient's history has been reviewed, patient examined, no change in status, stable for surgery.  I have reviewed the patient's chart and labs.  Questions were answered to the patient's satisfaction.     Ruta Hinds

## 2014-10-01 NOTE — Progress Notes (Signed)
Nutrition Follow-up  DOCUMENTATION CODES:   Obesity unspecified  INTERVENTION:  Provide Nepro Shake po once daily, each supplement provides 425 kcal and 19 grams protein Provide Boost Breeze po once daily, each supplement provides 250 kcal and 9 grams of protein Continue Renal Multivitamin   NUTRITION DIAGNOSIS:   Increased nutrient needs related to wound healing as evidenced by estimated needs.  ongoing  GOAL:   Patient will meet greater than or equal to 90% of their needs  Being met  MONITOR:   PO intake, Labs, Weight trends, I & O's, Skin  REASON FOR ASSESSMENT:   Consult Assessment of nutrition requirement/status  ASSESSMENT:   64 y.o. Male who complains of left hip pain after sustaining a mechanical fall at home this morning. He presented to the ER for evaluation after being unable to ambulate without pain. Prior to the fall, the patient was abulatory without any assistive devices.   Pt NPO today; nauseous at time of visit after taking pain medication on an empty stomach. He reports eating well most days with nausea at times that resolves with Zofran. Per nursing notes pt ate 45-75% of meals yesterday but, the past several days he has been eating 100% of all meals. Pt's weight has dropped 15 lbs in the past week. Per MD note, remains volume overloaded.  Pt states he is getting tired of Nepro Shakes. RD discussed the importance of getting adequate protein and vitamins/minerals; pt agreeable to decreasing to once daily. Will try Boost Breeze.   Labs: low sodium, low chloride, low calcium, high phosphorus  Diet Order:  Diet NPO time specified Except for: Sips with Meds  Skin:  Wound (see comment) (stage II lt heel, stage I rt heel, DTI buttocks)  Last BM:  8/21  Height:   Ht Readings from Last 1 Encounters:  09/22/14 $RemoveB'5\' 10"'TqqUDnqB$  (1.778 m)    Weight:   Wt Readings from Last 1 Encounters:  10/01/14 220 lb 14.4 oz (100.2 kg)    Ideal Body Weight:  75.4  kg  BMI:  Body mass index is 31.7 kg/(m^2).  Estimated Nutritional Needs:   Kcal:  2000-2200  Protein:  105-115 gm  Fluid:  2.0-2.2 L  EDUCATION NEEDS:   Education needs addressed  Scarlette Ar RD, LDN Inpatient Clinical Dietitian Pager: [phone] After Hours Pager: [phone]

## 2014-10-01 NOTE — Op Note (Signed)
Procedure: Right Radial Cephalic AV fistula   Preop: ESRD   Postop: ESRD   Anesthesia: MAC with local   Assistant:  Carlyn Reichert RNfA  Findings: 2.5 mm cephalic vein       2.0 mm radial artery   Procedure Details:  The right upper extremity was prepped and draped in usual sterile fashion. Local anesthesia was infiltrated midway between the cephalic and radial artery anatomically. A longitudinal skin incision was then made in this location at the distal left forearm. The incision was carried into the subcutaneous tissues down to level cephalic vein. The vein had some spasm. The vein was dissected free circumferentially and small side branches ligated and divided between silk ties. The distal end was ligated and the vein transected. This was gently distended with heparinized saline, spatulated, and marked for orientation. The vein dilated up to about 2.5 mm.  Next the radial artery was dissected free in the medial portion incision. The artery was 2 mm in diameter but had a reasonable pulse. The vessel loops were placed proximal and distal to the planned site of arteriotomy. The patient was given 5000 units of intravenous heparin. After appropriate circulation time, the vessel loops were used to control the artery. A longitudinal opening was made in the right radial artery. The vein was controlled proximally with a fine bulldog clamp. The vein was then swung over to the artery and sewn end of vein to side of artery using a running 7-0 Prolene suture. Just prior to completion of the anastomosis, it was fore bled back bled and thoroughly flushed. The anastomosis was secured, vessel loops released, and there was a slightly pulsatile quality in the proximal fistula but overall reasonable doppler flow. After hemostasis was obtained, the subcutaneous tissues were reapproximated using a running 3-0 Vicryl suture. The skin was then closed with a 4 Vicryl subcuticular stitch. Dermabond was applied to the skin  incision. The patient tolerated the procedure well and there were no complications. Instrument sponge and needle count were correct at the end of the case. The patient was taken to PACU in stable condition.   Ruta Hinds, MD  [facility]  Office: [phone]  Pager: [phone]

## 2014-10-01 NOTE — Progress Notes (Signed)
Receiving report from Audrea Muscat RN at this time

## 2014-10-02 ENCOUNTER — Other Ambulatory Visit: Payer: Self-pay | Admitting: *Deleted

## 2014-10-02 ENCOUNTER — Encounter (HOSPITAL_COMMUNITY): Payer: Self-pay | Admitting: Vascular Surgery

## 2014-10-02 DIAGNOSIS — N186 End stage renal disease: Secondary | ICD-10-CM

## 2014-10-02 DIAGNOSIS — Z4931 Encounter for adequacy testing for hemodialysis: Secondary | ICD-10-CM

## 2014-10-02 LAB — RENAL FUNCTION PANEL
Albumin: 2.7 g/dL — ABNORMAL LOW (ref 3.5–5.0)
Anion gap: 9 (ref 5–15)
BUN: 33 mg/dL — AB (ref 6–20)
CHLORIDE: 96 mmol/L — AB (ref 101–111)
CO2: 25 mmol/L (ref 22–32)
CREATININE: 3.86 mg/dL — AB (ref 0.61–1.24)
Calcium: 8.3 mg/dL — ABNORMAL LOW (ref 8.9–10.3)
GFR calc Af Amer: 18 mL/min — ABNORMAL LOW (ref >60)
GFR calc non Af Amer: 15 mL/min — ABNORMAL LOW (ref >60)
GLUCOSE: 101 mg/dL — AB (ref 65–99)
POTASSIUM: 4.3 mmol/L (ref 3.5–5.1)
Phosphorus: 5.4 mg/dL — ABNORMAL HIGH (ref 2.5–4.6)
Sodium: 130 mmol/L — ABNORMAL LOW (ref 135–145)

## 2014-10-02 LAB — GLUCOSE, CAPILLARY
GLUCOSE-CAPILLARY: 100 mg/dL — AB (ref 65–99)
GLUCOSE-CAPILLARY: 119 mg/dL — AB (ref 65–99)
GLUCOSE-CAPILLARY: 114 mg/dL — AB (ref 65–99)
Glucose-Capillary: 118 mg/dL — ABNORMAL HIGH (ref 65–99)

## 2014-10-02 LAB — CBC WITH DIFFERENTIAL/PLATELET
BASOS ABS: 0.1 10*3/uL (ref 0.0–0.1)
BASOS PCT: 1 % (ref 0–1)
Eosinophils Absolute: 0.3 10*3/uL (ref 0.0–0.7)
Eosinophils Relative: 6 % — ABNORMAL HIGH (ref 0–5)
HEMATOCRIT: 31.5 % — AB (ref 39.0–52.0)
HEMOGLOBIN: 9.9 g/dL — AB (ref 13.0–17.0)
LYMPHS PCT: 20 % (ref 12–46)
Lymphs Abs: 1 10*3/uL (ref 0.7–4.0)
MCH: 31.1 pg (ref 26.0–34.0)
MCHC: 31.4 g/dL (ref 30.0–36.0)
MCV: 99.1 fL (ref 78.0–100.0)
Monocytes Absolute: 0.8 10*3/uL (ref 0.1–1.0)
Monocytes Relative: 15 % — ABNORMAL HIGH (ref 3–12)
NEUTROS ABS: 2.9 10*3/uL (ref 1.7–7.7)
NEUTROS PCT: 58 % (ref 43–77)
Platelets: 178 10*3/uL (ref 150–400)
RBC: 3.18 MIL/uL — AB (ref 4.22–5.81)
RDW: 20.3 % — AB (ref 11.5–15.5)
WBC: 5.1 10*3/uL (ref 4.0–10.5)

## 2014-10-02 LAB — MAGNESIUM: MAGNESIUM: 2.1 mg/dL (ref 1.7–2.4)

## 2014-10-02 MED ORDER — MIDODRINE HCL 5 MG PO TABS
ORAL_TABLET | ORAL | Status: AC
  Filled 2014-10-02: qty 2

## 2014-10-02 NOTE — Progress Notes (Signed)
Telemetry notified that pt had 6 beat run of vtach. Aymptomatic, charge notified and MD notified. Call into [facility] - order received for magnesium level. Margarito Liner

## 2014-10-02 NOTE — Progress Notes (Signed)
Spoke with Corene Cornea from [facility] whom stated he is unaware of anyone turning off the patient's ICD stating, " the surgical team probably used a magnet during the surgery."  He stated he will be in the hospital this afternoon, and will come to the patient's bedside to verify the device is turned on. He is aware the patient is currently in HD.   [place] Number: [phone]

## 2014-10-02 NOTE — Progress Notes (Signed)
Patient have 23 beats of questionable V. Tach/ Wide QRS, patient is asymptomatic, V/S  98.1, H.67, BP. 90/50, R. 20. Potassium and magnesium is WNL. PA on call notified. No new order given will continue to monitor patient.

## 2014-10-02 NOTE — Progress Notes (Signed)
Patient ID: Larry [name], male   DOB: Jun 15, 1950, 64 y.o.   MRN: [id number]   Subjective:    Larry [name] is a 64 y/o man with history of chronic systolic CHF (nonischemic cardiomyopathy) s/p [employer]. 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 He is on chronic milrinone 0.5 mcg.  Prior to admit he was being considered for LVAD.   Discharged 7/16 after dental procedures. Also had RHC with elevate pulmonary pressures. Revatio was increased to 80 mg tid during that stay. Admitted with fall---> broken L  hip. 7/18 had IM Nail L femur.  7/23 started CVVHD, CVVH stopped 7/31.  8/6 began having severe right ab/flank pain and was found to have large retrroperitoneal hematoma 8/7 with transfer to ICU. 8/18 VVS consulted for fistula.  Off anticoagulants using SCDs.   Fistula placed by VVS yesterday.   Denies SOB/Orthopnea. Seen today at HD.    Objective:   Weight Range:  Vital Signs:   Temp:  [97.1 F (36.2 C)-98.4 F (36.9 C)] 97.6 F (36.4 C) (08/24 0730) Pulse Rate:  [58-81] 66 (08/24 1006) Resp:  [13-20] 18 (08/24 0730) BP: (92-123)/(48-62) 92/48 mmHg (08/24 1006) SpO2:  [95 %-98 %] 96 % (08/24 0730) Weight:  [217 lb 3.2 oz (98.521 kg)-217 lb 13 oz (98.8 kg)] 217 lb 13 oz (98.8 kg) (08/24 0730) Last BM Date: 09/29/14  Weight change: Filed Weights   10/01/14 0517 10/02/14 0552 10/02/14 0730  Weight: 220 lb 14.4 oz (100.2 kg) 217 lb 3.2 oz (98.521 kg) 217 lb 13 oz (98.8 kg)    Intake/Output:   Intake/Output Summary (Last 24 hours) at 10/02/14 1025 Last data filed at 10/01/14 2135  Gross per 24 hour  Intake    350 ml  Output      0 ml  Net    350 ml     Physical Exam: General:  Getting HD. NAD.  HEENT: normal Neck: supple. JVP 8 cm. Tunneled HD catheter.  Carotids 2+ bilat; no bruits. No lymphadenopathy or thryomegaly appreciated. Cor: PMI nondisplaced. Regular rate & rhythm. No rubs, gallops or  murmurs.  AICD under left clavicle  Lungs: clear Abdomen: soft, nondistended. Large hematoma over right flank. + bowel sounds. Extremities: no cyanosis, clubbing, rash. R and LLE unna boots in place. Neuro: Alert/oriented Skin Large Bullae on dorsum right foot  And RLE  Telemetry: NSR 70s  Labs: Basic Metabolic Panel:  Recent Labs Lab 09/27/14 0827  09/29/14 0348 09/30/14 0318 10/01/14 0940 10/01/14 1701 10/02/14 0430  NA 134*  < > 132* 130* 134* 133* 130*  K 3.4*  < > 3.9 3.8 4.2 4.5 4.3  CL 99*  < > 98* 95* 99* 98* 96*  CO2 27  < > 25 25 25 26 25   GLUCOSE 119*  < > 120* 115* 103* 112* 101*  BUN 28*  < > 23* 34* 25* 27* 33*  CREATININE 3.07*  < > 3.16* 3.79* 3.22* 3.40* 3.86*  CALCIUM 8.3*  < > 8.2* 8.1* 8.5* 8.7* 8.3*  PHOS 3.6  --   --  5.2* 4.4 5.2* 5.4*  < > = values in this interval not displayed.  Liver Function Tests:  Recent Labs Lab 09/27/14 0827 09/30/14 0318 10/01/14 0940 10/01/14 1701 10/02/14 0430  ALBUMIN 2.5* 2.7* 2.9* 2.8* 2.7*    CBC:  Recent Labs Lab 09/27/14 0812 09/28/14 1827 10/01/14 1701 10/02/14 0930  WBC 5.2 6.2 5.3 5.1  NEUTROABS 3.2  --   --  2.9  HGB 9.7* 10.9* 10.4* 9.9*  HCT 30.9* 33.6* 33.3* 31.5*  MCV 99.7 97.4 98.5 99.1  PLT 214 112* 157 178    Cardiac Enzymes: No results for input(s): CKTOTAL, CKMB, CKMBINDEX, TROPONINI in the last 168 hours.  BNP: BNP (last 3 results)  Recent Labs  05/22/14 1029 05/27/14 1126 06/19/14 1030  BNP 935.2* 1627.8* 738.9*    ProBNP (last 3 results)  Recent Labs  10/09/13 0926 11/14/13 0926 01/14/14 1112  PROBNP 1561.0* 1261.0* 5769.0*      Other results:   Imaging: No results found.   Medications:     Scheduled Medications: . amiodarone  200 mg Oral Daily  . atorvastatin  40 mg Oral q1800  . bisacodyl  5 mg Oral QHS  . darbepoetin (ARANESP) injection - DIALYSIS  100 mcg Intravenous Q Mon-HD  . DULoxetine  30 mg Oral Daily  . feeding supplement  1 Container  Oral Q24H  . feeding supplement (NEPRO CARB STEADY)  237 mL Oral Q24H  . insulin aspart  0-9 Units Subcutaneous TID AC & HS  . levothyroxine  75 mcg Oral QAC breakfast  . midodrine  10 mg Oral TID WC  . multivitamin  1 tablet Oral QHS  . sildenafil  80 mg Oral TID    Infusions: . sodium chloride 10 mL/hr at 10/01/14 1146  . sodium chloride irrigation      PRN Medications: sodium chloride, sodium chloride, albuterol, ALPRAZolam, alteplase, bisacodyl, feeding supplement (NEPRO CARB STEADY), fentaNYL (SUBLIMAZE) injection, heparin, heparin, lidocaine (PF), lidocaine-prilocaine, ondansetron (ZOFRAN) IV, oxyCODONE-acetaminophen **AND** oxyCODONE, pentafluoroprop-tetrafluoroeth, polyethylene glycol, sodium chloride, temazepam   Assessment:   1. Left Hip Fracture s/p IM nail on 08/26/14 2. Chronic systolic CHF- Nonischemic cardiomyopathy, on home milrinone 0.5 mcg/kg/min, decreased to 0.25 here with NSVT. Became hypotensive, transitioned to dobutamine 2.5 => now off dobutamine as well.  3. Acute on chronic renal failure - suspect post-op ATN.  CVVH => HD.  4.  Paroxysmal atrial fibrillation requiring DCCV 02/2014, 04/2014- on chronic amio and xarelto prior to admission. CHADS VASc Score- 5.  Remains in NSR.  5. Diabetes mellitus type 2 6. Depression/anxiety 7. OSA, complex- Needs Bipap at night but had difficulty after dental procedure.  8 Pulmonary HTN - Mixed pulmonary venous hypertension and PAH- on Revatio 80 mg tid 9. Dental extractions 08/2014 by Dr Enrique Sack 10. Obesity 11. Probable chronic respiratory failure - requiring home O2 with ambulation as of this admission 12. Hyponatremia   13. NSVT 14. Delirium- Improved.   15. Hemorrhagic shock with massive retroperitoneal hematoma 8/6 => off anticoagulation.  16.Stage II Pressure Ulcers- Heel Pressure ulcer  Plan/Discussion:    Tolerating HD. No longer on inotropes.  Remains volume overloaded on exam and continues to need fluid off via  dialysis. Fistula placed yesterday.  Getting HD today.  Remains on midodrine.  Last hgb stable. Continue SCDs instead of low dose Lovenox after spontaneous RP hematoma. Will need to leave off anticoagulation for the time being despite CVA risk from atrial fibrillation.  After a few weeks, will start coumadin with goal INR 2-2.5.  Now that he is off inotropes, amio cut back to once daily. Remains in NSR.   Appreciate wound care consult for Bullae on feet.  Continue Unna Boots R and LLE .     Disposition: SW following- to SNF or LTACH, ?tomorrow.  Screening for [facility].     Loralie Champagne  10:25 AM  10/02/2014      

## 2014-10-02 NOTE — Clinical Social Work Note (Addendum)
CSW spoke with the pt's wife Peter Congo. Peter Congo is in agreement with the pt going to [facility]. Santiago Glad from [facility] is awaiting insurance authorization for admission.   Addendum: Peter Congo also understands that if the insurance does not approval the pt then he will transition to SNF. Peter Congo prefers [facility], however [facility] has not offer a bed to the pt. CSW and Peter Congo recapped the current SNF bed offers.    [place], MSW, [place]

## 2014-10-02 NOTE — Progress Notes (Signed)
PT Cancellation Note  Patient Details Name: Larry [name] MRN: [id number] DOB: Jul 12, 1950   Cancelled Treatment:    Reason Eval/Treat Not Completed: Medical issues which prohibited therapy. Pt with runs of v-tach this AM during dialysis. Per notes questioning of Pacemaker issues?  Spoke with nursing who stated to hold PT today.   Carly Applegate LUBECK 10/02/2014, 2:22 PM

## 2014-10-02 NOTE — Progress Notes (Signed)
S: s/p avf placement, denies any sob or cp currently, asking if his ICD is back in and working.   O:BP 104/60 mmHg  Pulse 70  Temp(Src) 98.4 F (36.9 C) (Oral)  Resp 20  Ht 5\' 10"  (1.778 m)  Wt 217 lb 3.2 oz (98.521 kg)  BMI 31.16 kg/m2  SpO2 95%  Intake/Output Summary (Last 24 hours) at 10/02/14 0744 Last data filed at 10/01/14 2135  Gross per 24 hour  Intake    350 ml  Output      0 ml  Net    350 ml   Weight change: -6 lb 9.1 oz (-2.979 kg) Gen: Awake and alert, overall looks less edematous. CVS: RRR Resp: CTAB.  Abd: + BS, NDNT. Ecchymoses on right flank.   Ext: 1+ pitting edmea b/l LE's, improving slowly. Legs wrapped with dressing.  NEURO: CNI Ox3 no asterixis Rt IJ permcath Rt Arm PICC Left arm has some wounds.    [name] amiodarone  200 mg Oral Daily  . atorvastatin  40 mg Oral q1800  . bisacodyl  5 mg Oral QHS  . darbepoetin (ARANESP) injection - DIALYSIS  100 mcg Intravenous Q Mon-HD  . DULoxetine  30 mg Oral Daily  . feeding supplement  1 Container Oral Q24H  . feeding supplement (NEPRO CARB STEADY)  237 mL Oral Q24H  . insulin aspart  0-9 Units Subcutaneous TID AC & HS  . levothyroxine  75 mcg Oral QAC breakfast  . midodrine      . midodrine  10 mg Oral TID WC  . multivitamin  1 tablet Oral QHS  . sildenafil  80 mg Oral TID   No results found. BMET    Component Value Date/Time   NA 130* 10/02/2014 0430   K 4.3 10/02/2014 0430   CL 96* 10/02/2014 0430   CO2 25 10/02/2014 0430   GLUCOSE 101* 10/02/2014 0430   BUN 33* 10/02/2014 0430   CREATININE 3.86* 10/02/2014 0430   CALCIUM 8.3* 10/02/2014 0430   CALCIUM 7.9* 09/20/2014 1315   GFRNONAA 15* 10/02/2014 0430   GFRAA 18* 10/02/2014 0430   CBC    Component Value Date/Time   WBC 5.3 10/01/2014 1701   RBC 3.38* 10/01/2014 1701   HGB 10.4* 10/01/2014 1701   HCT 33.3* 10/01/2014 1701   PLT 157 10/01/2014 1701   MCV 98.5 10/01/2014 1701   MCH 30.8 10/01/2014 1701   MCHC 31.2 10/01/2014 1701   RDW  20.4* 10/01/2014 1701   LYMPHSABS 1.0 09/27/2014 0812   MONOABS 0.7 09/27/2014 0812   EOSABS 0.3 09/27/2014 0812   BASOSABS 0.1 09/27/2014 8938     Assessment:   64 yo M with hxo f DM II, PAF, hypothyroidism, CAD, 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   1. New ESRD -likely 2/2 to blood loss and ATN.  remains volume overloaded but improving with HD. S/p right radiocephalic AVF placement 02/08/73. Has multiple sessions of HD here, plan is HD 4xweek to help with volume removal. 2. Severe cardiomyopathy s/p ICD EF 25%. Not candidate for LVAD. CHF team on board.  3. Abd wall hematoma, improving - holding xarelto. hgb stable.  4. SP Lt hip fx 5. Anemia on aranesp, received IV iron 6. Sec HPTH not requiring Vit D  PTH 90 on 8/8 7. Bullae on legs - 2/2 to volume overload -improving, wound care following.  8. Anxiety -especially at HD sessions - on xanax currently. Follows with  outpatient psych.  Plan:  1.  Plan for HD 4x week M,T,Th,Sat (M,W,F,Sat while here because of inpatient census) 3. Continue midodrine to support his BP and allow good volume removal.  4. Cont to work with PT. Rehab pending.  5. Outpatient HD being arranged.   [name], Tasrif   I have seen and examined this patient and agree with plan as outlined by Dr. Genene Churn. Patient was seen on dialysis and the procedure was supervised. BFR 400 Via RIJ TDC BP is 106/59.  Patient appears to be tolerating treatment well . Allia Wiltsey A,MD 10/02/2014 9:05 AM

## 2014-10-02 NOTE — Progress Notes (Signed)
PT Cancellation Note  Patient Details Name: Larry [name] MRN: [id number] DOB: July 17, 1950   Cancelled Treatment:    Reason Eval/Treat Not Completed: Patient at procedure or test/unavailable. Pt off floor at dialysis since about 8:00.  Will check back later today as schedule permits.   Stylianos Stradling LUBECK 10/02/2014, 9:25 AM

## 2014-10-02 NOTE — Progress Notes (Signed)
Call received from telemetry noted 5 beat run of vtach,, pt sitting in chair asymptomatic. MD aware; ? Pacer/ICD function...charge nurse Ty made aware as well. Margarito Liner

## 2014-10-02 NOTE — Progress Notes (Signed)
Referral to LTAC in progress. [facility] contacted, per Santiago Glad with [facility], patient does not meet their criteria for admission;  Santiago Glad with [facility] stated that patient meets their criteria for admission and is very interested in accepting the patient. Santiago Glad is awaiting on insurance authorization for admission. CM following for DCP. Mindi Slicker [facility] [phone]

## 2014-10-02 NOTE — Progress Notes (Signed)
MD Surgeon Percell Miller came to see patient, patient was in HD, we notified the MD that we would not be able to remove the staples from the incisional site per RN Taiwo whom was present. MD Percell Miller stated he would be back tomorrow or Monday to see patient at bedside.

## 2014-10-02 NOTE — Progress Notes (Addendum)
  [facility] [place] Postoperative hemodialysis access   Date of Surgery:  10/01/2014 Surgeon: Dr. Oneida Alar  Subjective:  Denies hand pain.   PHYSICAL EXAMINATION:  Filed Vitals:   10/02/14 1006  BP: 92/48  Pulse: 66  Temp:   Resp:     Incision is clean and intact Hand grip is 5/5  Sensation in digits is intact;  2+ right radial pulse Audible bruit right radiocephalic AVF   ASSESSMENT/PLAN:  Larry [name] is a 64 y.o. year old male who is s/p right radiocephalic AVF  -graft is patent -pt does have evidence of steal sx -f/u with Dr. Oneida Alar in 4-6 weeks to check maturation of AVF -will sign off-call as needed.   Virgina Jock, PA-C [facility] [phone]  I agree with the above.  The patient will follow up in 6 weeks for fistula evaluation with Dr. Oneida Alar.  Annamarie Major

## 2014-10-02 NOTE — Progress Notes (Signed)
Patient transported to HD by HD RN during report at bedside with RN Adonis Huguenin present. All morning medications held and moved to be given post HD, patient assessment needs to be completed when he returns from dialysis due to him being transported at change of shift.

## 2014-10-02 NOTE — Progress Notes (Signed)
Heart Failure PA [place] paged, I was made aware per prior night shift RN Adonis Huguenin that the patient's ICD was not verified as "turned on" post surgery. Asked if he could find someone to come to the patient's bedside to check patient ICD.

## 2014-10-03 ENCOUNTER — Telehealth: Payer: Self-pay | Admitting: Vascular Surgery

## 2014-10-03 DIAGNOSIS — K08109 Complete loss of teeth, unspecified cause, unspecified class: Secondary | ICD-10-CM

## 2014-10-03 LAB — RENAL FUNCTION PANEL
ALBUMIN: 2.9 g/dL — AB (ref 3.5–5.0)
Anion gap: 9 (ref 5–15)
BUN: 19 mg/dL (ref 6–20)
CALCIUM: 8.5 mg/dL — AB (ref 8.9–10.3)
CO2: 29 mmol/L (ref 22–32)
CREATININE: 3.18 mg/dL — AB (ref 0.61–1.24)
Chloride: 97 mmol/L — ABNORMAL LOW (ref 101–111)
GFR calc Af Amer: 23 mL/min — ABNORMAL LOW (ref >60)
GFR calc non Af Amer: 19 mL/min — ABNORMAL LOW (ref >60)
GLUCOSE: 108 mg/dL — AB (ref 65–99)
PHOSPHORUS: 4.4 mg/dL (ref 2.5–4.6)
Potassium: 3.8 mmol/L (ref 3.5–5.1)
SODIUM: 135 mmol/L (ref 135–145)

## 2014-10-03 LAB — CBC
HCT: 32.6 % — ABNORMAL LOW (ref 39.0–52.0)
Hemoglobin: 10.2 g/dL — ABNORMAL LOW (ref 13.0–17.0)
MCH: 31.2 pg (ref 26.0–34.0)
MCHC: 31.3 g/dL (ref 30.0–36.0)
MCV: 99.7 fL (ref 78.0–100.0)
Platelets: 158 10*3/uL (ref 150–400)
RBC: 3.27 MIL/uL — ABNORMAL LOW (ref 4.22–5.81)
RDW: 20.7 % — AB (ref 11.5–15.5)
WBC: 5.2 10*3/uL (ref 4.0–10.5)

## 2014-10-03 LAB — GLUCOSE, CAPILLARY
GLUCOSE-CAPILLARY: 99 mg/dL (ref 65–99)
Glucose-Capillary: 154 mg/dL — ABNORMAL HIGH (ref 65–99)
Glucose-Capillary: 99 mg/dL (ref 65–99)
Glucose-Capillary: 117 mg/dL — ABNORMAL HIGH (ref 65–99)

## 2014-10-03 LAB — MAGNESIUM: MAGNESIUM: 1.9 mg/dL (ref 1.7–2.4)

## 2014-10-03 NOTE — Progress Notes (Signed)
Orthopedic Tech Progress Note Patient Details:  Larry [name] 1950-12-23 [id number]  Ortho Devices Type of Ortho Device: Louretta Parma boot Ortho Device/Splint Location: (B) LE Ortho Device/Splint Interventions: Ordered, Application   [name] 10/03/2014, 3:31 PM

## 2014-10-03 NOTE — Clinical Social Work Note (Signed)
CSW talked with patient and wife Peter Congo regarding d/c plans. They continue to want [facility], however if [facility] is not an option their SNF choice is [facility]. CSW will continue to follow.  [place] Givens, MSW, LCSW Licensed Clinical Social Worker [place] [phone]

## 2014-10-03 NOTE — Progress Notes (Signed)
Awaiting on Insurance approval for LTAC.  [place] did not approve LTAC for [facility] per Santiago Glad RN with [place] for insurance approval for [facility] Worker, patient has a bed available at [facility] - soc worker working on [employer] 4 times per week.

## 2014-10-03 NOTE — Progress Notes (Signed)
Occupational Therapy Treatment Patient Details Name: Larry [name] MRN: [id number] DOB: 08-Nov-1950 Today's Date: 10/03/2014    History of present illness Larry [name] is a 64 y.o. male who complains of left hip pain after sustaining a mechanical fall at home. Pt with left intertrochanteric fx s/p IM nail. PMHx NICM, CAD, DDD, back pain, CVA, obesity. 7/23 CRRT initiated, 7/31 CRRT discontinued.   OT comments  Pt very bothered by compression wrap on LEs, RN aware.  Pt able to stand at sink for 2 grooming activities followed by ambulation in hall for endurance.  Requiring standing rest breaks when ambulating. Eager to progress to next venue.  Follow Up Recommendations  SNF    Equipment Recommendations  3 in 1 bedside comode;Hospital bed    Recommendations for Other Services      Precautions / Restrictions Precautions Precautions: Fall Restrictions Weight Bearing Restrictions: No LLE Weight Bearing: Weight bearing as tolerated       Mobility Bed Mobility               General bed mobility comments: pt in chair  Transfers   Equipment used: Rolling walker (2 wheeled)   Sit to Stand: Min guard         General transfer comment: assist to steady walker as he uses one hand on walker and one on chair when standing    Balance     Sitting balance-Leahy Scale: Good       Standing balance-Leahy Scale: Fair Standing balance comment: able to release walker and stand at sink for 2 activities                   ADL Overall ADL's : Needs assistance/impaired     Grooming: Wash/dry hands;Brushing hair;Standing;Supervision/safety           Upper Body Dressing : Sitting;Minimal assistance Upper Body Dressing Details (indicate cue type and reason): front opening gown                 Functional mobility during ADLs: Min guard;Rolling walker General ADL Comments: Ambulated in hall with chair following for endurance.      Vision                      Perception     Praxis      Cognition   Behavior During Therapy: WFL for tasks assessed/performed Overall Cognitive Status: Within Functional Limits for tasks assessed                       Extremity/Trunk Assessment               Exercises     Shoulder Instructions       General Comments      Pertinent Vitals/ Pain       Pain Assessment: Faces Faces Pain Scale: Hurts little more Pain Location: feet/LEs Pain Descriptors / Indicators: Aching Pain Intervention(s): Limited activity within patient's tolerance;Monitored during session;Repositioned  Home Living                                          Prior Functioning/Environment              Frequency Min 2X/week     Progress Toward Goals  OT Goals(current goals can now be found in the care plan section)  Progress towards  OT goals: Progressing toward goals  Acute Rehab OT Goals Patient Stated Goal: return home able to care for himself  Plan Discharge plan remains appropriate    Co-evaluation                 End of Session Equipment Utilized During Treatment: Rolling walker   Activity Tolerance Patient tolerated treatment well   Patient Left in chair;with call bell/phone within reach;with family/visitor present   Nurse Communication          Time: 2725-3664 OT Time Calculation (min): 33 min  Charges: OT General Charges $OT Visit: 1 Procedure OT Treatments $Self Care/Home Management : 8-22 mins $Therapeutic Activity: 8-22 mins  [name] 10/03/2014, 11:48 AM  [phone]

## 2014-10-03 NOTE — Progress Notes (Signed)
POST OPERATIVE NOTE:  10/03/2014   Sherrilee Gilles [id number]  VITALS: BP 101/56 mmHg  Pulse 71  Temp(Src) 97.8 F (36.6 C) (Oral)  Resp 18  Ht 5\' 10"  (1.778 m)  Wt 218 lb (98.884 kg)  BMI 31.28 kg/m2  SpO2 95%  LABS:  Lab Results  Component Value Date   WBC 5.2 10/03/2014   HGB 10.2* 10/03/2014   HCT 32.6* 10/03/2014   MCV 99.7 10/03/2014   PLT 158 10/03/2014   BMET    Component Value Date/Time   NA 135 10/03/2014 0628   K 3.8 10/03/2014 0628   CL 97* 10/03/2014 0628   CO2 29 10/03/2014 0628   GLUCOSE 108* 10/03/2014 0628   BUN 19 10/03/2014 0628   CREATININE 3.18* 10/03/2014 0628   CALCIUM 8.5* 10/03/2014 0628   CALCIUM 7.9* 09/20/2014 1315   GFRNONAA 19* 10/03/2014 0628   GFRAA 23* 10/03/2014 0628    Lab Results  Component Value Date   INR 1.21 10/01/2014   INR 1.18 09/19/2014   INR 1.28 09/18/2014   No results found for: PTT   Sherrilee Gilles is status post extraction of remaining teeth with alveoloplasty in the operating room with general anesthesia on 08/22/2014. Dental procedures performed as part of a pre-LVAD placement dental protocol. Patient had a fall at home with hip fracture and is status post intramedullary nail fixation. Patient has had protracted hospital course since that time.   SUBJECTIVE: Patient denies having any problems with his mouth and extraction sites. Patient is interested in proceeding with upper lower complete denture fabrication.  EXAM: Patient is edentulous. Extraction sites are healing in well. There is no evidence of sinus involvement or fistulous tracts. Valsalva maneuver is negative. There is atrophy of the edentulous alveolar ridges. Normal saliva is noted.   ASSESSMENT: Post operative course is consistent with dental procedures performed in the operating room on 08/22/2014. Patient is edentulous. There is atrophy of edentulous alveolar ridges.  PLAN: 1. Patient to be discharged to skilled nursing facility for  physical therapy for deconditioning. 2. Patient to contact dental medicine to arrange for evaluation for start of upper lower complete dentures once medically stable.   Lenn Cal, DDS

## 2014-10-03 NOTE — Telephone Encounter (Signed)
-----   Message from Mena Goes, RN sent at 10/02/2014 12:08 PM EDT ----- Regarding: Schedule   ----- Message -----    From: Alvia Grove, PA-C    Sent: 10/02/2014  10:25 AM      To: Vvs Charge Pool  S/p right RC-AVF 10/02/14  F/u with [facility] in 6 weeks with duplex  Thanks Maudie Mercury

## 2014-10-03 NOTE — Progress Notes (Signed)
Dressing was removed by physician and stitches removed. Upper wound new dressing applied. Lower dressing open to air.

## 2014-10-03 NOTE — Discharge Summary (Signed)
Advanced Heart Failure Team  Discharge Summary   Patient ID: Larry [name] MRN: [id number], DOB/AGE: 62-Nov-1952 62 y.o. Admit date: 08/25/2014 D/C date:     10/11/2014   Primary Discharge Diagnoses:  1. Left Hip Fracture s/p IM nail on 08/26/14 2. Chronic systolic CHF- Nonischemic cardiomyopathy, on home milrinone 0.5 mcg/kg/min, decreased to 0.25 here with NSVT. Became hypotensive, transitioned to dobutamine 2.5 => now off dobutamine. Mixed venous saturation stable.  3. Acute on chronic renal failure - suspect post-op ATN. CVVH now on HD.  4. Paroxysmal atrial fibrillation requiring DCCV 02/2014, 04/2014- on chronic amio. Start coumadin in 1 weeks with INR goal 2-2.5  5. Diabetes mellitus type 2 6. Depression/anxiety 7. OSA, complex- Needs Bipap at night but had difficulty after dental procedure.  8 Pulmonary HTN - Mixed pulmonary venous hypertension and PAH- on Revatio 80 mg tid 9. Dental extractions 08/2014 by Dr Enrique Sack 10. Obesity 11. Probable chronic respiratory failure - requiring home O2 with ambulation as of this admission 12. Hyponatremia  13. NSVT 14. Delirium-  15. Hemorrhagic shock with massive retroperitoneal hematoma 8/6 => off anticoagulation. Hgb stable on discharge.  16.Stage II Pressure Ulcers- Heel Pressure ulcer 17. VT 10/09/2014 - Continue amiodarone tape.   Hospital Course:  Larry [name] is a 64 y.o. male, non ischemic cardiomyopathy with severe chronicSystolic CHF EF 47% on home milrinone drip, H/O VT dual-chamber pacemaker with AICD, paroxysmal atrial fibrillation on xaralto, insulin-dependent type 2 diabetes mellitus, history of lupus anti-coagulant positive, hypothyroidism, nonocclusive CAD per Cath 06/2014, nonfunctioning Pitutary microadenoma, chronic kidney disease stage IV baseline creatinine close to 1.9, obstructive sleep apnea not on C Pap/BiPAP, recent total teeth extraction done 2 days prior to admit for a possible LVAD procedure xarelto.    Admitted after fall on 7/17 and had L broken hip with IM nail to L femur on 7/18.  His hospital stay was complicated by hypotension, hemorrhagic shock, and acute/chronic renal failure.   1. Left Hip Fracture s/p IM nail on 08/26/14. He is WBAT. He will follow up Dr Percell Miller. Continue PT at SNF  2. Chronic systolic CHF- Nonischemic cardiomyopathy, prior to admit was on home milrinone 0.5 mcg/kg/min, decreased to 0.25 with NSVT. Became hypotensive so he transitioned to 2.5 mcg of dobutamine. This was later weaned off as his blood pressure improved and mixed venous sat was stable. Unfortunately he progressive renal failure and now requiring HD. Volume status now managed with HD. No bb with hypotension. No ace with CKD.  3. Acute on chronic renal failure - suspect post-op ATN. Placed CVVHD on 7/23 through 09/08/14 . Now requiring ongoing HD. AVF placed 8/23 by Dr Oneida Alar.  Per nephrology he will need HD 4 days a week. Requiring midodrine 10 mg three times daily. He will continue HD M-W-F-Sat at [facility] dialysis center. Today he was able to tolerate dialysis in the chair. He will begin outpatient dialysis tomorrow-->10/12/14. .  4. Paroxysmal atrial fibrillation requiring DCCV 02/2014, 04/2014-  With retropertitoneal bleed while hospitalized he will need to hold on coumadin for another week. [facility]in can be started at [facility]. Once coumadin restarted will shot for INR 2-2.5.  He will continue amiodarone 400 mg twice a day for 1 week then 200 mg twice a day.  5. Diabetes mellitus type 2- Continue current insulin regimen. Will need sliding scale coverage AC and HS skilled facility.  6. Depression/anxiety- Remained relatively stable. He will continue on antidepressants and antianxiety medications.  7. OSA, complex- Needs Bipap at  night but had difficulty after dental procedure. Can resume nightly if he tolerates.  8 Pulmonary HTN - Mixed pulmonary venous hypertension and PAH- on Revatio 80 mg tid. He will continue.   9. Dental extractions 08/22/2014 by Dr Enrique Sack- He was evaluated while hospitalized on 8/25 with plans for upper and lower dentures.  10. Obesity 11. Probable chronic respiratory failure - requiring home O2 with ambulation . Continue oxygen as needed to maintain oxygen saturation 88% or greater.  12. Hyponatremia  13. NSVT- He will continue amiodarone 400 mg twice a day x1 week then 200 mg twice a day.   14. Delirium- Resolved.  15. Hemorrhagic shock with massive retroperitoneal hematoma 8/6 => CT of abd/pelvis R peritoneal hematoma. off anticoagulation. Received a total of  2UFFP and 11UPRBCs. Hemoglobin on discharge was 11.2  16.Stage II Pressure Ulcers- Heel Pressure ulcer. Will need to continue to offload R and L heels.Continue unna boots R and LLE. Change weekly.  17. VT- 8/31 Reloaded on amiodarone. Transitioned back to po 400 mg twice a day for 1 week then decrease to 200 mg twice a day.   SIGNIFICANT EVENTS: 7/17 Admit, ortho consulted 7/18 TO the OR for Lt femoral intramedullary nailing 7/21 to SDU 7/22 Renal consulted 7/23 started CRRT, changed from [place] to heparin gtt 7/24 milrinone restarted 7/27 d/c milrinone, start levophed 7/28 add dobutamine 7/31 change to intermittent HD 8/05 IR placed Rt IJ tunneled dialysis catheter 8/06 Rt abd/flank pain, heparin d/c, transfuse PRBC 8/07 To ICU 8/9 no further bleeding, plan HD today 8/23 AVF placed 8/31 VT reloaded on amiodarone 9/2 Tolerated HD in the chair.    [place]  Nephrology  Vascular Surgery    Discharge Weight Range: 215 pounds  Discharge Vitals: Blood pressure 94/61, pulse 66, temperature 98.9 F (37.2 C), temperature source Oral, resp. rate 16, height 5\' 10"  (1.778 m), weight 215 lb 13.3 oz (97.9 kg), SpO2 94 %.  Labs: Lab Results  Component Value Date   WBC 7.4 10/11/2014   HGB 11.2* 10/11/2014   HCT 35.9* 10/11/2014   MCV 100.8* 10/11/2014   PLT 216 10/11/2014     Recent Labs Lab  10/11/14 0327  NA 128*  K 4.3  CL 94*  CO2 23  BUN 25*  CREATININE 4.86*  CALCIUM 8.6*  GLUCOSE 130*   Lab Results  Component Value Date   CHOL 92 05/28/2014   HDL 28* 05/28/2014   LDLCALC 37 05/28/2014   TRIG 137 05/28/2014   BNP (last 3 results)  Recent Labs  05/22/14 1029 05/27/14 1126 06/19/14 1030  BNP 935.2* 1627.8* 738.9*    ProBNP (last 3 results)  Recent Labs  11/14/13 0926 01/14/14 1112  PROBNP 1261.0* 5769.0*     Diagnostic Studies/Procedures   No results found.  Discharge Medications     Medication List    STOP taking these medications        digoxin 0.125 MG tablet  Commonly known as:  LANOXIN     GLUCAGON EMERGENCY 1 MG injection  Generic drug:  glucagon     hydrALAZINE 100 MG tablet  Commonly known as:  APRESOLINE     metoprolol succinate 25 MG 24 hr tablet  Commonly known as:  TOPROL-XL     milrinone 20 MG/100ML Soln infusion  Commonly known as:  PRIMACOR     multivitamin with minerals Tabs tablet     Potassium Chloride ER 20 MEQ Tbcr     Rivaroxaban 15 MG Tabs tablet  Commonly known as:  XARELTO     spironolactone 25 MG tablet  Commonly known as:  ALDACTONE     torsemide 20 MG tablet  Commonly known as:  DEMADEX     zolpidem 5 MG tablet  Commonly known as:  AMBIEN      TAKE these medications        ALPRAZolam 1 MG tablet  Commonly known as:  XANAX  Take 1 tablet (1 mg total) by mouth 2 (two) times daily as needed for anxiety (be careful with extra sedation/somnolence).     amiodarone 400 MG tablet  Commonly known as:  PACERONE  Take 400 mg twice a day x1 week  Then 200 mg twice a day     atorvastatin 40 MG tablet  Commonly known as:  LIPITOR  TAKE 1 TABLET DAILY     bisacodyl 5 MG EC tablet  Commonly known as:  DULCOLAX  Take 5 mg by mouth at bedtime.     bisacodyl 10 MG suppository  Commonly known as:  DULCOLAX  Place 1 suppository (10 mg total) rectally daily as needed for moderate constipation.      chlorhexidine 0.12 % solution  Commonly known as:  PERIDEX  Perform mouth rinses twice daily after breakfast and at bedtime.     DULoxetine 30 MG capsule  Commonly known as:  CYMBALTA  Take 60 mg by mouth 2 (two) times daily.     HUMALOG KWIKPEN 100 UNIT/ML KiwkPen  Generic drug:  insulin lispro  Inject 30 Units into the skin 3 (three) times daily before meals.     insulin glargine 100 unit/mL Sopn  Commonly known as:  LANTUS  Inject 30 Units into the skin at bedtime.     levothyroxine 75 MCG tablet  Commonly known as:  SYNTHROID, LEVOTHROID  TAKE 1 TABLET ON AN EMPTY STOMACH 30 MINUTES BEFORE BREAKFAST     Melatonin 10 MG Tabs  Take 10 mg by mouth at bedtime as needed (sleep).     midodrine 10 MG tablet  Commonly known as:  PROAMATINE  Take 1 tablet (10 mg total) by mouth 3 (three) times daily.     multivitamin Tabs tablet  Take 1 tablet by mouth at bedtime.     ondansetron 4 MG tablet  Commonly known as:  ZOFRAN  Take 1 tablet (4 mg total) by mouth every 8 (eight) hours as needed for nausea or vomiting.     oxyCODONE-acetaminophen 10-325 MG per tablet  Commonly known as:  PERCOCET  Take 1 tablet by mouth every 4 (four) hours as needed for pain.     polyethylene glycol packet  Commonly known as:  MIRALAX / GLYCOLAX  Take 17 g by mouth daily as needed for moderate constipation.     sildenafil 20 MG tablet  Commonly known as:  REVATIO  Take 4 tablets (80 mg total) by mouth 3 (three) times daily.     temazepam 7.5 MG capsule  Commonly known as:  RESTORIL  Take 1 capsule (7.5 mg total) by mouth at bedtime as needed for sleep.        Disposition   The patient will be discharged in stable condition to home. Discharge Instructions    Contraindication to ACEI at discharge    Complete by:  As directed      Diet - low sodium heart healthy    Complete by:  As directed      Heart Failure patients record your daily weight using the  same scale at the same time of  day    Complete by:  As directed      Increase activity slowly    Complete by:  As directed      Weight bearing as tolerated    Complete by:  As directed   Laterality:  left  Extremity:  Lower          Follow-up Information    Follow up with MURPHY, TIMOTHY D, MD In 1 week.   Specialty:  Orthopedic Surgery   Contact information:   [address] [place] [address] [phone]       Follow up with Ruta Hinds, MD In 6 weeks.   Specialties:  Vascular Surgery, Cardiology   Why:  Our office will call you to arrange an appointment (sent)   Contact information:   [address] [place] [address] [phone]       Follow up with Loralie Champagne, MD In 2 weeks.   Specialty:  Cardiology   Why:  Please call if you do not hear from [place] within 2 business days of your discharge.  For post hospital follow up.    Contact information:   [address] [place] [address] [phone]         Duration of Discharge Encounter: Greater than 35 minutes   Signed, CLEGG,AMY NP-C  11:25 AM

## 2014-10-03 NOTE — Progress Notes (Signed)
Patient ID: Larry [name], male   DOB: [birth year], 64 y.o.   MRN: [id number]  [facility] Progress Note    Subjective:   Feels okay but still weak   Objective:   BP 99/54 mmHg  Pulse 71  Temp(Src) 97.8 F (36.6 C) (Oral)  Resp 18  Ht 5\' 10"  (1.778 m)  Wt 98.884 kg (218 lb)  BMI 31.28 kg/m2  SpO2 95%  Intake/Output: I/O last 3 completed shifts: In: 1280 [P.O.:1280] Out: 2876 [Urine:50; Other:2826]   Intake/Output this shift:  Total I/O In: 220 [P.O.:220] Out: -  Weight change: 0.279 kg (9.8 oz)  Physical Exam: EYC:XKGYJ, elderly, chronically ill-appearing WM sitting upright in chair in NAd CVS:no rub Resp:decreased BS at bases EHU:DJSHFW Ext:2-3+ edema with UNA boots in place, right cimino AVF +T/B  Labs: BMET  Recent Labs Lab 09/27/14 0827 09/28/14 0323 09/29/14 0348 09/30/14 0318 10/01/14 0940 10/01/14 1701 10/02/14 0430 10/03/14 0628  NA 134* 134* 132* 130* 134* 133* 130* 135  K 3.4* 3.5 3.9 3.8 4.2 4.5 4.3 3.8  CL 99* 100* 98* 95* 99* 98* 96* 97*  CO2 27 26 25 25 25 26 25 29   GLUCOSE 119* 126* 120* 115* 103* 112* 101* 108*  BUN 28* 25* 23* 34* 25* 27* 33* 19  CREATININE 3.07* 3.27* 3.16* 3.79* 3.22* 3.40* 3.86* 3.18*  ALBUMIN 2.5*  --   --  2.7* 2.9* 2.8* 2.7* 2.9*  CALCIUM 8.3* 8.3* 8.2* 8.1* 8.5* 8.7* 8.3* 8.5*  PHOS 3.6  --   --  5.2* 4.4 5.2* 5.4* 4.4   CBC  Recent Labs Lab 09/27/14 0812 09/28/14 1827 10/01/14 1701 10/02/14 0930 10/03/14 0628  WBC 5.2 6.2 5.3 5.1 5.2  NEUTROABS 3.2  --   --  2.9  --   HGB 9.7* 10.9* 10.4* 9.9* 10.2*  HCT 30.9* 33.6* 33.3* 31.5* 32.6*  MCV 99.7 97.4 98.5 99.1 99.7  PLT 214 112* 157 178 158    @IMGRELPRIORS @ Medications:    . amiodarone  200 mg Oral Daily  . atorvastatin  40 mg Oral q1800  . bisacodyl  5 mg Oral QHS  . darbepoetin (ARANESP) injection - DIALYSIS  100 mcg Intravenous Q Mon-HD  . DULoxetine  30 mg Oral Daily  . feeding supplement  1 Container Oral Q24H  . feeding  supplement (NEPRO CARB STEADY)  237 mL Oral Q24H  . insulin aspart  0-9 Units Subcutaneous TID AC & HS  . levothyroxine  75 mcg Oral QAC breakfast  . midodrine  10 mg Oral TID WC  . multivitamin  1 tablet Oral QHS  . 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  1. New ESRD -likely 2/2 to blood loss and ATN and cardiorenal syndrome. remains volume overloaded but improving with HD. S/p right radiocephalic AVF placement 2/63/78. Has multiple sessions of HD here, plan is HD 4xweek to help with volume removal. 2. Severe cardiomyopathy s/p ICD EF 25%. Not candidate for LVAD. CHF team on board.  3. Abd wall hematoma, improving - holding xarelto. hgb stable.  4. SP Lt hip fx 5. Anemia on aranesp, received IV iron 6. Sec HPTH not requiring Vit D PTH 90 on 8/8 7. Bullae on legs - 2/2 to volume overload -improving, wound care following.  8. Anxiety -especially at HD sessions -  on xanax currently. Follows with outpatient psych.  Plan:  1. Plan for HD 4x week M,T,Th,Sat (M,W,F,Sat while here because of inpatient census).   3. Continue midodrine to support his BP and allow good volume removal.  4. Cont to work with PT. Rehab pending.  5. He is currently not a candidate for outpatient dialysis as he would be too weak to go back and forth from SNF to outpt HD unit 4 days a week and recommend [facility] who can continue hemodialysis regularly in his bed while also receiving PT/OT. 6. Would also recommend palliative care consult to help set goals/limits of care as he is still a full code and his overall prognosis with end-stage CMP and ESRD is poor. [place] A 10/03/2014, 12:25 PM

## 2014-10-03 NOTE — Progress Notes (Signed)
Patient ID: Larry [name], male   DOB: 1950/10/12, 64 y.o.   MRN: [id number]   Subjective:    Larry [name] is a 64 y/o man with history of chronic systolic CHF (nonischemic cardiomyopathy) s/p St. 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 He is on chronic milrinone 0.5 mcg.  Prior to admit he was being considered for LVAD.   Discharged 7/16 after dental procedures. Also had RHC with elevate pulmonary pressures. Revatio was increased to 80 mg tid during that stay. Admitted with fall---> broken L  hip. 7/18 had IM Nail L femur.  7/23 started CVVHD, CVVH stopped 7/31.  8/6 began having severe right ab/flank pain and was found to have large retrroperitoneal hematoma 8/7 with transfer to ICU. 8/18 VVS consulted for fistula.  Off anticoagulants using SCDs.   Fistula placed by VVS yesterday.   Denies SOB/Orthopnea. HD yesterday.     Objective:   Weight Range:  Vital Signs:   Temp:  [97.6 F (36.4 C)-97.8 F (36.6 C)] 97.8 F (36.6 C) (08/25 0459) Pulse Rate:  [66-73] 71 (08/25 0459) Resp:  [18] 18 (08/25 0459) BP: (92-112)/(48-60) 101/56 mmHg (08/25 0459) SpO2:  [95 %-100 %] 95 % (08/25 0459) Weight:  [218 lb (98.884 kg)] 218 lb (98.884 kg) (08/25 0459) Last BM Date: 09/29/14  Weight change: Filed Weights   10/02/14 0552 10/02/14 0730 10/03/14 0459  Weight: 217 lb 3.2 oz (98.521 kg) 217 lb 13 oz (98.8 kg) 218 lb (98.884 kg)    Intake/Output:   Intake/Output Summary (Last 24 hours) at 10/03/14 0810 Last data filed at 10/03/14 0229  Gross per 24 hour  Intake    920 ml  Output   2876 ml  Net  -1956 ml     Physical Exam: General:  Getting HD. NAD.  HEENT: normal Neck: supple. JVP 7 cm. Tunneled HD catheter.  Carotids 2+ bilat; no bruits. No lymphadenopathy or thryomegaly appreciated. Cor: PMI nondisplaced. Regular rate & rhythm. No rubs, gallops or murmurs.  AICD under left clavicle  Lungs:  clear Abdomen: soft, nondistended. Large hematoma over right flank. + bowel sounds. Extremities: no cyanosis, clubbing, rash. R and LLE unna boots in place. 1+ edema to knees.  Neuro: Alert/oriented Skin: Large Bullae on dorsum right foot  And RLE  Telemetry: NSR 70s  Labs: Basic Metabolic Panel:  Recent Labs Lab 09/30/14 0318 10/01/14 0940 10/01/14 1701 10/02/14 0430 10/03/14 0628  NA 130* 134* 133* 130* 135  K 3.8 4.2 4.5 4.3 3.8  CL 95* 99* 98* 96* 97*  CO2 25 25 26 25 29   GLUCOSE 115* 103* 112* 101* 108*  BUN 34* 25* 27* 33* 19  CREATININE 3.79* 3.22* 3.40* 3.86* 3.18*  CALCIUM 8.1* 8.5* 8.7* 8.3* 8.5*  MG  --   --   --  2.1 1.9  PHOS 5.2* 4.4 5.2* 5.4* 4.4    Liver Function Tests:  Recent Labs Lab 09/30/14 0318 10/01/14 0940 10/01/14 1701 10/02/14 0430 10/03/14 0628  ALBUMIN 2.7* 2.9* 2.8* 2.7* 2.9*    CBC:  Recent Labs Lab 09/27/14 0812 09/28/14 1827 10/01/14 1701 10/02/14 0930 10/03/14 0628  WBC 5.2 6.2 5.3 5.1 5.2  NEUTROABS 3.2  --   --  2.9  --   HGB 9.7* 10.9* 10.4* 9.9* 10.2*  HCT 30.9* 33.6* 33.3* 31.5* 32.6*  MCV 99.7 97.4 98.5 99.1 99.7  PLT 214 112* 157 178 158    Cardiac  Enzymes: No results for input(s): CKTOTAL, CKMB, CKMBINDEX, TROPONINI in the last 168 hours.  BNP: BNP (last 3 results)  Recent Labs  05/22/14 1029 05/27/14 1126 06/19/14 1030  BNP 935.2* 1627.8* 738.9*    ProBNP (last 3 results)  Recent Labs  10/09/13 0926 11/14/13 0926 01/14/14 1112  PROBNP 1561.0* 1261.0* 5769.0*      Other results:   Imaging: No results found.   Medications:     Scheduled Medications: . amiodarone  200 mg Oral Daily  . atorvastatin  40 mg Oral q1800  . bisacodyl  5 mg Oral QHS  . darbepoetin (ARANESP) injection - DIALYSIS  100 mcg Intravenous Q Mon-HD  . DULoxetine  30 mg Oral Daily  . feeding supplement  1 Container Oral Q24H  . feeding supplement (NEPRO CARB STEADY)  237 mL Oral Q24H  . insulin aspart  0-9  Units Subcutaneous TID AC & HS  . levothyroxine  75 mcg Oral QAC breakfast  . midodrine  10 mg Oral TID WC  . multivitamin  1 tablet Oral QHS  . sildenafil  80 mg Oral TID    Infusions: . sodium chloride irrigation      PRN Medications: sodium chloride, sodium chloride, albuterol, ALPRAZolam, alteplase, bisacodyl, feeding supplement (NEPRO CARB STEADY), heparin, heparin, lidocaine (PF), lidocaine-prilocaine, ondansetron (ZOFRAN) IV, oxyCODONE-acetaminophen **AND** oxyCODONE, pentafluoroprop-tetrafluoroeth, polyethylene glycol, sodium chloride, temazepam   Assessment:   1. Left Hip Fracture s/p IM nail on 08/26/14 2. Chronic systolic CHF- Nonischemic cardiomyopathy, on home milrinone 0.5 mcg/kg/min, decreased to 0.25 here with NSVT. Became hypotensive, transitioned to dobutamine 2.5 => now off dobutamine as well.  3. Acute on chronic renal failure - suspect post-op ATN.  CVVH => HD.  4.  Paroxysmal atrial fibrillation requiring DCCV 02/2014, 04/2014- on chronic amio and xarelto prior to admission. CHADS VASc Score- 5.  Remains in NSR.  5. Diabetes mellitus type 2 6. Depression/anxiety 7. OSA, complex- Needs Bipap at night but had difficulty after dental procedure.  8 Pulmonary HTN - Mixed pulmonary venous hypertension and PAH- on Revatio 80 mg tid 9. Dental extractions 08/2014 by Dr Enrique Sack 10. Obesity 11. Probable chronic respiratory failure - requiring home O2 with ambulation as of this admission 12. Hyponatremia   13. NSVT 14. Delirium- Improved.   15. Hemorrhagic shock with massive retroperitoneal hematoma 8/6 => off anticoagulation.  16.Stage II Pressure Ulcers- Heel Pressure ulcer  Plan/Discussion:    Tolerating HD.  Volume status improved, weight down.  Has fistula now.  Remains on midodrine.  Last hgb stable. Continue SCDs instead of low dose Lovenox after spontaneous RP hematoma. Will need to leave off anticoagulation for the time being despite CVA risk from atrial  fibrillation.  After a few weeks, will start coumadin with goal INR 2-2.5.  Runs of NSVT yesterday.  Concern that ICD not turned on after surgery => talked to [facility], it is functioning properly.  Continue amiodarone.    Says he needs break from [employer].  Will talk to wound care.   Disposition: SW following- to SNF or LTACH, can go today if there is a place.  Screening for [facility].     Loralie Champagne  8:10 AM  10/03/2014

## 2014-10-03 NOTE — Progress Notes (Signed)
He is comfortable in the chair Ambulating is going well and he denies hip pain  LLE: incisions are benign, small hematoma under proximal incision but no surrounding erythema or drainage.   Plan: I removed his stitches WBAT PT Dry dressing if needed for proximal incision but I would like it open to air, no dressing for distal incision  Dispo per primary  F/u with me in 3-4 wks.    Ninah Moccio D

## 2014-10-03 NOTE — Telephone Encounter (Signed)
LM for pt re appt, dpm °

## 2014-10-03 NOTE — Progress Notes (Signed)
UNA boots removed. Wound to top of bilateral feet and left heel cleansed and pink foam changed as ordered. Ortho tech notified that dressing needs to be changed.

## 2014-10-04 DIAGNOSIS — Z515 Encounter for palliative care: Secondary | ICD-10-CM

## 2014-10-04 LAB — RENAL FUNCTION PANEL
ALBUMIN: 3 g/dL — AB (ref 3.5–5.0)
ANION GAP: 12 (ref 5–15)
BUN: 27 mg/dL — AB (ref 6–20)
CO2: 23 mmol/L (ref 22–32)
Calcium: 8.3 mg/dL — ABNORMAL LOW (ref 8.9–10.3)
Chloride: 94 mmol/L — ABNORMAL LOW (ref 101–111)
Creatinine, Ser: 4.15 mg/dL — ABNORMAL HIGH (ref 0.61–1.24)
GFR calc Af Amer: 16 mL/min — ABNORMAL LOW (ref >60)
GFR calc non Af Amer: 14 mL/min — ABNORMAL LOW (ref >60)
GLUCOSE: 112 mg/dL — AB (ref 65–99)
PHOSPHORUS: 5.2 mg/dL — AB (ref 2.5–4.6)
POTASSIUM: 4 mmol/L (ref 3.5–5.1)
SODIUM: 129 mmol/L — AB (ref 135–145)

## 2014-10-04 LAB — GLUCOSE, CAPILLARY
GLUCOSE-CAPILLARY: 133 mg/dL — AB (ref 65–99)
Glucose-Capillary: 96 mg/dL (ref 65–99)
Glucose-Capillary: 105 mg/dL — ABNORMAL HIGH (ref 65–99)

## 2014-10-04 MED ORDER — ENSURE ENLIVE PO LIQD
237.0000 mL | ORAL | Status: DC
  Administered 2014-10-04 – 2014-10-11 (×6): 237 mL via ORAL

## 2014-10-04 NOTE — Progress Notes (Addendum)
Patient ID: AIKEN WITHEM, male   DOB: Jul 21, 1950, 64 y.o.   MRN: [id number]   Subjective:    [name] is a 64 y/o man with history of chronic systolic CHF (nonischemic cardiomyopathy) s/p St. 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 He is on chronic milrinone 0.5 mcg.  Prior to admit he was being considered for LVAD.   Discharged 7/16 after dental procedures. Also had RHC with elevate pulmonary pressures. Revatio was increased to 80 mg tid during that stay. Admitted with fall---> broken L  hip. 7/18 had IM Nail L femur.  7/23 started CVVHD, CVVH stopped 7/31.  8/6 began having severe right ab/flank pain and was found to have large retroperitoneal hematoma 8/7 with transfer to ICU. 8/18 VVS consulted for fistula.  Off anticoagulants using SCDs.   Fistula placed by VVS.   He is nauseated this morning, initially refused HD.     Objective:   Weight Range:  Vital Signs:   Temp:  [97.4 F (36.3 C)-98.4 F (36.9 C)] 98.4 F (36.9 C) (08/26 0611) Pulse Rate:  [66-74] 66 (08/26 0611) Resp:  [18] 18 (08/26 0611) BP: (98-110)/(58-59) 105/58 mmHg (08/26 0611) SpO2:  [94 %-100 %] 94 % (08/26 0611) Weight:  [220 lb 11.2 oz (100.109 kg)] 220 lb 11.2 oz (100.109 kg) (08/26 0611) Last BM Date: 10/03/14  Weight change: Filed Weights   10/02/14 0730 10/03/14 0459 10/04/14 0611  Weight: 217 lb 13 oz (98.8 kg) 218 lb (98.884 kg) 220 lb 11.2 oz (100.109 kg)    Intake/Output:   Intake/Output Summary (Last 24 hours) at 10/04/14 0949 Last data filed at 10/04/14 0918  Gross per 24 hour  Intake    820 ml  Output      0 ml  Net    820 ml     Physical Exam: General:  Getting HD. NAD.  HEENT: normal Neck: supple. JVP 10 cm. Tunneled HD catheter.  Carotids 2+ bilat; no bruits. No lymphadenopathy or thryomegaly appreciated. Cor: PMI nondisplaced. Regular rate & rhythm. No rubs, gallops or murmurs.  AICD under  left clavicle  Lungs: clear Abdomen: soft, nondistended. Large hematoma over right flank. + bowel sounds. Extremities: no cyanosis, clubbing, rash. R and LLE unna boots in place. 1+ edema to thighs.  Neuro: Alert/oriented Skin: Large Bullae on dorsum right foot  And RLE  Telemetry: NSR 70s  Labs: Basic Metabolic Panel:  Recent Labs Lab 10/01/14 0940 10/01/14 1701 10/02/14 0430 10/03/14 0628 10/04/14 0405  NA 134* 133* 130* 135 129*  K 4.2 4.5 4.3 3.8 4.0  CL 99* 98* 96* 97* 94*  CO2 25 26 25 29 23   GLUCOSE 103* 112* 101* 108* 112*  BUN 25* 27* 33* 19 27*  CREATININE 3.22* 3.40* 3.86* 3.18* 4.15*  CALCIUM 8.5* 8.7* 8.3* 8.5* 8.3*  MG  --   --  2.1 1.9  --   PHOS 4.4 5.2* 5.4* 4.4 5.2*    Liver Function Tests:  Recent Labs Lab 10/01/14 0940 10/01/14 1701 10/02/14 0430 10/03/14 0628 10/04/14 0405  ALBUMIN 2.9* 2.8* 2.7* 2.9* 3.0*    CBC:  Recent Labs Lab 09/28/14 1827 10/01/14 1701 10/02/14 0930 10/03/14 0628  WBC 6.2 5.3 5.1 5.2  NEUTROABS  --   --  2.9  --   HGB 10.9* 10.4* 9.9* 10.2*  HCT 33.6* 33.3* 31.5* 32.6*  MCV 97.4 98.5 99.1 99.7  PLT 112* 157 178 158  Cardiac Enzymes: No results for input(s): CKTOTAL, CKMB, CKMBINDEX, TROPONINI in the last 168 hours.  BNP: BNP (last 3 results)  Recent Labs  05/22/14 1029 05/27/14 1126 06/19/14 1030  BNP 935.2* 1627.8* 738.9*    ProBNP (last 3 results)  Recent Labs  10/09/13 0926 11/14/13 0926 01/14/14 1112  PROBNP 1561.0* 1261.0* 5769.0*      Other results:   Imaging: No results found.   Medications:     Scheduled Medications: . amiodarone  200 mg Oral Daily  . atorvastatin  40 mg Oral q1800  . bisacodyl  5 mg Oral QHS  . darbepoetin (ARANESP) injection - DIALYSIS  100 mcg Intravenous Q Mon-HD  . DULoxetine  30 mg Oral Daily  . feeding supplement  1 Container Oral Q24H  . insulin aspart  0-9 Units Subcutaneous TID AC & HS  . levothyroxine  75 mcg Oral QAC breakfast  .  midodrine  10 mg Oral TID WC  . multivitamin  1 tablet Oral QHS  . sildenafil  80 mg Oral TID    Infusions: . sodium chloride irrigation      PRN Medications: sodium chloride, sodium chloride, albuterol, ALPRAZolam, alteplase, bisacodyl, feeding supplement (NEPRO CARB STEADY), heparin, heparin, lidocaine (PF), lidocaine-prilocaine, ondansetron (ZOFRAN) IV, oxyCODONE-acetaminophen **AND** oxyCODONE, pentafluoroprop-tetrafluoroeth, polyethylene glycol, sodium chloride, temazepam   Assessment:   1. Left Hip Fracture s/p IM nail on 08/26/14 2. Chronic systolic CHF- Nonischemic cardiomyopathy, on home milrinone 0.5 mcg/kg/min, decreased to 0.25 here with NSVT. Became hypotensive, transitioned to dobutamine 2.5 => now off dobutamine as well.  3. Acute on chronic renal failure - suspect post-op ATN.  CVVH => HD.  4.  Paroxysmal atrial fibrillation requiring DCCV 02/2014, 04/2014- on chronic amio and xarelto prior to admission. CHADS VASc Score- 5.  Remains in NSR.  5. Diabetes mellitus type 2 6. Depression/anxiety 7. OSA, complex- Needs Bipap at night but had difficulty after dental procedure.  8 Pulmonary HTN - Mixed pulmonary venous hypertension and PAH- on Revatio 80 mg tid 9. Dental extractions 08/2014 by Dr Enrique Sack 10. Obesity 11. Probable chronic respiratory failure - requiring home O2 with ambulation as of this admission 12. Hyponatremia   13. NSVT 14. Delirium- Improved.   15. Hemorrhagic shock with massive retroperitoneal hematoma 8/6 => off anticoagulation.  16. Stage II Pressure Ulcers- Heel Pressure ulcer  Plan/Discussion:    Tolerating HD with midodrine.  Remains volume overloaded.  Did not have HD yesterday.  Think that his nausea this morning may be related to volume overload (looks worse today).  He initially refused HD due to nausea but says he will go down later today.   Last hgb stable. Continue SCDs instead of low dose Lovenox after spontaneous RP hematoma. Will need to  leave off anticoagulation for the time being despite CVA risk from atrial fibrillation.  After a few weeks, will start coumadin with goal INR 2-2.5.  Amiodarone for PAF, he remains in NSR.  No more NSVT.   Disposition: SW following- to SNF or LTACH, can go today if there is a place.  Screening for [facility], awaiting response.  He will need HD 4 days a week.    I think he understands poor prognosis but not ready for hospice care.  I have doubts about his ability to tolerate hemodialysis long-term but he wants to continue to try. I will have palliative care see him for symptom management and discussion of code status/goals of care.  Currently full code. I suggested that he should  at least be DNI.    Loralie Champagne  9:49 AM  10/04/2014

## 2014-10-04 NOTE — Progress Notes (Signed)
PT Cancellation Note  Patient Details Name: Larry [name] MRN: [id number] DOB: June 05, 1950   Cancelled Treatment:    Reason Eval/Treat Not Completed: Other (comment) (pt reports nausea and unable to participate at this time)   [name] 10/04/2014, 8:03 AM [name], [place]

## 2014-10-04 NOTE — Progress Notes (Signed)
Patient ID: Larry [name], male   DOB: 15-Jun-1950, 64 y.o.   MRN: [id number]  [facility] Progress Note    Subjective:   Refused HD this am due to nausea but feels better after second dose of zofran   Objective:   BP 105/58 mmHg  Pulse 66  Temp(Src) 98.4 F (36.9 C) (Oral)  Resp 18  Ht 5\' 10"  (1.778 m)  Wt 100.109 kg (220 lb 11.2 oz)  BMI 31.67 kg/m2  SpO2 94%  Intake/Output: I/O last 3 completed shifts: In: 800 [P.O.:800] Out: 0    Intake/Output this shift:    Weight change: 1.309 kg (2 lb 14.2 oz)  Physical Exam: Gen:WD WM in mild distress CVS:no rub Resp:decreased BS PRF:FMBWG Ext:+2 edema, R Cimino AVF +T/B Labs: BMET  Recent Labs Lab 09/29/14 0348 09/30/14 0318 10/01/14 0940 10/01/14 1701 10/02/14 0430 10/03/14 0628 10/04/14 0405  NA 132* 130* 134* 133* 130* 135 129*  K 3.9 3.8 4.2 4.5 4.3 3.8 4.0  CL 98* 95* 99* 98* 96* 97* 94*  CO2 25 25 25 26 25 29 23   GLUCOSE 120* 115* 103* 112* 101* 108* 112*  BUN 23* 34* 25* 27* 33* 19 27*  CREATININE 3.16* 3.79* 3.22* 3.40* 3.86* 3.18* 4.15*  ALBUMIN  --  2.7* 2.9* 2.8* 2.7* 2.9* 3.0*  CALCIUM 8.2* 8.1* 8.5* 8.7* 8.3* 8.5* 8.3*  PHOS  --  5.2* 4.4 5.2* 5.4* 4.4 5.2*   CBC  Recent Labs Lab 09/28/14 1827 10/01/14 1701 10/02/14 0930 10/03/14 0628  WBC 6.2 5.3 5.1 5.2  NEUTROABS  --   --  2.9  --   HGB 10.9* 10.4* 9.9* 10.2*  HCT 33.6* 33.3* 31.5* 32.6*  MCV 97.4 98.5 99.1 99.7  PLT 112* 157 178 158    @IMGRELPRIORS @ Medications:    . amiodarone  200 mg Oral Daily  . atorvastatin  40 mg Oral q1800  . bisacodyl  5 mg Oral QHS  . darbepoetin (ARANESP) injection - DIALYSIS  100 mcg Intravenous Q Mon-HD  . DULoxetine  30 mg Oral Daily  . feeding supplement  1 Container Oral Q24H  . insulin aspart  0-9 Units Subcutaneous TID AC & HS  . levothyroxine  75 mcg Oral QAC breakfast  . midodrine  10 mg Oral TID WC  . multivitamin  1 tablet Oral QHS  . 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  1. New ESRD -likely 2/2 to blood loss and ATN and cardiorenal syndrome. remains volume overloaded but improving with HD. S/p right radiocephalic AVF placement 6/65/99. Has multiple sessions of HD here, plan is HD 4xweek to help with volume removal. 2. Severe cardiomyopathy s/p ICD EF 25%. Not candidate for LVAD. CHF team on board.  3. Abd wall hematoma, improving - holding xarelto. hgb stable.  4. SP Lt hip fx 5. Anemia on aranesp, received IV iron 6. Sec HPTH not requiring Vit D PTH 90 on 8/8 7. Bullae on legs - 2/2 to volume overload -improving, wound care following.  8. Anxiety -especially at HD sessions - on xanax currently. Follows with outpatient psych.  Plan:  1. Plan for HD 4x week M,T,Th,Sat (M,W,F,Sat while here because of inpatient census).  3. Continue midodrine to support his BP and allow good volume removal.  4. Cont to work  with PT. Rehab pending. Do not think he will do well transferring back and forth SNF and HD unit. 5. He is currently not a candidate for outpatient dialysis as he would be too weak to go back and forth from SNF to outpt HD unit 4 days a week and recommend long-[facility] who can continue hemodialysis regularly in his bed while also receiving PT/OT. 6. Would also recommend palliative care consult to help set goals/limits of care as he is still a full code and his overall prognosis with end-stage CMP and ESRD is poor.  Larry [name] A 10/04/2014, 8:52 AM

## 2014-10-04 NOTE — Progress Notes (Signed)
Received call from [facility] with [facility], LT[facility] ( [facility] ) is not approved. CM talked to Seth Bake with [facility], they have no SNF beds available for the patient. They will approve SNF placement. Soc Worker working on [employer] placement. Mindi Slicker [facility] [phone]

## 2014-10-04 NOTE — Progress Notes (Signed)
Pt refused to go to dialysis. Pt still nauseated.

## 2014-10-04 NOTE — Progress Notes (Addendum)
Nutrition Follow-up  DOCUMENTATION CODES:   Obesity unspecified  INTERVENTION:  Provide Ensure Enlive po every 24 hours, each supplement provides 350 kcal and 20 grams of protein Boost Breeze po every 24 hours, each supplement provides 250 kcal and 9 grams of protein   NUTRITION DIAGNOSIS:   Increased nutrient needs related to wound healing as evidenced by estimated needs.  Ongoing  GOAL:   Patient will meet greater than or equal to 90% of their needs  Unmet  MONITOR:   PO intake, Labs, Weight trends, I & O's, Skin  REASON FOR ASSESSMENT:   Consult Assessment of nutrition requirement/status  ASSESSMENT:   64 y.o. Male who complains of left hip pain after sustaining a mechanical fall at home this morning. He presented to the ER for evaluation after being unable to ambulate without pain. Prior to the fall, the patient was abulatory without any assistive devices.   Pt underwent dental extraction yesterday; no evaluation for dentures until medically stable. Pt reports ongoing nausea but, states Zofran has been helping. He has only been eating 50% of meals and no longer consumes Nepro Shakes. He is drinking Boost Breeze once daily and is agreeable to trying additional supplements. His weight remains stable; per MD note pt remains volume overloaded.  Per nephrology, recommend palliative consult.   Labs: elevated phosphorus, elevated creatinine, low calcium, low sodium  Diet Order:  Diet renal/carb modified with fluid restriction Diet-HS Snack?: Nothing; Room service appropriate?: Yes; Fluid consistency:: Thin  Skin:  Wound (see comment) (stage II left heel, DTI left sacrum)  Last BM:  8/25  Height:   Ht Readings from Last 1 Encounters:  09/22/14 5\' 10"  (1.778 m)    Weight:   Wt Readings from Last 1 Encounters:  10/04/14 220 lb 11.2 oz (100.109 kg)    Ideal Body Weight:  75.4 kg  BMI:  Body mass index is 31.67 kg/(m^2).  Estimated Nutritional Needs:    Kcal:  2000-2200  Protein:  105-115 gm  Fluid:  2.0-2.2 L  EDUCATION NEEDS:   Education needs addressed  Scarlette Ar RD, LDN Inpatient Clinical Dietitian Pager: [phone] After Hours Pager: [phone]

## 2014-10-04 NOTE — Consult Note (Signed)
Consultation Note Date: 10/04/2014   Patient Name: Larry [name]  DOB: 1950-10-19  MRN: [id number]  Age / Sex: 64 y.o., male   PCP: Hulan Fess, MD Referring Physician: [name], MD  Reason for Consultation: Establishing goals of care and CODE STATUS discussions.   Palliative Care Assessment and Plan Summary of Established Goals of Care and Medical Treatment Preferences  Aadam Zhen is a 64 y.o. 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   Admitted after fall on 7/17 and had L broken hip with IM nail to L femur on 7/18. His hospital stay has been complicated by hypotension, hemorrhagic shock, and acute/chronic renal failure.   The patient is with a new diagnosis of end-stage renal disease. This is deemed secondary to blood loss versus cardiorenal syndrome. The patient has been started on hemodialysis. AV fistula placement has been done in this hospitalization. Prior to this acute hospitalization, the patient was being considered for LVAD placement. Patient also had a severe abdominal wall hematoma 3 weeks ago for which he was critically ill. His wife says that he required a total of 8 units of packed red blood cell transfusions.  Over the course of the last 24 hours, especially overnight, the patient has been nauseous. He has had dry heaves. Patient has had relief with Zofran. The patient doesn't necessarily participate much in his history taking or in the palliative  encounter. The patient's wife states that in a previous hospitalization they received a wishes document. She remembers that the patient said that he would want intubation, mechanical ventilation. However, he would not want to be on prolonged mechanical ventilation.  Discussed patient's multiple acute and chronic illnesses in great detail. Discussed patient's new end-stage renal disease, any sedation of hemodialysis. Discussed patient's underlying severe ischemic cardiomyopathy, ongoing issues with anxiety, volume overload. Discussed that it remains to be seen how feasible dialysis is going to be after this discharge.  The patient's wife becomes tearful and states that while she acknowledges that the patient is up against a lot of things, and has a high risk of not improving meaningfully, she states that she would not like to lose hope. She states that she is agreeable to the patient going to either skilled nursing facility or to long-term acute care. Eventually, she wishes to bring him home. She states that [facility] is in talks with [facility] about arranging for nursing care and a hospital bed.   Discussed scope of palliative care. Discussed about what would happen if the patient was not able to tolerate dialysis in the near future. Introduced Teacher, adult education of hospice and comfort measures. All questions answered. Will follow-up with the patient, his wife and hopefully his daughter. We'll continue to engage in his questions to help guide incision making.  Thank you for the consult.  Contacts/Participants in Discussion: Primary Decision Maker:   Patient, then his wife Peter Congo  HCPOA: yes  Wife Peter Congo  Code Status/Advance Care Planning:  Discussed extensively: The patient has an ICD. Discussed DO NOT INTUBATE in great detail. Discussed patient's multiple acute and chronic medical conditions. Discussed  appropriateness of establishing DO NOT INTUBATE. Patient was mostly asleep, did not participate. Discussed  with wife extensively. Also discussed that, if the patient continues to decline, at some point the mode of care will have to focus exclusively on comfort. At that point, patient will be appropriate for hospice services. At that time, patient will be appropriate for consideration for deactivation of ICD device. She wishes to discuss all of these things further with her daughter. Palliative will follow-up.   Symptom Management:   Continue when necessary Zofran for nausea. Consider scheduled and when necessary Zofran in case nausea continues.  Palliative Prophylaxis: Yes.  Additional Recommendations (Limitations, Scope, Preferences):  At this point in time, as per my discussions with the patient's wife and care manager, all efforts are being made to find an appropriate facility for the patient after this hospitalization.  Psycho-social/Spiritual:   Support System: Strong, wife and daughter.  Desire for further Chaplaincy support:no  Prognosis: Unable to determine  Discharge Planning:  Pending clinical course, long-term acute care versus skilled nursing facility arrangements.   Values: To continue dialysis, to attempt transfer to appropriate facility. Continuation of life-prolonging/life maintaining treatments. However, family will discuss further about DO NOT INTUBATE. Life limiting illness: 64 yo M with hxo f DM II, PAF, hypothyroidism, CAD, 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     Chief Complaint/History of Present Illness: Recent hip fracture, renal failure  Primary Diagnoses  Present on Admission:  . Automatic implantable cardioverter-defibrillator in situ . Persistent atrial fibrillation . Hip fracture . Left femoral shaft fracture  Palliative Review of Systems: Completed I have reviewed the medical record, interviewed the patient and family, and examined the patient. The following aspects are pertinent.  Past Medical  History  Diagnosis Date  . NICM (nonischemic cardiomyopathy)     a. NICM. EF 2011 25-30%. b. LHC 2009 nonobstructive CAD. c. h/o [employer] Jude ICD 2012. d. Echo 04/2013 - EF 25-30% with mildly decreased RV systolic function. e. Echo 01/2014: EF 20-25%, inf akinesis, RHC. e. Home milrinone started 4/16.  [name] CAD (coronary artery disease)     a. nonobstructive CAD by cath 2009 & 06/2014.  . Obesity   . CVA (cerebral vascular accident)     CVA 2007 without residual deficit  . Pituitary tumor      (nonfunctionging pituitary microadenoma) s/p gamma knife [facility] at [facility] 2009 with neuropathy and retinopathy  . Depression   . Erectile dysfunction   . DDD (degenerative disc disease)     Chronic low back pain.   . OSA (obstructive sleep apnea)     a. 4/16 sleep study with severe OSA  . Monoclonal gammopathy   . CKD (chronic kidney disease), stage III   . 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 Hypogonadotropic hypogonadism   . Polycythemia, secondary     improved/resolved  . Hypercholesterolemia   . Back pain     F/B [name]  . Monoclonal gammopathy of unknown significance     per [name]  . Neck pain     F/B [name]  . PAF (paroxysmal atrial fibrillation) 06/04/10    a. On Xarelto. b. DCCV 02/2014, 04/2014 to NSR.  [name] History of diverticulitis of colon   . Type II or unspecified type diabetes mellitus with neurological manifestations, uncontrolled   . Nonproliferative diabetic retinopathy NOS(362.03)   . Ventricular tachycardia 10/25/13    appropriate ICD shock, VT CL 230-240 msec  .  Confusion 02/01/2014  . Hypertension   . AICD (automatic cardioverter/defibrillator) present   . Anxiety   . Peripheral vascular disease     2007  . Hypothyroidism   . PTSD (post-traumatic stress disorder)     a. Anxiety/PTSD from ICD shock  . Lupus anticoagulant positive     a. No h/o DVT. Saw Dr Lindi Adie with hematology.   . Pulmonary hypertension     a. Mixed pulmonary venous hypertension  and PAH  . Chronic respiratory failure     a. As of 08/2014 requiring home O2 with ambulation due to desat.   Social History   Social History  . Marital Status: Married    Spouse Name: N/A  . Number of Children: 1  . Years of Education: N/A   Occupational History  . Disabled    Social History Main Topics  . Smoking status: Former Smoker    Types: Cigars    Quit date: 02/09/1983  . Smokeless tobacco: Never Used     Comment: 25 yrs ago  . Alcohol Use: No  . Drug Use: No  . Sexual Activity: No   Other Topics Concern  . None   Social History Narrative   Lives with wife.     Family History  Problem Relation Age of Onset  . Lung cancer    . Cancer Mother     skin  . Dementia Mother   . Depression Mother   . Heart disease Father 35  . Hypertension Father   . Lung cancer Father   . Obesity Father   . Obesity Sister   . Hypertension Sister   . Diabetes Brother   . Hypertension Brother   . Heart disease Brother 19    Died of stroke MI age 10  . Obesity Brother   . Lung cancer Paternal Uncle   . Diabetes Paternal Uncle     requiring leg amputations    Scheduled Meds: . amiodarone  200 mg Oral Daily  . atorvastatin  40 mg Oral q1800  . bisacodyl  5 mg Oral QHS  . darbepoetin (ARANESP) injection - DIALYSIS  100 mcg Intravenous Q Mon-HD  . DULoxetine  30 mg Oral Daily  . feeding supplement  1 Container Oral Q24H  . feeding supplement (ENSURE ENLIVE)  237 mL Oral Q24H  . insulin aspart  0-9 Units Subcutaneous TID AC & HS  . levothyroxine  75 mcg Oral QAC breakfast  . midodrine  10 mg Oral TID WC  . multivitamin  1 tablet Oral QHS  . sildenafil  80 mg Oral TID   Continuous Infusions: . sodium chloride irrigation     PRN Meds:.sodium chloride, sodium chloride, albuterol, ALPRAZolam, alteplase, bisacodyl, feeding supplement (NEPRO CARB STEADY), heparin, heparin, lidocaine (PF), lidocaine-prilocaine, ondansetron (ZOFRAN) IV, oxyCODONE-acetaminophen **AND** oxyCODONE,  pentafluoroprop-tetrafluoroeth, polyethylene glycol, sodium chloride, temazepam Medications Prior to Admission:  Prior to Admission medications   Medication Sig Start Date End Date Taking? Authorizing Provider  ALPRAZolam Duanne Moron) 0.5 MG tablet Take 0.5 mg by mouth See admin instructions. Take 1 tablet (0.5 mg) every night, may take an additional tablet two more times during the day as needed for anxiety   Yes Historical Provider, MD  amiodarone (PACERONE) 200 MG tablet Take 1 tablet (200 mg total) by mouth daily. 06/19/14  Yes [name], MD  atorvastatin (LIPITOR) 40 MG tablet TAKE 1 TABLET DAILY 03/18/14  Yes [name], MD  bisacodyl (DULCOLAX) 5 MG EC tablet Take 5 mg by  mouth at bedtime.   Yes Historical Provider, MD  chlorhexidine (PERIDEX) 0.12 % solution Perform mouth rinses twice daily after breakfast and at bedtime. Patient taking differently: Use as directed in the mouth or throat 2 (two) times daily. Perform mouth rinses twice daily after breakfast and at bedtime (swish and spit) 08/24/14  Yes Dayna N Dunn, PA-C  digoxin (LANOXIN) 0.125 MG tablet Take 0.5 tablets (0.0625 mg total) by mouth daily. 05/01/14  Yes [name], MD  DULoxetine (CYMBALTA) 30 MG capsule Take 60 mg by mouth 2 (two) times daily.    Yes Historical Provider, MD  GLUCAGON EMERGENCY 1 MG injection Inject 1 mg as directed once as needed (hypotension).  01/30/14  Yes Historical Provider, MD  hydrALAZINE (APRESOLINE) 100 MG tablet Take 1 tablet (100 mg total) by mouth 3 (three) times daily. 08/24/14  Yes Dayna N Dunn, PA-C  insulin glargine (LANTUS) 100 unit/mL SOPN Inject 30 Units into the skin at bedtime.   Yes Historical Provider, MD  insulin lispro (HUMALOG KWIKPEN) 100 UNIT/ML KiwkPen Inject 30 Units into the skin 3 (three) times daily before meals.   Yes Historical Provider, MD  levothyroxine (SYNTHROID, LEVOTHROID) 75 MCG tablet TAKE 1 TABLET ON AN EMPTY STOMACH 30 MINUTES BEFORE BREAKFAST 07/13/14  Yes  Historical Provider, MD  Melatonin 10 MG TABS Take 10 mg by mouth at bedtime as needed (sleep).    Yes Historical Provider, MD  metoprolol succinate (TOPROL-XL) 25 MG 24 hr tablet Take 1 tablet (25 mg total) by mouth 2 (two) times daily before a meal. 05/29/14  Yes Amy D Clegg, NP  milrinone (PRIMACOR) 20 MG/100ML SOLN infusion Inject 51.2 mcg/min into the vein continuous. (0.27mcg/kg/min) 08/24/14  Yes Dayna N Dunn, PA-C  Multiple Vitamin (MULITIVITAMIN WITH MINERALS) TABS Take 1 tablet by mouth daily.   Yes Historical Provider, MD  oxyCODONE-acetaminophen (PERCOCET) 10-325 MG per tablet Take 1 tablet by mouth every 4 (four) hours as needed for pain.  08/08/14  Yes Historical Provider, MD  Potassium Chloride ER 20 MEQ TBCR Take 20 mEq by mouth 2 (two) times daily. 08/15/14  Yes Dayna N Dunn, PA-C  rivaroxaban (XARELTO) 15 MG TABS tablet Take 1 tablet (15 mg total) by mouth daily with supper. 08/13/14  Yes [name], MD  sildenafil (REVATIO) 20 MG tablet Take 4 tablets (80 mg total) by mouth 3 (three) times daily. 08/24/14  Yes Dayna N Dunn, PA-C  spironolactone (ALDACTONE) 25 MG tablet TAKE 1 TABLET DAILY 08/14/14  Yes Jettie Booze, MD  torsemide (DEMADEX) 20 MG tablet Take 3 tablets (60 mg total) by mouth 2 (two) times daily. 08/24/14  Yes Dayna N Dunn, PA-C  zolpidem (AMBIEN) 5 MG tablet Take 1 tablet (5 mg total) by mouth at bedtime as needed for sleep. 06/04/14  Yes [name], MD  HYDROcodone-acetaminophen (NORCO) 5-325 MG per tablet Take 1-2 tablets by mouth every 6 (six) hours as needed for moderate pain. 08/26/14   Brittney Claiborne Billings, PA-C  oxyCODONE-acetaminophen (PERCOCET/ROXICET) 5-325 MG per tablet Take 1-2 tablets by mouth every 4-6 hours as needed for moderate-severe pain. Patient not taking: Reported on 08/25/2014 08/24/14   Dayna N Dunn, PA-C   Allergies  Allergen Reactions  . Bydureon [Exenatide] Other (See Comments)    sweating  . Losartan Potassium Other (See Comments)     insomnia   CBC:    Component Value Date/Time   WBC 5.2 10/03/2014 0628   HGB 10.2* 10/03/2014 0628   HCT 32.6*  10/03/2014 0628   PLT 158 10/03/2014 0628   MCV 99.7 10/03/2014 0628   NEUTROABS 2.9 10/02/2014 0930   LYMPHSABS 1.0 10/02/2014 0930   MONOABS 0.8 10/02/2014 0930   EOSABS 0.3 10/02/2014 0930   BASOSABS 0.1 10/02/2014 0930   Comprehensive Metabolic Panel:    Component Value Date/Time   NA 129* 10/04/2014 0405   K 4.0 10/04/2014 0405   CL 94* 10/04/2014 0405   CO2 23 10/04/2014 0405   BUN 27* 10/04/2014 0405   CREATININE 4.15* 10/04/2014 0405   GLUCOSE 112* 10/04/2014 0405   CALCIUM 8.3* 10/04/2014 0405   CALCIUM 7.9* 09/20/2014 1315   AST 31 09/07/2014 0445   ALT 12* 09/07/2014 0445   ALKPHOS 79 09/07/2014 0445   BILITOT 0.8 09/07/2014 0445   PROT 6.7 09/07/2014 0445   ALBUMIN 3.0* 10/04/2014 0405    Physical Exam: Vital Signs: BP 102/68 mmHg  Pulse 66  Temp(Src) 98.4 F (36.9 C) (Oral)  Resp 18  Ht 5\' 10"  (1.778 m)  Wt 100.109 kg (220 lb 11.2 oz)  BMI 31.67 kg/m2  SpO2 94% SpO2: SpO2: 94 % O2 Device: O2 Device: Not Delivered O2 Flow Rate: O2 Flow Rate (L/min): 2 L/min Intake/output summary:  Intake/Output Summary (Last 24 hours) at 10/04/14 1314 Last data filed at 10/04/14 1610  Gross per 24 hour  Intake    600 ml  Output      0 ml  Net    600 ml   LBM: Last BM Date: 10/03/14 Baseline Weight: Weight: 102.059 kg (225 lb) Most recent weight: Weight: 100.109 kg (220 lb 11.2 oz) (acale b)  Exam Findings:  Weak older than stated age appearing pale-appearing gentleman resting in bed. Asleep for most of the encounter. Does not interact. Wife is primary historian Clear anteriorly S1-S2 Trace edema Abdomen mild distention Patient has been confused off and on.                     When his lunch tray arrived, patient woke up. Stated he was not really hungry.  Palliative Performance Scale: 30% Additional Data Reviewed: Recent Labs      10/02/14  0930  10/03/14  0628  10/04/14  0405  WBC  5.1  5.2   --   HGB  9.9*  10.2*   --   PLT  178  158   --   NA   --   135  129*  BUN   --   19  27*  CREATININE   --   3.18*  4.15*     Time In: 1200 Time Out: 1255 Time Total: 55 min  Greater than 50%  of this time was spent counseling and coordinating care related to the above assessment and plan.  Signed by: Loistine Chance, MD [phone] Loistine Chance, MD  10/04/2014, 1:14 PM  Please contact Palliative Medicine Team phone at [phone] for questions and concerns.

## 2014-10-05 LAB — CBC
HCT: 32.6 % — ABNORMAL LOW (ref 39.0–52.0)
Hemoglobin: 10.5 g/dL — ABNORMAL LOW (ref 13.0–17.0)
MCH: 31.6 pg (ref 26.0–34.0)
MCHC: 32.2 g/dL (ref 30.0–36.0)
MCV: 98.2 fL (ref 78.0–100.0)
PLATELETS: ADEQUATE 10*3/uL (ref 150–400)
RBC: 3.32 MIL/uL — AB (ref 4.22–5.81)
RDW: 20.9 % — ABNORMAL HIGH (ref 11.5–15.5)
WBC: 6.6 10*3/uL (ref 4.0–10.5)

## 2014-10-05 LAB — GLUCOSE, CAPILLARY
GLUCOSE-CAPILLARY: 105 mg/dL — AB (ref 65–99)
GLUCOSE-CAPILLARY: 107 mg/dL — AB (ref 65–99)
GLUCOSE-CAPILLARY: 120 mg/dL — AB (ref 65–99)
Glucose-Capillary: 142 mg/dL — ABNORMAL HIGH (ref 65–99)
Glucose-Capillary: 131 mg/dL — ABNORMAL HIGH (ref 65–99)

## 2014-10-05 LAB — RENAL FUNCTION PANEL
Albumin: 2.8 g/dL — ABNORMAL LOW (ref 3.5–5.0)
Anion gap: 11 (ref 5–15)
BUN: 20 mg/dL (ref 6–20)
CALCIUM: 8.4 mg/dL — AB (ref 8.9–10.3)
CO2: 24 mmol/L (ref 22–32)
CREATININE: 3.71 mg/dL — AB (ref 0.61–1.24)
Chloride: 98 mmol/L — ABNORMAL LOW (ref 101–111)
GFR, EST AFRICAN AMERICAN: 19 mL/min — AB (ref >60)
GFR, EST NON AFRICAN AMERICAN: 16 mL/min — AB (ref >60)
Glucose, Bld: 106 mg/dL — ABNORMAL HIGH (ref 65–99)
Phosphorus: 4.4 mg/dL (ref 2.5–4.6)
Potassium: 3.6 mmol/L (ref 3.5–5.1)
SODIUM: 133 mmol/L — AB (ref 135–145)

## 2014-10-05 MED ORDER — LORAZEPAM 2 MG/ML IJ SOLN
0.5000 mg | Freq: Once | INTRAMUSCULAR | Status: DC
Start: 2014-10-05 — End: 2014-10-11

## 2014-10-05 NOTE — Progress Notes (Signed)
Called by CCMD informing me that pt is having SVT, HR=150's -160's, went to pt's room and found him unresponsive and pale, sternal rub done pt responded, vital signs taken, BP=141/71, O2 sat 99% on 2L [place], HR down to 90's.. Pt became very anxious and requesting to call his wife and wants her here, wife contacted and on her way here. Dr. Marlowe Sax notified and ordered ativan 0.5mg  IV if pt still anxious. Will continue to monitor.  Rhett Mutschler, Blanch Media, RN

## 2014-10-05 NOTE — Progress Notes (Signed)
Patient ID: Larry [name], male   DOB: Jun 29, 1950, 64 y.o.   MRN: [id number] S:fatigued but more awake today O:BP 110/63 mmHg  Pulse 70  Temp(Src) 98.7 F (37.1 C) (Oral)  Resp 18  Ht 5\' 10"  (1.778 m)  Wt 98.8 kg (217 lb 13 oz)  BMI 31.25 kg/m2  SpO2 99%  Intake/Output Summary (Last 24 hours) at 10/05/14 1225 Last data filed at 10/05/14 0932  Gross per 24 hour  Intake    600 ml  Output   3125 ml  Net  -2525 ml   Intake/Output: I/O last 3 completed shifts: In: 34 [P.O.:1320] Out: 7517 [Urine:150; Other:2900]  Intake/Output this shift:  Total I/O In: 240 [P.O.:240] Out: 75 [Urine:75] Weight change: 1.891 kg (4 lb 2.7 oz) GYF:VCBSW WM in NAD CVS:no rub Resp:decreased BS at bases HQP:RFFMB Ext:+edema, right AVF +T/B   Recent Labs Lab 09/30/14 0318 10/01/14 0940 10/01/14 1701 10/02/14 0430 10/03/14 0628 10/04/14 0405 10/05/14 0705  NA 130* 134* 133* 130* 135 129* 133*  K 3.8 4.2 4.5 4.3 3.8 4.0 3.6  CL 95* 99* 98* 96* 97* 94* 98*  CO2 25 25 26 25 29 23 24   GLUCOSE 115* 103* 112* 101* 108* 112* 106*  BUN 34* 25* 27* 33* 19 27* 20  CREATININE 3.79* 3.22* 3.40* 3.86* 3.18* 4.15* 3.71*  ALBUMIN 2.7* 2.9* 2.8* 2.7* 2.9* 3.0* 2.8*  CALCIUM 8.1* 8.5* 8.7* 8.3* 8.5* 8.3* 8.4*  PHOS 5.2* 4.4 5.2* 5.4* 4.4 5.2* 4.4   Liver Function Tests:  Recent Labs Lab 10/03/14 0628 10/04/14 0405 10/05/14 0705  ALBUMIN 2.9* 3.0* 2.8*   No results for input(s): LIPASE, AMYLASE in the last 168 hours. No results for input(s): AMMONIA in the last 168 hours. CBC:  Recent Labs Lab 09/28/14 1827 10/01/14 1701 10/02/14 0930 10/03/14 0628 10/05/14 0415  WBC 6.2 5.3 5.1 5.2 6.6  NEUTROABS  --   --  2.9  --   --   HGB 10.9* 10.4* 9.9* 10.2* 10.5*  HCT 33.6* 33.3* 31.5* 32.6* 32.6*  MCV 97.4 98.5 99.1 99.7 98.2  PLT 112* 157 178 158 PLATELET CLUMPS NOTED ON SMEAR, COUNT APPEARS ADEQUATE   Cardiac Enzymes: No results for input(s): CKTOTAL, CKMB, CKMBINDEX, TROPONINI in the  last 168 hours. CBG:  Recent Labs Lab 10/04/14 0609 10/04/14 1126 10/04/14 2127 10/05/14 0613 10/05/14 1119  GLUCAP 96 105* 133* 105* 107*    Iron Studies: No results for input(s): IRON, TIBC, TRANSFERRIN, FERRITIN in the last 72 hours. Studies/Results: No results found. [name] amiodarone  200 mg Oral Daily  . atorvastatin  40 mg Oral q1800  . bisacodyl  5 mg Oral QHS  . darbepoetin (ARANESP) injection - DIALYSIS  100 mcg Intravenous Q Mon-HD  . DULoxetine  30 mg Oral Daily  . feeding supplement  1 Container Oral Q24H  . feeding supplement (ENSURE ENLIVE)  237 mL Oral Q24H  . insulin aspart  0-9 Units Subcutaneous TID AC & HS  . levothyroxine  75 mcg Oral QAC breakfast  . midodrine  10 mg Oral TID WC  . multivitamin  1 tablet Oral QHS  . sildenafil  80 mg Oral TID    BMET    Component Value Date/Time   NA 133* 10/05/2014 0705   K 3.6 10/05/2014 0705   CL 98* 10/05/2014 0705   CO2 24 10/05/2014 0705   GLUCOSE 106* 10/05/2014 0705   BUN 20 10/05/2014 0705   CREATININE 3.71* 10/05/2014 0705   CALCIUM 8.4* 10/05/2014  8032   CALCIUM 7.9* 09/20/2014 1315   GFRNONAA 16* 10/05/2014 0705   GFRAA 19* 10/05/2014 0705   CBC    Component Value Date/Time   WBC 6.6 10/05/2014 0415   RBC 3.32* 10/05/2014 0415   HGB 10.5* 10/05/2014 0415   HCT 32.6* 10/05/2014 0415   PLT  10/05/2014 0415    PLATELET CLUMPS NOTED ON SMEAR, COUNT APPEARS ADEQUATE   MCV 98.2 10/05/2014 0415   MCH 31.6 10/05/2014 0415   MCHC 32.2 10/05/2014 0415   RDW 20.9* 10/05/2014 0415   LYMPHSABS 1.0 10/02/2014 0930   MONOABS 0.8 10/02/2014 0930   EOSABS 0.3 10/02/2014 0930   BASOSABS 0.1 10/02/2014 0930    64 yo M with hxo f DM II, PAF, hypothyroidism, CAD, 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  1. New ESRD -likely 2/2 to blood loss and ATN and cardiorenal syndrome. remains volume overloaded but improving with HD. S/p right radiocephalic AVF placement 03/01/46. Has multiple  sessions of HD here, plan is HD 4xweek to help with volume removal. 2. Severe cardiomyopathy s/p ICD EF 25%. Not candidate for LVAD. CHF team on board.  3. Abd wall hematoma, improving - holding xarelto. hgb stable.  4. SP Lt hip fx 5. Anemia on aranesp, received IV iron 6. Sec HPTH not requiring Vit D PTH 90 on 8/8 7. Bullae on legs - 2/2 to volume overload -improving, wound care following.  8. Anxiety -especially at HD sessions - on xanax currently. Follows with outpatient psych.  Plan:  1. Plan for HD 4x week M,T,Th,Sat and will need to be able to transfer to a recliner before he can be discharged (either from [facility] or [facility])  2. Continue midodrine to support his BP and allow good volume removal.  3. Cont to work with PT. Do not think he will do well transferring back and forth SNF and HD unit 4 days a week in his current state. 4. He is currently not a candidate for outpatient dialysis as he would be too weak to go back and forth from SNF to outpt HD unit 4 days a week and recommend [facility] to have HD there while also receiving PT/OT. 5. Appreciate palliative care consult to help set goals/limits of care.  He is now partial code however his overall prognosis with end-stage CMP and ESRD is poor.  [name]

## 2014-10-05 NOTE — Progress Notes (Addendum)
Subjective:  He is sitting up in a chair at the side of the bed and is awake today.  Evidently when palliative care came by yesterday he slept during most of the consultation and his wife did most of the listing.  He has made a decision to be a DO NOT INTUBATE after this discussion.  Less nauseated today.  Not currently short of breath.  Wants to know when he will get his wraps off.  Objective:  Vital Signs in the last 24 hours: BP 101/60 mmHg  Pulse 71  Temp(Src) 97.8 F (36.6 C) (Oral)  Resp 18  Ht 5\' 10"  (1.778 m)  Wt 98.8 kg (217 lb 13 oz)  BMI 31.25 kg/m2  SpO2 98%  Physical Exam: Elderly, chronically appearing white male in no acute distress Neck: JVP mildly elevated, no bruits, tunneled hemodialysis catheter Lungs:  Reduced breath sounds Cardiac:  Regular rhythm, normal S1 and S2, no S3 Abdomen:  Soft, nontender, no masses Extremities:  Both legs are wrapped, bullae present on the dorsum of his foot  Intake/Output from previous day: 08/26 0701 - 08/27 0700 In: 1080 [P.O.:1080] Out: 3050 [Urine:150] Weight Filed Weights   10/04/14 1521 10/04/14 1924 10/05/14 0610  Weight: 102 kg (224 lb 13.9 oz) 99 kg (218 lb 4.1 oz) 98.8 kg (217 lb 13 oz)    Lab Results: Basic Metabolic Panel:  Recent Labs  10/04/14 0405 10/05/14 0705  NA 129* 133*  K 4.0 3.6  CL 94* 98*  CO2 23 24  GLUCOSE 112* 106*  BUN 27* 20  CREATININE 4.15* 3.71*    CBC:  Recent Labs  10/03/14 0628 10/05/14 0415  WBC 5.2 6.6  HGB 10.2* 10.5*  HCT 32.6* 32.6*  MCV 99.7 98.2  PLT 158 PLATELET CLUMPS NOTED ON SMEAR, COUNT APPEARS ADEQUATE    BNP    Component Value Date/Time   BNP 738.9* 06/19/2014 1030    PROTIME: Lab Results  Component Value Date   INR 1.21 10/01/2014   INR 1.18 09/19/2014   INR 1.28 09/18/2014    Telemetry: Currently sinus rhythm  Assessment/Plan:  1.  Chronic and acute systolic heart failure-currently off of dobutamine and milrinone that he previously was  on-not felt to be a candidate for an LVAD 2.  Acute on chronic Renal failure currently on hemodialysis 3.  Chronic respiratory failure on home oxygen 4.  Obesity 5.  Retroperitoneal hematoma off of anticoagulation 6.  Venous insufficiency and pressure ulcers currently with legs wrapped   Recommendations:  Discussion with wife and patient.  They both state that he would not like to be intubated if he has a problem.  Asked about the defibrillator and I think there is some confusion in the wife's mind about what his long-term prognosis is.  They would wish to maintain defibrillator and hemodialysis and see how things go at this time.  Leave off of anticoagulation because of previous retroperitoneal hematoma and hold until deemed best to go back on that.  I will ask Dr. Haroldine Laws to see him again tomorrow to address some of the concerns of the wife has about his prognosis and plan going forward.   [name]  MD [facility]  10/05/2014, 9:44 AM

## 2014-10-06 LAB — RENAL FUNCTION PANEL
ALBUMIN: 2.7 g/dL — AB (ref 3.5–5.0)
ANION GAP: 9 (ref 5–15)
BUN: 27 mg/dL — ABNORMAL HIGH (ref 6–20)
CALCIUM: 8.6 mg/dL — AB (ref 8.9–10.3)
CO2: 26 mmol/L (ref 22–32)
CREATININE: 4.4 mg/dL — AB (ref 0.61–1.24)
Chloride: 97 mmol/L — ABNORMAL LOW (ref 101–111)
GFR calc non Af Amer: 13 mL/min — ABNORMAL LOW (ref >60)
GFR, EST AFRICAN AMERICAN: 15 mL/min — AB (ref >60)
GLUCOSE: 113 mg/dL — AB (ref 65–99)
PHOSPHORUS: 5 mg/dL — AB (ref 2.5–4.6)
Potassium: 3.9 mmol/L (ref 3.5–5.1)
SODIUM: 132 mmol/L — AB (ref 135–145)

## 2014-10-06 LAB — GLUCOSE, CAPILLARY
GLUCOSE-CAPILLARY: 107 mg/dL — AB (ref 65–99)
GLUCOSE-CAPILLARY: 94 mg/dL (ref 65–99)
GLUCOSE-CAPILLARY: 131 mg/dL — AB (ref 65–99)
Glucose-Capillary: 114 mg/dL — ABNORMAL HIGH (ref 65–99)

## 2014-10-06 MED ORDER — MIDODRINE HCL 5 MG PO TABS
ORAL_TABLET | ORAL | Status: AC
  Administered 2014-10-06: 10 mg via ORAL
  Filled 2014-10-06: qty 2

## 2014-10-06 MED ORDER — HEPARIN SODIUM (PORCINE) 1000 UNIT/ML DIALYSIS
20.0000 [IU]/kg | INTRAMUSCULAR | Status: DC | PRN
  Filled 2014-10-06: qty 2

## 2014-10-06 NOTE — Procedures (Addendum)
Patient was seen on dialysis and the procedure was supervised. BFR 400 Via RIJ TDC BP is 100/60.  Patient appears to be tolerating treatment well in recliner.  Unfortunately he was held over from yesterday due to busy HD schedule.  Should benefit from MTThSat schedule due to his CMP and low BP.  Awaiting placement at [facility].  Tachycardic episode yesterday noted, will ask cardiology for their input regarding treatment

## 2014-10-06 NOTE — Progress Notes (Signed)
Subjective:  Seen on dialysis.  Currently tolerating dialysis well.  Became anxious yesterday when it was delayed and had SVT.  Currently in sinus today and no c/o SOB.  Has moderate nausea.  Objective:  Vital Signs in the last 24 hours: BP 104/64 mmHg  Pulse 57  Temp(Src) 97.6 F (36.4 C) (Oral)  Resp 18  Ht 5\' 10"  (1.778 m)  Wt 99.338 kg (219 lb)  BMI 31.42 kg/m2  SpO2 100%  Physical Exam: Elderly, chronically appearing white male in no acute distress on dialysis Neck: JVP mildly elevated, no bruits, tunneled hemodialysis catheter Lungs:  Reduced breath sounds Cardiac:  Regular rhythm, normal S1 and S2, no S3 Extremities:  Both legs are wrapped, bullae present on the dorsum of his foot  Intake/Output from previous day: 08/27 0701 - 08/28 0700 In: 960 [P.O.:960] Out: 315 [Urine:315] Weight Filed Weights   10/04/14 1924 10/05/14 0610 10/06/14 0514  Weight: 99 kg (218 lb 4.1 oz) 98.8 kg (217 lb 13 oz) 99.338 kg (219 lb)    Lab Results: Basic Metabolic Panel:  Recent Labs  10/05/14 0705 10/06/14 0457  NA 133* 132*  K 3.6 3.9  CL 98* 97*  CO2 24 26  GLUCOSE 106* 113*  BUN 20 27*  CREATININE 3.71* 4.40*    CBC:  Recent Labs  10/05/14 0415  WBC 6.6  HGB 10.5*  HCT 32.6*  MCV 98.2  PLT PLATELET CLUMPS NOTED ON SMEAR, COUNT APPEARS ADEQUATE    BNP    Component Value Date/Time   BNP 738.9* 06/19/2014 1030   Telemetry: Currently sinus rhythm  Assessment/Plan:  1.  Chronic and acute systolic heart failure-currently off of dobutamine and milrinone that he previously was on-not felt to be a candidate for an LVAD 2.  Acute on chronic Renal failure currently on hemodialysis 3.  Chronic respiratory failure on home oxygen 4.  Obesity 5.  Retroperitoneal hematoma off of anticoagulation 6.  Venous insufficiency and pressure ulcers currently with legs wrapped   Recommendations:  Tolerating dialysis well.  Disposition is major problem at present as not good  candidate for outpatient dialysis.    [name]  MD [facility]  10/06/2014, 10:49 AM

## 2014-10-06 NOTE — Progress Notes (Signed)
Patient to HD VSS, patient remains anxious with at bedside.

## 2014-10-07 LAB — GLUCOSE, CAPILLARY
GLUCOSE-CAPILLARY: 122 mg/dL — AB (ref 65–99)
GLUCOSE-CAPILLARY: 122 mg/dL — AB (ref 65–99)
Glucose-Capillary: 131 mg/dL — ABNORMAL HIGH (ref 65–99)
Glucose-Capillary: 142 mg/dL — ABNORMAL HIGH (ref 65–99)

## 2014-10-07 LAB — RENAL FUNCTION PANEL
Albumin: 2.8 g/dL — ABNORMAL LOW (ref 3.5–5.0)
Anion gap: 9 (ref 5–15)
BUN: 22 mg/dL — AB (ref 6–20)
CHLORIDE: 97 mmol/L — AB (ref 101–111)
CO2: 26 mmol/L (ref 22–32)
Calcium: 8.4 mg/dL — ABNORMAL LOW (ref 8.9–10.3)
Creatinine, Ser: 4.06 mg/dL — ABNORMAL HIGH (ref 0.61–1.24)
GFR calc Af Amer: 17 mL/min — ABNORMAL LOW (ref >60)
GFR, EST NON AFRICAN AMERICAN: 14 mL/min — AB (ref >60)
Glucose, Bld: 116 mg/dL — ABNORMAL HIGH (ref 65–99)
POTASSIUM: 4.1 mmol/L (ref 3.5–5.1)
Phosphorus: 4.2 mg/dL (ref 2.5–4.6)
Sodium: 132 mmol/L — ABNORMAL LOW (ref 135–145)

## 2014-10-07 MED ORDER — ALPRAZOLAM 0.5 MG PO TABS
1.0000 mg | ORAL_TABLET | Freq: Four times a day (QID) | ORAL | Status: DC | PRN
  Administered 2014-10-07: 1 mg via ORAL
  Administered 2014-10-08: 0.5 mg via ORAL
  Administered 2014-10-08 – 2014-10-11 (×5): 1 mg via ORAL
  Filled 2014-10-07 (×9): qty 2

## 2014-10-07 MED ORDER — MIDODRINE HCL 5 MG PO TABS
10.0000 mg | ORAL_TABLET | Freq: Three times a day (TID) | ORAL | Status: DC
  Administered 2014-10-07 – 2014-10-11 (×12): 10 mg via ORAL
  Filled 2014-10-07 (×10): qty 2

## 2014-10-07 MED ORDER — AMIODARONE HCL 200 MG PO TABS
200.0000 mg | ORAL_TABLET | Freq: Two times a day (BID) | ORAL | Status: DC
  Administered 2014-10-07 – 2014-10-08 (×4): 200 mg via ORAL
  Filled 2014-10-07 (×5): qty 1

## 2014-10-07 NOTE — Progress Notes (Signed)
CSW Armed forces technical officer) spoke with pt and pt wife at bedside. Pt wife extremely tearful and upset during conversation. She expressed to [place] she feels she is being given multiple conflicting messages from different people on the medical team. CSW offered support and as much clarification as possible. Unfortunately, given pt medical condition there are just not some answers for clarifications CSW is able to provide right now. CSW did confirm that pt is not able to dc to [facility]. They do not accept dialysis patients. If plan is for continued dialysis, pt will need to be clipped for outpatient dialysis in order to dc to SNF. Pt wife confirmed they had previously accepted bed offer at [facility] notified pt and pt wife that given pt lengthy hospital stay, CSW will send out new/updated referral to confirm which facilities are able to offer. Unit CSW to follow up further tomorrow. Do not anticipate being able to provide pt and pt family with any further information but will provide support. Pt did ask CSW to follow up with dialysis unit regarding his wife being able to be with him during treatment. CSW informed pt and pt wife that CSW unsure of policy and likely if they have already been asked dialysis unit of this request that answer will not change. However, CSW did agree to follow up.   [place], [place]

## 2014-10-07 NOTE — Progress Notes (Signed)
Physical Therapy Treatment Patient Details Name: Larry [name] MRN: [id number] DOB: Feb 16, 1950 Today's Date: 10/07/2014    History of Present Illness Larry [name] is a 64 y.o. male who complains of left hip pain after sustaining a mechanical fall at home. Pt with left intertrochanteric fx s/p IM nail. PMHx NICM, CAD, DDD, back pain, CVA, obesity. 7/23 CRRT initiated, 7/31 CRRT discontinued. On regular hemodialysis.    PT Comments    Patient seated in recliner and eager to participate in PT today.Patient was able to ambulate and transfer as described below. Session was limited due to patients anxiety regarding HD schedule and uncertainty regarding HD treatment time. He was educated on continuing to perform exercises throughout the day and walking as tolerated with nursing staff or wife in the hallway. 4 of 6 goals were met and updated. Patient will benefit from continued PT to maintain and increase independence with ambulation as his medical status allows.   Follow Up Recommendations  SNF     Equipment Recommendations  None recommended by PT    Recommendations for Other Services OT consult     Precautions / Restrictions Precautions Precautions: Fall Restrictions Weight Bearing Restrictions: No    Mobility  Bed Mobility               General bed mobility comments: In chair upon PT arrival.  Transfers Overall transfer level: Modified independent Equipment used: None Transfers: Sit to/from Stand Sit to Stand: Modified independent (Device/Increase time) (increased time)         General transfer comment: Able to stand with no physical assistance or AD. Wife reports he regularly gets up to go to the bathroom without any assistance.  Ambulation/Gait Ambulation/Gait assistance: Supervision Ambulation Distance (Feet): 80 Feet Assistive device: Rolling walker (2 wheeled) Gait Pattern/deviations: Decreased stride length;Step-through pattern Gait velocity:  Decreased Gait velocity interpretation: <1.8 ft/sec, indicative of risk for recurrent falls General Gait Details: Limited distance today due to some fatigue, pain in heels, and imminent HD.   Stairs            Wheelchair Mobility    Modified Rankin (Stroke Patients Only)       Balance Overall balance assessment: Independent Sitting-balance support: Feet supported;No upper extremity supported Sitting balance-Leahy Scale: Good     Standing balance support: No upper extremity supported Standing balance-Leahy Scale: Good Standing balance comment: Patient able to don extra gown for ambulation with no assistance.                    Cognition Arousal/Alertness: Awake/alert Behavior During Therapy: WFL for tasks assessed/performed Overall Cognitive Status: Within Functional Limits for tasks assessed                      Exercises      General Comments        Pertinent Vitals/Pain Pain Assessment: 0-10 Pain Score: 3  Pain Location: Bil heels Pain Descriptors / Indicators: Constant;Sore Pain Intervention(s): Monitored during session    Home Living                      Prior Function            PT Goals (current goals can now be found in the care plan section) Acute Rehab PT Goals Patient Stated Goal: Go home PT Goal Formulation: With patient Time For Goal Achievement: 10/21/14 Potential to Achieve Goals: Good Progress towards PT goals: Goals met and updated -  see care plan    Frequency  Min 3X/week    PT Plan Current plan remains appropriate    Co-evaluation             End of Session Equipment Utilized During Treatment: Gait belt Activity Tolerance: Patient limited by fatigue Patient left: in bed;with call bell/phone within reach;with nursing/sitter in room;with family/visitor present     Time: 1206-1230 PT Time Calculation (min) (ACUTE ONLY): 24 min  Charges:  $Gait Training: 8-22 mins $Self Care/Home Management:  2022-10-06                    G CodesRoanna Epley, SPT [phone] 10/07/2014, 1:37 PM  I have read, reviewed and agree with student's note.   [place] [phone] (pager)

## 2014-10-07 NOTE — Progress Notes (Signed)
Per Seth Bake at [facility], patient was not approved for LTAC and patient has Out of Network benefits -  no SNF beds available - patient is not in network for SNF placement at their facility. Soc Worker to follow up and look at other facilities. Mindi Slicker [facility] [phone]

## 2014-10-07 NOTE — Progress Notes (Signed)
Patient ID: NICKLOS GAXIOLA, male   DOB: 1950-10-24, 64 y.o.   MRN: [id number] S: feels ok, denies any complaints.  O:BP 103/59 mmHg  Pulse 78  Temp(Src) 98.3 F (36.8 C) (Oral)  Resp 18  Ht 5\' 10"  (1.778 m)  Wt 215 lb 1.6 oz (97.569 kg)  BMI 30.86 kg/m2  SpO2 98%  Intake/Output Summary (Last 24 hours) at 10/07/14 0727 Last data filed at 10/07/14 9518  Gross per 24 hour  Intake    720 ml  Output   3065 ml  Net  -2345 ml   Intake/Output: I/O last 3 completed shifts: In: 17 [P.O.:960] Out: 8416 [Urine:265; Other:3000]  Intake/Output this shift:    Weight change: -3 lb 14.4 oz (-1.769 kg)    SAY:TKZSW WM in NAD, sitting in chair, eating breakfast.  CVS:no rub Resp:decreased BS at bases FUX:NATFT, NT,ND.  Ext:+edema, wrapped with dressing right AVF +T/B   Recent Labs Lab 10/01/14 1701 10/02/14 0430 10/03/14 0628 10/04/14 0405 10/05/14 0705 10/06/14 0457 10/07/14 0304  NA 133* 130* 135 129* 133* 132* 132*  K 4.5 4.3 3.8 4.0 3.6 3.9 4.1  CL 98* 96* 97* 94* 98* 97* 97*  CO2 26 25 29 23 24 26 26   GLUCOSE 112* 101* 108* 112* 106* 113* 116*  BUN 27* 33* 19 27* 20 27* 22*  CREATININE 3.40* 3.86* 3.18* 4.15* 3.71* 4.40* 4.06*  ALBUMIN 2.8* 2.7* 2.9* 3.0* 2.8* 2.7* 2.8*  CALCIUM 8.7* 8.3* 8.5* 8.3* 8.4* 8.6* 8.4*  PHOS 5.2* 5.4* 4.4 5.2* 4.4 5.0* 4.2   Liver Function Tests:  Recent Labs Lab 10/05/14 0705 10/06/14 0457 10/07/14 0304  ALBUMIN 2.8* 2.7* 2.8*   No results for input(s): LIPASE, AMYLASE in the last 168 hours. No results for input(s): AMMONIA in the last 168 hours. CBC:  Recent Labs Lab 10/01/14 1701 10/02/14 0930 10/03/14 0628 10/05/14 0415  WBC 5.3 5.1 5.2 6.6  NEUTROABS  --  2.9  --   --   HGB 10.4* 9.9* 10.2* 10.5*  HCT 33.3* 31.5* 32.6* 32.6*  MCV 98.5 99.1 99.7 98.2  PLT 157 178 158 PLATELET CLUMPS NOTED ON SMEAR, COUNT APPEARS ADEQUATE   Cardiac Enzymes: No results for input(s): CKTOTAL, CKMB, CKMBINDEX, TROPONINI in the last 168  hours. CBG:  Recent Labs Lab 10/06/14 0631 10/06/14 1155 10/06/14 1643 10/06/14 2101 10/07/14 0533  GLUCAP 107* 94 131* 114* 122*    Iron Studies: No results for input(s): IRON, TIBC, TRANSFERRIN, FERRITIN in the last 72 hours. Studies/Results: No results found. [name] amiodarone  200 mg Oral Daily  . atorvastatin  40 mg Oral q1800  . bisacodyl  5 mg Oral QHS  . darbepoetin (ARANESP) injection - DIALYSIS  100 mcg Intravenous Q Mon-HD  . DULoxetine  30 mg Oral Daily  . feeding supplement  1 Container Oral Q24H  . feeding supplement (ENSURE ENLIVE)  237 mL Oral Q24H  . insulin aspart  0-9 Units Subcutaneous TID AC & HS  . levothyroxine  75 mcg Oral QAC breakfast  . LORazepam  0.5 mg Intravenous Once  . midodrine  10 mg Oral TID WC  . multivitamin  1 tablet Oral QHS  . sildenafil  80 mg Oral TID    BMET    Component Value Date/Time   NA 132* 10/07/2014 0304   K 4.1 10/07/2014 0304   CL 97* 10/07/2014 0304   CO2 26 10/07/2014 0304   GLUCOSE 116* 10/07/2014 0304   BUN 22* 10/07/2014 0304   CREATININE  4.06* 10/07/2014 0304   CALCIUM 8.4* 10/07/2014 0304   CALCIUM 7.9* 09/20/2014 1315   GFRNONAA 14* 10/07/2014 0304   GFRAA 17* 10/07/2014 0304   CBC    Component Value Date/Time   WBC 6.6 10/05/2014 0415   RBC 3.32* 10/05/2014 0415   HGB 10.5* 10/05/2014 0415   HCT 32.6* 10/05/2014 0415   PLT  10/05/2014 0415    PLATELET CLUMPS NOTED ON SMEAR, COUNT APPEARS ADEQUATE   MCV 98.2 10/05/2014 0415   MCH 31.6 10/05/2014 0415   MCHC 32.2 10/05/2014 0415   RDW 20.9* 10/05/2014 0415   LYMPHSABS 1.0 10/02/2014 0930   MONOABS 0.8 10/02/2014 0930   EOSABS 0.3 10/02/2014 0930   BASOSABS 0.1 10/02/2014 0930    64 yo M with hxo f DM II, PAF, hypothyroidism, CAD, 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  1. New ESRD -likely 2/2 to blood loss and ATN and cardiorenal syndrome. remains volume overloaded but improving with HD. S/p right radiocephalic AVF  placement 0/07/62. Has multiple sessions of HD here, plan is HD 4xweek to help with volume removal. 2. Severe cardiomyopathy s/p ICD EF 25%. Not candidate for LVAD. CHF team on board.  3. PAF - on amio now (on increased amio to BID by cards for episode of SVT) 4. Abd wall hematoma, improving - holding xarelto. hgb stable.  5. SP Lt hip fx - needs rehab.  6. Anemia on aranesp, received IV iron 7. Sec HPTH not requiring Vit D PTH 90 on 8/8 8. Bullae on legs - 2/2 to volume overload -improving, wound care following.  9. Anxiety -especially at HD sessions - on xanax currently. Follows with outpatient psych.  Plan:  1. Plan for HD 4x week M,T,Th,Sat. We thought he would benefit from going to SNF at [facility] to get HD there as transporting to HD from other rehab may be difficult, unfortunately, insurance is not in network with [facility]. Awaiting alternate SNF placement. 2. Continue midodrine to support his BP and allow good volume removal.  3. Cont to work with PT - has missed several days.  4. Appreciate palliative care consult to help set goals/limits of care.  He is now partial code however his overall prognosis with end-stage CMP and ESRD is poor.  Jamaia Brum

## 2014-10-07 NOTE — Progress Notes (Signed)
Patient ID: Larry [name], male   DOB: [birth year], 64 y.o.   MRN: [id number]   Subjective:    Larry [name] is a 64 y/o man with history of chronic systolic CHF (nonischemic cardiomyopathy) s/p 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 He is on chronic milrinone 0.5 mcg.  Prior to admit he was being considered for LVAD.   Discharged 7/16 after dental procedures. Also had RHC with elevate pulmonary pressures. Revatio was increased to 80 mg tid during that stay. Admitted with fall---> broken L  hip. 7/18 had IM Nail L femur.  7/23 started CVVHD, CVVH stopped 7/31.  8/6 began having severe right ab/flank pain and was found to have large retroperitoneal hematoma 8/7 with transfer to ICU. 8/18 VVS consulted for fistula.  Off anticoagulants using SCDs.   Fistula placed by VVS.   SVT on 8/27.  Dialysis yesterday, weight down 4 lbs.  Doing fine this morning.      Objective:   Weight Range:  Vital Signs:   Temp:  [97.2 F (36.2 C)-98.3 F (36.8 C)] 98.3 F (36.8 C) (08/29 0526) Pulse Rate:  [57-78] 78 (08/29 0526) Resp:  [17-20] 18 (08/29 0526) BP: (84-112)/(45-64) 103/59 mmHg (08/29 0526) SpO2:  [97 %-100 %] 98 % (08/29 0526) Weight:  [215 lb 1.6 oz (97.569 kg)] 215 lb 1.6 oz (97.569 kg) (08/29 0526) Last BM Date: 10/03/14  Weight change: Filed Weights   10/05/14 0610 10/06/14 0514 10/07/14 0526  Weight: 217 lb 13 oz (98.8 kg) 219 lb (99.338 kg) 215 lb 1.6 oz (97.569 kg)    Intake/Output:   Intake/Output Summary (Last 24 hours) at 10/07/14 0811 Last data filed at 10/07/14 5284  Gross per 24 hour  Intake    720 ml  Output   3065 ml  Net  -2345 ml     Physical Exam: General:  NAD.  HEENT: normal Neck: supple. JVP 8 cm. Tunneled HD catheter.  Carotids 2+ bilat; no bruits. No lymphadenopathy or thryomegaly appreciated. Cor: PMI nondisplaced. Regular rate & rhythm. No rubs, gallops or  murmurs.  AICD under left clavicle  Lungs: clear Abdomen: soft, nondistended. Extremities: no cyanosis, clubbing, rash. R and LLE unna boots in place. 1+ edema to thighs.  Neuro: Alert/oriented  Telemetry: NSR 70s  Labs: Basic Metabolic Panel:  Recent Labs Lab 10/02/14 0430 10/03/14 0628 10/04/14 0405 10/05/14 0705 10/06/14 0457 10/07/14 0304  NA 130* 135 129* 133* 132* 132*  K 4.3 3.8 4.0 3.6 3.9 4.1  CL 96* 97* 94* 98* 97* 97*  CO2 25 29 23 24 26 26   GLUCOSE 101* 108* 112* 106* 113* 116*  BUN 33* 19 27* 20 27* 22*  CREATININE 3.86* 3.18* 4.15* 3.71* 4.40* 4.06*  CALCIUM 8.3* 8.5* 8.3* 8.4* 8.6* 8.4*  MG 2.1 1.9  --   --   --   --   PHOS 5.4* 4.4 5.2* 4.4 5.0* 4.2    Liver Function Tests:  Recent Labs Lab 10/03/14 0628 10/04/14 0405 10/05/14 0705 10/06/14 0457 10/07/14 0304  ALBUMIN 2.9* 3.0* 2.8* 2.7* 2.8*    CBC:  Recent Labs Lab 10/01/14 1701 10/02/14 0930 10/03/14 0628 10/05/14 0415  WBC 5.3 5.1 5.2 6.6  NEUTROABS  --  2.9  --   --   HGB 10.4* 9.9* 10.2* 10.5*  HCT 33.3* 31.5* 32.6* 32.6*  MCV 98.5 99.1 99.7 98.2  PLT 157 178 158 PLATELET CLUMPS NOTED ON  SMEAR, COUNT APPEARS ADEQUATE    Cardiac Enzymes: No results for input(s): CKTOTAL, CKMB, CKMBINDEX, TROPONINI in the last 168 hours.  BNP: BNP (last 3 results)  Recent Labs  05/22/14 1029 05/27/14 1126 06/19/14 1030  BNP 935.2* 1627.8* 738.9*    ProBNP (last 3 results)  Recent Labs  10/09/13 0926 11/14/13 0926 01/14/14 1112  PROBNP 1561.0* 1261.0* 5769.0*      Other results:   Imaging: No results found.   Medications:     Scheduled Medications: . amiodarone  200 mg Oral BID  . atorvastatin  40 mg Oral q1800  . bisacodyl  5 mg Oral QHS  . darbepoetin (ARANESP) injection - DIALYSIS  100 mcg Intravenous Q Mon-HD  . DULoxetine  30 mg Oral Daily  . feeding supplement  1 Container Oral Q24H  . feeding supplement (ENSURE ENLIVE)  237 mL Oral Q24H  . insulin aspart   0-9 Units Subcutaneous TID AC & HS  . levothyroxine  75 mcg Oral QAC breakfast  . LORazepam  0.5 mg Intravenous Once  . midodrine  10 mg Oral TID WC  . multivitamin  1 tablet Oral QHS  . sildenafil  80 mg Oral TID    Infusions: . sodium chloride irrigation      PRN Medications: albuterol, ALPRAZolam, bisacodyl, ondansetron (ZOFRAN) IV, oxyCODONE-acetaminophen **AND** oxyCODONE, polyethylene glycol, sodium chloride, temazepam   Assessment:   1. Left Hip Fracture s/p IM nail on 08/26/14 2. Chronic systolic CHF- Nonischemic cardiomyopathy, on home milrinone 0.5 mcg/kg/min, decreased to 0.25 here with NSVT. Became hypotensive, transitioned to dobutamine 2.5 => now off dobutamine as well.  3. Acute on chronic renal failure - suspect post-op ATN.  CVVH => HD.  4.  Paroxysmal atrial fibrillation requiring DCCV 02/2014, 04/2014- on chronic amio and xarelto prior to admission. CHADS VASc Score- 5.  Remains in NSR.  5. Diabetes mellitus type 2 6. Depression/anxiety 7. OSA, complex- Needs Bipap at night but had difficulty after dental procedure.  8 Pulmonary HTN - Mixed pulmonary venous hypertension and PAH- on Revatio 80 mg tid 9. Dental extractions 08/2014 by Dr Enrique Sack 10. Obesity 11. Probable chronic respiratory failure - requiring home O2 with ambulation as of this admission 12. Hyponatremia   13. NSVT 14. Delirium- Improved.   15. Hemorrhagic shock with massive retroperitoneal hematoma 8/6 => off anticoagulation.  16. Stage II Pressure Ulcers- Heel Pressure ulcer 17. SVT 18. Anxiety  Plan/Discussion:    Tolerating HD with midodrine.  Volume status looks pretty good today (had HD yesterday).  Timing of HD per nephrology.   Last hgb stable. Continue SCDs instead of low dose Lovenox after spontaneous RP hematoma. Will need to leave off anticoagulation for the time being despite CVA risk from atrial fibrillation.  After a few weeks, will start coumadin with goal INR 2-2.5.  Amiodarone  for PAF, he remains in NSR.  No more NSVT.  He did have a run of rapid SVT on 8/27.  Will give him amiodarone 200 mg bid for a few days then back to daily.   Disposition: SW following- possibly to [facility] when bed is available.  He will need HD 4 days a week.  He can go whenever a place is available for him.   I think he understands poor prognosis but not ready for hospice care.  I have doubts about his ability to tolerate hemodialysis long-term but he wants to continue to try. Palliative care following, currently partial code.    Larry [name] [employer]  8:10 AM  10/07/2014

## 2014-10-07 NOTE — Progress Notes (Signed)
Pt's BP is in lower side most of the time during the day, midodrine is continue, HD is cancelled for today and is scheduled for tomorrow. No any sign of SOB and distress noted, pain meds provided, will continue to monitor the patient.

## 2014-10-07 NOTE — Progress Notes (Signed)
[name] was visited on 8/22, 8/25 and today to review treatment options for ESRD. He and his wife has discussed with the physicians that PD is not an acceptable option due to past abdominal surgeries resulting in probable scarring. They are both very interested in HHD and all prescriptions for HHD were dicussed. Information and DVD's were provided for continual review. My card was also provided for additional questions and assistance.

## 2014-10-07 NOTE — Progress Notes (Signed)
Orthopedic Tech Progress Note Patient Details:  Larry [name] 1951-01-21 [id number]  Ortho Devices Type of Ortho Device: Louretta Parma boot Ortho Device/Splint Location: (B) LE Ortho Device/Splint Interventions: Ordered, Application   [name] 10/07/2014, 9:33 PM

## 2014-10-08 LAB — CBC
HCT: 33.6 % — ABNORMAL LOW (ref 39.0–52.0)
Hemoglobin: 10.3 g/dL — ABNORMAL LOW (ref 13.0–17.0)
MCH: 30.8 pg (ref 26.0–34.0)
MCHC: 30.7 g/dL (ref 30.0–36.0)
MCV: 100.6 fL — AB (ref 78.0–100.0)
PLATELETS: 195 10*3/uL (ref 150–400)
RBC: 3.34 MIL/uL — AB (ref 4.22–5.81)
RDW: 20.9 % — AB (ref 11.5–15.5)
WBC: 6.2 10*3/uL (ref 4.0–10.5)

## 2014-10-08 LAB — RENAL FUNCTION PANEL
ALBUMIN: 3 g/dL — AB (ref 3.5–5.0)
Anion gap: 13 (ref 5–15)
BUN: 33 mg/dL — AB (ref 6–20)
CALCIUM: 8.7 mg/dL — AB (ref 8.9–10.3)
CHLORIDE: 95 mmol/L — AB (ref 101–111)
CO2: 21 mmol/L — ABNORMAL LOW (ref 22–32)
CREATININE: 4.86 mg/dL — AB (ref 0.61–1.24)
GFR, EST AFRICAN AMERICAN: 13 mL/min — AB (ref >60)
GFR, EST NON AFRICAN AMERICAN: 12 mL/min — AB (ref >60)
Glucose, Bld: 111 mg/dL — ABNORMAL HIGH (ref 65–99)
Phosphorus: 5.1 mg/dL — ABNORMAL HIGH (ref 2.5–4.6)
Potassium: 4.5 mmol/L (ref 3.5–5.1)
SODIUM: 129 mmol/L — AB (ref 135–145)

## 2014-10-08 LAB — GLUCOSE, CAPILLARY
GLUCOSE-CAPILLARY: 114 mg/dL — AB (ref 65–99)
Glucose-Capillary: 118 mg/dL — ABNORMAL HIGH (ref 65–99)
Glucose-Capillary: 119 mg/dL — ABNORMAL HIGH (ref 65–99)
Glucose-Capillary: 118 mg/dL — ABNORMAL HIGH (ref 65–99)
Glucose-Capillary: 107 mg/dL — ABNORMAL HIGH (ref 65–99)

## 2014-10-08 MED ORDER — DARBEPOETIN ALFA 100 MCG/0.5ML IJ SOSY
PREFILLED_SYRINGE | INTRAMUSCULAR | Status: AC
  Filled 2014-10-08: qty 0.5

## 2014-10-08 MED ORDER — MIDODRINE HCL 5 MG PO TABS
ORAL_TABLET | ORAL | Status: AC
  Filled 2014-10-08: qty 2

## 2014-10-08 MED ORDER — HEPARIN SODIUM (PORCINE) 1000 UNIT/ML DIALYSIS
20.0000 [IU]/kg | INTRAMUSCULAR | Status: DC | PRN
  Filled 2014-10-08: qty 2

## 2014-10-08 MED ORDER — DARBEPOETIN ALFA 100 MCG/0.5ML IJ SOSY
100.0000 ug | PREFILLED_SYRINGE | Freq: Once | INTRAMUSCULAR | Status: AC
  Administered 2014-10-08: 100 ug via INTRAVENOUS
  Filled 2014-10-08: qty 0.5

## 2014-10-08 NOTE — Procedures (Signed)
I was present at this dialysis session. I have reviewed the session itself and made appropriate changes.   Tolerating HD in chair.  Awaiting SNF.  Likely to [facility] 4x/w MWFS.  Goal UF 4L today over 4h.  Hb and K ok.    Pearson Grippe  MD 10/08/2014, 8:45 AM

## 2014-10-08 NOTE — Progress Notes (Signed)
CSW notified this afternoon that patient is medically stable for d/c.  After multiple extensive calls throughout the day to check on SNF bed offers- the follow 2 choices are being decided tonight by patient and his wife.  1:  [facility]- has a private room but patient/wife will be responsible to pay transportation fees at from the SNF to dialysis center 4 times a week at or 2: [facility] room- no dialysis transportation fees. Both facilities can accept patient with dialysis 4 times as week. CSW also spoke with Bethena Roys- Dialysis RN Coordinator who indicated that patient has been clipped and has a chair at the [facility] 2nd shift on M-W-F- and Saturday.  Patient is scheduled for a treatment again tomorrow prior to d/c.  Above discussed with patient and wife.  They will discuss tonight and will let CSW know in the morning.  MD: please dictate d/c summary and sign Fl2- front of chart.    Patient was somewhat upset with CSW as he wants reassurance that his wife can sleep at the nursing center. CSW explained that this is not normal for the nursing centers and they do not encourage family to stay overnight.  Patient related that he sometimes "feels panicky" and can only be calmed by his wife's voice and/or presence.  CSW will check with admissions at both SNF's in the morning but it is very doubtful they will allow her to spend each night there. Patient responded "If she can't stay at night- I will just go home."  CSW provided support and encouraged patient to re-consider the benefits of short term SNF. Discussed his concerns with Mrs. Moga and she will talk to him tonight.   [name]. [name], [place]

## 2014-10-08 NOTE — Progress Notes (Signed)
Patient ID: Larry [name], male   DOB: Apr 09, 1950, 64 y.o.   MRN: [id number]   Subjective:    Larry [name] is a 64 y/o man with history of chronic systolic CHF (nonischemic cardiomyopathy) s/p 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 He is on chronic milrinone 0.5 mcg.  Prior to admit he was being considered for LVAD.   Discharged 7/16 after dental procedures. Also had RHC with elevate pulmonary pressures. Revatio was increased to 80 mg tid during that stay. Admitted with fall---> broken L  hip. 7/18 had IM Nail L femur.  7/23 started CVVHD, CVVH stopped 7/31.  8/6 began having severe right ab/flank pain and was found to have large retroperitoneal hematoma 8/7 with transfer to ICU. 8/18 VVS consulted for fistula.  Off anticoagulants using SCDs.   Fistula placed by VVS.   SVT on 8/27.  Seen at HD today, tolerating fine, BP stable.    Objective:   Weight Range:  Vital Signs:   Temp:  [97.5 F (36.4 C)-97.8 F (36.6 C)] 97.6 F (36.4 C) (08/30 0748) Pulse Rate:  [66-75] 66 (08/30 1000) Resp:  [18] 18 (08/30 0748) BP: (88-114)/(48-67) 100/55 mmHg (08/30 1000) SpO2:  [96 %-98 %] 98 % (08/30 0626) Weight:  [216 lb 11.2 oz (98.294 kg)-216 lb 14.9 oz (98.4 kg)] 216 lb 14.9 oz (98.4 kg) (08/30 0748) Last BM Date: 10/03/14  Weight change: Filed Weights   10/07/14 0526 10/08/14 0626 10/08/14 0748  Weight: 215 lb 1.6 oz (97.569 kg) 216 lb 11.2 oz (98.294 kg) 216 lb 14.9 oz (98.4 kg)    Intake/Output:   Intake/Output Summary (Last 24 hours) at 10/08/14 1027 Last data filed at 10/08/14 0744  Gross per 24 hour  Intake    600 ml  Output    150 ml  Net    450 ml     Physical Exam: General:  NAD.  HEENT: normal Neck: supple. JVP 7 cm. Tunneled HD catheter.  Carotids 2+ bilat; no bruits. No lymphadenopathy or thryomegaly appreciated. Cor: PMI nondisplaced. Regular rate & rhythm. No rubs, gallops  or murmurs.  AICD under left clavicle  Lungs: clear Abdomen: soft, nondistended. Extremities: no cyanosis, clubbing, rash. R and LLE unna boots in place. 1+ edema to thighs.  Neuro: Alert/oriented  Telemetry: NSR 70s  Labs: Basic Metabolic Panel:  Recent Labs Lab 10/02/14 0430 10/03/14 0628 10/04/14 0405 10/05/14 0705 10/06/14 0457 10/07/14 0304 10/08/14 0522  NA 130* 135 129* 133* 132* 132* 129*  K 4.3 3.8 4.0 3.6 3.9 4.1 4.5  CL 96* 97* 94* 98* 97* 97* 95*  CO2 25 29 23 24 26 26  21*  GLUCOSE 101* 108* 112* 106* 113* 116* 111*  BUN 33* 19 27* 20 27* 22* 33*  CREATININE 3.86* 3.18* 4.15* 3.71* 4.40* 4.06* 4.86*  CALCIUM 8.3* 8.5* 8.3* 8.4* 8.6* 8.4* 8.7*  MG 2.1 1.9  --   --   --   --   --   PHOS 5.4* 4.4 5.2* 4.4 5.0* 4.2 5.1*    Liver Function Tests:  Recent Labs Lab 10/04/14 0405 10/05/14 0705 10/06/14 0457 10/07/14 0304 10/08/14 0522  ALBUMIN 3.0* 2.8* 2.7* 2.8* 3.0*    CBC:  Recent Labs Lab 10/01/14 1701 10/02/14 0930 10/03/14 0628 10/05/14 0415 10/08/14 0522  WBC 5.3 5.1 5.2 6.6 6.2  NEUTROABS  --  2.9  --   --   --   HGB  10.4* 9.9* 10.2* 10.5* 10.3*  HCT 33.3* 31.5* 32.6* 32.6* 33.6*  MCV 98.5 99.1 99.7 98.2 100.6*  PLT 157 178 158 PLATELET CLUMPS NOTED ON SMEAR, COUNT APPEARS ADEQUATE 195    Cardiac Enzymes: No results for input(s): CKTOTAL, CKMB, CKMBINDEX, TROPONINI in the last 168 hours.  BNP: BNP (last 3 results)  Recent Labs  05/22/14 1029 05/27/14 1126 06/19/14 1030  BNP 935.2* 1627.8* 738.9*    ProBNP (last 3 results)  Recent Labs  10/09/13 0926 11/14/13 0926 01/14/14 1112  PROBNP 1561.0* 1261.0* 5769.0*      Other results:   Imaging: No results found.   Medications:     Scheduled Medications: . amiodarone  200 mg Oral BID  . atorvastatin  40 mg Oral q1800  . bisacodyl  5 mg Oral QHS  . darbepoetin (ARANESP) injection - DIALYSIS  100 mcg Intravenous Q Mon-HD  . darbepoetin (ARANESP) injection -  DIALYSIS  100 mcg Intravenous Once  . DULoxetine  30 mg Oral Daily  . feeding supplement  1 Container Oral Q24H  . feeding supplement (ENSURE ENLIVE)  237 mL Oral Q24H  . insulin aspart  0-9 Units Subcutaneous TID AC & HS  . levothyroxine  75 mcg Oral QAC breakfast  . LORazepam  0.5 mg Intravenous Once  . midodrine  10 mg Oral TID  . multivitamin  1 tablet Oral QHS  . sildenafil  80 mg Oral TID    Infusions: . sodium chloride irrigation      PRN Medications: albuterol, ALPRAZolam, bisacodyl, heparin, ondansetron (ZOFRAN) IV, oxyCODONE-acetaminophen **AND** oxyCODONE, polyethylene glycol, sodium chloride, temazepam   Assessment:   1. Left Hip Fracture s/p IM nail on 08/26/14 2. Chronic systolic CHF- Nonischemic cardiomyopathy, on home milrinone 0.5 mcg/kg/min, decreased to 0.25 here with NSVT. Became hypotensive, transitioned to dobutamine 2.5 => now off dobutamine as well.  3. Acute on chronic renal failure - suspect post-op ATN.  CVVH => HD.  4.  Paroxysmal atrial fibrillation requiring DCCV 02/2014, 04/2014- on chronic amio and xarelto prior to admission. CHADS VASc Score- 5.  Remains in NSR.  5. Diabetes mellitus type 2 6. Depression/anxiety 7. OSA, complex- Needs Bipap at night but had difficulty after dental procedure.  8 Pulmonary HTN - Mixed pulmonary venous hypertension and PAH- on Revatio 80 mg tid 9. Dental extractions 08/2014 by Dr Enrique Sack 10. Obesity 11. Probable chronic respiratory failure - requiring home O2 with ambulation as of this admission 12. Hyponatremia   13. NSVT 14. Delirium- Improved.   15. Hemorrhagic shock with massive retroperitoneal hematoma 8/6 => off anticoagulation.  16. Stage II Pressure Ulcers- Heel Pressure ulcer 17. SVT 18. Anxiety  Plan/Discussion:    Tolerating HD with midodrine.  Volume status looks pretty good today (getting HD currently).  Timing of HD per nephrology.  Down the road, he may be able to do PD.   Last hgb stable.  Continue SCDs instead of low dose Lovenox after spontaneous RP hematoma. Will need to leave off anticoagulation for the time being despite CVA risk from atrial fibrillation.  After about 2 more weeks, will start coumadin with goal INR 2-2.5.  Amiodarone for PAF, he remains in NSR.  No more NSVT.  He did have a run of rapid SVT on 8/27.  Will give him amiodarone 200 mg bid for a few days then back to daily (tomorrow).   Disposition: He can go to SNF with outpatient HD 4 x /week per Dr Joelyn Oms.  Ready for discharge when  bed is a available.    I think he understands poor prognosis but not ready for hospice care.  I have doubts about his ability to tolerate hemodialysis long-term but he wants to continue to try. Palliative care following, currently partial code.    Loralie Champagne  10:27 AM  10/08/2014

## 2014-10-08 NOTE — Progress Notes (Signed)
Patient ID: AUM CAGGIANO, male   DOB: 09-09-1950, 64 y.o.   MRN: [id number]  S: had low BP yesterday, did not receive HD.  SNF still pending. Patient feels ok, denies any complaints.    O:BP 101/61 mmHg  Pulse 73  Temp(Src) 97.8 F (36.6 C) (Oral)  Resp 18  Ht 5\' 10"  (1.778 m)  Wt 216 lb 11.2 oz (98.294 kg)  BMI 31.09 kg/m2  SpO2 98%  Intake/Output Summary (Last 24 hours) at 10/08/14 0753 Last data filed at 10/08/14 0744  Gross per 24 hour  Intake    840 ml  Output    150 ml  Net    690 ml   Intake/Output: I/O last 3 completed shifts: In: 1080 [P.O.:1080] Out: 165 [Urine:165]  Intake/Output this shift:  Total I/O In: -  Out: 50 [Urine:50] Weight change: 1 lb 9.6 oz (0.726 kg)    WIO:XBDZH WM in NAD, sitting in chair in HD unit. CVS:no rub Resp:decreased BS at bases GDJ:MEQAS, NT,ND.  Ext:+edema, wrapped with dressing , right AVF +T/B   Recent Labs Lab 10/02/14 0430 10/03/14 0628 10/04/14 0405 10/05/14 0705 10/06/14 0457 10/07/14 0304 10/08/14 0522  NA 130* 135 129* 133* 132* 132* 129*  K 4.3 3.8 4.0 3.6 3.9 4.1 4.5  CL 96* 97* 94* 98* 97* 97* 95*  CO2 25 29 23 24 26 26  21*  GLUCOSE 101* 108* 112* 106* 113* 116* 111*  BUN 33* 19 27* 20 27* 22* 33*  CREATININE 3.86* 3.18* 4.15* 3.71* 4.40* 4.06* 4.86*  ALBUMIN 2.7* 2.9* 3.0* 2.8* 2.7* 2.8* 3.0*  CALCIUM 8.3* 8.5* 8.3* 8.4* 8.6* 8.4* 8.7*  PHOS 5.4* 4.4 5.2* 4.4 5.0* 4.2 5.1*   Liver Function Tests:  Recent Labs Lab 10/06/14 0457 10/07/14 0304 10/08/14 0522  ALBUMIN 2.7* 2.8* 3.0*   No results for input(s): LIPASE, AMYLASE in the last 168 hours. No results for input(s): AMMONIA in the last 168 hours. CBC:  Recent Labs Lab 10/01/14 1701 10/02/14 0930 10/03/14 0628 10/05/14 0415 10/08/14 0522  WBC 5.3 5.1 5.2 6.6 6.2  NEUTROABS  --  2.9  --   --   --   HGB 10.4* 9.9* 10.2* 10.5* 10.3*  HCT 33.3* 31.5* 32.6* 32.6* 33.6*  MCV 98.5 99.1 99.7 98.2 100.6*  PLT 157 178 158 PLATELET CLUMPS  NOTED ON SMEAR, COUNT APPEARS ADEQUATE 195   Cardiac Enzymes: No results for input(s): CKTOTAL, CKMB, CKMBINDEX, TROPONINI in the last 168 hours. CBG:  Recent Labs Lab 10/07/14 1123 10/07/14 1641 10/07/14 2101 10/08/14 0626 10/08/14 0734  GLUCAP 131* 142* 122* 114* 118*    Iron Studies: No results for input(s): IRON, TIBC, TRANSFERRIN, FERRITIN in the last 72 hours. Studies/Results: No results found. [name] amiodarone  200 mg Oral BID  . atorvastatin  40 mg Oral q1800  . bisacodyl  5 mg Oral QHS  . darbepoetin (ARANESP) injection - DIALYSIS  100 mcg Intravenous Q Mon-HD  . DULoxetine  30 mg Oral Daily  . feeding supplement  1 Container Oral Q24H  . feeding supplement (ENSURE ENLIVE)  237 mL Oral Q24H  . insulin aspart  0-9 Units Subcutaneous TID AC & HS  . levothyroxine  75 mcg Oral QAC breakfast  . LORazepam  0.5 mg Intravenous Once  . midodrine  10 mg Oral TID  . multivitamin  1 tablet Oral QHS  . sildenafil  80 mg Oral TID    BMET    Component Value Date/Time   NA 129* 10/08/2014  0522   K 4.5 10/08/2014 0522   CL 95* 10/08/2014 0522   CO2 21* 10/08/2014 0522   GLUCOSE 111* 10/08/2014 0522   BUN 33* 10/08/2014 0522   CREATININE 4.86* 10/08/2014 0522   CALCIUM 8.7* 10/08/2014 0522   CALCIUM 7.9* 09/20/2014 1315   GFRNONAA 12* 10/08/2014 0522   GFRAA 13* 10/08/2014 0522   CBC    Component Value Date/Time   WBC 6.2 10/08/2014 0522   RBC 3.34* 10/08/2014 0522   HGB 10.3* 10/08/2014 0522   HCT 33.6* 10/08/2014 0522   PLT 195 10/08/2014 0522   MCV 100.6* 10/08/2014 0522   MCH 30.8 10/08/2014 0522   MCHC 30.7 10/08/2014 0522   RDW 20.9* 10/08/2014 0522   LYMPHSABS 1.0 10/02/2014 0930   MONOABS 0.8 10/02/2014 0930   EOSABS 0.3 10/02/2014 0930   BASOSABS 0.1 10/02/2014 0930    64 yo M with hxo f DM II, PAF, hypothyroidism, CAD, 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  1. New ESRD -likely 2/2 to blood loss and ATN and cardiorenal syndrome.  remains volume overloaded but improving with HD. S/p right radiocephalic AVF placement 2/50/03. Has multiple sessions of HD here and plan is HD 4xweek outpatient. Challenge is his low BP which limits UF.  2. Severe cardiomyopathy s/p ICD EF 25%. Not candidate for LVAD. CHF team on board.  3. PAF - on amio now (on increased amio to BID by cards for episode of SVT) 4. Abd wall hematoma, improving - holding xarelto. hgb stable.  5. SP Lt hip fx - needs rehab.  6. Anemia on aranesp, received IV iron 7. Sec HPTH not requiring Vit D PTH 90 on 8/8 8. Bullae on legs - 2/2 to volume overload -improving, wound care following.  9. Anxiety -especially at HD sessions - on xanax currently. Follows with outpatient psych. 10. GOC: appreciate palliative care involvement, he is partial code now due to his overall poor prognosis from end stage cardiomyopathy and ESRD.  Plan:  1. HD cancelled yesterday due to low BP. Will try HD today. Plan for HD 4x week M,T,Th,Sat at [facility]. SNF pending. Awaiting outpatient HD setup.  2. Continue midodrine to support his BP and allow good volume removal.  3. Cont to work with PT - has missed several days.   Alwilda Gilland

## 2014-10-08 NOTE — Progress Notes (Addendum)
1130 HD RN Alen Blew called regarding pt requesting Xanax ASAP. I pulled Xanax 1 mg from pyxis and went to HD to administer to pt myself.

## 2014-10-08 NOTE — Progress Notes (Signed)
Occupational Therapy Treatment Patient Details Name: Larry [name] MRN: [id number] DOB: [birth year] Today's Date: 10/08/2014    History of present illness Larry [name] is a 64 y.o. male who complains of left hip pain after sustaining a mechanical fall at home. Pt with left intertrochanteric fx s/p IM nail. PMHx NICM, CAD, DDD, back pain, CVA, obesity. 7/23 CRRT initiated, 7/31 CRRT discontinued. On regular hemodialysis.   OT comments  Patient making good progress. Pt supervision for mobility using RW and requires increased assistance with LB ADLs secondary to wrapping> BLEs. Pt reports that he will have assistance from wife post acute d/c. Depending on progress, pt may be able to discharge > home with [facility] therapies.   Pt with increased anxiety regarding HD. Would like patient to have access to resources for this.    Follow Up Recommendations  SNF;Supervision/Assistance - 24 hour (depending on progress and support at home, pt may be appropriate for home with HHOT. )    Equipment Recommendations  3 in 1 bedside comode;Hospital bed    Recommendations for Other Services  None at this time   Precautions / Restrictions Precautions Precautions: Fall Restrictions Weight Bearing Restrictions: No LLE Weight Bearing: Weight bearing as tolerated    Mobility Bed Mobility General bed mobility comments: In chair upon OT arrival.  Transfers Overall transfer level: Needs assistance Equipment used: Rolling walker (2 wheeled) Transfers: Sit to/from Stand Sit to Stand: Supervision         General transfer comment: Supervision for safety.     Balance Overall balance assessment: Needs assistance Sitting-balance support: No upper extremity supported;Feet supported Sitting balance-Leahy Scale: Good     Standing balance support: Bilateral upper extremity supported;During functional activity Standing balance-Leahy Scale: Good   ADL Overall ADL's : Needs assistance/impaired Eating/Feeding:  Set up;Sitting   Grooming: Standing;Supervision/safety   Upper Body Bathing: Sitting;Set up   Lower Body Bathing: Moderate assistance;Sit to/from stand Lower Body Bathing Details (indicate cue type and reason): partly due to wrappings>BLEs Upper Body Dressing : Sitting;Set up   Lower Body Dressing: Moderate assistance;Sit to/from stand Lower Body Dressing Details (indicate cue type and reason): partly due to wrappings>BLEs Toilet Transfer: Supervision/safety;RW General ADL Comments: Educated pt on safety with RW, importance of completing tasks himself to help improve his overall strength/endurance, importance of completing BUE strengthening exercises, and tips for increased anxiety.      Cognition   Behavior During Therapy: WFL for tasks assessed/performed Overall Cognitive Status: Within Functional Limits for tasks assessed                 Pertinent Vitals/ Pain       Pain Assessment: No/denies pain   Frequency Min 2X/week     Progress Toward Goals  OT Goals(current goals can now befound in the care plan section)  Progress towards OT goals: Progressing toward goals     Plan Discharge plan remains appropriate    End of Session Equipment Utilized During Treatment: Rolling walker   Activity Tolerance Patient tolerated treatment well   Patient Left in chair;with call bell/phone within reach;with family/visitor present    Time: 5852-7782 OT Time Calculation (min): 28 min  Charges: OT General Charges $OT Visit: 1 Procedure OT Treatments $Self Care/Home Management : 23-37 mins  Jenita Rayfield , MS, OTR/L, CLT Pager: [phone]  10/08/2014, 3:30 PM

## 2014-10-08 NOTE — Progress Notes (Signed)
10/08/2014 3:24 PM Update on Hemodialysis Outpatient Note; Patient will have 4x's/wk dialysis at [facility]. Schedule will be Mon-Wed-Fri-Sat 2nd shift. The center can begin treatment on Friday Sept 2nd at 10:30 AM.Thank you. Gordy Savers

## 2014-10-09 ENCOUNTER — Other Ambulatory Visit: Payer: Self-pay

## 2014-10-09 LAB — RENAL FUNCTION PANEL
ANION GAP: 10 (ref 5–15)
Albumin: 3 g/dL — ABNORMAL LOW (ref 3.5–5.0)
BUN: 20 mg/dL (ref 6–20)
CALCIUM: 8.6 mg/dL — AB (ref 8.9–10.3)
CHLORIDE: 95 mmol/L — AB (ref 101–111)
CO2: 26 mmol/L (ref 22–32)
Creatinine, Ser: 3.79 mg/dL — ABNORMAL HIGH (ref 0.61–1.24)
GFR calc Af Amer: 18 mL/min — ABNORMAL LOW (ref >60)
GFR calc non Af Amer: 16 mL/min — ABNORMAL LOW (ref >60)
GLUCOSE: 118 mg/dL — AB (ref 65–99)
Phosphorus: 4.2 mg/dL (ref 2.5–4.6)
Potassium: 4 mmol/L (ref 3.5–5.1)
SODIUM: 131 mmol/L — AB (ref 135–145)

## 2014-10-09 LAB — CBC
HEMATOCRIT: 33.6 % — AB (ref 39.0–52.0)
HEMOGLOBIN: 10.2 g/dL — AB (ref 13.0–17.0)
MCH: 30.7 pg (ref 26.0–34.0)
MCHC: 30.4 g/dL (ref 30.0–36.0)
MCV: 101.2 fL — AB (ref 78.0–100.0)
PLATELETS: 169 10*3/uL (ref 150–400)
RBC: 3.32 MIL/uL — AB (ref 4.22–5.81)
RDW: 21.2 % — ABNORMAL HIGH (ref 11.5–15.5)
WBC: 5.7 10*3/uL (ref 4.0–10.5)

## 2014-10-09 LAB — MAGNESIUM: Magnesium: 2 mg/dL (ref 1.7–2.4)

## 2014-10-09 LAB — GLUCOSE, CAPILLARY
GLUCOSE-CAPILLARY: 106 mg/dL — AB (ref 65–99)
GLUCOSE-CAPILLARY: 124 mg/dL — AB (ref 65–99)
Glucose-Capillary: 116 mg/dL — ABNORMAL HIGH (ref 65–99)
Glucose-Capillary: 125 mg/dL — ABNORMAL HIGH (ref 65–99)

## 2014-10-09 MED ORDER — AMIODARONE HCL 200 MG PO TABS: 200.0000 mg | ORAL_TABLET | Freq: Two times a day (BID) | ORAL | Status: DC

## 2014-10-09 MED ORDER — AMIODARONE HCL IN DEXTROSE 360-4.14 MG/200ML-% IV SOLN
30.0000 mg/h | INTRAVENOUS | Status: DC
  Administered 2014-10-09 – 2014-10-11 (×4): 30 mg/h via INTRAVENOUS
  Filled 2014-10-09 (×9): qty 200

## 2014-10-09 MED ORDER — ALPRAZOLAM 0.5 MG PO TABS
ORAL_TABLET | ORAL | Status: AC
  Administered 2014-10-09: 1 mg via ORAL
  Filled 2014-10-09: qty 2

## 2014-10-09 MED ORDER — MAGNESIUM SULFATE 2 GM/50ML IV SOLN
2.0000 g | Freq: Once | INTRAVENOUS | Status: AC
  Administered 2014-10-09: 2 g via INTRAVENOUS
  Filled 2014-10-09: qty 50

## 2014-10-09 MED ORDER — AMIODARONE HCL 200 MG PO TABS
400.0000 mg | ORAL_TABLET | Freq: Two times a day (BID) | ORAL | Status: DC
  Administered 2014-10-09: 400 mg via ORAL
  Filled 2014-10-09: qty 2

## 2014-10-09 MED ORDER — AMIODARONE HCL IN DEXTROSE 360-4.14 MG/200ML-% IV SOLN
60.0000 mg/h | INTRAVENOUS | Status: AC
  Administered 2014-10-09: 60 mg/h via INTRAVENOUS
  Filled 2014-10-09: qty 200

## 2014-10-09 NOTE — Significant Event (Signed)
Rapid Response Event Note  Overview:  Called to see patient with VT episode in HD Time Called: 1022 Arrival Time: 1025 Event Type: Cardiac  Initial Focused Assessment:  On my arrival patient awake and alert - in recliner chair with HD being rinsed back.  Monitor shows vpacing with few PVC's noted.  Patient is warm and dry - pale - denies CP or SOB - denies feeling any "shock" from defibrillator - bil BS equal and clear - on O2 at 2 liter nasal cannula - asking for ice - strips reviewed - wide complex tachycardia noted - possible pacing out of rhythm.  RN Joaquim Lai reports she was alerted to [facility] and when she checked patient he had brief episode of "slumping" to the right - but quickly recovered.  BP 121/80 HR 78 RR 20 O2 sat 100% with 2 liter nasal cannula.     Interventions:  Stat 12 lead EKG done - Oda Kilts PA with CHF team alerted - will see patient back in [place].  HD team notified by Towana Badger.   Spoke with CCMD monitoring to save strips - unable to print in HD at this time.  Patient transferred back to 3E23 without incident.  Dr. Missy Sabins and Darrick Grinder NP at bedside.  Handoff to [employer].  Wife updated by myself and MD.  Patient stable - resting in bed.    Event Summary: Name of Physician Notified: Dr. Missy Sabins via [place] at 1025    at    Outcome: Stayed in room and stabalized  Event End Time: [address]

## 2014-10-09 NOTE — Progress Notes (Signed)
CSW spoke to patient's wife this morning multiple times re: SNF disposition. She toured [facility] and while she liked the facility- she was concerned that the private room was "too small" and that due to patient's claustrophobia- he would not be happy. Thus, the family has decided to accept a bed at [place] and his brother-in-law will transport him to his dialysis appointments 4 times a week via car. Above confirmed with [facility] and arrangements were completed for wife to go sign admit papers at the facility. CSW received a call from Mrs. Larry [name] (and confirmed with Dialysis unit) that patient had a loss of consciousness and V-tach.  DC has been delayed to SNF. Notified [facility] Admissions; she cannot hold private room but anticipates another private room tomorrow.  Will monitor for potential d/c date of stability.  Larry [name]. Larry [name], [place]

## 2014-10-09 NOTE — Procedures (Signed)
I was present at this dialysis session. I have reviewed the session itself and made appropriate changes.   In chair, tolereating well.  TDC.  UF goal 4L.  Need to cont to challenge EDW as outpt.  Standing weight as starting point post HD today.  Pearson Grippe  MD 10/09/2014, 8:58 AM

## 2014-10-09 NOTE — Progress Notes (Signed)
Patient ID: Larry [name], male   DOB: [birth year], 64 y.o.   MRN: [id number]   Subjective:    Larry [name] is a 64 y/o man with history of chronic systolic CHF (nonischemic cardiomyopathy) s/p St. 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 He is on chronic milrinone 0.5 mcg.  Prior to admit he was being considered for LVAD.   Discharged 7/16 after dental procedures. Also had RHC with elevate pulmonary pressures. Revatio was increased to 80 mg tid during that stay. Admitted with fall---> broken L  hip. 7/18 had IM Nail L femur.  7/23 started CVVHD, CVVH stopped 7/31.  8/6 began having severe right ab/flank pain and was found to have large retroperitoneal hematoma 8/7 with transfer to ICU. 8/27 SVT   Today called by nursing staff for 3 minutes of VT noted during HD. Loss of consciousness for a few seconds but quickly back his baseline.  HD stopped. He was returned to [facility] response evaluated. Wife at bed side.   Complaining of fatigue.      Objective:   Weight Range:  Vital Signs:   Temp:  [96.6 F (35.9 C)-98.4 F (36.9 C)] 96.6 F (35.9 C) (08/31 1025) Pulse Rate:  [57-77] 68 (08/31 1207) Resp:  [18] 18 (08/31 1207) BP: (85-121)/(48-95) 94/48 mmHg (08/31 1207) SpO2:  [94 %-100 %] 100 % (08/31 1207) Weight:  [213 lb 12.8 oz (96.979 kg)-214 lb 15.2 oz (97.5 kg)] 214 lb 15.2 oz (97.5 kg) (08/31 0711) Last BM Date: 10/08/14  Weight change: Filed Weights   10/08/14 1210 10/09/14 0524 10/09/14 0711  Weight: 210 lb 12.2 oz (95.6 kg) 213 lb 12.8 oz (96.979 kg) 214 lb 15.2 oz (97.5 kg)    Intake/Output:   Intake/Output Summary (Last 24 hours) at 10/09/14 1221 Last data filed at 10/09/14 1025  Gross per 24 hour  Intake    240 ml  Output   2420 ml  Net  -2180 ml     Physical Exam: General:  NAD. In bed  HEENT: normal Neck: supple. JVP 7 cm. Tunneled HD catheter.  Carotids 2+ bilat; no  bruits. No lymphadenopathy or thryomegaly appreciated. Cor: PMI nondisplaced. Regular rate & rhythm. No rubs, gallops or murmurs.  AICD under left clavicle  Lungs: clear Abdomen: soft, nondistended. Extremities: no cyanosis, clubbing, rash. R and LLE unna boots in place.  Neuro: Alert/oriented  Telemetry: NSR 70s  Labs: Basic Metabolic Panel:  Recent Labs Lab 10/03/14 0628  10/05/14 0705 10/06/14 0457 10/07/14 0304 10/08/14 0522 10/09/14 0353  NA 135  < > 133* 132* 132* 129* 131*  K 3.8  < > 3.6 3.9 4.1 4.5 4.0  CL 97*  < > 98* 97* 97* 95* 95*  CO2 29  < > 24 26 26  21* 26  GLUCOSE 108*  < > 106* 113* 116* 111* 118*  BUN 19  < > 20 27* 22* 33* 20  CREATININE 3.18*  < > 3.71* 4.40* 4.06* 4.86* 3.79*  CALCIUM 8.5*  < > 8.4* 8.6* 8.4* 8.7* 8.6*  MG 1.9  --   --   --   --   --  2.0  PHOS 4.4  < > 4.4 5.0* 4.2 5.1* 4.2  < > = values in this interval not displayed.  Liver Function Tests:  Recent Labs Lab 10/05/14 0705 10/06/14 0457 10/07/14 0304 10/08/14 0522 10/09/14 0353  ALBUMIN 2.8* 2.7* 2.8* 3.0* 3.0*  CBC:  Recent Labs Lab 10/03/14 0628 10/05/14 0415 10/08/14 0522 10/09/14 0749  WBC 5.2 6.6 6.2 5.7  HGB 10.2* 10.5* 10.3* 10.2*  HCT 32.6* 32.6* 33.6* 33.6*  MCV 99.7 98.2 100.6* 101.2*  PLT 158 PLATELET CLUMPS NOTED ON SMEAR, COUNT APPEARS ADEQUATE 195 169    Cardiac Enzymes: No results for input(s): CKTOTAL, CKMB, CKMBINDEX, TROPONINI in the last 168 hours.  BNP: BNP (last 3 results)  Recent Labs  05/22/14 1029 05/27/14 1126 06/19/14 1030  BNP 935.2* 1627.8* 738.9*    ProBNP (last 3 results)  Recent Labs  11/14/13 0926 01/14/14 1112  PROBNP 1261.0* 5769.0*      Other results:   Imaging: No results found.   Medications:     Scheduled Medications: . [START ON 10/16/2014] amiodarone  200 mg Oral BID  . amiodarone  400 mg Oral BID  . atorvastatin  40 mg Oral q1800  . bisacodyl  5 mg Oral QHS  . darbepoetin (ARANESP) injection  - DIALYSIS  100 mcg Intravenous Q Mon-HD  . DULoxetine  30 mg Oral Daily  . feeding supplement  1 Container Oral Q24H  . feeding supplement (ENSURE ENLIVE)  237 mL Oral Q24H  . insulin aspart  0-9 Units Subcutaneous TID AC & HS  . levothyroxine  75 mcg Oral QAC breakfast  . LORazepam  0.5 mg Intravenous Once  . midodrine  10 mg Oral TID  . multivitamin  1 tablet Oral QHS  . sildenafil  80 mg Oral TID    Infusions: . sodium chloride irrigation      PRN Medications: albuterol, ALPRAZolam, bisacodyl, ondansetron (ZOFRAN) IV, oxyCODONE-acetaminophen **AND** oxyCODONE, polyethylene glycol, sodium chloride, temazepam   Assessment:   1. Left Hip Fracture s/p IM nail on 08/26/14 2. Chronic systolic CHF- Nonischemic cardiomyopathy, on home milrinone 0.5 mcg/kg/min, decreased to 0.25 here with NSVT. Became hypotensive, transitioned to dobutamine 2.5 => now off dobutamine as well.  3. Acute on chronic renal failure - suspect post-op ATN.  CVVH => HD.  4.  Paroxysmal atrial fibrillation requiring DCCV 02/2014, 04/2014- on chronic amio and xarelto prior to admission. CHADS VASc Score- 5.  Remains in NSR.  5. Diabetes mellitus type 2 6. Depression/anxiety 7. OSA, complex- Needs Bipap at night but had difficulty after dental procedure.  8 Pulmonary HTN - Mixed pulmonary venous hypertension and PAH- on Revatio 80 mg tid 9. Dental extractions 08/2014 by Dr Enrique Sack 10. Obesity 11. Probable chronic respiratory failure - requiring home O2 with ambulation as of this admission 12. Hyponatremia   13. NSVT 14. Delirium- Improved.   15. Hemorrhagic shock with massive retroperitoneal hematoma 8/6 => off anticoagulation.  16. Stage II Pressure Ulcers- Heel Pressure ulcer 17. SVT 18. Anxiety  Plan/Discussion:    VT today during HD. Increase amio to 400 mg twice daily x 1 week, then 200 mg twice a day. St Jude interrogate. Mag 2.0 Give 2 grams magnesium. K 4.0 . Need to keep K 4.0 and > 2.0 .   Last hgb  stable. Continue SCDs instead of low dose Lovenox after spontaneous RP hematoma. Will need to leave off anticoagulation for the time being despite CVA risk from atrial fibrillation.  After about 2 more weeks, will start coumadin with goal INR 2-2.5.  Disposition: He can go to SNF with outpatient HD 4 x /week per Dr Joelyn Oms.  Ready for discharge when bed is a available.    He understands poor prognosis but not ready for hospice care.  I  have doubts about his ability to tolerate hemodialysis long-term but he wants to continue to try. Palliative care following, currently partial code.    CLEGG,AMY  NP-C  12:21 PM  10/09/2014   Patient seen and examined with Darrick Grinder, NP. We discussed all aspects of the encounter. I agree with the assessment and plan as stated above.   He had 3.5 minute episode of sustained VT with loss of consciousness appears to be terminated by ATP. Now resolved but has multiple episodes NSVT. Will reload IV amio. Interrogate device. Prognosis remains poor but patient and his wife continue to want everything done.   Bensimhon, Daniel,MD 6:09 PM

## 2014-10-09 NOTE — Progress Notes (Signed)
Patient dialysis system clotted at 10 am,patient was rinsed back. Patient was sitting up in chair waiting to restart treatment when at about 1020 patient was notified to be passing out. Rapid response was called. Patient responded to bolus of saline and oxygen was applied. Dr sanford discontinued hemodialysis treatment. When patient's vital signs became stable, he was transferred back to his room

## 2014-10-09 NOTE — Progress Notes (Signed)
Physical Therapy Treatment Patient Details Name: Larry [name] MRN: [id number] DOB: [birth year] Today's Date: 10/09/2014    History of Present Illness Larry [name] is a 64 y.o. male who complains of left hip pain after sustaining a mechanical fall at home. Pt with left intertrochanteric fx s/p IM nail. PMHx NICM, CAD, DDD, back pain, CVA, obesity. 7/23 CRRT initiated, 7/31 CRRT discontinued. On regular hemodialysis.    PT Comments    Pt progressed well with ambulation today despite event in HD. Ambulated 140' with RW and min-guard A. O2 sats 99% on 3L O2, HR 63 bpm. PT will continue to follow.   Follow Up Recommendations  SNF;Supervision for mobility/OOB     Equipment Recommendations  None recommended by PT    Recommendations for Other Services       Precautions / Restrictions Precautions Precautions: Fall Restrictions Weight Bearing Restrictions: No LLE Weight Bearing: Weight bearing as tolerated    Mobility  Bed Mobility Overal bed mobility: Needs Assistance Bed Mobility: Supine to Sit     Supine to sit: Min assist     General bed mobility comments: pt needed min HHA for elevating trunk from SL to sit and min A to achieve full upright position but once up, pt able to scoot to EOB on his own  Transfers Overall transfer level: Needs assistance Equipment used: Rolling walker (2 wheeled) Transfers: Sit to/from Stand Sit to Stand: Min assist         General transfer comment: pt prefers to put one hand on RW and push down, RW has to be stabilized by therapist (or wife) for him to do this safely. No physical assist to pt once RW stabilized  Ambulation/Gait Ambulation/Gait assistance: Min guard Ambulation Distance (Feet): 140 Feet Assistive device: Rolling walker (2 wheeled) Gait Pattern/deviations: Step-through pattern;Shuffle;Trunk flexed Gait velocity: Decreased Gait velocity interpretation: <1.8 ft/sec, indicative of risk for recurrent falls General Gait  Details: pt fatigued from events of morning but still did well with ambulation. ENcouraged pt to self monitor, took 2 standing rest breaks. WOrked on erect posture during Advertising account executive Rankin (Stroke Patients Only)       Balance Overall balance assessment: Needs assistance Sitting-balance support: No upper extremity supported Sitting balance-Leahy Scale: Good     Standing balance support: Bilateral upper extremity supported Standing balance-Leahy Scale: Poor Standing balance comment: needed UE support for safe standing today                    Cognition Arousal/Alertness: Lethargic Behavior During Therapy: WFL for tasks assessed/performed Overall Cognitive Status: Within Functional Limits for tasks assessed Area of Impairment: Memory Orientation Level: Disoriented to;Time Current Attention Level: Alternating Memory: Decreased short-term memory Following Commands: Follows one step commands consistently;Follows multi-step commands consistently       General Comments: pt appropriate throughout session today, following commands, and taking safety precautions as well as asking therapist of he is using RW safely    Exercises General Exercises - Upper Extremity Shoulder Flexion: AROM;Both;5 reps;Standing Shoulder ABduction: AROM;Both;5 reps;Standing General Exercises - Lower Extremity Hip ABduction/ADduction: AROM;10 reps;Both;Standing Hip Flexion/Marching: AROM;Both;10 reps;Standing Mini-Sqauts: AROM;10 reps;Standing Other Exercises Other Exercises: trunk rotation in standing, shoulder shrugs    General Comments        Pertinent Vitals/Pain Pain Assessment: No/denies pain    Home Living  Prior Function            PT Goals (current goals can now be found in the care plan section) Acute Rehab PT Goals Patient Stated Goal: Go home PT Goal Formulation: With patient Time For  Goal Achievement: 10/21/14 Potential to Achieve Goals: Good Progress towards PT goals: Progressing toward goals    Frequency  Min 3X/week    PT Plan Current plan remains appropriate    Co-evaluation             End of Session Equipment Utilized During Treatment: Gait belt;Oxygen Activity Tolerance: Patient tolerated treatment well Patient left: in chair;with call bell/phone within reach;with family/visitor present     Time: 2197-5883 PT Time Calculation (min) (ACUTE ONLY): 26 min  Charges:  $Gait Training: 23-37 mins                    G Codes:     Leighton Roach, PT  Acute Rehab Services  [phone]  Leighton Roach 10/09/2014, 4:15 PM

## 2014-10-09 NOTE — Progress Notes (Addendum)
Rapid Response RN called regarding possible ICD going off versus pt being "paced" out of vtach upstairs during HD. Oda Kilts PA in dictation room and phone was given to him to speak to Rapid Response RN regarding event.   1100 pt brought back from HD in stable condition. Dr. Missy Sabins, Darrick Grinder NP at bedside.   Maurene Capes RN

## 2014-10-10 LAB — GLUCOSE, CAPILLARY
GLUCOSE-CAPILLARY: 125 mg/dL — AB (ref 65–99)
GLUCOSE-CAPILLARY: 145 mg/dL — AB (ref 65–99)
GLUCOSE-CAPILLARY: 153 mg/dL — AB (ref 65–99)
Glucose-Capillary: 136 mg/dL — ABNORMAL HIGH (ref 65–99)

## 2014-10-10 LAB — RENAL FUNCTION PANEL
ALBUMIN: 2.9 g/dL — AB (ref 3.5–5.0)
Anion gap: 10 (ref 5–15)
BUN: 17 mg/dL (ref 6–20)
CALCIUM: 8.5 mg/dL — AB (ref 8.9–10.3)
CO2: 25 mmol/L (ref 22–32)
CREATININE: 3.57 mg/dL — AB (ref 0.61–1.24)
Chloride: 95 mmol/L — ABNORMAL LOW (ref 101–111)
GFR, EST AFRICAN AMERICAN: 20 mL/min — AB (ref >60)
GFR, EST NON AFRICAN AMERICAN: 17 mL/min — AB (ref >60)
Glucose, Bld: 113 mg/dL — ABNORMAL HIGH (ref 65–99)
Phosphorus: 4.3 mg/dL (ref 2.5–4.6)
Potassium: 3.8 mmol/L (ref 3.5–5.1)
SODIUM: 130 mmol/L — AB (ref 135–145)

## 2014-10-10 MED ORDER — POTASSIUM CHLORIDE CRYS ER 20 MEQ PO TBCR
40.0000 meq | EXTENDED_RELEASE_TABLET | Freq: Once | ORAL | Status: AC
  Administered 2014-10-10: 40 meq via ORAL
  Filled 2014-10-10: qty 2

## 2014-10-10 NOTE — Progress Notes (Signed)
Left unna boot

## 2014-10-10 NOTE — Progress Notes (Signed)
Occupational Therapy Treatment Patient Details Name: Larry [name] MRN: [id number] DOB: 1950/09/03 Today's Date: 10/10/2014    History of present illness Larry [name] is a 64 y.o. male who complains of left hip pain after sustaining a mechanical fall at home. Pt with left intertrochanteric fx s/p IM nail. PMHx NICM, CAD, DDD, back pain, CVA, obesity. 7/23 CRRT initiated, 7/31 CRRT discontinued. On regular hemodialysis.   OT comments  Patient making OT progress, continue plan of care for now. Pt continues to be limited by increased anxiety. Education provided regarding strategies to help decrease anxiety. Will continue Tuesday/Thursday plan for OT.    Follow Up Recommendations  SNF;Supervision/Assistance - 24 hour    Equipment Recommendations  3 in 1 bedside comode;Hospital bed    Recommendations for Other Services  None at this time   Precautions / Restrictions Precautions Precautions: Fall Restrictions Weight Bearing Restrictions: Yes LLE Weight Bearing: Weight bearing as tolerated    Mobility Bed Mobility General bed mobility comments: Pt found seated in recliner upon OT entering/exiting room  Transfers General transfer comment: Did not occur    Balance Overall balance assessment: Needs assistance Sitting-balance support: No upper extremity supported;Feet supported Sitting balance-Leahy Scale: Good   ADL Overall ADL's : Needs assistance/impaired General ADL Comments: Encouraged pt to use AE for LB ADLs, plan to make sure he knows how to use a reacher, sock aid, LH sponge, LH shoe horn during next OT session. Educated pt on BUE body weight exercises, see exercise tab. Reiterated tips for decreasing anxiety, encouraged pt to think about things he normally does to decrease anxiety and carry that over to when he has HD. Modified BSC over toilet seat to increase independence with those transfers.       Cognition   Behavior During Therapy: WFL for tasks  assessed/performed Overall Cognitive Status: Within Functional Limits for tasks assessed      Exercises General Exercises - Upper Extremity Shoulder Flexion: AROM;20 reps;Seated;Strengthening (2 sets of 10) Shoulder ABduction: AROM;Strengthening;Both;20 reps;Seated (2 sets of 10) Elbow Flexion: AROM;Strengthening;Seated;20 reps (2 sets of 10) Wrist Flexion: AROM;Strengthening;20 reps;Seated (2 set of 10) Wrist Extension: AROM;Strengthening;20 reps;Seated (2 sets of 10) Digit Composite Flexion: AROM;Strengthening;Both;20 reps;Seated (2 sets of 10) Composite Extension: AROM;Both;Seated;Strengthening (2 sets of 10)           Pertinent Vitals/ Pain       Pain Assessment: No/denies pain   Frequency Min 2X/week     Progress Toward Goals  OT Goals(current goals can now befound in the care plan section)  Progress towards OT goals: Progressing toward goals     Plan Discharge plan remains appropriate    End of Session     Activity Tolerance Patient tolerated treatment well   Patient Left in chair;with call bell/phone within reach;with family/visitor present    Time: 1132-1209 OT Time Calculation (min): 37 min  Charges: OT General Charges $OT Visit: 1 Procedure OT Treatments $Self Care/Home Management : 8-22 mins $Therapeutic Exercise: 8-22 mins  Leeroy Lovings , MS, OTR/L, CLT Pager: [phone]  10/10/2014, 12:59 PM

## 2014-10-10 NOTE — Consult Note (Addendum)
WOC wound follow up Wound type: WOC consult for BLE was performed on 8/20, refer to previous progress notes.  Measurement: RLE previous Una boot removed to assess leg. Right previous bulla to anterior foot has pink dry woundbed, 6X6X.1cm, scant amt yellow drainage, no odor.  Right posterior ankle with generalized erythemia but no open wound to ankle or heel. Right calf with 1X1cm dry dark red scab from previous blister.  No further edema or erythremia to RLE.  Left anterior footprevious bulla to anterior foot has pink moist woundbed, 5X5X.1cm, mod amt yellow drainage, no odor.  Posterior heel with previous stage 2 pressure injury, now 2X2cm pink dry and healing, no odor, drainage, or depth. LLE without erythremia or further blisters, but remains with some edema. Buttocks without any further wounds; all previous sites are dry and healed, some patchy areas of dry peeling skin. Skin remains darker in color to inner gluteal folds.  This is not a deep tissue injury, since it has been present several weeks and is generalized.  It appears to be patient's normal skin tone. Dressing procedure/placement/frequency: Applied foam dressing anterior bilat feet and left heel. Pt and wife at bedside understand the importance of offloading pressure to left heel to promote healing. Paged ortho tech to apply bilat Una boots and Coban over foam dressings to left leg and change Q Mon and Thurs.This is no longer needed to right leg. Discussed plan of care with patient and wife and they deny further questions. Pt will need assistance after discharge for twice weekly compression wrap changes at the facility where he plans to transfer. Please re-consult if further assistance is needed. Thank-you,  Julien Girt MSN, [place], [place], [place], [place]

## 2014-10-10 NOTE — Progress Notes (Signed)
Patient ID: Larry [name], male   DOB: November 21, 1950, 64 y.o.   MRN: [id number]   Subjective:    Larry [name] is a 64 y/o man with history of chronic systolic CHF (nonischemic cardiomyopathy) s/p St. 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 He is on chronic milrinone 0.5 mcg.  Prior to admit he was being considered for LVAD.   Discharged 7/16 after dental procedures. Also had RHC with elevate pulmonary pressures. Revatio was increased to 80 mg tid during that stay. Admitted with fall---> broken L  hip. 7/18 had IM Nail L femur.  7/23 started CVVHD, CVVH stopped 7/31.  8/6 began having severe right ab/flank pain and was found to have large retroperitoneal hematoma 8/7 with transfer to ICU. 8/27 SVT 8/31 3 min VT and loss of consciousness. HD stopped.  Rapid response evaluated. Amio drip restarted. .   Complaining of fatigue and nausea.    Objective:   Weight Range:  Vital Signs:   Temp:  [97.6 F (36.4 C)-98.5 F (36.9 C)] 97.6 F (36.4 C) (09/01 1004) Pulse Rate:  [63-68] 68 (09/01 1004) Resp:  [17-18] 17 (09/01 1004) BP: (93-106)/(57-65) 104/65 mmHg (09/01 1004) SpO2:  [94 %-98 %] 94 % (09/01 1004) Weight:  [212 lb 4.8 oz (96.299 kg)] 212 lb 4.8 oz (96.299 kg) (09/01 0644) Last BM Date: 10/09/14  Weight change: Filed Weights   10/09/14 0524 10/09/14 0711 10/10/14 0644  Weight: 213 lb 12.8 oz (96.979 kg) 214 lb 15.2 oz (97.5 kg) 212 lb 4.8 oz (96.299 kg)    Intake/Output:   Intake/Output Summary (Last 24 hours) at 10/10/14 1430 Last data filed at 10/10/14 1300  Gross per 24 hour  Intake 1422.11 ml  Output     76 ml  Net 1346.11 ml     Physical Exam: General:  NAD. In chair HEENT: normal Neck: supple. JVP  8-9 cm. Tunneled HD catheter.  Carotids 2+ bilat; no bruits. No lymphadenopathy or thryomegaly appreciated. Cor: PMI nondisplaced. Regular rate & rhythm. No rubs, gallops or murmurs.  AICD  under left clavicle  Lungs: clear Abdomen: soft, nondistended. Extremities: no cyanosis, clubbing, rash. R and LLE unna boots in place.  Neuro: Alert/oriented  Telemetry: NSR 70s  Labs: Basic Metabolic Panel:  Recent Labs Lab 10/06/14 0457 10/07/14 0304 10/08/14 0522 10/09/14 0353 10/10/14 0549  NA 132* 132* 129* 131* 130*  K 3.9 4.1 4.5 4.0 3.8  CL 97* 97* 95* 95* 95*  CO2 26 26 21* 26 25  GLUCOSE 113* 116* 111* 118* 113*  BUN 27* 22* 33* 20 17  CREATININE 4.40* 4.06* 4.86* 3.79* 3.57*  CALCIUM 8.6* 8.4* 8.7* 8.6* 8.5*  MG  --   --   --  2.0  --   PHOS 5.0* 4.2 5.1* 4.2 4.3    Liver Function Tests:  Recent Labs Lab 10/06/14 0457 10/07/14 0304 10/08/14 0522 10/09/14 0353 10/10/14 0549  ALBUMIN 2.7* 2.8* 3.0* 3.0* 2.9*    CBC:  Recent Labs Lab 10/05/14 0415 10/08/14 0522 10/09/14 0749  WBC 6.6 6.2 5.7  HGB 10.5* 10.3* 10.2*  HCT 32.6* 33.6* 33.6*  MCV 98.2 100.6* 101.2*  PLT PLATELET CLUMPS NOTED ON SMEAR, COUNT APPEARS ADEQUATE 195 169    Cardiac Enzymes: No results for input(s): CKTOTAL, CKMB, CKMBINDEX, TROPONINI in the last 168 hours.  BNP: BNP (last 3 results)  Recent Labs  05/22/14 1029 05/27/14 1126 06/19/14 1030  BNP 935.2*  1627.8* 738.9*    ProBNP (last 3 results)  Recent Labs  11/14/13 0926 01/14/14 1112  PROBNP 1261.0* 5769.0*      Other results:   Imaging: No results found.   Medications:     Scheduled Medications: . atorvastatin  40 mg Oral q1800  . bisacodyl  5 mg Oral QHS  . darbepoetin (ARANESP) injection - DIALYSIS  100 mcg Intravenous Q Mon-HD  . DULoxetine  30 mg Oral Daily  . feeding supplement  1 Container Oral Q24H  . feeding supplement (ENSURE ENLIVE)  237 mL Oral Q24H  . insulin aspart  0-9 Units Subcutaneous TID AC & HS  . levothyroxine  75 mcg Oral QAC breakfast  . LORazepam  0.5 mg Intravenous Once  . midodrine  10 mg Oral TID  . multivitamin  1 tablet Oral QHS  . sildenafil  80 mg Oral TID     Infusions: . amiodarone 30 mg/hr (10/10/14 8676)  . sodium chloride irrigation      PRN Medications: albuterol, ALPRAZolam, bisacodyl, ondansetron (ZOFRAN) IV, oxyCODONE-acetaminophen **AND** oxyCODONE, polyethylene glycol, sodium chloride, temazepam   Assessment:   1. Left Hip Fracture s/p IM nail on 08/26/14 2. Chronic systolic CHF- Nonischemic cardiomyopathy, on home milrinone 0.5 mcg/kg/min, decreased to 0.25 here with NSVT. Became hypotensive, transitioned to dobutamine 2.5 => now off dobutamine as well.  3. Acute on chronic renal failure - suspect post-op ATN.  CVVH => HD.  4.  Paroxysmal atrial fibrillation requiring DCCV 02/2014, 04/2014- on chronic amio and xarelto prior to admission. CHADS VASc Score- 5.  Remains in NSR.  5. Diabetes mellitus type 2 6. Depression/anxiety 7. OSA, complex- Needs Bipap at night but had difficulty after dental procedure.  8 Pulmonary HTN - Mixed pulmonary venous hypertension and PAH- on Revatio 80 mg tid 9. Dental extractions 08/2014 by Dr Enrique Sack 10. Obesity 11. Probable chronic respiratory failure - requiring home O2 with ambulation as of this admission 12. Hyponatremia   13. VT 14. Delirium- Improved.   15. Hemorrhagic shock with massive retroperitoneal hematoma 8/6 => off anticoagulation.  16. Stage II Pressure Ulcers- Heel Pressure ulcer 17. SVT 18. Anxiety  Plan/Discussion:    No further VT. Continue amio drip for one more day then transition to 400 mg twice a day for 1 week then 200 mg twice a day for  1 week then  200 mg daily.   Volume managed with HD. Plan for HD tomorrow prior to d/c to SNF.   Continue SCDs instead of low dose Lovenox after spontaneous RP hematoma. Will need to leave off anticoagulation for the time being despite CVA risk from atrial fibrillation.  After about 1 more week, will start coumadin with goal INR 2-2.5 => can set up at his followup appt.  Disposition: He can go to SNF with outpatient HD 4 x /week  per Dr Joelyn Oms.  Ready for discharge when bed is a available.    He understands poor prognosis but not ready for hospice care.  I have doubts about his ability to tolerate hemodialysis long-term but he wants to continue to try. Palliative care following, currently partial code.    CLEGG,AMY  NP-C  2:30 PM  10/10/2014   Patient seen with NP, agree with the above note.  He is having a hard time with HD.  Ongoing nausea.  VT with HD yesterday, no further VT with reloading amiodarone.  K was a bit low when he had VT.  Plan to dose amiodarone as per  NP Clegg's note above.    He will have HD in the am tomorrow.  If he tolerates this, can go to SNF in the afternoon.   Loralie Champagne 10/10/2014 4:47 PM

## 2014-10-10 NOTE — Progress Notes (Addendum)
Patient ID: Larry [name], male   DOB: [birth year], 64 y.o.   MRN: [id number] S: Hd yesterday had LOC and sustained VTach, restarted amio load, AHF following.  Was on 2K bath with pre HD K of 4  O:BP 104/65 mmHg  Pulse 68  Temp(Src) 97.6 F (36.4 C) (Oral)  Resp 17  Ht 5\' 10"  (1.778 m)  Wt 96.299 kg (212 lb 4.8 oz)  BMI 30.46 kg/m2  SpO2 94%  Intake/Output Summary (Last 24 hours) at 10/10/14 1050 Last data filed at 10/10/14 1005  Gross per 24 hour  Intake 1182.11 ml  Output     76 ml  Net 1106.11 ml   Intake/Output: I/O last 3 completed shifts: In: 722.1 [P.O.:360; I.V.:362.1] Out: 2420 [Other:2419; Stool:1]  Intake/Output this shift:  Total I/O In: 460 [P.O.:460] Out: 75 [Urine:75] Weight change: -0.9 kg (-1 lb 15.7 oz)    PYP:PJKDT WM in NAD, sitting in chair,  CVS:no rub Resp:decreased BS at bases OIZ:TIWPY, NT,ND.  Ext:1-2+edema, wrapped with dressing right AVF +T/B   Recent Labs Lab 10/04/14 0405 10/05/14 0705 10/06/14 0457 10/07/14 0304 10/08/14 0522 10/09/14 0353 10/10/14 0549  NA 129* 133* 132* 132* 129* 131* 130*  K 4.0 3.6 3.9 4.1 4.5 4.0 3.8  CL 94* 98* 97* 97* 95* 95* 95*  CO2 23 24 26 26  21* 26 25  GLUCOSE 112* 106* 113* 116* 111* 118* 113*  BUN 27* 20 27* 22* 33* 20 17  CREATININE 4.15* 3.71* 4.40* 4.06* 4.86* 3.79* 3.57*  ALBUMIN 3.0* 2.8* 2.7* 2.8* 3.0* 3.0* 2.9*  CALCIUM 8.3* 8.4* 8.6* 8.4* 8.7* 8.6* 8.5*  PHOS 5.2* 4.4 5.0* 4.2 5.1* 4.2 4.3   Liver Function Tests:  Recent Labs Lab 10/08/14 0522 10/09/14 0353 10/10/14 0549  ALBUMIN 3.0* 3.0* 2.9*   No results for input(s): LIPASE, AMYLASE in the last 168 hours. No results for input(s): AMMONIA in the last 168 hours. CBC:  Recent Labs Lab 10/05/14 0415 10/08/14 0522 10/09/14 0749  WBC 6.6 6.2 5.7  HGB 10.5* 10.3* 10.2*  HCT 32.6* 33.6* 33.6*  MCV 98.2 100.6* 101.2*  PLT PLATELET CLUMPS NOTED ON SMEAR, COUNT APPEARS ADEQUATE 195 169   Cardiac Enzymes: No results for  input(s): CKTOTAL, CKMB, CKMBINDEX, TROPONINI in the last 168 hours. CBG:  Recent Labs Lab 10/09/14 0615 10/09/14 1113 10/09/14 1651 10/09/14 2120 10/10/14 0635  GLUCAP 116* 125* 106* 124* 125*    Iron Studies: No results for input(s): IRON, TIBC, TRANSFERRIN, FERRITIN in the last 72 hours. Studies/Results: No results found. [name] atorvastatin  40 mg Oral q1800  . bisacodyl  5 mg Oral QHS  . darbepoetin (ARANESP) injection - DIALYSIS  100 mcg Intravenous Q Mon-HD  . DULoxetine  30 mg Oral Daily  . feeding supplement  1 Container Oral Q24H  . feeding supplement (ENSURE ENLIVE)  237 mL Oral Q24H  . insulin aspart  0-9 Units Subcutaneous TID AC & HS  . levothyroxine  75 mcg Oral QAC breakfast  . LORazepam  0.5 mg Intravenous Once  . midodrine  10 mg Oral TID  . multivitamin  1 tablet Oral QHS  . sildenafil  80 mg Oral TID    BMET    Component Value Date/Time   NA 130* 10/10/2014 0549   K 3.8 10/10/2014 0549   CL 95* 10/10/2014 0549   CO2 25 10/10/2014 0549   GLUCOSE 113* 10/10/2014 0549   BUN 17 10/10/2014 0549   CREATININE 3.57* 10/10/2014 0549   CALCIUM 8.5*  10/10/2014 0549   CALCIUM 7.9* 09/20/2014 1315   GFRNONAA 17* 10/10/2014 0549   GFRAA 20* 10/10/2014 0549   CBC    Component Value Date/Time   WBC 5.7 10/09/2014 0749   RBC 3.32* 10/09/2014 0749   HGB 10.2* 10/09/2014 0749   HCT 33.6* 10/09/2014 0749   PLT 169 10/09/2014 0749   MCV 101.2* 10/09/2014 0749   MCH 30.7 10/09/2014 0749   MCHC 30.4 10/09/2014 0749   RDW 21.2* 10/09/2014 0749   LYMPHSABS 1.0 10/02/2014 0930   MONOABS 0.8 10/02/2014 0930   EOSABS 0.3 10/02/2014 0930   BASOSABS 0.1 10/02/2014 0930    64 yo M with hxo f DM II, PAF, hypothyroidism, CAD, 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  1. New ESRD  1. Clipped to 4x/wk outpt at [facility] 2. Maturing AVF RUE AVF [facility] [place] 8/23, using TDC 3. Will plan on 3K bath moving forward, transient hypokalemia might have  precipitated, if K < 4.5 would do 4K bath 4. Follow K lcosely as outpt, weekly 5. Pt aware of uphill circumstances of his overall health, wishes to push forward 6. OK with discharge from renal perspective 2. HTN/Vol 1. Needs Lower EDW, needs to be done gradually with CHF 2. Cont to address as outpt 3. Sustained VTach during HD 8/31: AHF following, as per #1 4. Severe cardiomyopathy s/p ICD EF 25%. Not candidate for LVAD. AHF managing 5. PAF - on amio now (on increased amio to BID by cards for episode of SVT) 6. Abd wall hematoma, improving - holding xarelto. hgb stable.  7. SP Lt hip fx - needs rehab.  8. Anemia on aranesp, received IV iron 9. Sec HPTH not requiring Vit D PTH 90 on 8/8 10. Bullae on legs - 2/2 to volume overload -improving, wound care following.  11. Anxiety -especially at HD sessions - on xanax currently. Follows with outpatient psych.  Rexene Agent

## 2014-10-10 NOTE — Progress Notes (Signed)
CSW spoke to Dr. Aundra Dubin earlier today re: possible d/c disposition for patient. He stated that he planned to d/c patient's IV Amioderone and change to orals meds. Will monitor today and if stable- plan d/c to SNF tomorrow.  CSW spoke with Nira Conn- Admissions at [facility] who indicated that she would hold private room for patient one more day with planned d/c tomorrow. Patient's wife notified and was very relieved with this information. CSW will assist with d/c tomorrow to SNF if medically stable. [name]. [name], [place]

## 2014-10-10 NOTE — Progress Notes (Signed)
Nutrition Follow-up  DOCUMENTATION CODES:   Obesity unspecified  INTERVENTION:  Ensure Enlive po once daily, each supplement provides 350 kcal and 20 grams of protein Provide snacks BID Encourage PO intake   NUTRITION DIAGNOSIS:   Increased nutrient needs related to wound healing as evidenced by estimated needs.  ongoing  GOAL:   Patient will meet greater than or equal to 90% of their needs  Unmet  MONITOR:   PO intake, Labs, Weight trends, I & O's, Skin  REASON FOR ASSESSMENT:   Consult Assessment of nutrition requirement/status  ASSESSMENT:   64 y.o. Male who complains of left hip pain after sustaining a mechanical fall at home this morning. He presented to the ER for evaluation after being unable to ambulate without pain. Prior to the fall, the patient was abulatory without any assistive devices.   Pt continues to lose weight with additional 6 lb loss in the past week. Per nursing notes pt is eating 25-50% meals some days and 75-100% other days. Pt reports eating 50% of meals; he doesn't like Ensure but, drinks it once daily. He has not been drinking [employer]. RD encouraged PO intake and continued intake of Ensure daily. Recommended snacking in between meals, pt agreeable. Per chart, posterior heal with previous stage 2 pressure ulcers is now dry and healing.   Labs: low sodium, low chloride, low calcium  Diet Order:  Diet renal/carb modified with fluid restriction Diet-HS Snack?: Nothing; Room service appropriate?: Yes; Fluid consistency:: Thin  Skin:  Wound (see comment) (stage II left heel, DTI left sacrum)  Last BM:  9/1  Height:   Ht Readings from Last 1 Encounters:  09/22/14 5\' 10"  (1.778 m)    Weight:   Wt Readings from Last 1 Encounters:  10/10/14 212 lb 4.8 oz (96.299 kg)    Ideal Body Weight:  75.4 kg  BMI:  Body mass index is 30.46 kg/(m^2).  Estimated Nutritional Needs:   Kcal:  2000-2200  Protein:  105-115 gm  Fluid:  2.0-2.2  L  EDUCATION NEEDS:   Education needs addressed  Scarlette Ar RD, LDN Inpatient Clinical Dietitian Pager: [phone] After Hours Pager: [phone]

## 2014-10-11 LAB — CBC
HCT: 35.9 % — ABNORMAL LOW (ref 39.0–52.0)
Hemoglobin: 11.2 g/dL — ABNORMAL LOW (ref 13.0–17.0)
MCH: 31.5 pg (ref 26.0–34.0)
MCHC: 31.2 g/dL (ref 30.0–36.0)
MCV: 100.8 fL — ABNORMAL HIGH (ref 78.0–100.0)
PLATELETS: 216 10*3/uL (ref 150–400)
RBC: 3.56 MIL/uL — AB (ref 4.22–5.81)
RDW: 21 % — AB (ref 11.5–15.5)
WBC: 7.4 10*3/uL (ref 4.0–10.5)

## 2014-10-11 LAB — RENAL FUNCTION PANEL
ANION GAP: 11 (ref 5–15)
Albumin: 2.9 g/dL — ABNORMAL LOW (ref 3.5–5.0)
BUN: 25 mg/dL — ABNORMAL HIGH (ref 6–20)
CALCIUM: 8.6 mg/dL — AB (ref 8.9–10.3)
CHLORIDE: 94 mmol/L — AB (ref 101–111)
CO2: 23 mmol/L (ref 22–32)
CREATININE: 4.86 mg/dL — AB (ref 0.61–1.24)
GFR, EST AFRICAN AMERICAN: 13 mL/min — AB (ref >60)
GFR, EST NON AFRICAN AMERICAN: 12 mL/min — AB (ref >60)
Glucose, Bld: 130 mg/dL — ABNORMAL HIGH (ref 65–99)
Phosphorus: 5.2 mg/dL — ABNORMAL HIGH (ref 2.5–4.6)
Potassium: 4.3 mmol/L (ref 3.5–5.1)
SODIUM: 128 mmol/L — AB (ref 135–145)

## 2014-10-11 LAB — GLUCOSE, CAPILLARY
GLUCOSE-CAPILLARY: 126 mg/dL — AB (ref 65–99)
Glucose-Capillary: 127 mg/dL — ABNORMAL HIGH (ref 65–99)

## 2014-10-11 MED ORDER — MIDODRINE HCL 10 MG PO TABS: 10.0000 mg | ORAL_TABLET | Freq: Three times a day (TID) | ORAL | Status: AC

## 2014-10-11 MED ORDER — TEMAZEPAM 7.5 MG PO CAPS: 7.5000 mg | ORAL_CAPSULE | Freq: Every evening | ORAL | Status: DC | PRN

## 2014-10-11 MED ORDER — MIDODRINE HCL 5 MG PO TABS
ORAL_TABLET | ORAL | Status: AC
  Filled 2014-10-11: qty 2

## 2014-10-11 MED ORDER — OXYCODONE-ACETAMINOPHEN 10-325 MG PO TABS: 1.0000 | ORAL_TABLET | ORAL | Status: DC | PRN

## 2014-10-11 MED ORDER — AMIODARONE HCL 200 MG PO TABS
400.0000 mg | ORAL_TABLET | Freq: Two times a day (BID) | ORAL | Status: DC
  Administered 2014-10-11: 400 mg via ORAL
  Filled 2014-10-11: qty 2

## 2014-10-11 MED ORDER — BISACODYL 10 MG RE SUPP: 10.0000 mg | Freq: Every day | RECTAL | Status: AC | PRN

## 2014-10-11 MED ORDER — POLYETHYLENE GLYCOL 3350 17 G PO PACK: 17.0000 g | PACK | Freq: Every day | ORAL | Status: AC | PRN

## 2014-10-11 MED ORDER — ALPRAZOLAM 1 MG PO TABS: 1.0000 mg | ORAL_TABLET | Freq: Two times a day (BID) | ORAL | Status: DC | PRN

## 2014-10-11 MED ORDER — RENA-VITE PO TABS: 1.0000 | ORAL_TABLET | Freq: Every day | ORAL | Status: AC

## 2014-10-11 MED ORDER — AMIODARONE HCL 400 MG PO TABS
ORAL_TABLET | ORAL | Status: DC
Start: 2014-10-11 — End: 2014-10-29

## 2014-10-11 MED ORDER — ONDANSETRON HCL 4 MG PO TABS: 4.0000 mg | ORAL_TABLET | Freq: Three times a day (TID) | ORAL | Status: AC | PRN

## 2014-10-11 NOTE — Progress Notes (Signed)
Report given to Margarito Courser at [facility] facility

## 2014-10-11 NOTE — Clinical Social Work Placement (Signed)
   CLINICAL SOCIAL WORK PLACEMENT  NOTE  Date:  10/11/2014  Patient Details  Name: Larry [name] MRN: [id number] Date of Birth: 03/14/1950  Clinical Social Work is seeking post-discharge placement for this patient at the [place] level of care (*CSW will initial, date and re-position this form in  chart as items are completed):  Yes   Patient/family provided with [place] Work Department's list of facilities offering this level of care within the geographic area requested by the patient (or if unable, by the patient's family).  Yes   Patient/family informed of their freedom to choose among providers that offer the needed level of care, that participate in Medicare, Medicaid or managed care program needed by the patient, have an available bed and are willing to accept the patient.  Yes   Patient/family informed of [facility]'s ownership interest in [facility] and [facility], as well as of the fact that they are under no obligation to receive care at these facilities.  PASRR submitted to EDS on 08/26/14     PASRR number received on 08/26/14     Existing PASRR number confirmed on       FL2 transmitted to all facilities in geographic area requested by pt/family on    FL2 transmitted to all facilities within larger geographic area on    08/28/14    Patient informed that his/her managed care company has contracts with or will negotiate with certain facilities, including the following:   [facility] (commercial))     Yes   Patient/family informed of bed offers received.  Patient chooses bed at [place] [place]     Physician recommends and patient chooses bed at      Patient to be transferred to [place] [place] on 10/11/14.  Patient to be transferred to facility by Ambulance [facility])     Patient family notified on 10/11/14 of transfer.  Name of family member notified:  Wife:  Henry Utsey     PHYSICIAN Please  prepare priority discharge summary, including medications, Please sign FL2, Please prepare prescriptions     Additional Comment: Ok per MD for d/c today to SNF. EMS contacted and d/c packet prepared. Patient and wife are both pleased with obtaining a bed at [facility] as his mother had been a former patient there.  Support provided to patient's wife as she discussed her worries and fatigue issues due to patient's long hospitalization.  Nursing notified to call report to SNF. No further CSW needs identified. CSW signing off.     _______________________________________________ Williemae Area, LCSW 10/11/2014, 3:50 PM

## 2014-10-11 NOTE — Progress Notes (Signed)
Patient ID: Larry [name], male   DOB: 1950-07-04, 64 y.o.   MRN: [id number]   Subjective:    Larry [name] is a 64 y/o man with history of chronic systolic CHF (nonischemic cardiomyopathy) s/p 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 He is on chronic milrinone 0.5 mcg.  Prior to admit he was being considered for LVAD.   Discharged 7/16 after dental procedures. Also had RHC with elevate pulmonary pressures. Revatio was increased to 80 mg tid during that stay. Admitted with fall---> broken L  hip. 7/18 had IM Nail L femur.  7/23 started CVVHD, CVVH stopped 7/31.  8/6 began having severe right ab/flank pain and was found to have large retroperitoneal hematoma 8/7 with transfer to ICU. 8/27 SVT 8/31 3 min VT and loss of consciousness. HD stopped.  Rapid response evaluated. Amio drip restarted. .   Able to to go to HD in the chair. Denies SOB.     Objective:   Weight Range:  Vital Signs:   Temp:  [97.2 F (36.2 C)-98.9 F (37.2 C)] 97.7 F (36.5 C) (09/02 1137) Pulse Rate:  [61-89] 69 (09/02 1137) Resp:  [10-20] 20 (09/02 1137) BP: (90-110)/(45-84) 102/57 mmHg (09/02 1137) SpO2:  [94 %-100 %] 94 % (09/02 1137) Weight:  [214 lb 14.4 oz (97.478 kg)-215 lb 13.3 oz (97.9 kg)] 215 lb 13.3 oz (97.9 kg) (09/02 0737) Last BM Date: 10/06/14  Weight change: Filed Weights   10/10/14 0644 10/11/14 0416 10/11/14 0737  Weight: 212 lb 4.8 oz (96.299 kg) 214 lb 14.4 oz (97.478 kg) 215 lb 13.3 oz (97.9 kg)    Intake/Output:   Intake/Output Summary (Last 24 hours) at 10/11/14 1217 Last data filed at 10/11/14 1137  Gross per 24 hour  Intake 1382.8 ml  Output   1865 ml  Net -482.2 ml     Physical Exam: General:  NAD. In chair HEENT: normal Neck: supple. JVP  8-9 cm. Tunneled HD catheter.  Carotids 2+ bilat; no bruits. No lymphadenopathy or thryomegaly appreciated. Cor: PMI nondisplaced. Regular rate &  rhythm. No rubs, gallops or murmurs.  AICD under left clavicle  Lungs: clear Abdomen: soft, nondistended. Extremities: no cyanosis, clubbing, rash. R and LLE unna boots in place.  Neuro: Alert/oriented  Telemetry: NSR 70s  Labs: Basic Metabolic Panel:  Recent Labs Lab 10/07/14 0304 10/08/14 0522 10/09/14 0353 10/10/14 0549 10/11/14 0327  NA 132* 129* 131* 130* 128*  K 4.1 4.5 4.0 3.8 4.3  CL 97* 95* 95* 95* 94*  CO2 26 21* 26 25 23   GLUCOSE 116* 111* 118* 113* 130*  BUN 22* 33* 20 17 25*  CREATININE 4.06* 4.86* 3.79* 3.57* 4.86*  CALCIUM 8.4* 8.7* 8.6* 8.5* 8.6*  MG  --   --  2.0  --   --   PHOS 4.2 5.1* 4.2 4.3 5.2*    Liver Function Tests:  Recent Labs Lab 10/07/14 0304 10/08/14 0522 10/09/14 0353 10/10/14 0549 10/11/14 0327  ALBUMIN 2.8* 3.0* 3.0* 2.9* 2.9*    CBC:  Recent Labs Lab 10/05/14 0415 10/08/14 0522 10/09/14 0749 10/11/14 0826  WBC 6.6 6.2 5.7 7.4  HGB 10.5* 10.3* 10.2* 11.2*  HCT 32.6* 33.6* 33.6* 35.9*  MCV 98.2 100.6* 101.2* 100.8*  PLT PLATELET CLUMPS NOTED ON SMEAR, COUNT APPEARS ADEQUATE 195 169 216    Cardiac Enzymes: No results for input(s): CKTOTAL, CKMB, CKMBINDEX, TROPONINI in the last 168 hours.  BNP: BNP (last 3 results)  Recent Labs  05/22/14 1029 05/27/14 1126 06/19/14 1030  BNP 935.2* 1627.8* 738.9*    ProBNP (last 3 results)  Recent Labs  11/14/13 0926 01/14/14 1112  PROBNP 1261.0* 5769.0*      Other results:   Imaging: No results found.   Medications:     Scheduled Medications: . amiodarone  400 mg Oral BID  . atorvastatin  40 mg Oral q1800  . bisacodyl  5 mg Oral QHS  . darbepoetin (ARANESP) injection - DIALYSIS  100 mcg Intravenous Q Mon-HD  . DULoxetine  30 mg Oral Daily  . feeding supplement (ENSURE ENLIVE)  237 mL Oral Q24H  . insulin aspart  0-9 Units Subcutaneous TID AC & HS  . levothyroxine  75 mcg Oral QAC breakfast  . LORazepam  0.5 mg Intravenous Once  . midodrine      .  midodrine  10 mg Oral TID  . multivitamin  1 tablet Oral QHS  . sildenafil  80 mg Oral TID    Infusions: . sodium chloride irrigation      PRN Medications: albuterol, ALPRAZolam, bisacodyl, ondansetron (ZOFRAN) IV, oxyCODONE-acetaminophen **AND** oxyCODONE, polyethylene glycol, sodium chloride, temazepam   Assessment:   1. Left Hip Fracture s/p IM nail on 08/26/14 2. Chronic systolic CHF- Nonischemic cardiomyopathy, on home milrinone 0.5 mcg/kg/min, decreased to 0.25 here with NSVT. Became hypotensive, transitioned to dobutamine 2.5 => now off dobutamine as well.  3. Acute on chronic renal failure - suspect post-op ATN.  CVVH => HD.  4.  Paroxysmal atrial fibrillation requiring DCCV 02/2014, 04/2014- on chronic amio and xarelto prior to admission. CHADS VASc Score- 5.  Remains in NSR.  5. Diabetes mellitus type 2 6. Depression/anxiety 7. OSA, complex- Needs Bipap at night but had difficulty after dental procedure.  8 Pulmonary HTN - Mixed pulmonary venous hypertension and PAH- on Revatio 80 mg tid 9. Dental extractions 08/2014 by Dr Enrique Sack 10. Obesity 11. Probable chronic respiratory failure - requiring home O2 with ambulation as of this admission 12. Hyponatremia   13. VT 14. Delirium- Improved.   15. Hemorrhagic shock with massive retroperitoneal hematoma 8/6 => off anticoagulation.  16. Stage II Pressure Ulcers- Heel Pressure ulcer 17. SVT 18. Anxiety  Plan/Discussion:    No further VT. Stop amio drip transition to 400 mg twice a day for 1 week then 200 mg twice a day .  Volume managed with HD. Plan for d/c  Today. Outpatient HD set up. Plan for outpatient HF M-W-F-Sat at [facility].    Continue SCDs instead of low dose Lovenox after spontaneous RP hematoma. Will need to leave off anticoagulation for the time being despite CVA risk from atrial fibrillation.  After about 1 more week, will start coumadin with goal INR 2-2.5.  Will need INR followed at SNF. Primary MD at  SNF can start coumadin.    He understands poor prognosis but not ready for hospice care.  I have doubts about his ability to tolerate hemodialysis long-term but he wants to continue to try. Palliative care following, currently partial code.    Ready for discharge today. He will travel via ambulance. Plan to d/c to [facility]  Today.   Larry [name],AMY  NP-C  12:17 PM  10/11/2014   Patient seen with NP, agree with the above note.  He tolerated HD today, no further VT.  Plan for discharge to [facility] today.  We will need to see him in 2 wks.  Amiodarone taper  as above.  Start coumadin goal INR 2-2.5 in 1 week.  Wear SCDs until he is back on coumadin.   Larry [name] 10/11/2014 12:42 PM

## 2014-10-11 NOTE — Procedures (Signed)
I was present at this dialysis session. I have reviewed the session itself and made appropriate changes.   4K bath, K is 4.3.  Using [facility].  Hb > 11.  UF goal is 2L.  BP 100/50s,  Toleraing well.  If not issues can dc today to [facility] HD at [facility] stargin tomorrow.    Pearson Grippe  MD 10/11/2014, 9:04 AM

## 2014-10-12 LAB — HEPATITIS B SURFACE ANTIGEN: Hepatitis B Surface Ag: NEGATIVE

## 2014-10-16 ENCOUNTER — Telehealth (HOSPITAL_COMMUNITY): Payer: Self-pay | Admitting: Emergency Medicine

## 2014-10-22 ENCOUNTER — Encounter (HOSPITAL_COMMUNITY): Payer: Self-pay

## 2014-10-22 ENCOUNTER — Telehealth (HOSPITAL_COMMUNITY): Payer: Self-pay | Admitting: Surgery

## 2014-10-22 NOTE — Telephone Encounter (Signed)
Would arrange to have him rescheduled.

## 2014-10-22 NOTE — Telephone Encounter (Signed)
I called and spoke with Mr. Carreon wife- Peter Congo because he missed today's appt in the [facility].  She tells me that she gave all paperwork to [facility] and was unaware that he had an appt scheduled for today.  She also says that they told her that he did not need to keep "physician appts as he was there now" and being seen by their "own provider".  She tells me that he is not weighing daily and his legs are "very swollen".  He is struggling with anxiety and is requesting to end dialysis early each time by about 30 minutes.  I told her that I would run all this by Dr. Aundra Dubin and that I felt sure we would need to reschedule his appt on a day that he is not doing dialysis-- as soon as possible.  I let her know that I would call her back with an update.

## 2014-10-23 ENCOUNTER — Telehealth (HOSPITAL_COMMUNITY): Payer: Self-pay | Admitting: Surgery

## 2014-10-23 NOTE — Telephone Encounter (Signed)
I called Larry [name] to inform her of the rescheduled appt with [facility] on 10/29/14 at 2:40pm.  She assures me that it is added to calender and she will be sure that he will be there. I also contacted Nurse Larry [name] at [facility] to inform her of patient's appt as well.  She assures me that the information will be shared and that he will make appt.  I provided her with the name and address of the [facility].  I requested that he be sent with medication records as well as weight information from dialysis.

## 2014-10-29 ENCOUNTER — Ambulatory Visit (HOSPITAL_COMMUNITY)
Admission: RE | Admit: 2014-10-29 | Discharge: 2014-10-29 | Disposition: A | Discharge status: Home / Self Care | Payer: Commercial Managed Care - HMO | Source: Ambulatory Visit | Attending: Cardiology | Admitting: Cardiology

## 2014-10-29 DIAGNOSIS — Z8673 Personal history of transient ischemic attack (TIA), and cerebral infarction without residual deficits: Secondary | ICD-10-CM | POA: Diagnosis not present

## 2014-10-29 DIAGNOSIS — Z79899 Other long term (current) drug therapy: Secondary | ICD-10-CM | POA: Diagnosis not present

## 2014-10-29 DIAGNOSIS — G4733 Obstructive sleep apnea (adult) (pediatric): Secondary | ICD-10-CM | POA: Insufficient documentation

## 2014-10-29 DIAGNOSIS — F419 Anxiety disorder, unspecified: Secondary | ICD-10-CM | POA: Insufficient documentation

## 2014-10-29 DIAGNOSIS — E785 Hyperlipidemia, unspecified: Secondary | ICD-10-CM | POA: Diagnosis not present

## 2014-10-29 DIAGNOSIS — I272 Other secondary pulmonary hypertension: Secondary | ICD-10-CM | POA: Diagnosis not present

## 2014-10-29 DIAGNOSIS — Z992 Dependence on renal dialysis: Secondary | ICD-10-CM | POA: Insufficient documentation

## 2014-10-29 DIAGNOSIS — Z95810 Presence of automatic (implantable) cardiac defibrillator: Secondary | ICD-10-CM | POA: Diagnosis not present

## 2014-10-29 DIAGNOSIS — F329 Major depressive disorder, single episode, unspecified: Secondary | ICD-10-CM | POA: Insufficient documentation

## 2014-10-29 DIAGNOSIS — Z7902 Long term (current) use of antithrombotics/antiplatelets: Secondary | ICD-10-CM | POA: Diagnosis not present

## 2014-10-29 DIAGNOSIS — Z794 Long term (current) use of insulin: Secondary | ICD-10-CM | POA: Insufficient documentation

## 2014-10-29 DIAGNOSIS — F4310 Post-traumatic stress disorder, unspecified: Secondary | ICD-10-CM | POA: Insufficient documentation

## 2014-10-29 DIAGNOSIS — I482 Chronic atrial fibrillation, unspecified: Secondary | ICD-10-CM

## 2014-10-29 DIAGNOSIS — I5022 Chronic systolic (congestive) heart failure: Secondary | ICD-10-CM | POA: Insufficient documentation

## 2014-10-29 DIAGNOSIS — Z87891 Personal history of nicotine dependence: Secondary | ICD-10-CM | POA: Diagnosis not present

## 2014-10-29 DIAGNOSIS — I2510 Atherosclerotic heart disease of native coronary artery without angina pectoris: Secondary | ICD-10-CM | POA: Insufficient documentation

## 2014-10-29 DIAGNOSIS — I428 Other cardiomyopathies: Secondary | ICD-10-CM | POA: Insufficient documentation

## 2014-10-29 DIAGNOSIS — E039 Hypothyroidism, unspecified: Secondary | ICD-10-CM | POA: Diagnosis not present

## 2014-10-29 DIAGNOSIS — I480 Paroxysmal atrial fibrillation: Secondary | ICD-10-CM | POA: Diagnosis not present

## 2014-10-29 DIAGNOSIS — N186 End stage renal disease: Secondary | ICD-10-CM | POA: Insufficient documentation

## 2014-10-29 DIAGNOSIS — D472 Monoclonal gammopathy: Secondary | ICD-10-CM | POA: Diagnosis not present

## 2014-10-29 DIAGNOSIS — E1122 Type 2 diabetes mellitus with diabetic chronic kidney disease: Secondary | ICD-10-CM | POA: Insufficient documentation

## 2014-10-29 DIAGNOSIS — Z8249 Family history of ischemic heart disease and other diseases of the circulatory system: Secondary | ICD-10-CM | POA: Insufficient documentation

## 2014-10-29 LAB — CBC
HCT: 35.6 % — ABNORMAL LOW (ref 39.0–52.0)
Hemoglobin: 11.1 g/dL — ABNORMAL LOW (ref 13.0–17.0)
MCH: 32.4 pg (ref 26.0–34.0)
MCHC: 31.2 g/dL (ref 30.0–36.0)
MCV: 103.8 fL — AB (ref 78.0–100.0)
PLATELETS: 126 10*3/uL — AB (ref 150–400)
RBC: 3.43 MIL/uL — ABNORMAL LOW (ref 4.22–5.81)
RDW: 20.4 % — AB (ref 11.5–15.5)
WBC: 5.3 10*3/uL (ref 4.0–10.5)

## 2014-10-29 LAB — HEPATIC FUNCTION PANEL
ALK PHOS: 87 U/L (ref 38–126)
ALT: 20 U/L (ref 17–63)
AST: 25 U/L (ref 15–41)
Albumin: 3.1 g/dL — ABNORMAL LOW (ref 3.5–5.0)
BILIRUBIN DIRECT: 0.3 mg/dL (ref 0.1–0.5)
BILIRUBIN TOTAL: 0.6 mg/dL (ref 0.3–1.2)
Indirect Bilirubin: 0.3 mg/dL (ref 0.3–0.9)
Total Protein: 7.3 g/dL (ref 6.5–8.1)

## 2014-10-29 LAB — TSH: TSH: 10.32 u[IU]/mL — ABNORMAL HIGH (ref 0.350–4.500)

## 2014-10-29 MED ORDER — AMIODARONE HCL 400 MG PO TABS: 200.0000 mg | ORAL_TABLET | Freq: Every day | ORAL | Status: AC

## 2014-10-29 NOTE — Patient Instructions (Signed)
Decrease Amiodarone to 200 mg daily  Labs today  Your physician recommends that you schedule a follow-up appointment in: 2 weeks

## 2014-10-30 DIAGNOSIS — Z992 Dependence on renal dialysis: Secondary | ICD-10-CM

## 2014-10-30 DIAGNOSIS — N186 End stage renal disease: Secondary | ICD-10-CM | POA: Insufficient documentation

## 2014-10-30 NOTE — Progress Notes (Signed)
Patient ID: SEBRON MCMAHILL, male   DOB: Dec 03, 1950, 64 y.o.   MRN: [id number] PCP: Dr. Inda Merlin Endocrinologist: Dr Buddy Duty  64 yo with history of chronic systolic CHF (nonischemic cardiomyopathy), CKD, prior CVA, and paroxysmal atrial fibrillation.  Patient says that he has been known to have a low EF for years.  He has a [employer] ICD.  He had LHC in 2009 with nonobstructive disease. Last echo in 12/15 showed EF 20-25% with normal RV size and systolic function.     Patient had [place] 8/15 showing low cardiac output, mildly elevated PCWP and primarily pulmonary venous hypertension.  He was transitioned from Lasix to torsemide and started on digoxin.  Entresto was started and valsartan stopped.   Adenosine Cardiolite was done in 12/15 showing large area of apical scar but no ischemia.  This raised concern for possible ischemic cardiomyopathy.  He was admitted later in 12/15 with acute on chronic systolic CHF.  He also was noted to have gone into atrial fibrillation during this hospitalization, amiodarone was started in preparation for possible DCCV.  Troponin was mildly elevated in the hospital in the absence of chest pain, likely demand ischemia.  He was diuresed with IV Lasix and was discharged soon after.  He was cardioverted back to NSR on 02/14/14.  He was cardioverted again to NSR in 3/16.  He had RHC done 05/08/14 with mixed pulmonary hypertension and PWCP 25, CI 1.75.  CPX done 4/16 showed severe functional limitation.  In 4/16, he was started on home milrinone via PICC at 0.25 mcg/kg/min.  Co-ox increased from 55% => 72%.  Entresto stopped with elevated creatinine.   Mr Maffeo initially remained breathless with NYHA class III symptoms even after starting milrinone.  He came back for LHC/RHC in 5/16.  This showed nonobstructive CAD and elevated filling pressures including severe pulmonary hypertension.  CI was 1.95 by Fick.  I increased milrinone to 0.375, started him on Revatio, and diuresed him.   He had RHC  again in 7/16, showing elevated right and left heart filling pressures and severe pulmonary HTN.  Milrinone was increased to 0.5 and Revatio was increased.  He was discharged home but had a fall with left femoral fracture.  The fracture was repaired but he developed AKI post-op initially requiring CVVH then HD. He was able to come off inotropes after starting HD. He developed a large retroperitoneal hematoma in 8/16 and warfarin was stopped.  After a long hospitalization, he was discharged to [facility].    He has been getting HD 4 times a week due to volume overload.  He is starting to tolerate HD better with less nausea.  He is walking now.  Mild dyspnea with exertion.  No chest pain.    Labs (2/15): LDL 103 Labs (3/15): K 4.1, creatinine 1.5, BNP 164 Labs (4/15): K 3.5, creatinine 3.3 => 1.9 => 2.1, BNP 96 Labs (4/15): K 4.3, creatinine 1.43, glucose 308 Labs (5/15): K 5.1 Creatinine 1.35  Labs (8/15): K 5 => 4.7 => 3.8, creatinine 1.19 => 1.57 => 1.6, digoxin 1.1 Labs (9/15): K 4.6, creatinine 1.32 => 1.9 => 1.1, digoxin 0.6 => 1.2 (not trough) Labs (10/15): K 4.5, creatinine 1.36, pro-BNP 1261 Labs (12/15): TSH 8.6 (elevated), free T3/free T4 normal, K 4.5, creatinine 1.71, digoxin 0.6, HCT 44.1, AST 38, ALT 44, TSH normal Labs (1/16): K 5.1, creatinine 1.96, LFTs normal, BNP 952 Labs (3/16): K 4.6, creatinine 2.06, LFTs normal, digoxin 1.8 (decreased digoxin to 0.0625)  Labs (4/16): K 3.5, creatinine 1.83, LDL 37, HDL 28, HIV negative, lupus anticoagulant +.  Labs (5/16): K 3.3, creatinine 1.8, digoxin 0.4, HCT 34 Labs (6/16): K 4.7 => 5, creatinine 2.11 => 2.4, hgb 11.4   PMH: 1. Chronic systolic CHF: ? Nonischemic cardiomyopathy.  Most recent echo in 3/15 with EF 25-30%, moderate LV dilation, mild LVH, moderate diastolic dysfunction, IVC normal, mildly decreased RV systolic function.  Prior echo in 2011 also with EF 25-30%.  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  St Jude ICD. CPX  05/2013: FVC 3.89 (82%), FEV1 2.99 (83%), FEV1/FVC 77%, Peak HR 118 (74% predicted max), Peak VO2 12.4 (51% of predicted; when corrected to IBW =17.7), VE/VCO2 29.9, OUES 1.39, Peak RER 1.21.  RHC (8/15) with mean RA 6, PA 60/21 mean 36, mean PCWP 21, CI 1.81, PVR 3.6 WU.  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  Echo (12/15) with EF 20-25%, inferior akinesis, normal RV size and systolic function.  RHC (3/16) with mean RA 7, PA 76/24 mean 43, mean PCWP 25, CI 1.75, PVR 4.62. CPX (4/16) with RER 0.99, peak VO2 7.1, slope 36 => severely decreased functional capacity though submaximal. Home milrinone at 0.25 mcg/kg/min begun 4/16, increased to 0.375 in 5/16.  LHC/RHC (5/16) with nonobstructive CAD, mean RA 17, PA 102/37 mean 60, mean PCWP 37, CI 1.95 Fick/2.59 thermo, PVR 5 WU.  RHC (7/16) with mean RA 14, PA 91/34 mean 51, mean PCWP 27, CI 2.18 Fick/1.92 thermo, PVR 5.7 WU.   2. Type II diabetes 3. ESRD on HD 4. Monoclonal gammopathy of uncertain etiology.  5. CVA 2007 6. Degenerative disc disease: Chronic low back pain.   7. Pituitary microadenoma: Nonfunctioning, s/p surgery in 2009.  8. OSA: Complex, awaiting Bipap titration. 9. Depression 10. Hypothyroidism 11. Hyperlipidemia 12. Atrial fibrillation:   Patient is on Xarelto. DCCV 02/14/14 to NSR.  13. 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  Saw Dr Lindi Adie with hematology.  15. Spontaneous RP hematoma 8/16 16. Left femoral fracture 7/16 s/p repair.   SH: Married, former cigar smoker, unemployed.   FH: Father with MI x 2 and CHF, strong history of CAD on his father's side.   ROS: All systems reviewed and negative except as per HPI.   Current Outpatient Prescriptions  Medication Sig Dispense Refill  . ALPRAZolam (XANAX) 1 MG tablet Take 1 tablet (1 mg total) by mouth 2 (two) times daily as needed for anxiety  (be careful with extra sedation/somnolence). 60 tablet 0  . amiodarone (PACERONE) 400 MG tablet Take 0.5 tablets (200 mg total) by mouth daily. 60 tablet 6  . atorvastatin (LIPITOR) 40 MG tablet TAKE 1 TABLET DAILY 90 tablet 3  . bisacodyl (DULCOLAX) 10 MG suppository Place 1 suppository (10 mg total) rectally daily as needed for moderate constipation. 12 suppository 0  . bisacodyl (DULCOLAX) 5 MG EC tablet Take 5 mg by mouth at bedtime.    . chlorhexidine (PERIDEX) 0.12 % solution Perform mouth rinses twice daily after breakfast and at bedtime. (Patient taking differently: Use as directed in the mouth or throat 2 (two) times daily. Perform mouth rinses twice daily after breakfast and at bedtime (swish and spit)) 120 mL 0  . DULoxetine (CYMBALTA) 30 MG capsule Take 60 mg by mouth 2 (two) times daily.     [name] gabapentin (NEURONTIN) 100 MG capsule Take 200 mg by mouth at bedtime.    [name]  insulin glargine (LANTUS) 100 unit/mL SOPN Inject 30 Units into the skin at bedtime.    . insulin lispro (HUMALOG KWIKPEN) 100 UNIT/ML KiwkPen Inject 30 Units into the skin 3 (three) times daily before meals.    [name] levothyroxine (SYNTHROID, LEVOTHROID) 75 MCG tablet TAKE 1 TABLET ON AN EMPTY STOMACH 30 MINUTES BEFORE BREAKFAST  5  . Melatonin 10 MG TABS Take 10 mg by mouth at bedtime as needed (sleep).     . midodrine (PROAMATINE) 10 MG tablet Take 1 tablet (10 mg total) by mouth 3 (three) times daily. 90 tablet 6  . multivitamin (RENA-VIT) TABS tablet Take 1 tablet by mouth at bedtime. 30 tablet 6  . ondansetron (ZOFRAN) 4 MG tablet Take 1 tablet (4 mg total) by mouth every 8 (eight) hours as needed for nausea or vomiting. 30 tablet 0  . oxyCODONE-acetaminophen (PERCOCET) 10-325 MG per tablet Take 1 tablet by mouth every 4 (four) hours as needed for pain. 120 tablet 0  . polyethylene glycol (MIRALAX / GLYCOLAX) packet Take 17 g by mouth daily as needed for moderate constipation. 14 each 0  . sildenafil (REVATIO) 20 MG  tablet Take 4 tablets (80 mg total) by mouth 3 (three) times daily. 360 tablet 1  . temazepam (RESTORIL) 7.5 MG capsule Take 1 capsule (7.5 mg total) by mouth at bedtime as needed for sleep. 30 capsule 0   No current facility-administered medications for this encounter.    There were no vitals filed for this visit. General: NAD Wife present  Neck: Thick, JVP 8-9 cm. No thyromegaly or thyroid nodule.  Lungs: Clear to auscultation bilaterally with normal respiratory effort. CV: Lateral PMI.  Heart regular S1/S2, no S3/S4, no murmur.  2+ edema to knees bilaterally.  No carotid bruit.  Normal pedal pulses.  Abdomen: Obese, Soft, tender to palpation, obese, no hepatosplenomegaly, non-distended Skin: Intact without lesions or rashes.  Neurologic: Alert and oriented x 3.  Psych: Flat affect. Extremities: No clubbing or cyanosis.   Assessment/plan: 1. Chronic systolic CHF:  Nonischemic cardiomyopathy x years.  EF 20-25% with normal RV size and systolic function on 78/93 echo. Has St Jude ICD and has wide QRS (166 msec today) but not true LBBB pattern so suspect benefit from CRT upgrade would not be marked.  CPX in 4/16 showed severe functional impairment but was submaximal.  He was started on milrinone gtt in 4/16 but remained symptomatic.  He returned in 5/16 for RHC/LHC.  This showed nonobstructive CAD, severely elevated PCWP, and severe pulmonary hypertension (again, mixed PAH/PVH).  Milrinone was increased, Revatio was started.  Repeat RHC in 7/16 was similar to 5/16.  Unfortunately, he fractured his femur then developed ESRD.  He was able to come off inotropes but LVAD is no longer an option for him.   - He is volume overloaded today => needs more fluid pulled off with HD. - Still on midodrine.  2. ESRD: Tolerating HD better, starting to be able to pull more fluid.  Still going to HD 4 days/week.    3. Atrial fibrillation: Paroxysmal.  He is in NSR.   - Can decrease amiodarone to 200 mg daily.   Check LFTs/TSH today.  He will need a regular eye exam.  - I am going to have him restart coumadin, aiming for INR 2-2.5 (had RP hematoma about 6 wks ago).   4. OSA: Complex.  Per pulmonary.  5. 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  Continue statin.  6. 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  PVR 5.7 on RHC in 7/16.  He is now on Revatio.     Loralie Champagne  10/30/2014

## 2014-11-04 ENCOUNTER — Telehealth (HOSPITAL_COMMUNITY): Payer: Self-pay | Admitting: *Deleted

## 2014-11-04 NOTE — Telephone Encounter (Signed)
-----   Message from [name], MD sent at 11/01/2014 10:16 AM EDT ----- Stable.  Forward TSH to nursing home MD to address (has history of hypothyroidism).

## 2014-11-04 NOTE — Telephone Encounter (Signed)
Lab results faxed to nursing home to address [facility]

## 2014-11-12 ENCOUNTER — Encounter: Payer: Self-pay | Admitting: Vascular Surgery

## 2014-11-13 ENCOUNTER — Encounter (HOSPITAL_COMMUNITY): Payer: Self-pay | Admitting: *Deleted

## 2014-11-13 ENCOUNTER — Emergency Department (HOSPITAL_COMMUNITY): Payer: Commercial Managed Care - HMO

## 2014-11-13 ENCOUNTER — Inpatient Hospital Stay (HOSPITAL_COMMUNITY)
Admission: EM | Admit: 2014-11-13 | Discharge: 2014-11-22 | DRG: 199 | Disposition: A | Discharge status: Skilled Nursing Facility | Payer: Commercial Managed Care - HMO | Attending: Family Medicine | Admitting: Family Medicine

## 2014-11-13 DIAGNOSIS — N19 Unspecified kidney failure: Secondary | ICD-10-CM

## 2014-11-13 DIAGNOSIS — E039 Hypothyroidism, unspecified: Secondary | ICD-10-CM | POA: Diagnosis present

## 2014-11-13 DIAGNOSIS — E1142 Type 2 diabetes mellitus with diabetic polyneuropathy: Secondary | ICD-10-CM | POA: Diagnosis present

## 2014-11-13 DIAGNOSIS — J90 Pleural effusion, not elsewhere classified: Secondary | ICD-10-CM

## 2014-11-13 DIAGNOSIS — J942 Hemothorax: Secondary | ICD-10-CM

## 2014-11-13 DIAGNOSIS — E785 Hyperlipidemia, unspecified: Secondary | ICD-10-CM | POA: Diagnosis present

## 2014-11-13 DIAGNOSIS — M898X9 Other specified disorders of bone, unspecified site: Secondary | ICD-10-CM | POA: Diagnosis present

## 2014-11-13 DIAGNOSIS — Y95 Nosocomial condition: Secondary | ICD-10-CM | POA: Diagnosis present

## 2014-11-13 DIAGNOSIS — J9383 Other pneumothorax: Secondary | ICD-10-CM | POA: Diagnosis present

## 2014-11-13 DIAGNOSIS — J189 Pneumonia, unspecified organism: Secondary | ICD-10-CM | POA: Diagnosis present

## 2014-11-13 DIAGNOSIS — S271XXA Traumatic hemothorax, initial encounter: Principal | ICD-10-CM | POA: Diagnosis present

## 2014-11-13 DIAGNOSIS — G9340 Encephalopathy, unspecified: Secondary | ICD-10-CM | POA: Diagnosis present

## 2014-11-13 DIAGNOSIS — N186 End stage renal disease: Secondary | ICD-10-CM | POA: Diagnosis present

## 2014-11-13 DIAGNOSIS — Z992 Dependence on renal dialysis: Secondary | ICD-10-CM

## 2014-11-13 DIAGNOSIS — Z66 Do not resuscitate: Secondary | ICD-10-CM | POA: Diagnosis present

## 2014-11-13 DIAGNOSIS — R760 Raised antibody titer: Secondary | ICD-10-CM | POA: Diagnosis present

## 2014-11-13 DIAGNOSIS — Z794 Long term (current) use of insulin: Secondary | ICD-10-CM

## 2014-11-13 DIAGNOSIS — R001 Bradycardia, unspecified: Secondary | ICD-10-CM | POA: Diagnosis present

## 2014-11-13 DIAGNOSIS — I10 Essential (primary) hypertension: Secondary | ICD-10-CM | POA: Diagnosis present

## 2014-11-13 DIAGNOSIS — R410 Disorientation, unspecified: Secondary | ICD-10-CM | POA: Diagnosis not present

## 2014-11-13 DIAGNOSIS — E1149 Type 2 diabetes mellitus with other diabetic neurological complication: Secondary | ICD-10-CM | POA: Diagnosis present

## 2014-11-13 DIAGNOSIS — F329 Major depressive disorder, single episode, unspecified: Secondary | ICD-10-CM | POA: Diagnosis present

## 2014-11-13 DIAGNOSIS — I132 Hypertensive heart and chronic kidney disease with heart failure and with stage 5 chronic kidney disease, or end stage renal disease: Secondary | ICD-10-CM | POA: Diagnosis present

## 2014-11-13 DIAGNOSIS — I428 Other cardiomyopathies: Secondary | ICD-10-CM | POA: Diagnosis present

## 2014-11-13 DIAGNOSIS — I480 Paroxysmal atrial fibrillation: Secondary | ICD-10-CM | POA: Diagnosis present

## 2014-11-13 DIAGNOSIS — E113299 Type 2 diabetes mellitus with mild nonproliferative diabetic retinopathy without macular edema, unspecified eye: Secondary | ICD-10-CM | POA: Diagnosis present

## 2014-11-13 DIAGNOSIS — L89623 Pressure ulcer of left heel, stage 3: Secondary | ICD-10-CM | POA: Diagnosis present

## 2014-11-13 DIAGNOSIS — G4733 Obstructive sleep apnea (adult) (pediatric): Secondary | ICD-10-CM | POA: Diagnosis present

## 2014-11-13 DIAGNOSIS — J9610 Chronic respiratory failure, unspecified whether with hypoxia or hypercapnia: Secondary | ICD-10-CM | POA: Diagnosis present

## 2014-11-13 DIAGNOSIS — Z7901 Long term (current) use of anticoagulants: Secondary | ICD-10-CM

## 2014-11-13 DIAGNOSIS — Z8673 Personal history of transient ischemic attack (TIA), and cerebral infarction without residual deficits: Secondary | ICD-10-CM

## 2014-11-13 DIAGNOSIS — G629 Polyneuropathy, unspecified: Secondary | ICD-10-CM | POA: Diagnosis present

## 2014-11-13 DIAGNOSIS — F418 Other specified anxiety disorders: Secondary | ICD-10-CM | POA: Diagnosis present

## 2014-11-13 DIAGNOSIS — E1122 Type 2 diabetes mellitus with diabetic chronic kidney disease: Secondary | ICD-10-CM | POA: Diagnosis present

## 2014-11-13 DIAGNOSIS — Z79899 Other long term (current) drug therapy: Secondary | ICD-10-CM

## 2014-11-13 DIAGNOSIS — Z515 Encounter for palliative care: Secondary | ICD-10-CM

## 2014-11-13 DIAGNOSIS — S272XXA Traumatic hemopneumothorax, initial encounter: Secondary | ICD-10-CM

## 2014-11-13 DIAGNOSIS — D352 Benign neoplasm of pituitary gland: Secondary | ICD-10-CM | POA: Diagnosis present

## 2014-11-13 DIAGNOSIS — R109 Unspecified abdominal pain: Secondary | ICD-10-CM | POA: Diagnosis present

## 2014-11-13 DIAGNOSIS — I5023 Acute on chronic systolic (congestive) heart failure: Secondary | ICD-10-CM | POA: Diagnosis present

## 2014-11-13 DIAGNOSIS — I2510 Atherosclerotic heart disease of native coronary artery without angina pectoris: Secondary | ICD-10-CM | POA: Diagnosis present

## 2014-11-13 DIAGNOSIS — L989 Disorder of the skin and subcutaneous tissue, unspecified: Secondary | ICD-10-CM

## 2014-11-13 DIAGNOSIS — K5900 Constipation, unspecified: Secondary | ICD-10-CM | POA: Diagnosis present

## 2014-11-13 DIAGNOSIS — Z9689 Presence of other specified functional implants: Secondary | ICD-10-CM

## 2014-11-13 DIAGNOSIS — S2239XA Fracture of one rib, unspecified side, initial encounter for closed fracture: Secondary | ICD-10-CM | POA: Diagnosis present

## 2014-11-13 DIAGNOSIS — Z95810 Presence of automatic (implantable) cardiac defibrillator: Secondary | ICD-10-CM | POA: Diagnosis present

## 2014-11-13 DIAGNOSIS — E119 Type 2 diabetes mellitus without complications: Secondary | ICD-10-CM

## 2014-11-13 DIAGNOSIS — Z888 Allergy status to other drugs, medicaments and biological substances status: Secondary | ICD-10-CM

## 2014-11-13 DIAGNOSIS — D539 Nutritional anemia, unspecified: Secondary | ICD-10-CM | POA: Diagnosis present

## 2014-11-13 DIAGNOSIS — E7800 Pure hypercholesterolemia, unspecified: Secondary | ICD-10-CM | POA: Diagnosis present

## 2014-11-13 DIAGNOSIS — S2241XA Multiple fractures of ribs, right side, initial encounter for closed fracture: Secondary | ICD-10-CM | POA: Diagnosis present

## 2014-11-13 DIAGNOSIS — Z9981 Dependence on supplemental oxygen: Secondary | ICD-10-CM

## 2014-11-13 DIAGNOSIS — Z87891 Personal history of nicotine dependence: Secondary | ICD-10-CM

## 2014-11-13 DIAGNOSIS — I272 Other secondary pulmonary hypertension: Secondary | ICD-10-CM | POA: Diagnosis present

## 2014-11-13 DIAGNOSIS — R296 Repeated falls: Secondary | ICD-10-CM | POA: Diagnosis present

## 2014-11-13 DIAGNOSIS — I5022 Chronic systolic (congestive) heart failure: Secondary | ICD-10-CM | POA: Diagnosis present

## 2014-11-13 DIAGNOSIS — E1151 Type 2 diabetes mellitus with diabetic peripheral angiopathy without gangrene: Secondary | ICD-10-CM | POA: Diagnosis present

## 2014-11-13 DIAGNOSIS — S2249XA Multiple fractures of ribs, unspecified side, initial encounter for closed fracture: Secondary | ICD-10-CM | POA: Diagnosis present

## 2014-11-13 DIAGNOSIS — Z9849 Cataract extraction status, unspecified eye: Secondary | ICD-10-CM

## 2014-11-13 DIAGNOSIS — D649 Anemia, unspecified: Secondary | ICD-10-CM

## 2014-11-13 DIAGNOSIS — I9589 Other hypotension: Secondary | ICD-10-CM | POA: Diagnosis present

## 2014-11-13 HISTORY — DX: Personal history of other medical treatment: Z92.89

## 2014-11-13 HISTORY — DX: Dependence on renal dialysis: Z99.2

## 2014-11-13 HISTORY — DX: Unspecified osteoarthritis, unspecified site: M19.90

## 2014-11-13 HISTORY — DX: End stage renal disease: N18.6

## 2014-11-13 HISTORY — DX: Chronic obstructive pulmonary disease, unspecified: J44.9

## 2014-11-13 HISTORY — DX: Anemia, unspecified: D64.9

## 2014-11-13 HISTORY — DX: Acute myocardial infarction, unspecified: I21.9

## 2014-11-13 HISTORY — DX: Claustrophobia: F40.240

## 2014-11-13 HISTORY — DX: Chronic kidney disease, stage 4 (severe): N18.4

## 2014-11-13 LAB — CBG MONITORING, ED: Glucose-Capillary: 130 mg/dL — ABNORMAL HIGH (ref 65–99)

## 2014-11-13 NOTE — ED Notes (Signed)
Pt has been altered for a few days. Pt is a dialysis pt and goes MWF&S. Has a hemodialysis cath. Pt. Still makes urine and hasnt urinated in 2 days. Pt. Has a pacemaker/ICD. History of stroke. Pt. Is usually A&Ox4 and is only alert to self and wife. Pt. Is from [place] home

## 2014-11-13 NOTE — ED Provider Notes (Signed)
CSN: [id number]     Arrival date & time 11/13/14  2248 History  By signing my name below, I, Larry [name], attest that this documentation has been prepared under the direction and in the presence of [name], MD . Electronically Signed: Evelene [name], Scribe. 11/14/2014. 1:20 AM.     Chief Complaint  Patient presents with  . Altered Mental Status  LEVEL 5 CAVEAT DUE TO altered mental status  The history is provided by the patient and the spouse. No language interpreter was used.   HPI Comments:  Larry [name] is a 64 y.o. male with an extensive medical history including: CAD, CVA, and AICD, who presents to the Emergency Department from nursing home for confusion. Per wife pt has been acting more altered for the last 5 days. At this time pt complains of mild abdominal pain. He denies fever, HA, and CP; denies pain at this time. Pt has a history of CKD and his last dialysis session was today. Wife also reports decreased urination. Unable to fully obtain HPI/ROS due to pt's mental status. He has had multiple falls within the past several weeks   Past Medical History  Diagnosis Date  . NICM (nonischemic cardiomyopathy) ([place])     a. NICM. EF 2011 25-30%. b. LHC 2009 nonobstructive CAD. c. h/o St. Jude ICD 2012. d. Echo 04/2013 - EF 25-30% with mildly decreased RV systolic function. e. Echo 01/2014: EF 20-25%, inf akinesis, RHC. e. Home milrinone started 4/16.  [name] CAD (coronary artery disease)     a. nonobstructive CAD by cath 2009 & 06/2014.  . Obesity   . CVA (cerebral vascular accident) ([place])     CVA 2007 without residual deficit  . Pituitary tumor ([place])      (nonfunctionging pituitary microadenoma) s/p gamma knife surgery at [facility] 2009 with neuropathy and retinopathy  . Depression   . Erectile dysfunction   . DDD (degenerative disc disease)     Chronic low back pain.   . OSA (obstructive sleep apnea)     a. 4/16 sleep study with severe OSA  . Monoclonal gammopathy   .  CKD (chronic kidney disease), stage III   . 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 Polycythemia, secondary     improved/resolved  . Hypercholesterolemia   . Back pain     F/B [name]  . Monoclonal gammopathy of unknown significance     per [name]  . Neck pain     F/B [name]  . PAF (paroxysmal atrial fibrillation) ([place]) 06/04/10    a. On Xarelto. b. DCCV 02/2014, 04/2014 to NSR.  [name] History of diverticulitis of colon   . Type II or unspecified type diabetes mellitus with neurological manifestations, uncontrolled   . Nonproliferative diabetic retinopathy NOS(362.03)   . Ventricular tachycardia ([place]) 10/25/13    appropriate ICD shock, VT CL 230-240 msec  . Confusion 02/01/2014  . Hypertension   . AICD (automatic cardioverter/defibrillator) present   . Anxiety   . Peripheral vascular disease ([place])     2007  . Hypothyroidism   . PTSD (post-traumatic stress disorder)     a. Anxiety/PTSD from ICD shock  . Lupus anticoagulant positive     a. No h/o DVT. Saw Dr Lindi Adie with hematology.   . Pulmonary hypertension ([place])     a. Mixed pulmonary venous hypertension and PAH  . Chronic respiratory failure ([place])     a. As of 08/2014 requiring home O2  with ambulation due to desat.   Past Surgical History  Procedure Laterality Date  . Gamma knife surgery for pituitary tumor    . Tonsillectomy    . Cardiac defibrillator placement  02/19/10    By JA.   . Right heart catheterization N/A 09/17/2013    Procedure: RIGHT HEART CATH;  Surgeon: [name], MD;  Location: [facility] CATH LAB;  Service: Cardiovascular;  Laterality: N/A;  . Cardiac catheterization    . Brain surgery    . Insert / replace / remove pacemaker    . Cardioversion N/A 02/14/2014    Procedure: CARDIOVERSION;  Surgeon: [name], MD;  Location: [address];  Service: Cardiovascular;  Laterality: N/A;  . Cardioversion N/A 05/02/2014    Procedure: CARDIOVERSION;  Surgeon: [name], MD;  Location: [facility] ENDOSCOPY;  Service: Cardiovascular;  Laterality: N/A;  . Right heart catheterization N/A 05/08/2014    Procedure: RIGHT HEART CATH;  Surgeon: [name], MD;  Location: [facility] CATH LAB;  Service: Cardiovascular;  Laterality: N/A;  . Cardiac catheterization N/A 06/26/2014    Procedure: Right/Left Heart Cath And Coronary Angiography;  Surgeon: [name], MD;  Location: [place] CV LAB;  Service: Cardiovascular;  Laterality: N/A;  . Cardiac catheterization N/A 08/21/2014    Procedure: Right Heart Cath;  Surgeon: [name], MD;  Location: [place] CV LAB;  Service: Cardiovascular;  Laterality: N/A;  . Multiple extractions with alveoloplasty N/A 08/22/2014    Procedure: Extraction of toothy #'s 3,5,6,7,9,10,11,12,13,14,20,21,22,23,27,28,29,30 with alveoloplasty and bilateral mandibular tori reductions;  Surgeon: Lenn Cal, DDS;  Location: [place];  Service: Oral Surgery;  Laterality: N/A;  . Cataract extraction    . Femur im nail Left 08/26/2014    Procedure: INTRAMEDULLARY (IM) NAIL FEMORAL;  Surgeon: [name], MD;  Location: [place];  Service: Orthopedics;  Laterality: Left;  . Av fistula placement Right 10/01/2014    Procedure: Creation of  Right Arm  ARTERIOVENOUS Fistula;  Surgeon: [name], MD;  Location: [facility] OR;  Service: Vascular;  Laterality: Right;   Family History  Problem Relation Age of Onset  . Lung cancer    . Cancer Mother     skin  . Dementia Mother   . Depression Mother   . Heart disease Father 61  . Hypertension Father   . Lung cancer Father   . Obesity Father   . Obesity Sister   . Hypertension Sister   . Diabetes Brother   . Hypertension Brother   . Heart disease Brother 26    Died of stroke MI age 39  . Obesity Brother   . Lung cancer Paternal Uncle   . Diabetes Paternal Uncle     requiring leg amputations    Social History  Substance Use Topics  . Smoking status: Former Smoker    Types: Cigars     Quit date: 02/09/1983  . Smokeless tobacco: Never Used     Comment: 25 yrs ago  . Alcohol Use: No    Review of Systems  Unable to perform ROS: Mental status change   Allergies  Bydureon; Losartan potassium; and Gabapentin  Home Medications   Prior to Admission medications   Medication Sig Start Date End Date Taking? Authorizing Provider  acetaminophen (TYLENOL) 325 MG tablet Take 650 mg by mouth every 4 (four) hours as needed for moderate pain or fever.   Yes Historical Provider, MD  ALPRAZolam Duanne Moron) 0.5 MG tablet Take 0.5 mg by  mouth every 8 (eight) hours as needed for anxiety.   Yes Historical Provider, MD  amiodarone (PACERONE) 400 MG tablet Take 0.5 tablets (200 mg total) by mouth daily. 10/29/14  Yes [name], MD  atorvastatin (LIPITOR) 40 MG tablet TAKE 1 TABLET DAILY 03/18/14  Yes [name], MD  bisacodyl (DULCOLAX) 5 MG EC tablet Take 5 mg by mouth at bedtime.   Yes Historical Provider, MD  bisacodyl (DULCOLAX) 5 MG EC tablet Take 5-10 mg by mouth at bedtime. Take 5mg  at bedtime, take 10 mg at bedtime if no bowel movement during the day.   Yes Historical Provider, MD  chlorhexidine (PERIDEX) 0.12 % solution Perform mouth rinses twice daily after breakfast and at bedtime. Patient taking differently: Use as directed in the mouth or throat 2 (two) times daily. Perform mouth rinses twice daily after breakfast and at bedtime (swish and spit) 08/24/14  Yes Dayna N Dunn, PA-C  DULoxetine (CYMBALTA) 30 MG capsule Take 60 mg by mouth 2 (two) times daily.    Yes Historical Provider, MD  insulin glargine (LANTUS) 100 unit/mL SOPN Inject 15 Units into the skin at bedtime.    Yes Historical Provider, MD  insulin lispro (HUMALOG KWIKPEN) 100 UNIT/ML KiwkPen Inject 5-25 Units into the skin 3 (three) times daily as needed.    Yes Historical Provider, MD  levothyroxine (SYNTHROID, LEVOTHROID) 75 MCG tablet TAKE 1 TABLET ON AN EMPTY STOMACH 30 MINUTES BEFORE BREAKFAST 07/13/14  Yes  Historical Provider, MD  midodrine (PROAMATINE) 10 MG tablet Take 1 tablet (10 mg total) by mouth 3 (three) times daily. 10/11/14  Yes Amy D Clegg, NP  multivitamin (RENA-VIT) TABS tablet Take 1 tablet by mouth at bedtime. 10/11/14  Yes Amy D Clegg, NP  polyethylene glycol (MIRALAX / GLYCOLAX) packet Take 17 g by mouth daily as needed for moderate constipation. 10/11/14  Yes Amy D Clegg, NP  sevelamer carbonate (RENVELA) 800 MG tablet Take 800 mg by mouth 3 (three) times daily with meals.   Yes Historical Provider, MD  sildenafil (REVATIO) 20 MG tablet Take 4 tablets (80 mg total) by mouth 3 (three) times daily. 08/24/14  Yes Dayna N Dunn, PA-C  traZODone (DESYREL) 50 MG tablet Take 50 mg by mouth at bedtime.   Yes Historical Provider, MD  warfarin (COUMADIN) 5 MG tablet Take 5 mg by mouth daily.   Yes Historical Provider, MD  ALPRAZolam Duanne Moron) 1 MG tablet Take 1 tablet (1 mg total) by mouth 2 (two) times daily as needed for anxiety (be careful with extra sedation/somnolence). Patient not taking: Reported on 11/13/2014 10/11/14   Amy D Clegg, NP  bisacodyl (DULCOLAX) 10 MG suppository Place 1 suppository (10 mg total) rectally daily as needed for moderate constipation. 10/11/14   Amy D Clegg, NP  ondansetron (ZOFRAN) 4 MG tablet Take 1 tablet (4 mg total) by mouth every 8 (eight) hours as needed for nausea or vomiting. 10/11/14   Amy D Ninfa Meeker, NP  oxyCODONE-acetaminophen (PERCOCET) 10-325 MG per tablet Take 1 tablet by mouth every 4 (four) hours as needed for pain. Patient taking differently: Take 1-2 tablets by mouth every 4 (four) hours as needed for pain.  10/11/14   Amy D Clegg, NP  temazepam (RESTORIL) 7.5 MG capsule Take 1 capsule (7.5 mg total) by mouth at bedtime as needed for sleep. Patient not taking: Reported on 11/13/2014 10/11/14   Amy D Clegg, NP   BP 94/37 mmHg  Pulse 73  Temp(Src) 98.2 F (36.8 C) (  Oral)  Resp 16  SpO2 100% Physical Exam CONSTITUTIONAL: Ill-appearing, elderly  HEAD:  Normocephalic/atraumatic EYES: EOMI/PERRL ENMT: Mucous membranes moist NECK: supple no meningeal signs SPINE/BACK:entire spine nontender CV: S1/S2 noted, bradycardic  LUNGS: Decreased breath sounds bilaterally ABDOMEN: soft, nontender, no rebound or guarding, bowel sounds noted throughout abdomen GU:no cva tenderness NEURO: Pt is awake/alert, moves all extremitiesx4.  No facial droop. Pt appears confused and frequently drifts asleep during exam   EXTREMITIES: pulses normal/equal, full ROM SKIN: warm, color normal; dialysis cath to right chest; chronic edema to lower extremities;  no open wounds to feet noted  PSYCH: unable to assess  ED Course  Procedures  Medications  piperacillin-tazobactam (ZOSYN) IVPB 3.375 g (3.375 g Intravenous New Bag/Given 11/14/14 0123)  vancomycin (VANCOCIN) IVPB 1000 mg/200 mL premix (not administered)  oxyCODONE-acetaminophen (PERCOCET/ROXICET) 5-325 MG per tablet 1 tablet (1 tablet Oral Given 11/14/14 0122)    DIAGNOSTIC STUDIES:  Oxygen Saturation is 95% on 4L [place], adequate by my interpretation.    11:41 PM Pt ill appearing He has multiple co-morbidities, including non-ischemic cardiomyopathy and renal failure Intermittent episodes of bradycardia and hypotension (manual BP at >85) Unclear cause of confusion and hypotension Labs/imaging ordered 1:19 AM Pt stabilized He is more alert at this time He has large right pleural effusion Due to recent confusion, possible infectious etiology, will add on antibiotics for HCAP Pt is not septic at this time Will admit 1:27 AM D/w dr Blaine Hamper Will admit Pt stable, no episodes of hypotension, will admit to tele for now   Labs Review Labs Reviewed  CBC WITH DIFFERENTIAL/PLATELET - Abnormal; Notable for the following:    RBC 2.87 (*)    Hemoglobin 9.1 (*)    HCT 29.2 (*)    MCV 101.7 (*)    RDW 19.5 (*)    All other components within normal limits  PROTIME-INR - Abnormal; Notable for the following:     Prothrombin Time 30.2 (*)    INR 2.94 (*)    All other components within normal limits  CBG MONITORING, ED - Abnormal; Notable for the following:    Glucose-Capillary 130 (*)    All other components within normal limits  I-STAT CHEM 8, ED - Abnormal; Notable for the following:    Sodium 133 (*)    Chloride 96 (*)    Creatinine, Ser 2.50 (*)    Glucose, Bld 126 (*)    Calcium, Ion 1.03 (*)    Hemoglobin 11.2 (*)    HCT 33.0 (*)    All other components within normal limits  I-STAT ARTERIAL BLOOD GAS, ED - Abnormal; Notable for the following:    pO2, Arterial 105.0 (*)    Bicarbonate 26.1 (*)    All other components within normal limits  CULTURE, BLOOD (ROUTINE X 2)  CULTURE, BLOOD (ROUTINE X 2)  URINE CULTURE  CULTURE, BLOOD (ROUTINE X 2)  CULTURE, BLOOD (ROUTINE X 2)  COMPREHENSIVE METABOLIC PANEL  URINALYSIS, ROUTINE W REFLEX MICROSCOPIC (NOT AT [facility])  TROPONIN I  I-STAT CG4 LACTIC ACID, ED    Imaging Review Ct Head Wo Contrast  11/14/2014   CLINICAL DATA:  Altered mental status.  EXAM: CT HEAD WITHOUT CONTRAST  TECHNIQUE: Contiguous axial images were obtained from the base of the skull through the vertex without intravenous contrast.  COMPARISON:  08/25/2014, 02/01/2014  FINDINGS: There is no intracranial hemorrhage or extra-axial fluid collection. There is no evidence of acute infarction. There is encephalomalacia due to remote right posterior MCA infarction.  There is mild generalized atrophy. There is mild periventricular hypodensity consistent with chronic small vessel disease. Prominent pituitary soft tissues are consistent with the previously described adenoma. Visible paranasal sinuses are clear.  IMPRESSION: 1. Remote right MCA infarction. 2. Unchanged pituitary adenoma. 3. Mild generalized atrophy and chronic small vessel disease. 4. No acute findings   Electronically Signed   By: Andreas Newport M.D.   On: 11/14/2014 00:53   Dg Chest Port 1 View  11/13/2014   CLINICAL  DATA:  Short of breath  EXAM: PORTABLE CHEST 1 VIEW  COMPARISON:  09/04/2014  FINDINGS: Tunneled right internal jugular dialysis catheter in place. Tip is at the cavoatrial junction. Right PICC is been removed. Left subclavian AICD device and leads are stable. Large right pleural effusion has developed. This obscures the right lower lung.  IMPRESSION: New large right pleural effusion.   Electronically Signed   By: Marybelle Killings M.D.   On: 11/13/2014 23:43   I have personally reviewed and evaluated these images and lab results as part of my medical decision-making.   EKG Interpretation   Date/Time:  Wednesday November 13 2014 22:56:38 EDT Ventricular Rate:  81 PR Interval:    QRS Duration: 163 QT Interval:  514 QTC Calculation: 597 R Axis:   -90 Text Interpretation:  indeterminate, suspect sinus rhythm Nonspecific IVCD  with LAD Abnormal lateral Q waves Anterior infarct, old artifact noted  Confirmed by Christy Gentles  MD, [place] ([id number]) on 11/13/2014 11:14:07 PM      MDM   Final diagnoses:  Delirium  Pleural effusion, right  Renal failure  Anemia, unspecified anemia type    Nursing notes including past medical history and social history reviewed and considered in documentation Previous records reviewed and considered xrays/imaging reviewed by myself and considered during evaluation Labs/vital reviewed myself and considered during evaluation    I, Sharyon Cable, personally performed the services described in this documentation. All medical record entries made by the scribe were at my direction and in my presence.  I have reviewed the chart and discharge instructions and agree that the record reflects my personal performance and is accurate and complete. Sharyon Cable.  11/14/2014. 1:19 AM.       [name], MD 11/14/14 [phone]

## 2014-11-14 ENCOUNTER — Encounter (HOSPITAL_COMMUNITY): Payer: Self-pay

## 2014-11-14 ENCOUNTER — Emergency Department (HOSPITAL_COMMUNITY): Payer: Commercial Managed Care - HMO

## 2014-11-14 ENCOUNTER — Inpatient Hospital Stay (HOSPITAL_COMMUNITY): Payer: Commercial Managed Care - HMO

## 2014-11-14 ENCOUNTER — Encounter: Payer: Self-pay | Admitting: Vascular Surgery

## 2014-11-14 ENCOUNTER — Telehealth (HOSPITAL_COMMUNITY): Payer: Self-pay | Admitting: Orthopedic Surgery

## 2014-11-14 DIAGNOSIS — R001 Bradycardia, unspecified: Secondary | ICD-10-CM | POA: Diagnosis present

## 2014-11-14 DIAGNOSIS — Z9981 Dependence on supplemental oxygen: Secondary | ICD-10-CM | POA: Diagnosis not present

## 2014-11-14 DIAGNOSIS — N186 End stage renal disease: Secondary | ICD-10-CM | POA: Diagnosis present

## 2014-11-14 DIAGNOSIS — Z992 Dependence on renal dialysis: Secondary | ICD-10-CM | POA: Diagnosis not present

## 2014-11-14 DIAGNOSIS — E785 Hyperlipidemia, unspecified: Secondary | ICD-10-CM | POA: Diagnosis present

## 2014-11-14 DIAGNOSIS — I5022 Chronic systolic (congestive) heart failure: Secondary | ICD-10-CM | POA: Diagnosis not present

## 2014-11-14 DIAGNOSIS — R760 Raised antibody titer: Secondary | ICD-10-CM | POA: Diagnosis not present

## 2014-11-14 DIAGNOSIS — K5900 Constipation, unspecified: Secondary | ICD-10-CM | POA: Diagnosis present

## 2014-11-14 DIAGNOSIS — G4733 Obstructive sleep apnea (adult) (pediatric): Secondary | ICD-10-CM | POA: Diagnosis present

## 2014-11-14 DIAGNOSIS — Z794 Long term (current) use of insulin: Secondary | ICD-10-CM | POA: Diagnosis not present

## 2014-11-14 DIAGNOSIS — Z888 Allergy status to other drugs, medicaments and biological substances status: Secondary | ICD-10-CM | POA: Diagnosis not present

## 2014-11-14 DIAGNOSIS — R1031 Right lower quadrant pain: Secondary | ICD-10-CM

## 2014-11-14 DIAGNOSIS — F329 Major depressive disorder, single episode, unspecified: Secondary | ICD-10-CM | POA: Diagnosis present

## 2014-11-14 DIAGNOSIS — E1122 Type 2 diabetes mellitus with diabetic chronic kidney disease: Secondary | ICD-10-CM | POA: Diagnosis present

## 2014-11-14 DIAGNOSIS — E1149 Type 2 diabetes mellitus with other diabetic neurological complication: Secondary | ICD-10-CM | POA: Diagnosis present

## 2014-11-14 DIAGNOSIS — L89623 Pressure ulcer of left heel, stage 3: Secondary | ICD-10-CM | POA: Diagnosis present

## 2014-11-14 DIAGNOSIS — Z87891 Personal history of nicotine dependence: Secondary | ICD-10-CM | POA: Diagnosis not present

## 2014-11-14 DIAGNOSIS — S2241XA Multiple fractures of ribs, right side, initial encounter for closed fracture: Secondary | ICD-10-CM | POA: Diagnosis present

## 2014-11-14 DIAGNOSIS — E1142 Type 2 diabetes mellitus with diabetic polyneuropathy: Secondary | ICD-10-CM | POA: Diagnosis present

## 2014-11-14 DIAGNOSIS — E113299 Type 2 diabetes mellitus with mild nonproliferative diabetic retinopathy without macular edema, unspecified eye: Secondary | ICD-10-CM | POA: Diagnosis present

## 2014-11-14 DIAGNOSIS — I272 Other secondary pulmonary hypertension: Secondary | ICD-10-CM | POA: Diagnosis present

## 2014-11-14 DIAGNOSIS — I10 Essential (primary) hypertension: Secondary | ICD-10-CM

## 2014-11-14 DIAGNOSIS — G9340 Encephalopathy, unspecified: Secondary | ICD-10-CM | POA: Diagnosis not present

## 2014-11-14 DIAGNOSIS — E118 Type 2 diabetes mellitus with unspecified complications: Secondary | ICD-10-CM

## 2014-11-14 DIAGNOSIS — D539 Nutritional anemia, unspecified: Secondary | ICD-10-CM | POA: Diagnosis present

## 2014-11-14 DIAGNOSIS — F418 Other specified anxiety disorders: Secondary | ICD-10-CM

## 2014-11-14 DIAGNOSIS — Z66 Do not resuscitate: Secondary | ICD-10-CM | POA: Diagnosis present

## 2014-11-14 DIAGNOSIS — E1151 Type 2 diabetes mellitus with diabetic peripheral angiopathy without gangrene: Secondary | ICD-10-CM | POA: Diagnosis present

## 2014-11-14 DIAGNOSIS — G629 Polyneuropathy, unspecified: Secondary | ICD-10-CM | POA: Diagnosis present

## 2014-11-14 DIAGNOSIS — I9589 Other hypotension: Secondary | ICD-10-CM | POA: Diagnosis present

## 2014-11-14 DIAGNOSIS — D649 Anemia, unspecified: Secondary | ICD-10-CM

## 2014-11-14 DIAGNOSIS — L989 Disorder of the skin and subcutaneous tissue, unspecified: Secondary | ICD-10-CM

## 2014-11-14 DIAGNOSIS — I428 Other cardiomyopathies: Secondary | ICD-10-CM | POA: Diagnosis present

## 2014-11-14 DIAGNOSIS — D352 Benign neoplasm of pituitary gland: Secondary | ICD-10-CM | POA: Diagnosis present

## 2014-11-14 DIAGNOSIS — S2239XA Fracture of one rib, unspecified side, initial encounter for closed fracture: Secondary | ICD-10-CM | POA: Diagnosis present

## 2014-11-14 DIAGNOSIS — J9610 Chronic respiratory failure, unspecified whether with hypoxia or hypercapnia: Secondary | ICD-10-CM | POA: Diagnosis present

## 2014-11-14 DIAGNOSIS — Z9849 Cataract extraction status, unspecified eye: Secondary | ICD-10-CM | POA: Diagnosis not present

## 2014-11-14 DIAGNOSIS — J189 Pneumonia, unspecified organism: Secondary | ICD-10-CM | POA: Diagnosis present

## 2014-11-14 DIAGNOSIS — Z7901 Long term (current) use of anticoagulants: Secondary | ICD-10-CM | POA: Diagnosis not present

## 2014-11-14 DIAGNOSIS — S271XXA Traumatic hemothorax, initial encounter: Secondary | ICD-10-CM | POA: Diagnosis present

## 2014-11-14 DIAGNOSIS — I5023 Acute on chronic systolic (congestive) heart failure: Secondary | ICD-10-CM | POA: Diagnosis present

## 2014-11-14 DIAGNOSIS — Z8673 Personal history of transient ischemic attack (TIA), and cerebral infarction without residual deficits: Secondary | ICD-10-CM | POA: Diagnosis not present

## 2014-11-14 DIAGNOSIS — J90 Pleural effusion, not elsewhere classified: Secondary | ICD-10-CM | POA: Diagnosis present

## 2014-11-14 DIAGNOSIS — R109 Unspecified abdominal pain: Secondary | ICD-10-CM | POA: Diagnosis present

## 2014-11-14 DIAGNOSIS — I2510 Atherosclerotic heart disease of native coronary artery without angina pectoris: Secondary | ICD-10-CM | POA: Diagnosis present

## 2014-11-14 DIAGNOSIS — S2231XA Fracture of one rib, right side, initial encounter for closed fracture: Secondary | ICD-10-CM

## 2014-11-14 DIAGNOSIS — I132 Hypertensive heart and chronic kidney disease with heart failure and with stage 5 chronic kidney disease, or end stage renal disease: Secondary | ICD-10-CM | POA: Diagnosis present

## 2014-11-14 DIAGNOSIS — R296 Repeated falls: Secondary | ICD-10-CM | POA: Diagnosis present

## 2014-11-14 DIAGNOSIS — E039 Hypothyroidism, unspecified: Secondary | ICD-10-CM | POA: Diagnosis present

## 2014-11-14 DIAGNOSIS — S2249XA Multiple fractures of ribs, unspecified side, initial encounter for closed fracture: Secondary | ICD-10-CM | POA: Diagnosis present

## 2014-11-14 DIAGNOSIS — Y95 Nosocomial condition: Secondary | ICD-10-CM | POA: Diagnosis present

## 2014-11-14 DIAGNOSIS — R410 Disorientation, unspecified: Secondary | ICD-10-CM | POA: Diagnosis present

## 2014-11-14 DIAGNOSIS — J9383 Other pneumothorax: Secondary | ICD-10-CM | POA: Diagnosis present

## 2014-11-14 DIAGNOSIS — Z79899 Other long term (current) drug therapy: Secondary | ICD-10-CM | POA: Diagnosis not present

## 2014-11-14 DIAGNOSIS — J942 Hemothorax: Secondary | ICD-10-CM

## 2014-11-14 DIAGNOSIS — E7800 Pure hypercholesterolemia, unspecified: Secondary | ICD-10-CM | POA: Diagnosis present

## 2014-11-14 DIAGNOSIS — Z95810 Presence of automatic (implantable) cardiac defibrillator: Secondary | ICD-10-CM | POA: Diagnosis not present

## 2014-11-14 DIAGNOSIS — M898X9 Other specified disorders of bone, unspecified site: Secondary | ICD-10-CM | POA: Diagnosis present

## 2014-11-14 DIAGNOSIS — I480 Paroxysmal atrial fibrillation: Secondary | ICD-10-CM | POA: Diagnosis not present

## 2014-11-14 LAB — CBC
HCT: 28 % — ABNORMAL LOW (ref 39.0–52.0)
Hemoglobin: 8.9 g/dL — ABNORMAL LOW (ref 13.0–17.0)
MCH: 32.4 pg (ref 26.0–34.0)
MCHC: 31.8 g/dL (ref 30.0–36.0)
MCV: 101.8 fL — ABNORMAL HIGH (ref 78.0–100.0)
PLATELETS: 204 10*3/uL (ref 150–400)
RBC: 2.75 MIL/uL — ABNORMAL LOW (ref 4.22–5.81)
RDW: 20 % — AB (ref 11.5–15.5)
WBC: 5.9 10*3/uL (ref 4.0–10.5)

## 2014-11-14 LAB — CBC WITH DIFFERENTIAL/PLATELET
Basophils Absolute: 0.1 10*3/uL (ref 0.0–0.1)
Basophils Relative: 1 %
EOS PCT: 3 %
Eosinophils Absolute: 0.2 10*3/uL (ref 0.0–0.7)
HCT: 29.2 % — ABNORMAL LOW (ref 39.0–52.0)
Hemoglobin: 9.1 g/dL — ABNORMAL LOW (ref 13.0–17.0)
LYMPHS ABS: 0.7 10*3/uL (ref 0.7–4.0)
LYMPHS PCT: 12 %
MCH: 31.7 pg (ref 26.0–34.0)
MCHC: 31.2 g/dL (ref 30.0–36.0)
MCV: 101.7 fL — AB (ref 78.0–100.0)
MONO ABS: 0.9 10*3/uL (ref 0.1–1.0)
Monocytes Relative: 15 %
Neutro Abs: 4.1 10*3/uL (ref 1.7–7.7)
Neutrophils Relative %: 69 %
PLATELETS: 228 10*3/uL (ref 150–400)
RBC: 2.87 MIL/uL — ABNORMAL LOW (ref 4.22–5.81)
RDW: 19.5 % — AB (ref 11.5–15.5)
WBC: 6 10*3/uL (ref 4.0–10.5)

## 2014-11-14 LAB — COMPREHENSIVE METABOLIC PANEL
ALBUMIN: 2.9 g/dL — AB (ref 3.5–5.0)
ALT: 214 U/L — AB (ref 17–63)
ALT: 236 U/L — AB (ref 17–63)
ANION GAP: 11 (ref 5–15)
AST: 275 U/L — ABNORMAL HIGH (ref 15–41)
AST: 329 U/L — AB (ref 15–41)
Albumin: 2.7 g/dL — ABNORMAL LOW (ref 3.5–5.0)
Alkaline Phosphatase: 119 U/L (ref 38–126)
Alkaline Phosphatase: 108 U/L (ref 38–126)
Anion gap: 11 (ref 5–15)
BILIRUBIN TOTAL: 0.8 mg/dL (ref 0.3–1.2)
BUN: 13 mg/dL (ref 6–20)
BUN: 12 mg/dL (ref 6–20)
CO2: 26 mmol/L (ref 22–32)
CO2: 26 mmol/L (ref 22–32)
CREATININE: 2.75 mg/dL — AB (ref 0.61–1.24)
CREATININE: 2.67 mg/dL — AB (ref 0.61–1.24)
Calcium: 8.3 mg/dL — ABNORMAL LOW (ref 8.9–10.3)
Calcium: 8.1 mg/dL — ABNORMAL LOW (ref 8.9–10.3)
Chloride: 93 mmol/L — ABNORMAL LOW (ref 101–111)
Chloride: 93 mmol/L — ABNORMAL LOW (ref 101–111)
GFR calc Af Amer: 28 mL/min — ABNORMAL LOW (ref >60)
GFR calc non Af Amer: 24 mL/min — ABNORMAL LOW (ref >60)
GFR, EST AFRICAN AMERICAN: 27 mL/min — AB (ref >60)
GFR, EST NON AFRICAN AMERICAN: 23 mL/min — AB (ref >60)
GLUCOSE: 124 mg/dL — AB (ref 65–99)
Glucose, Bld: 132 mg/dL — ABNORMAL HIGH (ref 65–99)
POTASSIUM: 4.7 mmol/L (ref 3.5–5.1)
Potassium: 4.5 mmol/L (ref 3.5–5.1)
Sodium: 130 mmol/L — ABNORMAL LOW (ref 135–145)
Sodium: 130 mmol/L — ABNORMAL LOW (ref 135–145)
TOTAL PROTEIN: 7.3 g/dL (ref 6.5–8.1)
Total Bilirubin: 0.9 mg/dL (ref 0.3–1.2)
Total Protein: 6.4 g/dL — ABNORMAL LOW (ref 6.5–8.1)

## 2014-11-14 LAB — PROTIME-INR
INR: 2.94 — ABNORMAL HIGH (ref 0.00–1.49)
INR: 2.47 — ABNORMAL HIGH (ref 0.00–1.49)
Prothrombin Time: 30.2 seconds — ABNORMAL HIGH (ref 11.6–15.2)
Prothrombin Time: 26.5 s — ABNORMAL HIGH (ref 11.6–15.2)

## 2014-11-14 LAB — I-STAT CHEM 8, ED
BUN: 15 mg/dL (ref 6–20)
CALCIUM ION: 1.03 mmol/L — AB (ref 1.13–1.30)
CHLORIDE: 96 mmol/L — AB (ref 101–111)
CREATININE: 2.5 mg/dL — AB (ref 0.61–1.24)
GLUCOSE: 126 mg/dL — AB (ref 65–99)
HCT: 33 % — ABNORMAL LOW (ref 39.0–52.0)
Hemoglobin: 11.2 g/dL — ABNORMAL LOW (ref 13.0–17.0)
Potassium: 4.6 mmol/L (ref 3.5–5.1)
Sodium: 133 mmol/L — ABNORMAL LOW (ref 135–145)
TCO2: 24 mmol/L (ref 0–100)

## 2014-11-14 LAB — TROPONIN I: Troponin I: 0.06 ng/mL — ABNORMAL HIGH (ref <0.031)

## 2014-11-14 LAB — I-STAT ARTERIAL BLOOD GAS, ED
ACID-BASE EXCESS: 1 mmol/L (ref 0.0–2.0)
BICARBONATE: 26.1 meq/L — AB (ref 20.0–24.0)
O2 SAT: 98 %
PO2 ART: 105 mmHg — AB (ref 80.0–100.0)
Patient temperature: 98.2
TCO2: 27 mmol/L (ref 0–100)
pCO2 arterial: 41.8 mmHg (ref 35.0–45.0)
pH, Arterial: 7.402 (ref 7.350–7.450)

## 2014-11-14 LAB — LIPASE, BLOOD: Lipase: 20 U/L — ABNORMAL LOW (ref 22–51)

## 2014-11-14 LAB — GLUCOSE, CAPILLARY
GLUCOSE-CAPILLARY: 106 mg/dL — AB (ref 65–99)
Glucose-Capillary: 108 mg/dL — ABNORMAL HIGH (ref 65–99)
Glucose-Capillary: 116 mg/dL — ABNORMAL HIGH (ref 65–99)

## 2014-11-14 LAB — MRSA PCR SCREENING: MRSA BY PCR: NEGATIVE

## 2014-11-14 LAB — I-STAT CG4 LACTIC ACID, ED: Lactic Acid, Venous: 1.62 mmol/L (ref 0.5–2.0)

## 2014-11-14 MED ORDER — FENTANYL CITRATE (PF) 100 MCG/2ML IJ SOLN
INTRAMUSCULAR | Status: AC
  Filled 2014-11-14: qty 2

## 2014-11-14 MED ORDER — SODIUM CHLORIDE 0.9 % IJ SOLN
3.0000 mL | Freq: Two times a day (BID) | INTRAMUSCULAR | Status: DC
  Administered 2014-11-14 – 2014-11-22 (×13): 3 mL via INTRAVENOUS

## 2014-11-14 MED ORDER — LIDOCAINE-EPINEPHRINE 1 %-1:100000 IJ SOLN
30.0000 mL | Freq: Once | INTRAMUSCULAR | Status: DC
  Filled 2014-11-14: qty 30

## 2014-11-14 MED ORDER — FENTANYL CITRATE (PF) 100 MCG/2ML IJ SOLN
50.0000 ug | Freq: Once | INTRAMUSCULAR | Status: AC
  Administered 2014-11-14: 50 ug via INTRAVENOUS

## 2014-11-14 MED ORDER — INSULIN GLARGINE 100 UNIT/ML ~~LOC~~ SOLN
10.0000 [IU] | Freq: Every day | SUBCUTANEOUS | Status: DC
  Administered 2014-11-15 – 2014-11-21 (×6): 10 [IU] via SUBCUTANEOUS
  Filled 2014-11-14 (×9): qty 0.1

## 2014-11-14 MED ORDER — OXYCODONE HCL 5 MG PO TABS
5.0000 mg | ORAL_TABLET | Freq: Four times a day (QID) | ORAL | Status: DC | PRN
  Administered 2014-11-14 – 2014-11-22 (×21): 5 mg via ORAL
  Filled 2014-11-14 (×22): qty 1

## 2014-11-14 MED ORDER — VANCOMYCIN HCL IN DEXTROSE 1-5 GM/200ML-% IV SOLN
1000.0000 mg | INTRAVENOUS | Status: DC
  Administered 2014-11-15: 1000 mg via INTRAVENOUS
  Filled 2014-11-14: qty 200

## 2014-11-14 MED ORDER — SODIUM CHLORIDE 0.9 % IV SOLN
INTRAVENOUS | Status: DC
  Administered 2014-11-14 (×2): via INTRAVENOUS

## 2014-11-14 MED ORDER — BISACODYL 5 MG PO TBEC: 5.0000 mg | DELAYED_RELEASE_TABLET | Freq: Every day | ORAL | Status: DC

## 2014-11-14 MED ORDER — LEVOTHYROXINE SODIUM 25 MCG PO TABS
75.0000 ug | ORAL_TABLET | Freq: Every day | ORAL | Status: DC
  Administered 2014-11-14 – 2014-11-21 (×7): 75 ug via ORAL
  Filled 2014-11-14 (×15): qty 1

## 2014-11-14 MED ORDER — COLLAGENASE 250 UNIT/GM EX OINT
TOPICAL_OINTMENT | Freq: Every day | CUTANEOUS | Status: DC
  Administered 2014-11-14 – 2014-11-20 (×3): via TOPICAL
  Filled 2014-11-14: qty 30

## 2014-11-14 MED ORDER — SILDENAFIL CITRATE 20 MG PO TABS
80.0000 mg | ORAL_TABLET | Freq: Three times a day (TID) | ORAL | Status: DC
  Administered 2014-11-14 – 2014-11-22 (×22): 80 mg via ORAL
  Filled 2014-11-14 (×28): qty 4

## 2014-11-14 MED ORDER — POLYETHYLENE GLYCOL 3350 17 G PO PACK: 17.0000 g | PACK | Freq: Every day | ORAL | Status: DC | PRN

## 2014-11-14 MED ORDER — PIPERACILLIN-TAZOBACTAM 3.375 G IVPB 30 MIN
3.3750 g | Freq: Three times a day (TID) | INTRAVENOUS | Status: DC
  Filled 2014-11-14 (×2): qty 50

## 2014-11-14 MED ORDER — VITAMIN K1 10 MG/ML IJ SOLN
10.0000 mg | Freq: Once | INTRAMUSCULAR | Status: AC
  Administered 2014-11-14: 10 mg via INTRAVENOUS
  Filled 2014-11-14: qty 1

## 2014-11-14 MED ORDER — DULOXETINE HCL 60 MG PO CPEP
60.0000 mg | ORAL_CAPSULE | Freq: Two times a day (BID) | ORAL | Status: DC
  Administered 2014-11-14 – 2014-11-22 (×16): 60 mg via ORAL
  Filled 2014-11-14 (×16): qty 1

## 2014-11-14 MED ORDER — BISACODYL 5 MG PO TBEC
5.0000 mg | DELAYED_RELEASE_TABLET | Freq: Every day | ORAL | Status: DC
  Administered 2014-11-14 – 2014-11-21 (×7): 5 mg via ORAL
  Filled 2014-11-14 (×9): qty 1

## 2014-11-14 MED ORDER — OXYCODONE-ACETAMINOPHEN 5-325 MG PO TABS
1.0000 | ORAL_TABLET | Freq: Four times a day (QID) | ORAL | Status: DC | PRN
  Administered 2014-11-14 – 2014-11-22 (×20): 1 via ORAL
  Filled 2014-11-14 (×20): qty 1

## 2014-11-14 MED ORDER — TRAZODONE HCL 50 MG PO TABS
50.0000 mg | ORAL_TABLET | Freq: Every day | ORAL | Status: DC
  Administered 2014-11-14 – 2014-11-21 (×8): 50 mg via ORAL
  Filled 2014-11-14 (×8): qty 1

## 2014-11-14 MED ORDER — ATORVASTATIN CALCIUM 40 MG PO TABS
40.0000 mg | ORAL_TABLET | Freq: Every day | ORAL | Status: DC
  Administered 2014-11-14 – 2014-11-22 (×9): 40 mg via ORAL
  Filled 2014-11-14 (×9): qty 1

## 2014-11-14 MED ORDER — INSULIN ASPART 100 UNIT/ML ~~LOC~~ SOLN
0.0000 [IU] | Freq: Three times a day (TID) | SUBCUTANEOUS | Status: DC
Start: 2014-11-14 — End: 2014-11-22
  Administered 2014-11-15: 1 [IU] via SUBCUTANEOUS

## 2014-11-14 MED ORDER — RENA-VITE PO TABS
1.0000 | ORAL_TABLET | Freq: Every day | ORAL | Status: DC
  Administered 2014-11-14 – 2014-11-21 (×8): 1 via ORAL
  Filled 2014-11-14 (×12): qty 1

## 2014-11-14 MED ORDER — VITAMIN K1 10 MG/ML IJ SOLN
10.0000 mg | INTRAVENOUS | Status: AC
  Administered 2014-11-14: 10 mg via INTRAVENOUS
  Filled 2014-11-14 (×2): qty 1

## 2014-11-14 MED ORDER — ACETAMINOPHEN 325 MG PO TABS
650.0000 mg | ORAL_TABLET | ORAL | Status: DC | PRN
  Administered 2014-11-20: 650 mg via ORAL
  Filled 2014-11-14: qty 2

## 2014-11-14 MED ORDER — MIDODRINE HCL 5 MG PO TABS
10.0000 mg | ORAL_TABLET | Freq: Three times a day (TID) | ORAL | Status: DC
  Administered 2014-11-14 – 2014-11-22 (×25): 10 mg via ORAL
  Filled 2014-11-14 (×21): qty 2

## 2014-11-14 MED ORDER — VANCOMYCIN HCL IN DEXTROSE 1-5 GM/200ML-% IV SOLN
1000.0000 mg | Freq: Once | INTRAVENOUS | Status: AC
  Administered 2014-11-14: 1000 mg via INTRAVENOUS
  Filled 2014-11-14: qty 200

## 2014-11-14 MED ORDER — PIPERACILLIN-TAZOBACTAM 3.375 G IVPB 30 MIN
3.3750 g | Freq: Once | INTRAVENOUS | Status: AC
  Administered 2014-11-14: 3.375 g via INTRAVENOUS
  Filled 2014-11-14: qty 50

## 2014-11-14 MED ORDER — OXYCODONE-ACETAMINOPHEN 10-325 MG PO TABS: 1.0000 | ORAL_TABLET | Freq: Four times a day (QID) | ORAL | Status: DC | PRN

## 2014-11-14 MED ORDER — WARFARIN SODIUM 5 MG PO TABS: 5.0000 mg | ORAL_TABLET | Freq: Once | ORAL | Status: DC

## 2014-11-14 MED ORDER — PIPERACILLIN-TAZOBACTAM 3.375 G IVPB
3.3750 g | Freq: Three times a day (TID) | INTRAVENOUS | Status: DC
  Administered 2014-11-14 (×3): 3.375 g via INTRAVENOUS
  Filled 2014-11-14 (×7): qty 50

## 2014-11-14 MED ORDER — AMIODARONE HCL 200 MG PO TABS
200.0000 mg | ORAL_TABLET | Freq: Every day | ORAL | Status: DC
  Administered 2014-11-14 – 2014-11-22 (×8): 200 mg via ORAL
  Filled 2014-11-14 (×9): qty 1

## 2014-11-14 MED ORDER — WARFARIN - PHARMACIST DOSING INPATIENT: Freq: Every day | Status: DC

## 2014-11-14 MED ORDER — OXYCODONE-ACETAMINOPHEN 5-325 MG PO TABS
1.0000 | ORAL_TABLET | Freq: Once | ORAL | Status: AC
  Administered 2014-11-14: 1 via ORAL
  Filled 2014-11-14: qty 1

## 2014-11-14 MED ORDER — SEVELAMER CARBONATE 800 MG PO TABS
800.0000 mg | ORAL_TABLET | Freq: Three times a day (TID) | ORAL | Status: DC
  Administered 2014-11-14 – 2014-11-22 (×20): 800 mg via ORAL
  Filled 2014-11-14 (×19): qty 1

## 2014-11-14 NOTE — Consult Note (Addendum)
WOC wound consult note Reason for Consult: LE wounds Wound type: LE wounds related to severe edema, bulla were previously drained and he continued to have ulcerations at these sites. At the time of his previous admission he also was in compression for LE edema management per the request of the heart failure team. He also is noted to have a Stage 3 pressure injury on the left heel.  Pressure Ulcer POA: Yes Measurement:left heel: 0.5cm x 0.5cm x 0.1; left dorsal foot: 1.0cm x 1.0cm x 0.1cm; right lateral dorsal foot 2cm x 0.5cm x 0.2cm  Wound bed: Left heel: 100% yellow Left dorsal foot: 100% dried serous fluid Right dorsal foot: 100% yellow base Drainage (amount, consistency, odor) moderate from the right foot with odor, otherwise none from the other sites Periwound:intact, hemosiderin staining bilaterally  Dressing procedure/placement/frequency: Enzymatic debridement ointment to the right dorsal foot and the left heel. Top with foam.  Then page ortho tech to place Unna's boots.   Verified with trauma ok to use compression wraps on LEs.   Change Saturday/Tuesday/Thursday while inpatient [place], [place]

## 2014-11-14 NOTE — Progress Notes (Signed)
ANTIBIOTIC + ANTICOAGULATION CONSULT NOTE - INITIAL  Pharmacy Consult for vancomycin, warfarin Indication: diabetic foot infection, afib  Allergies  Allergen Reactions  . Bydureon [Exenatide] Other (See Comments)    sweating  . Losartan Potassium Other (See Comments)    insomnia  . Gabapentin Other (See Comments)    confusion    Patient Measurements: Height: 5\' 10"  (177.8 cm) Weight: 219 lb 6.4 oz (99.519 kg) IBW/kg (Calculated) : 73 Adjusted Body Weight:   Vital Signs: Temp: 98.4 F (36.9 C) (10/06 0241) Temp Source: Oral (10/06 0241) BP: 103/55 mmHg (10/06 0241) Pulse Rate: 80 (10/06 0241) Intake/Output from previous day:   Intake/Output from this shift:    Labs:  Recent Labs  11/14/14 0006 11/14/14 0012  WBC 6.0  --   HGB 9.1* 11.2*  PLT 228  --   CREATININE 2.67* 2.50*   Estimated Creatinine Clearance: 36.2 mL/min (by C-G formula based on Cr of 2.5). No results for input(s): VANCOTROUGH, VANCOPEAK, VANCORANDOM, GENTTROUGH, GENTPEAK, GENTRANDOM, TOBRATROUGH, TOBRAPEAK, TOBRARND, AMIKACINPEAK, AMIKACINTROU, AMIKACIN in the last 72 hours.   Microbiology: No results found for this or any previous visit (from the past 720 hour(s)).  Medical History: Past Medical History  Diagnosis Date  . NICM (nonischemic cardiomyopathy) ([place])     a. NICM. EF 2011 25-30%. b. LHC 2009 nonobstructive CAD. c. h/o St. Jude ICD 2012. d. Echo 04/2013 - EF 25-30% with mildly decreased RV systolic function. e. Echo 01/2014: EF 20-25%, inf akinesis, RHC. e. Home milrinone started 4/16.  [name] CAD (coronary artery disease)     a. nonobstructive CAD by cath 2009 & 06/2014.  . Obesity   . CVA (cerebral vascular accident) ([place])     CVA 2007 without residual deficit  . Pituitary tumor ([place])      (nonfunctionging pituitary microadenoma) s/p gamma knife surgery at [facility] 2009 with neuropathy and retinopathy  . Depression   . Erectile dysfunction   . DDD (degenerative disc disease)    Chronic low back pain.   . OSA (obstructive sleep apnea)     a. 4/16 sleep study with severe OSA  . Monoclonal gammopathy   . CKD (chronic kidney disease), stage III   . 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 Polycythemia, secondary     improved/resolved  . Hypercholesterolemia   . Back pain     F/B [name]  . Monoclonal gammopathy of unknown significance     per [name]  . Neck pain     F/B [name]  . PAF (paroxysmal atrial fibrillation) ([place]) 06/04/10    a. On Xarelto. b. DCCV 02/2014, 04/2014 to NSR.  [name] History of diverticulitis of colon   . Type II or unspecified type diabetes mellitus with neurological manifestations, uncontrolled   . Nonproliferative diabetic retinopathy NOS(362.03)   . Ventricular tachycardia ([place]) 10/25/13    appropriate ICD shock, VT CL 230-240 msec  . Confusion 02/01/2014  . Hypertension   . AICD (automatic cardioverter/defibrillator) present   . Anxiety   . Peripheral vascular disease ([place])     2007  . Hypothyroidism   . PTSD (post-traumatic stress disorder)     a. Anxiety/PTSD from ICD shock  . Lupus anticoagulant positive     a. No h/o DVT. Saw Dr Lindi Adie with hematology.   . Pulmonary hypertension ([place])     a. Mixed pulmonary venous hypertension and PAH  . Chronic respiratory failure ([place])     a. As of  08/2014 requiring home O2 with ambulation due to desat.    Medications:  Anti-infectives    Start     Dose/Rate Route Frequency Ordered Stop   11/15/14 0200  vancomycin (VANCOCIN) IVPB 1000 mg/200 mL premix     1,000 mg 200 mL/hr over 60 Minutes Intravenous Every 24 hours 11/14/14 0252     11/14/14 0800  piperacillin-tazobactam (ZOSYN) IVPB 3.375 g     3.375 g 12.5 mL/hr over 240 Minutes Intravenous 3 times per day 11/14/14 0240     11/14/14 0600  piperacillin-tazobactam (ZOSYN) IVPB 3.375 g  Status:  Discontinued     3.375 g 100 mL/hr over 30 Minutes Intravenous 3 times per day 11/14/14 0233  11/14/14 0239   11/14/14 0130  vancomycin (VANCOCIN) IVPB 1000 mg/200 mL premix     1,000 mg 200 mL/hr over 60 Minutes Intravenous  Once 11/14/14 0126     11/14/14 0115  piperacillin-tazobactam (ZOSYN) IVPB 3.375 g     3.375 g 100 mL/hr over 30 Minutes Intravenous  Once 11/14/14 0109 11/14/14 0159     Assessment: 53 yom presented to the hospital with AMS. To start vancomycin for possible diabetic foot infection. Pt is afebrile and WBC is WNL. SCr is elevated at 2.5.  Vanc 10/6>> Zosyn 10/6>>  Also to resume chronic coumadin. INR is therapeutic at 2.94. H/H slightly low and platelets are WNL. No bleeding noted.   Goal of Therapy:  Vancomycin trough level 10-15 mcg/ml  INR 2-3  Plan:  - Warfarin 5mg  PO x 1 tonight - Daily INR - Vanc 1gm IV Q24H - F/u renal fxn, C&S, clinical status and trough at [place], Rande Lawman 11/14/2014,2:53 AM

## 2014-11-14 NOTE — Progress Notes (Addendum)
[facility]                                                                              Patient Demographics  Larry [name], is a 64 y.o. male, DOB - 09-25-1950, UMP:[id number]  Admit date - 11/13/2014   Admitting Physician Ivor Costa, MD  Outpatient Primary MD for the patient is [name], MD  LOS - 0   Chief Complaint  Patient presents with  . Altered Mental Status      HPI on 11/14/2014 by Dr. Felisa Bonier is a 64 y.o. 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  The patient's wife, patient has been mildly confused in the past 7 days. He has mild cough and shortness of breath, but no chest pain, fever or chills. Patient complains of abdominal pain. It is located in the right abdomen, constant, mild, radiating to the right shoulder, which has been going on for few weeks after he had a fall. He is constipated, no nausea, vomiting, diarrhea. He does not have symptoms of UTI. Patient done does not seem to have unilateral weakness, vision changes, hearing loss.  In ED, patient was found to have WBC 6.0, temperature normal, no tachycardia, creatinine 2.50, BUN 15, potassium 4.6, lactate of 1.62, INR 2.94. Chest x-ray showed new large right pleural effusion. CT-head showed remote right MCA infarction, unchanged pituitary adenoma, mild generalized atrophy and chronic small vessel disease, no acute findings.   Assessment & Plan   Right Rib Fractures with hemothorax -Seen on CT abdomen -Trauma surgery consulted and appreciated, patient will likely need chest tube vs VATS -Coumadin discontiued  Acute encephalopathy -Likely  secondary to the above -CT head: no acute findings -Currently afebrile, no leukocytosis, however patient started on vanc/zosyn -Per wife at bedside, mental status improving -Xanax and Temazepam held -PT recommended SNF  Essential hypertension -Blood pressure is soft on admission. -continue midodrine  Macrocytic Anemia -Hb drop likely secondary to hemothorax/hemorrhage -Baseline Hb 11 -Continue to monitor CBC  Diabetes mellitus, type 2 -Last A1c 7.7 on 06/26/14 -Continue lantus, ISS, CBG monitoring  Depression with anxiety: -hold Xanax due to AMS -continue cymbalta  Atrial Fibrillation -CHADSVASC 4 -Currently rate controlled -Was on coumadin, however, discontinued due to hemothorax and continued right retroperitoneal hemorrhage. -INR 2.75 -Vitamin K given -Continue amiodarone  Chronic systolic CHF (congestive heart failure) -Echocardiogram on 02/02/14 showed EF 20-25%.  -Currently compensated -Managed by dialysis  ESRD (end stage renal disease) on dialysis (MWFS) -Creatinine 2.5, BUN 15 -Nephrology consulted and appreciated  Abdominal pain/ Large Right Retroperitoneal hemorrhage/ Elevated transaminases -CT abdomen/pelvis: no new abnormality  -Lipase 20 -AST/ALT elevated- continue to monitor  Dyspnea/Cough -Secondary to above, continue treatment plan as above  Foot lesion -Wound care consulted  Code Status: DNR  Family Communication: Wife at bedside  Disposition Plan: Admitted.   Time Spent in minutes   30 minutes  Procedures  None  Consults  Trauma surgery  DVT Prophylaxis  SCDs  Lab Results  Component Value Date   PLT 204 11/14/2014    Medications  Scheduled Meds: . amiodarone  200 mg Oral Daily  . atorvastatin  40 mg Oral Daily  . bisacodyl  5-10 mg Oral QHS  . DULoxetine  60 mg Oral BID  . insulin aspart  0-9 Units Subcutaneous TID WC  . insulin glargine  10 Units Subcutaneous QHS  . levothyroxine  75 mcg Oral QAC breakfast  .  lidocaine-EPINEPHrine  30 mL Intradermal Once  . midodrine  10 mg Oral TID WC  . multivitamin  1 tablet Oral QHS  . piperacillin-tazobactam (ZOSYN)  IV  3.375 g Intravenous 3 times per day  . sevelamer carbonate  800 mg Oral TID WC  . sildenafil  80 mg Oral TID  . sodium chloride  3 mL Intravenous Q12H  . traZODone  50 mg Oral QHS  . [START ON 11/15/2014] vancomycin  1,000 mg Intravenous Q24H   Continuous Infusions: . sodium chloride 75 mL/hr at 11/14/14 1124   PRN Meds:.acetaminophen, oxyCODONE-acetaminophen **AND** oxyCODONE, polyethylene glycol  Antibiotics    Anti-infectives    Start     Dose/Rate Route Frequency Ordered Stop   11/15/14 0200  vancomycin (VANCOCIN) IVPB 1000 mg/200 mL premix     1,000 mg 200 mL/hr over 60 Minutes Intravenous Every 24 hours 11/14/14 0252     11/14/14 0800  piperacillin-tazobactam (ZOSYN) IVPB 3.375 g     3.375 g 12.5 mL/hr over 240 Minutes Intravenous 3 times per day 11/14/14 0240     11/14/14 0600  piperacillin-tazobactam (ZOSYN) IVPB 3.375 g  Status:  Discontinued     3.375 g 100 mL/hr over 30 Minutes Intravenous 3 times per day 11/14/14 0233 11/14/14 0239   11/14/14 0130  vancomycin (VANCOCIN) IVPB 1000 mg/200 mL premix     1,000 mg 200 mL/hr over 60 Minutes Intravenous  Once 11/14/14 0126 11/14/14 0259   11/14/14 0115  piperacillin-tazobactam (ZOSYN) IVPB 3.375 g     3.375 g 100 mL/hr over 30 Minutes Intravenous  Once 11/14/14 0109 11/14/14 0159      Subjective:   Larry [name] seen and examined today.  Patient's wife feels his mental status is improving. Patient denies chest pain, but complains of shortness of breath and cough.  Has some abdominal pain, but denies nausea or vomiting.      Objective:   Filed Vitals:   11/14/14 0241 11/14/14 0919 11/14/14 1100 11/14/14 1202  BP: 103/55 91/53 94/56    Pulse: 80 75 78 85  Temp: 98.4 F (36.9 C) 98.3 F (36.8 C) 97.8 F (36.6 C)   TempSrc: Oral Oral Oral   Resp: 18 20    Height:  5\' 10"  (1.778 m)     Weight: 99.519 kg (219 lb 6.4 oz)     SpO2: 100% 100% 94% 91%    Wt Readings from Last 3 Encounters:  11/14/14 99.519 kg (219 lb 6.4 oz)  10/11/14 97.9 kg (215 lb 13.3 oz)  08/24/14 102.4 kg (225 lb 12 oz)     Intake/Output Summary (Last 24 hours) at 11/14/14 1221 Last data filed at 11/14/14 1024  Gross per 24 hour  Intake    100 ml  Output      0 ml  Net    100 ml    Exam  General: Well developed, well nourished, NAD, appears stated age  HEENT: NCAT, mucous membranes moist.   Cardiovascular: S1 S2 auscultated,  RRR, no murmurs  Respiratory: Diminished breath sound on the right  Abdomen: Soft, nontender, nondistended, + bowel sounds  Extremities: warm dry without cyanosis clubbing or edema  Neuro: AAOx2, nonfocal  Skin: Several small lesions on both feeth, Left heel-eschar noted.   Data Review   Micro Results Recent Results (from the past 240 hour(s))  MRSA PCR Screening     Status: None   Collection Time: 11/14/14  3:05 AM  Result Value Ref Range Status   MRSA by PCR NEGATIVE NEGATIVE Final    Comment:        The GeneXpert MRSA Assay (FDA approved for NASAL specimens only), is one component of a comprehensive MRSA colonization surveillance program. It is not intended to diagnose MRSA infection nor to guide or monitor treatment for MRSA infections.     Radiology Reports Ct Abdomen Pelvis Wo Contrast  11/14/2014   ADDENDUM REPORT: 11/14/2014 08:10 ADDENDUM: Study discussed by telephone with Dr. Ree Kida On 11/14/2014 at 0800 hours. Electronically Signed   By: Genevie Ann M.D.   On: 11/14/2014 08:10  11/14/2014   CLINICAL DATA:  64 year old male with constant right side abdominal pain. Retroperitoneal hemorrhage in August. Subsequent encounter.  EXAM: CT ABDOMEN AND PELVIS WITHOUT CONTRAST  TECHNIQUE: Multidetector CT imaging of the abdomen and pelvis was performed following the standard protocol without IV contrast.  COMPARISON:  09/15/2014  and earlier.  FINDINGS: Increased right pleural effusion, now almost completely opacifying the visible right lung base. On this noncontrast exam there are multiple round and nodular intermediate density foci within the right pleural space, individually up to 4 cm (series 2, image 19), new since August. The right lower lobe is partially collapsed and its airways partially filled with fluid ( "Drowned lung" ). There are parenchymal calcifications suggesting granulomas.  Cardiomegaly with no pericardial effusion. No left pleural effusion and left lower lobe ventilation has improved.  There are displaced lateral right eighth and ninth rib fractures which are new since August (series 3). These are in proximity to the intermediate density material in the right pleural space. Lower thoracic and lumbar vertebrae appear intact. Bilateral SI joint ankylosis. Degenerative changes at the hips. Proximal left femur orthopedic hardware. No other acute osseous abnormality identified.  Bilateral flank anasarca has increased and now tracks to the pelvis. The large right retroperitoneal hemorrhage has decreased but not resolved, now encompassing 12 x 15.5 x 14 cm (AP by transverse by CC) versus 17 x 19 x 24 cm previously. Layering hematocrit again noted. The right psoas muscle is less abnormal now. Regional mass effect including anterior displacement of the right kidney is stable to mildly regressed.  Interval regressed free fluid in the abdomen.  Stable presacral stranding. Stable distal colon with intermittent retained stool. There is no longer gas in the urinary bladder. Diverticulosis of the sigmoid colon with no definite active inflammation. Mild gaseous distension of the left colon, transverse colon and right colon. Oral contrast has not reached the distal small bowel. No dilated or abnormal small bowel loops. Fat containing umbilical hernia. Negative stomach and duodenum.  The gallbladder is mildly distended, no pericholecystic  stranding. Stable noncontrast liver parenchyma, spleen, pancreas, adrenal glands, and kidneys. Aortoiliac calcified atherosclerosis noted. Stable maximal celiac axis lymph nodes up to 14 mm.  IMPRESSION: 1. Large volume right hemothorax with new displaced right lateral eighth and ninth rib fractures. Complete atelectasis of the visible right lung. 2. Regressed but not resolved large right retroperitoneal hemorrhage since August. Residual hemorrhage volume  approximates 1,302 mL. 3. No new abnormality in the abdomen or pelvis.  Electronically Signed: By: Genevie Ann M.D. On: 11/14/2014 07:52   Ct Head Wo Contrast  11/14/2014   CLINICAL DATA:  Altered mental status.  EXAM: CT HEAD WITHOUT CONTRAST  TECHNIQUE: Contiguous axial images were obtained from the base of the skull through the vertex without intravenous contrast.  COMPARISON:  08/25/2014, 02/01/2014  FINDINGS: There is no intracranial hemorrhage or extra-axial fluid collection. There is no evidence of acute infarction. There is encephalomalacia due to remote right posterior MCA infarction. There is mild generalized atrophy. There is mild periventricular hypodensity consistent with chronic small vessel disease. Prominent pituitary soft tissues are consistent with the previously described adenoma. Visible paranasal sinuses are clear.  IMPRESSION: 1. Remote right MCA infarction. 2. Unchanged pituitary adenoma. 3. Mild generalized atrophy and chronic small vessel disease. 4. No acute findings   Electronically Signed   By: Andreas Newport M.D.   On: 11/14/2014 00:53   Dg Chest Port 1 View  11/13/2014   CLINICAL DATA:  Short of breath  EXAM: PORTABLE CHEST 1 VIEW  COMPARISON:  09/04/2014  FINDINGS: Tunneled right internal jugular dialysis catheter in place. Tip is at the cavoatrial junction. Right PICC is been removed. Left subclavian AICD device and leads are stable. Large right pleural effusion has developed. This obscures the right lower lung.  IMPRESSION: New  large right pleural effusion.   Electronically Signed   By: Marybelle Killings M.D.   On: 11/13/2014 23:43    CBC  Recent Labs Lab 11/14/14 0006 11/14/14 0012 11/14/14 0500  WBC 6.0  --  5.9  HGB 9.1* 11.2* 8.9*  HCT 29.2* 33.0* 28.0*  PLT 228  --  204  MCV 101.7*  --  101.8*  MCH 31.7  --  32.4  MCHC 31.2  --  31.8  RDW 19.5*  --  20.0*  LYMPHSABS 0.7  --   --   MONOABS 0.9  --   --   EOSABS 0.2  --   --   BASOSABS 0.1  --   --     Chemistries   Recent Labs Lab 11/14/14 0006 11/14/14 0012 11/14/14 0329  NA 130* 133* 130*  K 4.7 4.6 4.5  CL 93* 96* 93*  CO2 26  --  26  GLUCOSE 124* 126* 132*  BUN 12 15 13   CREATININE 2.67* 2.50* 2.75*  CALCIUM 8.3*  --  8.1*  AST 329*  --  275*  ALT 236*  --  214*  ALKPHOS 119  --  108  BILITOT 0.8  --  0.9   ------------------------------------------------------------------------------------------------------------------ estimated creatinine clearance is 32.9 mL/min (by C-G formula based on Cr of 2.75). ------------------------------------------------------------------------------------------------------------------ No results for input(s): HGBA1C in the last 72 hours. ------------------------------------------------------------------------------------------------------------------ No results for input(s): CHOL, HDL, LDLCALC, TRIG, CHOLHDL, LDLDIRECT in the last 72 hours. ------------------------------------------------------------------------------------------------------------------ No results for input(s): TSH, T4TOTAL, T3FREE, THYROIDAB in the last 72 hours.  Invalid input(s): FREET3 ------------------------------------------------------------------------------------------------------------------ No results for input(s): VITAMINB12, FOLATE, FERRITIN, TIBC, IRON, RETICCTPCT in the last 72 hours.  Coagulation profile  Recent Labs Lab 11/14/14 0006  INR 2.94*    No results for input(s): DDIMER in the last 72  hours.  Cardiac Enzymes  Recent Labs Lab 11/14/14 0006  TROPONINI 0.06*   ------------------------------------------------------------------------------------------------------------------ Invalid input(s): POCBNP    Neesa Knapik D.O. on 11/14/2014 at 12:21 PM  Between 7am to 7pm - Pager - [phone]  After 7pm go to [email or web address] - password  [email or web address]  And look for the night coverage person covering for me after hours  [facility] Office  [phone]

## 2014-11-14 NOTE — ED Notes (Signed)
Pt. Was bladder scanned and has 147cc of urine in bladder per scanner

## 2014-11-14 NOTE — Consult Note (Signed)
Renal Service Consult Note [facility]  Larry [name] 11/14/2014 Sol Blazing Requesting Physician:  Dr Ree Kida  Reason for Consult:  ESRD pt with AMS HPI: The patient is a 64 y.o. year-old with hx of NICM, obesity, EF 20-25%, DM2, AICD, HTN, OSA, ESRD, DDD, anxiety/ depression, hx CVA '07, dieb neuropathy, chronic back/ neck pain, CAF who presented from [facility] to ED with AMS for "a few days".  Usual HD is MWF. Pt doesn't provide much history. He was seen in ED and started on IV abx for poss PNA and possible foot infection(s).   Work up in ED shows R hemothorax with 2 right-sided rib fractures by CT.  CXR shows large R effusion, L is clear.  Patient denies any SOB, CP. +rib pain, asking for pain meds. Has sore feet bilat.     CT abd > 10/6 showing rib fractures x 2 and R hemothorax. Also regressed but not resolved R RP hemorrhage, residual vol is 1300 cc.  CXR > very large R effusion   Chart review: Dec '15 - a/c systolic CHF, CP, PAF, ICD in place, DM2, HTN, vtach, Xarelto for afib, confusion, Cleora Fleet. Diuresed. Not CRT candidate. CKD Apr '16 - a/c syst CHF, nonischemic. Admitted for initiation of milrinone and pre-LVAD testing.  dc'd on lower dose of MTP and on torsemide at 40/d.   May '16 - SOB > diuresed. Not a good LVAD candidate due to anxiety/ chronic pain/ pulm HTN and ^creat .  July '16 - a/c CHF, diuresed. Had afib , converted back to NSR by DCCV. Remained breathless on milrinone drip.  Had mult dental extractions. [place] home.  Aug '16 - fall w L hip fx s/p nail on 08/26/14. Chronic sCHF, PAF, a/c renal failure. Got CVVH.  AFib on po amio and coumadin. Hemorrhagic shock with massive RPH, taken off of AC for this bleed. Hb stable at dc.   ROS  denies CP, cough or SOB  no abd pian, n/v/d  no jpoint pain or back   Past Medical History  Past Medical History  Diagnosis Date  . NICM (nonischemic cardiomyopathy) ([place])     a. NICM. EF 2011 25-30%.  b. LHC 2009 nonobstructive CAD. c. h/o St. Jude ICD 2012. d. Echo 04/2013 - EF 25-30% with mildly decreased RV systolic function. e. Echo 01/2014: EF 20-25%, inf akinesis, RHC. e. Home milrinone started 4/16.  [name] CAD (coronary artery disease)     a. nonobstructive CAD by cath 2009 & 06/2014.  . Obesity   . CVA (cerebral vascular accident) ([place])     CVA 2007 without residual deficit  . Pituitary tumor ([place])      (nonfunctionging pituitary microadenoma) s/p gamma knife surgery at [facility] 2009 with neuropathy and retinopathy  . Depression   . Erectile dysfunction   . DDD (degenerative disc disease)     Chronic low back pain.   . OSA (obstructive sleep apnea)     a. 4/16 sleep study with severe OSA  . Monoclonal gammopathy   . CKD (chronic kidney disease), stage III   . 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 Polycythemia, secondary     improved/resolved  . Hypercholesterolemia   . Back pain     F/B [name]  . Monoclonal gammopathy of unknown significance     per [name]  . Neck pain     F/B [name]  . PAF (paroxysmal atrial fibrillation) ([place]) 06/04/10  a. On Xarelto. b. DCCV 02/2014, 04/2014 to NSR.  [name] History of diverticulitis of colon   . Type II or unspecified type diabetes mellitus with neurological manifestations, uncontrolled   . Nonproliferative diabetic retinopathy NOS(362.03)   . Ventricular tachycardia ([place]) 10/25/13    appropriate ICD shock, VT CL 230-240 msec  . Confusion 02/01/2014  . Hypertension   . AICD (automatic cardioverter/defibrillator) present   . Anxiety   . Peripheral vascular disease ([place])     2007  . Hypothyroidism   . PTSD (post-traumatic stress disorder)     a. Anxiety/PTSD from ICD shock  . Lupus anticoagulant positive     a. No h/o DVT. Saw Dr Lindi Adie with hematology.   . Pulmonary hypertension ([place])     a. Mixed pulmonary venous hypertension and PAH  . Chronic respiratory failure ([place])      a. As of 08/2014 requiring home O2 with ambulation due to desat.   Past Surgical History  Past Surgical History  Procedure Laterality Date  . Gamma knife surgery for pituitary tumor    . Tonsillectomy    . Cardiac defibrillator placement  02/19/10    By JA.   . Right heart catheterization N/A 09/17/2013    Procedure: RIGHT HEART CATH;  Surgeon: [name], MD;  Location: [facility] CATH LAB;  Service: Cardiovascular;  Laterality: N/A;  . Cardiac catheterization    . Brain surgery    . Insert / replace / remove pacemaker    . Cardioversion N/A 02/14/2014    Procedure: CARDIOVERSION;  Surgeon: [name], MD;  Location: [place];  Service: Cardiovascular;  Laterality: N/A;  . Cardioversion N/A 05/02/2014    Procedure: CARDIOVERSION;  Surgeon: [name], MD;  Location[facility] ENDOSCOPY;  Service: Cardiovascular;  Laterality: N/A;  . Right heart catheterization N/A 05/08/2014    Procedure: RIGHT HEART CATH;  Surgeon: [name], MD;  Location: [facility] CATH LAB;  Service: Cardiovascular;  Laterality: N/A;  . Cardiac catheterization N/A 06/26/2014    Procedure: Right/Left Heart Cath And Coronary Angiography;  Surgeon: [name], MD;  Location: [place] CV LAB;  Service: Cardiovascular;  Laterality: N/A;  . Cardiac catheterization N/A 08/21/2014    Procedure: Right Heart Cath;  Surgeon: [name], MD;  Location: [place] CV LAB;  Service: Cardiovascular;  Laterality: N/A;  . Multiple extractions with alveoloplasty N/A 08/22/2014    Procedure: Extraction of toothy #'s 3,5,6,7,9,10,11,12,13,14,20,21,22,23,27,28,29,30 with alveoloplasty and bilateral mandibular tori reductions;  Surgeon: Lenn Cal, DDS;  Location: [place];  Service: Oral Surgery;  Laterality: N/A;  . Cataract extraction    . Femur im nail Left 08/26/2014    Procedure: INTRAMEDULLARY (IM) NAIL FEMORAL;  Surgeon: [name], MD;  Location: [place];  Service: Orthopedics;  Laterality: Left;  . Av fistula  placement Right 10/01/2014    Procedure: Creation of  Right Arm  ARTERIOVENOUS Fistula;  Surgeon: [name], MD;  Location: [facility] OR;  Service: Vascular;  Laterality: Right;   Family History  Family History  Problem Relation Age of Onset  . Lung cancer    . Cancer Mother     skin  . Dementia Mother   . Depression Mother   . Heart disease Father 64  . Hypertension Father   . Lung cancer Father   . Obesity Father   . Obesity Sister   . Hypertension Sister   . Diabetes Brother   . Hypertension Brother   . Heart  disease Brother 51    Died of stroke MI age 65  . Obesity Brother   . Lung cancer Paternal Uncle   . Diabetes Paternal Uncle     requiring leg amputations    Social History  reports that he quit smoking about 31 years ago. His smoking use included Cigars. He has never used smokeless tobacco. He reports that he does not drink alcohol or use illicit drugs. Allergies  Allergies  Allergen Reactions  . Bydureon [Exenatide] Other (See Comments)    sweating  . Losartan Potassium Other (See Comments)    insomnia  . Gabapentin Other (See Comments)    confusion   Home medications Prior to Admission medications   Medication Sig Start Date End Date Taking? Authorizing Provider  acetaminophen (TYLENOL) 325 MG tablet Take 650 mg by mouth every 4 (four) hours as needed for moderate pain or fever.   Yes Historical Provider, MD  ALPRAZolam Duanne Moron) 0.5 MG tablet Take 0.5 mg by mouth every 8 (eight) hours as needed for anxiety.   Yes Historical Provider, MD  amiodarone (PACERONE) 400 MG tablet Take 0.5 tablets (200 mg total) by mouth daily. 10/29/14  Yes [name], MD  atorvastatin (LIPITOR) 40 MG tablet TAKE 1 TABLET DAILY 03/18/14  Yes [name], MD  bisacodyl (DULCOLAX) 5 MG EC tablet Take 5 mg by mouth at bedtime.   Yes Historical Provider, MD  bisacodyl (DULCOLAX) 5 MG EC tablet Take 5-10 mg by mouth at bedtime. Take 5mg  at bedtime, take 10 mg at bedtime if no bowel  movement during the day.   Yes Historical Provider, MD  chlorhexidine (PERIDEX) 0.12 % solution Perform mouth rinses twice daily after breakfast and at bedtime. Patient taking differently: Use as directed in the mouth or throat 2 (two) times daily. Perform mouth rinses twice daily after breakfast and at bedtime (swish and spit) 08/24/14  Yes Dayna N Dunn, PA-C  DULoxetine (CYMBALTA) 30 MG capsule Take 60 mg by mouth 2 (two) times daily.    Yes Historical Provider, MD  insulin glargine (LANTUS) 100 unit/mL SOPN Inject 15 Units into the skin at bedtime.    Yes Historical Provider, MD  insulin lispro (HUMALOG KWIKPEN) 100 UNIT/ML KiwkPen Inject 5-25 Units into the skin 3 (three) times daily as needed.    Yes Historical Provider, MD  levothyroxine (SYNTHROID, LEVOTHROID) 75 MCG tablet TAKE 1 TABLET ON AN EMPTY STOMACH 30 MINUTES BEFORE BREAKFAST 07/13/14  Yes Historical Provider, MD  midodrine (PROAMATINE) 10 MG tablet Take 1 tablet (10 mg total) by mouth 3 (three) times daily. 10/11/14  Yes Amy D Clegg, NP  multivitamin (RENA-VIT) TABS tablet Take 1 tablet by mouth at bedtime. 10/11/14  Yes Amy D Clegg, NP  polyethylene glycol (MIRALAX / GLYCOLAX) packet Take 17 g by mouth daily as needed for moderate constipation. 10/11/14  Yes Amy D Clegg, NP  sevelamer carbonate (RENVELA) 800 MG tablet Take 800 mg by mouth 3 (three) times daily with meals.   Yes Historical Provider, MD  sildenafil (REVATIO) 20 MG tablet Take 4 tablets (80 mg total) by mouth 3 (three) times daily. 08/24/14  Yes Dayna N Dunn, PA-C  traZODone (DESYREL) 50 MG tablet Take 50 mg by mouth at bedtime.   Yes Historical Provider, MD  warfarin (COUMADIN) 5 MG tablet Take 5 mg by mouth daily.   Yes Historical Provider, MD  ALPRAZolam Duanne Moron) 1 MG tablet Take 1 tablet (1 mg total) by mouth 2 (two) times daily as  needed for anxiety (be careful with extra sedation/somnolence). Patient not taking: Reported on 11/13/2014 10/11/14   Amy D Clegg, NP  bisacodyl  (DULCOLAX) 10 MG suppository Place 1 suppository (10 mg total) rectally daily as needed for moderate constipation. 10/11/14   Amy D Clegg, NP  ondansetron (ZOFRAN) 4 MG tablet Take 1 tablet (4 mg total) by mouth every 8 (eight) hours as needed for nausea or vomiting. 10/11/14   Amy D Ninfa Meeker, NP  oxyCODONE-acetaminophen (PERCOCET) 10-325 MG per tablet Take 1 tablet by mouth every 4 (four) hours as needed for pain. Patient taking differently: Take 1-2 tablets by mouth every 4 (four) hours as needed for pain.  10/11/14   Amy D Clegg, NP  temazepam (RESTORIL) 7.5 MG capsule Take 1 capsule (7.5 mg total) by mouth at bedtime as needed for sleep. Patient not taking: Reported on 11/13/2014 10/11/14   Conrad Maywood, NP   Liver Function Tests  Recent Labs Lab 11/14/14 0006 11/14/14 0329  AST 329* 275*  ALT 236* 214*  ALKPHOS 119 108  BILITOT 0.8 0.9  PROT 7.3 6.4*  ALBUMIN 2.9* 2.7*    Recent Labs Lab 11/14/14 0329  LIPASE 20*   CBC  Recent Labs Lab 11/14/14 0006 11/14/14 0012 11/14/14 0500  WBC 6.0  --  5.9  NEUTROABS 4.1  --   --   HGB 9.1* 11.2* 8.9*  HCT 29.2* 33.0* 28.0*  MCV 101.7*  --  101.8*  PLT 228  --  720   Basic Metabolic Panel  Recent Labs Lab 11/14/14 0006 11/14/14 0012 11/14/14 0329  NA 130* 133* 130*  K 4.7 4.6 4.5  CL 93* 96* 93*  CO2 26  --  26  GLUCOSE 124* 126* 132*  BUN 12 15 13   CREATININE 2.67* 2.50* 2.75*  CALCIUM 8.3*  --  8.1*    Filed Vitals:   11/14/14 0919 11/14/14 1100 11/14/14 1202 11/14/14 1254  BP: 91/53 94/56  105/59  Pulse: 75 78 85 78  Temp: 98.3 F (36.8 C) 97.8 F (36.6 C)  98 F (36.7 C)  TempSrc: Oral Oral  Oral  Resp: 20   12  Height:      Weight:      SpO2: 100% 94% 91% 100%   Exam Older adult male, lethargic but easy to arouse, "where's my pain meds!" , confused a bit, responds to commands Sclera anicteric, throat clear No jvd Chest clear bilat to the bases Irreg rhythm, 2/6 SEM no RG Abd is obese w large pannus, no  groin wounds or drainage, +BS, no ascites  GU normla male LE's bilat bright erythematous skin changes along the calfs ant / lat/ post bilaterally. Also the feet have red skin color changes.  L dorsal forefoot and R dorsal forefoot have small areas of skin breakdown each with yellow slough about 1.5x 1.5 cm on both sides.  Neuro is sleepy but awakens to voice, follows simple commands   MWFS [place]   4hrs 95kgs 4K/2.25 Ca Cath No heparin  P4 micera 50 q 2 weeks- last given9/28   Assessment: 1. AMS- Head CT- no acute findings medications vs infection. On empiric abx. Possible foot infection(s).  2. Rib fracture and hemothorax- trauma surgery following. Chest tube vs VATS 3. ESRD - [facility] [place], HD pending tomorrow. occasional sign offs d/t anxiety. 4. Volume - up 4kg by wts  5. Anemia - on mircera at OP unit, hgb 8.9  6. Metabolic bone disease - cont renvela, last phos  6.7 and PTH 177 7. Nutrition - MVI 8. Chronic hypotension on midodrine 9. AFib on coumadin/ amiodarone        Plan- HD tomorrow. Stopped IVF's, does not need IVF's.    Kelly Splinter MD (pgr) [phone]    [phone] 11/14/2014, 1:00 PM

## 2014-11-14 NOTE — Care Management Note (Signed)
Case Management Note  Patient Details  Name: Larry [name] MRN: [id number] Date of Birth: 12-May-1950  Subjective/Objective:                    Action/Plan:  Initial UR completed  Expected Discharge Date:                  Expected Discharge Plan:  Skilled Nursing Facility  In-House Referral:  Clinical Social Work  Discharge planning Services     Post Acute Care Choice:    Choice offered to:     DME Arranged:    DME Agency:     HH Arranged:    [place] Agency:     Status of Service:  In process, will continue to follow  Medicare Important Message Given:    Date Medicare IM Given:    Medicare IM give by:    Date Additional Medicare IM Given:    Additional Medicare Important Message give by:     If discussed at [place] of Stay Meetings, dates discussed:    Additional Comments:  Marilu Favre, RN 11/14/2014, 3:17 PM

## 2014-11-14 NOTE — Progress Notes (Signed)
Orthopedic Tech Progress Note Patient Details:  MAEJOR ERVEN 24-Oct-1950 [id number]  Ortho Devices Type of Ortho Device: Louretta Parma boot Ortho Device/Splint Location: (B) LE Ortho Device/Splint Interventions: Ordered, Application   [name] 11/14/2014, 6:24 PM

## 2014-11-14 NOTE — Evaluation (Signed)
Physical Therapy Evaluation Patient Details Name: SOPHIE QUILES MRN: [id number] DOB: 1951-01-25 Today's Date: 11/14/2014   History of Present Illness  PJ ZEHNER is a 64 y.o. male admitted from [facility] with encephalopathy and R pleural effusion. He apparently fell 3x while at [facility] and also has 2right rib fx. Pt with recent admit July-Sept after left hip fx s/p IM nail, pacemaker, initiation of HD due to ESRD. PMHx NICM, CAD, DDD, back pain, CVA, obesity.   Clinical Impression  Mr.Scorsone is very familiar from last lengthy admission. Currently he demonstrates AMS, decreased mobility, strength and activity tolerance. Pt will benefit from acute therapy to maximize function, strength and gait to decrease burden of care and fall risk. Wife present throughout and tearful with news of pt now needing chest tube and she feels overwhelmed but all that has happened with him medically.     Follow Up Recommendations SNF;Supervision/Assistance - 24 hour    Equipment Recommendations  Rolling walker with 5" wheels    Recommendations for Other Services       Precautions / Restrictions Precautions Precautions: Fall      Mobility  Bed Mobility               General bed mobility comments: EOB on arrival  Transfers Overall transfer level: Needs assistance   Transfers: Sit to/from Stand Sit to Stand: Min guard         General transfer comment: cues for hand placement, safety and sequence with cues to back fully to surface prior to sitting  Ambulation/Gait Ambulation/Gait assistance: Min assist Ambulation Distance (Feet): 20 Feet Assistive device: Rolling walker (2 wheeled) Gait Pattern/deviations: Step-to pattern;Trunk flexed   Gait velocity interpretation: Below normal speed for age/gender General Gait Details: pt with cues for posture, position in RW and safety. Pt fatigued and denied further attempts at gait  Stairs            Wheelchair Mobility    Modified  Rankin (Stroke Patients Only)       Balance Overall balance assessment: Needs assistance   Sitting balance-Leahy Scale: Good       Standing balance-Leahy Scale: Poor                               Pertinent Vitals/Pain Pain Assessment: No/denies pain  HR 85 sats 92% on RA, no SOB    Home Living Family/patient expects to be discharged to:: Skilled nursing facility Living Arrangements: Spouse/significant other Available Help at Discharge: Family;Available 24 hours/day Type of Home: House Home Access: Stairs to enter Entrance Stairs-Rails: None   Home Layout: Two level;Bed/bath upstairs;1/2 bath on main level Home Equipment: None      Prior Function Level of Independence: Needs assistance   Gait / Transfers Assistance Needed: since fall with hip fx in july has been supervision with RW for gait and transfers  ADL's / Homemaking Assistance Needed: supervision- min assist for ADLs and has not been performing housework  Comments: Pt was independent prior to July fall with occasional assist for lower body dressing due to back pain. Pt  and wife report declining function since being at SNF particularly over the last week.      Hand Dominance        Extremity/Trunk Assessment   Upper Extremity Assessment: Generalized weakness           Lower Extremity Assessment: Generalized weakness      Cervical / Trunk  Assessment: Kyphotic  Communication   Communication: No difficulties  Cognition Arousal/Alertness: Awake/alert Behavior During Therapy: Flat affect Overall Cognitive Status: Impaired/Different from baseline Area of Impairment: Orientation;Following commands;Memory;Safety/judgement Orientation Level: Disoriented to;Situation;Place   Memory: Decreased short-term memory Following Commands: Follows one step commands inconsistently;Follows one step commands with increased time Safety/Judgement: Decreased awareness of safety          General  Comments      Exercises General Exercises - Lower Extremity Long Arc Quad: AROM;Seated;Both;10 reps Hip Flexion/Marching: AAROM;Seated;Both;10 reps      Assessment/Plan    PT Assessment Patient needs continued PT services  PT Diagnosis Difficulty walking;Generalized weakness;Altered mental status   PT Problem List Decreased strength;Decreased activity tolerance;Decreased balance;Decreased mobility;Decreased knowledge of use of DME  PT Treatment Interventions Gait training;DME instruction;Stair training;Functional mobility training;Therapeutic activities;Therapeutic exercise;Balance training;Patient/family education;Cognitive remediation   PT Goals (Current goals can be found in the Care Plan section) Acute Rehab PT Goals Patient Stated Goal: be able to get better PT Goal Formulation: With patient/family Time For Goal Achievement: 11/28/14 Potential to Achieve Goals: Fair    Frequency Min 3X/week   Barriers to discharge Decreased caregiver support;Inaccessible home environment      Co-evaluation               End of Session Equipment Utilized During Treatment: Gait belt Activity Tolerance: Patient tolerated treatment well Patient left: in chair;with call bell/phone within reach;with chair alarm set;with family/visitor present Nurse Communication: Mobility status;Precautions         Time: 1020-1047 PT Time Calculation (min) (ACUTE ONLY): 27 min   Charges:   PT Evaluation $Initial PT Evaluation Tier I: 1 Procedure PT Treatments $Therapeutic Activity: 8-22 mins   PT G CodesMelford Aase 11/14/2014, 12:16 PM [name], [place]

## 2014-11-14 NOTE — H&P (Signed)
[facility] History and Physical  Larry [name] LKG:[id number] DOB: 1951-01-09 DOA: 11/13/2014  Referring physician: ED physician PCP: [name], MD  Specialists:   Chief Complaint: Altered mental status, cough, abdominal pain  HPI: Larry [name] is a 64 y.o. 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  The patient's wife, patient has been mildly confused in the past 7 days. He has mild cough and shortness of breath, but no chest pain, fever or chills. Patient complains of abdominal pain. It is located in the right abdomen, constant, mild, radiating to the right shoulder, which has been going on for few weeks after he had a fall. He is constipated, no nausea, vomiting, diarrhea. He does not have symptoms of UTI. Patient done does not seem to have unilateral weakness, vision changes, hearing loss.  In ED, patient was found to have WBC 6.0, temperature normal, no tachycardia, creatinine 2.50, BUN 15, potassium 4.6, lactate of 1.62, INR 2.94. Chest x-ray showed new large right pleural effusion. CT-head showed remote right MCA infarction, unchanged pituitary adenoma, mild generalized atrophy and chronic small vessel disease, no acute findings.   Where does patient live? SNF  Can patient participate in ADLs?  None    Review of Systems: Could not be reviewed accurately due to altered mental status.  Allergy:  Allergies  Allergen Reactions  . Bydureon [Exenatide] Other (See Comments)    sweating  . Losartan Potassium Other (See Comments)    insomnia  . Gabapentin Other (See Comments)    confusion    Past Medical History   Diagnosis Date  . NICM (nonischemic cardiomyopathy) ([place])     a. NICM. EF 2011 25-30%. b. LHC 2009 nonobstructive CAD. c. h/o St. Jude ICD 2012. d. Echo 04/2013 - EF 25-30% with mildly decreased RV systolic function. e. Echo 01/2014: EF 20-25%, inf akinesis, RHC. e. Home milrinone started 4/16.  [name] CAD (coronary artery disease)     a. nonobstructive CAD by cath 2009 & 06/2014.  . Obesity   . CVA (cerebral vascular accident) ([place])     CVA 2007 without residual deficit  . Pituitary tumor ([place])      (nonfunctionging pituitary microadenoma) s/p gamma knife surgery at [facility] 2009 with neuropathy and retinopathy  . Depression   . Erectile dysfunction   . DDD (degenerative disc disease)     Chronic low back pain.   . OSA (obstructive sleep apnea)     a. 4/16 sleep study with severe OSA  . Monoclonal gammopathy   . CKD (chronic kidney disease), stage III   . 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 Polycythemia, secondary     improved/resolved  . Hypercholesterolemia   . Back pain     F/B [name]  . Monoclonal gammopathy of unknown significance     per [name]  . Neck pain     F/B [name]  . PAF (paroxysmal atrial fibrillation) ([place]) 06/04/10    a. On Xarelto. b. DCCV 02/2014, 04/2014 to NSR.  [name] History of diverticulitis of colon   . Type II or unspecified type diabetes mellitus with neurological manifestations, uncontrolled   . Nonproliferative diabetic retinopathy NOS(362.03)   . Ventricular tachycardia (  [place]) 10/25/13    appropriate ICD shock, VT CL 230-240 msec  . Confusion 02/01/2014  . Hypertension   . AICD (automatic cardioverter/defibrillator) present   . Anxiety   . Peripheral vascular disease ([place])     2007  . Hypothyroidism   . PTSD (post-traumatic stress disorder)     a. Anxiety/PTSD from ICD shock  . Lupus anticoagulant positive     a. No h/o DVT. Saw Dr Lindi Adie with hematology.   . Pulmonary hypertension ([place])      a. Mixed pulmonary venous hypertension and PAH  . Chronic respiratory failure ([place])     a. As of 08/2014 requiring home O2 with ambulation due to desat.    Past Surgical History  Procedure Laterality Date  . Gamma knife surgery for pituitary tumor    . Tonsillectomy    . Cardiac defibrillator placement  02/19/10    By JA.   . Right heart catheterization N/A 09/17/2013    Procedure: RIGHT HEART CATH;  Surgeon: [name], MD;  Location: [facility] CATH LAB;  Service: Cardiovascular;  Laterality: N/A;  . Cardiac catheterization    . Brain surgery    . Insert / replace / remove pacemaker    . Cardioversion N/A 02/14/2014    Procedure: CARDIOVERSION;  Surgeon: [name], MD;  Location: [facility];  Service: Cardiovascular;  Laterality: N/A;  . Cardioversion N/A 05/02/2014    Procedure: CARDIOVERSION;  Surgeon: [name], MD;  Location: [facility] ENDOSCOPY;  Service: Cardiovascular;  Laterality: N/A;  . Right heart catheterization N/A 05/08/2014    Procedure: RIGHT HEART CATH;  Surgeon: [name], MD;  Location: [facility] CATH LAB;  Service: Cardiovascular;  Laterality: N/A;  . Cardiac catheterization N/A 06/26/2014    Procedure: Right/Left Heart Cath And Coronary Angiography;  Surgeon: [name], MD;  Location: [facility] CV LAB;  Service: Cardiovascular;  Laterality: N/A;  . Cardiac catheterization N/A 08/21/2014    Procedure: Right Heart Cath;  Surgeon: [name], MD;  Location: [place] CV LAB;  Service: Cardiovascular;  Laterality: N/A;  . Multiple extractions with alveoloplasty N/A 08/22/2014    Procedure: Extraction of toothy #'s 3,5,6,7,9,10,11,12,13,14,20,21,22,23,27,28,29,30 with alveoloplasty and bilateral mandibular tori reductions;  Surgeon: Lenn Cal, DDS;  Location: [place];  Service: Oral Surgery;  Laterality: N/A;  . Cataract extraction    . Femur im nail Left 08/26/2014    Procedure: INTRAMEDULLARY (IM) NAIL FEMORAL;  Surgeon: [name], MD;   Location: [place];  Service: Orthopedics;  Laterality: Left;  . Av fistula placement Right 10/01/2014    Procedure: Creation of  Right Arm  ARTERIOVENOUS Fistula;  Surgeon: [name], MD;  Location: [place];  Service: Vascular;  Laterality: Right;    Social History:  reports that he quit smoking about 31 years ago. His smoking use included Cigars. He has never used smokeless tobacco. He reports that he does not drink alcohol or use illicit drugs.  Family History:  Family History  Problem Relation Age of Onset  . Lung cancer    . Cancer Mother     skin  . Dementia Mother   . Depression Mother   . Heart disease Father 64  . Hypertension Father   . Lung cancer Father   . Obesity Father   . Obesity Sister   . Hypertension Sister   . Diabetes Brother   . Hypertension Brother   . Heart disease Brother 47    Died of  stroke MI age 63  . Obesity Brother   . Lung cancer Paternal Uncle   . Diabetes Paternal Uncle     requiring leg amputations      Prior to Admission medications   Medication Sig Start Date End Date Taking? Authorizing Provider  acetaminophen (TYLENOL) 325 MG tablet Take 650 mg by mouth every 4 (four) hours as needed for moderate pain or fever.   Yes Historical Provider, MD  ALPRAZolam Duanne Moron) 0.5 MG tablet Take 0.5 mg by mouth every 8 (eight) hours as needed for anxiety.   Yes Historical Provider, MD  amiodarone (PACERONE) 400 MG tablet Take 0.5 tablets (200 mg total) by mouth daily. 10/29/14  Yes [name], MD  atorvastatin (LIPITOR) 40 MG tablet TAKE 1 TABLET DAILY 03/18/14  Yes [name], MD  bisacodyl (DULCOLAX) 5 MG EC tablet Take 5 mg by mouth at bedtime.   Yes Historical Provider, MD  bisacodyl (DULCOLAX) 5 MG EC tablet Take 5-10 mg by mouth at bedtime. Take 5mg  at bedtime, take 10 mg at bedtime if no bowel movement during the day.   Yes Historical Provider, MD  chlorhexidine (PERIDEX) 0.12 % solution Perform mouth rinses twice daily after breakfast  and at bedtime. Patient taking differently: Use as directed in the mouth or throat 2 (two) times daily. Perform mouth rinses twice daily after breakfast and at bedtime (swish and spit) 08/24/14  Yes Dayna N Dunn, PA-C  DULoxetine (CYMBALTA) 30 MG capsule Take 60 mg by mouth 2 (two) times daily.    Yes Historical Provider, MD  insulin glargine (LANTUS) 100 unit/mL SOPN Inject 15 Units into the skin at bedtime.    Yes Historical Provider, MD  insulin lispro (HUMALOG KWIKPEN) 100 UNIT/ML KiwkPen Inject 5-25 Units into the skin 3 (three) times daily as needed.    Yes Historical Provider, MD  levothyroxine (SYNTHROID, LEVOTHROID) 75 MCG tablet TAKE 1 TABLET ON AN EMPTY STOMACH 30 MINUTES BEFORE BREAKFAST 07/13/14  Yes Historical Provider, MD  midodrine (PROAMATINE) 10 MG tablet Take 1 tablet (10 mg total) by mouth 3 (three) times daily. 10/11/14  Yes Amy D Clegg, NP  multivitamin (RENA-VIT) TABS tablet Take 1 tablet by mouth at bedtime. 10/11/14  Yes Amy D Clegg, NP  polyethylene glycol (MIRALAX / GLYCOLAX) packet Take 17 g by mouth daily as needed for moderate constipation. 10/11/14  Yes Amy D Clegg, NP  sevelamer carbonate (RENVELA) 800 MG tablet Take 800 mg by mouth 3 (three) times daily with meals.   Yes Historical Provider, MD  sildenafil (REVATIO) 20 MG tablet Take 4 tablets (80 mg total) by mouth 3 (three) times daily. 08/24/14  Yes Dayna N Dunn, PA-C  traZODone (DESYREL) 50 MG tablet Take 50 mg by mouth at bedtime.   Yes Historical Provider, MD  warfarin (COUMADIN) 5 MG tablet Take 5 mg by mouth daily.   Yes Historical Provider, MD  ALPRAZolam Duanne Moron) 1 MG tablet Take 1 tablet (1 mg total) by mouth 2 (two) times daily as needed for anxiety (be careful with extra sedation/somnolence). Patient not taking: Reported on 11/13/2014 10/11/14   Amy D Clegg, NP  bisacodyl (DULCOLAX) 10 MG suppository Place 1 suppository (10 mg total) rectally daily as needed for moderate constipation. 10/11/14   Amy D Clegg, NP   ondansetron (ZOFRAN) 4 MG tablet Take 1 tablet (4 mg total) by mouth every 8 (eight) hours as needed for nausea or vomiting. 10/11/14   Amy Estrella Deeds, NP  oxyCODONE-acetaminophen (PERCOCET) 10-325  MG per tablet Take 1 tablet by mouth every 4 (four) hours as needed for pain. Patient taking differently: Take 1-2 tablets by mouth every 4 (four) hours as needed for pain.  10/11/14   Amy D Clegg, NP  temazepam (RESTORIL) 7.5 MG capsule Take 1 capsule (7.5 mg total) by mouth at bedtime as needed for sleep. Patient not taking: Reported on 11/13/2014 10/11/14   Conrad Winfall, NP    Physical Exam: Filed Vitals:   11/14/14 0115 11/14/14 0145 11/14/14 0200 11/14/14 0241  BP: 98/56 98/75 91/55  103/55  Pulse: 77 71 77 80  Temp:    98.4 F (36.9 C)  TempSrc:    Oral  Resp: 11 28 26 18   Height:    5\' 10"  (1.778 m)  Weight:    99.519 kg (219 lb 6.4 oz)  SpO2: 100% 100% 100% 100%   General: Not in acute distress HEENT:       Eyes: PERRL, EOMI, no scleral icterus.       ENT: No discharge from the ears and nose, no pharynx injection, no tonsillar enlargement.        Neck: No JVD, no bruit, no mass felt. Heme: No neck lymph node enlargement. Cardiac: S1/S2, RRR, No murmurs, No gallops or rubs. Pulm: Decreased air movement on the right side. No rales, wheezing, rhonchi or rubs. HD cath over R chest wall with clean surroundings. Abd: Soft, distended, mildly tender over R side., no rebound pain, no organomegaly, BS present. Ext: No pitting leg edema bilaterally. 2+DP/PT pulse bilaterally. Musculoskeletal: No joint deformities, No joint redness or warmth, no limitation of ROM in spin. Skin: No rashes. There are three lesions over his feet, one on the top of each foot, not seem to be infected. There is another lesion over L foot heal, black in color, no discharge, hard to assess whether it is infected. Neuro: Alert, mildly confused, but oriented X3, cranial nerves II-XII grossly intact, moves all extremities Psych:  Patient is not psychotic, no suicidal or hemocidal ideation.  Labs on Admission:  Basic Metabolic Panel:  Recent Labs Lab 11/14/14 0006 11/14/14 0012 11/14/14 0329  NA 130* 133* 130*  K 4.7 4.6 4.5  CL 93* 96* 93*  CO2 26  --  26  GLUCOSE 124* 126* 132*  BUN 12 15 13   CREATININE 2.67* 2.50* 2.75*  CALCIUM 8.3*  --  8.1*   Liver Function Tests:  Recent Labs Lab 11/14/14 0006 11/14/14 0329  AST 329* 275*  ALT 236* 214*  ALKPHOS 119 108  BILITOT 0.8 0.9  PROT 7.3 6.4*  ALBUMIN 2.9* 2.7*    Recent Labs Lab 11/14/14 0329  LIPASE 20*   No results for input(s): AMMONIA in the last 168 hours. CBC:  Recent Labs Lab 11/14/14 0006 11/14/14 0012 11/14/14 0500  WBC 6.0  --  5.9  NEUTROABS 4.1  --   --   HGB 9.1* 11.2* 8.9*  HCT 29.2* 33.0* 28.0*  MCV 101.7*  --  101.8*  PLT 228  --  204   Cardiac Enzymes:  Recent Labs Lab 11/14/14 0006  TROPONINI 0.06*    BNP (last 3 results)  Recent Labs  05/22/14 1029 05/27/14 1126 06/19/14 1030  BNP 935.2* 1627.8* 738.9*    ProBNP (last 3 results)  Recent Labs  11/14/13 0926 01/14/14 1112  PROBNP 1261.0* 5769.0*    CBG:  Recent Labs Lab 11/13/14 2257  GLUCAP 130*    Radiological Exams on Admission: Ct Head Wo Contrast  11/14/2014   CLINICAL DATA:  Altered mental status.  EXAM: CT HEAD WITHOUT CONTRAST  TECHNIQUE: Contiguous axial images were obtained from the base of the skull through the vertex without intravenous contrast.  COMPARISON:  08/25/2014, 02/01/2014  FINDINGS: There is no intracranial hemorrhage or extra-axial fluid collection. There is no evidence of acute infarction. There is encephalomalacia due to remote right posterior MCA infarction. There is mild generalized atrophy. There is mild periventricular hypodensity consistent with chronic small vessel disease. Prominent pituitary soft tissues are consistent with the previously described adenoma. Visible paranasal sinuses are clear.   IMPRESSION: 1. Remote right MCA infarction. 2. Unchanged pituitary adenoma. 3. Mild generalized atrophy and chronic small vessel disease. 4. No acute findings   Electronically Signed   By: Andreas Newport M.D.   On: 11/14/2014 00:53   Dg Chest Port 1 View  11/13/2014   CLINICAL DATA:  Short of breath  EXAM: PORTABLE CHEST 1 VIEW  COMPARISON:  09/04/2014  FINDINGS: Tunneled right internal jugular dialysis catheter in place. Tip is at the cavoatrial junction. Right PICC is been removed. Left subclavian AICD device and leads are stable. Large right pleural effusion has developed. This obscures the right lower lung.  IMPRESSION: New large right pleural effusion.   Electronically Signed   By: Marybelle Killings M.D.   On: 11/13/2014 23:43    EKG: Independently reviewed.  Abnormal findings: QTC 597, LAD, widening QRS wave, poor R-wave progression, which is similar to previous EKG of 10/09/14 Assessment/Plan Principal Problem:   Acute encephalopathy Active Problems:   Essential hypertension, benign   Automatic implantable cardioverter-defibrillator in situ   DM2 (diabetes mellitus, type 2) ([place])   Depression with anxiety   OSA (obstructive sleep apnea), intolerant to CPAP/BIPAP   Antiphospholipid antibody positive   Paroxysmal atrial fibrillation ([facility])   Chronic systolic CHF (congestive heart failure) ([place])   Encounter for palliative care   ESRD (end stage renal disease) on dialysis ([place])   Abdominal pain   Foot lesion   Pleural effusion, right   Acute encephalopathy: Etiology is not clear. CT head has no acute findings. Differential diagnosis include delirium, sedative meds,  possible infection in lung or feet. Patient is not septic on admission.  -will admit to tele bed -ED started empiric antibiotics, vancomycin and Zosyn, will continue for possible infection in lung and feet. -hold Xanax and Temazepam -Frequent neuro check -PTOT  Essential hypertension: Blood pressure is soft on admission.  Actually patient is on midodrine at home -continue midodrine  DM-II: Last A1c 7.7 on 06/26/14, not well controled. Patient is taking Lantus at home -will decrease Lantus dose from 15 to 10 units daily -SSI  Depression with anxiety: -hold Xanax due to AMS -continue cymbalta  Atrial Fibrillation: CHA2DS2-VASc Score is 4, needs oral anticoagulation. Patient is on Coumadin at home. INR is 2.94 on admission. Heart rate is well controlled. -continue Coumadin per pharmacy -Amiodarone  Chronic systolic CHF (congestive heart failure) ([place]): 2-D echo on 02/02/14 showed EF 20-25%. CHF is compensated -Managed by dialysis  ESRD (end stage renal disease) on dialysis ([facility]): Creatinine 2.5, BUN 15 -Left message infrarenal for dialysis  Abdominal pain: Etiology is not clear. -CT abdomen/pelvis -Check lipase  Pleural effusion, right: Etiology is not clear. May be due to congestive heart failure. Patient has mild cough and shortness of breath, but no chest pain or leukocytosis. Patient is not septic on admission. -ED started IV vancomycin and Zosyn for possible pneumonia, will continue.  Foot lesion: not  sure whether infected. -Consult to wound care. - On IV ABx as above.   DVT ppx: on Coumadin  Code Status: DNR Family Communication:  Yes, patient's wife at bed side Disposition Plan: Admit to inpatient   Date of Service 11/14/2014    Ivor Costa [facility] Pager [phone]  If 7PM-7AM, please contact night-coverage [email or web address] Password [email or web address] 11/14/2014, 7:14 AM

## 2014-11-14 NOTE — Consult Note (Signed)
Reason for Consult:Right hemothorax Referring Physician: Atzel [name] is an 64 y.o. male.  HPI: Larry [name] was admitted to the hospital by the medicine service yesterday for AMS. He has a very complicated medical history. Of note, he was admitted in mid-July and stayed >1 month before discharge to a SNF. At that time he was noted to have a large RP hematoma likely from a fall. During his stay at the SNF he has fallen 3 times. The family were suspicious of rib fractures but two x-rays failed to show any. As part of the workup this admission an abdominal CT showed 2 right rib fxs and a large, heterogenous, compressive effusion that is likely a hemothorax. Trauma surgery was asked to consult. The patient denies CP or SOB but admits that something "doesn't feel right" with his breathing.   Past Medical History  Diagnosis Date  . NICM (nonischemic cardiomyopathy) ([place])     a. NICM. EF 2011 25-30%. b. LHC 2009 nonobstructive CAD. c. h/o St[employer] Jude ICD 2012. d. Echo 04/2013 - EF 25-30% with mildly decreased RV systolic function. e. Echo 01/2014: EF 20-25%, inf akinesis, RHC. e. Home milrinone started 4/16.  [name] CAD (coronary artery disease)     a. nonobstructive CAD by cath 2009 & 06/2014.  . Obesity   . CVA (cerebral vascular accident) ([place])     CVA 2007 without residual deficit  . Pituitary tumor ([place])      (nonfunctionging pituitary microadenoma) s/p gamma knife surgery at [facility] 2009 with neuropathy and retinopathy  . Depression   . Erectile dysfunction   . DDD (degenerative disc disease)     Chronic low back pain.   . OSA (obstructive sleep apnea)     a. 4/16 sleep study with severe OSA  . Monoclonal gammopathy   . CKD (chronic kidney disease), stage III   . 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 Polycythemia, secondary     improved/resolved  . Hypercholesterolemia   . Back pain     F/B [name]  . Monoclonal gammopathy of unknown  significance     per [name]  . Neck pain     F/B [name]  . PAF (paroxysmal atrial fibrillation) ([place]) 06/04/10    a. On Xarelto. b. DCCV 02/2014, 04/2014 to NSR.  [name] History of diverticulitis of colon   . Type II or unspecified type diabetes mellitus with neurological manifestations, uncontrolled   . Nonproliferative diabetic retinopathy NOS(362.03)   . Ventricular tachycardia ([place]) 10/25/13    appropriate ICD shock, VT CL 230-240 msec  . Confusion 02/01/2014  . Hypertension   . AICD (automatic cardioverter/defibrillator) present   . Anxiety   . Peripheral vascular disease ([place])     2007  . Hypothyroidism   . PTSD (post-traumatic stress disorder)     a. Anxiety/PTSD from ICD shock  . Lupus anticoagulant positive     a. No h/o DVT. Saw Dr Lindi Adie with hematology.   . Pulmonary hypertension ([place])     a. Mixed pulmonary venous hypertension and PAH  . Chronic respiratory failure ([place])     a. As of 08/2014 requiring home O2 with ambulation due to desat.    Past Surgical History  Procedure Laterality Date  . Gamma knife surgery for pituitary tumor    . Tonsillectomy    . Cardiac defibrillator placement  02/19/10    By JA.   . Right heart catheterization N/A 09/17/2013  Procedure: RIGHT HEART CATH;  Surgeon: [name], MD;  Location: [facility] CATH LAB;  Service: Cardiovascular;  Laterality: N/A;  . Cardiac catheterization    . Brain surgery    . Insert / replace / remove pacemaker    . Cardioversion N/A 02/14/2014    Procedure: CARDIOVERSION;  Surgeon: [name], MD;  Location: [place];  Service: Cardiovascular;  Laterality: N/A;  . Cardioversion N/A 05/02/2014    Procedure: CARDIOVERSION;  Surgeon: [name], MD;  Location: [facility] ENDOSCOPY;  Service: Cardiovascular;  Laterality: N/A;  . Right heart catheterization N/A 05/08/2014    Procedure: RIGHT HEART CATH;  Surgeon: [name], MD;  Location: [facility] CATH LAB;  Service: Cardiovascular;  Laterality: N/A;  .  Cardiac catheterization N/A 06/26/2014    Procedure: Right/Left Heart Cath And Coronary Angiography;  Surgeon: [name], MD;  Location: [place] CV LAB;  Service: Cardiovascular;  Laterality: N/A;  . Cardiac catheterization N/A 08/21/2014    Procedure: Right Heart Cath;  Surgeon: [name], MD;  Location: [place] CV LAB;  Service: Cardiovascular;  Laterality: N/A;  . Multiple extractions with alveoloplasty N/A 08/22/2014    Procedure: Extraction of toothy #'s 3,5,6,7,9,10,11,12,13,14,20,21,22,23,27,28,29,30 with alveoloplasty and bilateral mandibular tori reductions;  Surgeon: Lenn Cal, DDS;  Location: [place];  Service: Oral Surgery;  Laterality: N/A;  . Cataract extraction    . Femur im nail Left 08/26/2014    Procedure: INTRAMEDULLARY (IM) NAIL FEMORAL;  Surgeon: [name], MD;  Location: [place];  Service: Orthopedics;  Laterality: Left;  . Av fistula placement Right 10/01/2014    Procedure: Creation of  Right Arm  ARTERIOVENOUS Fistula;  Surgeon: [name], MD;  Location: [facility] OR;  Service: Vascular;  Laterality: Right;    Family History  Problem Relation Age of Onset  . Lung cancer    . Cancer Mother     skin  . Dementia Mother   . Depression Mother   . Heart disease Father 82  . Hypertension Father   . Lung cancer Father   . Obesity Father   . Obesity Sister   . Hypertension Sister   . Diabetes Brother   . Hypertension Brother   . Heart disease Brother 78    Died of stroke MI age 77  . Obesity Brother   . Lung cancer Paternal Uncle   . Diabetes Paternal Uncle     requiring leg amputations     Social History:  reports that he quit smoking about 31 years ago. His smoking use included Cigars. He has never used smokeless tobacco. He reports that he does not drink alcohol or use illicit drugs.  Allergies:  Allergies  Allergen Reactions  . Bydureon [Exenatide] Other (See Comments)    sweating  . Losartan Potassium Other (See Comments)     insomnia  . Gabapentin Other (See Comments)    confusion    Medications: I have reviewed the patient's current medications.  Results for orders placed or performed during the hospital encounter of 11/13/14 (from the past 48 hour(s))  CBG monitoring, ED     Status: Abnormal   Collection Time: 11/13/14 10:57 PM  Result Value Ref Range   Glucose-Capillary 130 (H) 65 - 99 mg/dL  Comprehensive metabolic panel     Status: Abnormal   Collection Time: 11/14/14 12:06 AM  Result Value Ref Range   Sodium 130 (L) 135 - 145 mmol/L   Potassium 4.7 3.5 - 5.1 mmol/L  Chloride 93 (L) 101 - 111 mmol/L   CO2 26 22 - 32 mmol/L   Glucose, Bld 124 (H) 65 - 99 mg/dL   BUN 12 6 - 20 mg/dL   Creatinine, Ser 2.67 (H) 0.61 - 1.24 mg/dL   Calcium 8.3 (L) 8.9 - 10.3 mg/dL   Total Protein 7.3 6.5 - 8.1 g/dL   Albumin 2.9 (L) 3.5 - 5.0 g/dL   AST 329 (H) 15 - 41 U/L   ALT 236 (H) 17 - 63 U/L   Alkaline Phosphatase 119 38 - 126 U/L   Total Bilirubin 0.8 0.3 - 1.2 mg/dL   GFR calc non Af Amer 24 (L) >60 mL/min   GFR calc Af Amer 28 (L) >60 mL/min    Comment: (NOTE) The eGFR has been calculated using the CKD EPI equation. This calculation has not been validated in all clinical situations. eGFR's persistently <60 mL/min signify possible Chronic Kidney Disease.    Anion gap 11 5 - 15  CBC WITH DIFFERENTIAL     Status: Abnormal   Collection Time: 11/14/14 12:06 AM  Result Value Ref Range   WBC 6.0 4.0 - 10.5 K/uL   RBC 2.87 (L) 4.22 - 5.81 MIL/uL   Hemoglobin 9.1 (L) 13.0 - 17.0 g/dL   HCT 29.2 (L) 39.0 - 52.0 %   MCV 101.7 (H) 78.0 - 100.0 fL   MCH 31.7 26.0 - 34.0 pg   MCHC 31.2 30.0 - 36.0 g/dL   RDW 19.5 (H) 11.5 - 15.5 %   Platelets 228 150 - 400 K/uL   Neutrophils Relative % 69 %   Neutro Abs 4.1 1.7 - 7.7 K/uL   Lymphocytes Relative 12 %   Lymphs Abs 0.7 0.7 - 4.0 K/uL   Monocytes Relative 15 %   Monocytes Absolute 0.9 0.1 - 1.0 K/uL   Eosinophils Relative 3 %   Eosinophils Absolute 0.2  0.0 - 0.7 K/uL   Basophils Relative 1 %   Basophils Absolute 0.1 0.0 - 0.1 K/uL  Protime-INR     Status: Abnormal   Collection Time: 11/14/14 12:06 AM  Result Value Ref Range   Prothrombin Time 30.2 (H) 11.6 - 15.2 seconds   INR 2.94 (H) 0.00 - 1.49  Troponin I     Status: Abnormal   Collection Time: 11/14/14 12:06 AM  Result Value Ref Range   Troponin I 0.06 (H) <0.031 ng/mL    Comment:        PERSISTENTLY INCREASED TROPONIN VALUES IN THE RANGE OF 0.04-0.49 ng/mL CAN BE SEEN IN:       -UNSTABLE ANGINA       -CONGESTIVE HEART FAILURE       -MYOCARDITIS       -CHEST TRAUMA       -ARRYHTHMIAS       -LATE PRESENTING MYOCARDIAL INFARCTION       -COPD   CLINICAL FOLLOW-UP RECOMMENDED.   I-Stat CG4 Lactic Acid, ED  (not at  [facility])     Status: None   Collection Time: 11/14/14 12:12 AM  Result Value Ref Range   Lactic Acid, Venous 1.62 0.5 - 2.0 mmol/L  I-stat chem 8, ed     Status: Abnormal   Collection Time: 11/14/14 12:12 AM  Result Value Ref Range   Sodium 133 (L) 135 - 145 mmol/L   Potassium 4.6 3.5 - 5.1 mmol/L   Chloride 96 (L) 101 - 111 mmol/L   BUN 15 6 - 20 mg/dL   Creatinine, Ser  2.50 (H) 0.61 - 1.24 mg/dL   Glucose, Bld 126 (H) 65 - 99 mg/dL   Calcium, Ion 1.03 (L) 1.13 - 1.30 mmol/L   TCO2 24 0 - 100 mmol/L   Hemoglobin 11.2 (L) 13.0 - 17.0 g/dL   HCT 33.0 (L) 39.0 - 52.0 %  I-Stat arterial blood gas, ED     Status: Abnormal   Collection Time: 11/14/14 12:45 AM  Result Value Ref Range   pH, Arterial 7.402 7.350 - 7.450   pCO2 arterial 41.8 35.0 - 45.0 mmHg   pO2, Arterial 105.0 (H) 80.0 - 100.0 mmHg   Bicarbonate 26.1 (H) 20.0 - 24.0 mEq/L   TCO2 27 0 - 100 mmol/L   O2 Saturation 98.0 %   Acid-Base Excess 1.0 0.0 - 2.0 mmol/L   Patient temperature 98.2 F    Collection site RADIAL, ALLEN'S TEST ACCEPTABLE    Drawn by RT    Sample type ARTERIAL   MRSA PCR Screening     Status: None   Collection Time: 11/14/14  3:05 AM  Result Value Ref Range   MRSA by PCR  NEGATIVE NEGATIVE    Comment:        The GeneXpert MRSA Assay (FDA approved for NASAL specimens only), is one component of a comprehensive MRSA colonization surveillance program. It is not intended to diagnose MRSA infection nor to guide or monitor treatment for MRSA infections.   Lipase, blood     Status: Abnormal   Collection Time: 11/14/14  3:29 AM  Result Value Ref Range   Lipase 20 (L) 22 - 51 U/L  Comprehensive metabolic panel     Status: Abnormal   Collection Time: 11/14/14  3:29 AM  Result Value Ref Range   Sodium 130 (L) 135 - 145 mmol/L   Potassium 4.5 3.5 - 5.1 mmol/L   Chloride 93 (L) 101 - 111 mmol/L   CO2 26 22 - 32 mmol/L   Glucose, Bld 132 (H) 65 - 99 mg/dL   BUN 13 6 - 20 mg/dL   Creatinine, Ser 2.75 (H) 0.61 - 1.24 mg/dL   Calcium 8.1 (L) 8.9 - 10.3 mg/dL   Total Protein 6.4 (L) 6.5 - 8.1 g/dL   Albumin 2.7 (L) 3.5 - 5.0 g/dL   AST 275 (H) 15 - 41 U/L   ALT 214 (H) 17 - 63 U/L   Alkaline Phosphatase 108 38 - 126 U/L   Total Bilirubin 0.9 0.3 - 1.2 mg/dL   GFR calc non Af Amer 23 (L) >60 mL/min   GFR calc Af Amer 27 (L) >60 mL/min    Comment: (NOTE) The eGFR has been calculated using the CKD EPI equation. This calculation has not been validated in all clinical situations. eGFR's persistently <60 mL/min signify possible Chronic Kidney Disease.    Anion gap 11 5 - 15  CBC     Status: Abnormal   Collection Time: 11/14/14  5:00 AM  Result Value Ref Range   WBC 5.9 4.0 - 10.5 K/uL   RBC 2.75 (L) 4.22 - 5.81 MIL/uL   Hemoglobin 8.9 (L) 13.0 - 17.0 g/dL    Comment: DELTA CHECK NOTED REPEATED TO VERIFY    HCT 28.0 (L) 39.0 - 52.0 %   MCV 101.8 (H) 78.0 - 100.0 fL   MCH 32.4 26.0 - 34.0 pg   MCHC 31.8 30.0 - 36.0 g/dL   RDW 20.0 (H) 11.5 - 15.5 %   Platelets 204 150 - 400 K/uL    Ct  Abdomen Pelvis Wo Contrast  11/14/2014   ADDENDUM REPORT: 11/14/2014 08:10 ADDENDUM: Study discussed by telephone with Dr. Ree Kida On 11/14/2014 at 0800 hours.  Electronically Signed   By: Genevie Ann M.D.   On: 11/14/2014 08:10  11/14/2014   CLINICAL DATA:  64 year old male with constant right side abdominal pain. Retroperitoneal hemorrhage in August. Subsequent encounter.  EXAM: CT ABDOMEN AND PELVIS WITHOUT CONTRAST  TECHNIQUE: Multidetector CT imaging of the abdomen and pelvis was performed following the standard protocol without IV contrast.  COMPARISON:  09/15/2014 and earlier.  FINDINGS: Increased right pleural effusion, now almost completely opacifying the visible right lung base. On this noncontrast exam there are multiple round and nodular intermediate density foci within the right pleural space, individually up to 4 cm (series 2, image 19), new since August. The right lower lobe is partially collapsed and its airways partially filled with fluid ( "Drowned lung" ). There are parenchymal calcifications suggesting granulomas.  Cardiomegaly with no pericardial effusion. No left pleural effusion and left lower lobe ventilation has improved.  There are displaced lateral right eighth and ninth rib fractures which are new since August (series 3). These are in proximity to the intermediate density material in the right pleural space. Lower thoracic and lumbar vertebrae appear intact. Bilateral SI joint ankylosis. Degenerative changes at the hips. Proximal left femur orthopedic hardware. No other acute osseous abnormality identified.  Bilateral flank anasarca has increased and now tracks to the pelvis. The large right retroperitoneal hemorrhage has decreased but not resolved, now encompassing 12 x 15.5 x 14 cm (AP by transverse by CC) versus 17 x 19 x 24 cm previously. Layering hematocrit again noted. The right psoas muscle is less abnormal now. Regional mass effect including anterior displacement of the right kidney is stable to mildly regressed.  Interval regressed free fluid in the abdomen.  Stable presacral stranding. Stable distal colon with intermittent retained stool.  There is no longer gas in the urinary bladder. Diverticulosis of the sigmoid colon with no definite active inflammation. Mild gaseous distension of the left colon, transverse colon and right colon. Oral contrast has not reached the distal small bowel. No dilated or abnormal small bowel loops. Fat containing umbilical hernia. Negative stomach and duodenum.  The gallbladder is mildly distended, no pericholecystic stranding. Stable noncontrast liver parenchyma, spleen, pancreas, adrenal glands, and kidneys. Aortoiliac calcified atherosclerosis noted. Stable maximal celiac axis lymph nodes up to 14 mm.  IMPRESSION: 1. Large volume right hemothorax with new displaced right lateral eighth and ninth rib fractures. Complete atelectasis of the visible right lung. 2. Regressed but not resolved large right retroperitoneal hemorrhage since August. Residual hemorrhage volume approximates 1,302 mL. 3. No new abnormality in the abdomen or pelvis.  Electronically Signed: By: Genevie Ann M.D. On: 11/14/2014 07:52   Ct Head Wo Contrast  11/14/2014   CLINICAL DATA:  Altered mental status.  EXAM: CT HEAD WITHOUT CONTRAST  TECHNIQUE: Contiguous axial images were obtained from the base of the skull through the vertex without intravenous contrast.  COMPARISON:  08/25/2014, 02/01/2014  FINDINGS: There is no intracranial hemorrhage or extra-axial fluid collection. There is no evidence of acute infarction. There is encephalomalacia due to remote right posterior MCA infarction. There is mild generalized atrophy. There is mild periventricular hypodensity consistent with chronic small vessel disease. Prominent pituitary soft tissues are consistent with the previously described adenoma. Visible paranasal sinuses are clear.  IMPRESSION: 1. Remote right MCA infarction. 2. Unchanged pituitary adenoma. 3. Mild generalized atrophy and  chronic small vessel disease. 4. No acute findings   Electronically Signed   By: Andreas Newport M.D.   On:  11/14/2014 00:53   Dg Chest Port 1 View  11/13/2014   CLINICAL DATA:  Short of breath  EXAM: PORTABLE CHEST 1 VIEW  COMPARISON:  09/04/2014  FINDINGS: Tunneled right internal jugular dialysis catheter in place. Tip is at the cavoatrial junction. Right PICC is been removed. Left subclavian AICD device and leads are stable. Large right pleural effusion has developed. This obscures the right lower lung.  IMPRESSION: New large right pleural effusion.   Electronically Signed   By: Marybelle Killings M.D.   On: 11/13/2014 23:43    Review of Systems  Unable to perform ROS: mental acuity  Respiratory: Negative for shortness of breath.   Cardiovascular: Negative for chest pain.   Blood pressure 91/53, pulse 75, temperature 98.3 F (36.8 C), temperature source Oral, resp. rate 20, height _0  (1.778 m), weight 99.519 kg (219 lb 6.4 oz), SpO2 100 %. Physical Exam  Constitutional: He appears well-developed and well-nourished. No distress.  HENT:  Head: Normocephalic and atraumatic.  Right Ear: External ear normal.  Left Ear: External ear normal.  Nose: Nose normal.  Mouth/Throat: Oropharynx is clear and moist. No oropharyngeal exudate.  Eyes: Conjunctivae are normal. Right eye exhibits no discharge. Left eye exhibits no discharge. No scleral icterus.  Neck: No tracheal deviation present. No thyromegaly present.  Cardiovascular: Normal rate and normal heart sounds.  Exam reveals no gallop and no friction rub.   No murmur heard. Respiratory: Effort normal. No stridor. No respiratory distress. He has decreased breath sounds in the right middle field and the right lower field. He has no wheezes. He has no rales. He exhibits tenderness.  GI: Soft. Bowel sounds are normal. There is no tenderness. There is no rebound and no guarding.  Musculoskeletal: Normal range of motion.  Lymphadenopathy:    He has no cervical adenopathy.  Neurological: He is alert.  Skin: Skin is warm and dry. He is not diaphoretic.   Psychiatric: His affect is blunt. His speech is delayed. He is slowed.    Assessment/Plan: Right rib fxs w/HTX -- Will place chest tube today after anticoagulation is reversed. Hopefully this will be sufficient but given unknown duration of hemothorax and appearance on CT a VATS is certainly a possibility.  Afib/DVT -- Will probably need to be anticoagulated again but given his complications thus far and his high fall risk a discussion needs to be had regarding risk/benefit of continuing. Multiple medical problems -- per primary service    Lisette Abu, PA-C Pager: [phone] General Trauma PA Pager: [phone] 11/14/2014, 10:57 AM

## 2014-11-14 NOTE — Procedures (Signed)
Chest Tube Insertion Procedure Note  Pre-operative Diagnosis: R HTX  Post-operative Diagnosis: R HTX  Procedure Details  Informed consent was obtained for the procedure, including sedation.  Risks of lung perforation, hemorrhage, arrhythmia, and adverse drug reaction were discussed.   After sterile skin prep, using standard technique, a 20 French tube was placed in the right anterior axillary line, nipple level.  Findings - over 2L dark bloody fluid         Specimens:  None              Complications:  None; patient tolerated the procedure well.         Disposition: floor         Condition: stable  Georganna Skeans, MD, MPH, FACS Trauma: [phone] General Surgery: [phone]

## 2014-11-14 NOTE — Progress Notes (Signed)
Report called to nurse Christy in [place].  Instructed pt on his way.  Karie Kirks, Therapist, sports.

## 2014-11-14 NOTE — Progress Notes (Signed)
At 1225 pt transferred off floor to 6N via stretcher.  Karie Kirks, Therapist, sports.

## 2014-11-14 NOTE — Progress Notes (Signed)
Spoke with  Silvestre Gunner [facility] asking if ok to give pt his meds and to start Vit K as ordered on the floor.  Instructed it was ok to do so and pt does not need to be NPO.  Karie Kirks, Therapist, sports.

## 2014-11-15 ENCOUNTER — Inpatient Hospital Stay (HOSPITAL_COMMUNITY): Payer: Commercial Managed Care - HMO

## 2014-11-15 DIAGNOSIS — J942 Hemothorax: Secondary | ICD-10-CM

## 2014-11-15 DIAGNOSIS — Z95810 Presence of automatic (implantable) cardiac defibrillator: Secondary | ICD-10-CM

## 2014-11-15 DIAGNOSIS — G9340 Encephalopathy, unspecified: Secondary | ICD-10-CM

## 2014-11-15 DIAGNOSIS — Z992 Dependence on renal dialysis: Secondary | ICD-10-CM

## 2014-11-15 DIAGNOSIS — G4733 Obstructive sleep apnea (adult) (pediatric): Secondary | ICD-10-CM

## 2014-11-15 DIAGNOSIS — I5022 Chronic systolic (congestive) heart failure: Secondary | ICD-10-CM

## 2014-11-15 DIAGNOSIS — I480 Paroxysmal atrial fibrillation: Secondary | ICD-10-CM

## 2014-11-15 DIAGNOSIS — J948 Other specified pleural conditions: Secondary | ICD-10-CM

## 2014-11-15 DIAGNOSIS — R760 Raised antibody titer: Secondary | ICD-10-CM

## 2014-11-15 DIAGNOSIS — N186 End stage renal disease: Secondary | ICD-10-CM

## 2014-11-15 LAB — URINALYSIS, ROUTINE W REFLEX MICROSCOPIC
GLUCOSE, UA: NEGATIVE mg/dL
Hgb urine dipstick: NEGATIVE
KETONES UR: 15 mg/dL — AB
Nitrite: POSITIVE — AB
PH: 5 (ref 5.0–8.0)
Protein, ur: 30 mg/dL — AB
SPECIFIC GRAVITY, URINE: 1.027 (ref 1.005–1.030)
Urobilinogen, UA: 1 mg/dL (ref 0.0–1.0)

## 2014-11-15 LAB — COMPREHENSIVE METABOLIC PANEL
ALBUMIN: 2.5 g/dL — AB (ref 3.5–5.0)
ALT: 175 U/L — AB (ref 17–63)
AST: 167 U/L — AB (ref 15–41)
Alkaline Phosphatase: 94 U/L (ref 38–126)
Anion gap: 12 (ref 5–15)
BILIRUBIN TOTAL: 1 mg/dL (ref 0.3–1.2)
BUN: 22 mg/dL — AB (ref 6–20)
CHLORIDE: 91 mmol/L — AB (ref 101–111)
CO2: 24 mmol/L (ref 22–32)
CREATININE: 3.78 mg/dL — AB (ref 0.61–1.24)
Calcium: 8.2 mg/dL — ABNORMAL LOW (ref 8.9–10.3)
GFR calc Af Amer: 18 mL/min — ABNORMAL LOW (ref >60)
GFR, EST NON AFRICAN AMERICAN: 16 mL/min — AB (ref >60)
GLUCOSE: 114 mg/dL — AB (ref 65–99)
POTASSIUM: 4.8 mmol/L (ref 3.5–5.1)
Sodium: 127 mmol/L — ABNORMAL LOW (ref 135–145)
Total Protein: 6.3 g/dL — ABNORMAL LOW (ref 6.5–8.1)

## 2014-11-15 LAB — CBC
HEMATOCRIT: 30.4 % — AB (ref 39.0–52.0)
Hemoglobin: 9.5 g/dL — ABNORMAL LOW (ref 13.0–17.0)
MCH: 32.2 pg (ref 26.0–34.0)
MCHC: 31.3 g/dL (ref 30.0–36.0)
MCV: 103.1 fL — AB (ref 78.0–100.0)
PLATELETS: 210 10*3/uL (ref 150–400)
RBC: 2.95 MIL/uL — AB (ref 4.22–5.81)
RDW: 20.1 % — ABNORMAL HIGH (ref 11.5–15.5)
WBC: 6.2 10*3/uL (ref 4.0–10.5)

## 2014-11-15 LAB — PROTIME-INR
INR: 1.62 — AB (ref 0.00–1.49)
Prothrombin Time: 19.3 seconds — ABNORMAL HIGH (ref 11.6–15.2)

## 2014-11-15 LAB — URINE MICROSCOPIC-ADD ON

## 2014-11-15 LAB — HEPATITIS B SURFACE ANTIGEN: HEP B S AG: NEGATIVE

## 2014-11-15 LAB — GLUCOSE, CAPILLARY: GLUCOSE-CAPILLARY: 142 mg/dL — AB (ref 65–99)

## 2014-11-15 MED ORDER — MIDODRINE HCL 5 MG PO TABS
ORAL_TABLET | ORAL | Status: AC
  Filled 2014-11-15: qty 2

## 2014-11-15 MED ORDER — VANCOMYCIN HCL IN DEXTROSE 1-5 GM/200ML-% IV SOLN
1000.0000 mg | INTRAVENOUS | Status: DC
  Administered 2014-11-15 – 2014-11-20 (×3): 1000 mg via INTRAVENOUS
  Filled 2014-11-15 (×5): qty 200

## 2014-11-15 MED ORDER — PIPERACILLIN-TAZOBACTAM IN DEX 2-0.25 GM/50ML IV SOLN
2.2500 g | Freq: Three times a day (TID) | INTRAVENOUS | Status: DC
  Administered 2014-11-15 – 2014-11-21 (×17): 2.25 g via INTRAVENOUS
  Filled 2014-11-15 (×22): qty 50

## 2014-11-15 NOTE — Progress Notes (Signed)
Patient ID: Larry [name], male   DOB: [birth year], 64 y.o.   MRN: [id number]   LOS: 1 day   Subjective: No new c/o.   Objective: Vital signs in last 24 hours: Temp:  [97.6 F (36.4 C)-98.3 F (36.8 C)] 97.6 F (36.4 C) (10/07 0703) Pulse Rate:  [70-88] 71 (10/07 0930) Resp:  [12-20] 18 (10/07 0530) BP: (79-112)/(47-90) 82/48 mmHg (10/07 0930) SpO2:  [91 %-100 %] 100 % (10/07 0530) Weight:  [98.2 kg (216 lb 7.9 oz)] 98.2 kg (216 lb 7.9 oz) (10/07 0703) Last BM Date: 11/12/14   CT No air leak @310ml    Laboratory  CBC  Recent Labs  11/14/14 0500 11/15/14 0409  WBC 5.9 6.2  HGB 8.9* 9.5*  HCT 28.0* 30.4*  PLT 204 210   BMET  Recent Labs  11/14/14 0329 11/15/14 0409  NA 130* 127*  K 4.5 4.8  CL 93* 91*  CO2 26 24  GLUCOSE 132* 114*  BUN 13 22*  CREATININE 2.75* 3.78*  CALCIUM 8.1* 8.2*    Radiology Resuls PORTABLE CHEST 1 VIEW  COMPARISON: November 14, 2014  FINDINGS: Chest tube is present on the right. There is a minimal right apical pneumothorax. There is persistent pleural effusion with patchy airspace consolidation in the mid and lower lung zones on the right. Left lung is clear except for mild left base atelectasis. Heart is enlarged with pulmonary vascularity within normal limits. Pacemaker leads are attached to the right atrium and right ventricle. Central catheter tip is in the right atrium slightly beyond the cavoatrial junction. There is again noted a displaced right lateral eighth rib fracture.  IMPRESSION: Minimal right apical pneumothorax. Right effusion as well as airspace consolidation in the right mid and lower lung zones is present. Displaced right eighth rib fracture again noted. Left lung clear except for mild left base atelectasis. Stable cardiomegaly.   Electronically Signed  By: Lowella Grip III M.D.  On: 11/15/2014 08:00   Physical Exam General appearance: alert and no distress Resp: clear to  auscultation bilaterally Cardio: RRR   Assessment/Plan: Fall Right rib fxs w/HPTX -- CT to 30cm Multiple medical problems -- per primary service    Lisette Abu, PA-C Pager: [phone] General Trauma PA Pager: [phone]  11/15/2014

## 2014-11-15 NOTE — Clinical Social Work Note (Signed)
Clinical Social Work Assessment  Patient Details  Name: Larry [name] MRN: [id number] Date of Birth: Mar 25, 1950  Date of referral:  11/15/14               Reason for consult:  Facility Placement ([facility] (Short Term) )                Permission sought to share information with:  Family Supports Permission granted to share information::     Name::     Hasten Sweitzer   Relationship::  daughter   Contact Information:  [phone]  Housing/Transportation Living arrangements for the past 2 months:   (For the past 2 month the pt has been between home and SNF) Source of Information:  Adult Children Patient Interpreter Needed:  None Criminal Activity/Legal Involvement Pertinent to Current Situation/Hospitalization:  No - Comment as needed Significant Relationships:  Adult Children, Spouse Lives with:  Spouse Do you feel safe going back to the place where you live?  No Need for family participation in patient care:  Yes (Comment)  Care giving concerns: Amber expressed concerns with Gloria's ability to care for the pt at home. Amber shared that she prefers the pt to continue rehab at a SNF.   Social Worker assessment / plan: CSW attempted to call the pt's wife Peter Congo. CSW left a voice mail for her. CSW called the pt's daughter Museum/gallery conservator.  CSW introduced self and purpose of the call. CSW and Museum/gallery conservator explored the pt's discharge plan. Amber reported feeling confident that the pt will return back to rehab. Amber reported being unsure if the pt will return back to [facility]. Amber reported that she will talk to [place]. CSW answered all questions in which Amber inquired about. CSW will continue to follow this pt and assist with discharge as needed.   Employment status:  Disabled (Comment on whether or not currently receiving Disability) Insurance information:  Managed Medicare PT Recommendations:  Not assessed at this time Information / Referral to community resources:   (NA)    Patient/Family's Response to care:  Amber shared the care that the medical team has provided for the pt has been well.   Patient/Family's Understanding of and Emotional Response to Diagnosis, Current Treatment, and Prognosis: Amber acknowledged the pt's condition. Amber informed the CSW that the pt has been ill over the past few months. Amber reported that the decline in the pt's health has been a burden on [place]. Amber shared that family will continue to seek the best care for the pt.    Emotional Assessment Appearance:   (Unable to Assess ) Attitude/Demeanor/Rapport:  Unable to Assess Affect (typically observed):  Unable to Assess Orientation:  Fluctuating Orientation (Suspected and/or reported Sundowners) Alcohol / Substance use:  Not Applicable Psych involvement (Current and /or in the community):  No (Comment)  Discharge Needs  Concerns to be addressed:  Denies Needs/Concerns at this time Readmission within the last 30 days:  No Current discharge risk:  None Barriers to Discharge:  [place], LCSW 11/15/2014, 1:46 PM

## 2014-11-15 NOTE — Progress Notes (Addendum)
[facility]                                                                              Patient Demographics  Larry [name], is a 64 y.o. male, DOB - 1950/08/04, KGM:[id number]  Admit date - 11/13/2014   Admitting Physician Ivor Costa, MD  Outpatient Primary MD for the patient is [name], MD  LOS - 1   Chief Complaint  Patient presents with  . Altered Mental Status      HPI on 11/14/2014 by Dr. Felisa Bonier is a 64 y.o. 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  The patient's wife, patient has been mildly confused in the past 7 days. He has mild cough and shortness of breath, but no chest pain, fever or chills. Patient complains of abdominal pain. It is located in the right abdomen, constant, mild, radiating to the right shoulder, which has been going on for few weeks after he had a fall. He is constipated, no nausea, vomiting, diarrhea. He does not have symptoms of UTI. Patient done does not seem to have unilateral weakness, vision changes, hearing loss.  In ED, patient was found to have WBC 6.0, temperature normal, no tachycardia, creatinine 2.50, BUN 15, potassium 4.6, lactate of 1.62, INR 2.94. Chest x-ray showed new large right pleural effusion. CT-head showed remote right MCA infarction, unchanged pituitary adenoma, mild generalized atrophy and chronic small vessel disease, no acute findings.   Assessment & Plan   Right Rib Fractures with hemothorax -Secondary to falls -Seen on CT abdomen -Trauma surgery consulted and appreciated, chest tube inserted -Coumadin discontiued  Acute encephalopathy -Likely  secondary to the above -CT head: no acute findings -Currently afebrile, no leukocytosis, however patient started on vanc/zosyn -Xanax and Temazepam held -PT recommended SNF  Essential hypertension -Blood pressure is soft on admission. -continue midodrine  Macrocytic Anemia -Hb 9.5 today,  drop likely secondary to hemothorax/hemorrhage -Baseline Hb 11 -Continue to monitor CBC  Diabetes mellitus, type 2 -Last A1c 7.7 on 06/26/14 -Continue lantus, ISS, CBG monitoring  Depression with anxiety: -hold Xanax due to AMS -continue cymbalta  Atrial Fibrillation -CHADSVASC 5 -Currently rate controlled -Was on coumadin, however, discontinued due to hemothorax and continued right retroperitoneal hemorrhage. -INR 1.62 (Vitamin K given on 11/14/2014) -Continue amiodarone -Discussed discontinuation of coumadin with wife.  She would like cardiology's input.  Antiphospholipid antibody -coumadin discontinued, discussed risks and benefits with the wife, she would like input from cardiology  Chronic systolic CHF (congestive heart failure) -Echocardiogram on 02/02/14 showed EF 20-25%.  -Currently compensated -Managed by dialysis  ESRD (end stage renal disease) on dialysis (MWFS) -Nephrology consulted and appreciated  Abdominal pain/ Large Right Retroperitoneal hemorrhage/ Elevated transaminases -CT abdomen/pelvis: no new abnormality  -Lipase 20 -AST/ALT improving- continue to monitor  Dyspnea/Cough -Secondary to above, continue treatment plan as above  Multiple wounds -Stage 3 left heel pressure ulcer, left dorsal wound, right dorsal wound -  Wound care consulted  Code Status: DNR  Family Communication: Wife at bedside  Disposition Plan: Admitted.   Time Spent in minutes   30 minutes  Procedures  None  Consults   Trauma surgery  DVT Prophylaxis  SCDs  Lab Results  Component Value Date   PLT 210 11/15/2014    Medications  Scheduled Meds: . amiodarone  200 mg Oral  Daily  . atorvastatin  40 mg Oral Daily  . bisacodyl  5-10 mg Oral QHS  . collagenase   Topical Daily  . DULoxetine  60 mg Oral BID  . insulin aspart  0-9 Units Subcutaneous TID WC  . insulin glargine  10 Units Subcutaneous QHS  . levothyroxine  75 mcg Oral QAC breakfast  . lidocaine-EPINEPHrine  30 mL Intradermal Once  . midodrine  10 mg Oral TID WC  . multivitamin  1 tablet Oral QHS  . piperacillin-tazobactam (ZOSYN)  IV  2.25 g Intravenous 3 times per day  . sevelamer carbonate  800 mg Oral TID WC  . sildenafil  80 mg Oral TID  . sodium chloride  3 mL Intravenous Q12H  . traZODone  50 mg Oral QHS  . vancomycin  1,000 mg Intravenous Q M,W,F-HD   Continuous Infusions:   PRN Meds:.acetaminophen, oxyCODONE-acetaminophen **AND** oxyCODONE, polyethylene glycol  Antibiotics    Anti-infectives    Start     Dose/Rate Route Frequency Ordered Stop   11/15/14 1400  piperacillin-tazobactam (ZOSYN) IVPB 2.25 g     2.25 g 100 mL/hr over 30 Minutes Intravenous 3 times per day 11/15/14 0755     11/15/14 1200  vancomycin (VANCOCIN) IVPB 1000 mg/200 mL premix     1,000 mg 200 mL/hr over 60 Minutes Intravenous Every M-W-F (Hemodialysis) 11/15/14 0754     11/15/14 0200  vancomycin (VANCOCIN) IVPB 1000 mg/200 mL premix  Status:  Discontinued     1,000 mg 200 mL/hr over 60 Minutes Intravenous Every 24 hours 11/14/14 0252 11/15/14 0753   11/14/14 0800  piperacillin-tazobactam (ZOSYN) IVPB 3.375 g  Status:  Discontinued     3.375 g 12.5 mL/hr over 240 Minutes Intravenous 3 times per day 11/14/14 0240 11/15/14 0755   11/14/14 0600  piperacillin-tazobactam (ZOSYN) IVPB 3.375 g  Status:  Discontinued     3.375 g 100 mL/hr over 30 Minutes Intravenous 3 times per day 11/14/14 0233 11/14/14 0239   11/14/14 0130  vancomycin (VANCOCIN) IVPB 1000 mg/200 mL premix     1,000 mg 200 mL/hr over 60 Minutes Intravenous  Once 11/14/14 0126 11/14/14 0259   11/14/14 0115  piperacillin-tazobactam (ZOSYN) IVPB  3.375 g     3.375 g 100 mL/hr over 30 Minutes Intravenous  Once 11/14/14 0109 11/14/14 0159      Subjective:   Larry [name] seen and examined today in dialysis.  Patient denies chest pain, abdominal pain, N/V/D/C.  Feels breathing has improved.  Would like dialysis to be cut short today.   Objective:   Filed Vitals:   11/15/14 0930 11/15/14 1000 11/15/14 1030 11/15/14 1100  BP: 82/48 87/54 84/48  89/48  Pulse: 71 69 72 72  Temp:      TempSrc:      Resp:      Height:      Weight:      SpO2:        Wt Readings from Last 3 Encounters:  11/15/14 98.2 kg (216 lb 7.9 oz)  10/11/14 97.9 kg (215 lb 13.3 oz)  08/24/14 102.4 kg (225 lb  12 oz)     Intake/Output Summary (Last 24 hours) at 11/15/14 1124 Last data filed at 11/15/14 0500  Gross per 24 hour  Intake      0 ml  Output   2600 ml  Net  -2600 ml    Exam  General: Well developed, well nourished, NAD  HEENT: NCAT, mucous membranes moist.   Cardiovascular: S1 S2 auscultated, RRR, no murmurs  Respiratory: Clear to ausculation (R chest tube in place)  Abdomen: Soft, nontender, nondistended, + bowel sounds  Extremities: warm dry without cyanosis clubbing  Neuro: AAOx2, nonfocal  Skin: Several small lesions on both feet- unna boots  Data Review   Micro Results Recent Results (from the past 240 hour(s))  Culture, blood (routine x 2)     Status: None (Preliminary result)   Collection Time: 11/13/14 11:48 PM  Result Value Ref Range Status   Specimen Description BLOOD LEFT FOREARM  Final   Special Requests BOTTLES DRAWN AEROBIC ONLY 3CC  Final   Culture NO GROWTH 1 DAY  Final   Report Status PENDING  Incomplete  Culture, blood (routine x 2)     Status: None (Preliminary result)   Collection Time: 11/14/14 12:05 AM  Result Value Ref Range Status   Specimen Description BLOOD LEFT ARM  Final   Special Requests BOTTLES DRAWN AEROBIC AND ANAEROBIC 5CC  Final   Culture NO GROWTH 1 DAY  Final   Report Status  PENDING  Incomplete  MRSA PCR Screening     Status: None   Collection Time: 11/14/14  3:05 AM  Result Value Ref Range Status   MRSA by PCR NEGATIVE NEGATIVE Final    Comment:        The GeneXpert MRSA Assay (FDA approved for NASAL specimens only), is one component of a comprehensive MRSA colonization surveillance program. It is not intended to diagnose MRSA infection nor to guide or monitor treatment for MRSA infections.     Radiology Reports Ct Abdomen Pelvis Wo Contrast  11/14/2014   ADDENDUM REPORT: 11/14/2014 08:10 ADDENDUM: Study discussed by telephone with Dr. Ree Kida On 11/14/2014 at 0800 hours. Electronically Signed   By: Genevie Ann M.D.   On: 11/14/2014 08:10  11/14/2014   CLINICAL DATA:  64 year old male with constant right side abdominal pain. Retroperitoneal hemorrhage in August. Subsequent encounter.  EXAM: CT ABDOMEN AND PELVIS WITHOUT CONTRAST  TECHNIQUE: Multidetector CT imaging of the abdomen and pelvis was performed following the standard protocol without IV contrast.  COMPARISON:  09/15/2014 and earlier.  FINDINGS: Increased right pleural effusion, now almost completely opacifying the visible right lung base. On this noncontrast exam there are multiple round and nodular intermediate density foci within the right pleural space, individually up to 4 cm (series 2, image 19), new since August. The right lower lobe is partially collapsed and its airways partially filled with fluid ( "Drowned lung" ). There are parenchymal calcifications suggesting granulomas.  Cardiomegaly with no pericardial effusion. No left pleural effusion and left lower lobe ventilation has improved.  There are displaced lateral right eighth and ninth rib fractures which are new since August (series 3). These are in proximity to the intermediate density material in the right pleural space. Lower thoracic and lumbar vertebrae appear intact. Bilateral SI joint ankylosis. Degenerative changes at the hips. Proximal  left femur orthopedic hardware. No other acute osseous abnormality identified.  Bilateral flank anasarca has increased and now tracks to the pelvis. The large right retroperitoneal hemorrhage has decreased but not resolved,  now encompassing 12 x 15.5 x 14 cm (AP by transverse by CC) versus 17 x 19 x 24 cm previously. Layering hematocrit again noted. The right psoas muscle is less abnormal now. Regional mass effect including anterior displacement of the right kidney is stable to mildly regressed.  Interval regressed free fluid in the abdomen.  Stable presacral stranding. Stable distal colon with intermittent retained stool. There is no longer gas in the urinary bladder. Diverticulosis of the sigmoid colon with no definite active inflammation. Mild gaseous distension of the left colon, transverse colon and right colon. Oral contrast has not reached the distal small bowel. No dilated or abnormal small bowel loops. Fat containing umbilical hernia. Negative stomach and duodenum.  The gallbladder is mildly distended, no pericholecystic stranding. Stable noncontrast liver parenchyma, spleen, pancreas, adrenal glands, and kidneys. Aortoiliac calcified atherosclerosis noted. Stable maximal celiac axis lymph nodes up to 14 mm.  IMPRESSION: 1. Large volume right hemothorax with new displaced right lateral eighth and ninth rib fractures. Complete atelectasis of the visible right lung. 2. Regressed but not resolved large right retroperitoneal hemorrhage since August. Residual hemorrhage volume approximates 1,302 mL. 3. No new abnormality in the abdomen or pelvis.  Electronically Signed: By: Genevie Ann M.D. On: 11/14/2014 07:52   Ct Head Wo Contrast  11/14/2014   CLINICAL DATA:  Altered mental status.  EXAM: CT HEAD WITHOUT CONTRAST  TECHNIQUE: Contiguous axial images were obtained from the base of the skull through the vertex without intravenous contrast.  COMPARISON:  08/25/2014, 02/01/2014  FINDINGS: There is no intracranial  hemorrhage or extra-axial fluid collection. There is no evidence of acute infarction. There is encephalomalacia due to remote right posterior MCA infarction. There is mild generalized atrophy. There is mild periventricular hypodensity consistent with chronic small vessel disease. Prominent pituitary soft tissues are consistent with the previously described adenoma. Visible paranasal sinuses are clear.  IMPRESSION: 1. Remote right MCA infarction. 2. Unchanged pituitary adenoma. 3. Mild generalized atrophy and chronic small vessel disease. 4. No acute findings   Electronically Signed   By: Andreas Newport M.D.   On: 11/14/2014 00:53   Dg Chest Port 1 View  11/15/2014   CLINICAL DATA:  Traumatic hemopneumothorax  EXAM: PORTABLE CHEST 1 VIEW  COMPARISON:  November 14, 2014  FINDINGS: Chest tube is present on the right. There is a minimal right apical pneumothorax. There is persistent pleural effusion with patchy airspace consolidation in the mid and lower lung zones on the right. Left lung is clear except for mild left base atelectasis. Heart is enlarged with pulmonary vascularity within normal limits. Pacemaker leads are attached to the right atrium and right ventricle. Central catheter tip is in the right atrium slightly beyond the cavoatrial junction. There is again noted a displaced right lateral eighth rib fracture.  IMPRESSION: Minimal right apical pneumothorax. Right effusion as well as airspace consolidation in the right mid and lower lung zones is present. Displaced right eighth rib fracture again noted. Left lung clear except for mild left base atelectasis. Stable cardiomegaly.   Electronically Signed   By: Lowella Grip III M.D.   On: 11/15/2014 08:00   Dg Chest Port 1 View  11/14/2014   CLINICAL DATA:  64 year old male right side chest tube placement for hemothorax. Initial encounter.  EXAM: PORTABLE CHEST 1 VIEW  COMPARISON:  CT Abdomen and Pelvis 0733 hours today, and earlier  FINDINGS: Portable  AP view at 1611 hours. New right chest tube placed. Significantly reduced right pleural effusion/hemothorax  volume. Improved right lung ventilation. No pneumothorax identified. Displaced right eighth rib fracture re- identified.  Stable cardiomegaly and mediastinal contours. Stable right IJ approach dual lumen dialysis type catheter. Stable left chest cardiac pacemaker. Residual right lung base atelectasis.  IMPRESSION: Regressed right pleural effusion/ hemothorax and improved right lung ventilation status post right chest tube placement. No pneumothorax identified.   Electronically Signed   By: Genevie Ann M.D.   On: 11/14/2014 16:19   Dg Chest Port 1 View  11/13/2014   CLINICAL DATA:  Short of breath  EXAM: PORTABLE CHEST 1 VIEW  COMPARISON:  09/04/2014  FINDINGS: Tunneled right internal jugular dialysis catheter in place. Tip is at the cavoatrial junction. Right PICC is been removed. Left subclavian AICD device and leads are stable. Large right pleural effusion has developed. This obscures the right lower lung.  IMPRESSION: New large right pleural effusion.   Electronically Signed   By: Marybelle Killings M.D.   On: 11/13/2014 23:43    CBC  Recent Labs Lab 11/14/14 0006 11/14/14 0012 11/14/14 0500 11/15/14 0409  WBC 6.0  --  5.9 6.2  HGB 9.1* 11.2* 8.9* 9.5*  HCT 29.2* 33.0* 28.0* 30.4*  PLT 228  --  204 210  MCV 101.7*  --  101.8* 103.1*  MCH 31.7  --  32.4 32.2  MCHC 31.2  --  31.8 31.3  RDW 19.5*  --  20.0* 20.1*  LYMPHSABS 0.7  --   --   --   MONOABS 0.9  --   --   --   EOSABS 0.2  --   --   --   BASOSABS 0.1  --   --   --     Chemistries   Recent Labs Lab 11/14/14 0006 11/14/14 0012 11/14/14 0329 11/15/14 0409  NA 130* 133* 130* 127*  K 4.7 4.6 4.5 4.8  CL 93* 96* 93* 91*  CO2 26  --  26 24  GLUCOSE 124* 126* 132* 114*  BUN 12 15 13  22*  CREATININE 2.67* 2.50* 2.75* 3.78*  CALCIUM 8.3*  --  8.1* 8.2*  AST 329*  --  275* 167*  ALT 236*  --  214* 175*  ALKPHOS 119  --  108  94  BILITOT 0.8  --  0.9 1.0   ------------------------------------------------------------------------------------------------------------------ estimated creatinine clearance is 23.8 mL/min (by C-G formula based on Cr of 3.78). ------------------------------------------------------------------------------------------------------------------ No results for input(s): HGBA1C in the last 72 hours. ------------------------------------------------------------------------------------------------------------------ No results for input(s): CHOL, HDL, LDLCALC, TRIG, CHOLHDL, LDLDIRECT in the last 72 hours. ------------------------------------------------------------------------------------------------------------------ No results for input(s): TSH, T4TOTAL, T3FREE, THYROIDAB in the last 72 hours.  Invalid input(s): FREET3 ------------------------------------------------------------------------------------------------------------------ No results for input(s): VITAMINB12, FOLATE, FERRITIN, TIBC, IRON, RETICCTPCT in the last 72 hours.  Coagulation profile  Recent Labs Lab 11/14/14 0006 11/14/14 1316 11/15/14 0409  INR 2.94* 2.47* 1.62*    No results for input(s): DDIMER in the last 72 hours.  Cardiac Enzymes  Recent Labs Lab 11/14/14 0006  TROPONINI 0.06*   ------------------------------------------------------------------------------------------------------------------ Invalid input(s): POCBNP    Emmaleah Meroney D.O. on 11/15/2014 at 11:24 AM  Between 7am to 7pm - Pager - [phone]  After 7pm go to [email or web address] - password [email or web address]  And look for the night coverage person covering for me after hours  [facility] Office  [phone]

## 2014-11-15 NOTE — Progress Notes (Addendum)
Pharmacy Antibiotic Time-Out Note  Larry [name] is a 64 y.o. year-old male admitted on 11/13/2014.  The patient is currently on Vancomycin + Zosyn for diabetic foot infection.  Assessment The dose of Vancomycin & Zosyn will be adjusted based on renal function.  49 YOM with ESRD-MWF currently on broad spectrum antibiotics D#2 for a presumed diabetic foot infection.  The patient is ESRD-MWF and is currently dialyzing this morning. Will adjust Vancomycin doses to be given post-HD and will adjust Zosyn as well.  Also Warfarin remains on hold in the setting of hemothorax. Will follow-up plans to resume this in the future. INR 1.62 today - no bridging requested at this time.   Goal of Therapy: Vancomycin level of 15-25 mcg/ml  Plan 1. Adjust Vancomycin to 1g/HD-MWF 2. Adjust Zosyn to 2.25g IV every 8 hours 3. Continue holding warfarin for now.  4. Will continue to follow HD schedule/duration, culture results, LOT, and antibiotic de-escalation plans    Recent Labs Lab 11/14/14 0006 11/14/14 0500 11/15/14 0409  WBC 6.0 5.9 6.2    Recent Labs Lab 11/14/14 0006 11/14/14 0012 11/14/14 0329 11/15/14 0409  CREATININE 2.67* 2.50* 2.75* 3.78*   Estimated Creatinine Clearance: 23.8 mL/min (by C-G formula based on Cr of 3.78).   Tmax/24h: Afebrile  Antimicrobial allergies: n/a  Antimicrobials this admission: Vancomycin 10/6 >> Zosyn 10/6 >>  Levels/dose changes this admission: n/a  Microbiology Results: 10/5 Blood cx >> 10/5 Urine cx  >>  Thank you for allowing pharmacy to be a part of this patient's care.  Alycia Rossetti, PharmD, BCPS Clinical Pharmacist Pager: [phone] 11/15/2014 8:09 AM

## 2014-11-15 NOTE — Consult Note (Signed)
CARDIOLOGY CONSULT NOTE   Patient ID: Larry [name] MRN: [id number] DOB/AGE: [birth year] 62 y.o.  Admit date: 11/13/2014  Primary Physician   [name], MD Primary Cardiologist  Dr. Aundra Dubin Reason for Consultation   Anticoagulation management   HPI: Larry [name] is a 64 y.o. male with a history of chronic systolic CHF (nonischemic cardiomyopathy - EF 20-25%) s/p St. 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  CPX in 4/16 showed severe functional impairment but was submaximal. He was started on milrinone gtt in 4/16 but remained symptomatic. He returned in 5/16 for RHC/LHC. This showed nonobstructive CAD, severely elevated PCWP, and severe pulmonary hypertension (again, mixed PAH/PVH).   He had RHC again in 7/16, showing elevated right and left heart filling pressures and severe pulmonary HTN. Milrinone was increased to 0.5 and Revatio was increased. He was discharged home but had a fall with left femoral fracture. The fracture was repaired but he developed AKI post-op initially requiring CVVH then HD. He was able to come off inotropes after starting HD. He developed a large retroperitoneal hematoma in 8/16 and warfarin was stopped. After a long hospitalization, he was discharged to [facility] nursing [facility].  Last seen by Dr. Aundra Dubin 10/29/14. AT that time he was in NSR so his amiodarone reduced to 20mg  daily and restarted coumadin, aiming for INR 2-2.5. He has been getting HD 4 times a week due to volume overload.   In SNF, he fall 3 times in past few weeks. Recently he complained to mild abdominal pain. He brought to ED due to AMS x 5 days.  The family were suspicious of rib fractures but two x-rays failed to show any. As part of the workup this admission an abdominal CT showed 2 right  rib fxs and right hemothorax. CXR shows large R effusion, L is clear. CT of head without acute finding.   Trauma surgery consulted and chest tube inserted. Coumadin discontinued due to hemothorax and continued right retroperitoneal hemorrhage. Vitamin K given on 11/14/2014. INR of 1.62 today. Had HD today. Cardiology consulted for anticoagulation management per wife's request. Chest tube without air leak. CXR with small apical PTX. Currently patient in afib at controlled rate.  Last echo in 12/15 showed EF 20-25% with normal RV size.Per wife, hasn't had device interrogation in many months.   Past Medical History  Diagnosis Date  . NICM (nonischemic cardiomyopathy) ([place])     a. NICM. EF 2011 25-30%. b. LHC 2009 nonobstructive CAD. c. h/o St. Jude ICD 2012. d. Echo 04/2013 - EF 25-30% with mildly decreased RV systolic function. e. Echo 01/2014: EF 20-25%, inf akinesis, RHC. e. Home milrinone started 4/16.  [name] CAD (coronary artery disease)     a. nonobstructive CAD by cath 2009 & 06/2014.  . Obesity   . CVA (cerebral vascular accident) ([place])     CVA 2007 without residual deficit  . Pituitary tumor ([place])      (nonfunctionging pituitary microadenoma) s/p gamma knife surgery at [facility] 2009 with neuropathy and retinopathy  . Depression   . Erectile dysfunction   . DDD (degenerative disc disease)     Chronic low back pain.   . OSA (obstructive sleep apnea)     a. 4/16 sleep study with severe OSA  . 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 Polycythemia, secondary     improved/resolved  . Hypercholesterolemia   . Back pain     F/B [name]  . Monoclonal gammopathy of unknown significance     per [name]  . Neck pain     F/B [name]  . PAF (paroxysmal atrial fibrillation) ([place]) 06/04/10    a. On Xarelto. b. DCCV 02/2014, 04/2014 to NSR.  [name] History of diverticulitis of colon   . Type II or unspecified type diabetes mellitus with neurological  manifestations, uncontrolled   . Nonproliferative diabetic retinopathy NOS(362.03)   . Ventricular tachycardia ([place]) 10/25/13    appropriate ICD shock, VT CL 230-240 msec  . Hypertension   . AICD (automatic cardioverter/defibrillator) present   . Peripheral vascular disease ([place])     2007  . Hypothyroidism   . PTSD (post-traumatic stress disorder)     a. Anxiety/PTSD from ICD shock  . Lupus anticoagulant positive     a. No h/o DVT. Saw Dr Lindi Adie with hematology.   . Pulmonary hypertension ([place])     a. Mixed pulmonary venous hypertension and PAH  . Chronic respiratory failure ([place])     a. As of 08/2014 requiring home O2 with ambulation due to desat.     Past Surgical History  Procedure Laterality Date  . Gamma knife surgery for pituitary tumor    . Tonsillectomy    . Cardiac defibrillator placement  02/19/10    By JA.   . Right heart catheterization N/A 09/17/2013    Procedure: RIGHT HEART CATH;  Surgeon: [name], MD;  Location: [facility] CATH LAB;  Service: Cardiovascular;  Laterality: N/A;  . Cardiac catheterization    . Brain surgery    . Insert / replace / remove pacemaker    . Cardioversion N/A 02/14/2014    Procedure: CARDIOVERSION;  Surgeon: [name], MD;  Location: [place];  Service: Cardiovascular;  Laterality: N/A;  . Cardioversion N/A 05/02/2014    Procedure: CARDIOVERSION;  Surgeon: [name], MD;  Location: [facility] ENDOSCOPY;  Service: Cardiovascular;  Laterality: N/A;  . Right heart catheterization N/A 05/08/2014    Procedure: RIGHT HEART CATH;  Surgeon: [name], MD;  Location: [facility] CATH LAB;  Service: Cardiovascular;  Laterality: N/A;  . Cardiac catheterization N/A 06/26/2014    Procedure: Right/Left Heart Cath And Coronary Angiography;  Surgeon: [name], MD;  Location: [place] CV LAB;  Service: Cardiovascular;  Laterality: N/A;  . Cardiac catheterization N/A 08/21/2014    Procedure: Right Heart Cath;  Surgeon: [name], MD;   Location: [place] CV LAB;  Service: Cardiovascular;  Laterality: N/A;  . Multiple extractions with alveoloplasty N/A 08/22/2014    Procedure: Extraction of toothy #'s 3,5,6,7,9,10,11,12,13,14,20,21,22,23,27,28,29,30 with alveoloplasty and bilateral mandibular tori reductions;  Surgeon: Lenn Cal, DDS;  Location: [place];  Service: Oral Surgery;  Laterality: N/A;  . Cataract extraction    . Femur im nail Left 08/26/2014    Procedure: INTRAMEDULLARY (IM) NAIL FEMORAL;  Surgeon: [name], MD;  Location: [place];  Service: Orthopedics;  Laterality: Left;  . Av fistula placement Right 10/01/2014    Procedure: Creation of  Right Arm  ARTERIOVENOUS Fistula;  Surgeon: [name], MD;  Location: [place];  Service: Vascular;  Laterality: Right;    Allergies  Allergen Reactions  . Bydureon [Exenatide] Other (See Comments)    sweating  . Losartan Potassium Other (See Comments)    insomnia  .  Gabapentin Other (See Comments)    confusion    I have reviewed the patient's current medications . amiodarone  200 mg Oral Daily  . atorvastatin  40 mg Oral Daily  . bisacodyl  5-10 mg Oral QHS  . collagenase   Topical Daily  . DULoxetine  60 mg Oral BID  . insulin aspart  0-9 Units Subcutaneous TID WC  . insulin glargine  10 Units Subcutaneous QHS  . levothyroxine  75 mcg Oral QAC breakfast  . lidocaine-EPINEPHrine  30 mL Intradermal Once  . midodrine  10 mg Oral TID WC  . multivitamin  1 tablet Oral QHS  . piperacillin-tazobactam (ZOSYN)  IV  2.25 g Intravenous 3 times per day  . sevelamer carbonate  800 mg Oral TID WC  . sildenafil  80 mg Oral TID  . sodium chloride  3 mL Intravenous Q12H  . traZODone  50 mg Oral QHS  . vancomycin  1,000 mg Intravenous Q M,W,F-HD     acetaminophen, oxyCODONE-acetaminophen **AND** oxyCODONE, polyethylene glycol  Prior to Admission medications   Medication Sig Start Date End Date Taking? Authorizing Provider  acetaminophen (TYLENOL) 325 MG  tablet Take 650 mg by mouth every 4 (four) hours as needed for moderate pain or fever.   Yes Historical Provider, MD  ALPRAZolam Duanne Moron) 0.5 MG tablet Take 0.5 mg by mouth every 8 (eight) hours as needed for anxiety.   Yes Historical Provider, MD  amiodarone (PACERONE) 400 MG tablet Take 0.5 tablets (200 mg total) by mouth daily. 10/29/14  Yes [name], MD  atorvastatin (LIPITOR) 40 MG tablet TAKE 1 TABLET DAILY 03/18/14  Yes [name], MD  bisacodyl (DULCOLAX) 5 MG EC tablet Take 5 mg by mouth at bedtime.   Yes Historical Provider, MD  bisacodyl (DULCOLAX) 5 MG EC tablet Take 5-10 mg by mouth at bedtime. Take 5mg  at bedtime, take 10 mg at bedtime if no bowel movement during the day.   Yes Historical Provider, MD  chlorhexidine (PERIDEX) 0.12 % solution Perform mouth rinses twice daily after breakfast and at bedtime. Patient taking differently: Use as directed in the mouth or throat 2 (two) times daily. Perform mouth rinses twice daily after breakfast and at bedtime (swish and spit) 08/24/14  Yes Dayna N Dunn, PA-C  DULoxetine (CYMBALTA) 30 MG capsule Take 60 mg by mouth 2 (two) times daily.    Yes Historical Provider, MD  insulin glargine (LANTUS) 100 unit/mL SOPN Inject 15 Units into the skin at bedtime.    Yes Historical Provider, MD  insulin lispro (HUMALOG KWIKPEN) 100 UNIT/ML KiwkPen Inject 5-25 Units into the skin 3 (three) times daily as needed.    Yes Historical Provider, MD  levothyroxine (SYNTHROID, LEVOTHROID) 75 MCG tablet TAKE 1 TABLET ON AN EMPTY STOMACH 30 MINUTES BEFORE BREAKFAST 07/13/14  Yes Historical Provider, MD  midodrine (PROAMATINE) 10 MG tablet Take 1 tablet (10 mg total) by mouth 3 (three) times daily. 10/11/14  Yes Amy D Clegg, NP  multivitamin (RENA-VIT) TABS tablet Take 1 tablet by mouth at bedtime. 10/11/14  Yes Amy D Clegg, NP  polyethylene glycol (MIRALAX / GLYCOLAX) packet Take 17 g by mouth daily as needed for moderate constipation. 10/11/14  Yes Amy D Clegg, NP    sevelamer carbonate (RENVELA) 800 MG tablet Take 800 mg by mouth 3 (three) times daily with meals.   Yes Historical Provider, MD  sildenafil (REVATIO) 20 MG tablet Take 4 tablets (80 mg total) by mouth 3 (three) times  daily. 08/24/14  Yes Dayna N Dunn, PA-C  traZODone (DESYREL) 50 MG tablet Take 50 mg by mouth at bedtime.   Yes Historical Provider, MD  warfarin (COUMADIN) 5 MG tablet Take 5 mg by mouth daily.   Yes Historical Provider, MD  ALPRAZolam Duanne Moron) 1 MG tablet Take 1 tablet (1 mg total) by mouth 2 (two) times daily as needed for anxiety (be careful with extra sedation/somnolence). Patient not taking: Reported on 11/13/2014 10/11/14   Amy D Clegg, NP  bisacodyl (DULCOLAX) 10 MG suppository Place 1 suppository (10 mg total) rectally daily as needed for moderate constipation. 10/11/14   Amy D Clegg, NP  ondansetron (ZOFRAN) 4 MG tablet Take 1 tablet (4 mg total) by mouth every 8 (eight) hours as needed for nausea or vomiting. 10/11/14   Amy D Ninfa Meeker, NP  oxyCODONE-acetaminophen (PERCOCET) 10-325 MG per tablet Take 1 tablet by mouth every 4 (four) hours as needed for pain. Patient taking differently: Take 1-2 tablets by mouth every 4 (four) hours as needed for pain.  10/11/14   Amy D Clegg, NP  temazepam (RESTORIL) 7.5 MG capsule Take 1 capsule (7.5 mg total) by mouth at bedtime as needed for sleep. Patient not taking: Reported on 11/13/2014 10/11/14   Conrad [place], NP     Social History   Social History  . Marital Status: Married    Spouse Name: N/A  . Number of Children: 1  . Years of Education: N/A   Occupational History  . Disabled    Social History Main Topics  . Smoking status: Former Smoker    Types: Cigars    Quit date: 02/09/1983  . Smokeless tobacco: Never Used     Comment: 25 yrs ago  . Alcohol Use: No  . Drug Use: No  . Sexual Activity: No   Other Topics Concern  . Not on file   Social History Narrative   Lives with wife.      Family Status  Relation Status Death Age   . Mother Alive   . Father Deceased 54  . Sister Alive   . Brother Deceased   . Paternal Uncle Deceased   . Maternal Grandmother Deceased   . Maternal Grandfather Deceased   . Paternal Grandmother Deceased   . Paternal Grandfather Deceased    Family History  Problem Relation Age of Onset  . Lung cancer    . Cancer Mother     skin  . Dementia Mother   . Depression Mother   . Heart disease Father 79  . Hypertension Father   . Lung cancer Father   . Obesity Father   . Obesity Sister   . Hypertension Sister   . Diabetes Brother   . Hypertension Brother   . Heart disease Brother 46    Died of stroke MI age 40  . Obesity Brother   . Lung cancer Paternal Uncle   . Diabetes Paternal Uncle     requiring leg amputations      ROS:  Full 14 point review of systems complete and found to be negative unless listed above.  Physical Exam: Blood pressure 85/50, pulse 70, temperature 98.1 F (36.7 C), temperature source Oral, resp. rate 16, height 5\' 10"  (1.778 m), weight 211 lb 3.2 oz (95.8 kg), SpO2 100 %.  General: Well developed, well nourished, male in no acute distress Head: Eyes PERRLA, No xanthomas. Normocephalic and atraumatic, oropharynx without edema or exudate.  Lungs: Resp regular and unlabored, CTA. Right chest  tube in place.  Heart: Ir Irno s3, s4, or murmurs..   Neck: No carotid bruits. No lymphadenopathy.  No JVD. Abdomen: Bowel sounds present, abdomen soft and mildly distended Msk:  No spine or cva tenderness. No weakness, no joint deformities or effusions. Extremities: Unna boots in placed. DP/PT/Radials pulse 1+  and equal bilaterally. Neuro: Alert and oriented X 3. No focal deficits noted. Psych:  Good affect, responds appropriately Skin: No rashes or lesions noted.  Labs:   Lab Results  Component Value Date   WBC 6.2 11/15/2014   HGB 9.5* 11/15/2014   HCT 30.4* 11/15/2014   MCV 103.1* 11/15/2014   PLT 210 11/15/2014    Recent Labs  11/15/14 0409   INR 1.62*    Recent Labs Lab 11/15/14 0409  NA 127*  K 4.8  CL 91*  CO2 24  BUN 22*  CREATININE 3.78*  CALCIUM 8.2*  PROT 6.3*  BILITOT 1.0  ALKPHOS 94  ALT 175*  AST 167*  GLUCOSE 114*  ALBUMIN 2.5*     LIPASE  Date/Time Value Ref Range Status  11/14/2014 03:29 AM 20* 22 - 51 U/L Final   Echo: 02/03/15 LV EF: 20% -  25%  ------------------------------------------------------------------- Indications:   CHF - 428.0. Dyspnea 786.09.  ------------------------------------------------------------------- History:  PMH:  Atrial fibrillation. Congestive heart failure. Risk factors: Hypertension. Diabetes mellitus. Dyslipidemia.  ------------------------------------------------------------------- Study Conclusions  - Left ventricle: The cavity size was moderately to severely dilated. Systolic function was severely reduced. The estimated ejection fraction was in the range of 20% to 25%. Akinesis of the inferior myocardium. - Aorta: The aorta was mildly dilated. - Left atrium: The atrium was severely dilated.  ECG:   Vent. rate 81 BPM PR interval * ms QRS duration 163 ms QT/QTc 514/597 ms P-R-T axes -1 270 76  Radiology:  Ct Abdomen Pelvis Wo Contrast  11/14/2014   ADDENDUM REPORT: 11/14/2014 08:10 ADDENDUM: Study discussed by telephone with Dr. Ree Kida On 11/14/2014 at 0800 hours. Electronically Signed   By: Genevie Ann M.D.   On: 11/14/2014 08:10  11/14/2014   CLINICAL DATA:  64 year old male with constant right side abdominal pain. Retroperitoneal hemorrhage in August. Subsequent encounter.  EXAM: CT ABDOMEN AND PELVIS WITHOUT CONTRAST  TECHNIQUE: Multidetector CT imaging of the abdomen and pelvis was performed following the standard protocol without IV contrast.  COMPARISON:  09/15/2014 and earlier.  FINDINGS: Increased right pleural effusion, now almost completely opacifying the visible right lung base. On this noncontrast exam there are multiple round  and nodular intermediate density foci within the right pleural space, individually up to 4 cm (series 2, image 19), new since August. The right lower lobe is partially collapsed and its airways partially filled with fluid ( "Drowned lung" ). There are parenchymal calcifications suggesting granulomas.  Cardiomegaly with no pericardial effusion. No left pleural effusion and left lower lobe ventilation has improved.  There are displaced lateral right eighth and ninth rib fractures which are new since August (series 3). These are in proximity to the intermediate density material in the right pleural space. Lower thoracic and lumbar vertebrae appear intact. Bilateral SI joint ankylosis. Degenerative changes at the hips. Proximal left femur orthopedic hardware. No other acute osseous abnormality identified.  Bilateral flank anasarca has increased and now tracks to the pelvis. The large right retroperitoneal hemorrhage has decreased but not resolved, now encompassing 12 x 15.5 x 14 cm (AP by transverse by CC) versus 17 x 19 x 24 cm previously. Layering hematocrit again  noted. The right psoas muscle is less abnormal now. Regional mass effect including anterior displacement of the right kidney is stable to mildly regressed.  Interval regressed free fluid in the abdomen.  Stable presacral stranding. Stable distal colon with intermittent retained stool. There is no longer gas in the urinary bladder. Diverticulosis of the sigmoid colon with no definite active inflammation. Mild gaseous distension of the left colon, transverse colon and right colon. Oral contrast has not reached the distal small bowel. No dilated or abnormal small bowel loops. Fat containing umbilical hernia. Negative stomach and duodenum.  The gallbladder is mildly distended, no pericholecystic stranding. Stable noncontrast liver parenchyma, spleen, pancreas, adrenal glands, and kidneys. Aortoiliac calcified atherosclerosis noted. Stable maximal celiac axis  lymph nodes up to 14 mm.  IMPRESSION: 1. Large volume right hemothorax with new displaced right lateral eighth and ninth rib fractures. Complete atelectasis of the visible right lung. 2. Regressed but not resolved large right retroperitoneal hemorrhage since August. Residual hemorrhage volume approximates 1,302 mL. 3. No new abnormality in the abdomen or pelvis.  Electronically Signed: By: Genevie Ann M.D. On: 11/14/2014 07:52   Ct Head Wo Contrast  11/14/2014   CLINICAL DATA:  Altered mental status.  EXAM: CT HEAD WITHOUT CONTRAST  TECHNIQUE: Contiguous axial images were obtained from the base of the skull through the vertex without intravenous contrast.  COMPARISON:  08/25/2014, 02/01/2014  FINDINGS: There is no intracranial hemorrhage or extra-axial fluid collection. There is no evidence of acute infarction. There is encephalomalacia due to remote right posterior MCA infarction. There is mild generalized atrophy. There is mild periventricular hypodensity consistent with chronic small vessel disease. Prominent pituitary soft tissues are consistent with the previously described adenoma. Visible paranasal sinuses are clear.  IMPRESSION: 1. Remote right MCA infarction. 2. Unchanged pituitary adenoma. 3. Mild generalized atrophy and chronic small vessel disease. 4. No acute findings   Electronically Signed   By: Andreas Newport M.D.   On: 11/14/2014 00:53   Dg Chest Port 1 View  11/15/2014   CLINICAL DATA:  Traumatic hemopneumothorax  EXAM: PORTABLE CHEST 1 VIEW  COMPARISON:  November 14, 2014  FINDINGS: Chest tube is present on the right. There is a minimal right apical pneumothorax. There is persistent pleural effusion with patchy airspace consolidation in the mid and lower lung zones on the right. Left lung is clear except for mild left base atelectasis. Heart is enlarged with pulmonary vascularity within normal limits. Pacemaker leads are attached to the right atrium and right ventricle. Central catheter tip is  in the right atrium slightly beyond the cavoatrial junction. There is again noted a displaced right lateral eighth rib fracture.  IMPRESSION: Minimal right apical pneumothorax. Right effusion as well as airspace consolidation in the right mid and lower lung zones is present. Displaced right eighth rib fracture again noted. Left lung clear except for mild left base atelectasis. Stable cardiomegaly.   Electronically Signed   By: Lowella Grip III M.D.   On: 11/15/2014 08:00   Dg Chest Port 1 View  11/14/2014   CLINICAL DATA:  64 year old male right side chest tube placement for hemothorax. Initial encounter.  EXAM: PORTABLE CHEST 1 VIEW  COMPARISON:  CT Abdomen and Pelvis 0733 hours today, and earlier  FINDINGS: Portable AP view at 1611 hours. New right chest tube placed. Significantly reduced right pleural effusion/hemothorax volume. Improved right lung ventilation. No pneumothorax identified. Displaced right eighth rib fracture re- identified.  Stable cardiomegaly and mediastinal contours. Stable right IJ  approach dual lumen dialysis type catheter. Stable left chest cardiac pacemaker. Residual right lung base atelectasis.  IMPRESSION: Regressed right pleural effusion/ hemothorax and improved right lung ventilation status post right chest tube placement. No pneumothorax identified.   Electronically Signed   By: Genevie Ann M.D.   On: 11/14/2014 16:19   Dg Chest Port 1 View  11/13/2014   CLINICAL DATA:  Short of breath  EXAM: PORTABLE CHEST 1 VIEW  COMPARISON:  09/04/2014  FINDINGS: Tunneled right internal jugular dialysis catheter in place. Tip is at the cavoatrial junction. Right PICC is been removed. Left subclavian AICD device and leads are stable. Large right pleural effusion has developed. This obscures the right lower lung.  IMPRESSION: New large right pleural effusion.   Electronically Signed   By: Marybelle Killings M.D.   On: 11/13/2014 23:43    ASSESSMENT AND PLAN:     1. Parosysmal atrial  fibrillation - CHADSVASc score of at least 5 (CHF, HTN, CVA, DM). He was off coumadin when he had femur fracture in July/ Aug 2016. Restarted on coumadin 9/20 when seen in clinic by Dr. Aundra Dubin. He was in sinus rhythm. Likely had 3 falls at SNF afterwards. Now stopped due to R hemothorax and Large Right Retroperitoneal hemorrhage. - This very difficult situation. Continue to hold coumadin. - Continue currently dose of amiodarone.   2. Chronic systolic CHF - Nonischemic cardiomyopathy for years. EF of 20-25% s/p St. Jude  - Last cath 08/2014 showed elevated right and left heart filling pressures and severe pulmonary HTN. Milrinone was increased to 0.5 and Revatio was increased. - Last echo in 12/15 showed EF 20-25% with normal RV size. - Managed by HD - Continue midodrine.   3. ESRD - On HD - MWF and Saturday  4. CAD -  Nonobstructive on coronary angiography in 5/16. Continue statin.   5. 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 PVR 5.7 on RHC in 7/16. He is now on Revatio.   6. Essential hypertension, benign - Soft low BP - Continue midodrine  Otherwise per primary and [facility] team   Acute encephalopathy   DM2 (diabetes mellitus, type 2) ([place])   Depression with anxiety   OSA (obstructive sleep apnea), intolerant to CPAP/BIPAP   Antiphospholipid antibody positive   Encounter for palliative care   Abdominal pain   Foot lesion   Pleural effusion, right   Absolute anemia   Rib fractures   Hemothorax, right   Signed: Bhagat,Bhavinkumar, PA 11/15/2014, 2:11 PM Co-Sign MD   I have seen and examined the patient along with Bhagat,Bhavinkumar, PA.  I have reviewed the chart, notes and new data.  I agree with PA's note.  Key new complaints: pain well controlled Key examination changes: RRR; reduced breath sounds right base Key new findings /  data: interrogation of ICD shows normal device function and no episodes of atrial fibrillation since early April 2016 (some few episodes of brief AMS since then are all due to far field R wave oversensing).  PLAN:   Have to hold anticoagulation due to active bleeding. Fortunately, no episodes of atrial fibrillation in last 6 months. Unfortunately, he is at high risk of stroke/other embolic events due to atrial fibrillation and antiphospholipid sd.   He could be a good candidate for Watchman device once he is outside the acute bleeding episode, although this would not prevent antiphospholipid sd related events.  I  think he is a little "dry" (relatively), due to very poor PO intake over last few days. May not be able to remove as much fluid at HD as usual. Continue pulmonary vasodilators, which should have little impact on his systemic BP.  Sanda Klein, MD, [place] [phone] 11/15/2014, 3:23 PM

## 2014-11-15 NOTE — Clinical Social Work Note (Signed)
FL-2 completed and faxed to [place] for review.  CSW remains available as needed.   Glendon Axe, MSW, LCSWA [phone] 11/15/2014 3:10 PM

## 2014-11-15 NOTE — Progress Notes (Signed)
  OT Cancellation Note  Patient Details Name: Larry [name] MRN: [id number] DOB: [birth year]   Cancelled Treatment:    Reason Eval/Treat Not Completed: Patient at procedure or test/ unavailable (HD currenlty)  [name] B 11/15/2014, 7:57 AM    Jeri Modena   OTR/L Pager: [phone] Office: [phone] .

## 2014-11-15 NOTE — Telephone Encounter (Signed)
Updated family.

## 2014-11-15 NOTE — Progress Notes (Signed)
[name] Progress Note   Subjective: no complaints, much stronger appearing, ox 3 today. Had CT placed last night  Filed Vitals:   11/15/14 1000 11/15/14 1030 11/15/14 1100 11/15/14 1114  BP: 87/54 84/48 89/48  85/50  Pulse: 69 72 72 70  Temp:    98.1 F (36.7 C)  TempSrc:    Oral  Resp:    16  Height:      Weight:    95.8 kg (211 lb 3.2 oz)  SpO2:       Exam: Older adult male, alert, looks better Sclera anicteric, throat clear No jvd Chest clear bilat to the bases Irreg rhythm, 2/6 SEM no RG Abd is obese w large pannus, no groin wounds or drainage, +BS, no ascites  GU normla male LE's bilat erythema shins and feet, bilat dorsal small foot ulcers/ wounds about 1x1 cm Neuro more alert, Ox 3 today   MWFS [place] 4hrs 95kgs 4K/2.25 Ca Cath No heparin P4 micera 50 q 2 weeks- last given9/28   Assessment: 1. AMS- CT neg, much better today prob due to #2 and infection 2. Foot wounds/ LE erythema - possible cellulitis/ foot infection. On emp vanc/zosyn 3. Rib fractures/ hemothorax- trauma surgery following. Better after chest tube 4. ESRD - MWFS, HD today and tomorrow 5. Volume - up 4kg by wts 6. Anemia - on mircera at OP unit, hgb 8.9  7. Metabolic bone disease - cont renvela, last phos 6.7 and PTH 177 8. Nutrition - MVI 9. Chronic hypotension on midodrine 10. AFib on coumadin/ amiodarone  Plan - HD today and short HD on Sat    Larry Deshay Blumenfeld MD  pager [phone]    cell [phone]  11/15/2014, 12:26 PM     Recent Labs Lab 11/14/14 0006 11/14/14 0012 11/14/14 0329 11/15/14 0409  NA 130* 133* 130* 127*  K 4.7 4.6 4.5 4.8  CL 93* 96* 93* 91*  CO2 26  --  26 24  GLUCOSE 124* 126* 132* 114*  BUN 12 15 13  22*  CREATININE 2.67* 2.50* 2.75* 3.78*  CALCIUM 8.3*  --  8.1* 8.2*    Recent Labs Lab 11/14/14 0006 11/14/14 0329 11/15/14 0409  AST 329* 275* 167*  ALT 236* 214* 175*  ALKPHOS 119 108 94  BILITOT 0.8 0.9 1.0  PROT  7.3 6.4* 6.3*  ALBUMIN 2.9* 2.7* 2.5*    Recent Labs Lab 11/14/14 0006 11/14/14 0012 11/14/14 0500 11/15/14 0409  WBC 6.0  --  5.9 6.2  NEUTROABS 4.1  --   --   --   HGB 9.1* 11.2* 8.9* 9.5*  HCT 29.2* 33.0* 28.0* 30.4*  MCV 101.7*  --  101.8* 103.1*  PLT 228  --  204 210   . amiodarone  200 mg Oral Daily  . atorvastatin  40 mg Oral Daily  . bisacodyl  5-10 mg Oral QHS  . collagenase   Topical Daily  . DULoxetine  60 mg Oral BID  . insulin aspart  0-9 Units Subcutaneous TID WC  . insulin glargine  10 Units Subcutaneous QHS  . levothyroxine  75 mcg Oral QAC breakfast  . lidocaine-EPINEPHrine  30 mL Intradermal Once  . midodrine  10 mg Oral TID WC  . multivitamin  1 tablet Oral QHS  . piperacillin-tazobactam (ZOSYN)  IV  2.25 g Intravenous 3 times per day  . sevelamer carbonate  800 mg Oral TID WC  . sildenafil  80 mg Oral TID  . sodium chloride  3 mL  Intravenous Q12H  . traZODone  50 mg Oral QHS  . vancomycin  1,000 mg Intravenous Q M,W,F-HD     acetaminophen, oxyCODONE-acetaminophen **AND** oxyCODONE, polyethylene glycol

## 2014-11-16 ENCOUNTER — Inpatient Hospital Stay (HOSPITAL_COMMUNITY): Payer: Commercial Managed Care - HMO

## 2014-11-16 DIAGNOSIS — Z515 Encounter for palliative care: Secondary | ICD-10-CM

## 2014-11-16 LAB — RENAL FUNCTION PANEL
Albumin: 2.2 g/dL — ABNORMAL LOW (ref 3.5–5.0)
Anion gap: 8 (ref 5–15)
BUN: 16 mg/dL (ref 6–20)
CHLORIDE: 96 mmol/L — AB (ref 101–111)
CO2: 26 mmol/L (ref 22–32)
Calcium: 8 mg/dL — ABNORMAL LOW (ref 8.9–10.3)
Creatinine, Ser: 3.06 mg/dL — ABNORMAL HIGH (ref 0.61–1.24)
GFR calc Af Amer: 24 mL/min — ABNORMAL LOW (ref >60)
GFR, EST NON AFRICAN AMERICAN: 20 mL/min — AB (ref >60)
Glucose, Bld: 103 mg/dL — ABNORMAL HIGH (ref 65–99)
POTASSIUM: 3.7 mmol/L (ref 3.5–5.1)
Phosphorus: 4.4 mg/dL (ref 2.5–4.6)
Sodium: 130 mmol/L — ABNORMAL LOW (ref 135–145)

## 2014-11-16 LAB — BASIC METABOLIC PANEL
ANION GAP: 9 (ref 5–15)
BUN: 17 mg/dL (ref 6–20)
CALCIUM: 8 mg/dL — AB (ref 8.9–10.3)
CO2: 25 mmol/L (ref 22–32)
Chloride: 96 mmol/L — ABNORMAL LOW (ref 101–111)
Creatinine, Ser: 3.02 mg/dL — ABNORMAL HIGH (ref 0.61–1.24)
GFR calc Af Amer: 24 mL/min — ABNORMAL LOW (ref >60)
GFR, EST NON AFRICAN AMERICAN: 21 mL/min — AB (ref >60)
GLUCOSE: 101 mg/dL — AB (ref 65–99)
Potassium: 3.7 mmol/L (ref 3.5–5.1)
Sodium: 130 mmol/L — ABNORMAL LOW (ref 135–145)

## 2014-11-16 LAB — URINE CULTURE: CULTURE: NO GROWTH

## 2014-11-16 LAB — GLUCOSE, CAPILLARY
GLUCOSE-CAPILLARY: 83 mg/dL (ref 65–99)
GLUCOSE-CAPILLARY: 125 mg/dL — AB (ref 65–99)
Glucose-Capillary: 99 mg/dL (ref 65–99)
Glucose-Capillary: 86 mg/dL (ref 65–99)

## 2014-11-16 LAB — CBC
HEMATOCRIT: 29.9 % — AB (ref 39.0–52.0)
Hemoglobin: 9.4 g/dL — ABNORMAL LOW (ref 13.0–17.0)
MCH: 32.1 pg (ref 26.0–34.0)
MCHC: 31.4 g/dL (ref 30.0–36.0)
MCV: 102 fL — AB (ref 78.0–100.0)
PLATELETS: 154 10*3/uL (ref 150–400)
RBC: 2.93 MIL/uL — ABNORMAL LOW (ref 4.22–5.81)
RDW: 20 % — AB (ref 11.5–15.5)
WBC: 6.2 10*3/uL (ref 4.0–10.5)

## 2014-11-16 LAB — PROTIME-INR
INR: 1.41 (ref 0.00–1.49)
PROTHROMBIN TIME: 17.3 s — AB (ref 11.6–15.2)

## 2014-11-16 MED ORDER — LIDOCAINE HCL (PF) 1 % IJ SOLN: 5.0000 mL | INTRAMUSCULAR | Status: DC | PRN

## 2014-11-16 MED ORDER — PENTAFLUOROPROP-TETRAFLUOROETH EX AERO: 1.0000 "application " | INHALATION_SPRAY | CUTANEOUS | Status: DC | PRN

## 2014-11-16 MED ORDER — SODIUM CHLORIDE 0.9 % IV SOLN: 100.0000 mL | INTRAVENOUS | Status: DC | PRN

## 2014-11-16 MED ORDER — ALPRAZOLAM 0.5 MG PO TABS
ORAL_TABLET | ORAL | Status: AC
  Filled 2014-11-16: qty 1

## 2014-11-16 MED ORDER — ALTEPLASE 2 MG IJ SOLR: 2.0000 mg | Freq: Once | INTRAMUSCULAR | Status: DC | PRN

## 2014-11-16 MED ORDER — OXYCODONE-ACETAMINOPHEN 5-325 MG PO TABS
ORAL_TABLET | ORAL | Status: AC
  Filled 2014-11-16: qty 1

## 2014-11-16 MED ORDER — ALPRAZOLAM 0.5 MG PO TABS
0.5000 mg | ORAL_TABLET | Freq: Three times a day (TID) | ORAL | Status: DC | PRN
  Administered 2014-11-16 – 2014-11-22 (×5): 0.5 mg via ORAL
  Filled 2014-11-16 (×4): qty 1

## 2014-11-16 MED ORDER — ALTEPLASE 2 MG IJ SOLR
2.0000 mg | Freq: Once | INTRAMUSCULAR | Status: DC | PRN
  Filled 2014-11-16: qty 2

## 2014-11-16 MED ORDER — BISACODYL 10 MG RE SUPP
10.0000 mg | Freq: Once | RECTAL | Status: AC
  Administered 2014-11-16: 10 mg via RECTAL
  Filled 2014-11-16: qty 1

## 2014-11-16 MED ORDER — VANCOMYCIN HCL IN DEXTROSE 1-5 GM/200ML-% IV SOLN
1000.0000 mg | Freq: Once | INTRAVENOUS | Status: AC
  Administered 2014-11-16: 1000 mg via INTRAVENOUS
  Filled 2014-11-16: qty 200

## 2014-11-16 MED ORDER — HEPARIN SODIUM (PORCINE) 1000 UNIT/ML DIALYSIS
1000.0000 [IU] | INTRAMUSCULAR | Status: DC | PRN
  Filled 2014-11-16: qty 1

## 2014-11-16 MED ORDER — LIDOCAINE-PRILOCAINE 2.5-2.5 % EX CREA
1.0000 "application " | TOPICAL_CREAM | CUTANEOUS | Status: DC | PRN
  Filled 2014-11-16: qty 5

## 2014-11-16 MED ORDER — HEPARIN SODIUM (PORCINE) 1000 UNIT/ML DIALYSIS: 1000.0000 [IU] | INTRAMUSCULAR | Status: DC | PRN

## 2014-11-16 MED ORDER — LIDOCAINE-PRILOCAINE 2.5-2.5 % EX CREA: 1.0000 "application " | TOPICAL_CREAM | CUTANEOUS | Status: DC | PRN

## 2014-11-16 NOTE — Progress Notes (Signed)
Consult note reviewed - difficult situation. Will need to continue to hold anticoagulation until bleeding has resolved. He is at increased thrombotic risk. Restart anticoagulation as soon as practical. No further suggestions at this time. Call with questions.  Pixie Casino, MD, [facility] Attending Cardiologist [place]

## 2014-11-16 NOTE — Progress Notes (Signed)
CCMD reported a 5 beat run of  vtach by patient. Patient assessed and was asymptomatic. Dr Debara Pickett from Cardiology has been informed. Amiodarone given at 0940. Continue to monitor patient

## 2014-11-16 NOTE — Progress Notes (Signed)
ANTIBIOTIC CONSULT NOTE - FOLLOW UP  Pharmacy Consult for Zosyn and Vancomycin + Coumadin Indication: DFI + Afib  Allergies  Allergen Reactions  . Bydureon [Exenatide] Other (See Comments)    sweating  . Losartan Potassium Other (See Comments)    insomnia  . Gabapentin Other (See Comments)    confusion    Patient Measurements: Height: 5\' 10"  (177.8 cm) Weight: 209 lb 14.1 oz (95.2 kg) IBW/kg (Calculated) : 73  Vital Signs: Temp: 97.5 F (36.4 C) (10/08 1014) Temp Source: Oral (10/08 1014) BP: 88/52 mmHg (10/08 1014) Pulse Rate: 73 (10/08 1014) Intake/Output from previous day: 10/07 0701 - 10/08 0700 In: 283 [P.O.:180; I.V.:3; IV Piggyback:100] Out: 1900  Intake/Output from this shift: Total I/O In: -  Out: 1481 [Other:1481]  Labs:  Recent Labs  11/14/14 0006 11/14/14 0012  11/14/14 0500 11/15/14 0409 11/16/14 0815 11/16/14 0816  WBC 6.0  --   --  5.9 6.2  --   --   HGB 9.1* 11.2*  --  8.9* 9.5*  --   --   PLT 228  --   --  204 210  --   --   CREATININE 2.67* 2.50*  < >  --  3.78* 3.06* 3.02*  < > = values in this interval not displayed. Estimated Creatinine Clearance: 29.4 mL/min (by C-G formula based on Cr of 3.02). No results for input(s): VANCOTROUGH, VANCOPEAK, VANCORANDOM, GENTTROUGH, GENTPEAK, GENTRANDOM, TOBRATROUGH, TOBRAPEAK, TOBRARND, AMIKACINPEAK, AMIKACINTROU, AMIKACIN in the last 72 hours.   Microbiology: Recent Results (from the past 720 hour(s))  Culture, blood (routine x 2)     Status: None (Preliminary result)   Collection Time: 11/13/14 11:48 PM  Result Value Ref Range Status   Specimen Description BLOOD LEFT FOREARM  Final   Special Requests BOTTLES DRAWN AEROBIC ONLY 3CC  Final   Culture NO GROWTH 2 DAYS  Final   Report Status PENDING  Incomplete  Culture, blood (routine x 2)     Status: None (Preliminary result)   Collection Time: 11/14/14 12:05 AM  Result Value Ref Range Status   Specimen Description BLOOD LEFT ARM  Final   Special Requests BOTTLES DRAWN AEROBIC AND ANAEROBIC 5CC  Final   Culture NO GROWTH 2 DAYS  Final   Report Status PENDING  Incomplete  MRSA PCR Screening     Status: None   Collection Time: 11/14/14  3:05 AM  Result Value Ref Range Status   MRSA by PCR NEGATIVE NEGATIVE Final    Comment:        The GeneXpert MRSA Assay (FDA approved for NASAL specimens only), is one component of a comprehensive MRSA colonization surveillance program. It is not intended to diagnose MRSA infection nor to guide or monitor treatment for MRSA infections.   Urine culture     Status: None   Collection Time: 11/15/14  1:16 AM  Result Value Ref Range Status   Specimen Description URINE, RANDOM  Final   Special Requests NONE  Final   Culture NO GROWTH 1 DAY  Final   Report Status 11/16/2014 FINAL  Final    Anti-infectives    Start     Dose/Rate Route Frequency Ordered Stop   11/16/14 1200  vancomycin (VANCOCIN) IVPB 1000 mg/200 mL premix     1,000 mg 200 mL/hr over 60 Minutes Intravenous  Once 11/16/14 1111     11/15/14 1400  piperacillin-tazobactam (ZOSYN) IVPB 2.25 g     2.25 g 100 mL/hr over 30 Minutes Intravenous 3  times per day 11/15/14 0755     11/15/14 1200  vancomycin (VANCOCIN) IVPB 1000 mg/200 mL premix     1,000 mg 200 mL/hr over 60 Minutes Intravenous Every M-W-F (Hemodialysis) 11/15/14 0754     11/15/14 0200  vancomycin (VANCOCIN) IVPB 1000 mg/200 mL premix  Status:  Discontinued     1,000 mg 200 mL/hr over 60 Minutes Intravenous Every 24 hours 11/14/14 0252 11/15/14 0753   11/14/14 0800  piperacillin-tazobactam (ZOSYN) IVPB 3.375 g  Status:  Discontinued     3.375 g 12.5 mL/hr over 240 Minutes Intravenous 3 times per day 11/14/14 0240 11/15/14 0755   11/14/14 0600  piperacillin-tazobactam (ZOSYN) IVPB 3.375 g  Status:  Discontinued     3.375 g 100 mL/hr over 30 Minutes Intravenous 3 times per day 11/14/14 0233 11/14/14 0239   11/14/14 0130  vancomycin (VANCOCIN) IVPB 1000 mg/200 mL  premix     1,000 mg 200 mL/hr over 60 Minutes Intravenous  Once 11/14/14 0126 11/14/14 0259   11/14/14 0115  piperacillin-tazobactam (ZOSYN) IVPB 3.375 g     3.375 g 100 mL/hr over 30 Minutes Intravenous  Once 11/14/14 0109 11/14/14 0159      Assessment: 62 yom presented to the hospital with AMS. To start vancomycin for possible diabetic foot infection. Now day #3 of abx for diabetic foot infection. Afebrile, WBC wnl. ESRD-MWF - on schedule. Last HD off schedule today on 10/8 with BFR of 400 x 3.5 hrs.   Also on warfarin for afib PTA. INR on admit was therapeutic at 2.94. PTA Coumadin is 5mg  daily. Found to have R rib fractures with hemothorax. Also with recent RP hemorrhage in August (warf held at this time). Coumadin stopped for now - INR down to 1.41 today  Goal of Therapy:  Vancomycin trough level 10-15 mcg/ml  Resolution of infection  INR 2.0-3.0  Plan:  Coumadin held for now due to bleeding Follow up plans to resume and need for bridging  Will give extra vancomycin 1g IV QHD today since he received session off schedule Continue Vancomycin 1g IV QHD-MWF Continue Zosyn to 2.25g IV 8h Monitor clinical picture, renal function, VT prn (consider early next week) F/U C&S, abx deescalation / LOT  Johnedward Brodrick J 11/16/2014,11:12 AM

## 2014-11-16 NOTE — Progress Notes (Signed)
[facility]                                                                              Patient Demographics  Larry [name], is a 64 y.o. male, DOB - [birth year], LNL:[id number]  Admit date - 11/13/2014   Admitting Physician Ivor Costa, MD  Outpatient Primary MD for the patient is [name], MD  LOS - 2   Chief Complaint  Patient presents with  . Altered Mental Status      HPI on 11/14/2014 by Dr. Felisa Bonier is a 64 y.o. 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  The patient's wife, patient has been mildly confused in the past 7 days. He has mild cough and shortness of breath, but no chest pain, fever or chills. Patient complains of abdominal pain. It is located in the right abdomen, constant, mild, radiating to the right shoulder, which has been going on for few weeks after he had a fall. He is constipated, no nausea, vomiting, diarrhea. He does not have symptoms of UTI. Patient done does not seem to have unilateral weakness, vision changes, hearing loss.  In ED, patient was found to have WBC 6.0, temperature normal, no tachycardia, creatinine 2.50, BUN 15, potassium 4.6, lactate of 1.62, INR 2.94. Chest x-ray showed new large right pleural effusion. CT-head showed remote right MCA infarction, unchanged pituitary adenoma, mild generalized atrophy and chronic small vessel disease, no acute findings.   Assessment & Plan   Right Rib Fractures with hemothorax -Secondary to falls -Seen on CT abdomen -Trauma surgery consulted and appreciated, chest tube inserted -Coumadin discontiued  Acute encephalopathy -Likely  secondary to the above -CT head: no acute findings -Currently afebrile, no leukocytosis, however patient started on vanc/zosyn -Xanax and Temazepam held -PT recommended SNF  Essential hypertension -Blood pressure is soft on admission. -continue midodrine  Macrocytic Anemia -Hb 9.5,drop likely secondary to hemothorax/hemorrhage -Pending CBC this morning -Baseline Hb 11 -Continue to monitor CBC  Diabetes mellitus, type 2 -Last A1c 7.7 on 06/26/14 -Continue lantus, ISS, CBG monitoring  Depression with anxiety: -hold Xanax due to AMS -continue cymbalta  Atrial Fibrillation -CHADSVASC 5 -Currently rate controlled -Was on coumadin, however, discontinued due to hemothorax and continued right retroperitoneal hemorrhage. -INR 1.62 (Vitamin K given on 11/14/2014) -Continue amiodarone -Discussed discontinuation of coumadin with wife.   -Cardiology consulted and appreciated, agreed with holding coumadin until bleeding stops  Antiphospholipid antibody -coumadin discontinued, discussed risks and benefits with the wife, she would like input from cardiology -Per Wife, patient has not had PE or DVT  Chronic systolic CHF (congestive heart failure) -Echocardiogram on 02/02/14 showed EF 20-25%.  -Currently compensated -Managed by dialysis  ESRD (end stage renal disease) on dialysis (MWFS) -Nephrology consulted and appreciated  Abdominal pain/ Large Right Retroperitoneal hemorrhage/ Elevated transaminases -CT abdomen/pelvis: no new abnormality  -Lipase 20 -AST/ALT improving- continue to monitor  Dyspnea/Cough -Secondary to above, continue treatment plan  as above  Multiple wounds -Stage 3 left heel pressure ulcer, left dorsal wound, right dorsal wound -Wound care consulted  Code Status: DNR  Family Communication: None at bedside (seen in dialysis)  Disposition Plan: Admitted.   Time Spent in minutes   30 minutes  Procedures  Chest tube inserted  Consults   Trauma  surgery Nephrology Cardiology  DVT Prophylaxis  SCDs  Lab Results  Component Value Date   PLT 210 11/15/2014    Medications  Scheduled Meds: . amiodarone  200 mg Oral Daily  . atorvastatin  40 mg Oral Daily  . bisacodyl  5-10 mg Oral QHS  . collagenase   Topical Daily  . DULoxetine  60 mg Oral BID  . insulin aspart  0-9 Units Subcutaneous TID WC  . insulin glargine  10 Units Subcutaneous QHS  . levothyroxine  75 mcg Oral QAC breakfast  . lidocaine-EPINEPHrine  30 mL Intradermal Once  . midodrine  10 mg Oral TID WC  . multivitamin  1 tablet Oral QHS  . oxyCODONE-acetaminophen      . piperacillin-tazobactam (ZOSYN)  IV  2.25 g Intravenous 3 times per day  . sevelamer carbonate  800 mg Oral TID WC  . sildenafil  80 mg Oral TID  . sodium chloride  3 mL Intravenous Q12H  . traZODone  50 mg Oral QHS  . vancomycin  1,000 mg Intravenous Q M,W,F-HD   Continuous Infusions:   PRN Meds:.sodium chloride, sodium chloride, acetaminophen, ALPRAZolam, alteplase, heparin, lidocaine (PF), lidocaine-prilocaine, oxyCODONE-acetaminophen **AND** oxyCODONE, pentafluoroprop-tetrafluoroeth, polyethylene glycol  Antibiotics    Anti-infectives    Start     Dose/Rate Route Frequency Ordered Stop   11/15/14 1400  piperacillin-tazobactam (ZOSYN) IVPB 2.25 g     2.25 g 100 mL/hr over 30 Minutes Intravenous 3 times per day 11/15/14 0755     11/15/14 1200  vancomycin (VANCOCIN) IVPB 1000 mg/200 mL premix     1,000 mg 200 mL/hr over 60 Minutes Intravenous Every M-W-F (Hemodialysis) 11/15/14 0754     11/15/14 0200  vancomycin (VANCOCIN) IVPB 1000 mg/200 mL premix  Status:  Discontinued     1,000 mg 200 mL/hr over 60 Minutes Intravenous Every 24 hours 11/14/14 0252 11/15/14 0753   11/14/14 0800  piperacillin-tazobactam (ZOSYN) IVPB 3.375 g  Status:  Discontinued     3.375 g 12.5 mL/hr over 240 Minutes Intravenous 3 times per day 11/14/14 0240 11/15/14 0755   11/14/14 0600  piperacillin-tazobactam  (ZOSYN) IVPB 3.375 g  Status:  Discontinued     3.375 g 100 mL/hr over 30 Minutes Intravenous 3 times per day 11/14/14 0233 11/14/14 0239   11/14/14 0130  vancomycin (VANCOCIN) IVPB 1000 mg/200 mL premix     1,000 mg 200 mL/hr over 60 Minutes Intravenous  Once 11/14/14 0126 11/14/14 0259   11/14/14 0115  piperacillin-tazobactam (ZOSYN) IVPB 3.375 g     3.375 g 100 mL/hr over 30 Minutes Intravenous  Once 11/14/14 0109 11/14/14 0159      Subjective:   Larry [name] seen and examined today in dialysis.  Feels breathing has improved.  Patient denies chest pain, abdominal pain, N/V/D/C.  Wants to know when he can go home.  Objective:   Filed Vitals:   11/16/14 0900 11/16/14 0930 11/16/14 1000 11/16/14 1014  BP: 100/57 97/52 80/56  88/52  Pulse: 80 76 79 73  Temp:    97.5 F (36.4 C)  TempSrc:    Oral  Resp: 15 16 13 14   Height:  Weight:    95.2 kg (209 lb 14.1 oz)  SpO2:        Wt Readings from Last 3 Encounters:  11/16/14 95.2 kg (209 lb 14.1 oz)  10/11/14 97.9 kg (215 lb 13.3 oz)  08/24/14 102.4 kg (225 lb 12 oz)     Intake/Output Summary (Last 24 hours) at 11/16/14 1046 Last data filed at 11/16/14 1014  Gross per 24 hour  Intake    283 ml  Output   3381 ml  Net  -3098 ml    Exam  General: Well developed, well nourished, NAD  HEENT: NCAT, mucous membranes moist.   Cardiovascular: S1 S2 auscultated, no murmurs  Respiratory: Clear to ausculation (R chest tube in place)  Abdomen: Soft, nontender, nondistended, + bowel sounds  Extremities: warm dry without cyanosis clubbing  Neuro: AAOx2, nonfocal  Data Review   Micro Results Recent Results (from the past 240 hour(s))  Culture, blood (routine x 2)     Status: None (Preliminary result)   Collection Time: 11/13/14 11:48 PM  Result Value Ref Range Status   Specimen Description BLOOD LEFT FOREARM  Final   Special Requests BOTTLES DRAWN AEROBIC ONLY 3CC  Final   Culture NO GROWTH 2 DAYS  Final   Report  Status PENDING  Incomplete  Culture, blood (routine x 2)     Status: None (Preliminary result)   Collection Time: 11/14/14 12:05 AM  Result Value Ref Range Status   Specimen Description BLOOD LEFT ARM  Final   Special Requests BOTTLES DRAWN AEROBIC AND ANAEROBIC 5CC  Final   Culture NO GROWTH 2 DAYS  Final   Report Status PENDING  Incomplete  MRSA PCR Screening     Status: None   Collection Time: 11/14/14  3:05 AM  Result Value Ref Range Status   MRSA by PCR NEGATIVE NEGATIVE Final    Comment:        The GeneXpert MRSA Assay (FDA approved for NASAL specimens only), is one component of a comprehensive MRSA colonization surveillance program. It is not intended to diagnose MRSA infection nor to guide or monitor treatment for MRSA infections.   Urine culture     Status: None   Collection Time: 11/15/14  1:16 AM  Result Value Ref Range Status   Specimen Description URINE, RANDOM  Final   Special Requests NONE  Final   Culture NO GROWTH 1 DAY  Final   Report Status 11/16/2014 FINAL  Final    Radiology Reports Ct Abdomen Pelvis Wo Contrast  11/14/2014   ADDENDUM REPORT: 11/14/2014 08:10 ADDENDUM: Study discussed by telephone with Dr. Ree Kida On 11/14/2014 at 0800 hours. Electronically Signed   By: Genevie Ann M.D.   On: 11/14/2014 08:10  11/14/2014   CLINICAL DATA:  64 year old male with constant right side abdominal pain. Retroperitoneal hemorrhage in August. Subsequent encounter.  EXAM: CT ABDOMEN AND PELVIS WITHOUT CONTRAST  TECHNIQUE: Multidetector CT imaging of the abdomen and pelvis was performed following the standard protocol without IV contrast.  COMPARISON:  09/15/2014 and earlier.  FINDINGS: Increased right pleural effusion, now almost completely opacifying the visible right lung base. On this noncontrast exam there are multiple round and nodular intermediate density foci within the right pleural space, individually up to 4 cm (series 2, image 19), new since August. The right lower  lobe is partially collapsed and its airways partially filled with fluid ( "Drowned lung" ). There are parenchymal calcifications suggesting granulomas.  Cardiomegaly with no pericardial effusion. No left pleural  effusion and left lower lobe ventilation has improved.  There are displaced lateral right eighth and ninth rib fractures which are new since August (series 3). These are in proximity to the intermediate density material in the right pleural space. Lower thoracic and lumbar vertebrae appear intact. Bilateral SI joint ankylosis. Degenerative changes at the hips. Proximal left femur orthopedic hardware. No other acute osseous abnormality identified.  Bilateral flank anasarca has increased and now tracks to the pelvis. The large right retroperitoneal hemorrhage has decreased but not resolved, now encompassing 12 x 15.5 x 14 cm (AP by transverse by CC) versus 17 x 19 x 24 cm previously. Layering hematocrit again noted. The right psoas muscle is less abnormal now. Regional mass effect including anterior displacement of the right kidney is stable to mildly regressed.  Interval regressed free fluid in the abdomen.  Stable presacral stranding. Stable distal colon with intermittent retained stool. There is no longer gas in the urinary bladder. Diverticulosis of the sigmoid colon with no definite active inflammation. Mild gaseous distension of the left colon, transverse colon and right colon. Oral contrast has not reached the distal small bowel. No dilated or abnormal small bowel loops. Fat containing umbilical hernia. Negative stomach and duodenum.  The gallbladder is mildly distended, no pericholecystic stranding. Stable noncontrast liver parenchyma, spleen, pancreas, adrenal glands, and kidneys. Aortoiliac calcified atherosclerosis noted. Stable maximal celiac axis lymph nodes up to 14 mm.  IMPRESSION: 1. Large volume right hemothorax with new displaced right lateral eighth and ninth rib fractures. Complete  atelectasis of the visible right lung. 2. Regressed but not resolved large right retroperitoneal hemorrhage since August. Residual hemorrhage volume approximates 1,302 mL. 3. No new abnormality in the abdomen or pelvis.  Electronically Signed: By: Genevie Ann M.D. On: 11/14/2014 07:52   Ct Head Wo Contrast  11/14/2014   CLINICAL DATA:  Altered mental status.  EXAM: CT HEAD WITHOUT CONTRAST  TECHNIQUE: Contiguous axial images were obtained from the base of the skull through the vertex without intravenous contrast.  COMPARISON:  08/25/2014, 02/01/2014  FINDINGS: There is no intracranial hemorrhage or extra-axial fluid collection. There is no evidence of acute infarction. There is encephalomalacia due to remote right posterior MCA infarction. There is mild generalized atrophy. There is mild periventricular hypodensity consistent with chronic small vessel disease. Prominent pituitary soft tissues are consistent with the previously described adenoma. Visible paranasal sinuses are clear.  IMPRESSION: 1. Remote right MCA infarction. 2. Unchanged pituitary adenoma. 3. Mild generalized atrophy and chronic small vessel disease. 4. No acute findings   Electronically Signed   By: Andreas Newport M.D.   On: 11/14/2014 00:53   Dg Chest Port 1 View  11/15/2014   CLINICAL DATA:  Traumatic hemopneumothorax  EXAM: PORTABLE CHEST 1 VIEW  COMPARISON:  November 14, 2014  FINDINGS: Chest tube is present on the right. There is a minimal right apical pneumothorax. There is persistent pleural effusion with patchy airspace consolidation in the mid and lower lung zones on the right. Left lung is clear except for mild left base atelectasis. Heart is enlarged with pulmonary vascularity within normal limits. Pacemaker leads are attached to the right atrium and right ventricle. Central catheter tip is in the right atrium slightly beyond the cavoatrial junction. There is again noted a displaced right lateral eighth rib fracture.  IMPRESSION:  Minimal right apical pneumothorax. Right effusion as well as airspace consolidation in the right mid and lower lung zones is present. Displaced right eighth rib fracture again noted.  Left lung clear except for mild left base atelectasis. Stable cardiomegaly.   Electronically Signed   By: Lowella Grip III M.D.   On: 11/15/2014 08:00   Dg Chest Port 1 View  11/14/2014   CLINICAL DATA:  64 year old male right side chest tube placement for hemothorax. Initial encounter.  EXAM: PORTABLE CHEST 1 VIEW  COMPARISON:  CT Abdomen and Pelvis 0733 hours today, and earlier  FINDINGS: Portable AP view at 1611 hours. New right chest tube placed. Significantly reduced right pleural effusion/hemothorax volume. Improved right lung ventilation. No pneumothorax identified. Displaced right eighth rib fracture re- identified.  Stable cardiomegaly and mediastinal contours. Stable right IJ approach dual lumen dialysis type catheter. Stable left chest cardiac pacemaker. Residual right lung base atelectasis.  IMPRESSION: Regressed right pleural effusion/ hemothorax and improved right lung ventilation status post right chest tube placement. No pneumothorax identified.   Electronically Signed   By: Genevie Ann M.D.   On: 11/14/2014 16:19   Dg Chest Port 1 View  11/13/2014   CLINICAL DATA:  Short of breath  EXAM: PORTABLE CHEST 1 VIEW  COMPARISON:  09/04/2014  FINDINGS: Tunneled right internal jugular dialysis catheter in place. Tip is at the cavoatrial junction. Right PICC is been removed. Left subclavian AICD device and leads are stable. Large right pleural effusion has developed. This obscures the right lower lung.  IMPRESSION: New large right pleural effusion.   Electronically Signed   By: Marybelle Killings M.D.   On: 11/13/2014 23:43    CBC  Recent Labs Lab 11/14/14 0006 11/14/14 0012 11/14/14 0500 11/15/14 0409  WBC 6.0  --  5.9 6.2  HGB 9.1* 11.2* 8.9* 9.5*  HCT 29.2* 33.0* 28.0* 30.4*  PLT 228  --  204 210  MCV 101.7*   --  101.8* 103.1*  MCH 31.7  --  32.4 32.2  MCHC 31.2  --  31.8 31.3  RDW 19.5*  --  20.0* 20.1*  LYMPHSABS 0.7  --   --   --   MONOABS 0.9  --   --   --   EOSABS 0.2  --   --   --   BASOSABS 0.1  --   --   --     Chemistries   Recent Labs Lab 11/14/14 0006 11/14/14 0012 11/14/14 0329 11/15/14 0409 11/16/14 0815 11/16/14 0816  NA 130* 133* 130* 127* 130* 130*  K 4.7 4.6 4.5 4.8 3.7 3.7  CL 93* 96* 93* 91* 96* 96*  CO2 26  --  26 24 26 25   GLUCOSE 124* 126* 132* 114* 103* 101*  BUN 12 15 13  22* 16 17  CREATININE 2.67* 2.50* 2.75* 3.78* 3.06* 3.02*  CALCIUM 8.3*  --  8.1* 8.2* 8.0* 8.0*  AST 329*  --  275* 167*  --   --   ALT 236*  --  214* 175*  --   --   ALKPHOS 119  --  108 94  --   --   BILITOT 0.8  --  0.9 1.0  --   --    ------------------------------------------------------------------------------------------------------------------ estimated creatinine clearance is 29.4 mL/min (by C-G formula based on Cr of 3.02). ------------------------------------------------------------------------------------------------------------------ No results for input(s): HGBA1C in the last 72 hours. ------------------------------------------------------------------------------------------------------------------ No results for input(s): CHOL, HDL, LDLCALC, TRIG, CHOLHDL, LDLDIRECT in the last 72 hours. ------------------------------------------------------------------------------------------------------------------ No results for input(s): TSH, T4TOTAL, T3FREE, THYROIDAB in the last 72 hours.  Invalid input(s): FREET3 ------------------------------------------------------------------------------------------------------------------ No results for input(s): VITAMINB12, FOLATE, FERRITIN, TIBC, IRON, RETICCTPCT  in the last 72 hours.  Coagulation profile  Recent Labs Lab 11/14/14 0006 11/14/14 1316 11/15/14 0409 11/16/14 0355  INR 2.94* 2.47* 1.62* 1.41    No results for  input(s): DDIMER in the last 72 hours.  Cardiac Enzymes  Recent Labs Lab 11/14/14 0006  TROPONINI 0.06*   ------------------------------------------------------------------------------------------------------------------ Invalid input(s): POCBNP    Teria Khachatryan D.O. on 11/16/2014 at 10:46 AM  Between 7am to 7pm - Pager - [phone]  After 7pm go to [email or web address] - password [email or web address]  And look for the night coverage person covering for me after hours  [facility] Office  [phone]

## 2014-11-16 NOTE — Progress Notes (Signed)
Orthopedic Tech Progress Note Patient Details:  Larry [name] [birth year] [id number]  Ortho Devices Type of Ortho Device: Louretta Parma boot Ortho Device/Splint Location: bilateral unna boots Ortho Device/Splint Interventions: Application   [place] Pink 11/16/2014, 7:50 PM

## 2014-11-16 NOTE — Progress Notes (Signed)
Subjective: Just back from HD, no air leak 350 from CT yesterday, it looks mostly serosanguinous.  He says his breathing is better.  Objective: Vital signs in last 24 hours: Temp:  [96.9 F (36.1 C)-98.1 F (36.7 C)] 97.5 F (36.4 C) (10/08 1014) Pulse Rate:  [70-80] 73 (10/08 1014) Resp:  [13-20] 14 (10/08 1014) BP: (80-100)/(45-61) 88/52 mmHg (10/08 1014) SpO2:  [100 %] 100 % (10/08 0730) Weight:  [95.2 kg (209 lb 14.1 oz)-99.519 kg (219 lb 6.4 oz)] 95.2 kg (209 lb 14.1 oz) (10/08 1014) Last BM Date: 11/12/14 PO 180 recorded On dialysis Afebrile, BP down on HD NP 130, creatinine 3.02 No film today Film yesterday:  Minimal right apical pneumothorax. Right effusion as well as airspace consolidation in the right mid and lower lung zones is present. Displaced right eighth rib fracture again noted. Left lung clear except for mild left base atelectasis. Stable cardiomegaly.  Intake/Output from previous day: 10/07 0701 - 10/08 0700 In: 283 [P.O.:180; I.V.:3; IV Piggyback:100] Out: 1900  Intake/Output this shift: Total I/O In: -  Out: 1481 [Other:1481]  General appearance: alert, cooperative and no distress Resp: Good BS on right, CT site OK Chest wall: sore on right  Lab Results:   Recent Labs  11/14/14 0500 11/15/14 0409  WBC 5.9 6.2  HGB 8.9* 9.5*  HCT 28.0* 30.4*  PLT 204 210    BMET  Recent Labs  11/16/14 0815 11/16/14 0816  NA 130* 130*  K 3.7 3.7  CL 96* 96*  CO2 26 25  GLUCOSE 103* 101*  BUN 16 17  CREATININE 3.06* 3.02*  CALCIUM 8.0* 8.0*   PT/INR  Recent Labs  11/15/14 0409 11/16/14 0355  LABPROT 19.3* 17.3*  INR 1.62* 1.41     Recent Labs Lab 11/14/14 0006 11/14/14 0329 11/15/14 0409 11/16/14 0815  AST 329* 275* 167*  --   ALT 236* 214* 175*  --   ALKPHOS 119 108 94  --   BILITOT 0.8 0.9 1.0  --   PROT 7.3 6.4* 6.3*  --   ALBUMIN 2.9* 2.7* 2.5* 2.2*     Lipase     Component Value Date/Time   LIPASE 20* 11/14/2014 0329      Studies/Results: Dg Chest Port 1 View  11/15/2014   CLINICAL DATA:  Traumatic hemopneumothorax  EXAM: PORTABLE CHEST 1 VIEW  COMPARISON:  November 14, 2014  FINDINGS: Chest tube is present on the right. There is a minimal right apical pneumothorax. There is persistent pleural effusion with patchy airspace consolidation in the mid and lower lung zones on the right. Left lung is clear except for mild left base atelectasis. Heart is enlarged with pulmonary vascularity within normal limits. Pacemaker leads are attached to the right atrium and right ventricle. Central catheter tip is in the right atrium slightly beyond the cavoatrial junction. There is again noted a displaced right lateral eighth rib fracture.  IMPRESSION: Minimal right apical pneumothorax. Right effusion as well as airspace consolidation in the right mid and lower lung zones is present. Displaced right eighth rib fracture again noted. Left lung clear except for mild left base atelectasis. Stable cardiomegaly.   Electronically Signed   By: Lowella Grip III M.D.   On: 11/15/2014 08:00   Dg Chest Port 1 View  11/14/2014   CLINICAL DATA:  64 year old male right side chest tube placement for hemothorax. Initial encounter.  EXAM: PORTABLE CHEST 1 VIEW  COMPARISON:  CT Abdomen and Pelvis 0733 hours today, and earlier  FINDINGS: Portable AP view at 1611 hours. New right chest tube placed. Significantly reduced right pleural effusion/hemothorax volume. Improved right lung ventilation. No pneumothorax identified. Displaced right eighth rib fracture re- identified.  Stable cardiomegaly and mediastinal contours. Stable right IJ approach dual lumen dialysis type catheter. Stable left chest cardiac pacemaker. Residual right lung base atelectasis.  IMPRESSION: Regressed right pleural effusion/ hemothorax and improved right lung ventilation status post right chest tube placement. No pneumothorax identified.   Electronically Signed   By: Genevie Ann M.D.    On: 11/14/2014 16:19    Medications: . amiodarone  200 mg Oral Daily  . atorvastatin  40 mg Oral Daily  . bisacodyl  5-10 mg Oral QHS  . collagenase   Topical Daily  . DULoxetine  60 mg Oral BID  . insulin aspart  0-9 Units Subcutaneous TID WC  . insulin glargine  10 Units Subcutaneous QHS  . levothyroxine  75 mcg Oral QAC breakfast  . lidocaine-EPINEPHrine  30 mL Intradermal Once  . midodrine  10 mg Oral TID WC  . multivitamin  1 tablet Oral QHS  . oxyCODONE-acetaminophen      . piperacillin-tazobactam (ZOSYN)  IV  2.25 g Intravenous 3 times per day  . sevelamer carbonate  800 mg Oral TID WC  . sildenafil  80 mg Oral TID  . sodium chloride  3 mL Intravenous Q12H  . traZODone  50 mg Oral QHS  . vancomycin  1,000 mg Intravenous Q M,W,F-HD    Assessment/Plan Fall Right rib fxs w/HPTX -- CT to 20cm Multiple medical problems -- per primary service    Plan:  I dropped his suction to 20 CM, will get film in AM.  Film ordered for this Am still not done but pending.   LOS: 2 days    Kahmari Herard 11/16/2014

## 2014-11-16 NOTE — Progress Notes (Signed)
[facility] Progress Note   Subjective: no complaints, 1.5kg off w HD . BP's low on HD  Filed Vitals:   11/16/14 0900 11/16/14 0930 11/16/14 1000 11/16/14 1014  BP: 100/57 97/52 80/56  88/52  Pulse: 80 76 79 73  Temp:    97.5 F (36.4 C)  TempSrc:    Oral  Resp: 15 16 13 14   Height:      Weight:    95.2 kg (209 lb 14.1 oz)  SpO2:       Exam: Older adult male, in chair, tired and sleepy, no distress Sclera anicteric, throat clear No jvd Chest clear at both bases Irreg rhythm, 2/6 SEM no RG Abd is obese w large pannus, no groin wounds or drainage, +BS, no ascites  GU normla male Bilat 1-2+ pitting edema pretib and dependent hip area Neuro more alert, Ox 3 today   MWFS [place] 4hrs 95kgs 4K/2.25 Ca Cath No heparin P4 micera 50 q 2 weeks- last given9/28   Assessment: 1. AMS- back to baseline 2. Rib fractures/ hemothorax- trauma surgery following. Better after chest tube 3. ESRD - MWFS, HD today 4. Volume - chronic R sided edema (hx severe pulm HTN on sildenafil), at dry wt after HD today. Has LE edema, may need lower dry weight, get vol down some more if BP will tolerate. Is on midodrine, and not on any BP lowering agents.  5. Anemia - on mircera at OP unit, hgb 8.9  6. Metabolic bone disease - cont renvela, last phos 6.7 and PTH 177 7. Nutrition - MVI 8. Chronic hypotension on midodrine 9. AFib on amiodarone, coumadin stopped due to bleeding events (recent RP bleed, then hemothorax) and hx of falls 10. DNR  Plan - HD today, cont midodrine, cont to lower vol as tolerated.    Kelly Splinter MD  pager [phone]    cell [phone]  11/16/2014, 11:35 AM     Recent Labs Lab 11/15/14 0409 11/16/14 0815 11/16/14 0816  NA 127* 130* 130*  K 4.8 3.7 3.7  CL 91* 96* 96*  CO2 24 26 25   GLUCOSE 114* 103* 101*  BUN 22* 16 17  CREATININE 3.78* 3.06* 3.02*  CALCIUM 8.2* 8.0* 8.0*  PHOS  --  4.4  --     Recent Labs Lab 11/14/14 0006  11/14/14 0329 11/15/14 0409 11/16/14 0815  AST 329* 275* 167*  --   ALT 236* 214* 175*  --   ALKPHOS 119 108 94  --   BILITOT 0.8 0.9 1.0  --   PROT 7.3 6.4* 6.3*  --   ALBUMIN 2.9* 2.7* 2.5* 2.2*    Recent Labs Lab 11/14/14 0006 11/14/14 0012 11/14/14 0500 11/15/14 0409  WBC 6.0  --  5.9 6.2  NEUTROABS 4.1  --   --   --   HGB 9.1* 11.2* 8.9* 9.5*  HCT 29.2* 33.0* 28.0* 30.4*  MCV 101.7*  --  101.8* 103.1*  PLT 228  --  204 210   . amiodarone  200 mg Oral Daily  . atorvastatin  40 mg Oral Daily  . bisacodyl  5-10 mg Oral QHS  . collagenase   Topical Daily  . DULoxetine  60 mg Oral BID  . insulin aspart  0-9 Units Subcutaneous TID WC  . insulin glargine  10 Units Subcutaneous QHS  . levothyroxine  75 mcg Oral QAC breakfast  . lidocaine-EPINEPHrine  30 mL Intradermal Once  . midodrine  10 mg Oral TID WC  . multivitamin  1 tablet Oral QHS  . piperacillin-tazobactam (ZOSYN)  IV  2.25 g Intravenous 3 times per day  . sevelamer carbonate  800 mg Oral TID WC  . sildenafil  80 mg Oral TID  . sodium chloride  3 mL Intravenous Q12H  . traZODone  50 mg Oral QHS  . vancomycin  1,000 mg Intravenous Q M,W,F-HD  . vancomycin  1,000 mg Intravenous Once     acetaminophen, ALPRAZolam, oxyCODONE-acetaminophen **AND** oxyCODONE, polyethylene glycol

## 2014-11-17 ENCOUNTER — Inpatient Hospital Stay (HOSPITAL_COMMUNITY): Payer: Commercial Managed Care - HMO

## 2014-11-17 DIAGNOSIS — S272XXA Traumatic hemopneumothorax, initial encounter: Secondary | ICD-10-CM | POA: Insufficient documentation

## 2014-11-17 LAB — COMPREHENSIVE METABOLIC PANEL
ALBUMIN: 2.3 g/dL — AB (ref 3.5–5.0)
ALK PHOS: 81 U/L (ref 38–126)
ALT: 87 U/L — AB (ref 17–63)
ANION GAP: 9 (ref 5–15)
AST: 49 U/L — ABNORMAL HIGH (ref 15–41)
BILIRUBIN TOTAL: 0.8 mg/dL (ref 0.3–1.2)
BUN: 14 mg/dL (ref 6–20)
CALCIUM: 8.1 mg/dL — AB (ref 8.9–10.3)
CO2: 27 mmol/L (ref 22–32)
CREATININE: 3.37 mg/dL — AB (ref 0.61–1.24)
Chloride: 93 mmol/L — ABNORMAL LOW (ref 101–111)
GFR calc non Af Amer: 18 mL/min — ABNORMAL LOW (ref >60)
GFR, EST AFRICAN AMERICAN: 21 mL/min — AB (ref >60)
GLUCOSE: 102 mg/dL — AB (ref 65–99)
Potassium: 3.8 mmol/L (ref 3.5–5.1)
Sodium: 129 mmol/L — ABNORMAL LOW (ref 135–145)
TOTAL PROTEIN: 6.1 g/dL — AB (ref 6.5–8.1)

## 2014-11-17 LAB — RENAL FUNCTION PANEL
ALBUMIN: 2.5 g/dL — AB (ref 3.5–5.0)
Anion gap: 12 (ref 5–15)
BUN: 20 mg/dL (ref 6–20)
CO2: 23 mmol/L (ref 22–32)
CREATININE: 3.81 mg/dL — AB (ref 0.61–1.24)
Calcium: 8 mg/dL — ABNORMAL LOW (ref 8.9–10.3)
Chloride: 93 mmol/L — ABNORMAL LOW (ref 101–111)
GFR calc Af Amer: 18 mL/min — ABNORMAL LOW (ref >60)
GFR, EST NON AFRICAN AMERICAN: 16 mL/min — AB (ref >60)
GLUCOSE: 115 mg/dL — AB (ref 65–99)
PHOSPHORUS: 5.3 mg/dL — AB (ref 2.5–4.6)
POTASSIUM: 4.6 mmol/L (ref 3.5–5.1)
Sodium: 128 mmol/L — ABNORMAL LOW (ref 135–145)

## 2014-11-17 LAB — GLUCOSE, CAPILLARY
GLUCOSE-CAPILLARY: 103 mg/dL — AB (ref 65–99)
GLUCOSE-CAPILLARY: 104 mg/dL — AB (ref 65–99)
Glucose-Capillary: 94 mg/dL (ref 65–99)
Glucose-Capillary: 113 mg/dL — ABNORMAL HIGH (ref 65–99)

## 2014-11-17 LAB — CBC
HEMATOCRIT: 29.3 % — AB (ref 39.0–52.0)
HEMATOCRIT: 30.3 % — AB (ref 39.0–52.0)
HEMOGLOBIN: 9 g/dL — AB (ref 13.0–17.0)
Hemoglobin: 9.4 g/dL — ABNORMAL LOW (ref 13.0–17.0)
MCH: 31.5 pg (ref 26.0–34.0)
MCH: 31.6 pg (ref 26.0–34.0)
MCHC: 30.7 g/dL (ref 30.0–36.0)
MCHC: 31 g/dL (ref 30.0–36.0)
MCV: 102.4 fL — ABNORMAL HIGH (ref 78.0–100.0)
MCV: 102 fL — AB (ref 78.0–100.0)
PLATELETS: 162 10*3/uL (ref 150–400)
Platelets: 162 10*3/uL (ref 150–400)
RBC: 2.86 MIL/uL — AB (ref 4.22–5.81)
RBC: 2.97 MIL/uL — ABNORMAL LOW (ref 4.22–5.81)
RDW: 20.1 % — ABNORMAL HIGH (ref 11.5–15.5)
RDW: 20 % — AB (ref 11.5–15.5)
WBC: 6.3 10*3/uL (ref 4.0–10.5)
WBC: 6.4 10*3/uL (ref 4.0–10.5)

## 2014-11-17 LAB — PROTIME-INR
INR: 1.57 — ABNORMAL HIGH (ref 0.00–1.49)
Prothrombin Time: 18.8 seconds — ABNORMAL HIGH (ref 11.6–15.2)

## 2014-11-17 MED ORDER — ALTEPLASE 2 MG IJ SOLR: 2.0000 mg | Freq: Once | INTRAMUSCULAR | Status: DC | PRN

## 2014-11-17 MED ORDER — HEPARIN SODIUM (PORCINE) 1000 UNIT/ML DIALYSIS: 1000.0000 [IU] | INTRAMUSCULAR | Status: DC | PRN

## 2014-11-17 MED ORDER — LIDOCAINE HCL (PF) 1 % IJ SOLN: 5.0000 mL | INTRAMUSCULAR | Status: DC | PRN

## 2014-11-17 MED ORDER — PENTAFLUOROPROP-TETRAFLUOROETH EX AERO: 1.0000 "application " | INHALATION_SPRAY | CUTANEOUS | Status: DC | PRN

## 2014-11-17 MED ORDER — SODIUM CHLORIDE 0.9 % IV SOLN: 100.0000 mL | INTRAVENOUS | Status: DC | PRN

## 2014-11-17 MED ORDER — DARBEPOETIN ALFA 60 MCG/0.3ML IJ SOSY
60.0000 ug | PREFILLED_SYRINGE | INTRAMUSCULAR | Status: DC
  Administered 2014-11-18: 60 ug via INTRAVENOUS
  Filled 2014-11-17: qty 0.3

## 2014-11-17 MED ORDER — LIDOCAINE-PRILOCAINE 2.5-2.5 % EX CREA: 1.0000 "application " | TOPICAL_CREAM | CUTANEOUS | Status: DC | PRN

## 2014-11-17 NOTE — Progress Notes (Signed)
Subjective: No complaints   Objective: Vital signs in last 24 hours: Temp:  [97.6 F (36.4 C)-98.6 F (37 C)] 98.6 F (37 C) (10/09 0609) Pulse Rate:  [71-75] 71 (10/09 0609) Resp:  [16-17] 17 (10/09 0609) BP: (91-97)/(54-62) 92/57 mmHg (10/09 0609) SpO2:  [100 %] 100 % (10/09 0609) Last BM Date: 11/16/14  Intake/Output from previous day: 10/08 0701 - 10/09 0700 In: 400 [P.O.:400] Out: 2441 [Urine:200; Chest Tube:760] Intake/Output this shift: Total I/O In: 250 [P.O.:250] Out: -   Resp: clear to auscultation bilaterally  CT no air leak serous drainage   Lab Results:   Recent Labs  11/16/14 1155 11/17/14 0755  WBC 6.2 6.3  HGB 9.4* 9.0*  HCT 29.9* 29.3*  PLT 154 162   BMET  Recent Labs  11/16/14 0816 11/17/14 0755  NA 130* 129*  K 3.7 3.8  CL 96* 93*  CO2 25 27  GLUCOSE 101* 102*  BUN 17 14  CREATININE 3.02* 3.37*  CALCIUM 8.0* 8.1*   PT/INR  Recent Labs  11/16/14 0355 11/17/14 0755  LABPROT 17.3* 18.8*  INR 1.41 1.57*   ABG No results for input(s): PHART, HCO3 in the last 72 hours.  Invalid input(s): PCO2, PO2  Studies/Results: Dg Chest Port 1 View  11/17/2014   CLINICAL DATA:  Follow-up hemopneumothorax on the right.  EXAM: PORTABLE CHEST 1 VIEW  COMPARISON:  11/16/2014  FINDINGS: Pacemaker AICD appears unchanged. Left chest is clear. Dual lumen catheter on the right has its tip at the SVC RA junction. Right chest tubes in place. I do not see a right pneumothorax. Atelectasis in the right lower lung persists.  IMPRESSION: No pneumothorax visible on the right on today's study. Persistent atelectasis in the right lower lung.   Electronically Signed   By: Nelson Chimes M.D.   On: 11/17/2014 08:54   Dg Chest Port 1 View  11/16/2014   CLINICAL DATA:  History of right rib fractures with hemothorax.  EXAM: PORTABLE CHEST 1 VIEW  COMPARISON:  11/15/2014  FINDINGS: Right dual lumen central venous catheter and dual lead AICD are stable. Right chest  tube is also noted. Previously seen right apical pneumothorax is stable in size. There is haziness of the lung parenchyma in the right lower lobe, and possibly residual right hemothorax. Left lung is clear.  Cardiomediastinal silhouette is stably enlarged. Mediastinal contours appear intact.  Right-sided rib fractures again noted. Soft tissues are grossly normal.  IMPRESSION: Stable residual right apical pneumothorax.  Right lower lobe airspace opacity which may represent consolidation, or atelectasis. Probable small residual right hemothorax.  Stable enlargement of the cardiac silhouette.   Electronically Signed   By: Fidela Salisbury M.D.   On: 11/16/2014 12:28    Anti-infectives: Anti-infectives    Start     Dose/Rate Route Frequency Ordered Stop   11/16/14 1200  vancomycin (VANCOCIN) IVPB 1000 mg/200 mL premix     1,000 mg 200 mL/hr over 60 Minutes Intravenous  Once 11/16/14 1111 11/16/14 1333   11/15/14 1400  piperacillin-tazobactam (ZOSYN) IVPB 2.25 g     2.25 g 100 mL/hr over 30 Minutes Intravenous 3 times per day 11/15/14 0755     11/15/14 1200  vancomycin (VANCOCIN) IVPB 1000 mg/200 mL premix     1,000 mg 200 mL/hr over 60 Minutes Intravenous Every M-W-F (Hemodialysis) 11/15/14 0754     11/15/14 0200  vancomycin (VANCOCIN) IVPB 1000 mg/200 mL premix  Status:  Discontinued     1,000 mg 200 mL/hr over  60 Minutes Intravenous Every 24 hours 11/14/14 0252 11/15/14 0753   11/14/14 0800  piperacillin-tazobactam (ZOSYN) IVPB 3.375 g  Status:  Discontinued     3.375 g 12.5 mL/hr over 240 Minutes Intravenous 3 times per day 11/14/14 0240 11/15/14 0755   11/14/14 0600  piperacillin-tazobactam (ZOSYN) IVPB 3.375 g  Status:  Discontinued     3.375 g 100 mL/hr over 30 Minutes Intravenous 3 times per day 11/14/14 0233 11/14/14 0239   11/14/14 0130  vancomycin (VANCOCIN) IVPB 1000 mg/200 mL premix     1,000 mg 200 mL/hr over 60 Minutes Intravenous  Once 11/14/14 0126 11/14/14 0259   11/14/14  0115  piperacillin-tazobactam (ZOSYN) IVPB 3.375 g     3.375 g 100 mL/hr over 30 Minutes Intravenous  Once 11/14/14 0109 11/14/14 0159      Assessment/Plan: Patient Active Problem List   Diagnosis Date Noted  . Traumatic hemopneumothorax   . Acute encephalopathy 11/14/2014  . Abdominal pain 11/14/2014  . Foot lesion 11/14/2014  . Hemothorax, right 11/14/2014  . Pleural effusion, right   . Absolute anemia   . Rib fractures   . ESRD (end stage renal disease) on dialysis ([place]) 10/30/2014  . Encounter for palliative care   . Retroperitoneal hematoma   . Debility   . Pressure ulcer 09/11/2014  . Chronic atrial fibrillation ([place])   . Insomnia   . Left femoral shaft fracture ([place]) 08/25/2014  . Intertrochanteric fracture of left hip ([place])   . Chronic systolic CHF (congestive heart failure) ([place]) 08/07/2014  . Antiphospholipid antibody positive 06/03/2014  . Palliative care encounter 05/28/2014  . Chronic anticoagulation, on Xarelto, CHADSVASC 5 score 02/01/2014  . Ventricular tachycardia ([place]) 11/19/2013  . OSA (obstructive sleep apnea), intolerant to CPAP/BIPAP 11/18/2013  . Depression with anxiety 08/01/2013  . DM2 (diabetes mellitus, type 2) ([place])   . Syncope and collapse  05/19/2013  . Automatic implantable cardioverter-defibrillator in situ 11/29/2011  . Essential hypertension, benign 06/04/2010  . Persistent atrial fibrillation ([place]) 06/04/2010  . Obesity 01/21/2010  Chest tube output quite high May need chest CT at some point cxr better    LOS: 3 days    Melisssa Donner A. 11/17/2014

## 2014-11-17 NOTE — Progress Notes (Addendum)
[facility]                                                                              Patient Demographics  Larry [name], is a 64 y.o. male, DOB - 1951-01-06, PJA:[id number]  Admit date - 11/13/2014   Admitting Physician Ivor Costa, MD  Outpatient Primary MD for the patient is [name], MD  LOS - 3   Chief Complaint  Patient presents with  . Altered Mental Status      HPI on 11/14/2014 by Dr. Felisa Bonier is a 64 y.o. 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  The patient's wife, patient has been mildly confused in the past 7 days. He has mild cough and shortness of breath, but no chest pain, fever or chills. Patient complains of abdominal pain. It is located in the right abdomen, constant, mild, radiating to the right shoulder, which has been going on for few weeks after he had a fall. He is constipated, no nausea, vomiting, diarrhea. He does not have symptoms of UTI. Patient done does not seem to have unilateral weakness, vision changes, hearing loss.  In ED, patient was found to have WBC 6.0, temperature normal, no tachycardia, creatinine 2.50, BUN 15, potassium 4.6, lactate of 1.62, INR 2.94. Chest x-ray showed new large right pleural effusion. CT-head showed remote right MCA infarction, unchanged pituitary adenoma, mild generalized atrophy and chronic small vessel disease, no acute findings.   Assessment & Plan   Right Rib Fractures with hemothorax -Secondary to falls -Seen on CT abdomen -Trauma surgery consulted and appreciated, chest tube inserted with output -Coumadin discontiued  Acute  encephalopathy -Improved, Likely secondary to the above -CT head: no acute findings -Currently afebrile, no leukocytosis, however patient started on vanc/zosyn -Xanax and Temazepam held -PT recommended SNF  Essential hypertension -Blood pressure is soft on admission. -continue midodrine  Macrocytic Anemia -Hb 9.0,drop likely secondary to hemothorax/hemorrhage -Baseline Hb 11 -Continue to monitor CBC  Diabetes mellitus, type 2 -Last A1c 7.7 on 06/26/14 -Continue lantus, ISS, CBG monitoring  Depression with anxiety: -hold Xanax due to AMS -continue cymbalta  Atrial Fibrillation -CHADSVASC 5 -Currently rate controlled -Was on coumadin, however, discontinued due to hemothorax and continued right retroperitoneal hemorrhage. -INR 1.57 (Vitamin K given on 11/14/2014) -Continue amiodarone -Discussed discontinuation of coumadin with wife.   -Cardiology consulted and appreciated, agreed with holding coumadin until bleeding stops  Antiphospholipid antibody -coumadin discontinued, discussed risks and benefits with the wife, she would like input from cardiology -Per Wife, patient has not had PE or DVT  Chronic systolic CHF (congestive heart failure) -Echocardiogram on 02/02/14 showed EF 20-25%.  -Currently compensated -Managed by dialysis  ESRD (end stage renal disease) on dialysis (MWFS) -Nephrology consulted and appreciated  Abdominal pain/ Large Right Retroperitoneal hemorrhage/ Elevated transaminases -CT abdomen/pelvis: no new abnormality  -Lipase 20 -AST/ALT improving- continue to monitor  Dyspnea/Cough -Improving, Secondary to above, continue treatment plan  as above  Multiple wounds -Stage 3 left heel pressure ulcer, left dorsal wound, right dorsal wound -Wound care consulted, unna boots placed   Code Status: DNR  Family Communication: Wife at bedside   Disposition Plan: Admitted.   Time Spent in minutes   30 minutes  Procedures  Chest tube  inserted  Consults   Trauma surgery Nephrology Cardiology  DVT Prophylaxis  SCDs  Lab Results  Component Value Date   PLT 162 11/17/2014    Medications  Scheduled Meds: . amiodarone  200 mg Oral Daily  . atorvastatin  40 mg Oral Daily  . bisacodyl  5-10 mg Oral QHS  . collagenase   Topical Daily  . DULoxetine  60 mg Oral BID  . insulin aspart  0-9 Units Subcutaneous TID WC  . insulin glargine  10 Units Subcutaneous QHS  . levothyroxine  75 mcg Oral QAC breakfast  . lidocaine-EPINEPHrine  30 mL Intradermal Once  . midodrine  10 mg Oral TID WC  . multivitamin  1 tablet Oral QHS  . piperacillin-tazobactam (ZOSYN)  IV  2.25 g Intravenous 3 times per day  . sevelamer carbonate  800 mg Oral TID WC  . sildenafil  80 mg Oral TID  . sodium chloride  3 mL Intravenous Q12H  . traZODone  50 mg Oral QHS  . vancomycin  1,000 mg Intravenous Q M,W,F-HD   Continuous Infusions:   PRN Meds:.acetaminophen, ALPRAZolam, oxyCODONE-acetaminophen **AND** oxyCODONE, polyethylene glycol  Antibiotics    Anti-infectives    Start     Dose/Rate Route Frequency Ordered Stop   11/16/14 1200  vancomycin (VANCOCIN) IVPB 1000 mg/200 mL premix     1,000 mg 200 mL/hr over 60 Minutes Intravenous  Once 11/16/14 1111 11/16/14 1333   11/15/14 1400  piperacillin-tazobactam (ZOSYN) IVPB 2.25 g     2.25 g 100 mL/hr over 30 Minutes Intravenous 3 times per day 11/15/14 0755     11/15/14 1200  vancomycin (VANCOCIN) IVPB 1000 mg/200 mL premix     1,000 mg 200 mL/hr over 60 Minutes Intravenous Every M-W-F (Hemodialysis) 11/15/14 0754     11/15/14 0200  vancomycin (VANCOCIN) IVPB 1000 mg/200 mL premix  Status:  Discontinued     1,000 mg 200 mL/hr over 60 Minutes Intravenous Every 24 hours 11/14/14 0252 11/15/14 0753   11/14/14 0800  piperacillin-tazobactam (ZOSYN) IVPB 3.375 g  Status:  Discontinued     3.375 g 12.5 mL/hr over 240 Minutes Intravenous 3 times per day 11/14/14 0240 11/15/14 0755   11/14/14 0600   piperacillin-tazobactam (ZOSYN) IVPB 3.375 g  Status:  Discontinued     3.375 g 100 mL/hr over 30 Minutes Intravenous 3 times per day 11/14/14 0233 11/14/14 0239   11/14/14 0130  vancomycin (VANCOCIN) IVPB 1000 mg/200 mL premix     1,000 mg 200 mL/hr over 60 Minutes Intravenous  Once 11/14/14 0126 11/14/14 0259   11/14/14 0115  piperacillin-tazobactam (ZOSYN) IVPB 3.375 g     3.375 g 100 mL/hr over 30 Minutes Intravenous  Once 11/14/14 0109 11/14/14 0159      Subjective:   Larry [name] seen and examined today.  Feels breathing has improved and would like to know when he can go home.  Denies chest pain, abdominal pain, N/V/D/C.    Objective:   Filed Vitals:   11/16/14 1014 11/16/14 1146 11/16/14 2104 11/17/14 0609  BP: 88/52 91/54 97/62  92/57  Pulse: 73 75 71 71  Temp: 97.5 F (36.4 C) 97.6 F (36.4 C) 98.4  F (36.9 C) 98.6 F (37 C)  TempSrc: Oral Oral Oral   Resp: 14 16 17 17   Height:      Weight: 95.2 kg (209 lb 14.1 oz)     SpO2:  100% 100% 100%    Wt Readings from Last 3 Encounters:  11/16/14 95.2 kg (209 lb 14.1 oz)  10/11/14 97.9 kg (215 lb 13.3 oz)  08/24/14 102.4 kg (225 lb 12 oz)     Intake/Output Summary (Last 24 hours) at 11/17/14 1154 Last data filed at 11/17/14 7062  Gross per 24 hour  Intake    500 ml  Output    600 ml  Net   -100 ml    Exam  General: Well developed, well nourished, NAD  HEENT: NCAT, mucous membranes moist.   Cardiovascular: S1 S2 auscultated, no murmurs  Respiratory: Clear to ausculation (R chest tube in place)  Abdomen: Soft, nontender, nondistended, + bowel sounds  Extremities: warm dry without cyanosis clubbing, Unna boots in place  Neuro: AAOx2, nonfocal  Data Review   Micro Results Recent Results (from the past 240 hour(s))  Culture, blood (routine x 2)     Status: None (Preliminary result)   Collection Time: 11/13/14 11:48 PM  Result Value Ref Range Status   Specimen Description BLOOD LEFT FOREARM  Final    Special Requests BOTTLES DRAWN AEROBIC ONLY 3CC  Final   Culture NO GROWTH 3 DAYS  Final   Report Status PENDING  Incomplete  Culture, blood (routine x 2)     Status: None (Preliminary result)   Collection Time: 11/14/14 12:05 AM  Result Value Ref Range Status   Specimen Description BLOOD LEFT ARM  Final   Special Requests BOTTLES DRAWN AEROBIC AND ANAEROBIC 5CC  Final   Culture NO GROWTH 3 DAYS  Final   Report Status PENDING  Incomplete  MRSA PCR Screening     Status: None   Collection Time: 11/14/14  3:05 AM  Result Value Ref Range Status   MRSA by PCR NEGATIVE NEGATIVE Final    Comment:        The GeneXpert MRSA Assay (FDA approved for NASAL specimens only), is one component of a comprehensive MRSA colonization surveillance program. It is not intended to diagnose MRSA infection nor to guide or monitor treatment for MRSA infections.   Urine culture     Status: None   Collection Time: 11/15/14  1:16 AM  Result Value Ref Range Status   Specimen Description URINE, RANDOM  Final   Special Requests NONE  Final   Culture NO GROWTH 1 DAY  Final   Report Status 11/16/2014 FINAL  Final    Radiology Reports Ct Abdomen Pelvis Wo Contrast  11/14/2014   ADDENDUM REPORT: 11/14/2014 08:10 ADDENDUM: Study discussed by telephone with Dr. Ree Kida On 11/14/2014 at 0800 hours. Electronically Signed   By: Genevie Ann M.D.   On: 11/14/2014 08:10  11/14/2014   CLINICAL DATA:  64 year old male with constant right side abdominal pain. Retroperitoneal hemorrhage in August. Subsequent encounter.  EXAM: CT ABDOMEN AND PELVIS WITHOUT CONTRAST  TECHNIQUE: Multidetector CT imaging of the abdomen and pelvis was performed following the standard protocol without IV contrast.  COMPARISON:  09/15/2014 and earlier.  FINDINGS: Increased right pleural effusion, now almost completely opacifying the visible right lung base. On this noncontrast exam there are multiple round and nodular intermediate density foci within  the right pleural space, individually up to 4 cm (series 2, image 19), new since August. The  right lower lobe is partially collapsed and its airways partially filled with fluid ( "Drowned lung" ). There are parenchymal calcifications suggesting granulomas.  Cardiomegaly with no pericardial effusion. No left pleural effusion and left lower lobe ventilation has improved.  There are displaced lateral right eighth and ninth rib fractures which are new since August (series 3). These are in proximity to the intermediate density material in the right pleural space. Lower thoracic and lumbar vertebrae appear intact. Bilateral SI joint ankylosis. Degenerative changes at the hips. Proximal left femur orthopedic hardware. No other acute osseous abnormality identified.  Bilateral flank anasarca has increased and now tracks to the pelvis. The large right retroperitoneal hemorrhage has decreased but not resolved, now encompassing 12 x 15.5 x 14 cm (AP by transverse by CC) versus 17 x 19 x 24 cm previously. Layering hematocrit again noted. The right psoas muscle is less abnormal now. Regional mass effect including anterior displacement of the right kidney is stable to mildly regressed.  Interval regressed free fluid in the abdomen.  Stable presacral stranding. Stable distal colon with intermittent retained stool. There is no longer gas in the urinary bladder. Diverticulosis of the sigmoid colon with no definite active inflammation. Mild gaseous distension of the left colon, transverse colon and right colon. Oral contrast has not reached the distal small bowel. No dilated or abnormal small bowel loops. Fat containing umbilical hernia. Negative stomach and duodenum.  The gallbladder is mildly distended, no pericholecystic stranding. Stable noncontrast liver parenchyma, spleen, pancreas, adrenal glands, and kidneys. Aortoiliac calcified atherosclerosis noted. Stable maximal celiac axis lymph nodes up to 14 mm.  IMPRESSION: 1. Large  volume right hemothorax with new displaced right lateral eighth and ninth rib fractures. Complete atelectasis of the visible right lung. 2. Regressed but not resolved large right retroperitoneal hemorrhage since August. Residual hemorrhage volume approximates 1,302 mL. 3. No new abnormality in the abdomen or pelvis.  Electronically Signed: By: Genevie Ann M.D. On: 11/14/2014 07:52   Ct Head Wo Contrast  11/14/2014   CLINICAL DATA:  Altered mental status.  EXAM: CT HEAD WITHOUT CONTRAST  TECHNIQUE: Contiguous axial images were obtained from the base of the skull through the vertex without intravenous contrast.  COMPARISON:  08/25/2014, 02/01/2014  FINDINGS: There is no intracranial hemorrhage or extra-axial fluid collection. There is no evidence of acute infarction. There is encephalomalacia due to remote right posterior MCA infarction. There is mild generalized atrophy. There is mild periventricular hypodensity consistent with chronic small vessel disease. Prominent pituitary soft tissues are consistent with the previously described adenoma. Visible paranasal sinuses are clear.  IMPRESSION: 1. Remote right MCA infarction. 2. Unchanged pituitary adenoma. 3. Mild generalized atrophy and chronic small vessel disease. 4. No acute findings   Electronically Signed   By: Andreas Newport M.D.   On: 11/14/2014 00:53   Dg Chest Port 1 View  11/17/2014   CLINICAL DATA:  Follow-up hemopneumothorax on the right.  EXAM: PORTABLE CHEST 1 VIEW  COMPARISON:  11/16/2014  FINDINGS: Pacemaker AICD appears unchanged. Left chest is clear. Dual lumen catheter on the right has its tip at the SVC RA junction. Right chest tubes in place. I do not see a right pneumothorax. Atelectasis in the right lower lung persists.  IMPRESSION: No pneumothorax visible on the right on today's study. Persistent atelectasis in the right lower lung.   Electronically Signed   By: Nelson Chimes M.D.   On: 11/17/2014 08:54   Dg Chest Port 1 View  11/16/2014  CLINICAL DATA:  History of right rib fractures with hemothorax.  EXAM: PORTABLE CHEST 1 VIEW  COMPARISON:  11/15/2014  FINDINGS: Right dual lumen central venous catheter and dual lead AICD are stable. Right chest tube is also noted. Previously seen right apical pneumothorax is stable in size. There is haziness of the lung parenchyma in the right lower lobe, and possibly residual right hemothorax. Left lung is clear.  Cardiomediastinal silhouette is stably enlarged. Mediastinal contours appear intact.  Right-sided rib fractures again noted. Soft tissues are grossly normal.  IMPRESSION: Stable residual right apical pneumothorax.  Right lower lobe airspace opacity which may represent consolidation, or atelectasis. Probable small residual right hemothorax.  Stable enlargement of the cardiac silhouette.   Electronically Signed   By: Fidela Salisbury M.D.   On: 11/16/2014 12:28   Dg Chest Port 1 View  11/15/2014   CLINICAL DATA:  Traumatic hemopneumothorax  EXAM: PORTABLE CHEST 1 VIEW  COMPARISON:  November 14, 2014  FINDINGS: Chest tube is present on the right. There is a minimal right apical pneumothorax. There is persistent pleural effusion with patchy airspace consolidation in the mid and lower lung zones on the right. Left lung is clear except for mild left base atelectasis. Heart is enlarged with pulmonary vascularity within normal limits. Pacemaker leads are attached to the right atrium and right ventricle. Central catheter tip is in the right atrium slightly beyond the cavoatrial junction. There is again noted a displaced right lateral eighth rib fracture.  IMPRESSION: Minimal right apical pneumothorax. Right effusion as well as airspace consolidation in the right mid and lower lung zones is present. Displaced right eighth rib fracture again noted. Left lung clear except for mild left base atelectasis. Stable cardiomegaly.   Electronically Signed   By: Lowella Grip III M.D.   On: 11/15/2014 08:00   Dg  Chest Port 1 View  11/14/2014   CLINICAL DATA:  64 year old male right side chest tube placement for hemothorax. Initial encounter.  EXAM: PORTABLE CHEST 1 VIEW  COMPARISON:  CT Abdomen and Pelvis 0733 hours today, and earlier  FINDINGS: Portable AP view at 1611 hours. New right chest tube placed. Significantly reduced right pleural effusion/hemothorax volume. Improved right lung ventilation. No pneumothorax identified. Displaced right eighth rib fracture re- identified.  Stable cardiomegaly and mediastinal contours. Stable right IJ approach dual lumen dialysis type catheter. Stable left chest cardiac pacemaker. Residual right lung base atelectasis.  IMPRESSION: Regressed right pleural effusion/ hemothorax and improved right lung ventilation status post right chest tube placement. No pneumothorax identified.   Electronically Signed   By: Genevie Ann M.D.   On: 11/14/2014 16:19   Dg Chest Port 1 View  11/13/2014   CLINICAL DATA:  Short of breath  EXAM: PORTABLE CHEST 1 VIEW  COMPARISON:  09/04/2014  FINDINGS: Tunneled right internal jugular dialysis catheter in place. Tip is at the cavoatrial junction. Right PICC is been removed. Left subclavian AICD device and leads are stable. Large right pleural effusion has developed. This obscures the right lower lung.  IMPRESSION: New large right pleural effusion.   Electronically Signed   By: Marybelle Killings M.D.   On: 11/13/2014 23:43    CBC  Recent Labs Lab 11/14/14 0006 11/14/14 0012 11/14/14 0500 11/15/14 0409 11/16/14 1155 11/17/14 0755  WBC 6.0  --  5.9 6.2 6.2 6.3  HGB 9.1* 11.2* 8.9* 9.5* 9.4* 9.0*  HCT 29.2* 33.0* 28.0* 30.4* 29.9* 29.3*  PLT 228  --  204 210 154 162  MCV 101.7*  --  101.8* 103.1* 102.0* 102.4*  MCH 31.7  --  32.4 32.2 32.1 31.5  MCHC 31.2  --  31.8 31.3 31.4 30.7  RDW 19.5*  --  20.0* 20.1* 20.0* 20.1*  LYMPHSABS 0.7  --   --   --   --   --   MONOABS 0.9  --   --   --   --   --   EOSABS 0.2  --   --   --   --   --   BASOSABS 0.1   --   --   --   --   --     Chemistries   Recent Labs Lab 11/14/14 0006  11/14/14 0329 11/15/14 0409 11/16/14 0815 11/16/14 0816 11/17/14 0755  NA 130*  < > 130* 127* 130* 130* 129*  K 4.7  < > 4.5 4.8 3.7 3.7 3.8  CL 93*  < > 93* 91* 96* 96* 93*  CO2 26  --  26 24 26 25 27   GLUCOSE 124*  < > 132* 114* 103* 101* 102*  BUN 12  < > 13 22* 16 17 14   CREATININE 2.67*  < > 2.75* 3.78* 3.06* 3.02* 3.37*  CALCIUM 8.3*  --  8.1* 8.2* 8.0* 8.0* 8.1*  AST 329*  --  275* 167*  --   --  49*  ALT 236*  --  214* 175*  --   --  87*  ALKPHOS 119  --  108 94  --   --  81  BILITOT 0.8  --  0.9 1.0  --   --  0.8  < > = values in this interval not displayed. ------------------------------------------------------------------------------------------------------------------ estimated creatinine clearance is 26.3 mL/min (by C-G formula based on Cr of 3.37). ------------------------------------------------------------------------------------------------------------------ No results for input(s): HGBA1C in the last 72 hours. ------------------------------------------------------------------------------------------------------------------ No results for input(s): CHOL, HDL, LDLCALC, TRIG, CHOLHDL, LDLDIRECT in the last 72 hours. ------------------------------------------------------------------------------------------------------------------ No results for input(s): TSH, T4TOTAL, T3FREE, THYROIDAB in the last 72 hours.  Invalid input(s): FREET3 ------------------------------------------------------------------------------------------------------------------ No results for input(s): VITAMINB12, FOLATE, FERRITIN, TIBC, IRON, RETICCTPCT in the last 72 hours.  Coagulation profile  Recent Labs Lab 11/14/14 0006 11/14/14 1316 11/15/14 0409 11/16/14 0355 11/17/14 0755  INR 2.94* 2.47* 1.62* 1.41 1.57*    No results for input(s): DDIMER in the last 72 hours.  Cardiac Enzymes  Recent Labs Lab  11/14/14 0006  TROPONINI 0.06*   ------------------------------------------------------------------------------------------------------------------ Invalid input(s): POCBNP    Imojean Yoshino D.O. on 11/17/2014 at 11:54 AM  Between 7am to 7pm - Pager - [phone]  After 7pm go to [email or web address] - password [email or web address]  And look for the night coverage person covering for me after hours  [facility] Office  [phone]

## 2014-11-17 NOTE — Progress Notes (Signed)
Subjective:   Breathing much better, has anxiety on HD.   Objective Filed Vitals:   11/16/14 1014 11/16/14 1146 11/16/14 2104 11/17/14 0609  BP: 88/52 91/54 97/62  92/57  Pulse: 73 75 71 71  Temp: 97.5 F (36.4 C) 97.6 F (36.4 C) 98.4 F (36.9 C) 98.6 F (37 C)  TempSrc: Oral Oral Oral   Resp: 14 16 17 17   Height:      Weight: 95.2 kg (209 lb 14.1 oz)     SpO2:  100% 100% 100%   Physical Exam General:alert and oriented, sitting in chair, no acute distress Heart: RRR 2/6 Lungs: CTA, R chest tube patent - serosang drainage Abdomen: soft, nontender +BS Extremities: bilat LE edema. Legs wrapped Dialysis Access:  R IJ cath.   R AVF maturing +b/t   MWFS   4hrs 95kgs 4K/2.25 Ca Cath No heparin P4 micera 50 q 2 weeks- last given9/28  Assessment/Plan: 1. AMS- back to baseline- caution with xanax 2. Rib fractures/ hemothorax- trauma surgery following. Better after chest tube. No heparin in HD, no coumadin 3. ESRD - MWFS, HD tomorrow, K+3.8 use 4 K bath 4. 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 Has LE edema, may need lower dry weight, get vol down some more if BP will tolerate.cont midodrine 5. Anemia - on mircera at OP unit, hgb 9/ dose Aranesp this week 6. Metabolic bone disease - cont renvela, last phos 6.7 and PTH 177 7. Nutrition - MVI. Alb 2.3 reports appetite much improved  8. Chronic hypotension on midodrine- SBP in the 90s 9. AFib on amiodarone, coumadin stopped due to bleeding events (recent RP bleed, then hemothorax) and hx of falls- on amiodarone 10. Cellulitis- on Vanc 11. Anxiety- gets VERY anxious end of HD. Will run 15 mins shorter per request. 12. DNR  Shelle Iron, NP [place] [phone] 11/17/2014,12:21 PM  LOS: 3 days   Pt seen, examined and agree w A/P as above.  Kelly Splinter MD [facility] pager [phone]    cell [phone] 11/17/2014, 1:33 PM    Additional  Objective Labs: Basic Metabolic Panel:  Recent Labs Lab 11/16/14 0815 11/16/14 0816 11/17/14 0755  NA 130* 130* 129*  K 3.7 3.7 3.8  CL 96* 96* 93*  CO2 26 25 27   GLUCOSE 103* 101* 102*  BUN 16 17 14   CREATININE 3.06* 3.02* 3.37*  CALCIUM 8.0* 8.0* 8.1*  PHOS 4.4  --   --    Liver Function Tests:  Recent Labs Lab 11/14/14 0329 11/15/14 0409 11/16/14 0815 11/17/14 0755  AST 275* 167*  --  49*  ALT 214* 175*  --  87*  ALKPHOS 108 94  --  81  BILITOT 0.9 1.0  --  0.8  PROT 6.4* 6.3*  --  6.1*  ALBUMIN 2.7* 2.5* 2.2* 2.3*    Recent Labs Lab 11/14/14 0329  LIPASE 20*   CBC:  Recent Labs Lab 11/14/14 0006  11/14/14 0500 11/15/14 0409 11/16/14 1155 11/17/14 0755  WBC 6.0  --  5.9 6.2 6.2 6.3  NEUTROABS 4.1  --   --   --   --   --   HGB 9.1*  < > 8.9* 9.5* 9.4* 9.0*  HCT 29.2*  < > 28.0* 30.4* 29.9* 29.3*  MCV 101.7*  --  101.8* 103.1* 102.0* 102.4*  PLT 228  --  204 210 154 162  < > = values in this interval not displayed.  Blood Culture    Component Value Date/Time   SDES URINE, RANDOM 11/15/2014 0116   SPECREQUEST NONE 11/15/2014 0116   CULT NO GROWTH 1 DAY 11/15/2014 0116   REPTSTATUS 11/16/2014 FINAL 11/15/2014 0116    Cardiac Enzymes:  Recent Labs Lab 11/14/14 0006  TROPONINI 0.06*   CBG:  Recent Labs Lab 11/16/14 0609 11/16/14 1031 11/16/14 2250 11/17/14 0749 11/17/14 1115  GLUCAP 86 83 125* 94 103*   Iron Studies: No results for input(s): IRON, TIBC, TRANSFERRIN, FERRITIN in the last 72 hours. @lablastinr3 @ Studies/Results: Dg Chest Port 1 View  11/17/2014   CLINICAL DATA:  Follow-up hemopneumothorax on the right.  EXAM: PORTABLE CHEST 1 VIEW  COMPARISON:  11/16/2014  FINDINGS: Pacemaker AICD appears unchanged. Left chest is clear. Dual lumen catheter on the right has its tip at the SVC RA junction. Right chest tubes in place. I do not see a right pneumothorax. Atelectasis in the right lower lung persists.  IMPRESSION: No  pneumothorax visible on the right on today's study. Persistent atelectasis in the right lower lung.   Electronically Signed   By: Nelson Chimes M.D.   On: 11/17/2014 08:54   Dg Chest Port 1 View  11/16/2014   CLINICAL DATA:  History of right rib fractures with hemothorax.  EXAM: PORTABLE CHEST 1 VIEW  COMPARISON:  11/15/2014  FINDINGS: Right dual lumen central venous catheter and dual lead AICD are stable. Right chest tube is also noted. Previously seen right apical pneumothorax is stable in size. There is haziness of the lung parenchyma in the right lower lobe, and possibly residual right hemothorax. Left lung is clear.  Cardiomediastinal silhouette is stably enlarged. Mediastinal contours appear intact.  Right-sided rib fractures again noted. Soft tissues are grossly normal.  IMPRESSION: Stable residual right apical pneumothorax.  Right lower lobe airspace opacity which may represent consolidation, or atelectasis. Probable small residual right hemothorax.  Stable enlargement of the cardiac silhouette.   Electronically Signed   By: Fidela Salisbury M.D.   On: 11/16/2014 12:28   Medications:   . amiodarone  200 mg Oral Daily  . atorvastatin  40 mg Oral Daily  . bisacodyl  5-10 mg Oral QHS  . collagenase   Topical Daily  . DULoxetine  60 mg Oral BID  . insulin aspart  0-9 Units Subcutaneous TID WC  . insulin glargine  10 Units Subcutaneous QHS  . levothyroxine  75 mcg Oral QAC breakfast  . lidocaine-EPINEPHrine  30 mL Intradermal Once  . midodrine  10 mg Oral TID WC  . multivitamin  1 tablet Oral QHS  . piperacillin-tazobactam (ZOSYN)  IV  2.25 g Intravenous 3 times per day  . sevelamer carbonate  800 mg Oral TID WC  . sildenafil  80 mg Oral TID  . sodium chloride  3 mL Intravenous Q12H  . traZODone  50 mg Oral QHS  . vancomycin  1,000 mg Intravenous Q M,W,F-HD

## 2014-11-18 ENCOUNTER — Encounter (HOSPITAL_COMMUNITY): Payer: Self-pay | Admitting: General Practice

## 2014-11-18 LAB — CBC
HEMATOCRIT: 28.9 % — AB (ref 39.0–52.0)
HEMOGLOBIN: 8.9 g/dL — AB (ref 13.0–17.0)
MCH: 31.1 pg (ref 26.0–34.0)
MCHC: 30.8 g/dL (ref 30.0–36.0)
MCV: 101 fL — ABNORMAL HIGH (ref 78.0–100.0)
Platelets: 161 10*3/uL (ref 150–400)
RBC: 2.86 MIL/uL — ABNORMAL LOW (ref 4.22–5.81)
RDW: 20 % — ABNORMAL HIGH (ref 11.5–15.5)
WBC: 5.7 10*3/uL (ref 4.0–10.5)

## 2014-11-18 LAB — PROTIME-INR
INR: 1.48 (ref 0.00–1.49)
Prothrombin Time: 18 seconds — ABNORMAL HIGH (ref 11.6–15.2)

## 2014-11-18 LAB — BASIC METABOLIC PANEL
Anion gap: 13 (ref 5–15)
BUN: 22 mg/dL — ABNORMAL HIGH (ref 6–20)
CHLORIDE: 96 mmol/L — AB (ref 101–111)
CO2: 22 mmol/L (ref 22–32)
CREATININE: 4.1 mg/dL — AB (ref 0.61–1.24)
Calcium: 8.3 mg/dL — ABNORMAL LOW (ref 8.9–10.3)
GFR calc non Af Amer: 14 mL/min — ABNORMAL LOW (ref >60)
GFR, EST AFRICAN AMERICAN: 17 mL/min — AB (ref >60)
Glucose, Bld: 88 mg/dL (ref 65–99)
POTASSIUM: 4.3 mmol/L (ref 3.5–5.1)
Sodium: 131 mmol/L — ABNORMAL LOW (ref 135–145)

## 2014-11-18 LAB — GLUCOSE, CAPILLARY
GLUCOSE-CAPILLARY: 75 mg/dL (ref 65–99)
GLUCOSE-CAPILLARY: 101 mg/dL — AB (ref 65–99)
GLUCOSE-CAPILLARY: 132 mg/dL — AB (ref 65–99)

## 2014-11-18 MED ORDER — DARBEPOETIN ALFA 60 MCG/0.3ML IJ SOSY
PREFILLED_SYRINGE | INTRAMUSCULAR | Status: AC
  Administered 2014-11-18: 13:00:00
  Filled 2014-11-18: qty 0.3

## 2014-11-18 MED ORDER — MIDODRINE HCL 5 MG PO TABS
ORAL_TABLET | ORAL | Status: AC
  Filled 2014-11-18: qty 2

## 2014-11-18 NOTE — Progress Notes (Signed)
PT Cancellation Note  Patient Details Name: Larry [name] MRN: [id number] DOB: [birth year]   Cancelled Treatment:    Reason Eval/Treat Not Completed: Patient at procedure or test/unavailable pt off floor at HD. Will follow up next available time.    Marguarite Arbour A Liany Mumpower  11/18/2014, 9:45 AM  Wray Kearns, PT, DPT [phone]

## 2014-11-18 NOTE — Progress Notes (Signed)
Patient ID: Larry [name], male   DOB: 1950-02-24, 64 y.o.   MRN: [id number]   LOS: 4 days   Subjective: No new c/o.   Objective: Vital signs in last 24 hours: Temp:  [97.9 F (36.6 C)-98.5 F (36.9 C)] 98.2 F (36.8 C) (10/10 1043) Pulse Rate:  [50-89] 77 (10/10 1043) Resp:  [11-16] 13 (10/10 1043) BP: (84-99)/(54-62) 94/59 mmHg (10/10 1043) SpO2:  [94 %-100 %] 96 % (10/10 1043) Weight:  [94.7 kg (208 lb 12.4 oz)] 94.7 kg (208 lb 12.4 oz) (10/10 1043) Last BM Date: 11/16/14   CT No air leak 35ml/24h (doubt that's real) Pleurivac full   Laboratory  CBC  Recent Labs  11/17/14 2021 11/18/14 0406  WBC 6.4 5.7  HGB 9.4* 8.9*  HCT 30.3* 28.9*  PLT 162 161   BMET  Recent Labs  11/17/14 2021 11/18/14 0406  NA 128* 131*  K 4.6 4.3  CL 93* 96*  CO2 23 22  GLUCOSE 115* 88  BUN 20 22*  CREATININE 3.81* 4.10*  CALCIUM 8.0* 8.3*    Physical Exam General appearance: alert and no distress Resp: clear to auscultation bilaterally Cardio: regular rate and rhythm   Assessment/Plan: Fall Right rib fxs w/HPTX -- CT to water seal Multiple medical problems -- per primary service    Lisette Abu, PA-C Pager: [phone] General Trauma PA Pager: [phone]  11/18/2014

## 2014-11-18 NOTE — Evaluation (Signed)
Occupational Therapy Evaluation Patient Details Name: Larry [name] MRN: [id number] DOB: Jul 04, 1950 Today's Date: 11/18/2014    History of Present Illness Larry [name] is a 64 y.o. male admitted from [facility] with encephalopathy and R pleural effusion. He apparently fell 3x while at [facility] and also has 2right rib fx. Pt with recent admit July-Sept after left hip fx s/p IM nail, pacemaker, initiation of HD due to ESRD. PMHx NICM, CAD, DDD, back pain, CVA, obesity.    Clinical Impression   Pt admitted to hospital due to reason stated above. Pt currently with functional limitiations due to the deficits listed below (see OT problem list). Prior to admission pt was receiving assistance from SNF staff for LB dressing and bathing. Pt currently requires set up to maximal assistance for safety and ADL completion. Pt's wife demonstrates emotional distress during session due to pt's recent medical problems. Pt will benefit from skilled OT to increase independence and safety with ADLs and balance to allow discharge to venue listed below.    Follow Up Recommendations  SNF    Equipment Recommendations  Other (comment) (TBD next venue)    Recommendations for Other Services       Precautions / Restrictions Precautions Precautions: Fall, Chest Tube Rt Upper Abdomen Restrictions Weight Bearing Restrictions: No      Mobility Bed Mobility Overal bed mobility: Needs Assistance Bed Mobility: Rolling;Sidelying to Sit Rolling: Min assist Sidelying to sit: Mod assist;+2 for physical assistance       General bed mobility comments: Pt required verbal cues for log roll technique and required assistance to position bil LE off of bed. Pt required +2 mod assist once in sidelying position to bring trunk into upright position. Therapist used chuck pad to straighten pt's hips and bring pt's feet flat on floor.  Transfers Overall transfer level: Needs assistance Equipment used: Rolling walker (2  wheeled) Transfers: Sit to/from [facility] Sit to Stand: Mod assist Stand pivot transfers: Min guard       General transfer comment: required mod assist to power up from bed, once standing pt was able to take a few steps to the left to sit in chair    Balance Overall balance assessment: Needs assistance Sitting-balance support: No upper extremity supported;Feet supported Sitting balance-Leahy Scale: Good     Standing balance support: Bilateral upper extremity supported;No upper extremity supported Standing balance-Leahy Scale: Poor                              ADL Overall ADL's : Needs assistance/impaired Eating/Feeding: Set up;Sitting   Grooming: Set up;Sitting   Upper Body Bathing: Minimal assitance;Sitting   Lower Body Bathing: Maximal assistance;Sitting/lateral leans   Upper Body Dressing : Minimal assistance;Sitting   Lower Body Dressing: Maximal assistance;Sitting/lateral leans;Sit to/from stand   Toilet Transfer: Moderate assistance;Stand-pivot;RW;BSC   Toileting- Clothing Manipulation and Hygiene: Minimal assistance;Sitting/lateral lean         General ADL Comments: Pt reports recieving assistance for LB dressing and bathing. Pt unable to don bil LE socks     Vision     Perception     Praxis      Pertinent Vitals/Pain Pain Assessment: 0-10 Pain Score: 8  Pain Location: back, Rt abdomen Pain Descriptors / Indicators: Aching;Stabbing Pain Intervention(s): Monitored during session;Premedicated before session;Repositioned     Hand Dominance Right   Extremity/Trunk Assessment Upper Extremity Assessment Upper Extremity Assessment: Overall WFL for tasks assessed  Lower Extremity Assessment Lower Extremity Assessment: Defer to PT evaluation   Cervical / Trunk Assessment Cervical / Trunk Assessment: Kyphotic   Communication Communication Communication: No difficulties   Cognition Arousal/Alertness:  Awake/alert Behavior During Therapy: WFL for tasks assessed/performed Overall Cognitive Status: Within Functional Limits for tasks assessed                     General Comments   Therapist noted ulcers on bil LE feet and nurse applied wound dressing prior to therapist donning bil LE socks. Therapist noted bil hand edema and educated pt and pt's wife on edema managment techniques, such as fist pumps using therapy squeeze ball, adding pillows to elevate bil hands and ice to assist with decrease edema. Pt and pt's wife both able to verbalize understanding regarding same.    Exercises       Shoulder Instructions      Home Living Family/patient expects to be discharged to:: Skilled nursing facility Living Arrangements: Spouse/significant other Available Help at Discharge: Family;Available 24 hours/day Type of Home: House Home Access: Stairs to enter [employer] of Steps: 2 Entrance Stairs-Rails: None Home Layout: Two level;Bed/bath upstairs;1/2 bath on main level Alternate Level Stairs-Number of Steps: 14 Alternate Level Stairs-Rails: Right Bathroom Shower/Tub: Occupational psychologist: Standard     Home Equipment: None   Additional Comments: Pt's wife present during session, pt and pt's wife want pt to return back to a SNF      Prior Functioning/Environment Level of Independence: Needs assistance    ADL's / Homemaking Assistance Needed: Pt was recieving assistance from SNF staff for LB dressing and bathing.        OT Diagnosis: Generalized weakness;Cognitive deficits;Acute pain   OT Problem List: Decreased strength;Decreased activity tolerance;Impaired balance (sitting and/or standing);Decreased cognition;Pain   OT Treatment/Interventions: Self-care/ADL training;Therapeutic exercise;DME and/or AE instruction;Therapeutic activities;Cognitive remediation/compensation;Patient/family education    OT Goals(Current goals can be found in the care plan  section) Acute Rehab OT Goals Patient Stated Goal: be able to get better OT Goal Formulation: With patient Potential to Achieve Goals: Good ADL Goals Pt Will Perform Lower Body Bathing: with adaptive equipment;sit to/from stand;with mod assist Pt Will Perform Lower Body Dressing: with mod assist;with adaptive equipment;sit to/from stand Pt Will Transfer to Toilet: with mod assist;stand pivot transfer;bedside commode Pt Will Perform Toileting - Clothing Manipulation and hygiene: with min guard assist;sitting/lateral leans Pt Will Perform Tub/Shower Transfer: with mod assist;Stand pivot transfer;3 in 1;rolling walker Additional ADL Goal #1: Pt will be mod A for bed mobility for precursor for OOB ADLs  OT Frequency: Min 2X/week   Barriers to D/C:            Co-evaluation              End of Session Equipment Utilized During Treatment: Gait belt;Rolling walker  Activity Tolerance: Patient tolerated treatment well Patient left: in chair;with call bell/phone within reach;with family/visitor present   Time: 0947-0962 OT Time Calculation (min): 37 min Charges:  OT General Charges $OT Visit: 1 Procedure OT Evaluation $Initial OT Evaluation Tier I: 1 Procedure OT Treatments $Self Care/Home Management : 8-22 mins G-Codes:    Lin Landsman Dec 07, 2014, 2:28 PM

## 2014-11-18 NOTE — Progress Notes (Signed)
Orthopedic Tech Progress Note Patient Details:  Larry [name] [birth year] [id number]  Ortho Devices Type of Ortho Device: Louretta Parma boot Ortho Device/Splint Location: (B) LE Ortho Device/Splint Interventions: Ordered, Application   [name] 11/18/2014, 5:30 PM

## 2014-11-18 NOTE — Progress Notes (Signed)
[facility]                                                                              Patient Demographics  Larry [name], is a 64 y.o. male, DOB - [birth year], SPQ:[id number]  Admit date - 11/13/2014   Admitting Physician Ivor Costa, MD  Outpatient Primary MD for the patient is [name], MD  LOS - 4   Chief Complaint  Patient presents with  . Altered Mental Status      HPI on 11/14/2014 by Dr. Felisa Bonier is a 64 y.o. 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  The patient's wife, patient has been mildly confused in the past 7 days. He has mild cough and shortness of breath, but no chest pain, fever or chills. Patient complains of abdominal pain. It is located in the right abdomen, constant, mild, radiating to the right shoulder, which has been going on for few weeks after he had a fall. He is constipated, no nausea, vomiting, diarrhea. He does not have symptoms of UTI. Patient done does not seem to have unilateral weakness, vision changes, hearing loss.  In ED, patient was found to have WBC 6.0, temperature normal, no tachycardia, creatinine 2.50, BUN 15, potassium 4.6, lactate of 1.62, INR 2.94. Chest x-ray showed new large right pleural effusion. CT-head showed remote right MCA infarction, unchanged pituitary adenoma, mild generalized atrophy and chronic small vessel disease, no acute findings.   Assessment & Plan   Right Rib Fractures with hemothorax -Secondary to falls -Seen on CT abdomen -Trauma surgery consulted and appreciated, chest tube inserted with output (more serous fluid today) -Coumadin  discontiued  Acute encephalopathy -Improved, Likely secondary to the above -CT head: no acute findings -Currently afebrile, no leukocytosis, however patient started on vanc/zosyn -Xanax and Temazepam held -PT recommended SNF  Essential hypertension -Blood pressure is soft on admission. -continue midodrine  Macrocytic Anemia -Hb 9.0,drop likely secondary to hemothorax/hemorrhage -Baseline Hb 11 -Continue to monitor CBC  Diabetes mellitus, type 2 -Last A1c 7.7 on 06/26/14 -Continue lantus, ISS, CBG monitoring  Depression with anxiety: -hold Xanax due to AMS -continue cymbalta  Atrial Fibrillation -CHADSVASC 5 -Currently rate controlled -Was on coumadin, however, discontinued due to hemothorax and continued right retroperitoneal hemorrhage. -INR 1.48 (Vitamin K given on 11/14/2014) -Continue amiodarone -Discussed discontinuation of coumadin with wife.   -Cardiology consulted and appreciated, agreed with holding coumadin until bleeding stops  Antiphospholipid antibody -coumadin discontinued, discussed risks and benefits with the wife, she would like input from cardiology -Per Wife, patient has not had PE or DVT  Chronic systolic CHF (congestive heart failure) -Echocardiogram on 02/02/14 showed EF 20-25%.  -Currently compensated -Managed by dialysis  ESRD (end stage renal disease) on dialysis (MWFS) -Nephrology consulted and appreciated  Abdominal pain/ Large Right Retroperitoneal hemorrhage/ Elevated transaminases -CT abdomen/pelvis: no new abnormality  -Lipase 20 -AST/ALT improving- continue to monitor  Dyspnea/Cough -Improving, Secondary to  above, continue treatment plan as above  Multiple wounds -Stage 3 left heel pressure ulcer, left dorsal wound, right dorsal wound -Wound care consulted, unna boots placed   Code Status: DNR  Family Communication: None at bedside (seen in dialysis)  Disposition Plan: Admitted. Continues to have chest tube.    Time Spent  in minutes   30 minutes  Procedures  Chest tube inserted  Consults   Trauma surgery Nephrology Cardiology  DVT Prophylaxis  SCDs  Lab Results  Component Value Date   PLT 161 11/18/2014    Medications  Scheduled Meds: . amiodarone  200 mg Oral Daily  . atorvastatin  40 mg Oral Daily  . bisacodyl  5-10 mg Oral QHS  . collagenase   Topical Daily  . darbepoetin (ARANESP) injection - DIALYSIS  60 mcg Intravenous Q Mon-HD  . DULoxetine  60 mg Oral BID  . insulin aspart  0-9 Units Subcutaneous TID WC  . insulin glargine  10 Units Subcutaneous QHS  . levothyroxine  75 mcg Oral QAC breakfast  . lidocaine-EPINEPHrine  30 mL Intradermal Once  . midodrine  10 mg Oral TID WC  . multivitamin  1 tablet Oral QHS  . piperacillin-tazobactam (ZOSYN)  IV  2.25 g Intravenous 3 times per day  . sevelamer carbonate  800 mg Oral TID WC  . sildenafil  80 mg Oral TID  . sodium chloride  3 mL Intravenous Q12H  . traZODone  50 mg Oral QHS  . vancomycin  1,000 mg Intravenous Q M,W,F-HD   Continuous Infusions:   PRN Meds:.sodium chloride, sodium chloride, acetaminophen, ALPRAZolam, alteplase, heparin, lidocaine (PF), lidocaine-prilocaine, oxyCODONE-acetaminophen **AND** oxyCODONE, pentafluoroprop-tetrafluoroeth, polyethylene glycol  Antibiotics    Anti-infectives    Start     Dose/Rate Route Frequency Ordered Stop   11/16/14 1200  vancomycin (VANCOCIN) IVPB 1000 mg/200 mL premix     1,000 mg 200 mL/hr over 60 Minutes Intravenous  Once 11/16/14 1111 11/16/14 1333   11/15/14 1400  piperacillin-tazobactam (ZOSYN) IVPB 2.25 g     2.25 g 100 mL/hr over 30 Minutes Intravenous 3 times per day 11/15/14 0755     11/15/14 1200  vancomycin (VANCOCIN) IVPB 1000 mg/200 mL premix     1,000 mg 200 mL/hr over 60 Minutes Intravenous Every M-W-F (Hemodialysis) 11/15/14 0754     11/15/14 0200  vancomycin (VANCOCIN) IVPB 1000 mg/200 mL premix  Status:  Discontinued     1,000 mg 200 mL/hr over 60 Minutes  Intravenous Every 24 hours 11/14/14 0252 11/15/14 0753   11/14/14 0800  piperacillin-tazobactam (ZOSYN) IVPB 3.375 g  Status:  Discontinued     3.375 g 12.5 mL/hr over 240 Minutes Intravenous 3 times per day 11/14/14 0240 11/15/14 0755   11/14/14 0600  piperacillin-tazobactam (ZOSYN) IVPB 3.375 g  Status:  Discontinued     3.375 g 100 mL/hr over 30 Minutes Intravenous 3 times per day 11/14/14 0233 11/14/14 0239   11/14/14 0130  vancomycin (VANCOCIN) IVPB 1000 mg/200 mL premix     1,000 mg 200 mL/hr over 60 Minutes Intravenous  Once 11/14/14 0126 11/14/14 0259   11/14/14 0115  piperacillin-tazobactam (ZOSYN) IVPB 3.375 g     3.375 g 100 mL/hr over 30 Minutes Intravenous  Once 11/14/14 0109 11/14/14 0159      Subjective:   Larry [name] seen and examined today in dialysis. Complains of occasional cough, nonproductive.  Denies chest pain, abdominal pain, N/V/D/C.    Objective:   Filed Vitals:   11/18/14 0830 11/18/14 0900  11/18/14 0930 11/18/14 1000  BP: 85/54 99/60 95/58  98/57  Pulse: 72 50 73 71  Temp:      TempSrc:      Resp:      Height:      Weight:      SpO2:        Wt Readings from Last 3 Encounters:  11/16/14 95.2 kg (209 lb 14.1 oz)  10/11/14 97.9 kg (215 lb 13.3 oz)  08/24/14 102.4 kg (225 lb 12 oz)     Intake/Output Summary (Last 24 hours) at 11/18/14 1033 Last data filed at 11/17/14 1907  Gross per 24 hour  Intake    800 ml  Output     20 ml  Net    780 ml    Exam  General: Well developed, well nourished, NAD  HEENT: NCAT, mucous membranes moist.   Cardiovascular: S1 S2 auscultated, no murmurs  Respiratory: Clear to ausculation (R chest tube in place)  Abdomen: Soft, nontender, nondistended, + bowel sounds  Extremities: warm dry without cyanosis clubbing, Unna boots in place  Neuro: AAOx2, nonfocal  Data Review   Micro Results Recent Results (from the past 240 hour(s))  Culture, blood (routine x 2)     Status: None (Preliminary result)    Collection Time: 11/13/14 11:48 PM  Result Value Ref Range Status   Specimen Description BLOOD LEFT FOREARM  Final   Special Requests BOTTLES DRAWN AEROBIC ONLY 3CC  Final   Culture NO GROWTH 3 DAYS  Final   Report Status PENDING  Incomplete  Culture, blood (routine x 2)     Status: None (Preliminary result)   Collection Time: 11/14/14 12:05 AM  Result Value Ref Range Status   Specimen Description BLOOD LEFT ARM  Final   Special Requests BOTTLES DRAWN AEROBIC AND ANAEROBIC 5CC  Final   Culture NO GROWTH 3 DAYS  Final   Report Status PENDING  Incomplete  MRSA PCR Screening     Status: None   Collection Time: 11/14/14  3:05 AM  Result Value Ref Range Status   MRSA by PCR NEGATIVE NEGATIVE Final    Comment:        The GeneXpert MRSA Assay (FDA approved for NASAL specimens only), is one component of a comprehensive MRSA colonization surveillance program. It is not intended to diagnose MRSA infection nor to guide or monitor treatment for MRSA infections.   Urine culture     Status: None   Collection Time: 11/15/14  1:16 AM  Result Value Ref Range Status   Specimen Description URINE, RANDOM  Final   Special Requests NONE  Final   Culture NO GROWTH 1 DAY  Final   Report Status 11/16/2014 FINAL  Final    Radiology Reports Ct Abdomen Pelvis Wo Contrast  11/14/2014   ADDENDUM REPORT: 11/14/2014 08:10 ADDENDUM: Study discussed by telephone with Dr. Ree Kida On 11/14/2014 at 0800 hours. Electronically Signed   By: Genevie Ann M.D.   On: 11/14/2014 08:10  11/14/2014   CLINICAL DATA:  64 year old male with constant right side abdominal pain. Retroperitoneal hemorrhage in August. Subsequent encounter.  EXAM: CT ABDOMEN AND PELVIS WITHOUT CONTRAST  TECHNIQUE: Multidetector CT imaging of the abdomen and pelvis was performed following the standard protocol without IV contrast.  COMPARISON:  09/15/2014 and earlier.  FINDINGS: Increased right pleural effusion, now almost completely opacifying the  visible right lung base. On this noncontrast exam there are multiple round and nodular intermediate density foci within the right pleural space, individually  up to 4 cm (series 2, image 19), new since August. The right lower lobe is partially collapsed and its airways partially filled with fluid ( "Drowned lung" ). There are parenchymal calcifications suggesting granulomas.  Cardiomegaly with no pericardial effusion. No left pleural effusion and left lower lobe ventilation has improved.  There are displaced lateral right eighth and ninth rib fractures which are new since August (series 3). These are in proximity to the intermediate density material in the right pleural space. Lower thoracic and lumbar vertebrae appear intact. Bilateral SI joint ankylosis. Degenerative changes at the hips. Proximal left femur orthopedic hardware. No other acute osseous abnormality identified.  Bilateral flank anasarca has increased and now tracks to the pelvis. The large right retroperitoneal hemorrhage has decreased but not resolved, now encompassing 12 x 15.5 x 14 cm (AP by transverse by CC) versus 17 x 19 x 24 cm previously. Layering hematocrit again noted. The right psoas muscle is less abnormal now. Regional mass effect including anterior displacement of the right kidney is stable to mildly regressed.  Interval regressed free fluid in the abdomen.  Stable presacral stranding. Stable distal colon with intermittent retained stool. There is no longer gas in the urinary bladder. Diverticulosis of the sigmoid colon with no definite active inflammation. Mild gaseous distension of the left colon, transverse colon and right colon. Oral contrast has not reached the distal small bowel. No dilated or abnormal small bowel loops. Fat containing umbilical hernia. Negative stomach and duodenum.  The gallbladder is mildly distended, no pericholecystic stranding. Stable noncontrast liver parenchyma, spleen, pancreas, adrenal glands, and kidneys.  Aortoiliac calcified atherosclerosis noted. Stable maximal celiac axis lymph nodes up to 14 mm.  IMPRESSION: 1. Large volume right hemothorax with new displaced right lateral eighth and ninth rib fractures. Complete atelectasis of the visible right lung. 2. Regressed but not resolved large right retroperitoneal hemorrhage since August. Residual hemorrhage volume approximates 1,302 mL. 3. No new abnormality in the abdomen or pelvis.  Electronically Signed: By: Genevie Ann M.D. On: 11/14/2014 07:52   Ct Head Wo Contrast  11/14/2014   CLINICAL DATA:  Altered mental status.  EXAM: CT HEAD WITHOUT CONTRAST  TECHNIQUE: Contiguous axial images were obtained from the base of the skull through the vertex without intravenous contrast.  COMPARISON:  08/25/2014, 02/01/2014  FINDINGS: There is no intracranial hemorrhage or extra-axial fluid collection. There is no evidence of acute infarction. There is encephalomalacia due to remote right posterior MCA infarction. There is mild generalized atrophy. There is mild periventricular hypodensity consistent with chronic small vessel disease. Prominent pituitary soft tissues are consistent with the previously described adenoma. Visible paranasal sinuses are clear.  IMPRESSION: 1. Remote right MCA infarction. 2. Unchanged pituitary adenoma. 3. Mild generalized atrophy and chronic small vessel disease. 4. No acute findings   Electronically Signed   By: Andreas Newport M.D.   On: 11/14/2014 00:53   Dg Chest Port 1 View  11/17/2014   CLINICAL DATA:  Follow-up hemopneumothorax on the right.  EXAM: PORTABLE CHEST 1 VIEW  COMPARISON:  11/16/2014  FINDINGS: Pacemaker AICD appears unchanged. Left chest is clear. Dual lumen catheter on the right has its tip at the SVC RA junction. Right chest tubes in place. I do not see a right pneumothorax. Atelectasis in the right lower lung persists.  IMPRESSION: No pneumothorax visible on the right on today's study. Persistent atelectasis in the right  lower lung.   Electronically Signed   By: Jan Fireman.D.  On: 11/17/2014 08:54   Dg Chest Port 1 View  11/16/2014   CLINICAL DATA:  History of right rib fractures with hemothorax.  EXAM: PORTABLE CHEST 1 VIEW  COMPARISON:  11/15/2014  FINDINGS: Right dual lumen central venous catheter and dual lead AICD are stable. Right chest tube is also noted. Previously seen right apical pneumothorax is stable in size. There is haziness of the lung parenchyma in the right lower lobe, and possibly residual right hemothorax. Left lung is clear.  Cardiomediastinal silhouette is stably enlarged. Mediastinal contours appear intact.  Right-sided rib fractures again noted. Soft tissues are grossly normal.  IMPRESSION: Stable residual right apical pneumothorax.  Right lower lobe airspace opacity which may represent consolidation, or atelectasis. Probable small residual right hemothorax.  Stable enlargement of the cardiac silhouette.   Electronically Signed   By: Fidela Salisbury M.D.   On: 11/16/2014 12:28   Dg Chest Port 1 View  11/15/2014   CLINICAL DATA:  Traumatic hemopneumothorax  EXAM: PORTABLE CHEST 1 VIEW  COMPARISON:  November 14, 2014  FINDINGS: Chest tube is present on the right. There is a minimal right apical pneumothorax. There is persistent pleural effusion with patchy airspace consolidation in the mid and lower lung zones on the right. Left lung is clear except for mild left base atelectasis. Heart is enlarged with pulmonary vascularity within normal limits. Pacemaker leads are attached to the right atrium and right ventricle. Central catheter tip is in the right atrium slightly beyond the cavoatrial junction. There is again noted a displaced right lateral eighth rib fracture.  IMPRESSION: Minimal right apical pneumothorax. Right effusion as well as airspace consolidation in the right mid and lower lung zones is present. Displaced right eighth rib fracture again noted. Left lung clear except for mild left base  atelectasis. Stable cardiomegaly.   Electronically Signed   By: Lowella Grip III M.D.   On: 11/15/2014 08:00   Dg Chest Port 1 View  11/14/2014   CLINICAL DATA:  64 year old male right side chest tube placement for hemothorax. Initial encounter.  EXAM: PORTABLE CHEST 1 VIEW  COMPARISON:  CT Abdomen and Pelvis 0733 hours today, and earlier  FINDINGS: Portable AP view at 1611 hours. New right chest tube placed. Significantly reduced right pleural effusion/hemothorax volume. Improved right lung ventilation. No pneumothorax identified. Displaced right eighth rib fracture re- identified.  Stable cardiomegaly and mediastinal contours. Stable right IJ approach dual lumen dialysis type catheter. Stable left chest cardiac pacemaker. Residual right lung base atelectasis.  IMPRESSION: Regressed right pleural effusion/ hemothorax and improved right lung ventilation status post right chest tube placement. No pneumothorax identified.   Electronically Signed   By: Genevie Ann M.D.   On: 11/14/2014 16:19   Dg Chest Port 1 View  11/13/2014   CLINICAL DATA:  Short of breath  EXAM: PORTABLE CHEST 1 VIEW  COMPARISON:  09/04/2014  FINDINGS: Tunneled right internal jugular dialysis catheter in place. Tip is at the cavoatrial junction. Right PICC is been removed. Left subclavian AICD device and leads are stable. Large right pleural effusion has developed. This obscures the right lower lung.  IMPRESSION: New large right pleural effusion.   Electronically Signed   By: Marybelle Killings M.D.   On: 11/13/2014 23:43    CBC  Recent Labs Lab 11/14/14 0006  11/15/14 0409 11/16/14 1155 11/17/14 0755 11/17/14 2021 11/18/14 0406  WBC 6.0  < > 6.2 6.2 6.3 6.4 5.7  HGB 9.1*  < > 9.5* 9.4* 9.0* 9.4* 8.9*  HCT 29.2*  < > 30.4* 29.9* 29.3* 30.3* 28.9*  PLT 228  < > 210 154 162 162 161  MCV 101.7*  < > 103.1* 102.0* 102.4* 102.0* 101.0*  MCH 31.7  < > 32.2 32.1 31.5 31.6 31.1  MCHC 31.2  < > 31.3 31.4 30.7 31.0 30.8  RDW 19.5*  < >  20.1* 20.0* 20.1* 20.0* 20.0*  LYMPHSABS 0.7  --   --   --   --   --   --   MONOABS 0.9  --   --   --   --   --   --   EOSABS 0.2  --   --   --   --   --   --   BASOSABS 0.1  --   --   --   --   --   --   < > = values in this interval not displayed.  Chemistries   Recent Labs Lab 11/14/14 0006  11/14/14 0329 11/15/14 0409 11/16/14 0815 11/16/14 0816 11/17/14 0755 11/17/14 2021 11/18/14 0406  NA 130*  < > 130* 127* 130* 130* 129* 128* 131*  K 4.7  < > 4.5 4.8 3.7 3.7 3.8 4.6 4.3  CL 93*  < > 93* 91* 96* 96* 93* 93* 96*  CO2 26  --  26 24 26 25 27 23 22   GLUCOSE 124*  < > 132* 114* 103* 101* 102* 115* 88  BUN 12  < > 13 22* 16 17 14 20  22*  CREATININE 2.67*  < > 2.75* 3.78* 3.06* 3.02* 3.37* 3.81* 4.10*  CALCIUM 8.3*  --  8.1* 8.2* 8.0* 8.0* 8.1* 8.0* 8.3*  AST 329*  --  275* 167*  --   --  49*  --   --   ALT 236*  --  214* 175*  --   --  87*  --   --   ALKPHOS 119  --  108 94  --   --  81  --   --   BILITOT 0.8  --  0.9 1.0  --   --  0.8  --   --   < > = values in this interval not displayed. ------------------------------------------------------------------------------------------------------------------ estimated creatinine clearance is 21.6 mL/min (by C-G formula based on Cr of 4.1). ------------------------------------------------------------------------------------------------------------------ No results for input(s): HGBA1C in the last 72 hours. ------------------------------------------------------------------------------------------------------------------ No results for input(s): CHOL, HDL, LDLCALC, TRIG, CHOLHDL, LDLDIRECT in the last 72 hours. ------------------------------------------------------------------------------------------------------------------ No results for input(s): TSH, T4TOTAL, T3FREE, THYROIDAB in the last 72 hours.  Invalid input(s):  FREET3 ------------------------------------------------------------------------------------------------------------------ No results for input(s): VITAMINB12, FOLATE, FERRITIN, TIBC, IRON, RETICCTPCT in the last 72 hours.  Coagulation profile  Recent Labs Lab 11/14/14 1316 11/15/14 0409 11/16/14 0355 11/17/14 0755 11/18/14 0406  INR 2.47* 1.62* 1.41 1.57* 1.48    No results for input(s): DDIMER in the last 72 hours.  Cardiac Enzymes  Recent Labs Lab 11/14/14 0006  TROPONINI 0.06*   ------------------------------------------------------------------------------------------------------------------ Invalid input(s): POCBNP    Lekita Kerekes D.O. on 11/18/2014 at 10:33 AM  Between 7am to 7pm - Pager - [phone]  After 7pm go to [email or web address] - password [email or web address]  And look for the night coverage person covering for me after hours  [facility] Office  [phone]

## 2014-11-18 NOTE — Procedures (Signed)
I was present at this dialysis session. I have reviewed the session itself and made appropriate changes.   Tolerating HD well, mild stable hypotension. TDC.  UF goal 1.5L.  Hb st able.  4K bath with K of 4.3.  Pearson Grippe  MD 11/18/2014, 8:56 AM

## 2014-11-18 NOTE — Clinical Social Work Note (Signed)
CSW received phone call from bedside nurse who stated that family and patient do not want him to return to [facility].  Patient and family said they would like to look at other options and [facility] would be a possiblity.  CSW informed bedside nurse that [place] will meet with patient tomorrow to discuss other options.  CSW to continue to follow patient's progress.  Jones Broom. [place], MSW, [place] 11/18/2014 5:48 PM

## 2014-11-18 NOTE — Progress Notes (Signed)
Physical Therapy Treatment Patient Details Name: Larry [name] MRN: [id number] DOB: [birth year] Today's Date: 11/18/2014    History of Present Illness Larry [name] is a 64 y.o. male admitted from [facility] with encephalopathy and R pleural effusion. He apparently fell 3x while at [facility] and also has 2right rib fx. Pt with recent admit July-Sept after left hip fx s/p IM nail, pacemaker, initiation of HD due to ESRD. PMHx NICM, CAD, DDD, back pain, CVA, obesity.     PT Comments    Patient progressing slowly towards PT goals. Demonstrates weakness and impaired endurance. Requires encouragement to increase ambulation distance. Instructed pt in exercises to perform during the day. Improved ambulation distance today and increased activity tolerance. Motivated to return to PLOF. Will follow acutely.    Follow Up Recommendations  SNF;Supervision/Assistance - 24 hour     Equipment Recommendations  Rolling walker with 5" wheels    Recommendations for Other Services       Precautions / Restrictions Precautions Precautions: Fall Precaution Comments: chest tube to water seal Restrictions Weight Bearing Restrictions: No    Mobility  Bed Mobility Overal bed mobility: Needs Assistance Bed Mobility: Rolling;Sidelying to Sit Rolling: Min assist Sidelying to sit: Mod assist;+2 for physical assistance       General bed mobility comments: Sitting in chair upon PT arrival.  Transfers Overall transfer level: Needs assistance Equipment used: Rolling walker (2 wheeled) Transfers: Sit to/from Stand Sit to Stand: Min assist Stand pivot transfers: Min assist       General transfer comment: MIn A to boost from chair. Stood from chair x2, from [facility] x1. SPT bed to/from [facility] Min A for balance and cues for technique.  Ambulation/Gait Ambulation/Gait assistance: Min assist Ambulation Distance (Feet): 30 Feet (x2 bouts) Assistive device: Rolling walker (2 wheeled) Gait Pattern/deviations:  Step-through pattern;Decreased stride length;Trunk flexed   Gait velocity interpretation: Below normal speed for age/gender General Gait Details: Cues for upright posture and safety. Multiple short standing rest breaks due to fatigue.    Stairs            Wheelchair Mobility    Modified Rankin (Stroke Patients Only)       Balance Overall balance assessment: Needs assistance Sitting-balance support: Feet supported;No upper extremity supported Sitting balance-Leahy Scale: Good Sitting balance - Comments: Worked on upright sitting posture and core engagement.   Standing balance support: During functional activity;Bilateral upper extremity supported Standing balance-Leahy Scale: Fair Standing balance comment: Relient on external support for balance.                     Cognition Arousal/Alertness: Awake/alert Behavior During Therapy: WFL for tasks assessed/performed Overall Cognitive Status: Within Functional Limits for tasks assessed                      Exercises Total Joint Exercises Ankle Circles/Pumps: Both;10 reps;Supine Long Arc Quad: Both;10 reps;Seated Marching in Standing: Both;10 reps;Seated General Exercises - Lower Extremity Ankle Circles/Pumps: Both;10 reps;Seated Long Arc Quad: Both;10 reps;Seated Hip Flexion/Marching: Both;10 reps;Seated    General Comments General comments (skin integrity, edema, etc.): Wife present during session.      Pertinent Vitals/Pain Pain Assessment: Faces Pain Score: 8  Faces Pain Scale: Hurts a little bit Pain Location: back Pain Descriptors / Indicators: Sore;Aching Pain Intervention(s): Monitored during session;Repositioned;Premedicated before session    Home Living Family/patient expects to be discharged to:: Skilled nursing facility Living Arrangements: Spouse/significant other Available Help at Discharge: Family;Available 24 hours/day  Type of Home: House Home Access: Stairs to enter Entrance  Stairs-Rails: None Home Layout: Two level;Bed/bath upstairs;1/2 bath on main level Home Equipment: None Additional Comments: Pt's wife present during session, pt and pt's wife want pt to return back to a SNF    Prior Function Level of Independence: Needs assistance    ADL's / Homemaking Assistance Needed: Pt was recieving assistance from SNF staff for LB dressing and bathing.     PT Goals (current goals can now be found in the care plan section) Acute Rehab PT Goals Patient Stated Goal: be able to get better Progress towards PT goals: Progressing toward goals    Frequency  Min 3X/week    PT Plan Current plan remains appropriate    Co-evaluation             End of Session Equipment Utilized During Treatment: Gait belt Activity Tolerance: Patient tolerated treatment well;Patient limited by fatigue Patient left: in chair;with call bell/phone within reach;with family/visitor present     Time: 1415-1445 PT Time Calculation (min) (ACUTE ONLY): 30 min  Charges:  $Gait Training: 8-22 mins $Therapeutic Activity: 8-22 mins                    G Codes:      Juniper Snyders A Marissa Weaver 11/18/2014, 3:28 PM  Wray Kearns, Russell, DPT [phone]

## 2014-11-18 NOTE — Progress Notes (Signed)
OT Cancellation Note  Patient Details Name: Larry [name] MRN: [id number] DOB: 02-27-1950   Cancelled Treatment:    Reason Eval/Treat Not Completed: Patient at procedure or test/ unavailable (HD currently) OT continue to follow and attempt to evaluate at more appropriate time.  [name] B 11/18/2014, 7:05 AM   Jeri Modena   OTR/L Pager: [phone] Office: [phone] .

## 2014-11-19 ENCOUNTER — Encounter (HOSPITAL_COMMUNITY): Payer: Self-pay | Admitting: Dentistry

## 2014-11-19 ENCOUNTER — Inpatient Hospital Stay (HOSPITAL_COMMUNITY): Payer: Commercial Managed Care - HMO

## 2014-11-19 DIAGNOSIS — J9383 Other pneumothorax: Secondary | ICD-10-CM

## 2014-11-19 LAB — COMPREHENSIVE METABOLIC PANEL WITH GFR
ALT: 52 U/L (ref 17–63)
AST: 30 U/L (ref 15–41)
Albumin: 2.2 g/dL — ABNORMAL LOW (ref 3.5–5.0)
Alkaline Phosphatase: 71 U/L (ref 38–126)
Anion gap: 9 (ref 5–15)
BUN: 16 mg/dL (ref 6–20)
CO2: 26 mmol/L (ref 22–32)
Calcium: 7.9 mg/dL — ABNORMAL LOW (ref 8.9–10.3)
Chloride: 95 mmol/L — ABNORMAL LOW (ref 101–111)
Creatinine, Ser: 3.61 mg/dL — ABNORMAL HIGH (ref 0.61–1.24)
GFR calc Af Amer: 19 mL/min — ABNORMAL LOW
GFR calc non Af Amer: 17 mL/min — ABNORMAL LOW
Glucose, Bld: 99 mg/dL (ref 65–99)
Potassium: 4 mmol/L (ref 3.5–5.1)
Sodium: 130 mmol/L — ABNORMAL LOW (ref 135–145)
Total Bilirubin: 0.7 mg/dL (ref 0.3–1.2)
Total Protein: 6 g/dL — ABNORMAL LOW (ref 6.5–8.1)

## 2014-11-19 LAB — GLUCOSE, CAPILLARY
Glucose-Capillary: 82 mg/dL (ref 65–99)
Glucose-Capillary: 106 mg/dL — ABNORMAL HIGH (ref 65–99)
Glucose-Capillary: 112 mg/dL — ABNORMAL HIGH (ref 65–99)

## 2014-11-19 LAB — CULTURE, BLOOD (ROUTINE X 2)
CULTURE: NO GROWTH
Culture: NO GROWTH

## 2014-11-19 LAB — CBC
HCT: 28.6 % — ABNORMAL LOW (ref 39.0–52.0)
Hemoglobin: 9 g/dL — ABNORMAL LOW (ref 13.0–17.0)
MCH: 32.6 pg (ref 26.0–34.0)
MCHC: 31.5 g/dL (ref 30.0–36.0)
MCV: 103.6 fL — ABNORMAL HIGH (ref 78.0–100.0)
Platelets: 129 K/uL — ABNORMAL LOW (ref 150–400)
RBC: 2.76 MIL/uL — ABNORMAL LOW (ref 4.22–5.81)
RDW: 20.3 % — ABNORMAL HIGH (ref 11.5–15.5)
WBC: 5.7 K/uL (ref 4.0–10.5)

## 2014-11-19 LAB — HEPATITIS B SURFACE ANTIBODY,QUALITATIVE: HEP B S AB: NONREACTIVE

## 2014-11-19 MED ORDER — MORPHINE SULFATE (PF) 4 MG/ML IV SOLN
4.0000 mg | Freq: Once | INTRAVENOUS | Status: AC
  Administered 2014-11-19: 4 mg via INTRAVENOUS
  Filled 2014-11-19: qty 1

## 2014-11-19 NOTE — Progress Notes (Signed)
Subjective:   No complaints, got through HD yesterday without anxiety.   Objective Filed Vitals:   11/18/14 1415 11/18/14 1843 11/18/14 2122 11/19/14 0613  BP: 83/55 84/50 97/55  96/57  Pulse: 72  72 80  Temp: 98.5 F (36.9 C)  98.4 F (36.9 C) 98 F (36.7 C)  TempSrc: Axillary  Oral Oral  Resp: 16  18 18   Height:      Weight:    94.829 kg (209 lb 1 oz)  SpO2: 100%  99% 99%   Physical Exam General:alert and oriented, sitting in chair, no acute distress Heart: RRR 2/6  Lungs: CTA, R chest tube patent - no drainage Abdomen: soft, nontender +BS Extremities: bilat LE edema. Legs wrapped Dialysis Access: R IJ cath. R AVF maturing +b/t  MWFS   4hrs 95kgs 4K/2.25 Ca Cath No heparin P4 micera 50 q 2 weeks- last given9/28  Assessment/Plan: 1. AMS- back to baseline- caution with xanax 2. Rib fractures/ hemothorax- trauma surgery following. Better after chest tube. No heparin in HD, no coumadin 3. ESRD - MWFS, HD tomorrow, K+4 4. 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 Has LE edema, may need lower dry weight, get vol down some more if BP will tolerate.cont midodrine 5. Anemia - on mircera at OP unit, hgb 9/ dose Aranesp this week 6. Metabolic bone disease - cont renvela, last phos 6.7 and PTH 177 7. Nutrition - MVI. Alb 2.2 reports appetite much improved  8. Chronic hypotension on midodrine- SBP in the 90s 9. AFib on amiodarone, coumadin stopped due to bleeding events (recent RP bleed, then hemothorax) and hx of falls- on amiodarone 10. Cellulitis- on Vanc 11. Anxiety- gets VERY anxious end of HD.  12. DNR 13. dispo- to SNF    Shelle Iron, NP [facility] [phone] 11/19/2014,10:12 AM  LOS: 5 days    Additional Objective Labs: Basic Metabolic Panel:  Recent Labs Lab 11/16/14 0815  11/17/14 2021 11/18/14 0406 11/19/14 0423  NA 130*  < > 128* 131* 130*  K 3.7  < > 4.6 4.3 4.0  CL 96*  <  > 93* 96* 95*  CO2 26  < > 23 22 26   GLUCOSE 103*  < > 115* 88 99  BUN 16  < > 20 22* 16  CREATININE 3.06*  < > 3.81* 4.10* 3.61*  CALCIUM 8.0*  < > 8.0* 8.3* 7.9*  PHOS 4.4  --  5.3*  --   --   < > = values in this interval not displayed. Liver Function Tests:  Recent Labs Lab 11/15/14 0409  11/17/14 0755 11/17/14 2021 11/19/14 0423  AST 167*  --  49*  --  30  ALT 175*  --  87*  --  52  ALKPHOS 94  --  81  --  71  BILITOT 1.0  --  0.8  --  0.7  PROT 6.3*  --  6.1*  --  6.0*  ALBUMIN 2.5*  < > 2.3* 2.5* 2.2*  < > = values in this interval not displayed.  Recent Labs Lab 11/14/14 0329  LIPASE 20*   CBC:  Recent Labs Lab 11/14/14 0006  11/16/14 1155 11/17/14 0755 11/17/14 2021 11/18/14 0406 11/19/14 0423  WBC 6.0  < > 6.2 6.3 6.4 5.7 5.7  NEUTROABS 4.1  --   --   --   --   --   --   HGB 9.1*  < > 9.4*  9.0* 9.4* 8.9* 9.0*  HCT 29.2*  < > 29.9* 29.3* 30.3* 28.9* 28.6*  MCV 101.7*  < > 102.0* 102.4* 102.0* 101.0* 103.6*  PLT 228  < > 154 162 162 161 129*  < > = values in this interval not displayed. Blood Culture    Component Value Date/Time   SDES URINE, RANDOM 11/15/2014 0116   SPECREQUEST NONE 11/15/2014 0116   CULT NO GROWTH 1 DAY 11/15/2014 0116   REPTSTATUS 11/16/2014 FINAL 11/15/2014 0116    Cardiac Enzymes:  Recent Labs Lab 11/14/14 0006  TROPONINI 0.06*   CBG:  Recent Labs Lab 11/17/14 1715 11/17/14 2059 11/18/14 1136 11/18/14 1659 11/18/14 2119  GLUCAP 104* 113* 75 101* 132*   Iron Studies: No results for input(s): IRON, TIBC, TRANSFERRIN, FERRITIN in the last 72 hours. @lablastinr3 @ Studies/Results: Dg Chest Port 1 View  11/19/2014   CLINICAL DATA:  Traumatic hemo pneumothorax.  Right chest tube.  EXAM: PORTABLE CHEST 1 VIEW  COMPARISON:  11/27/2014.  FINDINGS: Right chest tube and dual-lumen right IJ catheter in stable position. Small pneumothorax noted on today's exam. Persistent right lower lobe atelectasis. Cardiac pacer stable  position. Cardiomegaly with normal pulmonary vascularity.  IMPRESSION: 1. Right chest tube and dual lumen IJ catheter in stable position. There is a small pneumothorax noted on today's exam. 2. Persistent right base atelectasis. 3. Cardiac pacer stable position. Stable cardiomegaly. No pulmonary venous congestion. Critical Value/emergent results were called by telephone at the time of interpretation on 11/19/2014 at 7:40 am to nurse Alvis Lemmings, who verbally acknowledged these results.   Electronically Signed   By: Gordon   On: 11/19/2014 07:42   Medications:   . amiodarone  200 mg Oral Daily  . atorvastatin  40 mg Oral Daily  . bisacodyl  5-10 mg Oral QHS  . collagenase   Topical Daily  . darbepoetin (ARANESP) injection - DIALYSIS  60 mcg Intravenous Q Mon-HD  . DULoxetine  60 mg Oral BID  . insulin aspart  0-9 Units Subcutaneous TID WC  . insulin glargine  10 Units Subcutaneous QHS  . levothyroxine  75 mcg Oral QAC breakfast  . lidocaine-EPINEPHrine  30 mL Intradermal Once  . midodrine  10 mg Oral TID WC  . multivitamin  1 tablet Oral QHS  . piperacillin-tazobactam (ZOSYN)  IV  2.25 g Intravenous 3 times per day  . sevelamer carbonate  800 mg Oral TID WC  . sildenafil  80 mg Oral TID  . sodium chloride  3 mL Intravenous Q12H  . traZODone  50 mg Oral QHS  . vancomycin  1,000 mg Intravenous Q M,W,F-HD

## 2014-11-19 NOTE — Progress Notes (Signed)
[facility]                                                                              Patient Demographics  Larry [name], is a 64 y.o. male, DOB - 10-06-1950, [id number]  Admit date - 11/13/2014   Admitting Physician Ivor Costa, MD  Outpatient Primary MD for the patient is [name], MD  LOS - 5   Chief Complaint  Patient presents with  . Altered Mental Status      HPI on 11/14/2014 by Dr. Felisa Bonier is a 64 y.o. 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 The patient's wife, patient has been mildly confused in the past 7 days. He has mild cough and shortness of breath, but no chest pain, fever or chills. Patient complains of abdominal pain. It is located in the right abdomen, constant, mild, radiating to the right shoulder, which has been going on for few weeks after he had a fall. He is constipated, no nausea, vomiting, diarrhea. He does not have symptoms of UTI. Patient done does not seem to have unilateral weakness, vision changes, hearing loss. In ED, patient was found to have WBC 6.0, temperature normal, no tachycardia, creatinine 2.50, BUN 15, potassium 4.6, lactate of 1.62, INR 2.94. Chest x-ray showed new large right pleural effusion. CT-head showed remote right MCA infarction, unchanged pituitary adenoma, mild generalized atrophy and chronic small vessel disease, no acute findings.   Interim history Patient found to have right rib fractures with hemothorax. Trauma surgery was consulted and chest tube placed. Patient found to have a small right apical pneumothorax.  Patient will likely need  SNF  Assessment & Plan   Right Rib Fractures with hemothorax -Secondary to falls -Seen on CT abdomen -Trauma surgery consulted and appreciated, chest tube inserted.  Chest tube to water seal -Coumadin discontinued  Spontaneous pneumothorax -CXR: small PTX   Acute encephalopathy -Resolved, Likely secondary to the above -CT head: no acute findings -Currently afebrile, no leukocytosis, however patient started on vanc/zosyn -Xanax and Temazepam held -PT recommended SNF  Essential hypertension -Blood pressure is soft on admission. -continue midodrine  Macrocytic Anemia -Hb 9.0, drop likely secondary to hemothorax/hemorrhage -Baseline Hb 11 -Continue to monitor CBC  Diabetes mellitus, type 2 -Last A1c 7.7 on 06/26/14 -Continue lantus, ISS, CBG monitoring  Depression with anxiety: -hold Xanax due to AMS -continue cymbalta  Atrial Fibrillation -CHADSVASC 5 -Currently rate controlled -Was on coumadin, however, discontinued due to hemothorax and continued right retroperitoneal hemorrhage. -INR 1.48 (Vitamin K given on 11/14/2014) -Continue amiodarone -Discussed discontinuation of coumadin with wife.   -Cardiology consulted and appreciated, agreed with holding coumadin until bleeding stops  Antiphospholipid antibody -coumadin discontinued, discussed risks and benefits with the wife, she would like input from cardiology -Per Wife, patient has not had PE or DVT  Chronic systolic CHF (congestive heart failure) -Echocardiogram on 02/02/14 showed EF 20-25%.  -Currently compensated -Managed by  dialysis  ESRD (end stage renal disease) on dialysis (MWFS) -Nephrology consulted and appreciated  Abdominal pain/ Large Right Retroperitoneal hemorrhage/ Elevated transaminases -CT abdomen/pelvis: no new abnormality  -Lipase 20 -AST/ALT improving- continue to monitor  Dyspnea/Cough -Improving, Secondary to above, continue treatment plan as above  Multiple wounds -Stage 3 left  heel pressure ulcer, left dorsal wound, right dorsal wound -Wound care consulted, unna boots placed   Physical deconditioning -PT consulted and recommended SNF  Code Status: DNR  Family Communication: Wife at bedside.  Disposition Plan: Admitted. Continues to have chest tube.  Will likely need SNF  Time Spent in minutes   30 minutes  Procedures  Chest tube inserted  Consults   Trauma surgery Nephrology Cardiology  DVT Prophylaxis  SCDs  Lab Results  Component Value Date   PLT 129* 11/19/2014    Medications  Scheduled Meds: . amiodarone  200 mg Oral Daily  . atorvastatin  40 mg Oral Daily  . bisacodyl  5-10 mg Oral QHS  . collagenase   Topical Daily  . darbepoetin (ARANESP) injection - DIALYSIS  60 mcg Intravenous Q Mon-HD  . DULoxetine  60 mg Oral BID  . insulin aspart  0-9 Units Subcutaneous TID WC  . insulin glargine  10 Units Subcutaneous QHS  . levothyroxine  75 mcg Oral QAC breakfast  . lidocaine-EPINEPHrine  30 mL Intradermal Once  . midodrine  10 mg Oral TID WC  . multivitamin  1 tablet Oral QHS  . piperacillin-tazobactam (ZOSYN)  IV  2.25 g Intravenous 3 times per day  . sevelamer carbonate  800 mg Oral TID WC  . sildenafil  80 mg Oral TID  . sodium chloride  3 mL Intravenous Q12H  . traZODone  50 mg Oral QHS  . vancomycin  1,000 mg Intravenous Q M,W,F-HD   Continuous Infusions:   PRN Meds:.acetaminophen, ALPRAZolam, oxyCODONE-acetaminophen **AND** oxyCODONE, polyethylene glycol  Antibiotics    Anti-infectives    Start     Dose/Rate Route Frequency Ordered Stop   11/16/14 1200  vancomycin (VANCOCIN) IVPB 1000 mg/200 mL premix     1,000 mg 200 mL/hr over 60 Minutes Intravenous  Once 11/16/14 1111 11/16/14 1333   11/15/14 1400  piperacillin-tazobactam (ZOSYN) IVPB 2.25 g     2.25 g 100 mL/hr over 30 Minutes Intravenous 3 times per day 11/15/14 0755     11/15/14 1200  vancomycin (VANCOCIN) IVPB 1000 mg/200 mL premix     1,000 mg 200 mL/hr over  60 Minutes Intravenous Every M-W-F (Hemodialysis) 11/15/14 0754     11/15/14 0200  vancomycin (VANCOCIN) IVPB 1000 mg/200 mL premix  Status:  Discontinued     1,000 mg 200 mL/hr over 60 Minutes Intravenous Every 24 hours 11/14/14 0252 11/15/14 0753   11/14/14 0800  piperacillin-tazobactam (ZOSYN) IVPB 3.375 g  Status:  Discontinued     3.375 g 12.5 mL/hr over 240 Minutes Intravenous 3 times per day 11/14/14 0240 11/15/14 0755   11/14/14 0600  piperacillin-tazobactam (ZOSYN) IVPB 3.375 g  Status:  Discontinued     3.375 g 100 mL/hr over 30 Minutes Intravenous 3 times per day 11/14/14 0233 11/14/14 0239   11/14/14 0130  vancomycin (VANCOCIN) IVPB 1000 mg/200 mL premix     1,000 mg 200 mL/hr over 60 Minutes Intravenous  Once 11/14/14 0126 11/14/14 0259   11/14/14 0115  piperacillin-tazobactam (ZOSYN) IVPB 3.375 g     3.375 g 100 mL/hr over 30 Minutes Intravenous  Once 11/14/14 0109 11/14/14 0159  Subjective:   [name] seen and examined today.  Patient has no complaints today.  Denies chest pain, shortness of breath, abdominal pain, dizziness or headache.  Objective:   Filed Vitals:   11/18/14 1415 11/18/14 1843 11/18/14 2122 11/19/14 0613  BP: 83/55 84/50 97/55  96/57  Pulse: 72  72 80  Temp: 98.5 F (36.9 C)  98.4 F (36.9 C) 98 F (36.7 C)  TempSrc: Axillary  Oral Oral  Resp: 16  18 18   Height:      Weight:    94.829 kg (209 lb 1 oz)  SpO2: 100%  99% 99%    Wt Readings from Last 3 Encounters:  11/19/14 94.829 kg (209 lb 1 oz)  10/11/14 97.9 kg (215 lb 13.3 oz)  08/24/14 102.4 kg (225 lb 12 oz)     Intake/Output Summary (Last 24 hours) at 11/19/14 1237 Last data filed at 11/19/14 1235  Gross per 24 hour  Intake    710 ml  Output    450 ml  Net    260 ml    Exam  General: Well developed, well nourished, NAD  HEENT: NCAT, mucous membranes moist.   Cardiovascular: S1 S2 auscultated, no murmurs  Respiratory: Clear to ausculation (R chest tube in  place)  Abdomen: Soft, nontender, nondistended, + bowel sounds  Extremities: warm dry without cyanosis clubbing, Unna boots in place  Neuro: AAOx2, nonfocal  Psych: Appropriate  Data Review   Micro Results Recent Results (from the past 240 hour(s))  Culture, blood (routine x 2)     Status: None (Preliminary result)   Collection Time: 11/13/14 11:48 PM  Result Value Ref Range Status   Specimen Description BLOOD LEFT FOREARM  Final   Special Requests BOTTLES DRAWN AEROBIC ONLY 3CC  Final   Culture NO GROWTH 4 DAYS  Final   Report Status PENDING  Incomplete  Culture, blood (routine x 2)     Status: None (Preliminary result)   Collection Time: 11/14/14 12:05 AM  Result Value Ref Range Status   Specimen Description BLOOD LEFT ARM  Final   Special Requests BOTTLES DRAWN AEROBIC AND ANAEROBIC 5CC  Final   Culture NO GROWTH 4 DAYS  Final   Report Status PENDING  Incomplete  MRSA PCR Screening     Status: None   Collection Time: 11/14/14  3:05 AM  Result Value Ref Range Status   MRSA by PCR NEGATIVE NEGATIVE Final    Comment:        The GeneXpert MRSA Assay (FDA approved for NASAL specimens only), is one component of a comprehensive MRSA colonization surveillance program. It is not intended to diagnose MRSA infection nor to guide or monitor treatment for MRSA infections.   Urine culture     Status: None   Collection Time: 11/15/14  1:16 AM  Result Value Ref Range Status   Specimen Description URINE, RANDOM  Final   Special Requests NONE  Final   Culture NO GROWTH 1 DAY  Final   Report Status 11/16/2014 FINAL  Final    Radiology Reports Ct Abdomen Pelvis Wo Contrast  11/14/2014   ADDENDUM REPORT: 11/14/2014 08:10 ADDENDUM: Study discussed by telephone with Dr. Ree Kida On 11/14/2014 at 0800 hours. Electronically Signed   By: Genevie Ann M.D.   On: 11/14/2014 08:10  11/14/2014   CLINICAL DATA:  64 year old male with constant right side abdominal pain. Retroperitoneal hemorrhage  in August. Subsequent encounter.  EXAM: CT ABDOMEN AND PELVIS WITHOUT CONTRAST  TECHNIQUE: Multidetector CT  imaging of the abdomen and pelvis was performed following the standard protocol without IV contrast.  COMPARISON:  09/15/2014 and earlier.  FINDINGS: Increased right pleural effusion, now almost completely opacifying the visible right lung base. On this noncontrast exam there are multiple round and nodular intermediate density foci within the right pleural space, individually up to 4 cm (series 2, image 19), new since August. The right lower lobe is partially collapsed and its airways partially filled with fluid ( "Drowned lung" ). There are parenchymal calcifications suggesting granulomas.  Cardiomegaly with no pericardial effusion. No left pleural effusion and left lower lobe ventilation has improved.  There are displaced lateral right eighth and ninth rib fractures which are new since August (series 3). These are in proximity to the intermediate density material in the right pleural space. Lower thoracic and lumbar vertebrae appear intact. Bilateral SI joint ankylosis. Degenerative changes at the hips. Proximal left femur orthopedic hardware. No other acute osseous abnormality identified.  Bilateral flank anasarca has increased and now tracks to the pelvis. The large right retroperitoneal hemorrhage has decreased but not resolved, now encompassing 12 x 15.5 x 14 cm (AP by transverse by CC) versus 17 x 19 x 24 cm previously. Layering hematocrit again noted. The right psoas muscle is less abnormal now. Regional mass effect including anterior displacement of the right kidney is stable to mildly regressed.  Interval regressed free fluid in the abdomen.  Stable presacral stranding. Stable distal colon with intermittent retained stool. There is no longer gas in the urinary bladder. Diverticulosis of the sigmoid colon with no definite active inflammation. Mild gaseous distension of the left colon, transverse colon  and right colon. Oral contrast has not reached the distal small bowel. No dilated or abnormal small bowel loops. Fat containing umbilical hernia. Negative stomach and duodenum.  The gallbladder is mildly distended, no pericholecystic stranding. Stable noncontrast liver parenchyma, spleen, pancreas, adrenal glands, and kidneys. Aortoiliac calcified atherosclerosis noted. Stable maximal celiac axis lymph nodes up to 14 mm.  IMPRESSION: 1. Large volume right hemothorax with new displaced right lateral eighth and ninth rib fractures. Complete atelectasis of the visible right lung. 2. Regressed but not resolved large right retroperitoneal hemorrhage since August. Residual hemorrhage volume approximates 1,302 mL. 3. No new abnormality in the abdomen or pelvis.  Electronically Signed: By: Genevie Ann M.D. On: 11/14/2014 07:52   Ct Head Wo Contrast  11/14/2014   CLINICAL DATA:  Altered mental status.  EXAM: CT HEAD WITHOUT CONTRAST  TECHNIQUE: Contiguous axial images were obtained from the base of the skull through the vertex without intravenous contrast.  COMPARISON:  08/25/2014, 02/01/2014  FINDINGS: There is no intracranial hemorrhage or extra-axial fluid collection. There is no evidence of acute infarction. There is encephalomalacia due to remote right posterior MCA infarction. There is mild generalized atrophy. There is mild periventricular hypodensity consistent with chronic small vessel disease. Prominent pituitary soft tissues are consistent with the previously described adenoma. Visible paranasal sinuses are clear.  IMPRESSION: 1. Remote right MCA infarction. 2. Unchanged pituitary adenoma. 3. Mild generalized atrophy and chronic small vessel disease. 4. No acute findings   Electronically Signed   By: Andreas Newport M.D.   On: 11/14/2014 00:53   Dg Chest Port 1 View  11/19/2014   CLINICAL DATA:  Traumatic hemo pneumothorax.  Right chest tube.  EXAM: PORTABLE CHEST 1 VIEW  COMPARISON:  11/27/2014.  FINDINGS:  Right chest tube and dual-lumen right IJ catheter in stable position. Small pneumothorax noted on  today's exam. Persistent right lower lobe atelectasis. Cardiac pacer stable position. Cardiomegaly with normal pulmonary vascularity.  IMPRESSION: 1. Right chest tube and dual lumen IJ catheter in stable position. There is a small pneumothorax noted on today's exam. 2. Persistent right base atelectasis. 3. Cardiac pacer stable position. Stable cardiomegaly. No pulmonary venous congestion. Critical Value/emergent results were called by telephone at the time of interpretation on 11/19/2014 at 7:40 am to nurse Alvis Lemmings, who verbally acknowledged these results.   Electronically Signed   By: Marcello Moores  Register   On: 11/19/2014 07:42   Dg Chest Port 1 View  11/17/2014   CLINICAL DATA:  Follow-up hemopneumothorax on the right.  EXAM: PORTABLE CHEST 1 VIEW  COMPARISON:  11/16/2014  FINDINGS: Pacemaker AICD appears unchanged. Left chest is clear. Dual lumen catheter on the right has its tip at the SVC RA junction. Right chest tubes in place. I do not see a right pneumothorax. Atelectasis in the right lower lung persists.  IMPRESSION: No pneumothorax visible on the right on today's study. Persistent atelectasis in the right lower lung.   Electronically Signed   By: Nelson Chimes M.D.   On: 11/17/2014 08:54   Dg Chest Port 1 View  11/16/2014   CLINICAL DATA:  History of right rib fractures with hemothorax.  EXAM: PORTABLE CHEST 1 VIEW  COMPARISON:  11/15/2014  FINDINGS: Right dual lumen central venous catheter and dual lead AICD are stable. Right chest tube is also noted. Previously seen right apical pneumothorax is stable in size. There is haziness of the lung parenchyma in the right lower lobe, and possibly residual right hemothorax. Left lung is clear.  Cardiomediastinal silhouette is stably enlarged. Mediastinal contours appear intact.  Right-sided rib fractures again noted. Soft tissues are grossly normal.  IMPRESSION: Stable  residual right apical pneumothorax.  Right lower lobe airspace opacity which may represent consolidation, or atelectasis. Probable small residual right hemothorax.  Stable enlargement of the cardiac silhouette.   Electronically Signed   By: Fidela Salisbury M.D.   On: 11/16/2014 12:28   Dg Chest Port 1 View  11/15/2014   CLINICAL DATA:  Traumatic hemopneumothorax  EXAM: PORTABLE CHEST 1 VIEW  COMPARISON:  November 14, 2014  FINDINGS: Chest tube is present on the right. There is a minimal right apical pneumothorax. There is persistent pleural effusion with patchy airspace consolidation in the mid and lower lung zones on the right. Left lung is clear except for mild left base atelectasis. Heart is enlarged with pulmonary vascularity within normal limits. Pacemaker leads are attached to the right atrium and right ventricle. Central catheter tip is in the right atrium slightly beyond the cavoatrial junction. There is again noted a displaced right lateral eighth rib fracture.  IMPRESSION: Minimal right apical pneumothorax. Right effusion as well as airspace consolidation in the right mid and lower lung zones is present. Displaced right eighth rib fracture again noted. Left lung clear except for mild left base atelectasis. Stable cardiomegaly.   Electronically Signed   By: Lowella Grip III M.D.   On: 11/15/2014 08:00   Dg Chest Port 1 View  11/14/2014   CLINICAL DATA:  64 year old male right side chest tube placement for hemothorax. Initial encounter.  EXAM: PORTABLE CHEST 1 VIEW  COMPARISON:  CT Abdomen and Pelvis 0733 hours today, and earlier  FINDINGS: Portable AP view at 1611 hours. New right chest tube placed. Significantly reduced right pleural effusion/hemothorax volume. Improved right lung ventilation. No pneumothorax identified. Displaced right eighth rib fracture  re- identified.  Stable cardiomegaly and mediastinal contours. Stable right IJ approach dual lumen dialysis type catheter. Stable left  chest cardiac pacemaker. Residual right lung base atelectasis.  IMPRESSION: Regressed right pleural effusion/ hemothorax and improved right lung ventilation status post right chest tube placement. No pneumothorax identified.   Electronically Signed   By: Genevie Ann M.D.   On: 11/14/2014 16:19   Dg Chest Port 1 View  11/13/2014   CLINICAL DATA:  Short of breath  EXAM: PORTABLE CHEST 1 VIEW  COMPARISON:  09/04/2014  FINDINGS: Tunneled right internal jugular dialysis catheter in place. Tip is at the cavoatrial junction. Right PICC is been removed. Left subclavian AICD device and leads are stable. Large right pleural effusion has developed. This obscures the right lower lung.  IMPRESSION: New large right pleural effusion.   Electronically Signed   By: Marybelle Killings M.D.   On: 11/13/2014 23:43    CBC  Recent Labs Lab 11/14/14 0006  11/16/14 1155 11/17/14 0755 11/17/14 2021 11/18/14 0406 11/19/14 0423  WBC 6.0  < > 6.2 6.3 6.4 5.7 5.7  HGB 9.1*  < > 9.4* 9.0* 9.4* 8.9* 9.0*  HCT 29.2*  < > 29.9* 29.3* 30.3* 28.9* 28.6*  PLT 228  < > 154 162 162 161 129*  MCV 101.7*  < > 102.0* 102.4* 102.0* 101.0* 103.6*  MCH 31.7  < > 32.1 31.5 31.6 31.1 32.6  MCHC 31.2  < > 31.4 30.7 31.0 30.8 31.5  RDW 19.5*  < > 20.0* 20.1* 20.0* 20.0* 20.3*  LYMPHSABS 0.7  --   --   --   --   --   --   MONOABS 0.9  --   --   --   --   --   --   EOSABS 0.2  --   --   --   --   --   --   BASOSABS 0.1  --   --   --   --   --   --   < > = values in this interval not displayed.  Chemistries   Recent Labs Lab 11/14/14 0006  11/14/14 0329 11/15/14 0409  11/16/14 0816 11/17/14 0755 11/17/14 2021 11/18/14 0406 11/19/14 0423  NA 130*  < > 130* 127*  < > 130* 129* 128* 131* 130*  K 4.7  < > 4.5 4.8  < > 3.7 3.8 4.6 4.3 4.0  CL 93*  < > 93* 91*  < > 96* 93* 93* 96* 95*  CO2 26  --  26 24  < > 25 27 23 22 26   GLUCOSE 124*  < > 132* 114*  < > 101* 102* 115* 88 99  BUN 12  < > 13 22*  < > 17 14 20  22* 16  CREATININE 2.67*   < > 2.75* 3.78*  < > 3.02* 3.37* 3.81* 4.10* 3.61*  CALCIUM 8.3*  --  8.1* 8.2*  < > 8.0* 8.1* 8.0* 8.3* 7.9*  AST 329*  --  275* 167*  --   --  49*  --   --  30  ALT 236*  --  214* 175*  --   --  87*  --   --  52  ALKPHOS 119  --  108 94  --   --  81  --   --  71  BILITOT 0.8  --  0.9 1.0  --   --  0.8  --   --  0.7  < > = values in this interval not displayed. ------------------------------------------------------------------------------------------------------------------ estimated creatinine clearance is 24.5 mL/min (by C-G formula based on Cr of 3.61). ------------------------------------------------------------------------------------------------------------------ No results for input(s): HGBA1C in the last 72 hours. ------------------------------------------------------------------------------------------------------------------ No results for input(s): CHOL, HDL, LDLCALC, TRIG, CHOLHDL, LDLDIRECT in the last 72 hours. ------------------------------------------------------------------------------------------------------------------ No results for input(s): TSH, T4TOTAL, T3FREE, THYROIDAB in the last 72 hours.  Invalid input(s): FREET3 ------------------------------------------------------------------------------------------------------------------ No results for input(s): VITAMINB12, FOLATE, FERRITIN, TIBC, IRON, RETICCTPCT in the last 72 hours.  Coagulation profile  Recent Labs Lab 11/14/14 1316 11/15/14 0409 11/16/14 0355 11/17/14 0755 11/18/14 0406  INR 2.47* 1.62* 1.41 1.57* 1.48    No results for input(s): DDIMER in the last 72 hours.  Cardiac Enzymes  Recent Labs Lab 11/14/14 0006  TROPONINI 0.06*   ------------------------------------------------------------------------------------------------------------------ Invalid input(s): POCBNP    Sheila Ocasio D.O. on 11/19/2014 at 12:37 PM  Between 7am to 7pm - Pager - [phone]  After 7pm go to  [email or web address] - password [email or web address]  And look for the night coverage person covering for me after hours  [facility] Office  [phone]

## 2014-11-19 NOTE — Clinical Social Work Note (Signed)
CSW met with patient and his wife to discuss other SNF options.  Patient and his wife are interested in [facility] or [facility], [place] explained to them that the facilities may not have beds available but CSW will begin SNF placement search.  Patient gave permission for CSW to fax out information to [facility], [facility] and [facility]NR on patient's chart waiting for signature by physician.  Will continue to follow patient towards discharge planning.  Jones Broom. [place], MSW, [place] 11/19/2014 6:29 PM

## 2014-11-19 NOTE — Progress Notes (Signed)
[facility] Progress Note     Subjective: 64 y/o caucasian male with PMH of CHF and ESRD, on trauma service for a multiple fxs of right ribs with hemothorax after a fall. Pt CT in place, on water seal today from vac at 30CM yesterday. CXR today revealed the chest tube to be in place but a new right apical PTX has developed.  On examining the pt today he feels his pain is well controlled, but he feels as if the chest tube is "stabbing him". This resolved after coiling the tubing in his lap to decrease the tension on his sutures. Pt repots his pleura vac was changed last night. His output is currently <5 mL of serosanguinous drainage with no air leak noted. Pt has no complaints today, is hopeful for removal of his chest tube today. He denies palpitations, SOB, and abdominal pain.   Objective: Vital signs in last 24 hours: Temp:  [98 F (36.7 C)-98.5 F (36.9 C)] 98 F (36.7 C) (10/11 0613) Pulse Rate:  [72-80] 80 (10/11 0613) Resp:  [13-18] 18 (10/11 0613) BP: (83-97)/(50-59) 96/57 mmHg (10/11 0613) SpO2:  [96 %-100 %] 99 % (10/11 0613) Weight:  [94.7 kg (208 lb 12.4 oz)-94.829 kg (209 lb 1 oz)] 94.829 kg (209 lb 1 oz) (10/11 5993) Last BM Date: 11/18/14  Intake/Output from previous day: 10/10 0701 - 10/11 0700 In: 450 [IV Piggyback:450] Out: 1950 [Chest Tube:450] Intake/Output this shift: Total I/O In: 100 [P.O.:100] Out: -   PE: Gen:  Alert, NAD, pleasant Pulm: chest tube in R 5-6 ICS with little serosanguinous output (< 94mL) and no air leak. No redness or drainage at tube site. CTABL. Abd: Soft, NT/ND, +BS, no HSM Ext:  No erythema, edema, or tenderness  Lab Results:   Recent Labs  11/18/14 0406 11/19/14 0423  WBC 5.7 5.7  HGB 8.9* 9.0*  HCT 28.9* 28.6*  PLT 161 129*   BMET  Recent Labs  11/18/14 0406 11/19/14 0423  NA 131* 130*  K 4.3 4.0  CL 96* 95*  CO2 22 26  GLUCOSE 88 99  BUN 22* 16  CREATININE 4.10* 3.61*  CALCIUM 8.3* 7.9*    PT/INR  Recent Labs  11/17/14 0755 11/18/14 0406  LABPROT 18.8* 18.0*  INR 1.57* 1.48   CMP     Component Value Date/Time   NA 130* 11/19/2014 0423   K 4.0 11/19/2014 0423   CL 95* 11/19/2014 0423   CO2 26 11/19/2014 0423   GLUCOSE 99 11/19/2014 0423   BUN 16 11/19/2014 0423   CREATININE 3.61* 11/19/2014 0423   CALCIUM 7.9* 11/19/2014 0423   CALCIUM 7.9* 09/20/2014 1315   PROT 6.0* 11/19/2014 0423   ALBUMIN 2.2* 11/19/2014 0423   AST 30 11/19/2014 0423   ALT 52 11/19/2014 0423   ALKPHOS 71 11/19/2014 0423   BILITOT 0.7 11/19/2014 0423   GFRNONAA 17* 11/19/2014 0423   GFRAA 19* 11/19/2014 0423   Lipase     Component Value Date/Time   LIPASE 20* 11/14/2014 0329   Studies/Results: Dg Chest Port 1 View  11/19/2014   CLINICAL DATA:  Traumatic hemo pneumothorax.  Right chest tube.  EXAM: PORTABLE CHEST 1 VIEW  COMPARISON:  11/27/2014.  FINDINGS: Right chest tube and dual-lumen right IJ catheter in stable position. Small pneumothorax noted on today's exam. Persistent right lower lobe atelectasis. Cardiac pacer stable position. Cardiomegaly with normal pulmonary vascularity.  IMPRESSION: 1. Right chest tube and dual lumen IJ catheter in stable position. There  is a small pneumothorax noted on today's exam. 2. Persistent right base atelectasis. 3. Cardiac pacer stable position. Stable cardiomegaly. No pulmonary venous congestion. Critical Value/emergent results were called by telephone at the time of interpretation on 11/19/2014 at 7:40 am to nurse Alvis Lemmings, who verbally acknowledged these results.   Electronically Signed   By: Marcello Moores  Register   On: 11/19/2014 07:42    Anti-infectives: Anti-infectives    Start     Dose/Rate Route Frequency Ordered Stop   11/16/14 1200  vancomycin (VANCOCIN) IVPB 1000 mg/200 mL premix     1,000 mg 200 mL/hr over 60 Minutes Intravenous  Once 11/16/14 1111 11/16/14 1333   11/15/14 1400  piperacillin-tazobactam (ZOSYN) IVPB 2.25 g     2.25 g 100  mL/hr over 30 Minutes Intravenous 3 times per day 11/15/14 0755     11/15/14 1200  vancomycin (VANCOCIN) IVPB 1000 mg/200 mL premix     1,000 mg 200 mL/hr over 60 Minutes Intravenous Every M-W-F (Hemodialysis) 11/15/14 0754     11/15/14 0200  vancomycin (VANCOCIN) IVPB 1000 mg/200 mL premix  Status:  Discontinued     1,000 mg 200 mL/hr over 60 Minutes Intravenous Every 24 hours 11/14/14 0252 11/15/14 0753   11/14/14 0800  piperacillin-tazobactam (ZOSYN) IVPB 3.375 g  Status:  Discontinued     3.375 g 12.5 mL/hr over 240 Minutes Intravenous 3 times per day 11/14/14 0240 11/15/14 0755   11/14/14 0600  piperacillin-tazobactam (ZOSYN) IVPB 3.375 g  Status:  Discontinued     3.375 g 100 mL/hr over 30 Minutes Intravenous 3 times per day 11/14/14 0233 11/14/14 0239   11/14/14 0130  vancomycin (VANCOCIN) IVPB 1000 mg/200 mL premix     1,000 mg 200 mL/hr over 60 Minutes Intravenous  Once 11/14/14 0126 11/14/14 0259   11/14/14 0115  piperacillin-tazobactam (ZOSYN) IVPB 3.375 g     3.375 g 100 mL/hr over 30 Minutes Intravenous  Once 11/14/14 0109 11/14/14 0159       Assessment/Plan Fall Right rib fxs w/HPTX -- CT on water seal. Development of R apical PTX, no signs of air leak. pull CT today. Repeat CXR tomorrow AM.  Multiple medical problems -- per primary service   LOS: 5 days    Mila Palmer 11/19/2014, 10:33 AM Pager: [phone]

## 2014-11-20 ENCOUNTER — Inpatient Hospital Stay (HOSPITAL_COMMUNITY): Payer: Commercial Managed Care - HMO

## 2014-11-20 ENCOUNTER — Other Ambulatory Visit: Payer: Self-pay | Admitting: Internal Medicine

## 2014-11-20 LAB — DIFFERENTIAL
BASOS ABS: 0.1 10*3/uL (ref 0.0–0.1)
Basophils Relative: 2 %
EOS ABS: 0.6 10*3/uL (ref 0.0–0.7)
Eosinophils Relative: 9 %
LYMPHS ABS: 0.8 10*3/uL (ref 0.7–4.0)
LYMPHS PCT: 13 %
MONOS PCT: 14 %
Monocytes Absolute: 0.9 10*3/uL (ref 0.1–1.0)
NEUTROS ABS: 4.1 10*3/uL (ref 1.7–7.7)
NEUTROS PCT: 62 %

## 2014-11-20 LAB — CBC
HCT: 28.5 % — ABNORMAL LOW (ref 39.0–52.0)
Hemoglobin: 9 g/dL — ABNORMAL LOW (ref 13.0–17.0)
MCH: 32.4 pg (ref 26.0–34.0)
MCHC: 31.6 g/dL (ref 30.0–36.0)
MCV: 102.5 fL — ABNORMAL HIGH (ref 78.0–100.0)
PLATELETS: 140 10*3/uL — AB (ref 150–400)
RBC: 2.78 MIL/uL — ABNORMAL LOW (ref 4.22–5.81)
RDW: 20 % — AB (ref 11.5–15.5)
WBC: 6.6 10*3/uL (ref 4.0–10.5)

## 2014-11-20 LAB — BASIC METABOLIC PANEL
ANION GAP: 15 (ref 5–15)
BUN: 24 mg/dL — ABNORMAL HIGH (ref 6–20)
CALCIUM: 8.6 mg/dL — AB (ref 8.9–10.3)
CO2: 22 mmol/L (ref 22–32)
Chloride: 93 mmol/L — ABNORMAL LOW (ref 101–111)
Creatinine, Ser: 4.34 mg/dL — ABNORMAL HIGH (ref 0.61–1.24)
GFR, EST AFRICAN AMERICAN: 15 mL/min — AB (ref >60)
GFR, EST NON AFRICAN AMERICAN: 13 mL/min — AB (ref >60)
Glucose, Bld: 103 mg/dL — ABNORMAL HIGH (ref 65–99)
Potassium: 4 mmol/L (ref 3.5–5.1)
Sodium: 130 mmol/L — ABNORMAL LOW (ref 135–145)

## 2014-11-20 LAB — RENAL FUNCTION PANEL
Albumin: 2 g/dL — ABNORMAL LOW (ref 3.5–5.0)
Anion gap: 14 (ref 5–15)
BUN: 23 mg/dL — ABNORMAL HIGH (ref 6–20)
CHLORIDE: 94 mmol/L — AB (ref 101–111)
CO2: 23 mmol/L (ref 22–32)
CREATININE: 4.42 mg/dL — AB (ref 0.61–1.24)
Calcium: 8.2 mg/dL — ABNORMAL LOW (ref 8.9–10.3)
GFR, EST AFRICAN AMERICAN: 15 mL/min — AB (ref >60)
GFR, EST NON AFRICAN AMERICAN: 13 mL/min — AB (ref >60)
Glucose, Bld: 127 mg/dL — ABNORMAL HIGH (ref 65–99)
POTASSIUM: 4 mmol/L (ref 3.5–5.1)
Phosphorus: 5.7 mg/dL — ABNORMAL HIGH (ref 2.5–4.6)
Sodium: 131 mmol/L — ABNORMAL LOW (ref 135–145)

## 2014-11-20 LAB — GLUCOSE, CAPILLARY
GLUCOSE-CAPILLARY: 100 mg/dL — AB (ref 65–99)
Glucose-Capillary: 81 mg/dL (ref 65–99)

## 2014-11-20 LAB — VANCOMYCIN, RANDOM: VANCOMYCIN RM: 23 ug/mL

## 2014-11-20 MED ORDER — MIDODRINE HCL 5 MG PO TABS
ORAL_TABLET | ORAL | Status: AC
  Filled 2014-11-20: qty 2

## 2014-11-20 NOTE — Progress Notes (Signed)
Patient ID: Larry [name], male   DOB: May 05, 1950, 64 y.o.   MRN: [id number]   LOS: 6 days   Subjective: Denies SOB   Objective: Vital signs in last 24 hours: Temp:  [97.5 F (36.4 C)-97.8 F (36.6 C)] 97.8 F (36.6 C) (10/12 0737) Pulse Rate:  [73-81] 75 (10/12 0751) Resp:  [17-18] 18 (10/12 0737) BP: (83-109)/(55-59) 100/58 mmHg (10/12 0751) SpO2:  [97 %-99 %] 99 % (10/12 0737) Weight:  [97.3 kg (214 lb 8.1 oz)-99.519 kg (219 lb 6.4 oz)] 97.3 kg (214 lb 8.1 oz) (10/12 0737) Last BM Date: 11/19/14   Laboratory  CBC  Recent Labs  11/19/14 0423 11/20/14 0540  WBC 5.7 6.6  HGB 9.0* 9.0*  HCT 28.6* 28.5*  PLT 129* 140*   BMET  Recent Labs  11/20/14 0540 11/20/14 0810  NA 130* 131*  K 4.0 4.0  CL 93* 94*  CO2 22 23  GLUCOSE 103* 127*  BUN 24* 23*  CREATININE 4.34* 4.42*  CALCIUM 8.6* 8.2*    Radiology Results PORTABLE CHEST 1 VIEW  COMPARISON: 11/19/2014.  FINDINGS: Interim removal of right chest tube. Dual-lumen right IJ catheter stable position. Cardiac pacer stable position . Cardiomegaly. No pulmonary venous congestion. Right lower lobe infiltrate and right pleural effusion. No pneumothorax noted on today's exam.  IMPRESSION: 1. Interim removal of right chest tube. No pneumothorax noted on today's exam. Right IJ line in stable position. 2. Cardiac pacer stable position. Stable cardiomegaly. 3. Developing right lower lobe infiltrate and right pleural effusion.   Electronically Signed  By: Marcello Moores Register  On: 11/20/2014 07:47   Physical Exam General appearance: alert and no distress Resp: clear to auscultation bilaterally Cardio: regular rate and rhythm GI: normal findings: bowel sounds normal and soft, non-tender   Assessment/Plan: Fall Right rib fxs w/HPTX -- CXR looks good, trauma will sign off. Please call with questions. Multiple medical problems -- per primary service    Lisette Abu, PA-C Pager:  [phone] General Trauma PA Pager: [phone]  11/20/2014

## 2014-11-20 NOTE — Clinical Social Work Note (Signed)
CSW spoke to patient and his wife they were given bed offers, they are trying to decide where they would like him to go.  They requested information be faxed to [facility] and back to [facility] and are going to consider given it another try.  CSW waiting on response from patient and family about where they would like him to go to.  CSW to continue to follow patient's progress throughout discharge planning.  Jones Broom. [place], MSW, [place] 11/20/2014 6:44 PM

## 2014-11-20 NOTE — Progress Notes (Signed)
[facility]                                                                              Patient Demographics  Larry [name], is a 64 y.o. male, DOB - [birth year], JAS:[id number]  Admit date - 11/13/2014   Admitting Physician Ivor Costa, MD  Outpatient Primary MD for the patient is [name], MD  LOS - 6   Chief Complaint  Patient presents with  . Altered Mental Status      HPI on 11/14/2014 by Dr. Felisa Bonier is a 64 y.o. 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 The patient's wife, patient has been mildly confused in the past 7 days. He has mild cough and shortness of breath, but no chest pain, fever or chills. Patient complains of abdominal pain. It is located in the right abdomen, constant, mild, radiating to the right shoulder, which has been going on for few weeks after he had a fall. He is constipated, no nausea, vomiting, diarrhea. He does not have symptoms of UTI. Patient done does not seem to have unilateral weakness, vision changes, hearing loss. In ED, patient was found to have WBC 6.0, temperature normal, no tachycardia, creatinine 2.50, BUN 15, potassium 4.6, lactate of 1.62, INR 2.94. Chest x-ray showed new large right pleural effusion. CT-head showed remote right MCA infarction, unchanged pituitary adenoma, mild generalized atrophy and chronic small vessel disease, no acute findings.   Interim history Patient found to have right rib fractures with hemothorax. Trauma surgery was consulted and chest tube placed. Patient found to have a small right apical pneumothorax.  PT has recommended SNF on  discharge  Assessment & Plan   Right Rib Fractures with hemothorax -Secondary to falls -Seen on CT abdomen -Trauma surgery consulted and appreciated, chest tube inserted.  Chest tube to water seal -Coumadin discontinued - PT on board and recommending SNF  Spontaneous pneumothorax -CXR: small PTX   Acute encephalopathy -Resolved, Likely secondary to the above -CT head: Reported no acute findings -Currently afebrile, no leukocytosis, however patient started on vanc/zosyn, will consider narrowing antibiotic regimen -Xanax and Temazepam held -PT recommended SNF  Essential hypertension -Blood pressure is soft on admission. -continue midodrine  Macrocytic Anemia -Hb 9.0, drop likely secondary to hemothorax/hemorrhage -Baseline Hb 11 -Continue to monitor CBC  Diabetes mellitus, type 2 -Last A1c 7.7 on 06/26/14 -Continue lantus, ISS, CBG monitoring  Depression with anxiety: -hold Xanax due to AMS -continue cymbalta  Atrial Fibrillation -CHADSVASC 5 -Currently rate controlled -Was on coumadin, however, discontinued due to hemothorax and continued right retroperitoneal hemorrhage. -INR 1.48 (Vitamin K given on 11/14/2014) -Continue amiodarone -Cardiology consulted and appreciated, agreed with holding coumadin until bleeding stops  Antiphospholipid antibody -coumadin discontinued, discussed risks and benefits with the wife, she would like input from cardiology -Per Wife, patient has not had PE or DVT  Chronic systolic CHF (congestive heart failure) -Echocardiogram on 02/02/14 showed EF  20-25%.  -Currently compensated -Managed by dialysis  ESRD (end stage renal disease) on dialysis (MWFS) -Nephrology consulted and appreciated  Abdominal pain/ Large Right Retroperitoneal hemorrhage/ Elevated transaminases -CT abdomen/pelvis: no new abnormality  -Lipase 20 -AST/ALT improving- continue to monitor  Dyspnea/Cough -Improving, Secondary to above, continue treatment plan as  above  Multiple wounds -Stage 3 left heel pressure ulcer, left dorsal wound, right dorsal wound -Wound care consulted, unna boots placed   Physical deconditioning -PT consulted and recommended SNF  Code Status: DNR  Family Communication: Wife at bedside.  Disposition Plan:  SNF once ready for d/c  Time Spent in minutes   30 minutes  Procedures  Chest tube inserted  Consults   Trauma surgery Nephrology Cardiology  DVT Prophylaxis  SCDs  Lab Results  Component Value Date   PLT 140* 11/20/2014    Medications  Scheduled Meds: . amiodarone  200 mg Oral Daily  . atorvastatin  40 mg Oral Daily  . bisacodyl  5-10 mg Oral QHS  . collagenase   Topical Daily  . darbepoetin (ARANESP) injection - DIALYSIS  60 mcg Intravenous Q Mon-HD  . DULoxetine  60 mg Oral BID  . insulin aspart  0-9 Units Subcutaneous TID WC  . insulin glargine  10 Units Subcutaneous QHS  . levothyroxine  75 mcg Oral QAC breakfast  . lidocaine-EPINEPHrine  30 mL Intradermal Once  . midodrine  10 mg Oral TID WC  . multivitamin  1 tablet Oral QHS  . piperacillin-tazobactam (ZOSYN)  IV  2.25 g Intravenous 3 times per day  . sevelamer carbonate  800 mg Oral TID WC  . sildenafil  80 mg Oral TID  . sodium chloride  3 mL Intravenous Q12H  . traZODone  50 mg Oral QHS  . vancomycin  1,000 mg Intravenous Q M,W,F-HD   Continuous Infusions:   PRN Meds:.acetaminophen, ALPRAZolam, oxyCODONE-acetaminophen **AND** oxyCODONE, polyethylene glycol  Antibiotics    Anti-infectives    Start     Dose/Rate Route Frequency Ordered Stop   11/16/14 1200  vancomycin (VANCOCIN) IVPB 1000 mg/200 mL premix     1,000 mg 200 mL/hr over 60 Minutes Intravenous  Once 11/16/14 1111 11/16/14 1333   11/15/14 1400  piperacillin-tazobactam (ZOSYN) IVPB 2.25 g     2.25 g 100 mL/hr over 30 Minutes Intravenous 3 times per day 11/15/14 0755     11/15/14 1200  vancomycin (VANCOCIN) IVPB 1000 mg/200 mL premix     1,000 mg 200 mL/hr over  60 Minutes Intravenous Every M-W-F (Hemodialysis) 11/15/14 0754     11/15/14 0200  vancomycin (VANCOCIN) IVPB 1000 mg/200 mL premix  Status:  Discontinued     1,000 mg 200 mL/hr over 60 Minutes Intravenous Every 24 hours 11/14/14 0252 11/15/14 0753   11/14/14 0800  piperacillin-tazobactam (ZOSYN) IVPB 3.375 g  Status:  Discontinued     3.375 g 12.5 mL/hr over 240 Minutes Intravenous 3 times per day 11/14/14 0240 11/15/14 0755   11/14/14 0600  piperacillin-tazobactam (ZOSYN) IVPB 3.375 g  Status:  Discontinued     3.375 g 100 mL/hr over 30 Minutes Intravenous 3 times per day 11/14/14 0233 11/14/14 0239   11/14/14 0130  vancomycin (VANCOCIN) IVPB 1000 mg/200 mL premix     1,000 mg 200 mL/hr over 60 Minutes Intravenous  Once 11/14/14 0126 11/14/14 0259   11/14/14 0115  piperacillin-tazobactam (ZOSYN) IVPB 3.375 g     3.375 g 100 mL/hr over 30 Minutes Intravenous  Once 11/14/14 0109 11/14/14 0159  Subjective:   Larry [name] seen and examined today.  Patient has no complaints today.  Denies chest pain, shortness of breath, abdominal pain, dizziness or headache.  Objective:   Filed Vitals:   11/20/14 0820 11/20/14 0850 11/20/14 0940 11/20/14 1020  BP: 98/55 96/58 96/58  91/51  Pulse: 76 73 71 73  Temp:      TempSrc:      Resp:      Height:      Weight:      SpO2:        Wt Readings from Last 3 Encounters:  11/20/14 97.3 kg (214 lb 8.1 oz)  10/11/14 97.9 kg (215 lb 13.3 oz)  08/24/14 102.4 kg (225 lb 12 oz)     Intake/Output Summary (Last 24 hours) at 11/20/14 1130 Last data filed at 11/19/14 1707  Gross per 24 hour  Intake    250 ml  Output      0 ml  Net    250 ml    Exam  General: Well developed, well nourished, NAD  HEENT: MMM, no masses on visual examination  Cardiovascular: No cyanosis, no murmurs  Respiratory: Clear to ausculation (R chest tube in place), no wheezes  Abdomen: Soft, nontender, nondistended, + bowel sounds  Extremities: warm dry  without cyanosis clubbing, Unna boots in place  Neuro: AAOx2, nonfocal  Psych: Appropriate  Data Review   Micro Results Recent Results (from the past 240 hour(s))  Culture, blood (routine x 2)     Status: None   Collection Time: 11/13/14 11:48 PM  Result Value Ref Range Status   Specimen Description BLOOD LEFT FOREARM  Final   Special Requests BOTTLES DRAWN AEROBIC ONLY 3CC  Final   Culture NO GROWTH 5 DAYS  Final   Report Status 11/19/2014 FINAL  Final  Culture, blood (routine x 2)     Status: None   Collection Time: 11/14/14 12:05 AM  Result Value Ref Range Status   Specimen Description BLOOD LEFT ARM  Final   Special Requests BOTTLES DRAWN AEROBIC AND ANAEROBIC 5CC  Final   Culture NO GROWTH 5 DAYS  Final   Report Status 11/19/2014 FINAL  Final  MRSA PCR Screening     Status: None   Collection Time: 11/14/14  3:05 AM  Result Value Ref Range Status   MRSA by PCR NEGATIVE NEGATIVE Final    Comment:        The GeneXpert MRSA Assay (FDA approved for NASAL specimens only), is one component of a comprehensive MRSA colonization surveillance program. It is not intended to diagnose MRSA infection nor to guide or monitor treatment for MRSA infections.   Urine culture     Status: None   Collection Time: 11/15/14  1:16 AM  Result Value Ref Range Status   Specimen Description URINE, RANDOM  Final   Special Requests NONE  Final   Culture NO GROWTH 1 DAY  Final   Report Status 11/16/2014 FINAL  Final    Radiology Reports Ct Abdomen Pelvis Wo Contrast  11/14/2014  ADDENDUM REPORT: 11/14/2014 08:10 ADDENDUM: Study discussed by telephone with Dr. Ree Kida On 11/14/2014 at 0800 hours. Electronically Signed   By: Genevie Ann M.D.   On: 11/14/2014 08:10  11/14/2014  CLINICAL DATA:  64 year old male with constant right side abdominal pain. Retroperitoneal hemorrhage in August. Subsequent encounter. EXAM: CT ABDOMEN AND PELVIS WITHOUT CONTRAST TECHNIQUE: Multidetector CT imaging of the  abdomen and pelvis was performed following the standard protocol without IV contrast. COMPARISON:  09/15/2014 and earlier. FINDINGS: Increased right pleural effusion, now almost completely opacifying the visible right lung base. On this noncontrast exam there are multiple round and nodular intermediate density foci within the right pleural space, individually up to 4 cm (series 2, image 19), new since August. The right lower lobe is partially collapsed and its airways partially filled with fluid ( "Drowned lung" ). There are parenchymal calcifications suggesting granulomas. Cardiomegaly with no pericardial effusion. No left pleural effusion and left lower lobe ventilation has improved. There are displaced lateral right eighth and ninth rib fractures which are new since August (series 3). These are in proximity to the intermediate density material in the right pleural space. Lower thoracic and lumbar vertebrae appear intact. Bilateral SI joint ankylosis. Degenerative changes at the hips. Proximal left femur orthopedic hardware. No other acute osseous abnormality identified. Bilateral flank anasarca has increased and now tracks to the pelvis. The large right retroperitoneal hemorrhage has decreased but not resolved, now encompassing 12 x 15.5 x 14 cm (AP by transverse by CC) versus 17 x 19 x 24 cm previously. Layering hematocrit again noted. The right psoas muscle is less abnormal now. Regional mass effect including anterior displacement of the right kidney is stable to mildly regressed. Interval regressed free fluid in the abdomen. Stable presacral stranding. Stable distal colon with intermittent retained stool. There is no longer gas in the urinary bladder. Diverticulosis of the sigmoid colon with no definite active inflammation. Mild gaseous distension of the left colon, transverse colon and right colon. Oral contrast has not reached the distal small bowel. No dilated or abnormal small bowel loops. Fat containing  umbilical hernia. Negative stomach and duodenum. The gallbladder is mildly distended, no pericholecystic stranding. Stable noncontrast liver parenchyma, spleen, pancreas, adrenal glands, and kidneys. Aortoiliac calcified atherosclerosis noted. Stable maximal celiac axis lymph nodes up to 14 mm. IMPRESSION: 1. Large volume right hemothorax with new displaced right lateral eighth and ninth rib fractures. Complete atelectasis of the visible right lung. 2. Regressed but not resolved large right retroperitoneal hemorrhage since August. Residual hemorrhage volume approximates 1,302 mL. 3. No new abnormality in the abdomen or pelvis. Electronically Signed: By: Genevie Ann M.D. On: 11/14/2014 07:52   Ct Head Wo Contrast  11/14/2014  CLINICAL DATA:  Altered mental status. EXAM: CT HEAD WITHOUT CONTRAST TECHNIQUE: Contiguous axial images were obtained from the base of the skull through the vertex without intravenous contrast. COMPARISON:  08/25/2014, 02/01/2014 FINDINGS: There is no intracranial hemorrhage or extra-axial fluid collection. There is no evidence of acute infarction. There is encephalomalacia due to remote right posterior MCA infarction. There is mild generalized atrophy. There is mild periventricular hypodensity consistent with chronic small vessel disease. Prominent pituitary soft tissues are consistent with the previously described adenoma. Visible paranasal sinuses are clear. IMPRESSION: 1. Remote right MCA infarction. 2. Unchanged pituitary adenoma. 3. Mild generalized atrophy and chronic small vessel disease. 4. No acute findings Electronically Signed   By: Andreas Newport M.D.   On: 11/14/2014 00:53   Dg Chest Port 1 View  11/20/2014  CLINICAL DATA:  Hemo pneumothorax.  Chest tube removal. EXAM: PORTABLE CHEST 1 VIEW COMPARISON:  11/19/2014. FINDINGS: Interim removal of right chest tube. Dual-lumen right IJ catheter stable position. Cardiac pacer stable position . Cardiomegaly. No pulmonary venous  congestion. Right lower lobe infiltrate and right pleural effusion. No pneumothorax noted on today's exam. IMPRESSION: 1. Interim removal of right chest tube. No pneumothorax noted on today's exam. Right IJ line in stable  position. 2. Cardiac pacer stable position.  Stable cardiomegaly. 3. Developing right lower lobe infiltrate and right pleural effusion. Electronically Signed   By: Marcello Moores  Register   On: 11/20/2014 07:47   Dg Chest Port 1 View  11/19/2014  CLINICAL DATA:  Traumatic hemo pneumothorax.  Right chest tube. EXAM: PORTABLE CHEST 1 VIEW COMPARISON:  11/27/2014. FINDINGS: Right chest tube and dual-lumen right IJ catheter in stable position. Small pneumothorax noted on today's exam. Persistent right lower lobe atelectasis. Cardiac pacer stable position. Cardiomegaly with normal pulmonary vascularity. IMPRESSION: 1. Right chest tube and dual lumen IJ catheter in stable position. There is a small pneumothorax noted on today's exam. 2. Persistent right base atelectasis. 3. Cardiac pacer stable position. Stable cardiomegaly. No pulmonary venous congestion. Critical Value/emergent results were called by telephone at the time of interpretation on 11/19/2014 at 7:40 am to nurse Alvis Lemmings, who verbally acknowledged these results. Electronically Signed   By: Marcello Moores  Register   On: 11/19/2014 07:42   Dg Chest Port 1 View  11/17/2014  CLINICAL DATA:  Follow-up hemopneumothorax on the right. EXAM: PORTABLE CHEST 1 VIEW COMPARISON:  11/16/2014 FINDINGS: Pacemaker AICD appears unchanged. Left chest is clear. Dual lumen catheter on the right has its tip at the SVC RA junction. Right chest tubes in place. I do not see a right pneumothorax. Atelectasis in the right lower lung persists. IMPRESSION: No pneumothorax visible on the right on today's study. Persistent atelectasis in the right lower lung. Electronically Signed   By: Nelson Chimes M.D.   On: 11/17/2014 08:54   Dg Chest Port 1 View  11/16/2014  CLINICAL DATA:   History of right rib fractures with hemothorax. EXAM: PORTABLE CHEST 1 VIEW COMPARISON:  11/15/2014 FINDINGS: Right dual lumen central venous catheter and dual lead AICD are stable. Right chest tube is also noted. Previously seen right apical pneumothorax is stable in size. There is haziness of the lung parenchyma in the right lower lobe, and possibly residual right hemothorax. Left lung is clear. Cardiomediastinal silhouette is stably enlarged. Mediastinal contours appear intact. Right-sided rib fractures again noted. Soft tissues are grossly normal. IMPRESSION: Stable residual right apical pneumothorax. Right lower lobe airspace opacity which may represent consolidation, or atelectasis. Probable small residual right hemothorax. Stable enlargement of the cardiac silhouette. Electronically Signed   By: Fidela Salisbury M.D.   On: 11/16/2014 12:28   Dg Chest Port 1 View  11/15/2014  CLINICAL DATA:  Traumatic hemopneumothorax EXAM: PORTABLE CHEST 1 VIEW COMPARISON:  November 14, 2014 FINDINGS: Chest tube is present on the right. There is a minimal right apical pneumothorax. There is persistent pleural effusion with patchy airspace consolidation in the mid and lower lung zones on the right. Left lung is clear except for mild left base atelectasis. Heart is enlarged with pulmonary vascularity within normal limits. Pacemaker leads are attached to the right atrium and right ventricle. Central catheter tip is in the right atrium slightly beyond the cavoatrial junction. There is again noted a displaced right lateral eighth rib fracture. IMPRESSION: Minimal right apical pneumothorax. Right effusion as well as airspace consolidation in the right mid and lower lung zones is present. Displaced right eighth rib fracture again noted. Left lung clear except for mild left base atelectasis. Stable cardiomegaly. Electronically Signed   By: Lowella Grip III M.D.   On: 11/15/2014 08:00   Dg Chest Port 1 View  11/14/2014   CLINICAL DATA:  64 year old male right side chest tube placement for hemothorax. Initial encounter. EXAM:  PORTABLE CHEST 1 VIEW COMPARISON:  CT Abdomen and Pelvis 0733 hours today, and earlier FINDINGS: Portable AP view at 1611 hours. New right chest tube placed. Significantly reduced right pleural effusion/hemothorax volume. Improved right lung ventilation. No pneumothorax identified. Displaced right eighth rib fracture re- identified. Stable cardiomegaly and mediastinal contours. Stable right IJ approach dual lumen dialysis type catheter. Stable left chest cardiac pacemaker. Residual right lung base atelectasis. IMPRESSION: Regressed right pleural effusion/ hemothorax and improved right lung ventilation status post right chest tube placement. No pneumothorax identified. Electronically Signed   By: Genevie Ann M.D.   On: 11/14/2014 16:19   Dg Chest Port 1 View  11/13/2014  CLINICAL DATA:  Short of breath EXAM: PORTABLE CHEST 1 VIEW COMPARISON:  09/04/2014 FINDINGS: Tunneled right internal jugular dialysis catheter in place. Tip is at the cavoatrial junction. Right PICC is been removed. Left subclavian AICD device and leads are stable. Large right pleural effusion has developed. This obscures the right lower lung. IMPRESSION: New large right pleural effusion. Electronically Signed   By: Marybelle Killings M.D.   On: 11/13/2014 23:43    CBC  Recent Labs Lab 11/14/14 0006  11/17/14 0755 11/17/14 2021 11/18/14 0406 11/19/14 0423 11/20/14 0540  WBC 6.0  < > 6.3 6.4 5.7 5.7 6.6  HGB 9.1*  < > 9.0* 9.4* 8.9* 9.0* 9.0*  HCT 29.2*  < > 29.3* 30.3* 28.9* 28.6* 28.5*  PLT 228  < > 162 162 161 129* 140*  MCV 101.7*  < > 102.4* 102.0* 101.0* 103.6* 102.5*  MCH 31.7  < > 31.5 31.6 31.1 32.6 32.4  MCHC 31.2  < > 30.7 31.0 30.8 31.5 31.6  RDW 19.5*  < > 20.1* 20.0* 20.0* 20.3* 20.0*  LYMPHSABS 0.7  --   --   --   --   --  0.8  MONOABS 0.9  --   --   --   --   --  0.9  EOSABS 0.2  --   --   --   --   --  0.6   BASOSABS 0.1  --   --   --   --   --  0.1  < > = values in this interval not displayed.  Chemistries   Recent Labs Lab 11/14/14 0006  11/14/14 0329 11/15/14 0409  11/17/14 0755 11/17/14 2021 11/18/14 0406 11/19/14 0423 11/20/14 0540 11/20/14 0810  NA 130*  < > 130* 127*  < > 129* 128* 131* 130* 130* 131*  K 4.7  < > 4.5 4.8  < > 3.8 4.6 4.3 4.0 4.0 4.0  CL 93*  < > 93* 91*  < > 93* 93* 96* 95* 93* 94*  CO2 26  --  26 24  < > 27 23 22 26 22 23   GLUCOSE 124*  < > 132* 114*  < > 102* 115* 88 99 103* 127*  BUN 12  < > 13 22*  < > 14 20 22* 16 24* 23*  CREATININE 2.67*  < > 2.75* 3.78*  < > 3.37* 3.81* 4.10* 3.61* 4.34* 4.42*  CALCIUM 8.3*  --  8.1* 8.2*  < > 8.1* 8.0* 8.3* 7.9* 8.6* 8.2*  AST 329*  --  275* 167*  --  49*  --   --  30  --   --   ALT 236*  --  214* 175*  --  87*  --   --  52  --   --   ALKPHOS  119  --  108 94  --  81  --   --  71  --   --   BILITOT 0.8  --  0.9 1.0  --  0.8  --   --  0.7  --   --   < > = values in this interval not displayed. ------------------------------------------------------------------------------------------------------------------ estimated creatinine clearance is 20.3 mL/min (by C-G formula based on Cr of 4.42). ------------------------------------------------------------------------------------------------------------------ No results for input(s): HGBA1C in the last 72 hours. ------------------------------------------------------------------------------------------------------------------ No results for input(s): CHOL, HDL, LDLCALC, TRIG, CHOLHDL, LDLDIRECT in the last 72 hours. ------------------------------------------------------------------------------------------------------------------ No results for input(s): TSH, T4TOTAL, T3FREE, THYROIDAB in the last 72 hours.  Invalid input(s): FREET3 ------------------------------------------------------------------------------------------------------------------ No results for input(s):  VITAMINB12, FOLATE, FERRITIN, TIBC, IRON, RETICCTPCT in the last 72 hours.  Coagulation profile  Recent Labs Lab 11/14/14 1316 11/15/14 0409 11/16/14 0355 11/17/14 0755 11/18/14 0406  INR 2.47* 1.62* 1.41 1.57* 1.48    No results for input(s): DDIMER in the last 72 hours.  Cardiac Enzymes  Recent Labs Lab 11/14/14 0006  TROPONINI 0.06*   ------------------------------------------------------------------------------------------------------------------ Invalid input(s): POCBNP    Riann Oman D.O. on 11/20/2014 at 11:30 AM  Between 7am to 7pm - Pager - [phone]  After 7pm go to [email or web address] - password [email or web address]  And look for the night coverage person covering for me after hours  [facility] Office  [phone]

## 2014-11-20 NOTE — Progress Notes (Signed)
Physical Therapy Treatment Patient Details Name: Larry [name] MRN: [id number] DOB: 1950/08/25 Today's Date: 11/20/2014    History of Present Illness SUNIL HUE is a 64 y.o. male admitted from [facility] with encephalopathy and R pleural effusion. He apparently fell 3x while at [facility] and also has 2right rib fx. Pt with recent admit July-Sept after left hip fx s/p IM nail, pacemaker, initiation of HD due to ESRD. PMHx NICM, CAD, DDD, back pain, CVA, obesity.     PT Comments    Continues to make good progress towards functional goals. Tolerating further ambulatory distance today, SpO2 100%, HR 90. Still fatigues easily but able to continue with encouragement and energy conservation techniques. Patient will continue to benefit from skilled physical therapy services to further improve independence with functional mobility.   Follow Up Recommendations  SNF;Supervision/Assistance - 24 hour     Equipment Recommendations  Rolling walker with 5" wheels    Recommendations for Other Services       Precautions / Restrictions Precautions Precautions: Fall Restrictions Weight Bearing Restrictions: No    Mobility  Bed Mobility               General bed mobility comments: sitting in chair  Transfers Overall transfer level: Needs assistance Equipment used: Rolling walker (2 wheeled) Transfers: Sit to/from Stand Sit to Stand: Min guard         General transfer comment: Min guard for safety. VC for hand placement. Performed from recliner x2  Ambulation/Gait Ambulation/Gait assistance: Min guard Ambulation Distance (Feet): 55 Feet (additonal bout of 45) Assistive device: Rolling walker (2 wheeled) Gait Pattern/deviations: Step-through pattern;Decreased stride length;Trunk flexed;Narrow base of support Gait velocity: decreased Gait velocity interpretation: Below normal speed for age/gender General Gait Details: VC for upright posture, energy conservation techniques, and  pursed lip breathing. Required 2 standing rest breaks to complete first 55 foot distance before sitting. Then, required 1 standing rest break to complete 2nd 45 foot distance. No overt loss of balance noted.   Stairs            Wheelchair Mobility    Modified Rankin (Stroke Patients Only)       Balance                                    Cognition Arousal/Alertness: Awake/alert Behavior During Therapy: WFL for tasks assessed/performed Overall Cognitive Status: Within Functional Limits for tasks assessed                      Exercises      General Comments General comments (skin integrity, edema, etc.): SpO2 100% before and after ambulating on room air. HR 90      Pertinent Vitals/Pain Pain Assessment: Faces Faces Pain Scale: Hurts a little bit Pain Location: back and Lt heel Pain Descriptors / Indicators: Constant;Sore Pain Intervention(s): Monitored during session;Repositioned    Home Living                      Prior Function            PT Goals (current goals can now be found in the care plan section) Acute Rehab PT Goals Patient Stated Goal: be able to get better PT Goal Formulation: With patient/family Time For Goal Achievement: 11/28/14 Potential to Achieve Goals: Fair Progress towards PT goals: Progressing toward goals    Frequency  Min 3X/week  PT Plan Current plan remains appropriate    Co-evaluation             End of Session Equipment Utilized During Treatment: Gait belt Activity Tolerance: Patient tolerated treatment well Patient left: in chair;with call bell/phone within reach;with family/visitor present     Time: 5749-3552 PT Time Calculation (min) (ACUTE ONLY): 19 min  Charges:  $Gait Training: 8-22 mins                    G Codes:      Ellouise Newer 11/22/14, 6:13 PM Camille Bal [place], [place]

## 2014-11-20 NOTE — Progress Notes (Signed)
ANTIBIOTIC CONSULT NOTE  Pharmacy Consult for Zosyn and Vancomycin  Indication: diabetic foot infection  Allergies  Allergen Reactions  . Bydureon [Exenatide] Other (See Comments)    sweating  . Losartan Potassium Other (See Comments)    insomnia  . Gabapentin Other (See Comments)    confusion    Patient Measurements: Height: 5\' 10"  (177.8 cm) Weight: 214 lb 8.1 oz (97.3 kg) IBW/kg (Calculated) : 73  Vital Signs: Temp: 97.8 F (36.6 C) (10/12 0737) Temp Source: Oral (10/12 0737) BP: 91/51 mmHg (10/12 1020) Pulse Rate: 73 (10/12 1020) Intake/Output from previous day: 10/11 0701 - 10/12 0700 In: 450 [P.O.:450] Out: -  Intake/Output from this shift:    Labs:  Recent Labs  11/18/14 0406 11/19/14 0423 11/20/14 0540 11/20/14 0810  WBC 5.7 5.7 6.6  --   HGB 8.9* 9.0* 9.0*  --   PLT 161 129* 140*  --   CREATININE 4.10* 3.61* 4.34* 4.42*   Estimated Creatinine Clearance: 20.3 mL/min (by C-G formula based on Cr of 4.42).  Recent Labs  11/20/14 0540  [facility] 23     Microbiology: Recent Results (from the past 720 hour(s))  Culture, blood (routine x 2)     Status: None   Collection Time: 11/13/14 11:48 PM  Result Value Ref Range Status   Specimen Description BLOOD LEFT FOREARM  Final   Special Requests BOTTLES DRAWN AEROBIC ONLY 3CC  Final   Culture NO GROWTH 5 DAYS  Final   Report Status 11/19/2014 FINAL  Final  Culture, blood (routine x 2)     Status: None   Collection Time: 11/14/14 12:05 AM  Result Value Ref Range Status   Specimen Description BLOOD LEFT ARM  Final   Special Requests BOTTLES DRAWN AEROBIC AND ANAEROBIC 5CC  Final   Culture NO GROWTH 5 DAYS  Final   Report Status 11/19/2014 FINAL  Final  MRSA PCR Screening     Status: None   Collection Time: 11/14/14  3:05 AM  Result Value Ref Range Status   MRSA by PCR NEGATIVE NEGATIVE Final    Comment:        The GeneXpert MRSA Assay (FDA approved for NASAL specimens only), is one component of  a comprehensive MRSA colonization surveillance program. It is not intended to diagnose MRSA infection nor to guide or monitor treatment for MRSA infections.   Urine culture     Status: None   Collection Time: 11/15/14  1:16 AM  Result Value Ref Range Status   Specimen Description URINE, RANDOM  Final   Special Requests NONE  Final   Culture NO GROWTH 1 DAY  Final   Report Status 11/16/2014 FINAL  Final    Anti-infectives    Start     Dose/Rate Route Frequency Ordered Stop   11/16/14 1200  vancomycin (VANCOCIN) IVPB 1000 mg/200 mL premix     1,000 mg 200 mL/hr over 60 Minutes Intravenous  Once 11/16/14 1111 11/16/14 1333   11/15/14 1400  piperacillin-tazobactam (ZOSYN) IVPB 2.25 g     2.25 g 100 mL/hr over 30 Minutes Intravenous 3 times per day 11/15/14 0755     11/15/14 1200  vancomycin (VANCOCIN) IVPB 1000 mg/200 mL premix     1,000 mg 200 mL/hr over 60 Minutes Intravenous Every M-W-F (Hemodialysis) 11/15/14 0754     11/15/14 0200  vancomycin (VANCOCIN) IVPB 1000 mg/200 mL premix  Status:  Discontinued     1,000 mg 200 mL/hr over 60 Minutes Intravenous Every 24 hours  11/14/14 0252 11/15/14 0753   11/14/14 0800  piperacillin-tazobactam (ZOSYN) IVPB 3.375 g  Status:  Discontinued     3.375 g 12.5 mL/hr over 240 Minutes Intravenous 3 times per day 11/14/14 0240 11/15/14 0755   11/14/14 0600  piperacillin-tazobactam (ZOSYN) IVPB 3.375 g  Status:  Discontinued     3.375 g 100 mL/hr over 30 Minutes Intravenous 3 times per day 11/14/14 0233 11/14/14 0239   11/14/14 0130  vancomycin (VANCOCIN) IVPB 1000 mg/200 mL premix     1,000 mg 200 mL/hr over 60 Minutes Intravenous  Once 11/14/14 0126 11/14/14 0259   11/14/14 0115  piperacillin-tazobactam (ZOSYN) IVPB 3.375 g     3.375 g 100 mL/hr over 30 Minutes Intravenous  Once 11/14/14 0109 11/14/14 0159      Assessment: 21 yom presented to the hospital with AMS. On day #7 vancomycin and zosyn for diabetic foot infection. Pt is  Afebrile, WBC wnl. ESRD-MWF, currently in HD now. A random vancomycin level this morning was therapeutic at 23.    Goal of Therapy:  Pre-HD vanc level 15-25 mcg/mL Resolution of infection   Plan:  Will continue vancomcyin 1 g IV qHD-MWF but recommend discontinuing   Continue Zosyn to 2.25g IV 8h Monitor clinical picture, renal function F/U C&S, abx deescalation / LOT, VR as needed  Baran Kuhrt, Jake Church 11/20/2014,10:36 AM

## 2014-11-20 NOTE — Progress Notes (Signed)
Orthopedic Tech Progress Note Patient Details:  Larry [name] [birth year] [id number]  Ortho Devices Type of Ortho Device: Louretta Parma boot Ortho Device/Splint Location: (B) LE Ortho Device/Splint Interventions: Application, Ordered   [name] 11/20/2014, 6:27 PM

## 2014-11-20 NOTE — Procedures (Signed)
I was present at this dialysis session. I have reviewed the session itself and made appropriate changes.   TDC.  BP stable, pt tolerating well thus far.  A little bit anxious.  Asks to sit up but when offered  Recliner for HD this am he declined.   Pearson Grippe  MD 11/20/2014, 7:53 AM

## 2014-11-20 NOTE — Progress Notes (Signed)
OT Cancellation Note  Patient Details Name: Larry [name] MRN: [id number] DOB: September 07, 1950   Cancelled Treatment:    Reason Eval/Treat Not Completed: Medical issues which prohibited therapy (HD)  [name] B 11/20/2014, 11:33 AM   Jeri Modena   OTR/L Pager: [phone] Office: [phone] .

## 2014-11-21 LAB — GLUCOSE, CAPILLARY
GLUCOSE-CAPILLARY: 90 mg/dL (ref 65–99)
GLUCOSE-CAPILLARY: 95 mg/dL (ref 65–99)
Glucose-Capillary: 112 mg/dL — ABNORMAL HIGH (ref 65–99)
Glucose-Capillary: 110 mg/dL — ABNORMAL HIGH (ref 65–99)

## 2014-11-21 MED ORDER — AMOXICILLIN-POT CLAVULANATE 500-125 MG PO TABS
1.0000 | ORAL_TABLET | ORAL | Status: DC
  Administered 2014-11-21: 500 mg via ORAL
  Filled 2014-11-21 (×2): qty 1

## 2014-11-21 NOTE — Progress Notes (Signed)
Subjective:   No complaints- working on SNF placement  Objective Filed Vitals:   11/20/14 1153 11/20/14 2202 11/21/14 0602 11/21/14 0751  BP: 96/53 93/57 97/56  93/53  Pulse: 76 77 69 73  Temp: 98.2 F (36.8 C) 98 F (36.7 C) 98.3 F (36.8 C) 97.4 F (36.3 C)  TempSrc: Oral Oral  Oral  Resp: 18 17 17    Height:      Weight: 95.2 kg (209 lb 14.1 oz)  94.1 kg (207 lb 7.3 oz)   SpO2: 99% 100% 100% 100%   Physical Exam General:alert and oriented, sitting in chair, no acute distress. Wife at bedside Heart: RRR 2/6  Lungs: CTA, unlabored Abdomen: soft, nontender +BS Extremities: bilat LE edema. Legs wrapped Dialysis Access: R IJ cath. R AVF maturing +b/t  MWFS   4hrs 95kgs 4K/2.25 Ca Cath No heparin P4 micera 50 q 2 weeks- last given9/28  Assessment/Plan: 1. AMS- back to baseline- caution with xanax 2. Rib fractures/ hemothorax- trauma surgery following. chest tube removed. No heparin in HD, no coumadin 3. ESRD - MWFS, HD tomorrow, K+4 4. 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 Has LE edema, may need lower dry weight, get vol down some more if BP will tolerate.cont midodrine 5. Anemia - on mircera at OP unit, hgb 9/ dose Aranesp this week 6. Metabolic bone disease - cont renvela, last phos 5.7 and PTH 177 7. Nutrition - MVI. Alb 2. reports appetite much improved  8. Chronic hypotension on midodrine- SBP in the 90s 9. AFib on amiodarone, coumadin stopped due to bleeding events (recent RP bleed, then hemothorax) and hx of falls- on amiodarone 10. Cellulitis- on Vanc 11. Anxiety- gets VERY anxious end of HD.  12. DNR 13. dispo- to SNF  Shelle Iron, NP [facility] [phone] 11/21/2014,8:58 AM  LOS: 7 days    Additional Objective Labs: Basic Metabolic Panel:  Recent Labs Lab 11/16/14 0815  11/17/14 2021  11/19/14 0423 11/20/14 0540 11/20/14 0810  NA 130*  < > 128*  < > 130* 130* 131*   K 3.7  < > 4.6  < > 4.0 4.0 4.0  CL 96*  < > 93*  < > 95* 93* 94*  CO2 26  < > 23  < > 26 22 23   GLUCOSE 103*  < > 115*  < > 99 103* 127*  BUN 16  < > 20  < > 16 24* 23*  CREATININE 3.06*  < > 3.81*  < > 3.61* 4.34* 4.42*  CALCIUM 8.0*  < > 8.0*  < > 7.9* 8.6* 8.2*  PHOS 4.4  --  5.3*  --   --   --  5.7*  < > = values in this interval not displayed. Liver Function Tests:  Recent Labs Lab 11/15/14 0409  11/17/14 0755 11/17/14 2021 11/19/14 0423 11/20/14 0810  AST 167*  --  49*  --  30  --   ALT 175*  --  87*  --  52  --   ALKPHOS 94  --  81  --  71  --   BILITOT 1.0  --  0.8  --  0.7  --   PROT 6.3*  --  6.1*  --  6.0*  --   ALBUMIN 2.5*  < > 2.3* 2.5* 2.2* 2.0*  < > = values in this interval not displayed. No results for input(s): LIPASE, AMYLASE in the last 168 hours. CBC:  Recent Labs Lab 11/17/14 0755 11/17/14 2021 11/18/14 0406 11/19/14 0423 11/20/14 0540  WBC 6.3 6.4 5.7 5.7 6.6  NEUTROABS  --   --   --   --  4.1  HGB 9.0* 9.4* 8.9* 9.0* 9.0*  HCT 29.3* 30.3* 28.9* 28.6* 28.5*  MCV 102.4* 102.0* 101.0* 103.6* 102.5*  PLT 162 162 161 129* 140*   Blood Culture    Component Value Date/Time   SDES URINE, RANDOM 11/15/2014 0116   SPECREQUEST NONE 11/15/2014 0116   CULT NO GROWTH 1 DAY 11/15/2014 0116   REPTSTATUS 11/16/2014 FINAL 11/15/2014 0116    Cardiac Enzymes: No results for input(s): CKTOTAL, CKMB, CKMBINDEX, TROPONINI in the last 168 hours. CBG:  Recent Labs Lab 11/19/14 1702 11/19/14 2215 11/20/14 1218 11/20/14 2200 11/21/14 0739  GLUCAP 106* 112* 81 100* 90   Iron Studies: No results for input(s): IRON, TIBC, TRANSFERRIN, FERRITIN in the last 72 hours. @lablastinr3 @ Studies/Results: Dg Chest Port 1 View  11/20/2014  CLINICAL DATA:  Hemo pneumothorax.  Chest tube removal. EXAM: PORTABLE CHEST 1 VIEW COMPARISON:  11/19/2014. FINDINGS: Interim removal of right chest tube. Dual-lumen right IJ catheter stable position. Cardiac pacer stable  position . Cardiomegaly. No pulmonary venous congestion. Right lower lobe infiltrate and right pleural effusion. No pneumothorax noted on today's exam. IMPRESSION: 1. Interim removal of right chest tube. No pneumothorax noted on today's exam. Right IJ line in stable position. 2. Cardiac pacer stable position.  Stable cardiomegaly. 3. Developing right lower lobe infiltrate and right pleural effusion. Electronically Signed   By: Marcello Moores  Register   On: 11/20/2014 07:47   Medications:   . amiodarone  200 mg Oral Daily  . atorvastatin  40 mg Oral Daily  . bisacodyl  5-10 mg Oral QHS  . collagenase   Topical Daily  . darbepoetin (ARANESP) injection - DIALYSIS  60 mcg Intravenous Q Mon-HD  . DULoxetine  60 mg Oral BID  . insulin aspart  0-9 Units Subcutaneous TID WC  . insulin glargine  10 Units Subcutaneous QHS  . levothyroxine  75 mcg Oral QAC breakfast  . lidocaine-EPINEPHrine  30 mL Intradermal Once  . midodrine  10 mg Oral TID WC  . multivitamin  1 tablet Oral QHS  . piperacillin-tazobactam (ZOSYN)  IV  2.25 g Intravenous 3 times per day  . sevelamer carbonate  800 mg Oral TID WC  . sildenafil  80 mg Oral TID  . sodium chloride  3 mL Intravenous Q12H  . traZODone  50 mg Oral QHS  . vancomycin  1,000 mg Intravenous Q M,W,F-HD

## 2014-11-21 NOTE — Clinical Social Work Note (Addendum)
CSW spoke to patient's wife who agreed to have patient return to [place].  CSW informed [facility] that patient should be able to discharge tomorrow as long as he is medically ready for discharge and orders have been received.  CSW to continue to follow patient's progress FL2 and DNR on patient's chart awaiting signature by physician.  Jones Broom. [place], MSW, [place] 11/21/2014 4:21 PM

## 2014-11-21 NOTE — Progress Notes (Signed)
ANTIBIOTIC CONSULT NOTE  Pharmacy Consult for Augmentin Indication: diabetic foot infection  Allergies  Allergen Reactions  . Bydureon [Exenatide] Other (See Comments)    sweating  . Losartan Potassium Other (See Comments)    insomnia  . Gabapentin Other (See Comments)    confusion    Patient Measurements: Height: 5\' 10"  (177.8 cm) Weight: 207 lb 7.3 oz (94.1 kg) IBW/kg (Calculated) : 73  Vital Signs: Temp: 97.9 F (36.6 C) (10/13 1413) Temp Source: Oral (10/13 1413) BP: 96/61 mmHg (10/13 1413) Pulse Rate: 77 (10/13 1413) Intake/Output from previous day: 10/12 0701 - 10/13 0700 In: -  Out: 2013  Intake/Output from this shift:    Labs:  Recent Labs  11/19/14 0423 11/20/14 0540 11/20/14 0810  WBC 5.7 6.6  --   HGB 9.0* 9.0*  --   PLT 129* 140*  --   CREATININE 3.61* 4.34* 4.42*   Estimated Creatinine Clearance: 20 mL/min (by C-G formula based on Cr of 4.42).  Recent Labs  11/20/14 0540  [facility] 23     Microbiology: Recent Results (from the past 720 hour(s))  Culture, blood (routine x 2)     Status: None   Collection Time: 11/13/14 11:48 PM  Result Value Ref Range Status   Specimen Description BLOOD LEFT FOREARM  Final   Special Requests BOTTLES DRAWN AEROBIC ONLY 3CC  Final   Culture NO GROWTH 5 DAYS  Final   Report Status 11/19/2014 FINAL  Final  Culture, blood (routine x 2)     Status: None   Collection Time: 11/14/14 12:05 AM  Result Value Ref Range Status   Specimen Description BLOOD LEFT ARM  Final   Special Requests BOTTLES DRAWN AEROBIC AND ANAEROBIC 5CC  Final   Culture NO GROWTH 5 DAYS  Final   Report Status 11/19/2014 FINAL  Final  MRSA PCR Screening     Status: None   Collection Time: 11/14/14  3:05 AM  Result Value Ref Range Status   MRSA by PCR NEGATIVE NEGATIVE Final    Comment:        The GeneXpert MRSA Assay (FDA approved for NASAL specimens only), is one component of a comprehensive MRSA colonization surveillance  program. It is not intended to diagnose MRSA infection nor to guide or monitor treatment for MRSA infections.   Urine culture     Status: None   Collection Time: 11/15/14  1:16 AM  Result Value Ref Range Status   Specimen Description URINE, RANDOM  Final   Special Requests NONE  Final   Culture NO GROWTH 1 DAY  Final   Report Status 11/16/2014 FINAL  Final    Anti-infectives    Start     Dose/Rate Route Frequency Ordered Stop   11/16/14 1200  vancomycin (VANCOCIN) IVPB 1000 mg/200 mL premix     1,000 mg 200 mL/hr over 60 Minutes Intravenous  Once 11/16/14 1111 11/16/14 1333   11/15/14 1400  piperacillin-tazobactam (ZOSYN) IVPB 2.25 g     2.25 g 100 mL/hr over 30 Minutes Intravenous 3 times per day 11/15/14 0755     11/15/14 1200  vancomycin (VANCOCIN) IVPB 1000 mg/200 mL premix     1,000 mg 200 mL/hr over 60 Minutes Intravenous Every M-W-F (Hemodialysis) 11/15/14 0754     11/15/14 0200  vancomycin (VANCOCIN) IVPB 1000 mg/200 mL premix  Status:  Discontinued     1,000 mg 200 mL/hr over 60 Minutes Intravenous Every 24 hours 11/14/14 0252 11/15/14 0753   11/14/14 0800  piperacillin-tazobactam (ZOSYN) IVPB 3.375 g  Status:  Discontinued     3.375 g 12.5 mL/hr over 240 Minutes Intravenous 3 times per day 11/14/14 0240 11/15/14 0755   11/14/14 0600  piperacillin-tazobactam (ZOSYN) IVPB 3.375 g  Status:  Discontinued     3.375 g 100 mL/hr over 30 Minutes Intravenous 3 times per day 11/14/14 0233 11/14/14 0239   11/14/14 0130  vancomycin (VANCOCIN) IVPB 1000 mg/200 mL premix     1,000 mg 200 mL/hr over 60 Minutes Intravenous  Once 11/14/14 0126 11/14/14 0259   11/14/14 0115  piperacillin-tazobactam (ZOSYN) IVPB 3.375 g     3.375 g 100 mL/hr over 30 Minutes Intravenous  Once 11/14/14 0109 11/14/14 0159     Assessment: 89 yom presented to the hospital with AMS. On day #8 vancomycin and zosyn for diabetic foot infection. Now to transition to PO Augmentin.  Afebrile, WBC wnl.  ESRD-MWF.   Goal of Therapy:  Resolution of infection  Plan:  -will discontinue IV antibiotics -Augmentin 500/125mg  PO q24h starting tonight at 180 -will enter LOT for 10 days total- antibiotics will stop after Augmentin dose on 10/15 -pharmacy to sign off- please re-consult if needed.   Lurleen Soltero D. Elisabel Hanover, PharmD, BCPS Clinical Pharmacist Pager: [phone] 11/21/2014 3:13 PM

## 2014-11-21 NOTE — Progress Notes (Signed)
[facility]                                                                              Patient Demographics  Larry [name], is a 64 y.o. male, DOB - [birth year], CMK:[id number]  Admit date - 11/13/2014   Admitting Physician Ivor Costa, MD  Outpatient Primary MD for the patient is Larry Pac, MD  LOS - 7   Chief Complaint  Patient presents with  . Altered Mental Status      HPI on 11/14/2014 by Dr. Felisa Bonier is a 64 y.o. 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 The patient's wife, patient has been mildly confused in the past 7 days. He has mild cough and shortness of breath, but no chest pain, fever or chills. Patient complains of abdominal pain. It is located in the right abdomen, constant, mild, radiating to the right shoulder, which has been going on for few weeks after he had a fall. He is constipated, no nausea, vomiting, diarrhea. He does not have symptoms of UTI. Patient done does not seem to have unilateral weakness, vision changes, hearing loss. In ED, patient was found to have WBC 6.0, temperature normal, no tachycardia, creatinine 2.50, BUN 15, potassium 4.6, lactate of 1.62, INR 2.94. Chest x-ray showed new large right pleural effusion. CT-head showed remote right MCA infarction, unchanged pituitary adenoma, mild generalized atrophy and chronic small vessel disease, no acute findings.   Interim history Patient found to have right rib fractures with hemothorax. Trauma surgery was consulted and chest tube placed. Patient found to have a small right apical pneumothorax.  PT has recommended SNF on  discharge  Assessment & Plan   Right Rib Fractures with hemothorax -Secondary to falls -Seen on CT abdomen -Trauma surgery consulted and appreciated, chest tube inserted and removed 11/22/14   -Coumadin discontinued - PT on board and recommending SNF  Spontaneous pneumothorax -CXR: small PTX  - Will monitor 24 hours after removal of chest tube.  Acute encephalopathy -Resolved, Likely secondary to the above -CT head: Reported no acute findings -Currently afebrile, no leukocytosis, however patient started on vanc/zosyn, narrow to augmentin, consulted pharmacy for appropriate renal dose. -Xanax and Temazepam held -PT recommended SNF  Essential hypertension -Blood pressure is soft on admission. -continue midodrine  Macrocytic Anemia -Hb 9.0, drop likely secondary to hemothorax/hemorrhage -Baseline Hb 11 -Continue to monitor CBC  Diabetes mellitus, type 2 -Last A1c 7.7 on 06/26/14 -Continue lantus, ISS, CBG monitoring  Depression with anxiety: -hold Xanax due to AMS -continue cymbalta  Atrial Fibrillation -CHADSVASC 5 -Currently rate controlled -Was on coumadin, however, discontinued due to hemothorax and continued right retroperitoneal hemorrhage. -INR 1.48 (Vitamin K given on 11/14/2014) -Continue amiodarone -Cardiology consulted and appreciated, agreed with holding coumadin until bleeding stops  Antiphospholipid antibody -coumadin discontinued, discussed risks and benefits with the wife, she would like input from cardiology -Per Wife, patient has not had PE or  DVT  Chronic systolic CHF (congestive heart failure) -Echocardiogram on 02/02/14 showed EF 20-25%.  -Currently compensated -Managed by dialysis  ESRD (end stage renal disease) on dialysis (MWFS) -Nephrology consulted and appreciated  Abdominal pain/ Large Right Retroperitoneal hemorrhage/ Elevated transaminases -CT abdomen/pelvis: no new abnormality  -Lipase 20 -AST/ALT improving- continue to  monitor  Dyspnea/Cough -Improving, Secondary to above, continue treatment plan as above  Multiple wounds -Stage 3 left heel pressure ulcer, left dorsal wound, right dorsal wound -Wound care consulted, unna boots placed   Physical deconditioning -PT consulted and recommended SNF  Code Status: DNR  Family Communication: Wife at bedside.  Disposition Plan:  SNF once ready for d/c  Time Spent in minutes   30 minutes  Procedures  Chest tube inserted  Consults   Trauma surgery Nephrology Cardiology  DVT Prophylaxis  SCDs  Lab Results  Component Value Date   PLT 140* 11/20/2014    Medications  Scheduled Meds: . amiodarone  200 mg Oral Daily  . atorvastatin  40 mg Oral Daily  . bisacodyl  5-10 mg Oral QHS  . collagenase   Topical Daily  . darbepoetin (ARANESP) injection - DIALYSIS  60 mcg Intravenous Q Mon-HD  . DULoxetine  60 mg Oral BID  . insulin aspart  0-9 Units Subcutaneous TID WC  . insulin glargine  10 Units Subcutaneous QHS  . levothyroxine  75 mcg Oral QAC breakfast  . lidocaine-EPINEPHrine  30 mL Intradermal Once  . midodrine  10 mg Oral TID WC  . multivitamin  1 tablet Oral QHS  . piperacillin-tazobactam (ZOSYN)  IV  2.25 g Intravenous 3 times per day  . sevelamer carbonate  800 mg Oral TID WC  . sildenafil  80 mg Oral TID  . sodium chloride  3 mL Intravenous Q12H  . traZODone  50 mg Oral QHS  . vancomycin  1,000 mg Intravenous Q M,W,F-HD   Continuous Infusions:   PRN Meds:.acetaminophen, ALPRAZolam, oxyCODONE-acetaminophen **AND** oxyCODONE, polyethylene glycol  Antibiotics    Anti-infectives    Start     Dose/Rate Route Frequency Ordered Stop   11/16/14 1200  vancomycin (VANCOCIN) IVPB 1000 mg/200 mL premix     1,000 mg 200 mL/hr over 60 Minutes Intravenous  Once 11/16/14 1111 11/16/14 1333   11/15/14 1400  piperacillin-tazobactam (ZOSYN) IVPB 2.25 g     2.25 g 100 mL/hr over 30 Minutes Intravenous 3 times per day 11/15/14 0755     11/15/14  1200  vancomycin (VANCOCIN) IVPB 1000 mg/200 mL premix     1,000 mg 200 mL/hr over 60 Minutes Intravenous Every M-W-F (Hemodialysis) 11/15/14 0754     11/15/14 0200  vancomycin (VANCOCIN) IVPB 1000 mg/200 mL premix  Status:  Discontinued     1,000 mg 200 mL/hr over 60 Minutes Intravenous Every 24 hours 11/14/14 0252 11/15/14 0753   11/14/14 0800  piperacillin-tazobactam (ZOSYN) IVPB 3.375 g  Status:  Discontinued     3.375 g 12.5 mL/hr over 240 Minutes Intravenous 3 times per day 11/14/14 0240 11/15/14 0755   11/14/14 0600  piperacillin-tazobactam (ZOSYN) IVPB 3.375 g  Status:  Discontinued     3.375 g 100 mL/hr over 30 Minutes Intravenous 3 times per day 11/14/14 0233 11/14/14 0239   11/14/14 0130  vancomycin (VANCOCIN) IVPB 1000 mg/200 mL premix     1,000 mg 200 mL/hr over 60 Minutes Intravenous  Once 11/14/14 0126 11/14/14 0259   11/14/14 0115  piperacillin-tazobactam (ZOSYN) IVPB 3.375 g     3.375 g 100  mL/hr over 30 Minutes Intravenous  Once 11/14/14 0109 11/14/14 0159      Subjective:   [name] seen and examined today.  Pt feels better today. No new complaints reported.  Objective:   Filed Vitals:   11/20/14 2202 11/21/14 0602 11/21/14 0751 11/21/14 1413  BP: 93/57 97/56 93/53  96/61  Pulse: 77 69 73 77  Temp: 98 F (36.7 C) 98.3 F (36.8 C) 97.4 F (36.3 C) 97.9 F (36.6 C)  TempSrc: Oral  Oral Oral  Resp: 17 17  17   Height:      Weight:  94.1 kg (207 lb 7.3 oz)    SpO2: 100% 100% 100% 100%    Wt Readings from Last 3 Encounters:  11/21/14 94.1 kg (207 lb 7.3 oz)  10/11/14 97.9 kg (215 lb 13.3 oz)  08/24/14 102.4 kg (225 lb 12 oz)    No intake or output data in the 24 hours ending 11/21/14 1449  Exam  General: Well developed, well nourished, NAD  HEENT: MMM, no masses on visual examination  Cardiovascular: No cyanosis, no murmurs  Respiratory: Clear to ausculation (R chest tube in place), no wheezes  Abdomen: Soft, nontender, nondistended, +  bowel sounds  Extremities: warm dry without cyanosis clubbing, Unna boots in place  Neuro: AAOx2, nonfocal  Psych: Appropriate  Data Review   Micro Results Recent Results (from the past 240 hour(s))  Culture, blood (routine x 2)     Status: None   Collection Time: 11/13/14 11:48 PM  Result Value Ref Range Status   Specimen Description BLOOD LEFT FOREARM  Final   Special Requests BOTTLES DRAWN AEROBIC ONLY 3CC  Final   Culture NO GROWTH 5 DAYS  Final   Report Status 11/19/2014 FINAL  Final  Culture, blood (routine x 2)     Status: None   Collection Time: 11/14/14 12:05 AM  Result Value Ref Range Status   Specimen Description BLOOD LEFT ARM  Final   Special Requests BOTTLES DRAWN AEROBIC AND ANAEROBIC 5CC  Final   Culture NO GROWTH 5 DAYS  Final   Report Status 11/19/2014 FINAL  Final  MRSA PCR Screening     Status: None   Collection Time: 11/14/14  3:05 AM  Result Value Ref Range Status   MRSA by PCR NEGATIVE NEGATIVE Final    Comment:        The GeneXpert MRSA Assay (FDA approved for NASAL specimens only), is one component of a comprehensive MRSA colonization surveillance program. It is not intended to diagnose MRSA infection nor to guide or monitor treatment for MRSA infections.   Urine culture     Status: None   Collection Time: 11/15/14  1:16 AM  Result Value Ref Range Status   Specimen Description URINE, RANDOM  Final   Special Requests NONE  Final   Culture NO GROWTH 1 DAY  Final   Report Status 11/16/2014 FINAL  Final    Radiology Reports Ct Abdomen Pelvis Wo Contrast  11/14/2014  ADDENDUM REPORT: 11/14/2014 08:10 ADDENDUM: Study discussed by telephone with Dr. Ree Kida On 11/14/2014 at 0800 hours. Electronically Signed   By: Genevie Ann M.D.   On: 11/14/2014 08:10  11/14/2014  CLINICAL DATA:  64 year old male with constant right side abdominal pain. Retroperitoneal hemorrhage in August. Subsequent encounter. EXAM: CT ABDOMEN AND PELVIS WITHOUT CONTRAST TECHNIQUE:  Multidetector CT imaging of the abdomen and pelvis was performed following the standard protocol without IV contrast. COMPARISON:  09/15/2014 and earlier. FINDINGS: Increased right pleural effusion,  now almost completely opacifying the visible right lung base. On this noncontrast exam there are multiple round and nodular intermediate density foci within the right pleural space, individually up to 4 cm (series 2, image 19), new since August. The right lower lobe is partially collapsed and its airways partially filled with fluid ( "Drowned lung" ). There are parenchymal calcifications suggesting granulomas. Cardiomegaly with no pericardial effusion. No left pleural effusion and left lower lobe ventilation has improved. There are displaced lateral right eighth and ninth rib fractures which are new since August (series 3). These are in proximity to the intermediate density material in the right pleural space. Lower thoracic and lumbar vertebrae appear intact. Bilateral SI joint ankylosis. Degenerative changes at the hips. Proximal left femur orthopedic hardware. No other acute osseous abnormality identified. Bilateral flank anasarca has increased and now tracks to the pelvis. The large right retroperitoneal hemorrhage has decreased but not resolved, now encompassing 12 x 15.5 x 14 cm (AP by transverse by CC) versus 17 x 19 x 24 cm previously. Layering hematocrit again noted. The right psoas muscle is less abnormal now. Regional mass effect including anterior displacement of the right kidney is stable to mildly regressed. Interval regressed free fluid in the abdomen. Stable presacral stranding. Stable distal colon with intermittent retained stool. There is no longer gas in the urinary bladder. Diverticulosis of the sigmoid colon with no definite active inflammation. Mild gaseous distension of the left colon, transverse colon and right colon. Oral contrast has not reached the distal small bowel. No dilated or abnormal  small bowel loops. Fat containing umbilical hernia. Negative stomach and duodenum. The gallbladder is mildly distended, no pericholecystic stranding. Stable noncontrast liver parenchyma, spleen, pancreas, adrenal glands, and kidneys. Aortoiliac calcified atherosclerosis noted. Stable maximal celiac axis lymph nodes up to 14 mm. IMPRESSION: 1. Large volume right hemothorax with new displaced right lateral eighth and ninth rib fractures. Complete atelectasis of the visible right lung. 2. Regressed but not resolved large right retroperitoneal hemorrhage since August. Residual hemorrhage volume approximates 1,302 mL. 3. No new abnormality in the abdomen or pelvis. Electronically Signed: By: Genevie Ann M.D. On: 11/14/2014 07:52   Ct Head Wo Contrast  11/14/2014  CLINICAL DATA:  Altered mental status. EXAM: CT HEAD WITHOUT CONTRAST TECHNIQUE: Contiguous axial images were obtained from the base of the skull through the vertex without intravenous contrast. COMPARISON:  08/25/2014, 02/01/2014 FINDINGS: There is no intracranial hemorrhage or extra-axial fluid collection. There is no evidence of acute infarction. There is encephalomalacia due to remote right posterior MCA infarction. There is mild generalized atrophy. There is mild periventricular hypodensity consistent with chronic small vessel disease. Prominent pituitary soft tissues are consistent with the previously described adenoma. Visible paranasal sinuses are clear. IMPRESSION: 1. Remote right MCA infarction. 2. Unchanged pituitary adenoma. 3. Mild generalized atrophy and chronic small vessel disease. 4. No acute findings Electronically Signed   By: Andreas Newport M.D.   On: 11/14/2014 00:53   Dg Chest Port 1 View  11/20/2014  CLINICAL DATA:  Hemo pneumothorax.  Chest tube removal. EXAM: PORTABLE CHEST 1 VIEW COMPARISON:  11/19/2014. FINDINGS: Interim removal of right chest tube. Dual-lumen right IJ catheter stable position. Cardiac pacer stable position .  Cardiomegaly. No pulmonary venous congestion. Right lower lobe infiltrate and right pleural effusion. No pneumothorax noted on today's exam. IMPRESSION: 1. Interim removal of right chest tube. No pneumothorax noted on today's exam. Right IJ line in stable position. 2. Cardiac pacer stable position.  Stable  cardiomegaly. 3. Developing right lower lobe infiltrate and right pleural effusion. Electronically Signed   By: Marcello Moores  Register   On: 11/20/2014 07:47   Dg Chest Port 1 View  11/19/2014  CLINICAL DATA:  Traumatic hemo pneumothorax.  Right chest tube. EXAM: PORTABLE CHEST 1 VIEW COMPARISON:  11/27/2014. FINDINGS: Right chest tube and dual-lumen right IJ catheter in stable position. Small pneumothorax noted on today's exam. Persistent right lower lobe atelectasis. Cardiac pacer stable position. Cardiomegaly with normal pulmonary vascularity. IMPRESSION: 1. Right chest tube and dual lumen IJ catheter in stable position. There is a small pneumothorax noted on today's exam. 2. Persistent right base atelectasis. 3. Cardiac pacer stable position. Stable cardiomegaly. No pulmonary venous congestion. Critical Value/emergent results were called by telephone at the time of interpretation on 11/19/2014 at 7:40 am to nurse Alvis Lemmings, who verbally acknowledged these results. Electronically Signed   By: Marcello Moores  Register   On: 11/19/2014 07:42   Dg Chest Port 1 View  11/17/2014  CLINICAL DATA:  Follow-up hemopneumothorax on the right. EXAM: PORTABLE CHEST 1 VIEW COMPARISON:  11/16/2014 FINDINGS: Pacemaker AICD appears unchanged. Left chest is clear. Dual lumen catheter on the right has its tip at the SVC RA junction. Right chest tubes in place. I do not see a right pneumothorax. Atelectasis in the right lower lung persists. IMPRESSION: No pneumothorax visible on the right on today's study. Persistent atelectasis in the right lower lung. Electronically Signed   By: Nelson Chimes M.D.   On: 11/17/2014 08:54   Dg Chest Port 1  View  11/16/2014  CLINICAL DATA:  History of right rib fractures with hemothorax. EXAM: PORTABLE CHEST 1 VIEW COMPARISON:  11/15/2014 FINDINGS: Right dual lumen central venous catheter and dual lead AICD are stable. Right chest tube is also noted. Previously seen right apical pneumothorax is stable in size. There is haziness of the lung parenchyma in the right lower lobe, and possibly residual right hemothorax. Left lung is clear. Cardiomediastinal silhouette is stably enlarged. Mediastinal contours appear intact. Right-sided rib fractures again noted. Soft tissues are grossly normal. IMPRESSION: Stable residual right apical pneumothorax. Right lower lobe airspace opacity which may represent consolidation, or atelectasis. Probable small residual right hemothorax. Stable enlargement of the cardiac silhouette. Electronically Signed   By: Fidela Salisbury M.D.   On: 11/16/2014 12:28   Dg Chest Port 1 View  11/15/2014  CLINICAL DATA:  Traumatic hemopneumothorax EXAM: PORTABLE CHEST 1 VIEW COMPARISON:  November 14, 2014 FINDINGS: Chest tube is present on the right. There is a minimal right apical pneumothorax. There is persistent pleural effusion with patchy airspace consolidation in the mid and lower lung zones on the right. Left lung is clear except for mild left base atelectasis. Heart is enlarged with pulmonary vascularity within normal limits. Pacemaker leads are attached to the right atrium and right ventricle. Central catheter tip is in the right atrium slightly beyond the cavoatrial junction. There is again noted a displaced right lateral eighth rib fracture. IMPRESSION: Minimal right apical pneumothorax. Right effusion as well as airspace consolidation in the right mid and lower lung zones is present. Displaced right eighth rib fracture again noted. Left lung clear except for mild left base atelectasis. Stable cardiomegaly. Electronically Signed   By: Lowella Grip III M.D.   On: 11/15/2014 08:00   Dg  Chest Port 1 View  11/14/2014  CLINICAL DATA:  64 year old male right side chest tube placement for hemothorax. Initial encounter. EXAM: PORTABLE CHEST 1 VIEW COMPARISON:  CT Abdomen  and Pelvis 0733 hours today, and earlier FINDINGS: Portable AP view at 1611 hours. New right chest tube placed. Significantly reduced right pleural effusion/hemothorax volume. Improved right lung ventilation. No pneumothorax identified. Displaced right eighth rib fracture re- identified. Stable cardiomegaly and mediastinal contours. Stable right IJ approach dual lumen dialysis type catheter. Stable left chest cardiac pacemaker. Residual right lung base atelectasis. IMPRESSION: Regressed right pleural effusion/ hemothorax and improved right lung ventilation status post right chest tube placement. No pneumothorax identified. Electronically Signed   By: Genevie Ann M.D.   On: 11/14/2014 16:19   Dg Chest Port 1 View  11/13/2014  CLINICAL DATA:  Short of breath EXAM: PORTABLE CHEST 1 VIEW COMPARISON:  09/04/2014 FINDINGS: Tunneled right internal jugular dialysis catheter in place. Tip is at the cavoatrial junction. Right PICC is been removed. Left subclavian AICD device and leads are stable. Large right pleural effusion has developed. This obscures the right lower lung. IMPRESSION: New large right pleural effusion. Electronically Signed   By: Marybelle Killings M.D.   On: 11/13/2014 23:43    CBC  Recent Labs Lab 11/17/14 0755 11/17/14 2021 11/18/14 0406 11/19/14 0423 11/20/14 0540  WBC 6.3 6.4 5.7 5.7 6.6  HGB 9.0* 9.4* 8.9* 9.0* 9.0*  HCT 29.3* 30.3* 28.9* 28.6* 28.5*  PLT 162 162 161 129* 140*  MCV 102.4* 102.0* 101.0* 103.6* 102.5*  MCH 31.5 31.6 31.1 32.6 32.4  MCHC 30.7 31.0 30.8 31.5 31.6  RDW 20.1* 20.0* 20.0* 20.3* 20.0*  LYMPHSABS  --   --   --   --  0.8  MONOABS  --   --   --   --  0.9  EOSABS  --   --   --   --  0.6  BASOSABS  --   --   --   --  0.1    Chemistries   Recent Labs Lab 11/15/14 0409   11/17/14 0755 11/17/14 2021 11/18/14 0406 11/19/14 0423 11/20/14 0540 11/20/14 0810  NA 127*  < > 129* 128* 131* 130* 130* 131*  K 4.8  < > 3.8 4.6 4.3 4.0 4.0 4.0  CL 91*  < > 93* 93* 96* 95* 93* 94*  CO2 24  < > 27 23 22 26 22 23   GLUCOSE 114*  < > 102* 115* 88 99 103* 127*  BUN 22*  < > 14 20 22* 16 24* 23*  CREATININE 3.78*  < > 3.37* 3.81* 4.10* 3.61* 4.34* 4.42*  CALCIUM 8.2*  < > 8.1* 8.0* 8.3* 7.9* 8.6* 8.2*  AST 167*  --  49*  --   --  30  --   --   ALT 175*  --  87*  --   --  52  --   --   ALKPHOS 94  --  81  --   --  71  --   --   BILITOT 1.0  --  0.8  --   --  0.7  --   --   < > = values in this interval not displayed. ------------------------------------------------------------------------------------------------------------------ estimated creatinine clearance is 20 mL/min (by C-G formula based on Cr of 4.42). ------------------------------------------------------------------------------------------------------------------ No results for input(s): HGBA1C in the last 72 hours. ------------------------------------------------------------------------------------------------------------------ No results for input(s): CHOL, HDL, LDLCALC, TRIG, CHOLHDL, LDLDIRECT in the last 72 hours. ------------------------------------------------------------------------------------------------------------------ No results for input(s): TSH, T4TOTAL, T3FREE, THYROIDAB in the last 72 hours.  Invalid input(s): FREET3 ------------------------------------------------------------------------------------------------------------------ No results for input(s): VITAMINB12, FOLATE, FERRITIN, TIBC, IRON, RETICCTPCT in the last  72 hours.  Coagulation profile  Recent Labs Lab 11/15/14 0409 11/16/14 0355 11/17/14 0755 11/18/14 0406  INR 1.62* 1.41 1.57* 1.48    No results for input(s): DDIMER in the last 72 hours.  Cardiac Enzymes No results for input(s): CKMB, TROPONINI, MYOGLOBIN in the  last 168 hours.  Invalid input(s): CK ------------------------------------------------------------------------------------------------------------------ Invalid input(s): POCBNP    Jaselyn Nahm D.O. on 11/21/2014 at 2:49 PM  Between 7am to 7pm - Pager - [phone]  After 7pm go to [email or web address] - password [email or web address]  And look for the night coverage person covering for me after hours  [facility] Office  [phone]

## 2014-11-21 NOTE — Progress Notes (Signed)
Occupational Therapy Treatment Patient Details Name: Larry [name] MRN: [id number] DOB: 23-Mar-1950 Today's Date: 11/21/2014    History of present illness Larry [name] is a 64 y.o. male admitted from [facility] with encephalopathy and R pleural effusion. He apparently fell 3x while at [facility] and also has 2right rib fx. Pt with recent admit July-Sept after left hip fx s/p IM nail, pacemaker, initiation of HD due to ESRD. PMHx NICM, CAD, DDD, back pain, CVA, obesity.    OT comments  Focus of session included toilet transfer, LB dressing using adaptive equipment and functional mobility to maximize safety with ADLs. Pt able to ambulate ~38feet to simulate at home functional mobility for ADLs, prior to requiring a seated rest break due to onset of anxiety and pain in Lt LE. Pt progressing towards OT goals will continue to follow acutely.   Follow Up Recommendations  SNF    Equipment Recommendations  Other (comment) TBD next venue   Recommendations for Other Services      Precautions / Restrictions Precautions Precautions: Fall       Mobility Bed Mobility               General bed mobility comments: Pt in chair upon arrival  Transfers Overall transfer level: Needs assistance Equipment used: Rolling walker (2 wheeled) Transfers: Sit to/from Stand Sit to Stand: Min guard         General transfer comment: min guard for safety    Balance Overall balance assessment: Needs assistance Sitting-balance support: No upper extremity supported;Feet supported Sitting balance-Leahy Scale: Good     Standing balance support: Bilateral upper extremity supported;During functional activity Standing balance-Leahy Scale: Fair                     ADL                       Lower Body Dressing: Maximal assistance;Sitting/lateral leans   Toilet Transfer: Min guard;Ambulation;RW;BSC             General ADL Comments: Pt require assistance for peri-care due to Rt UE  hand having oxygen probe and unable to remove to perform care himself.      Vision                     Perception     Praxis      Cognition   Behavior During Therapy: WFL for tasks assessed/performed Overall Cognitive Status: Within Functional Limits for tasks assessed                       Extremity/Trunk Assessment               Exercises     Shoulder Instructions       General Comments      Pertinent Vitals/ Pain       Pain Assessment: 0-10 Pain Score: 7  Pain Location: back and Lt foot Pain Descriptors / Indicators: Constant Pain Intervention(s): Monitored during session;Repositioned  Home Living                                          Prior Functioning/Environment              Frequency Min 2X/week     Progress Toward Goals  OT Goals(current goals can now be  found in the care plan section)  Progress towards OT goals: Progressing toward goals  Acute Rehab OT Goals Patient Stated Goal: none stated OT Goal Formulation: With patient Potential to Achieve Goals: Good ADL Goals Pt Will Perform Lower Body Bathing: with adaptive equipment;sit to/from stand;with mod assist Pt Will Perform Lower Body Dressing: with mod assist;with adaptive equipment;sit to/from stand Pt Will Transfer to Toilet: with mod assist;stand pivot transfer;bedside commode Pt Will Perform Toileting - Clothing Manipulation and hygiene: with min guard assist;sitting/lateral leans Pt Will Perform Tub/Shower Transfer: with mod assist;Stand pivot transfer;3 in 1;rolling walker Additional ADL Goal #1: Pt will be mod A for bed mobility for precursor for OOB ADLs  Plan Discharge plan remains appropriate    Co-evaluation                 End of Session Equipment Utilized During Treatment: Gait belt;Rolling walker   Activity Tolerance Patient tolerated treatment well   Patient Left in chair;with call bell/phone within reach;with  family/visitor present   Nurse Communication          Time: 1030-1107 OT Time Calculation (min): 37 min  Charges: OT General Charges $OT Visit: 1 Procedure OT Treatments $Self Care/Home Management : 23-37 mins  Lin Landsman 11/21/2014, 12:50 PM

## 2014-11-22 LAB — CBC
HCT: 27.6 % — ABNORMAL LOW (ref 39.0–52.0)
Hemoglobin: 8.6 g/dL — ABNORMAL LOW (ref 13.0–17.0)
MCH: 31.6 pg (ref 26.0–34.0)
MCHC: 31.2 g/dL (ref 30.0–36.0)
MCV: 101.5 fL — AB (ref 78.0–100.0)
PLATELETS: 203 10*3/uL (ref 150–400)
RBC: 2.72 MIL/uL — AB (ref 4.22–5.81)
RDW: 19.5 % — AB (ref 11.5–15.5)
WBC: 7.2 10*3/uL (ref 4.0–10.5)

## 2014-11-22 LAB — RENAL FUNCTION PANEL
Albumin: 2.2 g/dL — ABNORMAL LOW (ref 3.5–5.0)
Anion gap: 7 (ref 5–15)
BUN: 21 mg/dL — ABNORMAL HIGH (ref 6–20)
CO2: 29 mmol/L (ref 22–32)
Calcium: 8.1 mg/dL — ABNORMAL LOW (ref 8.9–10.3)
Chloride: 95 mmol/L — ABNORMAL LOW (ref 101–111)
Creatinine, Ser: 4.12 mg/dL — ABNORMAL HIGH (ref 0.61–1.24)
GFR calc Af Amer: 16 mL/min — ABNORMAL LOW (ref >60)
GFR calc non Af Amer: 14 mL/min — ABNORMAL LOW (ref >60)
GLUCOSE: 103 mg/dL — AB (ref 65–99)
PHOSPHORUS: 4.7 mg/dL — AB (ref 2.5–4.6)
POTASSIUM: 3.6 mmol/L (ref 3.5–5.1)
Sodium: 131 mmol/L — ABNORMAL LOW (ref 135–145)

## 2014-11-22 LAB — GLUCOSE, CAPILLARY
GLUCOSE-CAPILLARY: 74 mg/dL (ref 65–99)
Glucose-Capillary: 107 mg/dL — ABNORMAL HIGH (ref 65–99)

## 2014-11-22 MED ORDER — HEPARIN SODIUM (PORCINE) 1000 UNIT/ML DIALYSIS
1000.0000 [IU] | INTRAMUSCULAR | Status: DC | PRN
  Filled 2014-11-22: qty 1

## 2014-11-22 MED ORDER — LIDOCAINE HCL (PF) 1 % IJ SOLN: 5.0000 mL | INTRAMUSCULAR | Status: DC | PRN

## 2014-11-22 MED ORDER — PENTAFLUOROPROP-TETRAFLUOROETH EX AERO: 1.0000 "application " | INHALATION_SPRAY | CUTANEOUS | Status: DC | PRN

## 2014-11-22 MED ORDER — INSULIN GLARGINE 100 UNITS/ML SOLOSTAR PEN: 10.0000 [IU] | PEN_INJECTOR | Freq: Every day | SUBCUTANEOUS | Status: AC

## 2014-11-22 MED ORDER — SODIUM CHLORIDE 0.9 % IV SOLN: 100.0000 mL | INTRAVENOUS | Status: DC | PRN

## 2014-11-22 MED ORDER — ALTEPLASE 2 MG IJ SOLR
2.0000 mg | Freq: Once | INTRAMUSCULAR | Status: DC | PRN
  Filled 2014-11-22: qty 2

## 2014-11-22 MED ORDER — LIDOCAINE-PRILOCAINE 2.5-2.5 % EX CREA
1.0000 "application " | TOPICAL_CREAM | CUTANEOUS | Status: DC | PRN
  Filled 2014-11-22: qty 5

## 2014-11-22 MED ORDER — MIDODRINE HCL 5 MG PO TABS
ORAL_TABLET | ORAL | Status: AC
  Administered 2014-11-22: 10 mg via ORAL
  Filled 2014-11-22: qty 2

## 2014-11-22 NOTE — Discharge Summary (Signed)
Physician Discharge Summary  Larry [name]:[id number] DOB: [birth year] DOA: 11/13/2014  PCP: [name], MD  Admit date: 11/13/2014 Discharge date: 11/22/2014  Time spent: > 35 minutes  Recommendations for Outpatient Follow-up:  1. Patient taken off of coumadin due to recent pneumothorax. Please decide whether or not to continue anticoagulation medication with improvement in condition.   Discharge Diagnoses:  Principal Problem:   Acute encephalopathy Active Problems:   Essential hypertension, benign   Automatic implantable cardioverter-defibrillator in situ   DM2 (diabetes mellitus, type 2) (HCC)   Depression with anxiety   OSA (obstructive sleep apnea), intolerant to CPAP/BIPAP   Antiphospholipid antibody positive   Chronic systolic CHF (congestive heart failure) ([place])   Encounter for palliative care   ESRD (end stage renal disease) on dialysis (HCC)   Abdominal pain   Foot lesion   Pleural effusion, right   Absolute anemia   Rib fractures   Hemothorax, right   Traumatic hemopneumothorax   Discharge Condition: stable  Diet recommendation: renal/carb modified diet  Filed Weights   11/21/14 0602 11/22/14 0820 11/22/14 1245  Weight: 94.1 kg (207 lb 7.3 oz) 95.7 kg (210 lb 15.7 oz) 94.8 kg (208 lb 15.9 oz)    History of present illness:  From original HPI: 64 y.o. 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  The patient's wife, patient has been mildly confused in the past 7 days. He has mild cough and shortness of breath, but no chest pain, fever or chills. Patient complains of abdominal pain. It is located in the right  abdomen, constant, mild, radiating to the right shoulder, which has been going on for few weeks after he had a fall. He is constipated, no nausea, vomiting, diarrhea. He does not have symptoms of UTI. Patient done does not seem to have unilateral weakness, vision changes, hearing loss.  In ED, patient was found to have WBC 6.0, temperature normal, no tachycardia, creatinine 2.50, BUN 15, potassium 4.6, lactate of 1.62, INR 2.94. Chest x-ray showed new large right pleural effusion. CT-head showed remote right MCA infarction, unchanged pituitary adenoma, mild generalized atrophy and chronic small vessel disease, no acute findings  Hospital Course:  Right Rib Fractures with hemothorax -Secondary to falls -Seen on CT abdomen -Trauma surgery consulted and appreciated, chest tube inserted and removed 11/22/14  -Coumadin discontinued - PT on board and recommending SNF  Spontaneous pneumothorax -CXR: small PTX  - Will monitor 24 hours after removal of chest tube.  Acute encephalopathy -Resolved, Likely secondary to the above -CT head: Reported no acute findings -Currently afebrile, no leukocytosis, however patient started on vanc/zosyn, narrow to augmentin, consulted pharmacy for appropriate renal dose. -Xanax and Temazepam held -PT recommended SNF  Essential hypertension -Blood pressure is soft on admission. -continue midodrine  Macrocytic Anemia -Hb 8.6, drop likely secondary to hemothorax/hemorrhage -Baseline Hb 11  Diabetes mellitus, type 2 -Last A1c 7.7 on 06/26/14 -Continue lantus, ISS, CBG monitoring  Depression with anxiety: -continue cymbalta - xanax prn  Atrial Fibrillation -CHADSVASC 5 -Currently rate controlled -Was on coumadin, however, discontinued due to hemothorax and continued right retroperitoneal hemorrhage. -Continue amiodarone -Cardiology consulted and appreciated, agreed with holding coumadin until bleeding stops. They have recommended to restart  anticoagulation as soon as  practical  Antiphospholipid antibody -coumadin discontinued due to bleed with right retroperitoneal hemorrhage, traumatic pneumothorax after fall -Per Wife, patient has not had PE or DVT  Chronic systolic CHF (congestive heart failure) -Echocardiogram on 02/02/14 showed EF 20-25%.  -Currently compensated -Managed by dialysis  ESRD (end stage renal disease) on dialysis ([facility]) -Nephrology consulted and managed while patient in house  Abdominal pain/ Large Right Retroperitoneal hemorrhage/ Elevated transaminases -CT abdomen/pelvis: no new abnormality  -Lipase 20 -AST/ALT improving- continue to monitor  Consultations:  Nephrology  Cardiology  Trauma  Discharge Exam: Filed Vitals:   11/22/14 1245  BP: 93/61  Pulse: 78  Temp: 98.6 F (37 C)  Resp: 20    General: Pt in nad, alert and awake Cardiovascular: rrr, no mrg Respiratory: cta bl, no wheezes  Discharge Instructions   Discharge Instructions    Call MD for:  difficulty breathing, headache or visual disturbances    Complete by:  As directed      Call MD for:  redness, tenderness, or signs of infection (pain, swelling, redness, odor or green/yellow discharge around incision site)    Complete by:  As directed      Call MD for:  temperature >100.4    Complete by:  As directed      Diet - low sodium heart healthy    Complete by:  As directed      Discharge instructions    Complete by:  As directed   Please follow up with your primary care physician in 1-2 weeks or sooner should any new concerns arise. The coumadin has been discontinued due to falls and your recent pneumothorax requiring chest tube placement.     Increase activity slowly    Complete by:  As directed           Current Discharge Medication List    CONTINUE these medications which have CHANGED   Details  insulin glargine (LANTUS) 100 unit/mL SOPN Inject 0.1 mLs (10 Units total) into the skin at bedtime. Qty: 15 mL,  Refills: 11      CONTINUE these medications which have NOT CHANGED   Details  acetaminophen (TYLENOL) 325 MG tablet Take 650 mg by mouth every 4 (four) hours as needed for moderate pain or fever.    ALPRAZolam (XANAX) 0.5 MG tablet Take 0.5 mg by mouth every 8 (eight) hours as needed for anxiety.    amiodarone (PACERONE) 400 MG tablet Take 0.5 tablets (200 mg total) by mouth daily. Qty: 60 tablet, Refills: 6    atorvastatin (LIPITOR) 40 MG tablet TAKE 1 TABLET DAILY Qty: 90 tablet, Refills: 3    chlorhexidine (PERIDEX) 0.12 % solution Perform mouth rinses twice daily after breakfast and at bedtime. Qty: 120 mL, Refills: 0    DULoxetine (CYMBALTA) 30 MG capsule Take 60 mg by mouth 2 (two) times daily.     insulin lispro (HUMALOG KWIKPEN) 100 UNIT/ML KiwkPen Inject 5-25 Units into the skin 3 (three) times daily as needed.     levothyroxine (SYNTHROID, LEVOTHROID) 75 MCG tablet TAKE 1 TABLET ON AN EMPTY STOMACH 30 MINUTES BEFORE BREAKFAST Refills: 5    midodrine (PROAMATINE) 10 MG tablet Take 1 tablet (10 mg total) by mouth 3 (three) times daily. Qty: 90 tablet, Refills: 6    multivitamin (RENA-VIT) TABS tablet Take 1 tablet by mouth at bedtime. Qty: 30 tablet, Refills: 6    polyethylene glycol (MIRALAX / GLYCOLAX) packet Take 17 g by mouth daily as needed for moderate constipation. Qty: 14 each,  Refills: 0    sevelamer carbonate (RENVELA) 800 MG tablet Take 800 mg by mouth 3 (three) times daily with meals.    sildenafil (REVATIO) 20 MG tablet Take 4 tablets (80 mg total) by mouth 3 (three) times daily. Qty: 360 tablet, Refills: 1    traZODone (DESYREL) 50 MG tablet Take 50 mg by mouth at bedtime.    bisacodyl (DULCOLAX) 10 MG suppository Place 1 suppository (10 mg total) rectally daily as needed for moderate constipation. Qty: 12 suppository, Refills: 0    ondansetron (ZOFRAN) 4 MG tablet Take 1 tablet (4 mg total) by mouth every 8 (eight) hours as needed for nausea or  vomiting. Qty: 30 tablet, Refills: 0    oxyCODONE-acetaminophen (PERCOCET) 10-325 MG per tablet Take 1 tablet by mouth every 4 (four) hours as needed for pain. Qty: 120 tablet, Refills: 0      STOP taking these medications     bisacodyl (DULCOLAX) 5 MG EC tablet      bisacodyl (DULCOLAX) 5 MG EC tablet      warfarin (COUMADIN) 5 MG tablet      temazepam (RESTORIL) 7.5 MG capsule        Allergies  Allergen Reactions  . Bydureon [Exenatide] Other (See Comments)    sweating  . Losartan Potassium Other (See Comments)    insomnia  . Gabapentin Other (See Comments)    confusion      The results of significant diagnostics from this hospitalization (including imaging, microbiology, ancillary and laboratory) are listed below for reference.    Significant Diagnostic Studies: Ct Abdomen Pelvis Wo Contrast  11/14/2014  ADDENDUM REPORT: 11/14/2014 08:10 ADDENDUM: Study discussed by telephone with Dr. Ree Kida On 11/14/2014 at 0800 hours. Electronically Signed   By: Genevie Ann M.D.   On: 11/14/2014 08:10  11/14/2014  CLINICAL DATA:  64 year old male with constant right side abdominal pain. Retroperitoneal hemorrhage in August. Subsequent encounter. EXAM: CT ABDOMEN AND PELVIS WITHOUT CONTRAST TECHNIQUE: Multidetector CT imaging of the abdomen and pelvis was performed following the standard protocol without IV contrast. COMPARISON:  09/15/2014 and earlier. FINDINGS: Increased right pleural effusion, now almost completely opacifying the visible right lung base. On this noncontrast exam there are multiple round and nodular intermediate density foci within the right pleural space, individually up to 4 cm (series 2, image 19), new since August. The right lower lobe is partially collapsed and its airways partially filled with fluid ( "Drowned lung" ). There are parenchymal calcifications suggesting granulomas. Cardiomegaly with no pericardial effusion. No left pleural effusion and left lower lobe  ventilation has improved. There are displaced lateral right eighth and ninth rib fractures which are new since August (series 3). These are in proximity to the intermediate density material in the right pleural space. Lower thoracic and lumbar vertebrae appear intact. Bilateral SI joint ankylosis. Degenerative changes at the hips. Proximal left femur orthopedic hardware. No other acute osseous abnormality identified. Bilateral flank anasarca has increased and now tracks to the pelvis. The large right retroperitoneal hemorrhage has decreased but not resolved, now encompassing 12 x 15.5 x 14 cm (AP by transverse by CC) versus 17 x 19 x 24 cm previously. Layering hematocrit again noted. The right psoas muscle is less abnormal now. Regional mass effect including anterior displacement of the right kidney is stable to mildly regressed. Interval regressed free fluid in the abdomen. Stable presacral stranding. Stable distal colon with intermittent retained stool. There is no longer gas in the urinary bladder. Diverticulosis of the  sigmoid colon with no definite active inflammation. Mild gaseous distension of the left colon, transverse colon and right colon. Oral contrast has not reached the distal small bowel. No dilated or abnormal small bowel loops. Fat containing umbilical hernia. Negative stomach and duodenum. The gallbladder is mildly distended, no pericholecystic stranding. Stable noncontrast liver parenchyma, spleen, pancreas, adrenal glands, and kidneys. Aortoiliac calcified atherosclerosis noted. Stable maximal celiac axis lymph nodes up to 14 mm. IMPRESSION: 1. Large volume right hemothorax with new displaced right lateral eighth and ninth rib fractures. Complete atelectasis of the visible right lung. 2. Regressed but not resolved large right retroperitoneal hemorrhage since August. Residual hemorrhage volume approximates 1,302 mL. 3. No new abnormality in the abdomen or pelvis. Electronically Signed: By: Genevie Ann  M.D. On: 11/14/2014 07:52   Ct Head Wo Contrast  11/14/2014  CLINICAL DATA:  Altered mental status. EXAM: CT HEAD WITHOUT CONTRAST TECHNIQUE: Contiguous axial images were obtained from the base of the skull through the vertex without intravenous contrast. COMPARISON:  08/25/2014, 02/01/2014 FINDINGS: There is no intracranial hemorrhage or extra-axial fluid collection. There is no evidence of acute infarction. There is encephalomalacia due to remote right posterior MCA infarction. There is mild generalized atrophy. There is mild periventricular hypodensity consistent with chronic small vessel disease. Prominent pituitary soft tissues are consistent with the previously described adenoma. Visible paranasal sinuses are clear. IMPRESSION: 1. Remote right MCA infarction. 2. Unchanged pituitary adenoma. 3. Mild generalized atrophy and chronic small vessel disease. 4. No acute findings Electronically Signed   By: Andreas Newport M.D.   On: 11/14/2014 00:53   Dg Chest Port 1 View  11/20/2014  CLINICAL DATA:  Hemo pneumothorax.  Chest tube removal. EXAM: PORTABLE CHEST 1 VIEW COMPARISON:  11/19/2014. FINDINGS: Interim removal of right chest tube. Dual-lumen right IJ catheter stable position. Cardiac pacer stable position . Cardiomegaly. No pulmonary venous congestion. Right lower lobe infiltrate and right pleural effusion. No pneumothorax noted on today's exam. IMPRESSION: 1. Interim removal of right chest tube. No pneumothorax noted on today's exam. Right IJ line in stable position. 2. Cardiac pacer stable position.  Stable cardiomegaly. 3. Developing right lower lobe infiltrate and right pleural effusion. Electronically Signed   By: Marcello Moores  Register   On: 11/20/2014 07:47   Dg Chest Port 1 View  11/19/2014  CLINICAL DATA:  Traumatic hemo pneumothorax.  Right chest tube. EXAM: PORTABLE CHEST 1 VIEW COMPARISON:  11/27/2014. FINDINGS: Right chest tube and dual-lumen right IJ catheter in stable position. Small  pneumothorax noted on today's exam. Persistent right lower lobe atelectasis. Cardiac pacer stable position. Cardiomegaly with normal pulmonary vascularity. IMPRESSION: 1. Right chest tube and dual lumen IJ catheter in stable position. There is a small pneumothorax noted on today's exam. 2. Persistent right base atelectasis. 3. Cardiac pacer stable position. Stable cardiomegaly. No pulmonary venous congestion. Critical Value/emergent results were called by telephone at the time of interpretation on 11/19/2014 at 7:40 am to nurse Alvis Lemmings, who verbally acknowledged these results. Electronically Signed   By: Marcello Moores  Register   On: 11/19/2014 07:42   Dg Chest Port 1 View  11/17/2014  CLINICAL DATA:  Follow-up hemopneumothorax on the right. EXAM: PORTABLE CHEST 1 VIEW COMPARISON:  11/16/2014 FINDINGS: Pacemaker AICD appears unchanged. Left chest is clear. Dual lumen catheter on the right has its tip at the SVC RA junction. Right chest tubes in place. I do not see a right pneumothorax. Atelectasis in the right lower lung persists. IMPRESSION: No pneumothorax visible on the right  on today's study. Persistent atelectasis in the right lower lung. Electronically Signed   By: Nelson Chimes M.D.   On: 11/17/2014 08:54   Dg Chest Port 1 View  11/16/2014  CLINICAL DATA:  History of right rib fractures with hemothorax. EXAM: PORTABLE CHEST 1 VIEW COMPARISON:  11/15/2014 FINDINGS: Right dual lumen central venous catheter and dual lead AICD are stable. Right chest tube is also noted. Previously seen right apical pneumothorax is stable in size. There is haziness of the lung parenchyma in the right lower lobe, and possibly residual right hemothorax. Left lung is clear. Cardiomediastinal silhouette is stably enlarged. Mediastinal contours appear intact. Right-sided rib fractures again noted. Soft tissues are grossly normal. IMPRESSION: Stable residual right apical pneumothorax. Right lower lobe airspace opacity which may represent  consolidation, or atelectasis. Probable small residual right hemothorax. Stable enlargement of the cardiac silhouette. Electronically Signed   By: Fidela Salisbury M.D.   On: 11/16/2014 12:28   Dg Chest Port 1 View  11/15/2014  CLINICAL DATA:  Traumatic hemopneumothorax EXAM: PORTABLE CHEST 1 VIEW COMPARISON:  November 14, 2014 FINDINGS: Chest tube is present on the right. There is a minimal right apical pneumothorax. There is persistent pleural effusion with patchy airspace consolidation in the mid and lower lung zones on the right. Left lung is clear except for mild left base atelectasis. Heart is enlarged with pulmonary vascularity within normal limits. Pacemaker leads are attached to the right atrium and right ventricle. Central catheter tip is in the right atrium slightly beyond the cavoatrial junction. There is again noted a displaced right lateral eighth rib fracture. IMPRESSION: Minimal right apical pneumothorax. Right effusion as well as airspace consolidation in the right mid and lower lung zones is present. Displaced right eighth rib fracture again noted. Left lung clear except for mild left base atelectasis. Stable cardiomegaly. Electronically Signed   By: Lowella Grip III M.D.   On: 11/15/2014 08:00   Dg Chest Port 1 View  11/14/2014  CLINICAL DATA:  64 year old male right side chest tube placement for hemothorax. Initial encounter. EXAM: PORTABLE CHEST 1 VIEW COMPARISON:  CT Abdomen and Pelvis 0733 hours today, and earlier FINDINGS: Portable AP view at 1611 hours. New right chest tube placed. Significantly reduced right pleural effusion/hemothorax volume. Improved right lung ventilation. No pneumothorax identified. Displaced right eighth rib fracture re- identified. Stable cardiomegaly and mediastinal contours. Stable right IJ approach dual lumen dialysis type catheter. Stable left chest cardiac pacemaker. Residual right lung base atelectasis. IMPRESSION: Regressed right pleural effusion/  hemothorax and improved right lung ventilation status post right chest tube placement. No pneumothorax identified. Electronically Signed   By: Genevie Ann M.D.   On: 11/14/2014 16:19   Dg Chest Port 1 View  11/13/2014  CLINICAL DATA:  Short of breath EXAM: PORTABLE CHEST 1 VIEW COMPARISON:  09/04/2014 FINDINGS: Tunneled right internal jugular dialysis catheter in place. Tip is at the cavoatrial junction. Right PICC is been removed. Left subclavian AICD device and leads are stable. Large right pleural effusion has developed. This obscures the right lower lung. IMPRESSION: New large right pleural effusion. Electronically Signed   By: Marybelle Killings M.D.   On: 11/13/2014 23:43    Microbiology: Recent Results (from the past 240 hour(s))  Culture, blood (routine x 2)     Status: None   Collection Time: 11/13/14 11:48 PM  Result Value Ref Range Status   Specimen Description BLOOD LEFT FOREARM  Final   Special Requests BOTTLES DRAWN AEROBIC ONLY 3CC  Final   Culture NO GROWTH 5 DAYS  Final   Report Status 11/19/2014 FINAL  Final  Culture, blood (routine x 2)     Status: None   Collection Time: 11/14/14 12:05 AM  Result Value Ref Range Status   Specimen Description BLOOD LEFT ARM  Final   Special Requests BOTTLES DRAWN AEROBIC AND ANAEROBIC 5CC  Final   Culture NO GROWTH 5 DAYS  Final   Report Status 11/19/2014 FINAL  Final  MRSA PCR Screening     Status: None   Collection Time: 11/14/14  3:05 AM  Result Value Ref Range Status   MRSA by PCR NEGATIVE NEGATIVE Final    Comment:        The GeneXpert MRSA Assay (FDA approved for NASAL specimens only), is one component of a comprehensive MRSA colonization surveillance program. It is not intended to diagnose MRSA infection nor to guide or monitor treatment for MRSA infections.   Urine culture     Status: None   Collection Time: 11/15/14  1:16 AM  Result Value Ref Range Status   Specimen Description URINE, RANDOM  Final   Special Requests NONE   Final   Culture NO GROWTH 1 DAY  Final   Report Status 11/16/2014 FINAL  Final     Labs: Basic Metabolic Panel:  Recent Labs Lab 11/16/14 0815  11/17/14 2021 11/18/14 0406 11/19/14 0423 11/20/14 0540 11/20/14 0810 11/22/14 0830  NA 130*  < > 128* 131* 130* 130* 131* 131*  K 3.7  < > 4.6 4.3 4.0 4.0 4.0 3.6  CL 96*  < > 93* 96* 95* 93* 94* 95*  CO2 26  < > 23 22 26 22 23 29   GLUCOSE 103*  < > 115* 88 99 103* 127* 103*  BUN 16  < > 20 22* 16 24* 23* 21*  CREATININE 3.06*  < > 3.81* 4.10* 3.61* 4.34* 4.42* 4.12*  CALCIUM 8.0*  < > 8.0* 8.3* 7.9* 8.6* 8.2* 8.1*  PHOS 4.4  --  5.3*  --   --   --  5.7* 4.7*  < > = values in this interval not displayed. Liver Function Tests:  Recent Labs Lab 11/17/14 0755 11/17/14 2021 11/19/14 0423 11/20/14 0810 11/22/14 0830  AST 49*  --  30  --   --   ALT 87*  --  52  --   --   ALKPHOS 81  --  71  --   --   BILITOT 0.8  --  0.7  --   --   PROT 6.1*  --  6.0*  --   --   ALBUMIN 2.3* 2.5* 2.2* 2.0* 2.2*   No results for input(s): LIPASE, AMYLASE in the last 168 hours. No results for input(s): AMMONIA in the last 168 hours. CBC:  Recent Labs Lab 11/17/14 2021 11/18/14 0406 11/19/14 0423 11/20/14 0540 11/22/14 0830  WBC 6.4 5.7 5.7 6.6 7.2  NEUTROABS  --   --   --  4.1  --   HGB 9.4* 8.9* 9.0* 9.0* 8.6*  HCT 30.3* 28.9* 28.6* 28.5* 27.6*  MCV 102.0* 101.0* 103.6* 102.5* 101.5*  PLT 162 161 129* 140* 203   Cardiac Enzymes: No results for input(s): CKTOTAL, CKMB, CKMBINDEX, TROPONINI in the last 168 hours. BNP: BNP (last 3 results)  Recent Labs  05/22/14 1029 05/27/14 1126 06/19/14 1030  BNP 935.2* 1627.8* 738.9*    ProBNP (last 3 results)  Recent Labs  01/14/14 1112  PROBNP 5769.0*  CBG:  Recent Labs Lab 11/21/14 0739 11/21/14 1145 11/21/14 1705 11/21/14 2319 11/22/14 0741  GLUCAP 90 95 112* 110* 107*       Signed:  Velvet Bathe  [facility] 11/22/2014, 1:50 PM

## 2014-11-22 NOTE — Progress Notes (Signed)
Report given to [place] at [facility]. Paper sent with family

## 2014-11-22 NOTE — Clinical Social Work Note (Addendum)
Clinical Social Worker facilitated patient discharge including contacting patient family and facility to confirm patient discharge plans.  Clinical information faxed to facility and family agreeable with plan.  Patient's wife plans to transport patient to [place].  RN to call report prior to discharge.  DC packet prepared and on chat.  Clinical Social Worker will sign off for now as social work intervention is no longer needed. Please consult [place] again if new need arises.  NOTE: Patient's wife does NOT wish for patient to be a DNR. CSW has NOT included DNR form in discharge packet.   Glendon Axe, MSW, LCSWA [phone] 11/22/2014 2:21 PM

## 2014-11-22 NOTE — Progress Notes (Signed)
Subjective:   No complaints- seen on HD-  Possible discharge soon?  Clotting system due to no heparin  Objective Filed Vitals:   11/22/14 0930 11/22/14 1000 11/22/14 1029 11/22/14 1100  BP: 89/55 92/48 90/57  93/56  Pulse: 71 73 72 73  Temp:      TempSrc:      Resp:      Height:      Weight:      SpO2:       Physical Exam General:alert and oriented, sitting in chair, no acute distress. Wife at bedside Heart: RRR 2/6  Lungs: CTA, unlabored Abdomen: soft, nontender +BS Extremities: bilat LE edema. Legs wrapped Dialysis Access: R IJ cath. R AVF maturing +b/t  MWFS   4hrs 95kgs 4K/2.25 Ca Cath No heparin due to massive RPH during hosp in August ? P4 micera 50 q 2 weeks- last given9/28  Assessment/Plan: 1. AMS- back to baseline- caution with xanax 2. Rib fractures/ hemothorax- trauma surgery following. chest tube removed.  3. ESRD - MWFS, HD today, and tomorrow.  On no heparin and no coumadin as near as I can tell due to massive [facility] he had in August- may need to restart if lots of continued clotting on machine  4. 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 Has LE edema, will likely need lower dry weight, get vol down some more if BP will tolerate.cont midodrine- edema 5. Anemia - on mircera at OP unit, hgb 8.6/ dosed Aranesp this week 6. Metabolic bone disease - cont renvela, last phos 5.7 and PTH 177 7. Nutrition - MVI. Alb 2. reports appetite much improved  8. Chronic hypotension on midodrine- SBP in the 90s 9. AFib on amiodarone, coumadin stopped due to bleeding events (recent RP bleed, then hemothorax) and hx of falls- on amiodarone 10. Cellulitis- on Vanc 11. Anxiety- gets VERY anxious end of HD.  12. DNR 13. dispo- to SNF- possibly soon  Larry [name] A   11/22/2014,11:14 AM  LOS: 8 days    Additional Objective Labs: Basic Metabolic Panel:  Recent Labs Lab 11/17/14 2021  11/20/14 0540 11/20/14 0810  11/22/14 0830  NA 128*  < > 130* 131* 131*  K 4.6  < > 4.0 4.0 3.6  CL 93*  < > 93* 94* 95*  CO2 23  < > 22 23 29   GLUCOSE 115*  < > 103* 127* 103*  BUN 20  < > 24* 23* 21*  CREATININE 3.81*  < > 4.34* 4.42* 4.12*  CALCIUM 8.0*  < > 8.6* 8.2* 8.1*  PHOS 5.3*  --   --  5.7* 4.7*  < > = values in this interval not displayed. Liver Function Tests:  Recent Labs Lab 11/17/14 0755  11/19/14 0423 11/20/14 0810 11/22/14 0830  AST 49*  --  30  --   --   ALT 87*  --  52  --   --   ALKPHOS 81  --  71  --   --   BILITOT 0.8  --  0.7  --   --   PROT 6.1*  --  6.0*  --   --   ALBUMIN 2.3*  < > 2.2* 2.0* 2.2*  < > = values in this interval not displayed. No results for input(s): LIPASE, AMYLASE in the last 168 hours. CBC:  Recent Labs Lab 11/17/14 2021 11/18/14 0406 11/19/14 0423 11/20/14 0540 11/22/14 0830  WBC 6.4 5.7 5.7 6.6 7.2  NEUTROABS  --   --   --  4.1  --   HGB 9.4* 8.9* 9.0* 9.0* 8.6*  HCT 30.3* 28.9* 28.6* 28.5* 27.6*  MCV 102.0* 101.0* 103.6* 102.5* 101.5*  PLT 162 161 129* 140* 203   Blood Culture    Component Value Date/Time   SDES URINE, RANDOM 11/15/2014 0116   SPECREQUEST NONE 11/15/2014 0116   CULT NO GROWTH 1 DAY 11/15/2014 0116   REPTSTATUS 11/16/2014 FINAL 11/15/2014 0116    Cardiac Enzymes: No results for input(s): CKTOTAL, CKMB, CKMBINDEX, TROPONINI in the last 168 hours. CBG:  Recent Labs Lab 11/21/14 0739 11/21/14 1145 11/21/14 1705 11/21/14 2319 11/22/14 0741  GLUCAP 90 95 112* 110* 107*   Iron Studies: No results for input(s): IRON, TIBC, TRANSFERRIN, FERRITIN in the last 72 hours. @lablastinr3 @ Studies/Results: No results found. Medications:   . amiodarone  200 mg Oral Daily  . amoxicillin-clavulanate  1 tablet Oral Q24H  . atorvastatin  40 mg Oral Daily  . bisacodyl  5-10 mg Oral QHS  . collagenase   Topical Daily  . darbepoetin (ARANESP) injection - DIALYSIS  60 mcg Intravenous Q Mon-HD  . DULoxetine  60 mg Oral BID  .  insulin aspart  0-9 Units Subcutaneous TID WC  . insulin glargine  10 Units Subcutaneous QHS  . levothyroxine  75 mcg Oral QAC breakfast  . lidocaine-EPINEPHrine  30 mL Intradermal Once  . midodrine      . midodrine  10 mg Oral TID WC  . multivitamin  1 tablet Oral QHS  . sevelamer carbonate  800 mg Oral TID WC  . sildenafil  80 mg Oral TID  . sodium chloride  3 mL Intravenous Q12H  . traZODone  50 mg Oral QHS

## 2014-11-22 NOTE — Progress Notes (Signed)
PT Cancellation Note  Patient Details Name: Larry [name] MRN: [id number] DOB: [birth year]   Cancelled Treatment:    Reason Eval/Treat Not Completed: Patient at procedure or test/unavailable (in HD and unavailable)   [name] 11/22/2014, 10:57 AM [name], [place]

## 2014-11-22 NOTE — Procedures (Signed)
Patient was seen on dialysis and the procedure was supervised.  BFR 400  Via PC BP is  93/56.   Patient appears to be tolerating treatment well  Larry [name] A 11/22/2014

## 2014-11-23 LAB — GLUCOSE, CAPILLARY: GLUCOSE-CAPILLARY: 74 mg/dL (ref 65–99)

## 2014-11-28 ENCOUNTER — Ambulatory Visit (HOSPITAL_COMMUNITY)
Admission: RE | Admit: 2014-11-28 | Discharge: 2014-11-28 | Disposition: A | Discharge status: Home / Self Care | Payer: Commercial Managed Care - HMO | Source: Ambulatory Visit | Attending: Vascular Surgery | Admitting: Vascular Surgery

## 2014-11-28 DIAGNOSIS — N186 End stage renal disease: Secondary | ICD-10-CM | POA: Diagnosis not present

## 2014-11-28 DIAGNOSIS — Z992 Dependence on renal dialysis: Secondary | ICD-10-CM | POA: Diagnosis not present

## 2014-11-28 DIAGNOSIS — E1165 Type 2 diabetes mellitus with hyperglycemia: Secondary | ICD-10-CM | POA: Insufficient documentation

## 2014-11-28 DIAGNOSIS — E7800 Pure hypercholesterolemia, unspecified: Secondary | ICD-10-CM | POA: Insufficient documentation

## 2014-11-28 DIAGNOSIS — Z4931 Encounter for adequacy testing for hemodialysis: Secondary | ICD-10-CM | POA: Diagnosis not present

## 2014-11-28 DIAGNOSIS — I120 Hypertensive chronic kidney disease with stage 5 chronic kidney disease or end stage renal disease: Secondary | ICD-10-CM | POA: Insufficient documentation

## 2014-11-28 DIAGNOSIS — E113299 Type 2 diabetes mellitus with mild nonproliferative diabetic retinopathy without macular edema, unspecified eye: Secondary | ICD-10-CM | POA: Insufficient documentation

## 2014-12-03 ENCOUNTER — Encounter: Payer: Self-pay | Admitting: Vascular Surgery

## 2014-12-04 ENCOUNTER — Other Ambulatory Visit (HOSPITAL_COMMUNITY): Payer: Self-pay | Admitting: Cardiology

## 2014-12-05 ENCOUNTER — Encounter: Payer: Self-pay | Admitting: Vascular Surgery

## 2014-12-05 ENCOUNTER — Ambulatory Visit (INDEPENDENT_AMBULATORY_CARE_PROVIDER_SITE_OTHER): Payer: Commercial Managed Care - HMO | Admitting: Vascular Surgery

## 2014-12-05 ENCOUNTER — Other Ambulatory Visit: Payer: Self-pay

## 2014-12-05 VITALS — BP 99/65 | HR 70 | Ht 70.0 in | Wt 208.0 lb

## 2014-12-05 DIAGNOSIS — N186 End stage renal disease: Secondary | ICD-10-CM

## 2014-12-05 DIAGNOSIS — Z992 Dependence on renal dialysis: Secondary | ICD-10-CM

## 2014-12-05 NOTE — Progress Notes (Signed)
POST OPERATIVE OFFICE NOTE    CC:  F/u for surgery  HPI:  This is a 64 y.o. male who is s/p who underwent right radial cephalic AVF on 08/10/48 by Dr. Oneida Alar.  He was recently in the hospital after falling and breaking his femur and fracturing ribs and requiring chest tubes for hemothorax and PTX.  He presents today for poorly maturing AVF.  He currently dialyzes M/W/F/S.  He still resides at the rehab center, but hopes to get home in the near future.    Allergies  Allergen Reactions  . Bydureon [Exenatide] Other (See Comments)    sweating  . Losartan Potassium Other (See Comments)    insomnia  . Gabapentin Other (See Comments)    confusion    Current Outpatient Prescriptions  Medication Sig Dispense Refill  . acetaminophen (TYLENOL) 325 MG tablet Take 650 mg by mouth every 4 (four) hours as needed for moderate pain or fever.    . ALPRAZolam (XANAX) 0.5 MG tablet Take 0.5 mg by mouth every 8 (eight) hours as needed for anxiety.    [name] amiodarone (PACERONE) 400 MG tablet Take 0.5 tablets (200 mg total) by mouth daily. 60 tablet 6  . atorvastatin (LIPITOR) 40 MG tablet TAKE 1 TABLET DAILY 90 tablet 3  . bisacodyl (DULCOLAX) 10 MG suppository Place 1 suppository (10 mg total) rectally daily as needed for moderate constipation. 12 suppository 0  . chlorhexidine (PERIDEX) 0.12 % solution Perform mouth rinses twice daily after breakfast and at bedtime. (Patient taking differently: Use as directed in the mouth or throat 2 (two) times daily. Perform mouth rinses twice daily after breakfast and at bedtime (swish and spit)) 120 mL 0  . DULoxetine (CYMBALTA) 30 MG capsule Take 60 mg by mouth 2 (two) times daily.     . insulin glargine (LANTUS) 100 unit/mL SOPN Inject 0.1 mLs (10 Units total) into the skin at bedtime. 15 mL 11  . insulin lispro (HUMALOG KWIKPEN) 100 UNIT/ML KiwkPen Inject 5-25 Units into the skin 3 (three) times daily as needed.     [name] levothyroxine (SYNTHROID, LEVOTHROID) 75 MCG  tablet TAKE 1 TABLET ON AN EMPTY STOMACH 30 MINUTES BEFORE BREAKFAST  5  . midodrine (PROAMATINE) 10 MG tablet Take 1 tablet (10 mg total) by mouth 3 (three) times daily. 90 tablet 6  . multivitamin (RENA-VIT) TABS tablet Take 1 tablet by mouth at bedtime. 30 tablet 6  . ondansetron (ZOFRAN) 4 MG tablet Take 1 tablet (4 mg total) by mouth every 8 (eight) hours as needed for nausea or vomiting. 30 tablet 0  . oxyCODONE-acetaminophen (PERCOCET) 10-325 MG per tablet Take 1 tablet by mouth every 4 (four) hours as needed for pain. (Patient taking differently: Take 1-2 tablets by mouth every 4 (four) hours as needed for pain. ) 120 tablet 0  . polyethylene glycol (MIRALAX / GLYCOLAX) packet Take 17 g by mouth daily as needed for moderate constipation. 14 each 0  . sevelamer carbonate (RENVELA) 800 MG tablet Take 800 mg by mouth 3 (three) times daily with meals.    . sildenafil (REVATIO) 20 MG tablet Take 4 tablets (80 mg total) by mouth 3 (three) times daily. 360 tablet 1  . traZODone (DESYREL) 50 MG tablet Take 50 mg by mouth at bedtime.    Alveda Reasons 15 MG TABS tablet TAKE 1 TABLET EVERY DAY WITH SUPPER 30 tablet 3   No current facility-administered medications for this visit.     ROS:  See HPI  Physical Exam:  Filed Vitals:   12/05/14 1602  BP: 99/65  Pulse: 70    Incision:  Well healed with spitting stitch. Extremities:  2+ right radial pulse; + thrill/bruit within fistula; strong handgrip; sensation and motor are in tact.  Right IJ diatek catheter in place  Assessment/Plan:  This is a 64 y.o. male who is s/p: Right RC AVF on 10/01/14 that is poorly maturing.  -diameters on duplex 11/28/14 reveal 0.35cm-0.59cm.  Given the fistula needs to be at least 0.6cm, we will plan a fistulogram on 12/13/14. -verified with pt & family member that he is not on Xarelto or any blood thinner at this time.  -spitting stitch removed today  Leontine Locket, PA-C [facility] [phone]  Clinic MD:  Pt seen and examined with Dr. Oneida Alar  History and exam findings as above. Patient has poorly maturing right radiocephalic AV fistula. His duplex ultrasound suggested maybe anastomotic narrowing but otherwise the vein is just small. We will schedule him for a fistulogram to see if it is salvageable. Otherwise we will need to consider a new access.  Ruta Hinds, MD [facility] [place] Office: [phone] Pager: [phone]

## 2014-12-09 ENCOUNTER — Ambulatory Visit (HOSPITAL_COMMUNITY)
Admission: RE | Admit: 2014-12-09 | Discharge: 2014-12-09 | Disposition: A | Discharge status: Home / Self Care | Payer: Commercial Managed Care - HMO | Source: Ambulatory Visit | Attending: Cardiology | Admitting: Cardiology

## 2014-12-09 VITALS — BP 102/50 | HR 81 | Wt 207.2 lb

## 2014-12-09 DIAGNOSIS — I482 Chronic atrial fibrillation, unspecified: Secondary | ICD-10-CM

## 2014-12-09 DIAGNOSIS — I2510 Atherosclerotic heart disease of native coronary artery without angina pectoris: Secondary | ICD-10-CM | POA: Diagnosis not present

## 2014-12-09 DIAGNOSIS — Z992 Dependence on renal dialysis: Secondary | ICD-10-CM

## 2014-12-09 DIAGNOSIS — N186 End stage renal disease: Secondary | ICD-10-CM | POA: Diagnosis not present

## 2014-12-09 DIAGNOSIS — S272XXD Traumatic hemopneumothorax, subsequent encounter: Secondary | ICD-10-CM

## 2014-12-09 DIAGNOSIS — I272 Other secondary pulmonary hypertension: Secondary | ICD-10-CM | POA: Insufficient documentation

## 2014-12-09 DIAGNOSIS — G4733 Obstructive sleep apnea (adult) (pediatric): Secondary | ICD-10-CM | POA: Diagnosis not present

## 2014-12-09 DIAGNOSIS — I480 Paroxysmal atrial fibrillation: Secondary | ICD-10-CM | POA: Insufficient documentation

## 2014-12-09 DIAGNOSIS — I5022 Chronic systolic (congestive) heart failure: Secondary | ICD-10-CM | POA: Diagnosis not present

## 2014-12-09 LAB — COMPREHENSIVE METABOLIC PANEL
ALK PHOS: 69 U/L (ref 38–126)
ALT: 21 U/L (ref 17–63)
ANION GAP: 12 (ref 5–15)
AST: 29 U/L (ref 15–41)
Albumin: 2.7 g/dL — ABNORMAL LOW (ref 3.5–5.0)
BILIRUBIN TOTAL: 0.5 mg/dL (ref 0.3–1.2)
BUN: 34 mg/dL — ABNORMAL HIGH (ref 6–20)
CALCIUM: 9.2 mg/dL (ref 8.9–10.3)
CO2: 25 mmol/L (ref 22–32)
Chloride: 96 mmol/L — ABNORMAL LOW (ref 101–111)
Creatinine, Ser: 2.81 mg/dL — ABNORMAL HIGH (ref 0.61–1.24)
GFR, EST AFRICAN AMERICAN: 26 mL/min — AB (ref >60)
GFR, EST NON AFRICAN AMERICAN: 23 mL/min — AB (ref >60)
Glucose, Bld: 163 mg/dL — ABNORMAL HIGH (ref 65–99)
Potassium: 4.2 mmol/L (ref 3.5–5.1)
Sodium: 133 mmol/L — ABNORMAL LOW (ref 135–145)
TOTAL PROTEIN: 7.1 g/dL (ref 6.5–8.1)

## 2014-12-09 NOTE — Patient Instructions (Signed)
We will contact you in 3 months to schedule your next appointment.  

## 2014-12-09 NOTE — Progress Notes (Signed)
Patient ID: Larry [name], male   DOB: [birth year], 64 y.o.   MRN: [id number] PCP: Dr. Inda Merlin Endocrinologist: Dr Buddy Duty  63 yo with history of chronic systolic CHF (nonischemic cardiomyopathy), CKD, prior CVA, and paroxysmal atrial fibrillation.  Patient says that he has been known to have a low EF for years.  He has a [employer], [employer] ICD.  He had LHC in 2009 with nonobstructive disease. Last echo in 12/15 showed EF 20-25% with normal RV size and systolic function.     Patient had [place] 8/15 showing low cardiac output, mildly elevated PCWP and primarily pulmonary venous hypertension.  He was transitioned from Lasix to torsemide and started on digoxin.  Entresto was started and valsartan stopped.   Adenosine Cardiolite was done in 12/15 showing large area of apical scar but no ischemia.  This raised concern for possible ischemic cardiomyopathy.  He was admitted later in 12/15 with acute on chronic systolic CHF.  He also was noted to have gone into atrial fibrillation during this hospitalization, amiodarone was started in preparation for possible DCCV.  Troponin was mildly elevated in the hospital in the absence of chest pain, likely demand ischemia.  He was diuresed with IV Lasix and was discharged soon after.  He was cardioverted back to NSR on 02/14/14.  He was cardioverted again to NSR in 3/16.  He had RHC done 05/08/14 with mixed pulmonary hypertension and PWCP 25, CI 1.75.  CPX done 4/16 showed severe functional limitation.  In 4/16, he was started on home milrinone via PICC at 0.25 mcg/kg/min.  Co-ox increased from 55% => 72%.  Entresto stopped with elevated creatinine.   Mr Menna initially remained breathless with NYHA class III symptoms even after starting milrinone.  He came back for LHC/RHC in 5/16.  This showed nonobstructive CAD and elevated filling pressures including severe pulmonary hypertension.  CI was 1.95 by Fick.  I increased milrinone to 0.375, started him on Revatio, and diuresed him.   He had RHC  again in 7/16, showing elevated right and left heart filling pressures and severe pulmonary HTN.  Milrinone was increased to 0.5 and Revatio was increased.  He was discharged home but had a fall with left femoral fracture.  The fracture was repaired but he developed AKI post-op initially requiring CVVH then HD. He was able to come off inotropes after starting HD. He developed a large retroperitoneal hematoma in 8/16 and warfarin was stopped.  After a long hospitalization, he was discharged to [facility].    He had several falls and developed encephalopathy in 10/16.  He was admitted and found to have rib fracture with right hemothorax.  He got a chest tube.  The CT showed that his prior retroperitoneal hematoma had not resolved.  Warfarin was again stopped.  The cause of encephalopathy was thought to be gabapentin.   He is back at [facility] nursing home.  He has been getting HD 4 times a week due to volume overload.  He is starting to tolerate HD better with less nausea.  He can walk about 120 feet with PT using a walker then fatigues.  Mild dyspnea with exertion.  No chest pain.  He remains in NSR.   ECG: NSR, 1st degree AV block, RVH  Labs (2/15): LDL 103 Labs (3/15): K 4.1, creatinine 1.5, BNP 164 Labs (4/15): K 3.5, creatinine 3.3 => 1.9 => 2.1, BNP 96 Labs (4/15): K 4.3, creatinine 1.43, glucose 308 Labs (5/15): K 5.1 Creatinine 1.35  Labs (  8/15): K 5 => 4.7 => 3.8, creatinine 1.19 => 1.57 => 1.6, digoxin 1.1 Labs (9/15): K 4.6, creatinine 1.32 => 1.9 => 1.1, digoxin 0.6 => 1.2 (not trough) Labs (10/15): K 4.5, creatinine 1.36, pro-BNP 1261 Labs (12/15): TSH 8.6 (elevated), free T3/free T4 normal, K 4.5, creatinine 1.71, digoxin 0.6, HCT 44.1, AST 38, ALT 44, TSH normal Labs (1/16): K 5.1, creatinine 1.96, LFTs normal, BNP 952 Labs (3/16): K 4.6, creatinine 2.06, LFTs normal, digoxin 1.8 (decreased digoxin to 0.0625) Labs (4/16): K 3.5, creatinine 1.83, LDL 37, HDL 28, HIV negative,  lupus anticoagulant +.  Labs (5/16): K 3.3, creatinine 1.8, digoxin 0.4, HCT 34 Labs (6/16): K 4.7 => 5, creatinine 2.11 => 2.4, hgb 11.4  Labs (10/16): HCT 27.6, AST 49, ALT 87  PMH: 1. Chronic systolic CHF: ? Nonischemic cardiomyopathy.  Most recent echo in 3/15 with EF 25-30%, moderate LV dilation, mild LVH, moderate diastolic dysfunction, IVC normal, mildly decreased RV systolic function.  Prior echo in 2011 also with EF 25-30%.  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  St Jude ICD. CPX 05/2013: FVC 3.89 (82%), FEV1 2.99 (83%), FEV1/FVC 77%, Peak HR 118 (74% predicted max), Peak VO2 12.4 (51% of predicted; when corrected to IBW =17.7), VE/VCO2 29.9, OUES 1.39, Peak RER 1.21.  RHC (8/15) with mean RA 6, PA 60/21 mean 36, mean PCWP 21, CI 1.81, PVR 3.6 WU.  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  Echo (12/15) with EF 20-25%, inferior akinesis, normal RV size and systolic function.  RHC (3/16) with mean RA 7, PA 76/24 mean 43, mean PCWP 25, CI 1.75, PVR 4.62. CPX (4/16) with RER 0.99, peak VO2 7.1, slope 36 => severely decreased functional capacity though submaximal. Home milrinone at 0.25 mcg/kg/min begun 4/16, increased to 0.375 in 5/16.  LHC/RHC (5/16) with nonobstructive CAD, mean RA 17, PA 102/37 mean 60, mean PCWP 37, CI 1.95 Fick/2.59 thermo, PVR 5 WU.  RHC (7/16) with mean RA 14, PA 91/34 mean 51, mean PCWP 27, CI 2.18 Fick/1.92 thermo, PVR 5.7 WU.   2. Type II diabetes 3. ESRD on HD 4. Monoclonal gammopathy of uncertain etiology.  5. CVA 2007 6. Degenerative disc disease: Chronic low back pain.   7. Pituitary microadenoma: Nonfunctioning, s/p surgery in 2009.  8. OSA: Complex, awaiting Bipap titration. 9. Depression 10. Hypothyroidism 11. Hyperlipidemia 12. Atrial fibrillation:   Patient is on Xarelto. DCCV 02/14/14 to NSR.  13. 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 Lupus anticoagulant positive:  No h/o DVT.  Saw Dr Lindi Adie with hematology.  15. Spontaneous RP hematoma 8/16 16. Left femoral fracture 7/16 s/p repair.  17. Rib fracture with hemothorax in 10/16.   SH: Married, former cigar smoker, unemployed.   FH: Father with MI x 2 and CHF, strong history of CAD on his father's side.   ROS: All systems reviewed and negative except as per HPI.   Current Outpatient Prescriptions  Medication Sig Dispense Refill  . acetaminophen (TYLENOL) 325 MG tablet Take 650 mg by mouth every 4 (four) hours as needed for moderate pain or fever.    . ALPRAZolam (XANAX) 0.5 MG tablet Take 0.5 mg by mouth every 8 (eight) hours as needed for anxiety.    [name] amiodarone (PACERONE) 400 MG tablet Take 0.5 tablets (200 mg total) by mouth daily. 60 tablet 6  . atorvastatin (LIPITOR) 40 MG tablet TAKE 1 TABLET DAILY 90 tablet 3  . bisacodyl (  DULCOLAX) 10 MG suppository Place 1 suppository (10 mg total) rectally daily as needed for moderate constipation. 12 suppository 0  . calcium acetate (PHOSLO) 667 MG capsule Take 667 mg by mouth 3 (three) times daily with meals.    . chlorhexidine (PERIDEX) 0.12 % solution Perform mouth rinses twice daily after breakfast and at bedtime. (Patient taking differently: Use as directed in the mouth or throat 2 (two) times daily. Perform mouth rinses twice daily after breakfast and at bedtime (swish and spit)) 120 mL 0  . DULoxetine (CYMBALTA) 30 MG capsule Take 60 mg by mouth 2 (two) times daily.     . insulin glargine (LANTUS) 100 unit/mL SOPN Inject 0.1 mLs (10 Units total) into the skin at bedtime. 15 mL 11  . insulin lispro (HUMALOG KWIKPEN) 100 UNIT/ML KiwkPen Inject 5-25 Units into the skin 3 (three) times daily as needed.     [name] levothyroxine (SYNTHROID, LEVOTHROID) 75 MCG tablet TAKE 1 TABLET ON AN EMPTY STOMACH 30 MINUTES BEFORE BREAKFAST  5  . midodrine (PROAMATINE) 10 MG tablet Take 1 tablet (10 mg total) by mouth 3 (three) times daily. 90 tablet 6  . multivitamin  (RENA-VIT) TABS tablet Take 1 tablet by mouth at bedtime. 30 tablet 6  . ondansetron (ZOFRAN) 4 MG tablet Take 1 tablet (4 mg total) by mouth every 8 (eight) hours as needed for nausea or vomiting. 30 tablet 0  . oxyCODONE-acetaminophen (PERCOCET) 10-325 MG per tablet Take 1 tablet by mouth every 4 (four) hours as needed for pain. (Patient taking differently: Take 1-2 tablets by mouth every 4 (four) hours as needed for pain. ) 120 tablet 0  . Pollen Extracts (PROSTAT PO) Take 30 mLs by mouth daily.    . polyethylene glycol (MIRALAX / GLYCOLAX) packet Take 17 g by mouth daily as needed for moderate constipation. 14 each 0  . sildenafil (REVATIO) 20 MG tablet Take 4 tablets (80 mg total) by mouth 3 (three) times daily. 360 tablet 1  . traZODone (DESYREL) 50 MG tablet Take 50 mg by mouth at bedtime.     No current facility-administered medications for this encounter.    Filed Vitals:   12/09/14 0916  BP: 102/50  Pulse: 81  Weight: 207 lb 4 oz (94.008 kg)  SpO2: 97%   General: NAD Wife present  Neck: Thick, JVP 8 cm. No thyromegaly or thyroid nodule.  Lungs: Clear to auscultation bilaterally with normal respiratory effort. CV: Lateral PMI.  Heart regular S1/S2, no S3/S4, no murmur.  2+ edema to knees bilaterally.  No carotid bruit.  Normal pedal pulses.  Abdomen: Obese, Soft, tender to palpation, obese, no hepatosplenomegaly, non-distended Skin: Intact without lesions or rashes.  Neurologic: Alert and oriented x 3.  Psych: Flat affect. Extremities: No clubbing or cyanosis.   Assessment/plan: 1. Chronic systolic CHF:  Nonischemic cardiomyopathy x years.  EF 20-25% with normal RV size and systolic function on 94/70 echo. Has St Jude ICD and has wide QRS (166 msec today) but not true LBBB pattern so suspect benefit from CRT upgrade would not be marked.  CPX in 4/16 showed severe functional impairment but was submaximal.  He was started on milrinone gtt in 4/16 but remained symptomatic.  He  returned in 5/16 for RHC/LHC.  This showed nonobstructive CAD, severely elevated PCWP, and severe pulmonary hypertension (again, mixed PAH/PVH).  Milrinone was increased, Revatio was started.  Repeat RHC in 7/16 was similar to 5/16.  Unfortunately, he fractured his femur then developed ESRD.  He was able to come off inotropes but LVAD is no longer an option for him.   - He continues to get HD qid for volume overload.  Mild volume overload on exam today. - Still on midodrine.  2. ESRD: Tolerating HD better.  Still going to HD 4 days/week.    3. Atrial fibrillation: Paroxysmal.  He is in NSR today.   - Can continue amiodarone 200 mg daily.  LFTs mildly elevated when he was recently hospitalized => recheck LFTs today.  He is on Levoxyl for hypothyroidism followed by PCP.  He will need a regular eye exam.  - Given recent hemothorax and persistence of RP hematoma by CT, he is now off coumadin again.  He may be a Watchman candidate in the future, but would still have to be on coumadin for a short period after the Watchman and then on ASA/Plavix for a period.    4. OSA: Complex.  Per pulmonary.  5. 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  Continue statin.  6. 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  PVR 5.7 on RHC in 7/16.  He is now on Revatio.     Followup in 2 months.   Loralie Champagne  12/09/2014

## 2014-12-10 ENCOUNTER — Ambulatory Visit (INDEPENDENT_AMBULATORY_CARE_PROVIDER_SITE_OTHER): Payer: Commercial Managed Care - HMO | Admitting: *Deleted

## 2014-12-10 DIAGNOSIS — I5022 Chronic systolic (congestive) heart failure: Secondary | ICD-10-CM | POA: Diagnosis not present

## 2014-12-10 DIAGNOSIS — I472 Ventricular tachycardia, unspecified: Secondary | ICD-10-CM

## 2014-12-10 DIAGNOSIS — R55 Syncope and collapse: Secondary | ICD-10-CM

## 2014-12-10 HISTORY — DX: Syncope and collapse: R55

## 2014-12-11 NOTE — Progress Notes (Signed)
Remote ICD transmission.   

## 2014-12-13 ENCOUNTER — Other Ambulatory Visit: Payer: Self-pay

## 2014-12-13 ENCOUNTER — Encounter (HOSPITAL_COMMUNITY): Payer: Self-pay | Admitting: Vascular Surgery

## 2014-12-13 ENCOUNTER — Ambulatory Visit (HOSPITAL_COMMUNITY)
Admission: RE | Admit: 2014-12-13 | Discharge: 2014-12-13 | Disposition: A | Discharge status: Home / Self Care | Payer: Commercial Managed Care - HMO | Source: Ambulatory Visit | Attending: Vascular Surgery | Admitting: Vascular Surgery

## 2014-12-13 ENCOUNTER — Encounter (HOSPITAL_COMMUNITY)
Admission: RE | Disposition: A | Discharge status: Home / Self Care | Payer: Self-pay | Source: Ambulatory Visit | Attending: Vascular Surgery

## 2014-12-13 DIAGNOSIS — Z7901 Long term (current) use of anticoagulants: Secondary | ICD-10-CM | POA: Diagnosis not present

## 2014-12-13 DIAGNOSIS — N186 End stage renal disease: Secondary | ICD-10-CM | POA: Diagnosis not present

## 2014-12-13 DIAGNOSIS — Y832 Surgical operation with anastomosis, bypass or graft as the cause of abnormal reaction of the patient, or of later complication, without mention of misadventure at the time of the procedure: Secondary | ICD-10-CM | POA: Insufficient documentation

## 2014-12-13 DIAGNOSIS — T82898A Other specified complication of vascular prosthetic devices, implants and grafts, initial encounter: Secondary | ICD-10-CM | POA: Insufficient documentation

## 2014-12-13 HISTORY — PX: PERIPHERAL VASCULAR CATHETERIZATION: SHX172C

## 2014-12-13 LAB — POCT I-STAT, CHEM 8
BUN: 55 mg/dL — AB (ref 6–20)
CREATININE: 3.2 mg/dL — AB (ref 0.61–1.24)
Calcium, Ion: 0.96 mmol/L — ABNORMAL LOW (ref 1.13–1.30)
Chloride: 97 mmol/L — ABNORMAL LOW (ref 101–111)
GLUCOSE: 149 mg/dL — AB (ref 65–99)
HCT: 34 % — ABNORMAL LOW (ref 39.0–52.0)
Hemoglobin: 11.6 g/dL — ABNORMAL LOW (ref 13.0–17.0)
Potassium: 4.6 mmol/L (ref 3.5–5.1)
Sodium: 132 mmol/L — ABNORMAL LOW (ref 135–145)
TCO2: 24 mmol/L (ref 0–100)

## 2014-12-13 SURGERY — A/V SHUNTOGRAM/FISTULAGRAM
Anesthesia: LOCAL | Laterality: Right

## 2014-12-13 MED ORDER — LIDOCAINE HCL (PF) 1 % IJ SOLN
INTRAMUSCULAR | Status: AC
  Filled 2014-12-13: qty 30

## 2014-12-13 MED ORDER — LIDOCAINE HCL (PF) 1 % IJ SOLN
INTRAMUSCULAR | Status: DC | PRN
  Administered 2014-12-13: 8 mL

## 2014-12-13 MED ORDER — ONDANSETRON HCL 4 MG/2ML IJ SOLN: 4.0000 mg | Freq: Four times a day (QID) | INTRAMUSCULAR | Status: DC | PRN

## 2014-12-13 MED ORDER — METOPROLOL TARTRATE 1 MG/ML IV SOLN: 2.0000 mg | INTRAVENOUS | Status: DC | PRN

## 2014-12-13 MED ORDER — SODIUM CHLORIDE 0.9 % IJ SOLN: 3.0000 mL | INTRAMUSCULAR | Status: DC | PRN

## 2014-12-13 MED ORDER — HYDRALAZINE HCL 20 MG/ML IJ SOLN: 5.0000 mg | INTRAMUSCULAR | Status: DC | PRN

## 2014-12-13 MED ORDER — IODIXANOL 320 MG/ML IV SOLN
INTRAVENOUS | Status: DC | PRN
  Administered 2014-12-13: 60 mL via INTRAVENOUS

## 2014-12-13 MED ORDER — LABETALOL HCL 5 MG/ML IV SOLN: 10.0000 mg | INTRAVENOUS | Status: DC | PRN

## 2014-12-13 MED ORDER — HEPARIN (PORCINE) IN NACL 2-0.9 UNIT/ML-% IJ SOLN
INTRAMUSCULAR | Status: AC
  Filled 2014-12-13: qty 500

## 2014-12-13 MED ORDER — ACETAMINOPHEN 325 MG PO TABS: 325.0000 mg | ORAL_TABLET | ORAL | Status: DC | PRN

## 2014-12-13 MED ORDER — ACETAMINOPHEN 325 MG RE SUPP: 325.0000 mg | RECTAL | Status: DC | PRN

## 2014-12-13 SURGICAL SUPPLY — 11 items
BAG SNAP BAND KOVER 36X36 (MISCELLANEOUS) ×2 IMPLANT
COVER DOME SNAP 22 D (MISCELLANEOUS) ×2 IMPLANT
COVER PRB 48X5XTLSCP FOLD TPE (BAG) ×1 IMPLANT
COVER PROBE 5X48 (BAG) ×1
KIT MICROINTRODUCER STIFF 5F (SHEATH) ×2 IMPLANT
PROTECTION STATION PRESSURIZED (MISCELLANEOUS) ×2
STATION PROTECTION PRESSURIZED (MISCELLANEOUS) ×1 IMPLANT
STOPCOCK MORSE 400PSI 3WAY (MISCELLANEOUS) ×2 IMPLANT
TRAY PV CATH (CUSTOM PROCEDURE TRAY) ×2 IMPLANT
TUBING CIL FLEX 10 FLL-RA (TUBING) ×2 IMPLANT
WIRE BENTSON .035X145CM (WIRE) ×2 IMPLANT

## 2014-12-13 NOTE — Op Note (Signed)
Procedure: Right upper extremity fistulogram  Preoperative diagnosis: Poor access flow  Postoperative diagnosis: Same  Anesthesia: Local  Operative findings: Diffuse narrowing proximal 10% of fistula with small radial artery inflow  Operative details: After obtaining informed consent, the patient was taken to the [place] lab. The patient was placed in supine position on the Angio table. The patient's right upper extremity was prepped and draped in the usual sterile fashion. Local anesthesia was infiltrated over the right radiocephalic AV fistula at the distal forearm. Ultrasound was used to identify the fistula. Using a micropuncture needle was able to cannulate the fistula in the micropuncture needle wire advanced without difficulty. Micropuncture sheath was then placed over the guidewire into the proximal aspect of the right radiocephalic AV fistula. Contrast angiogram was then obtained. The central veins are patent. There is sluggish flow through the fistula. The main portion of the fistula in the forearm is without narrowing. Next a blood pressure cuff was inflated on the upper arm to allow contrast to reflux across the anastomosis. The proximal 10% of the fistula is diffusely narrowed the radial artery inflow is very small. At this point the sheath was removed and the needle puncture site repaired with a single Monocryl stitch. The patient tolerated the procedure well and there were no complications. Patient holding area in stable condition.  Operative management: Patient has a very small radial artery proximal cephalic vein is diffusely narrowed and I do not believe revision will salvage this fistula. The patient will be scheduled for a right brachiocephalic fistula in the near future.  Ruta Hinds, MD [facility] [place] Office: [phone] Pager: [phone]

## 2014-12-13 NOTE — Interval H&P Note (Signed)
History and Physical Interval Note:  12/13/2014 7:49 AM  Larry [name]  has presented today for surgery, with the diagnosis of End Stage Renal, Poorly maturing right arm av fistula  The various methods of treatment have been discussed with the patient and family. After consideration of risks, benefits and other options for treatment, the patient has consented to  Procedure(s): Fistulagram (Right) as a surgical intervention .  The patient's history has been reviewed, patient examined, no change in status, stable for surgery.  I have reviewed the patient's chart and labs.  Questions were answered to the patient's satisfaction.     Ruta Hinds

## 2014-12-13 NOTE — Discharge Instructions (Signed)
Fistulogram, Care After °Refer to this sheet in the next few weeks. These instructions provide you with information on caring for yourself after your procedure. Your health care provider may also give you more specific instructions. Your treatment has been planned according to current medical practices, but problems sometimes occur. Call your health care provider if you have any problems or questions after your procedure. °WHAT TO EXPECT AFTER THE PROCEDURE °After your procedure, it is typical to have the following: °· A small amount of discomfort in the area where the catheters were placed. °· A small amount of bruising around the fistula. °· Sleepiness and fatigue. °HOME CARE INSTRUCTIONS °· Rest at home for the day following your procedure. °· Do not drive or operate heavy machinery while taking pain medicine. °· Take medicines only as directed by your health care provider. °· Do not take baths, swim, or use a hot tub until your health care provider approves. You may shower 24 hours after the procedure or as directed by your health care provider. °· There are many different ways to close and cover an incision, including stitches, skin glue, and adhesive strips. Follow your health care provider's instructions on: °¨ Incision care. °¨ Bandage (dressing) changes and removal. °¨ Incision closure removal. °· Monitor your dialysis fistula carefully. °SEEK MEDICAL CARE IF: °· You have drainage, redness, swelling, or pain at your catheter site. °· You have a fever. °· You have chills. °SEEK IMMEDIATE MEDICAL CARE IF: °· You feel weak. °· You have trouble balancing. °· You have trouble moving your arms or legs. °· You have problems with your speech or vision. °· You can no longer feel a vibration or buzz when you put your fingers over your dialysis fistula. °· The limb that was used for the procedure: °¨ Swells. °¨ Is painful. °¨ Is cold. °¨ Is discolored, such as blue or pale white. °  °This information is not intended  to replace advice given to you by your health care provider. Make sure you discuss any questions you have with your health care provider. °  °Document Released: 06/11/2013 Document Reviewed: 06/11/2013 °Elsevier Interactive Patient Education ©2016 [employer] Inc[employer] ° °

## 2014-12-13 NOTE — H&P (View-Only) (Signed)
POST OPERATIVE OFFICE NOTE    CC:  F/u for surgery  HPI:  This is a 64 y.o. male who is s/p who underwent right radial cephalic AVF on 9/93/57 by Dr. Oneida Alar.  He was recently in the hospital after falling and breaking his femur and fracturing ribs and requiring chest tubes for hemothorax and PTX.  He presents today for poorly maturing AVF.  He currently dialyzes M/W/F/S.  He still resides at the rehab center, but hopes to get home in the near future.    Allergies  Allergen Reactions  . Bydureon [Exenatide] Other (See Comments)    sweating  . Losartan Potassium Other (See Comments)    insomnia  . Gabapentin Other (See Comments)    confusion    Current Outpatient Prescriptions  Medication Sig Dispense Refill  . acetaminophen (TYLENOL) 325 MG tablet Take 650 mg by mouth every 4 (four) hours as needed for moderate pain or fever.    . ALPRAZolam (XANAX) 0.5 MG tablet Take 0.5 mg by mouth every 8 (eight) hours as needed for anxiety.    [name] amiodarone (PACERONE) 400 MG tablet Take 0.5 tablets (200 mg total) by mouth daily. 60 tablet 6  . atorvastatin (LIPITOR) 40 MG tablet TAKE 1 TABLET DAILY 90 tablet 3  . bisacodyl (DULCOLAX) 10 MG suppository Place 1 suppository (10 mg total) rectally daily as needed for moderate constipation. 12 suppository 0  . chlorhexidine (PERIDEX) 0.12 % solution Perform mouth rinses twice daily after breakfast and at bedtime. (Patient taking differently: Use as directed in the mouth or throat 2 (two) times daily. Perform mouth rinses twice daily after breakfast and at bedtime (swish and spit)) 120 mL 0  . DULoxetine (CYMBALTA) 30 MG capsule Take 60 mg by mouth 2 (two) times daily.     . insulin glargine (LANTUS) 100 unit/mL SOPN Inject 0.1 mLs (10 Units total) into the skin at bedtime. 15 mL 11  . insulin lispro (HUMALOG KWIKPEN) 100 UNIT/ML KiwkPen Inject 5-25 Units into the skin 3 (three) times daily as needed.     [name] levothyroxine (SYNTHROID, LEVOTHROID) 75 MCG  tablet TAKE 1 TABLET ON AN EMPTY STOMACH 30 MINUTES BEFORE BREAKFAST  5  . midodrine (PROAMATINE) 10 MG tablet Take 1 tablet (10 mg total) by mouth 3 (three) times daily. 90 tablet 6  . multivitamin (RENA-VIT) TABS tablet Take 1 tablet by mouth at bedtime. 30 tablet 6  . ondansetron (ZOFRAN) 4 MG tablet Take 1 tablet (4 mg total) by mouth every 8 (eight) hours as needed for nausea or vomiting. 30 tablet 0  . oxyCODONE-acetaminophen (PERCOCET) 10-325 MG per tablet Take 1 tablet by mouth every 4 (four) hours as needed for pain. (Patient taking differently: Take 1-2 tablets by mouth every 4 (four) hours as needed for pain. ) 120 tablet 0  . polyethylene glycol (MIRALAX / GLYCOLAX) packet Take 17 g by mouth daily as needed for moderate constipation. 14 each 0  . sevelamer carbonate (RENVELA) 800 MG tablet Take 800 mg by mouth 3 (three) times daily with meals.    . sildenafil (REVATIO) 20 MG tablet Take 4 tablets (80 mg total) by mouth 3 (three) times daily. 360 tablet 1  . traZODone (DESYREL) 50 MG tablet Take 50 mg by mouth at bedtime.    Alveda Reasons 15 MG TABS tablet TAKE 1 TABLET EVERY DAY WITH SUPPER 30 tablet 3   No current facility-administered medications for this visit.     ROS:  See HPI  Physical Exam:  Filed Vitals:   12/05/14 1602  BP: 99/65  Pulse: 70    Incision:  Well healed with spitting stitch. Extremities:  2+ right radial pulse; + thrill/bruit within fistula; strong handgrip; sensation and motor are in tact.  Right IJ diatek catheter in place  Assessment/Plan:  This is a 64 y.o. male who is s/p: Right RC AVF on 10/01/14 that is poorly maturing.  -diameters on duplex 11/28/14 reveal 0.35cm-0.59cm.  Given the fistula needs to be at least 0.6cm, we will plan a fistulogram on 12/13/14. -verified with pt & family member that he is not on Xarelto or any blood thinner at this time.  -spitting stitch removed today  Leontine Locket, PA-C [facility] [phone]  Clinic MD:  Pt seen and examined with Dr. Oneida Alar  History and exam findings as above. Patient has poorly maturing right radiocephalic AV fistula. His duplex ultrasound suggested maybe anastomotic narrowing but otherwise the vein is just small. We will schedule him for a fistulogram to see if it is salvageable. Otherwise we will need to consider a new access.  Ruta Hinds, MD [facility] [place] Office: [phone] Pager: [phone]

## 2014-12-16 ENCOUNTER — Encounter (HOSPITAL_COMMUNITY): Payer: Self-pay | Admitting: *Deleted

## 2014-12-16 NOTE — Progress Notes (Signed)
Larry [name] notified that patient has a ICD and surgery is scheduled for 7:30 AM , 12/17/14.

## 2014-12-16 NOTE — Progress Notes (Signed)
I spoke with patient's nurse, Sharyn Lull at [facility], [place] and to patient .  Sharyn Lull reports that fasting CBG runs 120- 150.  Patient refuses hs Lantus.  Patient tested post ive for Sleep apnea,but did not tolerate CPAP due to claustrophobia.  Patient reports that his wife or brother- in- law will drive patient to and from [place].

## 2014-12-17 ENCOUNTER — Ambulatory Visit (HOSPITAL_COMMUNITY): Payer: Commercial Managed Care - HMO | Admitting: Certified Registered Nurse Anesthetist

## 2014-12-17 ENCOUNTER — Encounter (HOSPITAL_COMMUNITY)
Admission: RE | Disposition: A | Discharge status: Home / Self Care | Payer: Self-pay | Source: Ambulatory Visit | Attending: Vascular Surgery

## 2014-12-17 ENCOUNTER — Other Ambulatory Visit: Payer: Commercial Managed Care - HMO

## 2014-12-17 ENCOUNTER — Ambulatory Visit (HOSPITAL_COMMUNITY)
Admission: RE | Admit: 2014-12-17 | Discharge: 2014-12-17 | Disposition: A | Discharge status: Home / Self Care | Payer: Commercial Managed Care - HMO | Source: Ambulatory Visit | Attending: Vascular Surgery | Admitting: Vascular Surgery

## 2014-12-17 ENCOUNTER — Encounter (HOSPITAL_COMMUNITY): Payer: Self-pay | Admitting: Certified Registered Nurse Anesthetist

## 2014-12-17 DIAGNOSIS — N186 End stage renal disease: Secondary | ICD-10-CM | POA: Diagnosis not present

## 2014-12-17 DIAGNOSIS — Z48812 Encounter for surgical aftercare following surgery on the circulatory system: Secondary | ICD-10-CM

## 2014-12-17 DIAGNOSIS — I2510 Atherosclerotic heart disease of native coronary artery without angina pectoris: Secondary | ICD-10-CM | POA: Insufficient documentation

## 2014-12-17 DIAGNOSIS — Z7901 Long term (current) use of anticoagulants: Secondary | ICD-10-CM | POA: Insufficient documentation

## 2014-12-17 DIAGNOSIS — E039 Hypothyroidism, unspecified: Secondary | ICD-10-CM | POA: Diagnosis not present

## 2014-12-17 DIAGNOSIS — Z794 Long term (current) use of insulin: Secondary | ICD-10-CM | POA: Insufficient documentation

## 2014-12-17 DIAGNOSIS — Z992 Dependence on renal dialysis: Secondary | ICD-10-CM | POA: Insufficient documentation

## 2014-12-17 DIAGNOSIS — J449 Chronic obstructive pulmonary disease, unspecified: Secondary | ICD-10-CM | POA: Insufficient documentation

## 2014-12-17 DIAGNOSIS — E119 Type 2 diabetes mellitus without complications: Secondary | ICD-10-CM | POA: Diagnosis not present

## 2014-12-17 DIAGNOSIS — T82898S Other specified complication of vascular prosthetic devices, implants and grafts, sequela: Secondary | ICD-10-CM | POA: Diagnosis not present

## 2014-12-17 DIAGNOSIS — I252 Old myocardial infarction: Secondary | ICD-10-CM | POA: Diagnosis not present

## 2014-12-17 DIAGNOSIS — N185 Chronic kidney disease, stage 5: Secondary | ICD-10-CM | POA: Diagnosis not present

## 2014-12-17 DIAGNOSIS — Z87891 Personal history of nicotine dependence: Secondary | ICD-10-CM | POA: Diagnosis not present

## 2014-12-17 DIAGNOSIS — I120 Hypertensive chronic kidney disease with stage 5 chronic kidney disease or end stage renal disease: Secondary | ICD-10-CM | POA: Diagnosis present

## 2014-12-17 HISTORY — PX: LIGATION OF ARTERIOVENOUS  FISTULA: SHX5948

## 2014-12-17 HISTORY — PX: AV FISTULA PLACEMENT: SHX1204

## 2014-12-17 LAB — POCT I-STAT 4, (NA,K, GLUC, HGB,HCT)
Glucose, Bld: 149 mg/dL — ABNORMAL HIGH (ref 65–99)
HEMATOCRIT: 35 % — AB (ref 39.0–52.0)
HEMOGLOBIN: 11.9 g/dL — AB (ref 13.0–17.0)
Potassium: 3.9 mmol/L (ref 3.5–5.1)
SODIUM: 134 mmol/L — AB (ref 135–145)

## 2014-12-17 LAB — GLUCOSE, CAPILLARY: GLUCOSE-CAPILLARY: 140 mg/dL — AB (ref 65–99)

## 2014-12-17 SURGERY — ARTERIOVENOUS (AV) FISTULA CREATION
Anesthesia: Monitor Anesthesia Care | Site: Arm Upper | Laterality: Right

## 2014-12-17 MED ORDER — PROTAMINE SULFATE 10 MG/ML IV SOLN
INTRAVENOUS | Status: DC | PRN
  Administered 2014-12-17 (×5): 10 mg via INTRAVENOUS

## 2014-12-17 MED ORDER — LIDOCAINE HCL (CARDIAC) 20 MG/ML IV SOLN
INTRAVENOUS | Status: AC
  Filled 2014-12-17: qty 5

## 2014-12-17 MED ORDER — ROCURONIUM BROMIDE 50 MG/5ML IV SOLN
INTRAVENOUS | Status: AC
  Filled 2014-12-17: qty 1

## 2014-12-17 MED ORDER — SUCCINYLCHOLINE CHLORIDE 20 MG/ML IJ SOLN
INTRAMUSCULAR | Status: AC
  Filled 2014-12-17: qty 1

## 2014-12-17 MED ORDER — CHLORHEXIDINE GLUCONATE 4 % EX LIQD: 1.0000 "application " | Freq: Once | CUTANEOUS | Status: DC

## 2014-12-17 MED ORDER — HEPARIN SODIUM (PORCINE) 1000 UNIT/ML IJ SOLN
1000.0000 [IU] | Freq: Once | INTRAMUSCULAR | Status: AC
  Administered 2014-12-17: 1900 [IU] via INTRAVENOUS

## 2014-12-17 MED ORDER — PROTAMINE SULFATE 10 MG/ML IV SOLN
INTRAVENOUS | Status: AC
  Filled 2014-12-17: qty 5

## 2014-12-17 MED ORDER — HEPARIN SODIUM (PORCINE) 1000 UNIT/ML IJ SOLN
INTRAMUSCULAR | Status: AC
  Filled 2014-12-17: qty 1

## 2014-12-17 MED ORDER — MIDAZOLAM HCL 2 MG/2ML IJ SOLN
INTRAMUSCULAR | Status: AC
  Filled 2014-12-17: qty 4

## 2014-12-17 MED ORDER — LIDOCAINE HCL (PF) 1 % IJ SOLN
INTRAMUSCULAR | Status: DC | PRN
  Administered 2014-12-17: 3 mL via INTRADERMAL
  Administered 2014-12-17: 30 mL

## 2014-12-17 MED ORDER — PROPOFOL 10 MG/ML IV BOLUS
INTRAVENOUS | Status: AC
  Filled 2014-12-17: qty 20

## 2014-12-17 MED ORDER — 0.9 % SODIUM CHLORIDE (POUR BTL) OPTIME
TOPICAL | Status: DC | PRN
  Administered 2014-12-17: 1000 mL

## 2014-12-17 MED ORDER — MIDAZOLAM HCL 5 MG/5ML IJ SOLN
INTRAMUSCULAR | Status: DC | PRN
  Administered 2014-12-17 (×2): 1 mg via INTRAVENOUS

## 2014-12-17 MED ORDER — HEPARIN SODIUM (PORCINE) 5000 UNIT/ML IJ SOLN
INTRAMUSCULAR | Status: DC | PRN
  Administered 2014-12-17: 500 mL

## 2014-12-17 MED ORDER — FENTANYL CITRATE (PF) 100 MCG/2ML IJ SOLN: 25.0000 ug | INTRAMUSCULAR | Status: DC | PRN

## 2014-12-17 MED ORDER — HEPARIN SODIUM (PORCINE) 1000 UNIT/ML IJ SOLN
INTRAMUSCULAR | Status: DC | PRN
  Administered 2014-12-17: 5000 [IU] via INTRAVENOUS

## 2014-12-17 MED ORDER — DEXMEDETOMIDINE HCL IN NACL 200 MCG/50ML IV SOLN
INTRAVENOUS | Status: DC | PRN
  Administered 2014-12-17: 0.4 ug/kg/h via INTRAVENOUS

## 2014-12-17 MED ORDER — FENTANYL CITRATE (PF) 250 MCG/5ML IJ SOLN
INTRAMUSCULAR | Status: AC
  Filled 2014-12-17: qty 5

## 2014-12-17 MED ORDER — OXYCODONE-ACETAMINOPHEN 10-325 MG PO TABS: 1.0000 | ORAL_TABLET | ORAL | Status: AC | PRN

## 2014-12-17 MED ORDER — SODIUM CHLORIDE 0.9 % IV SOLN
INTRAVENOUS | Status: DC | PRN
  Administered 2014-12-17: 07:00:00 via INTRAVENOUS

## 2014-12-17 MED ORDER — SODIUM CHLORIDE 0.9 % IV SOLN: INTRAVENOUS | Status: DC

## 2014-12-17 MED ORDER — OXYCODONE HCL 5 MG PO TABS: 5.0000 mg | ORAL_TABLET | Freq: Once | ORAL | Status: DC | PRN

## 2014-12-17 MED ORDER — LIDOCAINE HCL (PF) 1 % IJ SOLN
INTRAMUSCULAR | Status: AC
  Filled 2014-12-17: qty 30

## 2014-12-17 MED ORDER — DEXMEDETOMIDINE HCL IN NACL 200 MCG/50ML IV SOLN
INTRAVENOUS | Status: AC
  Filled 2014-12-17: qty 50

## 2014-12-17 MED ORDER — THROMBIN 20000 UNITS EX SOLR
CUTANEOUS | Status: AC
  Filled 2014-12-17: qty 20000

## 2014-12-17 MED ORDER — ONDANSETRON HCL 4 MG/2ML IJ SOLN: 4.0000 mg | Freq: Four times a day (QID) | INTRAMUSCULAR | Status: DC | PRN

## 2014-12-17 MED ORDER — OXYCODONE HCL 5 MG/5ML PO SOLN: 5.0000 mg | Freq: Once | ORAL | Status: DC | PRN

## 2014-12-17 MED ORDER — ONDANSETRON HCL 4 MG/2ML IJ SOLN
INTRAMUSCULAR | Status: AC
  Filled 2014-12-17: qty 2

## 2014-12-17 MED ORDER — FENTANYL CITRATE (PF) 100 MCG/2ML IJ SOLN
INTRAMUSCULAR | Status: DC | PRN
  Administered 2014-12-17 (×2): 25 ug via INTRAVENOUS

## 2014-12-17 MED ORDER — DEXTROSE 5 % IV SOLN
1.5000 g | INTRAVENOUS | Status: AC
  Administered 2014-12-17: 1.5 g via INTRAVENOUS
  Filled 2014-12-17: qty 1.5

## 2014-12-17 MED ORDER — ONDANSETRON HCL 4 MG/2ML IJ SOLN
INTRAMUSCULAR | Status: DC | PRN
  Administered 2014-12-17: 4 mg via INTRAVENOUS

## 2014-12-17 SURGICAL SUPPLY — 39 items
ARMBAND PINK RESTRICT EXTREMIT (MISCELLANEOUS) ×3 IMPLANT
CANISTER SUCTION 2500CC (MISCELLANEOUS) ×3 IMPLANT
CANNULA VESSEL 3MM 2 BLNT TIP (CANNULA) ×3 IMPLANT
CLIP TI MEDIUM 6 (CLIP) ×3 IMPLANT
CLIP TI WIDE RED SMALL 6 (CLIP) ×3 IMPLANT
CLOTH BEACON ORANGE TIMEOUT ST (SAFETY) ×3 IMPLANT
COVER PROBE W GEL 5X96 (DRAPES) IMPLANT
DECANTER SPIKE VIAL GLASS SM (MISCELLANEOUS) IMPLANT
DRAIN PENROSE 1/4X12 LTX STRL (WOUND CARE) ×3 IMPLANT
ELECT REM PT RETURN 9FT ADLT (ELECTROSURGICAL) ×3
ELECTRODE REM PT RTRN 9FT ADLT (ELECTROSURGICAL) ×2 IMPLANT
GEL ULTRASOUND 20GR AQUASONIC (MISCELLANEOUS) ×3 IMPLANT
GLOVE BIO SURGEON STRL SZ 6.5 (GLOVE) ×3 IMPLANT
GLOVE BIO SURGEON STRL SZ7.5 (GLOVE) ×3 IMPLANT
GLOVE BIOGEL PI IND STRL 6.5 (GLOVE) ×2 IMPLANT
GLOVE BIOGEL PI IND STRL 7.5 (GLOVE) ×2 IMPLANT
GLOVE BIOGEL PI INDICATOR 6.5 (GLOVE) ×1
GLOVE BIOGEL PI INDICATOR 7.5 (GLOVE) ×1
GLOVE ECLIPSE 7.0 STRL STRAW (GLOVE) ×3 IMPLANT
GOWN STRL REUS W/ TWL LRG LVL3 (GOWN DISPOSABLE) ×4 IMPLANT
GOWN STRL REUS W/ TWL XL LVL3 (GOWN DISPOSABLE) ×2 IMPLANT
GOWN STRL REUS W/TWL LRG LVL3 (GOWN DISPOSABLE) ×2
GOWN STRL REUS W/TWL XL LVL3 (GOWN DISPOSABLE) ×1
KIT BASIN OR (CUSTOM PROCEDURE TRAY) ×3 IMPLANT
KIT ROOM TURNOVER OR (KITS) ×3 IMPLANT
LIQUID BAND (GAUZE/BANDAGES/DRESSINGS) ×6 IMPLANT
LOOP VESSEL MINI RED (MISCELLANEOUS) IMPLANT
NS IRRIG 1000ML POUR BTL (IV SOLUTION) ×3 IMPLANT
PACK CV ACCESS (CUSTOM PROCEDURE TRAY) ×3 IMPLANT
PAD ARMBOARD 7.5X6 YLW CONV (MISCELLANEOUS) ×6 IMPLANT
SPONGE SURGIFOAM ABS GEL 100 (HEMOSTASIS) IMPLANT
SUT PROLENE 6 0 CC (SUTURE) ×3 IMPLANT
SUT PROLENE 7 0 BV 1 (SUTURE) ×3 IMPLANT
SUT SILK 0 (SUTURE) IMPLANT
SUT VIC AB 3-0 SH 27 (SUTURE) ×1
SUT VIC AB 3-0 SH 27X BRD (SUTURE) ×2 IMPLANT
SUT VICRYL 4-0 PS2 18IN ABS (SUTURE) ×6 IMPLANT
UNDERPAD 30X30 INCONTINENT (UNDERPADS AND DIAPERS) ×3 IMPLANT
WATER STERILE IRR 1000ML POUR (IV SOLUTION) IMPLANT

## 2014-12-17 NOTE — Transfer of Care (Signed)
Immediate Anesthesia Transfer of Care Note  Patient: Larry [name]  Procedure(s) Performed: Procedure(s): ARTERIOVENOUS (AV) FISTULA CREATION-right brachial-cephalic (Right) LIGATION OF ARTERIOVENOUS  FISTULA-right radial-cephalic (Right)  Patient Location: PACU  Anesthesia Type:MAC  Level of Consciousness: awake, alert  and oriented  Airway & Oxygen Therapy: Patient Spontanous Breathing and Patient connected to face mask oxygen  Post-op Assessment: Report given to RN, Post -op Vital signs reviewed and stable and Patient moving all extremities X 4  Post vital signs: Reviewed and stable  Last Vitals:  Filed Vitals:   12/17/14 0620  Pulse: 76  Temp: 36 C  Resp: 18    Complications: No apparent anesthesia complications

## 2014-12-17 NOTE — Op Note (Signed)
Procedure: Right Brachial Cephalic AV fistula, ligation of right radial cephalic AVF  Preop: ESRD  Postop: ESRD  Anesthesia: Local with MAC  Assistant:Maureen Collins, PA-C  Findings: 3.5 mm cephalic vein  Procedure: After obtaining informed consent, the patient was taken to the operating room.  After adequate sedation, the left upper extremity was prepped and draped in usual sterile fashion.  Local anesthesia was infiltrated at the level of the wrist over a pre-existing radial cephalic AV fistula.  A longitudinal incision was made over the pre-existing scar and carried down to the level of the fistula.  This was dissected free circumferentially and doubly ligated with a 3 0 silk tie.  Local anesthesia was then infiltrated at the antecubital crease.  A transverse incision was then made near the antecubital crease the right arm. The incision was carried into the subcutaneous tissues down to level of the cephalic vein. The cephalic vein was approximately 3.5 mm in diameter. It was of good quality. This was dissected free circumferentially and small side branches ligated and divided between silk ties or clips. Next the brachial artery was dissected free in the medial portion of the incision. The artery was  3-4 mm in diameter. The vessel loops were placed proximal and distal to the planned site of arteriotomy. The patient was given 5000 units of intravenous heparin. After appropriate circulation time, the vessel loops were used to control the artery. A longitudinal opening was made in the brachial artery.  The vein was ligated distally with a 2-0 silk tie. The vein was controlled proximally with a fine bulldog clamp. The vein was then swung over to the artery and sewn end of vein to side of artery using a running 7-0 Prolene suture. Just prior to completion of the anastomosis, everything was fore bled back bled and thoroughly flushed. The anastomosis was secured, vessel loops released, and there was a  palpable thrill in the fistula immediately. After hemostasis was obtained, the subcutaneous tissues were reapproximated using a running 3-0 Vicryl suture. The skin was then closed with a 4 0 Vicryl subcuticular stitch. Dermabond was applied to the skin incision.  The patient had an audible radial doppler signal at the end of the case.  Ruta Hinds, MD [facility] [place] Office: [phone] Pager: [phone]

## 2014-12-17 NOTE — Anesthesia Postprocedure Evaluation (Signed)
Anesthesia Post Note  Patient: Larry [name]  Procedure(s) Performed: Procedure(s) (LRB): ARTERIOVENOUS (AV) FISTULA CREATION-right brachial-cephalic (Right) LIGATION OF ARTERIOVENOUS  FISTULA-right radial-cephalic (Right)  Anesthesia type: MAC  Patient location: PACU  Post pain: Pain level controlled and Adequate analgesia  Post assessment: Post-op Vital signs reviewed, Patient's Cardiovascular Status Stable and Respiratory Function Stable  Last Vitals:  Filed Vitals:   12/17/14 1001  BP:   Pulse: 71  Temp:   Resp:     Post vital signs: Reviewed and stable  Level of consciousness: awake, alert  and oriented  Complications: No apparent anesthesia complications

## 2014-12-17 NOTE — Anesthesia Procedure Notes (Signed)
Procedure Name: MAC Date/Time: 12/17/2014 7:40 AM Performed by: Garrison Columbus T Pre-anesthesia Checklist: Patient identified, Emergency Drugs available, Suction available and Patient being monitored Patient Re-evaluated:Patient Re-evaluated prior to inductionOxygen Delivery Method: Simple face mask Preoxygenation: Pre-oxygenation with 100% oxygen Intubation Type: IV induction Placement Confirmation: positive ETCO2 and breath sounds checked- equal and bilateral Dental Injury: Teeth and Oropharynx as per pre-operative assessment

## 2014-12-17 NOTE — Progress Notes (Signed)
Called report to [employer] at [facility].

## 2014-12-17 NOTE — Progress Notes (Signed)
Report to M. Brande RN as primary caregiver 

## 2014-12-17 NOTE — Interval H&P Note (Signed)
History and Physical Interval Note:  12/17/2014 7:23 AM  Larry [name]  has presented today for surgery, with the diagnosis of End Stage Renal Disease G18.2 Mechanical Complication of right arm Arteriovenous Fistula T82.510 D  The various methods of treatment have been discussed with the patient and family. After consideration of risks, benefits and other options for treatment, the patient has consented to  Procedure(s): ARTERIOVENOUS (AV) FISTULA CREATION-right brachial-cephalic (Right) LIGATION OF ARTERIOVENOUS  FISTULA-right radial-cephalic (Right) as a surgical intervention .  The patient's history has been reviewed, patient examined, no change in status, stable for surgery.  I have reviewed the patient's chart and labs.  Questions were answered to the patient's satisfaction.     Ruta Hinds

## 2014-12-17 NOTE — Progress Notes (Signed)
IV has been consulted to cap off HD cath per Ileene Rubens RN

## 2014-12-17 NOTE — Progress Notes (Signed)
Pt has pre-existing HD cath. RU chest- blue port is accessed, red is capped and clamped. Gauze dsg CDI.

## 2014-12-17 NOTE — H&P (View-Only) (Signed)
POST OPERATIVE OFFICE NOTE    CC:  F/u for surgery  HPI:  This is a 64 y.o. male who is s/p who underwent right radial cephalic AVF on 03/06/49 by Dr. Oneida Alar.  He was recently in the hospital after falling and breaking his femur and fracturing ribs and requiring chest tubes for hemothorax and PTX.  He presents today for poorly maturing AVF.  He currently dialyzes M/W/F/S.  He still resides at the rehab center, but hopes to get home in the near future.    Allergies  Allergen Reactions  . Bydureon [Exenatide] Other (See Comments)    sweating  . Losartan Potassium Other (See Comments)    insomnia  . Gabapentin Other (See Comments)    confusion    Current Outpatient Prescriptions  Medication Sig Dispense Refill  . acetaminophen (TYLENOL) 325 MG tablet Take 650 mg by mouth every 4 (four) hours as needed for moderate pain or fever.    . ALPRAZolam (XANAX) 0.5 MG tablet Take 0.5 mg by mouth every 8 (eight) hours as needed for anxiety.    [name] amiodarone (PACERONE) 400 MG tablet Take 0.5 tablets (200 mg total) by mouth daily. 60 tablet 6  . atorvastatin (LIPITOR) 40 MG tablet TAKE 1 TABLET DAILY 90 tablet 3  . bisacodyl (DULCOLAX) 10 MG suppository Place 1 suppository (10 mg total) rectally daily as needed for moderate constipation. 12 suppository 0  . chlorhexidine (PERIDEX) 0.12 % solution Perform mouth rinses twice daily after breakfast and at bedtime. (Patient taking differently: Use as directed in the mouth or throat 2 (two) times daily. Perform mouth rinses twice daily after breakfast and at bedtime (swish and spit)) 120 mL 0  . DULoxetine (CYMBALTA) 30 MG capsule Take 60 mg by mouth 2 (two) times daily.     . insulin glargine (LANTUS) 100 unit/mL SOPN Inject 0.1 mLs (10 Units total) into the skin at bedtime. 15 mL 11  . insulin lispro (HUMALOG KWIKPEN) 100 UNIT/ML KiwkPen Inject 5-25 Units into the skin 3 (three) times daily as needed.     [name] levothyroxine (SYNTHROID, LEVOTHROID) 75 MCG  tablet TAKE 1 TABLET ON AN EMPTY STOMACH 30 MINUTES BEFORE BREAKFAST  5  . midodrine (PROAMATINE) 10 MG tablet Take 1 tablet (10 mg total) by mouth 3 (three) times daily. 90 tablet 6  . multivitamin (RENA-VIT) TABS tablet Take 1 tablet by mouth at bedtime. 30 tablet 6  . ondansetron (ZOFRAN) 4 MG tablet Take 1 tablet (4 mg total) by mouth every 8 (eight) hours as needed for nausea or vomiting. 30 tablet 0  . oxyCODONE-acetaminophen (PERCOCET) 10-325 MG per tablet Take 1 tablet by mouth every 4 (four) hours as needed for pain. (Patient taking differently: Take 1-2 tablets by mouth every 4 (four) hours as needed for pain. ) 120 tablet 0  . polyethylene glycol (MIRALAX / GLYCOLAX) packet Take 17 g by mouth daily as needed for moderate constipation. 14 each 0  . sevelamer carbonate (RENVELA) 800 MG tablet Take 800 mg by mouth 3 (three) times daily with meals.    . sildenafil (REVATIO) 20 MG tablet Take 4 tablets (80 mg total) by mouth 3 (three) times daily. 360 tablet 1  . traZODone (DESYREL) 50 MG tablet Take 50 mg by mouth at bedtime.    Alveda Reasons 15 MG TABS tablet TAKE 1 TABLET EVERY DAY WITH SUPPER 30 tablet 3   No current facility-administered medications for this visit.     ROS:  See HPI  Physical Exam:  Filed Vitals:   12/05/14 1602  BP: 99/65  Pulse: 70    Incision:  Well healed with spitting stitch. Extremities:  2+ right radial pulse; + thrill/bruit within fistula; strong handgrip; sensation and motor are in tact.  Right IJ diatek catheter in place  Assessment/Plan:  This is a 64 y.o. male who is s/p: Right RC AVF on 10/01/14 that is poorly maturing.  -diameters on duplex 11/28/14 reveal 0.35cm-0.59cm.  Given the fistula needs to be at least 0.6cm, we will plan a fistulogram on 12/13/14. -verified with pt & family member that he is not on Xarelto or any blood thinner at this time.  -spitting stitch removed today  Leontine Locket, PA-C [facility] [phone]  Clinic MD:  Pt seen and examined with Dr. Oneida Alar  History and exam findings as above. Patient has poorly maturing right radiocephalic AV fistula. His duplex ultrasound suggested maybe anastomotic narrowing but otherwise the vein is just small. We will schedule him for a fistulogram to see if it is salvageable. Otherwise we will need to consider a new access.  Ruta Hinds, MD [facility] [place] Office: [phone] Pager: [phone]

## 2014-12-17 NOTE — Anesthesia Preprocedure Evaluation (Addendum)
Anesthesia Evaluation  Patient identified by MRN, date of birth, ID band Patient awake    Reviewed: Allergy & Precautions, NPO status , Patient's Chart, lab work & pertinent test results  Airway Mallampati: II  TM Distance: >3 FB Neck ROM: full    Dental  (+) Edentulous Upper, Edentulous Lower   Pulmonary shortness of breath, with exertion and at rest, sleep apnea , COPD, former smoker,    breath sounds clear to auscultation       Cardiovascular hypertension, Pt. on medications + CAD, + Past MI, + Peripheral Vascular Disease and +CHF  + dysrhythmias Atrial Fibrillation and Ventricular Tachycardia + pacemaker + Cardiac Defibrillator  Rhythm:regular Rate:Normal     Neuro/Psych PSYCHIATRIC DISORDERS Anxiety Depression  Neuromuscular disease CVA, No Residual Symptoms    GI/Hepatic   Endo/Other  diabetes, Type 2, Insulin DependentHypothyroidism   Renal/GU ESRF and DialysisRenal disease     Musculoskeletal  (+) Arthritis ,   Abdominal   Peds  Hematology   Anesthesia Other Findings   Reproductive/Obstetrics                            Anesthesia Physical Anesthesia Plan  ASA: IV  Anesthesia Plan: MAC   Post-op Pain Management:    Induction: Intravenous  Airway Management Planned: Simple Face Mask and Natural Airway  Additional Equipment:   Intra-op Plan:   Post-operative Plan:   Informed Consent: I have reviewed the patients History and Physical, chart, labs and discussed the procedure including the risks, benefits and alternatives for the proposed anesthesia with the patient or authorized representative who has indicated his/her understanding and acceptance.     Plan Discussed with: CRNA, Anesthesiologist and Surgeon  Anesthesia Plan Comments:        Anesthesia Quick Evaluation

## 2014-12-18 ENCOUNTER — Encounter (HOSPITAL_COMMUNITY): Payer: Self-pay | Admitting: Vascular Surgery

## 2014-12-18 ENCOUNTER — Telehealth: Payer: Self-pay | Admitting: Vascular Surgery

## 2014-12-18 NOTE — Telephone Encounter (Addendum)
-----   Message from Denman George, RN sent at 12/17/2014  2:33 PM EST ----- Regarding: 6 wk f/u with [facility]/ needs right arm access duplex   ----- Message -----    From: Ulyses Amor, PA-C    Sent: 12/17/2014   9:07 AM      To: [facility] Charge Pool  F/U with Dr. Oneida Alar in 6 weeks with fistula duplex s/p right upper arm av fistula  notified patient's wife of post op appt. on 02-13-15 3:00/3:45pm

## 2014-12-25 LAB — CUP PACEART REMOTE DEVICE CHECK
Brady Statistic RA Percent Paced: 2.3 %
Date Time Interrogation Session: 20161116124128
HIGH POWER IMPEDANCE MEASURED VALUE: 53 Ohm
Implantable Lead Implant Date: 20120112
Implantable Lead Location: 753859
Lead Channel Impedance Value: 330 Ohm
Lead Channel Sensing Intrinsic Amplitude: 1.5 mV
Lead Channel Setting Pacing Amplitude: 2.5 V
Lead Channel Setting Pacing Pulse Width: 0.5 ms
MDC IDC LEAD IMPLANT DT: 20120112
MDC IDC LEAD LOCATION: 753860
MDC IDC MSMT BATTERY REMAINING LONGEVITY: 42 mo
MDC IDC MSMT LEADCHNL RA IMPEDANCE VALUE: 360 Ohm
MDC IDC MSMT LEADCHNL RV SENSING INTR AMPL: 10.9 mV
MDC IDC SET LEADCHNL RA PACING AMPLITUDE: 2 V
MDC IDC SET LEADCHNL RV SENSING SENSITIVITY: 0.5 mV
MDC IDC STAT BRADY RV PERCENT PACED: 28 %
Pulse Gen Serial Number: 622169

## 2014-12-26 ENCOUNTER — Encounter: Payer: Self-pay | Admitting: Cardiology

## 2014-12-26 ENCOUNTER — Ambulatory Visit: Payer: Self-pay | Admitting: Surgery

## 2015-01-01 ENCOUNTER — Telehealth: Payer: Self-pay | Admitting: Internal Medicine

## 2015-01-01 NOTE — Telephone Encounter (Signed)
Informed pt wife that his remote transmission was received on 12-10-14. Pt wife verbalized understanding.

## 2015-01-01 NOTE — Telephone Encounter (Signed)
New message      Did you receive patient's remote check?

## 2015-01-05 ENCOUNTER — Encounter (HOSPITAL_COMMUNITY): Payer: Self-pay | Admitting: *Deleted

## 2015-01-05 ENCOUNTER — Observation Stay (HOSPITAL_COMMUNITY)
Admission: EM | Admit: 2015-01-05 | Discharge: 2015-01-07 | Disposition: A | Discharge status: Home / Self Care | Payer: Commercial Managed Care - HMO | Attending: Internal Medicine | Admitting: Internal Medicine

## 2015-01-05 DIAGNOSIS — J449 Chronic obstructive pulmonary disease, unspecified: Secondary | ICD-10-CM | POA: Insufficient documentation

## 2015-01-05 DIAGNOSIS — Z95810 Presence of automatic (implantable) cardiac defibrillator: Secondary | ICD-10-CM | POA: Diagnosis present

## 2015-01-05 DIAGNOSIS — E7800 Pure hypercholesterolemia, unspecified: Secondary | ICD-10-CM | POA: Diagnosis not present

## 2015-01-05 DIAGNOSIS — I2510 Atherosclerotic heart disease of native coronary artery without angina pectoris: Secondary | ICD-10-CM | POA: Insufficient documentation

## 2015-01-05 DIAGNOSIS — L89150 Pressure ulcer of sacral region, unstageable: Secondary | ICD-10-CM | POA: Insufficient documentation

## 2015-01-05 DIAGNOSIS — I132 Hypertensive heart and chronic kidney disease with heart failure and with stage 5 chronic kidney disease, or end stage renal disease: Secondary | ICD-10-CM | POA: Insufficient documentation

## 2015-01-05 DIAGNOSIS — I272 Other secondary pulmonary hypertension: Secondary | ICD-10-CM | POA: Diagnosis not present

## 2015-01-05 DIAGNOSIS — F4310 Post-traumatic stress disorder, unspecified: Secondary | ICD-10-CM | POA: Diagnosis not present

## 2015-01-05 DIAGNOSIS — Z794 Long term (current) use of insulin: Secondary | ICD-10-CM | POA: Diagnosis not present

## 2015-01-05 DIAGNOSIS — E1142 Type 2 diabetes mellitus with diabetic polyneuropathy: Secondary | ICD-10-CM | POA: Insufficient documentation

## 2015-01-05 DIAGNOSIS — E113299 Type 2 diabetes mellitus with mild nonproliferative diabetic retinopathy without macular edema, unspecified eye: Secondary | ICD-10-CM | POA: Diagnosis not present

## 2015-01-05 DIAGNOSIS — F419 Anxiety disorder, unspecified: Secondary | ICD-10-CM | POA: Insufficient documentation

## 2015-01-05 DIAGNOSIS — E1122 Type 2 diabetes mellitus with diabetic chronic kidney disease: Secondary | ICD-10-CM | POA: Diagnosis not present

## 2015-01-05 DIAGNOSIS — R55 Syncope and collapse: Principal | ICD-10-CM | POA: Insufficient documentation

## 2015-01-05 DIAGNOSIS — N186 End stage renal disease: Secondary | ICD-10-CM | POA: Insufficient documentation

## 2015-01-05 DIAGNOSIS — I480 Paroxysmal atrial fibrillation: Secondary | ICD-10-CM | POA: Insufficient documentation

## 2015-01-05 DIAGNOSIS — E1151 Type 2 diabetes mellitus with diabetic peripheral angiopathy without gangrene: Secondary | ICD-10-CM | POA: Diagnosis not present

## 2015-01-05 DIAGNOSIS — R569 Unspecified convulsions: Secondary | ICD-10-CM

## 2015-01-05 DIAGNOSIS — Z992 Dependence on renal dialysis: Secondary | ICD-10-CM | POA: Insufficient documentation

## 2015-01-05 DIAGNOSIS — I872 Venous insufficiency (chronic) (peripheral): Secondary | ICD-10-CM | POA: Diagnosis not present

## 2015-01-05 DIAGNOSIS — Z6828 Body mass index (BMI) 28.0-28.9, adult: Secondary | ICD-10-CM | POA: Diagnosis not present

## 2015-01-05 DIAGNOSIS — H5702 Anisocoria: Secondary | ICD-10-CM

## 2015-01-05 DIAGNOSIS — Z87891 Personal history of nicotine dependence: Secondary | ICD-10-CM | POA: Diagnosis not present

## 2015-01-05 DIAGNOSIS — I428 Other cardiomyopathies: Secondary | ICD-10-CM | POA: Diagnosis not present

## 2015-01-05 DIAGNOSIS — L89623 Pressure ulcer of left heel, stage 3: Secondary | ICD-10-CM | POA: Insufficient documentation

## 2015-01-05 DIAGNOSIS — Z79899 Other long term (current) drug therapy: Secondary | ICD-10-CM | POA: Insufficient documentation

## 2015-01-05 DIAGNOSIS — G4733 Obstructive sleep apnea (adult) (pediatric): Secondary | ICD-10-CM | POA: Diagnosis not present

## 2015-01-05 DIAGNOSIS — I5023 Acute on chronic systolic (congestive) heart failure: Secondary | ICD-10-CM

## 2015-01-05 DIAGNOSIS — Z8673 Personal history of transient ischemic attack (TIA), and cerebral infarction without residual deficits: Secondary | ICD-10-CM | POA: Diagnosis not present

## 2015-01-05 DIAGNOSIS — I482 Chronic atrial fibrillation, unspecified: Secondary | ICD-10-CM

## 2015-01-05 DIAGNOSIS — E039 Hypothyroidism, unspecified: Secondary | ICD-10-CM | POA: Insufficient documentation

## 2015-01-05 DIAGNOSIS — F329 Major depressive disorder, single episode, unspecified: Secondary | ICD-10-CM | POA: Diagnosis not present

## 2015-01-05 DIAGNOSIS — L8990 Pressure ulcer of unspecified site, unspecified stage: Secondary | ICD-10-CM | POA: Diagnosis present

## 2015-01-05 DIAGNOSIS — I5022 Chronic systolic (congestive) heart failure: Secondary | ICD-10-CM | POA: Diagnosis not present

## 2015-01-05 DIAGNOSIS — I4819 Other persistent atrial fibrillation: Secondary | ICD-10-CM | POA: Diagnosis present

## 2015-01-05 DIAGNOSIS — I481 Persistent atrial fibrillation: Secondary | ICD-10-CM | POA: Diagnosis not present

## 2015-01-05 DIAGNOSIS — D472 Monoclonal gammopathy: Secondary | ICD-10-CM | POA: Insufficient documentation

## 2015-01-05 DIAGNOSIS — I252 Old myocardial infarction: Secondary | ICD-10-CM | POA: Insufficient documentation

## 2015-01-05 DIAGNOSIS — R9431 Abnormal electrocardiogram [ECG] [EKG]: Secondary | ICD-10-CM

## 2015-01-05 DIAGNOSIS — E119 Type 2 diabetes mellitus without complications: Secondary | ICD-10-CM

## 2015-01-05 DIAGNOSIS — I10 Essential (primary) hypertension: Secondary | ICD-10-CM | POA: Diagnosis present

## 2015-01-05 DIAGNOSIS — I9589 Other hypotension: Secondary | ICD-10-CM

## 2015-01-05 DIAGNOSIS — E669 Obesity, unspecified: Secondary | ICD-10-CM | POA: Insufficient documentation

## 2015-01-05 HISTORY — DX: Syncope and collapse: R55

## 2015-01-05 HISTORY — DX: Pressure ulcer of sacral region, stage 3: L89.153

## 2015-01-05 HISTORY — DX: Unspecified malignant neoplasm of skin of unspecified upper limb, including shoulder: C44.601

## 2015-01-05 NOTE — ED Notes (Signed)
The pt fainted earlier tonight.  The pts wife called 52 and the ems arrived but the pt  Refused to  Go with them to the hospital.  No sob  Alert at present

## 2015-01-06 ENCOUNTER — Emergency Department (HOSPITAL_COMMUNITY): Payer: Commercial Managed Care - HMO

## 2015-01-06 ENCOUNTER — Observation Stay (HOSPITAL_COMMUNITY): Payer: Commercial Managed Care - HMO

## 2015-01-06 ENCOUNTER — Encounter (HOSPITAL_COMMUNITY): Payer: Self-pay | Admitting: Family Medicine

## 2015-01-06 DIAGNOSIS — Z95810 Presence of automatic (implantable) cardiac defibrillator: Secondary | ICD-10-CM | POA: Diagnosis not present

## 2015-01-06 DIAGNOSIS — R55 Syncope and collapse: Secondary | ICD-10-CM | POA: Diagnosis not present

## 2015-01-06 DIAGNOSIS — G4733 Obstructive sleep apnea (adult) (pediatric): Secondary | ICD-10-CM

## 2015-01-06 DIAGNOSIS — E118 Type 2 diabetes mellitus with unspecified complications: Secondary | ICD-10-CM | POA: Diagnosis not present

## 2015-01-06 DIAGNOSIS — I5022 Chronic systolic (congestive) heart failure: Secondary | ICD-10-CM | POA: Diagnosis not present

## 2015-01-06 LAB — BASIC METABOLIC PANEL
ANION GAP: 9 (ref 5–15)
Anion gap: 10 (ref 5–15)
BUN: 24 mg/dL — AB (ref 6–20)
BUN: 24 mg/dL — AB (ref 6–20)
CALCIUM: 8.4 mg/dL — AB (ref 8.9–10.3)
CHLORIDE: 98 mmol/L — AB (ref 101–111)
CO2: 28 mmol/L (ref 22–32)
CO2: 29 mmol/L (ref 22–32)
Calcium: 8.5 mg/dL — ABNORMAL LOW (ref 8.9–10.3)
Chloride: 97 mmol/L — ABNORMAL LOW (ref 101–111)
Creatinine, Ser: 3.49 mg/dL — ABNORMAL HIGH (ref 0.61–1.24)
Creatinine, Ser: 3.64 mg/dL — ABNORMAL HIGH (ref 0.61–1.24)
GFR calc Af Amer: 19 mL/min — ABNORMAL LOW (ref >60)
GFR calc Af Amer: 20 mL/min — ABNORMAL LOW (ref >60)
GFR calc non Af Amer: 16 mL/min — ABNORMAL LOW (ref >60)
GFR, EST NON AFRICAN AMERICAN: 17 mL/min — AB (ref >60)
GLUCOSE: 138 mg/dL — AB (ref 65–99)
Glucose, Bld: 167 mg/dL — ABNORMAL HIGH (ref 65–99)
POTASSIUM: 3.2 mmol/L — AB (ref 3.5–5.1)
POTASSIUM: 3.2 mmol/L — AB (ref 3.5–5.1)
SODIUM: 135 mmol/L (ref 135–145)
Sodium: 136 mmol/L (ref 135–145)

## 2015-01-06 LAB — CBC
HEMATOCRIT: 34.4 % — AB (ref 39.0–52.0)
HEMATOCRIT: 34.4 % — AB (ref 39.0–52.0)
HEMOGLOBIN: 10.7 g/dL — AB (ref 13.0–17.0)
Hemoglobin: 10.7 g/dL — ABNORMAL LOW (ref 13.0–17.0)
MCH: 32.7 pg (ref 26.0–34.0)
MCH: 32.8 pg (ref 26.0–34.0)
MCHC: 31.1 g/dL (ref 30.0–36.0)
MCHC: 31.1 g/dL (ref 30.0–36.0)
MCV: 105.2 fL — AB (ref 78.0–100.0)
MCV: 105.5 fL — ABNORMAL HIGH (ref 78.0–100.0)
Platelets: 128 10*3/uL — ABNORMAL LOW (ref 150–400)
Platelets: 129 10*3/uL — ABNORMAL LOW (ref 150–400)
RBC: 3.26 MIL/uL — ABNORMAL LOW (ref 4.22–5.81)
RBC: 3.27 MIL/uL — ABNORMAL LOW (ref 4.22–5.81)
RDW: 19.4 % — AB (ref 11.5–15.5)
RDW: 19.6 % — AB (ref 11.5–15.5)
WBC: 5.8 10*3/uL (ref 4.0–10.5)
WBC: 5.9 10*3/uL (ref 4.0–10.5)

## 2015-01-06 LAB — GLUCOSE, CAPILLARY
GLUCOSE-CAPILLARY: 127 mg/dL — AB (ref 65–99)
GLUCOSE-CAPILLARY: 89 mg/dL (ref 65–99)
GLUCOSE-CAPILLARY: 87 mg/dL (ref 65–99)
GLUCOSE-CAPILLARY: 102 mg/dL — AB (ref 65–99)

## 2015-01-06 LAB — CBG MONITORING, ED: GLUCOSE-CAPILLARY: 151 mg/dL — AB (ref 65–99)

## 2015-01-06 LAB — I-STAT CG4 LACTIC ACID, ED: LACTIC ACID, VENOUS: 2.09 mmol/L — AB (ref 0.5–2.0)

## 2015-01-06 LAB — MRSA PCR SCREENING: MRSA BY PCR: NEGATIVE

## 2015-01-06 LAB — TROPONIN I: TROPONIN I: 0.07 ng/mL — AB (ref <0.031)

## 2015-01-06 MED ORDER — INSULIN ASPART 100 UNIT/ML ~~LOC~~ SOLN
5.0000 [IU] | Freq: Three times a day (TID) | SUBCUTANEOUS | Status: DC
  Administered 2015-01-06: 5 [IU] via SUBCUTANEOUS

## 2015-01-06 MED ORDER — POTASSIUM CHLORIDE CRYS ER 20 MEQ PO TBCR
40.0000 meq | EXTENDED_RELEASE_TABLET | Freq: Once | ORAL | Status: AC
  Administered 2015-01-06: 40 meq via ORAL
  Filled 2015-01-06: qty 2

## 2015-01-06 MED ORDER — INSULIN ASPART 100 UNIT/ML ~~LOC~~ SOLN: 5.0000 [IU] | Freq: Three times a day (TID) | SUBCUTANEOUS | Status: DC

## 2015-01-06 MED ORDER — ONDANSETRON HCL 4 MG PO TABS
4.0000 mg | ORAL_TABLET | Freq: Three times a day (TID) | ORAL | Status: DC | PRN
  Administered 2015-01-07: 4 mg via ORAL
  Filled 2015-01-06 (×2): qty 1

## 2015-01-06 MED ORDER — BISACODYL 10 MG RE SUPP: 10.0000 mg | Freq: Every day | RECTAL | Status: DC | PRN

## 2015-01-06 MED ORDER — OXYCODONE-ACETAMINOPHEN 5-325 MG PO TABS
1.0000 | ORAL_TABLET | ORAL | Status: DC | PRN
Start: 2015-01-06 — End: 2015-01-07
  Administered 2015-01-06 – 2015-01-07 (×3): 1 via ORAL
  Filled 2015-01-06 (×3): qty 1

## 2015-01-06 MED ORDER — COLLAGENASE 250 UNIT/GM EX OINT
TOPICAL_OINTMENT | Freq: Every day | CUTANEOUS | Status: DC
  Administered 2015-01-06 – 2015-01-07 (×2): via TOPICAL
  Filled 2015-01-06: qty 30

## 2015-01-06 MED ORDER — MIDODRINE HCL 5 MG PO TABS: 10.0000 mg | ORAL_TABLET | Freq: Three times a day (TID) | ORAL | Status: DC

## 2015-01-06 MED ORDER — SODIUM CHLORIDE 0.9 % IJ SOLN
3.0000 mL | Freq: Two times a day (BID) | INTRAMUSCULAR | Status: DC
  Administered 2015-01-06 – 2015-01-07 (×3): 3 mL via INTRAVENOUS

## 2015-01-06 MED ORDER — AMIODARONE HCL 200 MG PO TABS
200.0000 mg | ORAL_TABLET | Freq: Every day | ORAL | Status: DC
  Administered 2015-01-06 – 2015-01-07 (×2): 200 mg via ORAL
  Filled 2015-01-06 (×2): qty 1

## 2015-01-06 MED ORDER — ALPRAZOLAM 0.5 MG PO TABS
0.5000 mg | ORAL_TABLET | Freq: Three times a day (TID) | ORAL | Status: DC | PRN
  Administered 2015-01-06 – 2015-01-07 (×2): 0.5 mg via ORAL
  Filled 2015-01-06 (×2): qty 1

## 2015-01-06 MED ORDER — OXYCODONE-ACETAMINOPHEN 10-325 MG PO TABS: 1.0000 | ORAL_TABLET | ORAL | Status: DC | PRN

## 2015-01-06 MED ORDER — SODIUM CHLORIDE 0.9 % IV BOLUS (SEPSIS)
500.0000 mL | Freq: Once | INTRAVENOUS | Status: AC
  Administered 2015-01-06: 500 mL via INTRAVENOUS

## 2015-01-06 MED ORDER — SODIUM CHLORIDE 0.9 % IJ SOLN
3.0000 mL | Freq: Two times a day (BID) | INTRAMUSCULAR | Status: DC
  Administered 2015-01-06 – 2015-01-07 (×2): 3 mL via INTRAVENOUS

## 2015-01-06 MED ORDER — ATORVASTATIN CALCIUM 40 MG PO TABS
40.0000 mg | ORAL_TABLET | Freq: Every day | ORAL | Status: DC
  Administered 2015-01-06 – 2015-01-07 (×2): 40 mg via ORAL
  Filled 2015-01-06 (×2): qty 1

## 2015-01-06 MED ORDER — BISACODYL 5 MG PO TBEC
10.0000 mg | DELAYED_RELEASE_TABLET | Freq: Two times a day (BID) | ORAL | Status: DC
  Administered 2015-01-06 – 2015-01-07 (×3): 10 mg via ORAL
  Filled 2015-01-06 (×3): qty 2

## 2015-01-06 MED ORDER — OXYCODONE HCL 5 MG PO TABS
5.0000 mg | ORAL_TABLET | ORAL | Status: DC | PRN
  Administered 2015-01-06 (×2): 5 mg via ORAL
  Administered 2015-01-06 – 2015-01-07 (×2): 10 mg via ORAL
  Administered 2015-01-07: 5 mg via ORAL
  Filled 2015-01-06: qty 1
  Filled 2015-01-06: qty 2
  Filled 2015-01-06: qty 1
  Filled 2015-01-06: qty 2
  Filled 2015-01-06: qty 1

## 2015-01-06 MED ORDER — MIDODRINE HCL 5 MG PO TABS
10.0000 mg | ORAL_TABLET | Freq: Three times a day (TID) | ORAL | Status: DC
  Administered 2015-01-06 – 2015-01-07 (×4): 10 mg via ORAL
  Filled 2015-01-06 (×5): qty 2

## 2015-01-06 MED ORDER — DULOXETINE HCL 60 MG PO CPEP
60.0000 mg | ORAL_CAPSULE | Freq: Two times a day (BID) | ORAL | Status: DC
Start: 2015-01-06 — End: 2015-01-07
  Administered 2015-01-06 – 2015-01-07 (×3): 60 mg via ORAL
  Filled 2015-01-06 (×3): qty 1

## 2015-01-06 MED ORDER — POLYETHYLENE GLYCOL 3350 17 G PO PACK: 17.0000 g | PACK | Freq: Every day | ORAL | Status: DC | PRN

## 2015-01-06 MED ORDER — TRAZODONE HCL 50 MG PO TABS
50.0000 mg | ORAL_TABLET | Freq: Every day | ORAL | Status: DC
Start: 2015-01-06 — End: 2015-01-07
  Administered 2015-01-06: 50 mg via ORAL
  Filled 2015-01-06: qty 1

## 2015-01-06 MED ORDER — LEVOTHYROXINE SODIUM 75 MCG PO TABS
75.0000 ug | ORAL_TABLET | Freq: Every day | ORAL | Status: DC
  Administered 2015-01-06 – 2015-01-07 (×2): 75 ug via ORAL
  Filled 2015-01-06 (×2): qty 1

## 2015-01-06 MED ORDER — SILDENAFIL CITRATE 20 MG PO TABS
80.0000 mg | ORAL_TABLET | Freq: Three times a day (TID) | ORAL | Status: DC
  Administered 2015-01-06 – 2015-01-07 (×4): 80 mg via ORAL
  Filled 2015-01-06 (×6): qty 4

## 2015-01-06 MED ORDER — CALCIUM ACETATE (PHOS BINDER) 667 MG PO CAPS
667.0000 mg | ORAL_CAPSULE | Freq: Three times a day (TID) | ORAL | Status: DC
  Administered 2015-01-06 – 2015-01-07 (×4): 667 mg via ORAL
  Filled 2015-01-06 (×5): qty 1

## 2015-01-06 MED ORDER — INSULIN GLARGINE 100 UNIT/ML ~~LOC~~ SOLN
10.0000 [IU] | Freq: Every day | SUBCUTANEOUS | Status: DC
  Filled 2015-01-06 (×2): qty 0.1

## 2015-01-06 MED ORDER — ACETAMINOPHEN 325 MG PO TABS: 650.0000 mg | ORAL_TABLET | ORAL | Status: DC | PRN

## 2015-01-06 NOTE — ED Provider Notes (Signed)
CSN: [id number]   Arrival date & time 01/05/15 2305  History  By signing my name below, I, Altamease Oiler, attest that this documentation has been prepared under the direction and in the presence of Varney Biles, MD. Electronically Signed: Altamease Oiler, ED Scribe. 01/06/2015. 3:10 AM.  Chief Complaint  Patient presents with  . Loss of Consciousness    HPI The history is provided by the spouse and the patient. No language interpreter was used.   Brought in by EMS, Larry [name] is a 64 y.o. male with history of NICM s/p AICD placement, a-fib, CAD, MI, HTN, DM, CVA, and ESRD on M/W/F/S dialysis who presents to the Emergency Department complaining of a syncopal episode tonight while sitting. Pt states that he was more tired than usual after completing dialysis today but was otherwise in his usual state of health. Just prior to the syncopal episode he felt hot but not sweaty or dizzy. His wife witnessed the episode and states that his hands and torso were shaking for 10 minutes and he appeared to be staring off into space. After the shaking stopped the pt was initially unable to respond to his wife and was "making a noise". Pt also complains of abdominal pain. Pt denies bladder incontinence, biting his tongue, fever, chills, vomiting, diarrhea. Pt has no known history of seizures. He is no longer on Xarelto. Patient's wife states that he came home 6 days ago after being treated in a rehabilitation center. Reportedly his blood pressure is chronically low (near 80 systolic).   Past Medical History  Diagnosis Date  . NICM (nonischemic cardiomyopathy) ([place])     a. NICM. EF 2011 25-30%. b. LHC 2009 nonobstructive CAD. c. h/o [employer] Jude ICD 2012. d. Echo 04/2013 - EF 25-30% with mildly decreased RV systolic function. e. Echo 01/2014: EF 20-25%, inf akinesis, RHC. e. Home milrinone started 4/16.  [name] CAD (coronary artery disease)     a. nonobstructive CAD by cath 2009 & 06/2014.  . Obesity   .  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 Depression   . Erectile dysfunction   . 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 Polycythemia, secondary     improved/resolved  . Hypercholesterolemia   . Back pain     F/B [name]  . Monoclonal gammopathy of unknown significance     per [name]  . Neck pain     F/B [name]  . PAF (paroxysmal atrial fibrillation) ([place]) 06/04/10    a. On Xarelto. b. DCCV 02/2014, 04/2014 to NSR.  [name] History of diverticulitis of colon   . Type II or unspecified type diabetes mellitus with neurological manifestations, uncontrolled   . Nonproliferative diabetic retinopathy NOS(362.03)   . Ventricular tachycardia ([place]) 10/25/13    appropriate ICD shock, VT CL 230-240 msec  . Hypertension   . AICD (automatic cardioverter/defibrillator) present   . Peripheral vascular disease ([place])     2007  . Hypothyroidism   . PTSD (post-traumatic stress disorder)     a. Anxiety/PTSD from ICD shock  . Lupus anticoagulant positive     a. No h/o DVT. Saw Dr Lindi Adie with hematology.   . Pulmonary hypertension ([place])     a. Mixed pulmonary venous hypertension and PAH  . Chronic respiratory failure ([place])     a. As of 08/2014 requiring home O2 with ambulation  due to desat.  . Myocardial infarction [facility])     "showed evidence of one at one catheterization"  . COPD (chronic obstructive pulmonary disease) ([place])   . OSA (obstructive sleep apnea)     a. 4/16 sleep study with severe OSA; "suppose to get BiPAP 08/2014; was in hospital at the time (11/18/2014)  . Claustrophobia     "very very bad" (11/18/2014)  . History of blood transfusion 09/2014    "hematoma in back into right side; got 6 pints"  . Anemia   . CVA (cerebral vascular accident) ([place]) 2007    without residual deficit  . DDD (degenerative disc disease)     Chronic low back  pain.   . Arthritis     "mainly in back; whole back" (10/19/2014)  . Chronic kidney disease (CKD), stage IV (severe) ([place])   . ESRD (end stage renal disease) on dialysis [facility]) since 08/2014    "MWFS; [place]" (11/18/2014)  . Syncope 12/2014  . Presence of permanent cardiac pacemaker   . Skin cancer of arm     left   . Pressure ulcer of sacral region, stage 3 [facility])     Past Surgical History  Procedure Laterality Date  . Pituitary excision  ~ 2009    "gamma knife"  . Tonsillectomy    . Cardiac defibrillator placement  02/19/10    By JA.   . Right heart catheterization N/A 09/17/2013    Procedure: RIGHT HEART CATH;  Surgeon: [name], MD;  Location: [facility] CATH LAB;  Service: Cardiovascular;  Laterality: N/A;  . Insert / replace / remove pacemaker    . Cardioversion N/A 02/14/2014    Procedure: CARDIOVERSION;  Surgeon: [name], MD;  Location: [place];  Service: Cardiovascular;  Laterality: N/A;  . Cardioversion N/A 05/02/2014    Procedure: CARDIOVERSION;  Surgeon: [name], MD;  Location[facility] ENDOSCOPY;  Service: Cardiovascular;  Laterality: N/A;  . Right heart catheterization N/A 05/08/2014    Procedure: RIGHT HEART CATH;  Surgeon: [name], MD;  Location: [facility] CATH LAB;  Service: Cardiovascular;  Laterality: N/A;  . Multiple extractions with alveoloplasty N/A 08/22/2014    Procedure: Extraction of toothy #'s 3,5,6,7,9,10,11,12,13,14,20,21,22,23,27,28,29,30 with alveoloplasty and bilateral mandibular tori reductions;  Surgeon: Lenn Cal, DDS;  Location: [place];  Service: Oral Surgery;  Laterality: N/A;  . Cataract extraction w/ intraocular lens  implant, bilateral Bilateral 04/2014-05/2014  . Femur im nail Left 08/26/2014    Procedure: INTRAMEDULLARY (IM) NAIL FEMORAL;  Surgeon: [name], MD;  Location: [place];  Service: Orthopedics;  Laterality: Left;  . Av fistula placement Right 10/01/2014    Procedure: Creation of  Right Arm  ARTERIOVENOUS  Fistula;  Surgeon: [name], MD;  Location: [place];  Service: Vascular;  Laterality: Right;  . Fracture surgery    . Cardiac catheterization  2011?  [name] Cardiac catheterization N/A 06/26/2014    Procedure: Right/Left Heart Cath And Coronary Angiography;  Surgeon: [name], MD;  Location: [place] CV LAB;  Service: Cardiovascular;  Laterality: N/A;  . Cardiac catheterization N/A 08/21/2014    Procedure: Right Heart Cath;  Surgeon: [name], MD;  Location: [place] CV LAB;  Service: Cardiovascular;  Laterality: N/A;  . Coronary angioplasty    . Av fistula placement Right 8//2016    wrist  . Peripheral vascular catheterization Right 12/13/2014    Procedure: Fistulagram;  Surgeon: [name], MD;  Location: [place] CV LAB;  Service: Cardiovascular;  Laterality: Right;  . Av fistula placement Right 12/17/2014    Procedure: ARTERIOVENOUS (AV) FISTULA CREATION-right brachial-cephalic;  Surgeon: [name], MD;  Location: [place];  Service: Vascular;  Laterality: Right;  . Ligation of arteriovenous  fistula Right 12/17/2014    Procedure: LIGATION OF ARTERIOVENOUS  FISTULA-right radial-cephalic;  Surgeon: [name], MD;  Location: [place];  Service: Vascular;  Laterality: Right;  . Colonoscopy     ?    2013    Family History  Problem Relation Age of Onset  . Lung cancer    . Cancer Mother     skin  . Dementia Mother   . Depression Mother   . Heart disease Father 46  . Hypertension Father   . Lung cancer Father   . Obesity Father   . Obesity Sister   . Hypertension Sister   . Diabetes Brother   . Hypertension Brother   . Heart disease Brother 51    Died of stroke MI age 32  . Obesity Brother   . Lung cancer Paternal Uncle   . Diabetes Paternal Uncle     requiring leg amputations     Social History  Substance Use Topics  . Smoking status: Former Smoker    Types: Cigars    Quit date: 02/09/1983  . Smokeless tobacco: Never Used  . Alcohol Use:  No     Review of Systems  Constitutional: Positive for fatigue. Negative for diaphoresis.  Cardiovascular: Positive for syncope.  Gastrointestinal: Positive for abdominal pain.  Neurological: Positive for syncope. Negative for dizziness and weakness.  All other systems reviewed and are negative.  Home Medications   Prior to Admission medications   Medication Sig Start Date End Date Taking? Authorizing Provider  acetaminophen (TYLENOL) 325 MG tablet Take 650 mg by mouth every 4 (four) hours as needed for moderate pain or fever.   Yes Historical Provider, MD  ALPRAZolam Duanne Moron) 0.5 MG tablet Take 0.5 mg by mouth every 8 (eight) hours as needed for anxiety.   Yes Historical Provider, MD  amiodarone (PACERONE) 400 MG tablet Take 0.5 tablets (200 mg total) by mouth daily. 10/29/14  Yes [name], MD  atorvastatin (LIPITOR) 40 MG tablet TAKE 1 TABLET DAILY 03/18/14  Yes [name], MD  bisacodyl (DULCOLAX) 10 MG suppository Place 1 suppository (10 mg total) rectally daily as needed for moderate constipation. 10/11/14  Yes Amy D Clegg, NP  bisacodyl (DULCOLAX) 5 MG EC tablet Take 10 mg by mouth 2 (two) times daily.   Yes Historical Provider, MD  calcium acetate (PHOSLO) 667 MG capsule Take 667 mg by mouth 3 (three) times daily with meals.   Yes Historical Provider, MD  chlorhexidine (PERIDEX) 0.12 % solution Perform mouth rinses twice daily after breakfast and at bedtime. Patient taking differently: Use as directed in the mouth or throat 2 (two) times daily. Perform mouth rinses twice daily after breakfast and at bedtime (swish and spit) 08/24/14  Yes Dayna N Dunn, PA-C  DULoxetine (CYMBALTA) 30 MG capsule Take 60 mg by mouth 2 (two) times daily.    Yes Historical Provider, MD  insulin glargine (LANTUS) 100 unit/mL SOPN Inject 0.1 mLs (10 Units total) into the skin at bedtime. 11/22/14  Yes Velvet Bathe, MD  insulin lispro (HUMALOG KWIKPEN) 100 UNIT/ML KiwkPen Inject 5-18 Units into the  skin 3 (three) times daily as needed. Sliding scale: 121-150 = 5 units 151-200 = 7 units 201-250 = 9 units  251-300 = 11 units 301-350 = 13 units 351-400 = 15 units >400 = 18 units, call MD   Yes Historical Provider, MD  levothyroxine (SYNTHROID, LEVOTHROID) 75 MCG tablet TAKE 1 TABLET ON AN EMPTY STOMACH 30 MINUTES BEFORE BREAKFAST 07/13/14  Yes Historical Provider, MD  midodrine (PROAMATINE) 10 MG tablet Take 1 tablet (10 mg total) by mouth 3 (three) times daily. 10/11/14  Yes Amy D Clegg, NP  multivitamin (RENA-VIT) TABS tablet Take 1 tablet by mouth at bedtime. 10/11/14  Yes Amy D Clegg, NP  ondansetron (ZOFRAN) 4 MG tablet Take 1 tablet (4 mg total) by mouth every 8 (eight) hours as needed for nausea or vomiting. 10/11/14  Yes Amy D Clegg, NP  oxyCODONE-acetaminophen (PERCOCET) 10-325 MG tablet Take 1-2 tablets by mouth every 4 (four) hours as needed for pain. 12/17/14  Yes Ulyses Amor, PA-C  Pollen Extracts (PROSTAT PO) Take 30 mLs by mouth daily.   Yes Historical Provider, MD  polyethylene glycol (MIRALAX / GLYCOLAX) packet Take 17 g by mouth daily as needed for moderate constipation. 10/11/14  Yes Amy D Clegg, NP  sildenafil (REVATIO) 20 MG tablet Take 4 tablets (80 mg total) by mouth 3 (three) times daily. 08/24/14  Yes Dayna N Dunn, PA-C  traZODone (DESYREL) 50 MG tablet Take 50 mg by mouth at bedtime.   Yes Historical Provider, MD    Allergies  Bydureon; Losartan potassium; and Gabapentin  Triage Vitals: BP 74/51 mmHg  Pulse 90  Temp(Src) 97.6 F (36.4 C) (Oral)  Resp 16  SpO2 94%  Physical Exam  Constitutional: He is oriented to person, place, and time. He appears well-developed and well-nourished.  HENT:  Head: Normocephalic and atraumatic.  Eyes: EOM are normal.  Neck: Normal range of motion.  Distended neck veins  Cardiovascular: Normal rate, regular rhythm and intact distal pulses.   Faint heart sounds  Pulmonary/Chest: Effort normal. No respiratory distress.  Bibasilar  rales Right sided thoracic tunnel cath with no signs of infection surrounding it. No drainage noted.  Abdominal: Soft. He exhibits no distension. There is no tenderness.  Left -sided abdominal tenderness without rebound or guarding Pt has an umbilical hernia which is reducible  Musculoskeletal: Normal range of motion.  BLE pitting edema with some erythema. Ulcer at both shins with mild drainage with no signs of purulence. No signs of infection. Left posterior calcaneal region has an ulcer that measures approximately 3 cm with induration around it and yellow tissue within it which appears to be granular. No fouls smell appreciated.   Neurological: He is alert and oriented to person, place, and time.  Skin: Skin is warm and dry.  Skin over the BLE appears to be warm to touch.  Skin over the BUE distally appears to be cool.   Psychiatric: He has a normal mood and affect. Judgment normal.  Nursing note and vitals reviewed.   ED Course  Procedures   DIAGNOSTIC STUDIES: Oxygen Saturation is 94% on RA, adequate by my interpretation.    COORDINATION OF CARE: 12:33 AM Discussed treatment plan which includes lab work and EKG with pt at bedside and pt agreed to plan.  2:42 AM-Consult complete with Dr. Wynonia Lawman (Cardiology). Patient case explained and discussed.He understands that pt has advanced heart failure, a-fib, and an AICD and that he arrives with unprovoked  syncope, low blood pressure, and an abnormal appearing EKG. He recommends that pt will benefit from Hospitalist admission given his several comorbidities. He also recommends that the heart failure  team be consulted in morning for their input. He advises against the use of inotropic agents unless pt has AMS or his blood pressure is lower than 80.  Call ended at 2:45 AM.  3:03 AM-Consult complete with Dr. Loleta Books [facility]) who is in the department. Patient case explained and discussed. Agrees to admit patient for further evaluation and  treatment. Conversation ended at 3:07 AM.   Labs Reviewed  BASIC METABOLIC PANEL - Abnormal; Notable for the following:    Potassium 3.2 (*)    Chloride 97 (*)    Glucose, Bld 167 (*)    BUN 24 (*)    Creatinine, Ser 3.49 (*)    Calcium 8.4 (*)    GFR calc non Af Amer 17 (*)    GFR calc Af Amer 20 (*)    All other components within normal limits  CBC - Abnormal; Notable for the following:    RBC 3.26 (*)    Hemoglobin 10.7 (*)    HCT 34.4 (*)    MCV 105.5 (*)    RDW 19.4 (*)    Platelets 128 (*)    All other components within normal limits  TROPONIN I - Abnormal; Notable for the following:    Troponin I 0.07 (*)    All other components within normal limits  BASIC METABOLIC PANEL - Abnormal; Notable for the following:    Potassium 3.2 (*)    Chloride 98 (*)    Glucose, Bld 138 (*)    BUN 24 (*)    Creatinine, Ser 3.64 (*)    Calcium 8.5 (*)    GFR calc non Af Amer 16 (*)    GFR calc Af Amer 19 (*)    All other components within normal limits  CBC - Abnormal; Notable for the following:    RBC 3.27 (*)    Hemoglobin 10.7 (*)    HCT 34.4 (*)    MCV 105.2 (*)    RDW 19.6 (*)    Platelets 129 (*)    All other components within normal limits  GLUCOSE, CAPILLARY - Abnormal; Notable for the following:    Glucose-Capillary 127 (*)    All other components within normal limits  CBC WITH DIFFERENTIAL/PLATELET - Abnormal; Notable for the following:    RBC 3.46 (*)    Hemoglobin 11.0 (*)    HCT 36.6 (*)    MCV 105.8 (*)    RDW 19.3 (*)    All other components within normal limits  COMPREHENSIVE METABOLIC PANEL - Abnormal; Notable for the following:    Sodium 133 (*)    Chloride 96 (*)    Glucose, Bld 114 (*)    BUN 32 (*)    Creatinine, Ser 4.69 (*)    Albumin 2.9 (*)    GFR calc non Af Amer 12 (*)    GFR calc Af Amer 14 (*)    All other components within normal limits  GLUCOSE, CAPILLARY - Abnormal; Notable for the following:    Glucose-Capillary 102 (*)    All  other components within normal limits  GLUCOSE, CAPILLARY - Abnormal; Notable for the following:    Glucose-Capillary 100 (*)    All other components within normal limits  GLUCOSE, CAPILLARY - Abnormal; Notable for the following:    Glucose-Capillary 116 (*)    All other components within normal limits  CBG MONITORING, ED - Abnormal; Notable for the following:    Glucose-Capillary 151 (*)    All other components within normal  limits  I-STAT CG4 LACTIC ACID, ED - Abnormal; Notable for the following:    Lactic Acid, Venous 2.09 (*)    All other components within normal limits  CULTURE, BLOOD (ROUTINE X 2)  CULTURE, BLOOD (ROUTINE X 2)  MRSA PCR SCREENING  GLUCOSE, CAPILLARY  GLUCOSE, CAPILLARY  MAGNESIUM    Imaging Review No results found.  I personally reviewed and evaluated these images and lab results as a part of my medical decision-making.   EKG Interpretation  Date/Time:  Sunday January 05 2015 23:53:59 EST Ventricular Rate:  77 PR Interval:  312 QRS Duration: 172 QT Interval:  636 QTC Calculation: 720 R Axis:   -131 Text Interpretation:  Atrial fibrillation Nonspecific intraventricular conduction delay Consider inferior infarct Abnormal lateral Q waves Anterior infarct, old ED PHYSICIAN INTERPRETATION AVAILABLE IN [place] Confirmed by TEST, Record ([id number]) on 01/06/2015 7:14:22 AM    MDM   Final diagnoses:  Syncope and collapse  Seizure-like activity (HCC)  Hypotension, chronic  ESRD (end stage renal disease) on dialysis (HCC)  Acute on chronic systolic congestive heart failure (HCC)  Chronic atrial fibrillation (HCC)  Abnormal EKG    I personally performed the services described in this documentation, which was scribed in my presence. The recorded information has been reviewed and is accurate.   Pt comes in with cc of syncope. He has hx of advanced CHF with AICD placement. He also has ESRD and other medical comorbidities. Syncope appears to be  cardiogenic - he has risk factors and also there was no prodrome. Pt has low BP. He had his HD todahy. Currently, he is perfusing his brain well - aox 3. No signs of infection on exam and hx not suggestive of infection. Lactate ordered. Cards consulted.  Varney Biles, MD 01/21/15 [phone]

## 2015-01-06 NOTE — ED Notes (Signed)
RN attempt to call report to floor; RN to call back  

## 2015-01-06 NOTE — Progress Notes (Signed)
Patient has multiple wounds on Bilateral feet and a sacral wound. Foam dressing applied to sacrum. Patient had two foam dressings on the right foot and one foam dressing on the left foot that was applied in the ED. A gauze dressing is wrapped around the left heel wound. Wounds were assessed except for the foam dressing wounds on bilateral feet. Wound care consult has been placed by MD.

## 2015-01-06 NOTE — H&P (Signed)
History and Physical  Patient Name: Larry [name]     [id number]    DOB: Aug 31, 1950    DOA: 01/05/2015 Referring physician: Delana Meyer, MD PCP: [name], MD      Chief Complaint: Syncope  HPI: Larry [name] is a 64 y.o. 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  The patient had dialysis today, during which they took him only about 1 lb under his dry weight, and then was at home tonight watching television with his wife, when he started to get "hot", restless (she thought he was having a panic attack), and then "fell out", with his eyes rolled back slumped over in his chair. The patient's wife noted a few clonic jerks but no tonic movements, grunting.  He was unconscious long enough that she called 911 who told her to pull him on the floor in preparation for CPR, and around that time he started to wake up and was groggy.  There were no palpitations, chest pain, or dyspnea at the time of event.  No ICD discharge.  When EMS arrived, the patient's blood sugar was normal, and he was slightly hypotensive in the 70s over 50s. In the ED, the patient got gentle IV fluids and his blood pressure returned to 0000000 to [id number] systolic which is his normal.  His lactica     Review of Systems:  Pt complains of syncope, tired. Pt denies any chest pain, dyspnea, palpitations, tachycardia, ICD discharge.  Denies fever, chills, cough, sputum, dysuria, abdominal pain, flank pain.  All other systems negative except as just noted or noted in the history of present illness.  Allergies  Allergen Reactions  . Bydureon [Exenatide] Other (See Comments)    sweating  . Losartan Potassium Other (See Comments)    insomnia  . Gabapentin Other (See Comments)    confusion    Prior to Admission  medications   Medication Sig Start Date End Date Taking? Authorizing Provider  acetaminophen (TYLENOL) 325 MG tablet Take 650 mg by mouth every 4 (four) hours as needed for moderate pain or fever.   Yes Historical Provider, MD  ALPRAZolam Duanne Moron) 0.5 MG tablet Take 0.5 mg by mouth every 8 (eight) hours as needed for anxiety.   Yes Historical Provider, MD  amiodarone (PACERONE) 400 MG tablet Take 0.5 tablets (200 mg total) by mouth daily. 10/29/14  Yes [name], MD  atorvastatin (LIPITOR) 40 MG tablet TAKE 1 TABLET DAILY 03/18/14  Yes [name], MD  bisacodyl (DULCOLAX) 10 MG suppository Place 1 suppository (10 mg total) rectally daily as needed for moderate constipation. 10/11/14  Yes Amy D Clegg, NP  bisacodyl (DULCOLAX) 5 MG EC tablet Take 10 mg by mouth 2 (two) times daily.   Yes Historical Provider, MD  calcium acetate (PHOSLO) 667 MG capsule Take 667 mg by mouth 3 (three) times daily with meals.   Yes Historical Provider, MD  chlorhexidine (PERIDEX) 0.12 % solution Perform mouth rinses twice daily after breakfast and at bedtime. Patient taking differently: Use as directed in the mouth or throat 2 (two) times daily. Perform mouth rinses twice daily after breakfast and at bedtime (swish and spit) 08/24/14  Yes Dayna N Dunn, PA-C  DULoxetine (CYMBALTA) 30 MG capsule Take 60 mg by mouth 2 (two) times  daily.    Yes Historical Provider, MD  insulin glargine (LANTUS) 100 unit/mL SOPN Inject 0.1 mLs (10 Units total) into the skin at bedtime. 11/22/14  Yes Velvet Bathe, MD  insulin lispro (HUMALOG KWIKPEN) 100 UNIT/ML KiwkPen Inject 5-18 Units into the skin 3 (three) times daily as needed. Sliding scale: 121-150 = 5 units 151-200 = 7 units 201-250 = 9 units 251-300 = 11 units 301-350 = 13 units 351-400 = 15 units >400 = 18 units, call MD   Yes Historical Provider, MD  levothyroxine (SYNTHROID, LEVOTHROID) 75 MCG tablet TAKE 1 TABLET ON AN EMPTY STOMACH 30 MINUTES BEFORE BREAKFAST 07/13/14   Yes Historical Provider, MD  midodrine (PROAMATINE) 10 MG tablet Take 1 tablet (10 mg total) by mouth 3 (three) times daily. 10/11/14  Yes Amy D Clegg, NP  multivitamin (RENA-VIT) TABS tablet Take 1 tablet by mouth at bedtime. 10/11/14  Yes Amy D Clegg, NP  ondansetron (ZOFRAN) 4 MG tablet Take 1 tablet (4 mg total) by mouth every 8 (eight) hours as needed for nausea or vomiting. 10/11/14  Yes Amy D Clegg, NP  oxyCODONE-acetaminophen (PERCOCET) 10-325 MG tablet Take 1-2 tablets by mouth every 4 (four) hours as needed for pain. 12/17/14  Yes Ulyses Amor, PA-C  Pollen Extracts (PROSTAT PO) Take 30 mLs by mouth daily.   Yes Historical Provider, MD  polyethylene glycol (MIRALAX / GLYCOLAX) packet Take 17 g by mouth daily as needed for moderate constipation. 10/11/14  Yes Amy D Clegg, NP  sildenafil (REVATIO) 20 MG tablet Take 4 tablets (80 mg total) by mouth 3 (three) times daily. 08/24/14  Yes Dayna N Dunn, PA-C  traZODone (DESYREL) 50 MG tablet Take 50 mg by mouth at bedtime.   Yes Historical Provider, MD    Past Medical History  Diagnosis Date  . NICM (nonischemic cardiomyopathy) ([place])     a. NICM. EF 2011 25-30%. b. LHC 2009 nonobstructive CAD. c. h/o St. Jude ICD 2012. d. Echo 04/2013 - EF 25-30% with mildly decreased RV systolic function. e. Echo 01/2014: EF 20-25%, inf akinesis, RHC. e. Home milrinone started 4/16.  [name] CAD (coronary artery disease)     a. nonobstructive CAD by cath 2009 & 06/2014.  . Obesity   . Pituitary tumor ([place])      (nonfunctionging pituitary microadenoma) s/p gamma knife surgery at [facility] 2009 with neuropathy and retinopathy  . Depression   . Erectile dysfunction   . 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 Polycythemia, secondary     improved/resolved  . Hypercholesterolemia   . Back pain     F/B [name]  . Monoclonal gammopathy of unknown significance     per [name]  . Neck pain     F/B [name]  . PAF  (paroxysmal atrial fibrillation) ([place]) 06/04/10    a. On Xarelto. b. DCCV 02/2014, 04/2014 to NSR.  [name] History of diverticulitis of colon   . Type II or unspecified type diabetes mellitus with neurological manifestations, uncontrolled   . Nonproliferative diabetic retinopathy NOS(362.03)   . Ventricular tachycardia ([place]) 10/25/13    appropriate ICD shock, VT CL 230-240 msec  . Hypertension   . AICD (automatic cardioverter/defibrillator) present   . Peripheral vascular disease ([place])     2007  . Hypothyroidism   . PTSD (post-traumatic stress disorder)     a. Anxiety/PTSD from ICD shock  . Lupus anticoagulant positive     a. No h/o DVT. Saw  Dr Lindi Adie with hematology.   . Pulmonary hypertension ([place])     a. Mixed pulmonary venous hypertension and PAH  . Chronic respiratory failure ([place])     a. As of 08/2014 requiring home O2 with ambulation due to desat.  . Myocardial infarction [facility])     "showed evidence of one at one catheterization"  . COPD (chronic obstructive pulmonary disease) ([place])   . OSA (obstructive sleep apnea)     a. 4/16 sleep study with severe OSA; "suppose to get BiPAP 08/2014; was in hospital at the time (11/18/2014)  . Claustrophobia     "very very bad" (11/18/2014)  . History of blood transfusion 09/2014    "hematoma in back into right side; got 6 pints"  . Anemia   . CVA (cerebral vascular accident) ([place]) 2007    without residual deficit  . DDD (degenerative disc disease)     Chronic low back pain.   . Arthritis     "mainly in back; whole back" (10/19/2014)  . Chronic kidney disease (CKD), stage IV (severe) ([place])   . ESRD (end stage renal disease) on dialysis [facility]) since 08/2014    "MWFS; [place]" (11/18/2014)    Past Surgical History  Procedure Laterality Date  . Pituitary excision  ~ 2009    "gamma knife"  . Tonsillectomy    . Cardiac defibrillator placement  02/19/10    By JA.   . Right heart catheterization N/A 09/17/2013    Procedure: RIGHT HEART CATH;   Surgeon: [name], MD;  Location: [facility] CATH LAB;  Service: Cardiovascular;  Laterality: N/A;  . Insert / replace / remove pacemaker    . Cardioversion N/A 02/14/2014    Procedure: CARDIOVERSION;  Surgeon: [name], MD;  Location: [place];  Service: Cardiovascular;  Laterality: N/A;  . Cardioversion N/A 05/02/2014    Procedure: CARDIOVERSION;  Surgeon: [name], MD;  Location: [facility] ENDOSCOPY;  Service: Cardiovascular;  Laterality: N/A;  . Right heart catheterization N/A 05/08/2014    Procedure: RIGHT HEART CATH;  Surgeon: [name], MD;  Location: [facility] CATH LAB;  Service: Cardiovascular;  Laterality: N/A;  . Multiple extractions with alveoloplasty N/A 08/22/2014    Procedure: Extraction of toothy #'s 3,5,6,7,9,10,11,12,13,14,20,21,22,23,27,28,29,30 with alveoloplasty and bilateral mandibular tori reductions;  Surgeon: Lenn Cal, DDS;  Location: [place];  Service: Oral Surgery;  Laterality: N/A;  . Cataract extraction w/ intraocular lens  implant, bilateral Bilateral 04/2014-05/2014  . Femur im nail Left 08/26/2014    Procedure: INTRAMEDULLARY (IM) NAIL FEMORAL;  Surgeon: [name], MD;  Location: [place];  Service: Orthopedics;  Laterality: Left;  . Av fistula placement Right 10/01/2014    Procedure: Creation of  Right Arm  ARTERIOVENOUS Fistula;  Surgeon: [name], MD;  Location: [place];  Service: Vascular;  Laterality: Right;  . Fracture surgery    . Cardiac catheterization  2011?  [name] Cardiac catheterization N/A 06/26/2014    Procedure: Right/Left Heart Cath And Coronary Angiography;  Surgeon: [name], MD;  Location: [place] CV LAB;  Service: Cardiovascular;  Laterality: N/A;  . Cardiac catheterization N/A 08/21/2014    Procedure: Right Heart Cath;  Surgeon: [name], MD;  Location: [place] CV LAB;  Service: Cardiovascular;  Laterality: N/A;  . Coronary angioplasty    . Av fistula placement Right 8//2016    wrist  . Peripheral  vascular catheterization Right 12/13/2014    Procedure: Fistulagram;  Surgeon: [name], MD;  Location: [place] CV LAB;  Service: Cardiovascular;  Laterality: Right;  . Av fistula placement Right 12/17/2014    Procedure: ARTERIOVENOUS (AV) FISTULA CREATION-right brachial-cephalic;  Surgeon: [name], MD;  Location: [facility] OR;  Service: Vascular;  Laterality: Right;  . Ligation of arteriovenous  fistula Right 12/17/2014    Procedure: LIGATION OF ARTERIOVENOUS  FISTULA-right radial-cephalic;  Surgeon: [name], MD;  Location: [facility] OR;  Service: Vascular;  Laterality: Right;    Family history: family history includes Cancer in his mother; Dementia in his mother; Depression in his mother; Diabetes in his brother and paternal uncle; Heart disease (age of onset: 7) in his brother; Heart disease (age of onset: 6) in his father; Hypertension in his brother, father, and sister; Lung cancer in his father, paternal uncle, and another family member; Obesity in his brother, father, and sister.  Social History: Patient lives with his wife.  He was in the hospital from July to September, and then subsequently in rehab until this past Monday.  He si a remote former smoker.  He used to be a Biochemist, clinical.       Physical Exam: BP 100/70 mmHg  Pulse 76  Temp(Src) 97.6 F (36.4 C) (Oral)  Resp 15  SpO2 100% General appearance: Chronically ill appearing adult male, sleepy and tired but in no acute distress.   Eyes: Anicteric, conjunctiva pink, lids and lashes normal.     ENT: No nasal discharge.   Skin: Pallor.  Warm and dry.  There is a sacral decub, small, without surrounding induration or discharge or erythema and only minimal pain.  The legs are diffusely red, and there is a left heel ulcer that is painful, as well as several other covered ulcers, none of which are draining or surrounded by swelling/inflammation. Cardiac: RRR, nl S1-S2, S3 present, no other murmurs appreciated.  JVP normal.   Moderate LE edema. Respiratory: Normal respiratory rate and rhythm.  CTAB without rales or wheezes. Abdomen: Abdomen soft without rigidity.  Soft and without TTP. No ascites, distension.   MSK: No deformities or effusions. Neuro: sleepy, but otherwise sensorium intact and responding to questions, attention normal.  Speech is fluent. Globally weak, but moves all extremities equally and with normal coordination.    Psych: Behavior appropriate.  Affect blunted.  No evidence of aural or visual hallucinations or delusions.       Labs on Admission:  The metabolic panel shows hypokalemia, elevated creatinine. The troponin is minimally elevated at 0.07 ng per mL. The lactic acid level is 2.09 mmol per liter. The complete blood count shows normal WBC, mild chronic anemia, improved from previous, mild thrombocytopenia.   Radiological Exams on Admission: Personally reviewed: Dg Chest Port 1 View  01/06/2015  CLINICAL DATA:  Syncope. EXAM: PORTABLE CHEST 1 VIEW COMPARISON:  11/20/2014 FINDINGS: Dual-lumen right jugular central line extends to the cavoatrial junction. There is airspace opacity in the central and basilar right lung. There is a moderately large right pleural effusion. These findings are worsened from 11/20/2014. The left lung is clear. There is unchanged moderate cardiomegaly. IMPRESSION: Central and basilar airspace opacity on the right. Pleural effusion on the right. This could represent asymmetric alveolar edema and effusion. Infectious infiltrate cannot be excluded. Electronically Signed   By: Andreas Newport M.D.   On: 01/06/2015 01:37    EKG: Independently reviewed. Similar to previous. Nonspecific IVCD. I do not appreciate P waves but it seems regular. The QRS duration is minimally longer than previous.  Assessment/Plan 1. Syncope:  This is new.  Within the differential is vasovagal syncope, hypovolemia from aggressive HD, syncope from sepsis. There was no ICD discharge.  Wasn't exertional to suggest pHTN. -Admit to stepdown for monitoring given hypotension and advanced heart disease -Gentle fluids -Follow blood cultures -Consult to cardiology, appreciate recommendations -Continue home midodrine   2. Chronic systolic CHF:  -Continue amiodarone  3. ESRD:  -Continue maintenance HD, do tomorrow -Consult to nephrology, appreciate recommendations  4. AFib:  CHADs2Vasc 1-2.  Not on warfarin due to hemothorax and retroperitoneal hematoma. Continue amiodarone.  5. Hypothroiyidm:  Stable.  Continue levothyroxine  6. Pulm HTN:  Continue sildenafil.    DVT PPx: SCDs Diet: Cardiac Consultants: Cardiology, Nephrology for HD Code Status: Full Family Communication: Wife, present at bedside  Medical decision making: What exists of the patient's previous chart was reviewed in depth and the case was discussed with Dr. Kathrynn Humble. Patient seen 3:47 AM on 01/06/2015.  Disposition Plan:  Admit for observation.  Consultation with cardiology.  If telemetry normal, and BP improves with fluids and lactic acid resolves.  Potentially home, pending Cardiology recommendations.      Edwin Dada [facility] Pager [phone]

## 2015-01-06 NOTE — Progress Notes (Signed)
Attempted to receive report. ED nurse asked if they can call back.

## 2015-01-06 NOTE — Consult Note (Addendum)
WOC wound consult note Reason for Consult: Consult requested for buttocks and bilat leg wounds. Pt has been using Santyl to several wounds prior to admission and has assistance from home health nurses. Wound type: Sacrum unstageable pressure injury; .8X.3cm, 80% yellow slough, 20% red.  Small amt yellow drainage, no odor. Surrounded by darker-colored reddish-purple deep tissue areas, approx cm around wound to bilat buttocks and sacrum areas. Left heel stage 3 pressure injury; 1X2X.3cm, 80% slough, 20% red, small amt yellow drainage, no odor, surrounded by dry dark red callous. Left lower leg with partial thickness stasis ulcer; .2X.2X.2cm, yellow and moist, no odor, small amt yellow drainage. Right lower leg with partial thickness stasis ulcer; .2X.2X.2cm, yellow and moist, no odor, small amt yellow drainage. Right lower calf with full thickness stasis ulcer; 1.5X1.2X.3cm, 90% yellow, 10% red, mod amt yellow drainage, no odor. Pressure Ulcer POA: Yes Dressing procedure/placement/frequency: Float heels to reduce pressure.  Santyl ointment for chemical debridement of nonviable tissue to sacrum, left heel, and right lower leg.  Foam dressing to partial thickness wounds.  Wife at bedside is well-informed regarding topical treatment. Please re-consult if further assistance is needed.  Thank-you,  Julien Girt MSN, [place], [place], [place], [place]

## 2015-01-06 NOTE — Evaluation (Signed)
Physical Therapy Evaluation Patient Details Name: Larry [name] MRN: [id number] DOB: 1950-06-03 Today's Date: 01/06/2015   History of Present Illness  Larry [name] is a 64 y.o. 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  Clinical Impression  Pt admitted with above diagnosis. Pt currently with functional limitations due to the deficits listed below (see PT Problem List). Pt able to ambulate with good safety with RW.  Should progress well and be able to go home with [facility] f/u and wife and caregiver assist.  Will follow acutely.   Pt will benefit from skilled PT to increase their independence and safety with mobility to allow discharge to the venue listed below.      Follow Up Recommendations Home health PT;Supervision/Assistance - 24 hour (HHRN, HHOT and HHaide)    Equipment Recommendations  None recommended by PT    Recommendations for Other Services       Precautions / Restrictions Precautions Precautions: Fall Restrictions Weight Bearing Restrictions: No      Mobility  Bed Mobility               General bed mobility comments: on EOB on arrival.   Transfers Overall transfer level: Needs assistance Equipment used: Rolling walker (2 wheeled) Transfers: Sit to/from Stand Sit to Stand: Min assist         General transfer comment: Assist to power up and cues for hand placement  Ambulation/Gait Ambulation/Gait assistance: Min guard Ambulation Distance (Feet): 15 Feet Assistive device: Rolling walker (2 wheeled) Gait Pattern/deviations: Step-through pattern;Decreased stride length;Trunk flexed   Gait velocity interpretation: Below normal speed for age/gender General Gait Details: Pt with limited distance due to going for CT and they came to get him as we walked.   Pt with good safety with RW.   Stairs            Wheelchair Mobility    Modified Rankin (Stroke Patients Only)       Balance Overall balance assessment: Needs assistance Sitting-balance support: No upper extremity supported;Feet supported Sitting balance-Leahy Scale: Fair     Standing balance support: Bilateral upper extremity supported;During functional activity Standing balance-Leahy Scale: Poor Standing balance comment: relies on RW for balance.                              Pertinent Vitals/Pain Pain Assessment: No/denies pain  VSS    Home Living Family/patient expects to be discharged to:: Private residence Living Arrangements: Spouse/significant other Available Help at Discharge: Family;Available 24 hours/day (wife works, hired caregivers certain days) Type of Home: House Home Access: Ramped entrance Entrance Stairs-Rails: None   Home Layout: Two level;Bed/bath upstairs;1/2 bath on [place]: Bedside commode;Walker - 2 wheels;Hospital bed;Wheelchair - manual Additional Comments: Pt staying on main level.    Prior Function Level of Independence: Needs assistance   Gait / Transfers Assistance Needed: could walk some with RW in home by himself  ADL's / Homemaking Assistance Needed: sponge bathing  Comments: Just came home from Rehab Mon, Nov 21.  Wife and caregivers close to 24 hours day.  Had home helpers full days Tues and Thurs. M,W, F Dialysis     Hand Dominance   Dominant Hand: Right    Extremity/Trunk Assessment  Upper Extremity Assessment: Defer to OT evaluation           Lower Extremity Assessment: Generalized weakness      Cervical / Trunk Assessment: Kyphotic  Communication   Communication: No difficulties  Cognition Arousal/Alertness: Awake/alert Behavior During Therapy: WFL for tasks assessed/performed Overall Cognitive Status: Within Functional Limits for tasks assessed                       General Comments General comments (skin integrity, edema, etc.): Multiple ulcers on heels, left worse than right per pt and wife as well as on buttocks.     Exercises        Assessment/Plan    PT Assessment Patient needs continued PT services  PT Diagnosis Generalized weakness   PT Problem List Decreased balance;Decreased mobility;Decreased activity tolerance;Decreased knowledge of use of DME;Decreased safety awareness;Decreased knowledge of precautions  PT Treatment Interventions DME instruction;Gait training;Functional mobility training;Therapeutic activities;Therapeutic exercise;Balance training;Patient/family education   PT Goals (Current goals can be found in the Care Plan section) Acute Rehab PT Goals Patient Stated Goal: to get better PT Goal Formulation: With patient Time For Goal Achievement: 01/20/15 Potential to Achieve Goals: Good    Frequency Min 3X/week   Barriers to discharge        Co-evaluation               End of Session Equipment Utilized During Treatment: Gait belt Activity Tolerance: Patient limited by fatigue Patient left: in chair;with call bell/phone within reach;Other (comment);with family/visitor present (with CT tech) Nurse Communication: Mobility status    Functional Assessment Tool Used: clinical judgment Functional Limitation: Mobility: Walking and moving around Mobility: Walking and Moving Around Current Status [id number]): At least 1 percent but less than 20 percent impaired, limited or restricted Mobility: Walking and Moving Around Goal Status [phone]): At least 1 percent but less than 20 percent impaired, limited or restricted    Time: 1156-1223 PT Time Calculation (min) (ACUTE ONLY): 27 min   Charges:   PT Evaluation $Initial PT Evaluation Tier I: 1 Procedure PT Treatments $Gait Training: 8-22 mins   PT G Codes:   PT G-Codes **NOT FOR INPATIENT CLASS** Functional Assessment Tool Used: clinical judgment Functional Limitation:  Mobility: Walking and moving around Mobility: Walking and Moving Around Current Status [id number]): At least 1 percent but less than 20 percent impaired, limited or restricted Mobility: Walking and Moving Around Goal Status [phone]): At least 1 percent but less than 20 percent impaired, limited or restricted    Denice Paradise 01/06/2015, 3:52 PM [place] Brailyn Delman,PT Acute Rehabilitation [phone] (pager)

## 2015-01-06 NOTE — Progress Notes (Addendum)
[place] OF CARE NOTE  Patient: Larry [name]   [id number]  PCP: [name], MD  DOB: [birth year]  DOA: 01/05/2015    DOS: 01/06/2015   Plan of care: Patient was seen and admitted by Dr. Loleta Books earlier in the morning of 01/06/2015. I reviewed the H&P and agree with the assessment and plan. Blood pressure continues to remain 90s at present. The patient does not complaining of any acute abnormality. Examination reveals anisocoria.  Check CT of the head. Cardiology to follow. Continue in stepdown at present  Consult wound care for pressure ulcers  Replace potassium  Author: Berle Mull, MD [facility] Pager: [phone] 01/06/2015 11:16 AM   If 7PM-7AM, please contact night-coverage at [email or web address], password [facility]

## 2015-01-06 NOTE — ED Notes (Signed)
MD at bedside. 

## 2015-01-07 ENCOUNTER — Ambulatory Visit: Payer: Self-pay | Admitting: Surgery

## 2015-01-07 DIAGNOSIS — Z95810 Presence of automatic (implantable) cardiac defibrillator: Secondary | ICD-10-CM | POA: Diagnosis not present

## 2015-01-07 DIAGNOSIS — E118 Type 2 diabetes mellitus with unspecified complications: Secondary | ICD-10-CM | POA: Diagnosis not present

## 2015-01-07 DIAGNOSIS — I5022 Chronic systolic (congestive) heart failure: Secondary | ICD-10-CM

## 2015-01-07 DIAGNOSIS — R55 Syncope and collapse: Principal | ICD-10-CM

## 2015-01-07 DIAGNOSIS — Z992 Dependence on renal dialysis: Secondary | ICD-10-CM

## 2015-01-07 DIAGNOSIS — L8990 Pressure ulcer of unspecified site, unspecified stage: Secondary | ICD-10-CM

## 2015-01-07 DIAGNOSIS — I10 Essential (primary) hypertension: Secondary | ICD-10-CM

## 2015-01-07 DIAGNOSIS — N186 End stage renal disease: Secondary | ICD-10-CM

## 2015-01-07 DIAGNOSIS — I481 Persistent atrial fibrillation: Secondary | ICD-10-CM

## 2015-01-07 LAB — CBC WITH DIFFERENTIAL/PLATELET
BASOS ABS: 0.1 10*3/uL (ref 0.0–0.1)
Basophils Relative: 2 %
EOS ABS: 0.1 10*3/uL (ref 0.0–0.7)
Eosinophils Relative: 2 %
HCT: 36.6 % — ABNORMAL LOW (ref 39.0–52.0)
HEMOGLOBIN: 11 g/dL — AB (ref 13.0–17.0)
LYMPHS ABS: 1.3 10*3/uL (ref 0.7–4.0)
Lymphocytes Relative: 21 %
MCH: 31.8 pg (ref 26.0–34.0)
MCHC: 30.1 g/dL (ref 30.0–36.0)
MCV: 105.8 fL — ABNORMAL HIGH (ref 78.0–100.0)
Monocytes Absolute: 0.6 10*3/uL (ref 0.1–1.0)
Monocytes Relative: 10 %
NEUTROS PCT: 65 %
Neutro Abs: 4 10*3/uL (ref 1.7–7.7)
PLATELETS: 156 10*3/uL (ref 150–400)
RBC: 3.46 MIL/uL — AB (ref 4.22–5.81)
RDW: 19.3 % — ABNORMAL HIGH (ref 11.5–15.5)
WBC: 6.2 10*3/uL (ref 4.0–10.5)

## 2015-01-07 LAB — COMPREHENSIVE METABOLIC PANEL
ALBUMIN: 2.9 g/dL — AB (ref 3.5–5.0)
ALK PHOS: 75 U/L (ref 38–126)
ALT: 22 U/L (ref 17–63)
AST: 29 U/L (ref 15–41)
Anion gap: 11 (ref 5–15)
BUN: 32 mg/dL — AB (ref 6–20)
CALCIUM: 9 mg/dL (ref 8.9–10.3)
CHLORIDE: 96 mmol/L — AB (ref 101–111)
CO2: 26 mmol/L (ref 22–32)
CREATININE: 4.69 mg/dL — AB (ref 0.61–1.24)
GFR calc non Af Amer: 12 mL/min — ABNORMAL LOW (ref >60)
GFR, EST AFRICAN AMERICAN: 14 mL/min — AB (ref >60)
GLUCOSE: 114 mg/dL — AB (ref 65–99)
Potassium: 4.3 mmol/L (ref 3.5–5.1)
SODIUM: 133 mmol/L — AB (ref 135–145)
Total Bilirubin: 0.7 mg/dL (ref 0.3–1.2)
Total Protein: 6.9 g/dL (ref 6.5–8.1)

## 2015-01-07 LAB — GLUCOSE, CAPILLARY
Glucose-Capillary: 100 mg/dL — ABNORMAL HIGH (ref 65–99)
Glucose-Capillary: 116 mg/dL — ABNORMAL HIGH (ref 65–99)

## 2015-01-07 LAB — MAGNESIUM: Magnesium: 1.9 mg/dL (ref 1.7–2.4)

## 2015-01-07 NOTE — Consult Note (Addendum)
Advanced Heart Failure Team Consult Note  Referring Physician: Dr. Marlowe Sax Primary Physician:  Primary Cardiologist:  Dr. Aundra Dubin  Reason for Consultation: Systolic CHF/ Synope  HPI:    Larry [name] is a 64 y.o. male with history of chronic systolic CHF (nonischemic cardiomyopathy), CKD requiring dialysis, prior CVA, and paroxysmal atrial fibrillation. Patient says that he has been known to have a low EF for years. He has a Research officer, political party ICD. He had very long hospitalization in 7-09/2014 during which he was transitioned to HD after needing CVVHD for a prolong period. He has been in [place] home. He had gone back home for a short time, but was readmitted in 10/16 with falls and acute encephalopathy and returned to [facility]. Encephalopathy thought to be 2/2 gabapentin.  He was last seen in HF clinic 12/09/14 and thought to be stable with improved tolerance of HD. He remained mildly volume overloaded despite 4 times a week HD.   Admitted with syncopal episode after dialysis 01/05/15. He remained unconscious long enough for her to call 911 who suggested pulling him to the floor to prepare for CPR when he awakened, though groggy. EMS found him alert with normal BG and hypotensive with SBPs in 70s.  Gentle IVF in ED improved SBP to 90-100s.   Pertinent admission labs include K 3.2, Cr 3.49 (dialysis), and troponin 0.07. CXR with central and basilar airspace opacity on the right. Pleural effusion on the right. This could represent asymmetric alveolar edema and effusion. Infectious infiltrate cannot be excluded.  He has been at home for ~2 weeks now from [place] home. Had been feeling fine prior to syncopal episode.  Denies fever, lightheadedness, dizziness, or near-syncope prior to the event.  He was seated in recliner preparing for bed and was difficult to rouse per wife despite "shaking him, slapping his face, and yelling". He finally stirred when she pulled him to the ground, hitting his head  in the process. As above, SBPs in 70 on EMS arrival. He states he feels like he fell asleep, and next thing he remembers is FD and EMS standing around him. Dry weight 202 after dialysis, still doing 4 x a week.  Was supposed to get Monday.  Has not had HD since admit.  Denies SOB or CP currently.    Review of Systems: [y] = yes, [ ]  = no   General: Weight gain [y]; Weight loss [ ] ; Anorexia [ ] ; Fatigue [ ] ; Fever [ ] ; Chills [ ] ; Weakness [y]  Cardiac: Chest pain/pressure [ ] ; Resting SOB [ ] ; Exertional SOB [ ] ; Orthopnea [y]; Pedal Edema [y]; Palpitations [ ] ; Syncope [y]; Presyncope [ ] ; Paroxysmal nocturnal dyspnea[ ]   Pulmonary: Cough [ ] ; Wheezing[ ] ; Hemoptysis[ ] ; Sputum [ ] ; Snoring [ ]   GI: Vomiting[ ] ; Dysphagia[ ] ; Melena[ ] ; Hematochezia [ ] ; Heartburn[ ] ; Abdominal pain [ ] ; Constipation [ ] ; Diarrhea [ ] ; BRBPR [ ]   GU: Hematuria[ ] ; Dysuria [ ] ; Nocturia[ ]   Vascular: Pain in legs with walking [ ] ; Pain in feet with lying flat [ ] ; Non-healing sores [ ] ; Stroke [ ] ; TIA [ ] ; Slurred speech [ ] ;  Neuro: Headaches[ ] ; Vertigo[ ] ; Seizures[ ] ; Paresthesias[ ] ;Blurred vision [ ] ; Diplopia [ ] ; Vision changes [ ]   Ortho/Skin: Arthritis [ ] ; Joint pain [ ] ; Muscle pain [ ] ; Joint swelling [ ] ; Back Pain [ ] ; Rash [ ]   Psych: Depression[ ] ; Anxiety[ ]   Heme: Bleeding problems [ ] ;  Clotting disorders [ ] ; Anemia [ ]   Endocrine: Diabetes [ ] ; Thyroid dysfunction[ ]   Home Medications Prior to Admission medications   Medication Sig Start Date End Date Taking? Authorizing Provider  acetaminophen (TYLENOL) 325 MG tablet Take 650 mg by mouth every 4 (four) hours as needed for moderate pain or fever.   Yes Historical Provider, MD  ALPRAZolam Duanne Moron) 0.5 MG tablet Take 0.5 mg by mouth every 8 (eight) hours as needed for anxiety.   Yes Historical Provider, MD  amiodarone (PACERONE) 400 MG tablet Take 0.5 tablets (200 mg total) by mouth daily. 10/29/14  Yes [name], MD  atorvastatin  (LIPITOR) 40 MG tablet TAKE 1 TABLET DAILY 03/18/14  Yes [name], MD  bisacodyl (DULCOLAX) 10 MG suppository Place 1 suppository (10 mg total) rectally daily as needed for moderate constipation. 10/11/14  Yes Amy D Clegg, NP  bisacodyl (DULCOLAX) 5 MG EC tablet Take 10 mg by mouth 2 (two) times daily.   Yes Historical Provider, MD  calcium acetate (PHOSLO) 667 MG capsule Take 667 mg by mouth 3 (three) times daily with meals.   Yes Historical Provider, MD  chlorhexidine (PERIDEX) 0.12 % solution Perform mouth rinses twice daily after breakfast and at bedtime. Patient taking differently: Use as directed in the mouth or throat 2 (two) times daily. Perform mouth rinses twice daily after breakfast and at bedtime (swish and spit) 08/24/14  Yes Dayna N Dunn, PA-C  DULoxetine (CYMBALTA) 30 MG capsule Take 60 mg by mouth 2 (two) times daily.    Yes Historical Provider, MD  insulin glargine (LANTUS) 100 unit/mL SOPN Inject 0.1 mLs (10 Units total) into the skin at bedtime. 11/22/14  Yes Velvet Bathe, MD  insulin lispro (HUMALOG KWIKPEN) 100 UNIT/ML KiwkPen Inject 5-18 Units into the skin 3 (three) times daily as needed. Sliding scale: 121-150 = 5 units 151-200 = 7 units 201-250 = 9 units 251-300 = 11 units 301-350 = 13 units 351-400 = 15 units >400 = 18 units, call MD   Yes Historical Provider, MD  levothyroxine (SYNTHROID, LEVOTHROID) 75 MCG tablet TAKE 1 TABLET ON AN EMPTY STOMACH 30 MINUTES BEFORE BREAKFAST 07/13/14  Yes Historical Provider, MD  midodrine (PROAMATINE) 10 MG tablet Take 1 tablet (10 mg total) by mouth 3 (three) times daily. 10/11/14  Yes Amy D Clegg, NP  multivitamin (RENA-VIT) TABS tablet Take 1 tablet by mouth at bedtime. 10/11/14  Yes Amy D Clegg, NP  ondansetron (ZOFRAN) 4 MG tablet Take 1 tablet (4 mg total) by mouth every 8 (eight) hours as needed for nausea or vomiting. 10/11/14  Yes Amy D Clegg, NP  oxyCODONE-acetaminophen (PERCOCET) 10-325 MG tablet Take 1-2 tablets by mouth every  4 (four) hours as needed for pain. 12/17/14  Yes Ulyses Amor, PA-C  Pollen Extracts (PROSTAT PO) Take 30 mLs by mouth daily.   Yes Historical Provider, MD  polyethylene glycol (MIRALAX / GLYCOLAX) packet Take 17 g by mouth daily as needed for moderate constipation. 10/11/14  Yes Amy D Clegg, NP  sildenafil (REVATIO) 20 MG tablet Take 4 tablets (80 mg total) by mouth 3 (three) times daily. 08/24/14  Yes Dayna N Dunn, PA-C  traZODone (DESYREL) 50 MG tablet Take 50 mg by mouth at bedtime.   Yes Historical Provider, MD    Past Medical History: Past Medical History  Diagnosis Date  . NICM (nonischemic cardiomyopathy) ([place])     a. NICM. EF 2011 25-30%. b. LHC 2009 nonobstructive CAD. c. h/o St.  Jude ICD 2012. d. Echo 04/2013 - EF 25-30% with mildly decreased RV systolic function. e. Echo 01/2014: EF 20-25%, inf akinesis, RHC. e. Home milrinone started 4/16.  [name] CAD (coronary artery disease)     a. nonobstructive CAD by cath 2009 & 06/2014.  . Obesity   . 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 2009 with neuropathy and retinopathy  . Depression   . Erectile dysfunction   . 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 Polycythemia, secondary     improved/resolved  . Hypercholesterolemia   . Back pain     F/B [name]  . Monoclonal gammopathy of unknown significance     per [name]  . Neck pain     F/B [name]  . PAF (paroxysmal atrial fibrillation) ([place]) 06/04/10    a. On Xarelto. b. DCCV 02/2014, 04/2014 to NSR.  [name] History of diverticulitis of colon   . Type II or unspecified type diabetes mellitus with neurological manifestations, uncontrolled   . Nonproliferative diabetic retinopathy NOS(362.03)   . Ventricular tachycardia ([place]) 10/25/13    appropriate ICD shock, VT CL 230-240 msec  . Hypertension   . AICD (automatic cardioverter/defibrillator) present   . Peripheral vascular  disease ([place])     2007  . Hypothyroidism   . PTSD (post-traumatic stress disorder)     a. Anxiety/PTSD from ICD shock  . Lupus anticoagulant positive     a. No h/o DVT. Saw Dr Lindi Adie with hematology.   . Pulmonary hypertension ([place])     a. Mixed pulmonary venous hypertension and PAH  . Chronic respiratory failure ([place])     a. As of 08/2014 requiring home O2 with ambulation due to desat.  . Myocardial infarction [facility])     "showed evidence of one at one catheterization"  . COPD (chronic obstructive pulmonary disease) ([place])   . OSA (obstructive sleep apnea)     a. 4/16 sleep study with severe OSA; "suppose to get BiPAP 08/2014; was in hospital at the time (11/18/2014)  . Claustrophobia     "very very bad" (11/18/2014)  . History of blood transfusion 09/2014    "hematoma in back into right side; got 6 pints"  . Anemia   . CVA (cerebral vascular accident) ([place]) 2007    without residual deficit  . DDD (degenerative disc disease)     Chronic low back pain.   . Arthritis     "mainly in back; whole back" (10/19/2014)  . Chronic kidney disease (CKD), stage IV (severe) ([place])   . ESRD (end stage renal disease) on dialysis [facility]) since 08/2014    "MWFS; [place]" (11/18/2014)  . Syncope 12/2014  . Presence of permanent cardiac pacemaker   . Skin cancer of arm     left   . Pressure ulcer of sacral region, stage 3 [facility])     Past Surgical History: Past Surgical History  Procedure Laterality Date  . Pituitary excision  ~ 2009    "gamma knife"  . Tonsillectomy    . Cardiac defibrillator placement  02/19/10    By JA.   . Right heart catheterization N/A 09/17/2013    Procedure: RIGHT HEART CATH;  Surgeon: [name], MD;  [facility] C[facility] LAB;  Service: Cardiovascular;  Laterality: N/A;  . Insert / replace / remove pacemaker    . Cardioversion N/A 02/14/2014    Procedure: CARDIOVERSION;  Surgeon: Kirk Ruths  Claris Gladden, MD;  Location: [place];  Service: Cardiovascular;  Laterality: N/A;   . Cardioversion N/A 05/02/2014    Procedure: CARDIOVERSION;  Surgeon: [name], MD;  Location: [facility] ENDOSCOPY;  Service: Cardiovascular;  Laterality: N/A;  . Right heart catheterization N/A 05/08/2014    Procedure: RIGHT HEART CATH;  Surgeon: [name], MD;  Location: [facility] CATH LAB;  Service: Cardiovascular;  Laterality: N/A;  . Multiple extractions with alveoloplasty N/A 08/22/2014    Procedure: Extraction of toothy #'s 3,5,6,7,9,10,11,12,13,14,20,21,22,23,27,28,29,30 with alveoloplasty and bilateral mandibular tori reductions;  Surgeon: Lenn Cal, DDS;  Location: [place];  Service: Oral Surgery;  Laterality: N/A;  . Cataract extraction w/ intraocular lens  implant, bilateral Bilateral 04/2014-05/2014  . Femur im nail Left 08/26/2014    Procedure: INTRAMEDULLARY (IM) NAIL FEMORAL;  Surgeon: [name], MD;  Location: [place];  Service: Orthopedics;  Laterality: Left;  . Av fistula placement Right 10/01/2014    Procedure: Creation of  Right Arm  ARTERIOVENOUS Fistula;  Surgeon: [name], MD;  Location: [place];  Service: Vascular;  Laterality: Right;  . Fracture surgery    . Cardiac catheterization  2011?  [name] Cardiac catheterization N/A 06/26/2014    Procedure: Right/Left Heart Cath And Coronary Angiography;  Surgeon: [name], MD;  Location: [place] CV LAB;  Service: Cardiovascular;  Laterality: N/A;  . Cardiac catheterization N/A 08/21/2014    Procedure: Right Heart Cath;  Surgeon: [name], MD;  Location: [place] CV LAB;  Service: Cardiovascular;  Laterality: N/A;  . Coronary angioplasty    . Av fistula placement Right 8//2016    wrist  . Peripheral vascular catheterization Right 12/13/2014    Procedure: Fistulagram;  Surgeon: [name], MD;  Location: [place] CV LAB;  Service: Cardiovascular;  Laterality: Right;  . Av fistula placement Right 12/17/2014    Procedure: ARTERIOVENOUS (AV) FISTULA CREATION-right brachial-cephalic;  Surgeon:  [name], MD;  Location: [place];  Service: Vascular;  Laterality: Right;  . Ligation of arteriovenous  fistula Right 12/17/2014    Procedure: LIGATION OF ARTERIOVENOUS  FISTULA-right radial-cephalic;  Surgeon: [name], MD;  Location: [place];  Service: Vascular;  Laterality: Right;  . Colonoscopy     ?    2013    Family History: Family History  Problem Relation Age of Onset  . Lung cancer    . Cancer Mother     skin  . Dementia Mother   . Depression Mother   . Heart disease Father 46  . Hypertension Father   . Lung cancer Father   . Obesity Father   . Obesity Sister   . Hypertension Sister   . Diabetes Brother   . Hypertension Brother   . Heart disease Brother 54    Died of stroke MI age 61  . Obesity Brother   . Lung cancer Paternal Uncle   . Diabetes Paternal Uncle     requiring leg amputations     Social History: Social History   Social History  . Marital Status: Married    Spouse Name: N/A  . Number of Children: 1  . Years of Education: N/A   Occupational History  . Disabled    Social History Main Topics  . Smoking status: Former Smoker    Types: Cigars    Quit date: 02/09/1983  . Smokeless tobacco: Never Used  . Alcohol Use: No  . Drug Use: No  . Sexual Activity: No  Other Topics Concern  . None   Social History Narrative   Lives with wife.      Allergies:  Allergies  Allergen Reactions  . Bydureon [Exenatide] Other (See Comments)    sweating  . Losartan Potassium Other (See Comments)    insomnia  . Gabapentin Other (See Comments)    confusion    Objective:    Vital Signs:   Temp:  [97.4 F (36.3 C)-97.7 F (36.5 C)] 97.7 F (36.5 C) (11/29 0911) Pulse Rate:  [74-82] 74 (11/29 0000) Resp:  [12-21] 16 (11/29 0911) BP: (87-106)/(57-67) 106/66 mmHg (11/29 0911) SpO2:  [91 %-100 %] 100 % (11/29 0911) Last BM Date: 01/06/15  Weight change: Filed Weights   01/06/15 0427  Weight: 201 lb 15.1 oz (91.6 kg)     Intake/Output:   Intake/Output Summary (Last 24 hours) at 01/07/15 1215 Last data filed at 01/07/15 0600  Gross per 24 hour  Intake    740 ml  Output     60 ml  Net    680 ml     Physical Exam: General:  Well appearing. No resp difficulty HEENT: normal Neck: supple. JVP 16 cm. Carotids 2+ bilat; no bruits. No lymphadenopathy or thyromegaly noted. Cor: PMI laterally displaced. Regular rate & rhythm. No rubs, gallops or murmurs. Lungs: Diminished bases, Normal effort Abdomen: soft, nontender, nondistended. No hepatosplenomegaly. No bruits or masses. Good bowel sounds. Extremities: no cyanosis, clubbing, rash. 1-2+ edema to knees Neuro: alert & orientedx3, cranial nerves grossly intact. moves all 4 extremities w/o difficulty. Affect pleasant  Telemetry: Paced in 70s, occasional 4-5 beat run of NSVT  Labs: Basic Metabolic Panel:  Recent Labs Lab 01/05/15 2354 01/06/15 0502 01/07/15 0255  NA 135 136 133*  K 3.2* 3.2* 4.3  CL 97* 98* 96*  CO2 29 28 26   GLUCOSE 167* 138* 114*  BUN 24* 24* 32*  CREATININE 3.49* 3.64* 4.69*  CALCIUM 8.4* 8.5* 9.0  MG  --   --  1.9    Liver Function Tests:  Recent Labs Lab 01/07/15 0255  AST 29  ALT 22  ALKPHOS 75  BILITOT 0.7  PROT 6.9  ALBUMIN 2.9*   No results for input(s): LIPASE, AMYLASE in the last 168 hours. No results for input(s): AMMONIA in the last 168 hours.  CBC:  Recent Labs Lab 01/05/15 2354 01/06/15 0502 01/07/15 0255  WBC 5.8 5.9 6.2  NEUTROABS  --   --  4.0  HGB 10.7* 10.7* 11.0*  HCT 34.4* 34.4* 36.6*  MCV 105.5* 105.2* 105.8*  PLT 128* 129* 156    Cardiac Enzymes:  Recent Labs Lab 01/05/15 2354  TROPONINI 0.07*    BNP: BNP (last 3 results)  Recent Labs  05/22/14 1029 05/27/14 1126 06/19/14 1030  BNP 935.2* 1627.8* 738.9*    ProBNP (last 3 results)  Recent Labs  01/14/14 1112  PROBNP 5769.0*     CBG:  Recent Labs Lab 01/06/15 1203 01/06/15 1709 01/06/15 2204  01/07/15 0418 01/07/15 0836  GLUCAP 89 87 102* 100* 116*    Coagulation Studies: No results for input(s): LABPROT, INR in the last 72 hours.    Imaging: Ct Head Wo Contrast  01/06/2015  CLINICAL DATA:  64 year old male with syncope after dialysis. Altered mental status. Initial encounter. Personal history of pituitary adenoma treated with Gamma Knife years ago. EXAM: CT HEAD WITHOUT CONTRAST TECHNIQUE: Contiguous axial images were obtained from the base of the skull through the vertex without intravenous contrast. COMPARISON:  11/14/2014  and earlier. FINDINGS: Visualized paranasal sinuses and mastoids are clear. No acute osseous abnormality identified, chronic mild enlargement of the bony sella turcica. Stable, negative visualized orbit and scalp soft tissues. Calcified atherosclerosis at the skull base. Moderate size chronic right MCA infarct with encephalomalacia appears stable. Stable gray-white matter differentiation throughout the brain. No cortically based acute infarct identified. No acute intracranial hemorrhage identified. No midline shift, mass effect, or acute intracranial mass lesion. No ventriculomegaly. No suspicious intracranial vascular hyperdensity. IMPRESSION: No acute intracranial abnormality. Chronic right MCA infarct.  Chronic pituitary adenoma. Electronically Signed   By: Genevie Ann M.D.   On: 01/06/2015 13:04   Dg Chest Port 1 View  01/06/2015  CLINICAL DATA:  Syncope. EXAM: PORTABLE CHEST 1 VIEW COMPARISON:  11/20/2014 FINDINGS: Dual-lumen right jugular central line extends to the cavoatrial junction. There is airspace opacity in the central and basilar right lung. There is a moderately large right pleural effusion. These findings are worsened from 11/20/2014. The left lung is clear. There is unchanged moderate cardiomegaly. IMPRESSION: Central and basilar airspace opacity on the right. Pleural effusion on the right. This could represent asymmetric alveolar edema and effusion.  Infectious infiltrate cannot be excluded. Electronically Signed   By: Andreas Newport M.D.   On: 01/06/2015 01:37      Medications:     Current Medications: . amiodarone  200 mg Oral Daily  . atorvastatin  40 mg Oral Daily  . bisacodyl  10 mg Oral BID  . calcium acetate  667 mg Oral TID WC  . collagenase   Topical Daily  . DULoxetine  60 mg Oral BID  . insulin aspart  5-18 Units Subcutaneous TID WC  . insulin glargine  10 Units Subcutaneous QHS  . levothyroxine  75 mcg Oral QAC breakfast  . midodrine  10 mg Oral TID WC  . sildenafil  80 mg Oral TID  . sodium chloride  3 mL Intravenous Q12H  . sodium chloride  3 mL Intravenous Q12H  . traZODone  50 mg Oral QHS     Infusions:      Assessment/Plan   1. Syncope - Will have St Jude device interrogated. If no VT/VF likely OK to go home.  - with low BPs on EMS check, may be related to hypotension, though pt has SBPs in 80-90s chronically.  2. Chronic systolic CHF - He has chronic edema in LEs. If remains in hospital longer will need HD arranged here.  - Otherwise stable on current meds. 3. ESRD - HD 4 x a week. As above.  4. Hx of A fib - Remains in NSR on amio.  5. Pulm HTN - Continue revatio.  Length of Stay:   Shirley Friar PA-C 01/07/2015, 12:15 PM  Advanced Heart Failure Team Pager [phone] (M-F; 7a - 4p)  Please contact [facility] for night-coverage after hours (4p -7a ) and weekends on [email or web address]  Patient seen with PA, agree with the above note.  1. Syncope: Patient was sitting in chair and passed out.  He was unconscious for about 30 seconds or so.  I am most concerned, given his history, for an arrhythmic cause of syncope.  In the hospital, he has had short NSVT runs (3-4 beats or so) with frequent PVCs.  SBP is stable in the 80s-100s which is his baseline, he is on midodrine chronically.  No definite infection: normal WBCs and no fever.  Cannot rule out PNA on CXR, cultures sent.  Finally,  episode occurred after  HD, may just be an issue of too much fluid off with baseline marginal BP.  - Needs ICD interrogation by [facility] => ?arrhythmia.  - Continue amiodarone.  2. Atrial fibrillation: Paroxysmal.  He is in NSR with long PR interval.  No anticoagulation given retroperitoneal bleed and recent hemothorax as well as fall risk.  3. Chronic systolic CHF: He is volume overloaded on exam with peripheral edema and JVP.  He gets HD qid, BP limits fluid removal. He does appear to be RV pacing frequently on telemetry.  Will see if St Jude can reprogram to limit RV pacing (has baseline long PR interval).   Loralie Champagne 01/07/2015

## 2015-01-07 NOTE — Discharge Instructions (Signed)
° °  It is important that you read following instructions as well as go over your medication list with RN to help you understand your care after this hospitalization.  Discharge Instructions: 1. Follow up with PCP in 1 week 2. Follow up with cardiology as scheduled.   Diet recommendation: renal diet  Activity: The patient is advised to gradually reintroduce usual activities.   Please request your primary care physician to go over all Hospital Tests and Procedure/Radiological results at the follow up,  Please get all Hospital records sent to your PCP by signing hospital release before you go home.    Do not drive, operating heavy machinery, perform activities at heights, swimming or participation in water activities or provide baby sitting services if your were admitted for while your are on Pain, Sleep and Anxiety Medications; until you have been seen by Primary Care Physician or a Neurologist and advised to do so again.  Do not take more than prescribed Pain, Sleep and Anxiety Medications.  You were cared for by a hospitalist during your hospital stay. If you have any questions about your discharge medications or the care you received while you were in the hospital after you are discharged, you can call the unit and ask to speak with the hospitalist on call if the hospitalist that took care of you is not available.   Once you are discharged, your primary care physician will handle any further medical issues.  Please note that NO REFILLS for any discharge medications will be authorized once you are discharged, as it is imperative that you return to your primary care physician (or establish a relationship with a primary care physician if you do not have one) for your aftercare needs so that they can reassess your need for medications and monitor your lab values.  You Must read complete instructions/literature along with all the possible adverse reactions/side effects for all the Medicines you  take and that have been prescribed to you. Take any new Medicines after you have completely understood and accept all the possible adverse reactions/side effects.  Wear Seat belts while driving.

## 2015-01-07 NOTE — Evaluation (Signed)
Occupational Therapy Evaluation and Discharge Patient Details Name: Larry [name] MRN: [id number] DOB: 04-11-1950 Today's Date: 01/07/2015    History of Present Illness Larry [name] is a 64 y.o. 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   Clinical Impression   This 64 yo male admitted with above presents to acute OT with above. All education completed and pt to D/C home today. Acute OT will sign off.    Follow Up Recommendations  No OT follow up    Equipment Recommendations  None recommended by OT       Precautions / Restrictions Precautions Precautions: Fall Restrictions Weight Bearing Restrictions: No      Mobility Bed Mobility               General bed mobility comments: pt in recliner upon arrival          ADL Overall ADL's : Needs assistance/impaired Eating/Feeding: Independent;Sitting   Grooming: Set up;Sitting   Upper Body Bathing: Set up;Sitting   Lower Body Bathing: Moderate assistance (min guard A sit<>stand)   Upper Body Dressing : Set up;Sitting   Lower Body Dressing: Moderate assistance (min guard A sit<>stand)   Toilet Transfer: Min guard;Ambulation;RW   Toileting- Water quality scientist and Hygiene: Min guard;Sit to/from stand         General ADL Comments: Educated pt and wife on use of wide sock aid. Asked wife to ask HHOT to address if rollator would be good for pt. Wife reports pt has trouble with wiping himself post bowel movement--I recommnended an attachable bidet for toilet               Pertinent Vitals/Pain Pain Assessment: No/denies pain     Hand Dominance Right   Extremity/Trunk Assessment Upper Extremity Assessment Upper Extremity Assessment: Overall WFL for tasks assessed           Communication  Communication Communication: No difficulties   Cognition Arousal/Alertness: Awake/alert Behavior During Therapy: WFL for tasks assessed/performed Overall Cognitive Status: Within Functional Limits for tasks assessed                                Home Living Family/patient expects to be discharged to:: Private residence Living Arrangements: Spouse/significant other Available Help at Discharge: Family;Available 24 hours/day (wife off for rest of week) Type of Home: House Home Access: Ramped entrance Entrance Stairs-Number of Steps: 2 Entrance Stairs-Rails: None Home Layout: Two level;Bed/bath upstairs;1/2 bath on main level Alternate Level Stairs-Number of Steps: 14 Alternate Level Stairs-Rails: Right Bathroom Shower/Tub: Occupational psychologist: Standard     Home Equipment: Bedside commode;Walker - 2 wheels;Hospital bed;Wheelchair - manual   Additional Comments: Pt staying on main level.      Prior Functioning/Environment Level of Independence: Needs assistance  Gait / Transfers Assistance Needed: could walk some with RW in home by himself ADL's / Homemaking Assistance Needed: sponge bathing   Comments: Just came home from Rehab Mon, Nov 21.  Wife and caregivers close to 24 hours day.  Had home helpers full days Tues and Thurs. M,W, F Dialysis    OT Diagnosis: Generalized weakness   OT Problem List: Decreased strength      OT Goals(Current goals can be found  in the care plan section) Acute Rehab OT Goals Patient Stated Goal: to go home today OT Goal Formulation: With patient/family  OT Frequency:                End of Session Equipment Utilized During Treatment: Rolling walker  Activity Tolerance: Patient tolerated treatment well Patient left: in chair;with call bell/phone within reach   Time: 1449-1525 OT Time Calculation (min): 36 min Charges:  OT General Charges $OT Visit: 1 Procedure OT Evaluation $Initial OT Evaluation Tier I: 1  Procedure OT Treatments $Self Care/Home Management : 8-22 mins  Almon Register [id number] 01/07/2015, 5:14 PM

## 2015-01-07 NOTE — Progress Notes (Addendum)
No VT/VF or mode switches on full interrogation.   OK for home with current [facility] and PT resumption from cardiac perspective.     Pacing 33% of the time.  400 ms delay in order to conduct, will drop back to 200 ms if progresses to CHB.  We appreciate you consulting [place]. We have set up follow up in the HF clinic in 2 weeks.  Legrand Como [address] [place], PA-C 01/07/2015 2:28 PM

## 2015-01-07 NOTE — Discharge Summary (Signed)
[facility] Discharge Summary   Patient: Larry [name]    [id number] PCP: [name], MD    DOB: 21-Dec-1950 Date of admission: 01/05/2015  Date of discharge: 01/07/2015   Discharge Diagnoses:  Principal Problem:   Syncope Active Problems:   Essential hypertension, benign   Persistent atrial fibrillation (HCC)   Automatic implantable cardioverter-defibrillator in situ   DM2 (diabetes mellitus, type 2) (HCC)   OSA (obstructive sleep apnea), intolerant to CPAP/BIPAP   Chronic systolic CHF (congestive heart failure) (HCC)   Pressure ulcer   ESRD (end stage renal disease) on dialysis [facility])   Recommendations for Outpatient Follow-up:  1. Follow-up with cardiology as well as PCP as scheduled   Diet recommendation: Cardiac and renal diet  Activity: As tolerated  Discharge Condition: fair  History of present illness: From the H&P, "Larry [name] is a 64 y.o. 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  The patient had dialysis today, during which they took him only about 1 lb under his dry weight, and then was at home tonight watching television with his wife, when he started to get "hot", restless (she thought he was having a panic attack), and then "fell out", with his eyes rolled back slumped over in his chair. The patient's wife noted a few clonic jerks but no tonic movements, grunting. He was unconscious long enough that she called 911 who told her to pull him on the floor in preparation for CPR, and around that time he started to wake up and was groggy. There were no palpitations, chest pain, or dyspnea at the time of event. No ICD discharge.  When EMS arrived, the patient's blood sugar was normal, and he was slightly hypotensive in the 70s over 50s. In the ED,  the patient got gentle IV fluids and his blood pressure returned to 0000000 to [id number] systolic which is his normal"  Hospital Course:  Summary of his active problems in the hospital is as following. Principal Problem:   Syncope Patient presented with an episode of syncope. This appears to be most likely in the setting of hemodialysis with possibly over ultrafiltration. The patient after receiving gentle IV hydration in the ER as well as on the floor has been at his baseline and did not have any further episode of dizziness and lightheadedness even working with physical therapy. His orthostatic vitals remained stable. The patient has chronically low blood pressure as well as low systolic function.     Persistent atrial fibrillation (HCC)   Automatic implantable cardioverter-defibrillator in situ   Multiple PVCs Telemetry was showing multiple bigeminy as well as trigeminy. QTC was prolonged QRS was prolonged. Patient is on amiodarone at home. Cardiology was consulted and had patient's pacemaker and ICD was interrogated. Patient will follow up with cardiology as an outpatient in 2 weeks. Recommended to continue current management.    Chronic systolic CHF (congestive heart failure) (HCC) Chronically low blood pressure secondary to low ejection fraction as well as ESRD status. Patient is currently on midodrine which will be continued on discharge.    Pressure ulcer Wound care was consulted and recommendations were made. Patient will continue to have home health with RN.    ESRD (end stage renal disease) on dialysis [facility]) Patient will continue his hemodialysis as scheduled, Monday  Wednesday Friday.  All other chronic medical condition were stable during the hospitalization. Patient was seen by physical therapy, who recommended home health, which was arranged by Education officer, museum and case Freight forwarder. On the day of the discharge the patient did not have any further episodes of dizziness or syncope, and  no other acute medical condition were reported by patient. the patient was felt safe to be discharge at home with home health.  Procedures and Results:  ICD interrogation   Consultations:  cardiology  Discharge Exam: Filed Weights   01/06/15 0427  Weight: 91.6 kg (201 lb 15.1 oz)   Filed Vitals:   01/07/15 0911 01/07/15 1200  BP: 106/66 86/60  Pulse:  73  Temp: 97.7 F (36.5 C) 97.9 F (36.6 C)  Resp: 16 12    General: alert and awake not in distress Cardiovascular: S1 and S2 present Respiratory: clear to auscultation Abdomen: bowel sounds are present Extremity: bilateral edema, chronic ulcers  DISCHARGE MEDICATION: Discharge Instructions    Call MD for:  extreme fatigue    Complete by:  As directed      Call MD for:  persistant dizziness or light-headedness    Complete by:  As directed      Diet - low sodium heart healthy    Complete by:  As directed      Increase activity slowly    Complete by:  As directed           Discharge Medication List as of 01/07/2015  3:15 PM    CONTINUE these medications which have NOT CHANGED   Details  acetaminophen (TYLENOL) 325 MG tablet Take 650 mg by mouth every 4 (four) hours as needed for moderate pain or fever., Until Discontinued, Historical Med    ALPRAZolam (XANAX) 0.5 MG tablet Take 0.5 mg by mouth every 8 (eight) hours as needed for anxiety., Until Discontinued, Historical Med    amiodarone (PACERONE) 400 MG tablet Take 0.5 tablets (200 mg total) by mouth daily., Starting 10/29/2014, Until Discontinued, No Print    atorvastatin (LIPITOR) 40 MG tablet TAKE 1 TABLET DAILY, Normal    bisacodyl (DULCOLAX) 10 MG suppository Place 1 suppository (10 mg total) rectally daily as needed for moderate constipation., Starting 10/11/2014, Until Discontinued, No Print    bisacodyl (DULCOLAX) 5 MG EC tablet Take 10 mg by mouth 2 (two) times daily., Until Discontinued, Historical Med    calcium acetate (PHOSLO) 667 MG capsule Take 667  mg by mouth 3 (three) times daily with meals., Until Discontinued, Historical Med    chlorhexidine (PERIDEX) 0.12 % solution Perform mouth rinses twice daily after breakfast and at bedtime., Print    DULoxetine (CYMBALTA) 30 MG capsule Take 60 mg by mouth 2 (two) times daily. , Until Discontinued, Historical Med    insulin glargine (LANTUS) 100 unit/mL SOPN Inject 0.1 mLs (10 Units total) into the skin at bedtime., Starting 11/22/2014, Until Discontinued, No Print    insulin lispro (HUMALOG KWIKPEN) 100 UNIT/ML KiwkPen Inject 5-18 Units into the skin 3 (three) times daily as needed. Sliding scale: 121-150 = 5 units 151-200 = 7 units 201-250 = 9 units 251-300 = 11 units 301-350 = 13 units 351-400 = 15 units >400 = 18 units, call MD, Until Discontinued, Historical  Med    levothyroxine (SYNTHROID, LEVOTHROID) 75 MCG tablet TAKE 1 TABLET ON AN EMPTY STOMACH 30 MINUTES BEFORE BREAKFAST, Historical Med    midodrine (PROAMATINE) 10 MG tablet Take 1 tablet (10 mg total) by mouth 3 (  three) times daily., Starting 10/11/2014, Until Discontinued, No Print    multivitamin (RENA-VIT) TABS tablet Take 1 tablet by mouth at bedtime., Starting 10/11/2014, Until Discontinued, Normal    ondansetron (ZOFRAN) 4 MG tablet Take 1 tablet (4 mg total) by mouth every 8 (eight) hours as needed for nausea or vomiting., Starting 10/11/2014, Until Discontinued, Print    oxyCODONE-acetaminophen (PERCOCET) 10-325 MG tablet Take 1-2 tablets by mouth every 4 (four) hours as needed for pain., Starting 12/17/2014, Until Discontinued, Print    Pollen Extracts (PROSTAT PO) Take 30 mLs by mouth daily., Until Discontinued, Historical Med    polyethylene glycol (MIRALAX / GLYCOLAX) packet Take 17 g by mouth daily as needed for moderate constipation., Starting 10/11/2014, Until Discontinued, No Print    sildenafil (REVATIO) 20 MG tablet Take 4 tablets (80 mg total) by mouth 3 (three) times daily., Starting 08/24/2014, Until  Discontinued, Print    traZODone (DESYREL) 50 MG tablet Take 50 mg by mouth at bedtime., Until Discontinued, Historical Med       Allergies  Allergen Reactions  . Bydureon [Exenatide] Other (See Comments)    sweating  . Losartan Potassium Other (See Comments)    insomnia  . Gabapentin Other (See Comments)    confusion   Follow-up Information    Follow up with Loralie Champagne, MD On 01/22/2015.   Specialty:  Cardiology   Why:  at 340 for post hospital follow up. Please bring your medications to your visit. Pt code is 0009.   Contact information:   [address] [place] [address] [phone]       Follow up with [name], MD. Schedule an appointment as soon as possible for a visit in 1 week.   Specialty:  Family Medicine   Contact information:   [place] [address] [phone]       The results of significant diagnostics from this hospitalization (including imaging, microbiology, ancillary and laboratory) are listed below for reference.    Significant Diagnostic Studies: Ct Head Wo Contrast  01/06/2015  CLINICAL DATA:  64 year old male with syncope after dialysis. Altered mental status. Initial encounter. Personal history of pituitary adenoma treated with Gamma Knife years ago. EXAM: CT HEAD WITHOUT CONTRAST TECHNIQUE: Contiguous axial images were obtained from the base of the skull through the vertex without intravenous contrast. COMPARISON:  11/14/2014 and earlier. FINDINGS: Visualized paranasal sinuses and mastoids are clear. No acute osseous abnormality identified, chronic mild enlargement of the bony sella turcica. Stable, negative visualized orbit and scalp soft tissues. Calcified atherosclerosis at the skull base. Moderate size chronic right MCA infarct with encephalomalacia appears stable. Stable gray-white matter differentiation throughout the brain. No cortically based acute infarct identified. No acute intracranial  hemorrhage identified. No midline shift, mass effect, or acute intracranial mass lesion. No ventriculomegaly. No suspicious intracranial vascular hyperdensity. IMPRESSION: No acute intracranial abnormality. Chronic right MCA infarct.  Chronic pituitary adenoma. Electronically Signed   By: Genevie Ann M.D.   On: 01/06/2015 13:04   Dg Chest Port 1 View  01/06/2015  CLINICAL DATA:  Syncope. EXAM: PORTABLE CHEST 1 VIEW COMPARISON:  11/20/2014 FINDINGS: Dual-lumen right jugular central line extends to the cavoatrial junction. There is airspace opacity in the central and basilar right lung. There is a moderately large right pleural effusion. These findings are worsened from 11/20/2014. The left lung is clear. There is unchanged moderate cardiomegaly. IMPRESSION: Central and basilar airspace opacity on the right. Pleural effusion on the right. This  could represent asymmetric alveolar edema and effusion. Infectious infiltrate cannot be excluded. Electronically Signed   By: Andreas Newport M.D.   On: 01/06/2015 01:37    Microbiology: Recent Results (from the past 240 hour(s))  Blood culture (routine x 2)     Status: None (Preliminary result)   Collection Time: 01/06/15 12:12 AM  Result Value Ref Range Status   Specimen Description LEFT ANTECUBITAL  Final   Special Requests IN PEDIATRIC BOTTLE 2CC  Final   Culture NO GROWTH 1 DAY  Final   Report Status PENDING  Incomplete  Blood culture (routine x 2)     Status: None (Preliminary result)   Collection Time: 01/06/15  5:00 AM  Result Value Ref Range Status   Specimen Description BLOOD LEFT ANTECUBITAL  Final   Special Requests BOTTLES DRAWN AEROBIC ONLY 5CC  Final   Culture NO GROWTH 1 DAY  Final   Report Status PENDING  Incomplete  MRSA PCR Screening     Status: None   Collection Time: 01/06/15  5:07 AM  Result Value Ref Range Status   MRSA by PCR NEGATIVE NEGATIVE Final    Comment:        The GeneXpert MRSA Assay (FDA approved for NASAL  specimens only), is one component of a comprehensive MRSA colonization surveillance program. It is not intended to diagnose MRSA infection nor to guide or monitor treatment for MRSA infections.      Labs: CBC:  Recent Labs Lab 01/05/15 2354 01/06/15 0502 01/07/15 0255  WBC 5.8 5.9 6.2  NEUTROABS  --   --  4.0  HGB 10.7* 10.7* 11.0*  HCT 34.4* 34.4* 36.6*  MCV 105.5* 105.2* 105.8*  PLT 128* 129* [id number]   Basic Metabolic Panel:  Recent Labs Lab 01/05/15 2354 01/06/15 0502 01/07/15 0255  NA 135 136 133*  K 3.2* 3.2* 4.3  CL 97* 98* 96*  CO2 29 28 26   GLUCOSE 167* 138* 114*  BUN 24* 24* 32*  CREATININE 3.49* 3.64* 4.69*  CALCIUM 8.4* 8.5* 9.0  MG  --   --  1.9   Liver Function Tests:  Recent Labs Lab 01/07/15 0255  AST 29  ALT 22  ALKPHOS 75  BILITOT 0.7  PROT 6.9  ALBUMIN 2.9*   No results for input(s): LIPASE, AMYLASE in the last 168 hours. No results for input(s): AMMONIA in the last 168 hours.  Cardiac Enzymes:  Recent Labs Lab 01/05/15 2354  TROPONINI 0.07*   BNP (last 3 results)  Recent Labs  05/22/14 1029 05/27/14 1126 06/19/14 1030  BNP 935.2* 1627.8* 738.9*    ProBNP (last 3 results)  Recent Labs  01/14/14 1112  PROBNP 5769.0*    CBG:  Recent Labs Lab 01/06/15 1203 01/06/15 1709 01/06/15 2204 01/07/15 0418 01/07/15 0836  GLUCAP 89 87 102* 100* 116*    Time spent: 35 minutes  Signed:  Daniqua Campoy  [facility] 01/07/2015, 11:03 PM

## 2015-01-07 NOTE — Progress Notes (Signed)
Discharged home via wheelchair with wife at bedside. Patient shows no signs of distress at this time.  Alert and oriented x4 and follows all commands.

## 2015-01-08 NOTE — Care Management Note (Signed)
Case Management Note  Patient Details  Name: Larry [name] MRN: [id number] Date of Birth: Nov 16, 1950  Subjective/Objective:     LATE ENTRY:  Pt currently receiving home health services from [facility], resuming services on discharge.  Order and documents faxed to home health agency.                           Expected Discharge Plan:  [place]  Discharge planning Services  CM Consult  Post Acute Care Choice:  Resumption of Svcs/PTA Provider  HH Arranged:  RN, PT, OT, Nurse's Aide [facility] Agency:  [place]  Status of Service:  Completed, signed off   Girard Cooter, [place] 01/08/2015, 7:02 AM

## 2015-01-09 ENCOUNTER — Encounter: Payer: Self-pay | Admitting: Cardiology

## 2015-01-10 ENCOUNTER — Telehealth (HOSPITAL_COMMUNITY): Payer: Self-pay | Admitting: *Deleted

## 2015-01-10 ENCOUNTER — Telehealth: Payer: Self-pay | Admitting: Cardiology

## 2015-01-10 NOTE — Telephone Encounter (Signed)
Spoke with Jonni Sanger PA and [place] about this and he said that due to his passing out was not due to his heart, we would not be able to use his heart as a reason for him to get home health aide. She will need to contact PCP and maybe they can help out.

## 2015-01-10 NOTE — Telephone Encounter (Signed)
Larry [name] called to let us know that she needs help with Larry [name] at home. She would like someone to stay with him for 2 days a week while she is at work. [facility] said that we would have to write a letter Psychologist, clinical form) to them stating that we recommend someone to stay with him. Then [facility] might would make an exception. She has someone to help them out on M/W/F/S. They have [place] now to assist them but not someone to stay with him for 8 hours on Tuesday and Thursday.  Pt can't walk by himself, wife is scared since he passed out on her to leave him at home.  Any help would be greatly appreciated.

## 2015-01-10 NOTE — Telephone Encounter (Signed)
Spoke w/ pt wife and requested that pt send a manual transmission w/ his home monitor b/c we have not received one in at least 8 days. PT wife verbalized understanding

## 2015-01-11 LAB — CULTURE, BLOOD (ROUTINE X 2)
CULTURE: NO GROWTH
Culture: NO GROWTH

## 2015-01-13 ENCOUNTER — Emergency Department (HOSPITAL_COMMUNITY)
Admission: EM | Admit: 2015-01-13 | Discharge: 2015-02-09 | Disposition: E | Payer: Commercial Managed Care - HMO | Attending: Physician Assistant | Admitting: Physician Assistant

## 2015-01-13 DIAGNOSIS — I872 Venous insufficiency (chronic) (peripheral): Secondary | ICD-10-CM | POA: Diagnosis not present

## 2015-01-13 DIAGNOSIS — Z992 Dependence on renal dialysis: Secondary | ICD-10-CM | POA: Insufficient documentation

## 2015-01-13 DIAGNOSIS — I469 Cardiac arrest, cause unspecified: Secondary | ICD-10-CM | POA: Diagnosis not present

## 2015-01-13 DIAGNOSIS — Z9889 Other specified postprocedural states: Secondary | ICD-10-CM | POA: Insufficient documentation

## 2015-01-13 DIAGNOSIS — N186 End stage renal disease: Secondary | ICD-10-CM | POA: Insufficient documentation

## 2015-01-13 MED ORDER — SODIUM BICARBONATE 8.4 % IV SOLN
INTRAVENOUS | Status: AC | PRN
  Administered 2015-01-13: 50 meq via INTRAVENOUS

## 2015-01-13 MED ORDER — SODIUM CHLORIDE 0.9 % IV SOLN
INTRAVENOUS | Status: AC | PRN
  Administered 2015-01-13: 1000 mL via INTRAVENOUS

## 2015-01-13 MED ORDER — EPINEPHRINE HCL 0.1 MG/ML IJ SOSY
PREFILLED_SYRINGE | INTRAMUSCULAR | Status: AC | PRN
  Administered 2015-01-13 (×2): 1 via INTRAVENOUS

## 2015-01-13 NOTE — ED Provider Notes (Addendum)
CSN: [id number]     Arrival date & time 01/30/2015  1902 History   First MD Initiated Contact with Patient 02/08/2015 1910     No chief complaint on file.    (Consider location/radiation/quality/duration/timing/severity/associated sxs/prior Treatment) HPI  Patient is a 64 year old male with past medical history significant for end-stage renal disease on dialysis. Patient was awaiting LVAD and was recently taken off LVAD list because he was so ill. Patient went to dialysis today came back. He was reportedly feeling weak and then became responsive. EMS was called. EMS completed an hour and 15 minutes of CPR prior to arrival. Including multiple doses of epi, bicarbonate, calcium. EMS felt there was return of spontaneous retrun of criculation 5 minutes prior to arrival. So on arrival patient did not have thumper activated.  Level V caveat altered mental status  No past medical history on file. No past surgical history on file. No family history on file. Social History  Substance Use Topics  . Smoking status: Not on file  . Smokeless tobacco: Not on file  . Alcohol Use: Not on file    Review of Systems  Unable to perform ROS: Mental status change      Allergies  Review of patient's allergies indicates not on file.  Home Medications   Prior to Admission medications   Not on File   There were no vitals taken for this visit. Physical Exam  Constitutional: He appears well-nourished.  HENT:  Head: Normocephalic.  Eyes:  No pupil reaction.  Cardiovascular:  No pulses  Pulmonary/Chest:  no spontaneous breaths  Neurological:  Intubated  Skin:  Patient has chronic venous changes bilateral lower external knee. Dialysis port in the upper right chest wall. No evidence of external trauma.    ED Course  Procedures (including critical care time) Labs Review Labs Reviewed - No data to display  Imaging Review No results found. I have personally reviewed and evaluated these images  and lab results as part of my medical decision-making.   EKG Interpretation None      MDM   Final diagnoses:  None    Patient is 64 year old male chronically ill with end-stage renal disease taken off the LVAD list. Presenting with an hour and half of CPR. Patient had no pulses arrival. Thumper activated. 2 rounds of epi given. No spontaneous return of circulation . Patient had no pulses. Patient PEA on monitor. TOD called by me  Patient's PCP notified and they will sign death certificate.  Cardiopulmonary Resuscitation (CPR) Procedure Note Directed/Performed by: Gardiner Sleeper I personally directed ancillary staff and/or performed CPR in an effort to regain return of spontaneous circulation and to maintain cardiac, neuro and systemic perfusion.    CRITICAL CARE Performed by: Gardiner Sleeper Total critical care time: 30 minutes Critical care time was exclusive of separately billable procedures and treating other patients. Critical care was necessary to treat or prevent imminent or life-threatening deterioration. Critical care was time spent personally by me on the following activities: development of treatment plan with patient and/or surrogate as well as nursing, discussions with consultants, evaluation of patient's response to treatment, examination of patient, obtaining history from patient or surrogate, ordering and performing treatments and interventions, ordering and review of laboratory studies, ordering and review of radiographic studies, pulse oximetry and re-evaluation of patient's condition.     Leathia Farnell Julio Alm, MD 02/03/2015 2002  [place], MD 01/30/2015 2002

## 2015-01-13 NOTE — Code Documentation (Signed)
Patient time of death occurred at 24

## 2015-01-13 NOTE — Code Documentation (Addendum)
Pulse check. No pulse present 

## 2015-01-13 NOTE — ED Notes (Signed)
Pt arrived by [facility] with thumper in place, intubated with manual bag. Stated that pt was scheduled for LVAP, but has not been placed yet due to a fall and broken leg. Has been in rehab. Went to dialysis today around 12:30, said he was feeling tired and had some seizure like activity. BP dropped. CPR was started around 1745. EMS gave 15 of epi, 2 amps bicarb, 1 of calcium, 2L cold saline. CGB was 211 and 169mL frothy fluid was suctioned. Pt pulseless on arrival.

## 2015-01-13 NOTE — Progress Notes (Signed)
Chaplain responded to the request of the ED Nursing Unit to provide pastoral support to the family of patient who was brought in via EMS with CPR in progress.  The patient was unable to be revived and died.  The family arrived a short time later, Chaplain support the Dr. As the family was informed of the death.  Chaplain provided the ministry of presence, and empathic listening as they shared life stories of the patient. Comfort measures were extended, and they were informed of all necessary information needed by the medical staff for final arrangement for the patient. Family visitation was also supervised by the Chaplain.  The family was appreciative of all support provided by the medical staff, they were escorted to the lobby to leave the hospital.  Chaplain Yaakov Guthrie [phone]

## 2015-01-13 NOTE — ED Notes (Signed)
No pulse present when pt moved from EMS stretcher to bed. CPR started.

## 2015-01-14 ENCOUNTER — Other Ambulatory Visit: Payer: Self-pay

## 2015-01-17 ENCOUNTER — Telehealth: Payer: Self-pay | Admitting: Internal Medicine

## 2015-01-17 NOTE — Telephone Encounter (Signed)
New problem     Per wife calling pt passed away and want to know does she need to bring device machine back. Please advise wife.

## 2015-01-17 NOTE — Telephone Encounter (Signed)
Informed pt wife the company does not refurbish the home monitors. She can throw the monitor away or recycle it. She verbalized understanding.

## 2015-01-17 NOTE — Telephone Encounter (Signed)
[id number]/[id number] 

## 2015-01-22 ENCOUNTER — Encounter (HOSPITAL_COMMUNITY): Payer: Self-pay

## 2015-02-07 MED FILL — Medication: Qty: 1 | Status: AC

## 2015-02-09 DEATH — deceased

## 2015-02-13 ENCOUNTER — Encounter (HOSPITAL_COMMUNITY): Payer: Self-pay

## 2015-02-13 ENCOUNTER — Encounter: Payer: Self-pay | Admitting: Vascular Surgery

## 2015-03-10 ENCOUNTER — Other Ambulatory Visit (HOSPITAL_COMMUNITY): Payer: Self-pay | Admitting: Cardiology

## 2015-03-10 ENCOUNTER — Other Ambulatory Visit: Payer: Self-pay | Admitting: Internal Medicine

## 2015-06-19 NOTE — Progress Notes (Signed)
This encounter was created in error - please disregard.  This encounter was created in error - please disregard.

## 2015-08-11 ENCOUNTER — Encounter (HOSPITAL_COMMUNITY): Payer: Self-pay

## 2015-08-11 NOTE — Progress Notes (Signed)
Physician Statement Forms completed for critical illness benefits claim on behalf of patient's spouse for deceased patient. 24 pages including forms and supporting documents faxed to [employer] at provided # [phone] Copy of forms scanned into patient's and spouse's electronic medical records. Policy # [id number]  Renee Pain

## 2016-07-04 IMAGING — US US RENAL
1 series · 14 of 25 positions shown · non-contrast
Comparison: Unenhanced CT abdomen and pelvis 05/27/2014.

CLINICAL DATA: Current history of hypertension and diabetes,
presenting with acute kidney injury and oliguria.

EXAM:
RENAL / URINARY TRACT ULTRASOUND COMPLETE

[Series 1: us renal · 0.24mm/px · 14 of 34 slices shown]
[im 1/34]
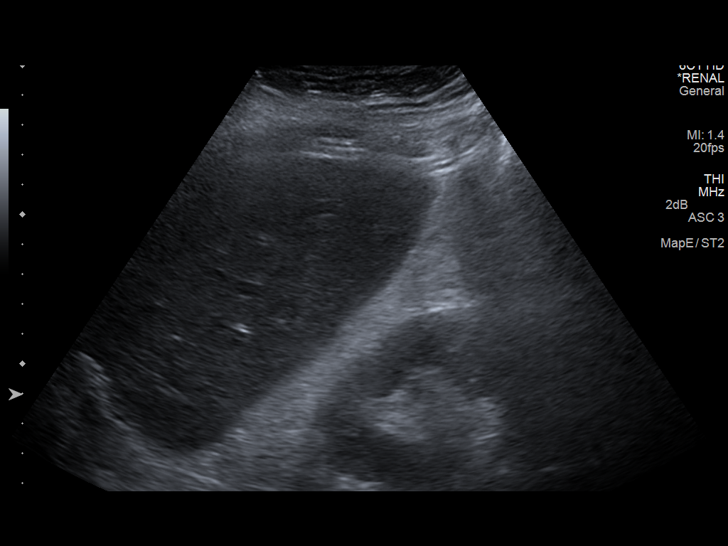
[im 3/34]
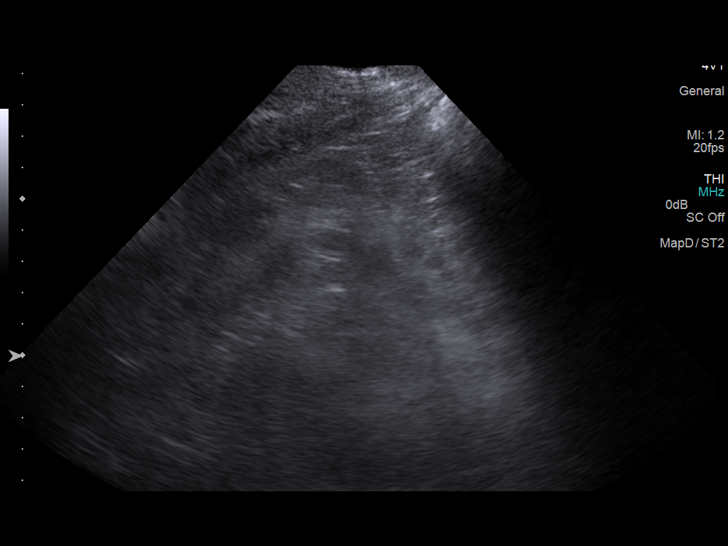
[im 6/34]
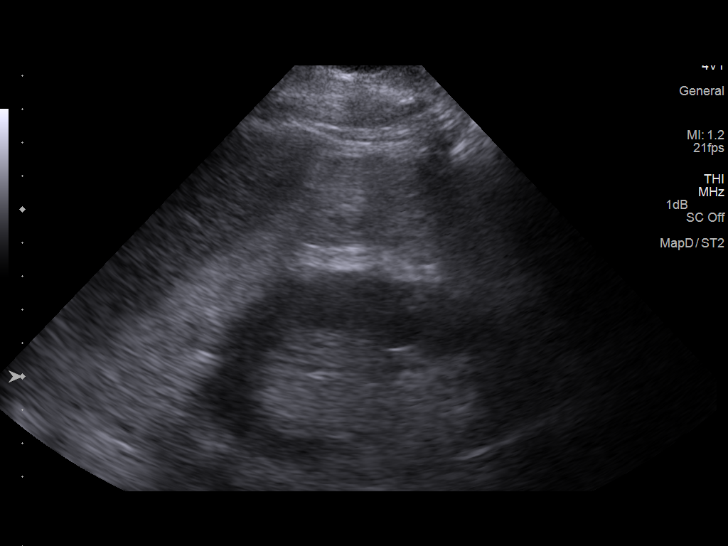
[im 9/34]
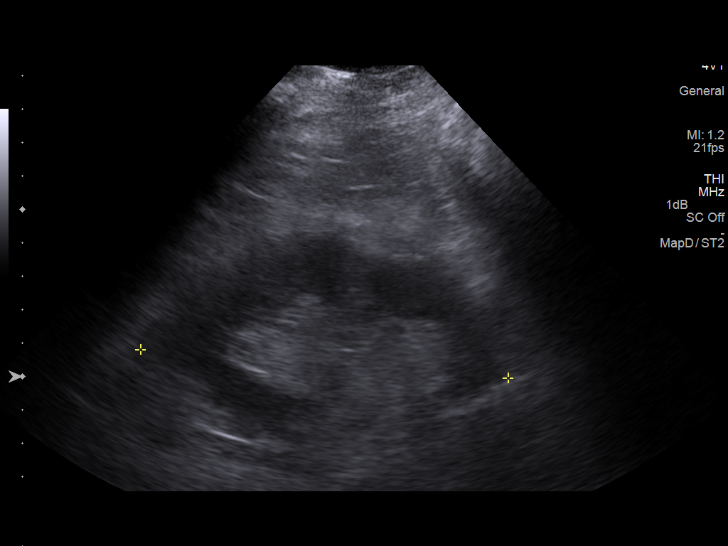
[im 12/34]
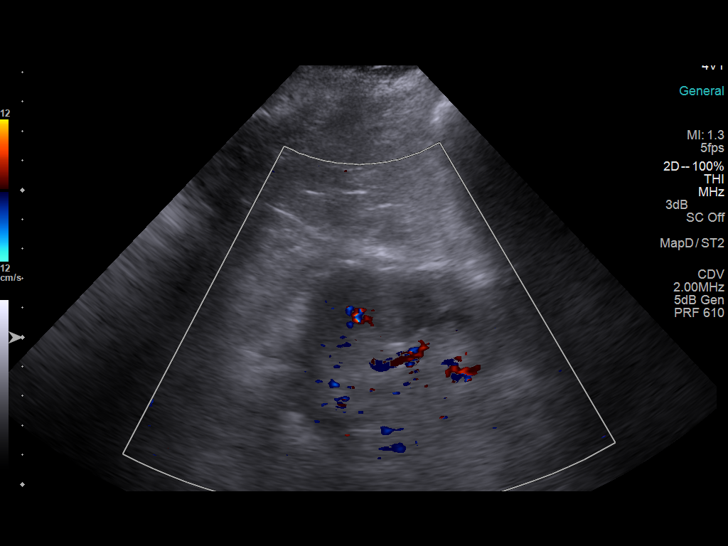
[im 13/34]
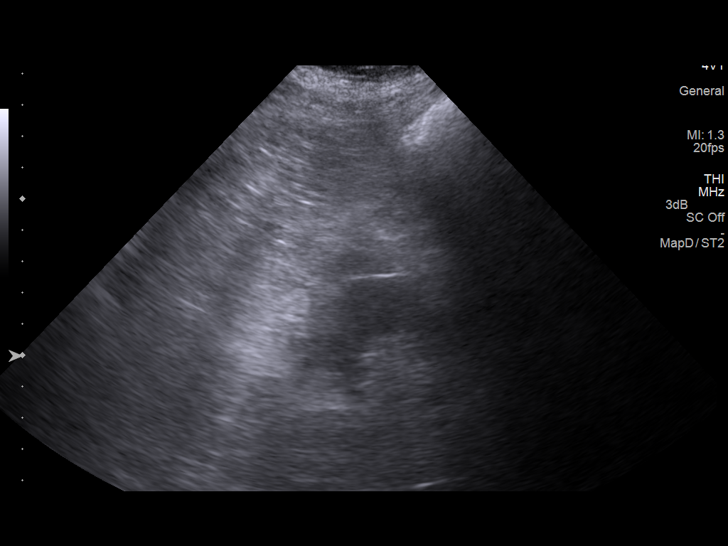
[im 16/34]
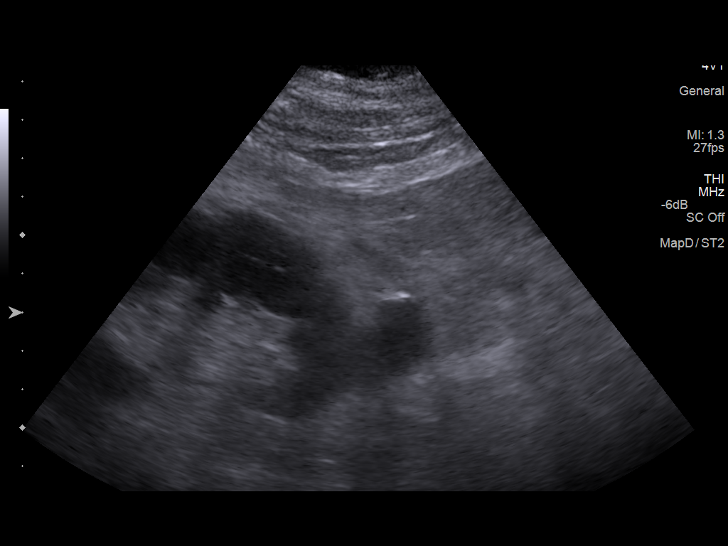
[im 18/34]
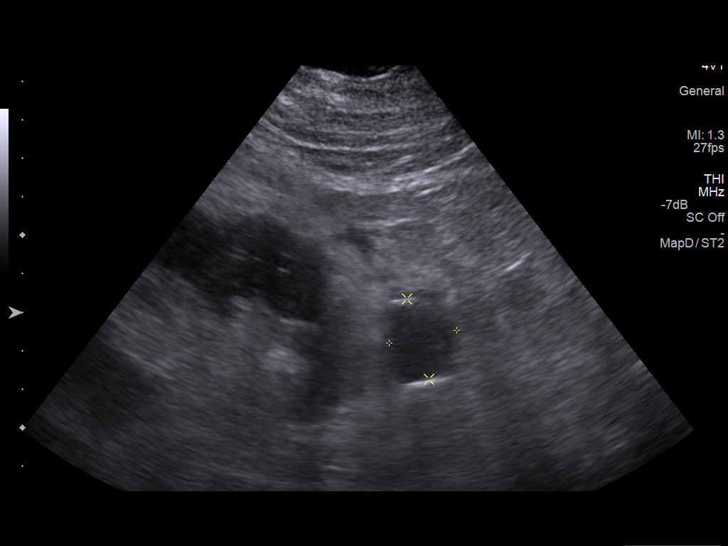
[im 21/34]
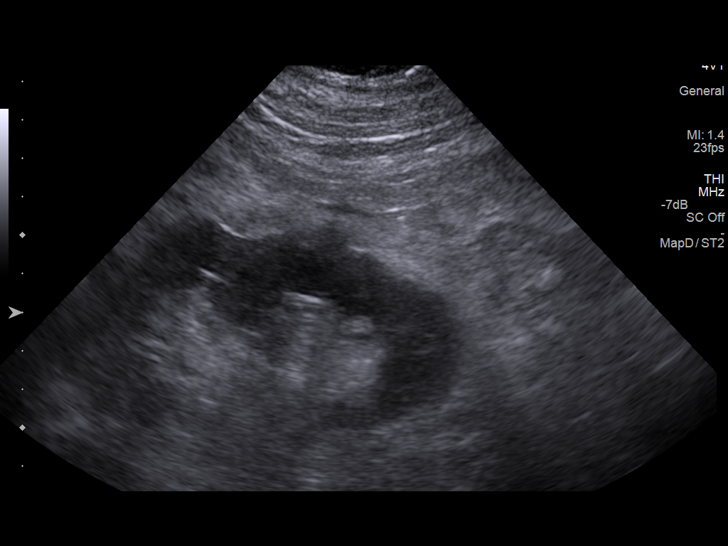
[im 23/34]
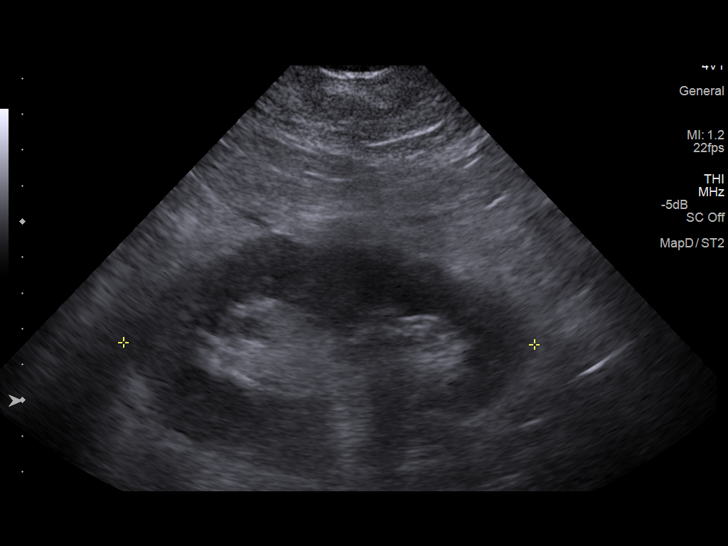
[im 25/34]
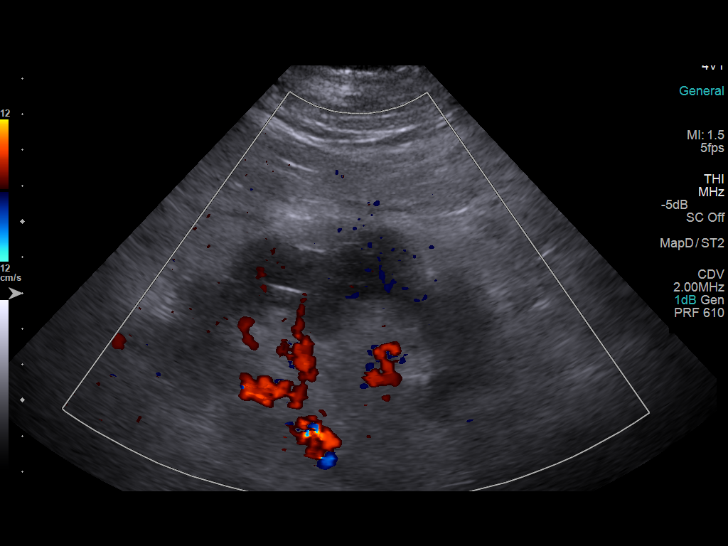
[im 28/34]
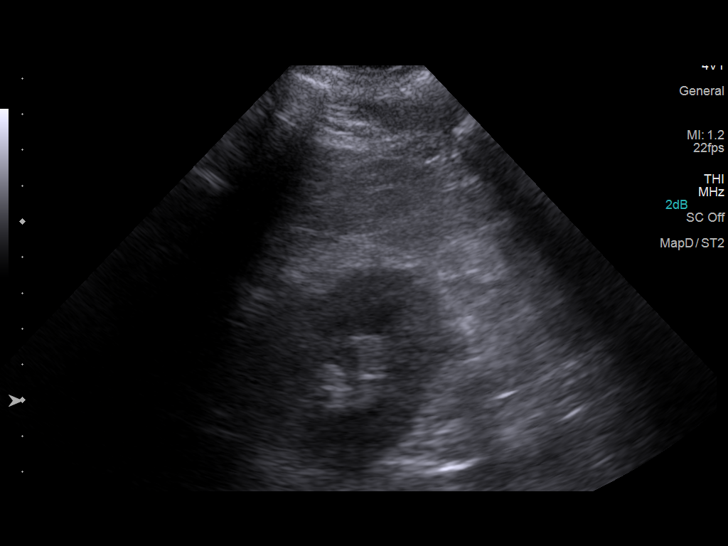
[im 31/34]
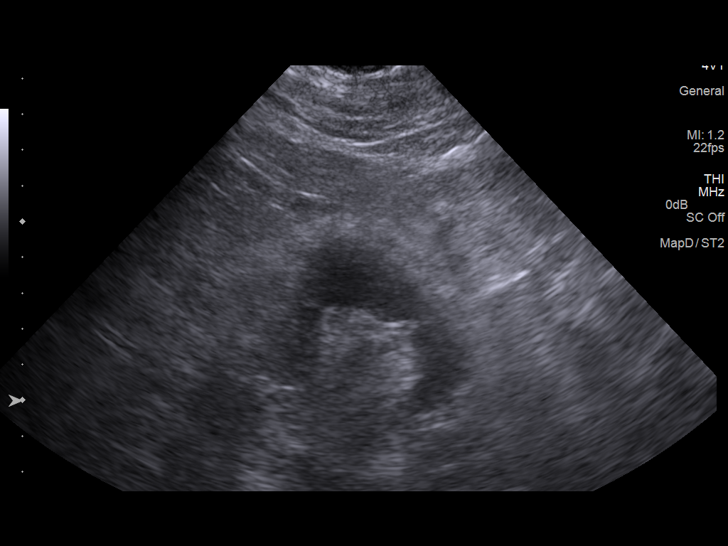
[im 34/34]
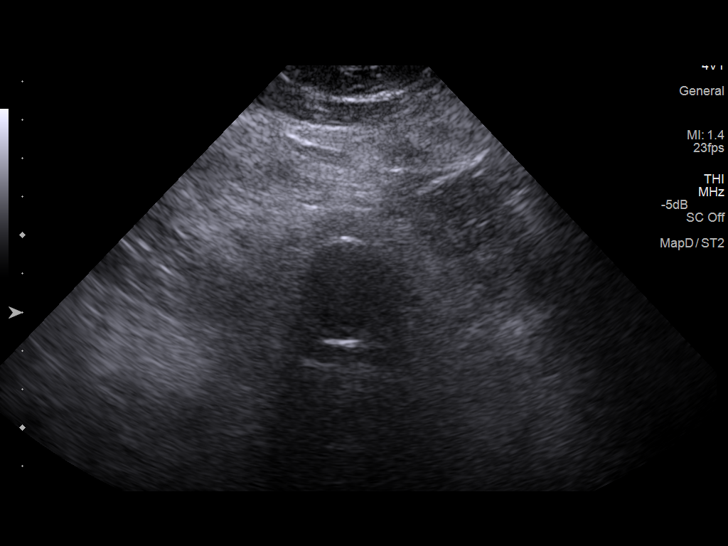

[14 of 25 positions shown; findings below may reference images not displayed]

FINDINGS: Right Kidney:

Length: Approximately 11.1 cm. No hydronephrosis. Well-preserved
cortex. No shadowing calculi. Normal parenchymal echotexture. No
focal parenchymal abnormality.

Left Kidney:

Length: Approximate 1.5 cm. No hydronephrosis. Well-preserved
cortex. No shadowing calculi. Normal parenchymal echotexture.
Exophytic 1.8 x 2.2 x 1.7 cm hypoechoic mass with acoustic
enhancement and no internal color Doppler flow, shown on the prior
CT to represent a hemorrhagic cyst. No significant focal parenchymal
abnormality.

Bladder:

Decompressed by Foley catheter.
IMPRESSION: No significant abnormalities. Approximate 2 cm cyst arising from the
lower pole of the left kidney, unchanged from the CT in May 2014.

## 2016-10-01 IMAGING — CR DG CHEST 1V PORT
1 series · 1 of 1 positions shown · non-contrast
Comparison: 08/22/2014

CLINICAL DATA: Shortness of Breath

EXAM:
PORTABLE CHEST - 1 VIEW

[AP]
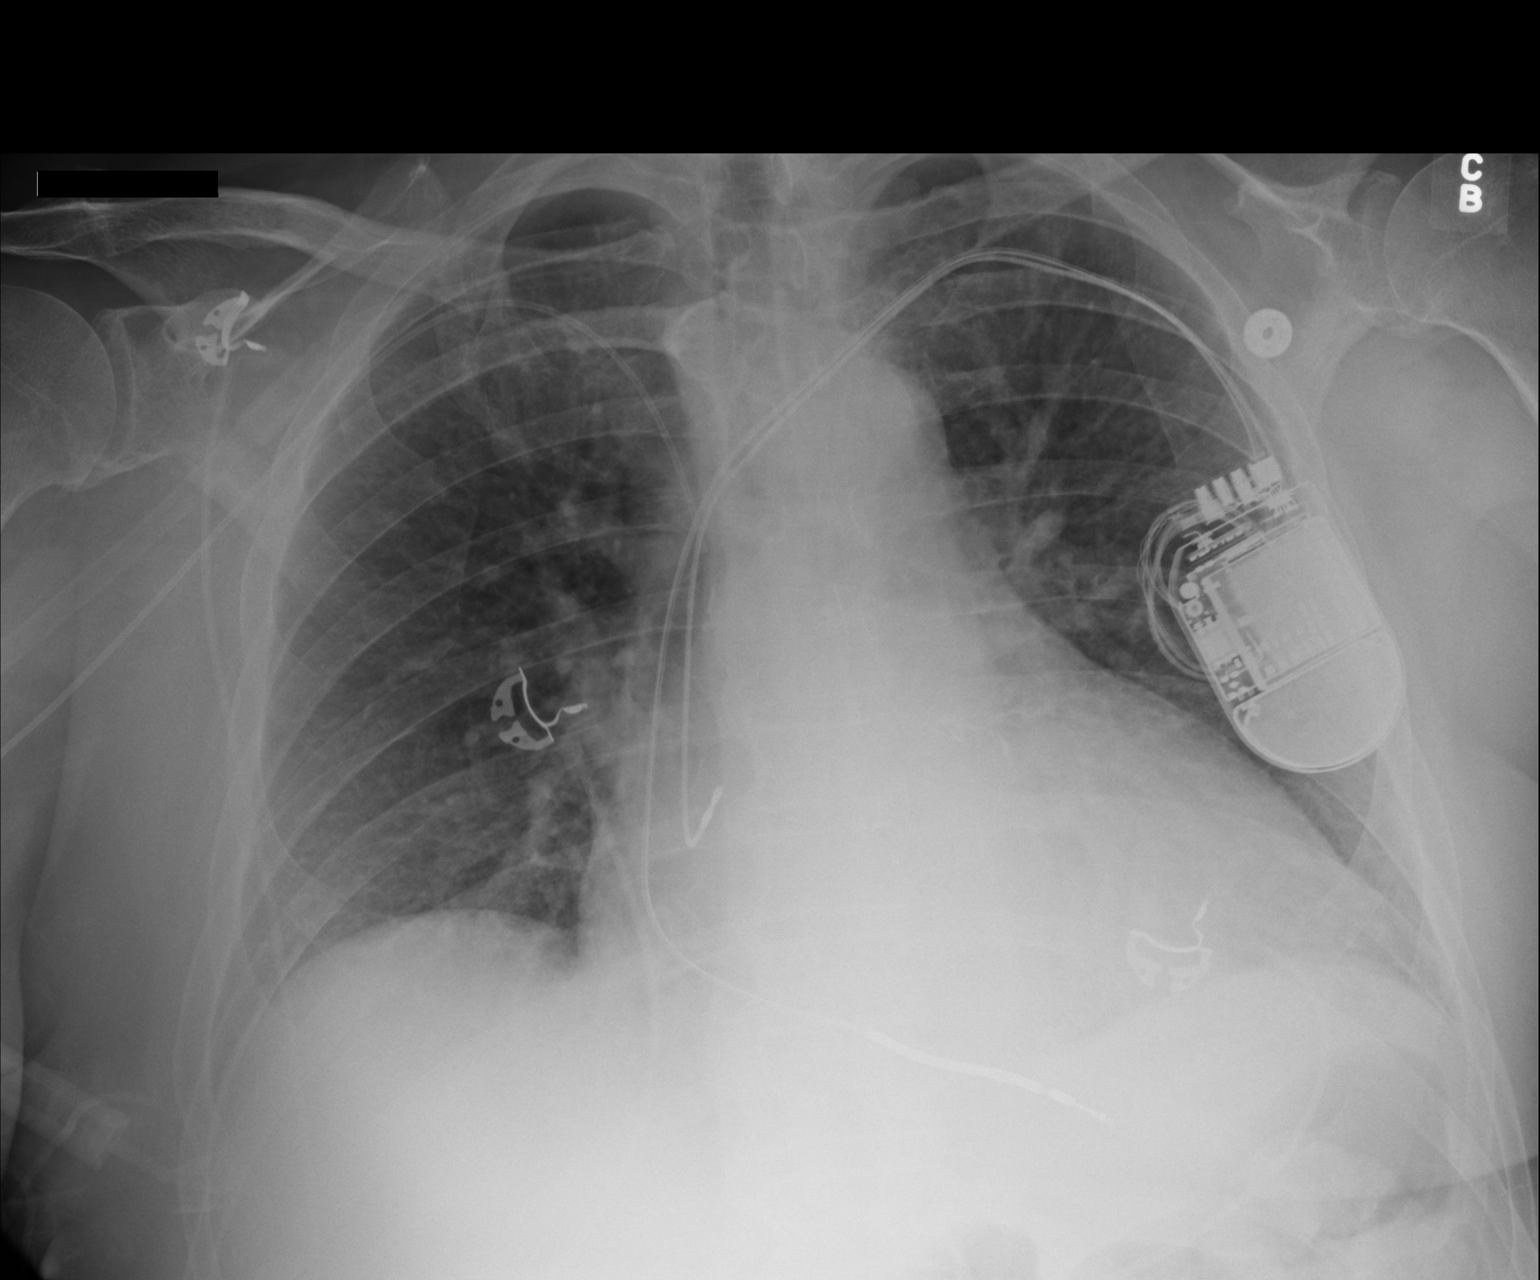

[1 of 1 positions shown; findings below may reference images not displayed]

FINDINGS: Cardiac shadow is enlarged. Pacing device is again seen and stable.
A right-sided PICC line is again noted in the mid superior vena
cava. The lungs are well aerated bilaterally without focal
infiltrate or sizable effusion. No acute bony abnormality is seen.
IMPRESSION: No acute abnormality noted.  No change from the prior exam.

## 2016-10-02 IMAGING — CR DG PORTABLE PELVIS
1 series · 1 of 1 positions shown · non-contrast
Comparison: Portable exam 5944 hours compared intraoperative images
of 08/16/2014

CLINICAL DATA: LEFT femoral nail surgery, hip fracture

EXAM:
PORTABLE PELVIS 1-2 VIEWS

[AP]
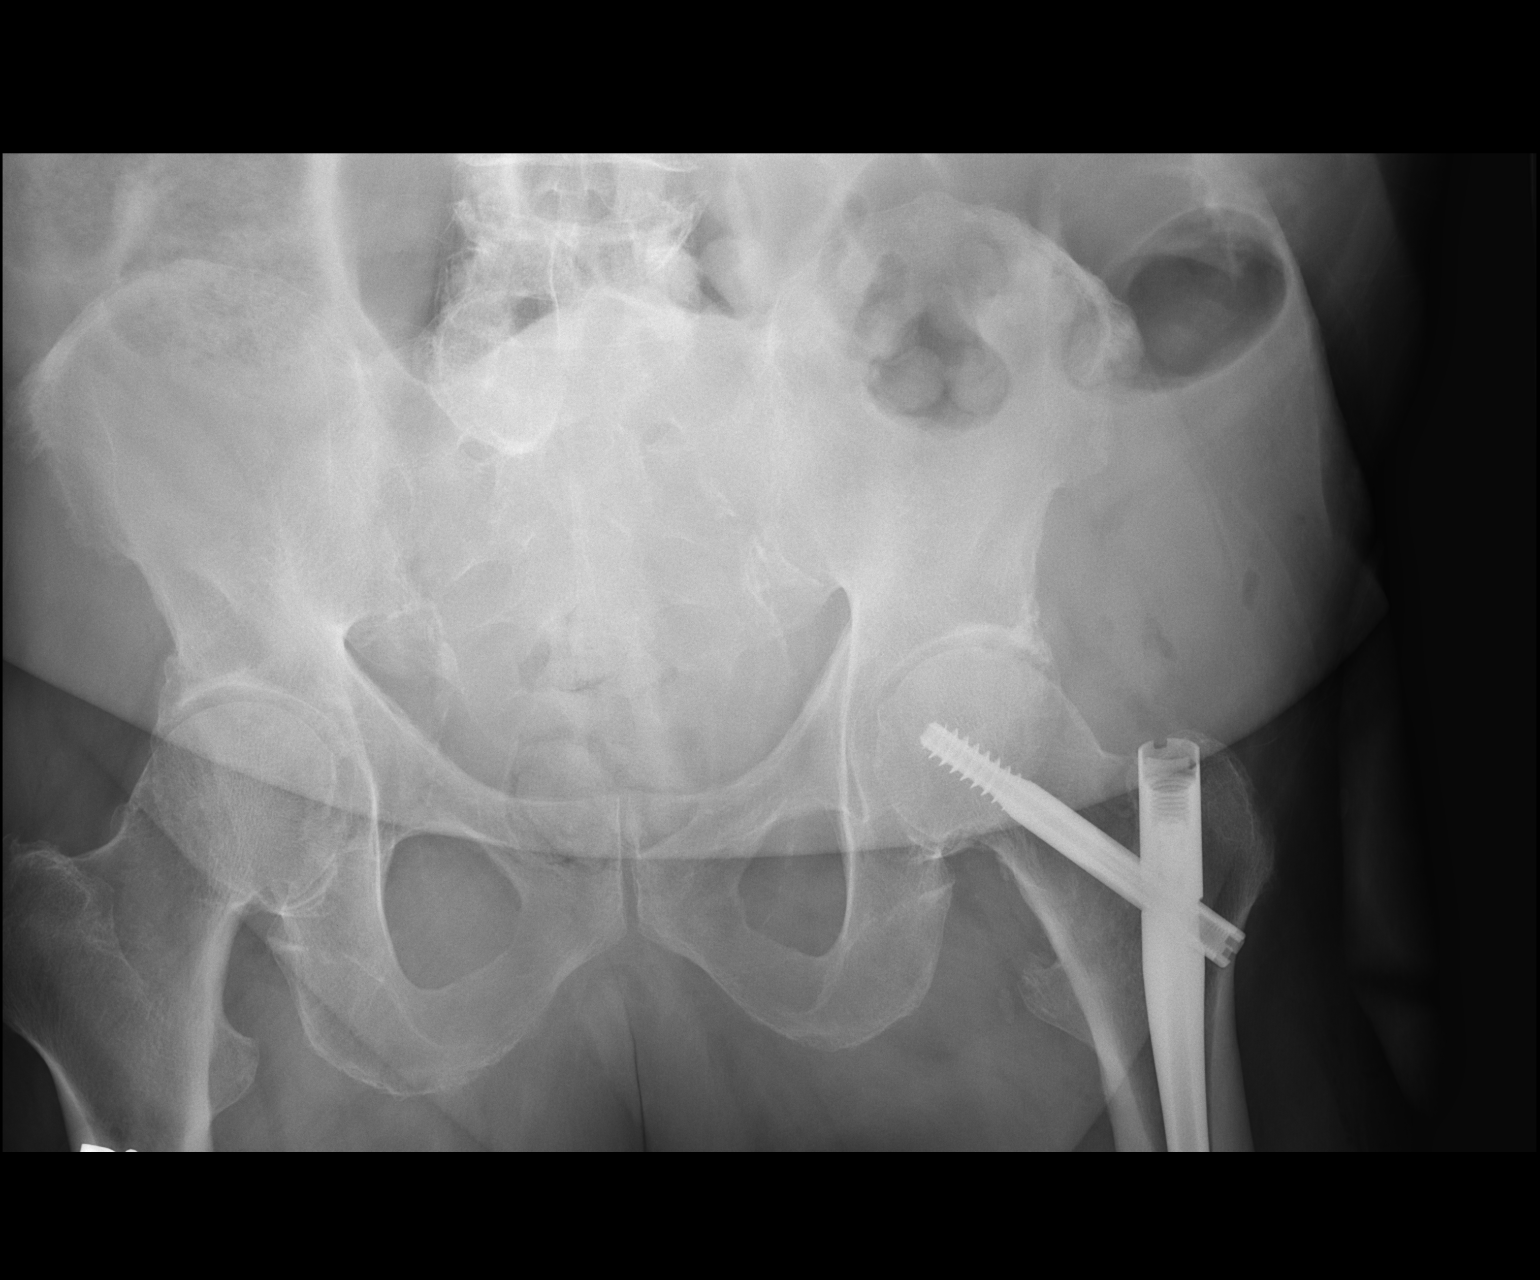

[1 of 1 positions shown; findings below may reference images not displayed]

FINDINGS: IM nail with compression screw at proximal LEFT femur post ORIF of a
nondisplaced LEFT femoral neck fracture.

Distal extent of hardware not imaged.

Hip joint spaces mildly narrowed bilaterally.

Pelvis intact.

No additional fractures identified.
IMPRESSION: Post ORIF proximal LEFT femur.

## 2016-10-02 IMAGING — RF DG C-ARM 61-120 MIN
1 series · 3 of 3 positions shown · non-contrast
Comparison: None.

CLINICAL DATA: ORIF left femur.

EXAM:
DG C-ARM 61-120 MIN

[Series 1: run · 3 of 3 slices shown]
[im 1/3]
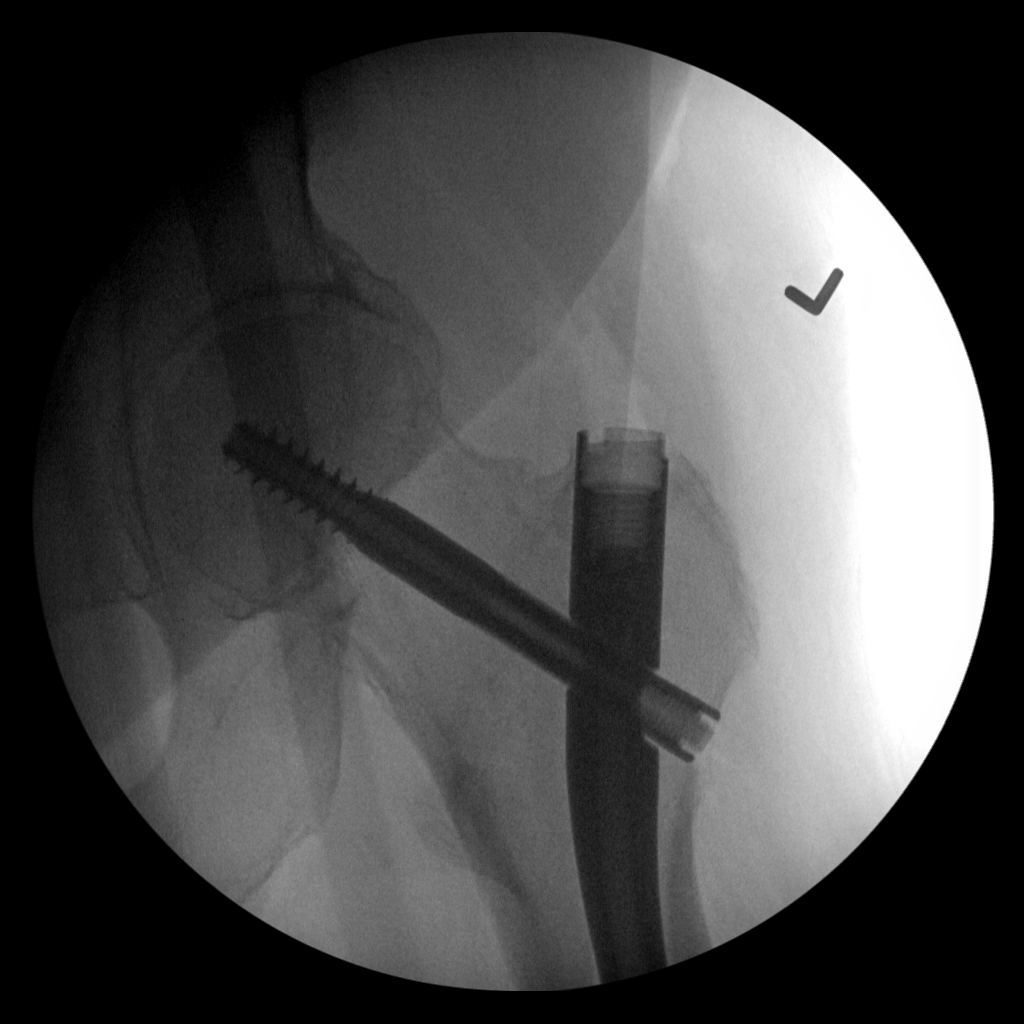
[im 2/3]
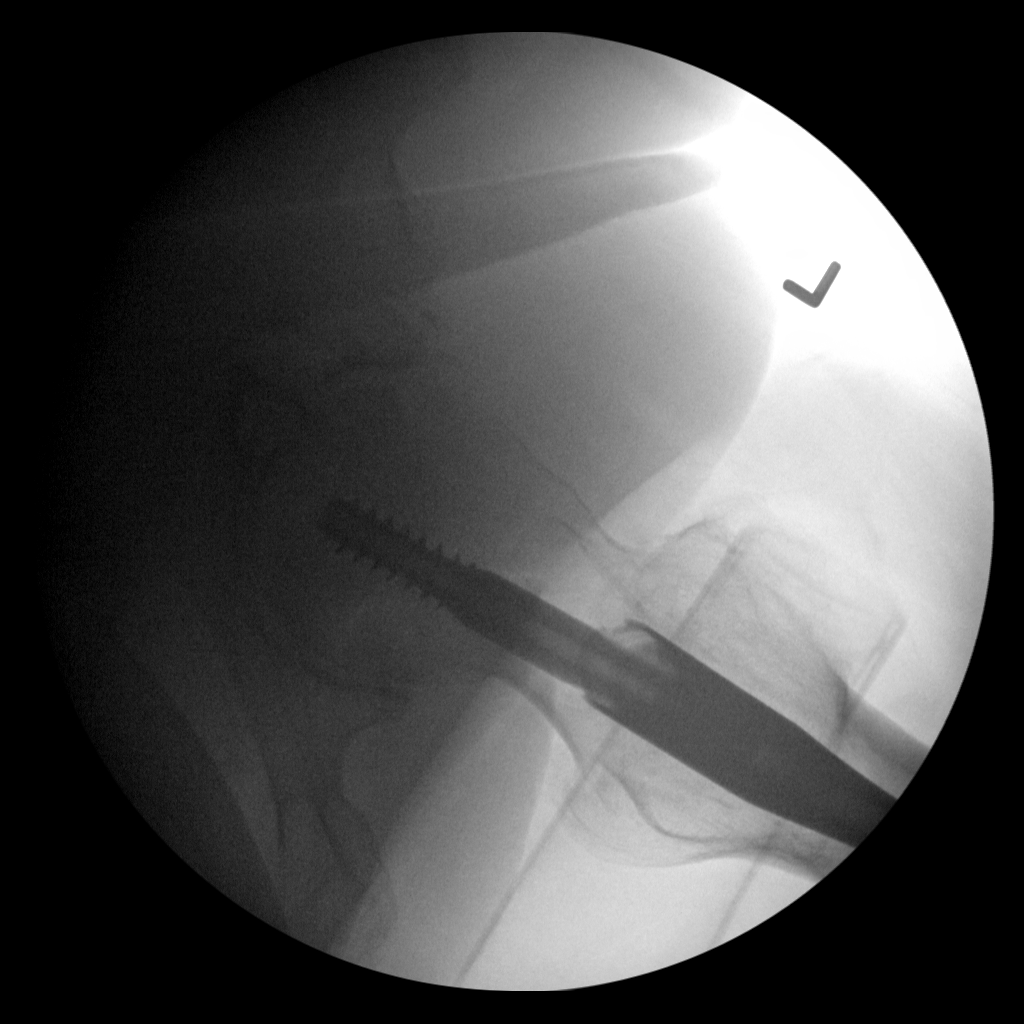
[im 3/3]
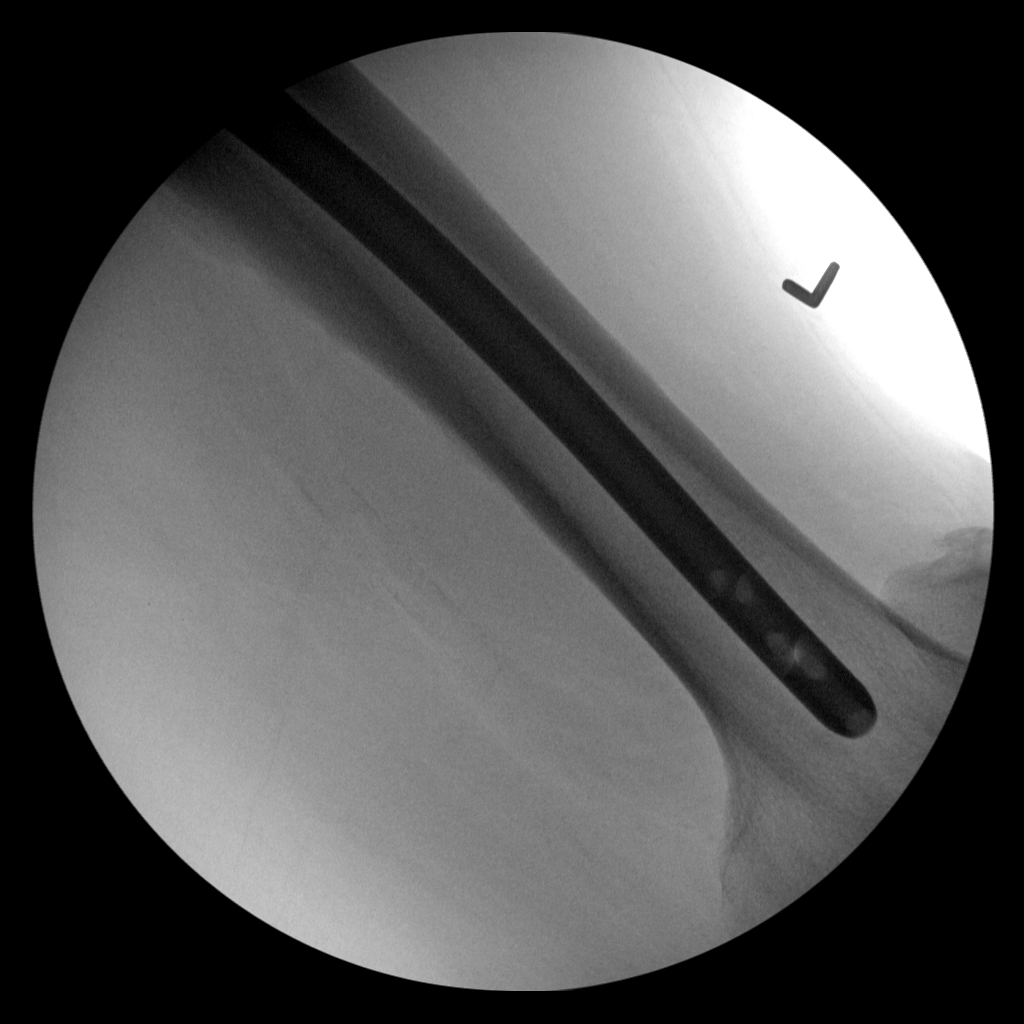

[3 of 3 positions shown; findings below may reference images not displayed]

FINDINGS: ORIF left femur. Good anatomic alignment. 0 minutes 55 seconds
fluoroscopic time. Three images.

## 2016-10-05 IMAGING — CR DG CHEST 1V PORT
1 series · 1 of 1 positions shown · non-contrast
Comparison: Chest radiograph 08/25/2014

CLINICAL DATA: Patient with congestive heart failure.

EXAM:
PORTABLE CHEST - 1 VIEW

[AP]
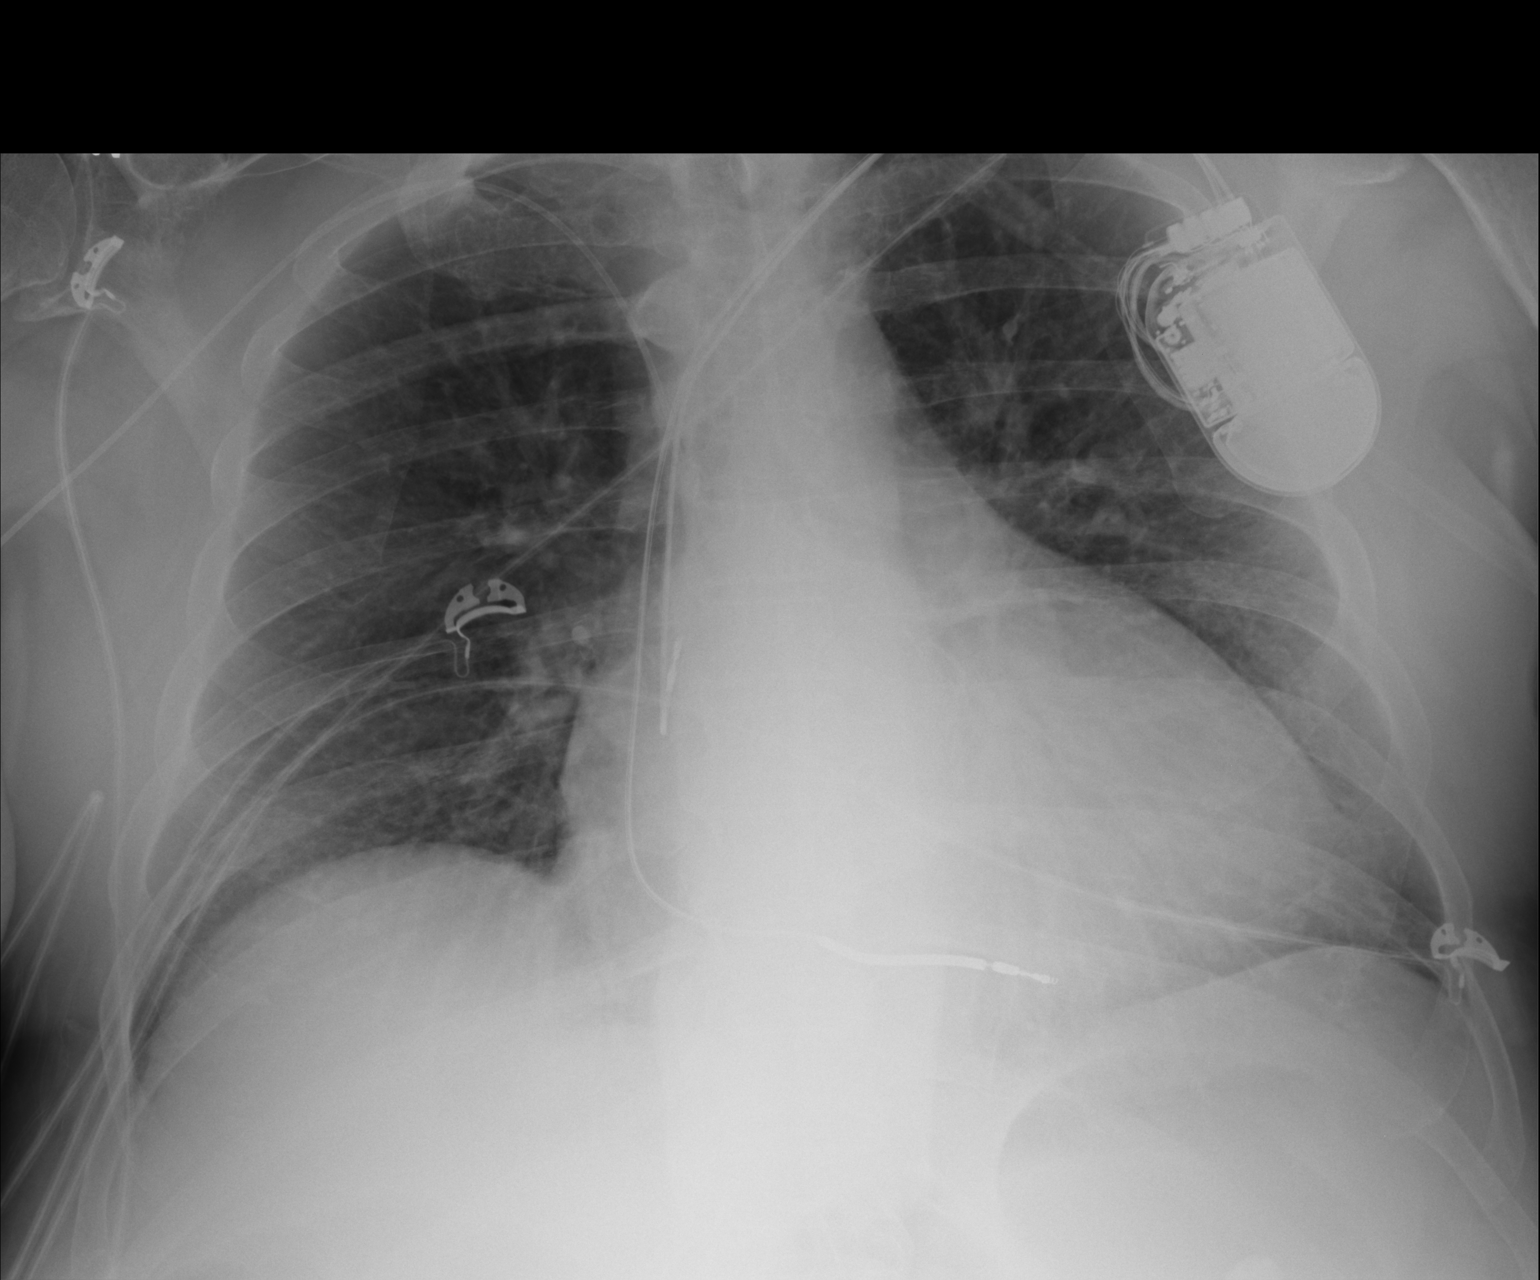

[1 of 1 positions shown; findings below may reference images not displayed]

FINDINGS: Multi lead pacer apparatus overlies the left hemi thorax, leads are
stable in position. Right upper extremity PICC line tip projects
over the superior vena cava. Monitoring leads project over the
patient. Stable cardiomegaly. No consolidative pulmonary opacities.
No pleural effusion or pneumothorax. Regional skeleton is
unremarkable.
IMPRESSION: No acute cardiopulmonary process.
